# Patient Record
Sex: Female | Born: 1960 | Race: Black or African American | Hispanic: No | Marital: Married | Smoking: Current every day smoker
Health system: Southern US, Community
[De-identification: ages and names within clinical notes are randomized; demographics above are authoritative.]

## PROBLEM LIST (undated history)

## (undated) DIAGNOSIS — N189 Chronic kidney disease, unspecified: Secondary | ICD-10-CM

## (undated) DIAGNOSIS — D649 Anemia, unspecified: Secondary | ICD-10-CM

## (undated) DIAGNOSIS — I509 Heart failure, unspecified: Secondary | ICD-10-CM

## (undated) DIAGNOSIS — F32A Depression, unspecified: Secondary | ICD-10-CM

## (undated) DIAGNOSIS — I10 Essential (primary) hypertension: Secondary | ICD-10-CM

## (undated) DIAGNOSIS — E7800 Pure hypercholesterolemia, unspecified: Secondary | ICD-10-CM

## (undated) DIAGNOSIS — Z720 Tobacco use: Secondary | ICD-10-CM

## (undated) DIAGNOSIS — M1990 Unspecified osteoarthritis, unspecified site: Secondary | ICD-10-CM

## (undated) DIAGNOSIS — R519 Headache, unspecified: Secondary | ICD-10-CM

## (undated) DIAGNOSIS — M1120 Other chondrocalcinosis, unspecified site: Secondary | ICD-10-CM

## (undated) HISTORY — PX: OVARY SURGERY: SHX727

## (undated) HISTORY — PX: TUBAL LIGATION: SHX77

## (undated) HISTORY — DX: Essential (primary) hypertension: I10

## (undated) HISTORY — PX: CATARACT EXTRACTION: SUR2

## (undated) HISTORY — PX: ANTERIOR CRUCIATE LIGAMENT REPAIR: SHX115

## (undated) HISTORY — PX: RIGHT OOPHORECTOMY: SHX2359

## (undated) HISTORY — PX: SMALL INTESTINE SURGERY: SHX150

---

## 2000-03-20 ENCOUNTER — Encounter: Payer: Self-pay | Admitting: Orthopedic Surgery

## 2000-03-20 ENCOUNTER — Encounter: Admission: RE | Admit: 2000-03-20 | Discharge: 2000-03-20 | Payer: Self-pay | Admitting: Orthopedic Surgery

## 2010-07-11 ENCOUNTER — Encounter: Payer: Self-pay | Admitting: Family Medicine

## 2010-07-11 ENCOUNTER — Ambulatory Visit: Payer: Self-pay | Admitting: Family Medicine

## 2010-07-11 LAB — CONVERTED CEMR LAB
HCT: 39.2 % (ref 36.0–46.0)
Hemoglobin: 12.8 g/dL (ref 12.0–15.0)
Hgb A1c MFr Bld: 8.1 % — ABNORMAL HIGH (ref <5.7)
MCHC: 32.7 g/dL (ref 30.0–36.0)
MCV: 86.9 fL (ref 78.0–100.0)
Platelets: 297 10*3/uL (ref 150–400)
RBC: 4.51 M/uL (ref 3.87–5.11)
RDW: 14.4 % (ref 11.5–15.5)
TSH: 1.311 microintl units/mL (ref 0.350–4.500)
WBC: 10.2 10*3/uL (ref 4.0–10.5)

## 2010-07-18 ENCOUNTER — Ambulatory Visit (HOSPITAL_COMMUNITY): Admission: RE | Admit: 2010-07-18 | Payer: Self-pay | Source: Home / Self Care | Admitting: Family Medicine

## 2010-08-02 ENCOUNTER — Ambulatory Visit: Admit: 2010-08-02 | Payer: Self-pay | Admitting: Family Medicine

## 2010-08-19 ENCOUNTER — Encounter: Payer: Self-pay | Admitting: Family Medicine

## 2011-09-29 ENCOUNTER — Other Ambulatory Visit: Payer: Self-pay

## 2011-09-29 ENCOUNTER — Encounter (HOSPITAL_BASED_OUTPATIENT_CLINIC_OR_DEPARTMENT_OTHER): Payer: Self-pay | Admitting: *Deleted

## 2011-09-29 ENCOUNTER — Emergency Department (HOSPITAL_BASED_OUTPATIENT_CLINIC_OR_DEPARTMENT_OTHER)
Admission: EM | Admit: 2011-09-29 | Discharge: 2011-09-29 | Disposition: A | Discharge status: Home / Self Care | Payer: Self-pay | Attending: Emergency Medicine | Admitting: Emergency Medicine

## 2011-09-29 ENCOUNTER — Emergency Department (INDEPENDENT_AMBULATORY_CARE_PROVIDER_SITE_OTHER): Payer: Self-pay

## 2011-09-29 DIAGNOSIS — R197 Diarrhea, unspecified: Secondary | ICD-10-CM | POA: Insufficient documentation

## 2011-09-29 DIAGNOSIS — K219 Gastro-esophageal reflux disease without esophagitis: Secondary | ICD-10-CM

## 2011-09-29 DIAGNOSIS — R110 Nausea: Secondary | ICD-10-CM | POA: Insufficient documentation

## 2011-09-29 DIAGNOSIS — R040 Epistaxis: Secondary | ICD-10-CM | POA: Insufficient documentation

## 2011-09-29 DIAGNOSIS — R1915 Other abnormal bowel sounds: Secondary | ICD-10-CM | POA: Insufficient documentation

## 2011-09-29 DIAGNOSIS — E119 Type 2 diabetes mellitus without complications: Secondary | ICD-10-CM | POA: Insufficient documentation

## 2011-09-29 DIAGNOSIS — E7800 Pure hypercholesterolemia, unspecified: Secondary | ICD-10-CM | POA: Insufficient documentation

## 2011-09-29 DIAGNOSIS — R61 Generalized hyperhidrosis: Secondary | ICD-10-CM | POA: Insufficient documentation

## 2011-09-29 DIAGNOSIS — R739 Hyperglycemia, unspecified: Secondary | ICD-10-CM

## 2011-09-29 DIAGNOSIS — R079 Chest pain, unspecified: Secondary | ICD-10-CM

## 2011-09-29 DIAGNOSIS — Z79899 Other long term (current) drug therapy: Secondary | ICD-10-CM | POA: Insufficient documentation

## 2011-09-29 HISTORY — DX: Pure hypercholesterolemia, unspecified: E78.00

## 2011-09-29 LAB — URINALYSIS, ROUTINE W REFLEX MICROSCOPIC
Leukocytes, UA: NEGATIVE
Nitrite: POSITIVE — AB
Protein, ur: NEGATIVE mg/dL
Specific Gravity, Urine: 1.045 — ABNORMAL HIGH (ref 1.005–1.030)
Urobilinogen, UA: 0.2 mg/dL (ref 0.0–1.0)

## 2011-09-29 LAB — DIFFERENTIAL
Basophils Absolute: 0 10*3/uL (ref 0.0–0.1)
Basophils Relative: 0 % (ref 0–1)
Eosinophils Absolute: 0.1 10*3/uL (ref 0.0–0.7)
Monocytes Absolute: 0.6 10*3/uL (ref 0.1–1.0)
Monocytes Relative: 6 % (ref 3–12)
Neutro Abs: 5.9 10*3/uL (ref 1.7–7.7)
Neutrophils Relative %: 63 % (ref 43–77)

## 2011-09-29 LAB — TROPONIN I: Troponin I: <0.3 ng/mL (ref <0.30)

## 2011-09-29 LAB — CBC
Hemoglobin: 14.5 g/dL (ref 12.0–15.0)
MCH: 28.5 pg (ref 26.0–34.0)
MCHC: 34 g/dL (ref 30.0–36.0)
RDW: 14.1 % (ref 11.5–15.5)

## 2011-09-29 LAB — COMPREHENSIVE METABOLIC PANEL
CO2: 25 mEq/L (ref 19–32)
Calcium: 9.5 mg/dL (ref 8.4–10.5)
Creatinine, Ser: 0.6 mg/dL (ref 0.50–1.10)
GFR calc Af Amer: >90 mL/min (ref >90)
GFR calc non Af Amer: >90 mL/min (ref >90)
Glucose, Bld: 446 mg/dL — ABNORMAL HIGH (ref 70–99)
Total Protein: 7.4 g/dL (ref 6.0–8.3)

## 2011-09-29 LAB — GLUCOSE, CAPILLARY
Glucose-Capillary: 448 mg/dL — ABNORMAL HIGH (ref 70–99)
Glucose-Capillary: 310 mg/dL — ABNORMAL HIGH (ref 70–99)
Glucose-Capillary: 209 mg/dL — ABNORMAL HIGH (ref 70–99)

## 2011-09-29 LAB — URINE MICROSCOPIC-ADD ON

## 2011-09-29 IMAGING — CR DG CHEST 2V
2 series · 2 of 2 positions shown · non-contrast
Comparison: None.

CLINICAL DATA: Chest pain.  Hyperglycemia.  Diabetes.

CHEST - 2 VIEW

[w chest pa]
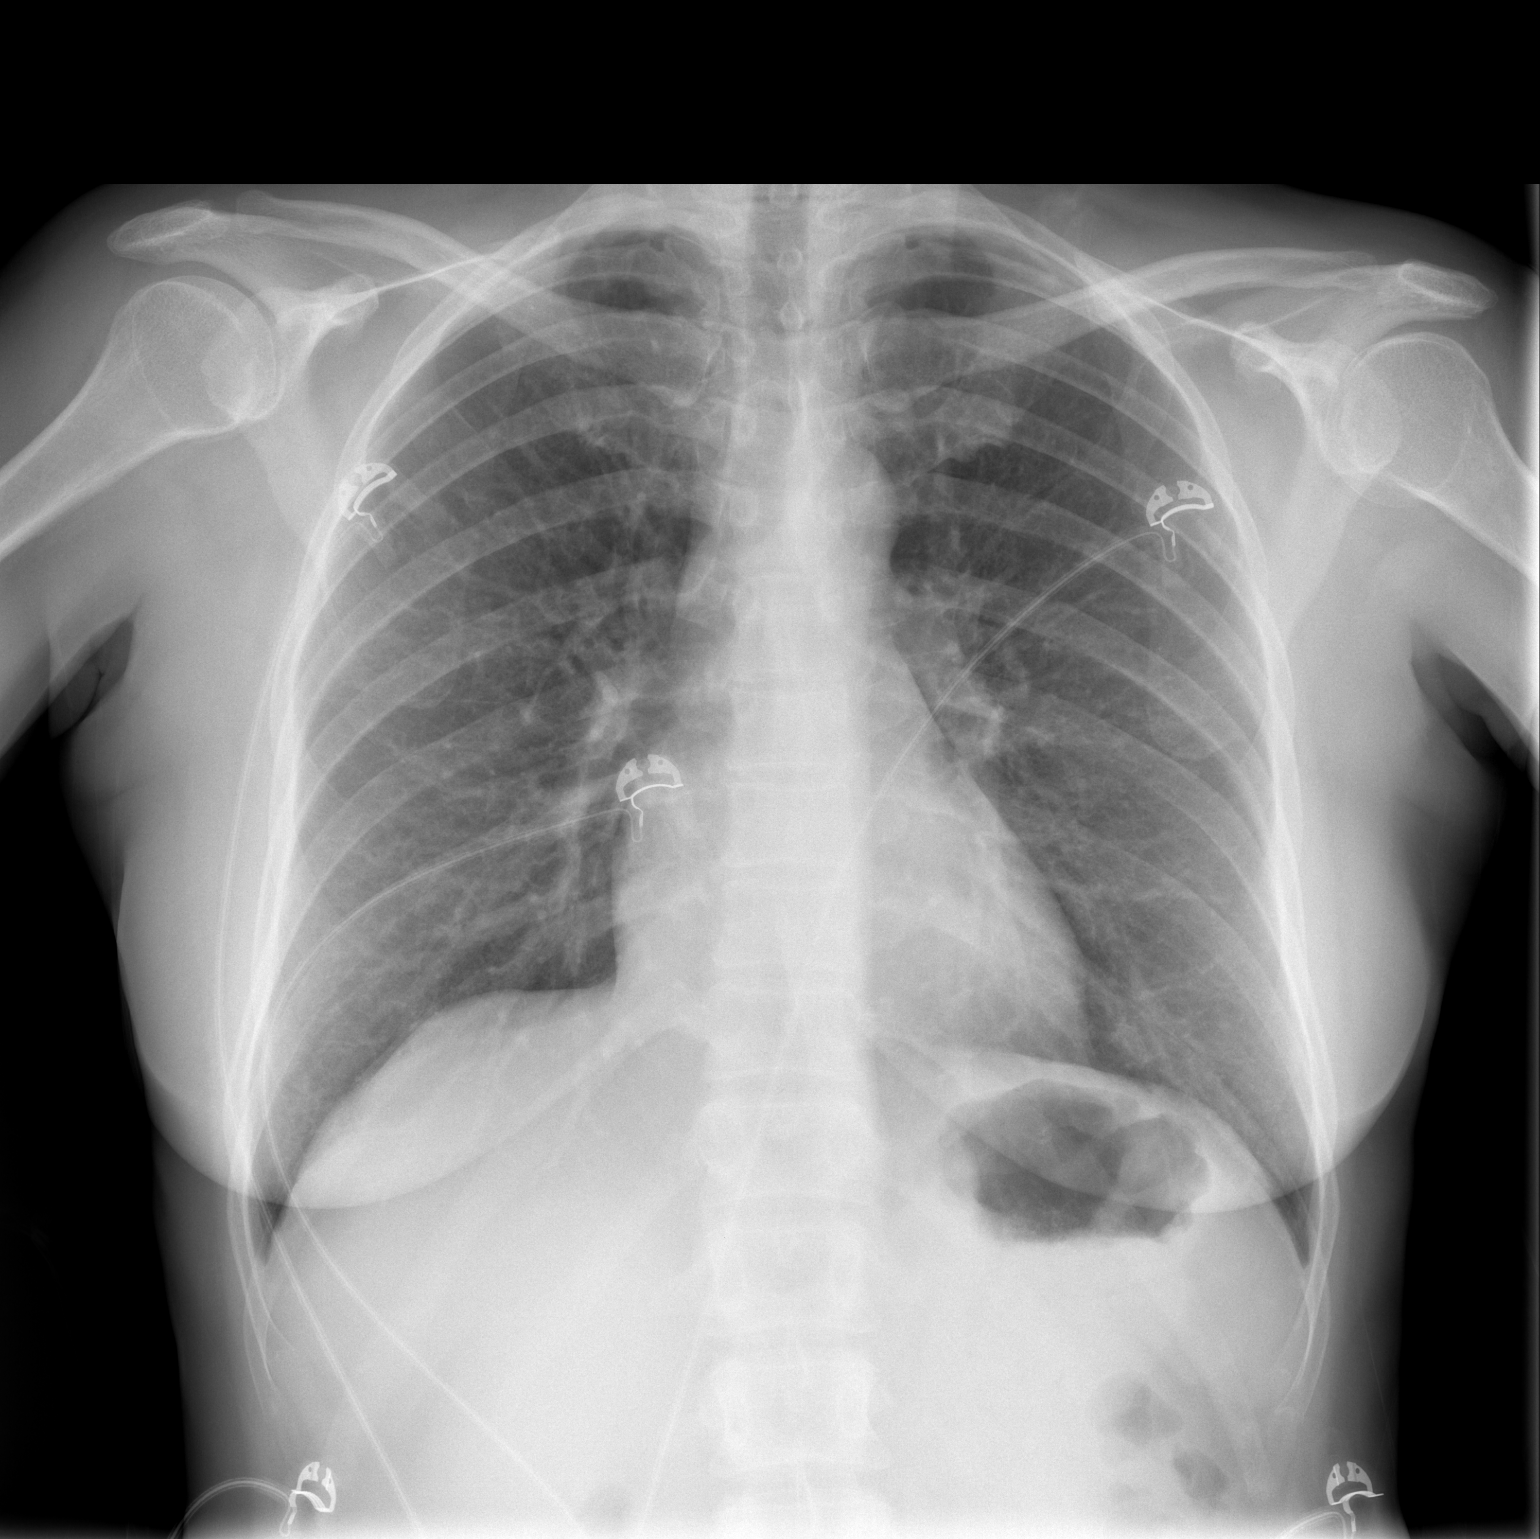

[w chest lat]
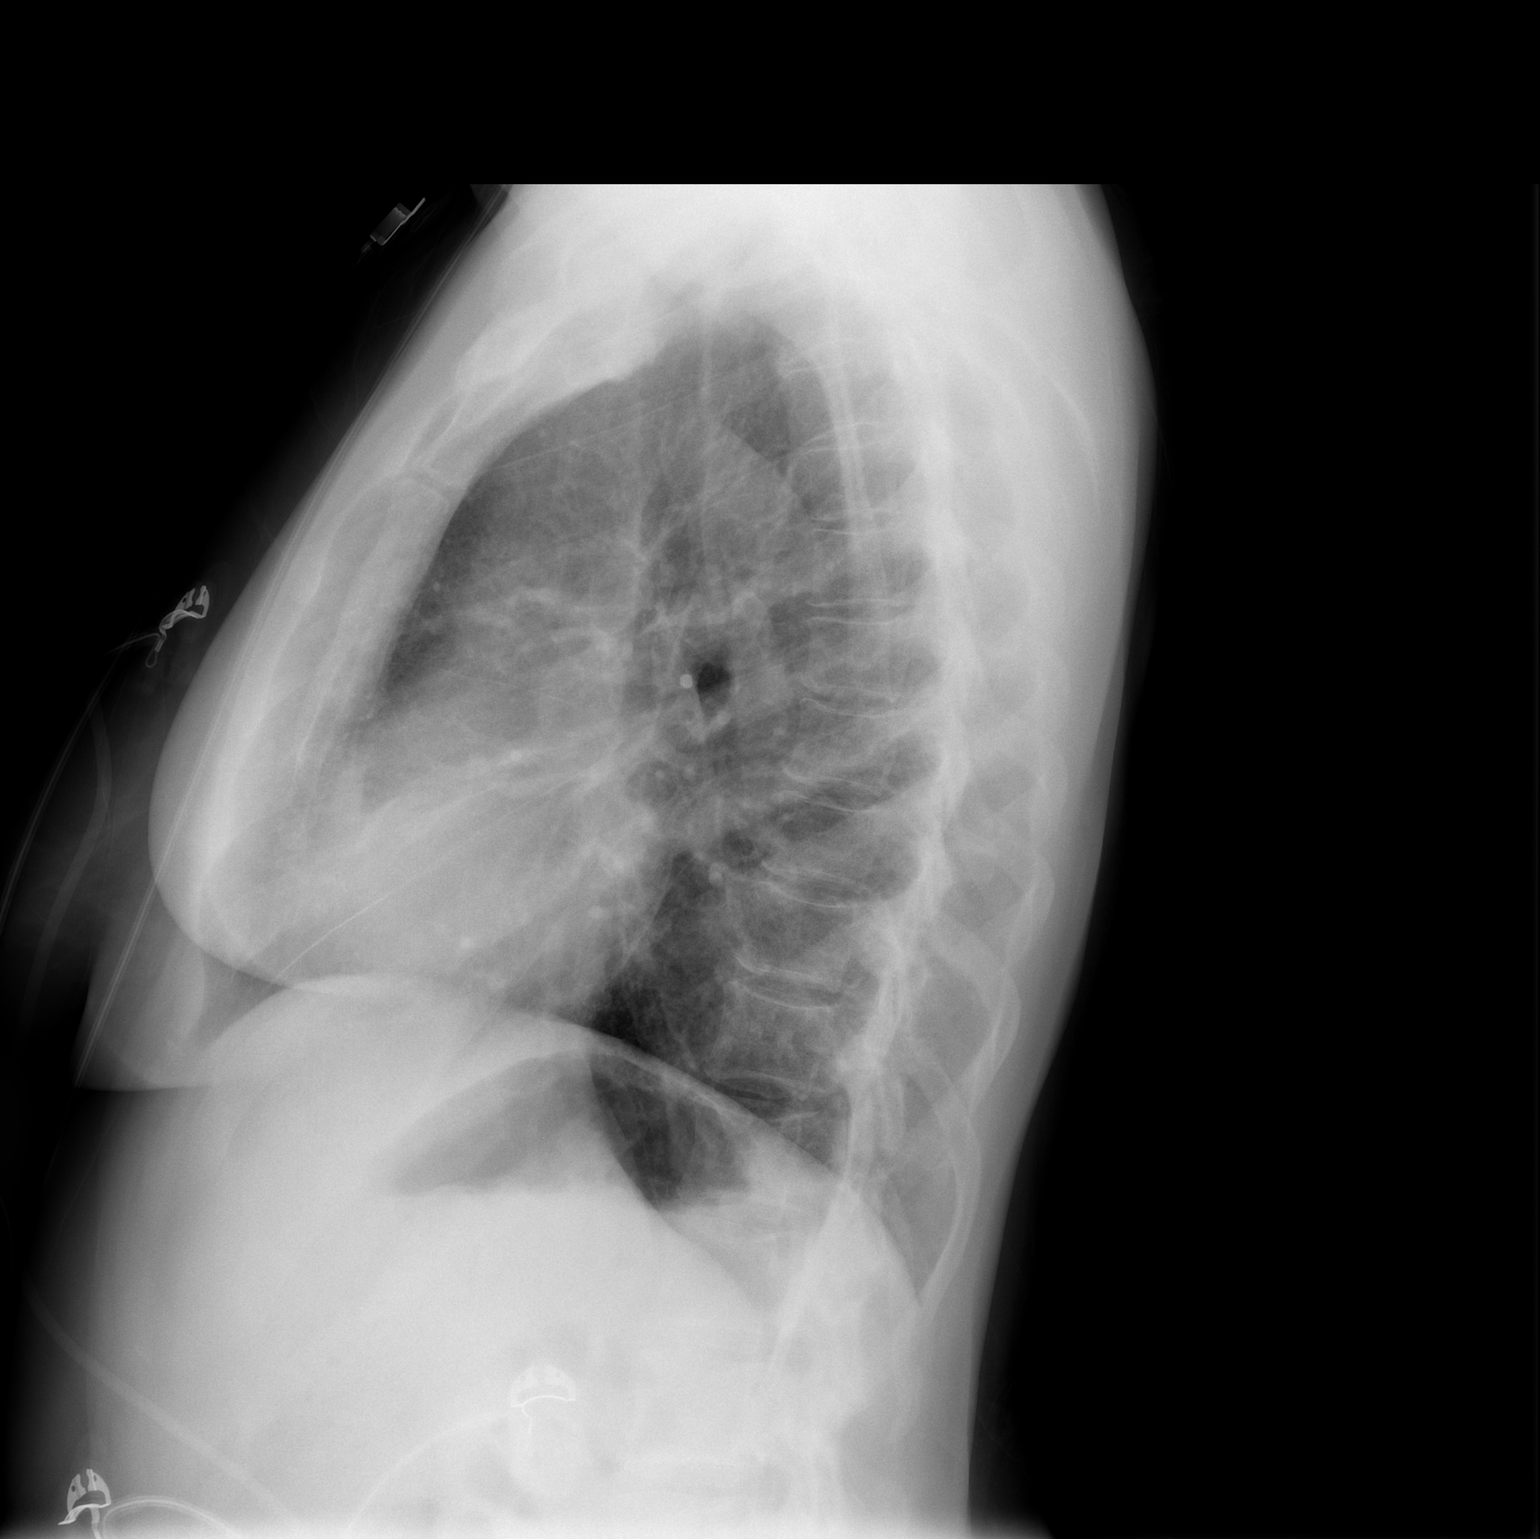

[2 of 2 positions shown; findings below may reference images not displayed]

FINDINGS: The heart size and mediastinal contours are normal. The
lungs are clear. There is no pleural effusion or pneumothorax. No
acute osseous findings are identified.  Telemetry leads overlie the
chest
IMPRESSION: No active cardiopulmonary process.

## 2011-09-29 MED ORDER — GI COCKTAIL ~~LOC~~
30.0000 mL | Freq: Once | ORAL | Status: AC
  Administered 2011-09-29: 30 mL via ORAL
  Filled 2011-09-29: qty 30

## 2011-09-29 MED ORDER — FAMOTIDINE IN NACL 20-0.9 MG/50ML-% IV SOLN
20.0000 mg | Freq: Once | INTRAVENOUS | Status: AC
  Administered 2011-09-29: 20 mg via INTRAVENOUS
  Filled 2011-09-29: qty 50

## 2011-09-29 MED ORDER — SODIUM CHLORIDE 0.9 % IV BOLUS (SEPSIS)
1000.0000 mL | Freq: Once | INTRAVENOUS | Status: AC
  Administered 2011-09-29: 1000 mL via INTRAVENOUS

## 2011-09-29 MED ORDER — ONDANSETRON HCL 8 MG PO TABS: 8.0000 mg | ORAL_TABLET | Freq: Three times a day (TID) | ORAL | Status: AC | PRN

## 2011-09-29 MED ORDER — FAMOTIDINE 20 MG PO TABS: 20.0000 mg | ORAL_TABLET | Freq: Two times a day (BID) | ORAL | Status: DC | PRN

## 2011-09-29 MED ORDER — SODIUM CHLORIDE 0.9 % IV SOLN: INTRAVENOUS | Status: DC

## 2011-09-29 MED ORDER — SODIUM CHLORIDE 0.9 % IV SOLN
INTRAVENOUS | Status: DC
  Administered 2011-09-29: 2.5 [IU]/h via INTRAVENOUS
  Filled 2011-09-29: qty 100
  Filled 2011-09-29: qty 6

## 2011-09-29 MED ORDER — ASPIRIN 81 MG PO CHEW
324.0000 mg | CHEWABLE_TABLET | Freq: Once | ORAL | Status: AC
  Administered 2011-09-29: 324 mg via ORAL
  Filled 2011-09-29: qty 4

## 2011-09-29 MED ORDER — METFORMIN HCL 500 MG PO TABS: 500.0000 mg | ORAL_TABLET | Freq: Two times a day (BID) | ORAL | Status: DC

## 2011-09-29 MED ORDER — ONDANSETRON HCL 4 MG/2ML IJ SOLN
4.0000 mg | Freq: Once | INTRAMUSCULAR | Status: AC
  Administered 2011-09-29: 4 mg via INTRAVENOUS
  Filled 2011-09-29: qty 2

## 2011-09-29 NOTE — ED Notes (Signed)
Patient transported to X-ray 

## 2011-09-29 NOTE — ED Provider Notes (Signed)
History   This chart was scribed for Charlena Cross, MD by Malen Gauze. The patient was seen in room [place] and the patient's care was started at 4:40PM.    CSN: [id number]  Arrival date & time 09/29/11  1537   First MD Initiated Contact with Patient 09/29/11 1625      Chief Complaint  Patient presents with  . Chest Pain    (Consider location/radiation/quality/duration/timing/severity/associated sxs/prior treatment) HPI Amber Cross is a 51 y.o. female who presents to the Emergency Department complaining of constant, stabbing, burning, non-radiating, moderate to severe chest pain with associated acid-reflux symptoms with an onset noon yesterday. Drinking soda alleviated the pain, but it came back soon after. Slight diaphoresis, nausea, diarrhea, epistaxis (this morning). No HA, SOB, abd pain, vomit, hematochezia, or extremity pain. Pt has a Hx of diabetes mellitus but has been without meds or f/u for 2-3 yrs; pt ran out of metformin 2-3 years ago. No known allergies to medications. No other pertinent medical symptoms.  Past Medical History  Diagnosis Date  . Diabetes mellitus   . Hypercholesteremia     Past Surgical History  Procedure Date  . Ovary surgery     History reviewed. No pertinent family history.  History  Substance Use Topics  . Smoking status: Current Some Day Smoker  . Smokeless tobacco: Not on file  . Alcohol Use: No    OB History    Grav Para Term Preterm Abortions TAB SAB Ect Mult Living                  Review of Systems 10 Systems reviewed and are negative for acute change except as noted in the HPI.  Allergies  Review of patient's allergies indicates no known allergies.  Home Medications   Current Outpatient Rx  Name Route Sig Dispense Refill  . BC HEADACHE POWDER PO Oral Take 1 packet by mouth once.    . METFORMIN HCL 500 MG PO TABS Oral Take 500 mg by mouth once.      BP 124/61  Pulse 66  Temp(Src) 98.2 F (36.8 C) (Oral)   Resp 20  Ht 5' 7.5" (1.715 m)  Wt 164 lb (74.39 kg)  BMI 25.31 kg/m2  SpO2 98%  LMP 09/29/2011  Physical Exam  Nursing note and vitals reviewed. Constitutional: She is oriented to person, place, and time. She appears well-developed and well-nourished. No distress.       Awake, alert, nontoxic appearance.  HENT:  Head: Normocephalic and atraumatic.  Right Ear: External ear normal.  Left Ear: External ear normal.  Mouth/Throat: Oropharynx is clear and moist.  Eyes: Conjunctivae and EOM are normal. Pupils are equal, round, and reactive to light. Right eye exhibits no discharge. Left eye exhibits no discharge.  Neck: Normal range of motion. Neck supple.       No jugular vein distension.  Cardiovascular: Normal rate, regular rhythm, normal heart sounds and intact distal pulses.  Exam reveals no gallop and no friction rub.   No murmur heard. Pulmonary/Chest: Effort normal and breath sounds normal. No respiratory distress. She has no wheezes. She has no rales. She exhibits no tenderness.       Good air exchange.  Abdominal: Soft. She exhibits no distension. There is no tenderness. There is no rebound and no guarding.       Hypoactive bowel sounds.  Musculoskeletal: Normal range of motion. She exhibits no edema and no tenderness.       Baseline ROM, no obvious  new focal weakness.  Lymphadenopathy:    She has no cervical adenopathy.  Neurological: She is alert and oriented to person, place, and time.       Mental status and motor strength appears baseline for patient and situation.  Skin: Skin is warm. No rash noted. She is diaphoretic (Slightly).  Psychiatric: She has a normal mood and affect. Her behavior is normal.    ED Course  Procedures (including critical care time)   Date: 09/29/2011  Rate: 88  Rhythm: normal sinus rhythm  QRS Axis: normal  Intervals: normal  ST/T Wave abnormalities: flattened t waves diffusely  Conduction Disutrbances: none  Narrative Interpretation:  Non-provocative EKG  Old EKG Reviewed: Unavailable     DIAGNOSTIC STUDIES: Oxygen Saturation is 98% on room air, normal by my interpretation.    COORDINATION OF CARE:  4:45PM -  EDMD will prescribe more metformin and order insulin. EDMD will also give a list of MDs for pt to f/u with for diabetes, especially ones without preference to lack of insurance.  Results for orders placed during the hospital encounter of 09/29/11  GLUCOSE, CAPILLARY      Component Value Range   Glucose-Capillary 448 (*) 70 - 99 (mg/dL)  TROPONIN I      Component Value Range   Troponin I <0.30  <0.30 (ng/mL)  CBC      Component Value Range   WBC 9.5  4.0 - 10.5 (K/uL)   RBC 5.09  3.87 - 5.11 (MIL/uL)   Hemoglobin 14.5  12.0 - 15.0 (g/dL)   HCT 42.6  36.0 - 46.0 (%)   MCV 83.7  78.0 - 100.0 (fL)   MCH 28.5  26.0 - 34.0 (pg)   MCHC 34.0  30.0 - 36.0 (g/dL)   RDW 14.1  11.5 - 15.5 (%)   Platelets 296  150 - 400 (K/uL)  DIFFERENTIAL      Component Value Range   Neutrophils Relative 63  43 - 77 (%)   Neutro Abs 5.9  1.7 - 7.7 (K/uL)   Lymphocytes Relative 30  12 - 46 (%)   Lymphs Abs 2.9  0.7 - 4.0 (K/uL)   Monocytes Relative 6  3 - 12 (%)   Monocytes Absolute 0.6  0.1 - 1.0 (K/uL)   Eosinophils Relative 1  0 - 5 (%)   Eosinophils Absolute 0.1  0.0 - 0.7 (K/uL)   Basophils Relative 0  0 - 1 (%)   Basophils Absolute 0.0  0.0 - 0.1 (K/uL)   WBC Morphology VACUOLATED NEUTROPHILS     RBC Morphology TEARDROP CELLS    COMPREHENSIVE METABOLIC PANEL      Component Value Range   Sodium 135  135 - 145 (mEq/L)   Potassium 3.6  3.5 - 5.1 (mEq/L)   Chloride 97  96 - 112 (mEq/L)   CO2 25  19 - 32 (mEq/L)   Glucose, Bld 446 (*) 70 - 99 (mg/dL)   BUN 7  6 - 23 (mg/dL)   Creatinine, Ser 0.60  0.50 - 1.10 (mg/dL)   Calcium 9.5  8.4 - 10.5 (mg/dL)   Total Protein 7.4  6.0 - 8.3 (g/dL)   Albumin 3.6  3.5 - 5.2 (g/dL)   AST 23  0 - 37 (U/L)   ALT 26  0 - 35 (U/L)   Alkaline Phosphatase 142 (*) 39 - 117 (U/L)     Total Bilirubin 0.2 (*) 0.3 - 1.2 (mg/dL)   GFR calc non Af Amer >90  >90 (  mL/min)   GFR calc Af Amer >90  >90 (mL/min)  LIPASE, BLOOD      Component Value Range   Lipase 31  11 - 59 (U/L)  URINALYSIS, ROUTINE W REFLEX MICROSCOPIC      Component Value Range   Color, Urine YELLOW  YELLOW    APPearance CLOUDY (*) CLEAR    Specific Gravity, Urine 1.045 (*) 1.005 - 1.030    pH 5.5  5.0 - 8.0    Glucose, UA >1000 (*) NEGATIVE (mg/dL)   Hgb urine dipstick LARGE (*) NEGATIVE    Bilirubin Urine NEGATIVE  NEGATIVE    Ketones, ur NEGATIVE  NEGATIVE (mg/dL)   Protein, ur NEGATIVE  NEGATIVE (mg/dL)   Urobilinogen, UA 0.2  0.0 - 1.0 (mg/dL)   Nitrite POSITIVE (*) NEGATIVE    Leukocytes, UA NEGATIVE  NEGATIVE   GLUCOSE, CAPILLARY      Component Value Range   Glucose-Capillary 310 (*) 70 - 99 (mg/dL)   Comment 1 Documented in Chart     Comment 2 Notify RN    GLUCOSE, CAPILLARY      Component Value Range   Glucose-Capillary 286 (*) 70 - 99 (mg/dL)   Comment 1 Notify RN     Comment 2 Documented in Chart    URINE MICROSCOPIC-ADD ON      Component Value Range   Squamous Epithelial / LPF FEW (*) RARE    WBC, UA 3-6  <3 (WBC/hpf)   RBC / HPF 3-6  <3 (RBC/hpf)   Bacteria, UA MANY (*) RARE     Dg Chest 2 View  09/29/2011  *RADIOLOGY REPORT*  Clinical Data: Chest pain.  Hyperglycemia.  Diabetes.  CHEST - 2 VIEW  Comparison: None.  Findings: The heart size and mediastinal contours are normal. The lungs are clear. There is no pleural effusion or pneumothorax. No acute osseous findings are identified.  Telemetry leads overlie the chest  IMPRESSION: No active cardiopulmonary process.  Original Report Authenticated By: Vivia Ewing, M.D.     No diagnosis found.    MDM   4:48PM - Hyperglycemia due to diabetes mellitus, likely gastroparesis with GERD, symptoms very atypical with myocardial infarction, with no Hx of CAD; likely no myocardial ischemia or infarction.  8:42 PM Labs reviewed,  the patient has hyperglycemia without suggestion of ketoacidosis, normal bicarbonate, no significant anion gap, otherwise appropriate electrolytes and renal function. There is no indication of acute cholecystitis, pancreatitis, myocardial infarction or ischemia on the patient's physical examination, or in the laboratory results. I perceive that the process at play is hyperglycemia and T2 untreated diabetes mellitus (for the last 2 years) and likely gastroparesis. We will treat the hyperglycemia with IV fluids and IV insulin to correct it, I will start patient back on metformin, and direct her to followup with primary care. The patient states her understanding of and agreement with the plan of care.  I personally performed the services described in this documentation, which was scribed in my presence. The recorded information has been reviewed and considered.        Charlena Cross, MD 09/29/11 2132

## 2011-09-29 NOTE — ED Notes (Signed)
accucheck glucose 448

## 2011-09-29 NOTE — ED Notes (Signed)
Requesting something to drink. Given water. OK per Dr. Windle Guard.

## 2011-09-29 NOTE — ED Notes (Signed)
Last metformin taken two days ago & does not take on a regular basis because she has run out of meds

## 2011-09-29 NOTE — ED Notes (Addendum)
Pt states she was lying down yesterday and began to have midsternal CP with nausea, diaphoresis and numbness to her right hand. Drank a soda which "made me belch", but it did not help. Felt worse today. Also had a nosebleed this a.m. Hx diabetes and FSBS was 430 this a.m. No period for 5 months and this month started passing clots.

## 2011-09-29 NOTE — ED Notes (Signed)
CBG on the patient was 310.

## 2011-09-29 NOTE — ED Notes (Signed)
Insulin drip started at 2.5units/hour

## 2011-09-29 NOTE — Discharge Instructions (Signed)
Blood Sugar Monitoring, Adult GLUCOSE METERS FOR SELF-MONITORING OF BLOOD GLUCOSE  It is important to be able to correctly measure your blood sugar (glucose). You can use a blood glucose monitor (a small battery-operated device) to check your glucose level at any time. This allows you and your caregiver to monitor your diabetes and to determine how well your treatment plan is working. The process of monitoring your blood glucose with a glucose meter is called self-monitoring of blood glucose (SMBG). When people with diabetes control their blood sugar, they have better health. To test for glucose with a typical glucose meter, place the disposable strip in the meter. Then place a small sample of blood on the "test strip." The test strip is coated with chemicals that combine with glucose in blood. The meter measures how much glucose is present. The meter displays the glucose level as a number. Several new models can record and store a number of test results. Some models can connect to personal computers to store test results or print them out.  Newer meters are often easier to use than older models. Some meters allow you to get blood from places other than your fingertip. Some new models have automatic timing, error codes, signals, or barcode readers to help with proper adjustment (calibration). Some meters have a large display screen or spoken instructions for people with visual impairments.  INSTRUCTIONS FOR USING GLUCOSE METERS  Wash your hands with soap and warm water, or clean the area with alcohol. Dry your hands completely.   Prick the side of your fingertip with a lancet (a sharp-pointed tool used by hand).   Hold the hand down and gently milk the finger until a small drop of blood appears. Catch the blood with the test strip.   Follow the instructions for inserting the test strip and using the SMBG meter. Most meters require the meter to be turned on and the test strip to be inserted before  applying the blood sample.   Record the test result.   Read the instructions carefully for both the meter and the test strips that go with it. Meter instructions are found in the user manual. Keep this manual to help you solve any problems that may arise. Many meters use "error codes" when there is a problem with the meter, the test strip, or the blood sample on the strip. You will need the manual to understand these error codes and fix the problem.   New devices are available such as laser lancets and meters that can test blood taken from "alternative sites" of the body, other than fingertips. However, you should use standard fingertip testing if your glucose changes rapidly. Also, use standard testing if:   You have eaten, exercised, or taken insulin in the past 2 hours.   You think your glucose is low.   You tend to not feel symptoms of low blood glucose (hypoglycemia).   You are ill or under stress.   Clean the meter as directed by the manufacturer.   Test the meter for accuracy as directed by the manufacturer.   Take your meter with you to your caregiver's office. This way, you can test your glucose in front of your caregiver to make sure you are using the meter correctly. Your caregiver can also take a sample of blood to test using a routine lab method. If values on the glucose meter are close to the lab results, you and your caregiver will see that your meter is working well  and you are using good technique. Your caregiver will advise you about what to do if the results do not match.  FREQUENCY OF TESTING  Your caregiver will tell you how often you should check your blood glucose. This will depend on your type of diabetes, your current level of diabetes control, and your types of medicines. The following are general guidelines, but your care plan may be different. Record all your readings and the time of day you took them for review with your caregiver.   Diabetes type 1.   When you  are using insulin with good diabetic control (either multiple daily injections or via a pump), you should check your glucose 4 times a day.   If your diabetes is not well controlled, you may need to monitor more frequently, including before meals and 2 hours after meals, at bedtime, and occasionally between 2 a.m. and 3 a.m.   You should always check your glucose before a dose of insulin or before changing the rate on your insulin pump.   Diabetes type 2.   Guidelines for SMBG in diabetes type 2 are not as well defined.   If you are on insulin, follow the guidelines above.   If you are on medicines, but not insulin, and your glucose is not well controlled, you should test at least twice daily.   If you are not on insulin, and your diabetes is controlled with medicines or diet alone, you should test at least once daily, usually before breakfast.   A weekly profile will help your caregiver advise you on your care plan. The week before your visit, check your glucose before a meal and 2 hours after a meal at least daily. You may want to test before and after a different meal each day so you and your caregiver can tell how well controlled your blood sugars are throughout the course of a 24 hour period.   Gestational diabetes (diabetes during pregnancy).   Frequent testing is often necessary. Accurate timing is important.   If you are not on insulin, check your glucose 4 times a day. Check it before breakfast and 1 hour after the start of each meal.   If you are on insulin, check your glucose 6 times a day. Check it before each meal and 1 hour after the first bite of each meal.   General guidelines.   More frequent testing is required at the start of insulin treatment. Your caregiver will instruct you.   Test your glucose any time you suspect you have low blood sugar (hypoglycemia).   You should test more often when you change medicines, when you have unusual stress or illness, or in other  unusual circumstances.  OTHER THINGS TO KNOW ABOUT GLUCOSE METERS  Measurement Range. Most glucose meters are able to read glucose levels over a broad range of values from as low as 0 to as high as 600 mg/dL. If you get an extremely high or low reading from your meter, you should first confirm it with another reading. Report very high or very low readings to your caregiver.   Whole Blood Glucose versus Plasma Glucose. Some older home glucose meters measure glucose in your whole blood. In a lab or when using some newer home glucose meters, the glucose is measured in your plasma (one component of blood). The difference can be important. It is important for you and your caregiver to know whether your meter gives its results as "whole blood equivalent" or "plasma  equivalent."   Display of High and Low Glucose Values. Part of learning how to operate a meter is understanding what the meter results mean. Know how high and low glucose concentrations are displayed on your meter.   Factors that Affect Glucose Meter Performance. The accuracy of your test results depends on many factors and varies depending on the brand and type of meter. These factors include:   Low red blood cell count (anemia).   Substances in your blood (such as uric acid, vitamin C, and others).   Environmental factors (temperature, humidity, altitude).   Name-brand versus generic test strips.   Calibration. Make sure your meter is set up properly. It is a good idea to do a calibration test with a control solution recommended by the manufacturer of your meter whenever you begin using a fresh bottle of test strips. This will help verify the accuracy of your meter.   Improperly stored, expired, or defective test strips. Keep your strips in a dry place with the lid on.   Soiled meter.   Inadequate blood sample.  NEW TECHNOLOGIES FOR GLUCOSE TESTING Alternative site testing Some glucose meters allow testing blood from alternative  sites. These include the:  Upper arm.   Forearm.   Base of the thumb.   Thigh.  Sampling blood from alternative sites may be desirable. However, it may have some limitations. Blood in the fingertips show changes in glucose levels more quickly than blood in other parts of the body. This means that alternative site test results may be different from fingertip test results, not because of the meter's ability to test accurately, but because the actual glucose concentration can be different.  Continuous Glucose Monitoring Devices to measure your blood glucose continuously are available, and others are in development. These methods can be more expensive than self-monitoring with a glucose meter. However, it is uncertain how effective and reliable these devices are. Your caregiver will advise you if this approach makes sense for you. IF BLOOD SUGARS ARE CONTROLLED, PEOPLE WITH DIABETES REMAIN HEALTHIER.  SMBG is an important part of the treatment plan of patients with diabetes mellitus. Below are reasons for using SMBG:   It confirms that your glucose is at a specific, healthy level.   It detects hypoglycemia and severe hyperglycemia.   It allows you and your caregiver to make adjustments in response to changes in lifestyle for individuals requiring medicine.   It determines the need for starting insulin therapy in temporary diabetes that happens during pregnancy (gestational diabetes).  Document Released: 07/18/2003 Document Revised: 07/04/2011 Document Reviewed: 11/08/2010 [facility] Patient Information 2012 [place].Diabetes and Exercise Regular exercise is important and can help:   Control blood glucose (sugar).   Decrease blood pressure.    Control blood lipids (cholesterol, triglycerides).   Improve overall health.  BENEFITS FROM EXERCISE  Improved fitness.   Improved flexibility.   Improved endurance.   Increased bone density.   Weight control.   Increased muscle  strength.   Decreased body fat.   Improvement of the body's use of insulin, a hormone.   Increased insulin sensitivity.   Reduction of insulin needs.   Reduced stress and tension.   Helps you feel better.  People with diabetes who add exercise to their lifestyle gain additional benefits, including:  Weight loss.   Reduced appetite.   Improvement of the body's use of blood glucose.   Decreased risk factors for heart disease:   Lowering of cholesterol and triglycerides.   Raising the  level of good cholesterol (high-density lipoproteins, HDL).   Lowering blood sugar.   Decreased blood pressure.  TYPE 1 DIABETES AND EXERCISE  Exercise will usually lower your blood glucose.   If blood glucose is greater than 240 mg/dl, check urine ketones. If ketones are present, do not exercise.   Location of the insulin injection sites may need to be adjusted with exercise. Avoid injecting insulin into areas of the body that will be exercised. For example, avoid injecting insulin into:   The arms when playing tennis.   The legs when jogging. For more information, discuss this with your caregiver.   Keep a record of:   Food intake.   Type and amount of exercise.   Expected peak times of insulin action.   Blood glucose levels.  Do this before, during, and after exercise. Review your records with your caregiver. This will help you to develop guidelines for adjusting food intake and insulin amounts.  TYPE 2 DIABETES AND EXERCISE  Regular physical activity can help control blood glucose.   Exercise is important because it may:   Increase the body's sensitivity to insulin.   Improve blood glucose control.   Exercise reduces the risk of heart disease. It decreases serum cholesterol and triglycerides. It also lowers blood pressure.   Those who take insulin or oral hypoglycemic agents should watch for signs of hypoglycemia. These signs include dizziness, shaking, sweating, chills,  and confusion.   Body water is lost during exercise. It must be replaced. This will help to avoid loss of body fluids (dehydration) or heat stroke.  Be sure to talk to your caregiver before starting an exercise program to make sure it is safe for you. Remember, any activity is better than none.  Document Released: 10/05/2003 Document Revised: 07/04/2011 Document Reviewed: 01/19/2009 [facility] Patient Information 2012 [place].Diabetes and Standards of Medical Care  Diabetes is complicated. You may find that your diabetes team includes a dietitian, nurse, diabetes educator, eye doctor, and more. To help everyone know what is going on and to help you get the care you deserve, the following schedule of care was developed to help keep you on track. Below are the tests, exams, vaccines, medicines, education, and plans you will need. A1c test  Performed at least 2 times a year if you are meeting treatment goals.   Performed 4 times a year if therapy has changed or if you are not meeting therapy/glycemic goals.  Aspirin medicine  Take daily as directed by your caregiver.  Blood pressure test  Performed at every routine medical visit. The goal is less than 130/80 mm/Hg.  Dental exam  Get a dental exam at least 2 times a year.  Dilated eye exam (retinal exam)  Type 1 diabetes: Get an exam within 5 years of diagnosis and then yearly.   Type 2 diabetes: Get an exam at diagnosis and then yearly.  All exams thereafter can be extended to every 2 to 3 years if one or more exams have been normal. Foot care exam  Visual foot exams are performed at every routine medical visit. The exams check for cuts, injuries, or other problems with the feet.   A comprehensive foot exam should be done yearly. This includes visual inspection as well as assessing foot pulses and testing for loss of sensation.  Kidney function test (urine microalbumin)  Performed once a year.   Type 1 diabetes: The first test  is performed 5 years after diagnosis.   Type 2 diabetes:  The first test is performed at the time of diagnosis.   A serum creatinine and estimated glomerular filtration rate (eGFR) test is done once a year to tell the level of chronic kidney disease (CKD), if present.  Lipid profile (Cholesterol, HDL, LDL, Triglycerides)  Performed once a year for most people. If at low risk, may be assessed every 2 years.   The goal for LDL is less than 100 mg/dl. If at high risk, the goal is less than 70 mg/dl.   The goal for HDL is higher than 40 mg/dl for men and higher than 50 mg/dl for women.   The goal for triglycerides is less than 150 mg/dl.  Flu vaccine, pneumonia vaccine, and hepatitis B vaccine  The flu vaccine is recommended yearly.   The pneumonia vaccine is generally given once in a lifetime. However, there are some instances where another vaccine is recommended. Check with your caregiver.   The hepatitis B vaccine is also recommended for adults with diabetes.  Diabetes self-management education  Recommended at diagnosis and ongoing as needed.  Treatment plan  Reviewed at every medical visit.  Document Released: 05/12/2009 Document Revised: 07/04/2011 Document Reviewed: 01/15/2011 [facility] Patient Information 2012 [place].Diabetes, Eating Away From Home Sometimes, you might eat in a restaurant or have meals that are prepared by someone else. You can enjoy eating out. However, the portions in restaurants may be much larger than needed. Listed below are some ideas to help you choose foods that will keep your blood glucose (sugar) in better control.  TIPS FOR EATING OUT  Know your meal plan and how many carbohydrate servings you should have at each meal. You may wish to carry a copy of your meal plan in your purse or wallet. Learn the foods included in each food group.   Make a list of restaurants near you that offer healthy choices. Take a copy of the carry-out menus to see what  they offer. Then, you can plan what you will order ahead of time.   Become familiar with serving sizes by practicing them at home using measuring cups and spoons. Once you learn to recognize portion sizes, you will be able to correctly estimate the amount of total carbohydrate you are allowed to eat at the restaurant. Ask for a takeout box if the portion is more than you should have. When your food comes, leave the amount you should have on the plate, and put the rest in the takeout box before you start eating.   Plan ahead if your mealtime will be different from usual. Check with your caregiver to find out how to time meals and medicine if you are taking insulin.   Avoid high-fat foods, such as fried foods, cream sauces, high-fat salad dressings, or any added butter or margarine.   Do not be afraid to ask questions. Ask your server about the portion size, cooking methods, ingredients and if items can be substituted. Restaurants do not list all available items on the menu. You can ask for your main entree to be prepared using skim milk, oil instead of butter or margarine, and without gravy or sauces. Ask your waiter or waitress to serve salad dressings, gravy, sauces, margarine, and sour cream on the side. You can then add the amount your meal plan suggests.   Add more vegetables whenever possible.   Avoid items that are labeled "jumbo," "giant," "deluxe," or "supersized."   You may want to split an entre with someone and order an extra  side salad.   Watch for hidden calories in foods like croutons, bacon, or cheese.   Ask your server to take away the bread basket or chips from your table.   Order a dinner salad as an appetizer.  You can eat most foods served in a restaurant. Some foods are better choices than others. Breads and Starches  Recommended: All kinds of bread (wheat, rye, white, oatmeal, [place], [place], raisin), hard or soft dinner rolls, frankfurter or hamburger buns, small  bagels, small corn or whole-wheat flour tortillas.   Avoid: Frosted or glazed breads, butter rolls, egg or cheese breads, croissants, sweet rolls, pastries, coffee cake, glazed or frosted doughnuts, muffins.  Crackers  Recommended: Animal crackers, graham, rye, saltine, oyster, and matzoth crackers. Bread sticks, melba toast, rusks, pretzels, popcorn (without fat), zwieback toast.   Avoid: High-fat snack crackers or chips. Buttered popcorn.  Cereals  Recommended: Hot and cold cereals. Whole grains such as oatmeal or shredded wheat are good choices.   Avoid: Sugar-coated or granola type cereals.  Potatoes/Pasta/Rice/Beans  Recommended: Order baked, boiled, or mashed potatoes, rice or noodles without added fat, whole beans. Order gravies, butter, margarine, or sauces on the side so you can control the amount you add.   Avoid: Hash browns or fried potatoes. Potatoes, pasta, or rice prepared with cream or cheese sauce. Potato or pasta salads prepared with large amounts of dressing. Fried beans or fried rice.  Vegetables  Recommended: Order steamed, baked, boiled, or stewed vegetables without sauces or extra fat. Ask that sauce be served on the side. If vegetables are not listed on the menu, ask what is available.   Avoid: Vegetables prepared with cream, butter, or cheese sauce. Fried vegetables.  Salad Bars  Recommended: Many of the vegetables at a salad bar are considered "free." Use lemon juice, vinegar, or low-calorie salad dressing (fewer than 20 calories per serving) as "free" dressings for your salad. Look for salad bar ingredients that have no added fat or sugar such as tomatoes, lettuce, cucumbers, broccoli, carrots, onions, and mushrooms.   Avoid: Prepared salads with large amounts of dressing, such as coleslaw, caesar salad, macaroni salad, bean salad, or carrot salad.  Fruit  Recommended: Eat fresh fruit or fresh fruit salad without added dressing. A salad bar often offers fresh  fruit choices, but canned fruit at a restaurant is usually packed in sugar or syrup.   Avoid: Sweetened canned or frozen fruits, plain or sweetened fruit juice. Fruit salads with dressing, sour cream, or sugar added to them.  Meat and Meat Substitutes  Recommended: Order broiled, baked, roasted, or grilled meat, poultry, or fish. Trim off all visible fat. Do not eat the skin of poultry. The size stated on the menu is the raw weight. Meat shrinks by  in cooking (for example, 4 oz raw equals 3 oz cooked meat).   Avoid: Deep-fat fried meat, poultry, or fish. Breaded meats.  Eggs  Recommended: Order soft, hard-cooked, poached, or scrambled eggs. Omelets may be okay, depending on what ingredients are added. Egg substitutes are also a good choice.   Avoid: Fried eggs, eggs prepared with cream or cheese sauce.  Milk  Recommended: Order low-fat or fat-free milk according to your meal plan. Plain, nonfat yogurt or flavored yogurt with no sugar added may be used as a substitute for milk. Soy milk may also be used.   Avoid: Milk shakes or sweetened milk beverages.  Soups and Combination Foods  Recommended: Clear broth or consomm are "free" foods  and may be used as an appetizer. Broth-based soups with fat removed count as a starch serving and are preferred over cream soups. Soups made with beans or split peas may be eaten but count as a starch.   Avoid: Fatty soups, soup made with cream, cheese soup. Combination foods prepared with excessive amounts of fat or with cream or cheese sauces.  Desserts and Sweets  Recommended: Ask for fresh fruit. Sponge or angel food cake without icing, ice milk, no sugar added ice cream, sherbet, or frozen yogurt may fit into your meal plan occasionally.   Avoid: Pastries, puddings, pies, cakes with icing, custard, gelatin desserts.  Fats and Oils  Recommended: Choose healthy fats such as olive oil, canola oil, or tub margarine, reduced fat or fat-free sour cream,  cream cheese, avocado, or nuts.   Avoid: Any fats in excess of your allowed portion. Deep-fried foods or any food with a large amount of fat.  Note: Ask for all fats to be served on the side, and limit your portion sizes according to your meal plan. Document Released: 07/15/2005 Document Revised: 07/04/2011 Document Reviewed: 02/02/2009 [facility] Patient Information 2012 [place].Diabetes, Frequently Asked Questions WHAT IS DIABETES? Most of the food we eat is turned into glucose (sugar). Our bodies use it for energy. The pancreas makes a hormone called insulin. It helps glucose get into the cells of our bodies. When you have diabetes, your body either does not make enough insulin or cannot use its own insulin as well as it should. This causes sugars to build up in your blood. WHAT ARE THE SYMPTOMS OF DIABETES?  Frequent urination.   Excessive thirst.   Unexplained weight loss.   Extreme hunger.   Blurred vision.   Tingling or numbness in hands or feet.   Feeling very tired much of the time.   Dry, itchy skin.   Sores that are slow to heal.   Yeast infections.  WHAT ARE THE TYPES OF DIABETES? Type 1 Diabetes   About 10% of affected people have this type.   Usually occurs before the age of 73.   Usually occurs in thin to normal weight people.  Type 2 Diabetes  About 90% of affected people have this type.   Usually occurs after the age of 15.   Usually occurs in overweight people.   More likely to have:   A family history of diabetes.   A history of diabetes during pregnancy (gestational diabetes).   High blood pressure.   High cholesterol and triglycerides.  Gestational Diabetes  Occurs in about 4% of pregnancies.   Usually goes away after the baby is born.   More likely to occur in women with:   Family history of diabetes.   Previous gestational diabetes.   Obese.   Over 43 years old.  WHAT IS PRE-DIABETES? Pre-diabetes means your blood  glucose is higher than normal, but lower than the diabetes range. It also means you are at risk of getting type 2 diabetes and heart disease. If you are told you have pre-diabetes, have your blood glucose checked again in 1 to 2 years. WHAT IS THE TREATMENT FOR DIABETES? Treatment is aimed at keeping blood glucose near normal levels at all times. Learning how to manage this yourself is important in treating diabetes. Depending on the type of diabetes you have, your treatment will include one or more of the following:  Monitoring your blood glucose.   Meal planning.   Exercise.   Oral medicine (  pills) or insulin.  CAN DIABETES BE PREVENTED? With type 1 diabetes, prevention is more difficult, because the triggers that cause it are not yet known. With type 2 diabetes, prevention is more likely, with lifestyle changes:  Maintain a healthy weight.   Eat healthy.   Exercise.  IS THERE A CURE FOR DIABETES? No, there is no cure for diabetes. There is a lot of research going on that is looking for a cure, and progress is being made. Diabetes can be treated and controlled. People with diabetes can manage their diabetes and lead normal, active lives. SHOULD I BE TESTED FOR DIABETES? If you are at least 50 years old, you should be tested for diabetes. You should be tested again every 3 years. If you are 33 or older and overweight, you may want to get tested more often. If you are younger than 63, overweight, and have one or more of the following risk factors, you should be tested:  Family history of diabetes.   Inactive lifestyle.   High blood pressure.  WHAT ARE SOME OTHER SOURCES FOR INFORMATION ON DIABETES? The following organizations may help in your search for more information on diabetes: National Diabetes Education Program (NDEP) Internet: BloggerBowl.es American Diabetes Association Internet: http://www.diabetes.org  Juvenile Diabetes Foundation  International Internet: AffordableSalon.es Document Released: 07/18/2003 Document Revised: 07/04/2011 Document Reviewed: 05/12/2009 [facility] Patient Information 2012 [place].Diabetes, Type 2 Diabetes is a long-lasting (chronic) disease. In type 2 diabetes, the pancreas does not make enough insulin (a hormone), and the body does not respond normally to the insulin that is made. This type of diabetes was also previously called adult-onset diabetes. It usually occurs after the age of 59, but it can occur at any age.  CAUSES  Type 2 diabetes happens because the pancreasis not making enough insulin or your body has trouble using the insulin that your pancreas does make properly. SYMPTOMS   Drinking more than usual.   Urinating more than usual.   Blurred vision.   Dry, itchy skin.   Frequent infections.   Feeling more tired than usual (fatigue).  DIAGNOSIS The diagnosis of type 2 diabetes is usually made by one of the following tests:  Fasting blood glucose test. You will not eat for at least 8 hours and then take a blood test.   Random blood glucose test. Your blood glucose (sugar) is checked at any time of the day regardless of when you ate.   Oral glucose tolerance test (OGTT). Your blood glucose is measured after you have not eaten (fasted) and then after you drink a glucose containing beverage.  TREATMENT   Healthy eating.   Exercise.   Medicine, if needed.   Monitoring blood glucose.   Seeing your caregiver regularly.  HOME CARE INSTRUCTIONS   Check your blood glucose at least once a day. More frequent monitoring may be necessary, depending on your medicines and on how well your diabetes is controlled. Your caregiver will advise you.   Take your medicine as directed by your caregiver.   Do not smoke.   Make wise food choices. Ask your caregiver for information. Weight loss can improve your diabetes.   Learn about low blood glucose (hypoglycemia) and how to  treat it.   Get your eyes checked regularly.   Have a yearly physical exam. Have your blood pressure checked and your blood and urine tested.   Wear a pendant or bracelet saying that you have diabetes.   Check your feet every night for cuts,  sores, blisters, and redness. Let your caregiver know if you have any problems.  SEEK MEDICAL CARE IF:   You have problems keeping your blood glucose in target range.   You have problems with your medicines.   You have symptoms of an illness that do not improve after 24 hours.   You have a sore or wound that is not healing.   You notice a change in vision or a new problem with your vision.   You have a fever.  MAKE SURE YOU:  Understand these instructions.   Will watch your condition.   Will get help right away if you are not doing well or get worse.  Document Released: 07/15/2005 Document Revised: 07/04/2011 Document Reviewed: 12/31/2010 [facility] Patient Information 2012 [place].Diet for GERD or PUD Nutrition therapy can help ease the discomfort of gastroesophageal reflux disease (GERD) and peptic ulcer disease (PUD).  HOME CARE INSTRUCTIONS   Eat your meals slowly, in a relaxed setting.   Eat 5 to 6 small meals per day.   If a food causes distress, stop eating it for a period of time.  FOODS TO AVOID  Coffee, regular or decaffeinated.   Cola beverages, regular or low calorie.   Tea, regular or decaffeinated.   Pepper.   Cocoa.   High fat foods, including meats.   Butter, margarine, hydrogenated oil (trans fats).   Peppermint or spearmint (if you have GERD).   Fruits and vegetables if not tolerated.   Alcohol.   Nicotine (smoking or chewing). This is one of the most potent stimulants to acid production in the gastrointestinal tract.   Any food that seems to aggravate your condition.  If you have questions regarding your diet, ask your caregiver or a registered dietitian. TIPS  Lying flat may make  symptoms worse. Keep the head of your bed raised 6 to 9 inches (15 to 23 cm) by using a foam wedge or blocks under the legs of the bed.   Do not lay down until 3 hours after eating a meal.   Daily physical activity may help reduce symptoms.  MAKE SURE YOU:   Understand these instructions.   Will watch your condition.   Will get help right away if you are not doing well or get worse.  Document Released: 07/15/2005 Document Revised: 07/04/2011 Document Reviewed: 05/31/2011 [facility] Patient Information 2012 [place].Esophagitis Esophagitis is inflammation of the esophagus. It can involve swelling, soreness, and pain in the esophagus. This condition can make it difficult and painful to swallow. CAUSES  Most causes of esophagitis are not serious. Many different factors can cause esophagitis, including:  Gastroesophageal reflux disease (GERD). This is when acid from your stomach flows up into the esophagus.   Recurrent vomiting.   An allergic-type reaction.   Certain medicines, especially those that come in large pills.   Ingestion of harmful chemicals, such as household cleaning products.   Heavy alcohol use.   An infection of the esophagus.   Radiation treatment for cancer.   Certain diseases such as sarcoidosis, Crohn's disease, and scleroderma. These diseases may cause recurrent esophagitis.  SYMPTOMS   Trouble swallowing.   Painful swallowing.   Chest pain.   Difficulty breathing.   Nausea.   Vomiting.   Abdominal pain.  DIAGNOSIS  Your caregiver will take your history and do a physical exam. Depending upon what your caregiver finds, certain tests may also be done, including:  Barium X-ray. You will drink a solution that coats the esophagus,  and X-rays will be taken.   Endoscopy. A lighted tube is put down the esophagus so your caregiver can examine the area.   Allergy tests. These can sometimes be arranged through follow-up visits.  TREATMENT   Treatment will depend on the cause of your esophagitis. In some cases, steroids or other medicines may be given to help relieve your symptoms or to treat the underlying cause of your condition. Medicines that may be recommended include:  Viscous lidocaine, to soothe the esophagus.   Antacids.   Acid reducers.   Proton pump inhibitors.   Antiviral medicines for certain viral infections of the esophagus.   Antifungal medicines for certain fungal infections of the esophagus.   Antibiotic medicines, depending on the cause of the esophagitis.  HOME CARE INSTRUCTIONS   Avoid foods and drinks that seem to make your symptoms worse.   Eat small, frequent meals instead of large meals.   Avoid eating for the 3 hours prior to your bedtime.   If you have trouble taking pills, use a pill splitter to decrease the size and likelihood of the pill getting stuck or injuring the esophagus on the way down. Drinking water after taking a pill also helps.   Stop smoking if you smoke.   Maintain a healthy weight.   Wear loose-fitting clothing. Do not wear anything tight around your waist that causes pressure on your stomach.   Raise the head of your bed 6 to 8 inches with wood blocks to help you sleep. Extra pillows will not help.   Only take over-the-counter or prescription medicines as directed by your caregiver.  SEEK IMMEDIATE MEDICAL CARE IF:  You have severe chest pain that radiates into your arm, neck, or jaw.   You feel sweaty, dizzy, or lightheaded.   You have shortness of breath.   You vomit blood.   You have difficulty or pain with swallowing.   You have bloody or black, tarry stools.   You have a fever.   You have a burning sensation in the chest more than 3 times a week for more than 2 weeks.   You cannot swallow, drink, or eat.   You drool because you cannot swallow your saliva.  MAKE SURE YOU:  Understand these instructions.   Will watch your condition.   Will get  help right away if you are not doing well or get worse.  Document Released: 08/22/2004 Document Revised: 07/04/2011 Document Reviewed: 03/15/2011 [facility] Patient Information 2012 [place].Gastroesophageal Reflux Disease, Adult Gastroesophageal reflux disease (GERD) happens when acid from your stomach flows up into the esophagus. When acid comes in contact with the esophagus, the acid causes soreness (inflammation) in the esophagus. Over time, GERD may create small holes (ulcers) in the lining of the esophagus. CAUSES   Increased body weight. This puts pressure on the stomach, making acid rise from the stomach into the esophagus.   Smoking. This increases acid production in the stomach.   Drinking alcohol. This causes decreased pressure in the lower esophageal sphincter (valve or ring of muscle between the esophagus and stomach), allowing acid from the stomach into the esophagus.   Late evening meals and a full stomach. This increases pressure and acid production in the stomach.   A malformed lower esophageal sphincter.  Sometimes, no cause is found. SYMPTOMS   Burning pain in the lower part of the mid-chest behind the breastbone and in the mid-stomach area. This may occur twice a week or more often.   Trouble  swallowing.   Sore throat.   Dry cough.   Asthma-like symptoms including chest tightness, shortness of breath, or wheezing.  DIAGNOSIS  Your caregiver may be able to diagnose GERD based on your symptoms. In some cases, X-rays and other tests may be done to check for complications or to check the condition of your stomach and esophagus. TREATMENT  Your caregiver may recommend over-the-counter or prescription medicines to help decrease acid production. Ask your caregiver before starting or adding any new medicines.  HOME CARE INSTRUCTIONS   Change the factors that you can control. Ask your caregiver for guidance concerning weight loss, quitting smoking, and alcohol  consumption.   Avoid foods and drinks that make your symptoms worse, such as:   Caffeine or alcoholic drinks.   Chocolate.   Peppermint or mint flavorings.   Garlic and onions.   Spicy foods.   Citrus fruits, such as oranges, lemons, or limes.   Tomato-based foods such as sauce, chili, salsa, and pizza.   Fried and fatty foods.   Avoid lying down for the 3 hours prior to your bedtime or prior to taking a nap.   Eat small, frequent meals instead of large meals.   Wear loose-fitting clothing. Do not wear anything tight around your waist that causes pressure on your stomach.   Raise the head of your bed 6 to 8 inches with wood blocks to help you sleep. Extra pillows will not help.   Only take over-the-counter or prescription medicines for pain, discomfort, or fever as directed by your caregiver.   Do not take aspirin, ibuprofen, or other nonsteroidal anti-inflammatory drugs (NSAIDs).  SEEK IMMEDIATE MEDICAL CARE IF:   You have pain in your arms, neck, jaw, teeth, or back.   Your pain increases or changes in intensity or duration.   You develop nausea, vomiting, or sweating (diaphoresis).   You develop shortness of breath, or you faint.   Your vomit is green, yellow, black, or looks like coffee grounds or blood.   Your stool is red, bloody, or black.  These symptoms could be signs of other problems, such as heart disease, gastric bleeding, or esophageal bleeding. MAKE SURE YOU:   Understand these instructions.   Will watch your condition.   Will get help right away if you are not doing well or get worse.  Document Released: 04/24/2005 Document Revised: 07/04/2011 Document Reviewed: 02/01/2011 [facility] Patient Information 2012 [place]. RESOURCE GUIDE  Dental Problems  Patients with Medicaid: [place] Phone:  [phone]                                                   Phone:  [phone]  If unable to pay or uninsured, contact:  [facility] or [facility]. to become qualified for the adult dental clinic.  Chronic Pain Problems Contact [facility]  [phone] Patients need to be referred by their primary  care doctor.  Insufficient Money for Medicine Contact [employer]:  call "211" or [place] [phone].  No Primary Care Doctor Call [facility]  [phone] Other agencies that provide inexpensive medical care    [place]  [phone]    [facility]  [place]  [phone]    [facility]  [phone]    [facility]  [place]  [phone] [place]   [phone] (emergency services [phone])  [facility]  [phone] [facility] [phone] [facility] [place] [phone] Chinita Pester [phone] Residential & Outpatient Substance Abuse Program  [phone]  Abuse/Neglect [place] [phone] [place] [phone] (After Hours)  Emergency [place] [phone]  [place] [facility] [place] [phone] [place] [phone]  MRSA Hotline #:   856-115-2286    [facility] Dept. [address]      [place] Phone:  [phone]                                   Phone:  [phone]                 Phone:   Short Phone:  [place] [phone] (After Hours)

## 2011-10-02 NOTE — ED Notes (Signed)
Pharmacy called and sts pt cannot afford zofran and wants approval to switch med to phenergan. Marcene Brawn, PA approved prescription via telephone for phenergan.

## 2014-05-16 IMAGING — US US ABDOMEN LIMITED
1 series · 14 of 25 positions shown · non-contrast
Comparison: None.

CLINICAL DATA: Mid thoracic back pain for 3 weeks, increased
bilirubin

EXAM:
US ABDOMEN LIMITED - RIGHT UPPER QUADRANT

[Series 1: us abdomen limited · 0.15mm/px · 14 of 44 slices shown]
[im 1/44]
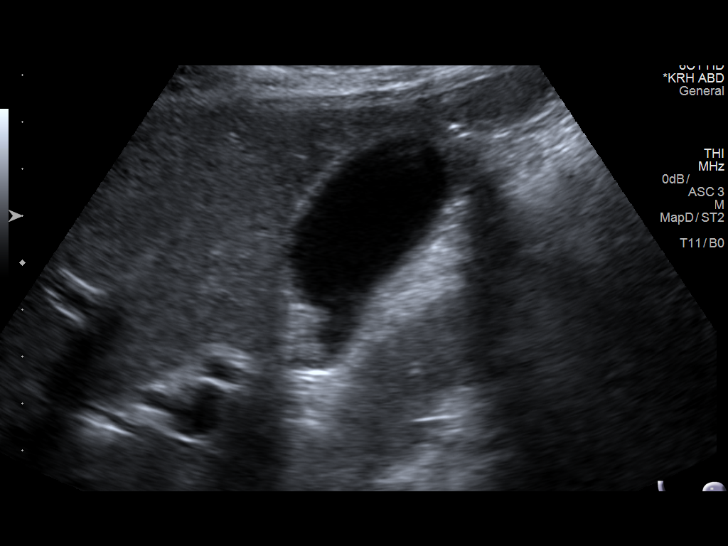
[im 4/44]
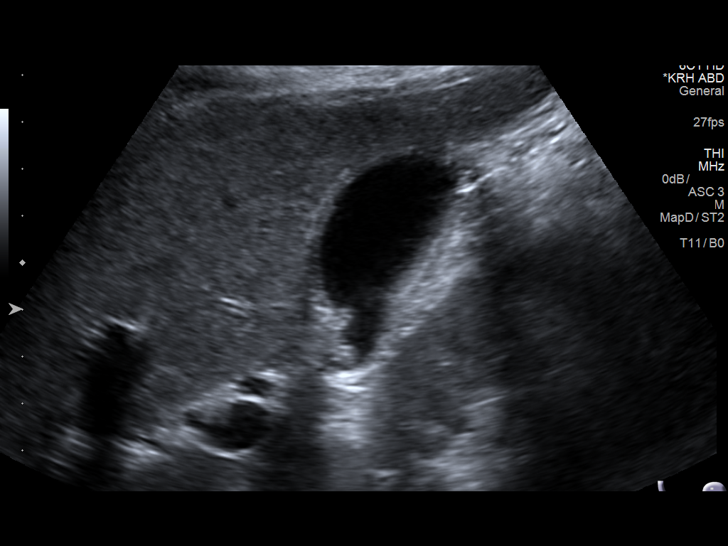
[im 8/44]
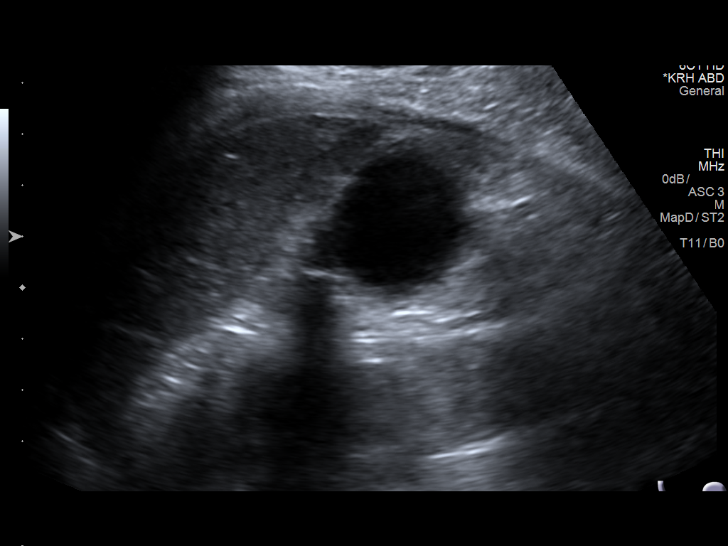
[im 11/44]
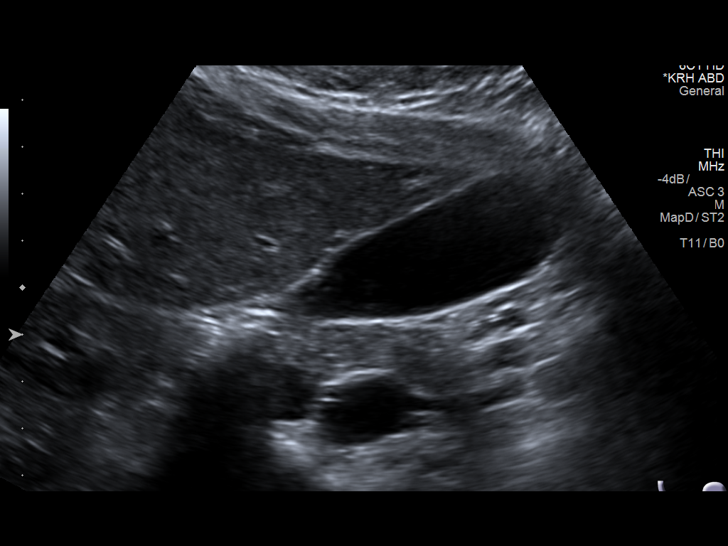
[im 15/44]
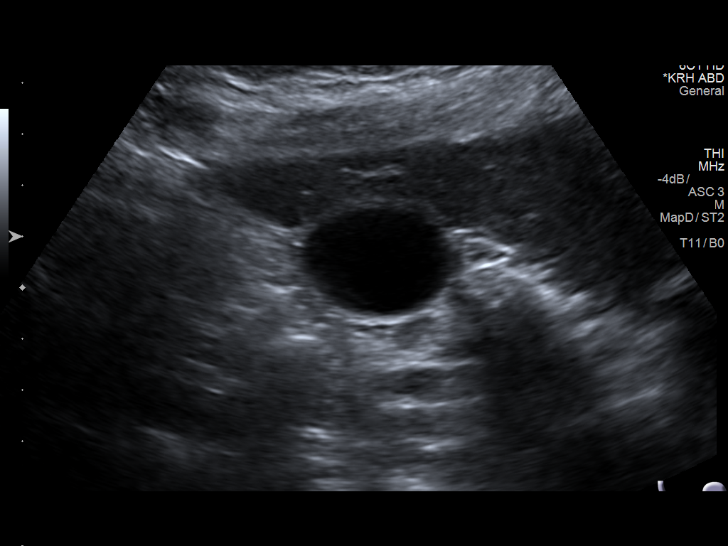
[im 17/44]
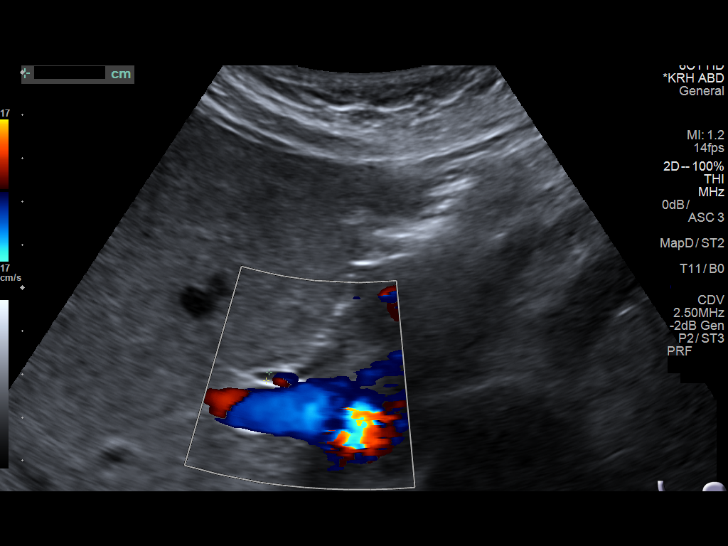
[im 20/44]
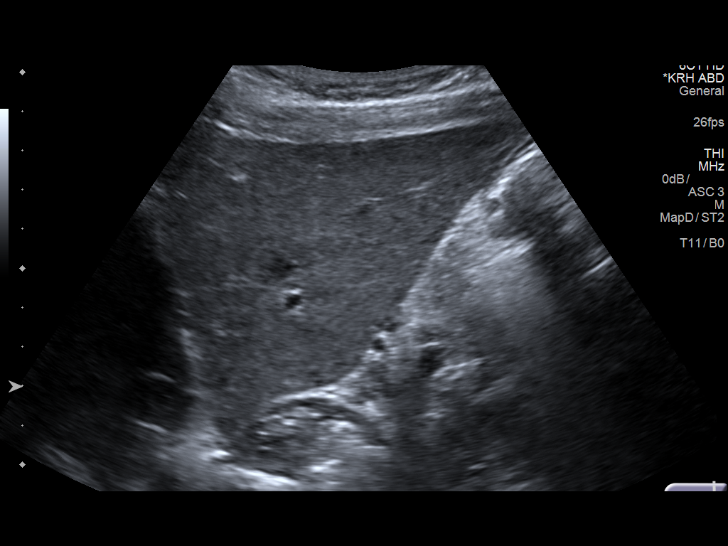
[im 24/44]
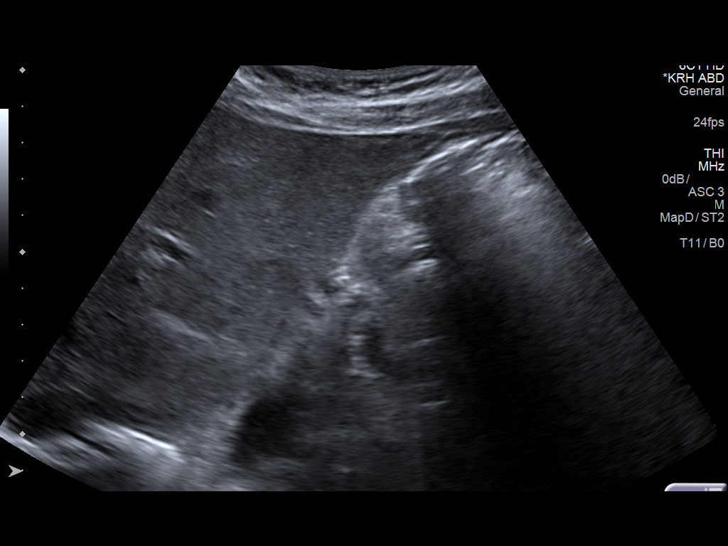
[im 27/44]
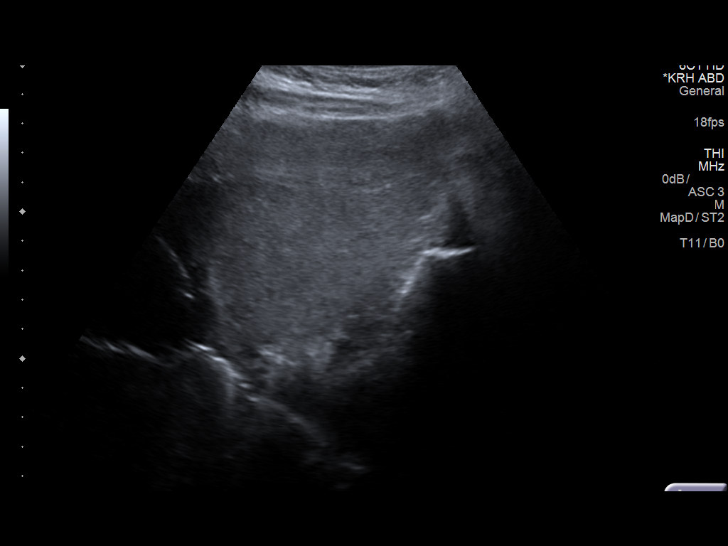
[im 29/44]
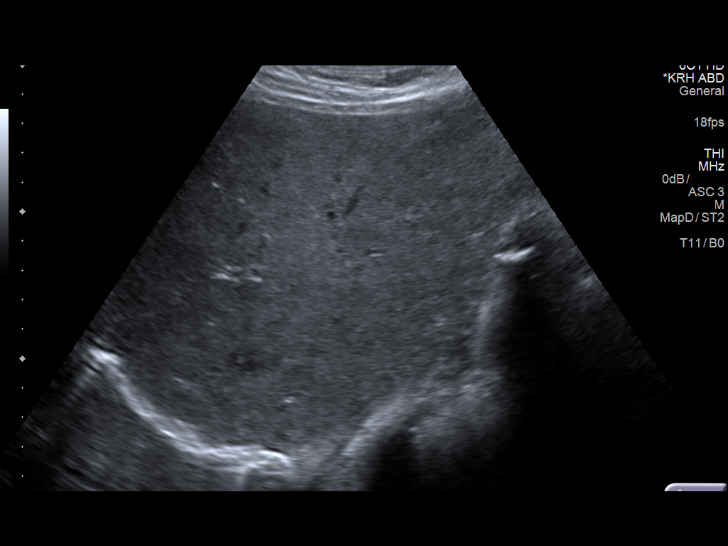
[im 33/44]
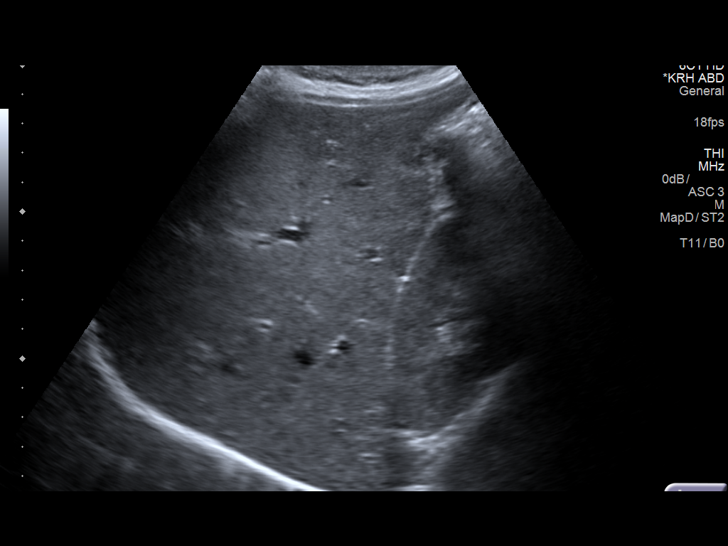
[im 36/44]
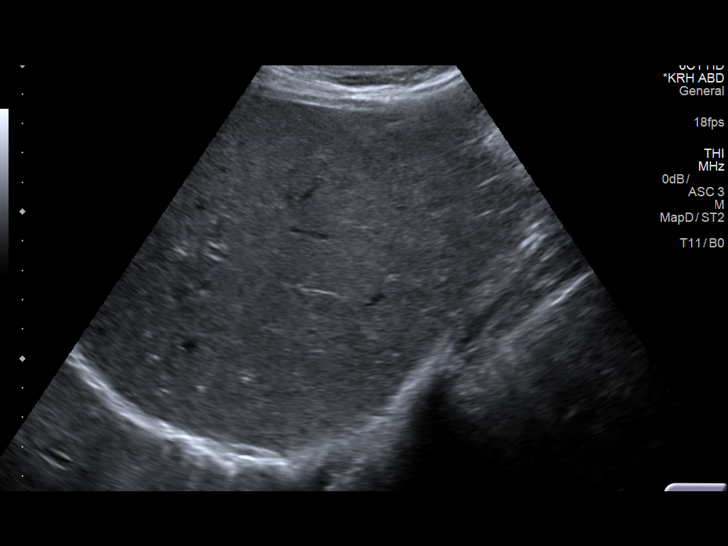
[im 40/44]
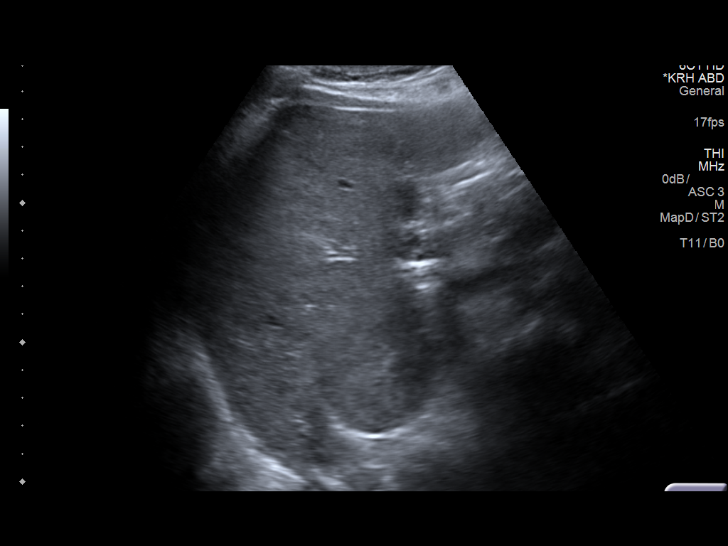
[im 44/44]
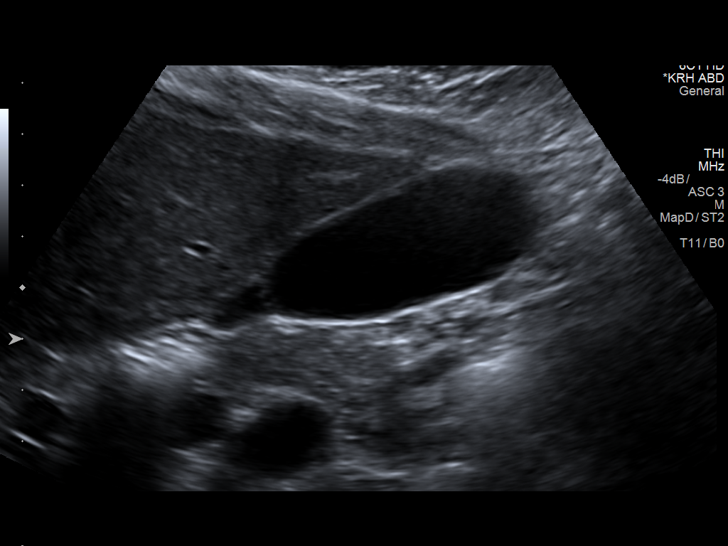

[14 of 25 positions shown; findings below may reference images not displayed]

FINDINGS: Gallbladder:

No shadowing gallstones are noted within gallbladder. Gallbladder
sludge is noted. No thickening of gallbladder wall. No sonographic
Murphy's sign.

Common bile duct:

Diameter: 2.1 mm in diameter within normal limits.

Liver:

No focal lesion identified. Within normal limits in parenchymal
echogenicity.
IMPRESSION: 1. No shadowing gallstones are noted within gallbladder. Gallbladder
sludge is noted. No sonographic Murphy's sign. Normal CBD.

## 2016-01-16 ENCOUNTER — Emergency Department (HOSPITAL_BASED_OUTPATIENT_CLINIC_OR_DEPARTMENT_OTHER): Payer: Self-pay

## 2016-01-16 ENCOUNTER — Emergency Department (HOSPITAL_BASED_OUTPATIENT_CLINIC_OR_DEPARTMENT_OTHER)
Admission: EM | Admit: 2016-01-16 | Discharge: 2016-01-16 | Disposition: A | Discharge status: Home / Self Care | Payer: Self-pay | Attending: Emergency Medicine | Admitting: Emergency Medicine

## 2016-01-16 ENCOUNTER — Encounter (HOSPITAL_BASED_OUTPATIENT_CLINIC_OR_DEPARTMENT_OTHER): Payer: Self-pay | Admitting: *Deleted

## 2016-01-16 DIAGNOSIS — R634 Abnormal weight loss: Secondary | ICD-10-CM

## 2016-01-16 DIAGNOSIS — Z7982 Long term (current) use of aspirin: Secondary | ICD-10-CM | POA: Insufficient documentation

## 2016-01-16 DIAGNOSIS — M79669 Pain in unspecified lower leg: Secondary | ICD-10-CM | POA: Insufficient documentation

## 2016-01-16 DIAGNOSIS — E119 Type 2 diabetes mellitus without complications: Secondary | ICD-10-CM

## 2016-01-16 DIAGNOSIS — E7800 Pure hypercholesterolemia, unspecified: Secondary | ICD-10-CM | POA: Insufficient documentation

## 2016-01-16 DIAGNOSIS — M79606 Pain in leg, unspecified: Secondary | ICD-10-CM

## 2016-01-16 DIAGNOSIS — Z7984 Long term (current) use of oral hypoglycemic drugs: Secondary | ICD-10-CM | POA: Insufficient documentation

## 2016-01-16 DIAGNOSIS — R630 Anorexia: Secondary | ICD-10-CM

## 2016-01-16 DIAGNOSIS — M546 Pain in thoracic spine: Secondary | ICD-10-CM

## 2016-01-16 DIAGNOSIS — R17 Unspecified jaundice: Secondary | ICD-10-CM

## 2016-01-16 DIAGNOSIS — F17210 Nicotine dependence, cigarettes, uncomplicated: Secondary | ICD-10-CM | POA: Insufficient documentation

## 2016-01-16 LAB — URINALYSIS, ROUTINE W REFLEX MICROSCOPIC
Bilirubin Urine: NEGATIVE
Glucose, UA: >1000 mg/dL — AB
Ketones, ur: NEGATIVE mg/dL
LEUKOCYTES UA: NEGATIVE
NITRITE: POSITIVE — AB
PROTEIN: NEGATIVE mg/dL
pH: 5.5 (ref 5.0–8.0)

## 2016-01-16 LAB — URINE MICROSCOPIC-ADD ON

## 2016-01-16 LAB — LIPASE, BLOOD: Lipase: 14 U/L (ref 11–51)

## 2016-01-16 LAB — CBG MONITORING, ED: GLUCOSE-CAPILLARY: 280 mg/dL — AB (ref 65–99)

## 2016-01-16 IMAGING — CR DG CHEST 2V
2 series · 2 of 2 positions shown · non-contrast
Comparison: Chest x-ray [DATE]

CLINICAL DATA: Chest pain for 3 weeks with right upper extremity
numbness. No recent injury.

EXAM:
CHEST  2 VIEW

[w chest pa]
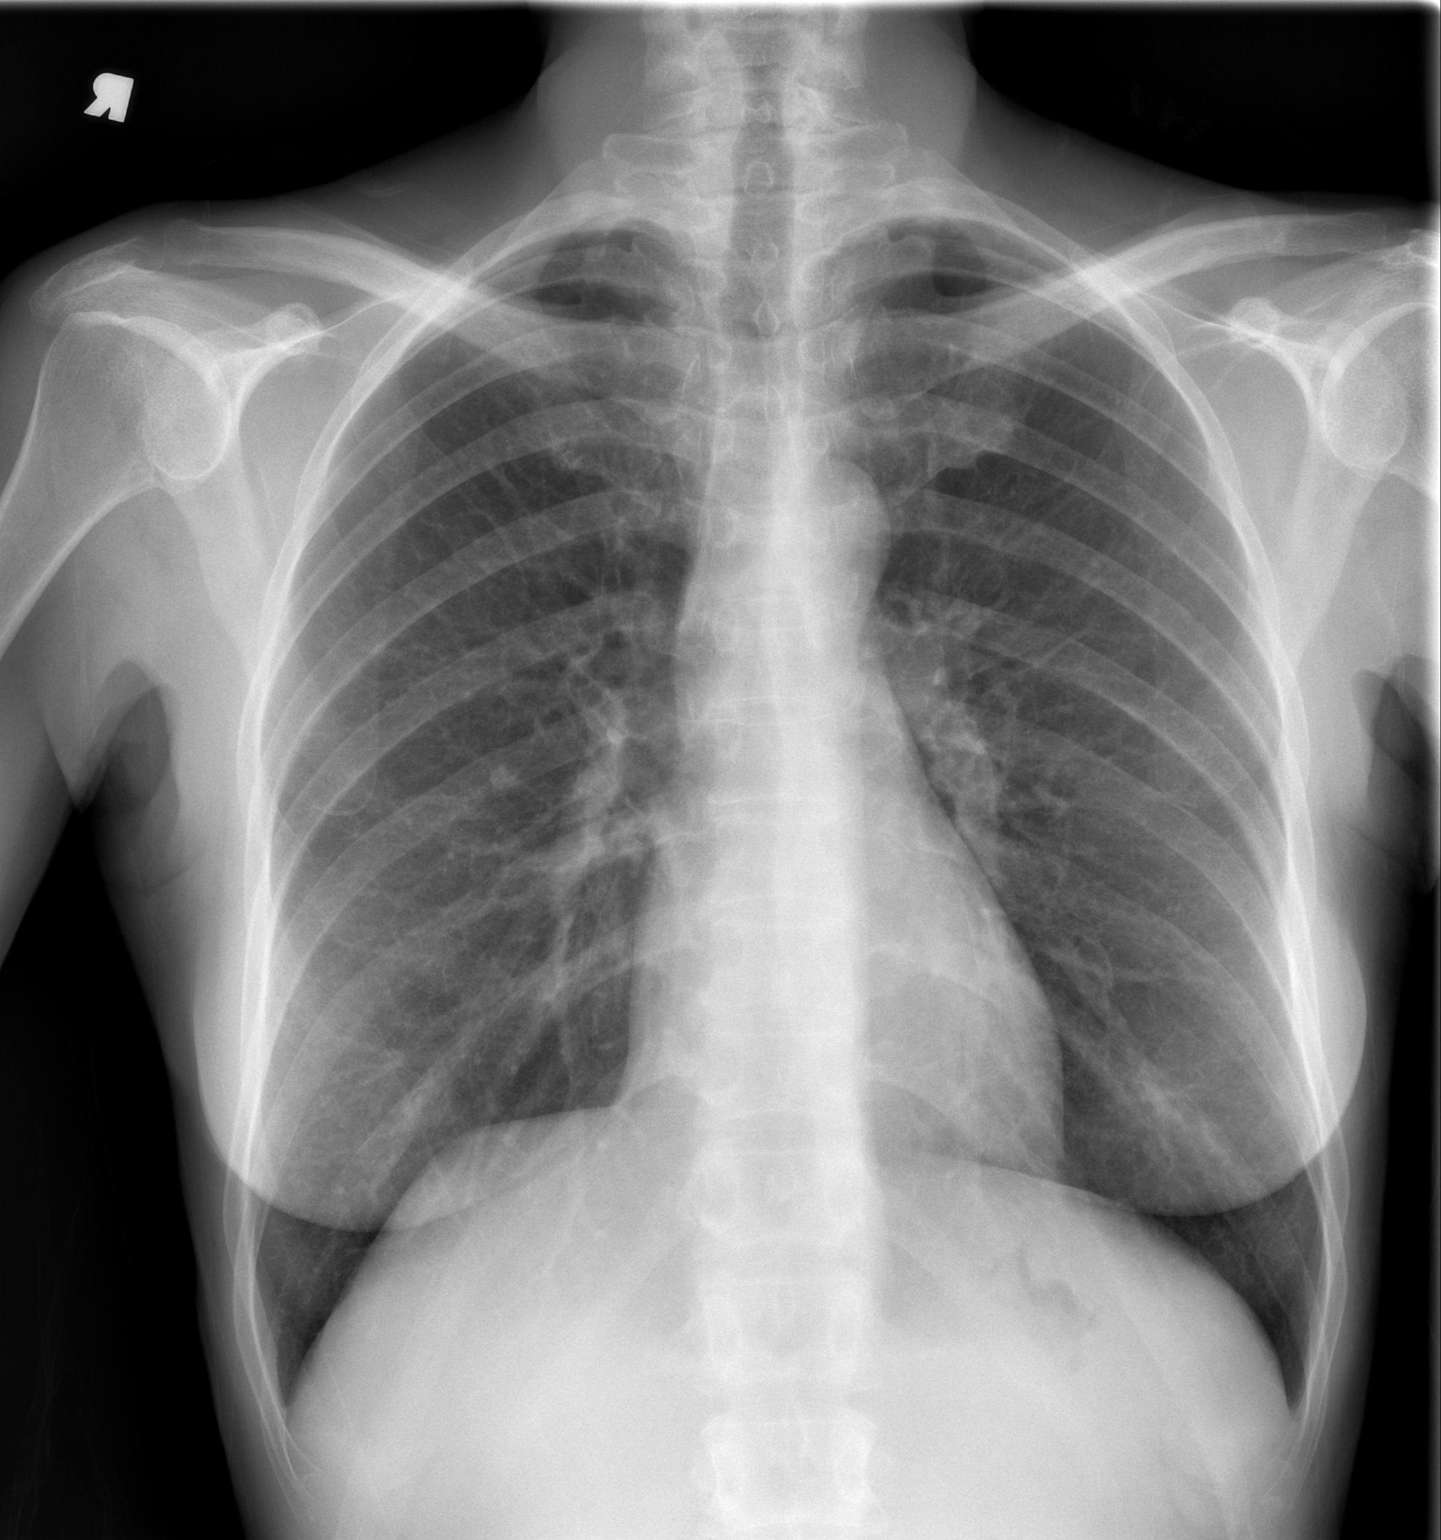

[w chest lat]
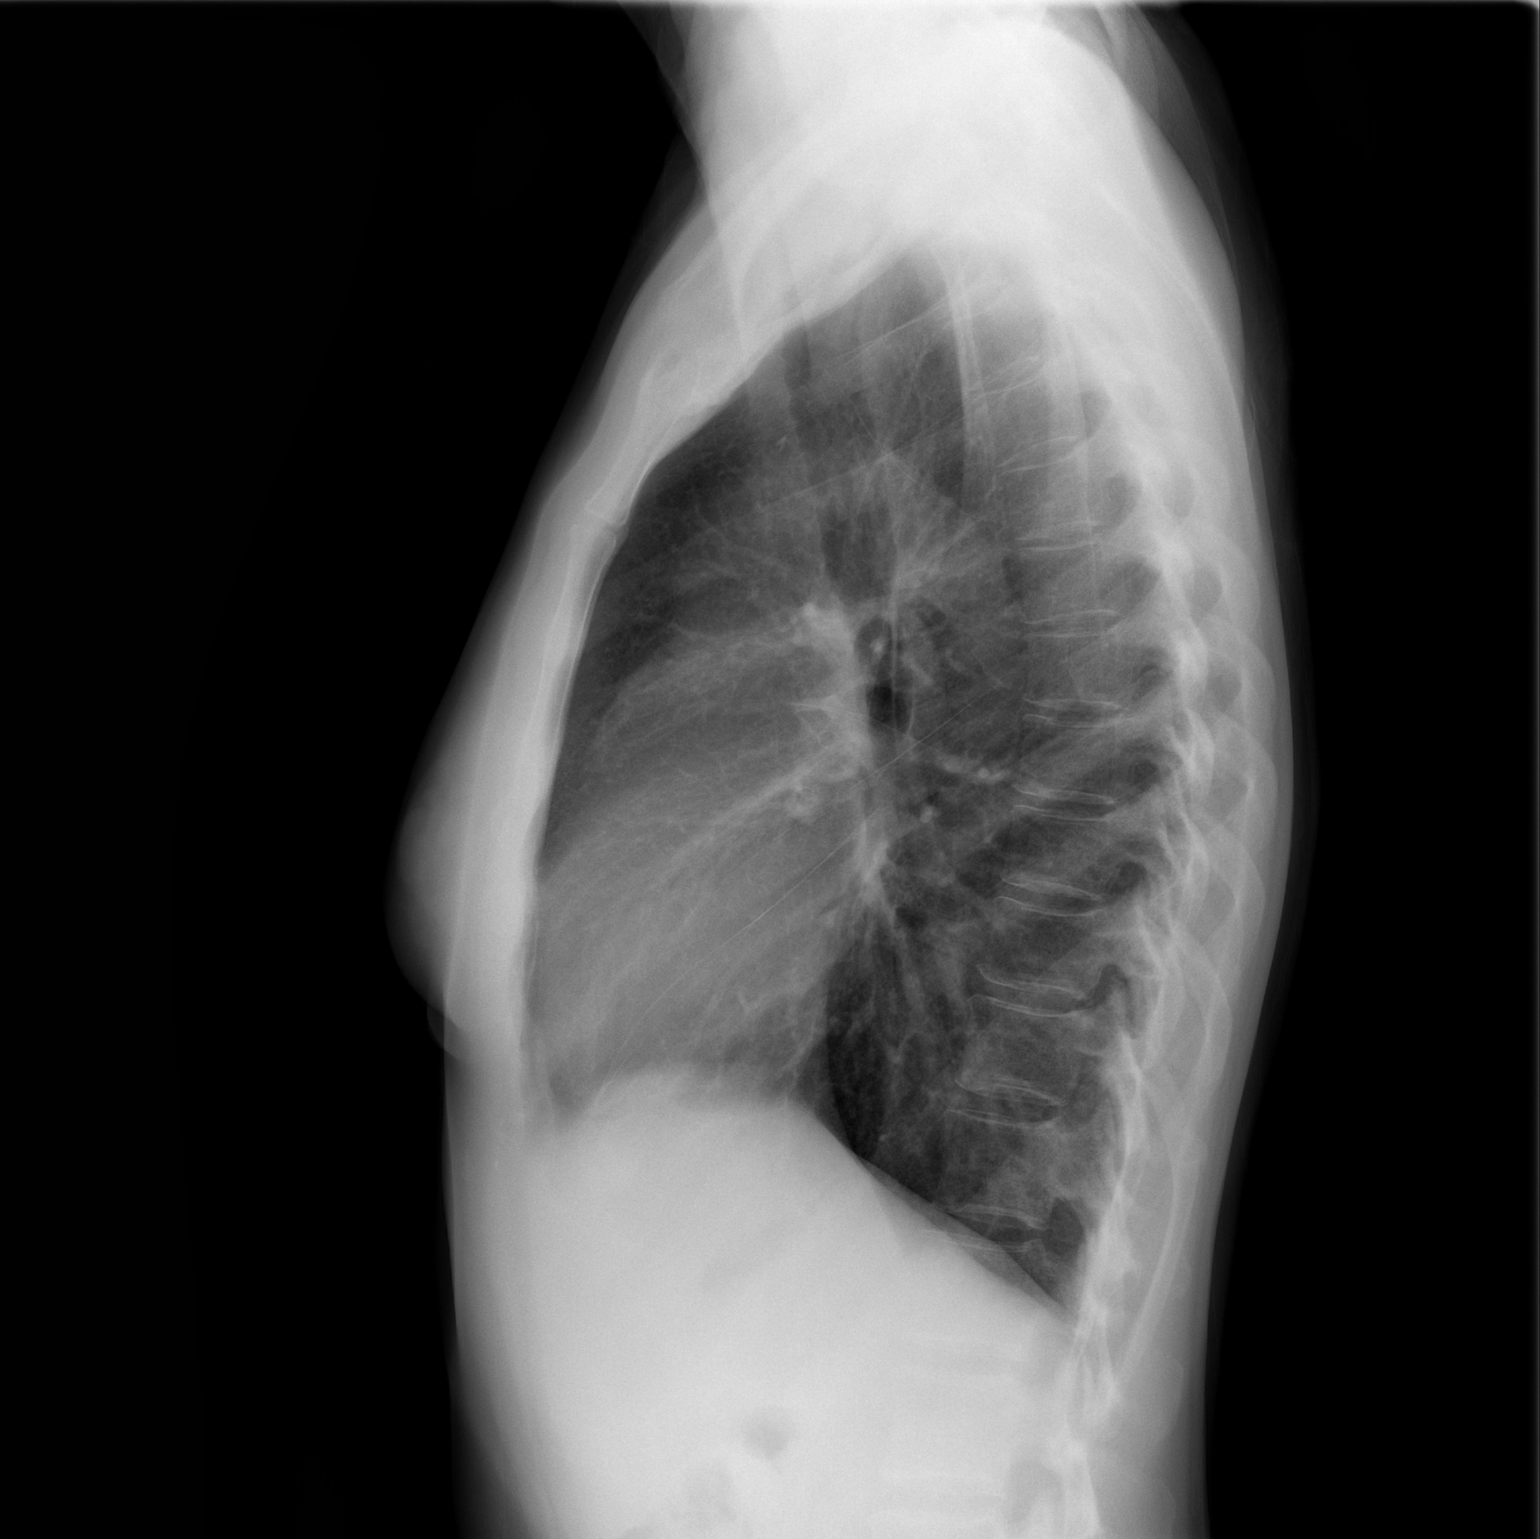

[2 of 2 positions shown; findings below may reference images not displayed]

FINDINGS: The cardiac silhouette, mediastinal and hilar contours are within
normal limits and stable. Mild emphysematous changes but no acute
pulmonary findings. No pleural effusion. The bony thorax is intact.
IMPRESSION: No acute cardiopulmonary findings. Suspect mild emphysematous
changes.

## 2016-01-16 MED ORDER — METOCLOPRAMIDE HCL 10 MG PO TABS: 10.0000 mg | ORAL_TABLET | Freq: Three times a day (TID) | ORAL | Status: DC | PRN

## 2016-01-16 MED ORDER — DIPHENHYDRAMINE HCL 25 MG PO CAPS
25.0000 mg | ORAL_CAPSULE | Freq: Once | ORAL | Status: AC
  Administered 2016-01-16: 25 mg via ORAL
  Filled 2016-01-16: qty 1

## 2016-01-16 MED ORDER — OMEPRAZOLE 20 MG PO CPDR: 20.0000 mg | DELAYED_RELEASE_CAPSULE | Freq: Every day | ORAL | Status: DC

## 2016-01-16 MED ORDER — METFORMIN HCL 500 MG PO TABS: 500.0000 mg | ORAL_TABLET | Freq: Two times a day (BID) | ORAL | Status: DC

## 2016-01-16 MED ORDER — DIPHENHYDRAMINE HCL 25 MG PO TABS: 25.0000 mg | ORAL_TABLET | Freq: Three times a day (TID) | ORAL | Status: DC | PRN

## 2016-01-16 MED ORDER — METOCLOPRAMIDE HCL 10 MG PO TABS
10.0000 mg | ORAL_TABLET | Freq: Once | ORAL | Status: AC
  Administered 2016-01-16: 10 mg via ORAL
  Filled 2016-01-16: qty 1

## 2016-01-16 NOTE — ED Notes (Signed)
Pt reports headache x 2 days, 10/10 pain, radiating to neck muscles

## 2016-01-16 NOTE — ED Provider Notes (Signed)
CSN: [id number]     Arrival date & time 01/16/16  1459 History   First MD Initiated Contact with Patient 01/16/16 1613     Chief Complaint  Patient presents with  . Muscle Pain     (Consider location/radiation/quality/duration/timing/severity/associated sxs/prior Treatment) HPI The patient reports she has several problems. The patient has not been treated for her diabetes for several years. He reports that she has not had insurance has not been able to seek medical care. She reports she's lost about 60 pounds in the past 8 months. She states she has loss of appetite and discomfort in her thoracic back. The pain in her thoracic back has been there for several weeks. She indicates it is fairly central and goes out to beneath her shoulder blades. She also gets pain into her right arm. No cough, no shortness of breath, no anterior chest pain. No vomiting. Patient poor she is able to eat but she has loss of appetite. No diarrhea or constipation. No pain burning or urgency with urination. No incontinence of urine. Patient poor she also gets pains in her legs. She reports they come from anywhere from her feet and lower legs in shoot up the leg. They are variable in location. It may be one leg or the other. This is not provoked by any particular activity. She reports that she had been reading about the pain and her diabetes and thought it might be neuropathy. She denies she has lower back pain. No rashes or swelling of the legs. Patient reports that she's had a headache on the top of her head and her forehead. No sore throat, no eye pain, no ear pain, no neck pain. The patient reports she does have a long history of headaches. She has had headaches for many years and been diagnosed with migraines. She has not had treatment lately. She reports usually her headaches get better with some BC powders. Past Medical History  Diagnosis Date  . Diabetes mellitus   . Hypercholesteremia    Past Surgical History   Procedure Laterality Date  . Ovary surgery    . Cataract extraction     No family history on file. Social History  Substance Use Topics  . Smoking status: Current Some Day Smoker -- 0.50 packs/day    Types: Cigarettes  . Smokeless tobacco: None  . Alcohol Use: No   OB History    No data available     Review of Systems 10 Systems reviewed and are negative for acute change except as noted in the HPI.    Allergies  Penicillins  Home Medications   Prior to Admission medications   Medication Sig Start Date End Date Taking? Authorizing Provider  Aspirin-Salicylamide-Caffeine (BC HEADACHE POWDER PO) Take 1 packet by mouth once.   Yes Historical Provider, MD  metFORMIN (GLUCOPHAGE) 500 MG tablet Take 1 tablet (500 mg total) by mouth 2 (two) times daily with a meal. 09/29/11  Yes Charlena Cross, MD  diphenhydrAMINE (BENADRYL) 25 MG tablet Take 1 tablet (25 mg total) by mouth every 8 (eight) hours as needed (Take with Reglan for headache as needed. Every 8 hours.). 01/16/16   Charlesetta Shanks, MD  famotidine (PEPCID) 20 MG tablet Take 1 tablet (20 mg total) by mouth 2 (two) times daily as needed for heartburn (upset stomach). 09/29/11 09/28/12  Charlena Cross, MD  metFORMIN (GLUCOPHAGE) 500 MG tablet Take 1 tablet (500 mg total) by mouth 2 (two) times daily with a meal. 01/16/16  Charlesetta Shanks, MD  metoCLOPramide (REGLAN) 10 MG tablet Take 1 tablet (10 mg total) by mouth every 8 (eight) hours as needed for nausea (Take for migraine headache with Benadryl.). 01/16/16   Charlesetta Shanks, MD  omeprazole (PRILOSEC) 20 MG capsule Take 1 capsule (20 mg total) by mouth daily. 01/16/16   Charlesetta Shanks, MD   BP 141/88 mmHg  Pulse 112  Temp(Src) 98.9 F (37.2 C) (Oral)  Resp 20  Ht 5' 7.5" (1.715 m)  Wt 100 lb (45.36 kg)  BMI 15.42 kg/m2  SpO2 98% Physical Exam  Constitutional: She is oriented to person, place, and time.  Patient is thin but well-developed. She does not seem to have extensive  muscular atrophy. Patient is not cachectic. She is nontoxic and alert. No acute distress.  HENT:  Head: Normocephalic and atraumatic.  Right Ear: External ear normal.  Left Ear: External ear normal.  Nose: Nose normal.  Mouth/Throat: Oropharynx is clear and moist. No oropharyngeal exudate.  Eyes: EOM are normal. Pupils are equal, round, and reactive to light. No scleral icterus.  Neck: Neck supple.  Cardiovascular: Normal rate, regular rhythm, normal heart sounds and intact distal pulses.   Pulmonary/Chest: Effort normal and breath sounds normal.  Patient has tenderness to palpation on the paraspinous muscle bodies in the thoracic spine from about T10 out towards the points of the scapula bilaterally. Patient does not have focal tenderness of the vertebral bodies. No rash or palpable abnormality. Entire spine is nontender without bony point tenderness  Abdominal: Soft. Bowel sounds are normal. She exhibits no distension. There is no tenderness.  Musculoskeletal: Normal range of motion. She exhibits no edema or tenderness.  Lower extremities are normal. She does not have peripheral edema. Condition of the joints is good. There are no effusions. Condition of the feet is very good. Patient has normal range of motion and normal strength testing of both lower extremities. Sensation is intact to light touch. She illustrates various locations of intermittent lancinating pains.  Neurological: She is alert and oriented to person, place, and time. She has normal strength. No cranial nerve deficit. She exhibits normal muscle tone. Coordination normal. GCS eye subscore is 4. GCS verbal subscore is 5. GCS motor subscore is 6.  Skin: Skin is warm, dry and intact.  Psychiatric: She has a normal mood and affect.    ED Course  Procedures (including critical care time) Labs Review Labs Reviewed  URINALYSIS, ROUTINE W REFLEX MICROSCOPIC (NOT AT [facility]) - Abnormal; Notable for the following:    APPearance CLOUDY  (*)    Specific Gravity, Urine >1.046 (*)    Glucose, UA >1000 (*)    Hgb urine dipstick SMALL (*)    Nitrite POSITIVE (*)    All other components within normal limits  URINE MICROSCOPIC-ADD ON - Abnormal; Notable for the following:    Squamous Epithelial / LPF 0-5 (*)    Bacteria, UA MANY (*)    All other components within normal limits  CBG MONITORING, ED - Abnormal; Notable for the following:    Glucose-Capillary 280 (*)    All other components within normal limits  LIPASE, BLOOD    Imaging Review Dg Chest 2 View  01/16/2016  CLINICAL DATA:  Chest pain for 3 weeks with right upper extremity numbness. No recent injury. EXAM: CHEST  2 VIEW COMPARISON:  Chest x-ray 09/29/2011 FINDINGS: The cardiac silhouette, mediastinal and hilar contours are within normal limits and stable. Mild emphysematous changes but no acute pulmonary findings. No  pleural effusion. The bony thorax is intact. IMPRESSION: No acute cardiopulmonary findings. Suspect mild emphysematous changes. Electronically Signed   By: Marijo Sanes M.D.   On: 01/16/2016 18:26   US Abdomen Limited Ruq  01/16/2016  CLINICAL DATA:  Mid thoracic back pain for 3 weeks, increased bilirubin EXAM: US ABDOMEN LIMITED - RIGHT UPPER QUADRANT COMPARISON:  None. FINDINGS: Gallbladder: No shadowing gallstones are noted within gallbladder. Gallbladder sludge is noted. No thickening of gallbladder wall. No sonographic Murphy's sign. Common bile duct: Diameter: 2.1 mm in diameter within normal limits. Liver: No focal lesion identified. Within normal limits in parenchymal echogenicity. IMPRESSION: 1. No shadowing gallstones are noted within gallbladder. Gallbladder sludge is noted. No sonographic Murphy's sign. Normal CBD. Electronically Signed   By: Lahoma Crocker M.D.   On: 01/16/2016 18:11   I have personally reviewed and evaluated these images and lab results as part of my medical decision-making.   EKG Interpretation None      MDM   Final  diagnoses:  Bilateral thoracic back pain  Pain of lower extremity, unspecified laterality  Weight loss  Loss of appetite  Type 2 diabetes mellitus without complication, without long-term current use of insulin ([place])   Patient resents for several indolent problems. Her general appearance is very good. She is nontoxic and alert and has a normal physical exam. Patient has been a poorly managed diabetic for several years. At this time the patient is counseled that she definitively needs primary care follow-up and further diagnostic study. She does have mild elevation in her LFTs but ultrasound does not show cholecystitis or stones. There is some sludge present. Patient may have subacute biliary disease. She is counseled on gallbladder diet. She is also counseled on the necessity of follow-up HIDA scan. Patient will be also started on Prilosec for possible gastritis. The patient's thoracic pain does not have obvious etiology. She describes the pain as being predominantly bilateral and just outside the midline. There is a reproducible component to this. Chest x-ray does not show any acute anomaly. Review of systems does not indicate intrathoracic or cardiac etiology. The patient's weight loss may be secondary to poorly managed diabetes. She is counseled however that we cannot do adequate diagnostic evaluation and complete testing to rule out other more ominous etiologies such as an occult cancer. She is advised for both management of her diabetes as well as the possibility of other more indolent diseases she must schedule follow-up with a primary physician. At this time no evident focal etiology of weight loss or nonspecific extremity and back pain. There is no associated neurologic function. A shoots pains are random and lancinating in her lower extremities. The condition of function of lower extremities is excellent.    Charlesetta Shanks, MD 01/16/16 [phone]

## 2016-01-16 NOTE — ED Notes (Signed)
Pain in her upper back for several weeks. Arm numbness x 2 weeks. Weight loss of 60 pounds for the past year.

## 2016-01-16 NOTE — Discharge Instructions (Signed)
Low-Fat Diet for Pancreatitis or Gallbladder Conditions You will need a follow-up test that has to be scheduled as an outpatient to determine if your gallbladder is functioning at a normal rate. This test is called a HIDA scan. Discussed this with your family physician. You will need to schedule a follow-up appointment with a family physician. You may call [facility] wellness as listed. You may also use the referral number in your discharge instructions to help you find an available provider. A low-fat diet can be helpful if you have pancreatitis or a gallbladder condition. With these conditions, your pancreas and gallbladder have trouble digesting fats. A healthy eating plan with less fat will help rest your pancreas and gallbladder and reduce your symptoms. WHAT DO I NEED TO KNOW ABOUT THIS DIET?  Eat a low-fat diet.  Reduce your fat intake to less than 20-30% of your total daily calories. This is less than 50-60 g of fat per day.  Remember that you need some fat in your diet. Ask your dietician what your daily goal should be.  Choose nonfat and low-fat healthy foods. Look for the words "nonfat," "low fat," or "fat free."  As a guide, look on the label and choose foods with less than 3 g of fat per serving. Eat only one serving.  Avoid alcohol.  Do not smoke. If you need help quitting, talk with your health care provider.  Eat small frequent meals instead of three large heavy meals. WHAT FOODS CAN I EAT? Grains Include healthy grains and starches such as potatoes, wheat bread, fiber-rich cereal, and brown rice. Choose whole grain options whenever possible. In adults, whole grains should account for 45-65% of your daily calories.  Fruits and Vegetables Eat plenty of fruits and vegetables. Fresh fruits and vegetables add fiber to your diet. Meats and Other Protein Sources Eat lean meat such as chicken and pork. Trim any fat off of meat before cooking it. Eggs, fish, and beans are other  sources of protein. In adults, these foods should account for 10-35% of your daily calories. Dairy Choose low-fat milk and dairy options. Dairy includes fat and protein, as well as calcium.  Fats and Oils Limit high-fat foods such as fried foods, sweets, baked goods, sugary drinks.  Other Creamy sauces and condiments, such as mayonnaise, can add extra fat. Think about whether or not you need to use them, or use smaller amounts or low fat options. WHAT FOODS ARE NOT RECOMMENDED?  High fat foods, such as:  [employer].  Ice cream.  [place] toast.  Sweet rolls.  Pizza.  Cheese bread.  Foods covered with batter, butter, creamy sauces, or cheese.  Fried foods.  Sugary drinks and desserts.  Foods that cause gas or bloating   This information is not intended to replace advice given to you by your health care provider. Make sure you discuss any questions you have with your health care provider.   Document Released: 07/20/2013 Document Reviewed: 07/20/2013 Elsevier Interactive Patient Education 2016 Elsevier Inc.  Back Pain, Adult Back pain is very common in adults.The cause of back pain is rarely dangerous and the pain often gets better over time.The cause of your back pain may not be known. Some common causes of back pain include:  Strain of the muscles or ligaments supporting the spine.  Wear and tear (degeneration) of the spinal disks.  Arthritis.  Direct injury to the back. For many people, back pain may return. Since back pain is rarely dangerous,  most people can learn to manage this condition on their own. HOME CARE INSTRUCTIONS Watch your back pain for any changes. The following actions may help to lessen any discomfort you are feeling:  Remain active. It is stressful on your back to sit or stand in one place for long periods of time. Do not sit, drive, or stand in one place for more than 30 minutes at a time. Take short walks on even surfaces as soon as you are  able.Try to increase the length of time you walk each day.  Exercise regularly as directed by your health care provider. Exercise helps your back heal faster. It also helps avoid future injury by keeping your muscles strong and flexible.  Do not stay in bed.Resting more than 1-2 days can delay your recovery.  Pay attention to your body when you bend and lift. The most comfortable positions are those that put less stress on your recovering back. Always use proper lifting techniques, including:  Bending your knees.  Keeping the load close to your body.  Avoiding twisting.  Find a comfortable position to sleep. Use a firm mattress and lie on your side with your knees slightly bent. If you lie on your back, put a pillow under your knees.  Avoid feeling anxious or stressed.Stress increases muscle tension and can worsen back pain.It is important to recognize when you are anxious or stressed and learn ways to manage it, such as with exercise.  Take medicines only as directed by your health care provider. Over-the-counter medicines to reduce pain and inflammation are often the most helpful.Your health care provider may prescribe muscle relaxant drugs.These medicines help dull your pain so you can more quickly return to your normal activities and healthy exercise.  Apply ice to the injured area:  Put ice in a plastic bag.  Place a towel between your skin and the bag.  Leave the ice on for 20 minutes, 2-3 times a day for the first 2-3 days. After that, ice and heat may be alternated to reduce pain and spasms.  Maintain a healthy weight. Excess weight puts extra stress on your back and makes it difficult to maintain good posture. SEEK MEDICAL CARE IF:  You have pain that is not relieved with rest or medicine.  You have increasing pain going down into the legs or buttocks.  You have pain that does not improve in one week.  You have night pain.  You lose weight.  You have a fever or  chills. SEEK IMMEDIATE MEDICAL CARE IF:   You develop new bowel or bladder control problems.  You have unusual weakness or numbness in your arms or legs.  You develop nausea or vomiting.  You develop abdominal pain.  You feel faint.   This information is not intended to replace advice given to you by your health care provider. Make sure you discuss any questions you have with your health care provider.   Suspected Peripheral Neuropathy Peripheral neuropathy is a type of nerve damage. It affects nerves that carry signals between the spinal cord and other parts of the body. These are called peripheral nerves. With peripheral neuropathy, one nerve or a group of nerves may be damaged.  CAUSES  Many things can damage peripheral nerves. For some people with peripheral neuropathy, the cause is unknown. Some causes include:  Diabetes. This is the most common cause of peripheral neuropathy.  Injury to a nerve.  Pressure or stress on a nerve that lasts a long time.  Too little vitamin B. Alcoholism can lead to this.  Infections.  Autoimmune diseases, such as multiple sclerosis and systemic lupus erythematosus.  Inherited nerve diseases.  Some medicines, such as cancer drugs.  Toxic substances, such as lead and mercury.  Too little blood flowing to the legs.  Kidney disease.  Thyroid disease. SIGNS AND SYMPTOMS  Different people have different symptoms. The symptoms you have will depend on which of your nerves is damaged. Common symptoms include:  Loss of feeling (numbness) in the feet and hands.  Tingling in the feet and hands.  Pain that burns.  Very sensitive skin.  Weakness.  Not being able to move a part of the body (paralysis).  Muscle twitching.  Clumsiness or poor coordination.  Loss of balance.  Not being able to control your bladder.  Feeling dizzy.  Sexual problems. DIAGNOSIS  Peripheral neuropathy is a symptom, not a disease. Finding the cause  of peripheral neuropathy can be hard. To figure that out, your health care provider will take a medical history and do a physical exam. A neurological exam will also be done. This involves checking things affected by your brain, spinal cord, and nerves (nervous system). For example, your health care provider will check your reflexes, how you move, and what you can feel.  Other types of tests may also be ordered, such as:  Blood tests.  A test of the fluid in your spinal cord.  Imaging tests, such as CT scans or an MRI.  Electromyography (EMG). This test checks the nerves that control muscles.  Nerve conduction velocity tests. These tests check how fast messages pass through your nerves.  Nerve biopsy. A small piece of nerve is removed. It is then checked under a microscope. TREATMENT   Medicine is often used to treat peripheral neuropathy. Medicines may include:  Pain-relieving medicines. Prescription or over-the-counter medicine may be suggested.  Antiseizure medicine. This may be used for pain.  Antidepressants. These also may help ease pain from neuropathy.  Lidocaine. This is a numbing medicine. You might wear a patch or be given a shot.  Mexiletine. This medicine is typically used to help control irregular heart rhythms.  Surgery. Surgery may be needed to relieve pressure on a nerve or to destroy a nerve that is causing pain.  Physical therapy to help movement.  Assistive devices to help movement. HOME CARE INSTRUCTIONS   Only take over-the-counter or prescription medicines as directed by your health care provider. Follow the instructions carefully for any given medicines. Do not take any other medicines without first getting approval from your health care provider.  If you have diabetes, work closely with your health care provider to keep your blood sugar under control.  If you have numbness in your feet:  Check every day for signs of injury or infection. Watch for  redness, warmth, and swelling.  Wear padded socks and comfortable shoes. These help protect your feet.  Do not do things that put pressure on your damaged nerve.  Do not smoke. Smoking keeps blood from getting to damaged nerves.  Avoid or limit alcohol. Too much alcohol can cause a lack of B vitamins. These vitamins are needed for healthy nerves.  Develop a good support system. Coping with peripheral neuropathy can be stressful. Talk to a mental health specialist or join a support group if you are struggling.  Follow up with your health care provider as directed. SEEK MEDICAL CARE IF:   You have new signs or symptoms of peripheral  neuropathy.  You are struggling emotionally from dealing with peripheral neuropathy.  You have a fever. SEEK IMMEDIATE MEDICAL CARE IF:   You have an injury or infection that is not healing.  You feel very dizzy or begin vomiting.  You have chest pain.  You have trouble breathing.   This information is not intended to replace advice given to you by your health care provider. Make sure you discuss any questions you have with your health care provider.   Document Released: 07/05/2002 Document Revised: 03/27/2011 Document Reviewed: 03/22/2013 Elsevier Interactive Patient Education 2016 [place] [employer] 211 is a great source of information about community services available.  Access by dialing 2-1-1 from anywhere in [place], or by website -  CustodianSupply.fi.   Other Local Resources (Updated 07/2015)  [place]    Phone Number and Address  [facility] medical care - 1st and 3rd Saturday of every month  Must not qualify for public or private insurance and must have limited income [phone] [address], [place]  Child care  Emergency assistance for housing and  [employer]  Medicaid [phone] [address], [place] [address]   [facility] Department  Low-cost medical care for children, communicable diseases, sexually-transmitted diseases, immunizations, maternity care, womens health and family planning [phone] [address], [place] [address]  [facility]   Medication assistance for [facility] residents  Must meet income requirements [phone] [place], [place].    [place]  Child care  Emergency assistance for housing and [employer]  Medicaid [phone] [address] [place], [place] [address]  [facility] and [place]   Low-cost medical care,   Monday through Friday, 9 am to 6 pm.   Accepts Medicare/Medicaid, and self-pay [phone] [address] [place], [place] [address]  [facility] for [place] care - Monday through Friday, 8:30 am - 5:30 pm  Accepts Medicaid and self-pay [phone] [address] [place], [place] [address]   [facility]  Primary medical care, including for those with sickle cell disease  Accepts Medicare, Medicaid, insurance and self-pay [phone] [address], [place]  [facility]   Primary medical care  Accepts Medicare, [place], insurance and self-pay [phone] [address], [place], [place] [address]   [facility] Department of Social Services  Child care  Emergency assistance for housing and [employer]  Medicaid [phone] [place], [place] [address]  [place] Department of Health and [employer]  Child care  Emergency assistance for housing and [employer]  Medicaid [id number] [place], [place] [address]    [facility] Medication Assistance Program  Medication assistance for [facility] residents with no insurance only  Must have a primary care doctor [phone] [address], [place], [place]  [facility]   Primary medical care  [place], [place], insurance  [phone] [address] [place], [place]  [facility]   Medication assistance [phone]  [facility]   Primary medical care  Accepts Medicare, [place], insurance and self-pay [phone] [address], [place] [address]  [facility]  Primary medical care  Accepts Medicare, Medicaid, insurance and self-pay [phone] [address], [place] [address]  [facility]  For [place] residents between the ages of 13 and 5 who do not have any form of health insurance, Medicare, [place], or [place] benefits.  Services are provided free of charge to uninsured patients who fall within federal poverty guidelines.    Hours: Tuesdays and Thursdays, 4:15 - 8 pm [phone] [address] [place], [place] [address]  [facility]     Primary medical care  Dental care  Nutritional counseling  Pharmacy  Accepts Medicaid, Medicare, most insurance.  Fees are adjusted based on ability to pay.   [place], [place] [address], [place], [facility], [place], [facility] [place], [place]  [facility]  Womens health and family planning [phone] [place], [place] care  Emergency assistance  for housing and [employer]  Medicaid [phone] [address], [place], [place] [address]   [facility]    Ages 48 and older  Hours: Mondays and Thursdays, 7:00 am - 9:00 am Patients are seen on a first come, first served basis. [phone], ext. [place], [place]  Child care  Emergency assistance for housing and [employer]  Medicaid [phone] [address], [place] [address]  [facility]  Medication assistance  Rental assistance  Food pantry  Medication assistance  Housing assistance  Emergency food distribution  Utility assistance [address], [place]  [address] [place], [place] [address] Hours: Tuesdays and Thursdays from 9am - 12 noon by appointment only  [place],  [address]  [facility] [place] private insurance, [place], and [place].  Payment is based on a sliding scale for those without insurance.  Hours: Mondays, Tuesdays and Thursdays, 8:30 am - 5:30 pm.   [phone] [place], [place]  [facility] - Family Medicine at [facility], [place], and [place].  Payment is based on a sliding scale for those without insurance. [phone] [address] [place], [place]  [facility] - Pediatrics at [address], research (medical), [employer], and [place].  Payment is based on a sliding scale for those without insurance [phone] [address], [employer], [place]  [facility] - Pediatrics at [employer], [place], and [place].  Payment is based on a sliding scale for those without insurance. [phone] [place], [place]  [facility] - Pediatrics at [facility], [place], and [place].  Payment is based on a sliding scale for those without insurance. [phone], ext. [phone] [place], [place].    [place] care.  Accepts Medicaid and self-pay. [place], [place]

## 2017-01-02 ENCOUNTER — Emergency Department (HOSPITAL_BASED_OUTPATIENT_CLINIC_OR_DEPARTMENT_OTHER)
Admission: EM | Admit: 2017-01-02 | Discharge: 2017-01-02 | Disposition: A | Discharge status: Home / Self Care | Payer: Self-pay | Attending: Emergency Medicine | Admitting: Emergency Medicine

## 2017-01-02 ENCOUNTER — Encounter (HOSPITAL_BASED_OUTPATIENT_CLINIC_OR_DEPARTMENT_OTHER): Payer: Self-pay

## 2017-01-02 DIAGNOSIS — Z7984 Long term (current) use of oral hypoglycemic drugs: Secondary | ICD-10-CM | POA: Insufficient documentation

## 2017-01-02 DIAGNOSIS — M79604 Pain in right leg: Secondary | ICD-10-CM

## 2017-01-02 DIAGNOSIS — G5773 Causalgia of bilateral lower limbs: Secondary | ICD-10-CM | POA: Insufficient documentation

## 2017-01-02 DIAGNOSIS — Z7982 Long term (current) use of aspirin: Secondary | ICD-10-CM | POA: Insufficient documentation

## 2017-01-02 DIAGNOSIS — Z79899 Other long term (current) drug therapy: Secondary | ICD-10-CM | POA: Insufficient documentation

## 2017-01-02 DIAGNOSIS — M79605 Pain in left leg: Secondary | ICD-10-CM

## 2017-01-02 DIAGNOSIS — R739 Hyperglycemia, unspecified: Secondary | ICD-10-CM | POA: Insufficient documentation

## 2017-01-02 DIAGNOSIS — F17210 Nicotine dependence, cigarettes, uncomplicated: Secondary | ICD-10-CM | POA: Insufficient documentation

## 2017-01-02 LAB — COMPREHENSIVE METABOLIC PANEL
ALT: 17 U/L (ref 14–54)
ANION GAP: 9 (ref 5–15)
AST: 16 U/L (ref 15–41)
Albumin: 3.4 g/dL — ABNORMAL LOW (ref 3.5–5.0)
Alkaline Phosphatase: 97 U/L (ref 38–126)
BILIRUBIN TOTAL: 0.6 mg/dL (ref 0.3–1.2)
BUN: 7 mg/dL (ref 6–20)
CO2: 25 mmol/L (ref 22–32)
Calcium: 8.8 mg/dL — ABNORMAL LOW (ref 8.9–10.3)
Chloride: 102 mmol/L (ref 101–111)
Creatinine, Ser: 0.52 mg/dL (ref 0.44–1.00)
GFR calc non Af Amer: >60 mL/min (ref >60)
Glucose, Bld: 323 mg/dL — ABNORMAL HIGH (ref 65–99)
Potassium: 3.4 mmol/L — ABNORMAL LOW (ref 3.5–5.1)
SODIUM: 136 mmol/L (ref 135–145)
TOTAL PROTEIN: 6.4 g/dL — AB (ref 6.5–8.1)

## 2017-01-02 LAB — CBC WITH DIFFERENTIAL/PLATELET
Basophils Absolute: 0 10*3/uL (ref 0.0–0.1)
Basophils Relative: 0 %
EOS ABS: 0.1 10*3/uL (ref 0.0–0.7)
Eosinophils Relative: 1 %
HCT: 37.5 % (ref 36.0–46.0)
Hemoglobin: 13 g/dL (ref 12.0–15.0)
LYMPHS ABS: 2.6 10*3/uL (ref 0.7–4.0)
Lymphocytes Relative: 38 %
MCH: 30.2 pg (ref 26.0–34.0)
MCHC: 34.7 g/dL (ref 30.0–36.0)
MCV: 87.2 fL (ref 78.0–100.0)
MONOS PCT: 10 %
Monocytes Absolute: 0.7 10*3/uL (ref 0.1–1.0)
NEUTROS PCT: 51 %
Neutro Abs: 3.6 10*3/uL (ref 1.7–7.7)
Platelets: 253 10*3/uL (ref 150–400)
RBC: 4.3 MIL/uL (ref 3.87–5.11)
RDW: 13.2 % (ref 11.5–15.5)
WBC: 7 10*3/uL (ref 4.0–10.5)

## 2017-01-02 LAB — CK: CK TOTAL: 53 U/L (ref 38–234)

## 2017-01-02 NOTE — Discharge Instructions (Signed)
Please find a primary care provider for close management of your health.  Your muscle aches may be due to taking Metformin, however your blood sugar and your diabetes are not well controlled.  Therefore, you do need medication to help manage your diabetes. Close management by a primary care provider will help improves your health.

## 2017-01-02 NOTE — ED Triage Notes (Signed)
Pt c/o bilateral leg pain and burning for the last two weeks that is unrelieved by OTC meds

## 2017-01-02 NOTE — ED Provider Notes (Signed)
[facility] DEPT [facility] Provider Note   CSN: [id number] Arrival date & time: 01/02/17  2011  By signing my name below, I, Jeanell Sparrow, attest that this documentation has been prepared under the direction and in the presence of non-physician practitioner, Domenic Moras, PA-C. Electronically Signed: Jeanell Sparrow, Scribe. 01/02/2017. 9:45 PM.  History   Chief Complaint Chief Complaint  Patient presents with  . Leg Pain   The history is provided by the patient. No language interpreter was used.   HPI Comments: Amber Cross is a 56 y.o. female with a PMHx of DM who presents to the Emergency Department complaining of constant moderate BLE pain that started about 2-3 weeks ago. She states she was never evaluated for this complaint and came to the ED today due to pain severity. She took muscle relaxants and ibuprofen PTA without relief. She describes the pain as a burning sensation. She is currently on 1000mg  daily metformin. Denies any new exercise routine, fever, chills, back pain, or other complaints at this time.  Past Medical History:  Diagnosis Date  . Diabetes mellitus   . Hypercholesteremia     There are no active problems to display for this patient.   Past Surgical History:  Procedure Laterality Date  . CATARACT EXTRACTION    . OVARY SURGERY      OB History    No data available       Home Medications    Prior to Admission medications   Medication Sig Start Date End Date Taking? Authorizing Provider  Aspirin-Salicylamide-Caffeine (BC HEADACHE POWDER PO) Take 1 packet by mouth once.    [provider]  diphenhydrAMINE (BENADRYL) 25 MG tablet Take 1 tablet (25 mg total) by mouth every 8 (eight) hours as needed (Take with Reglan for headache as needed. Every 8 hours.). 01/16/16   Charlesetta Shanks, MD  famotidine (PEPCID) 20 MG tablet Take 1 tablet (20 mg total) by mouth 2 (two) times daily as needed for heartburn (upset stomach). 09/29/11 09/28/12  Charlena Cross,  MD  metFORMIN (GLUCOPHAGE) 500 MG tablet Take 1 tablet (500 mg total) by mouth 2 (two) times daily with a meal. 09/29/11   Charlena Cross, MD  metFORMIN (GLUCOPHAGE) 500 MG tablet Take 1 tablet (500 mg total) by mouth 2 (two) times daily with a meal. 01/16/16   Charlesetta Shanks, MD  metoCLOPramide (REGLAN) 10 MG tablet Take 1 tablet (10 mg total) by mouth every 8 (eight) hours as needed for nausea (Take for migraine headache with Benadryl.). 01/16/16   Charlesetta Shanks, MD  omeprazole (PRILOSEC) 20 MG capsule Take 1 capsule (20 mg total) by mouth daily. 01/16/16   Charlesetta Shanks, MD    Family History No family history on file.  Social History Social History  Substance Use Topics  . Smoking status: Current Some Day Smoker    Packs/day: 0.50    Types: Cigarettes  . Smokeless tobacco: Never Used  . Alcohol use No     Allergies   Penicillins   Review of Systems Review of Systems  Constitutional: Negative for chills and fever.  Musculoskeletal: Positive for myalgias. Negative for back pain.  Psychiatric/Behavioral: Negative for confusion.     Physical Exam Updated Vital Signs BP (!) 142/84 (BP Location: Right Arm)   Pulse (!) 116   Temp 98.9 F (37.2 C) (Oral)   Resp 14   Ht 5\' 7"  (1.702 m)   Wt 114 lb (51.7 kg)   SpO2 96%   BMI 17.85 kg/m  Physical Exam  Constitutional: She appears well-developed and well-nourished. No distress.  HENT:  Head: Normocephalic.  Eyes: Conjunctivae are normal.  Neck: Neck supple.  Cardiovascular: Normal rate, regular rhythm and normal heart sounds.   Pulmonary/Chest: Effort normal and breath sounds normal. No respiratory distress. She has no wheezes. She has no rales.  Abdominal: Soft.  Musculoskeletal: Normal range of motion.  No significant midline spinal tenderness.  Diffuse tenderness to BLE on gentle palpation without any muscle wasting. No overlying skin changes. No rash. Leg compartment is soft. DP pulses are palpable. Normal skin  tone. Deep patellar reflexes are intact bilaterally. 5/5 strength to BLE.   Neurological: She is alert.  Skin: Skin is warm and dry.  Psychiatric: She has a normal mood and affect.  Nursing note and vitals reviewed.    ED Treatments / Results  DIAGNOSTIC STUDIES: Oxygen Saturation is 96% on RA, normal by my interpretation.    COORDINATION OF CARE: 9:49 PM- Pt advised of plan for treatment and pt agrees.  Labs (all labs ordered are listed, but only abnormal results are displayed) Labs Reviewed  COMPREHENSIVE METABOLIC PANEL - Abnormal; Notable for the following:       Result Value   Potassium 3.4 (*)    Glucose, Bld 323 (*)    Calcium 8.8 (*)    Total Protein 6.4 (*)    Albumin 3.4 (*)    All other components within normal limits  CBC WITH DIFFERENTIAL/PLATELET  CK    EKG  EKG Interpretation None       Radiology No results found.  Procedures Procedures (including critical care time)  Medications Ordered in ED Medications - No data to display   Initial Impression / Assessment and Plan / ED Course  I have reviewed the triage vital signs and the nursing notes.  Pertinent labs & imaging results that were available during my care of the patient were reviewed by me and considered in my medical decision making (see chart for details).     BP (!) 142/84 (BP Location: Right Arm)   Pulse (!) 116   Temp 98.9 F (37.2 C) (Oral)   Resp 14   Ht 5\' 7"  (1.702 m)   Wt 51.7 kg (114 lb)   SpO2 96%   BMI 17.85 kg/m   11:09 PM Pt here with bilateral leg pain for nearly a month.  She is also has hx of diabetes and currently taking metformin which can cause myalgias.  CBG of 323, no evidence of DKA.  Normal CK, normal H&H and normal WBC.  Doubt infectious etiology.  sxs may also suggest peripheral neuropathy from poorly controlled diabetes.  At this time, I strongly encourage pt to f/u with a primary care provider for better management of her diabetes and overall health.  I  am unsure if Metformin is the sole cause of her myalgias.  Therefore she will need further management.  Otherwise she is able to ambulate without difficulty.  Resource provided for outpt f/u.  Return precaution given.   Final Clinical Impressions(s) / ED Diagnoses   Final diagnoses:  Bilateral leg pain  Hyperglycemia    New Prescriptions New Prescriptions   No medications on file   I personally performed the services described in this documentation, which was scribed in my presence. The recorded information has been reviewed and is accurate.       Domenic Moras, PA-C 01/02/17 2314    Fredia Sorrow, MD 01/04/17 1058

## 2017-01-06 DIAGNOSIS — G629 Polyneuropathy, unspecified: Secondary | ICD-10-CM | POA: Insufficient documentation

## 2017-07-01 DIAGNOSIS — N390 Urinary tract infection, site not specified: Secondary | ICD-10-CM | POA: Insufficient documentation

## 2017-08-23 DIAGNOSIS — K279 Peptic ulcer, site unspecified, unspecified as acute or chronic, without hemorrhage or perforation: Secondary | ICD-10-CM | POA: Insufficient documentation

## 2017-08-23 DIAGNOSIS — K668 Other specified disorders of peritoneum: Secondary | ICD-10-CM | POA: Insufficient documentation

## 2017-08-25 DIAGNOSIS — Z8719 Personal history of other diseases of the digestive system: Secondary | ICD-10-CM | POA: Insufficient documentation

## 2019-04-06 ENCOUNTER — Other Ambulatory Visit: Payer: Self-pay

## 2019-04-06 ENCOUNTER — Ambulatory Visit: Payer: Self-pay | Admitting: Internal Medicine

## 2019-04-06 ENCOUNTER — Encounter: Payer: Self-pay | Admitting: Dietician

## 2019-04-06 ENCOUNTER — Telehealth: Payer: Self-pay | Admitting: Dietician

## 2019-04-06 ENCOUNTER — Ambulatory Visit: Payer: Self-pay | Admitting: Dietician

## 2019-04-06 ENCOUNTER — Encounter: Payer: Self-pay | Admitting: Internal Medicine

## 2019-04-06 VITALS — BP 188/87 | HR 101 | Temp 98.2°F | Ht 67.0 in | Wt 100.3 lb

## 2019-04-06 DIAGNOSIS — I10 Essential (primary) hypertension: Secondary | ICD-10-CM

## 2019-04-06 DIAGNOSIS — E1122 Type 2 diabetes mellitus with diabetic chronic kidney disease: Secondary | ICD-10-CM | POA: Insufficient documentation

## 2019-04-06 DIAGNOSIS — E1142 Type 2 diabetes mellitus with diabetic polyneuropathy: Secondary | ICD-10-CM

## 2019-04-06 DIAGNOSIS — E1165 Type 2 diabetes mellitus with hyperglycemia: Secondary | ICD-10-CM

## 2019-04-06 DIAGNOSIS — E118 Type 2 diabetes mellitus with unspecified complications: Secondary | ICD-10-CM

## 2019-04-06 DIAGNOSIS — R011 Cardiac murmur, unspecified: Secondary | ICD-10-CM

## 2019-04-06 LAB — POCT GLYCOSYLATED HEMOGLOBIN (HGB A1C): HbA1c POC (<> result, manual entry): >14 % — AB (ref 4.0–5.6)

## 2019-04-06 LAB — GLUCOSE, CAPILLARY: Glucose-Capillary: 534 mg/dL (ref 70–99)

## 2019-04-06 MED ORDER — INSULIN GLARGINE 100 UNITS/ML SOLOSTAR PEN: 10.0000 [IU] | PEN_INJECTOR | Freq: Every day | SUBCUTANEOUS | 0 refills | Status: DC

## 2019-04-06 MED ORDER — METFORMIN HCL ER 500 MG PO TB24: ORAL_TABLET | ORAL | 0 refills | Status: DC

## 2019-04-06 MED ORDER — GLUCOSE BLOOD VI STRP: ORAL_STRIP | 12 refills | Status: DC

## 2019-04-06 MED ORDER — INSULIN PEN NEEDLE 32G X 4 MM MISC: 3 refills | Status: DC

## 2019-04-06 MED ORDER — BAYER MICROLET LANCETS MISC
12 refills | Status: DC
Start: 2019-04-06 — End: 2022-03-15

## 2019-04-06 MED FILL — METFORMIN HCL ER 500 MG TAB: 500 | 25 days supply | Qty: 100 | Fill #0

## 2019-04-06 MED FILL — UNIFINE PENTIPS 32GX5/32: 32G X 4 MM | 30 days supply | Qty: 100 | Fill #0

## 2019-04-06 MED FILL — CONTOUR NEXT STRIPS: 30 days supply | Qty: 100 | Fill #0

## 2019-04-06 MED FILL — CONTOUR NEXT METER: W/DEVICE | 30 days supply | Qty: 1 | Fill #0

## 2019-04-06 MED FILL — MICROLET LANCETS MISC: 30 days supply | Qty: 100 | Fill #0

## 2019-04-06 MED FILL — LANTUS SOLOSTAR 100 UNITS/M: 100 | 30 days supply | Qty: 3 | Fill #0

## 2019-04-06 MED FILL — UNIFINE PENTIPS 32GX5/32": 32G X 4 MM | 30 days supply | Qty: 100 | Fill #0

## 2019-04-06 NOTE — Telephone Encounter (Signed)
Request prescriptions for diabetes supplies

## 2019-04-06 NOTE — Progress Notes (Signed)
New Patient Office Visit  Subjective:  Patient ID: Amber Cross, female    DOB: April 27, 1961  Age: 58 y.o. MRN: [id number]  CC:  Chief Complaint  Patient presents with  . Diabetes    NEW PATIENT TO GET ESTABLISHED  / FOOT CHECK DONE  . Pain    BILATEAL LEG PAIN FOR ABOUT 6 MONTHS    HPI Amber Cross presents to establish care, T2DM  Please see assessment and plan for HPI in problem based charting format.   Past Medical History:  Diagnosis Date  . Diabetes mellitus   . Hypercholesteremia   . Hypertension     Past Surgical History:  Procedure Laterality Date  . ANTERIOR CRUCIATE LIGAMENT REPAIR Right   . CATARACT EXTRACTION    . OVARY SURGERY    . RIGHT OOPHORECTOMY Right   . SMALL INTESTINE SURGERY    . TUBAL LIGATION      Family History  Problem Relation Age of Onset  . Diabetes Mother   . Hypertension Mother   . Hypertension Father     Social History   Socioeconomic History  . Marital status: Married    Spouse name: Not on file  . Number of children: Not on file  . Years of education: Not on file  . Highest education level: Not on file  Occupational History  . Not on file  Social Needs  . Financial resource strain: Not on file  . Food insecurity    Worry: Not on file    Inability: Not on file  . Transportation needs    Medical: Not on file    Non-medical: Not on file  Tobacco Use  . Smoking status: Current Some Day Smoker    Packs/day: 0.50    Years: 40.00    Pack years: 20.00    Types: Cigarettes  . Smokeless tobacco: Never Used  Substance and Sexual Activity  . Alcohol use: Yes    Alcohol/week: 1.0 standard drinks    Types: 1 Glasses of wine per week  . Drug use: No  . Sexual activity: Not Currently    Birth control/protection: Abstinence  Lifestyle  . Physical activity    Days per week: Not on file    Minutes per session: Not on file  . Stress: Not on file  Relationships  . Social Herbalist on phone: Not on file    Gets together: Not on file    Attends religious service: Not on file    Active member of club or organization: Not on file    Attends meetings of clubs or organizations: Not on file    Relationship status: Not on file  . Intimate partner violence    Fear of current or ex partner: Not on file    Emotionally abused: Not on file    Physically abused: Not on file    Forced sexual activity: Not on file  Other Topics Concern  . Not on file  Social History Narrative  . Not on file    ROS Review of Systems  Constitutional: Negative for diaphoresis.  HENT: Negative for sore throat and trouble swallowing.   Eyes: Negative for discharge and itching.  Respiratory: Negative for shortness of breath.   Cardiovascular: Negative for chest pain.  Gastrointestinal: Negative for blood in stool.  Genitourinary: Negative for difficulty urinating.  Musculoskeletal: Negative for neck stiffness.  Skin: Negative for rash and wound.  Neurological: Negative for tremors and syncope.    Objective:  Today's Vitals: BP (!) 188/87   Pulse (!) 101   Temp 98.2 F (36.8 C) (Oral)   Ht '5\' 7"'  (1.702 m)   Wt 100 lb 4.8 oz (45.5 kg)   SpO2 100%   BMI 15.71 kg/m   Physical Exam Constitutional:      General: She is not in acute distress.    Appearance: She is not diaphoretic.  Eyes:     General: No scleral icterus.       Right eye: No discharge.        Left eye: No discharge.  Cardiovascular:     Rate and Rhythm: Normal rate and regular rhythm.     Heart sounds: Murmur (1/6 likely MR) present. No friction rub. No gallop.   Pulmonary:     Effort: Pulmonary effort is normal. No respiratory distress.     Breath sounds: Normal breath sounds. No wheezing or rales.  Chest:     Chest wall: No tenderness.  Abdominal:     General: Bowel sounds are normal. There is no distension.     Palpations: Abdomen is soft. There is no mass.     Tenderness: There is no abdominal tenderness. There is no guarding or  rebound.  Musculoskeletal:     Right lower leg: No edema.     Left lower leg: No edema.  Neurological:     Mental Status: She is alert.    Assessment & Plan:   Problem List Items Addressed This Visit      Endocrine   Type 2 diabetes mellitus with peripheral neuropathy [facility])    Lab Results  Component Value Date   HGBA1C  04/06/2019     Comment:     CBG Critical High  534  reported to Lela S NT 14:04 04/06/19/20  T Fulcher, PBT   HGBA1C >14.0 (A) 04/06/2019   Pt diagnosed with T2DM at age 19. Is currently off all medication for diabetes.  Felt she was losing weight off metformin so self disontinued. By chart review has been poorly controlled for some time.  No nausea, vomiting or feeling poorly although she is very upset to hear about her diabetes progression.  She reports frequent urination but is able to stay well hydrated.  Her weight is 100lbs today BMI of 15 which may partially be explained by uncontrolled diabetes.  Seems to be chronic as she was 106 lbs two years ago she reports 140lbs being her healthy weight.  She has neuropathy in bilateral lower extremities today.    -start pt back on metformin ramp up -start lantus 10u daily qhs -she will see our diabetes educator today -check renal function and cbc today -will follow renal function will likely need gabapentin for neuropathy symptoms         Relevant Medications   metFORMIN (GLUCOPHAGE-XR) 500 MG 24 hr tablet   insulin glargine (LANTUS) 100 unit/mL SOPN    Other Visit Diagnoses    Essential hypertension    -  Primary      Outpatient Encounter Medications as of 04/06/2019  Medication Sig  . Aspirin-Salicylamide-Caffeine (BC HEADACHE POWDER PO) Take 1 packet by mouth once.  . diphenhydrAMINE (BENADRYL) 25 MG tablet Take 1 tablet (25 mg total) by mouth every 8 (eight) hours as needed (Take with Reglan for headache as needed. Every 8 hours.).  Marland Kitchen famotidine (PEPCID) 20 MG tablet Take 1 tablet (20 mg total) by mouth 2  (two) times daily as needed for heartburn (upset stomach).  Marland Kitchen  insulin glargine (LANTUS) 100 unit/mL SOPN Inject 0.1 mLs (10 Units total) into the skin at bedtime.  . metFORMIN (GLUCOPHAGE-XR) 500 MG 24 hr tablet 515m with dinner x7 days, 5074mtwice daily with meals x7 days, 50017m/breakfast and 1000m43mth supper x7 days and 1000mg15mreakfast and 1000mg 63m supper indefinitely.  . metoCLOPramide (REGLAN) 10 MG tablet Take 1 tablet (10 mg total) by mouth every 8 (eight) hours as needed for nausea (Take for migraine headache with Benadryl.).  . omepMarland Kitchenazole (PRILOSEC) 20 MG capsule Take 1 capsule (20 mg total) by mouth daily.  . [DISCONTINUED] metFORMIN (GLUCOPHAGE) 500 MG tablet Take 1 tablet (500 mg total) by mouth 2 (two) times daily with a meal.  . [DISCONTINUED] metFORMIN (GLUCOPHAGE) 500 MG tablet Take 1 tablet (500 mg total) by mouth 2 (two) times daily with a meal.   No facility-administered encounter medications on file as of 04/06/2019.     Follow-up: Return in about 10 days (around 04/16/2019).   BrandoVickki Muff

## 2019-04-06 NOTE — Patient Instructions (Addendum)
Hi Amber Cross we have started you back on metformin and started you on 10 units of lantus (a long acting insulin) daily at bedtime.  Please check your blood sugar first thing in the morning and before you go to bed for the next few weeks.  Please come back in around two weeks for a follow up appointment.

## 2019-04-06 NOTE — Progress Notes (Signed)
Diabetes Self-Management Education  Visit Type: First/Initial  Appt. Start Time: 1520 Appt. End Time: 6269  04/06/2019  Ms. Amber Cross, identified by name and date of birth, is a 58 y.o. female with a diagnosis of Diabetes: Type 2(no testing for type 1 noted).   ASSESSMENT  Amber Cross injected herself with saline today, then practiced with an insulin pen once saying she felt comfortable using it. She reports her usual weight was ~180#, she lost to 150 and her diabetes was better controlled. Then lost down to current weight which is concisten with  moderate to severe malnutrition.   Wt Readings from Last 5 Encounters:  04/06/19 100 lb 4.8 oz (45.5 kg)  01/02/17 114 lb (51.7 kg)  01/16/16 100 lb (45.4 kg)  09/29/11 164 lb (74.4 kg)   Lab Results  Component Value Date   HGBA1C  04/06/2019     Comment:     CBG Critical High  534  reported to Lela S NT 14:04 04/06/19/20  T Fulcher, PBT   HGBA1C >14.0 (A) 04/06/2019   HGBA1C 8.1 (H) 07/11/2010      Diabetes Self-Management Education - 04/06/19 1700      Visit Information   Visit Type  First/Initial      Initial Visit   Diabetes Type  Type 2   no testing for type 1 noted   Are you currently following a meal plan?  No    Are you taking your medications as prescribed?  No    Date Diagnosed  --   17-18 years ago when she was about 41     Health Coping   How would you rate your overall health?  Fair      Psychosocial Assessment   Patient Belief/Attitude about Diabetes  Afraid    Self-care barriers  Lack of material resources    Patient Concerns  Medication;Monitoring;Problem Solving;Weight Control    Special Needs  None    Preferred Learning Style  Hands on    Learning Readiness  Ready    How often do you need to have someone help you when you read instructions, pamphlets, or other written materials from your doctor or pharmacy?  2 - Rarely      Pre-Education Assessment   Patient understands using medications safely.   Needs Instruction    Patient understands monitoring blood glucose, interpreting and using results  Needs Review    Patient understands prevention, detection, and treatment of acute complications.  Needs Instruction      Complications   Last HgB A1C per patient/outside source  14 %    How often do you check your blood sugar?  0 times/day (not testing)    Fasting Blood glucose range (mg/dL)  >200    Postprandial Blood glucose range (mg/dL)  >200    Number of hypoglycemic episodes per month  0    Number of hyperglycemic episodes per week  100      Patient Education   Medications  Taught/reviewed insulin injection, site rotation, insulin storage and needle disposal.;Reviewed patients medication for diabetes, action, purpose, timing of dose and side effects.    Monitoring  Taught/evaluated SMBG meter.    Acute complications  Taught treatment of hypoglycemia - the 15 rule.;Discussed and identified patients' treatment of hyperglycemia.      Individualized Goals (developed by patient)   Medications  take my medication as prescribed      Outcomes   Expected Outcomes  Demonstrated interest in learning. Expect positive outcomes  Future DMSE  4-6 wks    Program Status  Completed       Individualized Plan for Diabetes Self-Management Training:   Learning Objective:  Patient will have a greater understanding of diabetes self-management. Patient education plan is to attend individual and/or group sessions per assessed needs and concerns.   Plan:    Expected Outcomes:  Demonstrated interest in learning. Expect positive outcomes  Education material provided: Diabetes Resources  If problems or questions, patient to contact team via:  Phone  Future DSME appointment: 4-6 wks  Debera Lat, RD 04/06/2019 5:52 PM.

## 2019-04-06 NOTE — Assessment & Plan Note (Signed)
Lab Results  Component Value Date   HGBA1C  04/06/2019     Comment:     CBG Critical High  534  reported to Lela S NT 14:04 04/06/19/20  T Fulcher, PBT   HGBA1C >14.0 (A) 04/06/2019   Pt diagnosed with T2DM at age 58. Is currently off all medication for diabetes.  Felt she was losing weight off metformin so self disontinued. By chart review has been poorly controlled for some time.  No nausea, vomiting or feeling poorly although she is very upset to hear about her diabetes progression.  She reports frequent urination but is able to stay well hydrated.  Her weight is 100lbs today BMI of 15 which may partially be explained by uncontrolled diabetes.  Seems to be chronic as she was 106 lbs two years ago she reports 140lbs being her healthy weight.  She has neuropathy in bilateral lower extremities today.    -start pt back on metformin ramp up -start lantus 10u daily qhs -she will see our diabetes educator today -check renal function and cbc today -will follow renal function will likely need gabapentin for neuropathy symptoms

## 2019-04-06 NOTE — Patient Instructions (Signed)
Ms. Saffron, Busey to meet you! I think you will do fine with caring for your diabetes and hoping you feel better soon.   Check blood sugar as instructed. You can get more lancets and strips at [facility].  Increase metformin ER as instructed.   Inject 10 units of Lantus once time a day as instructed. You can pick up Lantus insulin and pen needles at [place].   Butch Penny [phone]

## 2019-04-07 LAB — BMP8+ANION GAP
Anion Gap: 17 mmol/L (ref 10.0–18.0)
BUN/Creatinine Ratio: 14 (ref 9–23)
BUN: 8 mg/dL (ref 6–24)
CO2: 23 mmol/L (ref 20–29)
Calcium: 9.6 mg/dL (ref 8.7–10.2)
Chloride: 93 mmol/L — ABNORMAL LOW (ref 96–106)
Creatinine, Ser: 0.56 mg/dL — ABNORMAL LOW (ref 0.57–1.00)
GFR calc Af Amer: 120 mL/min/{1.73_m2} (ref >59)
GFR calc non Af Amer: 104 mL/min/{1.73_m2} (ref >59)
Glucose: 468 mg/dL — ABNORMAL HIGH (ref 65–99)
Potassium: 4.2 mmol/L (ref 3.5–5.2)
Sodium: 133 mmol/L — ABNORMAL LOW (ref 134–144)

## 2019-04-07 LAB — CBC
Hematocrit: 45.9 % (ref 34.0–46.6)
Hemoglobin: 14.5 g/dL (ref 11.1–15.9)
MCH: 29.1 pg (ref 26.6–33.0)
MCHC: 31.6 g/dL (ref 31.5–35.7)
MCV: 92 fL (ref 79–97)
Platelets: 306 10*3/uL (ref 150–450)
RBC: 4.98 x10E6/uL (ref 3.77–5.28)
RDW: 13 % (ref 11.7–15.4)
WBC: 8.8 10*3/uL (ref 3.4–10.8)

## 2019-04-07 NOTE — Progress Notes (Signed)
Internal Medicine Clinic Attending  Case discussed with Dr. Winfrey  at the time of the visit.  We reviewed the resident's history and exam and pertinent patient test results.  I agree with the assessment, diagnosis, and plan of care documented in the resident's note.  

## 2019-04-09 ENCOUNTER — Telehealth: Payer: Self-pay | Admitting: Dietician

## 2019-04-09 NOTE — Telephone Encounter (Signed)
Line was busy

## 2019-04-14 ENCOUNTER — Encounter: Payer: Self-pay | Admitting: Dietician

## 2019-04-14 NOTE — Telephone Encounter (Signed)
Line was busy. Will mail letter. Debera Lat, RD 04/14/2019 11:42 AM.

## 2019-04-27 ENCOUNTER — Ambulatory Visit: Payer: Self-pay

## 2019-05-03 ENCOUNTER — Encounter: Payer: Self-pay | Admitting: Internal Medicine

## 2019-05-03 ENCOUNTER — Ambulatory Visit: Payer: Self-pay | Admitting: Internal Medicine

## 2019-05-03 ENCOUNTER — Other Ambulatory Visit: Payer: Self-pay

## 2019-05-03 DIAGNOSIS — Z79899 Other long term (current) drug therapy: Secondary | ICD-10-CM

## 2019-05-03 DIAGNOSIS — Z794 Long term (current) use of insulin: Secondary | ICD-10-CM

## 2019-05-03 DIAGNOSIS — I10 Essential (primary) hypertension: Secondary | ICD-10-CM | POA: Insufficient documentation

## 2019-05-03 DIAGNOSIS — E1165 Type 2 diabetes mellitus with hyperglycemia: Secondary | ICD-10-CM

## 2019-05-03 DIAGNOSIS — E1142 Type 2 diabetes mellitus with diabetic polyneuropathy: Secondary | ICD-10-CM

## 2019-05-03 MED ORDER — INSULIN GLARGINE 100 UNITS/ML SOLOSTAR PEN: 10.0000 [IU] | PEN_INJECTOR | Freq: Every day | SUBCUTANEOUS | 2 refills | Status: DC

## 2019-05-03 MED ORDER — GABAPENTIN 300 MG PO CAPS: 300.0000 mg | ORAL_CAPSULE | Freq: Every day | ORAL | 3 refills | Status: DC

## 2019-05-03 MED ORDER — INSULIN GLARGINE 100 UNITS/ML SOLOSTAR PEN: 10.0000 [IU] | PEN_INJECTOR | Freq: Every day | SUBCUTANEOUS | 1 refills | Status: DC

## 2019-05-03 MED FILL — GABAPENTIN 300 MG CAPSULE: 300 | 90 days supply | Qty: 90 | Fill #0

## 2019-05-03 MED FILL — LANTUS SOLOSTAR 100 UNITS/M: 100 | 30 days supply | Qty: 3 | Fill #0

## 2019-05-03 NOTE — Progress Notes (Signed)
   CC: Diabetes and peripheral neuropathy   HPI:  [name] is a 58 y.o. with past medical history listed below. Patient comes in after being started on Insulin review blood glucose. Please see problem based charting for details of presentation, assessment, and plan.   Past Medical History:  Diagnosis Date  . Diabetes mellitus   . Hypercholesteremia   . Hypertension    Review of Systems: Review of Systems  Eyes: Negative for blurred vision and double vision.  Neurological: Positive for sensory change (neuropathy in legs ). Negative for dizziness and headaches.     Vitals:   05/03/19 1526  BP: (!) 143/76  Pulse: 99  Weight: 106 lb 12.8 oz (48.4 kg)   Physical Exam:  Physical Exam Constitutional:      Appearance: Normal appearance.  Cardiovascular:     Rate and Rhythm: Normal rate and regular rhythm.  Pulmonary:     Effort: Pulmonary effort is normal.     Breath sounds: No wheezing, rhonchi or rales.  Abdominal:     General: Bowel sounds are normal. There is no distension.  Neurological:     Mental Status: She is alert.  Psychiatric:        Mood and Affect: Mood normal.        Behavior: Behavior normal.      Assessment & Plan:   See Encounters Tab for problem based charting.  Patient seen with Dr. Daryll Drown

## 2019-05-03 NOTE — Patient Instructions (Addendum)
Thank you for trusting me with your care. To recap, today we discussed the following:   Type 2 diabetes mellitus - Continue Lantus 10 units  - Continue Metformin  - Gabapentin  - Follow up in 2 months to check Hemoglobin A1C, check blood work, and blood pressure.  - Keep up the good work on your nutrition!  My best,  Tamsen Snider, MD

## 2019-05-03 NOTE — Assessment & Plan Note (Signed)
Patient 143/76. Says she usually has normal BP and thinks this is elevated from walk in. Check on next visit and consider starting ACE.

## 2019-05-03 NOTE — Assessment & Plan Note (Signed)
Pt here for follow up of T2DM. She reports her blood glucose is around 120 in the morning. Her highest blood glucose has been 20 and denies any episodes of hypoglycemia. She is on 10 units of lantus and 1500 mg of Metformin. She has peripheral neuropathy . She denies any changes in vision and has yearly eye exams.  - Cr normal on previous labs, on next visit check microalbuminuria to creatine ratio - Continue Lantus 10 units - Continue Metformin  - Start Gabapentin 300 mg daily - Follow up in 2 months for Hgb A1C

## 2019-05-04 NOTE — Progress Notes (Signed)
Internal Medicine Clinic Attending  I saw and evaluated the patient.  I personally confirmed the key portions of the history and exam documented by Dr. Steen and I reviewed pertinent patient test results.  The assessment, diagnosis, and plan were formulated together and I agree with the documentation in the resident's note.     

## 2019-05-10 ENCOUNTER — Other Ambulatory Visit: Payer: Self-pay

## 2019-05-10 DIAGNOSIS — E1165 Type 2 diabetes mellitus with hyperglycemia: Secondary | ICD-10-CM

## 2019-05-10 MED ORDER — METFORMIN HCL ER 500 MG PO TB24: ORAL_TABLET | ORAL | 0 refills | Status: DC

## 2019-05-10 MED FILL — CONTOUR NEXT STRIPS: 30 days supply | Qty: 100 | Fill #1

## 2019-05-10 MED FILL — MICROLET LANCETS MISC: 30 days supply | Qty: 100 | Fill #1

## 2019-05-10 NOTE — Telephone Encounter (Signed)
Refilled

## 2019-05-10 NOTE — Telephone Encounter (Signed)
Confirmed with [facility] they have Rxs for lancets and test strips and will get them ready for her. Print option was selected on Rx for metformin written 04/22/2019. They cannot take a VO for this since it is on IM Program. L. Paticia Moster, BSN, RN-BC

## 2019-05-10 NOTE — Telephone Encounter (Signed)
metFORMIN (GLUCOPHAGE-XR) 500 MG 24 hr tablet    glucose blood test strip  [employer] Lancets lancets, REFILL REQUEST @  [place], [place] - [address]. [phone] (Phone) [phone] (Fax)

## 2019-05-12 ENCOUNTER — Other Ambulatory Visit: Payer: Self-pay | Admitting: Dietician

## 2019-05-12 DIAGNOSIS — E1165 Type 2 diabetes mellitus with hyperglycemia: Secondary | ICD-10-CM

## 2019-05-12 MED ORDER — METFORMIN HCL ER 500 MG PO TB24: ORAL_TABLET | ORAL | 0 refills | Status: DC

## 2019-05-24 ENCOUNTER — Encounter: Payer: Self-pay | Admitting: Internal Medicine

## 2019-05-24 ENCOUNTER — Ambulatory Visit: Payer: Self-pay

## 2019-06-10 MED FILL — LANTUS SOLOSTAR 100 UNITS/M: 100 | 30 days supply | Qty: 3 | Fill #1

## 2019-07-13 MED FILL — UNIFINE PENTIPS 32GX5/32": 32G X 4 MM | 30 days supply | Qty: 100 | Fill #1

## 2019-07-13 MED FILL — GABAPENTIN 300 MG CAPSULE: 300 | 90 days supply | Qty: 90 | Fill #1

## 2019-07-13 MED FILL — CONTOUR NEXT STRIPS: 30 days supply | Qty: 100 | Fill #2

## 2019-07-13 MED FILL — UNIFINE PENTIPS 32GX5/32: 32G X 4 MM | 30 days supply | Qty: 100 | Fill #1

## 2019-07-13 MED FILL — LANTUS SOLOSTAR 100 UNITS/M: 100 | 30 days supply | Qty: 3 | Fill #2

## 2019-07-14 ENCOUNTER — Other Ambulatory Visit: Payer: Self-pay | Admitting: Internal Medicine

## 2019-07-14 DIAGNOSIS — E1165 Type 2 diabetes mellitus with hyperglycemia: Secondary | ICD-10-CM

## 2019-07-14 MED FILL — MICROLET LANCETS MISC: 30 days supply | Qty: 100 | Fill #2

## 2019-07-16 MED FILL — metFORMIN HCL 1000 MG TABS: 1000 | 90 days supply | Qty: 180 | Fill #0

## 2019-07-19 ENCOUNTER — Ambulatory Visit: Payer: Self-pay | Admitting: Internal Medicine

## 2019-07-19 VITALS — BP 169/76 | HR 102 | Temp 98.0°F | Wt 110.6 lb

## 2019-07-19 DIAGNOSIS — Z720 Tobacco use: Secondary | ICD-10-CM

## 2019-07-19 DIAGNOSIS — E1142 Type 2 diabetes mellitus with diabetic polyneuropathy: Secondary | ICD-10-CM

## 2019-07-19 DIAGNOSIS — Z794 Long term (current) use of insulin: Secondary | ICD-10-CM

## 2019-07-19 DIAGNOSIS — F17210 Nicotine dependence, cigarettes, uncomplicated: Secondary | ICD-10-CM

## 2019-07-19 DIAGNOSIS — R634 Abnormal weight loss: Secondary | ICD-10-CM

## 2019-07-19 DIAGNOSIS — Z681 Body mass index (BMI) 19 or less, adult: Secondary | ICD-10-CM

## 2019-07-19 DIAGNOSIS — I10 Essential (primary) hypertension: Secondary | ICD-10-CM

## 2019-07-19 DIAGNOSIS — Z79899 Other long term (current) drug therapy: Secondary | ICD-10-CM

## 2019-07-19 LAB — POCT GLYCOSYLATED HEMOGLOBIN (HGB A1C): Hemoglobin A1C: 9 % — AB (ref 4.0–5.6)

## 2019-07-19 LAB — GLUCOSE, CAPILLARY: Glucose-Capillary: 134 mg/dL — ABNORMAL HIGH (ref 70–99)

## 2019-07-19 MED ORDER — CANAGLIFLOZIN 100 MG PO TABS: 100.0000 mg | ORAL_TABLET | Freq: Every day | ORAL | 0 refills | Status: DC

## 2019-07-19 MED FILL — INVOKANA 100 MG TABLET: 100 | 30 days supply | Qty: 30 | Fill #0

## 2019-07-19 NOTE — Progress Notes (Signed)
   CC: DM, HTN, weight loss  HPI:  Amber Cross is a 58 y.o. female with PMHx listed below presenting for DM, HTN, weight loss. Please see the A&P for the status of the patient's chronic medical problems.  Past Medical History:  Diagnosis Date  . Diabetes mellitus   . Hypercholesteremia   . Hypertension    Review of Systems:  Performed and all others negative.  Physical Exam: Vitals:   07/19/19 1534  BP: (!) 169/76  Pulse: (!) 102  Temp: 98 F (36.7 C)  TempSrc: Oral  SpO2: 100%  Weight: 110 lb 9.6 oz (50.2 kg)   General: thin female in no acute distress Pulm: Good air movement with no wheezing or crackles  CV: RRR, no murmurs, no rubs   Assessment & Plan:   See Encounters Tab for problem based charting.  Patient discussed with Dr. Evette Doffing

## 2019-07-19 NOTE — Assessment & Plan Note (Addendum)
>>  ASSESSMENT AND PLAN FOR TYPE 2 DIABETES MELLITUS WITH DIABETIC POLYNEUROPATHY (HCC) WRITTEN ON 07/20/2019  1:11 PM BY March Joos, MD  Patient with uncontrolled DM. Last A1c > 14.0. She was diagnosed at the age of 43 when she presented to her PCP office with elevated CBGs. She was started on metformin and glipizide but did not take it consistently and has always been uncontrolled. She has never been hospitalized for DKA or HHS or hyperglycemia. She does have recurrent yeast infections whenever she stops her medications. She was does eat a lot of candy because she wants to put on weight. At her last visit her glipizide was stopped and she was started on Lantus 20 units every day with her metformin. She has not missed any doses. She did not bring her meter but reports her CBGs have been between 100-200 since starting the lantus. She has not had hypoglycemia. Her goal is to get off injection medications as she does not like to inject herself.   On review of history. She has no personal or family history thyroid or pancreatic cancer. She does has a family history of DM.   A/P: - Uncontrolled but A1c has decreased to 9.0 from > 14.0  - Discussed lifestyle changes and elimination of candy from her diet.  - Will continue Lantus 20 units every day - Will continue Metformin 1000 mg every day - Start Invokana 100 mg QD - Start Lisinopril 20 mg every day  - Will need at least a moderate statin at her next visit  - Foot exam performed - Return in 3 months  >>ASSESSMENT AND PLAN FOR DIABETIC NEUROPATHY (HCC)  WRITTEN ON 07/20/2019  1:13 PM BY Presli Fanguy, MD  Uncontrolled neuropathy of her feet. She is on gabapentin 300 mg BID. She has a lot of difficulty sleeping because of the pain. She feels a burning sensation with ambulation. She has had this for years.   A/P: - Discussed the need to control her DM to prevent progression  - Will increase gabapentin to 300 mg QAM and 600 mg at bedtime. She  can increase to 300 mg QAM, 300 mg at lunch, and 600 mg at bedtime if her pain remains uncontrolled after 2 weeks.  - Discussed side effects of gabapentin including fatigue, difficulties thinking. She will call if she experiences any of theses.

## 2019-07-19 NOTE — Patient Instructions (Addendum)
Thank you for allowing [place] to provide your care. Today we're gonna be starting a new medication called Invokana. Please take this as prescribed.   For your neuropathy you can increase her gabapentin to 300 in the morning and 600 at night. Continue this for the next two weeks and if you are still having significant pain you can start 300 in the morning, 300 with lunch, and 600 at night. If your pain persists please call us for further instructions.  We also gonna check some of your urine. This will help [place] to determine what type of blood pressure medication you should be on. I will call you once we have these results and we can further discuss what medications to start.  Please come back to see [place] in three months or sooner if any issues arise.

## 2019-07-20 DIAGNOSIS — E1140 Type 2 diabetes mellitus with diabetic neuropathy, unspecified: Secondary | ICD-10-CM | POA: Insufficient documentation

## 2019-07-20 DIAGNOSIS — Z720 Tobacco use: Secondary | ICD-10-CM | POA: Insufficient documentation

## 2019-07-20 DIAGNOSIS — R634 Abnormal weight loss: Secondary | ICD-10-CM | POA: Insufficient documentation

## 2019-07-20 LAB — MICROALBUMIN / CREATININE URINE RATIO
Creatinine, Urine: 190.7 mg/dL
Microalb/Creat Ratio: 318 mg/g creat — ABNORMAL HIGH (ref 0–29)
Microalbumin, Urine: 606.4 ug/mL

## 2019-07-20 MED ORDER — LISINOPRIL 20 MG PO TABS: 20.0000 mg | ORAL_TABLET | Freq: Every day | ORAL | 1 refills | Status: DC

## 2019-07-20 MED FILL — LISINOPRIL 20 MG TABLET: 20 | 30 days supply | Qty: 30 | Fill #0

## 2019-07-20 NOTE — Assessment & Plan Note (Signed)
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 She is on gabapentin 300 mg BID. She has a lot of difficulty sleeping because of the pain. She feels a burning sensation with ambulation. She has had this for years.   A/P: - Discussed the need to control her DM to prevent progression  - Will increase gabapentin to 300 mg QAM and 600 mg at bedtime. She can increase to 300 mg QAM, 300 mg at lunch, and 600 mg at bedtime if her pain remains uncontrolled after 2 weeks.  - Discussed side effects of gabapentin including fatigue, difficulties thinking. She will call if she experiences any of theses.

## 2019-07-20 NOTE — Assessment & Plan Note (Signed)
Patient with > 50 lbs weight loss of 4-5 years. Has been unintentional. She has access to food but states she does not have a desire to eat. She does acknowledge that her desire has improved since getting better control of her DM. She denies dysphagia, early satiety, abdominal pain, or diarrhea with eating. She has not had a colonoscopy, PAP, or CT chest.   A/P: - Discussed the need for age and risk factor appropriate screening including colonoscopy, PAP, and CT chest. Finances are an issue. She will reach out to the clinic about financial counseling

## 2019-07-20 NOTE — Assessment & Plan Note (Signed)
Smokes 1/2 PPD. Discussed cessation.

## 2019-07-20 NOTE — Progress Notes (Signed)
Internal Medicine Clinic Attending  Case discussed with Dr. Helberg at the time of the visit.  We reviewed the resident's history and exam and pertinent patient test results.  I agree with the assessment, diagnosis, and plan of care documented in the resident's note.    

## 2019-07-20 NOTE — Assessment & Plan Note (Signed)
Patient with elevated BP at her last 2 clinic visits. She denies headaches, visual changes, SHOB, CP, abdominal pain. She feels fine. She has never been told she has HTN.   A/P: - Discussed the diagnosis of HTN  - Given her DM we checked a microalbumin to creatinine ratio which was elevated. Typically 2 abnormal tests would be needed to diagnosis diabetic nephropathy; however, given her degree of elevation and that she has a positive test we will go ahead and start lisinopril 20 mg every day  - She will need repeat lab testing in 4 weeks

## 2019-08-18 MED FILL — LISINOPRIL 20 MG TABLET: 20 | 30 days supply | Qty: 30 | Fill #1

## 2019-08-18 MED FILL — LANTUS SOLOSTAR 100 UNITS/M: 100 | 30 days supply | Qty: 3 | Fill #3

## 2019-09-14 MED FILL — LISINOPRIL 20 MG TABLET: 20 | 30 days supply | Qty: 30 | Fill #2

## 2019-09-14 MED FILL — GABAPENTIN 300 MG CAPSULE: 300 | 90 days supply | Qty: 90 | Fill #2

## 2019-09-20 ENCOUNTER — Ambulatory Visit (INDEPENDENT_AMBULATORY_CARE_PROVIDER_SITE_OTHER): Payer: Self-pay | Admitting: Internal Medicine

## 2019-09-20 ENCOUNTER — Encounter: Payer: Self-pay | Admitting: Internal Medicine

## 2019-09-20 ENCOUNTER — Other Ambulatory Visit: Payer: Self-pay | Admitting: Internal Medicine

## 2019-09-20 DIAGNOSIS — Z79899 Other long term (current) drug therapy: Secondary | ICD-10-CM

## 2019-09-20 DIAGNOSIS — Z794 Long term (current) use of insulin: Secondary | ICD-10-CM

## 2019-09-20 DIAGNOSIS — E1142 Type 2 diabetes mellitus with diabetic polyneuropathy: Secondary | ICD-10-CM

## 2019-09-20 MED ORDER — GABAPENTIN 300 MG PO CAPS: 600.0000 mg | ORAL_CAPSULE | Freq: Three times a day (TID) | ORAL | 3 refills | Status: DC

## 2019-09-20 MED ORDER — CANAGLIFLOZIN 100 MG PO TABS: 100.0000 mg | ORAL_TABLET | Freq: Every day | ORAL | 0 refills | Status: DC

## 2019-09-20 MED FILL — INVOKANA 100 MG TABLET: 100 | 30 days supply | Qty: 30 | Fill #0

## 2019-09-20 NOTE — Assessment & Plan Note (Addendum)
>>  ASSESSMENT AND PLAN FOR TYPE 2 DIABETES MELLITUS WITH DIABETIC POLYNEUROPATHY (HCC) WRITTEN ON 09/20/2019  4:20 PM BY Julien Oscar K, MD  Type 2 diabetes mellitus: She was previously uncontrolled with an A1c greater than 14.  With medication adjustments and compliance, her A1c improved to 9% when it was last checked several months ago.  During her last visit, Invokana 100 mg daily was added to her regimen however she reports that she stopped taking the medicine about 2 weeks after the start date due to hypoglycemic episodes.  She states her CBG was consistently in the 70s during that time and she was asymptomatic.  Since then, her CBGs has been in the 80s-200s.  Data from her downloaded glucometer as follows: Minimum 74 mg/dL, maximum 184 mg/dL, in target 85%, below target 0%, above target 50%.  Average morning reading 142 mg/dL, average midday 927 mg/dL, evening to 81 mg/dL.  She does tell me that she eats snacks and regular meals in the evening.   Plan: -Continue Lantus 20 units daily -Continue Metformin 1000 mg daily -Continue home blood glucose monitoring -Advised to resume Invokana 100 mg daily as per home blood glucose log does not show any hypoglycemia (had a discussion that she could take Invokana every other day for a week and then resume daily)  >>ASSESSMENT AND PLAN FOR DIABETIC NEUROPATHY (HCC)  WRITTEN ON 09/20/2019  4:01 PM BY Aubrionna Istre K, MD  She reports over 2 to 3-week complaint of left lateral thigh pain that is very sensitive to touch.  She describes nerve pain that is located at the anterior aspect of her left thigh.  During her last visit, gabapentin was increased to 600 mg twice daily.  She however reports that this has not helped with her glove and stocking lower extremity nerve pain.  On physical exam today, on inspection of her left lateral thigh she did not have any cutaneous lesions, erythema or trauma.  The area was sensitive to touch.  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  Plan: -Increase gabapentin to 600 mg TID

## 2019-09-20 NOTE — Patient Instructions (Signed)
Amber Cross,  Thanks for seeing [place] today. As we discussed, I am increasing your gabapentin. For the Invokana, I believe it is reasonable to continue taking it  As it will help your sugar.   Take care!

## 2019-09-20 NOTE — Progress Notes (Signed)
   CC: Left thigh pain, follow-up insulin-dependent diabetes mellitus  HPI:  [name] is a 59 y.o. with medical problems listed below presenting to follow-up on diabetes mellitus and also for evaluation of left thigh pain.  Please see problem based charting for further details.  Past Medical History:  Diagnosis Date  . Diabetes mellitus   . Hypercholesteremia   . Hypertension    Review of Systems:  As per HPI  Physical Exam:  Vitals:   09/20/19 1530  BP: 130/71  Pulse: (!) 117  Temp: 98.2 F (36.8 C)  TempSrc: Oral  SpO2: 99%  Weight: 105 lb 11.2 oz (47.9 kg)  Height: 5' 7.5" (1.715 m)   Physical Exam  Constitutional: No distress.  Cardiovascular: Normal rate, regular rhythm and normal heart sounds.  Pulmonary/Chest: Effort normal and breath sounds normal. No respiratory distress. She has no wheezes.  Skin: She is not diaphoretic.  Psychiatric: Mood and affect normal.    Assessment & Plan:   See Encounters Tab for problem based charting.  Patient discussed with Dr. Lynnae January

## 2019-09-20 NOTE — Assessment & Plan Note (Signed)
She reports over 2 to 3-week complaint of left lateral thigh pain that is very sensitive to touch.  She describes nerve pain that is located at the anterior aspect of her left thigh.  During her last visit, gabapentin was increased to 600 mg twice daily.  She however reports that this has not helped with her glove and stocking lower extremity nerve pain.  On physical exam today, on inspection of her left lateral thigh she did not have any cutaneous lesions, erythema or trauma.  The area was sensitive to touch.  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  Plan: -Increase gabapentin to 600 mg TID

## 2019-09-24 NOTE — Progress Notes (Signed)
Internal Medicine Clinic Attending  Case discussed with Dr. Agyei at the time of the visit.  We reviewed the resident's history and exam and pertinent patient test results.  I agree with the assessment, diagnosis, and plan of care documented in the resident's note.    

## 2019-10-04 ENCOUNTER — Other Ambulatory Visit: Payer: Self-pay

## 2019-10-04 MED FILL — CONTOUR NEXT STRIPS: 30 days supply | Qty: 100 | Fill #3

## 2019-10-04 MED FILL — MICROLET LANCETS MISC: 30 days supply | Qty: 100 | Fill #3

## 2019-10-04 NOTE — Telephone Encounter (Signed)
Requesting to speak with a nurse about meds, please call pt back.  

## 2019-10-04 NOTE — Telephone Encounter (Signed)
Goes straight to busy signal when called

## 2019-10-12 NOTE — Telephone Encounter (Signed)
Busy signal again when called

## 2019-10-19 ENCOUNTER — Other Ambulatory Visit: Payer: Self-pay

## 2019-10-19 ENCOUNTER — Encounter: Payer: Self-pay | Admitting: Internal Medicine

## 2019-10-19 ENCOUNTER — Ambulatory Visit: Payer: Self-pay

## 2019-10-19 ENCOUNTER — Telehealth: Payer: Self-pay | Admitting: *Deleted

## 2019-10-19 NOTE — Telephone Encounter (Signed)
Both CMA and MD attempted to contact patient multiple times at number provided-rings busy and unable to leave message. Will have front office reschedule appt and mail to pt-pt also missed financial counselor appt today also.Despina Hidden Cassady3/23/20214:16 PM

## 2019-10-20 NOTE — Telephone Encounter (Signed)
Called patient this morning.  Pt's Phone number is busy.  Will try back again.

## 2019-11-08 ENCOUNTER — Other Ambulatory Visit: Payer: Self-pay | Admitting: Internal Medicine

## 2019-11-08 DIAGNOSIS — E1165 Type 2 diabetes mellitus with hyperglycemia: Secondary | ICD-10-CM

## 2019-11-08 MED ORDER — METFORMIN HCL 1000 MG PO TABS: 1000.0000 mg | ORAL_TABLET | Freq: Two times a day (BID) | ORAL | 1 refills | Status: DC

## 2019-11-08 MED ORDER — GABAPENTIN 300 MG PO CAPS: 600.0000 mg | ORAL_CAPSULE | Freq: Three times a day (TID) | ORAL | 3 refills | Status: DC

## 2019-11-08 MED FILL — MICROLET LANCETS MISC: 30 days supply | Qty: 100 | Fill #3

## 2019-11-08 MED FILL — GABAPENTIN 300 MG CAPSULE: 300 | 30 days supply | Qty: 180 | Fill #0

## 2019-11-08 MED FILL — metFORMIN HCL 1000 MG TABS: 1000 | 30 days supply | Qty: 60 | Fill #0

## 2019-11-08 MED FILL — LISINOPRIL 20 MG TABLET: 20 | 30 days supply | Qty: 30 | Fill #3

## 2019-11-08 MED FILL — CONTOUR NEXT STRIPS: 30 days supply | Qty: 100 | Fill #3

## 2019-11-09 MED FILL — LANTUS SOLOSTAR 100 UNITS/M: 100 | 30 days supply | Qty: 3 | Fill #4

## 2019-11-16 ENCOUNTER — Ambulatory Visit (INDEPENDENT_AMBULATORY_CARE_PROVIDER_SITE_OTHER): Payer: Self-pay | Admitting: Internal Medicine

## 2019-11-16 VITALS — BP 160/76 | HR 109 | Temp 97.8°F | Ht 67.0 in | Wt 106.9 lb

## 2019-11-16 DIAGNOSIS — Z720 Tobacco use: Secondary | ICD-10-CM

## 2019-11-16 DIAGNOSIS — Z79899 Other long term (current) drug therapy: Secondary | ICD-10-CM

## 2019-11-16 DIAGNOSIS — E1142 Type 2 diabetes mellitus with diabetic polyneuropathy: Secondary | ICD-10-CM

## 2019-11-16 MED ORDER — GABAPENTIN 300 MG PO CAPS: 900.0000 mg | ORAL_CAPSULE | Freq: Three times a day (TID) | ORAL | 0 refills | Status: DC

## 2019-11-16 MED ORDER — DULOXETINE HCL 30 MG PO CPEP: ORAL_CAPSULE | ORAL | 0 refills | Status: DC

## 2019-11-16 MED FILL — DULoxetine HCL 30 MG CPEP: 30 | 30 days supply | Qty: 53 | Fill #0

## 2019-11-16 NOTE — Assessment & Plan Note (Addendum)
Has been having leg pain since March.  Her gabapentin was increased with no effect.  Feels warm like a burning sensation in her left thigh.  She has also rubbed icy hot on it with no success.  She also tried some muscle relaxers which did not help the issue.  Pain is not worse with ambulation, in fact seems better because she is not paying attention to it.  Has similar pain in bilateral feet in stocking formation.  Considered meralgia paresthetica however she is very thin and does not wear tight fitting pants or belts.  Most consistent with diabetic neuropathy.    -add cymbalta -increase gabapenting to 900mg  as she has no drowsiness with current dose -counseled her that this is a consequence of uncontrolled diabetes, she will follow up for med titration in one week

## 2019-11-16 NOTE — Progress Notes (Signed)
   CC: Diabetic polyneuropathy  HPI:  [name] is a 59 y.o. female with PMH below.  Today we will address Diabetic polyneuropathy  Please see A&P for status of the patient's chronic medical conditions  Past Medical History:  Diagnosis Date  . Diabetes mellitus   . Hypercholesteremia   . Hypertension    Review of Systems:  ROS: Pulmonary: pt denies increased work of breathing, shortness of breath,  Cardiac: pt denies palpitations, chest pain,  Abdominal: pt denies abdominal pain, nausea, vomiting, or diarrhea   Physical Exam:  Vitals:   11/16/19 1529  BP: (!) 160/76  Pulse: (!) 109  Temp: 97.8 F (36.6 C)  TempSrc: Oral  SpO2: 100%  Weight: 106 lb 14.4 oz (48.5 kg)  Height: 5\' 7"  (1.702 m)   Cardiac: JVD flat, normal rate and rhythm, clear s1 and s2, no murmurs, rubs or gallops, no LE edema Pulmonary: CTAB, not in distress MSK: no tenderness to palpation of left thigh.  Normal sensation to light touch of bilateral lower extremities.  No pain with ambulation Psych: Alert, conversant, in good spirits   Social History   Socioeconomic History  . Marital status: Married    Spouse name: Not on file  . Number of children: Not on file  . Years of education: Not on file  . Highest education level: Not on file  Occupational History  . Not on file  Tobacco Use  . Smoking status: Current Some Day Smoker    Packs/day: 0.50    Years: 40.00    Pack years: 20.00    Types: Cigarettes  . Smokeless tobacco: Never Used  Substance and Sexual Activity  . Alcohol use: Yes    Alcohol/week: 1.0 standard drinks    Types: 1 Glasses of wine per week  . Drug use: No  . Sexual activity: Not Currently    Birth control/protection: Abstinence  Other Topics Concern  . Not on file  Social History Narrative  . Not on file   Social Determinants of Health   Financial Resource Strain:   . Difficulty of Paying Living Expenses:   Food Insecurity:   . Worried About Paediatric nurse in the Last Year:   . Arboriculturist in the Last Year:   Transportation Needs:   . Film/video editor (Medical):   Marland Kitchen Lack of Transportation (Non-Medical):   Physical Activity:   . Days of Exercise per Week:   . Minutes of Exercise per Session:   Stress:   . Feeling of Stress :   Social Connections:   . Frequency of Communication with Friends and Family:   . Frequency of Social Gatherings with Friends and Family:   . Attends Religious Services:   . Active Member of Clubs or Organizations:   . Attends Archivist Meetings:   Marland Kitchen Marital Status:   Intimate Partner Violence:   . Fear of Current or Ex-Partner:   . Emotionally Abused:   Marland Kitchen Physically Abused:   . Sexually Abused:     Family History  Problem Relation Age of Onset  . Diabetes Mother   . Hypertension Mother   . Hypertension Father     Assessment & Plan:   See Encounters Tab for problem based charting.  Patient discussed with Dr. Lynnae January

## 2019-11-16 NOTE — Assessment & Plan Note (Signed)
>>  ASSESSMENT AND PLAN FOR DIABETIC NEUROPATHY (HCC)  WRITTEN ON 11/16/2019  4:42 PM BY Angelita Ingles, MD  Has been having leg pain since March.  Her gabapentin was increased with no effect.  Feels warm like a burning sensation in her left thigh.  She has also rubbed icy hot on it with no success.  She also tried some muscle relaxers which did not help the issue.  Pain is not worse with ambulation, in fact seems better because she is not paying attention to it.  Has similar pain in bilateral feet in stocking formation.  Considered meralgia paresthetica however she is very thin and does not wear tight fitting pants or belts.  Most consistent with diabetic neuropathy.    -add cymbalta -increase gabapenting to 900mg  as she has no drowsiness with current dose -counseled her that this is a consequence of uncontrolled diabetes, she will follow up for med titration in one week

## 2019-11-16 NOTE — Patient Instructions (Addendum)
Ms. Adolphus Birchwood we will start you on a medication called cymbalta for your neuropathic pain.  Additionally we have increased your gabapentin to 900mg  three times daily.

## 2019-11-17 ENCOUNTER — Encounter: Payer: Self-pay | Admitting: *Deleted

## 2019-11-17 NOTE — Progress Notes (Signed)
Internal Medicine Clinic Attending  Case discussed with Dr. Winfrey  at the time of the visit.  We reviewed the resident's history and exam and pertinent patient test results.  I agree with the assessment, diagnosis, and plan of care documented in the resident's note.  

## 2019-11-25 ENCOUNTER — Encounter: Payer: Self-pay | Admitting: Internal Medicine

## 2019-12-09 ENCOUNTER — Ambulatory Visit: Payer: Self-pay

## 2019-12-14 MED FILL — LISINOPRIL 20 MG TABLET: 20 | 30 days supply | Qty: 30 | Fill #4

## 2019-12-23 ENCOUNTER — Other Ambulatory Visit: Payer: Self-pay

## 2019-12-23 ENCOUNTER — Encounter: Payer: Self-pay | Admitting: Internal Medicine

## 2019-12-23 ENCOUNTER — Ambulatory Visit (INDEPENDENT_AMBULATORY_CARE_PROVIDER_SITE_OTHER): Payer: Self-pay | Admitting: Internal Medicine

## 2019-12-23 VITALS — BP 139/59 | HR 88 | Temp 98.0°F | Ht 67.5 in | Wt 108.6 lb

## 2019-12-23 DIAGNOSIS — E1142 Type 2 diabetes mellitus with diabetic polyneuropathy: Secondary | ICD-10-CM

## 2019-12-23 DIAGNOSIS — I10 Essential (primary) hypertension: Secondary | ICD-10-CM

## 2019-12-23 DIAGNOSIS — Z794 Long term (current) use of insulin: Secondary | ICD-10-CM

## 2019-12-23 DIAGNOSIS — E1165 Type 2 diabetes mellitus with hyperglycemia: Secondary | ICD-10-CM

## 2019-12-23 DIAGNOSIS — Z7984 Long term (current) use of oral hypoglycemic drugs: Secondary | ICD-10-CM

## 2019-12-23 LAB — GLUCOSE, CAPILLARY: Glucose-Capillary: 177 mg/dL — ABNORMAL HIGH (ref 70–99)

## 2019-12-23 LAB — POCT GLYCOSYLATED HEMOGLOBIN (HGB A1C): Hemoglobin A1C: 11 % — AB (ref 4.0–5.6)

## 2019-12-23 MED ORDER — CANAGLIFLOZIN 100 MG PO TABS: 200.0000 mg | ORAL_TABLET | Freq: Every day | ORAL | 0 refills | Status: DC

## 2019-12-23 MED ORDER — INSULIN GLARGINE 100 UNITS/ML SOLOSTAR PEN: 13.0000 [IU] | PEN_INJECTOR | Freq: Every day | SUBCUTANEOUS | 1 refills | Status: DC

## 2019-12-23 MED FILL — INVOKANA 100 MG TABLET: 100 | 30 days supply | Qty: 60 | Fill #0

## 2019-12-23 MED FILL — metFORMIN HCL 1000 MG TABS: 1000 | 30 days supply | Qty: 60 | Fill #1

## 2019-12-23 NOTE — Progress Notes (Signed)
    CC: follow-up on diabetes and neuropathy  HPI:  [name] is a 59 y.o. F with significant PMH as outlined below, who presents for diabetes follow-up. Please see problem-based charting for additional information.  Past Medical History:  Diagnosis Date  . Diabetes mellitus   . Hypercholesteremia   . Hypertension    Review of Systems:   Review of Systems  Constitutional: Negative for chills and fever.  Respiratory: Negative for cough and shortness of breath.   Cardiovascular: Negative for chest pain and palpitations.  Gastrointestinal: Negative for nausea and vomiting.  Genitourinary: Negative for dysuria.  Musculoskeletal: Negative for back pain and falls.       LLE burning pain  Neurological: Negative for focal weakness and weakness.   Physical Exam:  Vitals:   12/23/19 1547  BP: (!) 139/59  Pulse: 88  Temp: 98 F (36.7 C)  TempSrc: Oral  SpO2: 99%  Weight: 108 lb 9.6 oz (49.3 kg)  Height: 5' 7.5" (1.715 m)   Physical Exam Vitals and nursing note reviewed.  Constitutional:      General: She is not in acute distress.    Appearance: She is underweight.  Pulmonary:     Effort: Pulmonary effort is normal.  Musculoskeletal:        General: No swelling, tenderness, deformity or signs of injury. Normal range of motion.     Comments: Full strength and ROM in LLE.  Skin:    General: Skin is warm and dry.  Neurological:     Mental Status: She is alert.    Assessment & Plan:   See Encounters Tab for problem based charting.  Patient discussed with Dr. Dareen Piano

## 2019-12-23 NOTE — Patient Instructions (Addendum)
Amber Cross,  For your diabetes - increase Invokana to 200mg  daily - increase Lantus to 13 units daily - continue metformin 1,000mg  twice a day  For your hypertension - increase lisinopril to 40mg  daily  For your leg pain (diabetic neuropathy) - increase your duloxetine to 60mg  daily  Follow-up with me in 1-2 weeks. BRING YOU GLUCOMETER AND MEDICATIONS TO THIS APPOINTMENT

## 2019-12-28 MED ORDER — LISINOPRIL 40 MG PO TABS: 40.0000 mg | ORAL_TABLET | Freq: Every day | ORAL | 0 refills | Status: DC

## 2019-12-28 MED ORDER — DULOXETINE HCL 60 MG PO CPEP: 60.0000 mg | ORAL_CAPSULE | Freq: Every day | ORAL | 0 refills | Status: DC

## 2019-12-28 NOTE — Assessment & Plan Note (Addendum)
>>  ASSESSMENT AND PLAN FOR TYPE 2 DIABETES MELLITUS WITH DIABETIC POLYNEUROPATHY (HCC) WRITTEN ON 12/28/2019  5:01 PM BY Thom Chimes, MD  Pt endorses taking 100mg  Invokana daily, lantus 10u daily, and metformin 1,000mg  BID. Checks her CBGs approximately once or twice per day. States morning readings are between 84-120 and others are in the range of 100-200s (and sometimes 300s). She does not have her meter with her today. Denies hypoglycemic episodes, nausea/vomiting, diaphoresis, lightheadedness, confusion, chest pain, palpitations, shortness of breath, or dysuria. She continues to have LLE neuropathic pain. A1c today up to 11, from 9 in Dec 2020. CBG in clinic 177.  Assessment - uncontrolled type II diabetes  - increase invokana to 200mg  daily - continue metformin 1,000mg  BID - increase lantus to 13 units daily - instructed pt to check sugars 4 times a day for 1 week and decrease lantus back to 10 units if AM CBGs are low - pt to bring glucometer to next appointment in 2 weeks - she may need mealtime coverage pending glucometer results however pt is adamant she cannot tolerate the pain of checking her sugars 4 times daily - currently self-pay so limited continuous glucose monitor options - encouraged pt to continue to work with financial assistance   Lab Results  Component Value Date   HGBA1C 11.0 (A) 12/23/2019     >>ASSESSMENT AND PLAN FOR DIABETIC NEUROPATHY (HCC)  WRITTEN ON 12/28/2019  5:11 PM BY 12/25/2019, MD  Pt currently taking gabapentin and Cymbalta for her neuropathic LLE pain. States addition of Cymbalta last month helped her pain somewhat, but it still persists. She describes the pain as burning and radiating down the L thigh. Says she has been taking Cymbalta 30mg  daily and gabapentin 900mg  TID. A1c today remains uncontrolled at 11. Denies any medication side effects, drowsiness, back pain, or nausea/vomiting.   - increase Cymbalta to 60mg  daily - continue gabapentin 900mg   TID - diabetes medications changes as outlined before - advised pt that better diabetes control will also help her leg pain

## 2019-12-28 NOTE — Assessment & Plan Note (Addendum)
Pt currently taking gabapentin and Cymbalta for her neuropathic LLE pain. States addition of Cymbalta last month helped her pain somewhat, but it still persists. She describes the pain as burning and radiating down the L thigh. Says she has been taking Cymbalta 30mg  daily and gabapentin 900mg  TID. A1c today remains uncontrolled at 11. Denies any medication side effects, drowsiness, back pain, or nausea/vomiting.   - increase Cymbalta to 60mg  daily - continue gabapentin 900mg  TID - diabetes medications changes as outlined before - advised pt that better diabetes control will also help her leg pain

## 2019-12-28 NOTE — Assessment & Plan Note (Signed)
BP remains elevated today at 139/59. Endorses taking 20mg  lisinopril daily. Microalbumin/Cr ratio elevated in Dec 2020. Given current diabetes, BP goal is < 130/80. Denies chest pain, palpitations, headache, dizziness, or vision changes.  - increase lisinopril to 40mg  daily - recheck renal function at next visit  BP Readings from Last 3 Encounters:  12/23/19 (!) 139/59  11/16/19 (!) 160/76  09/20/19 130/71

## 2019-12-29 NOTE — Progress Notes (Signed)
Internal Medicine Clinic Attending  Case discussed with Dr. Jones at the time of the visit.  We reviewed the resident's history and exam and pertinent patient test results.  I agree with the assessment, diagnosis, and plan of care documented in the resident's note.  

## 2020-01-07 ENCOUNTER — Other Ambulatory Visit (HOSPITAL_COMMUNITY): Payer: Self-pay | Admitting: Internal Medicine

## 2020-01-18 ENCOUNTER — Ambulatory Visit: Payer: Self-pay

## 2020-01-27 ENCOUNTER — Other Ambulatory Visit (HOSPITAL_COMMUNITY): Payer: Self-pay | Admitting: Internal Medicine

## 2020-01-27 ENCOUNTER — Other Ambulatory Visit: Payer: Self-pay | Admitting: Internal Medicine

## 2020-01-27 DIAGNOSIS — E1142 Type 2 diabetes mellitus with diabetic polyneuropathy: Secondary | ICD-10-CM

## 2020-01-27 DIAGNOSIS — I10 Essential (primary) hypertension: Secondary | ICD-10-CM

## 2020-01-27 MED FILL — DULoxetine HCL 60 MG CPEP: 60 | 30 days supply | Qty: 30 | Fill #0

## 2020-01-27 MED FILL — LISINOPRIL 40 MG TABS: 40 | 30 days supply | Qty: 30 | Fill #0

## 2020-01-27 NOTE — Telephone Encounter (Signed)
New 40mg  dose called [facility]

## 2020-02-22 MED FILL — metFORMIN HCL 1000 MG TABS: 1000 | 30 days supply | Qty: 60 | Fill #2

## 2020-02-22 MED FILL — GABAPENTIN 300 MG CAPSULE: 300 | 30 days supply | Qty: 180 | Fill #2

## 2020-02-28 MED FILL — LISINOPRIL 40 MG TABS: 40 | 30 days supply | Qty: 30 | Fill #1

## 2020-02-28 MED FILL — DULoxetine HCL 60 MG CPEP: 60 | 30 days supply | Qty: 30 | Fill #1

## 2020-05-01 MED FILL — GABAPENTIN 300 MG CAPSULE: 300 | 30 days supply | Qty: 180 | Fill #3

## 2020-05-01 MED FILL — METFORMIN HCL 1000 MG TABS: 1000 | 30 days supply | Qty: 60 | Fill #3

## 2020-05-01 MED FILL — LISINOPRIL 40 MG TABS: 40 | 30 days supply | Qty: 30 | Fill #2

## 2020-05-01 MED FILL — DULoxetine HCL 60 MG CPEP: 60 | 30 days supply | Qty: 30 | Fill #2

## 2020-05-04 ENCOUNTER — Other Ambulatory Visit: Payer: Self-pay | Admitting: Internal Medicine

## 2020-05-04 DIAGNOSIS — E1142 Type 2 diabetes mellitus with diabetic polyneuropathy: Secondary | ICD-10-CM

## 2020-05-08 ENCOUNTER — Other Ambulatory Visit: Payer: Self-pay | Admitting: Internal Medicine

## 2020-05-08 ENCOUNTER — Other Ambulatory Visit: Payer: Self-pay

## 2020-05-08 ENCOUNTER — Ambulatory Visit: Payer: Self-pay | Admitting: Internal Medicine

## 2020-05-08 ENCOUNTER — Encounter: Payer: Self-pay | Admitting: Internal Medicine

## 2020-05-08 VITALS — BP 148/66 | HR 92 | Temp 98.2°F | Ht 67.5 in | Wt 112.4 lb

## 2020-05-08 DIAGNOSIS — E1142 Type 2 diabetes mellitus with diabetic polyneuropathy: Secondary | ICD-10-CM

## 2020-05-08 DIAGNOSIS — N952 Postmenopausal atrophic vaginitis: Secondary | ICD-10-CM | POA: Insufficient documentation

## 2020-05-08 DIAGNOSIS — I10 Essential (primary) hypertension: Secondary | ICD-10-CM

## 2020-05-08 DIAGNOSIS — Z720 Tobacco use: Secondary | ICD-10-CM

## 2020-05-08 LAB — GLUCOSE, CAPILLARY: Glucose-Capillary: 461 mg/dL — ABNORMAL HIGH (ref 70–99)

## 2020-05-08 LAB — POCT GLYCOSYLATED HEMOGLOBIN (HGB A1C): Hemoglobin A1C: 13.7 % — AB (ref 4.0–5.6)

## 2020-05-08 MED ORDER — INSULIN DETEMIR 100 UNIT/ML FLEXPEN: 13.0000 [IU] | PEN_INJECTOR | Freq: Every day | SUBCUTANEOUS | 3 refills | Status: DC

## 2020-05-08 MED ORDER — DULOXETINE HCL 60 MG PO CPEP: 60.0000 mg | ORAL_CAPSULE | Freq: Every day | ORAL | 3 refills | Status: DC

## 2020-05-08 MED ORDER — LISINOPRIL 40 MG PO TABS: 40.0000 mg | ORAL_TABLET | Freq: Every day | ORAL | 3 refills | Status: DC

## 2020-05-08 MED ORDER — CANAGLIFLOZIN 100 MG PO TABS: 200.0000 mg | ORAL_TABLET | Freq: Every day | ORAL | 3 refills | Status: DC

## 2020-05-08 MED ORDER — METFORMIN HCL 1000 MG PO TABS: 1000.0000 mg | ORAL_TABLET | Freq: Two times a day (BID) | ORAL | 3 refills | Status: DC

## 2020-05-08 MED ORDER — GABAPENTIN 300 MG PO CAPS: 900.0000 mg | ORAL_CAPSULE | Freq: Three times a day (TID) | ORAL | 0 refills | Status: DC

## 2020-05-08 MED ORDER — INSULIN PEN NEEDLE 32G X 4 MM MISC: 3 refills | Status: DC

## 2020-05-08 MED ORDER — INSULIN ASPART 100 UNIT/ML FLEXPEN: 5.0000 [IU] | PEN_INJECTOR | Freq: Three times a day (TID) | SUBCUTANEOUS | 3 refills | Status: DC

## 2020-05-08 MED FILL — LEVEMIR FLEXTOUCH 100 UNITS: 100 | 23 days supply | Qty: 3 | Fill #0

## 2020-05-08 MED FILL — UNIFINE PENTIPS 32GX5/32: 32G X 4 MM | 30 days supply | Qty: 100 | Fill #0

## 2020-05-08 MED FILL — INVOKANA 100 MG TABLET: 100 | 30 days supply | Qty: 60 | Fill #0

## 2020-05-08 MED FILL — NOVOLOG FLEXPEN SYRINGE: 100 | 20 days supply | Qty: 3 | Fill #0

## 2020-05-08 NOTE — Assessment & Plan Note (Signed)
BP Readings from Last 3 Encounters:  05/08/20 (!) 148/66  12/23/19 (!) 139/59  11/16/19 (!) 160/76   Above goal. Has run out of meds. Currently denies any cough, rash, light-headedness or dizziness.  - C/w lisinopril 40mg  daily ( refill sent) - BMP at next visit

## 2020-05-08 NOTE — Assessment & Plan Note (Addendum)
>>  ASSESSMENT AND PLAN FOR TYPE 2 DIABETES MELLITUS WITH DIABETIC POLYNEUROPATHY (HCC) WRITTEN ON 05/08/2020  5:11 PM BY Everlena Mackley, Artist Pais, MD  Lab Results  Component Value Date   HGBA1C 13.7 (A) 05/08/2020   Amber Cross is a 59 yo F w/ PMH of uncontrolled diabetes presenting for f/u for her diabetes. She did not bring her glucometer but mentions seeing 'good numbers' with blood glucose ranging in 200s or less. However, she does state that she has been having poor dietary adherence, citing fast food meals such as [employer]. She also has not been able to take her insulin for the last week due to running out. She mentions that she had some polyuria but denies any polydipsia or polyphagia. She denies any hypoglycemic episodes while taking her Lantus 13 units qhs. Denies any nausea, vomiting, diarrhea, constipation.  A/P Worsening glycemic control from hgb a1c of 11 to 13.7. Treatment complicated by lack of insurance. Also mentions having difficulty picking up refills on time as she needs to get it from [facility] pharmacy for uninsured discount program and she lives in [place]. 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 Will be able to expand her treatment options once insured. Would benefit from starting mealtime coverage to meet insulin need - Start novolog 5 units TID qc - C/w invokana 200mg  daily - C/w metformin 1000mg  BID - C/w Lantus 13 units daily - Advised to bring glucometer at next appointment  >>ASSESSMENT AND PLAN FOR DIABETIC NEUROPATHY (HCC)  WRITTEN ON 05/08/2020  5:05 PM BY Sheryl Towell K, MD  Pt requires refills on medications with associated diagnosis above.  Reviewed disease process and find this medication to be necessary, will not change dose or alter current therapy.

## 2020-05-08 NOTE — Progress Notes (Signed)
CC: Vaginal itching  HPI: [name] is a 59 y.o. with PMH listed below presenting with complaint of vaginal itching. Please see problem based assessment and plan for further details.  Past Medical History:  Diagnosis Date  . Diabetes mellitus   . Hypercholesteremia   . Hypertension     Review of Systems: Review of Systems  Constitutional: Negative for chills, fever and malaise/fatigue.  Eyes: Negative for blurred vision.  Respiratory: Negative for shortness of breath.   Cardiovascular: Negative for chest pain, palpitations and leg swelling.  Gastrointestinal: Negative for constipation, diarrhea, nausea and vomiting.  Genitourinary: Negative for dysuria, frequency and urgency.  Neurological: Negative for dizziness, sensory change, weakness and headaches.  All other systems reviewed and are negative.    Physical Exam: Vitals:   05/08/20 1603  BP: (!) 148/66  Pulse: 92  Temp: 98.2 F (36.8 C)  TempSrc: Oral  SpO2: 95%  Weight: 112 lb 6.4 oz (51 kg)  Height: 5' 7.5" (1.715 m)    Gen: Well-developed, well nourished, NAD HEENT: NCAT head, hearing intact CV: RRR, S1, S2 normal Pulm: CTAB, No rales, no wheezes GU: Atropic appearing external labia with dry folds, no vaginal discharge, no rashes Extm: ROM intact, Peripheral pulses intact, No peripheral edema Skin: Dry, Warm, normal turgor, no wounds on feet bilaterally  Assessment & Plan:   Tobacco use Continues to smoke 1/2 ppd. Discussed importance of cessation  Atrophic vaginitis [name] is a 59 yo F w/ PMH of HTN, uncontrolled diabetes w/ neuropathy presenting to [facility] with complaint of vaginal itching. She mentions this is chronic and has been ongoing for months. She mentions that she has not noticed any vaginal itching, discharge, fevers, chills, nausea, vomiting. Denies any dysuria or urgency. She did endorse polyuria but mentions this was associated with initiation of canagliflozin. She denies  dyspareunia or any new onset rashes or warts.  A/P Exam shows dry, atrophic appearing vagina consistent with atrophic vaginitis. Treatment complicated by lack of insurance as estrogen creams are quite expensive. Advised on good hygiene and using emollients to alleviate symptoms.  - Advised on topical OTC emollient use, such as vaseline - F/u once insured  Diabetic neuropathy ([place])  Pt requires refills on medications with associated diagnosis above.  Reviewed disease process and find this medication to be necessary, will not change dose or alter current therapy.   Essential hypertension BP Readings from Last 3 Encounters:  05/08/20 (!) 148/66  12/23/19 (!) 139/59  11/16/19 (!) 160/76   Above goal. Has run out of meds. Currently denies any cough, rash, light-headedness or dizziness.  - C/w lisinopril 40mg  daily ( refill sent) - BMP at next visit  Type 2 diabetes mellitus with peripheral neuropathy [facility]) Lab Results  Component Value Date   HGBA1C 13.7 (A) 05/08/2020   [name] is a 59 yo F w/ PMH of uncontrolled diabetes presenting for f/u for her diabetes. She did not bring her glucometer but mentions seeing 'good numbers' with blood glucose ranging in 200s or less. However, she does state that she has been having poor dietary adherence, citing fast food meals such as [employer]. She also has not been able to take her insulin for the last week due to running out. She mentions that she had some polyuria but denies any polydipsia or polyphagia. She denies any hypoglycemic episodes while taking her Lantus 13 units qhs. Denies any nausea, vomiting, diarrhea, constipation.  A/P Worsening glycemic control from hgb a1c of 11 to 13.7. Treatment complicated  by lack of insurance. Also mentions having difficulty picking up refills on time as she needs to get it from [facility] for uninsured discount program and she lives in [place]. 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 Will be able to expand her treatment options once insured. Would benefit from starting mealtime coverage to meet insulin need - Start novolog 5 units TID qc - C/w invokana 200mg  daily - C/w metformin 1000mg  BID - C/w Lantus 13 units daily - Advised to bring glucometer at next appointment    Patient discussed with Dr. Evette Doffing  -Gilberto Better, [facility] Pager: [phone]

## 2020-05-08 NOTE — Assessment & Plan Note (Signed)
Pt requires refills on medications with associated diagnosis above.  Reviewed disease process and find this medication to be necessary, will not change dose or alter current therapy. 

## 2020-05-08 NOTE — Assessment & Plan Note (Signed)
Continues to smoke 1/2 ppd. Discussed importance of cessation

## 2020-05-08 NOTE — Telephone Encounter (Signed)
Pt has an appt today with Dr Truman Hayward.

## 2020-05-08 NOTE — Assessment & Plan Note (Signed)
Amber Cross is a 59 yo F w/ PMH of HTN, uncontrolled diabetes w/ neuropathy presenting to [facility] with complaint of vaginal itching. She mentions this is chronic and has been ongoing for months. She mentions that she has not noticed any vaginal itching, discharge, fevers, chills, nausea, vomiting. Denies any dysuria or urgency. She did endorse polyuria but mentions this was associated with initiation of canagliflozin. She denies dyspareunia or any new onset rashes or warts.  A/P Exam shows dry, atrophic appearing vagina consistent with atrophic vaginitis. Treatment complicated by lack of insurance as estrogen creams are quite expensive. Advised on good hygiene and using emollients to alleviate symptoms.  - Advised on topical OTC emollient use, such as vaseline - F/u once insured

## 2020-05-08 NOTE — Patient Instructions (Addendum)
Thank you for allowing [place] to provide your care today. Today we discussed your vaginal itching and diabetes    I have ordered no hgb a1c for you. I will call if any are abnormal.    Today we made the following changes to your medications.    Please start novolog 5 units with meals  Please follow-up in 1 month.    Should you have any questions or concerns please call the internal medicine clinic at [phone].     Atrophic Vaginitis Atrophic vaginitis is a condition in which the tissues that line the vagina become dry and thin. This condition occurs in women who have stopped having their period. It is caused by a drop in a female hormone (estrogen). This hormone helps:  To keep the vagina moist.  To make a clear fluid. This clear fluid helps: ? To make the vagina ready for sex. ? To protect the vagina from infection. If the lining of the vagina is dry and thin, it may cause irritation, burning, or itchiness. It may also:  Make sex painful.  Make an exam of your vagina painful.  Cause bleeding.  Make you lose interest in sex.  Cause a burning feeling when you pee (urinate).  Cause a brown or yellow fluid to come from your vagina. Some women do not have symptoms. Follow these instructions at home: Medicines  Take over-the-counter and prescription medicines only as told by your doctor.  Do not use herbs or other medicines unless your doctor says it is okay.  Use medicines for for dryness. These include: ? Oils to make the vagina soft. ? Creams. ? Moisturizers. General instructions  Do not douche.  Do not use products that can make your vagina dry. These include: ? Scented sprays. ? Scented tampons. ? Scented soaps.  Sex can help increase blood flow and soften the tissue in the vagina. If it hurts to have sex: ? Tell your partner. ? Use products to make sex more comfortable. Use these only as told by your doctor. Contact a doctor if you:  Have discharge from  the vagina that is different than usual.  Have a bad smell coming from your vagina.  Have new symptoms.  Do not get better.  Get worse. Summary  Atrophic vaginitis is a condition in which the lining of the vagina becomes dry and thin.  This condition affects women who have stopped having their periods.  Treatment may include using products that help make the vagina soft.  Call a doctor if do not get better with treatment. This information is not intended to replace advice given to you by your health care provider. Make sure you discuss any questions you have with your health care provider. Document Revised: 07/28/2017 Document Reviewed: 07/28/2017 Elsevier Patient Education  2020 [employer].

## 2020-05-09 NOTE — Progress Notes (Signed)
Internal Medicine Clinic Attending  Case discussed with Dr. Lee  At the time of the visit.  We reviewed the resident's history and exam and pertinent patient test results.  I agree with the assessment, diagnosis, and plan of care documented in the resident's note.    

## 2020-06-21 MED FILL — LEVEMIR FLEXTOUCH 100 UNITS: 100 | 23 days supply | Qty: 3 | Fill #1

## 2020-07-14 ENCOUNTER — Other Ambulatory Visit: Payer: Self-pay | Admitting: *Deleted

## 2020-07-14 DIAGNOSIS — I10 Essential (primary) hypertension: Secondary | ICD-10-CM

## 2020-07-14 DIAGNOSIS — E1142 Type 2 diabetes mellitus with diabetic polyneuropathy: Secondary | ICD-10-CM

## 2020-07-14 NOTE — Telephone Encounter (Signed)
Requesting refill on Lisinopril,Insulin and Duloxetine called to [facility] on [address] in [place]. She is home with COVID.

## 2020-07-17 MED ORDER — DULOXETINE HCL 60 MG PO CPEP: 60.0000 mg | ORAL_CAPSULE | Freq: Every day | ORAL | 0 refills | Status: DC

## 2020-07-17 MED ORDER — INSULIN DETEMIR 100 UNIT/ML FLEXPEN: 13.0000 [IU] | PEN_INJECTOR | Freq: Every day | SUBCUTANEOUS | 0 refills | Status: DC

## 2020-07-17 MED ORDER — LISINOPRIL 40 MG PO TABS: 40.0000 mg | ORAL_TABLET | Freq: Every day | ORAL | 0 refills | Status: DC

## 2020-07-18 ENCOUNTER — Telehealth: Payer: Self-pay

## 2020-07-18 MED FILL — LISINOPRIL 40 MG TABS: 40 | 30 days supply | Qty: 30 | Fill #0

## 2020-07-18 MED FILL — NOVOLOG FLEXPEN SYRINGE: 100 | 20 days supply | Qty: 3 | Fill #1

## 2020-07-18 MED FILL — DULoxetine HCL 60 MG CPEP: 60 | 30 days supply | Qty: 30 | Fill #0

## 2020-07-18 MED FILL — LEVEMIR FLEXTOUCH 100 UNITS: 100 | 23 days supply | Qty: 3 | Fill #1

## 2020-07-18 MED FILL — UNIFINE PENTIPS 32GX5/32: 32G X 4 MM | 25 days supply | Qty: 100 | Fill #1

## 2020-07-18 NOTE — Telephone Encounter (Signed)
Pt needs to see [place]. Asap, called [facility] and she will have someone pick up

## 2020-07-18 NOTE — Telephone Encounter (Signed)
Pt would like medicine to be send to  Preferred [place], [place] it was sent to pt can't afford it    insulin aspart (NOVOLOG) 100 UNIT/ML FlexPen  insulin detemir (LEVEMIR) 100 UNIT/ML FlexPen  Insulin Pen Needle 32G X 4 MM MISC   lisinopril (ZESTRIL) 40 MG tablet

## 2020-08-23 MED FILL — GABAPENTIN 300 MG CAPSULE: 300 | 30 days supply | Qty: 180 | Fill #4

## 2020-08-23 MED FILL — LISINOPRIL 40 MG TABS: 40 | 30 days supply | Qty: 30 | Fill #1

## 2020-08-23 MED FILL — DULoxetine HCL 60 MG CPEP: 60 | 30 days supply | Qty: 30 | Fill #1

## 2020-08-23 MED FILL — METFORMIN HCL 1000 MG TABS: 1000 | 30 days supply | Qty: 60 | Fill #4

## 2020-08-23 MED FILL — LEVEMIR FLEXTOUCH 100 UNITS: 100 | 23 days supply | Qty: 3 | Fill #2

## 2020-10-24 ENCOUNTER — Other Ambulatory Visit: Payer: Self-pay

## 2020-10-24 ENCOUNTER — Emergency Department (HOSPITAL_COMMUNITY)
Admission: EM | Admit: 2020-10-24 | Discharge: 2020-10-24 | Disposition: A | Discharge status: Home / Self Care | Payer: Self-pay | Attending: Emergency Medicine | Admitting: Emergency Medicine

## 2020-10-24 ENCOUNTER — Ambulatory Visit (INDEPENDENT_AMBULATORY_CARE_PROVIDER_SITE_OTHER): Payer: Self-pay | Admitting: Student

## 2020-10-24 ENCOUNTER — Encounter (HOSPITAL_COMMUNITY): Payer: Self-pay

## 2020-10-24 ENCOUNTER — Telehealth: Payer: Self-pay | Admitting: *Deleted

## 2020-10-24 ENCOUNTER — Encounter: Payer: Self-pay | Admitting: Student

## 2020-10-24 VITALS — BP 119/66 | HR 103 | Temp 98.2°F | Ht 67.0 in | Wt 108.8 lb

## 2020-10-24 DIAGNOSIS — I10 Essential (primary) hypertension: Secondary | ICD-10-CM | POA: Insufficient documentation

## 2020-10-24 DIAGNOSIS — E1165 Type 2 diabetes mellitus with hyperglycemia: Secondary | ICD-10-CM | POA: Insufficient documentation

## 2020-10-24 DIAGNOSIS — Z79899 Other long term (current) drug therapy: Secondary | ICD-10-CM | POA: Insufficient documentation

## 2020-10-24 DIAGNOSIS — E876 Hypokalemia: Secondary | ICD-10-CM | POA: Insufficient documentation

## 2020-10-24 DIAGNOSIS — F17210 Nicotine dependence, cigarettes, uncomplicated: Secondary | ICD-10-CM | POA: Insufficient documentation

## 2020-10-24 DIAGNOSIS — Z7984 Long term (current) use of oral hypoglycemic drugs: Secondary | ICD-10-CM | POA: Insufficient documentation

## 2020-10-24 DIAGNOSIS — Z794 Long term (current) use of insulin: Secondary | ICD-10-CM | POA: Insufficient documentation

## 2020-10-24 DIAGNOSIS — E1142 Type 2 diabetes mellitus with diabetic polyneuropathy: Secondary | ICD-10-CM

## 2020-10-24 DIAGNOSIS — E1140 Type 2 diabetes mellitus with diabetic neuropathy, unspecified: Secondary | ICD-10-CM | POA: Insufficient documentation

## 2020-10-24 LAB — POCT GLYCOSYLATED HEMOGLOBIN (HGB A1C): HbA1c POC (<> result, manual entry): >14 % — AB (ref 4.0–5.6)

## 2020-10-24 LAB — CBC WITH DIFFERENTIAL/PLATELET
Abs Immature Granulocytes: 0.02 10*3/uL (ref 0.00–0.07)
Basophils Absolute: 0 10*3/uL (ref 0.0–0.1)
Basophils Relative: 0 %
Eosinophils Absolute: 0 10*3/uL (ref 0.0–0.5)
Eosinophils Relative: 0 %
HCT: 36.6 % (ref 36.0–46.0)
Hemoglobin: 12.3 g/dL (ref 12.0–15.0)
Immature Granulocytes: 0 %
Lymphocytes Relative: 34 %
Lymphs Abs: 3.1 10*3/uL (ref 0.7–4.0)
MCH: 28.6 pg (ref 26.0–34.0)
MCHC: 33.6 g/dL (ref 30.0–36.0)
MCV: 85.1 fL (ref 80.0–100.0)
Monocytes Absolute: 0.5 10*3/uL (ref 0.1–1.0)
Monocytes Relative: 6 %
Neutro Abs: 5.5 10*3/uL (ref 1.7–7.7)
Neutrophils Relative %: 60 %
Platelets: 337 10*3/uL (ref 150–400)
RBC: 4.3 MIL/uL (ref 3.87–5.11)
RDW: 13.4 % (ref 11.5–15.5)
WBC: 9.1 10*3/uL (ref 4.0–10.5)
nRBC: 0 % (ref 0.0–0.2)

## 2020-10-24 LAB — BASIC METABOLIC PANEL
Anion gap: 9 (ref 5–15)
BUN: 19 mg/dL (ref 6–20)
CO2: 30 mmol/L (ref 22–32)
Calcium: 8.4 mg/dL — ABNORMAL LOW (ref 8.9–10.3)
Chloride: 92 mmol/L — ABNORMAL LOW (ref 98–111)
Creatinine, Ser: 1.12 mg/dL — ABNORMAL HIGH (ref 0.44–1.00)
GFR, Estimated: 57 mL/min — ABNORMAL LOW (ref >60)
Glucose, Bld: 318 mg/dL — ABNORMAL HIGH (ref 70–99)
Potassium: 3 mmol/L — ABNORMAL LOW (ref 3.5–5.1)
Sodium: 131 mmol/L — ABNORMAL LOW (ref 135–145)

## 2020-10-24 LAB — GLUCOSE, CAPILLARY: Glucose-Capillary: 354 mg/dL — ABNORMAL HIGH (ref 70–99)

## 2020-10-24 LAB — CBG MONITORING, ED
Glucose-Capillary: 327 mg/dL — ABNORMAL HIGH (ref 70–99)
Glucose-Capillary: 231 mg/dL — ABNORMAL HIGH (ref 70–99)

## 2020-10-24 MED ORDER — POTASSIUM CHLORIDE CRYS ER 20 MEQ PO TBCR
40.0000 meq | EXTENDED_RELEASE_TABLET | Freq: Once | ORAL | Status: AC
  Administered 2020-10-24: 40 meq via ORAL
  Filled 2020-10-24: qty 2

## 2020-10-24 MED ORDER — POTASSIUM CHLORIDE ER 20 MEQ PO TBCR: 20.0000 meq | EXTENDED_RELEASE_TABLET | Freq: Every day | ORAL | 0 refills | Status: DC

## 2020-10-24 MED ORDER — LACTATED RINGERS IV BOLUS
1000.0000 mL | Freq: Once | INTRAVENOUS | Status: AC
  Administered 2020-10-24: 1000 mL via INTRAVENOUS

## 2020-10-24 NOTE — ED Provider Notes (Signed)
[facility] EMERGENCY DEPARTMENT Provider Note   CSN: [id number] Arrival date & time: 10/24/20  1646     History Chief Complaint  Patient presents with  . Hyperglycemia    Amber Cross is a 60 y.o. female past medical history of type 2 diabetes, hypertension, hyperlipidemia, presenting for evaluation of hyperglycemia.  Patient admits she does not regularly check her blood sugar because it hurts her finger.  She states when she has checked it it has been over 200 or 300.  Today when she went to check it it was reading is over 500.  She called PCP who instructed her to come to the ED for evaluation.  She also admits to not taking her diabetes medications regularly.  She states she does take her long-acting insulin at bedtime, however does not usually take her short acting insulin because it makes her feel sick.  She also only intermittently takes her Metformin because it causes her to have diarrhea.  Endorses associated polyuria and polydipsia.  No fevers or other symptoms of illness.  The history is provided by the patient.       Past Medical History:  Diagnosis Date  . Diabetes mellitus   . Hypercholesteremia   . Hypertension     Patient Active Problem List   Diagnosis Date Noted  . Atrophic vaginitis 05/08/2020  . Diabetic neuropathy ([place])  07/20/2019  . Tobacco use 07/20/2019  . Weight loss 07/20/2019  . Essential hypertension 05/03/2019  . Type 2 diabetes mellitus with peripheral neuropathy ([place]) 04/06/2019    Past Surgical History:  Procedure Laterality Date  . ANTERIOR CRUCIATE LIGAMENT REPAIR Right   . CATARACT EXTRACTION    . OVARY SURGERY    . RIGHT OOPHORECTOMY Right   . SMALL INTESTINE SURGERY    . TUBAL LIGATION       OB History   No obstetric history on file.     Family History  Problem Relation Age of Onset  . Diabetes Mother   . Hypertension Mother   . Hypertension Father     Social History   Tobacco Use  . Smoking  status: Current Some Day Smoker    Packs/day: 0.50    Years: 40.00    Pack years: 20.00    Types: Cigarettes  . Smokeless tobacco: Never Used  Substance Use Topics  . Alcohol use: Not Currently    Alcohol/week: 1.0 standard drink    Types: 1 Glasses of wine per week  . Drug use: No    Home Medications Prior to Admission medications   Medication Sig Start Date End Date Taking? Authorizing Designer, industrial/product Lancets lancets Check blood sugar up to 3 times a day as instructed 04/06/19   Katherine Roan, MD  canagliflozin [facility]) 100 MG TABS tablet Take 2 tablets (200 mg total) by mouth daily before breakfast. 05/08/20   Mosetta Anis, MD  DULoxetine (CYMBALTA) 60 MG capsule Take 1 capsule (60 mg total) by mouth daily. 07/17/20 10/15/20  Jean Rosenthal, MD  gabapentin (NEURONTIN) 300 MG capsule Take 3 capsules (900 mg total) by mouth 3 (three) times daily. 05/08/20 06/07/20  Mosetta Anis, MD  glucose blood test strip Check blood sugar up to 3 times a day as instructed 04/06/19   Katherine Roan, MD  insulin aspart (NOVOLOG) 100 UNIT/ML FlexPen Inject 5 Units into the skin 3 (three) times daily with meals. 05/08/20   Mosetta Anis, MD  insulin detemir (LEVEMIR) 100  UNIT/ML FlexPen Inject 13 Units into the skin daily. 07/17/20   Jean Rosenthal, MD  Insulin Pen Needle 32G X 4 MM MISC Use to inject insulin daily 05/08/20   Mosetta Anis, MD  lisinopril (ZESTRIL) 40 MG tablet Take 1 tablet (40 mg total) by mouth daily. 07/17/20 10/15/20  Jean Rosenthal, MD  metFORMIN (GLUCOPHAGE) 1000 MG tablet Take 1 tablet (1,000 mg total) by mouth 2 (two) times daily with a meal. 05/08/20 11/04/20  Mosetta Anis, MD  potassium chloride 20 MEQ TBCR Take 20 mEq by mouth daily for 2 days. 10/24/20 10/26/20 Yes Leanthony Rhett, [place] N, PA-C    Allergies    Penicillins  Review of Systems   Review of Systems  Endocrine: Positive for polydipsia and polyuria.  All other systems reviewed and are  negative.   Physical Exam Updated Vital Signs BP (!) 163/86   Pulse 86   Temp 98.1 F (36.7 C) (Oral)   Resp 20   Ht 5\' 7"  (1.702 m)   Wt 49 kg   LMP 09/29/2011   SpO2 98%   BMI 16.92 kg/m   Physical Exam Vitals and nursing note reviewed.  Constitutional:      General: She is not in acute distress.    Appearance: She is well-developed. She is not ill-appearing.  HENT:     Head: Normocephalic and atraumatic.  Eyes:     Conjunctiva/sclera: Conjunctivae normal.  Cardiovascular:     Rate and Rhythm: Normal rate and regular rhythm.  Pulmonary:     Effort: Pulmonary effort is normal. No respiratory distress.     Breath sounds: Normal breath sounds.  Abdominal:     General: Bowel sounds are normal.     Palpations: Abdomen is soft.     Tenderness: There is no abdominal tenderness.  Skin:    General: Skin is warm.  Neurological:     Mental Status: She is alert.  Psychiatric:        Behavior: Behavior normal.     ED Results / Procedures / Treatments   Labs (all labs ordered are listed, but only abnormal results are displayed) Labs Reviewed  BASIC METABOLIC PANEL - Abnormal; Notable for the following components:      Result Value   Sodium 131 (*)    Potassium 3.0 (*)    Chloride 92 (*)    Glucose, Bld 318 (*)    Creatinine, Ser 1.12 (*)    Calcium 8.4 (*)    GFR, Estimated 57 (*)    All other components within normal limits  CBG MONITORING, ED - Abnormal; Notable for the following components:   Glucose-Capillary 327 (*)    All other components within normal limits  CBG MONITORING, ED - Abnormal; Notable for the following components:   Glucose-Capillary 231 (*)    All other components within normal limits  CBC WITH DIFFERENTIAL/PLATELET    EKG None  Radiology No results found.  Procedures Procedures   Medications Ordered in ED Medications  potassium chloride SA (KLOR-CON) CR tablet 40 mEq (has no administration in time range)  lactated ringers bolus  1,000 mL (1,000 mLs Intravenous New Bag/Given 10/24/20 1856)    ED Course  I have reviewed the triage vital signs and the nursing notes.  Pertinent labs & imaging results that were available during my care of the patient were reviewed by me and considered in my medical decision making (see chart for details).    MDM Rules/Calculators/A&P  Patient presenting for evaluation of hyperglycemia.  She is insulin dependent type II diabetic, not compliant with her medications, only takes her long-acting Lantus at bedtime regularly, however does not taking her short acting insulin or Metformin regularly.  Also does not regularly check her blood sugar but when she did today it was high and PCP sent her for evaluation.  Labs today show corrected sodium of 136, blood glucose levels in the low 300s. Mild hypokalemia of 3, replaced orally, will give a couple days replacement, will need PCP to recheck. Mild AKI with Cr 1.1. Patient hydrated with 1L bolus. She is appropriate for discharge to home.  Discussed with patient need for close PCP follow-up to discuss her medication management of her diabetes.  Patient verbalized understanding.  Discussed results, findings, treatment and follow up. Patient advised of return precautions. Patient verbalized understanding and agreed with plan.  Final Clinical Impression(s) / ED Diagnoses Final diagnoses:  Type 2 diabetes mellitus with hyperglycemia, with long-term current use of insulin ([place])  Hypokalemia    Rx / DC Orders ED Discharge Orders         Ordered    potassium chloride 20 MEQ TBCR  Daily        10/24/20 2027           Kelcy Laible, [place] N, PA-C 10/24/20 2027    Truddie Hidden, MD 10/24/20 [phone]

## 2020-10-24 NOTE — Discharge Instructions (Addendum)
It is important you take your medications as prescribed, or discuss your intolerances with your PCP for any medication adjustments. Your potassium is slightly low today, please take the replacement for the next 2 days and have your PCP recheck your level. It is important you drink plenty of water to stay hydrated.

## 2020-10-24 NOTE — ED Triage Notes (Signed)
Patient to ED for complaints of hyperglycemia. States today at 1300 her BG was over 500 at home, took 5 units of  Insulin and went to see PCP. At PCP office, patient states that she was told to come to ED for further evaluation.

## 2020-10-24 NOTE — ED Notes (Signed)
Patient given discharge paperwork and instructions. Verbalized understanding of teaching. IV d/c with cath tip intact. Ambulatory to exit in NAD with steady gait.

## 2020-10-24 NOTE — Progress Notes (Signed)
Ms. Amber Cross is a 60yo with uncontrolled diabetes (A1c >14.0% today) presenting with worsening po intake, nausea, and vomiting. Patient is normotensive and mildly tachycardic with CBG 350 here in the clinic. Plan to send patient to ED for further evaluation and DKA work-up.

## 2020-10-24 NOTE — Telephone Encounter (Cosign Needed)
Call from pt stating she does not feel well; hx of diabetes. States she's nauseated, has diarrhea after she eats, and off balance.I ask if she's taking her diabetes medications - states she's taking metformin and levemir but not invokana and novolog. States she told the doctor before novolog makes her sick. I ask how often she checks her BS; states once. And the last time was 2 days ago which was 323 she thinks then she said probably higher, states sometimes it reads [facility]. Pt states she able to come in today; appt schedule with Dr Collene Gobble @ 1515 PM (only available appt) inform if she starts to feel worse to go to the ED - voiced understanding.

## 2020-10-25 ENCOUNTER — Encounter: Payer: Self-pay | Admitting: Student

## 2020-10-25 NOTE — Progress Notes (Signed)
Internal Medicine Clinic Attending  Case discussed with Dr. Collene Gobble  At the time of the visit.  We reviewed the resident's history and exam and pertinent patient test results.  I agree with the assessment, diagnosis, and plan of care documented in the resident's note.   Patient presented for DM follow up, found to be hyperglycemic with nausea and inability to tolerate PO. Work up was started for DKA however she was unable to provide a urine sample due to dehydration and she could not tolerate water intake due to nausea. She was sent to the ED for further management.

## 2020-10-28 ENCOUNTER — Other Ambulatory Visit (HOSPITAL_COMMUNITY): Payer: Self-pay

## 2020-11-09 ENCOUNTER — Ambulatory Visit: Payer: Self-pay

## 2020-12-15 ENCOUNTER — Other Ambulatory Visit (HOSPITAL_COMMUNITY): Payer: Self-pay

## 2020-12-15 ENCOUNTER — Other Ambulatory Visit: Payer: Self-pay | Admitting: Internal Medicine

## 2020-12-15 MED FILL — Lisinopril Tab 40 MG: ORAL | 30 days supply | Qty: 30 | Fill #0 | Status: AC

## 2020-12-15 MED FILL — Insulin Detemir Soln Pen-injector 100 Unit/ML: SUBCUTANEOUS | 23 days supply | Qty: 3 | Fill #0 | Status: AC

## 2020-12-18 ENCOUNTER — Telehealth: Payer: Self-pay | Admitting: *Deleted

## 2020-12-18 ENCOUNTER — Encounter: Payer: Self-pay | Admitting: Internal Medicine

## 2020-12-18 NOTE — Telephone Encounter (Signed)
CALLED AND SPOKE WITH PATIENT. SHE IS TO CALL AND RESCHEDULE DUE TO BAD WEATHER.

## 2020-12-18 NOTE — Telephone Encounter (Signed)
Per chart review patient is on 900 mg TID.

## 2020-12-19 ENCOUNTER — Other Ambulatory Visit (HOSPITAL_COMMUNITY): Payer: Self-pay

## 2021-01-25 ENCOUNTER — Other Ambulatory Visit (HOSPITAL_COMMUNITY): Payer: Self-pay

## 2021-02-05 ENCOUNTER — Other Ambulatory Visit (HOSPITAL_COMMUNITY): Payer: Self-pay

## 2021-02-05 ENCOUNTER — Other Ambulatory Visit: Payer: Self-pay | Admitting: Internal Medicine

## 2021-02-05 ENCOUNTER — Other Ambulatory Visit: Payer: Self-pay | Admitting: Student

## 2021-02-05 DIAGNOSIS — R600 Localized edema: Secondary | ICD-10-CM | POA: Diagnosis present

## 2021-02-05 DIAGNOSIS — E1142 Type 2 diabetes mellitus with diabetic polyneuropathy: Secondary | ICD-10-CM

## 2021-02-05 MED FILL — Insulin Pen Needle 32 G X 4 MM (1/6" or 5/32"): 100 days supply | Qty: 100 | Fill #0 | Status: CN

## 2021-02-05 MED FILL — Lisinopril Tab 40 MG: ORAL | 30 days supply | Qty: 30 | Fill #1 | Status: CN

## 2021-02-06 ENCOUNTER — Other Ambulatory Visit: Payer: Self-pay | Admitting: Student

## 2021-02-06 ENCOUNTER — Other Ambulatory Visit (HOSPITAL_COMMUNITY): Payer: Self-pay

## 2021-02-06 ENCOUNTER — Other Ambulatory Visit: Payer: Self-pay | Admitting: Internal Medicine

## 2021-02-06 DIAGNOSIS — E1142 Type 2 diabetes mellitus with diabetic polyneuropathy: Secondary | ICD-10-CM

## 2021-02-06 MED FILL — Insulin Detemir Soln Pen-injector 100 Unit/ML: SUBCUTANEOUS | 23 days supply | Qty: 3 | Fill #1 | Status: CN

## 2021-02-06 MED FILL — Insulin Aspart Soln Pen-injector 100 Unit/ML: SUBCUTANEOUS | 20 days supply | Qty: 3 | Fill #0 | Status: CN

## 2021-02-07 ENCOUNTER — Other Ambulatory Visit (HOSPITAL_COMMUNITY): Payer: Self-pay

## 2021-02-07 NOTE — Telephone Encounter (Signed)
Called pt - mailbox fulled, unable to leave a message. Called home # - "no longer in service".

## 2021-02-08 ENCOUNTER — Other Ambulatory Visit (HOSPITAL_COMMUNITY): Payer: Self-pay

## 2021-02-09 ENCOUNTER — Other Ambulatory Visit (HOSPITAL_COMMUNITY): Payer: Self-pay

## 2021-02-13 ENCOUNTER — Other Ambulatory Visit (HOSPITAL_COMMUNITY): Payer: Self-pay

## 2021-04-16 DIAGNOSIS — E785 Hyperlipidemia, unspecified: Secondary | ICD-10-CM | POA: Diagnosis present

## 2021-04-16 DIAGNOSIS — E8809 Other disorders of plasma-protein metabolism, not elsewhere classified: Secondary | ICD-10-CM | POA: Insufficient documentation

## 2021-05-07 ENCOUNTER — Inpatient Hospital Stay (HOSPITAL_BASED_OUTPATIENT_CLINIC_OR_DEPARTMENT_OTHER)
Admission: EM | Admit: 2021-05-07 | Discharge: 2021-05-10 | DRG: 291 | Disposition: A | Discharge status: Home / Self Care | Payer: BLUE CROSS/BLUE SHIELD | Attending: Internal Medicine | Admitting: Internal Medicine

## 2021-05-07 ENCOUNTER — Encounter (HOSPITAL_BASED_OUTPATIENT_CLINIC_OR_DEPARTMENT_OTHER): Payer: Self-pay

## 2021-05-07 ENCOUNTER — Other Ambulatory Visit: Payer: Self-pay

## 2021-05-07 ENCOUNTER — Emergency Department (HOSPITAL_BASED_OUTPATIENT_CLINIC_OR_DEPARTMENT_OTHER): Payer: BLUE CROSS/BLUE SHIELD

## 2021-05-07 DIAGNOSIS — I248 Other forms of acute ischemic heart disease: Secondary | ICD-10-CM | POA: Diagnosis present

## 2021-05-07 DIAGNOSIS — I160 Hypertensive urgency: Secondary | ICD-10-CM | POA: Diagnosis not present

## 2021-05-07 DIAGNOSIS — Z90721 Acquired absence of ovaries, unilateral: Secondary | ICD-10-CM | POA: Diagnosis not present

## 2021-05-07 DIAGNOSIS — I5023 Acute on chronic systolic (congestive) heart failure: Secondary | ICD-10-CM | POA: Diagnosis not present

## 2021-05-07 DIAGNOSIS — E7800 Pure hypercholesterolemia, unspecified: Secondary | ICD-10-CM | POA: Diagnosis not present

## 2021-05-07 DIAGNOSIS — I340 Nonrheumatic mitral (valve) insufficiency: Secondary | ICD-10-CM | POA: Diagnosis present

## 2021-05-07 DIAGNOSIS — E11649 Type 2 diabetes mellitus with hypoglycemia without coma: Secondary | ICD-10-CM | POA: Diagnosis not present

## 2021-05-07 DIAGNOSIS — E876 Hypokalemia: Secondary | ICD-10-CM | POA: Diagnosis present

## 2021-05-07 DIAGNOSIS — I110 Hypertensive heart disease with heart failure: Secondary | ICD-10-CM | POA: Diagnosis not present

## 2021-05-07 DIAGNOSIS — I428 Other cardiomyopathies: Secondary | ICD-10-CM | POA: Diagnosis not present

## 2021-05-07 DIAGNOSIS — Z79899 Other long term (current) drug therapy: Secondary | ICD-10-CM | POA: Diagnosis not present

## 2021-05-07 DIAGNOSIS — D509 Iron deficiency anemia, unspecified: Secondary | ICD-10-CM | POA: Diagnosis not present

## 2021-05-07 DIAGNOSIS — I509 Heart failure, unspecified: Secondary | ICD-10-CM | POA: Diagnosis not present

## 2021-05-07 DIAGNOSIS — F17210 Nicotine dependence, cigarettes, uncomplicated: Secondary | ICD-10-CM | POA: Diagnosis not present

## 2021-05-07 DIAGNOSIS — I10 Essential (primary) hypertension: Secondary | ICD-10-CM | POA: Diagnosis present

## 2021-05-07 DIAGNOSIS — R636 Underweight: Secondary | ICD-10-CM | POA: Diagnosis present

## 2021-05-07 DIAGNOSIS — D638 Anemia in other chronic diseases classified elsewhere: Secondary | ICD-10-CM | POA: Diagnosis not present

## 2021-05-07 DIAGNOSIS — E1142 Type 2 diabetes mellitus with diabetic polyneuropathy: Secondary | ICD-10-CM | POA: Diagnosis present

## 2021-05-07 DIAGNOSIS — E785 Hyperlipidemia, unspecified: Secondary | ICD-10-CM | POA: Diagnosis present

## 2021-05-07 DIAGNOSIS — Z681 Body mass index (BMI) 19 or less, adult: Secondary | ICD-10-CM

## 2021-05-07 DIAGNOSIS — I3139 Other pericardial effusion (noninflammatory): Secondary | ICD-10-CM | POA: Diagnosis present

## 2021-05-07 DIAGNOSIS — Z794 Long term (current) use of insulin: Secondary | ICD-10-CM | POA: Diagnosis not present

## 2021-05-07 DIAGNOSIS — E859 Amyloidosis, unspecified: Secondary | ICD-10-CM | POA: Diagnosis not present

## 2021-05-07 DIAGNOSIS — R0789 Other chest pain: Secondary | ICD-10-CM | POA: Diagnosis not present

## 2021-05-07 DIAGNOSIS — R600 Localized edema: Secondary | ICD-10-CM | POA: Diagnosis not present

## 2021-05-07 DIAGNOSIS — C9110 Chronic lymphocytic leukemia of B-cell type not having achieved remission: Secondary | ICD-10-CM | POA: Diagnosis present

## 2021-05-07 DIAGNOSIS — Z8249 Family history of ischemic heart disease and other diseases of the circulatory system: Secondary | ICD-10-CM

## 2021-05-07 DIAGNOSIS — E1122 Type 2 diabetes mellitus with diabetic chronic kidney disease: Secondary | ICD-10-CM | POA: Diagnosis present

## 2021-05-07 DIAGNOSIS — Z7984 Long term (current) use of oral hypoglycemic drugs: Secondary | ICD-10-CM

## 2021-05-07 DIAGNOSIS — Z716 Tobacco abuse counseling: Secondary | ICD-10-CM

## 2021-05-07 DIAGNOSIS — Z20822 Contact with and (suspected) exposure to covid-19: Secondary | ICD-10-CM | POA: Diagnosis not present

## 2021-05-07 DIAGNOSIS — I5020 Unspecified systolic (congestive) heart failure: Secondary | ICD-10-CM

## 2021-05-07 DIAGNOSIS — R079 Chest pain, unspecified: Secondary | ICD-10-CM | POA: Diagnosis present

## 2021-05-07 DIAGNOSIS — Z880 Allergy status to penicillin: Secondary | ICD-10-CM

## 2021-05-07 DIAGNOSIS — Z91018 Allergy to other foods: Secondary | ICD-10-CM

## 2021-05-07 HISTORY — DX: Chest pain, unspecified: R07.9

## 2021-05-07 HISTORY — DX: Tobacco use: Z72.0

## 2021-05-07 LAB — BASIC METABOLIC PANEL
Anion gap: 6 (ref 5–15)
BUN: 17 mg/dL (ref 6–20)
CO2: 27 mmol/L (ref 22–32)
Calcium: 8.1 mg/dL — ABNORMAL LOW (ref 8.9–10.3)
Chloride: 103 mmol/L (ref 98–111)
Creatinine, Ser: 1.16 mg/dL — ABNORMAL HIGH (ref 0.44–1.00)
GFR, Estimated: 54 mL/min — ABNORMAL LOW (ref >60)
Glucose, Bld: 451 mg/dL — ABNORMAL HIGH (ref 70–99)
Potassium: 3.4 mmol/L — ABNORMAL LOW (ref 3.5–5.1)
Sodium: 136 mmol/L (ref 135–145)

## 2021-05-07 LAB — CBC
HCT: 33.2 % — ABNORMAL LOW (ref 36.0–46.0)
Hemoglobin: 10.7 g/dL — ABNORMAL LOW (ref 12.0–15.0)
MCH: 28.2 pg (ref 26.0–34.0)
MCHC: 32.2 g/dL (ref 30.0–36.0)
MCV: 87.6 fL (ref 80.0–100.0)
Platelets: 323 10*3/uL (ref 150–400)
RBC: 3.79 MIL/uL — ABNORMAL LOW (ref 3.87–5.11)
RDW: 14.3 % (ref 11.5–15.5)
WBC: 8.9 10*3/uL (ref 4.0–10.5)
nRBC: 0 % (ref 0.0–0.2)

## 2021-05-07 LAB — CBG MONITORING, ED: Glucose-Capillary: 285 mg/dL — ABNORMAL HIGH (ref 70–99)

## 2021-05-07 LAB — BRAIN NATRIURETIC PEPTIDE: B Natriuretic Peptide: 648.5 pg/mL — ABNORMAL HIGH (ref 0.0–100.0)

## 2021-05-07 LAB — RESP PANEL BY RT-PCR (FLU A&B, COVID) ARPGX2
Influenza A by PCR: NEGATIVE
Influenza B by PCR: NEGATIVE
SARS Coronavirus 2 by RT PCR: NEGATIVE

## 2021-05-07 LAB — TROPONIN I (HIGH SENSITIVITY)
Troponin I (High Sensitivity): 24 ng/L — ABNORMAL HIGH (ref <18)
Troponin I (High Sensitivity): 23 ng/L — ABNORMAL HIGH (ref <18)

## 2021-05-07 IMAGING — DX DG CHEST 2V
2 series · 2 of 2 positions shown · non-contrast
Comparison: None.

CLINICAL DATA: Chest pain.

EXAM:
CHEST - 2 VIEW

[chest pa]
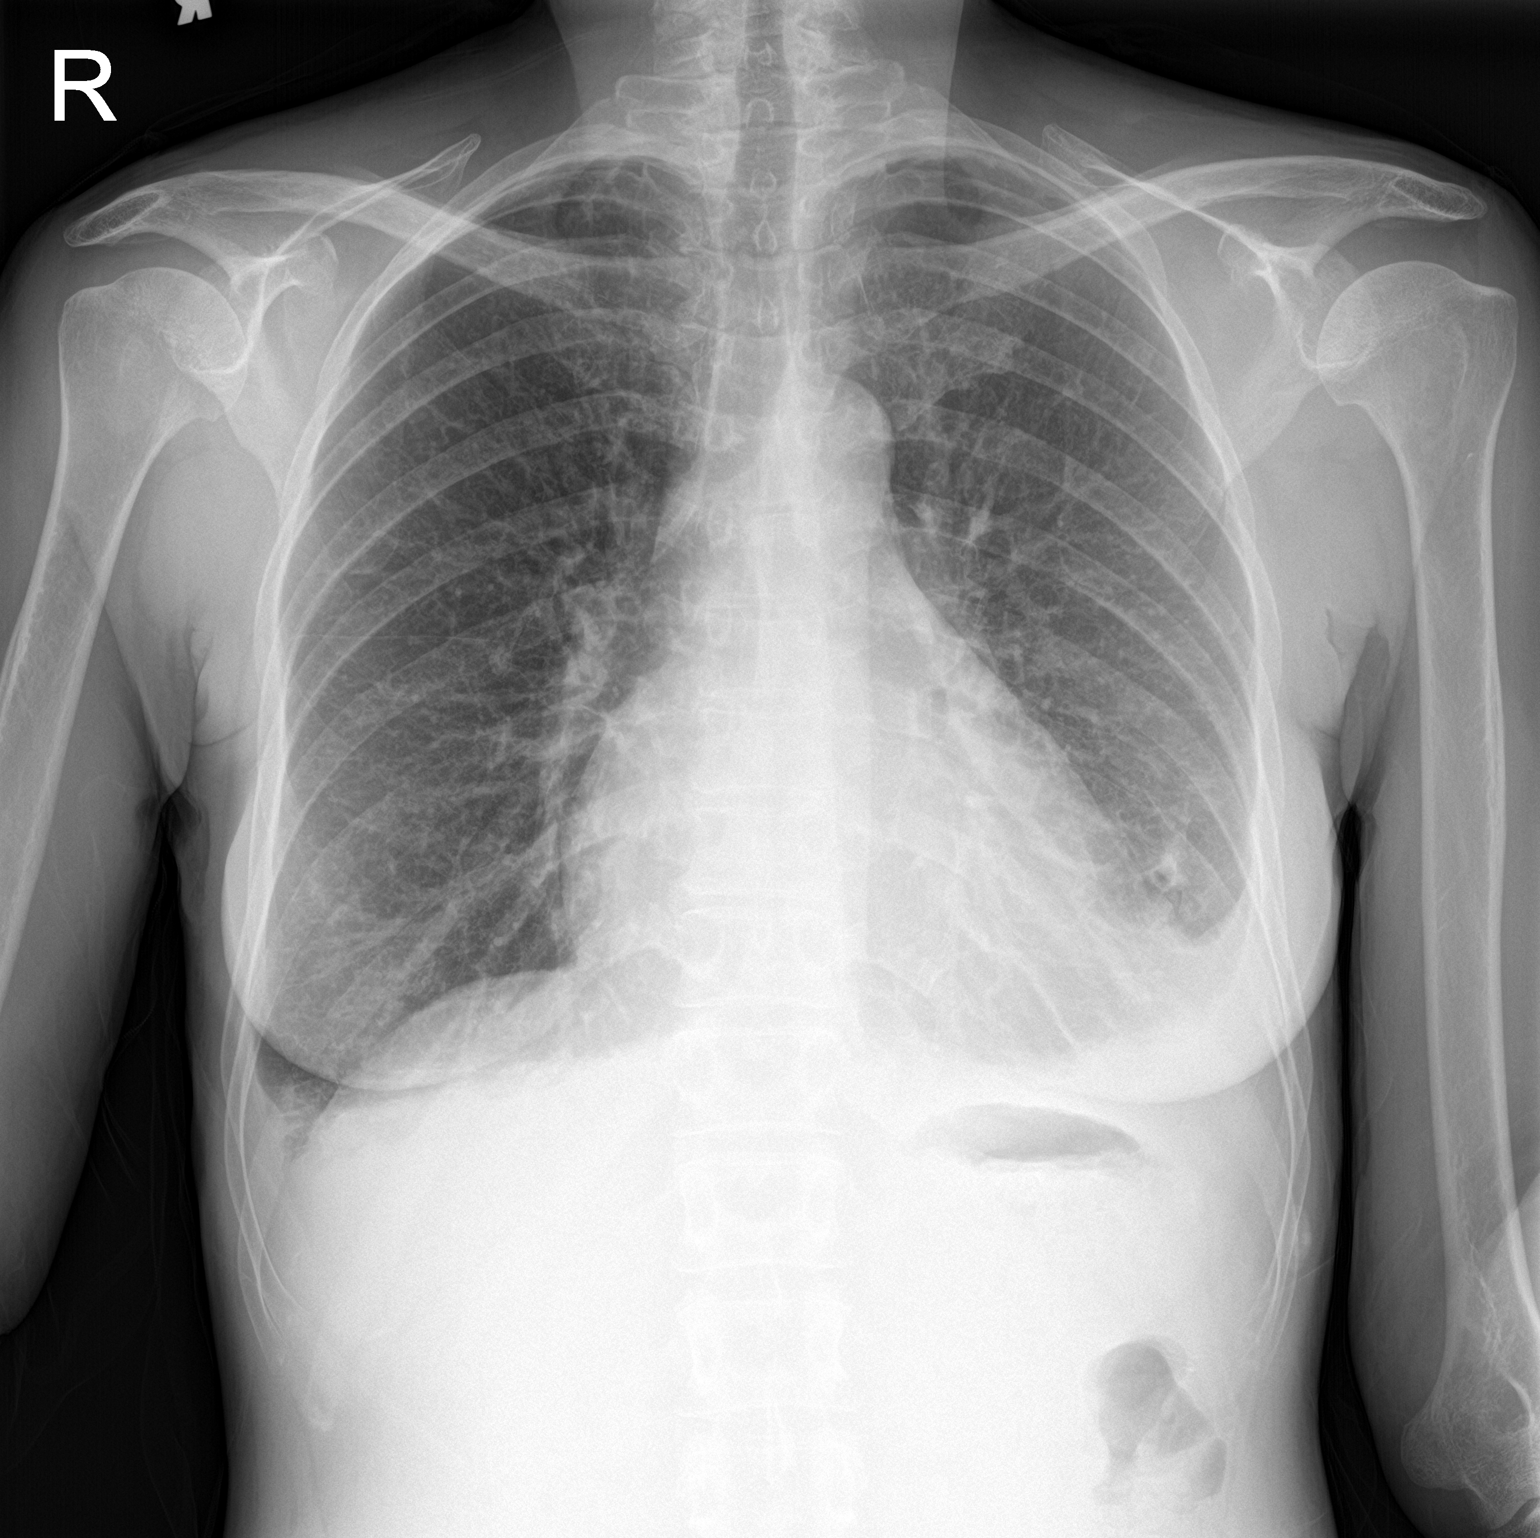

[chest lat]
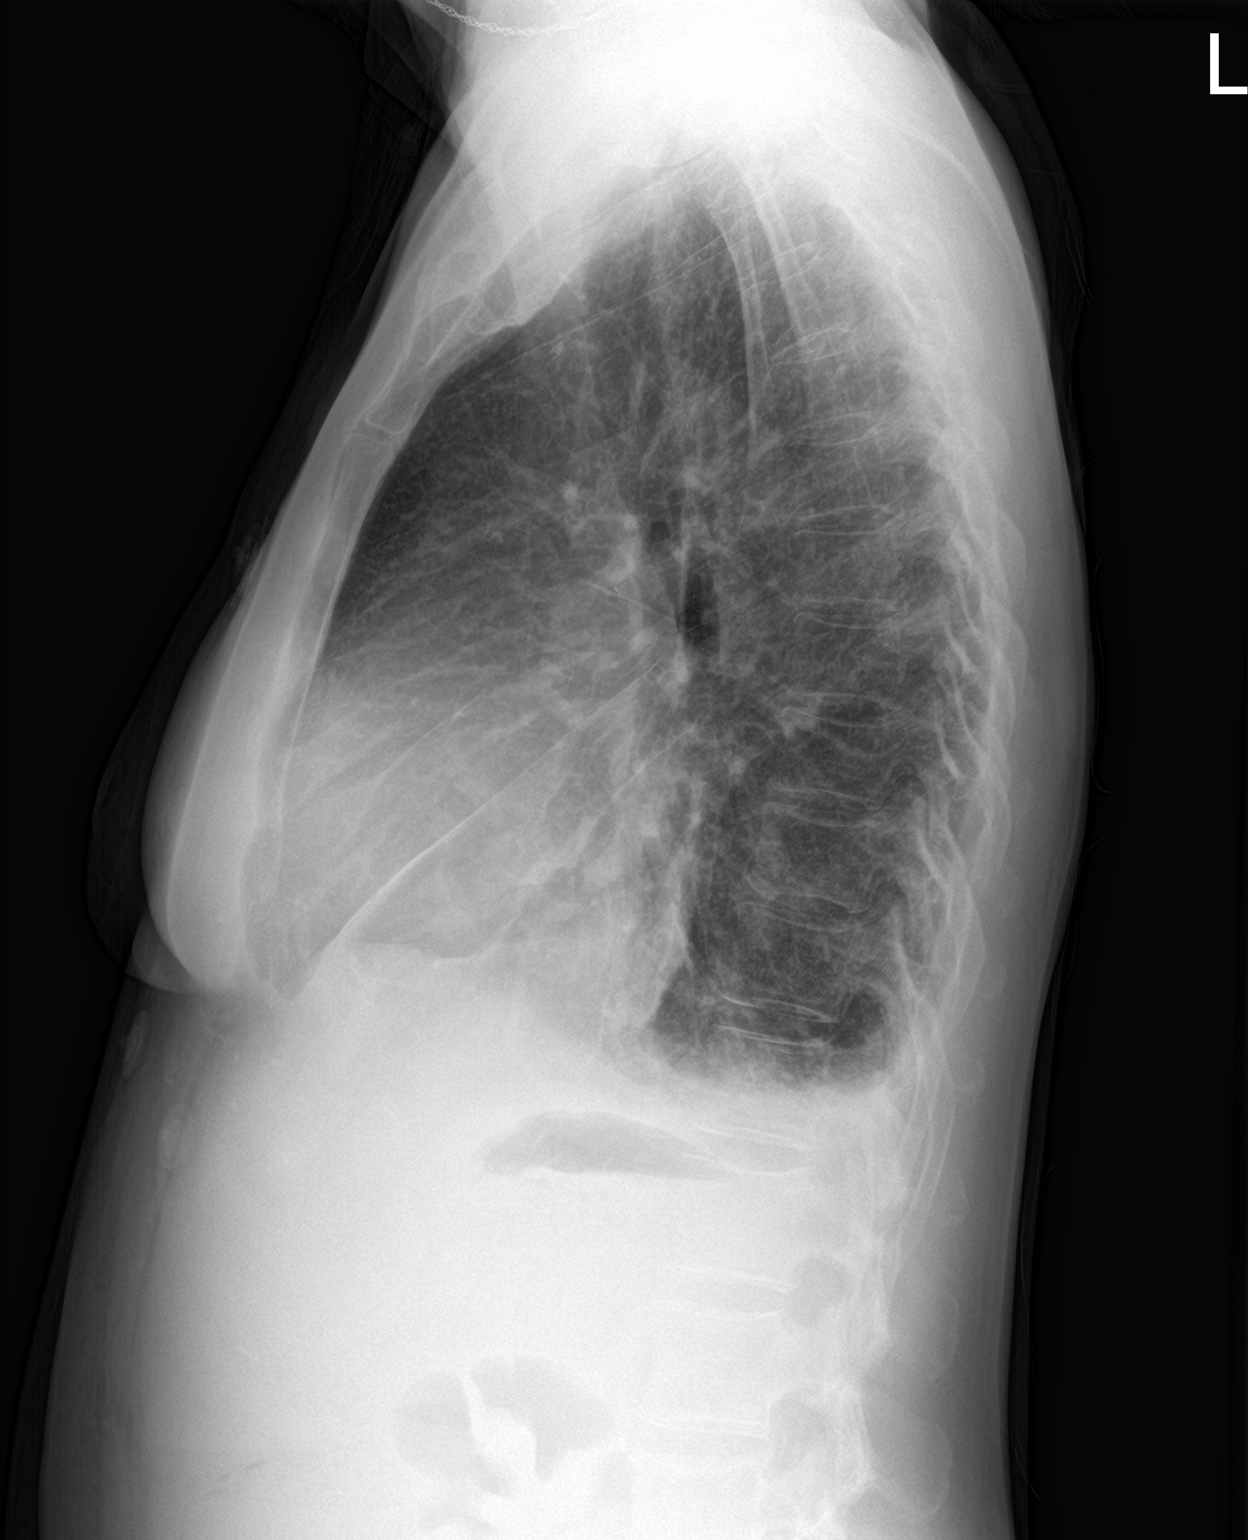

[2 of 2 positions shown; findings below may reference images not displayed]

FINDINGS: Mild atelectasis and/or infiltrate is seen within the left lung
base. Diffuse, chronic appearing increased lung markings are also
seen. There is a small left pleural effusion. No pneumothorax is
identified. The heart size and mediastinal contours are within
normal limits. The visualized skeletal structures are unremarkable.
IMPRESSION: 1. Mild left basilar atelectasis and/or infiltrate.
2. Small left pleural effusion.

## 2021-05-07 MED ORDER — FUROSEMIDE 10 MG/ML IJ SOLN: 40.0000 mg | Freq: Two times a day (BID) | INTRAMUSCULAR | Status: DC

## 2021-05-07 MED ORDER — POLYETHYLENE GLYCOL 3350 17 G PO PACK: 17.0000 g | PACK | Freq: Every day | ORAL | Status: DC | PRN

## 2021-05-07 MED ORDER — CLONIDINE HCL 0.1 MG PO TABS
0.2000 mg | ORAL_TABLET | Freq: Once | ORAL | Status: AC
  Administered 2021-05-07: 0.2 mg via ORAL
  Filled 2021-05-07: qty 2

## 2021-05-07 MED ORDER — ACETAMINOPHEN 325 MG PO TABS
650.0000 mg | ORAL_TABLET | Freq: Four times a day (QID) | ORAL | Status: DC | PRN
  Administered 2021-05-08: 650 mg via ORAL
  Filled 2021-05-07: qty 2

## 2021-05-07 MED ORDER — METOPROLOL TARTRATE 25 MG PO TABS
50.0000 mg | ORAL_TABLET | Freq: Once | ORAL | Status: AC
  Administered 2021-05-07: 50 mg via ORAL
  Filled 2021-05-07: qty 2

## 2021-05-07 MED ORDER — NITROGLYCERIN 0.4 MG SL SUBL: 0.4000 mg | SUBLINGUAL_TABLET | SUBLINGUAL | Status: DC | PRN

## 2021-05-07 MED ORDER — ACETAMINOPHEN 650 MG RE SUPP: 650.0000 mg | Freq: Four times a day (QID) | RECTAL | Status: DC | PRN

## 2021-05-07 MED ORDER — INSULIN ASPART 100 UNIT/ML IJ SOLN
15.0000 [IU] | Freq: Once | INTRAMUSCULAR | Status: AC
  Administered 2021-05-07: 15 [IU] via SUBCUTANEOUS

## 2021-05-07 MED ORDER — ENOXAPARIN SODIUM 40 MG/0.4ML IJ SOSY
40.0000 mg | PREFILLED_SYRINGE | INTRAMUSCULAR | Status: DC
  Administered 2021-05-08 – 2021-05-10 (×3): 40 mg via SUBCUTANEOUS
  Filled 2021-05-07 (×3): qty 0.4

## 2021-05-07 MED ORDER — LISINOPRIL 40 MG PO TABS
40.0000 mg | ORAL_TABLET | Freq: Every day | ORAL | Status: DC
  Administered 2021-05-08: 40 mg via ORAL
  Filled 2021-05-07: qty 1

## 2021-05-07 MED ORDER — ASPIRIN 81 MG PO CHEW
324.0000 mg | CHEWABLE_TABLET | Freq: Once | ORAL | Status: AC
  Administered 2021-05-07: 324 mg via ORAL
  Filled 2021-05-07: qty 4

## 2021-05-07 MED ORDER — SODIUM CHLORIDE 0.9% FLUSH
3.0000 mL | Freq: Two times a day (BID) | INTRAVENOUS | Status: DC
  Administered 2021-05-08 – 2021-05-10 (×6): 3 mL via INTRAVENOUS

## 2021-05-07 MED ORDER — FUROSEMIDE 10 MG/ML IJ SOLN
40.0000 mg | Freq: Once | INTRAMUSCULAR | Status: AC
  Administered 2021-05-07: 40 mg via INTRAVENOUS
  Filled 2021-05-07: qty 4

## 2021-05-07 MED ORDER — HYDRALAZINE HCL 25 MG PO TABS
25.0000 mg | ORAL_TABLET | Freq: Once | ORAL | Status: AC
  Administered 2021-05-07: 25 mg via ORAL
  Filled 2021-05-07: qty 1

## 2021-05-07 NOTE — ED Notes (Signed)
Pt NAD, a/ox4. Pt states she has been having 6/10 dull chest pain to the substernal/left chest pain x several days. +SOB, nausea, left arm pain. LS dim on left, clear right.

## 2021-05-07 NOTE — H&P (Signed)
History and Physical   Amber Cross EKC:[id number] DOB: July 16, 1961 DOA: 05/07/2021  PCP: Jenel Lucks, PA-C  Patient coming from: Home  Chief Complaint: Chest pain shortness of breath, edema  HPI: Amber Cross is a 60 y.o. female with medical history significant of diabetes, hypertension, tobacco use, hyperlipidemia presenting with ongoing chest pain shortness of breath and lower extremity edema.  Patient reports about 1 week of above symptoms that included chest pain, shortness of breath, bilateral lower extremity edema.  Also reports associated cough during that time.  She describes her chest pain as aching and pressure sensation has been waxing and waning for the last 3 days.  Further questioning and chart review reveals patient has actually had ongoing lower extremity edema for about 2 months.  She saw cardiology for this through [facility] around a month ago they noted she had a nuclear stress test with an EF of 43% but no echo and an echo was ordered but she has not had this done yet.  I did increase her Lasix from 20 mg to 40 mg daily p.o. she states she never had significant increase in urination from this dose.  She also states that because her leg swelling was not improving even though she was not having increased urination her PCP stopped her Lasix around 2 weeks ago.  She reports orthopnea.  She denies fevers, chills, abdominal pain, constipation, diarrhea, nausea, vomiting.  ED Course: Vital signs in the ED significant for initial blood pressure in the 505W 979Y systolic which improved to 130s with multiple medications in the ED.  Respiratory rate in the teens to 20s.  Lab work-up showed BMP with potassium 3.4, creatinine 1.16 which is stable for her, glucose elevated to 451.  CBC with hemoglobin of 10.7 appears to be down from baseline around 12.  Troponin flat at 24 and then 23 on repeat.  BNP elevated to 648.  Respiratory panel for flu and COVID-negative.  Chest  x-ray showed mild left basilar atelectasis plus minus infiltrate with small left pleural effusion.  Patient received aspirin, Lasix, clonidine, hydralazine, metoprolol, nitro and insulin in the ED.  Cardiology was consulted due to EKG changes and did not believe it was a STEMI and will see the patient as consult now that patient has arrived.  Review of Systems: As per HPI otherwise all other systems reviewed and are negative.  Past Medical History:  Diagnosis Date   Diabetes mellitus    Hypercholesteremia    Hypertension     Past Surgical History:  Procedure Laterality Date   ANTERIOR CRUCIATE LIGAMENT REPAIR Right    CATARACT EXTRACTION     OVARY SURGERY     RIGHT OOPHORECTOMY Right    SMALL INTESTINE SURGERY     TUBAL LIGATION      Social History  reports that she has been smoking cigarettes. She has a 20.00 pack-year smoking history. She has never used smokeless tobacco. She reports that she does not currently use alcohol after a past usage of about 1.0 standard drink per week. She reports that she does not use drugs.  Allergies  Allergen Reactions   Penicillins     itch   Strawberry (Diagnostic)    Tomato     Family History  Problem Relation Age of Onset   Diabetes Mother    Hypertension Mother    Hypertension Father   Reviewed on admission  Prior to Admission medications   Medication Sig Start Date End Date Taking? Authorizing Careers information officer  [place] Lancets lancets Check blood sugar up to 3 times a day as instructed 04/06/19   Katherine Roan, MD  canagliflozin (INVOKANA) 100 MG TABS tablet TAKE 2 TABLETS (200 MG TOTAL) BY MOUTH DAILY BEFORE BREAKFAST. 05/08/20 05/08/21  Mosetta Anis, MD  DULoxetine (CYMBALTA) 60 MG capsule Take 1 capsule (60 mg total) by mouth daily. 07/17/20 10/15/20  Jean Rosenthal, MD  DULoxetine (CYMBALTA) 60 MG capsule TAKE 1 CAPSULE BY MOUTH ONCE DAILY 01/07/20 01/06/21  Ladona Horns, MD  gabapentin (NEURONTIN) 300 MG capsule Take 3 capsules  (900 mg total) by mouth 3 (three) times daily. 05/08/20 06/07/20  Mosetta Anis, MD  glucose blood test strip Check blood sugar up to 3 times a day as instructed 04/06/19   Katherine Roan, MD  insulin aspart (NOVOLOG) 100 UNIT/ML FlexPen INJECT 5 UNITS INTO THE SKIN 3 (THREE) TIMES DAILY WITH MEALS. 05/08/20 05/08/21  Mosetta Anis, MD  insulin detemir (LEVEMIR) 100 UNIT/ML FlexPen Inject 13 Units into the skin daily. 07/17/20   Agyei, Caprice Kluver, MD  insulin detemir (LEVEMIR) 100 UNIT/ML FlexPen Inject 13 Units into the skin daily. 05/08/20   Mosetta Anis, MD  Insulin Pen Needle 32G X 4 MM MISC USE TO INJECT INSULIN DAILY 05/08/20 05/17/21  Mosetta Anis, MD  lisinopril (ZESTRIL) 40 MG tablet Take 1 tablet (40 mg total) by mouth daily. 07/17/20 10/15/20  Jean Rosenthal, MD  lisinopril (ZESTRIL) 40 MG tablet Take 1 tablet (40 mg total) by mouth daily. 05/08/20   Mosetta Anis, MD  lisinopril (ZESTRIL) 40 MG tablet TAKE 1 TABLET BY MOUTH ONCE DAILY 01/27/20 01/26/21  Ladona Horns, MD  metFORMIN (GLUCOPHAGE) 1000 MG tablet Take 1 tablet (1,000 mg total) by mouth 2 (two) times daily with a meal. 05/08/20 11/04/20  Mosetta Anis, MD  metFORMIN (GLUCOPHAGE) 1000 MG tablet TAKE 1 TABLET (1,000 MG TOTAL) BY MOUTH 2 (TWO) TIMES DAILY WITH A MEAL. 11/08/19 11/07/20  Ladona Horns, MD  potassium chloride 20 MEQ TBCR Take 20 mEq by mouth daily for 2 days. 10/24/20 10/26/20  Robinson, [place] N, PA-C    Physical Exam: Vitals:   05/07/21 2200 05/07/21 2226 05/07/21 2345 05/08/21 0001  BP: 137/67  139/73   Pulse: 78 75 71   Resp: (!) '29 16 18   ' Temp:  99.1 F (37.3 C) 98.2 F (36.8 C)   TempSrc:  Oral Oral   SpO2: 91% 93% 96%   Weight:    60.4 kg  Height:    '5\' 7"'  (1.702 m)   Physical Exam Constitutional:      General: She is not in acute distress.    Appearance: Normal appearance.  HENT:     Head: Normocephalic and atraumatic.     Mouth/Throat:     Mouth: Mucous membranes are moist.     Pharynx: Oropharynx  is clear.  Eyes:     Extraocular Movements: Extraocular movements intact.     Pupils: Pupils are equal, round, and reactive to light.  Cardiovascular:     Rate and Rhythm: Normal rate and regular rhythm.     Pulses: Normal pulses.     Heart sounds: Normal heart sounds.  Pulmonary:     Effort: Pulmonary effort is normal. No respiratory distress.     Breath sounds: Rales (Basilar) present.  Abdominal:     General: Bowel sounds are normal. There is no distension.     Palpations: Abdomen is soft.  Tenderness: There is no abdominal tenderness.  Musculoskeletal:        General: No swelling or deformity.     Right lower leg: Edema present.     Left lower leg: Edema present.  Skin:    General: Skin is warm and dry.  Neurological:     General: No focal deficit present.     Mental Status: Mental status is at baseline.   Labs on Admission: I have personally reviewed following labs and imaging studies  CBC: Recent Labs  Lab 05/07/21 1610  WBC 8.9  HGB 10.7*  HCT 33.2*  MCV 87.6  PLT 295    Basic Metabolic Panel: Recent Labs  Lab 05/07/21 1610  NA 136  K 3.4*  CL 103  CO2 27  GLUCOSE 451*  BUN 17  CREATININE 1.16*  CALCIUM 8.1*    GFR: Estimated Creatinine Clearance: 49.8 mL/min (A) (by C-G formula based on SCr of 1.16 mg/dL (H)).  Liver Function Tests: No results for input(s): AST, ALT, ALKPHOS, BILITOT, PROT, ALBUMIN in the last 168 hours.  Urine analysis:    Component Value Date/Time   COLORURINE YELLOW 01/16/2016 1510   APPEARANCEUR CLOUDY (A) 01/16/2016 1510   LABSPEC >1.046 (H) 01/16/2016 1510   PHURINE 5.5 01/16/2016 1510   GLUCOSEU >1000 (A) 01/16/2016 1510   HGBUR SMALL (A) 01/16/2016 1510   BILIRUBINUR NEGATIVE 01/16/2016 1510   KETONESUR NEGATIVE 01/16/2016 1510   PROTEINUR NEGATIVE 01/16/2016 1510   UROBILINOGEN 0.2 09/29/2011 2007   NITRITE POSITIVE (A) 01/16/2016 1510   LEUKOCYTESUR NEGATIVE 01/16/2016 1510    Radiological Exams on  Admission: DG Chest 2 View  Result Date: 05/07/2021 CLINICAL DATA:  Chest pain. EXAM: CHEST - 2 VIEW COMPARISON:  None. FINDINGS: Mild atelectasis and/or infiltrate is seen within the left lung base. Diffuse, chronic appearing increased lung markings are also seen. There is a small left pleural effusion. No pneumothorax is identified. The heart size and mediastinal contours are within normal limits. The visualized skeletal structures are unremarkable. IMPRESSION: 1. Mild left basilar atelectasis and/or infiltrate. 2. Small left pleural effusion. Electronically Signed   By: Virgina Norfolk M.D.   On: 05/07/2021 16:53    EKG: Independently reviewed.  Sinus rhythm at 72 bpm.  Diffuse nonspecific T wave flattening or inversion.  Possible ST elevation versus J-point elevation in the setting of LVH.  ?ST depression in some leads.  Assessment/Plan Principal Problem:   Acute CHF (congestive heart failure) (HCC) Active Problems:   Type 2 diabetes mellitus with peripheral neuropathy (HCC)   Essential hypertension   Chest pain   Hyperlipidemia LDL goal <100   Bilateral lower extremity edema  New onset CHF > Suspect new onset CHF given her shortness of breath with her chest pain and lower extremity edema. > This is also supported by BNP elevated to 648 though there is not significant fluid noted on chest x-ray.  However, does have rales and edema to thigh on exam. > Component of hypertension, urgency/emergency inducing this remains considering her blood pressure was in the 188C systolic on presentation. > Chart review reveals patient evaluated by cardiology through [facility] for her edema as well with echo plan there are no record of this.  They did note that she had a nuclear stress test done which showed an EF of 43%. > She reports that her home dose of 40 mg p.o. Lasix in the 40 mg IV Lasix she received in the ED has not increased her urination significantly. -  Appreciate cardiology  recommendations - Increase Lasix dose to 80 mg IV twice daily first dose now, may need to continue to increase until threshold level is met - Monitor on telemetry - Check magnesium - Daily weights, strict I's and O's - Echocardiogram - Trend renal function and electrolytes  Chest pain > Rule out ACS, appears less likely this time based on presentation and lab work as above seems more likely to be a degree of heart strain in the setting of new CHF. > Does have risk factors of hypertension, diabetes, tobacco use, hyperlipidemia. > Troponin trend was already flat cardiology consulted in ED. > Unable to see results of nuclear stress test done at outside hospital. - Appreciate cardiology recommendations - Continue to monitor for recurrent chest pain - Repeat EKG as needed  - Nitro as needed  - On telemetry and checking echocardiogram as above  Hypertension > Significant hypertension in ED to the 017C to 10 systolic initially responded to clonidine, Lasix, hydralazine, metoprolol in the ED. > Possibility that this significant hypertension was causing and degree of hypertensive urgency/emergency leading to heart strain and CHF above. > Blood pressure now improved to the 944 systolic - Continue home lisinopril, metoprolol - Starting on Lasix 80 mg IV twice daily as above - Could consider adding as needed IV medication if significant hypotension recurs  Diabetes > Its been on 17 units long-acting insulin in the evenings and SSI - 12 units Lantus nightly - SSI    Hypokalemia > Mild hypokalemia at 3.4 in the ED.  However she will be receiving Lasix so expect this to worsen. - 40 mEq p.o. potassium - Check magnesium.  DVT prophylaxis: Lovenox  Code Status:   Full  Family Communication:  None on admission Disposition Plan:   Patient is from:  Home  Anticipated DC to:  Home  Anticipated DC date:  1 to 2 days  Anticipated DC barriers: None  Consults called:  Cardiology, consulted by  EDP.  Admission status:  Observation, telemetry   Severity of Illness: The appropriate patient status for this patient is OBSERVATION. Observation status is judged to be reasonable and necessary in order to provide the required intensity of service to ensure the patient's safety. The patient's presenting symptoms, physical exam findings, and initial radiographic and laboratory data in the context of their medical condition is felt to place them at decreased risk for further clinical deterioration. Furthermore, it is anticipated that the patient will be medically stable for discharge from the hospital within 2 midnights of admission. The following factors support the patient status of observation.   " The patient's presenting symptoms include chest pain, shortness of breath, edema. " The physical exam findings include rales, lower extremity edema to lower thigh. " The initial radiographic and laboratory data are Lab work-up showed BMP with potassium 3.4, creatinine 1.16 which is stable for her, glucose elevated to 451.  CBC with hemoglobin of 10.7 appears to be down from baseline around 12.  Troponin flat at 24 and then 23 on repeat.  BNP elevated to 648.  Respiratory panel for flu and COVID-negative.  Chest x-ray showed mild left basilar atelectasis plus minus infiltrate with small left pleural effusion.   Marcelyn Bruins MD [facility]  How to contact the [facility] Attending or Consulting provider [place] or covering provider during after hours [place], for this patient?   Check the care team in [facility] and look for a) attending/consulting [place] provider listed and b)  the [facility] team listed Log into [email or web address] and use [place]'s universal password to access. If you do not have the password, please contact the hospital operator. Locate the [facility] provider you are looking for under [facility] and page to a number that you can be directly reached. If you still have difficulty reaching the  provider, please page the [facility] (Director on Call) for the Hospitalists listed on amion for assistance.  05/08/2021, 12:41 AM

## 2021-05-07 NOTE — ED Notes (Addendum)
Report to [place] rn with [facility]

## 2021-05-07 NOTE — ED Triage Notes (Signed)
Pt c/o CP x 3 days-NAD-steady slow gait

## 2021-05-07 NOTE — ED Notes (Signed)
ED Provider at bedside. 

## 2021-05-07 NOTE — ED Provider Notes (Signed)
[facility] EMERGENCY DEPARTMENT Provider Note   CSN: [id number] Arrival date & time: 05/07/21  1559     History Chief Complaint  Patient presents with   Chest Pain    Amber Cross is a 60 y.o. female.  Patient presents chief complaint of chest pain, shortness of breath, bilateral leg swelling.  Also associate with a cough for about a week.  Symptoms ongoing for 5 to 7 days.  Denies fevers denies vomiting denies diarrhea.  Describes chest pain as an aching pressure-like sensation that is been waxing waning for the past 3 days.      Past Medical History:  Diagnosis Date   Diabetes mellitus    Hypercholesteremia    Hypertension     Patient Active Problem List   Diagnosis Date Noted   Chest pain 05/07/2021   Atrophic vaginitis 05/08/2020   Diabetic neuropathy ([place])  07/20/2019   Tobacco use 07/20/2019   Weight loss 07/20/2019   Essential hypertension 05/03/2019   Type 2 diabetes mellitus with peripheral neuropathy ([place]) 04/06/2019    Past Surgical History:  Procedure Laterality Date   ANTERIOR CRUCIATE LIGAMENT REPAIR Right    CATARACT EXTRACTION     OVARY SURGERY     RIGHT OOPHORECTOMY Right    SMALL INTESTINE SURGERY     TUBAL LIGATION       OB History   No obstetric history on file.     Family History  Problem Relation Age of Onset   Diabetes Mother    Hypertension Mother    Hypertension Father     Social History   Tobacco Use   Smoking status: Every Day    Packs/day: 0.50    Years: 40.00    Pack years: 20.00    Types: Cigarettes   Smokeless tobacco: Never  Vaping Use   Vaping Use: Never used  Substance Use Topics   Alcohol use: Not Currently    Alcohol/week: 1.0 standard drink    Types: 1 Glasses of wine per week   Drug use: No    Home Medications Prior to Admission medications   Medication Sig Start Date End Date Taking? Authorizing Designer, industrial/product Lancets lancets Check blood sugar up to 3 times a day as  instructed 04/06/19   Katherine Roan, MD  canagliflozin (INVOKANA) 100 MG TABS tablet TAKE 2 TABLETS (200 MG TOTAL) BY MOUTH DAILY BEFORE BREAKFAST. 05/08/20 05/08/21  Mosetta Anis, MD  DULoxetine (CYMBALTA) 60 MG capsule Take 1 capsule (60 mg total) by mouth daily. 07/17/20 10/15/20  Jean Rosenthal, MD  DULoxetine (CYMBALTA) 60 MG capsule TAKE 1 CAPSULE BY MOUTH ONCE DAILY 01/07/20 01/06/21  Ladona Horns, MD  gabapentin (NEURONTIN) 300 MG capsule Take 3 capsules (900 mg total) by mouth 3 (three) times daily. 05/08/20 06/07/20  Mosetta Anis, MD  glucose blood test strip Check blood sugar up to 3 times a day as instructed 04/06/19   Katherine Roan, MD  insulin aspart (NOVOLOG) 100 UNIT/ML FlexPen INJECT 5 UNITS INTO THE SKIN 3 (THREE) TIMES DAILY WITH MEALS. 05/08/20 05/08/21  Mosetta Anis, MD  insulin detemir (LEVEMIR) 100 UNIT/ML FlexPen Inject 13 Units into the skin daily. 07/17/20   Agyei, Caprice Kluver, MD  insulin detemir (LEVEMIR) 100 UNIT/ML FlexPen Inject 13 Units into the skin daily. 05/08/20   Mosetta Anis, MD  Insulin Pen Needle 32G X 4 MM MISC USE TO INJECT INSULIN DAILY 05/08/20 05/17/21  Mosetta Anis, MD  lisinopril (  ZESTRIL) 40 MG tablet Take 1 tablet (40 mg total) by mouth daily. 07/17/20 10/15/20  Jean Rosenthal, MD  lisinopril (ZESTRIL) 40 MG tablet Take 1 tablet (40 mg total) by mouth daily. 05/08/20   Mosetta Anis, MD  lisinopril (ZESTRIL) 40 MG tablet TAKE 1 TABLET BY MOUTH ONCE DAILY 01/27/20 01/26/21  Ladona Horns, MD  metFORMIN (GLUCOPHAGE) 1000 MG tablet Take 1 tablet (1,000 mg total) by mouth 2 (two) times daily with a meal. 05/08/20 11/04/20  Mosetta Anis, MD  metFORMIN (GLUCOPHAGE) 1000 MG tablet TAKE 1 TABLET (1,000 MG TOTAL) BY MOUTH 2 (TWO) TIMES DAILY WITH A MEAL. 11/08/19 11/07/20  Ladona Horns, MD  potassium chloride 20 MEQ TBCR Take 20 mEq by mouth daily for 2 days. 10/24/20 10/26/20  Robinson, [place] N, PA-C    Allergies    Penicillins, Strawberry (diagnostic), and  Tomato  Review of Systems   Review of Systems  Constitutional:  Negative for fever.  HENT:  Negative for ear pain.   Eyes:  Negative for pain.  Respiratory:  Positive for cough and shortness of breath.   Cardiovascular:  Positive for chest pain.  Gastrointestinal:  Negative for abdominal pain.  Genitourinary:  Negative for flank pain.  Musculoskeletal:  Negative for back pain.  Skin:  Negative for rash.  Neurological:  Negative for headaches.   Physical Exam Updated Vital Signs BP 137/67   Pulse 75   Temp 99.1 F (37.3 C) (Oral)   Resp 16   Ht 5\' 7"  (1.702 m)   Wt 61.7 kg   LMP 09/29/2011   SpO2 93%   BMI 21.30 kg/m   Physical Exam Constitutional:      General: She is not in acute distress.    Appearance: Normal appearance.  HENT:     Head: Normocephalic.     Nose: Nose normal.  Eyes:     Extraocular Movements: Extraocular movements intact.  Cardiovascular:     Rate and Rhythm: Normal rate.  Pulmonary:     Effort: Pulmonary effort is normal.  Musculoskeletal:        General: Normal range of motion.     Cervical back: Normal range of motion.     Right lower leg: Edema present.     Left lower leg: Edema present.  Neurological:     General: No focal deficit present.     Mental Status: She is alert. Mental status is at baseline.    ED Results / Procedures / Treatments   Labs (all labs ordered are listed, but only abnormal results are displayed) Labs Reviewed  BASIC METABOLIC PANEL - Abnormal; Notable for the following components:      Result Value   Potassium 3.4 (*)    Glucose, Bld 451 (*)    Creatinine, Ser 1.16 (*)    Calcium 8.1 (*)    GFR, Estimated 54 (*)    All other components within normal limits  CBC - Abnormal; Notable for the following components:   RBC 3.79 (*)    Hemoglobin 10.7 (*)    HCT 33.2 (*)    All other components within normal limits  BRAIN NATRIURETIC PEPTIDE - Abnormal; Notable for the following components:   B Natriuretic  Peptide 648.5 (*)    All other components within normal limits  CBG MONITORING, ED - Abnormal; Notable for the following components:   Glucose-Capillary 285 (*)    All other components within normal limits  TROPONIN I (HIGH SENSITIVITY) - Abnormal; Notable for  the following components:   Troponin I (High Sensitivity) 24 (*)    All other components within normal limits  TROPONIN I (HIGH SENSITIVITY) - Abnormal; Notable for the following components:   Troponin I (High Sensitivity) 23 (*)    All other components within normal limits  RESP PANEL BY RT-PCR (FLU A&B, COVID) [id number]  PREGNANCY, URINE    EKG None  Radiology DG Chest 2 View  Result Date: 05/07/2021 CLINICAL DATA:  Chest pain. EXAM: CHEST - 2 VIEW COMPARISON:  None. FINDINGS: Mild atelectasis and/or infiltrate is seen within the left lung base. Diffuse, chronic appearing increased lung markings are also seen. There is a small left pleural effusion. No pneumothorax is identified. The heart size and mediastinal contours are within normal limits. The visualized skeletal structures are unremarkable. IMPRESSION: 1. Mild left basilar atelectasis and/or infiltrate. 2. Small left pleural effusion. Electronically Signed   By: Virgina Norfolk M.D.   On: 05/07/2021 16:53    Procedures Procedures   Medications Ordered in ED Medications  nitroGLYCERIN (NITROSTAT) SL tablet 0.4 mg (has no administration in time range)  insulin aspart (novoLOG) injection 15 Units (15 Units Subcutaneous Given 05/07/21 1743)  metoprolol tartrate (LOPRESSOR) tablet 50 mg (50 mg Oral Given 05/07/21 1745)  aspirin chewable tablet 324 mg (324 mg Oral Given 05/07/21 1745)  hydrALAZINE (APRESOLINE) tablet 25 mg (25 mg Oral Given 05/07/21 1855)  cloNIDine (CATAPRES) tablet 0.2 mg (0.2 mg Oral Given 05/07/21 2000)  furosemide (LASIX) injection 40 mg (40 mg Intravenous Given 05/07/21 2033)    ED Course  I have reviewed the triage vital signs and the nursing  notes.  Pertinent labs & imaging results that were available during my care of the patient were reviewed by me and considered in my medical decision making (see chart for details).    MDM Rules/Calculators/A&P                           Initial EKG concerning for ST elevations in V1 V2 with diffuse ST depressions.  This also has diffuse Q waves, I did discuss this EKG with on-call STEMI physician and ultimately no STEMI was activated medical management pursued.  Troponin mildly elevated repeat troponin unchanged.  Patient is hypertensive in the 295A systolic.  Given multiple blood pressure medication with ultimate improvement of blood pressure.  Patient given Lasix.  Case discussed with on-call cardiologist who will see the patient on an inpatient basis.  Will be admitted to the medical team.  Final Clinical Impression(s) / ED Diagnoses Final diagnoses:  Chest pain, unspecified type  Hypertension, unspecified type  Congestive heart failure, unspecified HF chronicity, unspecified heart failure type [facility])    Rx / DC Orders ED Discharge Orders     None        Luna Fuse, MD 05/07/21 2228

## 2021-05-08 ENCOUNTER — Observation Stay (HOSPITAL_COMMUNITY): Payer: BLUE CROSS/BLUE SHIELD

## 2021-05-08 ENCOUNTER — Encounter (HOSPITAL_COMMUNITY): Payer: Self-pay | Admitting: Family Medicine

## 2021-05-08 DIAGNOSIS — Z79899 Other long term (current) drug therapy: Secondary | ICD-10-CM | POA: Diagnosis not present

## 2021-05-08 DIAGNOSIS — I110 Hypertensive heart disease with heart failure: Secondary | ICD-10-CM | POA: Diagnosis present

## 2021-05-08 DIAGNOSIS — I5023 Acute on chronic systolic (congestive) heart failure: Secondary | ICD-10-CM | POA: Diagnosis present

## 2021-05-08 DIAGNOSIS — Z794 Long term (current) use of insulin: Secondary | ICD-10-CM | POA: Diagnosis not present

## 2021-05-08 DIAGNOSIS — I509 Heart failure, unspecified: Secondary | ICD-10-CM | POA: Diagnosis not present

## 2021-05-08 DIAGNOSIS — R0609 Other forms of dyspnea: Secondary | ICD-10-CM | POA: Diagnosis not present

## 2021-05-08 DIAGNOSIS — E1142 Type 2 diabetes mellitus with diabetic polyneuropathy: Secondary | ICD-10-CM | POA: Diagnosis present

## 2021-05-08 DIAGNOSIS — Z90721 Acquired absence of ovaries, unilateral: Secondary | ICD-10-CM | POA: Diagnosis not present

## 2021-05-08 DIAGNOSIS — D509 Iron deficiency anemia, unspecified: Secondary | ICD-10-CM | POA: Diagnosis present

## 2021-05-08 DIAGNOSIS — E859 Amyloidosis, unspecified: Secondary | ICD-10-CM | POA: Diagnosis present

## 2021-05-08 DIAGNOSIS — I428 Other cardiomyopathies: Secondary | ICD-10-CM | POA: Diagnosis present

## 2021-05-08 DIAGNOSIS — Z20822 Contact with and (suspected) exposure to covid-19: Secondary | ICD-10-CM | POA: Diagnosis present

## 2021-05-08 DIAGNOSIS — I5021 Acute systolic (congestive) heart failure: Secondary | ICD-10-CM | POA: Diagnosis not present

## 2021-05-08 DIAGNOSIS — R636 Underweight: Secondary | ICD-10-CM | POA: Diagnosis present

## 2021-05-08 DIAGNOSIS — E785 Hyperlipidemia, unspecified: Secondary | ICD-10-CM | POA: Diagnosis not present

## 2021-05-08 DIAGNOSIS — I5041 Acute combined systolic (congestive) and diastolic (congestive) heart failure: Secondary | ICD-10-CM | POA: Diagnosis not present

## 2021-05-08 DIAGNOSIS — Z681 Body mass index (BMI) 19 or less, adult: Secondary | ICD-10-CM | POA: Diagnosis not present

## 2021-05-08 DIAGNOSIS — I10 Essential (primary) hypertension: Secondary | ICD-10-CM | POA: Diagnosis not present

## 2021-05-08 DIAGNOSIS — I340 Nonrheumatic mitral (valve) insufficiency: Secondary | ICD-10-CM | POA: Diagnosis present

## 2021-05-08 DIAGNOSIS — I5020 Unspecified systolic (congestive) heart failure: Secondary | ICD-10-CM | POA: Diagnosis not present

## 2021-05-08 DIAGNOSIS — E7800 Pure hypercholesterolemia, unspecified: Secondary | ICD-10-CM | POA: Diagnosis present

## 2021-05-08 DIAGNOSIS — F17210 Nicotine dependence, cigarettes, uncomplicated: Secondary | ICD-10-CM | POA: Diagnosis present

## 2021-05-08 DIAGNOSIS — R0789 Other chest pain: Secondary | ICD-10-CM | POA: Diagnosis present

## 2021-05-08 DIAGNOSIS — E876 Hypokalemia: Secondary | ICD-10-CM | POA: Diagnosis present

## 2021-05-08 DIAGNOSIS — E11649 Type 2 diabetes mellitus with hypoglycemia without coma: Secondary | ICD-10-CM | POA: Diagnosis not present

## 2021-05-08 DIAGNOSIS — R079 Chest pain, unspecified: Secondary | ICD-10-CM | POA: Diagnosis present

## 2021-05-08 DIAGNOSIS — I3139 Other pericardial effusion (noninflammatory): Secondary | ICD-10-CM | POA: Diagnosis present

## 2021-05-08 DIAGNOSIS — D638 Anemia in other chronic diseases classified elsewhere: Secondary | ICD-10-CM | POA: Diagnosis present

## 2021-05-08 DIAGNOSIS — I160 Hypertensive urgency: Secondary | ICD-10-CM | POA: Diagnosis present

## 2021-05-08 DIAGNOSIS — I248 Other forms of acute ischemic heart disease: Secondary | ICD-10-CM | POA: Diagnosis present

## 2021-05-08 DIAGNOSIS — C9110 Chronic lymphocytic leukemia of B-cell type not having achieved remission: Secondary | ICD-10-CM | POA: Diagnosis present

## 2021-05-08 LAB — CBC
HCT: 28.1 % — ABNORMAL LOW (ref 36.0–46.0)
Hemoglobin: 9.1 g/dL — ABNORMAL LOW (ref 12.0–15.0)
MCH: 28.4 pg (ref 26.0–34.0)
MCHC: 32.4 g/dL (ref 30.0–36.0)
MCV: 87.8 fL (ref 80.0–100.0)
Platelets: 275 10*3/uL (ref 150–400)
RBC: 3.2 MIL/uL — ABNORMAL LOW (ref 3.87–5.11)
RDW: 14.1 % (ref 11.5–15.5)
WBC: 8.8 10*3/uL (ref 4.0–10.5)
nRBC: 0 % (ref 0.0–0.2)

## 2021-05-08 LAB — BASIC METABOLIC PANEL
Anion gap: 6 (ref 5–15)
BUN: 18 mg/dL (ref 6–20)
CO2: 28 mmol/L (ref 22–32)
Calcium: 7.8 mg/dL — ABNORMAL LOW (ref 8.9–10.3)
Chloride: 104 mmol/L (ref 98–111)
Creatinine, Ser: 1.18 mg/dL — ABNORMAL HIGH (ref 0.44–1.00)
GFR, Estimated: 53 mL/min — ABNORMAL LOW (ref >60)
Glucose, Bld: 196 mg/dL — ABNORMAL HIGH (ref 70–99)
Potassium: 3.2 mmol/L — ABNORMAL LOW (ref 3.5–5.1)
Sodium: 138 mmol/L (ref 135–145)

## 2021-05-08 LAB — ECHOCARDIOGRAM COMPLETE
AR max vel: 1.94 cm2
AV Area VTI: 2.21 cm2
AV Area mean vel: 2.05 cm2
AV Mean grad: 3 mmHg
AV Peak grad: 4.6 mmHg
Ao pk vel: 1.07 m/s
Area-P 1/2: 3.05 cm2
Calc EF: 38.4 %
Height: 67 in
S' Lateral: 4.1 cm
Single Plane A2C EF: 42.6 %
Single Plane A4C EF: 36.5 %
Weight: 2130.53 oz

## 2021-05-08 LAB — HIV ANTIBODY (ROUTINE TESTING W REFLEX): HIV Screen 4th Generation wRfx: NONREACTIVE

## 2021-05-08 LAB — GLUCOSE, CAPILLARY
Glucose-Capillary: 151 mg/dL — ABNORMAL HIGH (ref 70–99)
Glucose-Capillary: 146 mg/dL — ABNORMAL HIGH (ref 70–99)
Glucose-Capillary: 95 mg/dL (ref 70–99)
Glucose-Capillary: 160 mg/dL — ABNORMAL HIGH (ref 70–99)
Glucose-Capillary: 187 mg/dL — ABNORMAL HIGH (ref 70–99)

## 2021-05-08 LAB — MAGNESIUM: Magnesium: 1.5 mg/dL — ABNORMAL LOW (ref 1.7–2.4)

## 2021-05-08 MED ORDER — CARVEDILOL 6.25 MG PO TABS
6.2500 mg | ORAL_TABLET | Freq: Two times a day (BID) | ORAL | Status: DC
  Administered 2021-05-09 – 2021-05-10 (×3): 6.25 mg via ORAL
  Filled 2021-05-08 (×3): qty 1

## 2021-05-08 MED ORDER — MAGNESIUM SULFATE 2 GM/50ML IV SOLN
2.0000 g | Freq: Once | INTRAVENOUS | Status: AC
  Administered 2021-05-08: 2 g via INTRAVENOUS
  Filled 2021-05-08: qty 50

## 2021-05-08 MED ORDER — LOSARTAN POTASSIUM 50 MG PO TABS
50.0000 mg | ORAL_TABLET | Freq: Every day | ORAL | Status: DC
  Administered 2021-05-08 – 2021-05-10 (×3): 50 mg via ORAL
  Filled 2021-05-08 (×3): qty 1

## 2021-05-08 MED ORDER — METOPROLOL TARTRATE 50 MG PO TABS
50.0000 mg | ORAL_TABLET | Freq: Two times a day (BID) | ORAL | Status: DC
  Administered 2021-05-08: 50 mg via ORAL
  Filled 2021-05-08: qty 1

## 2021-05-08 MED ORDER — INSULIN GLARGINE-YFGN 100 UNIT/ML ~~LOC~~ SOLN
12.0000 [IU] | Freq: Every day | SUBCUTANEOUS | Status: DC
  Administered 2021-05-08 (×2): 12 [IU] via SUBCUTANEOUS
  Filled 2021-05-08 (×3): qty 0.12

## 2021-05-08 MED ORDER — HYDROCODONE-ACETAMINOPHEN 5-325 MG PO TABS
1.0000 | ORAL_TABLET | Freq: Four times a day (QID) | ORAL | Status: AC | PRN
  Administered 2021-05-08 – 2021-05-10 (×3): 1 via ORAL
  Filled 2021-05-08 (×4): qty 1

## 2021-05-08 MED ORDER — GABAPENTIN 300 MG PO CAPS
300.0000 mg | ORAL_CAPSULE | Freq: Three times a day (TID) | ORAL | Status: DC
  Administered 2021-05-08 – 2021-05-10 (×7): 300 mg via ORAL
  Filled 2021-05-08 (×8): qty 1

## 2021-05-08 MED ORDER — HYDRALAZINE HCL 25 MG PO TABS
25.0000 mg | ORAL_TABLET | Freq: Once | ORAL | Status: AC
  Administered 2021-05-08: 25 mg via ORAL
  Filled 2021-05-08: qty 1

## 2021-05-08 MED ORDER — POTASSIUM CHLORIDE CRYS ER 20 MEQ PO TBCR
20.0000 meq | EXTENDED_RELEASE_TABLET | Freq: Every day | ORAL | Status: DC
  Administered 2021-05-08: 20 meq via ORAL
  Filled 2021-05-08: qty 1

## 2021-05-08 MED ORDER — FUROSEMIDE 10 MG/ML IJ SOLN
80.0000 mg | Freq: Two times a day (BID) | INTRAMUSCULAR | Status: DC
  Administered 2021-05-08 – 2021-05-10 (×6): 80 mg via INTRAVENOUS
  Filled 2021-05-08 (×6): qty 8

## 2021-05-08 MED ORDER — ATORVASTATIN CALCIUM 80 MG PO TABS
80.0000 mg | ORAL_TABLET | Freq: Every day | ORAL | Status: DC
  Administered 2021-05-09 – 2021-05-10 (×2): 80 mg via ORAL
  Filled 2021-05-08 (×2): qty 1

## 2021-05-08 MED ORDER — INSULIN ASPART 100 UNIT/ML IJ SOLN
0.0000 [IU] | Freq: Every day | INTRAMUSCULAR | Status: DC
  Administered 2021-05-09: 4 [IU] via SUBCUTANEOUS

## 2021-05-08 MED ORDER — POTASSIUM CHLORIDE CRYS ER 20 MEQ PO TBCR
40.0000 meq | EXTENDED_RELEASE_TABLET | Freq: Once | ORAL | Status: AC
  Administered 2021-05-08: 40 meq via ORAL
  Filled 2021-05-08: qty 2

## 2021-05-08 MED ORDER — TRAZODONE HCL 50 MG PO TABS
50.0000 mg | ORAL_TABLET | Freq: Every evening | ORAL | Status: DC | PRN
  Administered 2021-05-08 – 2021-05-09 (×2): 50 mg via ORAL
  Filled 2021-05-08 (×2): qty 1

## 2021-05-08 MED ORDER — INSULIN ASPART 100 UNIT/ML IJ SOLN
0.0000 [IU] | Freq: Three times a day (TID) | INTRAMUSCULAR | Status: DC
  Administered 2021-05-08: 3 [IU] via SUBCUTANEOUS
  Administered 2021-05-08: 2 [IU] via SUBCUTANEOUS
  Administered 2021-05-09 – 2021-05-10 (×3): 3 [IU] via SUBCUTANEOUS

## 2021-05-08 NOTE — Progress Notes (Signed)
  Echocardiogram 2D Echocardiogram has been performed.  Amber Cross F 05/08/2021, 10:28 AM

## 2021-05-08 NOTE — Consult Note (Addendum)
Cardiology Consultation:   Patient ID: Amber Cross MRN: [id number]; DOB: March 05, 1961  Admit date: 05/07/2021 Date of Consult: 05/08/2021  PCP:  Jenel Lucks, [facility] Cardiologist:  Dolan Amen, DO     Patient Profile:   Amber Cross is a 60 y.o. female with a hx of hypertension, hyperlipidemia, diabetes mellitus, tobacco and marijuana smoking who is being seen 05/08/2021 for the evaluation of chest pain and shortness of breath at the request of Dr. Lupita Leash.  Amber Cross dealing with chest pain, shortness of breath, cough and lower extremity edema since summer 2022.  She was admitted at [facility] overnight January 23, 2021 for same problem.  She was diuresed.  Stress test showed no reversible ischemia or infarction.  EF noted to be 43%.  Plan for outpatient echocardiogram.  Establish care with cardiology March 30, 2021 for ongoing lower extremity edema.  Recommended decrease sodium intake.  Increase Lasix to 40 mg daily.  Recommended strict blood pressure control.  Order echo but has not done yet.  History of Present Illness:   Amber Cross presented to [facility] with 3 days history of persistent chest pain.  She describes her pain as "dull achy" at substernal area.  She also has shortness of breath, lower extremity edema and intermittent cough.  Reports intermittent orthopnea.  Patient is very high salt diet.  Blood pressure elevated.  Patient reports her PCP has stopped Lasix about 2 weeks ago.  Patient reports her chest pain was less frequent and less intensity while taking Lasix.  In ER she was noted to have hypertensive urgency with systolic blood pressure in 180 to 190s. BNP 648 High-sensitivity troponin 24>>23 Respiratory panel negative for COVID and influenza Chest x-ray with mild basilar atelectasis +/- infiltrate.  Small left pleural effusion.  She was given IV Lasix 40 mg x 1, now on IV Lasix 80  mg twice daily.  Breathing has been improved.  Net I&O -1.6 L.  Weight 136>>>133lb.  On Lopressor 50mg  BID and Lisinopril 40mg  qd.  Supplemented for low magnesium and potassium Hemoglobin 10.7>>9.1 Echocardiogram done pending reading  Patient smokes 1.5 to 1 pack/day since age of 33.  She smokes marijuana but denies cocaine.  No alcohol abuse.  Denies family history of CAD or heart disease   Past Medical History:  Diagnosis Date   Diabetes mellitus    Hypercholesteremia    Hypertension    Tobacco abuse     Past Surgical History:  Procedure Laterality Date   ANTERIOR CRUCIATE LIGAMENT REPAIR Right    CATARACT EXTRACTION     OVARY SURGERY     RIGHT OOPHORECTOMY Right    SMALL INTESTINE SURGERY     TUBAL LIGATION       Inpatient Medications: Scheduled Meds:  [START ON 05/09/2021] atorvastatin  80 mg Oral Daily   enoxaparin (LOVENOX) injection  40 mg Subcutaneous Q24H   furosemide  80 mg Intravenous BID   gabapentin  300 mg Oral TID   insulin aspart  0-15 Units Subcutaneous TID WC   insulin aspart  0-5 Units Subcutaneous QHS   insulin glargine-yfgn  12 Units Subcutaneous QHS   lisinopril  40 mg Oral Daily   metoprolol tartrate  50 mg Oral BID   potassium chloride  20 mEq Oral Daily   sodium chloride flush  3 mL Intravenous Q12H   Continuous Infusions:  PRN Meds: acetaminophen **OR** acetaminophen, nitroGLYCERIN, polyethylene glycol  Allergies:  Allergies  Allergen Reactions   Penicillins     itch   Strawberry (Diagnostic) Itching   Tomato Itching    Social History:   Social History   Socioeconomic History   Marital status: Married    Spouse name: Not on file   Number of children: Not on file   Years of education: Not on file   Highest education level: Not on file  Occupational History   Not on file  Tobacco Use   Smoking status: Every Day    Packs/day: 0.50    Years: 40.00    Pack years: 20.00    Types: Cigarettes   Smokeless tobacco: Never   Vaping Use   Vaping Use: Never used  Substance and Sexual Activity   Alcohol use: Not Currently    Alcohol/week: 1.0 standard drink    Types: 1 Glasses of wine per week   Drug use: No   Sexual activity: Not Currently    Birth control/protection: Abstinence  Other Topics Concern   Not on file  Social History Narrative   Not on file   Social Determinants of Health   Financial Resource Strain: Not on file  Food Insecurity: Not on file  Transportation Needs: Not on file  Physical Activity: Not on file  Stress: Not on file  Social Connections: Not on file  Intimate Partner Violence: Not on file    Family History:    Family History  Problem Relation Age of Onset   Diabetes Mother    Hypertension Mother    Hypertension Father      ROS:  Please see the history of present illness.   All other ROS reviewed and negative.     Physical Exam/Data:   Vitals:   05/08/21 0001 05/08/21 0410 05/08/21 0900 05/08/21 1137  BP:  (!) 174/81 (!) 166/82 (!) 164/89  Pulse:  80 82 72  Resp:  18 20 18   Temp:  98.1 F (36.7 C)  98.5 F (36.9 C)  TempSrc:  Oral  Oral  SpO2:  96%  99%  Weight: 60.4 kg     Height: 5\' 7"  (1.702 m)       Intake/Output Summary (Last 24 hours) at 05/08/2021 1352 Last data filed at 05/08/2021 0923 Gross per 24 hour  Intake 580 ml  Output 2250 ml  Net -1670 ml   Last 3 Weights 05/08/2021 05/07/2021 10/24/2020  Weight (lbs) 133 lb 2.5 oz 136 lb 108 lb  Weight (kg) 60.4 kg 61.689 kg 48.988 kg     Body mass index is 20.86 kg/m.  General:  Well nourished, well developed, in no acute distress HEENT: normal Neck: no JVD Vascular: No carotid bruits; Distal pulses 2+ bilaterally Cardiac:  normal S1, S2; RRR; no murmur  Lungs:  clear to auscultation bilaterally, no wheezing, rhonchi or rales  Abd: soft, nontender, no hepatomegaly  Ext: 1-2+ BL LE edema R > L Musculoskeletal:  No deformities, BUE and BLE strength normal and equal Skin: warm and dry   Neuro:  CNs 2-12 intact, no focal abnormalities noted Psych:  Normal affect   EKG:  The EKG was personally reviewed and demonstrates:  Sinus tachycardia 102 bpm, LVH Telemetry:  Telemetry was personally reviewed and demonstrates: Sinus rhythm, PVC  Relevant CV Studies:  Stress test 01/24/2021 FINDINGS:  Perfusion: Regional images of the tracheal myocardium show no fixed  defect to suggest infarct or reversible lesion to suggest ischemic  change. No abnormality is appreciable on the SPECT study.  Wall Motion: There is mild generalized left ventricular hypokinesis  without focal wall motion abnormality.   Left Ventricular Ejection Fraction: 43 %   End diastolic volume 703 ml   End systolic volume 74 ml   Limited chest CT obtained for attenuation correction demonstrates  mild aortic atherosclerosis and coronary artery calcification. Areas  of mild lung atelectasis noted.   IMPRESSION:  1. No reversible ischemia or infarction.   2. Mild generalized left ventricular hypokinesis without focal wall  motion abnormality.   3. Left ventricular ejection fraction 43%   4. Non invasive risk stratification*: Intermediate   Laboratory Data:  High Sensitivity Troponin:   Recent Labs  Lab 05/07/21 1610 05/07/21 1833  TROPONINIHS 24* 23*     Chemistry Recent Labs  Lab 05/07/21 1610 05/08/21 0205  NA 136 138  K 3.4* 3.2*  CL 103 104  CO2 27 28  GLUCOSE 451* 196*  BUN 17 18  CREATININE 1.16* 1.18*  CALCIUM 8.1* 7.8*  MG  --  1.5*  GFRNONAA 54* 53*  ANIONGAP 6 6     Hematology Recent Labs  Lab 05/07/21 1610 05/08/21 0205  WBC 8.9 8.8  RBC 3.79* 3.20*  HGB 10.7* 9.1*  HCT 33.2* 28.1*  MCV 87.6 87.8  MCH 28.2 28.4  MCHC 32.2 32.4  RDW 14.3 14.1  PLT 323 275    BNP Recent Labs  Lab 05/07/21 1610  BNP 648.5*    Radiology/Studies:  DG Chest 2 View  Result Date: 05/07/2021 CLINICAL DATA:  Chest pain. EXAM: CHEST - 2 VIEW COMPARISON:  None. FINDINGS:  Mild atelectasis and/or infiltrate is seen within the left lung base. Diffuse, chronic appearing increased lung markings are also seen. There is a small left pleural effusion. No pneumothorax is identified. The heart size and mediastinal contours are within normal limits. The visualized skeletal structures are unremarkable. IMPRESSION: 1. Mild left basilar atelectasis and/or infiltrate. 2. Small left pleural effusion. Electronically Signed   By: Virgina Norfolk M.D.   On: 05/07/2021 16:53     Assessment and Plan:   Acute CHF/lower extremity edema/shortness of breath Patient with at least 17-month history of shortness of breath, cough, lower extremity edema and intermittent orthopnea.  She also has chest pain.  EF of 40% by stress test in June 2022.  Has pending echocardiogram since then.  Chest pain was less while on diuretic.  She was not "peeing much on Lasix" and discontinued medication by PCP few weeks ago. -Her symptoms likely due to high salt intake in diet and uncontrolled blood pressure, consistence with NICM.  -Echocardiogram done, pending reading -Continue IV Lasix -Continue BB and lisinopril  2. Uncontrolled hypertension -Patient reports her blood pressure always runs above 500 systolically all time. -Blood pressure improved but remain elevated -Will adjust medication after discussion with MD (consider changing lopressor to Coreg)  3.  Chest pain -Seems due to uncontrolled blood pressure and volume overload -Troponin low flat -No evidence of ischemia or infarction by stress test 12/2020  4.  Diabetes mellitus -Per primary team  5.  Hypokalemia 6.  Hypomagnesemia -Supplemented by primary team.  Keep K above 4 and Mg above 2.  7. Tobacco abuse - Recommended cessation   8. Anemia - Per primary team    Risk Assessment/Risk Scores:   HEAR Score (for undifferentiated chest pain):  HEAR Score: 4  New York Heart Association (NYHA) Functional Class NYHA Class II     For  questions or updates, please contact [place]  Please consult [email or web address] for contact info under    Jarrett Soho, PA  05/08/2021 1:52 PM

## 2021-05-08 NOTE — Progress Notes (Signed)
Patient mentioned to the RN that she has been having loose stools episodes on and off for weeks now. At shift assessment, patient said her last episode was on 10/10 at home with two loose stools. Today, RN saw a formed stool at 0930, and patient said she has had two loose stools after lunch and at 1400. RN assessed the patient and the patient verbalize that she does not feel nauseous and is concerned that her heart medications is the cause for the loose stools. Pharmacist made aware. Patient also stated that she has no abdominal pain or tenderness and her VS- temperature have been within normal range. Maren Beach, MD paged and notified. Initiated enteric precautions and order lab stools to send. RN spoke to Victor, MD per C-Diff consult protocol and reported the patient's current status of 2 loose bowel movements, no fever, current WBC lab results, no abdominal pain or tenderness; Wyatt, MD states to only do the gastro-panel and not the C-Diff PCR and to also discontinue to the precautions. Ramesh MD paged and notified on Wyatt,MD recommendations. Verbal order from [place] MD to d/c C-Diff collection lab order and to d/c enteric precautions.  RN explained to the patient what the MD has ordered. RN placed a new hat in the [facility] for the specimen. RN educated the patient to call NT or RN when the patient is able to have a BM to collect the sample. Patient express understanding.  1830: RN collected a stool sample and walked down to lab.

## 2021-05-08 NOTE — Progress Notes (Signed)
PROGRESS NOTE    Amber Cross  DZH:[id number] DOB: Jan 31, 1961 DOA: 05/07/2021 PCP: Jenel Lucks, PA-C   Chief Complaint  Patient presents with   Chest Pain  Brief Narrative/Hospital Course: 60 year old female with diabetes, hypertension, tobacco abuse, HLD presents with chest pain shortness of breath lightest, symptom onset about a week ago.  She has had neck swelling for about 2 months saw cardiologist at antibiotics about a month ago had a stress test with EF 43%, Lasix was increased to 40 mg but never had significant increase in urine output saw her primary care doctor discontinued about 2 weeks ago. Patient reported orthopnea no fever chills abdominal pain. In the ED blood pressure in 110s to 150s, hyperglycemia 451 hemoglobin 10.7 and troponin 24> 23 flat chest x-ray mild left basilar atelectasis plus minus infiltrate with small left pleural effusion, elevated BNP 648 and respiratory virus panel COVID-19 negative.  Patient was given Lasix Claritin hydralazine metoprolol nitro insulin aspirin and admitted w/ consult with cardiology static changes but no STEMI She did not respond to initial 40 mg IV Lasix so increased dose to 80 mg.   Subjective:  Reports she feels better no shortness of breaht and leg swelling improving, still canont lay flat Afebrile overnight purpuras 130s to 170s.  Labs showed hypokalemia, and anemia  Assessment & Plan:  Shortness of breath/orthopnea Leg swelling Acute CHF suspected: Symptoms concerning for CHF with elevated BNP 648, chest x-ray small pleural effusion.  Continue diuresis follow-up echocardiogram, cardiology consulted. Monitor strict I/O,daly weight, electrolytes, Cont salt and fluid restricted diet. GDMT:pending echo,per cardio. UOP 1850 ml24 hr Normal wt ~ 120lb ( 2 months ago) [employer]   05/07/21 1608 05/08/21 0001  Weight: 61.7 kg 60.4 kg  Net IO Since Admission: -1,610 mL [05/08/21 0831]    Chest pain: Troponin flat  likely demand ischemia in the setting of above.  Await cardiology input. No chest pain this am.  Type 2 diabetes mellitus with peripheral neuropathy :last hba1c >14 in 3/22.sugar controlled on 12 u semeglee and ssi.hold home invokona,premeal insulin 5 u.cont neurnotin Recent Labs  Lab 05/07/21 1840 05/08/21 0129 05/08/21 0633  GLUCAP 285* 151* 146*    Essential hypertension: Poorly controlled on admission.  On lisinopril and metoprolol and Lasix, continue and titrate meds.  Hyperlipidemia LDL goal <100-not on statins.  Anemia likely chronic disease, monitor Recent Labs  Lab 05/07/21 1610 05/08/21 0205  HGB 10.7* 9.1*  HCT 33.2* 28.1*     Hypokalemia Hypomagnesemia: Will replete  Active tobacco use- advised cessation, declined nicotine patch  DVT prophylaxis: enoxaparin (LOVENOX) injection 40 mg Start: 05/08/21 1000 Code Status:   Code Status: Full Code Family Communication: plan of care discussed with patient at bedside. Status is: admitted as Observation  Remains hospitalized for ongoing management of leg edema shortness of breath with iv lasix and  concern for acute CHF Dispo: The patient is from: Home with son, ambulatory and independent at baseline              Anticipated d/c is to: Home              Patient currently is not medically stable to d/c.   Difficult to place patient No  Objective: Vitals: Today's Vitals   05/07/21 2226 05/07/21 2345 05/08/21 0001 05/08/21 0410  BP:  139/73  (!) 174/81  Pulse: 75 71  80  Resp: 16 18  18   Temp: 99.1 F (37.3 C) 98.2 F (36.8 C)  98.1 F (36.7  C)  TempSrc: Oral Oral  Oral  SpO2: 93% 96%  96%  Weight:   60.4 kg   Height:   5\' 7"  (1.702 m)   PainSc:   0-No pain    Physical Examination: General exam: AAox3, weak,older than stated age. HEENT:Oral mucosa moist, Ear/Nose WNL grossly,dentition normal. Respiratory system: B/l diminished BS, no use of accessory muscle, non tender. Cardiovascular system: S1 & S2  +,+JVD. Gastrointestinal system: Abdomen soft, NT,ND, BS+. Nervous System:Alert, awake, moving extremities. Extremities: edema + bl, distal peripheral pulses palpable.  Skin: No rashes, no icterus. MSK: Normal muscle bulk, tone, power.  Medications reviewed:  Scheduled Meds:  [START ON 05/09/2021] atorvastatin  80 mg Oral Daily   enoxaparin (LOVENOX) injection  40 mg Subcutaneous Q24H   furosemide  80 mg Intravenous BID   gabapentin  300 mg Oral TID   insulin aspart  0-15 Units Subcutaneous TID WC   insulin aspart  0-5 Units Subcutaneous QHS   insulin glargine-yfgn  12 Units Subcutaneous QHS   lisinopril  40 mg Oral Daily   metoprolol tartrate  50 mg Oral BID   potassium chloride  20 mEq Oral Daily   potassium chloride  40 mEq Oral Once   sodium chloride flush  3 mL Intravenous Q12H   Continuous Infusions:  magnesium sulfate bolus IVPB      FEN: Diet Order             Diet heart healthy/carb modified Room service appropriate? Yes; Fluid consistency: Thin; Fluid restriction: 2000 mL Fluid  Diet effective now                   Intake/Output  Intake/Output Summary (Last 24 hours) at 05/08/2021 0831 Last data filed at 05/08/2021 0639 Gross per 24 hour  Intake 240 ml  Output 1850 ml  Net -1610 ml   Intake/Output from previous day: 10/10 0701 - 10/11 0700 In: 240 [P.O.:240] Out: 1850 [BPZWC:5852] Net IO Since Admission: -1,610 mL [05/08/21 0831]   Weight change:   Wt Readings from Last 3 Encounters:  05/08/21 60.4 kg  10/24/20 49 kg  10/24/20 49.4 kg     Consultants:see note  Procedures:see note Antimicrobials: Anti-infectives (From admission, onward)    None      Culture/Microbiology No results found for: SDES, [place], CULT, REPTSTATUS  Other culture-see note  Unresulted Labs (From admission, onward)     Start     Ordered   05/14/21 0500  Creatinine, serum  (enoxaparin (LOVENOX)    CrCl >/= 30 ml/min)  Weekly,   R     Comments: while on  enoxaparin therapy    05/07/21 2356   05/09/21 7782  Basic metabolic panel  Daily,   R     Question:  Specimen collection method  Answer:  Lab=Lab collect   05/08/21 0831   05/09/21 0500  CBC  Daily,   R     Question:  Specimen collection method  Answer:  Lab=Lab collect   05/08/21 0831   05/07/21 2354  HIV Antibody (routine testing w rflx)  (HIV Antibody (Routine testing w reflex) panel)  Once,   R        05/07/21 2356   05/07/21 1610  Pregnancy, urine  Once,   R        05/07/21 1610            Data Reviewed: I have personally reviewed following labs and imaging studies CBC: Recent Labs  Lab 05/07/21 1610 05/08/21  0205  WBC 8.9 8.8  HGB 10.7* 9.1*  HCT 33.2* 28.1*  MCV 87.6 87.8  PLT 323 371   Basic Metabolic Panel: Recent Labs  Lab 05/07/21 1610 05/08/21 0205  NA 136 138  K 3.4* 3.2*  CL 103 104  CO2 27 28  GLUCOSE 451* 196*  BUN 17 18  CREATININE 1.16* 1.18*  CALCIUM 8.1* 7.8*  MG  --  1.5*   GFR: Estimated Creatinine Clearance: 48.9 mL/min (A) (by C-G formula based on SCr of 1.18 mg/dL (H)). Liver Function Tests: No results for input(s): AST, ALT, ALKPHOS, BILITOT, PROT, ALBUMIN in the last 168 hours. No results for input(s): LIPASE, AMYLASE in the last 168 hours. No results for input(s): AMMONIA in the last 168 hours. Coagulation Profile: No results for input(s): INR, PROTIME in the last 168 hours. Cardiac Enzymes: No results for input(s): CKTOTAL, CKMB, CKMBINDEX, TROPONINI in the last 168 hours. BNP (last 3 results) No results for input(s): PROBNP in the last 8760 hours. HbA1C: No results for input(s): HGBA1C in the last 72 hours. CBG: Recent Labs  Lab 05/07/21 1840 05/08/21 0129 05/08/21 0633  GLUCAP 285* 151* 146*   Lipid Profile: No results for input(s): CHOL, HDL, LDLCALC, TRIG, CHOLHDL, LDLDIRECT in the last 72 hours. Thyroid Function Tests: No results for input(s): TSH, T4TOTAL, FREET4, T3FREE, THYROIDAB in the last 72 hours. Anemia  Panel: No results for input(s): VITAMINB12, FOLATE, FERRITIN, TIBC, IRON, RETICCTPCT in the last 72 hours. Sepsis Labs: No results for input(s): PROCALCITON, LATICACIDVEN in the last 168 hours.  Recent Results (from the past 240 hour(s))  Resp Panel by RT-PCR (Flu A&B, Covid) Nasopharyngeal Swab     Status: None   Collection Time: 05/07/21  5:44 PM   Specimen: Nasopharyngeal Swab; Nasopharyngeal(NP) swabs in vial transport medium  Result Value Ref Range Status   SARS Coronavirus 2 by RT PCR NEGATIVE NEGATIVE Final    Comment: (NOTE) SARS-CoV-2 target nucleic acids are NOT DETECTED.  The SARS-CoV-2 RNA is generally detectable in upper respiratory specimens during the acute phase of infection. The lowest concentration of SARS-CoV-2 viral copies this assay can detect is 138 copies/mL. A negative result does not preclude SARS-Cov-2 infection and should not be used as the sole basis for treatment or other patient management decisions. A negative result may occur with  improper specimen collection/handling, submission of specimen other than nasopharyngeal swab, presence of viral mutation(s) within the areas targeted by this assay, and inadequate number of viral copies(<138 copies/mL). A negative result must be combined with clinical observations, patient history, and epidemiological information. The expected result is Negative.  Fact Sheet for Patients:  EntrepreneurPulse.com.au  Fact Sheet for Healthcare Providers:  IncredibleEmployment.be  This test is no t yet approved or cleared by the [place] FDA and  has been authorized for detection and/or diagnosis of SARS-CoV-2 by FDA under an Emergency Use Authorization (EUA). This EUA will remain  in effect (meaning this test can be used) for the duration of the COVID-19 declaration under Section 564(b)(1) of the Act, 21 U.S.C.section 360bbb-3(b)(1), unless the authorization is terminated  or  revoked sooner.       Influenza A by PCR NEGATIVE NEGATIVE Final   Influenza B by PCR NEGATIVE NEGATIVE Final    Comment: (NOTE) The Xpert Xpress SARS-CoV-2/FLU/RSV plus assay is intended as an aid in the diagnosis of influenza from Nasopharyngeal swab specimens and should not be used as a sole basis for treatment. Nasal washings and aspirates are unacceptable for  Xpert Xpress SARS-CoV-2/FLU/RSV testing.  Fact Sheet for Patients: EntrepreneurPulse.com.au  Fact Sheet for Healthcare Providers: IncredibleEmployment.be  This test is not yet approved or cleared by the [place] FDA and has been authorized for detection and/or diagnosis of SARS-CoV-2 by FDA under an Emergency Use Authorization (EUA). This EUA will remain in effect (meaning this test can be used) for the duration of the COVID-19 declaration under Section 564(b)(1) of the Act, 21 U.S.C. section 360bbb-3(b)(1), unless the authorization is terminated or revoked.  Performed at [facility], [address] [place], [place] [address]      Radiology Studies: DG Chest 2 View  Result Date: 05/07/2021 CLINICAL DATA:  Chest pain. EXAM: CHEST - 2 VIEW COMPARISON:  None. FINDINGS: Mild atelectasis and/or infiltrate is seen within the left lung base. Diffuse, chronic appearing increased lung markings are also seen. There is a small left pleural effusion. No pneumothorax is identified. The heart size and mediastinal contours are within normal limits. The visualized skeletal structures are unremarkable. IMPRESSION: 1. Mild left basilar atelectasis and/or infiltrate. 2. Small left pleural effusion. Electronically Signed   By: Virgina Norfolk M.D.   On: 05/07/2021 16:53     LOS: 0 days   Antonieta Pert, MD [facility]  05/08/2021, 8:31 AM

## 2021-05-09 ENCOUNTER — Inpatient Hospital Stay (HOSPITAL_COMMUNITY): Payer: BLUE CROSS/BLUE SHIELD

## 2021-05-09 DIAGNOSIS — I5041 Acute combined systolic (congestive) and diastolic (congestive) heart failure: Secondary | ICD-10-CM | POA: Diagnosis not present

## 2021-05-09 DIAGNOSIS — I509 Heart failure, unspecified: Secondary | ICD-10-CM

## 2021-05-09 DIAGNOSIS — E785 Hyperlipidemia, unspecified: Secondary | ICD-10-CM | POA: Diagnosis not present

## 2021-05-09 DIAGNOSIS — I10 Essential (primary) hypertension: Secondary | ICD-10-CM

## 2021-05-09 DIAGNOSIS — I5020 Unspecified systolic (congestive) heart failure: Secondary | ICD-10-CM | POA: Diagnosis not present

## 2021-05-09 LAB — CBC
HCT: 29.7 % — ABNORMAL LOW (ref 36.0–46.0)
Hemoglobin: 9.8 g/dL — ABNORMAL LOW (ref 12.0–15.0)
MCH: 28.5 pg (ref 26.0–34.0)
MCHC: 33 g/dL (ref 30.0–36.0)
MCV: 86.3 fL (ref 80.0–100.0)
Platelets: 281 10*3/uL (ref 150–400)
RBC: 3.44 MIL/uL — ABNORMAL LOW (ref 3.87–5.11)
RDW: 14.2 % (ref 11.5–15.5)
WBC: 7.5 10*3/uL (ref 4.0–10.5)
nRBC: 0 % (ref 0.0–0.2)

## 2021-05-09 LAB — RETICULOCYTES
Immature Retic Fract: 7.9 % (ref 2.3–15.9)
RBC.: 3.68 MIL/uL — ABNORMAL LOW (ref 3.87–5.11)
Retic Count, Absolute: 60 10*3/uL (ref 19.0–186.0)
Retic Ct Pct: 1.6 % (ref 0.4–3.1)

## 2021-05-09 LAB — GASTROINTESTINAL PANEL BY PCR, STOOL (REPLACES STOOL CULTURE)

## 2021-05-09 LAB — FOLATE: Folate: 10.2 ng/mL (ref >5.9)

## 2021-05-09 LAB — BASIC METABOLIC PANEL
Anion gap: 6 (ref 5–15)
BUN: 17 mg/dL (ref 6–20)
CO2: 31 mmol/L (ref 22–32)
Calcium: 8 mg/dL — ABNORMAL LOW (ref 8.9–10.3)
Chloride: 100 mmol/L (ref 98–111)
Creatinine, Ser: 0.95 mg/dL (ref 0.44–1.00)
GFR, Estimated: >60 mL/min (ref >60)
Glucose, Bld: 94 mg/dL (ref 70–99)
Potassium: 3.1 mmol/L — ABNORMAL LOW (ref 3.5–5.1)
Sodium: 137 mmol/L (ref 135–145)

## 2021-05-09 LAB — GLUCOSE, CAPILLARY
Glucose-Capillary: 100 mg/dL — ABNORMAL HIGH (ref 70–99)
Glucose-Capillary: 64 mg/dL — ABNORMAL LOW (ref 70–99)
Glucose-Capillary: 168 mg/dL — ABNORMAL HIGH (ref 70–99)
Glucose-Capillary: 64 mg/dL — ABNORMAL LOW (ref 70–99)
Glucose-Capillary: 98 mg/dL (ref 70–99)
Glucose-Capillary: 345 mg/dL — ABNORMAL HIGH (ref 70–99)

## 2021-05-09 LAB — VITAMIN B12: Vitamin B-12: 385 pg/mL (ref 180–914)

## 2021-05-09 LAB — IRON AND TIBC
Iron: 37 ug/dL (ref 28–170)
Saturation Ratios: 16 % (ref 10.4–31.8)
TIBC: 228 ug/dL — ABNORMAL LOW (ref 250–450)
UIBC: 191 ug/dL

## 2021-05-09 LAB — FERRITIN: Ferritin: 77 ng/mL (ref 11–307)

## 2021-05-09 IMAGING — NM NM SCAN TUMOR LOCALIZE WITH SPECT
4 series · 19 of 19 positions shown · non-contrast
Comparison: none

CLINICAL DATA: Acute combined systolic and diastolic congestive
heart failure

EXAM:
NUCLEAR MEDICINE TUMOR LOCALIZATION. PYP CARDIAC AMYLOIDOSIS SCAN
WITH SPECT
TECHNIQUE: Following intravenous administration of radiopharmaceutical,
anterior planar images of the chest were obtained. Regions of
interest were placed on the heart and contralateral chest wall for
quantitative assessment. Additional SPECT imaging of the chest was
obtained.
RADIOPHARMACEUTICALS:  20.5 mCi [1K] PYROPHOSPHATE

[Series 1: anterior · 4.14mm/px · 1 of 1 slices shown]
[im 1/1]
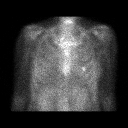

[Series 1: spect - (id)_(id)_cor · 4.1mm · 4.14mm/px · 6 of 128 frames shown]
[frame 11/128]
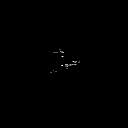
[frame 32/128]
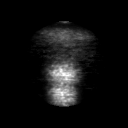
[frame 54/128]
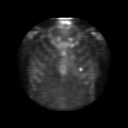
[frame 75/128]
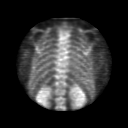
[frame 96/128]
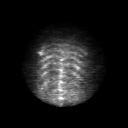
[frame 118/128]
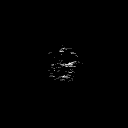

[Series 1: spect - (id)_(id)_tra · 4.1mm · 4.14mm/px · 6 of 128 frames shown]
[frame 11/128]
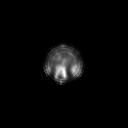
[frame 32/128]
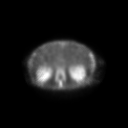
[frame 54/128]
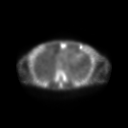
[frame 75/128]
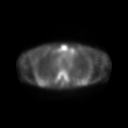
[frame 96/128]
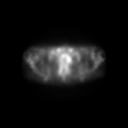
[frame 118/128]
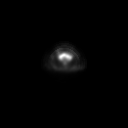

[Series 2: amyloid spect · 4.14mm/px · 6 of 64 frames shown]
[frame 6/64]
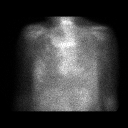
[frame 16/64]
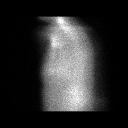
[frame 27/64]
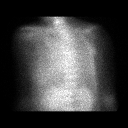
[frame 38/64]
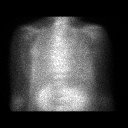
[frame 48/64]
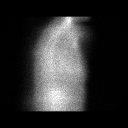
[frame 59/64]
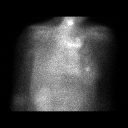

[19 of 19 positions shown; findings below may reference images not displayed]

FINDINGS: Planar Visual assessment:

Anterior planar imaging demonstrates radiotracer uptake within the
heart less than uptake within the adjacent ribs (Grade 1).

Quantitative assessment :

Quantitative assessment of the cardiac uptake compared to the
contralateral chest wall is equal to 1.10 (H/CL = 1.10).

SPECT assessment: SPECT imaging of the chest demonstrates minimal
radiotracer accumulation within the LEFT ventricle.
IMPRESSION: Visual and quantitative assessment (grade 1, H/CLL equal 1.10) are
equivocal for transthyretin amyloidosis.

## 2021-05-09 MED ORDER — POTASSIUM CHLORIDE CRYS ER 20 MEQ PO TBCR
40.0000 meq | EXTENDED_RELEASE_TABLET | Freq: Once | ORAL | Status: AC
  Administered 2021-05-09: 40 meq via ORAL
  Filled 2021-05-09: qty 2

## 2021-05-09 MED ORDER — POTASSIUM CHLORIDE CRYS ER 20 MEQ PO TBCR
20.0000 meq | EXTENDED_RELEASE_TABLET | Freq: Two times a day (BID) | ORAL | Status: DC
  Administered 2021-05-09 – 2021-05-10 (×3): 20 meq via ORAL
  Filled 2021-05-09: qty 1
  Filled 2021-05-09: qty 2
  Filled 2021-05-09: qty 1

## 2021-05-09 MED ORDER — TECHNETIUM TC 99M PYROPHOSPHATE
20.5000 | Freq: Once | INTRAVENOUS | Status: AC | PRN
  Administered 2021-05-09: 20.5 via INTRAVENOUS
  Filled 2021-05-09: qty 21

## 2021-05-09 MED ORDER — INSULIN GLARGINE-YFGN 100 UNIT/ML ~~LOC~~ SOLN
5.0000 [IU] | Freq: Every day | SUBCUTANEOUS | Status: DC
  Administered 2021-05-09: 5 [IU] via SUBCUTANEOUS
  Filled 2021-05-09 (×2): qty 0.05

## 2021-05-09 MED ORDER — TECHNETIUM TC 99M PYROPHOSPHATE
20.0000 | Freq: Once | INTRAVENOUS | Status: DC
  Filled 2021-05-09: qty 20

## 2021-05-09 NOTE — Progress Notes (Signed)
Infection Prevention recommended to put the patient on enteric precautions until the GI panel results. Page to notify [place] [place]. Enteric precautions initiated.

## 2021-05-09 NOTE — Progress Notes (Addendum)
Hypoglycemic Event  CBG: 64  Treatment: 8 oz juice/soda  Symptoms: None  Follow-up CBG: Time:1634 CBG Result:98  Possible Reasons for Event: NPO for cardiac amyloid scan.  Comments/MD notified: Patient did not have symptoms of a blood glucose of 64. RN gave one x 8 ounce grape juice. NT recheck CBG and the result is 98.     Beckey Rutter

## 2021-05-09 NOTE — Progress Notes (Signed)
Overall PROGRESS NOTE    Amber Cross  ENI:[id number] DOB: [birth year] DOA: 05/07/2021 PCP: Jenel Lucks, PA-C   Chief Complaint  Patient presents with   Chest Pain  Brief Narrative/Hospital Course: 60 year old female with diabetes, hypertension, tobacco abuse, HLD presents with chest pain shortness of breath lightest, symptom onset about a week ago.  She has had neck swelling for about 2 months saw cardiologist at antibiotics about a month ago had a stress test with EF 43%, Lasix was increased to 40 mg but never had significant increase in urine output saw her primary care doctor discontinued about 2 weeks ago. Patient reported orthopnea no fever chills abdominal pain. In the ED blood pressure in 110s to 150s, hyperglycemia 451 hemoglobin 10.7 and troponin 24> 23 flat chest x-ray mild left basilar atelectasis plus minus infiltrate with small left pleural effusion, elevated BNP 648 and respiratory virus panel COVID-19 negative.  Patient was given Lasix Claritin hydralazine metoprolol nitro insulin aspirin and admitted w/ consult with cardiology static changes but no STEMI She did not respond to initial 40 mg IV Lasix so increased dose to 80 mg.   Subjective: Seen and examined this morning.  Shortness of breath much better leg edema improving. Overnight potassium low at 3.9 blood sugar 64  Assessment & Plan:  Acute combined systolic and diastolic congestive heart failure:  Presents with shortness of breath leg swelling orthopnea chest pain elevated BNP 648, chest x-ray small pleural effusion.  Echo shows EF 25% G2DD.  Cardiology input appreciated.  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 Wt as per patient 2 months ago in 120 lb/54 kg- on admission 61.7kg- now at 56.2. Monitor strict I/O,daly weight, electrolytes, Cont salt and fluid restricted diet. GDMT: Lopressor switched to Coreg, stopped lisinopril and started on losartan  -can transition to [facility] as outpatient.  Continue plan per cardiology.  Net IO Since Admission: -1,970 mL [05/09/21 1019]  Filed Weights   05/07/21 1608 05/08/21 0001 05/09/21 0419  Weight: 61.7 kg 60.4 kg 56.2 kg    Elevated troponin: Troponin flat likely demand ischemia in the setting of above.  Plan per cardiology on board. No chest pain this am.  Type 2 diabetes mellitus with peripheral neuropathy on long-term insulin with hypoglycemia overnight:last hba1c >14 in 3/22.decrease Lantus to 5 units, continue sliding scale insulin stop Premeal insulin, continue Neurontin  Recent Labs  Lab 05/08/21 1130 05/08/21 1649 05/08/21 2133 05/09/21 0652 05/09/21 0712  GLUCAP 95 160* 187* 64* 100*    Essential hypertension: Poorly controlled on admission.  Borderline control.  Continue losartan, Coreg continue statin medication as above  Hyperlipidemia LDL goal <100-not on statins.  Anemia likely chronic disease, monitor Recent Labs  Lab 05/07/21 1610 05/08/21 0205 05/09/21 0258  HGB 10.7* 9.1* 9.8*  HCT 33.2* 28.1* 29.7*    Hypokalemia: We will replete aggressively. Hypomagnesemia: repleted  Active tobacco use- advised cessation, she declined nicotine patch  DVT prophylaxis: enoxaparin (LOVENOX) injection 40 mg Start: 05/08/21 1000 Code Status:   Code Status: Full Code Family Communication: plan of care discussed with patient at bedside. Status is: Inpatient  Remains hospitalized for ongoing management of leg edema shortness of breath with iv lasix and  concern for acute CHF Dispo: The patient is from: Home with son, ambulatory and independent at baseline              Anticipated d/c is to: Home  Patient currently is not medically stable to d/c.   Difficult to place patient No  Objective: Vitals: Today's Vitals   05/09/21 0419 05/09/21 0749 05/09/21 0800 05/09/21 0909  BP: (!) 162/77 (!) 168/72  (!) 160/72  Pulse: 80 91  81  Resp: 20 17    Temp: 98.5 F  (36.9 C) 98.5 F (36.9 C)    TempSrc: Oral Oral    SpO2: 96% 97%    Weight: 56.2 kg     Height:      PainSc:   0-No pain    Physical Examination: General exam: AAOx 3, older than stated age, weak appearing. HEENT:Oral mucosa moist, Ear/Nose WNL grossly, dentition normal. Respiratory system: bilaterally diminished at the base, no use of accessory muscle Cardiovascular system: S1 & S2 +, + JVD,. Gastrointestinal system: Abdomen soft,NT,ND, BS+ Nervous System:Alert, awake, moving extremities and grossly nonfocal Extremities: Bilateral pitting lower extremity, distal peripheral pulses palpable.  Skin: No rashes,no icterus. MSK: Normal muscle bulk,tone, power   Medications reviewed:  Scheduled Meds:  atorvastatin  80 mg Oral Daily   carvedilol  6.25 mg Oral BID WC   enoxaparin (LOVENOX) injection  40 mg Subcutaneous Q24H   furosemide  80 mg Intravenous BID   gabapentin  300 mg Oral TID   insulin aspart  0-15 Units Subcutaneous TID WC   insulin aspart  0-5 Units Subcutaneous QHS   insulin glargine-yfgn  5 Units Subcutaneous QHS   losartan  50 mg Oral Daily   potassium chloride  20 mEq Oral BID   sodium chloride flush  3 mL Intravenous Q12H   technetium pyrophosphate Tc 59m  20 millicurie Intravenous Once   Continuous Infusions: FEN: Diet Order             Diet NPO time specified  Diet effective now                   Intake/Output  Intake/Output Summary (Last 24 hours) at 05/09/2021 1019 Last data filed at 05/09/2021 0900 Gross per 24 hour  Intake 700 ml  Output 1000 ml  Net -300 ml   Intake/Output from previous day: 10/11 0701 - 10/12 0700 In: 580 [P.O.:580] Out: 400 [Urine:400] Net IO Since Admission: -1,970 mL [05/09/21 1019]   Weight change: -5.443 kg  Wt Readings from Last 3 Encounters:  05/09/21 56.2 kg  10/24/20 49 kg  10/24/20 49.4 kg     Consultants:see note  Procedures:see note Antimicrobials: Anti-infectives (From admission, onward)     None      Culture/Microbiology No results found for: SDES, SPECREQUEST, CULT, REPTSTATUS  Other culture-see note  Unresulted Labs (From admission, onward)     Start     Ordered   05/14/21 0500  Creatinine, serum  (enoxaparin (LOVENOX)    CrCl >/= 30 ml/min)  Weekly,   R     Comments: while on enoxaparin therapy    05/07/21 2356   05/09/21 0835  Immunofixation, urine  Once,   R        05/09/21 0836   05/09/21 0835  Kappa/lambda light chains  Once,   R       Question:  Specimen collection method  Answer:  Lab=Lab collect   05/09/21 0836   05/09/21 0834  Protein electrophoresis, serum  Once,   R       Question:  Specimen collection method  Answer:  Lab=Lab collect   05/09/21 0836   05/09/21 0834  Immunofixation electrophoresis  Once,   R  Question:  Specimen collection method  Answer:  Lab=Lab collect   05/09/21 0836   05/09/21 4270  Basic metabolic panel  Daily,   R     Question:  Specimen collection method  Answer:  Lab=Lab collect   05/08/21 0831   05/09/21 0500  CBC  Daily,   R     Question:  Specimen collection method  Answer:  Lab=Lab collect   05/08/21 0831   05/08/21 1425  Gastrointestinal Panel by PCR , Stool  (Gastrointestinal Panel by PCR, Stool                                                                                                                                                     **Does Not include CLOSTRIDIUM DIFFICILE testing. **If CDIFF testing is needed, place order from the "C Difficile Testing" order set.**)  Once,   R        05/08/21 1424   05/07/21 1610  Pregnancy, urine  Once,   R        05/07/21 1610            Data Reviewed: I have personally reviewed following labs and imaging studies CBC: Recent Labs  Lab 05/07/21 1610 05/08/21 0205 05/09/21 0258  WBC 8.9 8.8 7.5  HGB 10.7* 9.1* 9.8*  HCT 33.2* 28.1* 29.7*  MCV 87.6 87.8 86.3  PLT 323 275 623   Basic Metabolic Panel: Recent Labs  Lab 05/07/21 1610 05/08/21 0205  05/09/21 0258  NA 136 138 137  K 3.4* 3.2* 3.1*  CL 103 104 100  CO2 27 28 31   GLUCOSE 451* 196* 94  BUN 17 18 17   CREATININE 1.16* 1.18* 0.95  CALCIUM 8.1* 7.8* 8.0*  MG  --  1.5*  --    GFR: Estimated Creatinine Clearance: 56.6 mL/min (by C-G formula based on SCr of 0.95 mg/dL). Liver Function Tests: No results for input(s): AST, ALT, ALKPHOS, BILITOT, PROT, ALBUMIN in the last 168 hours. No results for input(s): LIPASE, AMYLASE in the last 168 hours. No results for input(s): AMMONIA in the last 168 hours. Coagulation Profile: No results for input(s): INR, PROTIME in the last 168 hours. Cardiac Enzymes: No results for input(s): CKTOTAL, CKMB, CKMBINDEX, TROPONINI in the last 168 hours. BNP (last 3 results) No results for input(s): PROBNP in the last 8760 hours. HbA1C: No results for input(s): HGBA1C in the last 72 hours. CBG: Recent Labs  Lab 05/08/21 1130 05/08/21 1649 05/08/21 2133 05/09/21 0652 05/09/21 0712  GLUCAP 95 160* 187* 64* 100*   Lipid Profile: No results for input(s): CHOL, HDL, LDLCALC, TRIG, CHOLHDL, LDLDIRECT in the last 72 hours. Thyroid Function Tests: No results for input(s): TSH, T4TOTAL, FREET4, T3FREE, THYROIDAB in the last 72 hours. Anemia Panel: No results for input(s): VITAMINB12, FOLATE, FERRITIN, TIBC, IRON, RETICCTPCT in the last 72  hours. Sepsis Labs: No results for input(s): PROCALCITON, LATICACIDVEN in the last 168 hours.  Recent Results (from the past 240 hour(s))  Resp Panel by RT-PCR (Flu A&B, Covid) Nasopharyngeal Swab     Status: None   Collection Time: 05/07/21  5:44 PM   Specimen: Nasopharyngeal Swab; Nasopharyngeal(NP) swabs in vial transport medium  Result Value Ref Range Status   SARS Coronavirus 2 by RT PCR NEGATIVE NEGATIVE Final    Comment: (NOTE) SARS-CoV-2 target nucleic acids are NOT DETECTED.  The SARS-CoV-2 RNA is generally detectable in upper respiratory specimens during the acute phase of infection. The  lowest concentration of SARS-CoV-2 viral copies this assay can detect is 138 copies/mL. A negative result does not preclude SARS-Cov-2 infection and should not be used as the sole basis for treatment or other patient management decisions. A negative result may occur with  improper specimen collection/handling, submission of specimen other than nasopharyngeal swab, presence of viral mutation(s) within the areas targeted by this assay, and inadequate number of viral copies(<138 copies/mL). A negative result must be combined with clinical observations, patient history, and epidemiological information. The expected result is Negative.  Fact Sheet for Patients:  EntrepreneurPulse.com.au  Fact Sheet for Healthcare Providers:  IncredibleEmployment.be  This test is no t yet approved or cleared by the [place] FDA and  has been authorized for detection and/or diagnosis of SARS-CoV-2 by FDA under an Emergency Use Authorization (EUA). This EUA will remain  in effect (meaning this test can be used) for the duration of the COVID-19 declaration under Section 564(b)(1) of the Act, 21 U.S.C.section 360bbb-3(b)(1), unless the authorization is terminated  or revoked sooner.       Influenza A by PCR NEGATIVE NEGATIVE Final   Influenza B by PCR NEGATIVE NEGATIVE Final    Comment: (NOTE) The Xpert Xpress SARS-CoV-2/FLU/RSV plus assay is intended as an aid in the diagnosis of influenza from Nasopharyngeal swab specimens and should not be used as a sole basis for treatment. Nasal washings and aspirates are unacceptable for Xpert Xpress SARS-CoV-2/FLU/RSV testing.  Fact Sheet for Patients: EntrepreneurPulse.com.au  Fact Sheet for Healthcare Providers: IncredibleEmployment.be  This test is not yet approved or cleared by the [place] FDA and has been authorized for detection and/or diagnosis of SARS-CoV-2 by FDA under  an Emergency Use Authorization (EUA). This EUA will remain in effect (meaning this test can be used) for the duration of the COVID-19 declaration under Section 564(b)(1) of the Act, 21 U.S.C. section 360bbb-3(b)(1), unless the authorization is terminated or revoked.  Performed at [facility], [address], [place], [place] [address]      Radiology Studies: DG Chest 2 View  Result Date: 05/07/2021 CLINICAL DATA:  Chest pain. EXAM: CHEST - 2 VIEW COMPARISON:  None. FINDINGS: Mild atelectasis and/or infiltrate is seen within the left lung base. Diffuse, chronic appearing increased lung markings are also seen. There is a small left pleural effusion. No pneumothorax is identified. The heart size and mediastinal contours are within normal limits. The visualized skeletal structures are unremarkable. IMPRESSION: 1. Mild left basilar atelectasis and/or infiltrate. 2. Small left pleural effusion. Electronically Signed   By: Virgina Norfolk M.D.   On: 05/07/2021 16:53   ECHOCARDIOGRAM COMPLETE  Result Date: 05/08/2021    ECHOCARDIOGRAM REPORT   Patient Name:   [facility] Date of Exam: 05/08/2021 Medical Rec #:  [id number]       Height:       67.0 in Accession #:  [id number]      Weight:       133.2 lb Date of Birth:  Jan 25, 1961       BSA:          1.701 m Patient Age:    57 years        BP:           164/89 mmHg Patient Gender: F               HR:           76 bpm. Exam Location:  Inpatient Procedure: 2D Echo, Cardiac Doppler, Color Doppler and Strain Analysis Indications:    Dyspnea R06.00  History:        Patient has no prior history of Echocardiogram examinations.  Sonographer:    Merrie Roof RDCS Referring Phys: [id number] [place]  1. Left ventricular ejection fraction, by estimation, is 25%. The left ventricle has severely decreased function. The left ventricle demonstrates global hypokinesis. There is severe concentric left ventricular hypertrophy. Left  ventricular diastolic parameters are consistent with Grade II diastolic dysfunction (pseudonormalization). There is relative apical sparing on strain imaging.  2. Moderate pericardial effusion at greatest anterior to the right ventricle; small in all other locations (circumferential effusion). There is lack of full expansion of the RV mid and apex; this could be an early sign on increased pericardial pressure.  IVC is normal in size.  3. The inferior vena cava is normal in size with greater than 50% respiratory variability, suggesting right atrial pressure of 3 mmHg.  4. Left atrial size was severely dilated.  5. Right ventricular systolic function is normal. The right ventricular size is normal. Mildly increased right ventricular wall thickness. There is normal pulmonary artery systolic pressure.  6. Right atrial size was mildly dilated.  7. The mitral valve is normal in structure. Mild to moderate mitral valve regurgitation. No evidence of mitral stenosis.  8. The aortic valve is normal in structure. Aortic valve regurgitation is not visualized. No aortic stenosis is present. Comparison(s): No prior Echocardiogram. Conclusion(s)/Recommendation(s): EF appears worse that presviously evaluated (OSH NM Stress). Constellation of increase thickness, pericardial effusion, strain imaging findings, and atrial enlargement can be seen in cardiac amyloidosis; consider pyrophosphate scan or cardiac MRI if clinically indicated. Consider repeat echocardiogram (limited- effusion) in 1-2 days to evaluate for increase in pericardial pressure or effusion. FINDINGS  Left Ventricle: Left ventricular ejection fraction, by estimation, is 25%. The left ventricle has severely decreased function. The left ventricle demonstrates global hypokinesis. The left ventricular internal cavity size was normal in size. There is severe concentric left ventricular hypertrophy. Left ventricular diastolic parameters are consistent with Grade II diastolic  dysfunction (pseudonormalization). Right Ventricle: The right ventricular size is normal. Mildly increased right ventricular wall thickness. Right ventricular systolic function is normal. There is normal pulmonary artery systolic pressure. The tricuspid regurgitant velocity is 2.31 m/s, and with an assumed right atrial pressure of 8 mmHg, the estimated right ventricular systolic pressure is 59.5 mmHg. Left Atrium: Left atrial size was severely dilated. Right Atrium: Right atrial size was mildly dilated. Pericardium: A moderately sized pericardial effusion is present. The pericardial effusion is circumferential and anterior to the right ventricle. Mitral Valve: The mitral valve is normal in structure. Mild to moderate mitral valve regurgitation. No evidence of mitral valve stenosis. Tricuspid Valve: The tricuspid valve is normal in structure. Tricuspid valve regurgitation is mild. Aortic Valve: The aortic valve is normal in structure. Aortic valve regurgitation is  not visualized. No aortic stenosis is present. Aortic valve mean gradient measures 3.0 mmHg. Aortic valve peak gradient measures 4.6 mmHg. Aortic valve area, by VTI measures 2.21 cm. Pulmonic Valve: The pulmonic valve was normal in structure. Pulmonic valve regurgitation is not visualized. No evidence of pulmonic stenosis. Aorta: The aortic root is normal in size and structure. Venous: The inferior vena cava is normal in size with greater than 50% respiratory variability, suggesting right atrial pressure of 3 mmHg. IAS/Shunts: There is right bowing of the interatrial septum, suggestive of elevated left atrial pressure. The atrial septum is grossly normal.  LEFT VENTRICLE PLAX 2D LVIDd:         4.30 cm      Diastology LVIDs:         4.10 cm      LV e' medial:    5.33 cm/s LV PW:         1.60 cm      LV E/e' medial:  14.0 LV IVS:        1.40 cm      LV e' lateral:   5.22 cm/s LVOT diam:     1.90 cm      LV E/e' lateral: 14.3 LV SV:         48 LV SV Index:    28 LVOT Area:     2.84 cm                              3D Volume EF: LV Volumes (MOD)            3D EF:        38 % LV vol d, MOD A2C: 72.6 ml  LV EDV:       209 ml LV vol d, MOD A4C: 103.0 ml LV ESV:       129 ml LV vol s, MOD A2C: 41.7 ml  LV SV:        80 ml LV vol s, MOD A4C: 65.4 ml LV SV MOD A2C:     30.9 ml LV SV MOD A4C:     103.0 ml LV SV MOD BP:      34.3 ml RIGHT VENTRICLE RV Basal diam:  3.00 cm LEFT ATRIUM              Index        RIGHT ATRIUM           Index LA diam:        4.00 cm  2.35 cm/m   RA Area:     20.30 cm LA Vol (A2C):   104.0 ml 61.13 ml/m  RA Volume:   62.70 ml  36.86 ml/m LA Vol (A4C):   89.6 ml  52.67 ml/m LA Biplane Vol: 100.0 ml 58.78 ml/m  AORTIC VALVE AV Area (Vmax):    1.94 cm AV Area (Vmean):   2.05 cm AV Area (VTI):     2.21 cm AV Vmax:           107.00 cm/s AV Vmean:          80.000 cm/s AV VTI:            0.216 m AV Peak Grad:      4.6 mmHg AV Mean Grad:      3.0 mmHg LVOT Vmax:         73.10 cm/s LVOT Vmean:        57.900  cm/s LVOT VTI:          0.168 m LVOT/AV VTI ratio: 0.78  AORTA Ao Root diam: 3.10 cm MITRAL VALVE                TRICUSPID VALVE MV Area (PHT): 3.05 cm     TR Peak grad:   21.3 mmHg MV Decel Time: 249 msec     TR Vmax:        231.00 cm/s MV E velocity: 74.60 cm/s MV A velocity: 113.00 cm/s  SHUNTS MV E/A ratio:  0.66         Systemic VTI:  0.17 m                             Systemic Diam: 1.90 cm Rudean Haskell MD Electronically signed by Rudean Haskell MD Signature Date/Time: 05/08/2021/3:03:49 PM    Final      LOS: 1 day   Antonieta Pert, MD [facility]  05/09/2021, 10:19 AM

## 2021-05-09 NOTE — Progress Notes (Signed)
Patient's gastro panel resulted. Paged and notified [place]aren [place] about the results. Patient's has been taken off enteric precautions.

## 2021-05-09 NOTE — Progress Notes (Signed)
Progress Note  Patient Name: Amber Cross Date of Encounter: 05/09/2021  Primary Cardiologist: None   Subjective   Patient was seen and examined at her bedside. She offers no complaints at this time. She has some questions about her clinical condition which I was able to answer for her.  Inpatient Medications    Scheduled Meds:  atorvastatin  80 mg Oral Daily   carvedilol  6.25 mg Oral BID WC   enoxaparin (LOVENOX) injection  40 mg Subcutaneous Q24H   furosemide  80 mg Intravenous BID   gabapentin  300 mg Oral TID   insulin aspart  0-15 Units Subcutaneous TID WC   insulin aspart  0-5 Units Subcutaneous QHS   insulin glargine-yfgn  5 Units Subcutaneous QHS   losartan  50 mg Oral Daily   potassium chloride  20 mEq Oral BID   sodium chloride flush  3 mL Intravenous Q12H   technetium pyrophosphate Tc 49m  20 millicurie Intravenous Once   Continuous Infusions:  PRN Meds: acetaminophen **OR** acetaminophen, HYDROcodone-acetaminophen, nitroGLYCERIN, polyethylene glycol, traZODone   Vital Signs    Vitals:   05/08/21 2353 05/09/21 0419 05/09/21 0749 05/09/21 0909  BP: (!) 150/71 (!) 162/77 (!) 168/72 (!) 160/72  Pulse: 83 80 91 81  Resp: 19 20 17    Temp: 99 F (37.2 C) 98.5 F (36.9 C) 98.5 F (36.9 C)   TempSrc: Oral Oral Oral   SpO2: 94% 96% 97%   Weight:  56.2 kg    Height:        Intake/Output Summary (Last 24 hours) at 05/09/2021 1017 Last data filed at 05/09/2021 0900 Gross per 24 hour  Intake 700 ml  Output 1000 ml  Net -300 ml   Filed Weights   05/07/21 1608 05/08/21 0001 05/09/21 0419  Weight: 61.7 kg 60.4 kg 56.2 kg    Telemetry    Sinus rhythm heart rate 60-90 bpm - Personally Reviewed  ECG    None today  Physical Exam   GEN: No acute distress.   Neck: No JVD Cardiac: RRR, no murmurs, rubs, or gallops.  Respiratory: Clear to auscultation bilaterally. GI: Soft, nontender, non-distended  MS:  edema has improved; No deformity. Neuro:   Nonfocal  Psych: Normal affect   Labs    Chemistry Recent Labs  Lab 05/07/21 1610 05/08/21 0205 05/09/21 0258  NA 136 138 137  K 3.4* 3.2* 3.1*  CL 103 104 100  CO2 27 28 31   GLUCOSE 451* 196* 94  BUN 17 18 17   CREATININE 1.16* 1.18* 0.95  CALCIUM 8.1* 7.8* 8.0*  GFRNONAA 54* 53* >60  ANIONGAP 6 6 6      Hematology Recent Labs  Lab 05/07/21 1610 05/08/21 0205 05/09/21 0258  WBC 8.9 8.8 7.5  RBC 3.79* 3.20* 3.44*  HGB 10.7* 9.1* 9.8*  HCT 33.2* 28.1* 29.7*  MCV 87.6 87.8 86.3  MCH 28.2 28.4 28.5  MCHC 32.2 32.4 33.0  RDW 14.3 14.1 14.2  PLT 323 275 281    Cardiac EnzymesNo results for input(s): TROPONINI in the last 168 hours. No results for input(s): TROPIPOC in the last 168 hours.   BNP Recent Labs  Lab 05/07/21 1610  BNP 648.5*     DDimer No results for input(s): DDIMER in the last 168 hours.   Radiology    DG Chest 2 View  Result Date: 05/07/2021 CLINICAL DATA:  Chest pain. EXAM: CHEST - 2 VIEW COMPARISON:  None. FINDINGS: Mild atelectasis and/or infiltrate is seen within the  left lung base. Diffuse, chronic appearing increased lung markings are also seen. There is a small left pleural effusion. No pneumothorax is identified. The heart size and mediastinal contours are within normal limits. The visualized skeletal structures are unremarkable. IMPRESSION: 1. Mild left basilar atelectasis and/or infiltrate. 2. Small left pleural effusion. Electronically Signed   By: Virgina Norfolk M.D.   On: 05/07/2021 16:53   ECHOCARDIOGRAM COMPLETE  Result Date: 05/08/2021    ECHOCARDIOGRAM REPORT   Patient Name:   Amber Cross Date of Exam: 05/08/2021 Medical Rec #:  [id number]       Height:       67.0 in Accession #:    [id number]      Weight:       133.2 lb Date of Birth:  06/27/1961       BSA:          1.701 m Patient Age:    60 years        BP:           164/89 mmHg Patient Gender: F               HR:           76 bpm. Exam Location:  Inpatient Procedure: 2D  Echo, Cardiac Doppler, Color Doppler and Strain Analysis Indications:    Dyspnea R06.00  History:        Patient has no prior history of Echocardiogram examinations.  Sonographer:    Merrie Roof RDCS Referring Phys: [id number] [place]  1. Left ventricular ejection fraction, by estimation, is 25%. The left ventricle has severely decreased function. The left ventricle demonstrates global hypokinesis. There is severe concentric left ventricular hypertrophy. Left ventricular diastolic parameters are consistent with Grade II diastolic dysfunction (pseudonormalization). There is relative apical sparing on strain imaging.  2. Moderate pericardial effusion at greatest anterior to the right ventricle; small in all other locations (circumferential effusion). There is lack of full expansion of the RV mid and apex; this could be an early sign on increased pericardial pressure.  IVC is normal in size.  3. The inferior vena cava is normal in size with greater than 50% respiratory variability, suggesting right atrial pressure of 3 mmHg.  4. Left atrial size was severely dilated.  5. Right ventricular systolic function is normal. The right ventricular size is normal. Mildly increased right ventricular wall thickness. There is normal pulmonary artery systolic pressure.  6. Right atrial size was mildly dilated.  7. The mitral valve is normal in structure. Mild to moderate mitral valve regurgitation. No evidence of mitral stenosis.  8. The aortic valve is normal in structure. Aortic valve regurgitation is not visualized. No aortic stenosis is present. Comparison(s): No prior Echocardiogram. Conclusion(s)/Recommendation(s): EF appears worse that presviously evaluated (OSH NM Stress). Constellation of increase thickness, pericardial effusion, strain imaging findings, and atrial enlargement can be seen in cardiac amyloidosis; consider pyrophosphate scan or cardiac MRI if clinically indicated. Consider repeat  echocardiogram (limited- effusion) in 1-2 days to evaluate for increase in pericardial pressure or effusion. FINDINGS  Left Ventricle: Left ventricular ejection fraction, by estimation, is 25%. The left ventricle has severely decreased function. The left ventricle demonstrates global hypokinesis. The left ventricular internal cavity size was normal in size. There is severe concentric left ventricular hypertrophy. Left ventricular diastolic parameters are consistent with Grade II diastolic dysfunction (pseudonormalization). Right Ventricle: The right ventricular size is normal. Mildly increased right ventricular wall thickness. Right ventricular systolic function is normal.  There is normal pulmonary artery systolic pressure. The tricuspid regurgitant velocity is 2.31 m/s, and with an assumed right atrial pressure of 8 mmHg, the estimated right ventricular systolic pressure is 16.1 mmHg. Left Atrium: Left atrial size was severely dilated. Right Atrium: Right atrial size was mildly dilated. Pericardium: A moderately sized pericardial effusion is present. The pericardial effusion is circumferential and anterior to the right ventricle. Mitral Valve: The mitral valve is normal in structure. Mild to moderate mitral valve regurgitation. No evidence of mitral valve stenosis. Tricuspid Valve: The tricuspid valve is normal in structure. Tricuspid valve regurgitation is mild. Aortic Valve: The aortic valve is normal in structure. Aortic valve regurgitation is not visualized. No aortic stenosis is present. Aortic valve mean gradient measures 3.0 mmHg. Aortic valve peak gradient measures 4.6 mmHg. Aortic valve area, by VTI measures 2.21 cm. Pulmonic Valve: The pulmonic valve was normal in structure. Pulmonic valve regurgitation is not visualized. No evidence of pulmonic stenosis. Aorta: The aortic root is normal in size and structure. Venous: The inferior vena cava is normal in size with greater than 50% respiratory variability,  suggesting right atrial pressure of 3 mmHg. IAS/Shunts: There is right bowing of the interatrial septum, suggestive of elevated left atrial pressure. The atrial septum is grossly normal.  LEFT VENTRICLE PLAX 2D LVIDd:         4.30 cm      Diastology LVIDs:         4.10 cm      LV e' medial:    5.33 cm/s LV PW:         1.60 cm      LV E/e' medial:  14.0 LV IVS:        1.40 cm      LV e' lateral:   5.22 cm/s LVOT diam:     1.90 cm      LV E/e' lateral: 14.3 LV SV:         48 LV SV Index:   28 LVOT Area:     2.84 cm                              3D Volume EF: LV Volumes (MOD)            3D EF:        38 % LV vol d, MOD A2C: 72.6 ml  LV EDV:       209 ml LV vol d, MOD A4C: 103.0 ml LV ESV:       129 ml LV vol s, MOD A2C: 41.7 ml  LV SV:        80 ml LV vol s, MOD A4C: 65.4 ml LV SV MOD A2C:     30.9 ml LV SV MOD A4C:     103.0 ml LV SV MOD BP:      34.3 ml RIGHT VENTRICLE RV Basal diam:  3.00 cm LEFT ATRIUM              Index        RIGHT ATRIUM           Index LA diam:        4.00 cm  2.35 cm/m   RA Area:     20.30 cm LA Vol (A2C):   104.0 ml 61.13 ml/m  RA Volume:   62.70 ml  36.86 ml/m LA Vol (A4C):   89.6 ml  52.67 ml/m LA Biplane  Vol: 100.0 ml 58.78 ml/m  AORTIC VALVE AV Area (Vmax):    1.94 cm AV Area (Vmean):   2.05 cm AV Area (VTI):     2.21 cm AV Vmax:           107.00 cm/s AV Vmean:          80.000 cm/s AV VTI:            0.216 m AV Peak Grad:      4.6 mmHg AV Mean Grad:      3.0 mmHg LVOT Vmax:         73.10 cm/s LVOT Vmean:        57.900 cm/s LVOT VTI:          0.168 m LVOT/AV VTI ratio: 0.78  AORTA Ao Root diam: 3.10 cm MITRAL VALVE                TRICUSPID VALVE MV Area (PHT): 3.05 cm     TR Peak grad:   21.3 mmHg MV Decel Time: 249 msec     TR Vmax:        231.00 cm/s MV E velocity: 74.60 cm/s MV A velocity: 113.00 cm/s  SHUNTS MV E/A ratio:  0.66         Systemic VTI:  0.17 m                             Systemic Diam: 1.90 cm Rudean Haskell MD Electronically signed by Rudean Haskell  MD Signature Date/Time: 05/08/2021/3:03:49 PM    Final     Cardiac Studies   TTE 05/08/2021 IMPRESSIONS   1. Left ventricular ejection fraction, by estimation, is 25%. The left  ventricle has severely decreased function. The left ventricle demonstrates  global hypokinesis. There is severe concentric left ventricular  hypertrophy. Left ventricular diastolic  parameters are consistent with Grade II diastolic dysfunction  (pseudonormalization). There is relative apical sparing on strain imaging.   2. Moderate pericardial effusion at greatest anterior to the right  ventricle; small in all other locations (circumferential effusion). There  is lack of full expansion of the RV mid and apex; this could be an early  sign on increased pericardial pressure.   IVC is normal in size.   3. The inferior vena cava is normal in size with greater than 50%  respiratory variability, suggesting right atrial pressure of 3 mmHg.   4. Left atrial size was severely dilated.   5. Right ventricular systolic function is normal. The right ventricular  size is normal. Mildly increased right ventricular wall thickness. There  is normal pulmonary artery systolic pressure.   6. Right atrial size was mildly dilated.   7. The mitral valve is normal in structure. Mild to moderate mitral valve  regurgitation. No evidence of mitral stenosis.   8. The aortic valve is normal in structure. Aortic valve regurgitation is  not visualized. No aortic stenosis is present.   Comparison(s): No prior Echocardiogram.   Conclusion(s)/Recommendation(s): EF appears worse that presviously  evaluated (OSH NM Stress). Constellation of increase thickness,  pericardial effusion, strain imaging findings, and atrial enlargement can  be seen in cardiac amyloidosis; consider  pyrophosphate scan or cardiac MRI if clinically indicated. Consider repeat  echocardiogram (limited- effusion) in 1-2 days to evaluate for increase in  pericardial  pressure or effusion.   Patient Profile     60 y.o. female hypertension, hyperlipidemia, diabetes and recently diagnosed depressed ejection  fraction has been admitted for heart failure.  Assessment & Plan    Acute exacerbation of systolic heart failure Heart failure with reduced ejection fraction  Pericardial effusion Mild to moderate Mitral regurgitation Hypertension  Diabetes Mellitus Electrolyte abnormalities  Tobacco use  Anemia - suspect iron deficieny  She is responding well to diuretics therapy. Clinical exam and breathing has improved. 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  She also does have history of neuropathy which may also support amyloid. We will get NM PYP scan and also send blood work today.  In the meantime she is on losartan 50 mg daily and  carvedilol 6.25 mg BID. Plan to transition the patient to entresto in the outpatient setting.   Her blood pressure is still above target please increase her carvedilol to 12.5 mg BID.  For now not pursing ischemic evaluation due to recent pharmacologic stress test in 12/2020. 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 If clinical picture change may reconsider further ischemic evaluation.  Her echo also did show moderate pericardial effusion with no evidence of tamponade but RV nit fully expanding - will get limited echo prior to discharge.  Accelerated Hypertension - increase coreg to 12.5 mg BID for better blood pressure control. Continue losartan at 50 mg for now.   Diabetes mellitus - continue management per primary team.  Please keep K above 4 and mag above 2    Tobacco use - Recommended cessation   Chronic anemia - please get iron panel. This appears to be chronic iron def anemia. Does not appear that she has has had any evaluation for this - GI or Heme/on eval?      For questions or updates, please contact [place] Please consult [email or web address] for contact info under Cardiology/STEMI.       Signed, Berniece Salines, DO  05/09/2021, 10:17 AM

## 2021-05-09 NOTE — Plan of Care (Signed)
  Problem: Education: Goal: Knowledge of General Education information will improve Description: Including pain rating scale, medication(s)/side effects and non-pharmacologic comfort measures Outcome: Progressing   Problem: Clinical Measurements: Goal: Ability to maintain clinical measurements within normal limits will improve Outcome: Progressing Goal: Will remain free from infection Outcome: Progressing   

## 2021-05-10 ENCOUNTER — Inpatient Hospital Stay (HOSPITAL_COMMUNITY): Payer: BLUE CROSS/BLUE SHIELD

## 2021-05-10 ENCOUNTER — Other Ambulatory Visit (HOSPITAL_COMMUNITY): Payer: Self-pay

## 2021-05-10 DIAGNOSIS — E785 Hyperlipidemia, unspecified: Secondary | ICD-10-CM | POA: Diagnosis not present

## 2021-05-10 DIAGNOSIS — I5020 Unspecified systolic (congestive) heart failure: Secondary | ICD-10-CM | POA: Diagnosis not present

## 2021-05-10 DIAGNOSIS — I10 Essential (primary) hypertension: Secondary | ICD-10-CM | POA: Diagnosis not present

## 2021-05-10 DIAGNOSIS — I3139 Other pericardial effusion (noninflammatory): Secondary | ICD-10-CM

## 2021-05-10 DIAGNOSIS — I509 Heart failure, unspecified: Secondary | ICD-10-CM | POA: Diagnosis not present

## 2021-05-10 LAB — KAPPA/LAMBDA LIGHT CHAINS
Kappa free light chain: 87.6 mg/L — ABNORMAL HIGH (ref 3.3–19.4)
Kappa, lambda light chain ratio: 3.23 — ABNORMAL HIGH (ref 0.26–1.65)
Lambda free light chains: 27.1 mg/L — ABNORMAL HIGH (ref 5.7–26.3)

## 2021-05-10 LAB — GLUCOSE, CAPILLARY
Glucose-Capillary: 188 mg/dL — ABNORMAL HIGH (ref 70–99)
Glucose-Capillary: 162 mg/dL — ABNORMAL HIGH (ref 70–99)

## 2021-05-10 LAB — BASIC METABOLIC PANEL
Anion gap: 5 (ref 5–15)
BUN: 16 mg/dL (ref 6–20)
CO2: 31 mmol/L (ref 22–32)
Calcium: 8 mg/dL — ABNORMAL LOW (ref 8.9–10.3)
Chloride: 100 mmol/L (ref 98–111)
Creatinine, Ser: 1.05 mg/dL — ABNORMAL HIGH (ref 0.44–1.00)
GFR, Estimated: >60 mL/min (ref >60)
Glucose, Bld: 240 mg/dL — ABNORMAL HIGH (ref 70–99)
Potassium: 3.9 mmol/L (ref 3.5–5.1)
Sodium: 136 mmol/L (ref 135–145)

## 2021-05-10 LAB — CBC
HCT: 30.4 % — ABNORMAL LOW (ref 36.0–46.0)
Hemoglobin: 9.9 g/dL — ABNORMAL LOW (ref 12.0–15.0)
MCH: 28.4 pg (ref 26.0–34.0)
MCHC: 32.6 g/dL (ref 30.0–36.0)
MCV: 87.4 fL (ref 80.0–100.0)
Platelets: 284 10*3/uL (ref 150–400)
RBC: 3.48 MIL/uL — ABNORMAL LOW (ref 3.87–5.11)
RDW: 14.1 % (ref 11.5–15.5)
WBC: 7.3 10*3/uL (ref 4.0–10.5)
nRBC: 0 % (ref 0.0–0.2)

## 2021-05-10 LAB — ECHOCARDIOGRAM LIMITED
Height: 67 in
Weight: 1889.6 oz

## 2021-05-10 LAB — IMMUNOFIXATION, URINE

## 2021-05-10 MED ORDER — CARVEDILOL 12.5 MG PO TABS
12.5000 mg | ORAL_TABLET | Freq: Two times a day (BID) | ORAL | 0 refills | Status: DC
  Filled 2021-05-10: qty 60, 30d supply, fill #0

## 2021-05-10 MED ORDER — COLCHICINE 0.6 MG PO TABS: 0.6000 mg | ORAL_TABLET | Freq: Every day | ORAL | 1 refills | Status: DC

## 2021-05-10 MED ORDER — CANAGLIFLOZIN 100 MG PO TABS: ORAL_TABLET | ORAL | 3 refills | Status: DC

## 2021-05-10 MED ORDER — CARVEDILOL 6.25 MG PO TABS
6.2500 mg | ORAL_TABLET | Freq: Once | ORAL | Status: AC
  Administered 2021-05-10: 6.25 mg via ORAL
  Filled 2021-05-10: qty 1

## 2021-05-10 MED ORDER — COLCHICINE 0.6 MG PO TABS: 0.6000 mg | ORAL_TABLET | Freq: Every day | ORAL | 2 refills | Status: DC

## 2021-05-10 MED ORDER — CARVEDILOL 12.5 MG PO TABS: 12.5000 mg | ORAL_TABLET | Freq: Two times a day (BID) | ORAL | Status: DC

## 2021-05-10 MED ORDER — INSULIN ISOPHANE & REGULAR (HUMAN 70-30)100 UNIT/ML KWIKPEN
6.0000 [IU] | PEN_INJECTOR | Freq: Two times a day (BID) | SUBCUTANEOUS | 0 refills | Status: DC
  Filled 2021-05-10: qty 3.6, 30d supply, fill #0

## 2021-05-10 MED ORDER — SPIRONOLACTONE 12.5 MG HALF TABLET
12.5000 mg | ORAL_TABLET | Freq: Every day | ORAL | Status: DC
  Administered 2021-05-10: 12.5 mg via ORAL
  Filled 2021-05-10: qty 1

## 2021-05-10 MED ORDER — ADULT MULTIVITAMIN W/MINERALS CH
1.0000 | ORAL_TABLET | Freq: Every day | ORAL | Status: DC
  Administered 2021-05-10: 1 via ORAL

## 2021-05-10 MED ORDER — FUROSEMIDE 20 MG PO TABS
40.0000 mg | ORAL_TABLET | Freq: Every day | ORAL | 0 refills | Status: DC
  Filled 2021-05-10: qty 60, 30d supply, fill #0

## 2021-05-10 MED ORDER — LOSARTAN POTASSIUM 50 MG PO TABS
50.0000 mg | ORAL_TABLET | Freq: Every day | ORAL | 0 refills | Status: DC
  Filled 2021-05-10: qty 30, 30d supply, fill #0

## 2021-05-10 MED ORDER — SPIRONOLACTONE 25 MG PO TABS
12.5000 mg | ORAL_TABLET | Freq: Every day | ORAL | 0 refills | Status: DC
  Filled 2021-05-10: qty 15, 30d supply, fill #0

## 2021-05-10 MED ORDER — NOVOLIN 70/30 FLEXPEN RELION (70-30) 100 UNIT/ML ~~LOC~~ SUPN: 6.0000 [IU] | PEN_INJECTOR | Freq: Two times a day (BID) | SUBCUTANEOUS | 0 refills | Status: DC

## 2021-05-10 MED ORDER — FUROSEMIDE 20 MG PO TABS
40.0000 mg | ORAL_TABLET | Freq: Every day | ORAL | Status: DC
Start: 2021-05-10 — End: 2021-05-10

## 2021-05-10 MED ORDER — COLCHICINE 0.6 MG PO TABS
0.6000 mg | ORAL_TABLET | Freq: Every day | ORAL | 2 refills | Status: DC
  Filled 2021-05-10: qty 30, 30d supply, fill #0

## 2021-05-10 NOTE — Progress Notes (Signed)
Amber Cross to be D/C'd Home per MD order.  Discussed with the patient and all questions fully answered.  IV catheter discontinued intact. Site without signs and symptoms of complications. Dressing and pressure applied.  An After Visit Summary was printed and given to the patient. Patient received prescriptions from [place].  D/c education completed with patient/family including follow up instructions, medication list, d/c activities limitations if indicated, with other d/c instructions as indicated by MD - patient able to verbalize understanding, all questions fully answered.   Patient instructed to return to ED, call 911, or call MD for any changes in condition.   Patient escorted via [place], and D/C home via private auto.  Manuella Ghazi 05/10/2021 4:20 PM

## 2021-05-10 NOTE — Plan of Care (Signed)
?  Problem: Clinical Measurements: ?Goal: Respiratory complications will improve ?Outcome: Progressing ?  ?Problem: Elimination: ?Goal: Will not experience complications related to urinary retention ?Outcome: Progressing ?  ?

## 2021-05-10 NOTE — Progress Notes (Addendum)
Initial Nutrition Assessment  DOCUMENTATION CODES:   Underweight  INTERVENTION:   -Magic cup BID with meals, each supplement provides 290 kcal and 9 grams of protein  -MVI with minerals daily -Reached out to diabetes coordinators for further recommendations for glycemic control given persistent hyperglycemia and uncontrolled DM PTA -Provided "Heart Healthy, Consistent Carbohydrate Nutrition Therapy" handout from [place]; attached to AVS/ discharge summary  NUTRITION DIAGNOSIS:   Increased nutrient needs related to chronic illness (CHF) as evidenced by estimated needs.  GOAL:   Patient will meet greater than or equal to 90% of their needs  MONITOR:   PO intake, Supplement acceptance, Labs, Weight trends, Skin, I & O's  REASON FOR ASSESSMENT:   Other (Comment)    ASSESSMENT:   Amber Cross is a 60 y.o. female with medical history significant of diabetes, hypertension, tobacco use, hyperlipidemia presenting with ongoing chest pain shortness of breath and lower extremity edema.  Pt admitted with new onset CHF.   Reviewed I/O's: -2.4 L x 24 hours and -3.8 L since admission  UOP: 3.4 L x 24 hours  Pt unavailable at time of visit. Attempted to speak with pt via call to hospital room phone, however, unable to reach. RD unable to obtain further nutrition-related history or complete nutrition-focused physical exam at this time.    Po 100%  Per RN notes, pt with diarrhea for 1 week PTA. Pt taken off enteric precaution due to negative GI panel.   Noted pt with episode of hypoglycemia last night secondary to being NPO from cardiac amyloid scan. Pt with persistent hyperglycemia, with exception of this one instance, with uncontrolled DM PTA. RD reached out to diabetes coordinators for further assistance/ recommendations.   Reviewed wt hx; wt has been stable over the past year. Noted pt with moderate edema, which may be masking true weight loss as well as fat  and muscle depletions.   Medications reviewed and include lasix and potassium chloride.   Lab Results  Component Value Date   HGBA1C >14.0 (A) 10/24/2020   PTA DM medications are 200 mg canagliflozin daily, 5 units insulin aspart TID with meals, 13 units insulin determir daily, and 100 mg metformin BID.   Labs reviewed: CBGS: 64-345 (inpatient orders for glycemic control are 0-15 units insulin aspart daily TID with meals, 0-5 units insulin aspart daily at bedtime, and 5 units insulin glargine-yfgn daily at bedtime).    Diet Order:   Diet Order             Diet Heart Room service appropriate? Yes; Fluid consistency: Thin  Diet effective now                   EDUCATION NEEDS:   No education needs have been identified at this time  Skin:  Skin Assessment: Reviewed RN Assessment  Last BM:  05/09/21  Height:   Ht Readings from Last 1 Encounters:  05/08/21 5\' 7"  (1.702 m)    Weight:   Wt Readings from Last 1 Encounters:  05/10/21 53.6 kg    Ideal Body Weight:  61.4 kg  BMI:  Body mass index is 18.5 kg/m.  Estimated Nutritional Needs:   Kcal:  1700-1900  Protein:  90-105 grams  Fluid:  > 1.7 L    Loistine Chance, RD, LDN, [place] Registered Dietitian II Certified Diabetes Care and Education Specialist Please refer to [facility] for RD and/or RD on-call/weekend/after hours pager

## 2021-05-10 NOTE — Progress Notes (Signed)
  Echocardiogram 2D Echocardiogram has been performed.  Amber Cross 05/10/2021, 2:17 PM

## 2021-05-10 NOTE — Discharge Instructions (Signed)

## 2021-05-10 NOTE — Progress Notes (Deleted)
Physician Discharge Summary  Amber Cross RCV:[id number] DOB: 1961/07/26 DOA: 05/07/2021  PCP: Jenel Lucks, PA-C  Admit date: 05/07/2021 Discharge date: 05/11/2021  Admitted From:Home Disposition:Home  Recommendations for Outpatient Follow-up:  Follow up with PCP, cardiology in 1-2 weeks Please obtain BMP in one week Please follow up on the following pending results:  Home Health:no  Equipment/Devices: none  Discharge Condition: Stable Code Status:   Code Status: Prior Diet recommendation:  Diet Order     None        Brief/Interim Summary: 60 year old female with diabetes, hypertension, tobacco abuse, HLD presents with chest pain shortness of breath lightest, symptom onset about a week ago.  She has had neck swelling for about 2 months saw cardiologist at antibiotics about a month ago had a stress test with EF 43%, Lasix was increased to 40 mg but never had significant increase in urine output saw her primary care doctor discontinued about 2 weeks ago. Patient reported orthopnea no fever chills abdominal pain. In the ED blood pressure in 110s to 150s, hyperglycemia 451 hemoglobin 10.7 and troponin 24> 23 flat chest x-ray mild left basilar atelectasis plus minus infiltrate with small left pleural effusion, elevated BNP 648 and respiratory virus panel COVID-19 negative.  Patient was given Lasix Claritin hydralazine metoprolol nitro insulin aspirin and admitted w/ consult with cardiology static changes but no STEMI She did not respond to initial 40 mg IV Lasix so increased dose to 80 mg.  Seen by cardiology medication were further adjusted patient was diuresed at this time had significant diuresis which is improved to her baseline about 2 months ago down from 61 to 53.6 kg. Underwent an MPI PET scan equivocal for amyloidosis immunofixation test pending.  Patient will need further outpatient follow-up to rule out infiltrative disease. Leg edema has improved shortness  of breath improved she is at baseline and okay for discharge home on oral Lasix.  She will continue with new medication Coreg, losartan and Aldactone.  Follow-up with outpatient CHF team/cardiology  Discharge Diagnoses:   Acute systolic congestive heart failure: Presented with shortness of breath leg swelling orthopnea chest pain elevated BNP 648, chest x-ray small pleural effusion.  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  Diuresed w/IV Lasix 80 mg bid.GDMT: Lopressor switched to Coreg, stopped lisinopril and started on losartan -can transition to [facility] as outpatient.  Added Aldactone increased Coreg to 12.5 mg for better blood pressure control.appreciate cardiology input.  Okay for discharge home today after limited echo.   Elevated troponin: Troponin flat likely demand ischemia in the setting of above.  Plan per cardiology on board. No chest pain this am.  Moderate pericardial effusion on echo but no tamponade.  Limited echo today prior to discharge and outpatient follow-up- CARDS ADVISED colchicine and lasix 40 mg daily and OP follow up on coming Tuesday.  Type 2 diabetes mellitus with peripheral neuropathy on long-term insulin with hypoglycemia last hba1c >14 in 3/22.  Fu pcp for insulin and dm instruction. [facility] IS NOT TAKING INSULIN CORRECTLY AND dm COORDINATOR SAW HER SWITCHED TO NOVOLIN 6 U BID.  Recent Labs  Lab 05/09/21 1239 05/09/21 1619 05/09/21 1634 05/09/21 2126 05/10/21 0629  GLUCAP 168* 64* 98 345* 188*    Essential hypertension: Poorly controlled, increasing Coreg further, continue losartan, added  Aldactone.  Hyperlipidemia LDL goal <100-not on statins.  Anemia likely chronic disease, anemia panel largely unremarkable  normal ferritin B12 folate  Recent Labs  Lab 05/07/21 1610 05/08/21 0205 05/09/21 0258 05/10/21 0141  HGB 10.7* 9.1* 9.8* 9.9*  HCT 33.2* 28.1* 29.7* 30.4*    Hypokalemia: Resolved.   Hypomagnesemia: repleted  Active tobacco use- advised cessation, she declined nicotine patch  Consults: cardiology  Subjective: Alert awake oriented leg edema has resolved.  No chest pain or shortness of breath.  Feels improved  Discharge Exam: Vitals:   05/10/21 1144 05/10/21 1447  BP: (!) 175/88 139/76  Pulse: 75 79  Resp:  12  Temp:    SpO2:     General: Pt is alert, awake, not in acute distress Cardiovascular: RRR, S1/S2 +, no rubs, no gallops Respiratory: CTA bilaterally, no wheezing, no rhonchi Abdominal: Soft, NT, ND, bowel sounds + Extremities: no edema, no cyanosis  Discharge Instructions  Discharge Instructions     Discharge instructions   Complete by: As directed    You will need to follow-up with cardiology and PCP.  Check BMP in a week.  Please call call MD or return to ER for similar or worsening recurring problem that brought you to hospital or if any fever,nausea/vomiting,abdominal pain, uncontrolled pain, chest pain,  shortness of breath or any other alarming symptoms.  Please follow-up your doctor as instructed in a week time and call the office for appointment.  Please monitor weight daily, avoid strain and stop activity anytime you have chest pain or worsening shortness of breath.Anytime you have any of the following symptoms: 1) 3 pound weight gain in 24 hours or 5 pounds in 1 week 2) shortness of breath, with or without a dry hacking cough 3) swelling in the hands, feet or stomach 4) if you have to sleep on extra pillows at night in order to breathe.    Please avoid alcohol, smoking, or any other illicit substance and maintain healthy habits including taking your regular medications as prescribed.  You were cared for by a hospitalist during your hospital stay.  If you have any questions about your discharge medications or the care you received while you were in the hospital after you are discharged, you can call the unit and ask to speak with the hospitalist on call if the hospitalist that took care of you is not available.  Once you are discharged, your primary care physician will handle any further medical issues. Please note that NO REFILLS for any discharge medications will be authorized once you are discharged, as it is imperative that you return to your primary care physician (or establish a relationship with a primary care physician if you do not have one) for your aftercare needs so that they can reassess your need for medications and monitor your lab values   Increase activity slowly   Complete by: As directed       Allergies as of 05/10/2021       Reactions   Penicillins    itch   Strawberry (diagnostic) Itching   Tomato Itching        Medication List     STOP taking these medications    doxazosin 1 MG tablet Commonly known as: CARDURA   insulin detemir 100 UNIT/ML FlexPen Commonly known as: LEVEMIR   Invokana 100 MG Tabs tablet Generic drug: canagliflozin   Levemir FlexTouch 100 UNIT/ML FlexPen Generic drug: insulin detemir   lisinopril 40 MG tablet Commonly known as: ZESTRIL  metoprolol tartrate 50 MG tablet Commonly known as: LOPRESSOR   NovoLOG FlexPen 100 UNIT/ML FlexPen Generic drug: insulin aspart       TAKE these medications    acetaminophen 500 MG tablet Commonly known as: TYLENOL Take 1,000 mg by mouth every 6 (six) hours as needed for mild pain.   albuterol 108 (90 Base) MCG/ACT inhaler Commonly known as: VENTOLIN HFA Inhale 2 puffs into the lungs every 6 (six) hours as needed for wheezing or shortness of breath.   Bayer Microlet Lancets lancets Check blood sugar up to 3 times a day as instructed   benzonatate 100 MG capsule Commonly known as: TESSALON Take 100 mg by mouth 3 (three) times  daily as needed for cough.   carvedilol 12.5 MG tablet Commonly known as: COREG Take 1 tablet (12.5 mg total) by mouth 2 (two) times daily with a meal.   colchicine 0.6 MG tablet Take 1 tablet (0.6 mg total) by mouth daily.   DULoxetine 60 MG capsule Commonly known as: CYMBALTA Take 1 capsule (60 mg total) by mouth daily.   ezetimibe 10 MG tablet Commonly known as: ZETIA Take 10 mg by mouth daily.   furosemide 20 MG tablet Commonly known as: LASIX Take 2 tablets (40 mg total) by mouth daily. No refills What changed: how much to take   gabapentin 300 MG capsule Commonly known as: Neurontin Take 3 capsules (900 mg total) by mouth 3 (three) times daily. What changed: how much to take   glucose blood test strip Check blood sugar up to 3 times a day as instructed   losartan 50 MG tablet Commonly known as: COZAAR Take 1 tablet (50 mg total) by mouth daily.   NovoLIN 70/30 Kwikpen (70-30) 100 UNIT/ML KwikPen Generic drug: insulin isophane & regular human KwikPen Inject 6 Units into the skin 2 (two) times daily before lunch and supper.   spironolactone 25 MG tablet Commonly known as: ALDACTONE Take 0.5 tablets (12.5 mg total) by mouth daily.   Unifine Pentips 32G X 4 MM Misc Generic drug: Insulin Pen Needle USE TO INJECT INSULIN DAILY        Follow-up Information     Jenel Lucks, PA-C Follow up on 05/18/2021.   Specialty: Internal Medicine Why: The office will call patient Contact information: [place] [address] [phone]         Berniece Salines, DO Follow up.   Specialty: Cardiology Why: Hospital follow-up with Cardiology scheduled for 05/15/2021 at 11:40pm. Please arrive 15 minutes early for check-in. Contact information: [address] [place] [address] [phone]                Allergies  Allergen Reactions   Penicillins     itch   Strawberry (Diagnostic) Itching   Tomato Itching     The results of significant diagnostics from this hospitalization (including imaging, microbiology, ancillary and laboratory) are listed below for reference.    Microbiology: Recent Results (from the past 240 hour(s))  Resp Panel by RT-PCR (Flu A&B, Covid) Nasopharyngeal Swab     Status: None   Collection Time: 05/07/21  5:44 PM   Specimen: Nasopharyngeal Swab; Nasopharyngeal(NP) swabs in vial transport medium  Result Value Ref Range Status   SARS Coronavirus 2 by RT PCR NEGATIVE NEGATIVE Final    Comment: (NOTE) SARS-CoV-2 target nucleic acids are NOT DETECTED.  The SARS-CoV-2 RNA is generally detectable in upper respiratory specimens during the acute phase of infection. The  lowest concentration of SARS-CoV-2 viral copies this assay can detect is 138 copies/mL. A negative result does not preclude SARS-Cov-2 infection and should not be used as the sole basis for treatment or other patient management decisions. A negative result may occur with  improper specimen collection/handling, submission of specimen other than nasopharyngeal swab, presence of viral mutation(s) within the areas targeted by this assay, and inadequate number of viral copies(<138 copies/mL). A negative result must be combined with clinical observations, patient history, and epidemiological information. The expected result is Negative.  Fact Sheet for Patients:  EntrepreneurPulse.com.au  Fact Sheet for Healthcare Providers:  IncredibleEmployment.be  This test is no t yet approved or cleared by the [place] FDA and  has been authorized for detection and/or diagnosis of SARS-CoV-2 by FDA under an Emergency Use Authorization (EUA). This EUA will remain  in effect (meaning this test can be used) for the duration of the COVID-19 declaration under Section 564(b)(1) of the Act, 21 U.S.C.section 360bbb-3(b)(1), unless the authorization is terminated  or revoked sooner.        Influenza A by PCR NEGATIVE NEGATIVE Final   Influenza B by PCR NEGATIVE NEGATIVE Final    Comment: (NOTE) The Xpert Xpress SARS-CoV-2/FLU/RSV plus assay is intended as an aid in the diagnosis of influenza from Nasopharyngeal swab specimens and should not be used as a sole basis for treatment. Nasal washings and aspirates are unacceptable for Xpert Xpress SARS-CoV-2/FLU/RSV testing.  Fact Sheet for Patients: EntrepreneurPulse.com.au  Fact Sheet for Healthcare Providers: IncredibleEmployment.be  This test is not yet approved or cleared by the [place] FDA and has been authorized for detection and/or diagnosis of SARS-CoV-2 by FDA under an Emergency Use Authorization (EUA). This EUA will remain in effect (meaning this test can be used) for the duration of the COVID-19 declaration under Section 564(b)(1) of the Act, 21 U.S.C. section 360bbb-3(b)(1), unless the authorization is terminated or revoked.  Performed at [facility], [address] [place], [place] [address]   Gastrointestinal Panel by PCR , Stool     Status: None   Collection Time: 05/08/21  6:33 PM   Specimen: Stool  Result Value Ref Range Status   Campylobacter species NOT DETECTED NOT DETECTED Final   Plesimonas shigelloides NOT DETECTED NOT DETECTED Final   Salmonella species NOT DETECTED NOT DETECTED Final   Yersinia enterocolitica NOT DETECTED NOT DETECTED Final   Vibrio species NOT DETECTED NOT DETECTED Final   Vibrio cholerae NOT DETECTED NOT DETECTED Final   Enteroaggregative E coli (EAEC) NOT DETECTED NOT DETECTED Final   Enteropathogenic E coli (EPEC) NOT DETECTED NOT DETECTED Final   Enterotoxigenic E coli (ETEC) NOT DETECTED NOT DETECTED Final   Shiga like toxin producing E coli (STEC) NOT DETECTED NOT DETECTED Final   Shigella/Enteroinvasive E coli (EIEC) NOT DETECTED NOT DETECTED Final   Cryptosporidium NOT DETECTED NOT DETECTED Final   Cyclospora  cayetanensis NOT DETECTED NOT DETECTED Final   Entamoeba histolytica NOT DETECTED NOT DETECTED Final   Giardia lamblia NOT DETECTED NOT DETECTED Final   Adenovirus F40/41 NOT DETECTED NOT DETECTED Final   Astrovirus NOT DETECTED NOT DETECTED Final   Norovirus GI/GII NOT DETECTED NOT DETECTED Final   Rotavirus A NOT DETECTED NOT DETECTED Final   Sapovirus (I, II, IV, and V) NOT DETECTED NOT DETECTED Final    Comment: Performed at [facility], [address] [place], [place] [address]    Procedures/Studies: DG Chest 2 View  Result Date: 05/07/2021  CLINICAL DATA:  Chest pain. EXAM: CHEST - 2 VIEW COMPARISON:  None. FINDINGS: Mild atelectasis and/or infiltrate is seen within the left lung base. Diffuse, chronic appearing increased lung markings are also seen. There is a small left pleural effusion. No pneumothorax is identified. The heart size and mediastinal contours are within normal limits. The visualized skeletal structures are unremarkable. IMPRESSION: 1. Mild left basilar atelectasis and/or infiltrate. 2. Small left pleural effusion. Electronically Signed   By: Virgina Norfolk M.D.   On: 05/07/2021 16:53   NM CARDIAC AMYLOID TUMOR LOC INFLAM SPECT 1 DAY  Result Date: 05/09/2021 CLINICAL DATA:  Acute combined systolic and diastolic congestive heart failure EXAM: NUCLEAR MEDICINE TUMOR LOCALIZATION. PYP CARDIAC AMYLOIDOSIS SCAN WITH SPECT TECHNIQUE: Following intravenous administration of radiopharmaceutical, anterior planar images of the chest were obtained. Regions of interest were placed on the heart and contralateral chest wall for quantitative assessment. Additional SPECT imaging of the chest was obtained. RADIOPHARMACEUTICALS:  20.5 mCi Tc-21m PYROPHOSPHATE FINDINGS: Planar Visual assessment: Anterior planar imaging demonstrates radiotracer uptake within the heart less than uptake within the adjacent ribs (Grade 1). Quantitative assessment : Quantitative assessment of the  cardiac uptake compared to the contralateral chest wall is equal to 1.10 (H/CL = 1.10). SPECT assessment: SPECT imaging of the chest demonstrates minimal radiotracer accumulation within the LEFT ventricle. IMPRESSION: Visual and quantitative assessment (grade 1, H/CLL equal 1.10) are equivocal for transthyretin amyloidosis. Electronically Signed   By: Lavonia Dana M.D.   On: 05/09/2021 18:58   ECHOCARDIOGRAM COMPLETE  Result Date: 05/08/2021    ECHOCARDIOGRAM REPORT   Patient Name:   [facility] Date of Exam: 05/08/2021 Medical Rec #:  [id number]       Height:       67.0 in Accession #:    [id number]      Weight:       133.2 lb Date of Birth:  1961-02-20       BSA:          1.701 m Patient Age:    70 years        BP:           164/89 mmHg Patient Gender: F               HR:           76 bpm. Exam Location:  Inpatient Procedure: 2D Echo, Cardiac Doppler, Color Doppler and Strain Analysis Indications:    Dyspnea R06.00  History:        Patient has no prior history of Echocardiogram examinations.  Sonographer:    Merrie Roof RDCS Referring Phys: [id number] [place]  1. Left ventricular ejection fraction, by estimation, is 25%. The left ventricle has severely decreased function. The left ventricle demonstrates global hypokinesis. There is severe concentric left ventricular hypertrophy. Left ventricular diastolic parameters are consistent with Grade II diastolic dysfunction (pseudonormalization). There is relative apical sparing on strain imaging.  2. Moderate pericardial effusion at greatest anterior to the right ventricle; small in all other locations (circumferential effusion). There is lack of full expansion of the RV mid and apex; this could be an early sign on increased pericardial pressure.  IVC is normal in size.  3. The inferior vena cava is normal in size with greater than 50% respiratory variability, suggesting right atrial pressure of 3 mmHg.  4. Left atrial size was severely  dilated.  5. Right ventricular systolic function is normal. The right ventricular size is normal. Mildly increased right  ventricular wall thickness. There is normal pulmonary artery systolic pressure.  6. Right atrial size was mildly dilated.  7. The mitral valve is normal in structure. Mild to moderate mitral valve regurgitation. No evidence of mitral stenosis.  8. The aortic valve is normal in structure. Aortic valve regurgitation is not visualized. No aortic stenosis is present. Comparison(s): No prior Echocardiogram. Conclusion(s)/Recommendation(s): EF appears worse that presviously evaluated (OSH NM Stress). Constellation of increase thickness, pericardial effusion, strain imaging findings, and atrial enlargement can be seen in cardiac amyloidosis; consider pyrophosphate scan or cardiac MRI if clinically indicated. Consider repeat echocardiogram (limited- effusion) in 1-2 days to evaluate for increase in pericardial pressure or effusion. FINDINGS  Left Ventricle: Left ventricular ejection fraction, by estimation, is 25%. The left ventricle has severely decreased function. The left ventricle demonstrates global hypokinesis. The left ventricular internal cavity size was normal in size. There is severe concentric left ventricular hypertrophy. Left ventricular diastolic parameters are consistent with Grade II diastolic dysfunction (pseudonormalization). Right Ventricle: The right ventricular size is normal. Mildly increased right ventricular wall thickness. Right ventricular systolic function is normal. There is normal pulmonary artery systolic pressure. The tricuspid regurgitant velocity is 2.31 m/s, and with an assumed right atrial pressure of 8 mmHg, the estimated right ventricular systolic pressure is 40.9 mmHg. Left Atrium: Left atrial size was severely dilated. Right Atrium: Right atrial size was mildly dilated. Pericardium: A moderately sized pericardial effusion is present. The pericardial effusion is  circumferential and anterior to the right ventricle. Mitral Valve: The mitral valve is normal in structure. Mild to moderate mitral valve regurgitation. No evidence of mitral valve stenosis. Tricuspid Valve: The tricuspid valve is normal in structure. Tricuspid valve regurgitation is mild. Aortic Valve: The aortic valve is normal in structure. Aortic valve regurgitation is not visualized. No aortic stenosis is present. Aortic valve mean gradient measures 3.0 mmHg. Aortic valve peak gradient measures 4.6 mmHg. Aortic valve area, by VTI measures 2.21 cm. Pulmonic Valve: The pulmonic valve was normal in structure. Pulmonic valve regurgitation is not visualized. No evidence of pulmonic stenosis. Aorta: The aortic root is normal in size and structure. Venous: The inferior vena cava is normal in size with greater than 50% respiratory variability, suggesting right atrial pressure of 3 mmHg. IAS/Shunts: There is right bowing of the interatrial septum, suggestive of elevated left atrial pressure. The atrial septum is grossly normal.  LEFT VENTRICLE PLAX 2D LVIDd:         4.30 cm      Diastology LVIDs:         4.10 cm      LV e' medial:    5.33 cm/s LV PW:         1.60 cm      LV E/e' medial:  14.0 LV IVS:        1.40 cm      LV e' lateral:   5.22 cm/s LVOT diam:     1.90 cm      LV E/e' lateral: 14.3 LV SV:         48 LV SV Index:   28 LVOT Area:     2.84 cm                              3D Volume EF: LV Volumes (MOD)            3D EF:        38 % LV  vol d, MOD A2C: 72.6 ml  LV EDV:       209 ml LV vol d, MOD A4C: 103.0 ml LV ESV:       129 ml LV vol s, MOD A2C: 41.7 ml  LV SV:        80 ml LV vol s, MOD A4C: 65.4 ml LV SV MOD A2C:     30.9 ml LV SV MOD A4C:     103.0 ml LV SV MOD BP:      34.3 ml RIGHT VENTRICLE RV Basal diam:  3.00 cm LEFT ATRIUM              Index        RIGHT ATRIUM           Index LA diam:        4.00 cm  2.35 cm/m   RA Area:     20.30 cm LA Vol (A2C):   104.0 ml 61.13 ml/m  RA Volume:   62.70 ml   36.86 ml/m LA Vol (A4C):   89.6 ml  52.67 ml/m LA Biplane Vol: 100.0 ml 58.78 ml/m  AORTIC VALVE AV Area (Vmax):    1.94 cm AV Area (Vmean):   2.05 cm AV Area (VTI):     2.21 cm AV Vmax:           107.00 cm/s AV Vmean:          80.000 cm/s AV VTI:            0.216 m AV Peak Grad:      4.6 mmHg AV Mean Grad:      3.0 mmHg LVOT Vmax:         73.10 cm/s LVOT Vmean:        57.900 cm/s LVOT VTI:          0.168 m LVOT/AV VTI ratio: 0.78  AORTA Ao Root diam: 3.10 cm MITRAL VALVE                TRICUSPID VALVE MV Area (PHT): 3.05 cm     TR Peak grad:   21.3 mmHg MV Decel Time: 249 msec     TR Vmax:        231.00 cm/s MV E velocity: 74.60 cm/s MV A velocity: 113.00 cm/s  SHUNTS MV E/A ratio:  0.66         Systemic VTI:  0.17 m                             Systemic Diam: 1.90 cm Rudean Haskell MD Electronically signed by Rudean Haskell MD Signature Date/Time: 05/08/2021/3:03:49 PM    Final    ECHOCARDIOGRAM LIMITED  Result Date: 05/10/2021    ECHOCARDIOGRAM LIMITED REPORT   Patient Name:   [facility] Date of Exam: 05/10/2021 Medical Rec #:  [id number]       Height:       67.0 in Accession #:    [id number]      Weight:       118.1 lb Date of Birth:  Jul 19, 1961       BSA:          1.617 m Patient Age:    38 years        BP:           175/88 mmHg Patient Gender: F  HR:           84 bpm. Exam Location:  Inpatient Procedure: Limited Echo, Limited Color Doppler and Cardiac Doppler Indications:    pericardial effusion  History:        Patient has prior history of Echocardiogram examinations, most                 recent 05/08/2021. CHF, Signs/Symptoms:Chest Pain; Risk                 Factors:Diabetes, Hypertension and Dyslipidemia.  Sonographer:    Johny Chess RDCS Referring Phys: [id number] [place] IMPRESSIONS  1. Moderate, predominantly anterior pericardial effusion; RA and mild RV collapse; no respiratory variation; IVC normal in size; compared to 05/08/21 LV function improved;  pericardial effusion unchanged.  2. Left ventricular ejection fraction, by estimation, is 40 to 45%. The left ventricle has mildly decreased function. The left ventricle demonstrates global hypokinesis. There is severe left ventricular hypertrophy.  3. Right ventricular systolic function is normal. The right ventricular size is normal. Tricuspid regurgitation signal is inadequate for assessing PA pressure.  4. Left atrial size was moderately dilated.  5. Moderate pericardial effusion.  6. Trivial mitral valve regurgitation.  7. The aortic valve is tricuspid. FINDINGS  Left Ventricle: Left ventricular ejection fraction, by estimation, is 40 to 45%. The left ventricle has mildly decreased function. The left ventricle demonstrates global hypokinesis. The left ventricular internal cavity size was normal in size. There is  severe left ventricular hypertrophy. Right Ventricle: The right ventricular size is normal. Right ventricular systolic function is normal. Tricuspid regurgitation signal is inadequate for assessing PA pressure. The tricuspid regurgitant velocity is 2.66 m/s, and with an assumed right atrial  pressure of 3 mmHg, the estimated right ventricular systolic pressure is 76.7 mmHg. Left Atrium: Left atrial size was moderately dilated. Right Atrium: Right atrial size was normal in size. Pericardium: A moderately sized pericardial effusion is present. Mitral Valve: Trivial mitral valve regurgitation. Tricuspid Valve: Tricuspid valve regurgitation is trivial. Aortic Valve: The aortic valve is tricuspid. Pulmonic Valve: The pulmonic valve was normal in structure. IAS/Shunts: No atrial level shunt detected by color flow Doppler. Additional Comments: Moderate, predominantly anterior pericardial effusion; RA and mild RV collapse; no respiratory variation; IVC normal in size; compared to 05/08/21 LV function improved; pericardial effusion unchanged. IVC IVC diam: 1.30 cm TRICUSPID VALVE TR Peak grad:   28.3 mmHg TR  Vmax:        266.00 cm/s Kirk Ruths MD Electronically signed by Kirk Ruths MD Signature Date/Time: 05/10/2021/2:46:20 PM    Final     Labs: BNP (last 3 results) Recent Labs    05/07/21 1610  BNP 341.9*   Basic Metabolic Panel: Recent Labs  Lab 05/07/21 1610 05/08/21 0205 05/09/21 0258 05/10/21 0141  NA 136 138 137 136  K 3.4* 3.2* 3.1* 3.9  CL 103 104 100 100  CO2 27 28 31 31   GLUCOSE 451* 196* 94 240*  BUN 17 18 17 16   CREATININE 1.16* 1.18* 0.95 1.05*  CALCIUM 8.1* 7.8* 8.0* 8.0*  MG  --  1.5*  --   --    Liver Function Tests: No results for input(s): AST, ALT, ALKPHOS, BILITOT, PROT, ALBUMIN in the last 168 hours. No results for input(s): LIPASE, AMYLASE in the last 168 hours. No results for input(s): AMMONIA in the last 168 hours. CBC: Recent Labs  Lab 05/07/21 1610 05/08/21 0205 05/09/21 0258 05/10/21 0141  WBC 8.9 8.8 7.5 7.3  HGB 10.7* 9.1* 9.8* 9.9*  HCT 33.2* 28.1* 29.7* 30.4*  MCV 87.6 87.8 86.3 87.4  PLT 323 275 281 284   Cardiac Enzymes: No results for input(s): CKTOTAL, CKMB, CKMBINDEX, TROPONINI in the last 168 hours. BNP: Invalid input(s): POCBNP CBG: Recent Labs  Lab 05/09/21 1619 05/09/21 1634 05/09/21 2126 05/10/21 0629 05/10/21 1139  GLUCAP 64* 98 345* 188* 162*   D-Dimer No results for input(s): DDIMER in the last 72 hours. Hgb A1c No results for input(s): HGBA1C in the last 72 hours. Lipid Profile No results for input(s): CHOL, HDL, LDLCALC, TRIG, CHOLHDL, LDLDIRECT in the last 72 hours. Thyroid function studies No results for input(s): TSH, T4TOTAL, T3FREE, THYROIDAB in the last 72 hours.  Invalid input(s): FREET3 Anemia work up Recent Labs    05/09/21 1121  VITAMINB12 385  FOLATE 10.2  FERRITIN 77  TIBC 228*  IRON 37  RETICCTPCT 1.6   Urinalysis    Component Value Date/Time   COLORURINE YELLOW 01/16/2016 1510   APPEARANCEUR CLOUDY (A) 01/16/2016 1510   LABSPEC >1.046 (H) 01/16/2016 1510   PHURINE 5.5  01/16/2016 1510   GLUCOSEU >1000 (A) 01/16/2016 1510   HGBUR SMALL (A) 01/16/2016 1510   BILIRUBINUR NEGATIVE 01/16/2016 1510   KETONESUR NEGATIVE 01/16/2016 1510   PROTEINUR NEGATIVE 01/16/2016 1510   UROBILINOGEN 0.2 09/29/2011 2007   NITRITE POSITIVE (A) 01/16/2016 1510   LEUKOCYTESUR NEGATIVE 01/16/2016 1510   Sepsis Labs Invalid input(s): PROCALCITONIN,  WBC,  LACTICIDVEN Microbiology Recent Results (from the past 240 hour(s))  Resp Panel by RT-PCR (Flu A&B, Covid) Nasopharyngeal Swab     Status: None   Collection Time: 05/07/21  5:44 PM   Specimen: Nasopharyngeal Swab; Nasopharyngeal(NP) swabs in vial transport medium  Result Value Ref Range Status   SARS Coronavirus 2 by RT PCR NEGATIVE NEGATIVE Final    Comment: (NOTE) SARS-CoV-2 target nucleic acids are NOT DETECTED.  The SARS-CoV-2 RNA is generally detectable in upper respiratory specimens during the acute phase of infection. The lowest concentration of SARS-CoV-2 viral copies this assay can detect is 138 copies/mL. A negative result does not preclude SARS-Cov-2 infection and should not be used as the sole basis for treatment or other patient management decisions. A negative result may occur with  improper specimen collection/handling, submission of specimen other than nasopharyngeal swab, presence of viral mutation(s) within the areas targeted by this assay, and inadequate number of viral copies(<138 copies/mL). A negative result must be combined with clinical observations, patient history, and epidemiological information. The expected result is Negative.  Fact Sheet for Patients:  EntrepreneurPulse.com.au  Fact Sheet for Healthcare Providers:  IncredibleEmployment.be  This test is no t yet approved or cleared by the [place] FDA and  has been authorized for detection and/or diagnosis of SARS-CoV-2 by FDA under an Emergency Use Authorization (EUA). This EUA will remain  in  effect (meaning this test can be used) for the duration of the COVID-19 declaration under Section 564(b)(1) of the Act, 21 U.S.C.section 360bbb-3(b)(1), unless the authorization is terminated  or revoked sooner.       Influenza A by PCR NEGATIVE NEGATIVE Final   Influenza B by PCR NEGATIVE NEGATIVE Final    Comment: (NOTE) The Xpert Xpress SARS-CoV-2/FLU/RSV plus assay is intended as an aid in the diagnosis of influenza from Nasopharyngeal swab specimens and should not be used as a sole basis for treatment. Nasal washings and aspirates are unacceptable for Xpert Xpress SARS-CoV-2/FLU/RSV testing.  Fact Sheet for Patients: EntrepreneurPulse.com.au  Fact  Sheet for Healthcare Providers: IncredibleEmployment.be  This test is not yet approved or cleared by the [place] and has been authorized for detection and/or diagnosis of SARS-CoV-2 by FDA under an Emergency Use Authorization (EUA). This EUA will remain in effect (meaning this test can be used) for the duration of the COVID-19 declaration under Section 564(b)(1) of the Act, 21 U.S.C. section 360bbb-3(b)(1), unless the authorization is terminated or revoked.  Performed at [facility], [address] [place], [place] [address]   Gastrointestinal Panel by PCR , Stool     Status: None   Collection Time: 05/08/21  6:33 PM   Specimen: Stool  Result Value Ref Range Status   Campylobacter species NOT DETECTED NOT DETECTED Final   Plesimonas shigelloides NOT DETECTED NOT DETECTED Final   Salmonella species NOT DETECTED NOT DETECTED Final   Yersinia enterocolitica NOT DETECTED NOT DETECTED Final   Vibrio species NOT DETECTED NOT DETECTED Final   Vibrio cholerae NOT DETECTED NOT DETECTED Final   Enteroaggregative E coli (EAEC) NOT DETECTED NOT DETECTED Final   Enteropathogenic E coli (EPEC) NOT DETECTED NOT DETECTED Final   Enterotoxigenic E coli (ETEC) NOT DETECTED NOT  DETECTED Final   Shiga like toxin producing E coli (STEC) NOT DETECTED NOT DETECTED Final   Shigella/Enteroinvasive E coli (EIEC) NOT DETECTED NOT DETECTED Final   Cryptosporidium NOT DETECTED NOT DETECTED Final   Cyclospora cayetanensis NOT DETECTED NOT DETECTED Final   Entamoeba histolytica NOT DETECTED NOT DETECTED Final   Giardia lamblia NOT DETECTED NOT DETECTED Final   Adenovirus F40/41 NOT DETECTED NOT DETECTED Final   Astrovirus NOT DETECTED NOT DETECTED Final   Norovirus GI/GII NOT DETECTED NOT DETECTED Final   Rotavirus A NOT DETECTED NOT DETECTED Final   Sapovirus (I, II, IV, and V) NOT DETECTED NOT DETECTED Final    Comment: Performed at [facility], [address] [place], [place] [address]  Time coordinating discharge:35 minutes  SIGNED: Antonieta Pert, MD  [facility] 05/11/2021, 11:50 AM  If 7PM-7AM, please contact night-coverage [email or web address]

## 2021-05-10 NOTE — Progress Notes (Addendum)
Inpatient Diabetes Program Recommendations  AACE/ADA: New Consensus Statement on Inpatient Glycemic Control (2015)  Target Ranges:  Prepandial:   less than 140 mg/dL      Peak postprandial:   less than 180 mg/dL (1-2 hours)      Critically ill patients:  140 - 180 mg/dL   Lab Results  Component Value Date   GLUCAP 188 (H) 05/10/2021   HGBA1C >14.0 (A) 10/24/2020    Review of Glycemic Control Results for Amber Cross, Amber Cross (MRN [id number]) as of 05/10/2021 09:18  Ref. Range 05/09/2021 16:19 05/09/2021 16:34 05/09/2021 21:26 05/10/2021 06:29  Glucose-Capillary Latest Ref Range: 70 - 99 mg/dL 64 (L) 98 345 (H) 188 (H)   Diabetes history: Type 2 DM Outpatient Diabetes medications: Levemir 17 units QD, Novolog 5-12 units TID, Invokana 200 mg QD Current orders for Inpatient glycemic control: Novolog 0-15 units TID & HS, Semglee 5 units QHS  Inpatient Diabetes Program Recommendations:    Last a1c 12.7% on 03/12/21.  Noted hypoglycemia of 64 mg/dL following Novolog correction. Consider decreasing correction to Novolog 0-6 units TID and adding Novolog 2 units TID (Assuming patient consuming >50% of meals). Will plan to see patient.   Addendum: Spoke with patient regarding outpatient diabetes management. Patient admits to not taking Novolin with meals because "of all the injections" and reports trying to gain weight. States, "there has got to be a better way to manage this; I miss my doses alot."  Reviewed patient's current A1c of 12.7%. Explained what a A1c is and what it measures. Also reviewed goal A1c with patient, importance of good glucose control @ home, and blood sugar goals. Reviewed patho of DM, need for insulin, role of pancreas, impact on heart with poor glycemic control, vascular changes and commorbidities. Patient has a meter and supplies but admits to checking infrequently. Denies symptoms of hypoglycemia. Discussed the frequency and when to call MD.  Did discuss Novolin products  like 70/30 at [facility], cost associated and when to take. Patient feels that this would be so much more affordable and easier to incorporate a routine. Patient to schedule outpatient PCP appointment. At discharge, consider Novolin 70/30 6 units BID.  Secure chat sent to MD. Could add dose tonight if appropriate.  Thanks, Bronson Curb, MSN, RNC-OB Diabetes Coordinator [phone] (8a-5p)

## 2021-05-10 NOTE — Progress Notes (Addendum)
Progress Note  Patient Name: Amber Cross Date of Encounter: 05/10/2021  Primary Cardiologist: None   Subjective   Feels she is breathing at baseline, wants to know when she can go home.  Inpatient Medications    Scheduled Meds:  atorvastatin  80 mg Oral Daily   carvedilol  6.25 mg Oral BID WC   enoxaparin (LOVENOX) injection  40 mg Subcutaneous Q24H   furosemide  80 mg Intravenous BID   gabapentin  300 mg Oral TID   insulin aspart  0-15 Units Subcutaneous TID WC   insulin aspart  0-5 Units Subcutaneous QHS   insulin glargine-yfgn  5 Units Subcutaneous QHS   losartan  50 mg Oral Daily   potassium chloride  20 mEq Oral BID   sodium chloride flush  3 mL Intravenous Q12H   technetium pyrophosphate Tc 1m  20 millicurie Intravenous Once   Continuous Infusions:  PRN Meds: acetaminophen **OR** acetaminophen, HYDROcodone-acetaminophen, nitroGLYCERIN, polyethylene glycol, traZODone   Vital Signs    Vitals:   05/09/21 1027 05/09/21 1637 05/09/21 1954 05/10/21 0435  BP: (!) 163/75 (!) 184/87 (!) 175/78 (!) 174/81  Pulse: 82 88 86 81  Resp:   18 18  Temp: 98.9 F (37.2 C) 98.8 F (37.1 C) 99.3 F (37.4 C) 98 F (36.7 C)  TempSrc: Oral Oral Oral Oral  SpO2: 95% 98% 95% 96%  Weight:    53.6 kg  Height:        Intake/Output Summary (Last 24 hours) at 05/10/2021 0836 Last data filed at 05/10/2021 0436 Gross per 24 hour  Intake 637 ml  Output 3400 ml  Net -2763 ml   Filed Weights   05/08/21 0001 05/09/21 0419 05/10/21 0435  Weight: 60.4 kg 56.2 kg 53.6 kg    Telemetry    Sinus rhythm heart rate 60-90 bpm - Personally Reviewed  ECG    None today  Physical Exam   General: Well developed, well nourished, female in no acute distress Head: Eyes PERRLA, Head normocephalic and atraumatic Lungs: clear bilaterally to auscultation. Heart: HRRR S1 S2, without rub or gallop. No murmur. 4/4 extremity pulses are 2+ & equal. No JVD. Abdomen: Bowel sounds are  present, abdomen soft and non-tender without masses or  hernias noted. Msk: Normal strength and tone for age. Extremities: No clubbing, cyanosis or edema.    Skin:  No rashes or lesions noted. Neuro: Alert and oriented X 3. Psych:  Good affect, responds appropriately  Labs    Chemistry Recent Labs  Lab 05/08/21 0205 05/09/21 0258 05/10/21 0141  NA 138 137 136  K 3.2* 3.1* 3.9  CL 104 100 100  CO2 28 31 31   GLUCOSE 196* 94 240*  BUN 18 17 16   CREATININE 1.18* 0.95 1.05*  CALCIUM 7.8* 8.0* 8.0*  GFRNONAA 53* >60 >60  ANIONGAP 6 6 5      Hematology Recent Labs  Lab 05/08/21 0205 05/09/21 0258 05/09/21 1121 05/10/21 0141  WBC 8.8 7.5  --  7.3  RBC 3.20* 3.44* 3.68* 3.48*  HGB 9.1* 9.8*  --  9.9*  HCT 28.1* 29.7*  --  30.4*  MCV 87.8 86.3  --  87.4  MCH 28.4 28.5  --  28.4  MCHC 32.4 33.0  --  32.6  RDW 14.1 14.2  --  14.1  PLT 275 281  --  284    Cardiac EnzymesNo results for input(s): TROPONINI in the last 168 hours. No results for input(s): TROPIPOC in the last 168 hours.  BNP Recent Labs  Lab 05/07/21 1610  BNP 648.5*    Lab Results  Component Value Date   IRON 37 05/09/2021   TIBC 228 (L) 05/09/2021   FERRITIN 77 05/09/2021    DDimer No results for input(s): DDIMER in the last 168 hours.   Radiology    NM CARDIAC AMYLOID TUMOR LOC INFLAM SPECT 1 DAY  Result Date: 05/09/2021 CLINICAL DATA:  Acute combined systolic and diastolic congestive heart failure EXAM: NUCLEAR MEDICINE TUMOR LOCALIZATION. PYP CARDIAC AMYLOIDOSIS SCAN WITH SPECT TECHNIQUE: Following intravenous administration of radiopharmaceutical, anterior planar images of the chest were obtained. Regions of interest were placed on the heart and contralateral chest wall for quantitative assessment. Additional SPECT imaging of the chest was obtained. RADIOPHARMACEUTICALS:  20.5 mCi Tc-60m PYROPHOSPHATE FINDINGS: Planar Visual assessment: Anterior planar imaging demonstrates radiotracer uptake  within the heart less than uptake within the adjacent ribs (Grade 1). Quantitative assessment : Quantitative assessment of the cardiac uptake compared to the contralateral chest wall is equal to 1.10 (H/CL = 1.10). SPECT assessment: SPECT imaging of the chest demonstrates minimal radiotracer accumulation within the LEFT ventricle. IMPRESSION: Visual and quantitative assessment (grade 1, H/CLL equal 1.10) are equivocal for transthyretin amyloidosis. Electronically Signed   By: Lavonia Dana M.D.   On: 05/09/2021 18:58   ECHOCARDIOGRAM COMPLETE  Result Date: 05/08/2021    ECHOCARDIOGRAM REPORT   Patient Name:   Amber Cross Date of Exam: 05/08/2021 Medical Rec #:  [id number]       Height:       67.0 in Accession #:    [id number]      Weight:       133.2 lb Date of Birth:  01-Aug-1960       BSA:          1.701 m Patient Age:    60 years        BP:           164/89 mmHg Patient Gender: F               HR:           76 bpm. Exam Location:  Inpatient Procedure: 2D Echo, Cardiac Doppler, Color Doppler and Strain Analysis Indications:    Dyspnea R06.00  History:        Patient has no prior history of Echocardiogram examinations.  Sonographer:    Merrie Roof RDCS Referring Phys: [id number] Tina  1. Left ventricular ejection fraction, by estimation, is 25%. The left ventricle has severely decreased function. The left ventricle demonstrates global hypokinesis. There is severe concentric left ventricular hypertrophy. Left ventricular diastolic parameters are consistent with Grade II diastolic dysfunction (pseudonormalization). There is relative apical sparing on strain imaging.  2. Moderate pericardial effusion at greatest anterior to the right ventricle; small in all other locations (circumferential effusion). There is lack of full expansion of the RV mid and apex; this could be an early sign on increased pericardial pressure.  IVC is normal in size.  3. The inferior vena cava is normal in size with  greater than 50% respiratory variability, suggesting right atrial pressure of 3 mmHg.  4. Left atrial size was severely dilated.  5. Right ventricular systolic function is normal. The right ventricular size is normal. Mildly increased right ventricular wall thickness. There is normal pulmonary artery systolic pressure.  6. Right atrial size was mildly dilated.  7. The mitral valve is normal in structure. Mild to moderate mitral valve regurgitation. No evidence  of mitral stenosis.  8. The aortic valve is normal in structure. Aortic valve regurgitation is not visualized. No aortic stenosis is present. Comparison(s): No prior Echocardiogram. Conclusion(s)/Recommendation(s): EF appears worse that presviously evaluated (OSH NM Stress). Constellation of increase thickness, pericardial effusion, strain imaging findings, and atrial enlargement can be seen in cardiac amyloidosis; consider pyrophosphate scan or cardiac MRI if clinically indicated. Consider repeat echocardiogram (limited- effusion) in 1-2 days to evaluate for increase in pericardial pressure or effusion. FINDINGS  Left Ventricle: Left ventricular ejection fraction, by estimation, is 25%. The left ventricle has severely decreased function. The left ventricle demonstrates global hypokinesis. The left ventricular internal cavity size was normal in size. There is severe concentric left ventricular hypertrophy. Left ventricular diastolic parameters are consistent with Grade II diastolic dysfunction (pseudonormalization). Right Ventricle: The right ventricular size is normal. Mildly increased right ventricular wall thickness. Right ventricular systolic function is normal. There is normal pulmonary artery systolic pressure. The tricuspid regurgitant velocity is 2.31 m/s, and with an assumed right atrial pressure of 8 mmHg, the estimated right ventricular systolic pressure is 98.3 mmHg. Left Atrium: Left atrial size was severely dilated. Right Atrium: Right atrial  size was mildly dilated. Pericardium: A moderately sized pericardial effusion is present. The pericardial effusion is circumferential and anterior to the right ventricle. Mitral Valve: The mitral valve is normal in structure. Mild to moderate mitral valve regurgitation. No evidence of mitral valve stenosis. Tricuspid Valve: The tricuspid valve is normal in structure. Tricuspid valve regurgitation is mild. Aortic Valve: The aortic valve is normal in structure. Aortic valve regurgitation is not visualized. No aortic stenosis is present. Aortic valve mean gradient measures 3.0 mmHg. Aortic valve peak gradient measures 4.6 mmHg. Aortic valve area, by VTI measures 2.21 cm. Pulmonic Valve: The pulmonic valve was normal in structure. Pulmonic valve regurgitation is not visualized. No evidence of pulmonic stenosis. Aorta: The aortic root is normal in size and structure. Venous: The inferior vena cava is normal in size with greater than 50% respiratory variability, suggesting right atrial pressure of 3 mmHg. IAS/Shunts: There is right bowing of the interatrial septum, suggestive of elevated left atrial pressure. The atrial septum is grossly normal.  LEFT VENTRICLE PLAX 2D LVIDd:         4.30 cm      Diastology LVIDs:         4.10 cm      LV e' medial:    5.33 cm/s LV PW:         1.60 cm      LV E/e' medial:  14.0 LV IVS:        1.40 cm      LV e' lateral:   5.22 cm/s LVOT diam:     1.90 cm      LV E/e' lateral: 14.3 LV SV:         48 LV SV Index:   28 LVOT Area:     2.84 cm                              3D Volume EF: LV Volumes (MOD)            3D EF:        38 % LV vol d, MOD A2C: 72.6 ml  LV EDV:       209 ml LV vol d, MOD A4C: 103.0 ml LV ESV:       129 ml LV  vol s, MOD A2C: 41.7 ml  LV SV:        80 ml LV vol s, MOD A4C: 65.4 ml LV SV MOD A2C:     30.9 ml LV SV MOD A4C:     103.0 ml LV SV MOD BP:      34.3 ml RIGHT VENTRICLE RV Basal diam:  3.00 cm LEFT ATRIUM              Index        RIGHT ATRIUM           Index LA  diam:        4.00 cm  2.35 cm/m   RA Area:     20.30 cm LA Vol (A2C):   104.0 ml 61.13 ml/m  RA Volume:   62.70 ml  36.86 ml/m LA Vol (A4C):   89.6 ml  52.67 ml/m LA Biplane Vol: 100.0 ml 58.78 ml/m  AORTIC VALVE AV Area (Vmax):    1.94 cm AV Area (Vmean):   2.05 cm AV Area (VTI):     2.21 cm AV Vmax:           107.00 cm/s AV Vmean:          80.000 cm/s AV VTI:            0.216 m AV Peak Grad:      4.6 mmHg AV Mean Grad:      3.0 mmHg LVOT Vmax:         73.10 cm/s LVOT Vmean:        57.900 cm/s LVOT VTI:          0.168 m LVOT/AV VTI ratio: 0.78  AORTA Ao Root diam: 3.10 cm MITRAL VALVE                TRICUSPID VALVE MV Area (PHT): 3.05 cm     TR Peak grad:   21.3 mmHg MV Decel Time: 249 msec     TR Vmax:        231.00 cm/s MV E velocity: 74.60 cm/s MV A velocity: 113.00 cm/s  SHUNTS MV E/A ratio:  0.66         Systemic VTI:  0.17 m                             Systemic Diam: 1.90 cm Rudean Haskell MD Electronically signed by Rudean Haskell MD Signature Date/Time: 05/08/2021/3:03:49 PM    Final     Cardiac Studies   TTE 05/08/2021 IMPRESSIONS   1. Left ventricular ejection fraction, by estimation, is 25%. The left  ventricle has severely decreased function. The left ventricle demonstrates  global hypokinesis. There is severe concentric left ventricular  hypertrophy. Left ventricular diastolic  parameters are consistent with Grade II diastolic dysfunction  (pseudonormalization). There is relative apical sparing on strain imaging.   2. Moderate pericardial effusion at greatest anterior to the right  ventricle; small in all other locations (circumferential effusion). There  is lack of full expansion of the RV mid and apex; this could be an early  sign on increased pericardial pressure.   IVC is normal in size.   3. The inferior vena cava is normal in size with greater than 50%  respiratory variability, suggesting right atrial pressure of 3 mmHg.   4. Left atrial size was severely  dilated.   5. Right ventricular systolic function is normal. The right ventricular  size is  normal. Mildly increased right ventricular wall thickness. There  is normal pulmonary artery systolic pressure.   6. Right atrial size was mildly dilated.   7. The mitral valve is normal in structure. Mild to moderate mitral valve  regurgitation. No evidence of mitral stenosis.   8. The aortic valve is normal in structure. Aortic valve regurgitation is  not visualized. No aortic stenosis is present.   Comparison(s): No prior Echocardiogram.   Conclusion(s)/Recommendation(s): EF appears worse that presviously  evaluated (OSH NM Stress). Constellation of increase thickness,  pericardial effusion, strain imaging findings, and atrial enlargement can  be seen in cardiac amyloidosis; consider  pyrophosphate scan or cardiac MRI if clinically indicated. Consider repeat  echocardiogram (limited- effusion) in 1-2 days to evaluate for increase in  pericardial pressure or effusion.   NM CARDIAC AMYLOID TUMOR LOC INFLAM SPECT 1 DAY 05/09/2021 FINDINGS: Planar Visual assessment:   Anterior planar imaging demonstrates radiotracer uptake within the heart less than uptake within the adjacent ribs (Grade 1).   Quantitative assessment :   Quantitative assessment of the cardiac uptake compared to the contralateral chest wall is equal to 1.10 (H/CL = 1.10).   SPECT assessment: SPECT imaging of the chest demonstrates minimal radiotracer accumulation within the LEFT ventricle.   IMPRESSION: Visual and quantitative assessment (grade 1, H/CLL equal 1.10) are equivocal for transthyretin amyloidosis.  Urine immunofixation, kappa/lambda light chains, protein electrophoresis and immunofixation electrophoresis are in process.   Patient Profile     60 y.o. female hypertension, hyperlipidemia, diabetes and recently diagnosed depressed ejection fraction has been admitted for heart failure.  Assessment & Plan     Acute exacerbation of systolic heart failure Heart failure with reduced ejection fraction  Pericardial effusion Mild to moderate Mitral regurgitation Hypertension  Diabetes Mellitus Electrolyte abnormalities  Tobacco use  Anemia - suspect iron deficieny  She is responding well to diuretics therapy. Clinical exam and breathing has improved. 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  She also does have history of neuropathy which may also support amyloid. He NM PYP scan is equivocal for transthyretin amyloidosis- labs are pending. We still need to pursue this in the outpatient setting.   Her hypertensive heart disease may have also contributed to her cardiomyopathy.   In the meantime she is on losartan 50 mg daily , and  carvedilol 6.25 mg BID. Plan to transition the patient to entresto in the outpatient setting.  Add Aldactone 12.5 my daily. SGLT inbitiors also in the outpatient setting.  For now not pursing ischemic evaluation due to recent pharmacologic stress test in 12/2020. 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 If clinical picture change may reconsider further ischemic evaluation.  Her echo also did show moderate pericardial effusion with no evidence of tamponade but RV nit fully expanding - will get limited echo today.  Accelerated Hypertension - Add Aldactone 12.5 mg , continue coreg 12,5 BID for better blood pressure control. Continue losartan at 50 mg for now.  Diabetes mellitus - continue management per primary team.  Please keep K above 4 and mag above 2    Tobacco use - Recommended cessation   Chronic anemia - his appears to be chronic iron def anemia. Does not appear that she has has had any evaluation for this - GI or Heme/on eval? She need to be evaluated in the outpatient setting .  Addendum: Updates having the patient did get her echocardiogram which showed slightly improved EF 40 to 45%.  There has been no change in her  pericardial  effusion-add colchicine 0.6 milligrams daily.  For questions or updates, please contact [place] Please consult [email or web address] for contact info under Cardiology/STEMI.      Signed, Rosaria Ferries, PA-C  05/10/2021, 8:36 AM

## 2021-05-11 LAB — PROTEIN ELECTROPHORESIS, SERUM
A/G Ratio: 0.7 (ref 0.7–1.7)
Albumin ELP: 1.9 g/dL — ABNORMAL LOW (ref 2.9–4.4)
Alpha-1-Globulin: 0.2 g/dL (ref 0.0–0.4)
Alpha-2-Globulin: 0.9 g/dL (ref 0.4–1.0)
Beta Globulin: 0.9 g/dL (ref 0.7–1.3)
Gamma Globulin: 0.6 g/dL (ref 0.4–1.8)
Globulin, Total: 2.7 g/dL (ref 2.2–3.9)
Total Protein ELP: 4.6 g/dL — ABNORMAL LOW (ref 6.0–8.5)

## 2021-05-11 LAB — IMMUNOFIXATION ELECTROPHORESIS
IgA: 150 mg/dL (ref 87–352)
IgG (Immunoglobin G), Serum: 768 mg/dL (ref 586–1602)
IgM (Immunoglobulin M), Srm: 60 mg/dL (ref 26–217)
Total Protein ELP: 4.7 g/dL — ABNORMAL LOW (ref 6.0–8.5)

## 2021-05-11 NOTE — Discharge Summary (Addendum)
Physician Discharge Summary  Ivalee Strauser JEH:[id number] DOB: 28-Feb-1961 DOA: 05/07/2021  PCP: Jenel Lucks, PA-C  Admit date: 05/07/2021 Discharge date: 05/10/2021  Admitted From:Home Disposition:Home  Recommendations for Outpatient Follow-up:  Follow up with PCP, cardiology in 1-2 weeks Please obtain BMP in one week Please follow up on the following pending results:  Home Health:no  Equipment/Devices: none  Discharge Condition: Stable Code Status:   Code Status: Prior Diet recommendation:  Diet Order     None        Brief/Interim Summary: 60 year old female with diabetes, hypertension, tobacco abuse, HLD presents with chest pain shortness of breath lightest, symptom onset about a week ago.  She has had neck swelling for about 2 months saw cardiologist at antibiotics about a month ago had a stress test with EF 43%, Lasix was increased to 40 mg but never had significant increase in urine output saw her primary care doctor discontinued about 2 weeks ago. Patient reported orthopnea no fever chills abdominal pain. In the ED blood pressure in 110s to 150s, hyperglycemia 451 hemoglobin 10.7 and troponin 24> 23 flat chest x-ray mild left basilar atelectasis plus minus infiltrate with small left pleural effusion, elevated BNP 648 and respiratory virus panel COVID-19 negative.  Patient was given Lasix Claritin hydralazine metoprolol nitro insulin aspirin and admitted w/ consult with cardiology static changes but no STEMI She did not respond to initial 40 mg IV Lasix so increased dose to 80 mg.  Seen by cardiology medication were further adjusted patient was diuresed at this time had significant diuresis which is improved to her baseline about 2 months ago down from 61 to 53.6 kg. Underwent an MPI PET scan equivocal for amyloidosis immunofixation test pending.  Patient will need further outpatient follow-up to rule out infiltrative disease. Leg edema has improved shortness  of breath improved she is at baseline and okay for discharge home on oral Lasix.  She will continue with new medication Coreg, losartan and Aldactone.  Follow-up with outpatient CHF team/cardiology  Discharge Diagnoses:   Acute systolic congestive heart failure: Presented with shortness of breath leg swelling orthopnea chest pain elevated BNP 648, chest x-ray small pleural effusion.  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  Diuresed w/IV Lasix 80 mg bid.GDMT: Lopressor switched to Coreg, stopped lisinopril and started on losartan -can transition to [facility] as outpatient.  Added Aldactone increased Coreg to 12.5 mg for better blood pressure control.appreciate cardiology input.  Okay for discharge home today after limited echo.   Elevated troponin: Troponin flat likely demand ischemia in the setting of above.  Plan per cardiology on board. No chest pain this am.  Moderate pericardial effusion on echo but no tamponade.  Limited echo today prior to discharge and outpatient follow-up- CARDS ADVISED colchicine and lasix 40 mg daily and OP follow up on coming Tuesday.  Type 2 diabetes mellitus with peripheral neuropathy on long-term insulin with hypoglycemia last hba1c >14 in 3/22.  Fu pcp for insulin and dm instruction. [facility] IS NOT TAKING INSULIN CORRECTLY AND dm COORDINATOR SAW HER SWITCHED TO NOVOLIN 6 U BID.  Recent Labs  Lab 05/09/21 1239 05/09/21 1619 05/09/21 1634 05/09/21 2126 05/10/21 0629  GLUCAP 168* 64* 98 345* 188*    Essential hypertension: Poorly controlled, increasing Coreg further, continue losartan, added  Aldactone.  Hyperlipidemia LDL goal <100-not on statins.  Anemia likely chronic disease, anemia panel largely unremarkable  normal ferritin B12 folate  Recent Labs  Lab 05/07/21 1610 05/08/21 0205 05/09/21 0258 05/10/21 0141  HGB 10.7* 9.1* 9.8* 9.9*  HCT 33.2* 28.1* 29.7* 30.4*    Hypokalemia: Resolved.   Hypomagnesemia: repleted  Active tobacco use- advised cessation, she declined nicotine patch  Consults: cardiology  Subjective: Alert awake oriented leg edema has resolved.  No chest pain or shortness of breath.  Feels improved  Discharge Exam: Vitals:   05/10/21 1144 05/10/21 1447  BP: (!) 175/88 139/76  Pulse: 75 79  Resp:  12  Temp:    SpO2:     General: Pt is alert, awake, not in acute distress Cardiovascular: RRR, S1/S2 +, no rubs, no gallops Respiratory: CTA bilaterally, no wheezing, no rhonchi Abdominal: Soft, NT, ND, bowel sounds + Extremities: no edema, no cyanosis  Discharge Instructions  Discharge Instructions     Discharge instructions   Complete by: As directed    You will need to follow-up with cardiology and PCP.  Check BMP in a week.  Please call call MD or return to ER for similar or worsening recurring problem that brought you to hospital or if any fever,nausea/vomiting,abdominal pain, uncontrolled pain, chest pain,  shortness of breath or any other alarming symptoms.  Please follow-up your doctor as instructed in a week time and call the office for appointment.  Please monitor weight daily, avoid strain and stop activity anytime you have chest pain or worsening shortness of breath.Anytime you have any of the following symptoms: 1) 3 pound weight gain in 24 hours or 5 pounds in 1 week 2) shortness of breath, with or without a dry hacking cough 3) swelling in the hands, feet or stomach 4) if you have to sleep on extra pillows at night in order to breathe.    Please avoid alcohol, smoking, or any other illicit substance and maintain healthy habits including taking your regular medications as prescribed.  You were cared for by a hospitalist during your hospital stay.  If you have any questions about your discharge medications or the care you received while you were in the hospital after you are discharged, you can call the unit and ask to speak with the hospitalist on call if the hospitalist that took care of you is not available.  Once you are discharged, your primary care physician will handle any further medical issues. Please note that NO REFILLS for any discharge medications will be authorized once you are discharged, as it is imperative that you return to your primary care physician (or establish a relationship with a primary care physician if you do not have one) for your aftercare needs so that they can reassess your need for medications and monitor your lab values   Increase activity slowly   Complete by: As directed       Allergies as of 05/10/2021       Reactions   Penicillins    itch   Strawberry (diagnostic) Itching   Tomato Itching        Medication List     STOP taking these medications    doxazosin 1 MG tablet Commonly known as: CARDURA   insulin detemir 100 UNIT/ML FlexPen Commonly known as: LEVEMIR   Invokana 100 MG Tabs tablet Generic drug: canagliflozin   Levemir FlexTouch 100 UNIT/ML FlexPen Generic drug: insulin detemir   lisinopril 40 MG tablet Commonly known as: ZESTRIL  metoprolol tartrate 50 MG tablet Commonly known as: LOPRESSOR   NovoLOG FlexPen 100 UNIT/ML FlexPen Generic drug: insulin aspart       TAKE these medications    acetaminophen 500 MG tablet Commonly known as: TYLENOL Take 1,000 mg by mouth every 6 (six) hours as needed for mild pain.   albuterol 108 (90 Base) MCG/ACT inhaler Commonly known as: VENTOLIN HFA Inhale 2 puffs into the lungs every 6 (six) hours as needed for wheezing or shortness of breath.   Bayer Microlet Lancets lancets Check blood sugar up to 3 times a day as instructed   benzonatate 100 MG capsule Commonly known as: TESSALON Take 100 mg by mouth 3 (three) times  daily as needed for cough.   carvedilol 12.5 MG tablet Commonly known as: COREG Take 1 tablet (12.5 mg total) by mouth 2 (two) times daily with a meal.   colchicine 0.6 MG tablet Take 1 tablet (0.6 mg total) by mouth daily.   DULoxetine 60 MG capsule Commonly known as: CYMBALTA Take 1 capsule (60 mg total) by mouth daily.   ezetimibe 10 MG tablet Commonly known as: ZETIA Take 10 mg by mouth daily.   furosemide 20 MG tablet Commonly known as: LASIX Take 2 tablets (40 mg total) by mouth daily. No refills What changed: how much to take   gabapentin 300 MG capsule Commonly known as: Neurontin Take 3 capsules (900 mg total) by mouth 3 (three) times daily. What changed: how much to take   glucose blood test strip Check blood sugar up to 3 times a day as instructed   losartan 50 MG tablet Commonly known as: COZAAR Take 1 tablet (50 mg total) by mouth daily.   NovoLIN 70/30 Kwikpen (70-30) 100 UNIT/ML KwikPen Generic drug: insulin isophane & regular human KwikPen Inject 6 Units into the skin 2 (two) times daily before lunch and supper.   spironolactone 25 MG tablet Commonly known as: ALDACTONE Take 0.5 tablets (12.5 mg total) by mouth daily.   Unifine Pentips 32G X 4 MM Misc Generic drug: Insulin Pen Needle USE TO INJECT INSULIN DAILY        Follow-up Information     Jenel Lucks, PA-C Follow up on 05/18/2021.   Specialty: Internal Medicine Why: The office will call patient Contact information: [place] [address] [phone]         Berniece Salines, DO Follow up.   Specialty: Cardiology Why: Hospital follow-up with Cardiology scheduled for 05/15/2021 at 11:40pm. Please arrive 15 minutes early for check-in. Contact information: [address] [place] [address] [phone]                Allergies  Allergen Reactions   Penicillins     itch   Strawberry (Diagnostic) Itching   Tomato Itching     The results of significant diagnostics from this hospitalization (including imaging, microbiology, ancillary and laboratory) are listed below for reference.    Microbiology: Recent Results (from the past 240 hour(s))  Resp Panel by RT-PCR (Flu A&B, Covid) Nasopharyngeal Swab     Status: None   Collection Time: 05/07/21  5:44 PM   Specimen: Nasopharyngeal Swab; Nasopharyngeal(NP) swabs in vial transport medium  Result Value Ref Range Status   SARS Coronavirus 2 by RT PCR NEGATIVE NEGATIVE Final    Comment: (NOTE) SARS-CoV-2 target nucleic acids are NOT DETECTED.  The SARS-CoV-2 RNA is generally detectable in upper respiratory specimens during the acute phase of infection. The  lowest concentration of SARS-CoV-2 viral copies this assay can detect is 138 copies/mL. A negative result does not preclude SARS-Cov-2 infection and should not be used as the sole basis for treatment or other patient management decisions. A negative result may occur with  improper specimen collection/handling, submission of specimen other than nasopharyngeal swab, presence of viral mutation(s) within the areas targeted by this assay, and inadequate number of viral copies(<138 copies/mL). A negative result must be combined with clinical observations, patient history, and epidemiological information. The expected result is Negative.  Fact Sheet for Patients:  EntrepreneurPulse.com.au  Fact Sheet for Healthcare Providers:  IncredibleEmployment.be  This test is no t yet approved or cleared by the [place] FDA and  has been authorized for detection and/or diagnosis of SARS-CoV-2 by FDA under an Emergency Use Authorization (EUA). This EUA will remain  in effect (meaning this test can be used) for the duration of the COVID-19 declaration under Section 564(b)(1) of the Act, 21 U.S.C.section 360bbb-3(b)(1), unless the authorization is terminated  or revoked sooner.        Influenza A by PCR NEGATIVE NEGATIVE Final   Influenza B by PCR NEGATIVE NEGATIVE Final    Comment: (NOTE) The Xpert Xpress SARS-CoV-2/FLU/RSV plus assay is intended as an aid in the diagnosis of influenza from Nasopharyngeal swab specimens and should not be used as a sole basis for treatment. Nasal washings and aspirates are unacceptable for Xpert Xpress SARS-CoV-2/FLU/RSV testing.  Fact Sheet for Patients: EntrepreneurPulse.com.au  Fact Sheet for Healthcare Providers: IncredibleEmployment.be  This test is not yet approved or cleared by the [place] FDA and has been authorized for detection and/or diagnosis of SARS-CoV-2 by FDA under an Emergency Use Authorization (EUA). This EUA will remain in effect (meaning this test can be used) for the duration of the COVID-19 declaration under Section 564(b)(1) of the Act, 21 U.S.C. section 360bbb-3(b)(1), unless the authorization is terminated or revoked.  Performed at [facility], [address] [place], [place] [address]   Gastrointestinal Panel by PCR , Stool     Status: None   Collection Time: 05/08/21  6:33 PM   Specimen: Stool  Result Value Ref Range Status   Campylobacter species NOT DETECTED NOT DETECTED Final   Plesimonas shigelloides NOT DETECTED NOT DETECTED Final   Salmonella species NOT DETECTED NOT DETECTED Final   Yersinia enterocolitica NOT DETECTED NOT DETECTED Final   Vibrio species NOT DETECTED NOT DETECTED Final   Vibrio cholerae NOT DETECTED NOT DETECTED Final   Enteroaggregative E coli (EAEC) NOT DETECTED NOT DETECTED Final   Enteropathogenic E coli (EPEC) NOT DETECTED NOT DETECTED Final   Enterotoxigenic E coli (ETEC) NOT DETECTED NOT DETECTED Final   Shiga like toxin producing E coli (STEC) NOT DETECTED NOT DETECTED Final   Shigella/Enteroinvasive E coli (EIEC) NOT DETECTED NOT DETECTED Final   Cryptosporidium NOT DETECTED NOT DETECTED Final   Cyclospora  cayetanensis NOT DETECTED NOT DETECTED Final   Entamoeba histolytica NOT DETECTED NOT DETECTED Final   Giardia lamblia NOT DETECTED NOT DETECTED Final   Adenovirus F40/41 NOT DETECTED NOT DETECTED Final   Astrovirus NOT DETECTED NOT DETECTED Final   Norovirus GI/GII NOT DETECTED NOT DETECTED Final   Rotavirus A NOT DETECTED NOT DETECTED Final   Sapovirus (I, II, IV, and V) NOT DETECTED NOT DETECTED Final    Comment: Performed at [facility], [address] [place], [place] [address]    Procedures/Studies: DG Chest 2 View  Result Date: 05/07/2021  CLINICAL DATA:  Chest pain. EXAM: CHEST - 2 VIEW COMPARISON:  None. FINDINGS: Mild atelectasis and/or infiltrate is seen within the left lung base. Diffuse, chronic appearing increased lung markings are also seen. There is a small left pleural effusion. No pneumothorax is identified. The heart size and mediastinal contours are within normal limits. The visualized skeletal structures are unremarkable. IMPRESSION: 1. Mild left basilar atelectasis and/or infiltrate. 2. Small left pleural effusion. Electronically Signed   By: Virgina Norfolk M.D.   On: 05/07/2021 16:53   NM CARDIAC AMYLOID TUMOR LOC INFLAM SPECT 1 DAY  Result Date: 05/09/2021 CLINICAL DATA:  Acute combined systolic and diastolic congestive heart failure EXAM: NUCLEAR MEDICINE TUMOR LOCALIZATION. PYP CARDIAC AMYLOIDOSIS SCAN WITH SPECT TECHNIQUE: Following intravenous administration of radiopharmaceutical, anterior planar images of the chest were obtained. Regions of interest were placed on the heart and contralateral chest wall for quantitative assessment. Additional SPECT imaging of the chest was obtained. RADIOPHARMACEUTICALS:  20.5 mCi Tc-15m PYROPHOSPHATE FINDINGS: Planar Visual assessment: Anterior planar imaging demonstrates radiotracer uptake within the heart less than uptake within the adjacent ribs (Grade 1). Quantitative assessment : Quantitative assessment of the  cardiac uptake compared to the contralateral chest wall is equal to 1.10 (H/CL = 1.10). SPECT assessment: SPECT imaging of the chest demonstrates minimal radiotracer accumulation within the LEFT ventricle. IMPRESSION: Visual and quantitative assessment (grade 1, H/CLL equal 1.10) are equivocal for transthyretin amyloidosis. Electronically Signed   By: Lavonia Dana M.D.   On: 05/09/2021 18:58   ECHOCARDIOGRAM COMPLETE  Result Date: 05/08/2021    ECHOCARDIOGRAM REPORT   Patient Name:   [facility] Date of Exam: 05/08/2021 Medical Rec #:  [id number]       Height:       67.0 in Accession #:    [id number]      Weight:       133.2 lb Date of Birth:  19-May-1961       BSA:          1.701 m Patient Age:    89 years        BP:           164/89 mmHg Patient Gender: F               HR:           76 bpm. Exam Location:  Inpatient Procedure: 2D Echo, Cardiac Doppler, Color Doppler and Strain Analysis Indications:    Dyspnea R06.00  History:        Patient has no prior history of Echocardiogram examinations.  Sonographer:    Merrie Roof RDCS Referring Phys: [id number] [place]  1. Left ventricular ejection fraction, by estimation, is 25%. The left ventricle has severely decreased function. The left ventricle demonstrates global hypokinesis. There is severe concentric left ventricular hypertrophy. Left ventricular diastolic parameters are consistent with Grade II diastolic dysfunction (pseudonormalization). There is relative apical sparing on strain imaging.  2. Moderate pericardial effusion at greatest anterior to the right ventricle; small in all other locations (circumferential effusion). There is lack of full expansion of the RV mid and apex; this could be an early sign on increased pericardial pressure.  IVC is normal in size.  3. The inferior vena cava is normal in size with greater than 50% respiratory variability, suggesting right atrial pressure of 3 mmHg.  4. Left atrial size was severely  dilated.  5. Right ventricular systolic function is normal. The right ventricular size is normal. Mildly increased right  ventricular wall thickness. There is normal pulmonary artery systolic pressure.  6. Right atrial size was mildly dilated.  7. The mitral valve is normal in structure. Mild to moderate mitral valve regurgitation. No evidence of mitral stenosis.  8. The aortic valve is normal in structure. Aortic valve regurgitation is not visualized. No aortic stenosis is present. Comparison(s): No prior Echocardiogram. Conclusion(s)/Recommendation(s): EF appears worse that presviously evaluated ([facility] NM Stress). Constellation of increase thickness, pericardial effusion, strain imaging findings, and atrial enlargement can be seen in cardiac amyloidosis; consider pyrophosphate scan or cardiac MRI if clinically indicated. Consider repeat echocardiogram (limited- effusion) in 1-2 days to evaluate for increase in pericardial pressure or effusion. FINDINGS  Left Ventricle: Left ventricular ejection fraction, by estimation, is 25%. The left ventricle has severely decreased function. The left ventricle demonstrates global hypokinesis. The left ventricular internal cavity size was normal in size. There is severe concentric left ventricular hypertrophy. Left ventricular diastolic parameters are consistent with Grade II diastolic dysfunction (pseudonormalization). Right Ventricle: The right ventricular size is normal. Mildly increased right ventricular wall thickness. Right ventricular systolic function is normal. There is normal pulmonary artery systolic pressure. The tricuspid regurgitant velocity is 2.31 m/s, and with an assumed right atrial pressure of 8 mmHg, the estimated right ventricular systolic pressure is 77.8 mmHg. Left Atrium: Left atrial size was severely dilated. Right Atrium: Right atrial size was mildly dilated. Pericardium: A moderately sized pericardial effusion is present. The pericardial effusion is  circumferential and anterior to the right ventricle. Mitral Valve: The mitral valve is normal in structure. Mild to moderate mitral valve regurgitation. No evidence of mitral valve stenosis. Tricuspid Valve: The tricuspid valve is normal in structure. Tricuspid valve regurgitation is mild. Aortic Valve: The aortic valve is normal in structure. Aortic valve regurgitation is not visualized. No aortic stenosis is present. Aortic valve mean gradient measures 3.0 mmHg. Aortic valve peak gradient measures 4.6 mmHg. Aortic valve area, by VTI measures 2.21 cm. Pulmonic Valve: The pulmonic valve was normal in structure. Pulmonic valve regurgitation is not visualized. No evidence of pulmonic stenosis. Aorta: The aortic root is normal in size and structure. Venous: The inferior vena cava is normal in size with greater than 50% respiratory variability, suggesting right atrial pressure of 3 mmHg. IAS/Shunts: There is right bowing of the interatrial septum, suggestive of elevated left atrial pressure. The atrial septum is grossly normal.  LEFT VENTRICLE PLAX 2D LVIDd:         4.30 cm      Diastology LVIDs:         4.10 cm      LV e' medial:    5.33 cm/s LV PW:         1.60 cm      LV E/e' medial:  14.0 LV IVS:        1.40 cm      LV e' lateral:   5.22 cm/s LVOT diam:     1.90 cm      LV E/e' lateral: 14.3 LV SV:         48 LV SV Index:   28 LVOT Area:     2.84 cm                              3D Volume EF: LV Volumes (MOD)            3D EF:        38 % LV  vol d, MOD A2C: 72.6 ml  LV EDV:       209 ml LV vol d, MOD A4C: 103.0 ml LV ESV:       129 ml LV vol s, MOD A2C: 41.7 ml  LV SV:        80 ml LV vol s, MOD A4C: 65.4 ml LV SV MOD A2C:     30.9 ml LV SV MOD A4C:     103.0 ml LV SV MOD BP:      34.3 ml RIGHT VENTRICLE RV Basal diam:  3.00 cm LEFT ATRIUM              Index        RIGHT ATRIUM           Index LA diam:        4.00 cm  2.35 cm/m   RA Area:     20.30 cm LA Vol (A2C):   104.0 ml 61.13 ml/m  RA Volume:   62.70 ml   36.86 ml/m LA Vol (A4C):   89.6 ml  52.67 ml/m LA Biplane Vol: 100.0 ml 58.78 ml/m  AORTIC VALVE AV Area (Vmax):    1.94 cm AV Area (Vmean):   2.05 cm AV Area (VTI):     2.21 cm AV Vmax:           107.00 cm/s AV Vmean:          80.000 cm/s AV VTI:            0.216 m AV Peak Grad:      4.6 mmHg AV Mean Grad:      3.0 mmHg LVOT Vmax:         73.10 cm/s LVOT Vmean:        57.900 cm/s LVOT VTI:          0.168 m LVOT/AV VTI ratio: 0.78  AORTA Ao Root diam: 3.10 cm MITRAL VALVE                TRICUSPID VALVE MV Area (PHT): 3.05 cm     TR Peak grad:   21.3 mmHg MV Decel Time: 249 msec     TR Vmax:        231.00 cm/s MV E velocity: 74.60 cm/s MV A velocity: 113.00 cm/s  SHUNTS MV E/A ratio:  0.66         Systemic VTI:  0.17 m                             Systemic Diam: 1.90 cm Rudean Haskell MD Electronically signed by Rudean Haskell MD Signature Date/Time: 05/08/2021/3:03:49 PM    Final    ECHOCARDIOGRAM LIMITED  Result Date: 05/10/2021    ECHOCARDIOGRAM LIMITED REPORT   Patient Name:   [facility] Date of Exam: 05/10/2021 Medical Rec #:  [id number]       Height:       67.0 in Accession #:    [id number]      Weight:       118.1 lb Date of Birth:  10-07-1960       BSA:          1.617 m Patient Age:    60 years        BP:           175/88 mmHg Patient Gender: F  HR:           84 bpm. Exam Location:  Inpatient Procedure: Limited Echo, Limited Color Doppler and Cardiac Doppler Indications:    pericardial effusion  History:        Patient has prior history of Echocardiogram examinations, most                 recent 05/08/2021. CHF, Signs/Symptoms:Chest Pain; Risk                 Factors:Diabetes, Hypertension and Dyslipidemia.  Sonographer:    Johny Chess RDCS Referring Phys: [id number] [place] IMPRESSIONS  1. Moderate, predominantly anterior pericardial effusion; RA and mild RV collapse; no respiratory variation; IVC normal in size; compared to 05/08/21 LV function improved;  pericardial effusion unchanged.  2. Left ventricular ejection fraction, by estimation, is 40 to 45%. The left ventricle has mildly decreased function. The left ventricle demonstrates global hypokinesis. There is severe left ventricular hypertrophy.  3. Right ventricular systolic function is normal. The right ventricular size is normal. Tricuspid regurgitation signal is inadequate for assessing PA pressure.  4. Left atrial size was moderately dilated.  5. Moderate pericardial effusion.  6. Trivial mitral valve regurgitation.  7. The aortic valve is tricuspid. FINDINGS  Left Ventricle: Left ventricular ejection fraction, by estimation, is 40 to 45%. The left ventricle has mildly decreased function. The left ventricle demonstrates global hypokinesis. The left ventricular internal cavity size was normal in size. There is  severe left ventricular hypertrophy. Right Ventricle: The right ventricular size is normal. Right ventricular systolic function is normal. Tricuspid regurgitation signal is inadequate for assessing PA pressure. The tricuspid regurgitant velocity is 2.66 m/s, and with an assumed right atrial  pressure of 3 mmHg, the estimated right ventricular systolic pressure is 64.3 mmHg. Left Atrium: Left atrial size was moderately dilated. Right Atrium: Right atrial size was normal in size. Pericardium: A moderately sized pericardial effusion is present. Mitral Valve: Trivial mitral valve regurgitation. Tricuspid Valve: Tricuspid valve regurgitation is trivial. Aortic Valve: The aortic valve is tricuspid. Pulmonic Valve: The pulmonic valve was normal in structure. IAS/Shunts: No atrial level shunt detected by color flow Doppler. Additional Comments: Moderate, predominantly anterior pericardial effusion; RA and mild RV collapse; no respiratory variation; IVC normal in size; compared to 05/08/21 LV function improved; pericardial effusion unchanged. IVC IVC diam: 1.30 cm TRICUSPID VALVE TR Peak grad:   28.3 mmHg TR  Vmax:        266.00 cm/s Kirk Ruths MD Electronically signed by Kirk Ruths MD Signature Date/Time: 05/10/2021/2:46:20 PM    Final     Labs: BNP (last 3 results) Recent Labs    05/07/21 1610  BNP 329.5*   Basic Metabolic Panel: Recent Labs  Lab 05/07/21 1610 05/08/21 0205 05/09/21 0258 05/10/21 0141  NA 136 138 137 136  K 3.4* 3.2* 3.1* 3.9  CL 103 104 100 100  CO2 27 28 31 31   GLUCOSE 451* 196* 94 240*  BUN 17 18 17 16   CREATININE 1.16* 1.18* 0.95 1.05*  CALCIUM 8.1* 7.8* 8.0* 8.0*  MG  --  1.5*  --   --    Liver Function Tests: No results for input(s): AST, ALT, ALKPHOS, BILITOT, PROT, ALBUMIN in the last 168 hours. No results for input(s): LIPASE, AMYLASE in the last 168 hours. No results for input(s): AMMONIA in the last 168 hours. CBC: Recent Labs  Lab 05/07/21 1610 05/08/21 0205 05/09/21 0258 05/10/21 0141  WBC 8.9 8.8 7.5 7.3  HGB 10.7* 9.1* 9.8* 9.9*  HCT 33.2* 28.1* 29.7* 30.4*  MCV 87.6 87.8 86.3 87.4  PLT 323 275 281 284   Cardiac Enzymes: No results for input(s): CKTOTAL, CKMB, CKMBINDEX, TROPONINI in the last 168 hours. BNP: Invalid input(s): POCBNP CBG: Recent Labs  Lab 05/09/21 1619 05/09/21 1634 05/09/21 2126 05/10/21 0629 05/10/21 1139  GLUCAP 64* 98 345* 188* 162*   D-Dimer No results for input(s): DDIMER in the last 72 hours. Hgb A1c No results for input(s): HGBA1C in the last 72 hours. Lipid Profile No results for input(s): CHOL, HDL, LDLCALC, TRIG, CHOLHDL, LDLDIRECT in the last 72 hours. Thyroid function studies No results for input(s): TSH, T4TOTAL, T3FREE, THYROIDAB in the last 72 hours.  Invalid input(s): FREET3 Anemia work up Recent Labs    05/09/21 1121  VITAMINB12 385  FOLATE 10.2  FERRITIN 77  TIBC 228*  IRON 37  RETICCTPCT 1.6   Urinalysis    Component Value Date/Time   COLORURINE YELLOW 01/16/2016 1510   APPEARANCEUR CLOUDY (A) 01/16/2016 1510   LABSPEC >1.046 (H) 01/16/2016 1510   PHURINE 5.5  01/16/2016 1510   GLUCOSEU >1000 (A) 01/16/2016 1510   HGBUR SMALL (A) 01/16/2016 1510   BILIRUBINUR NEGATIVE 01/16/2016 1510   KETONESUR NEGATIVE 01/16/2016 1510   PROTEINUR NEGATIVE 01/16/2016 1510   UROBILINOGEN 0.2 09/29/2011 2007   NITRITE POSITIVE (A) 01/16/2016 1510   LEUKOCYTESUR NEGATIVE 01/16/2016 1510   Sepsis Labs Invalid input(s): PROCALCITONIN,  WBC,  LACTICIDVEN Microbiology Recent Results (from the past 240 hour(s))  Resp Panel by RT-PCR (Flu A&B, Covid) Nasopharyngeal Swab     Status: None   Collection Time: 05/07/21  5:44 PM   Specimen: Nasopharyngeal Swab; Nasopharyngeal(NP) swabs in vial transport medium  Result Value Ref Range Status   SARS Coronavirus 2 by RT PCR NEGATIVE NEGATIVE Final    Comment: (NOTE) SARS-CoV-2 target nucleic acids are NOT DETECTED.  The SARS-CoV-2 RNA is generally detectable in upper respiratory specimens during the acute phase of infection. The lowest concentration of SARS-CoV-2 viral copies this assay can detect is 138 copies/mL. A negative result does not preclude SARS-Cov-2 infection and should not be used as the sole basis for treatment or other patient management decisions. A negative result may occur with  improper specimen collection/handling, submission of specimen other than nasopharyngeal swab, presence of viral mutation(s) within the areas targeted by this assay, and inadequate number of viral copies(<138 copies/mL). A negative result must be combined with clinical observations, patient history, and epidemiological information. The expected result is Negative.  Fact Sheet for Patients:  EntrepreneurPulse.com.au  Fact Sheet for Healthcare Providers:  IncredibleEmployment.be  This test is no t yet approved or cleared by the [place] FDA and  has been authorized for detection and/or diagnosis of SARS-CoV-2 by FDA under an Emergency Use Authorization (EUA). This EUA will remain  in  effect (meaning this test can be used) for the duration of the COVID-19 declaration under Section 564(b)(1) of the Act, 21 U.S.C.section 360bbb-3(b)(1), unless the authorization is terminated  or revoked sooner.       Influenza A by PCR NEGATIVE NEGATIVE Final   Influenza B by PCR NEGATIVE NEGATIVE Final    Comment: (NOTE) The Xpert Xpress SARS-CoV-2/FLU/RSV plus assay is intended as an aid in the diagnosis of influenza from Nasopharyngeal swab specimens and should not be used as a sole basis for treatment. Nasal washings and aspirates are unacceptable for Xpert Xpress SARS-CoV-2/FLU/RSV testing.  Fact Sheet for Patients: EntrepreneurPulse.com.au  Fact  Sheet for Healthcare Providers: IncredibleEmployment.be  This test is not yet approved or cleared by the [place] and has been authorized for detection and/or diagnosis of SARS-CoV-2 by FDA under an Emergency Use Authorization (EUA). This EUA will remain in effect (meaning this test can be used) for the duration of the COVID-19 declaration under Section 564(b)(1) of the Act, 21 U.S.C. section 360bbb-3(b)(1), unless the authorization is terminated or revoked.  Performed at [facility], [address] [place], [place] [address]   Gastrointestinal Panel by PCR , Stool     Status: None   Collection Time: 05/08/21  6:33 PM   Specimen: Stool  Result Value Ref Range Status   Campylobacter species NOT DETECTED NOT DETECTED Final   Plesimonas shigelloides NOT DETECTED NOT DETECTED Final   Salmonella species NOT DETECTED NOT DETECTED Final   Yersinia enterocolitica NOT DETECTED NOT DETECTED Final   Vibrio species NOT DETECTED NOT DETECTED Final   Vibrio cholerae NOT DETECTED NOT DETECTED Final   Enteroaggregative E coli (EAEC) NOT DETECTED NOT DETECTED Final   Enteropathogenic E coli (EPEC) NOT DETECTED NOT DETECTED Final   Enterotoxigenic E coli (ETEC) NOT DETECTED NOT  DETECTED Final   Shiga like toxin producing E coli (STEC) NOT DETECTED NOT DETECTED Final   Shigella/Enteroinvasive E coli (EIEC) NOT DETECTED NOT DETECTED Final   Cryptosporidium NOT DETECTED NOT DETECTED Final   Cyclospora cayetanensis NOT DETECTED NOT DETECTED Final   Entamoeba histolytica NOT DETECTED NOT DETECTED Final   Giardia lamblia NOT DETECTED NOT DETECTED Final   Adenovirus F40/41 NOT DETECTED NOT DETECTED Final   Astrovirus NOT DETECTED NOT DETECTED Final   Norovirus GI/GII NOT DETECTED NOT DETECTED Final   Rotavirus A NOT DETECTED NOT DETECTED Final   Sapovirus (I, II, IV, and V) NOT DETECTED NOT DETECTED Final    Comment: Performed at [facility], [address] [place], [place] [address]  Time coordinating discharge:35 minutes  SIGNED: Antonieta Pert, MD  [facility] 05/11/2021, 1:15 PM  If 7PM-7AM, please contact night-coverage [email or web address]

## 2021-05-15 ENCOUNTER — Other Ambulatory Visit: Payer: Self-pay

## 2021-05-15 ENCOUNTER — Ambulatory Visit (INDEPENDENT_AMBULATORY_CARE_PROVIDER_SITE_OTHER): Payer: BLUE CROSS/BLUE SHIELD | Admitting: Cardiology

## 2021-05-15 ENCOUNTER — Other Ambulatory Visit (HOSPITAL_COMMUNITY): Payer: Self-pay

## 2021-05-15 VITALS — BP 162/74 | HR 82 | Ht 67.5 in | Wt 120.2 lb

## 2021-05-15 DIAGNOSIS — I5020 Unspecified systolic (congestive) heart failure: Secondary | ICD-10-CM | POA: Diagnosis not present

## 2021-05-15 DIAGNOSIS — E782 Mixed hyperlipidemia: Secondary | ICD-10-CM

## 2021-05-15 DIAGNOSIS — I10 Essential (primary) hypertension: Secondary | ICD-10-CM | POA: Diagnosis not present

## 2021-05-15 DIAGNOSIS — I110 Hypertensive heart disease with heart failure: Secondary | ICD-10-CM | POA: Insufficient documentation

## 2021-05-15 DIAGNOSIS — I3139 Other pericardial effusion (noninflammatory): Secondary | ICD-10-CM | POA: Diagnosis not present

## 2021-05-15 DIAGNOSIS — E1100 Type 2 diabetes mellitus with hyperosmolarity without nonketotic hyperglycemic-hyperosmolar coma (NKHHC): Secondary | ICD-10-CM | POA: Insufficient documentation

## 2021-05-15 MED ORDER — ENTRESTO 24-26 MG PO TABS
1.0000 | ORAL_TABLET | Freq: Two times a day (BID) | ORAL | 3 refills | Status: DC
  Filled 2021-05-15: qty 60, 30d supply, fill #0

## 2021-05-15 NOTE — Patient Instructions (Signed)
Medication Instructions:  Your physician has recommended you make the following change in your medication:  STOP: Losartan  START: Entresto 24-26 mg teice daily *If you need a refill on your cardiac medications before your next appointment, please call your pharmacy*   Lab Work: Your physician recommends that you return for lab work in:  TODAY: BMET, [place] If you have labs (blood work) drawn today and your tests are completely normal, you will receive your results only by: [place] (if you have MyChart) OR A paper copy in the mail If you have any lab test that is abnormal or we need to change your treatment, we will call you to review the results.   Testing/Procedures: Your physician has requested that you have an echocardiogram. Echocardiography is a painless test that uses sound waves to create images of your heart. It provides your doctor with information about the size and shape of your heart and how well your heart's chambers and valves are working. This procedure takes approximately one hour. There are no restrictions for this procedure.    Follow-Up: At [facility], you and your health needs are our priority.  As part of our continuing mission to provide you with exceptional heart care, we have created designated Provider Care Teams.  These Care Teams include your primary Cardiologist (physician) and Advanced Practice Providers (APPs -  Physician Assistants and Nurse Practitioners) who all work together to provide you with the care you need, when you need it.  We recommend signing up for the patient portal called "MyChart".  Sign up information is provided on this After Visit Summary.  MyChart is used to connect with patients for Virtual Visits (Telemedicine).  Patients are able to view lab/test results, encounter notes, upcoming appointments, etc.  Non-urgent messages can be sent to your provider as well.   To learn more about what you can do with MyChart, go to  [email or web address].    Your next appointment:   12 week(s)  The format for your next appointment:   In Person  Provider:   Berniece Salines, DO [address], [place], [place] [address]    Other Instructions Echocardiogram An echocardiogram is a test that uses sound waves (ultrasound) to produce images of the heart. Images from an echocardiogram can provide important information about: Heart size and shape. The size and thickness and movement of your heart's walls. Heart muscle function and strength. Heart valve function or if you have stenosis. Stenosis is when the heart valves are too narrow. If blood is flowing backward through the heart valves (regurgitation). A tumor or infectious growth around the heart valves. Areas of heart muscle that are not working well because of poor blood flow or injury from a heart attack. Aneurysm detection. An aneurysm is a weak or damaged part of an artery wall. The wall bulges out from the normal force of blood pumping through the body. Tell a health care provider about: Any allergies you have. All medicines you are taking, including vitamins, herbs, eye drops, creams, and over-the-counter medicines. Any blood disorders you have. Any surgeries you have had. Any medical conditions you have. Whether you are pregnant or may be pregnant. What are the risks? Generally, this is a safe test. However, problems may occur, including an allergic reaction to dye (contrast) that may be used during the test. What happens before the test? No specific preparation is needed. You may eat and drink normally. What happens during the test?  You will take off your  clothes from the waist up and put on a hospital gown. Electrodes or electrocardiogram (ECG)patches may be placed on your chest. The electrodes or patches are then connected to a device that monitors your heart rate and rhythm. You will lie down on a table for an ultrasound exam. A gel will be  applied to your chest to help sound waves pass through your skin. A handheld device, called a transducer, will be pressed against your chest and moved over your heart. The transducer produces sound waves that travel to your heart and bounce back (or "echo" back) to the transducer. These sound waves will be captured in real-time and changed into images of your heart that can be viewed on a video monitor. The images will be recorded on a computer and reviewed by your health care provider. You may be asked to change positions or hold your breath for a short time. This makes it easier to get different views or better views of your heart. In some cases, you may receive contrast through an IV in one of your veins. This can improve the quality of the pictures from your heart. The procedure may vary among health care providers and hospitals. What can I expect after the test? You may return to your normal, everyday life, including diet, activities, and medicines, unless your health care provider tells you not to do that. Follow these instructions at home: It is up to you to get the results of your test. Ask your health care provider, or the department that is doing the test, when your results will be ready. Keep all follow-up visits. This is important. Summary An echocardiogram is a test that uses sound waves (ultrasound) to produce images of the heart. Images from an echocardiogram can provide important information about the size and shape of your heart, heart muscle function, heart valve function, and other possible heart problems. You do not need to do anything to prepare before this test. You may eat and drink normally. After the echocardiogram is completed, you may return to your normal, everyday life, unless your health care provider tells you not to do that. This information is not intended to replace advice given to you by your health care provider. Make sure you discuss any questions you have with  your health care provider. Document Revised: 03/07/2020 Document Reviewed: 03/07/2020 Elsevier Patient Education  2022 [employer].

## 2021-05-15 NOTE — Progress Notes (Signed)
Cardiology Office Note:    Date:  05/15/2021   ID:  Amber Cross, DOB [birth year], MRN [id number]  PCP:  Jenel Lucks, PA-C  Cardiologist:  Berniece Salines, DO  Electrophysiologist:  None   Referring MD: Burna Cash*   " I am doing well"  History of Present Illness:    Amber Cross is a 60 y.o. female with a hx of hypertension, hyperlipidemia, diabetes mellitus, tobacco use, heart failure with depressed ejection fraction recently diagnosed during her hospitalization [facility].  I did meet the patient for the first time during her hospital at [facility] at which time she presented for shortness of breath as well as chest pain.  She was noted to be in acute exacerbation of heart failure during her hospitalization she was diuresed.  Her echocardiogram findings was suggestive of infiltrative disease/amyloid.  Nuclear medicine PYP study was done which was equivocal for transfer rhythm.  Lab work which was also done was not impressive.    During her hospitalization she diuresed significantly and her repeat echocardiogram to assess her pericardial effusion was done prior to discharge unfortunately she still had moderate pericardial effusion.  Her troponin was mildly elevated which was suspected to be in the setting of her acute exacerbation of heart failure.  She did not have any angina symptoms also reported throughout that a month ago she had had a nuclear stress test at [facility] which was normal.  Her medications were adjusted her lisinopril was stopped and transition to losartan, she was started on Aldactone, there is concern for the pericardial effusion is pericarditis she was placed on colchicine 0.6 mg daily and placed on carvedilol 12.5 mg twice daily. She had already been on atorvastatin and Zetia was added to the regimen.  Since her discharge she tells me that she has been doing well but the last 2 days she has had some diarrhea.   Which I do suspect may be in the setting of her colchicine.  No other complaints at this time.  Follow-up  Past Medical History:  Diagnosis Date   Diabetes mellitus    Hypercholesteremia    Hypertension    Tobacco abuse     Past Surgical History:  Procedure Laterality Date   ANTERIOR CRUCIATE LIGAMENT REPAIR Right    CATARACT EXTRACTION     OVARY SURGERY     RIGHT OOPHORECTOMY Right    SMALL INTESTINE SURGERY     TUBAL LIGATION      Current Medications: Current Meds  Medication Sig   acetaminophen (TYLENOL) 500 MG tablet Take 1,000 mg by mouth every 6 (six) hours as needed for mild pain.   albuterol (VENTOLIN HFA) 108 (90 Base) MCG/ACT inhaler Inhale 2 puffs into the lungs every 6 (six) hours as needed for wheezing or shortness of breath.   Bayer Microlet Lancets lancets Check blood sugar up to 3 times a day as instructed   carvedilol (COREG) 12.5 MG tablet Take 1 tablet (12.5 mg total) by mouth 2 (two) times daily with a meal.   colchicine 0.6 MG tablet Take 1 tablet (0.6 mg total) by mouth daily.   ezetimibe (ZETIA) 10 MG tablet Take 10 mg by mouth daily.   furosemide (LASIX) 20 MG tablet Take 2 tablets (40 mg total) by mouth daily. No refills   glucose blood test strip Check blood sugar up to 3 times a day as instructed   insulin isophane & regular human KwikPen (NOVOLIN 70/30 KWIKPEN) (70-30) 100  UNIT/ML KwikPen Inject 6 Units into the skin 2 (two) times daily before lunch and supper.   Insulin Pen Needle 32G X 4 MM MISC USE TO INJECT INSULIN DAILY   sacubitril-valsartan (ENTRESTO) 24-26 MG Take 1 tablet by mouth 2 (two) times daily.   spironolactone (ALDACTONE) 25 MG tablet Take 0.5 tablets (12.5 mg total) by mouth daily.     Allergies:   Penicillins, Strawberry (diagnostic), and Tomato   Social History   Socioeconomic History   Marital status: Married    Spouse name: Not on file   Number of children: Not on file   Years of education: Not on file   Highest education  level: Not on file  Occupational History   Not on file  Tobacco Use   Smoking status: Every Day    Packs/day: 0.50    Years: 40.00    Pack years: 20.00    Types: Cigarettes   Smokeless tobacco: Never  Vaping Use   Vaping Use: Never used  Substance and Sexual Activity   Alcohol use: Not Currently    Alcohol/week: 1.0 standard drink    Types: 1 Glasses of wine per week   Drug use: No   Sexual activity: Not Currently    Birth control/protection: Abstinence  Other Topics Concern   Not on file  Social History Narrative   Not on file   Social Determinants of Health   Financial Resource Strain: Not on file  Food Insecurity: Not on file  Transportation Needs: Not on file  Physical Activity: Not on file  Stress: Not on file  Social Connections: Not on file     Family History: The patient's family history includes Diabetes in her mother; Hypertension in her father and mother.  ROS:   Review of Systems  Constitution: Negative for decreased appetite, fever and weight gain.  HENT: Negative for congestion, ear discharge, hoarse voice and sore throat.   Eyes: Negative for discharge, redness, vision loss in right eye and visual halos.  Cardiovascular: Negative for chest pain, dyspnea on exertion, leg swelling, orthopnea and palpitations.  Respiratory: Negative for cough, hemoptysis, shortness of breath and snoring.   Endocrine: Negative for heat intolerance and polyphagia.  Hematologic/Lymphatic: Negative for bleeding problem. Does not bruise/bleed easily.  Skin: Negative for flushing, nail changes, rash and suspicious lesions.  Musculoskeletal: Negative for arthritis, joint pain, muscle cramps, myalgias, neck pain and stiffness.  Gastrointestinal: Negative for abdominal pain, bowel incontinence, diarrhea and excessive appetite.  Genitourinary: Negative for decreased libido, genital sores and incomplete emptying.  Neurological: Negative for brief paralysis, focal weakness, headaches  and loss of balance.  Psychiatric/Behavioral: Negative for altered mental status, depression and suicidal ideas.  Allergic/Immunologic: Negative for HIV exposure and persistent infections.    EKGs/Labs/Other Studies Reviewed:    The following studies were reviewed today:   EKG:  None today   Echo 2022 IMPRESSIONS   1. Moderate, predominantly anterior pericardial effusion; RA and mild RV collapse; no respiratory variation; IVC normal in size; compared to 05/08/21 LV function improved; pericardial effusion unchanged.   2. Left ventricular ejection fraction, by estimation, is 40 to 45%. The left ventricle has mildly decreased function. The left ventricle  demonstrates global hypokinesis. There is severe left ventricular hypertrophy.   3. Right ventricular systolic function is normal. The right ventricular size is normal. Tricuspid regurgitation signal is inadequate for assessing PA pressure.   4. Left atrial size was moderately dilated.   5. Moderate pericardial effusion.   6. Trivial  mitral valve regurgitation.   7. The aortic valve is tricuspid.   FINDINGS   Left Ventricle: Left ventricular ejection fraction, by estimation, is 40 to 45%. The left ventricle has mildly decreased function. The left  ventricle demonstrates global hypokinesis. The left ventricular internal  cavity size was normal in size. There is  severe left ventricular hypertrophy.   Right Ventricle: The right ventricular size is normal. Right ventricular systolic function is normal. Tricuspid regurgitation signal is inadequate for assessing PA pressure. The tricuspid regurgitant velocity is 2.66 m/s, and with an assumed right atrial   pressure of 3 mmHg, the estimated right ventricular systolic pressure is 86.5 mmHg.   Left Atrium: Left atrial size was moderately dilated.   Right Atrium: Right atrial size was normal in size.   Pericardium: A moderately sized pericardial effusion is present.   Mitral Valve: Trivial  mitral valve regurgitation.   Tricuspid Valve: Tricuspid valve regurgitation is trivial.   Aortic Valve: The aortic valve is tricuspid.   Pulmonic Valve: The pulmonic valve was normal in structure.   IAS/Shunts: No atrial level shunt detected by color flow Doppler.   Additional Comments: Moderate, predominantly anterior pericardial effusion; RA and mild RV collapse; no respiratory variation; IVC normal in size; compared to 05/08/21 LV function improved; pericardial effusion unchanged.  IVC IVC diam: 1.30 cm   NM CARDIAC AMYLOID TUMOR LOC INFLAM SPECT 1 DAY 05/09/2021 FINDINGS: Planar Visual assessment:   Anterior planar imaging demonstrates radiotracer uptake within the heart less than uptake within the adjacent ribs (Grade 1).   Quantitative assessment :   Quantitative assessment of the cardiac uptake compared to the contralateral chest wall is equal to 1.10 (H/CL = 1.10).   SPECT assessment: SPECT imaging of the chest demonstrates minimal radiotracer accumulation within the LEFT ventricle.   IMPRESSION: Visual and quantitative assessment (grade 1, H/CLL equal 1.10) are equivocal for transthyretin amyloidosis.    Recent Labs: 05/07/2021: B Natriuretic Peptide 648.5 05/08/2021: Magnesium 1.5 05/10/2021: BUN 16; Creatinine, Ser 1.05; Hemoglobin 9.9; Platelets 284; Potassium 3.9; Sodium 136  Recent Lipid Panel No results found for: CHOL, TRIG, HDL, CHOLHDL, VLDL, LDLCALC, LDLDIRECT  Physical Exam:    VS:  BP (!) 162/74   Pulse 82   Ht 5' 7.5" (1.715 m)   Wt 120 lb 3.2 oz (54.5 kg)   LMP 09/29/2011   SpO2 95%   BMI 18.55 kg/m     Wt Readings from Last 3 Encounters:  05/15/21 120 lb 3.2 oz (54.5 kg)  05/10/21 118 lb 1.6 oz (53.6 kg)  10/24/20 108 lb (49 kg)     GEN: Well nourished, well developed in no acute distress HEENT: Normal NECK: No JVD; No carotid bruits LYMPHATICS: No lymphadenopathy CARDIAC: S1S2 noted,RRR, no murmurs, rubs, gallops RESPIRATORY:   Clear to auscultation without rales, wheezing or rhonchi  ABDOMEN: Soft, non-tender, non-distended, +bowel sounds, no guarding. EXTREMITIES: No edema, No cyanosis, no clubbing MUSCULOSKELETAL:  No deformity  SKIN: Warm and dry NEUROLOGIC:  Alert and oriented x 3, non-focal PSYCHIATRIC:  Normal affect, good insight  ASSESSMENT:    1. Hypertensive heart disease with heart failure ([place])   2. Pericardial effusion   3. Heart failure with reduced ejection fraction ([place])   4. Hypertension, unspecified type   5. Type 2 diabetes mellitus with hyperosmolarity without coma, without long-term current use of insulin ([place])   6. Mixed hyperlipidemia    PLAN:    Clinically she appears to be improving on the diuretics.  We  will keep her on the current dose.  In terms of her cardiomyopathy there is still suspicion of probable infiltrative disease but I do suspect that hypertensive heart disease is playing a role here as well.  I will discuss her case with our heart failure team as her NM PYP scan is equivocal for transthyretin amyloidosis.  Her immunofixation for urine is normal, kappa and lambda free light chains are slightly elevated which does not really imply that this may be AL amyloid.  In the meantime, transition the patient to [facility] and stop the losartan.  Continue her Aldactone 12.5 mg daily, carvedilol 12.5 mg twice daily.  She may benefit from [place] but I will hold off on adding this medication from them.  Her diarrhea is tolerable we will continue the colchicine for now and if he gets worse unfortunately have to stop this medication.  We will get a repeat echocardiogram to reassess for pericardial fusion as well as her LV function.  If her EF has not improved I will refer her to our heart failure team.  Diabetes mellitus is being managed by her primary care doctor.  Will get blood work done for [employer] and mag.  The patient is in agreement with the above plan. The patient left the office in  stable condition.  The patient will follow up in 12 weeks or sooner if needed.   Medication Adjustments/Labs and Tests Ordered: Current medicines are reviewed at length with the patient today.  Concerns regarding medicines are outlined above.  Orders Placed This Encounter  Procedures   Basic metabolic panel   Magnesium   ECHOCARDIOGRAM COMPLETE   Meds ordered this encounter  Medications   sacubitril-valsartan (ENTRESTO) 24-26 MG    Sig: Take 1 tablet by mouth 2 (two) times daily.    Dispense:  180 tablet    Refill:  3    Patient Instructions  Medication Instructions:  Your physician has recommended you make the following change in your medication:  STOP: Losartan  START: Entresto 24-26 mg teice daily *If you need a refill on your cardiac medications before your next appointment, please call your pharmacy*   Lab Work: Your physician recommends that you return for lab work in:  TODAY: BMET, [place] If you have labs (blood work) drawn today and your tests are completely normal, you will receive your results only by: [place] (if you have MyChart) OR A paper copy in the mail If you have any lab test that is abnormal or we need to change your treatment, we will call you to review the results.   Testing/Procedures: Your physician has requested that you have an echocardiogram. Echocardiography is a painless test that uses sound waves to create images of your heart. It provides your doctor with information about the size and shape of your heart and how well your heart's chambers and valves are working. This procedure takes approximately one hour. There are no restrictions for this procedure.    Follow-Up: At [facility], you and your health needs are our priority.  As part of our continuing mission to provide you with exceptional heart care, we have created designated Provider Care Teams.  These Care Teams include your primary Cardiologist (physician) and Advanced Practice  Providers (APPs -  Physician Assistants and Nurse Practitioners) who all work together to provide you with the care you need, when you need it.  We recommend signing up for the patient portal called "MyChart".  Sign up information is provided on this  After Visit Summary.  MyChart is used to connect with patients for Virtual Visits (Telemedicine).  Patients are able to view lab/test results, encounter notes, upcoming appointments, etc.  Non-urgent messages can be sent to your provider as well.   To learn more about what you can do with MyChart, go to [email or web address].    Your next appointment:   12 week(s)  The format for your next appointment:   In Person  Provider:   Berniece Salines, DO [id number], [place], [place] [address]    Other Instructions Echocardiogram An echocardiogram is a test that uses sound waves (ultrasound) to produce images of the heart. Images from an echocardiogram can provide important information about: Heart size and shape. The size and thickness and movement of your heart's walls. Heart muscle function and strength. Heart valve function or if you have stenosis. Stenosis is when the heart valves are too narrow. If blood is flowing backward through the heart valves (regurgitation). A tumor or infectious growth around the heart valves. Areas of heart muscle that are not working well because of poor blood flow or injury from a heart attack. Aneurysm detection. An aneurysm is a weak or damaged part of an artery wall. The wall bulges out from the normal force of blood pumping through the body. Tell a health care provider about: Any allergies you have. All medicines you are taking, including vitamins, herbs, eye drops, creams, and over-the-counter medicines. Any blood disorders you have. Any surgeries you have had. Any medical conditions you have. Whether you are pregnant or may be pregnant. What are the risks? Generally, this is a safe test. However,  problems may occur, including an allergic reaction to dye (contrast) that may be used during the test. What happens before the test? No specific preparation is needed. You may eat and drink normally. What happens during the test?  You will take off your clothes from the waist up and put on a hospital gown. Electrodes or electrocardiogram (ECG)patches may be placed on your chest. The electrodes or patches are then connected to a device that monitors your heart rate and rhythm. You will lie down on a table for an ultrasound exam. A gel will be applied to your chest to help sound waves pass through your skin. A handheld device, called a transducer, will be pressed against your chest and moved over your heart. The transducer produces sound waves that travel to your heart and bounce back (or "echo" back) to the transducer. These sound waves will be captured in real-time and changed into images of your heart that can be viewed on a video monitor. The images will be recorded on a computer and reviewed by your health care provider. You may be asked to change positions or hold your breath for a short time. This makes it easier to get different views or better views of your heart. In some cases, you may receive contrast through an IV in one of your veins. This can improve the quality of the pictures from your heart. The procedure may vary among health care providers and hospitals. What can I expect after the test? You may return to your normal, everyday life, including diet, activities, and medicines, unless your health care provider tells you not to do that. Follow these instructions at home: It is up to you to get the results of your test. Ask your health care provider, or the department that is doing the test, when your results will be ready. Keep all follow-up  visits. This is important. Summary An echocardiogram is a test that uses sound waves (ultrasound) to produce images of the heart. Images from an  echocardiogram can provide important information about the size and shape of your heart, heart muscle function, heart valve function, and other possible heart problems. You do not need to do anything to prepare before this test. You may eat and drink normally. After the echocardiogram is completed, you may return to your normal, everyday life, unless your health care provider tells you not to do that. This information is not intended to replace advice given to you by your health care provider. Make sure you discuss any questions you have with your health care provider. Document Revised: 03/07/2020 Document Reviewed: 03/07/2020 Elsevier Patient Education  2022 [employer].     Adopting a Healthy Lifestyle.  Know what a healthy weight is for you (roughly BMI <25) and aim to maintain this   Aim for 7+ servings of fruits and vegetables daily   65-80+ fluid ounces of water or unsweet tea for healthy kidneys   Limit to max 1 drink of alcohol per day; avoid smoking/tobacco   Limit animal fats in diet for cholesterol and heart health - choose grass fed whenever available   Avoid highly processed foods, and foods high in saturated/trans fats   Aim for low stress - take time to unwind and care for your mental health   Aim for 150 min of moderate intensity exercise weekly for heart health, and weights twice weekly for bone health   Aim for 7-9 hours of sleep daily   When it comes to diets, agreement about the perfect plan isnt easy to find, even among the experts. Experts at the [place] developed an idea known as the Healthy Eating Plate. Just imagine a plate divided into logical, healthy portions.   The emphasis is on diet quality:   Load up on vegetables and fruits - one-half of your plate: Aim for color and variety, and remember that potatoes dont count.   Go for whole grains - one-quarter of your plate: Whole wheat, barley, wheat berries, quinoa, oats, brown  rice, and foods made with them. If you want pasta, go with whole wheat pasta.   Protein power - one-quarter of your plate: Fish, chicken, beans, and nuts are all healthy, versatile protein sources. Limit red meat.   The diet, however, does go beyond the plate, offering a few other suggestions.   Use healthy plant oils, such as olive, canola, soy, corn, sunflower and peanut. Check the labels, and avoid partially hydrogenated oil, which have unhealthy trans fats.   If youre thirsty, drink water. Coffee and tea are good in moderation, but skip sugary drinks and limit milk and dairy products to one or two daily servings.   The type of carbohydrate in the diet is more important than the amount. Some sources of carbohydrates, such as vegetables, fruits, whole grains, and beans-are healthier than others.   Finally, stay active  Signed, Berniece Salines, DO  05/15/2021 3:45 PM    [facility]

## 2021-05-16 ENCOUNTER — Telehealth: Payer: Self-pay | Admitting: Cardiology

## 2021-05-16 LAB — BASIC METABOLIC PANEL
BUN/Creatinine Ratio: 17 (ref 9–23)
BUN: 24 mg/dL (ref 6–24)
CO2: 26 mmol/L (ref 20–29)
Calcium: 9 mg/dL (ref 8.7–10.2)
Chloride: 99 mmol/L (ref 96–106)
Creatinine, Ser: 1.44 mg/dL — ABNORMAL HIGH (ref 0.57–1.00)
Glucose: 441 mg/dL — ABNORMAL HIGH (ref 70–99)
Potassium: 4.9 mmol/L (ref 3.5–5.2)
Sodium: 140 mmol/L (ref 134–144)
eGFR: 42 mL/min/{1.73_m2} — ABNORMAL LOW (ref >59)

## 2021-05-16 LAB — MAGNESIUM: Magnesium: 1.8 mg/dL (ref 1.6–2.3)

## 2021-05-16 NOTE — Telephone Encounter (Signed)
Pt c/o medication issue:  1. Name of Medication:  carvedilol (COREG) 12.5 MG tablet  2. How are you currently taking this medication (dosage and times per day)?  12.5 MG once daily by mouth  3. Are you having a reaction (difficulty breathing--STAT)?  See below   4. What is your medication issue?   Patient wanted to follow up with Dr. Harriet Masson regarding symptoms discussed during her appointment yesterday, 10/18. She states this medication is still causing extreme diarrhea. States she is currently on her way home due to having an accident at work. She would like to know if there is an alternative she can take.  Please advise.

## 2021-05-16 NOTE — Telephone Encounter (Signed)
Called patient- she states that she had another episode of diarrhea today and caused her to have to leave work due to an accident she had.  Patient states that she is not sure where it is coming from but she would like to stop something to see if it helps-  I seen in MD note she believes could be from colchicine, but patient is requesting to try anything at this point. She states she does not have any warning, or abdominal pain, but it just happens.  I advised I would route to MD to make aware and we would call back with any recommendations.

## 2021-05-17 ENCOUNTER — Other Ambulatory Visit: Payer: Self-pay

## 2021-05-17 MED ORDER — FUROSEMIDE 20 MG PO TABS: 40.0000 mg | ORAL_TABLET | ORAL | 0 refills | Status: DC

## 2021-05-17 NOTE — Telephone Encounter (Signed)
Called pt to let her know Dr. Terrial Rhodes recommendations. She verbalized understanding.

## 2021-05-22 ENCOUNTER — Telehealth: Payer: Self-pay | Admitting: Cardiology

## 2021-05-22 NOTE — Telephone Encounter (Signed)
Pt c/o BP issue: STAT if pt c/o blurred vision, one-sided weakness or slurred speech  1. What are your last 5 BP readings? 180/100, 160/90... doesn't have the other readings with her but her blood sugar is 522, yesterday it was 575  2. Are you having any other symptoms (ex. Dizziness, headache, blurred vision, passed out)? Feels faint   3. What is your BP issue? Pts blood pressure is a concern but states she is mainly concerned about her sugar... pt says that pcp will not see her due to her having three other doctors with [facility]...Marland Kitchen please advise

## 2021-05-22 NOTE — Telephone Encounter (Signed)
Left message to call back  

## 2021-05-23 ENCOUNTER — Other Ambulatory Visit (HOSPITAL_COMMUNITY): Payer: Self-pay

## 2021-05-25 ENCOUNTER — Ambulatory Visit: Payer: BLUE CROSS/BLUE SHIELD | Admitting: Physician Assistant

## 2021-05-30 DIAGNOSIS — R809 Proteinuria, unspecified: Secondary | ICD-10-CM | POA: Insufficient documentation

## 2021-05-30 DIAGNOSIS — N1831 Chronic kidney disease, stage 3a: Secondary | ICD-10-CM | POA: Insufficient documentation

## 2021-06-01 ENCOUNTER — Other Ambulatory Visit (HOSPITAL_COMMUNITY): Payer: BLUE CROSS/BLUE SHIELD

## 2021-06-05 ENCOUNTER — Ambulatory Visit: Payer: BLUE CROSS/BLUE SHIELD | Admitting: Cardiology

## 2021-08-02 DIAGNOSIS — R768 Other specified abnormal immunological findings in serum: Secondary | ICD-10-CM | POA: Insufficient documentation

## 2021-08-02 DIAGNOSIS — R700 Elevated erythrocyte sedimentation rate: Secondary | ICD-10-CM

## 2021-08-03 ENCOUNTER — Ambulatory Visit (HOSPITAL_COMMUNITY): Payer: BLUE CROSS/BLUE SHIELD | Attending: Cardiology

## 2021-08-03 ENCOUNTER — Encounter (HOSPITAL_COMMUNITY): Payer: Self-pay | Admitting: Cardiology

## 2021-08-14 ENCOUNTER — Ambulatory Visit: Payer: BLUE CROSS/BLUE SHIELD | Admitting: Cardiology

## 2021-09-03 DIAGNOSIS — Z659 Problem related to unspecified psychosocial circumstances: Secondary | ICD-10-CM | POA: Insufficient documentation

## 2021-09-03 DIAGNOSIS — Z91148 Patient's other noncompliance with medication regimen for other reason: Secondary | ICD-10-CM | POA: Insufficient documentation

## 2021-09-06 DIAGNOSIS — Z780 Asymptomatic menopausal state: Secondary | ICD-10-CM | POA: Insufficient documentation

## 2021-10-04 DIAGNOSIS — I129 Hypertensive chronic kidney disease with stage 1 through stage 4 chronic kidney disease, or unspecified chronic kidney disease: Secondary | ICD-10-CM | POA: Insufficient documentation

## 2021-10-25 ENCOUNTER — Emergency Department (HOSPITAL_BASED_OUTPATIENT_CLINIC_OR_DEPARTMENT_OTHER)
Admission: EM | Admit: 2021-10-25 | Discharge: 2021-10-25 | Disposition: A | Discharge status: Home / Self Care | Payer: Managed Care, Other (non HMO) | Attending: Emergency Medicine | Admitting: Emergency Medicine

## 2021-10-25 ENCOUNTER — Other Ambulatory Visit: Payer: Self-pay

## 2021-10-25 ENCOUNTER — Emergency Department (HOSPITAL_BASED_OUTPATIENT_CLINIC_OR_DEPARTMENT_OTHER): Payer: Managed Care, Other (non HMO)

## 2021-10-25 ENCOUNTER — Encounter (HOSPITAL_BASED_OUTPATIENT_CLINIC_OR_DEPARTMENT_OTHER): Payer: Self-pay | Admitting: Emergency Medicine

## 2021-10-25 DIAGNOSIS — F17200 Nicotine dependence, unspecified, uncomplicated: Secondary | ICD-10-CM | POA: Diagnosis not present

## 2021-10-25 DIAGNOSIS — D649 Anemia, unspecified: Secondary | ICD-10-CM | POA: Diagnosis not present

## 2021-10-25 DIAGNOSIS — E1122 Type 2 diabetes mellitus with diabetic chronic kidney disease: Secondary | ICD-10-CM | POA: Insufficient documentation

## 2021-10-25 DIAGNOSIS — I129 Hypertensive chronic kidney disease with stage 1 through stage 4 chronic kidney disease, or unspecified chronic kidney disease: Secondary | ICD-10-CM | POA: Diagnosis not present

## 2021-10-25 DIAGNOSIS — R079 Chest pain, unspecified: Secondary | ICD-10-CM | POA: Diagnosis present

## 2021-10-25 DIAGNOSIS — R072 Precordial pain: Secondary | ICD-10-CM | POA: Diagnosis not present

## 2021-10-25 DIAGNOSIS — N189 Chronic kidney disease, unspecified: Secondary | ICD-10-CM | POA: Insufficient documentation

## 2021-10-25 DIAGNOSIS — R918 Other nonspecific abnormal finding of lung field: Secondary | ICD-10-CM | POA: Insufficient documentation

## 2021-10-25 LAB — CBC
HCT: 27.3 % — ABNORMAL LOW (ref 36.0–46.0)
Hemoglobin: 9.3 g/dL — ABNORMAL LOW (ref 12.0–15.0)
MCH: 29.2 pg (ref 26.0–34.0)
MCHC: 34.1 g/dL (ref 30.0–36.0)
MCV: 85.8 fL (ref 80.0–100.0)
Platelets: 327 10*3/uL (ref 150–400)
RBC: 3.18 MIL/uL — ABNORMAL LOW (ref 3.87–5.11)
RDW: 14.1 % (ref 11.5–15.5)
WBC: 10.6 10*3/uL — ABNORMAL HIGH (ref 4.0–10.5)
nRBC: 0 % (ref 0.0–0.2)

## 2021-10-25 LAB — BASIC METABOLIC PANEL
Anion gap: 9 (ref 5–15)
BUN: 30 mg/dL — ABNORMAL HIGH (ref 6–20)
CO2: 22 mmol/L (ref 22–32)
Calcium: 8 mg/dL — ABNORMAL LOW (ref 8.9–10.3)
Chloride: 105 mmol/L (ref 98–111)
Creatinine, Ser: 1.73 mg/dL — ABNORMAL HIGH (ref 0.44–1.00)
GFR, Estimated: 34 mL/min — ABNORMAL LOW (ref >60)
Glucose, Bld: 334 mg/dL — ABNORMAL HIGH (ref 70–99)
Potassium: 3.3 mmol/L — ABNORMAL LOW (ref 3.5–5.1)
Sodium: 136 mmol/L (ref 135–145)

## 2021-10-25 LAB — TROPONIN I (HIGH SENSITIVITY)
Troponin I (High Sensitivity): 18 ng/L — ABNORMAL HIGH (ref <18)
Troponin I (High Sensitivity): 19 ng/L — ABNORMAL HIGH (ref <18)

## 2021-10-25 IMAGING — CR DG CHEST 2V
2 series · 2 of 2 positions shown · non-contrast
Comparison: Chest two views [DATE] at [DATE] p.m.; CT chest
[DATE]

CLINICAL DATA: Chest pain.

EXAM:
CHEST - 2 VIEW

[w chest pa]
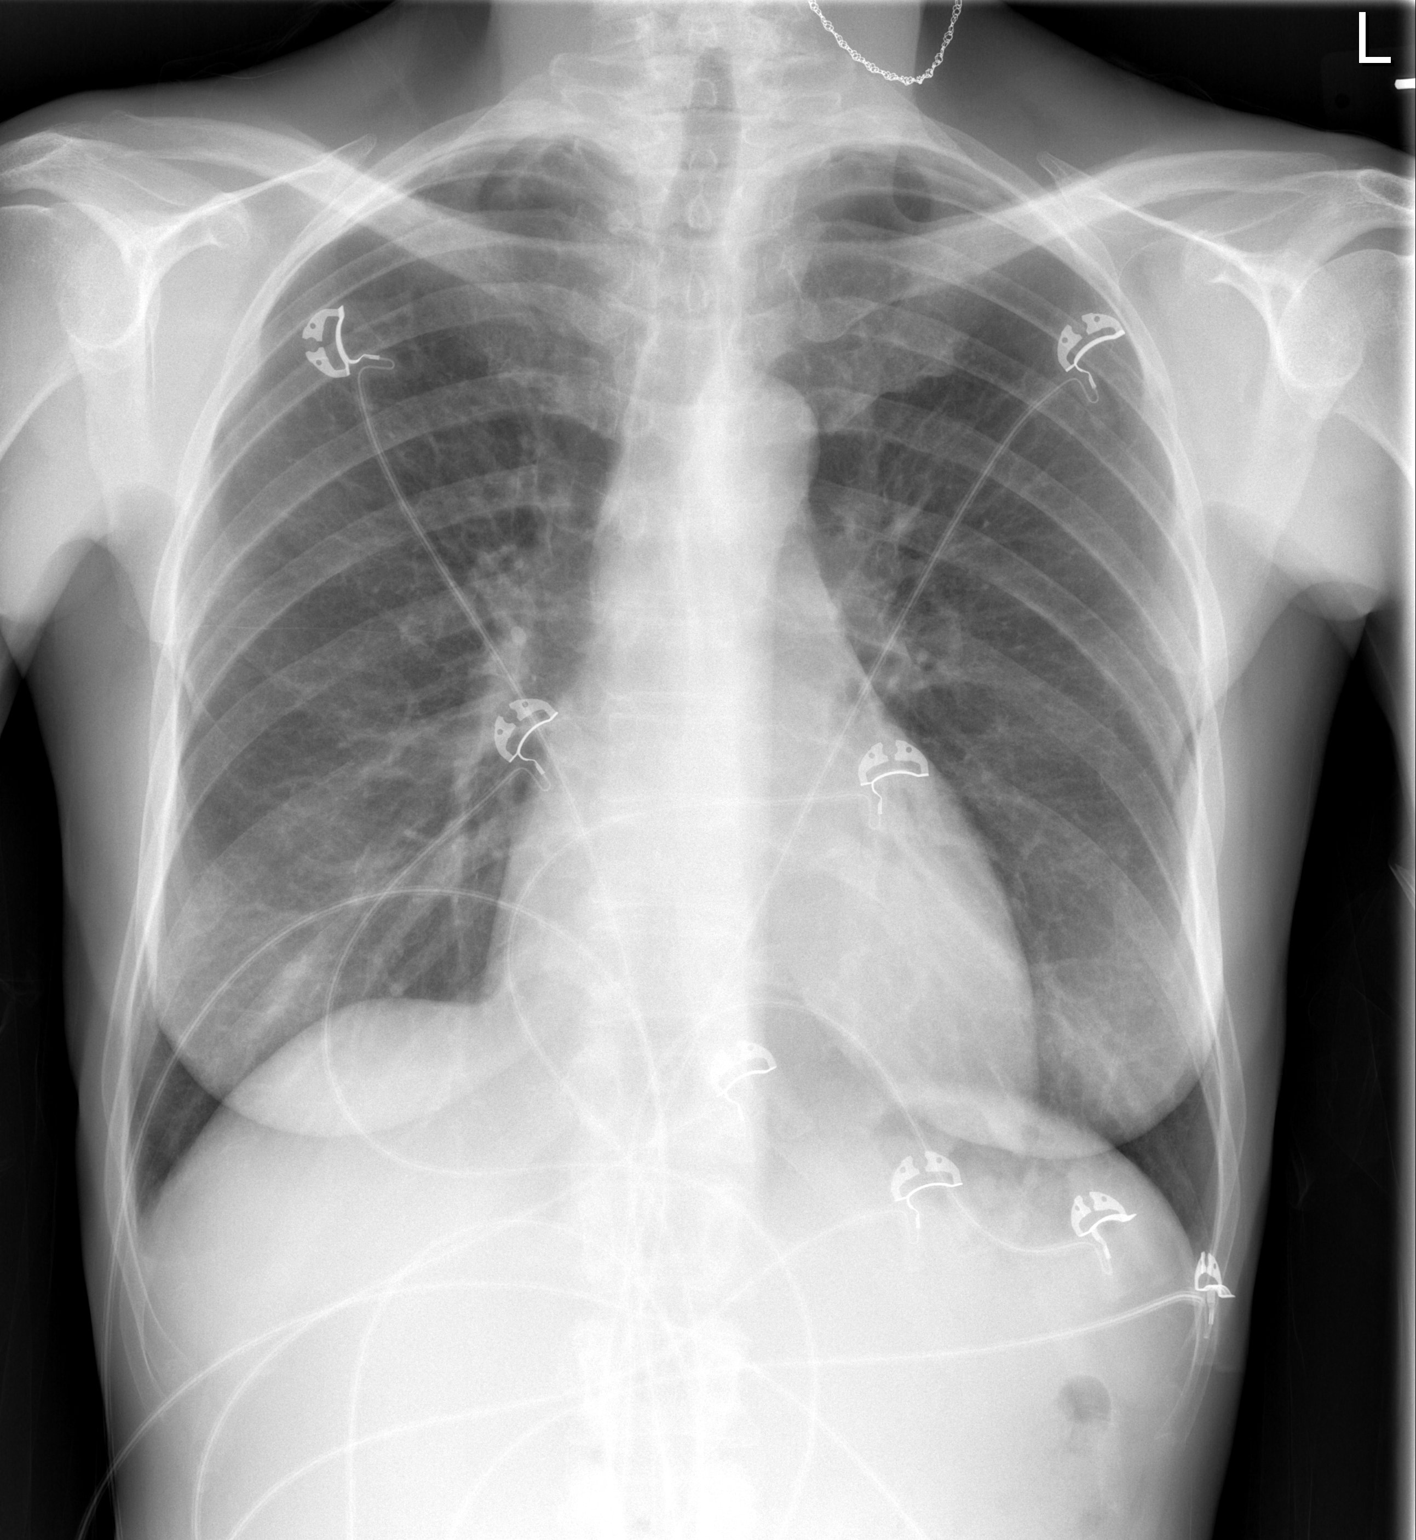

[w chest lat]
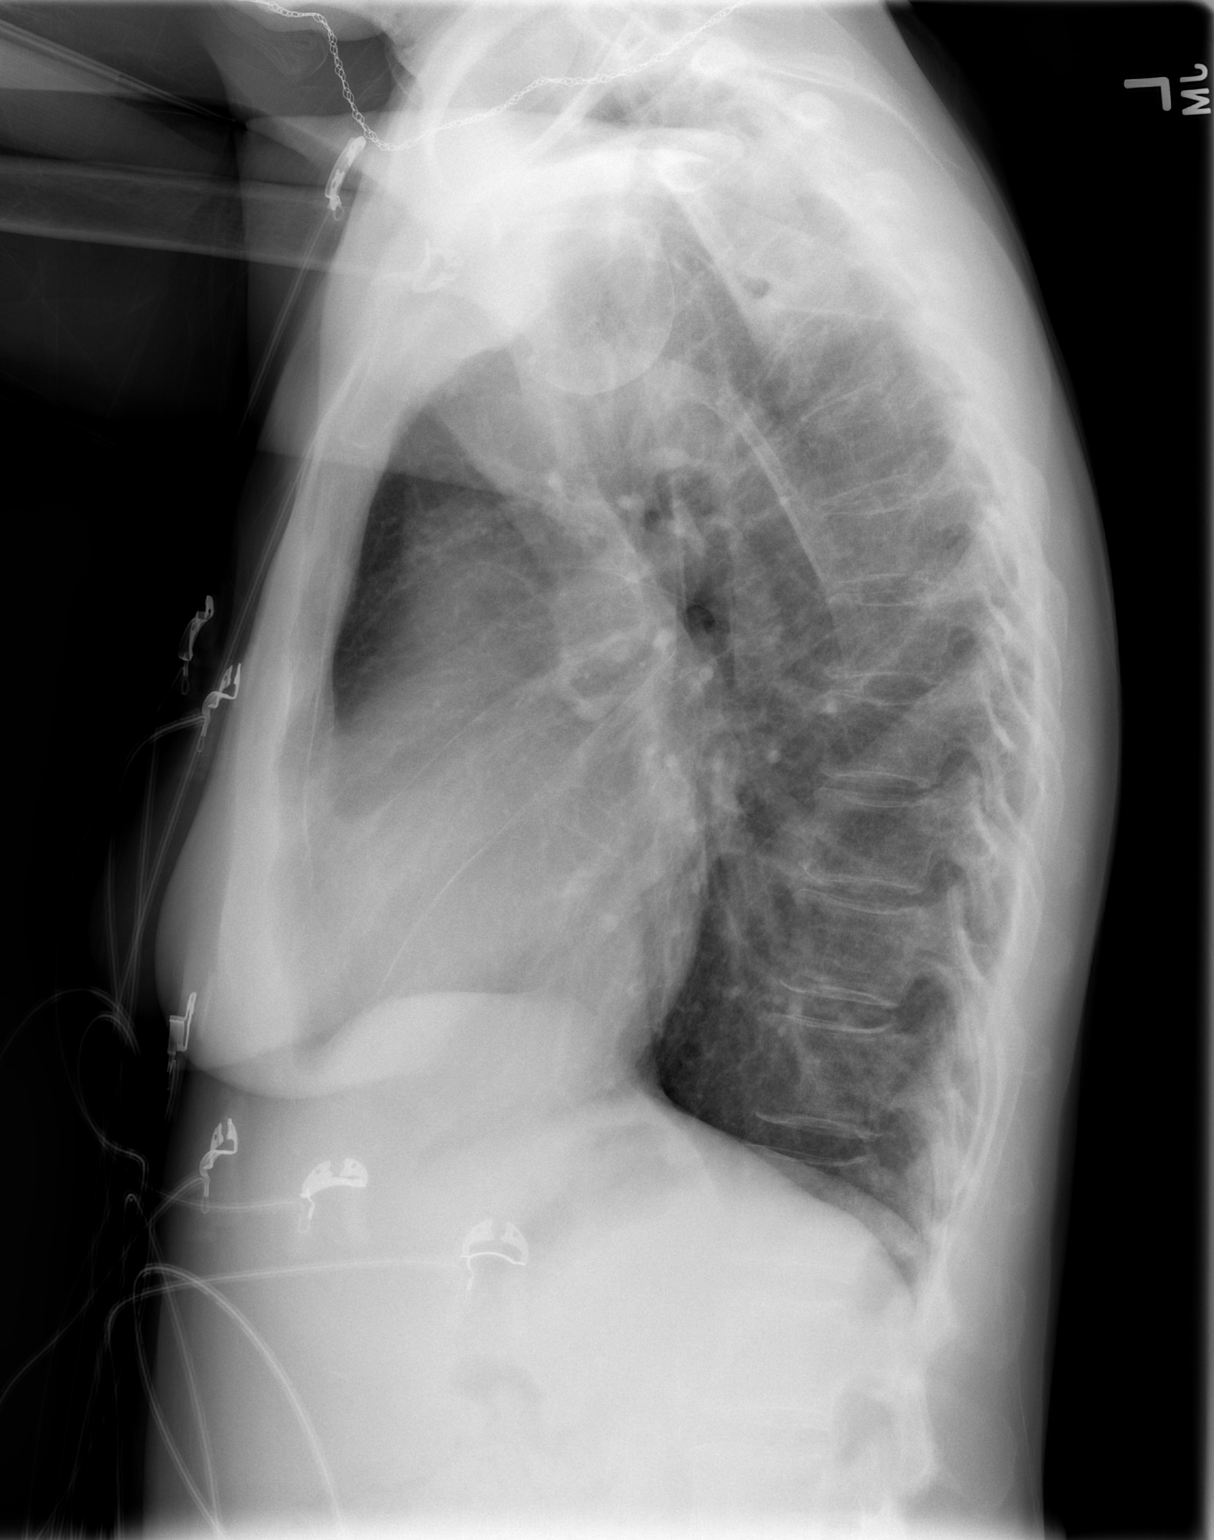

[2 of 2 positions shown; findings below may reference images not displayed]

FINDINGS: Chest two views [DATE] at [DATE] p.m.

Cardiac silhouette is again mildly enlarged. Mediastinal contours
are within normal limits. There is mild hyperinflation. Increased
lucencies within the upper lungs consistent with centrilobular
emphysematous changes also seen on [DATE] CT.

A posterosuperior aspect of the right upper lobe 9 mm nodule seen on
recent [DATE] CT is partially visualized on frontal radiograph.
Mild bilateral interstitial thickening appears chronic. No definite
focal airspace opacity to indicate pneumonia.

No pleural effusion or pneumothorax.

Mild multilevel degenerative disc changes of the thoracic spine.
IMPRESSION: Mild bibasilar linear opacities on frontal view appear to represent
a combination of density from overlying breast parenchyma and mild
interstitial thickening/scarring. This is very similar to multiple
prior radiographs and prior CT. No acute lung process is seen.

## 2021-10-25 MED ORDER — ONDANSETRON HCL 4 MG/2ML IJ SOLN
4.0000 mg | Freq: Once | INTRAMUSCULAR | Status: AC
  Administered 2021-10-25: 4 mg via INTRAVENOUS
  Filled 2021-10-25: qty 2

## 2021-10-25 MED ORDER — MORPHINE SULFATE (PF) 4 MG/ML IV SOLN
4.0000 mg | Freq: Once | INTRAVENOUS | Status: AC
Start: 2021-10-25 — End: 2021-10-25
  Administered 2021-10-25: 4 mg via INTRAVENOUS
  Filled 2021-10-25: qty 1

## 2021-10-25 MED ORDER — SENNOSIDES-DOCUSATE SODIUM 8.6-50 MG PO TABS: 1.0000 | ORAL_TABLET | Freq: Every evening | ORAL | 0 refills | Status: DC | PRN

## 2021-10-25 MED ORDER — OXYCODONE-ACETAMINOPHEN 5-325 MG PO TABS
1.0000 | ORAL_TABLET | Freq: Four times a day (QID) | ORAL | 0 refills | Status: DC | PRN
Start: 2021-10-25 — End: 2021-11-23

## 2021-10-25 MED ORDER — ONDANSETRON 4 MG PO TBDP: 4.0000 mg | ORAL_TABLET | Freq: Three times a day (TID) | ORAL | 0 refills | Status: DC | PRN

## 2021-10-25 NOTE — ED Provider Notes (Signed)
? ?Emergency Department Provider Note ? ? ?I have reviewed the triage vital signs and the nursing notes. ? ? ?HISTORY ? ?Chief Complaint ?Chest Pain ? ? ?HPI ?Amber Cross is a 61 y.o. female with PMH of DM, HTN, and HLD presents to the emergency department for evaluation of chest pain.  Patient has had 3-weeks  of central chest pain. She notes a recent chest mass being found and has called progressive pain.  The pain is keeping her awake at night and ultimately prompted her to go to [facility] for evaluation.  There was a Shacarra Choe wait in the ED there and so she presents here for evaluation.  She is not experiencing hemoptysis.  No pleuritic pain.  No difficulty swallowing or vomiting.  She was scheduled to have a PET scan today but canceled that appointment due to being in the ED. ? ? ?Past Medical History:  ?Diagnosis Date  ? Diabetes mellitus   ? Hypercholesteremia   ? Hypertension   ? Tobacco abuse   ? ? ?Review of Systems ? ?Constitutional: No fever/chills ?Eyes: No visual changes. ?ENT: No sore throat. ?Cardiovascular: Positive chest pain. ?Respiratory: Denies shortness of breath. ?Gastrointestinal: No abdominal pain. No nausea, no vomiting.  No diarrhea.  No constipation. ?Genitourinary: Negative for dysuria. ?Musculoskeletal: Negative for back pain. ?Skin: Negative for rash. ?Neurological: Negative for headaches. ?____________________________________________ ? ? ?PHYSICAL EXAM: ? ?VITAL SIGNS: ?ED Triage Vitals  ?Enc Vitals Group  ?   BP 10/25/21 1422 (!) 220/96  ?   Pulse Rate 10/25/21 1422 97  ?   Resp 10/25/21 1422 18  ?   Temp 10/25/21 1422 98.2 ?F (36.8 ?C)  ?   Temp Source 10/25/21 1422 Oral  ?   SpO2 10/25/21 1422 100 %  ?   Weight 10/25/21 1421 120 lb (54.4 kg)  ?   Height 10/25/21 1421 5\' 7"  (1.702 m)  ? ?Constitutional: Alert and oriented. Well appearing and in no acute distress. ?Eyes: Conjunctivae are normal.  ?Head: Atraumatic. ?Nose: No congestion/rhinnorhea. ?Mouth/Throat: Mucous  membranes are moist.   ?Neck: No stridor.   ?Cardiovascular: Normal rate, regular rhythm. Good peripheral circulation. Grossly normal heart sounds.   ?Respiratory: Normal respiratory effort.  No retractions. Lungs CTAB. ?Gastrointestinal: Soft and nontender. No distention.  ?Musculoskeletal: No gross deformities of extremities. ?Neurologic:  Normal speech and language. ?Skin:  Skin is warm, dry and intact. No rash noted. ? ?____________________________________________ ?  ?LABS ?(all labs ordered are listed, but only abnormal results are displayed) ? ?Labs Reviewed  ?BASIC METABOLIC PANEL - Abnormal; Notable for the following components:  ?    Result Value  ? Potassium 3.3 (*)   ? Glucose, Bld 334 (*)   ? BUN 30 (*)   ? Creatinine, Ser 1.73 (*)   ? Calcium 8.0 (*)   ? GFR, Estimated 34 (*)   ? All other components within normal limits  ?CBC - Abnormal; Notable for the following components:  ? WBC 10.6 (*)   ? RBC 3.18 (*)   ? Hemoglobin 9.3 (*)   ? HCT 27.3 (*)   ? All other components within normal limits  ?TROPONIN I (HIGH SENSITIVITY) - Abnormal; Notable for the following components:  ? Troponin I (High Sensitivity) 18 (*)   ? All other components within normal limits  ?TROPONIN I (HIGH SENSITIVITY) - Abnormal; Notable for the following components:  ? Troponin I (High Sensitivity) 19 (*)   ? All other components within normal limits  ? ?  ____________________________________________ ? ?EKG ? ? EKG Interpretation ? ?Date/Time:  Thursday October 25 2021 14:33:22 EDT ?Ventricular Rate:  93 ?PR Interval:  188 ?QRS Duration: 94 ?QT Interval:  389 ?QTC Calculation: 484 ?R Axis:   17 ?Text Interpretation: Sinus rhythm Left ventricular hypertrophy Anterior infarct, old Artifact in lead(s) I III aVR aVL aVF V1 V2 and baseline wander in lead(s) V1 Similar to Oct 2022 tracing Confirmed by Nanda Quinton [phone]) on 10/25/2021 2:55:40 PM ?  ? ?  ? ? ? ?____________________________________________ ? ? ?PROCEDURES ? ?Procedure(s)  performed:  ? ?Procedures ? ?None ?____________________________________________ ? ? ?INITIAL IMPRESSION / ASSESSMENT AND PLAN / ED COURSE ? ?Pertinent labs & imaging results that were available during my care of the patient were reviewed by me and considered in my medical decision making (see chart for details). ?  ?This patient is Presenting for Evaluation of CP, which does require a range of treatment options, and is a complaint that involves a high risk of morbidity and mortality. ? ?The Differential Diagnoses includes but is not exclusive to acute coronary syndrome, aortic dissection, pulmonary embolism, cardiac tamponade, community-acquired pneumonia, pericarditis, musculoskeletal chest wall pain, etc. ? ? ? ?I did obtain Additional Historical Information from son at bedside. ?  ?Clinical Laboratory Tests Ordered, included troponin 19 but no significant change on delta. CKD noted. Mild anemia.  ? ?Radiologic Tests Ordered, included CXR. I independently interpreted the images and agree with radiology interpretation.  ? ?Cardiac Monitor Tracing which shows NSR ? ? ?Social Determinants of Health Risk patient with a smoking history.  ? ? ?Medical Decision Making: Summary:  ?Patient presents to the ED with CP. Pain is familiar to the patient from the last 3 weeks. CXR with similar appearance of chest mass. Troponin flat on delta. Patient is care and encouraged coordination with team at [facility] for PET scan and outpatient w/u of mass. Patient more comfortable here.  ? ?Reevaluation with update and discussion with patient and family ata bedside. Discussed results and ED return precautions.  ? ?Disposition: discharge ? ?____________________________________________ ? ?FINAL CLINICAL IMPRESSION(S) / ED DIAGNOSES ? ?Final diagnoses:  ?Precordial chest pain  ?Lung mass  ? ? ? ?NEW OUTPATIENT MEDICATIONS STARTED DURING THIS VISIT: ? ?Discharge Medication List as of 10/25/2021  5:32 PM  ?  ? ?START taking these medications  ?  Details  ?ondansetron (ZOFRAN-ODT) 4 MG disintegrating tablet Take 1 tablet (4 mg total) by mouth every 8 (eight) hours as needed., Starting Thu 10/25/2021, Normal  ?  ?oxyCODONE-acetaminophen (PERCOCET/ROXICET) 5-325 MG tablet Take 1 tablet by mouth every 6 (six) hours as needed for severe pain., Starting Thu 10/25/2021, Normal  ?  ?senna-docusate (SENOKOT-S) 8.6-50 MG tablet Take 1 tablet by mouth at bedtime as needed for mild constipation., Starting Thu 10/25/2021, Normal  ?  ?  ? ? ?Note:  This document was prepared using Dragon voice recognition software and may include unintentional dictation errors. ? ?Nanda Quinton, MD, FACEP ?Emergency Medicine ? ?  ?Margette Fast, MD ?10/30/21 [phone] ? ?

## 2021-10-25 NOTE — ED Notes (Signed)
Patient transported to X-ray 

## 2021-10-25 NOTE — ED Notes (Signed)
Patients son adds patient was over at [facility] and had EKG with labs done however was waiting to be seen so they left to come here. Patient also had appt for PET scan this afternoon.  ?

## 2021-10-25 NOTE — Discharge Instructions (Signed)
You have been seen in the Emergency Department (ED) today for chest pain.  As we have discussed today?s test results are normal, but you may require further testing. ? ?Please follow up with the recommended doctor as instructed above in these documents regarding today?s emergent visit and your recent symptoms to discuss further management.  Continue to take your regular medications.  ? ?Return to the Emergency Department (ED) if you experience any further chest pain/pressure/tightness, difficulty breathing, or sudden sweating, or other symptoms that concern you. ? ? ? ?

## 2021-10-25 NOTE — ED Triage Notes (Signed)
Patient c/o generalized chest pain x 3 weeks. Describes it as wearing too tight of a bra. Radiates into back area. Has not taken BP medication today.  ?

## 2021-10-27 DIAGNOSIS — I639 Cerebral infarction, unspecified: Secondary | ICD-10-CM

## 2021-10-27 HISTORY — DX: Cerebral infarction, unspecified: I63.9

## 2021-11-06 ENCOUNTER — Emergency Department (HOSPITAL_BASED_OUTPATIENT_CLINIC_OR_DEPARTMENT_OTHER): Payer: 59

## 2021-11-06 ENCOUNTER — Encounter (HOSPITAL_BASED_OUTPATIENT_CLINIC_OR_DEPARTMENT_OTHER): Payer: Self-pay

## 2021-11-06 ENCOUNTER — Other Ambulatory Visit: Payer: Self-pay

## 2021-11-06 ENCOUNTER — Inpatient Hospital Stay (HOSPITAL_BASED_OUTPATIENT_CLINIC_OR_DEPARTMENT_OTHER)
Admission: EM | Admit: 2021-11-06 | Discharge: 2021-11-09 | DRG: 683 | Disposition: A | Discharge status: Home / Self Care | Payer: 59 | Attending: Internal Medicine | Admitting: Internal Medicine

## 2021-11-06 DIAGNOSIS — R042 Hemoptysis: Secondary | ICD-10-CM | POA: Diagnosis present

## 2021-11-06 DIAGNOSIS — E1122 Type 2 diabetes mellitus with diabetic chronic kidney disease: Secondary | ICD-10-CM | POA: Diagnosis present

## 2021-11-06 DIAGNOSIS — R809 Proteinuria, unspecified: Secondary | ICD-10-CM | POA: Diagnosis present

## 2021-11-06 DIAGNOSIS — R778 Other specified abnormalities of plasma proteins: Secondary | ICD-10-CM | POA: Diagnosis present

## 2021-11-06 DIAGNOSIS — F419 Anxiety disorder, unspecified: Secondary | ICD-10-CM | POA: Diagnosis present

## 2021-11-06 DIAGNOSIS — E785 Hyperlipidemia, unspecified: Secondary | ICD-10-CM | POA: Diagnosis present

## 2021-11-06 DIAGNOSIS — Z90721 Acquired absence of ovaries, unilateral: Secondary | ICD-10-CM

## 2021-11-06 DIAGNOSIS — Z794 Long term (current) use of insulin: Secondary | ICD-10-CM

## 2021-11-06 DIAGNOSIS — R911 Solitary pulmonary nodule: Secondary | ICD-10-CM

## 2021-11-06 DIAGNOSIS — A599 Trichomoniasis, unspecified: Secondary | ICD-10-CM | POA: Diagnosis present

## 2021-11-06 DIAGNOSIS — E876 Hypokalemia: Secondary | ICD-10-CM | POA: Diagnosis not present

## 2021-11-06 DIAGNOSIS — G8929 Other chronic pain: Secondary | ICD-10-CM | POA: Diagnosis present

## 2021-11-06 DIAGNOSIS — Z79899 Other long term (current) drug therapy: Secondary | ICD-10-CM

## 2021-11-06 DIAGNOSIS — E1142 Type 2 diabetes mellitus with diabetic polyneuropathy: Secondary | ICD-10-CM | POA: Diagnosis present

## 2021-11-06 DIAGNOSIS — I130 Hypertensive heart and chronic kidney disease with heart failure and stage 1 through stage 4 chronic kidney disease, or unspecified chronic kidney disease: Secondary | ICD-10-CM | POA: Diagnosis present

## 2021-11-06 DIAGNOSIS — E7800 Pure hypercholesterolemia, unspecified: Secondary | ICD-10-CM | POA: Diagnosis present

## 2021-11-06 DIAGNOSIS — Z880 Allergy status to penicillin: Secondary | ICD-10-CM

## 2021-11-06 DIAGNOSIS — Z20822 Contact with and (suspected) exposure to covid-19: Secondary | ICD-10-CM | POA: Diagnosis present

## 2021-11-06 DIAGNOSIS — D631 Anemia in chronic kidney disease: Secondary | ICD-10-CM | POA: Diagnosis present

## 2021-11-06 DIAGNOSIS — E1165 Type 2 diabetes mellitus with hyperglycemia: Secondary | ICD-10-CM | POA: Diagnosis present

## 2021-11-06 DIAGNOSIS — E860 Dehydration: Secondary | ICD-10-CM | POA: Diagnosis present

## 2021-11-06 DIAGNOSIS — N179 Acute kidney failure, unspecified: Principal | ICD-10-CM | POA: Diagnosis present

## 2021-11-06 DIAGNOSIS — Z720 Tobacco use: Secondary | ICD-10-CM | POA: Diagnosis present

## 2021-11-06 DIAGNOSIS — Z833 Family history of diabetes mellitus: Secondary | ICD-10-CM

## 2021-11-06 DIAGNOSIS — D649 Anemia, unspecified: Secondary | ICD-10-CM

## 2021-11-06 DIAGNOSIS — Z91018 Allergy to other foods: Secondary | ICD-10-CM

## 2021-11-06 DIAGNOSIS — Z8249 Family history of ischemic heart disease and other diseases of the circulatory system: Secondary | ICD-10-CM

## 2021-11-06 DIAGNOSIS — I5043 Acute on chronic combined systolic (congestive) and diastolic (congestive) heart failure: Secondary | ICD-10-CM

## 2021-11-06 DIAGNOSIS — I10 Essential (primary) hypertension: Secondary | ICD-10-CM | POA: Diagnosis present

## 2021-11-06 DIAGNOSIS — I5023 Acute on chronic systolic (congestive) heart failure: Secondary | ICD-10-CM

## 2021-11-06 DIAGNOSIS — E611 Iron deficiency: Secondary | ICD-10-CM | POA: Diagnosis present

## 2021-11-06 DIAGNOSIS — R918 Other nonspecific abnormal finding of lung field: Secondary | ICD-10-CM | POA: Diagnosis present

## 2021-11-06 DIAGNOSIS — F17210 Nicotine dependence, cigarettes, uncomplicated: Secondary | ICD-10-CM | POA: Diagnosis present

## 2021-11-06 DIAGNOSIS — I5022 Chronic systolic (congestive) heart failure: Secondary | ICD-10-CM | POA: Diagnosis present

## 2021-11-06 DIAGNOSIS — E1100 Type 2 diabetes mellitus with hyperosmolarity without nonketotic hyperglycemic-hyperosmolar coma (NKHHC): Secondary | ICD-10-CM

## 2021-11-06 DIAGNOSIS — R079 Chest pain, unspecified: Secondary | ICD-10-CM | POA: Diagnosis present

## 2021-11-06 DIAGNOSIS — N1832 Chronic kidney disease, stage 3b: Secondary | ICD-10-CM | POA: Diagnosis present

## 2021-11-06 LAB — BASIC METABOLIC PANEL
Anion gap: 10 (ref 5–15)
BUN: 37 mg/dL — ABNORMAL HIGH (ref 6–20)
CO2: 23 mmol/L (ref 22–32)
Calcium: 8.4 mg/dL — ABNORMAL LOW (ref 8.9–10.3)
Chloride: 102 mmol/L (ref 98–111)
Creatinine, Ser: 2.46 mg/dL — ABNORMAL HIGH (ref 0.44–1.00)
GFR, Estimated: 22 mL/min — ABNORMAL LOW (ref >60)
Glucose, Bld: 268 mg/dL — ABNORMAL HIGH (ref 70–99)
Potassium: 2.8 mmol/L — ABNORMAL LOW (ref 3.5–5.1)
Sodium: 135 mmol/L (ref 135–145)

## 2021-11-06 LAB — CBC
HCT: 25.7 % — ABNORMAL LOW (ref 36.0–46.0)
Hemoglobin: 8.9 g/dL — ABNORMAL LOW (ref 12.0–15.0)
MCH: 28.9 pg (ref 26.0–34.0)
MCHC: 34.6 g/dL (ref 30.0–36.0)
MCV: 83.4 fL (ref 80.0–100.0)
Platelets: 263 10*3/uL (ref 150–400)
RBC: 3.08 MIL/uL — ABNORMAL LOW (ref 3.87–5.11)
RDW: 13.7 % (ref 11.5–15.5)
WBC: 15.3 10*3/uL — ABNORMAL HIGH (ref 4.0–10.5)
nRBC: 0 % (ref 0.0–0.2)

## 2021-11-06 LAB — HEPATIC FUNCTION PANEL
ALT: 11 U/L (ref 0–44)
AST: 14 U/L — ABNORMAL LOW (ref 15–41)
Albumin: 2.1 g/dL — ABNORMAL LOW (ref 3.5–5.0)
Alkaline Phosphatase: 118 U/L (ref 38–126)
Bilirubin, Direct: <0.1 mg/dL (ref 0.0–0.2)
Total Bilirubin: 0.4 mg/dL (ref 0.3–1.2)
Total Protein: 6.2 g/dL — ABNORMAL LOW (ref 6.5–8.1)

## 2021-11-06 LAB — RESP PANEL BY RT-PCR (FLU A&B, COVID) ARPGX2
Influenza A by PCR: NEGATIVE
Influenza B by PCR: NEGATIVE
SARS Coronavirus 2 by RT PCR: NEGATIVE

## 2021-11-06 LAB — LIPASE, BLOOD: Lipase: 21 U/L (ref 11–51)

## 2021-11-06 LAB — MAGNESIUM: Magnesium: 1.9 mg/dL (ref 1.7–2.4)

## 2021-11-06 LAB — TROPONIN I (HIGH SENSITIVITY)
Troponin I (High Sensitivity): 26 ng/L — ABNORMAL HIGH (ref <18)
Troponin I (High Sensitivity): 27 ng/L — ABNORMAL HIGH (ref <18)

## 2021-11-06 IMAGING — CT CT CHEST W/O CM
2 of 4 series · 11 of 36 positions shown, 13 images · non-contrast
Comparison: CT chest, abdomen, pelvis [DATE], CT abdomen pelvis
[DATE]

CLINICAL DATA: Hemoptysis lung mass, hemoptysis, cp; Abdominal
pain, acute, nonlocalized



[Series 2: axial st · axial · 0.82mm/px · z∈[-366,+209]mm · 8 of 141 slices shown, 10 images]
[im 13/141  mediastinal]
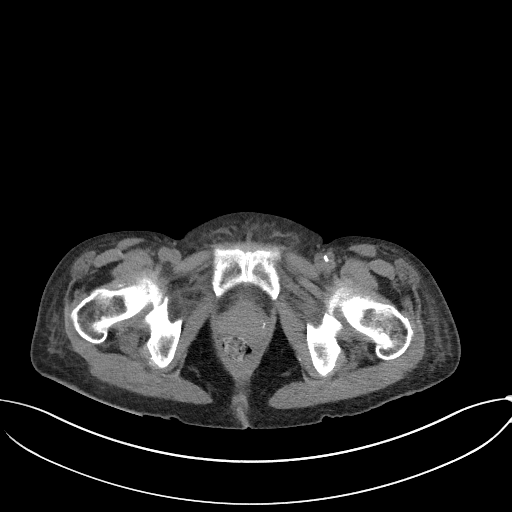
[im 13/141  lung]
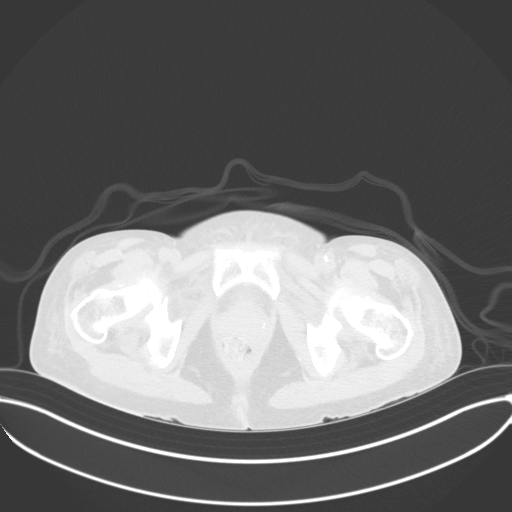
[im 26/141  lung]
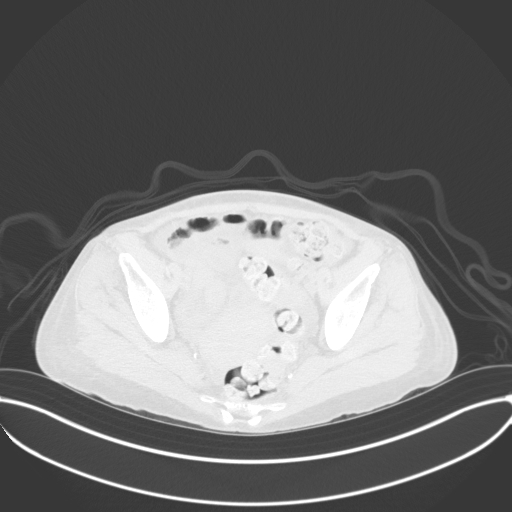
[im 51/141  lung]
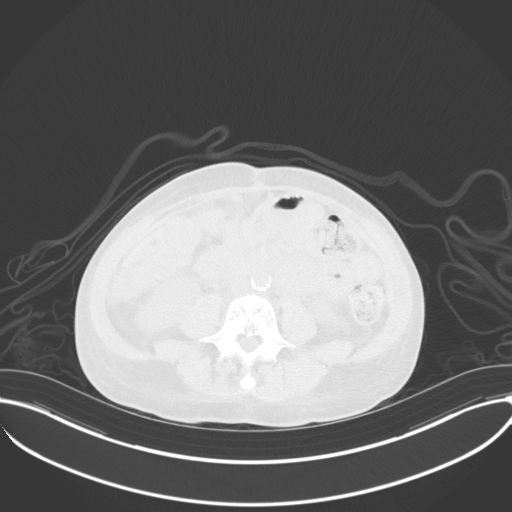
[im 64/141  lung]
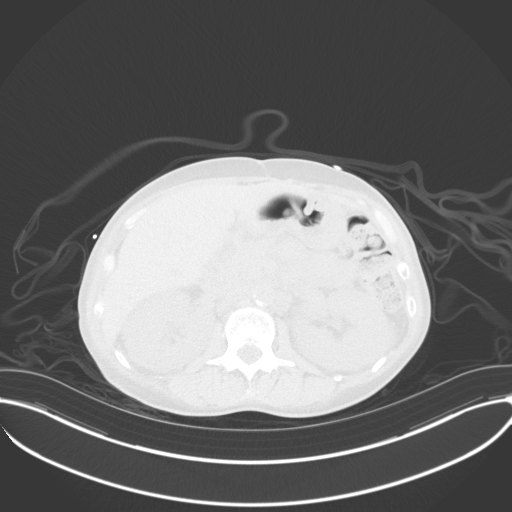
[im 77/141  mediastinal]
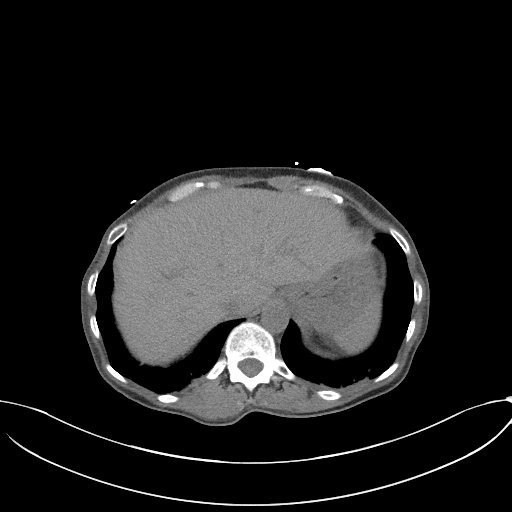
[im 77/141  lung]
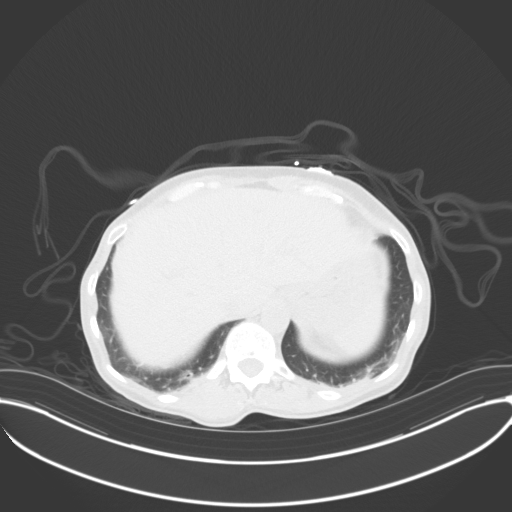
[im 90/141  lung]
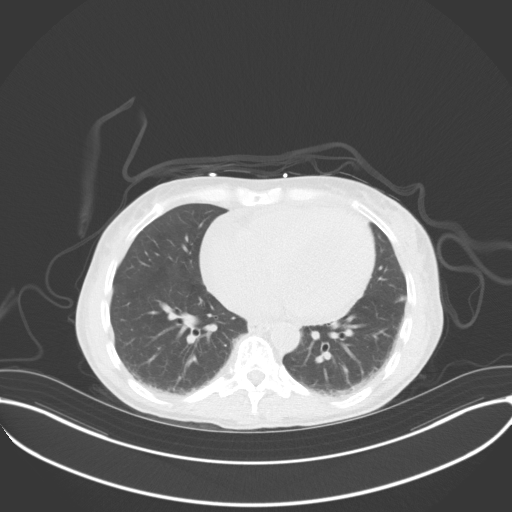
[im 115/141  lung]
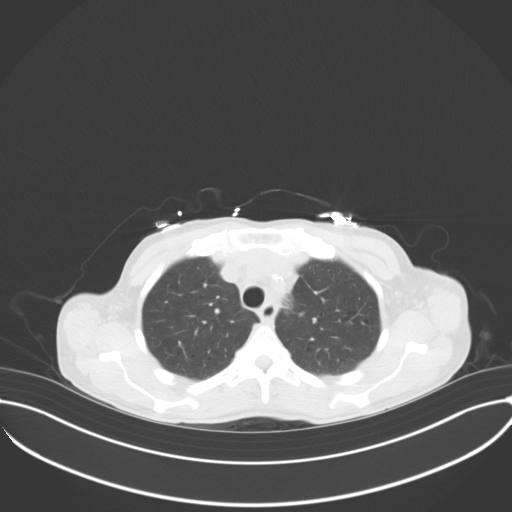
[im 128/141  lung]
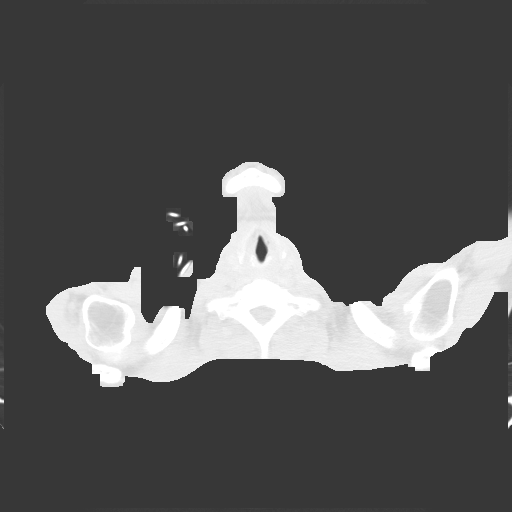

[Series 5: coronal · coronal · 0.70mm/px · 3 of 149 slices shown]
[im 30/149  lung]
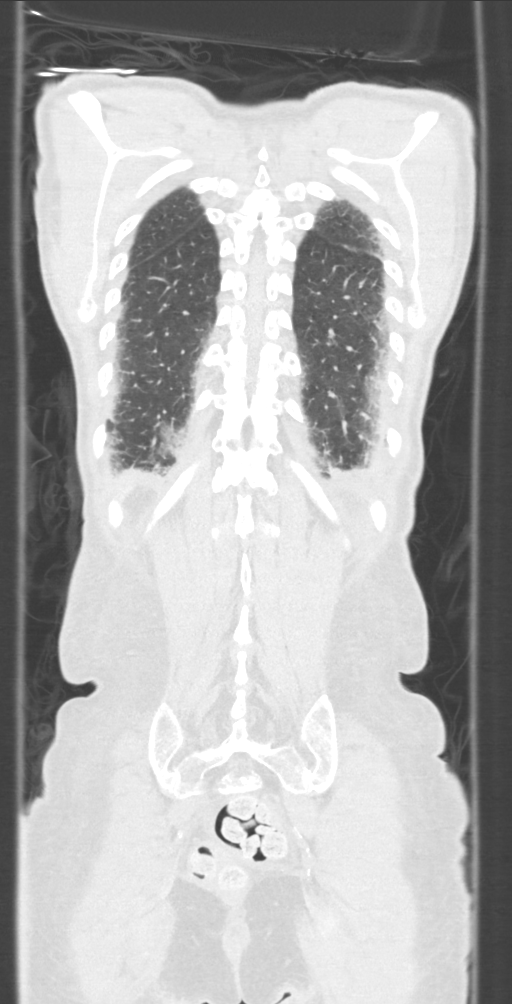
[im 60/149  lung]
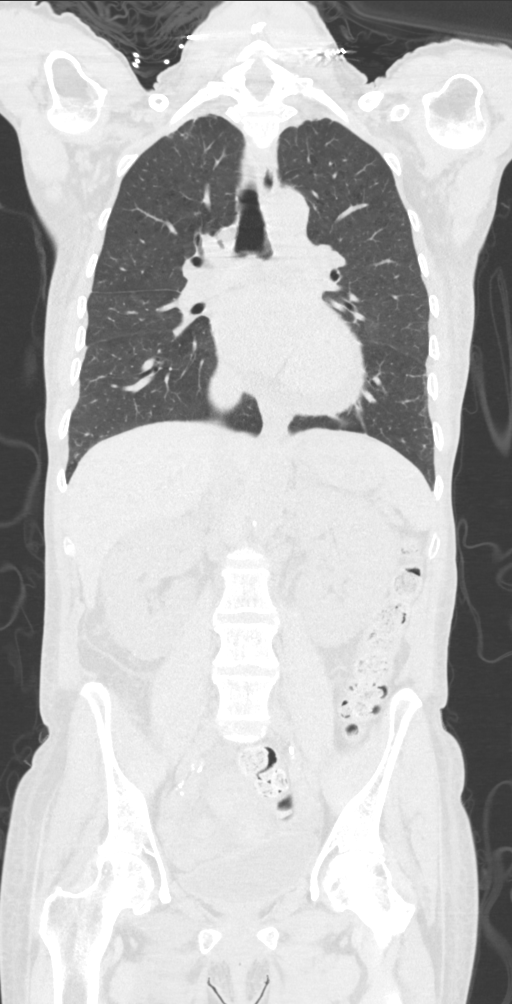
[im 89/149  lung]
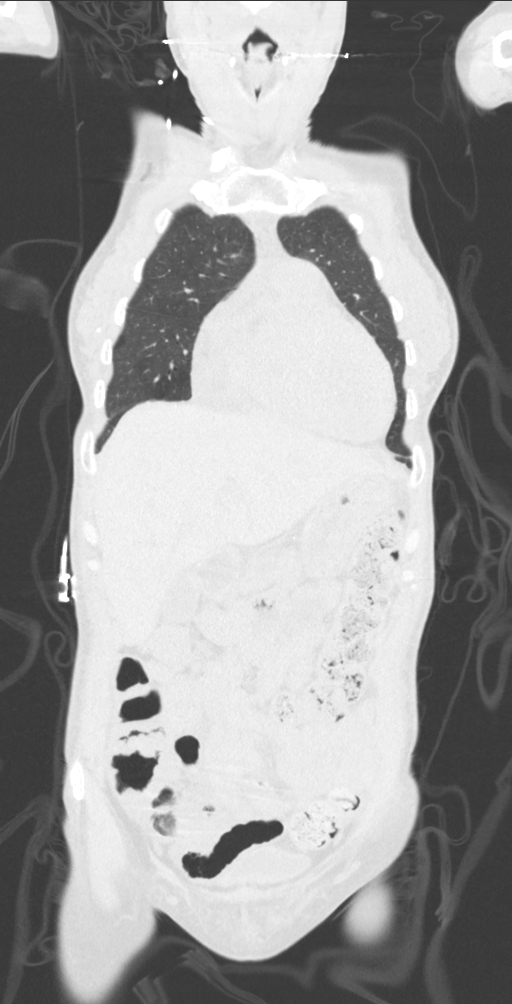

[11 of 36 positions shown; findings below may reference images not displayed]

FINDINGS: CT CHEST FINDINGS

Cardiovascular: Enlarged left ventricle. Persistent small
pericardial effusion. Cardiac findings suggestive of anemia. The
thoracic aorta is normal in caliber. No atherosclerotic plaque of
the thoracic aorta. No coronary artery calcifications.

Mediastinum/Nodes: No gross hilar adenopathy, noting limited
sensitivity for the detection of hilar adenopathy on this
noncontrast study. No enlarged mediastinal or axillary lymph nodes.
Thyroid gland, trachea, and esophagus demonstrate no significant
findings.

Lungs/Pleura: Bilateral lower lobe subsegmental atelectasis.
Biapical pleural/pulmonary scarring with nodular like density at the
right apex measuring 4 mm ([DATE], [DATE]). Trace paraseptal
emphysematous changes at the apices. Mild centrilobular
emphysematous changes. No focal consolidation. Stable 9 mm right
upper lobe pulmonary nodule ([DATE]). Redemonstration of a subpleural
micronodule within the right upper lobe ([DATE]). No new pulmonary
nodule. No pulmonary mass. No pleural effusion. No pneumothorax.

Musculoskeletal:

No chest wall abnormality.

No suspicious lytic or blastic osseous lesions. No acute displaced
fracture. Degenerative changes of visualized cervical spine.

CT ABDOMEN PELVIS FINDINGS

Hepatobiliary: No focal liver abnormality. No gallstones,
gallbladder wall thickening, or pericholecystic fluid. No biliary
dilatation.

Pancreas: No focal lesion. Poorly defined pancreatic contour likely
due to technique. No surrounding inflammatory changes. No main
pancreatic ductal dilatation.

Spleen: Normal in size without focal abnormality.

Adrenals/Urinary Tract:

No adrenal nodule bilaterally.

No nephrolithiasis and no hydronephrosis. No definite
contour-deforming renal mass.

No ureterolithiasis or hydroureter.

The urinary bladder is unremarkable.

Stomach/Bowel: Stomach is within normal limits. No evidence of bowel
wall thickening or dilatation. Appendix appears normal.

Vascular/Lymphatic: No abdominal aorta or iliac aneurysm. Severe
atherosclerotic plaque of the aorta and its branches. No abdominal,
pelvic, or inguinal lymphadenopathy.

Reproductive: Uterus and bilateral adnexa are unremarkable.

Other: No intraperitoneal free fluid. No intraperitoneal free gas.
No organized fluid collection.

Musculoskeletal:

Subcutaneus soft tissue edema.

Similar-appearing slightly sclerotic lesion within the right femoral
head. No suspicious lytic or blastic osseous lesions. No acute
displaced fracture.
IMPRESSION: 1. Stable 9 mm and a subpleural micronodule within the right upper
lobe. Biapical pleural/pulmonary scarring with nodular like density
at the right apex measuring 4 mm. Follow-up CT chest in 12-18 months
(from today's scan) is
considered optional for low-risk patients, but is recommended for
high-risk patients. This recommendation follows the consensus
statement: Guidelines for Management of Incidental Pulmonary Nodules
Detected on CT Images: From the [HOSPITAL] [U9]; Radiology
[U9]; [DATE].
2. Cardiomegaly.
3. Findings suggestive of anemia.
4. No acute intra-abdominal or intrapelvic abnormality in the
setting of markedly limited evaluation on this noncontrast study.
5.  Aortic Atherosclerosis ([U9]-[U9]).

## 2021-11-06 IMAGING — CR DG CHEST 2V
2 series · 2 of 2 positions shown · non-contrast
Comparison: Prior chest x-ray [DATE]

CLINICAL DATA: Chest pain

EXAM:
CHEST - 2 VIEW

[w chest pa]
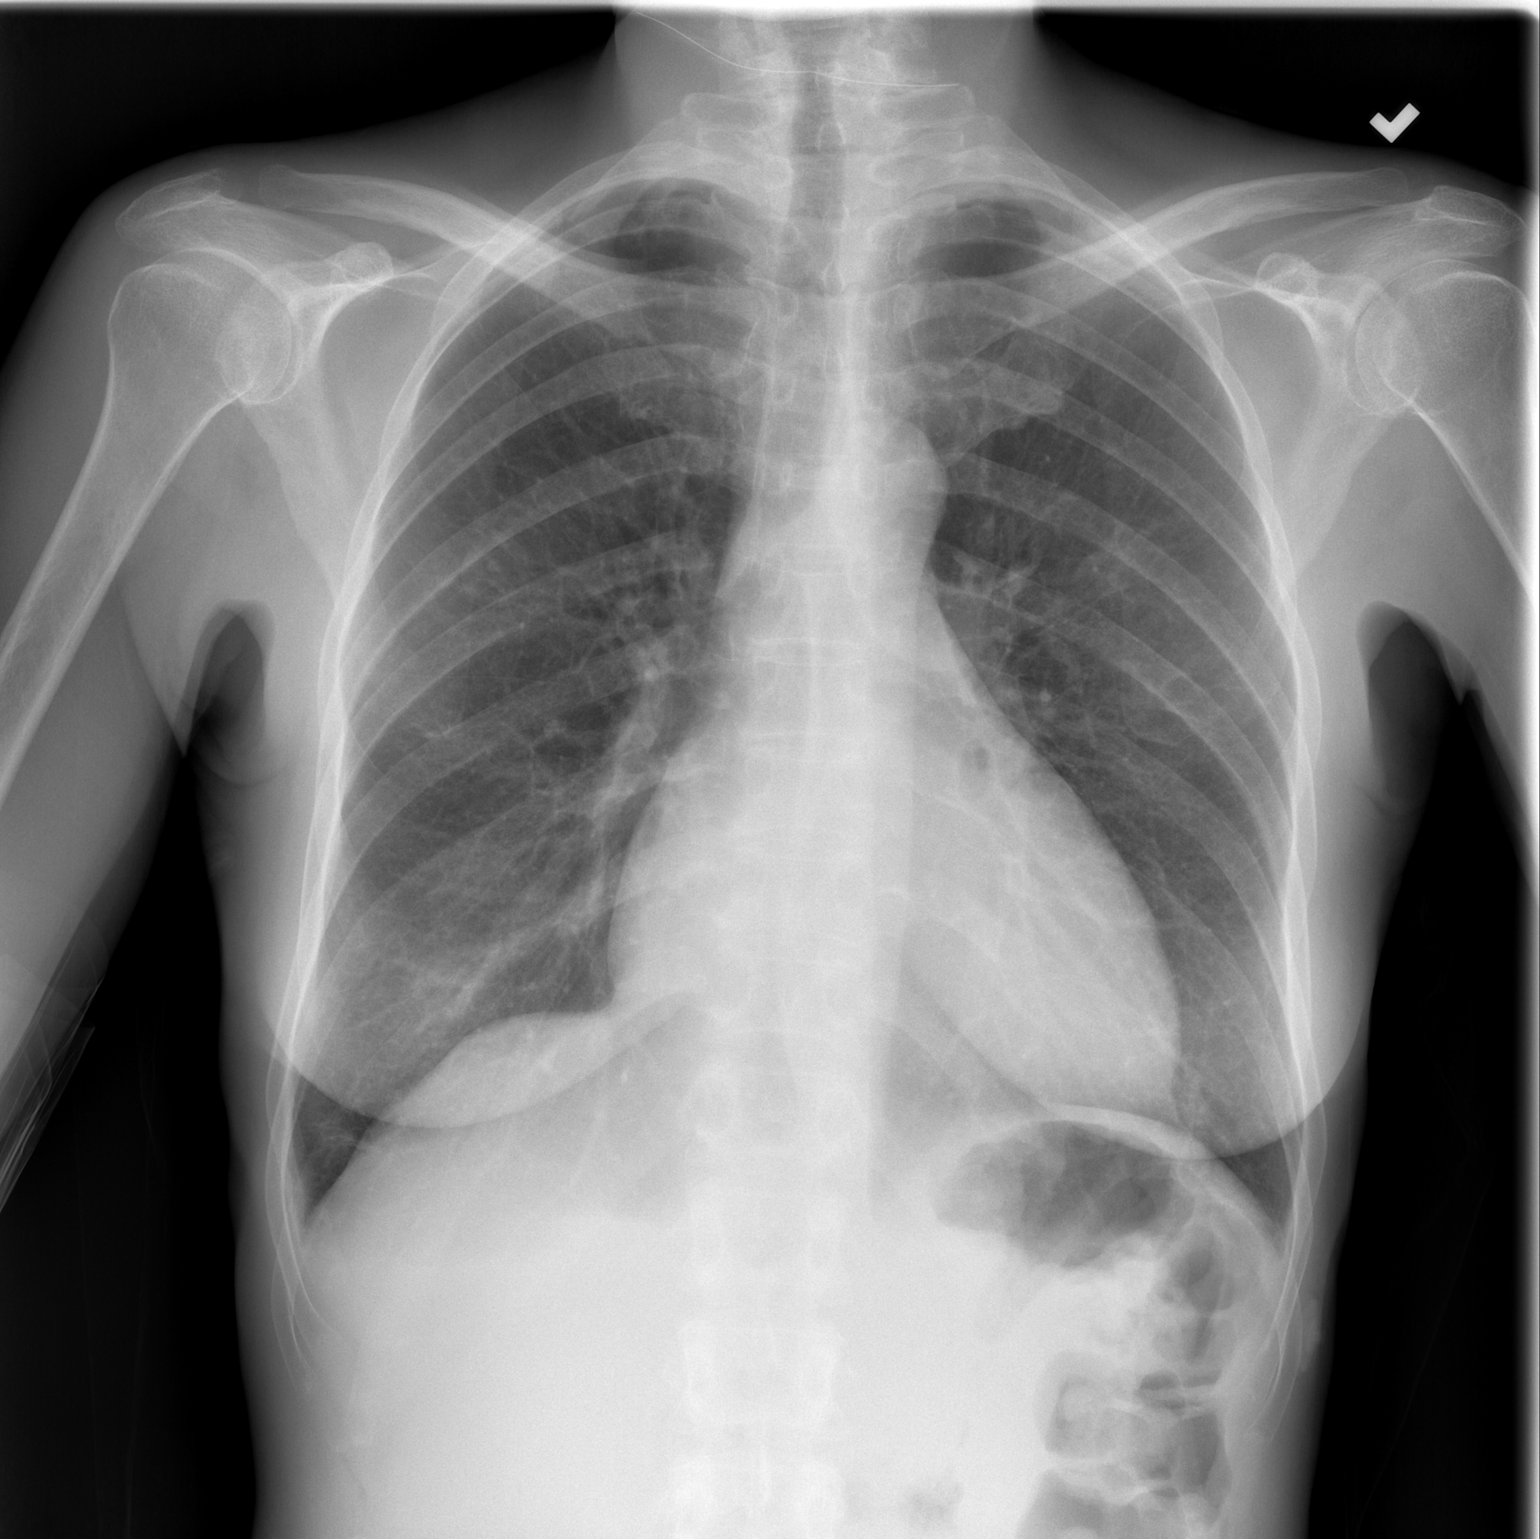

[w chest lat]
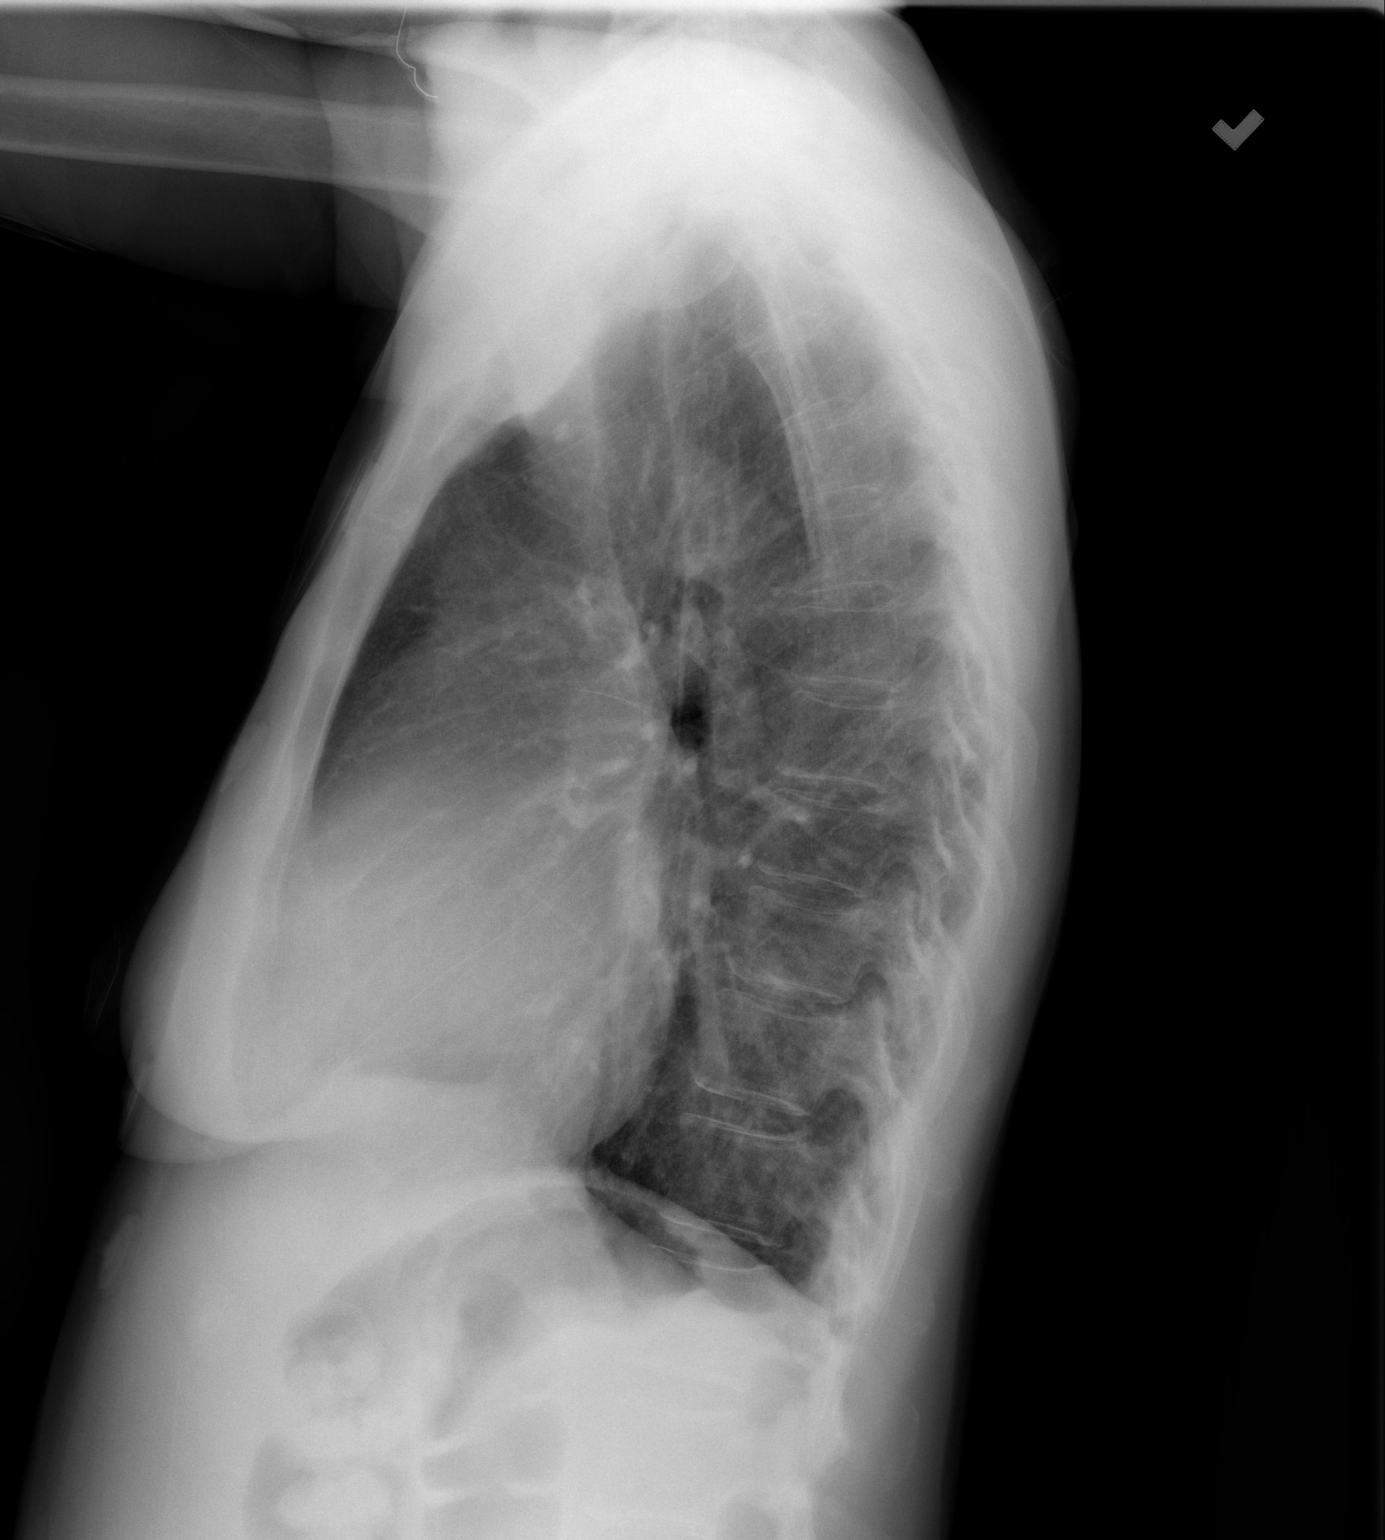

[2 of 2 positions shown; findings below may reference images not displayed]

FINDINGS: The heart size and mediastinal contours are within normal limits.
Both lungs are clear. The visualized skeletal structures are
unremarkable.
IMPRESSION: No active cardiopulmonary disease.

## 2021-11-06 MED ORDER — SODIUM CHLORIDE 0.9 % IV SOLN: INTRAVENOUS | Status: DC | PRN

## 2021-11-06 MED ORDER — MORPHINE SULFATE (PF) 4 MG/ML IV SOLN
4.0000 mg | Freq: Once | INTRAVENOUS | Status: AC
  Administered 2021-11-06: 4 mg via INTRAVENOUS
  Filled 2021-11-06: qty 1

## 2021-11-06 MED ORDER — ASPIRIN 81 MG PO CHEW
324.0000 mg | CHEWABLE_TABLET | Freq: Once | ORAL | Status: DC
  Filled 2021-11-06: qty 4

## 2021-11-06 MED ORDER — HYDRALAZINE HCL 20 MG/ML IJ SOLN
10.0000 mg | Freq: Once | INTRAMUSCULAR | Status: AC
  Administered 2021-11-06: 10 mg via INTRAVENOUS
  Filled 2021-11-06: qty 1

## 2021-11-06 MED ORDER — POTASSIUM CHLORIDE 10 MEQ/100ML IV SOLN
10.0000 meq | INTRAVENOUS | Status: AC
  Administered 2021-11-06 (×3): 10 meq via INTRAVENOUS
  Filled 2021-11-06 (×3): qty 100

## 2021-11-06 MED ORDER — MAGNESIUM SULFATE 2 GM/50ML IV SOLN
2.0000 g | Freq: Once | INTRAVENOUS | Status: AC
  Administered 2021-11-06: 2 g via INTRAVENOUS
  Filled 2021-11-06: qty 50

## 2021-11-06 MED ORDER — SODIUM CHLORIDE 0.9 % IV BOLUS
1000.0000 mL | Freq: Once | INTRAVENOUS | Status: AC
  Administered 2021-11-06: 1000 mL via INTRAVENOUS

## 2021-11-06 MED ORDER — ONDANSETRON HCL 4 MG/2ML IJ SOLN
4.0000 mg | Freq: Once | INTRAMUSCULAR | Status: AC
  Administered 2021-11-06: 4 mg via INTRAVENOUS
  Filled 2021-11-06: qty 2

## 2021-11-06 NOTE — ED Triage Notes (Signed)
Pt arrives with c/o chest pain that has been ongoing. Per pt, she never received a PET scan that was suppose to be scheduled. Per pt, she has been SOB and coughing up blood.  ?

## 2021-11-06 NOTE — ED Provider Notes (Signed)
?[facility] EMERGENCY DEPARTMENT ?Provider Note ? ? ?CSN: [id number] ?Arrival date & time: 11/06/21  1528 ? ?  ? ?History ? ?Chief Complaint  ?Patient presents with  ? Chest Pain  ? ? ?Amber Cross is a 61 y.o. female. ? ? Patient as above with significant medical history as below, including HFrEF @ 25% on entresto, hypertension, hyperlipidemia, tobacco abuse who presents to the ED with complaint of chest pain, hemoptysis, dyspnea. ? ?Patient was seen March 30 with similar complaint.  She was scheduled follow-up for PET scan for lung mass but has been able unable to do so.  Has been unable to see pulmonologist due to insurance issues.  Patient reports over the past few days her chest pain has gotten progressively worse, breathing now worse, she is now having hemoptysis. Pain and dib worsened with exertion. Some nausea and vomiting, poor PO last week, generalized abdominal discomfort.  Sensation of malaise.  No fevers or chills.  No recent alcohol use.  No trauma.  Reduced urine output was noted.  No dysuria. ? ?She has recently transition her care to the [facility] system as she was having difficulty coordinating care with [facility] 2/2 insurance issues ? ? ? ?Past Medical History: ?No date: Diabetes mellitus ?No date: Hypercholesteremia ?No date: Hypertension ?No date: Tobacco abuse ? ?Past Surgical History: ?No date: ANTERIOR CRUCIATE LIGAMENT REPAIR; Right ?No date: CATARACT EXTRACTION ?No date: OVARY SURGERY ?No date: RIGHT OOPHORECTOMY; Right ?No date: SMALL INTESTINE SURGERY ?No date: TUBAL LIGATION  ? ? ?The history is provided by the patient. No language interpreter was used.  ?Chest Pain ?Associated symptoms: abdominal pain, cough, fatigue, nausea, shortness of breath and vomiting   ?Associated symptoms: no dysphagia, no fever, no headache and no palpitations   ? ?  ? ?Home Medications ?Prior to Admission medications   ?Medication Sig Start Date End Date Taking? Authorizing Provider   ?acetaminophen (TYLENOL) 500 MG tablet Take 1,000 mg by mouth every 6 (six) hours as needed for mild pain.    [provider]  ?albuterol (VENTOLIN HFA) 108 (90 Base) MCG/ACT inhaler Inhale 2 puffs into the lungs every 6 (six) hours as needed for wheezing or shortness of breath. 03/26/21   [provider]  ?Bayer Microlet Lancets lancets Check blood sugar up to 3 times a day as instructed 04/06/19   Katherine Roan, MD  ?carvedilol (COREG) 12.5 MG tablet Take 1 tablet (12.5 mg total) by mouth 2 (two) times daily with a meal. 05/10/21 06/09/21  Antonieta Pert, MD  ?colchicine 0.6 MG tablet Take 1 tablet (0.6 mg total) by mouth daily. 05/10/21 05/10/22  Antonieta Pert, MD  ?DULoxetine (CYMBALTA) 60 MG capsule Take 1 capsule (60 mg total) by mouth daily. 07/17/20 05/08/21  Jean Rosenthal, MD  ?ezetimibe (ZETIA) 10 MG tablet Take 10 mg by mouth daily. 04/18/21   [provider]  ?furosemide (LASIX) 20 MG tablet Take 2 tablets (40 mg total) by mouth every other day. No refills 05/17/21 06/16/21  Tobb, Godfrey Pick, DO  ?gabapentin (NEURONTIN) 300 MG capsule Take 3 capsules (900 mg total) by mouth 3 (three) times daily. ?Patient taking differently: Take 300 mg by mouth 3 (three) times daily. 05/08/20 05/08/21  Mosetta Anis, MD  ?glucose blood test strip Check blood sugar up to 3 times a day as instructed 04/06/19   Katherine Roan, MD  ?insulin isophane & regular human KwikPen (NOVOLIN 70/30 KWIKPEN) (70-30) 100 UNIT/ML KwikPen Inject 6 Units into the skin  2 (two) times daily before lunch and supper. 05/10/21 06/09/21  Antonieta Pert, MD  ?ondansetron (ZOFRAN-ODT) 4 MG disintegrating tablet Take 1 tablet (4 mg total) by mouth every 8 (eight) hours as needed. 10/25/21   Long, Wonda Olds, MD  ?oxyCODONE-acetaminophen (PERCOCET/ROXICET) 5-325 MG tablet Take 1 tablet by mouth every 6 (six) hours as needed for severe pain. 10/25/21   Long, Wonda Olds, MD  ?sacubitril-valsartan (ENTRESTO) 24-26 MG Take 1 tablet by mouth 2  (two) times daily. 05/15/21   Tobb, Kardie, DO  ?senna-docusate (SENOKOT-S) 8.6-50 MG tablet Take 1 tablet by mouth at bedtime as needed for mild constipation. 10/25/21   Long, Wonda Olds, MD  ?spironolactone (ALDACTONE) 25 MG tablet Take 0.5 tablets (12.5 mg total) by mouth daily. 05/10/21 06/09/21  Antonieta Pert, MD  ?   ? ?Allergies    ?Penicillins, Strawberry (diagnostic), and Tomato   ? ?Review of Systems   ?Review of Systems  ?Constitutional:  Positive for fatigue. Negative for chills and fever.  ?HENT:  Negative for facial swelling and trouble swallowing.   ?Eyes:  Negative for photophobia and visual disturbance.  ?Respiratory:  Positive for cough and shortness of breath.   ?Cardiovascular:  Positive for chest pain. Negative for palpitations.  ?Gastrointestinal:  Positive for abdominal pain, nausea and vomiting.  ?Endocrine: Negative for polydipsia and polyuria.  ?Genitourinary:  Positive for decreased urine volume. Negative for difficulty urinating and hematuria.  ?Musculoskeletal:  Positive for arthralgias. Negative for gait problem and joint swelling.  ?Skin:  Negative for pallor and rash.  ?Neurological:  Negative for syncope and headaches.  ?Psychiatric/Behavioral:  Negative for agitation and confusion.   ? ?Physical Exam ?Updated Vital Signs ?BP (!) 170/81   Pulse 83   Temp 98.7 ?F (37.1 ?C) (Oral)   Resp 15   Ht 5\' 7"  (1.702 m)   Wt 54.4 kg   LMP 09/29/2011   SpO2 97%   BMI 18.79 kg/m?  ?Physical Exam ?Vitals and nursing note reviewed.  ?Constitutional:   ?   General: She is not in acute distress. ?   Appearance: Normal appearance. She is not ill-appearing.  ?HENT:  ?   Head: Normocephalic and atraumatic.  ?   Jaw: There is normal jaw occlusion. No trismus.  ?   Right Ear: External ear normal.  ?   Left Ear: External ear normal.  ?   Nose: Nose normal.  ?   Mouth/Throat:  ?   Mouth: Mucous membranes are moist.  ?   Comments: Poor dentition ?Eyes:  ?   General: No scleral icterus.    ?   Right eye: No  discharge.     ?   Left eye: No discharge.  ?Cardiovascular:  ?   Rate and Rhythm: Normal rate and regular rhythm.  ?   Pulses: Normal pulses.  ?   Heart sounds: Normal heart sounds.  ?Pulmonary:  ?   Effort: Pulmonary effort is normal. No respiratory distress.  ?   Breath sounds: Normal breath sounds.  ?Abdominal:  ?   General: Abdomen is flat.  ?   Palpations: Abdomen is soft.  ?   Tenderness: There is generalized abdominal tenderness. There is no guarding or rebound.  ?Musculoskeletal:     ?   General: Normal range of motion.  ?   Cervical back: Full passive range of motion without pain and normal range of motion.  ?   Right lower leg: No edema.  ?   Left lower leg: No  edema.  ?Skin: ?   General: Skin is warm and dry.  ?   Capillary Refill: Capillary refill takes less than 2 seconds.  ?Neurological:  ?   Mental Status: She is alert.  ?Psychiatric:     ?   Mood and Affect: Mood normal.     ?   Behavior: Behavior normal.  ? ? ?ED Results / Procedures / Treatments   ?Labs ?(all labs ordered are listed, but only abnormal results are displayed) ?Labs Reviewed  ?BASIC METABOLIC PANEL - Abnormal; Notable for the following components:  ?    Result Value  ? Potassium 2.8 (*)   ? Glucose, Bld 268 (*)   ? BUN 37 (*)   ? Creatinine, Ser 2.46 (*)   ? Calcium 8.4 (*)   ? GFR, Estimated 22 (*)   ? All other components within normal limits  ?CBC - Abnormal; Notable for the following components:  ? WBC 15.3 (*)   ? RBC 3.08 (*)   ? Hemoglobin 8.9 (*)   ? HCT 25.7 (*)   ? All other components within normal limits  ?HEPATIC FUNCTION PANEL - Abnormal; Notable for the following components:  ? Total Protein 6.2 (*)   ? Albumin 2.1 (*)   ? AST 14 (*)   ? All other components within normal limits  ?TROPONIN I (HIGH SENSITIVITY) - Abnormal; Notable for the following components:  ? Troponin I (High Sensitivity) 26 (*)   ? All other components within normal limits  ?TROPONIN I (HIGH SENSITIVITY) - Abnormal; Notable for the following  components:  ? Troponin I (High Sensitivity) 27 (*)   ? All other components within normal limits  ?RESP PANEL BY RT-PCR (FLU A&B, COVID) ARPGX2  ?LIPASE, BLOOD  ?MAGNESIUM  ?URINALYSIS, ROUTINE W REFLEX MICROSCOPIC  ?IRON AND T

## 2021-11-07 ENCOUNTER — Telehealth: Payer: Self-pay | Admitting: Pulmonary Disease

## 2021-11-07 DIAGNOSIS — Z794 Long term (current) use of insulin: Secondary | ICD-10-CM | POA: Diagnosis not present

## 2021-11-07 DIAGNOSIS — R918 Other nonspecific abnormal finding of lung field: Secondary | ICD-10-CM | POA: Diagnosis present

## 2021-11-07 DIAGNOSIS — N179 Acute kidney failure, unspecified: Secondary | ICD-10-CM | POA: Diagnosis present

## 2021-11-07 DIAGNOSIS — R079 Chest pain, unspecified: Secondary | ICD-10-CM

## 2021-11-07 DIAGNOSIS — R809 Proteinuria, unspecified: Secondary | ICD-10-CM

## 2021-11-07 DIAGNOSIS — E1165 Type 2 diabetes mellitus with hyperglycemia: Secondary | ICD-10-CM | POA: Diagnosis present

## 2021-11-07 DIAGNOSIS — E1142 Type 2 diabetes mellitus with diabetic polyneuropathy: Secondary | ICD-10-CM | POA: Diagnosis present

## 2021-11-07 DIAGNOSIS — F419 Anxiety disorder, unspecified: Secondary | ICD-10-CM | POA: Diagnosis present

## 2021-11-07 DIAGNOSIS — Z833 Family history of diabetes mellitus: Secondary | ICD-10-CM | POA: Diagnosis not present

## 2021-11-07 DIAGNOSIS — E7800 Pure hypercholesterolemia, unspecified: Secondary | ICD-10-CM | POA: Diagnosis present

## 2021-11-07 DIAGNOSIS — A599 Trichomoniasis, unspecified: Secondary | ICD-10-CM | POA: Diagnosis present

## 2021-11-07 DIAGNOSIS — D649 Anemia, unspecified: Secondary | ICD-10-CM

## 2021-11-07 DIAGNOSIS — I5043 Acute on chronic combined systolic (congestive) and diastolic (congestive) heart failure: Secondary | ICD-10-CM

## 2021-11-07 DIAGNOSIS — I5023 Acute on chronic systolic (congestive) heart failure: Secondary | ICD-10-CM

## 2021-11-07 DIAGNOSIS — Z20822 Contact with and (suspected) exposure to covid-19: Secondary | ICD-10-CM | POA: Diagnosis present

## 2021-11-07 DIAGNOSIS — R042 Hemoptysis: Secondary | ICD-10-CM | POA: Diagnosis present

## 2021-11-07 DIAGNOSIS — Z880 Allergy status to penicillin: Secondary | ICD-10-CM | POA: Diagnosis not present

## 2021-11-07 DIAGNOSIS — I5022 Chronic systolic (congestive) heart failure: Secondary | ICD-10-CM | POA: Diagnosis present

## 2021-11-07 DIAGNOSIS — E1122 Type 2 diabetes mellitus with diabetic chronic kidney disease: Secondary | ICD-10-CM | POA: Diagnosis present

## 2021-11-07 DIAGNOSIS — I130 Hypertensive heart and chronic kidney disease with heart failure and stage 1 through stage 4 chronic kidney disease, or unspecified chronic kidney disease: Secondary | ICD-10-CM | POA: Diagnosis present

## 2021-11-07 DIAGNOSIS — G8929 Other chronic pain: Secondary | ICD-10-CM | POA: Diagnosis present

## 2021-11-07 DIAGNOSIS — R7989 Other specified abnormal findings of blood chemistry: Secondary | ICD-10-CM | POA: Diagnosis not present

## 2021-11-07 DIAGNOSIS — N1832 Chronic kidney disease, stage 3b: Secondary | ICD-10-CM | POA: Diagnosis present

## 2021-11-07 DIAGNOSIS — F17210 Nicotine dependence, cigarettes, uncomplicated: Secondary | ICD-10-CM | POA: Diagnosis present

## 2021-11-07 DIAGNOSIS — E860 Dehydration: Secondary | ICD-10-CM | POA: Diagnosis present

## 2021-11-07 DIAGNOSIS — Z8249 Family history of ischemic heart disease and other diseases of the circulatory system: Secondary | ICD-10-CM | POA: Diagnosis not present

## 2021-11-07 DIAGNOSIS — D631 Anemia in chronic kidney disease: Secondary | ICD-10-CM | POA: Diagnosis present

## 2021-11-07 DIAGNOSIS — Z90721 Acquired absence of ovaries, unilateral: Secondary | ICD-10-CM | POA: Diagnosis not present

## 2021-11-07 DIAGNOSIS — Z91018 Allergy to other foods: Secondary | ICD-10-CM | POA: Diagnosis not present

## 2021-11-07 HISTORY — DX: Proteinuria, unspecified: R80.9

## 2021-11-07 LAB — TROPONIN I (HIGH SENSITIVITY)
Troponin I (High Sensitivity): 19 ng/L — ABNORMAL HIGH (ref <18)
Troponin I (High Sensitivity): 19 ng/L — ABNORMAL HIGH (ref <18)

## 2021-11-07 LAB — CBC
HCT: 22.5 % — ABNORMAL LOW (ref 36.0–46.0)
Hemoglobin: 7.8 g/dL — ABNORMAL LOW (ref 12.0–15.0)
MCH: 29.1 pg (ref 26.0–34.0)
MCHC: 34.7 g/dL (ref 30.0–36.0)
MCV: 84 fL (ref 80.0–100.0)
Platelets: 245 10*3/uL (ref 150–400)
RBC: 2.68 MIL/uL — ABNORMAL LOW (ref 3.87–5.11)
RDW: 13.8 % (ref 11.5–15.5)
WBC: 12.6 10*3/uL — ABNORMAL HIGH (ref 4.0–10.5)
nRBC: 0 % (ref 0.0–0.2)

## 2021-11-07 LAB — URINALYSIS, ROUTINE W REFLEX MICROSCOPIC
Bilirubin Urine: NEGATIVE
Glucose, UA: >=500 mg/dL — AB
Ketones, ur: NEGATIVE mg/dL
Leukocytes,Ua: NEGATIVE
Nitrite: NEGATIVE
Protein, ur: >=300 mg/dL — AB
Specific Gravity, Urine: 1.025 (ref 1.005–1.030)
pH: 5.5 (ref 5.0–8.0)

## 2021-11-07 LAB — BASIC METABOLIC PANEL
Anion gap: 7 (ref 5–15)
BUN: 36 mg/dL — ABNORMAL HIGH (ref 6–20)
CO2: 21 mmol/L — ABNORMAL LOW (ref 22–32)
Calcium: 7.6 mg/dL — ABNORMAL LOW (ref 8.9–10.3)
Chloride: 103 mmol/L (ref 98–111)
Creatinine, Ser: 2.27 mg/dL — ABNORMAL HIGH (ref 0.44–1.00)
GFR, Estimated: 24 mL/min — ABNORMAL LOW (ref >60)
Glucose, Bld: 364 mg/dL — ABNORMAL HIGH (ref 70–99)
Potassium: 3.5 mmol/L (ref 3.5–5.1)
Sodium: 131 mmol/L — ABNORMAL LOW (ref 135–145)

## 2021-11-07 LAB — FERRITIN: Ferritin: 395 ng/mL — ABNORMAL HIGH (ref 11–307)

## 2021-11-07 LAB — HEMOGLOBIN A1C
Hgb A1c MFr Bld: 9.6 % — ABNORMAL HIGH (ref 4.8–5.6)
Mean Plasma Glucose: 228.82 mg/dL

## 2021-11-07 LAB — URINALYSIS, MICROSCOPIC (REFLEX)

## 2021-11-07 LAB — IRON AND TIBC
Iron: 15 ug/dL — ABNORMAL LOW (ref 28–170)
Saturation Ratios: 8 % — ABNORMAL LOW (ref 10.4–31.8)
TIBC: 182 ug/dL — ABNORMAL LOW (ref 250–450)
UIBC: 167 ug/dL

## 2021-11-07 LAB — PROTEIN / CREATININE RATIO, URINE
Creatinine, Urine: 48.24 mg/dL
Protein Creatinine Ratio: 16.42 mg/mg{Cre} — ABNORMAL HIGH (ref 0.00–0.15)
Total Protein, Urine: 792 mg/dL

## 2021-11-07 LAB — GLUCOSE, CAPILLARY
Glucose-Capillary: 371 mg/dL — ABNORMAL HIGH (ref 70–99)
Glucose-Capillary: 428 mg/dL — ABNORMAL HIGH (ref 70–99)
Glucose-Capillary: 178 mg/dL — ABNORMAL HIGH (ref 70–99)

## 2021-11-07 LAB — D-DIMER, QUANTITATIVE: D-Dimer, Quant: 6.22 ug/mL-FEU — ABNORMAL HIGH (ref 0.00–0.50)

## 2021-11-07 MED ORDER — ACETAMINOPHEN 500 MG PO TABS
1000.0000 mg | ORAL_TABLET | Freq: Four times a day (QID) | ORAL | Status: DC | PRN
  Filled 2021-11-07: qty 2

## 2021-11-07 MED ORDER — METRONIDAZOLE 500 MG PO TABS
500.0000 mg | ORAL_TABLET | Freq: Two times a day (BID) | ORAL | Status: DC
  Administered 2021-11-07 – 2021-11-09 (×5): 500 mg via ORAL
  Filled 2021-11-07 (×5): qty 1

## 2021-11-07 MED ORDER — ALBUTEROL SULFATE HFA 108 (90 BASE) MCG/ACT IN AERS: 2.0000 | INHALATION_SPRAY | Freq: Four times a day (QID) | RESPIRATORY_TRACT | Status: DC | PRN

## 2021-11-07 MED ORDER — INSULIN NPH (HUMAN) (ISOPHANE) 100 UNIT/ML ~~LOC~~ SUSP
6.0000 [IU] | Freq: Two times a day (BID) | SUBCUTANEOUS | Status: DC
  Administered 2021-11-07: 6 [IU] via SUBCUTANEOUS
  Filled 2021-11-07: qty 10

## 2021-11-07 MED ORDER — ONDANSETRON HCL 4 MG/2ML IJ SOLN
4.0000 mg | Freq: Four times a day (QID) | INTRAMUSCULAR | Status: DC | PRN
  Administered 2021-11-07 – 2021-11-09 (×3): 4 mg via INTRAVENOUS
  Filled 2021-11-07 (×3): qty 2

## 2021-11-07 MED ORDER — OXYCODONE-ACETAMINOPHEN 7.5-325 MG PO TABS
1.0000 | ORAL_TABLET | Freq: Four times a day (QID) | ORAL | Status: DC | PRN
  Administered 2021-11-07 – 2021-11-09 (×4): 1 via ORAL
  Filled 2021-11-07 (×4): qty 1

## 2021-11-07 MED ORDER — GABAPENTIN 300 MG PO CAPS
300.0000 mg | ORAL_CAPSULE | Freq: Three times a day (TID) | ORAL | Status: DC
  Administered 2021-11-07: 300 mg via ORAL
  Filled 2021-11-07: qty 1

## 2021-11-07 MED ORDER — HEPARIN SODIUM (PORCINE) 5000 UNIT/ML IJ SOLN
5000.0000 [IU] | Freq: Three times a day (TID) | INTRAMUSCULAR | Status: DC
  Administered 2021-11-07 – 2021-11-09 (×6): 5000 [IU] via SUBCUTANEOUS
  Filled 2021-11-07 (×6): qty 1

## 2021-11-07 MED ORDER — SENNOSIDES-DOCUSATE SODIUM 8.6-50 MG PO TABS
1.0000 | ORAL_TABLET | Freq: Every evening | ORAL | Status: DC | PRN
  Filled 2021-11-07: qty 1

## 2021-11-07 MED ORDER — INSULIN ASPART 100 UNIT/ML IJ SOLN: 0.0000 [IU] | Freq: Every day | INTRAMUSCULAR | Status: DC

## 2021-11-07 MED ORDER — SPIRONOLACTONE 12.5 MG HALF TABLET
12.5000 mg | ORAL_TABLET | Freq: Every day | ORAL | Status: DC
Start: 2021-11-07 — End: 2021-11-07
  Filled 2021-11-07: qty 1

## 2021-11-07 MED ORDER — HYDRALAZINE HCL 20 MG/ML IJ SOLN
10.0000 mg | Freq: Three times a day (TID) | INTRAMUSCULAR | Status: DC | PRN
  Administered 2021-11-07: 10 mg via INTRAVENOUS
  Filled 2021-11-07: qty 1

## 2021-11-07 MED ORDER — INSULIN NPH (HUMAN) (ISOPHANE) 100 UNIT/ML ~~LOC~~ SUSP
4.0000 [IU] | Freq: Two times a day (BID) | SUBCUTANEOUS | Status: DC
  Administered 2021-11-07: 4 [IU] via SUBCUTANEOUS
  Filled 2021-11-07: qty 10

## 2021-11-07 MED ORDER — DULOXETINE HCL 60 MG PO CPEP
60.0000 mg | ORAL_CAPSULE | Freq: Every day | ORAL | Status: DC
  Administered 2021-11-07 – 2021-11-09 (×3): 60 mg via ORAL
  Filled 2021-11-07 (×4): qty 1

## 2021-11-07 MED ORDER — INSULIN ASPART 100 UNIT/ML IJ SOLN
0.0000 [IU] | Freq: Three times a day (TID) | INTRAMUSCULAR | Status: DC
  Administered 2021-11-07: 9 [IU] via SUBCUTANEOUS

## 2021-11-07 MED ORDER — ALBUTEROL SULFATE (2.5 MG/3ML) 0.083% IN NEBU: 2.5000 mg | INHALATION_SOLUTION | Freq: Four times a day (QID) | RESPIRATORY_TRACT | Status: DC | PRN

## 2021-11-07 MED ORDER — MORPHINE SULFATE (PF) 2 MG/ML IV SOLN
2.0000 mg | INTRAVENOUS | Status: DC | PRN
  Administered 2021-11-07: 2 mg via INTRAVENOUS
  Filled 2021-11-07: qty 1

## 2021-11-07 MED ORDER — ASPIRIN EC 81 MG PO TBEC
81.0000 mg | DELAYED_RELEASE_TABLET | Freq: Every day | ORAL | Status: DC
  Filled 2021-11-07: qty 1

## 2021-11-07 MED ORDER — INSULIN ASPART 100 UNIT/ML IJ SOLN
0.0000 [IU] | Freq: Three times a day (TID) | INTRAMUSCULAR | Status: DC
  Administered 2021-11-08: 2 [IU] via SUBCUTANEOUS
  Administered 2021-11-08: 3 [IU] via SUBCUTANEOUS

## 2021-11-07 MED ORDER — CARVEDILOL 12.5 MG PO TABS
12.5000 mg | ORAL_TABLET | Freq: Two times a day (BID) | ORAL | Status: DC
  Administered 2021-11-07 – 2021-11-09 (×5): 12.5 mg via ORAL
  Filled 2021-11-07 (×5): qty 1

## 2021-11-07 MED ORDER — EZETIMIBE 10 MG PO TABS
10.0000 mg | ORAL_TABLET | Freq: Every day | ORAL | Status: DC
  Administered 2021-11-07 – 2021-11-09 (×3): 10 mg via ORAL
  Filled 2021-11-07 (×4): qty 1

## 2021-11-07 MED ORDER — COLCHICINE 0.6 MG PO TABS
0.6000 mg | ORAL_TABLET | Freq: Every day | ORAL | Status: DC
Start: 2021-11-07 — End: 2021-11-07
  Administered 2021-11-07: 0.6 mg via ORAL
  Filled 2021-11-07: qty 1

## 2021-11-07 MED ORDER — ACETAMINOPHEN 325 MG PO TABS
650.0000 mg | ORAL_TABLET | ORAL | Status: DC | PRN
  Administered 2021-11-08: 650 mg via ORAL
  Filled 2021-11-07 (×2): qty 2

## 2021-11-07 MED ORDER — SODIUM CHLORIDE 0.9 % IV SOLN: Freq: Once | INTRAVENOUS | Status: AC

## 2021-11-07 NOTE — ED Notes (Signed)
Patient was asleep. Vitals assessed. Patient offered refreshments and given something to drink.  ?

## 2021-11-07 NOTE — H&P (Signed)
?History and Physical  ? ? ?Patient: Amber Cross HCW:[id number] DOB: [birth year] ?DOA: 11/06/2021 ?DOS: the patient was seen and examined on 11/07/2021 ?PCP: Pcp, No  ?Patient coming from: Home ? ?Chief Complaint:  ?Chief Complaint  ?Patient presents with  ? Chest Pain  ? ?HPI: Amber Cross is a 61 y.o. female with medical history significant of DM2, HTN, HLD, tobacco abuse, anxiety, chronic systolic HF. Presenting with chest pain. She reports that she's had chest pain on and off for the last several months. She has had follow up with cardiology on this matter. She reports 2 nights ago her pain and breathing worsened, so she decided to go to the ED yesterday morning. She reports that she has had a couple of episodes of hemoptysis. She otherwise denies any aggravating or alleviating factors.  ?  ?Review of Systems: As mentioned in the history of present illness. All other systems reviewed and are negative. ?Past Medical History:  ?Diagnosis Date  ? Diabetes mellitus   ? Hypercholesteremia   ? Hypertension   ? Tobacco abuse   ? ?Past Surgical History:  ?Procedure Laterality Date  ? ANTERIOR CRUCIATE LIGAMENT REPAIR Right   ? CATARACT EXTRACTION    ? OVARY SURGERY    ? RIGHT OOPHORECTOMY Right   ? SMALL INTESTINE SURGERY    ? TUBAL LIGATION    ? ?Social History:  reports that she has been smoking cigarettes. She has a 20.00 pack-year smoking history. She has never used smokeless tobacco. She reports that she does not currently use alcohol after a past usage of about 1.0 standard drink per week. She reports that she does not use drugs. ? ?Allergies  ?Allergen Reactions  ? Penicillins   ?  itch  ? Strawberry (Diagnostic) Itching  ? Tomato Itching  ? ? ?Family History  ?Problem Relation Age of Onset  ? Diabetes Mother   ? Hypertension Mother   ? Hypertension Father   ? ? ?Prior to Admission medications   ?Medication Sig Start Date End Date Taking? Authorizing Provider  ?acetaminophen (TYLENOL) 500 MG tablet Take 1,000  mg by mouth every 6 (six) hours as needed for mild pain.    [provider]  ?albuterol (VENTOLIN HFA) 108 (90 Base) MCG/ACT inhaler Inhale 2 puffs into the lungs every 6 (six) hours as needed for wheezing or shortness of breath. 03/26/21   [provider]  ?Bayer Microlet Lancets lancets Check blood sugar up to 3 times a day as instructed 04/06/19   Katherine Roan, MD  ?carvedilol (COREG) 12.5 MG tablet Take 1 tablet (12.5 mg total) by mouth 2 (two) times daily with a meal. 05/10/21 06/09/21  Antonieta Pert, MD  ?colchicine 0.6 MG tablet Take 1 tablet (0.6 mg total) by mouth daily. 05/10/21 05/10/22  Antonieta Pert, MD  ?DULoxetine (CYMBALTA) 60 MG capsule Take 1 capsule (60 mg total) by mouth daily. 07/17/20 05/08/21  Jean Rosenthal, MD  ?ezetimibe (ZETIA) 10 MG tablet Take 10 mg by mouth daily. 04/18/21   [provider]  ?furosemide (LASIX) 20 MG tablet Take 2 tablets (40 mg total) by mouth every other day. No refills 05/17/21 06/16/21  Tobb, Godfrey Pick, DO  ?gabapentin (NEURONTIN) 300 MG capsule Take 3 capsules (900 mg total) by mouth 3 (three) times daily. ?Patient taking differently: Take 300 mg by mouth 3 (three) times daily. 05/08/20 05/08/21  Mosetta Anis, MD  ?glucose blood test strip Check blood sugar up to 3 times a day as instructed 04/06/19  Katherine Roan, MD  ?insulin isophane & regular human KwikPen (NOVOLIN 70/30 KWIKPEN) (70-30) 100 UNIT/ML KwikPen Inject 6 Units into the skin 2 (two) times daily before lunch and supper. 05/10/21 06/09/21  Antonieta Pert, MD  ?ondansetron (ZOFRAN-ODT) 4 MG disintegrating tablet Take 1 tablet (4 mg total) by mouth every 8 (eight) hours as needed. 10/25/21   Long, Wonda Olds, MD  ?oxyCODONE-acetaminophen (PERCOCET/ROXICET) 5-325 MG tablet Take 1 tablet by mouth every 6 (six) hours as needed for severe pain. 10/25/21   Long, Wonda Olds, MD  ?sacubitril-valsartan (ENTRESTO) 24-26 MG Take 1 tablet by mouth 2 (two) times daily. 05/15/21   Tobb, Kardie, DO   ?senna-docusate (SENOKOT-S) 8.6-50 MG tablet Take 1 tablet by mouth at bedtime as needed for mild constipation. 10/25/21   Long, Wonda Olds, MD  ?spironolactone (ALDACTONE) 25 MG tablet Take 0.5 tablets (12.5 mg total) by mouth daily. 05/10/21 06/09/21  Antonieta Pert, MD  ? ? ?Physical Exam: ?Vitals:  ? 11/07/21 0700 11/07/21 0845 11/07/21 1238 11/07/21 1241  ?BP:  (!) 164/81  (!) 208/89  ?Pulse:  77  84  ?Resp:  14  16  ?Temp: 98.3 ?F (36.8 ?C)   98.2 ?F (36.8 ?C)  ?TempSrc: Oral   Oral  ?SpO2:  98%  100%  ?Weight:   54.4 kg   ?Height:   5\' 7"  (1.702 m)   ? ?General: 61 y.o. female resting in bed in NAD ?Eyes: PERRL, normal sclera ?ENMT: Nares patent w/o discharge, orophaynx clear, dentition normal, ears w/o discharge/lesions/ulcers ?Neck: Supple, trachea midline ?Cardiovascular: RRR, +S1, S2, no m/g/r, equal pulses throughout, reproducible chest pain ?Respiratory: CTABL, no w/r/r, normal WOB ?GI: BS+, NDNT, no masses noted, no organomegaly noted ?MSK: No e/c/c ?Neuro: A&O x 3, no focal deficits ?Psyc: Appropriate interaction and affect, calm/cooperative ? ?Data Reviewed: ? ?Na+ 131 ?CO2  21 ?Glucose  365 ?BUN  36 ?Scr  2.27 ?Trp 26 -> 27 ?WBC 12.6 ?Hgb 7.8 ? ?EKG: sinus, minimal st elevation in V1, V2 ? ?CT chest/ab/pelvis w/o: ?1. Stable 9 mm and a subpleural micronodule within the right upper ?lobe. Biapical pleural/pulmonary scarring with nodular like density ?at the right apex measuring 4 mm. Follow-up CT chest in 12-18 months (from today's scan) is considered optional for low-risk patients, but is recommended for high-risk patients. This recommendation follows the consensus statement: Guidelines for Management of Incidental Pulmonary Nodules Detected on CT Images: From the Fleischner Society 2017; Radiology 2017; 284:228-243. ?2. Cardiomegaly. ?3. Findings suggestive of anemia. ?4. No acute intra-abdominal or intrapelvic abnormality in the ?setting of markedly limited evaluation on this noncontrast study. ?5.   Aortic Atherosclerosis (ICD10-I70.0). ? ?Assessment and Plan: ?No notes have been filed under this hospital service. ?Service: Hospitalist ?Chest pain ?    - admitted to inpt, tele ?    - Trp flat; 26 -> 27 ?    - reproducible ?    - EKG as above ?    - check echo ? ??Hemoptysis ?    - reports hemoptysis over the last day or so ?    - check d-dimer, VQ scan if positive ?    - spoke with pulm about her pulm nodules, not likely the source ? ?AKI on CKD3b ?    - hold diuretics and entresto ?    - CT w/o obstruction ?    - fluids today ? ?Pulmonary nodules ?    - spoke with pulm; they will arrange for outpt follow up ? ?DM2,  uncontrolled ?DM neuropathy ?    - A1c, SSI, DM diet, NPH ?    - DM coordinator ?    - continue home neurontin ? ?HTN, uncontrolled ?Chronic systolic HF ?    - continue BB, hold her diuretic and entresto d/t AKI; will have PRN anti-hypertensive ?    - daily wts, I&O ? ?Normocytic anemia ?    - looks like she's iron deficient; replace ? ?Trichomonas infection ?    - UA positive for trichomonas ?    - flagyl 500mg  BID x 7 days ? ?Proteinuria ?    - she has had recent outpt nephrology consult on her recurrent CKD and proteinuria ?    - check protein-creatinine ratio ?    - she says she isn't changing her care to [facility]; but not sure if she will continue to follow with [facility]; may need outpt referral  ? ?Tobacco abuse ?    - counsel against further use ? ? Advance Care Planning:   Code Status: FULL ? ?Consults: Sidebarred PCCM ? ?Family Communication: None at bedside ? ?Severity of Illness: ?The appropriate patient status for this patient is INPATIENT. Inpatient status is judged to be reasonable and necessary in order to provide the required intensity of service to ensure the patient's safety. The patient's presenting symptoms, physical exam findings, and initial radiographic and laboratory data in the context of their chronic comorbidities is felt to place them at high risk for further  clinical deterioration. Furthermore, it is not anticipated that the patient will be medically stable for discharge from the hospital within 2 midnights of admission.  ? ?* I certify that at the point of admission it

## 2021-11-07 NOTE — Telephone Encounter (Signed)
Please schedule patient for follow up with Dr. Valeta Harms or Dr. Lamonte Sakai in the next 3-6 weeks for lung nodule. She is currently at [facility]. ? ?Thanks, ?JD ?

## 2021-11-07 NOTE — ED Notes (Signed)
Walked in to room, patient was up watching TV. Patient is alert and oriented x4. Vitals reassessed.  ?

## 2021-11-07 NOTE — Progress Notes (Signed)
?   11/07/21 1752  ?Provider Notification  ?Provider Name/Title Dr. Marylyn Ishihara  ?Date Provider Notified 11/07/21  ?Time Provider Notified 1752  ?Method of Notification Page  ?Notification Reason Critical result  ?Test performed and critical result CBG [id number]  ?Date Critical Result Received 11/07/21  ?Time Critical Result Received 1738  ?Provider response See new orders  ?Date of Provider Response 11/07/21  ?Time of Provider Response 1758  ? ? ?

## 2021-11-07 NOTE — Telephone Encounter (Signed)
Patient has been scheduled for appt with Dr. Valeta Harms. Nothing further needed at this time. ?Next Appt ?With Pulmonology ([place], DO) ?12/03/2021 at 2:00 PM ?

## 2021-11-07 NOTE — ED Notes (Signed)
Patient ambulated to the bathroom for urine sample. Patient appears short of breath when ambulating. Patient ambulates using a cane and walker at home.  ?

## 2021-11-07 NOTE — ED Notes (Signed)
[facility] called for transport. 

## 2021-11-08 ENCOUNTER — Inpatient Hospital Stay (HOSPITAL_COMMUNITY): Payer: 59

## 2021-11-08 DIAGNOSIS — R079 Chest pain, unspecified: Secondary | ICD-10-CM | POA: Diagnosis not present

## 2021-11-08 DIAGNOSIS — R7989 Other specified abnormal findings of blood chemistry: Secondary | ICD-10-CM

## 2021-11-08 LAB — CBC WITH DIFFERENTIAL/PLATELET
Abs Immature Granulocytes: 0.04 10*3/uL (ref 0.00–0.07)
Basophils Absolute: 0 10*3/uL (ref 0.0–0.1)
Basophils Relative: 0 %
Eosinophils Absolute: 0.1 10*3/uL (ref 0.0–0.5)
Eosinophils Relative: 1 %
HCT: 23 % — ABNORMAL LOW (ref 36.0–46.0)
Hemoglobin: 7.7 g/dL — ABNORMAL LOW (ref 12.0–15.0)
Immature Granulocytes: 0 %
Lymphocytes Relative: 20 %
Lymphs Abs: 2.2 10*3/uL (ref 0.7–4.0)
MCH: 28.5 pg (ref 26.0–34.0)
MCHC: 33.5 g/dL (ref 30.0–36.0)
MCV: 85.2 fL (ref 80.0–100.0)
Monocytes Absolute: 1 10*3/uL (ref 0.1–1.0)
Monocytes Relative: 10 %
Neutro Abs: 7.6 10*3/uL (ref 1.7–7.7)
Neutrophils Relative %: 69 %
Platelets: 271 10*3/uL (ref 150–400)
RBC: 2.7 MIL/uL — ABNORMAL LOW (ref 3.87–5.11)
RDW: 13.6 % (ref 11.5–15.5)
WBC: 11 10*3/uL — ABNORMAL HIGH (ref 4.0–10.5)
nRBC: 0 % (ref 0.0–0.2)

## 2021-11-08 LAB — COMPREHENSIVE METABOLIC PANEL
ALT: 9 U/L (ref 0–44)
AST: 11 U/L — ABNORMAL LOW (ref 15–41)
Albumin: 1.8 g/dL — ABNORMAL LOW (ref 3.5–5.0)
Alkaline Phosphatase: 91 U/L (ref 38–126)
Anion gap: 8 (ref 5–15)
BUN: 43 mg/dL — ABNORMAL HIGH (ref 6–20)
CO2: 22 mmol/L (ref 22–32)
Calcium: 8 mg/dL — ABNORMAL LOW (ref 8.9–10.3)
Chloride: 104 mmol/L (ref 98–111)
Creatinine, Ser: 2.49 mg/dL — ABNORMAL HIGH (ref 0.44–1.00)
GFR, Estimated: 22 mL/min — ABNORMAL LOW (ref >60)
Glucose, Bld: 64 mg/dL — ABNORMAL LOW (ref 70–99)
Potassium: 3.3 mmol/L — ABNORMAL LOW (ref 3.5–5.1)
Sodium: 134 mmol/L — ABNORMAL LOW (ref 135–145)
Total Bilirubin: 0.5 mg/dL (ref 0.3–1.2)
Total Protein: 5.2 g/dL — ABNORMAL LOW (ref 6.5–8.1)

## 2021-11-08 LAB — ECHOCARDIOGRAM COMPLETE
AR max vel: 3.23 cm2
AV Peak grad: 7.3 mmHg
Ao pk vel: 1.35 m/s
Area-P 1/2: 3.23 cm2
Height: 67 in
S' Lateral: 3.4 cm
Weight: 1918.88 oz

## 2021-11-08 LAB — LIPID PANEL
Cholesterol: 144 mg/dL (ref 0–200)
HDL: 43 mg/dL (ref >40)
LDL Cholesterol: 86 mg/dL (ref 0–99)
Total CHOL/HDL Ratio: 3.3 RATIO
Triglycerides: 76 mg/dL (ref <150)
VLDL: 15 mg/dL (ref 0–40)

## 2021-11-08 LAB — GLUCOSE, CAPILLARY
Glucose-Capillary: 70 mg/dL (ref 70–99)
Glucose-Capillary: 135 mg/dL — ABNORMAL HIGH (ref 70–99)
Glucose-Capillary: 158 mg/dL — ABNORMAL HIGH (ref 70–99)
Glucose-Capillary: 137 mg/dL — ABNORMAL HIGH (ref 70–99)

## 2021-11-08 IMAGING — CR DG CHEST 1V
1 series · 1 of 1 positions shown · non-contrast
Comparison: [DATE]

CLINICAL DATA: Mid chest pain.

EXAM:
CHEST  1 VIEW

[w chest pa]
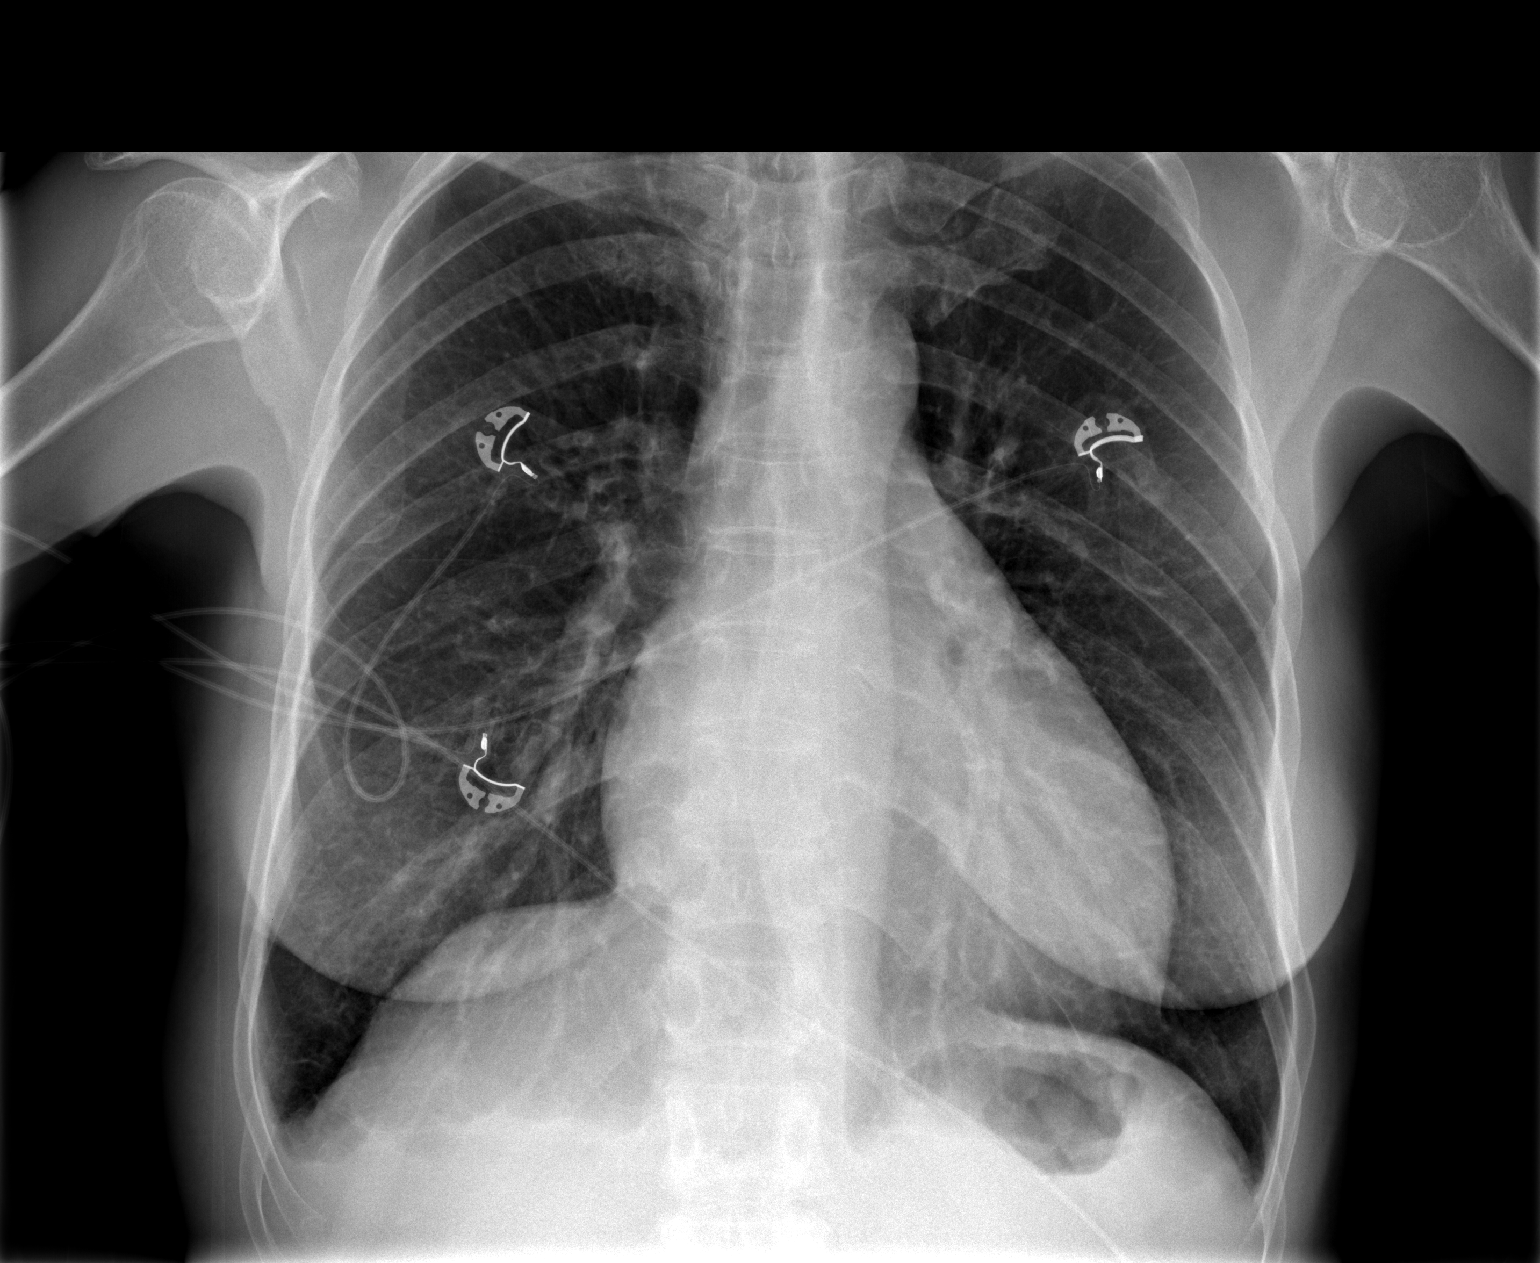

[1 of 1 positions shown; findings below may reference images not displayed]

FINDINGS: 9 mm right upper lobe pulmonary nodule not well delineated on the
current exam and better seen on the CT of the chest dated
[DATE]. Lungs are hyperinflated as can be seen with COPD. No
focal consolidation. No pleural effusion or pneumothorax. Heart and
mediastinal contours are unremarkable.

No acute osseous abnormality.
IMPRESSION: 1. No acute cardiopulmonary disease.
2. A 9 mm right upper lobe pulmonary nodule not well delineated on
the current exam and better seen on the CT of the chest dated
[DATE].

## 2021-11-08 MED ORDER — SODIUM CHLORIDE 0.9 % IV SOLN: INTRAVENOUS | Status: DC

## 2021-11-08 MED ORDER — ROSUVASTATIN CALCIUM 20 MG PO TABS
20.0000 mg | ORAL_TABLET | Freq: Every day | ORAL | Status: DC
  Administered 2021-11-08 – 2021-11-09 (×2): 20 mg via ORAL
  Filled 2021-11-08 (×2): qty 1

## 2021-11-08 MED ORDER — INSULIN DETEMIR 100 UNIT/ML ~~LOC~~ SOLN
4.0000 [IU] | Freq: Two times a day (BID) | SUBCUTANEOUS | Status: DC
  Administered 2021-11-08 – 2021-11-09 (×2): 4 [IU] via SUBCUTANEOUS
  Filled 2021-11-08 (×3): qty 0.04

## 2021-11-08 MED ORDER — AMLODIPINE BESYLATE 5 MG PO TABS
5.0000 mg | ORAL_TABLET | Freq: Every day | ORAL | Status: DC
  Administered 2021-11-08 – 2021-11-09 (×2): 5 mg via ORAL
  Filled 2021-11-08 (×2): qty 1

## 2021-11-08 MED ORDER — INSULIN ASPART 100 UNIT/ML IJ SOLN
2.0000 [IU] | Freq: Three times a day (TID) | INTRAMUSCULAR | Status: DC
  Administered 2021-11-08 – 2021-11-09 (×2): 2 [IU] via SUBCUTANEOUS

## 2021-11-08 MED ORDER — TECHNETIUM TO 99M ALBUMIN AGGREGATED
4.1600 | Freq: Once | INTRAVENOUS | Status: AC | PRN
  Administered 2021-11-08: 4.16 via INTRAVENOUS

## 2021-11-08 MED ORDER — LOSARTAN POTASSIUM 50 MG PO TABS: 100.0000 mg | ORAL_TABLET | Freq: Every day | ORAL | Status: DC

## 2021-11-08 MED ORDER — POTASSIUM CHLORIDE CRYS ER 20 MEQ PO TBCR
40.0000 meq | EXTENDED_RELEASE_TABLET | Freq: Once | ORAL | Status: AC
  Administered 2021-11-08: 40 meq via ORAL
  Filled 2021-11-08: qty 2

## 2021-11-08 NOTE — Progress Notes (Signed)
Inpatient Diabetes Program Recommendations ? ?AACE/ADA: New Consensus Statement on Inpatient Glycemic Control (2015) ? ?Target Ranges:  Prepandial:   less than 140 mg/dL ?     Peak postprandial:   less than 180 mg/dL (1-2 hours) ?     Critically ill patients:  140 - 180 mg/dL  ? ?Lab Results  ?Component Value Date  ? GLUCAP 70 11/08/2021  ? HGBA1C 9.6 (H) 11/07/2021  ? ? ?Review of Glycemic Control ? ?Diabetes history: DM2 ?Outpatient Diabetes medications: Novolin 70/30 6-12 units (s/s) at lunch and dinner ?Current orders for Inpatient glycemic control: Novolin N 6 units BID, Novolog 0-15 units TID with meals and 0-5 HS ? ?HgbA1C - 9.6% ?Hypo of 70, 64 this am ? ?Inpatient Diabetes Program Recommendations:   ? ?D/C Novolin N and add Levemir 4 units BID ?Add Novolog 2 units TID with meals if eating > 50% meal ? ?Will see pt after lunch to discuss her diabetes control and HgbA1C of 9.6% along with home regimen. ? ?Continue to closely follow. ? ?Thank you. ?Lorenda Peck, RD, LDN, CDE ?Inpatient Diabetes Coordinator ?[phone]  ? ? ? ? ? ? ?

## 2021-11-08 NOTE — Progress Notes (Signed)
Received report from [facility]. Agree with previous assessment ?

## 2021-11-08 NOTE — Progress Notes (Signed)
Bilateral lower extremity venous duplex completed. ?Refer to "CV Proc" under chart review to view preliminary results. ? ?11/08/2021 10:31 AM ?Kelby Aline., MHA, RVT, RDCS, RDMS   ?

## 2021-11-08 NOTE — Progress Notes (Addendum)
?PROGRESS NOTE ? ? ? ?Amber Cross  YIR:[id number] DOB: September 06, 1960 DOA: 11/06/2021 ?PCP: Pcp, No  ? ?  ?Brief Narrative:  ?Amber Cross is a 61 y.o. female with medical history significant of DM2, HTN, HLD, tobacco abuse, anxiety, chronic systolic HF. Presenting with chest pain. She reports that she's had chest pain on and off for the last several months. She has had follow up with cardiology on this matter. She reports 2 nights ago her pain and breathing worsened, so she decided to go to the ED yesterday morning. She reports that she has had a couple of episodes of hemoptysis. She otherwise denies any aggravating or alleviating factors.  ? ?Chart reviewed from patient's follow-up with outpatient cardiology Dr. Delice Bison at [place].  Patient has had constant and chronic chest pain.  Work-up has been largely unremarkable, thought to have chest wall discomfort.  She has follow-up in 3 months. ? ?Patient also with small pulmonary nodules.  Patient set up with outpatient pulmonology for follow-up. ? ?New events last 24 hours / Subjective: ?States that she is feeling a little bit better since being admitted to the hospital.  No cough, hemoptysis.  Continues to have chest pain that she is describes as a squeezing bandlike pain across her chest. ? ?Assessment & Plan: ?  ? ?Principal Problem: ?  Chest pain ?Active Problems: ?  Type 2 diabetes mellitus with peripheral neuropathy ([place]) ?  Essential hypertension ?  Tobacco use ?  Hyperlipidemia LDL goal <100 ?  Hypertension ?  AKI (acute kidney injury) ([place]) ?  Chronic systolic CHF (congestive heart failure) ([place]) ?  Pulmonary nodules ?  Hemoptysis ?  Normocytic anemia ?  Proteinuria ?  Stage 3b chronic kidney disease (CKD) ([place]) ?  Trichomonas infection ? ? ?Noncardiac chest pain ?-D-dimer was elevated.  Venous Doppler as well as VQ scan were negative for VTE ?-Troponin remained flat 27 --> 19 --> 19 ?-Echocardiogram is pending ? ?AKI on CKD stage IIIb ?-Baseline creatinine  1.7 ?-IVF x 10 hours  ?-Hold losartan  ? ?Chronic systolic heart failure ?-Entresto and Aldactone were discontinued by outpatient nephrology due to renal insufficiency ? ?Diabetes mellitus type 2, insulin-dependent, uncontrolled with hyperglycemia ?-A1c 9.6 ?-Appreciate diabetic coordinator ?-Levemir, NovoLog, sliding scale insulin ? ?Pulmonary nodules ?-Stable 9 mm and a subpleural micronodule within the right upper lobe. Biapical pleural/pulmonary scarring with nodular like density at the right apex measuring 4 mm. ?-Follow-up with Dr. Valeta Harms 5/8 ? ?Hypertension ?-Coreg, norvasc  ?-Hold losartan  ? ?Hyperlipidemia ?-Crestor ? ?Trichomonas ?-Flagyl ? ?Hypokalemia ?-Replace, trend ? ? ?DVT prophylaxis:  ?heparin injection 5,000 Units Start: 11/07/21 1445 ? ?Code Status: Full ?Family Communication: None at bedside  ?Disposition Plan:  ?Status is: Inpatient ?Remains inpatient appropriate because: echo pending. IVF for AKI  ? ?Antimicrobials:  ?Anti-infectives (From admission, onward)  ? ? Start     Dose/Rate Route Frequency Ordered Stop  ? 11/07/21 1415  metroNIDAZOLE (FLAGYL) tablet 500 mg       ? 500 mg Oral Every 12 hours 11/07/21 1315 11/14/21 0959  ? ?  ? ? ? ?Objective: ?Vitals:  ? 11/07/21 2224 11/08/21 0029 11/08/21 0447 11/08/21 1256  ?BP: 129/73 129/68 132/70 (!) 161/66  ?Pulse: 75 71 70 65  ?Resp: 18 18 18 18   ?Temp: 98.4 ?F (36.9 ?C) 98.5 ?F (36.9 ?C) 97.9 ?F (36.6 ?C) 97.6 ?F (36.4 ?C)  ?TempSrc: Oral Oral Oral Oral  ?SpO2: 100% 98% 98% 97%  ?Weight:      ?Height:      ? ? ?  Intake/Output Summary (Last 24 hours) at 11/08/2021 1320 ?Last data filed at 11/07/2021 1800 ?Gross per 24 hour  ?Intake 211.56 ml  ?Output --  ?Net 211.56 ml  ? ?Filed Weights  ? 11/06/21 1540 11/07/21 1238  ?Weight: 54.4 kg 54.4 kg  ? ? ?Examination:  ?General exam: Appears calm and comfortable  ?Respiratory system: Clear to auscultation. Respiratory effort normal. No respiratory distress. No conversational dyspnea.  ?Cardiovascular  system: S1 & S2 heard, RRR. No murmurs. No pedal edema. ?Gastrointestinal system: Abdomen is nondistended, soft and nontender. Normal bowel sounds heard. ?Central nervous system: Alert and oriented. No focal neurological deficits. Speech clear.  ?Extremities: Symmetric in appearance  ?Skin: No rashes, lesions or ulcers on exposed skin  ?Psychiatry: Judgement and insight appear normal. Mood & affect appropriate.  ? ?Data Reviewed: I have personally reviewed following labs and imaging studies ? ?CBC: ?Recent Labs  ?Lab 11/06/21 ?1552 11/07/21 ?1108 11/08/21 ?1610  ?WBC 15.3* 12.6* 11.0*  ?NEUTROABS  --   --  7.6  ?HGB 8.9* 7.8* 7.7*  ?HCT 25.7* 22.5* 23.0*  ?MCV 83.4 84.0 85.2  ?PLT 263 245 271  ? ?Basic Metabolic Panel: ?Recent Labs  ?Lab 11/06/21 ?1552 11/06/21 ?1732 11/07/21 ?1108 11/08/21 ?9604  ?NA 135  --  131* 134*  ?K 2.8*  --  3.5 3.3*  ?CL 102  --  103 104  ?CO2 23  --  21* 22  ?GLUCOSE 268*  --  364* 64*  ?BUN 37*  --  36* 43*  ?CREATININE 2.46*  --  2.27* 2.49*  ?CALCIUM 8.4*  --  7.6* 8.0*  ?MG  --  1.9  --   --   ? ?GFR: ?Estimated Creatinine Clearance: 20.9 mL/min (A) (by C-G formula based on SCr of 2.49 mg/dL (H)). ?Liver Function Tests: ?Recent Labs  ?Lab 11/06/21 ?1732 11/08/21 ?0733  ?AST 14* 11*  ?ALT 11 9  ?ALKPHOS 118 91  ?BILITOT 0.4 0.5  ?PROT 6.2* 5.2*  ?ALBUMIN 2.1* 1.8*  ? ?Recent Labs  ?Lab 11/06/21 ?1732  ?LIPASE 21  ? ?No results for input(s): AMMONIA in the last 168 hours. ?Coagulation Profile: ?No results for input(s): INR, PROTIME in the last 168 hours. ?Cardiac Enzymes: ?No results for input(s): CKTOTAL, CKMB, CKMBINDEX, TROPONINI in the last 168 hours. ?BNP (last 3 results) ?No results for input(s): PROBNP in the last 8760 hours. ?HbA1C: ?Recent Labs  ?  11/07/21 ?1312  ?HGBA1C 9.6*  ? ?CBG: ?Recent Labs  ?Lab 11/07/21 ?1304 11/07/21 ?1734 11/07/21 ?2221 11/08/21 ?0732 11/08/21 ?1138  ?GLUCAP 371* 428* 178* 70 135*  ? ?Lipid Profile: ?Recent Labs  ?  11/08/21 ?0501  ?CHOL 144  ?HDL  43  ?[place] 86  ?TRIG 76  ?CHOLHDL 3.3  ? ?Thyroid Function Tests: ?No results for input(s): TSH, T4TOTAL, FREET4, T3FREE, THYROIDAB in the last 72 hours. ?Anemia Panel: ?Recent Labs  ?  11/06/21 ?1732  ?FERRITIN 395*  ?TIBC 182*  ?IRON 15*  ? ?Sepsis Labs: ?No results for input(s): PROCALCITON, LATICACIDVEN in the last 168 hours. ? ?Recent Results (from the past 240 hour(s))  ?Resp Panel by RT-PCR (Flu A&B, Covid) Nasopharyngeal Swab     Status: None  ? Collection Time: 11/06/21  5:32 PM  ? Specimen: Nasopharyngeal Swab; Nasopharyngeal(NP) swabs in vial transport medium  ?Result Value Ref Range Status  ? SARS Coronavirus 2 by RT PCR NEGATIVE NEGATIVE Final  ?  Comment: (NOTE) ?SARS-CoV-2 target nucleic acids are NOT DETECTED. ? ?The SARS-CoV-2 RNA is generally detectable in  upper respiratory ?specimens during the acute phase of infection. The lowest ?concentration of SARS-CoV-2 viral copies this assay can detect is ?138 copies/mL. A negative result does not preclude SARS-Cov-2 ?infection and should not be used as the sole basis for treatment or ?other patient management decisions. A negative result may occur with  ?improper specimen collection/handling, submission of specimen other ?than nasopharyngeal swab, presence of viral mutation(s) within the ?areas targeted by this assay, and inadequate number of viral ?copies(<138 copies/mL). A negative result must be combined with ?clinical observations, patient history, and epidemiological ?information. The expected result is Negative. ? ?Fact Sheet for Patients:  ?EntrepreneurPulse.com.au ? ?Fact Sheet for Healthcare Providers:  ?IncredibleEmployment.be ? ?This test is no t yet approved or cleared by the [place] FDA and  ?has been authorized for detection and/or diagnosis of SARS-CoV-2 by ?FDA under an Emergency Use Authorization (EUA). This EUA will remain  ?in effect (meaning this test can be used) for the duration of  the ?COVID-19 declaration under Section 564(b)(1) of the Act, 21 ?U.S.C.section 360bbb-3(b)(1), unless the authorization is terminated  ?or revoked sooner.  ? ? ?  ? Influenza A by PCR NEGATIVE NEGATIVE Final  ? In

## 2021-11-09 ENCOUNTER — Ambulatory Visit: Payer: Managed Care, Other (non HMO) | Admitting: Student

## 2021-11-09 DIAGNOSIS — R079 Chest pain, unspecified: Secondary | ICD-10-CM | POA: Diagnosis not present

## 2021-11-09 LAB — MAGNESIUM: Magnesium: 2.3 mg/dL (ref 1.7–2.4)

## 2021-11-09 LAB — BASIC METABOLIC PANEL
Anion gap: 5 (ref 5–15)
BUN: 42 mg/dL — ABNORMAL HIGH (ref 6–20)
CO2: 23 mmol/L (ref 22–32)
Calcium: 8 mg/dL — ABNORMAL LOW (ref 8.9–10.3)
Chloride: 108 mmol/L (ref 98–111)
Creatinine, Ser: 2.48 mg/dL — ABNORMAL HIGH (ref 0.44–1.00)
GFR, Estimated: 22 mL/min — ABNORMAL LOW (ref >60)
Glucose, Bld: 60 mg/dL — ABNORMAL LOW (ref 70–99)
Potassium: 3.9 mmol/L (ref 3.5–5.1)
Sodium: 136 mmol/L (ref 135–145)

## 2021-11-09 LAB — CBC
HCT: 22.7 % — ABNORMAL LOW (ref 36.0–46.0)
Hemoglobin: 7.5 g/dL — ABNORMAL LOW (ref 12.0–15.0)
MCH: 28.6 pg (ref 26.0–34.0)
MCHC: 33 g/dL (ref 30.0–36.0)
MCV: 86.6 fL (ref 80.0–100.0)
Platelets: 307 10*3/uL (ref 150–400)
RBC: 2.62 MIL/uL — ABNORMAL LOW (ref 3.87–5.11)
RDW: 13.6 % (ref 11.5–15.5)
WBC: 8.9 10*3/uL (ref 4.0–10.5)
nRBC: 0 % (ref 0.0–0.2)

## 2021-11-09 LAB — GLUCOSE, CAPILLARY
Glucose-Capillary: 165 mg/dL — ABNORMAL HIGH (ref 70–99)
Glucose-Capillary: 231 mg/dL — ABNORMAL HIGH (ref 70–99)

## 2021-11-09 MED ORDER — DEXTROSE 50 % IV SOLN
12.5000 g | INTRAVENOUS | Status: AC
  Administered 2021-11-09: 12.5 g via INTRAVENOUS
  Filled 2021-11-09: qty 50

## 2021-11-09 MED ORDER — POLYETHYLENE GLYCOL 3350 17 G PO PACK: 17.0000 g | PACK | Freq: Every day | ORAL | 0 refills | Status: DC

## 2021-11-09 MED ORDER — METRONIDAZOLE 500 MG PO TABS: 500.0000 mg | ORAL_TABLET | Freq: Two times a day (BID) | ORAL | 0 refills | Status: AC

## 2021-11-09 MED ORDER — INSULIN DETEMIR 100 UNIT/ML ~~LOC~~ SOLN: 4.0000 [IU] | Freq: Every day | SUBCUTANEOUS | Status: DC

## 2021-11-09 MED ORDER — AMLODIPINE BESYLATE 5 MG PO TABS: 5.0000 mg | ORAL_TABLET | Freq: Every day | ORAL | 1 refills | Status: DC

## 2021-11-09 MED ORDER — NOVOLIN 70/30 FLEXPEN RELION (70-30) 100 UNIT/ML ~~LOC~~ SUPN: 4.0000 [IU] | PEN_INJECTOR | Freq: Two times a day (BID) | SUBCUTANEOUS | 0 refills | Status: DC

## 2021-11-09 MED ORDER — POLYETHYLENE GLYCOL 3350 17 G PO PACK: 17.0000 g | PACK | Freq: Every day | ORAL | Status: DC

## 2021-11-09 NOTE — Discharge Instructions (Signed)
Heart-Healthy Nutrition Therapy ? ?A heart-healthy diet is recommended to reduce your unhealthy blood cholesterol levels, manage high blood pressure, and lower your risk for heart disease. ?To follow a heart-healthy diet, ?Eat a balanced diet with whole grains, fruits and vegetables, and lean protein sources. ?Achieve and maintain a healthy weight. ?Choose heart-healthy unsaturated fats. Limit saturated fats, trans fats, and cholesterol intake. Eat more plant-based or vegetarian meals using beans and soy foods for protein. ?Eat whole, unprocessed foods to limit the amount of sodium (salt) you eat. ?Limit refined carbohydrates especially sugar, sweets and sugar-sweetened beverages. ?If you drink alcohol, do so in moderation: one serving per day (women) and two servings per day (men). ?One serving is equivalent to 12 ounces beer, 5 ounces wine, or 1.5 ounces distilled spirits ? ?Tips ?Tips for Choosing Heart-Healthy Fats ?Choose lean protein and low-fat dairy foods to reduce saturated fat intake. ?Saturated fat is usually found in animal-based protein and is associated with certain health risks. Saturated fat is the biggest contributor to raised low-density lipoprotein (LDL) cholesterol levels in the diet. Research shows that limiting saturated fat lowers unhealthy cholesterol levels. Eat no more than 5-6% of your total calories each day from saturated fat. Ask your registered dietitian nutritionist (RDN) to help you determine how much saturated fat is right for you. ?There are many foods that do not contain large amounts of saturated fats. Swapping these foods to replace foods high in saturated fats will help you limit the saturated fat you eat and improve your cholesterol levels. You can also try eating more plant-based or vegetarian meals. ?Instead of? Try:  ?Whole milk, cheese, yogurt, and ice cream 1%, ?%, or skim milk, low-fat cheese, non-fat yogurt, and low-fat ice cream  ?Fatty, marbled beef and pork Lean  beef, pork, or venison  ?Poultry with skin Poultry without skin  ?Butter, stick margarine Reduced-fat, whipped, or liquid spreads  ?Coconut oil, palm oil Liquid vegetable oils: corn, canola, olive, soybean and safflower oils  ? ?Avoid trans fats. ?Trans fats increase levels of LDL-cholesterol. Hydrogenated fat in processed foods is the main source of trans fats in foods.  ?Trans fats can be found in stick margarine, shortening, processed sweets, baked goods, some fried foods, and packaged foods made with hydrogenated oils. Avoid foods with ?partially hydrogenated oil? on the ingredient list such as: cookies, pastries, baked goods, biscuits, crackers, microwave popcorn, and frozen dinners. ?Choose foods with heart healthy fats. ?Polyunsaturated and monounsaturated fat are unsaturated fats that may help lower your blood cholesterol level when used in place of saturated fat in your diet. ?Ask your RDN about taking a dietary supplement with plant sterols and stanols to help lower your cholesterol level. ?Research shows that substituting saturated fats with unsaturated fats is beneficial to cholesterol levels. Try these easy swaps: ?  ?Instead of? Try:  ?Butter, stick margarine, or solid shortening Reduced-fat, whipped, or liquid spreads  ?Beef, pork, or poultry with skin ?  Fish and seafood  ?Chips, crackers, snack foods Raw or unsalted nuts and seeds or nut butters ?Hummus with vegetables ?Avocado on toast  ?Coconut oil, palm oil Liquid vegetable oils: corn, canola, olive, soybean and safflower oils  ?  ?Limit the amount of cholesterol you eat to less than 200 milligrams per day. ?Cholesterol is a substance carried through the bloodstream via lipoproteins, which are known as ?transporters? of fat. Some body functions need cholesterol to work properly, but too much cholesterol in the bloodstream can damage arteries and build up blood  vessel linings (which can lead to heart attack and stroke). You should eat less than  200 milligrams cholesterol per day. ?People respond differently to eating cholesterol. There is no test available right now that can figure out which people will respond more to dietary cholesterol and which will respond less. For individuals with high intake of dietary cholesterol, different types of increase (none, small, moderate, large) in LDL-cholesterol levels are all possible.   ?Food sources of cholesterol include egg yolks and organ meats such as liver, gizzards.  Limit egg yolks to two to four per week and avoid organ meats like liver and gizzards to control cholesterol intake. ? ?Tips for Choosing Heart-Healthy Carbohydrates ?Consume foods rich in viscous (soluble) fiber ?Viscous, or soluble, fiber is found in the walls of plant cells. Viscous fiber is found only in plant-based foods--animal-based foods like meat or dairy products do not contain fiber. In the stomach, viscous fibers absorb water and swell to form a thick, jelly-like mass. This helps to lower your unhealthy cholesterol ?Rich sources of viscous fiber include asparagus, Brussels sprouts, sweet potatoes, turnips, apricots, mangoes, oranges, legumes, barley, oats, and oat bran. ?Eat at least 5 to 10 grams of viscous fiber each day. As you increase your fiber intake gradually, also increase the amount of water you drink. This will help prevent constipation. ?If you have difficulty achieving this goal, ask your RDN about fiber laxatives. Choose fiber supplements made with viscous fibers such as psyllium seed husks or methylcellulose to help lower unhealthy cholesterol. ?Limit refined carbohydrates ?There are three types of carbohydrates: starches, sugar, and fiber. Some carbohydrates occur naturally in food, like the starches in rice or corn or the sugars in fruits and milk. Refined carbohydrates--foods with high amounts of simple sugars--can raise triglyceride levels. High triglyceride levels are associated with coronary heart disease. ?Some  examples of refined carbohydrate foods are table sugar, sweets, and beverages sweetened with added sugar. ?Tips for Reducing Sodium (Salt) ?Although sodium is important for your body to function, too much sodium can be harmful for people with high blood pressure.  As sodium and fluid buildup in your tissues and bloodstream, your blood pressure increases. High blood pressure may cause damage to other organs and increase your risk for a stroke. ?Keep your salt intake to 2300 milligrams or less per day. Even if you take a pill for blood pressure or a water pill (diuretic) to remove fluid, it is still important to have less salt in your diet. Ask your RDN what amount of sodium is right for you. ?Avoid processed foods.  Eat more fresh foods. ?Fresh fruits and vegetables are naturally low in sodium, as well as frozen vegetables and fruits that have no added juices or sauces. ?Fresh meats are lower in sodium than processed meats, such as bacon, sausage, and hotdogs.  Read the nutrition label or ask your butcher to help you find a fresh meat that is low in sodium. ?Eat less salt--at the table and when cooking. ?A single teaspoon of table salt has 2,300 mg of sodium. ?Leave the salt out of recipes for pasta, casseroles, and soups. ?Ask your RDN how to cook your favorite recipes without sodium ?Be a Paramedic. ?Look for food packages that say ?salt-free? or ?sodium-free.? These items contain less than 5 milligrams of sodium per serving. ??Very low-sodium? products contain less than 35 milligrams of sodium per serving. ??Low-sodium? products contain less than 140 milligrams of sodium per serving.  ?Beware of ?reduced salt? or "reduced  sodium" products.  These items may still be high in sodium. Check the nutrition label. ? Add flavors to your food without adding sodium. ?Try lemon juice, lime juice, fruit juice or vinegar.   ?Dry or fresh herbs add flavor. Try basil, bay leaf, dill, rosemary, parsley, sage, dry mustard,  nutmeg, thyme, and paprika. ?Pepper, red pepper flakes, and cayenne pepper can add spice to your meals without adding sodium. Hot sauce contains sodium, but if you use just a drop or two, it will not add up to m

## 2021-11-09 NOTE — Progress Notes (Signed)
NUTRITION NOTE ? ?Consult received for request for heart healthy diet education. Discharge order and discharge summary already entered. RD unlikely to be able to make it to patient's room prior to her leaving. ? ?Alerted RN that RD placing handouts in Discharge Instructions/AVS. ? ? ? ? ? ?Jarome Matin, MS, RD, LDN ?Registered Dietitian II ?Inpatient Clinical Nutrition ?RD pager # and on-call/weekend pager # available in [place]  ? ?

## 2021-11-09 NOTE — Progress Notes (Signed)
?  Transition of Care (TOC) Screening Note ? ? ?Patient Details  ?Name: Amber Cross ?Date of Birth: Mar 12, 1961 ? ? ?Transition of Care [facility]) CM/SW Contact:    ?Dessa Phi, RN ?Phone Number: ?11/09/2021, 11:16 AM ? ? ? ?Transition of Care Department [facility]) has reviewed patient and no TOC needs have been identified at this time. We will continue to monitor patient advancement through interdisciplinary progression rounds. If new patient transition needs arise, please place a TOC consult. ?  ?

## 2021-11-09 NOTE — Progress Notes (Signed)
IV removed ?Medication schedule/dosing reviewed in detail. ?Upcoming appointments reviewed. ?Heart healthy diet education added to pt AVS. ?

## 2021-11-09 NOTE — Progress Notes (Signed)
Inpatient Diabetes Program Recommendations ? ?AACE/ADA: New Consensus Statement on Inpatient Glycemic Control (2015) ? ?Target Ranges:  Prepandial:   less than 140 mg/dL ?     Peak postprandial:   less than 180 mg/dL (1-2 hours) ?     Critically ill patients:  140 - 180 mg/dL  ? ?Lab Results  ?Component Value Date  ? GLUCAP 165 (H) 11/09/2021  ? HGBA1C 9.6 (H) 11/07/2021  ? ? ?Review of Glycemic Control ? ?Diabetes history: DM2 ? ?Current orders for Inpatient glycemic control: Levemir 4 units QD, Novolog 0-15 units TID with meals and 0-5 HS + 2 units TID ? ?HgbA1C 9.6% ? ?Inpatient Diabetes Program Recommendations:   ? ?Spoke with pt on 4/14 about her diabetes control and HgbA1C of 9.6%. Also spoke with pt on phone this am to see if she had any other questions or concerns about checking her blood sugars more frequently. Pt appeared to be upset on the phone and pt states it was "family stuff." Reminded pt to f/u with PCP and take blood sugar logs for review. Pt voiced understanding. ? ?Thank you. ?Lorenda Peck, RD, LDN, CDE ?Inpatient Diabetes Coordinator ?[phone]  ? ? ? ? ?

## 2021-11-09 NOTE — Discharge Summary (Addendum)
Physician Discharge Summary  ?Amber Cross TKZ:[id number] DOB: Dec 07, 1960 DOA: 11/06/2021 ? ?PCP: [name], DO ? ?Admit date: 11/06/2021 ?Discharge date: 11/09/2021 ? ?Admitted From: home ?Disposition:  home ? ?Recommendations for Outpatient Follow-up:  ?Follow up with PCP in 1 week ?She will need to establish with cardiology and nephrology through [facility] system as patient is transferring her care from [place] ?Repeat CBC and BMP in 1 week to ensure stability of hemoglobin and creatinine ?Follow-up with pulmonologist Dr. Valeta Harms as scheduled ? ?Discharge Condition: Stable ?CODE STATUS: Full code ?Diet recommendation: Carb modified/heart healthy ? ?Brief/Interim Summary: ?Amber Cross is a 61 y.o. female with medical history significant of DM2, HTN, HLD, tobacco abuse, anxiety, chronic systolic HF. Presenting with chest pain. She reports that she's had chest pain on and off for the last several months. She has had follow up with cardiology on this matter. She reports 2 nights ago her pain and breathing worsened, so she decided to go to the ED yesterday morning. She reports that she has had a couple of episodes of hemoptysis. She otherwise denies any aggravating or alleviating factors.  ?  ?Chart reviewed from patient's follow-up with outpatient cardiology Dr. Delice Bison at [facility].  Patient has had constant and chronic chest pain.  Work-up has been largely unremarkable, thought to have chest wall discomfort.  Work-up in the hospital with troponin, echocardiogram, D-dimer, venous Doppler and VQ scan were largely unremarkable. ?  ?Patient also with small pulmonary nodules.  Patient set up with outpatient pulmonology for follow-up. ? ?Discharge Diagnoses:  ? ?Principal Problem: ?  Chest pain ?Active Problems: ?  Type 2 diabetes mellitus with peripheral neuropathy ([place]) ?  Essential hypertension ?  Tobacco use ?  Hyperlipidemia LDL goal <100 ?  Hypertension ?  AKI (acute kidney injury) ([place]) ?  Chronic systolic CHF  (congestive heart failure) ([place]) ?  Pulmonary nodules ?  Hemoptysis ?  Normocytic anemia ?  Proteinuria ?  Stage 3b chronic kidney disease (CKD) ([place]) ?  Trichomonas infection ? ? ?Noncardiac chest pain ?-D-dimer was elevated.  Venous Doppler as well as VQ scan were negative for VTE ?-Troponin remained flat 27 --> 19 --> 19 ?-Echocardiogram is without wall motion abnormalities ?-Likely MSK etiology  ?  ?AKI on CKD stage IIIb ?-Baseline creatinine 1.7 ?-Patient's creatinine has remained stable over the last 3 days 2.46, 2.27, 2.49.  This could be her new baseline going forward.  I will continue to hold losartan ?-Patient is eating and drinking well, having good urine output 1500 mL last 24 hours ? ?Chronic systolic heart failure ?-Entresto and Aldactone were discontinued by outpatient nephrology due to renal insufficiency ?-Does not appear to be fluid overloaded at this time ?  ?Diabetes mellitus type 2, insulin-dependent, uncontrolled with hyperglycemia ?-A1c 9.6 ?-Appreciate diabetic coordinator ?-Continue home meds  ?  ?Pulmonary nodules ?-Stable 9 mm and a subpleural micronodule within the right upper lobe. Biapical pleural/pulmonary scarring with nodular like density at the right apex measuring 4 mm. ?-Follow-up with Dr. Valeta Harms 5/8 ?  ?Hypertension ?-Coreg, norvasc  ?-Hold losartan  ?  ?Hyperlipidemia ?-Crestor ?  ?Trichomonas ?-Flagyl ? ?Normocytic anemia ?-Anemia of chronic disease. Patient without any overt signs of bleeding. ?  ? ?Discharge Instructions ? ?Discharge Instructions   ? ? (HEART FAILURE PATIENTS) Call MD:  Anytime you have any of the following symptoms: 1) 3 pound weight gain in 24 hours or 5 pounds in 1 week 2) shortness of breath, with or without a dry hacking cough 3)  swelling in the hands, feet or stomach 4) if you have to sleep on extra pillows at night in order to breathe.   Complete by: As directed ?  ? Ambulatory referral to Cardiology   Complete by: As directed ?  ? Ambulatory referral  to Nephrology   Complete by: As directed ?  ? Call MD for:  difficulty breathing, headache or visual disturbances   Complete by: As directed ?  ? Call MD for:  extreme fatigue   Complete by: As directed ?  ? Call MD for:  persistant dizziness or light-headedness   Complete by: As directed ?  ? Call MD for:  persistant nausea and vomiting   Complete by: As directed ?  ? Call MD for:  severe uncontrolled pain   Complete by: As directed ?  ? Call MD for:  temperature >100.4   Complete by: As directed ?  ? Diet Carb Modified   Complete by: As directed ?  ? Discharge instructions   Complete by: As directed ?  ? You were cared for by a hospitalist during your hospital stay. If you have any questions about your discharge medications or the care you received while you were in the hospital after you are discharged, you can call the unit and ask to speak with the hospitalist on call if the hospitalist that took care of you is not available. Once you are discharged, your primary care physician will handle any further medical issues. Please note that NO REFILLS for any discharge medications will be authorized once you are discharged, as it is imperative that you return to your primary care physician (or establish a relationship with a primary care physician if you do not have one) for your aftercare needs so that they can reassess your need for medications and monitor your lab values.  ? Increase activity slowly   Complete by: As directed ?  ? ?  ? ?Allergies as of 11/09/2021   ? ?   Reactions  ? Penicillins   ? itch  ? Strawberry (diagnostic) Itching  ? Tomato Itching  ? ?  ? ?  ?Medication List  ?  ? ?STOP taking these medications   ? ?colchicine 0.6 MG tablet ?  ?furosemide 20 MG tablet ?Commonly known as: LASIX ?  ?ondansetron 4 MG disintegrating tablet ?Commonly known as: ZOFRAN-ODT ?  ?spironolactone 25 MG tablet ?Commonly known as: ALDACTONE ?  ? ?  ? ?TAKE these medications   ? ?acetaminophen 500 MG tablet ?Commonly  known as: TYLENOL ?Take 1,000 mg by mouth every 6 (six) hours as needed for mild pain. ?  ?albuterol 108 (90 Base) MCG/ACT inhaler ?Commonly known as: VENTOLIN HFA ?Inhale 2 puffs into the lungs every 6 (six) hours as needed for wheezing or shortness of breath. ?  ?amLODipine 5 MG tablet ?Commonly known as: NORVASC ?Take 1 tablet (5 mg total) by mouth daily. ?Start taking on: November 10, 2021 ?What changed:  ?medication strength ?how much to take ?  ?Bayer Microlet Lancets lancets ?Check blood sugar up to 3 times a day as instructed ?  ?busPIRone 5 MG tablet ?Commonly known as: BUSPAR ?Take 5 mg by mouth 2 (two) times daily. ?  ?carvedilol 12.5 MG tablet ?Commonly known as: COREG ?Take 1 tablet (12.5 mg total) by mouth 2 (two) times daily with a meal. ?What changed: how much to take ?  ?DULoxetine 30 MG capsule ?Commonly known as: CYMBALTA ?Take 30 mg by mouth daily. ?What changed: Another  medication with the same name was removed. Continue taking this medication, and follow the directions you see here. ?  ?ezetimibe 10 MG tablet ?Commonly known as: ZETIA ?Take 10 mg by mouth daily. ?  ?gabapentin 300 MG capsule ?Commonly known as: Neurontin ?Take 3 capsules (900 mg total) by mouth 3 (three) times daily. ?What changed:  ?how much to take ?when to take this ?  ?glucose blood test strip ?Check blood sugar up to 3 times a day as instructed ?  ?metroNIDAZOLE 500 MG tablet ?Commonly known as: FLAGYL ?Take 1 tablet (500 mg total) by mouth every 12 (twelve) hours for 4 days. ?  ?Multi-Vitamin tablet ?Take 1 tablet by mouth daily. ?  ?nitroGLYCERIN 0.4 MG SL tablet ?Commonly known as: NITROSTAT ?Place 0.4 mg under the tongue every 5 (five) minutes as needed for chest pain. ?  ?NovoLIN 70/30 Kwikpen (70-30) 100 UNIT/ML KwikPen ?Generic drug: insulin isophane & regular human KwikPen ?Inject 4 Units into the skin 2 (two) times daily before lunch and supper. ?What changed: how much to take ?  ?oxyCODONE-acetaminophen 5-325 MG  tablet ?Commonly known as: PERCOCET/ROXICET ?Take 1 tablet by mouth every 6 (six) hours as needed for severe pain. ?  ?polyethylene glycol 17 g packet ?Commonly known as: MIRALAX / GLYCOLAX ?Take 17 g by mouth daily. ?

## 2021-11-12 ENCOUNTER — Ambulatory Visit: Payer: Managed Care, Other (non HMO) | Admitting: Internal Medicine

## 2021-11-19 ENCOUNTER — Telehealth: Payer: Self-pay | Admitting: *Deleted

## 2021-11-19 NOTE — Telephone Encounter (Signed)
Call from patient states is having Chest pain.  Has had in past and went to the ER.  States Chest Pain to point where it hurts to breath sometimes.  Patient was advised to get to the ER as soon as possible.  Patient stated that she is sure that it is not her heart.   Patient was again advised to go to the ER for assessment .  Agreed to go to the ER.  Has a follow up appointment in the Clinics on Wednesday. ?

## 2021-11-21 ENCOUNTER — Inpatient Hospital Stay (HOSPITAL_COMMUNITY)
Admission: EM | Admit: 2021-11-21 | Discharge: 2021-11-23 | DRG: 065 | Disposition: A | Discharge status: Home / Self Care | Payer: 59 | Attending: Internal Medicine | Admitting: Internal Medicine

## 2021-11-21 ENCOUNTER — Other Ambulatory Visit: Payer: Self-pay

## 2021-11-21 ENCOUNTER — Encounter (HOSPITAL_COMMUNITY): Payer: Self-pay

## 2021-11-21 ENCOUNTER — Observation Stay (HOSPITAL_COMMUNITY): Payer: 59

## 2021-11-21 ENCOUNTER — Ambulatory Visit (INDEPENDENT_AMBULATORY_CARE_PROVIDER_SITE_OTHER): Payer: 59 | Admitting: Student

## 2021-11-21 ENCOUNTER — Emergency Department (HOSPITAL_COMMUNITY): Payer: 59

## 2021-11-21 DIAGNOSIS — Z90721 Acquired absence of ovaries, unilateral: Secondary | ICD-10-CM

## 2021-11-21 DIAGNOSIS — N3289 Other specified disorders of bladder: Secondary | ICD-10-CM | POA: Diagnosis present

## 2021-11-21 DIAGNOSIS — Y92000 Kitchen of unspecified non-institutional (private) residence as  the place of occurrence of the external cause: Secondary | ICD-10-CM

## 2021-11-21 DIAGNOSIS — I10 Essential (primary) hypertension: Secondary | ICD-10-CM | POA: Diagnosis present

## 2021-11-21 DIAGNOSIS — Z91018 Allergy to other foods: Secondary | ICD-10-CM

## 2021-11-21 DIAGNOSIS — I130 Hypertensive heart and chronic kidney disease with heart failure and stage 1 through stage 4 chronic kidney disease, or unspecified chronic kidney disease: Secondary | ICD-10-CM | POA: Diagnosis present

## 2021-11-21 DIAGNOSIS — R197 Diarrhea, unspecified: Secondary | ICD-10-CM | POA: Diagnosis not present

## 2021-11-21 DIAGNOSIS — M79602 Pain in left arm: Secondary | ICD-10-CM | POA: Diagnosis present

## 2021-11-21 DIAGNOSIS — Z794 Long term (current) use of insulin: Secondary | ICD-10-CM

## 2021-11-21 DIAGNOSIS — F329 Major depressive disorder, single episode, unspecified: Secondary | ICD-10-CM | POA: Diagnosis present

## 2021-11-21 DIAGNOSIS — R809 Proteinuria, unspecified: Secondary | ICD-10-CM | POA: Diagnosis present

## 2021-11-21 DIAGNOSIS — G4700 Insomnia, unspecified: Secondary | ICD-10-CM | POA: Diagnosis present

## 2021-11-21 DIAGNOSIS — N3941 Urge incontinence: Secondary | ICD-10-CM | POA: Diagnosis present

## 2021-11-21 DIAGNOSIS — N1832 Chronic kidney disease, stage 3b: Secondary | ICD-10-CM | POA: Diagnosis present

## 2021-11-21 DIAGNOSIS — R278 Other lack of coordination: Secondary | ICD-10-CM | POA: Diagnosis present

## 2021-11-21 DIAGNOSIS — R296 Repeated falls: Secondary | ICD-10-CM | POA: Diagnosis present

## 2021-11-21 DIAGNOSIS — R1013 Epigastric pain: Secondary | ICD-10-CM | POA: Diagnosis present

## 2021-11-21 DIAGNOSIS — I6389 Other cerebral infarction: Secondary | ICD-10-CM | POA: Diagnosis not present

## 2021-11-21 DIAGNOSIS — K921 Melena: Secondary | ICD-10-CM | POA: Diagnosis present

## 2021-11-21 DIAGNOSIS — E1165 Type 2 diabetes mellitus with hyperglycemia: Secondary | ICD-10-CM | POA: Diagnosis present

## 2021-11-21 DIAGNOSIS — E440 Moderate protein-calorie malnutrition: Secondary | ICD-10-CM | POA: Diagnosis present

## 2021-11-21 DIAGNOSIS — E1142 Type 2 diabetes mellitus with diabetic polyneuropathy: Secondary | ICD-10-CM

## 2021-11-21 DIAGNOSIS — R1011 Right upper quadrant pain: Secondary | ICD-10-CM | POA: Diagnosis present

## 2021-11-21 DIAGNOSIS — Z833 Family history of diabetes mellitus: Secondary | ICD-10-CM

## 2021-11-21 DIAGNOSIS — W19XXXA Unspecified fall, initial encounter: Secondary | ICD-10-CM | POA: Diagnosis present

## 2021-11-21 DIAGNOSIS — D500 Iron deficiency anemia secondary to blood loss (chronic): Secondary | ICD-10-CM | POA: Diagnosis present

## 2021-11-21 DIAGNOSIS — I5032 Chronic diastolic (congestive) heart failure: Secondary | ICD-10-CM | POA: Diagnosis present

## 2021-11-21 DIAGNOSIS — E876 Hypokalemia: Secondary | ICD-10-CM | POA: Diagnosis present

## 2021-11-21 DIAGNOSIS — I259 Chronic ischemic heart disease, unspecified: Principal | ICD-10-CM

## 2021-11-21 DIAGNOSIS — R079 Chest pain, unspecified: Secondary | ICD-10-CM

## 2021-11-21 DIAGNOSIS — F411 Generalized anxiety disorder: Secondary | ICD-10-CM | POA: Diagnosis present

## 2021-11-21 DIAGNOSIS — M797 Fibromyalgia: Secondary | ICD-10-CM | POA: Diagnosis present

## 2021-11-21 DIAGNOSIS — R200 Anesthesia of skin: Secondary | ICD-10-CM | POA: Diagnosis present

## 2021-11-21 DIAGNOSIS — Z8249 Family history of ischemic heart disease and other diseases of the circulatory system: Secondary | ICD-10-CM

## 2021-11-21 DIAGNOSIS — R81 Glycosuria: Secondary | ICD-10-CM | POA: Diagnosis present

## 2021-11-21 DIAGNOSIS — R739 Hyperglycemia, unspecified: Secondary | ICD-10-CM | POA: Diagnosis not present

## 2021-11-21 DIAGNOSIS — R159 Full incontinence of feces: Secondary | ICD-10-CM | POA: Diagnosis present

## 2021-11-21 DIAGNOSIS — G8324 Monoplegia of upper limb affecting left nondominant side: Secondary | ICD-10-CM | POA: Diagnosis present

## 2021-11-21 DIAGNOSIS — R918 Other nonspecific abnormal finding of lung field: Secondary | ICD-10-CM | POA: Diagnosis present

## 2021-11-21 DIAGNOSIS — D72829 Elevated white blood cell count, unspecified: Secondary | ICD-10-CM | POA: Diagnosis present

## 2021-11-21 DIAGNOSIS — M7989 Other specified soft tissue disorders: Secondary | ICD-10-CM | POA: Diagnosis present

## 2021-11-21 DIAGNOSIS — Z880 Allergy status to penicillin: Secondary | ICD-10-CM

## 2021-11-21 DIAGNOSIS — E1122 Type 2 diabetes mellitus with diabetic chronic kidney disease: Secondary | ICD-10-CM | POA: Diagnosis present

## 2021-11-21 DIAGNOSIS — Z681 Body mass index (BMI) 19 or less, adult: Secondary | ICD-10-CM

## 2021-11-21 DIAGNOSIS — E7800 Pure hypercholesterolemia, unspecified: Secondary | ICD-10-CM | POA: Diagnosis present

## 2021-11-21 DIAGNOSIS — I248 Other forms of acute ischemic heart disease: Secondary | ICD-10-CM | POA: Diagnosis present

## 2021-11-21 DIAGNOSIS — I509 Heart failure, unspecified: Secondary | ICD-10-CM

## 2021-11-21 DIAGNOSIS — F17210 Nicotine dependence, cigarettes, uncomplicated: Secondary | ICD-10-CM | POA: Diagnosis present

## 2021-11-21 DIAGNOSIS — D649 Anemia, unspecified: Secondary | ICD-10-CM

## 2021-11-21 DIAGNOSIS — Z79899 Other long term (current) drug therapy: Secondary | ICD-10-CM

## 2021-11-21 DIAGNOSIS — D509 Iron deficiency anemia, unspecified: Secondary | ICD-10-CM | POA: Diagnosis present

## 2021-11-21 LAB — COMPREHENSIVE METABOLIC PANEL
ALT: 16 U/L (ref 0–44)
AST: 18 U/L (ref 15–41)
Albumin: 1.9 g/dL — ABNORMAL LOW (ref 3.5–5.0)
Alkaline Phosphatase: 141 U/L — ABNORMAL HIGH (ref 38–126)
Anion gap: 7 (ref 5–15)
BUN: 25 mg/dL — ABNORMAL HIGH (ref 6–20)
CO2: 24 mmol/L (ref 22–32)
Calcium: 7.7 mg/dL — ABNORMAL LOW (ref 8.9–10.3)
Chloride: 100 mmol/L (ref 98–111)
Creatinine, Ser: 1.84 mg/dL — ABNORMAL HIGH (ref 0.44–1.00)
GFR, Estimated: 31 mL/min — ABNORMAL LOW (ref >60)
Glucose, Bld: 596 mg/dL (ref 70–99)
Potassium: 3.3 mmol/L — ABNORMAL LOW (ref 3.5–5.1)
Sodium: 131 mmol/L — ABNORMAL LOW (ref 135–145)
Total Bilirubin: 0.4 mg/dL (ref 0.3–1.2)
Total Protein: 5.4 g/dL — ABNORMAL LOW (ref 6.5–8.1)

## 2021-11-21 LAB — CBC WITH DIFFERENTIAL/PLATELET
Abs Immature Granulocytes: 0.04 10*3/uL (ref 0.00–0.07)
Basophils Absolute: 0 10*3/uL (ref 0.0–0.1)
Basophils Relative: 0 %
Eosinophils Absolute: 0 10*3/uL (ref 0.0–0.5)
Eosinophils Relative: 0 %
HCT: 24.7 % — ABNORMAL LOW (ref 36.0–46.0)
Hemoglobin: 8.2 g/dL — ABNORMAL LOW (ref 12.0–15.0)
Immature Granulocytes: 0 %
Lymphocytes Relative: 16 %
Lymphs Abs: 1.8 10*3/uL (ref 0.7–4.0)
MCH: 29.1 pg (ref 26.0–34.0)
MCHC: 33.2 g/dL (ref 30.0–36.0)
MCV: 87.6 fL (ref 80.0–100.0)
Monocytes Absolute: 0.7 10*3/uL (ref 0.1–1.0)
Monocytes Relative: 6 %
Neutro Abs: 9.1 10*3/uL — ABNORMAL HIGH (ref 1.7–7.7)
Neutrophils Relative %: 78 %
Platelets: 415 10*3/uL — ABNORMAL HIGH (ref 150–400)
RBC: 2.82 MIL/uL — ABNORMAL LOW (ref 3.87–5.11)
RDW: 13.9 % (ref 11.5–15.5)
WBC: 11.8 10*3/uL — ABNORMAL HIGH (ref 4.0–10.5)
nRBC: 0 % (ref 0.0–0.2)

## 2021-11-21 LAB — URINALYSIS, ROUTINE W REFLEX MICROSCOPIC
Bilirubin Urine: NEGATIVE
Glucose, UA: >=500 mg/dL — AB
Ketones, ur: NEGATIVE mg/dL
Nitrite: NEGATIVE
Protein, ur: >=300 mg/dL — AB
Specific Gravity, Urine: 1.013 (ref 1.005–1.030)
WBC, UA: >50 WBC/hpf — ABNORMAL HIGH (ref 0–5)
pH: 5 (ref 5.0–8.0)

## 2021-11-21 LAB — LIPASE, BLOOD: Lipase: 65 U/L — ABNORMAL HIGH (ref 11–51)

## 2021-11-21 LAB — CBG MONITORING, ED
Glucose-Capillary: 227 mg/dL — ABNORMAL HIGH (ref 70–99)
Glucose-Capillary: 172 mg/dL — ABNORMAL HIGH (ref 70–99)

## 2021-11-21 LAB — TROPONIN I (HIGH SENSITIVITY)
Troponin I (High Sensitivity): 34 ng/L — ABNORMAL HIGH (ref <18)
Troponin I (High Sensitivity): 35 ng/L — ABNORMAL HIGH (ref <18)

## 2021-11-21 LAB — MAGNESIUM: Magnesium: 2 mg/dL (ref 1.7–2.4)

## 2021-11-21 LAB — BRAIN NATRIURETIC PEPTIDE: B Natriuretic Peptide: 335.2 pg/mL — ABNORMAL HIGH (ref 0.0–100.0)

## 2021-11-21 IMAGING — CT CT HEAD W/O CM
3 series · 16 of 47 positions shown, 19 images · non-contrast
Comparison: None.

CLINICAL DATA: Hypertension



[Series 3: head 5.0 h30s · axial · 0.40mm/px · z∈[+54,+184]mm · 10 of 32 slices shown, 13 images]
[im 3/32  brain]
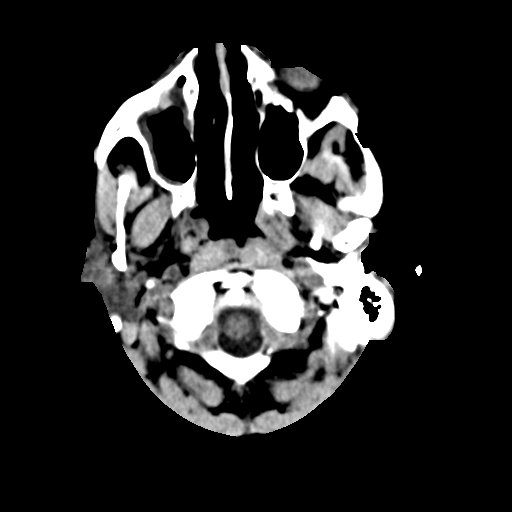
[im 3/32  bone]
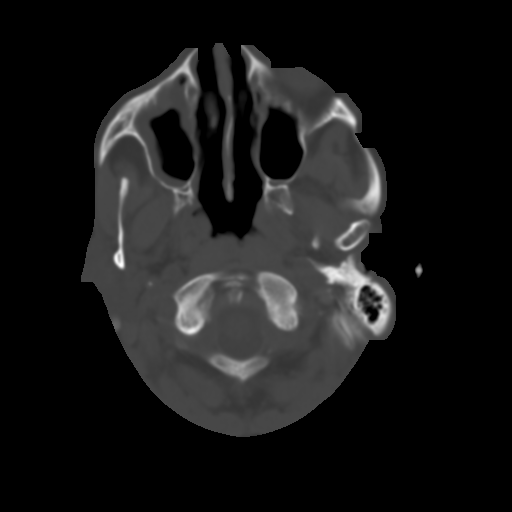
[im 6/32  brain]
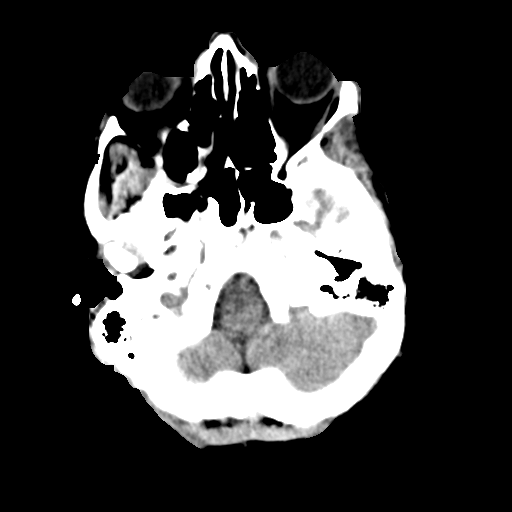
[im 9/32  brain]
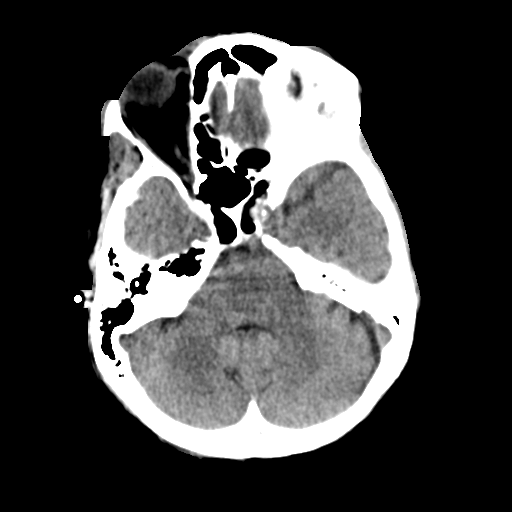
[im 11/32  brain]
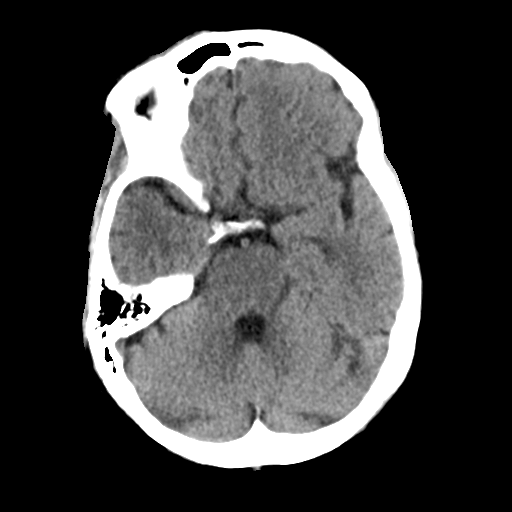
[im 14/32  brain]
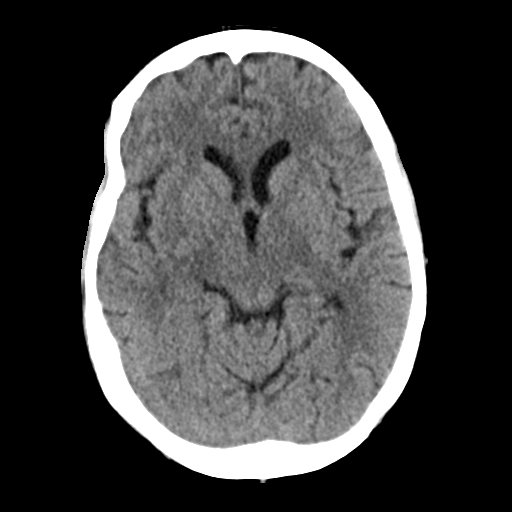
[im 14/32  bone]
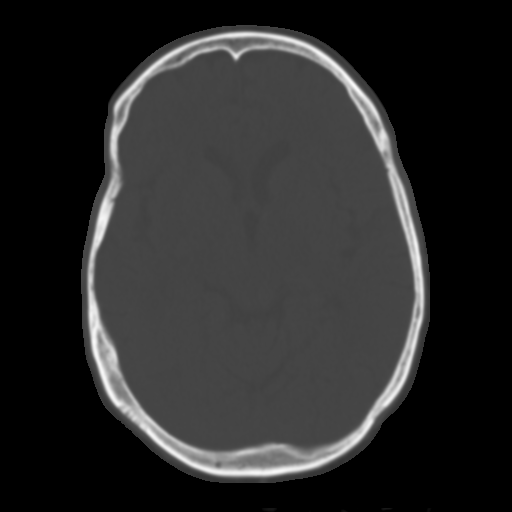
[im 18/32  brain]
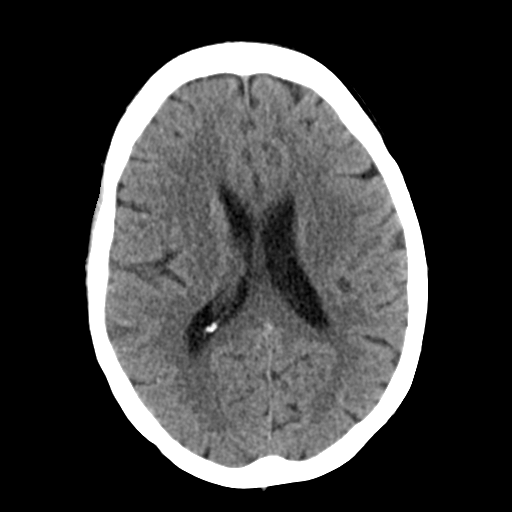
[im 21/32  brain]
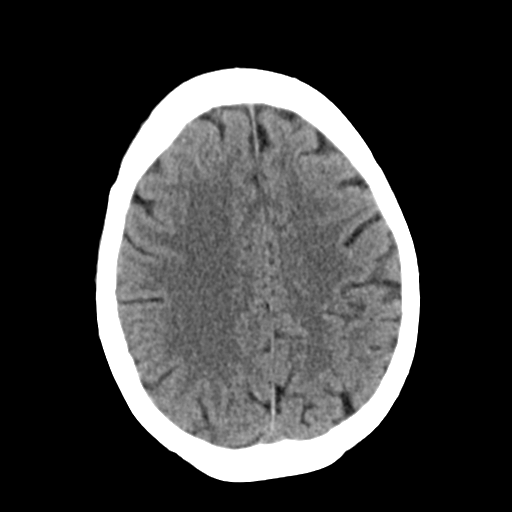
[im 24/32  brain]
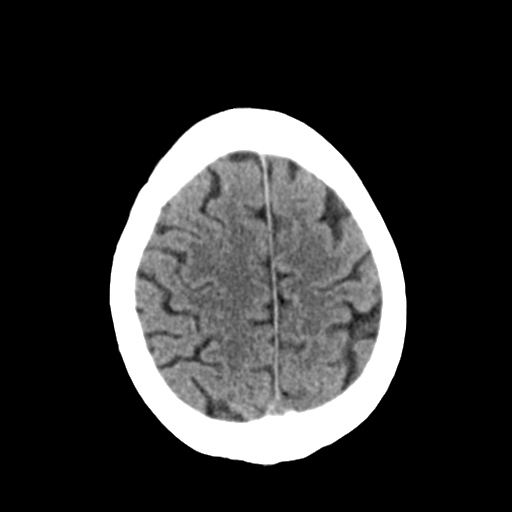
[im 26/32  brain]
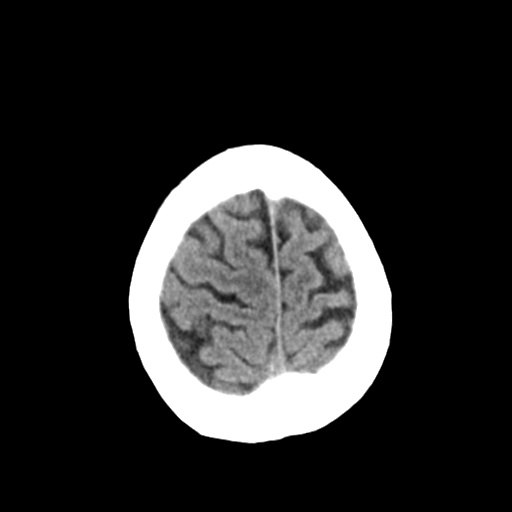
[im 26/32  bone]
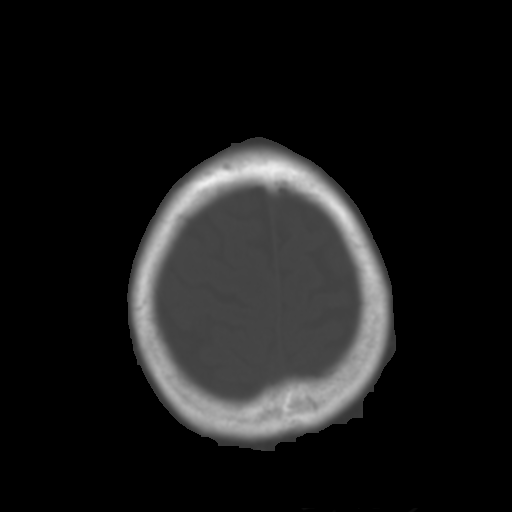
[im 29/32  brain]
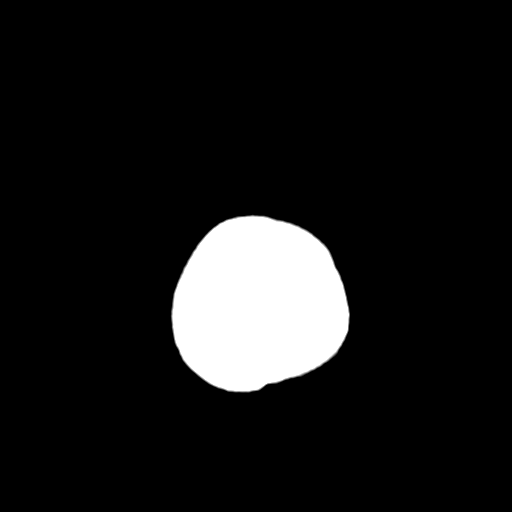

[Series 5: head 3.0 mpr cor · coronal · 0.31mm/px · 3 of 67 slices shown]
[im 23/67  brain]
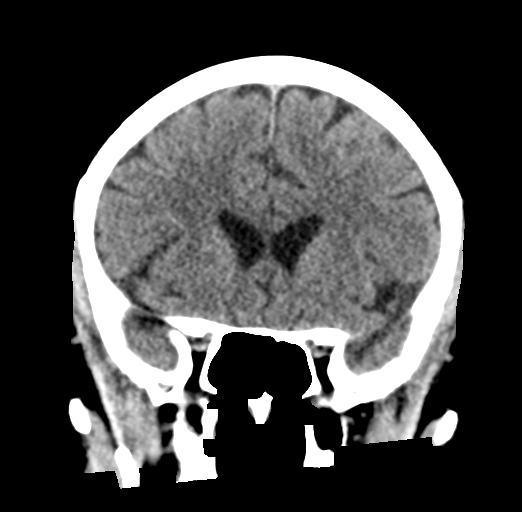
[im 30/67  brain]
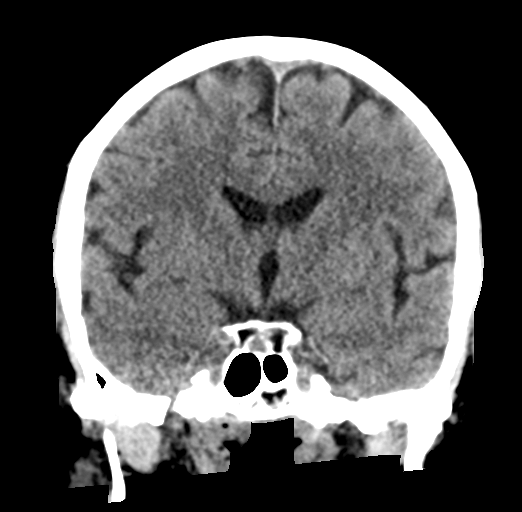
[im 37/67  brain]
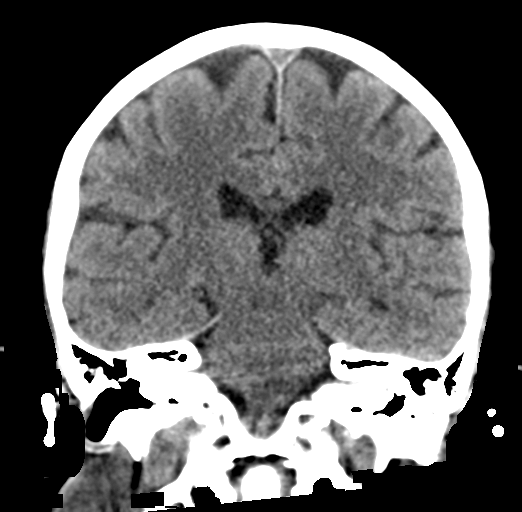

[Series 6: head 3.0 mpr sag · sagittal · 0.31mm/px · 3 of 55 slices shown]
[im 20/55  brain]
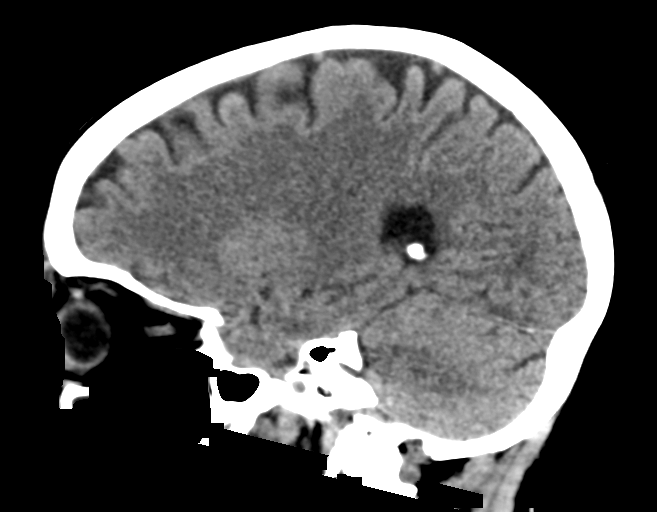
[im 28/55  brain]
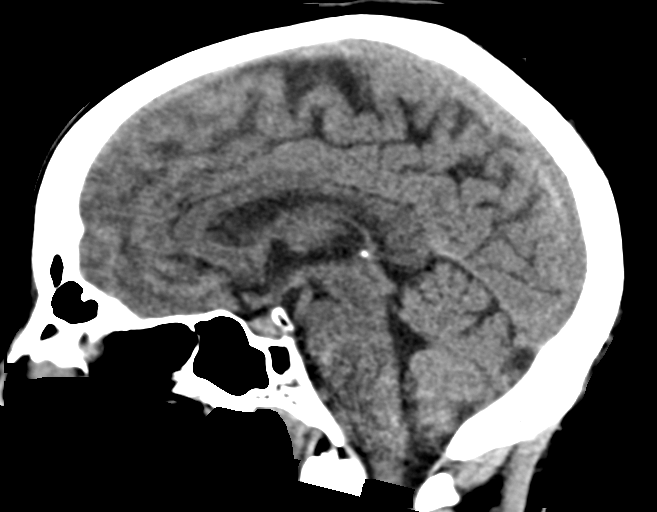
[im 35/55  brain]
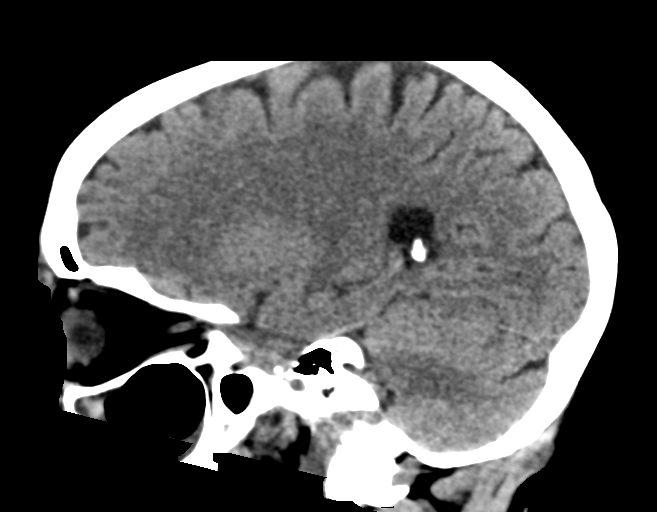

[16 of 47 positions shown; findings below may reference images not displayed]

FINDINGS: Brain: No evidence of acute infarction, hemorrhage, hydrocephalus,
extra-axial collection or mass lesion/mass effect.

Vascular: No hyperdense vessel or unexpected calcification.

Skull: Normal. Negative for fracture or focal lesion.

Sinuses/Orbits: Partial opacification of the right ethmoid and
maxillary sinus. Mild mucosal thickening of the left maxillary and
right sphenoid sinuses. Mastoid air cells are clear.

Other: None.
IMPRESSION: Normal head CT.

## 2021-11-21 IMAGING — US US ABDOMEN LIMITED
1 series · 14 of 25 positions shown · non-contrast
Comparison: Ultrasound [DATE], CT [DATE]

CLINICAL DATA: Right upper quadrant pain

EXAM:
ULTRASOUND ABDOMEN LIMITED RIGHT UPPER QUADRANT

[Series 1: us abdomen limited ruq (liver/gb) · 14 of 64 slices shown]
[im 1/64]
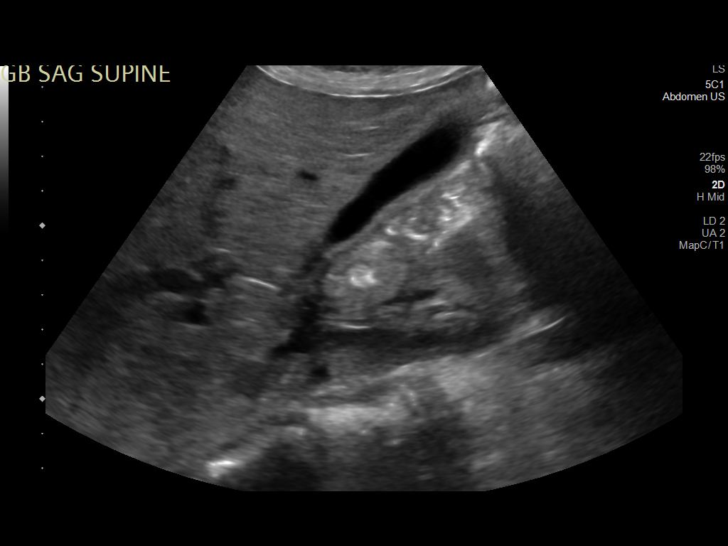
[im 6/64]
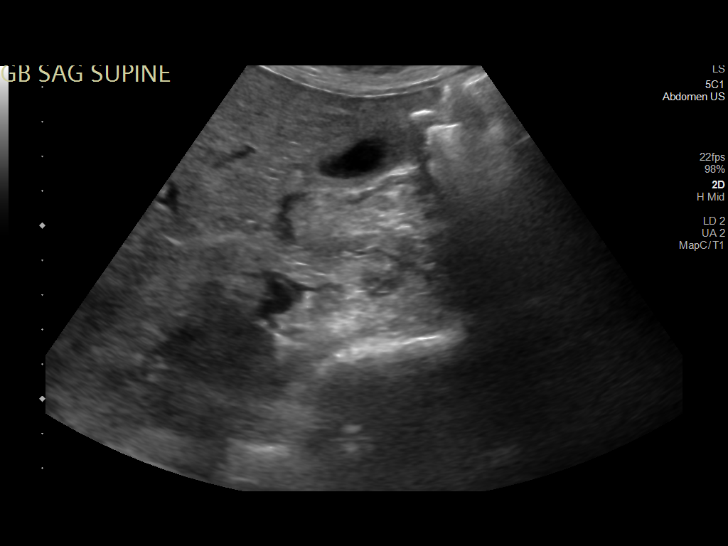
[im 11/64]
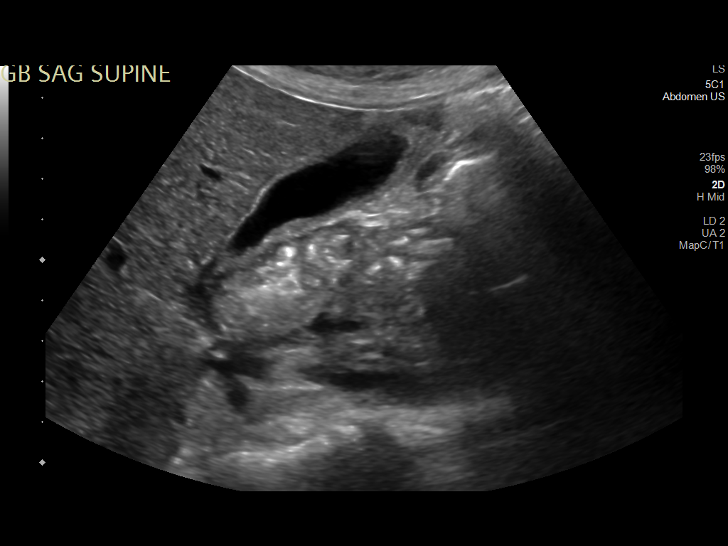
[im 16/64]
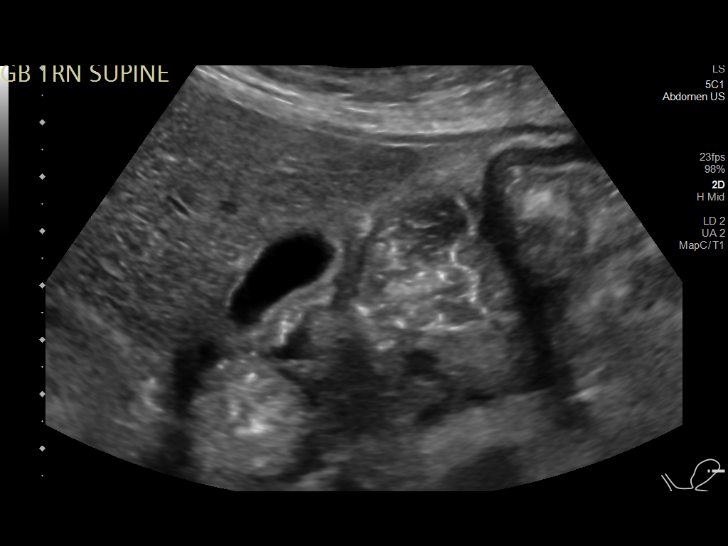
[im 22/64]
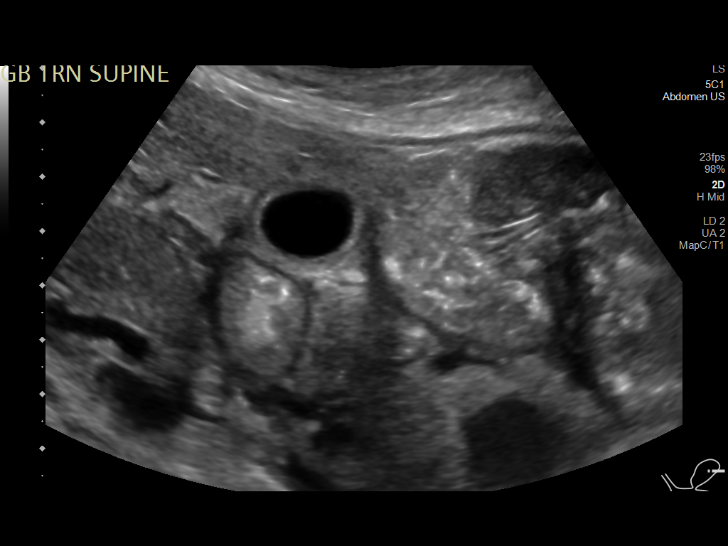
[im 24/64]
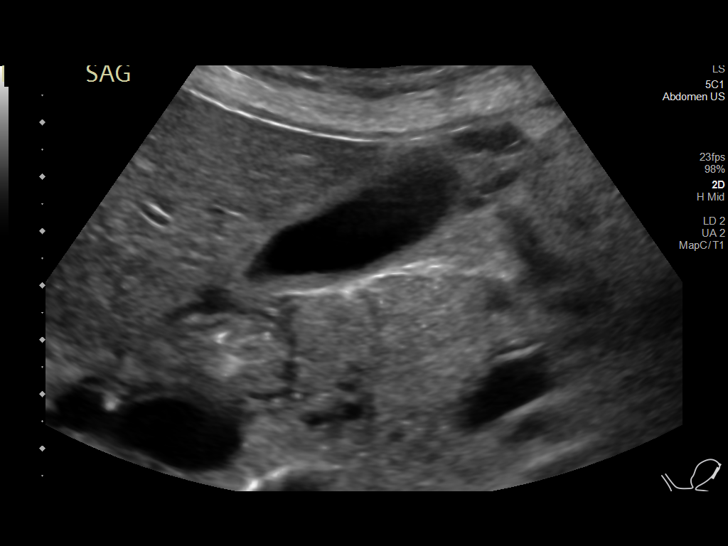
[im 29/64]
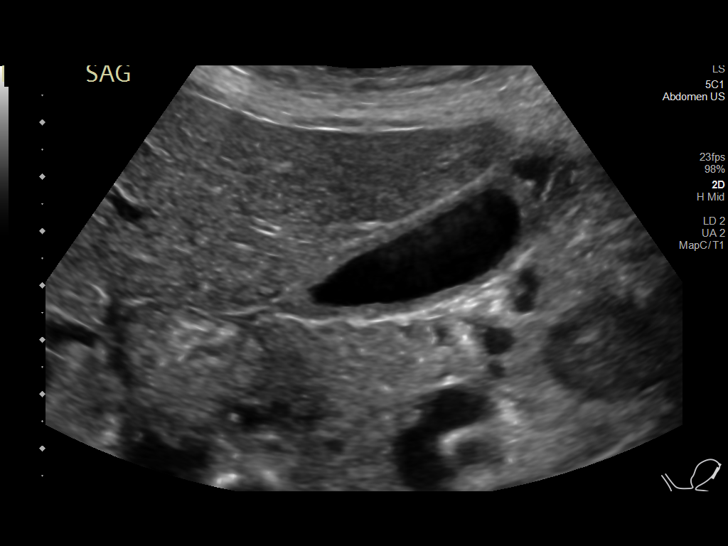
[im 35/64]
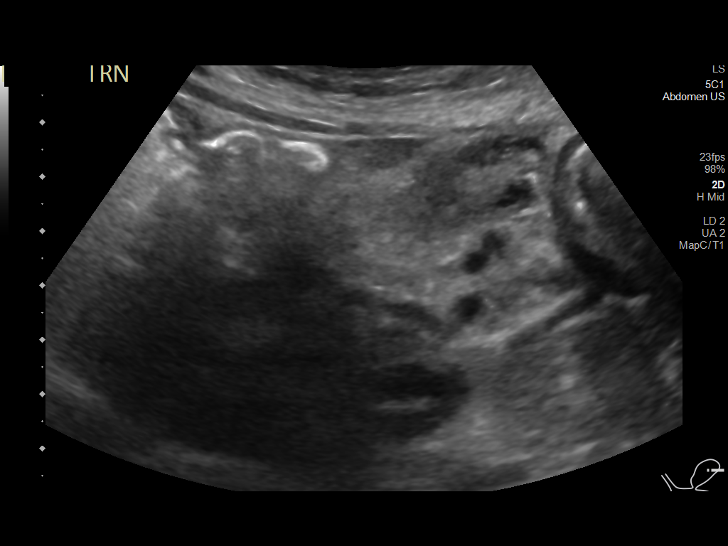
[im 40/64]
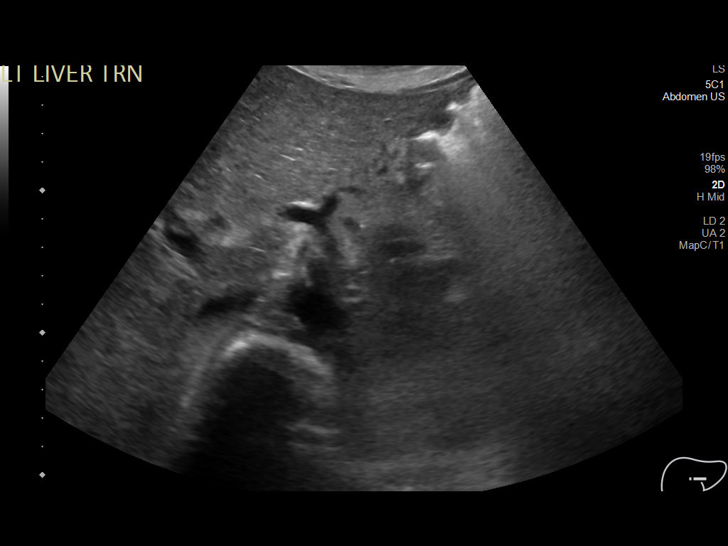
[im 43/64]
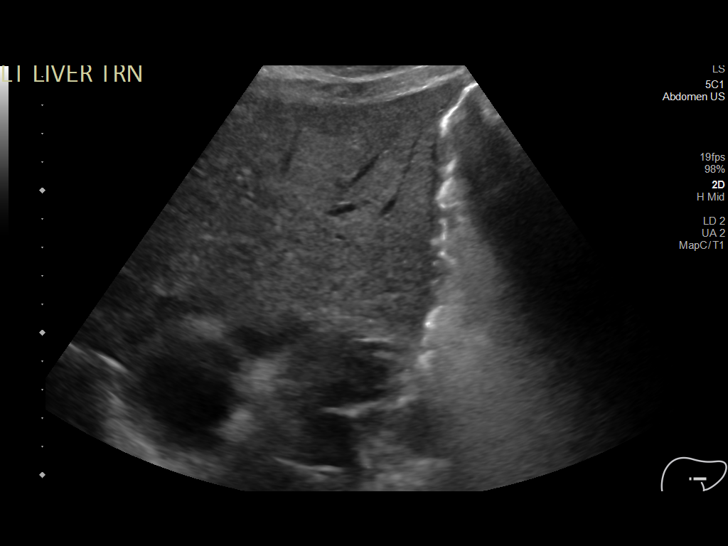
[im 48/64]
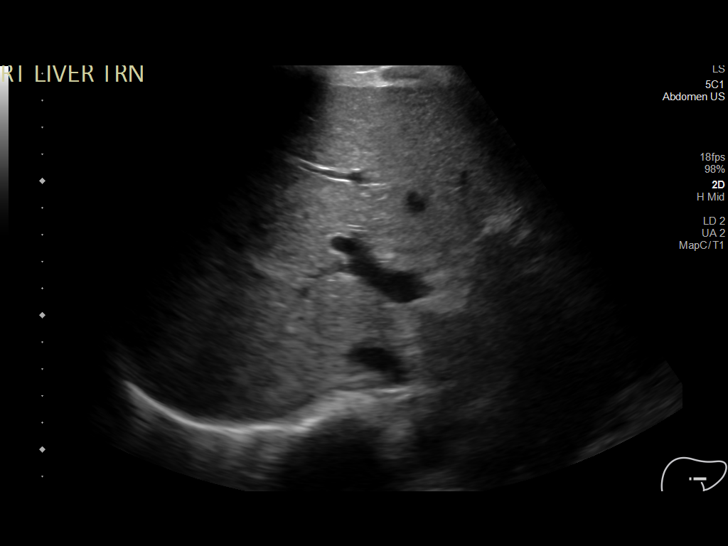
[im 53/64]
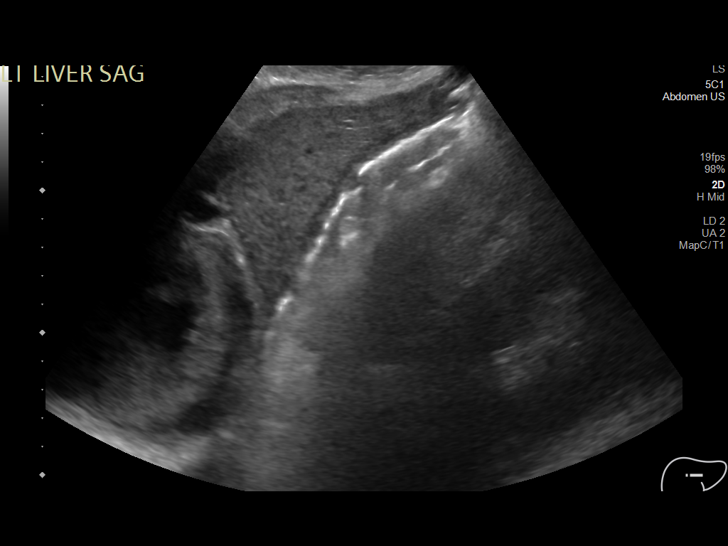
[im 58/64]
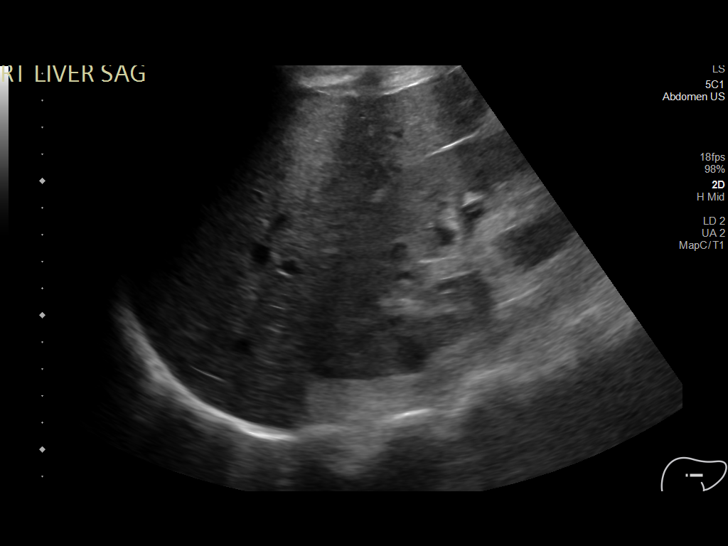
[im 64/64]
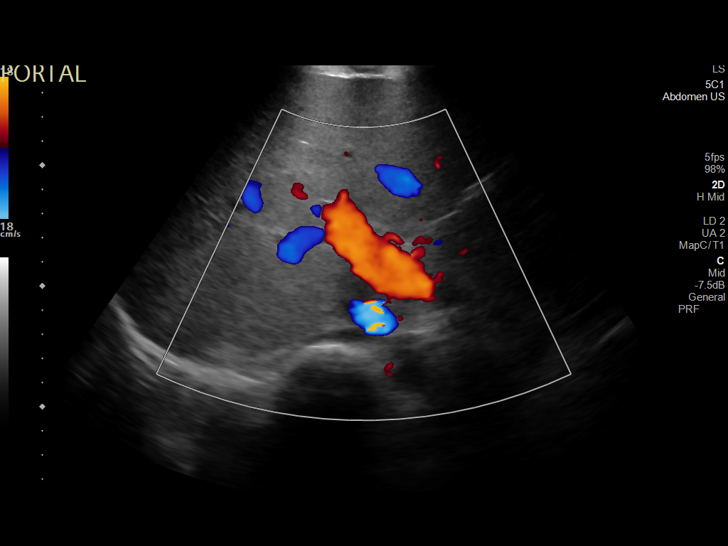

[14 of 25 positions shown; findings below may reference images not displayed]

FINDINGS: Gallbladder:

No gallstones or wall thickening visualized. No sonographic Murphy
sign noted by sonographer.

Common bile duct:

Diameter:

Liver:

Within normal limits for echogenicity. No focal hepatic abnormality.
Portal vein is patent on color Doppler imaging with normal direction
of blood flow towards the liver.

Other: None.
IMPRESSION: Negative right upper quadrant abdominal ultrasound

## 2021-11-21 IMAGING — CR DG CHEST 2V
2 series · 2 of 2 positions shown · non-contrast
Comparison: Chest radiograph [DATE], [DATE] weeks ago. Chest CT
[DATE], earlier this month.

CLINICAL DATA: Chest pain for 1 day.

EXAM:
CHEST - 2 VIEW

[chest lat]
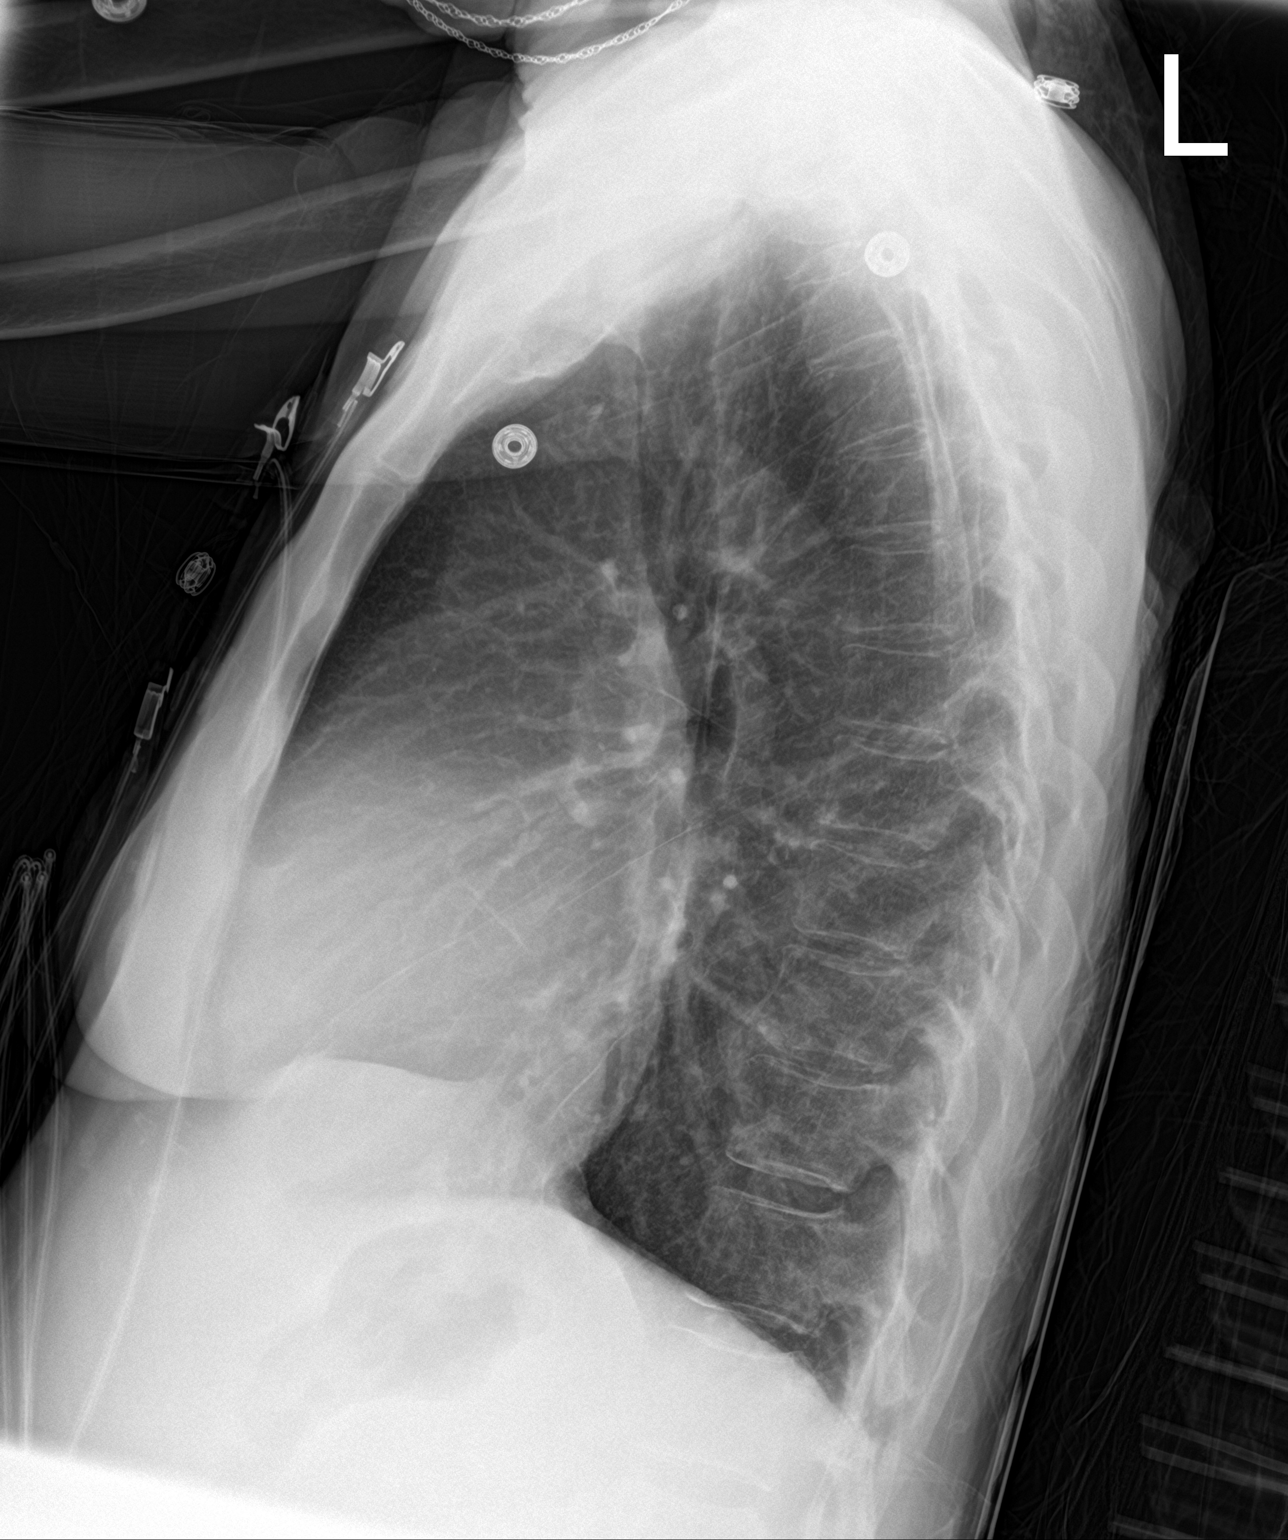

[chest ap]
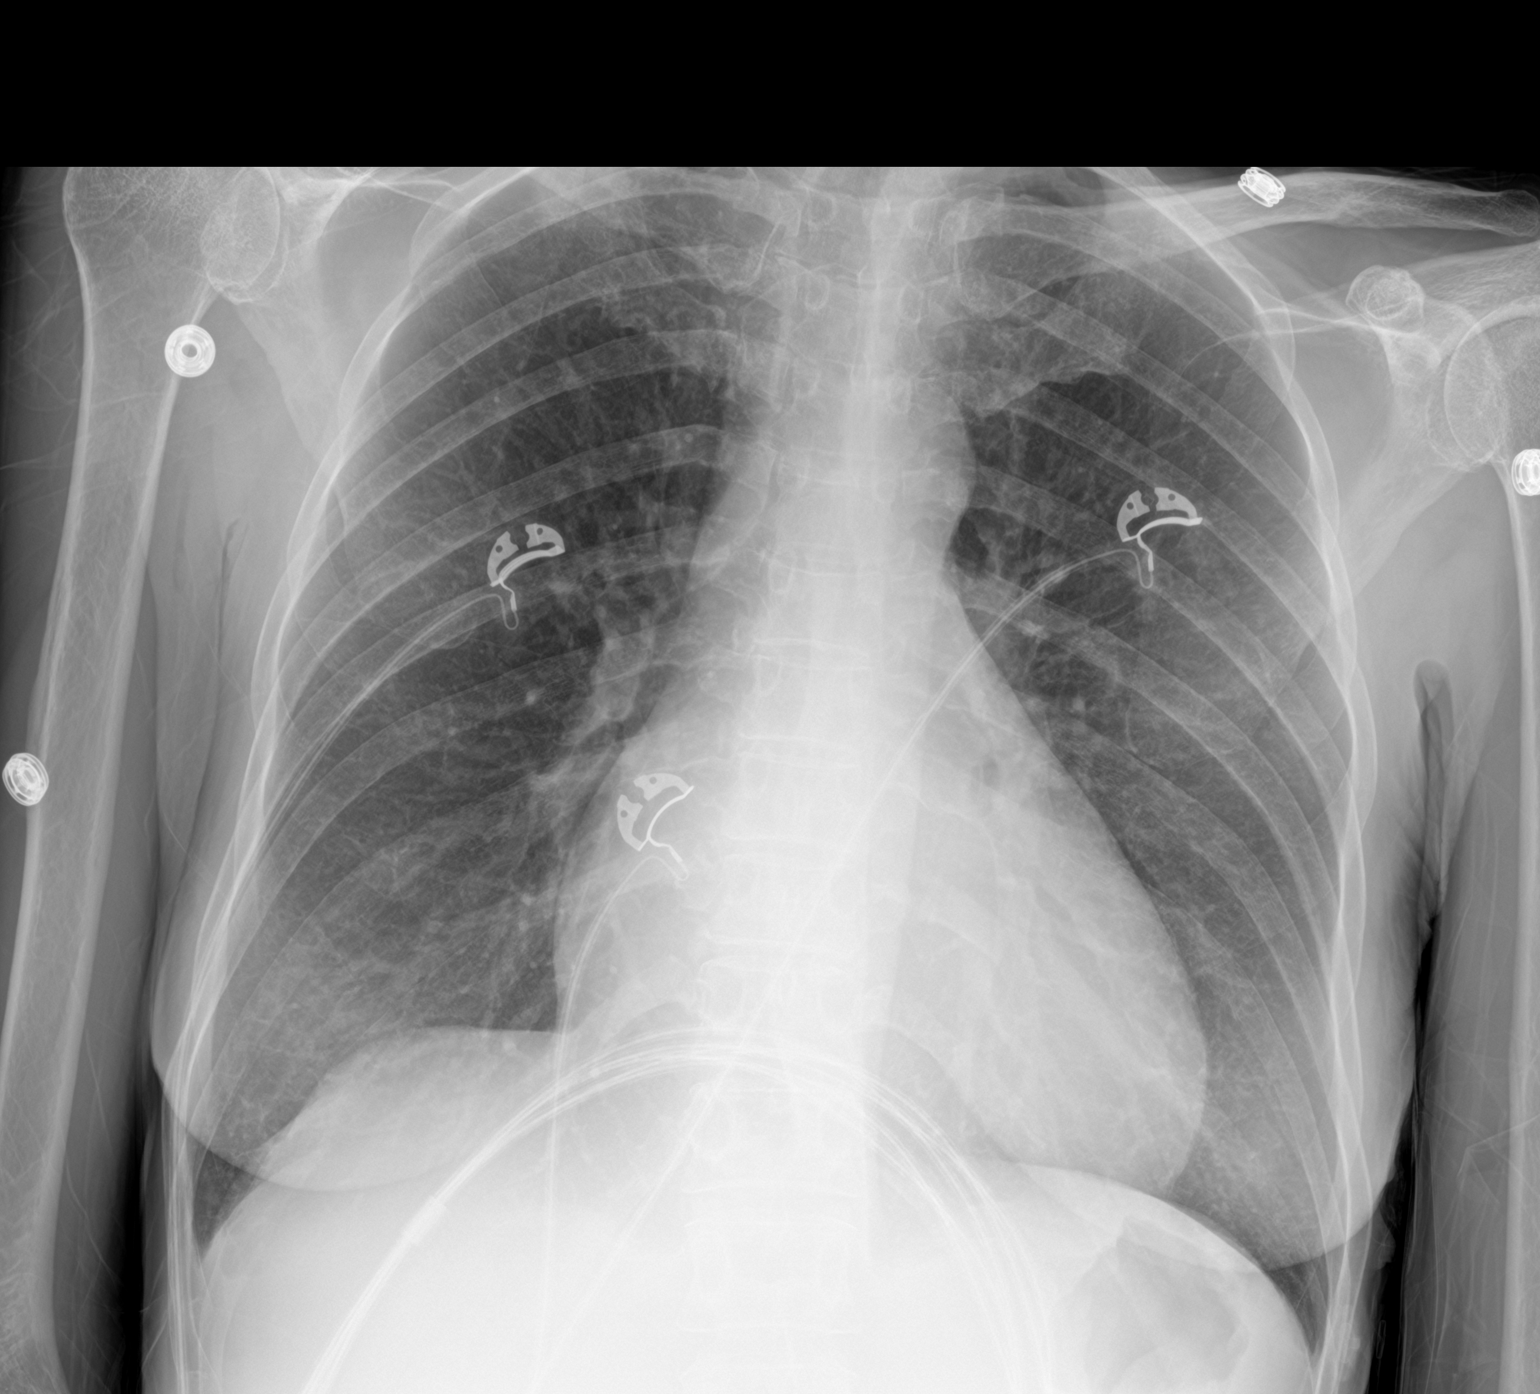

[2 of 2 positions shown; findings below may reference images not displayed]

FINDINGS: Stable cardiomegaly.The cardiomediastinal contours are normal. Small
pulmonary nodules on prior CT are not seen by radiograph. Pulmonary
vasculature is normal. No consolidation, pleural effusion, or
pneumothorax. No acute osseous abnormalities are seen.
IMPRESSION: Stable radiographic appearance of the chest with cardiomegaly. No
acute abnormality.

## 2021-11-21 MED ORDER — MIRTAZAPINE 7.5 MG PO TABS
15.0000 mg | ORAL_TABLET | Freq: Every day | ORAL | Status: DC
  Administered 2021-11-22 (×2): 15 mg via ORAL
  Filled 2021-11-21: qty 2
  Filled 2021-11-21: qty 1

## 2021-11-21 MED ORDER — ACETAMINOPHEN 325 MG PO TABS
650.0000 mg | ORAL_TABLET | Freq: Four times a day (QID) | ORAL | Status: DC | PRN
  Administered 2021-11-23: 650 mg via ORAL
  Filled 2021-11-21 (×2): qty 2

## 2021-11-21 MED ORDER — POTASSIUM CHLORIDE CRYS ER 20 MEQ PO TBCR
40.0000 meq | EXTENDED_RELEASE_TABLET | Freq: Once | ORAL | Status: AC
  Administered 2021-11-22: 40 meq via ORAL
  Filled 2021-11-21: qty 2

## 2021-11-21 MED ORDER — SENNOSIDES-DOCUSATE SODIUM 8.6-50 MG PO TABS: 1.0000 | ORAL_TABLET | Freq: Every evening | ORAL | Status: DC | PRN

## 2021-11-21 MED ORDER — DULOXETINE HCL 30 MG PO CPEP
30.0000 mg | ORAL_CAPSULE | Freq: Every day | ORAL | Status: DC
  Administered 2021-11-22 – 2021-11-23 (×3): 30 mg via ORAL
  Filled 2021-11-21 (×4): qty 1

## 2021-11-21 MED ORDER — INSULIN ASPART 100 UNIT/ML IJ SOLN
10.0000 [IU] | Freq: Once | INTRAMUSCULAR | Status: AC
  Administered 2021-11-21: 10 [IU] via INTRAVENOUS

## 2021-11-21 MED ORDER — ONDANSETRON HCL 4 MG PO TABS: 4.0000 mg | ORAL_TABLET | Freq: Four times a day (QID) | ORAL | Status: DC | PRN

## 2021-11-21 MED ORDER — AMLODIPINE BESYLATE 5 MG PO TABS
5.0000 mg | ORAL_TABLET | Freq: Every day | ORAL | Status: DC
Start: 2021-11-21 — End: 2021-11-23
  Administered 2021-11-22 – 2021-11-23 (×3): 5 mg via ORAL
  Filled 2021-11-21 (×3): qty 1

## 2021-11-21 MED ORDER — NITROGLYCERIN 0.4 MG SL SUBL
0.4000 mg | SUBLINGUAL_TABLET | Freq: Once | SUBLINGUAL | Status: AC
  Administered 2021-11-21: 0.4 mg via SUBLINGUAL
  Filled 2021-11-21: qty 1

## 2021-11-21 MED ORDER — ACETAMINOPHEN 650 MG RE SUPP: 650.0000 mg | Freq: Four times a day (QID) | RECTAL | Status: DC | PRN

## 2021-11-21 MED ORDER — LOSARTAN POTASSIUM 50 MG PO TABS
100.0000 mg | ORAL_TABLET | Freq: Every day | ORAL | Status: DC
  Administered 2021-11-22 – 2021-11-23 (×3): 100 mg via ORAL
  Filled 2021-11-21 (×3): qty 2

## 2021-11-21 MED ORDER — INSULIN GLARGINE-YFGN 100 UNIT/ML ~~LOC~~ SOLN
10.0000 [IU] | Freq: Every day | SUBCUTANEOUS | Status: DC
  Administered 2021-11-22: 10 [IU] via SUBCUTANEOUS
  Filled 2021-11-21 (×3): qty 0.1

## 2021-11-21 MED ORDER — BUSPIRONE HCL 10 MG PO TABS
5.0000 mg | ORAL_TABLET | Freq: Two times a day (BID) | ORAL | Status: DC
  Administered 2021-11-22 – 2021-11-23 (×4): 5 mg via ORAL
  Filled 2021-11-21 (×4): qty 1

## 2021-11-21 MED ORDER — INSULIN ASPART 100 UNIT/ML IJ SOLN
0.0000 [IU] | Freq: Three times a day (TID) | INTRAMUSCULAR | Status: DC
  Administered 2021-11-22: 8 [IU] via SUBCUTANEOUS
  Administered 2021-11-22 (×2): 3 [IU] via SUBCUTANEOUS

## 2021-11-21 MED ORDER — ONDANSETRON HCL 4 MG/2ML IJ SOLN
4.0000 mg | Freq: Four times a day (QID) | INTRAMUSCULAR | Status: DC | PRN
Start: 2021-11-21 — End: 2021-11-23
  Administered 2021-11-22: 4 mg via INTRAVENOUS
  Filled 2021-11-21: qty 2

## 2021-11-21 MED ORDER — GABAPENTIN 300 MG PO CAPS
300.0000 mg | ORAL_CAPSULE | Freq: Three times a day (TID) | ORAL | Status: DC
  Administered 2021-11-22 (×2): 300 mg via ORAL
  Filled 2021-11-21 (×2): qty 1

## 2021-11-21 MED ORDER — SODIUM CHLORIDE 0.9 % IV BOLUS
500.0000 mL | Freq: Once | INTRAVENOUS | Status: AC
  Administered 2021-11-21: 500 mL via INTRAVENOUS

## 2021-11-21 MED ORDER — ROSUVASTATIN CALCIUM 5 MG PO TABS
10.0000 mg | ORAL_TABLET | Freq: Every day | ORAL | Status: DC
  Administered 2021-11-22 (×2): 10 mg via ORAL
  Filled 2021-11-21 (×2): qty 2

## 2021-11-21 MED ORDER — CARVEDILOL 25 MG PO TABS: 50.0000 mg | ORAL_TABLET | Freq: Two times a day (BID) | ORAL | Status: DC

## 2021-11-21 MED ORDER — ENOXAPARIN SODIUM 30 MG/0.3ML IJ SOSY
30.0000 mg | PREFILLED_SYRINGE | INTRAMUSCULAR | Status: DC
  Administered 2021-11-22 (×2): 30 mg via SUBCUTANEOUS
  Filled 2021-11-21 (×3): qty 0.3

## 2021-11-21 MED ORDER — ASPIRIN 81 MG PO CHEW
324.0000 mg | CHEWABLE_TABLET | Freq: Once | ORAL | Status: AC
  Administered 2021-11-21: 324 mg via ORAL
  Filled 2021-11-21: qty 4

## 2021-11-21 NOTE — Progress Notes (Signed)
Patient escorted via w/c to ED with [facility], CMA. After registration, patient left with Collier Salina, PA-C in Triage; report given. Hubbard Hartshorn, BSN, RN-BC  ? ?

## 2021-11-21 NOTE — ED Provider Notes (Addendum)
?[place] ?Provider Note ? ? ?CSN: [id number] ?Arrival date & time: 11/21/21  1408 ? ?  ? ?History ? ?Chief Complaint  ?Patient presents with  ? Chest Pain  ? ? ?Amber Cross is a 61 y.o. female. ? ?Patient complains of left-sided chest pain as well as shortness of breath patient went to the internal medicine clinic today for evaluation after recently being hospitalized patient complained of chest pain at that visit and was sent to the emergency department for further evaluation patient states that she has been having pain and off since being discharged patient reports she is currently feeling more short of breath than she has been in the past and pain is occurring more frequently.  Patient complains of swelling in her legs.  Patient states she has a  congestive heart failure history and feels like she has increased fluid patient reports glucose has been elevated.  Patient reports that she took her blood pressure medicines today however she is out of one of her medications and her blood pressure has been running high.   ? ?The history is provided by the patient. No language interpreter was used.  ?Chest Pain ?Pain location:  L chest ?Pain quality: aching   ?Pain radiates to:  Does not radiate ?Pain severity:  Moderate ?Onset quality:  Gradual ?Timing:  Constant ?Progression:  Worsening ? ?  ? ?Home Medications ?Prior to Admission medications   ?Medication Sig Start Date End Date Taking? Authorizing Provider  ?acetaminophen (TYLENOL) 500 MG tablet Take 1,000 mg by mouth every 6 (six) hours as needed for mild pain.   Yes [provider]  ?albuterol (VENTOLIN HFA) 108 (90 Base) MCG/ACT inhaler Inhale 2 puffs into the lungs every 6 (six) hours as needed for wheezing or shortness of breath. 03/26/21  Yes [provider]  ?amLODipine (NORVASC) 5 MG tablet Take 1 tablet (5 mg total) by mouth daily. 11/10/21  Yes Dessa Phi, DO  ?busPIRone (BUSPAR) 5 MG tablet  Take 5 mg by mouth 2 (two) times daily. 10/01/21  Yes [provider]  ?carvedilol (COREG) 25 MG tablet Take 50 mg by mouth 2 (two) times daily with a meal.   Yes [provider]  ?Cholecalciferol (VITAMIN D) 50 MCG (2000 UT) tablet Take 2,000 Units by mouth daily.   Yes [provider]  ?DULoxetine (CYMBALTA) 30 MG capsule Take 30 mg by mouth daily.   Yes [provider]  ?furosemide (LASIX) 40 MG tablet Take 40 mg by mouth daily as needed for edema or fluid (swelling). 11/13/21  Yes [provider]  ?gabapentin (NEURONTIN) 300 MG capsule Take 300 mg by mouth 3 (three) times daily.   Yes [provider]  ?insulin isophane & regular human KwikPen (NOVOLIN 70/30 KWIKPEN) (70-30) 100 UNIT/ML KwikPen Inject 4 Units into the skin 2 (two) times daily before lunch and supper. ?Patient taking differently: Inject 10 Units into the skin 2 (two) times daily before lunch and supper. 11/09/21 12/09/21 Yes Dessa Phi, DO  ?losartan (COZAAR) 100 MG tablet Take 100 mg by mouth daily. 11/13/21  Yes [provider]  ?mirtazapine (REMERON) 15 MG tablet Take 15 mg by mouth at bedtime.   Yes [provider]  ?Multiple Vitamin (MULTI-VITAMIN) tablet Take 1 tablet by mouth daily.   Yes [provider]  ?nitroGLYCERIN (NITROSTAT) 0.4 MG SL tablet Place 0.4 mg under the tongue every 5 (five) minutes as needed for chest pain. 10/01/21  Yes [provider]  ?  rosuvastatin (CRESTOR) 20 MG tablet Take 10 mg by mouth daily.   Yes [provider]  ?Bayer Microlet Lancets lancets Check blood sugar up to 3 times a day as instructed 04/06/19   Katherine Roan, MD  ?carvedilol (COREG) 12.5 MG tablet Take 1 tablet (12.5 mg total) by mouth 2 (two) times daily with a meal. ?Patient taking differently: Take 50 mg by mouth 2 (two) times daily with a meal. 05/10/21 11/07/21  Antonieta Pert, MD  ?gabapentin (NEURONTIN) 300 MG capsule Take 3 capsules (900 mg total) by  mouth 3 (three) times daily. ?Patient taking differently: Take 300 mg by mouth daily. 05/08/20 11/07/21  Mosetta Anis, MD  ?glucose blood test strip Check blood sugar up to 3 times a day as instructed 04/06/19   Katherine Roan, MD  ?oxyCODONE-acetaminophen (PERCOCET/ROXICET) 5-325 MG tablet Take 1 tablet by mouth every 6 (six) hours as needed for severe pain. ?Patient not taking: Reported on 11/21/2021 10/25/21   Margette Fast, MD  ?polyethylene glycol (MIRALAX / GLYCOLAX) 17 g packet Take 17 g by mouth daily. ?Patient not taking: Reported on 11/21/2021 11/09/21   Dessa Phi, DO  ?senna-docusate (SENOKOT-S) 8.6-50 MG tablet Take 1 tablet by mouth at bedtime as needed for mild constipation. ?Patient not taking: Reported on 11/07/2021 10/25/21   Long, Wonda Olds, MD  ?   ? ?Allergies    ?Penicillins, Strawberry (diagnostic), and Tomato   ? ?Review of Systems   ?Review of Systems  ?Cardiovascular:  Positive for chest pain.  ?All other systems reviewed and are negative. ? ?Physical Exam ?Updated Vital Signs ?BP (!) 216/92   Pulse 81   Temp 98.5 ?F (36.9 ?C)   Resp 18   Ht 5\' 7"  (1.702 m)   Wt 56.2 kg   LMP 09/29/2011   SpO2 100%   BMI 19.39 kg/m?  ?Physical Exam ?Vitals and nursing note reviewed.  ?Constitutional:   ?   Appearance: She is well-developed.  ?HENT:  ?   Head: Normocephalic.  ?Cardiovascular:  ?   Rate and Rhythm: Normal rate and regular rhythm.  ?   Heart sounds: Normal heart sounds.  ?Pulmonary:  ?   Effort: Pulmonary effort is normal.  ?   Breath sounds: Normal breath sounds.  ?Abdominal:  ?   General: There is no distension.  ?   Palpations: Abdomen is soft.  ?Musculoskeletal:     ?   General: Normal range of motion.  ?   Cervical back: Normal range of motion.  ?   Right lower leg: Edema present.  ?Skin: ?   General: Skin is warm.  ?Neurological:  ?   General: No focal deficit present.  ?   Mental Status: She is alert and oriented to person, place, and time.  ? ? ?ED Results / Procedures /  Treatments   ?Labs ?(all labs ordered are listed, but only abnormal results are displayed) ?Labs Reviewed  ?COMPREHENSIVE METABOLIC PANEL - Abnormal; Notable for the following components:  ?    Result Value  ? Sodium 131 (*)   ? Potassium 3.3 (*)   ? Glucose, Bld 596 (*)   ? BUN 25 (*)   ? Creatinine, Ser 1.84 (*)   ? Calcium 7.7 (*)   ? Total Protein 5.4 (*)   ? Albumin 1.9 (*)   ? Alkaline Phosphatase 141 (*)   ? GFR, Estimated 31 (*)   ? All other components within normal limits  ?CBC WITH DIFFERENTIAL/PLATELET -  Abnormal; Notable for the following components:  ? WBC 11.8 (*)   ? RBC 2.82 (*)   ? Hemoglobin 8.2 (*)   ? HCT 24.7 (*)   ? Platelets 415 (*)   ? Neutro Abs 9.1 (*)   ? All other components within normal limits  ?BRAIN NATRIURETIC PEPTIDE - Abnormal; Notable for the following components:  ? B Natriuretic Peptide 335.2 (*)   ? All other components within normal limits  ?TROPONIN I (HIGH SENSITIVITY) - Abnormal; Notable for the following components:  ? Troponin I (High Sensitivity) 34 (*)   ? All other components within normal limits  ?TROPONIN I (HIGH SENSITIVITY)  ? ? ?EKG ?None ? ?Radiology ?DG Chest 2 View ? ?Result Date: 11/21/2021 ?CLINICAL DATA:  Chest pain for 1 day. EXAM: CHEST - 2 VIEW COMPARISON:  Chest radiograph 11/08/2021, 2 weeks ago. Chest CT 11/06/2021, earlier this month. FINDINGS: Stable cardiomegaly.The cardiomediastinal contours are normal. Small pulmonary nodules on prior CT are not seen by radiograph. Pulmonary vasculature is normal. No consolidation, pleural effusion, or pneumothorax. No acute osseous abnormalities are seen. IMPRESSION: Stable radiographic appearance of the chest with cardiomegaly. No acute abnormality. Electronically Signed   By: Keith Rake M.D.   On: 11/21/2021 15:02   ? ?Procedures ?Procedures  ? ? ?Medications Ordered in ED ?Medications  ?insulin aspart (novoLOG) injection 10 Units (has no administration in time range)  ?nitroGLYCERIN (NITROSTAT) SL tablet  0.4 mg (has no administration in time range)  ?aspirin chewable tablet 324 mg (324 mg Oral Given 11/21/21 1524)  ? ? ?ED Course/ Medical Decision Making/ A&P ?Clinical Course as of 11/21/21 1749  ?Wed Nov 21, 2021

## 2021-11-21 NOTE — ED Provider Triage Note (Addendum)
Emergency Medicine Provider Triage Evaluation Note ? ?Amber Cross , a 61 y.o. female  was evaluated in triage.  Pt complains of chest pain and shortness of breath.  Patient went to internal medicine appointment today and was sent to the emergency department for further evaluation.  Patient has been having chest pain constantly since discharge from the hospital 11/09/2021.  Chest pain became worse this morning from walking to her appointment.  Patient endorses associated shortness of breath and nausea.  Pain is located throughout her entire chest and feels like a band squeezing her.  Patient reports that she did not take her blood pressure medication this morning. ? ?Denies any palpitations, lightheadedness, syncope, vomiting, diaphoresis, abdominal pain. ? ?Review of Systems  ?Positive: Chest pain, shortness of breath, nausea ?Negative: See above ? ?Physical Exam  ?BP (!) 199/83   Pulse 94   Temp 98.5 ?F (36.9 ?C)   Resp 15   Ht 5\' 7"  (1.702 m)   Wt 56.2 kg   LMP 09/29/2011   SpO2 99%   BMI 19.39 kg/m?  ?Gen:   Awake, no distress   ?Resp:  Normal effort, clear to auscultation bilaterally ?MSK:   Moves extremities without difficulty  ?Other:  +2 radial pulse bilaterally.  Abdomen soft, nondistended, nontender with no mass or pulsatile mass. ? ?Medical Decision Making  ?Medically screening exam initiated at 2:21 PM.  Appropriate orders placed.  Amber Cross was informed that the remainder of the evaluation will be completed by another provider, this initial triage assessment does not replace that evaluation, and the importance of remaining in the ED until their evaluation is complete. ? ?ACS work-up initiated.  Patient ordered 324 mg aspirin. ?  ?Loni Beckwith, PA-C ?11/21/21 1423 ? ?  ?Loni Beckwith, PA-C ?11/21/21 1423 ? ?  ?Loni Beckwith, PA-C ?11/21/21 1424 ? ?

## 2021-11-21 NOTE — ED Provider Notes (Incomplete Revision)
?[place] ?Provider Note ? ? ?CSN: [id number] ?Arrival date & time: 11/21/21  1408 ? ?  ? ?History ? ?Chief Complaint  ?Patient presents with  ?? Chest Pain  ? ? ?Amber Cross is a 61 y.o. female. ? ?Patient complains of left-sided chest pain as well as shortness of breath patient went to the internal medicine clinic today for evaluation after recently being hospitalized patient complained of chest pain at that visit and was sent to the emergency department for further evaluation patient states that she has been having pain and off since being discharged patient reports she is currently feeling more short of breath than she has been in the past and pain is occurring more frequently.  Patient complains of swelling in her legs.  Patient states she has a  congestive heart failure history and feels like she has increased fluid patient reports glucose has been elevated.  Patient reports that she took her blood pressure medicines today however she is out of one of her medications and her blood pressure has been running high.   ? ?The history is provided by the patient. No language interpreter was used.  ?Chest Pain ?Pain location:  L chest ?Pain quality: aching   ?Pain radiates to:  Does not radiate ?Pain severity:  Moderate ?Onset quality:  Gradual ?Timing:  Constant ?Progression:  Worsening ? ?  ? ?Home Medications ?Prior to Admission medications   ?Medication Sig Start Date End Date Taking? Authorizing Provider  ?acetaminophen (TYLENOL) 500 MG tablet Take 1,000 mg by mouth every 6 (six) hours as needed for mild pain.   Yes [provider]  ?albuterol (VENTOLIN HFA) 108 (90 Base) MCG/ACT inhaler Inhale 2 puffs into the lungs every 6 (six) hours as needed for wheezing or shortness of breath. 03/26/21  Yes [provider]  ?amLODipine (NORVASC) 5 MG tablet Take 1 tablet (5 mg total) by mouth daily. 11/10/21  Yes Dessa Phi, DO  ?busPIRone (BUSPAR) 5 MG tablet  Take 5 mg by mouth 2 (two) times daily. 10/01/21  Yes [provider]  ?carvedilol (COREG) 25 MG tablet Take 50 mg by mouth 2 (two) times daily with a meal.   Yes [provider]  ?Cholecalciferol (VITAMIN D) 50 MCG (2000 UT) tablet Take 2,000 Units by mouth daily.   Yes [provider]  ?DULoxetine (CYMBALTA) 30 MG capsule Take 30 mg by mouth daily.   Yes [provider]  ?furosemide (LASIX) 40 MG tablet Take 40 mg by mouth daily as needed for edema or fluid (swelling). 11/13/21  Yes [provider]  ?gabapentin (NEURONTIN) 300 MG capsule Take 300 mg by mouth 3 (three) times daily.   Yes [provider]  ?insulin isophane & regular human KwikPen (NOVOLIN 70/30 KWIKPEN) (70-30) 100 UNIT/ML KwikPen Inject 4 Units into the skin 2 (two) times daily before lunch and supper. ?Patient taking differently: Inject 10 Units into the skin 2 (two) times daily before lunch and supper. 11/09/21 12/09/21 Yes Dessa Phi, DO  ?losartan (COZAAR) 100 MG tablet Take 100 mg by mouth daily. 11/13/21  Yes [provider]  ?mirtazapine (REMERON) 15 MG tablet Take 15 mg by mouth at bedtime.   Yes [provider]  ?Multiple Vitamin (MULTI-VITAMIN) tablet Take 1 tablet by mouth daily.   Yes [provider]  ?nitroGLYCERIN (NITROSTAT) 0.4 MG SL tablet Place 0.4 mg under the tongue every 5 (five) minutes as needed for chest pain. 10/01/21  Yes [provider]  ?  rosuvastatin (CRESTOR) 20 MG tablet Take 10 mg by mouth daily.   Yes [provider]  ?Bayer Microlet Lancets lancets Check blood sugar up to 3 times a day as instructed 04/06/19   Katherine Roan, MD  ?carvedilol (COREG) 12.5 MG tablet Take 1 tablet (12.5 mg total) by mouth 2 (two) times daily with a meal. ?Patient taking differently: Take 50 mg by mouth 2 (two) times daily with a meal. 05/10/21 11/07/21  Antonieta Pert, MD  ?gabapentin (NEURONTIN) 300 MG capsule Take 3 capsules (900 mg total) by  mouth 3 (three) times daily. ?Patient taking differently: Take 300 mg by mouth daily. 05/08/20 11/07/21  Mosetta Anis, MD  ?glucose blood test strip Check blood sugar up to 3 times a day as instructed 04/06/19   Katherine Roan, MD  ?oxyCODONE-acetaminophen (PERCOCET/ROXICET) 5-325 MG tablet Take 1 tablet by mouth every 6 (six) hours as needed for severe pain. ?Patient not taking: Reported on 11/21/2021 10/25/21   Margette Fast, MD  ?polyethylene glycol (MIRALAX / GLYCOLAX) 17 g packet Take 17 g by mouth daily. ?Patient not taking: Reported on 11/21/2021 11/09/21   Dessa Phi, DO  ?senna-docusate (SENOKOT-S) 8.6-50 MG tablet Take 1 tablet by mouth at bedtime as needed for mild constipation. ?Patient not taking: Reported on 11/07/2021 10/25/21   Long, Wonda Olds, MD  ?   ? ?Allergies    ?Penicillins, Strawberry (diagnostic), and Tomato   ? ?Review of Systems   ?Review of Systems  ?Cardiovascular:  Positive for chest pain.  ?All other systems reviewed and are negative. ? ?Physical Exam ?Updated Vital Signs ?BP (!) 216/92   Pulse 81   Temp 98.5 ?F (36.9 ?C)   Resp 18   Ht 5\' 7"  (1.702 m)   Wt 56.2 kg   LMP 09/29/2011   SpO2 100%   BMI 19.39 kg/m?  ?Physical Exam ?Vitals and nursing note reviewed.  ?Constitutional:   ?   Appearance: She is well-developed.  ?HENT:  ?   Head: Normocephalic.  ?Cardiovascular:  ?   Rate and Rhythm: Normal rate and regular rhythm.  ?   Heart sounds: Normal heart sounds.  ?Pulmonary:  ?   Effort: Pulmonary effort is normal.  ?   Breath sounds: Normal breath sounds.  ?Abdominal:  ?   General: There is no distension.  ?   Palpations: Abdomen is soft.  ?Musculoskeletal:     ?   General: Normal range of motion.  ?   Cervical back: Normal range of motion.  ?   Right lower leg: Edema present.  ?Skin: ?   General: Skin is warm.  ?Neurological:  ?   General: No focal deficit present.  ?   Mental Status: She is alert and oriented to person, place, and time.  ? ? ?ED Results / Procedures /  Treatments   ?Labs ?(all labs ordered are listed, but only abnormal results are displayed) ?Labs Reviewed  ?COMPREHENSIVE METABOLIC PANEL - Abnormal; Notable for the following components:  ?    Result Value  ? Sodium 131 (*)   ? Potassium 3.3 (*)   ? Glucose, Bld 596 (*)   ? BUN 25 (*)   ? Creatinine, Ser 1.84 (*)   ? Calcium 7.7 (*)   ? Total Protein 5.4 (*)   ? Albumin 1.9 (*)   ? Alkaline Phosphatase 141 (*)   ? GFR, Estimated 31 (*)   ? All other components within normal limits  ?CBC WITH DIFFERENTIAL/PLATELET -  Abnormal; Notable for the following components:  ? WBC 11.8 (*)   ? RBC 2.82 (*)   ? Hemoglobin 8.2 (*)   ? HCT 24.7 (*)   ? Platelets 415 (*)   ? Neutro Abs 9.1 (*)   ? All other components within normal limits  ?BRAIN NATRIURETIC PEPTIDE - Abnormal; Notable for the following components:  ? B Natriuretic Peptide 335.2 (*)   ? All other components within normal limits  ?TROPONIN I (HIGH SENSITIVITY) - Abnormal; Notable for the following components:  ? Troponin I (High Sensitivity) 34 (*)   ? All other components within normal limits  ?TROPONIN I (HIGH SENSITIVITY)  ? ? ?EKG ?None ? ?Radiology ?DG Chest 2 View ? ?Result Date: 11/21/2021 ?CLINICAL DATA:  Chest pain for 1 day. EXAM: CHEST - 2 VIEW COMPARISON:  Chest radiograph 11/08/2021, 2 weeks ago. Chest CT 11/06/2021, earlier this month. FINDINGS: Stable cardiomegaly.The cardiomediastinal contours are normal. Small pulmonary nodules on prior CT are not seen by radiograph. Pulmonary vasculature is normal. No consolidation, pleural effusion, or pneumothorax. No acute osseous abnormalities are seen. IMPRESSION: Stable radiographic appearance of the chest with cardiomegaly. No acute abnormality. Electronically Signed   By: Keith Rake M.D.   On: 11/21/2021 15:02   ? ?Procedures ?Procedures  ? ? ?Medications Ordered in ED ?Medications  ?insulin aspart (novoLOG) injection 10 Units (has no administration in time range)  ?nitroGLYCERIN (NITROSTAT) SL tablet  0.4 mg (has no administration in time range)  ?aspirin chewable tablet 324 mg (324 mg Oral Given 11/21/21 1524)  ? ? ?ED Course/ Medical Decision Making/ A&P ?Clinical Course as of 11/21/21 1749  ?Wed Nov 21, 2021

## 2021-11-21 NOTE — H&P (Addendum)
? ? ? ?Date: 11/21/2021     ?     ?     ?Patient Name:  Amber Cross MRN: [id number]  ?DOB: Nov 21, 1960 Age / Sex: 61 y.o., female   ?PCP: [name], DO    ?     ?Medical Service: Internal Medicine Teaching Service    ?     ?Attending Physician: Dr. Sid Falcon, MD    ?First Contact: Dr. Earley Favor Pager: [phone]  ?Second Contact: Dr. Lisabeth Devoid Pager: [phone]  ?     ?After Hours (After 5p/  First Contact Pager: [phone]  ?weekends / holidays): Second Contact Pager: [phone]  ? ?Chief Complaint: chest and epigastric/RUQ pain, SOB, hematochezia, left-sided weakness and numbness ? ?History of Present Illness: Amber Cross is a 61 y.o. female with a PMHx of T2DM, HTN, HLD, tobacco abuse, anxiety, HFpEF (EF 55-60% in April 2023), CKD stage IIIb, and a recent admission at [facility] for noncardiac chest pain thought to be of MSK etiology who presents to the ED today with chief complaints of chest and RUQ/epigastric pain, SOB, hematochezia, left-sided weakness and numbness. ? ?The patient has had chest pain for the last several months, and most recently she was hospitalized at [facility] long.  At that time, her chest pain was worked up and determined to be noncardiac in nature, likely MSK in etiology.  She was discharged on April 14, however she states that her pain has been constant since then.  The pain is mainly located in the RUQ area, as well as epigastric pain and spreading across her chest.  She describes the pain as a burning sensation.  She also endorses dyspnea on exertion, hematuria, and hematochezia, but she denies any dysuria, nausea/vomiting, or blurry vision.  Over the weekend, her hematochezia started; she states that she notices bright red blood when she wipes that looks as if "my period just started."  Additional complaints include left sided weakness.  She states that she is not able to grip objects in her left hand since this past weekend.  She also states that she has fallen a few times over the weekend but  has not hit her head.  She said, due to her weakness, it took her 30 minutes to get up off the floor each time she fell.  Also endorses balance problems in the last couple weeks.  Also endorses a frontal/temporal he located headache that started over the weekend.  She uses a walker to ambulate.  There are no other complaints or concerns at this time. ? ?Meds:  ?Current Meds  ?Medication Sig  ? acetaminophen (TYLENOL) 500 MG tablet Take 1,000 mg by mouth every 6 (six) hours as needed for mild pain.  ? albuterol (VENTOLIN HFA) 108 (90 Base) MCG/ACT inhaler Inhale 2 puffs into the lungs every 6 (six) hours as needed for wheezing or shortness of breath.  ? amLODipine (NORVASC) 5 MG tablet Take 1 tablet (5 mg total) by mouth daily.  ? busPIRone (BUSPAR) 5 MG tablet Take 5 mg by mouth 2 (two) times daily.  ? carvedilol (COREG) 25 MG tablet Take 50 mg by mouth 2 (two) times daily with a meal.  ? Cholecalciferol (VITAMIN D) 50 MCG (2000 UT) tablet Take 2,000 Units by mouth daily.  ? DULoxetine (CYMBALTA) 30 MG capsule Take 30 mg by mouth daily.  ? furosemide (LASIX) 40 MG tablet Take 40 mg by mouth daily as needed for edema or fluid (swelling).  ? gabapentin (NEURONTIN) 300 MG capsule Take 300  mg by mouth 3 (three) times daily.  ? insulin isophane & regular human KwikPen (NOVOLIN 70/30 KWIKPEN) (70-30) 100 UNIT/ML KwikPen Inject 4 Units into the skin 2 (two) times daily before lunch and supper. (Patient taking differently: Inject 10 Units into the skin 2 (two) times daily before lunch and supper.)  ? losartan (COZAAR) 100 MG tablet Take 100 mg by mouth daily.  ? mirtazapine (REMERON) 15 MG tablet Take 15 mg by mouth at bedtime.  ? Multiple Vitamin (MULTI-VITAMIN) tablet Take 1 tablet by mouth daily.  ? nitroGLYCERIN (NITROSTAT) 0.4 MG SL tablet Place 0.4 mg under the tongue every 5 (five) minutes as needed for chest pain.  ? rosuvastatin (CRESTOR) 20 MG tablet Take 10 mg by mouth daily.  ? ? ?Allergies: ?Allergies as of  11/21/2021 - Review Complete 11/21/2021  ?Allergen Reaction Noted  ? Penicillins  01/16/2016  ? Strawberry (diagnostic) Itching 05/07/2021  ? Tomato Itching 05/07/2021  ? ?Past Medical History:  ?Diagnosis Date  ? Diabetes mellitus   ? Hypercholesteremia   ? Hypertension   ? Tobacco abuse   ?s/p "Small intestine surgery" - years ago per patient ? ?Family history: Diabetes in both parents and sister. Cancer in aunt. Heart disease in sister.  ? ?Social history: Lives in [place] with her son. Smokes 1/2 ppd now. Denies any ETOH use. Occasionally marijuana use, last time used was yesterday.  ? ?Review of Systems: ?A complete ROS was negative except as per HPI.  ? ?Physical Exam: ?Blood pressure (!) 176/76, pulse 87, temperature 98.5 ?F (36.9 ?C), resp. rate 19, height _0  (1.702 m), weight 56.2 kg, last menstrual period 09/29/2011, SpO2 100 %. ?General: NAD, uncomfortable and anxious-appearing ?HE: Normocephalic, atraumatic, EOMI, Conjunctivae normal ?ENT: No congestion, no rhinorrhea, no exudate or erythema  ?Cardiovascular: Normal rate, regular rhythm. I/VI holosystolic murmur at LUSB, rubs, or gallops ?Pulmonary: Effort normal, breath sounds normal. No wheezes, rales, or rhonchi ?Abdominal: Soft, bowel sounds present, tenderness to palpation of RUQ but otherwise nontender.  No rebound or guarding present. ?Musculoskeletal: Trace pitting edema in BLE. No deformity, injury, or tenderness in extremities, no chest wall tenderness ?Skin: Warm, dry, no bruising, erythema, or rash ?Neurologic exam: ?Mental status: A&Ox3 ?Cranial Nerves: ?            II: PERRL ?            III, IV, VI: Extra-occular motions intact bilaterally ?            V, VII: Face symmetric, decreased sensation in V1, sensation is intact in V2 and V3              ?            IX, X: Palate rises symmetrically ?            XI: Shoulder shrug is normal bilaterally   ?            XII: Tongue midline    ?Motor: Strength 5/5 on all upper and lower  extremities, bulk muscle and tone are normal  ?Sensory: Decreased sensation of LUE, but otherwise intact throughout ?Coordination: There is dysmetria on finger-to-nose with the left hand, right-side WNL. ?Psychiatric: Normal mood and affect ? ?EKG: personally reviewed my interpretation is J-point elevation in leads V2 and V3, sinus rhythm, LVH, RAE, prolonged QT ? ?CXR: personally reviewed my interpretation is stable cardiomegaly without acute abnormality ? ?CT head: unremarkable for acute intracranial abnormality ? ?RUQ ultrasound: unremarkable ? ? ?  Assessment & Plan by Problem: ?Principal Problem: ?  Severe hypertension ? ?Severe hypertension ?Chest pain ?The patient presents here today with multiple complaints and with systolic BP's in the 831D on admit.  Work-up so far is not remarkable for ACS: troponin 34->35, likely reflecting demand ischemia, and her EKG shows J-point elevation in V2 and V3 but is otherwise unremarkable. CT head has been unremarkable for an ischemic stroke, however patient does have neurologic deficits on exam today.  Additionally, there are no signs of pulmonary edema on chest x-ray today.  Other labs reveal creatinine levels around patient's baseline. Overall, no evidence of end-organ damage on workup so far, but will obtain MRI brain given new neurologic deficits per history and exam, and we will slowly lower patient's BP as below. ?-Resumed home losartan 100 mg p.o. daily ?-Resumed home Coreg 50 mg p.o. twice daily ?-Resumed amlodipine 5 mg daily ?-MRI brain pending ?-Telemetry ?-UA pending ? ?Epigastric pain ?The patient had a recent admission for chest pain, and she is here today for ACS rule out, however her pain seems to be localized in the RUQ region as well.  Work-up is significant for alk phos elevation to 141, though RUQ [place] is unremarkable. Lipase is mildly elevated at 65.  ?-GGT pending ? ?CKD stage IIIb ?Hypokalemia ?Baseline creatinine ~ 1.7. Patient presents with a Cr today  of 1.84.  ?-Avoid nephrotoxic medications ?-Trend BMP ?-Repleting 40 mEQ ?  ?HFpEF ?Echocardiogram was performed during patient's last hospitalization and revealed EF 55-60%.  BNP 335.2 today, which is down from

## 2021-11-21 NOTE — Progress Notes (Signed)
? ?  CC: Chest pain ? ?HPI: ? ?[name] is a 61 y.o. with past medical history of hypertension, type 2 diabetes, history of HFrEF now HFpEF, who presents to the clinic today for active chest pain. ? ?Please see problem by charting for detail ? ?Past Medical History:  ?Diagnosis Date  ? Diabetes mellitus   ? Hypercholesteremia   ? Hypertension   ? Tobacco abuse   ? ?Review of Systems:  per HPI ? ?Physical Exam: ? ?Vitals:  ? 11/21/21 1416  ?BP: (!) 224/82  ?Pulse: 92  ?Temp: 98 ?F (36.7 ?C)  ?TempSrc: Oral  ?Weight: 123 lb 12.8 oz (56.2 kg)  ? ? ?Physical Exam ?Constitutional:   ?   General: She is in acute distress.  ?   Appearance: She is ill-appearing.  ?HENT:  ?   Head: Normocephalic.  ?Eyes:  ?   General:     ?   Right eye: No discharge.     ?   Left eye: No discharge.  ?   Conjunctiva/sclera: Conjunctivae normal.  ?Pulmonary:  ?   Effort: Respiratory distress present.  ?Musculoskeletal:     ?   General: No deformity.  ?Skin: ?   General: Skin is warm.  ?Neurological:  ?   General: No focal deficit present.  ?Psychiatric:     ?   Mood and Affect: Mood normal.  ?  ? ?Assessment & Plan:  ? ?See Encounters Tab for problem based charting. ? ?Chest pain ?Patient presents to the clinic today for acute chest pain.  She was recently hospitalized for chest pain and discharged on 11/09/2021.  She states that her chest pain has been constant since last discharge.  Pain is worse with exertion and better with rest.  She has been walking from the main entrance down to the [facility] clinic and now having worsening chest pain and shortness of breath. ? ?She was hospitalized 2 weeks ago for chest pain.  Troponin was mildly elevated at 27-19-19.  EKG did not show any ischemia.  D-dimer was elevated at 6.2.  Perfusion scan were negative for PE.  Doppler ultrasound was negative as well.  Echocardiogram showed normal EF with no regional wall abnormality. ? ?Patient also have hemoptysis which prompted a CT chest, which showed a 9  mm nodule in the right upper lobe with subpleural micronodules.  She has an outpatient follow-up with pulmonology on 5/8 but no consult note written on last hospitalization. ? ?Patient appears in acute distress in the clinic today.  Endorses ongoing chest pain.  She appears dyspneic as well.  Blood pressure significant elevated with systolic in the 102V. ? ?Patient will be transferred to the emergency room for urgent evaluation to rule out ACS.  I spoke to the ED charge nurse, Carlis Abbott, about her arrival. ? ?Other differential including pulmonary causes.  She does have hemoptysis with pulmonary nodule concerning for lung cancer.  She may benefit from pulmonary consult with further imaging and bronchoscopy.  ? ?Her hemoglobin also trending down significantly in the last 6 months with evidence of iron deficiency on blood work.  There is also need to be worked up further when she is more stable.  ? ?Patient discussed with Dr. Heber Canadian  ?

## 2021-11-21 NOTE — ED Triage Notes (Signed)
Complains of chest pain and sob Reports CHF, ?

## 2021-11-21 NOTE — Hospital Course (Addendum)
Reports epigastric pain. The pain is described as burning sensation.  ? ?Endorse dyspnea on exertion, hematuria, bloody stools but denies any dysuria, N/V, blurry vision ? ?Reports a small hole in her intestine years ago ? ?Endorse hematochezia that started over the weekend. Bright red blood when she wipes.  ? ?Reports she cannot hold anything in left hand since this weekend. Endorses falling 3 times over the weekend. It took her about 30 min to get off the floor each time. She has had balance problems in the last couple of weeks. Uses a walker to ambulate  ? ?Fhx: Diabetes in both parents and sister. Cancer in aunt. Heart disease in sister.  ? ?Shx: Lives in [place]. Lives with son. Smokes 1/2 ppd now. Denies any ETOH use. Occasionally marijuana use, last time yesterday.  ?

## 2021-11-21 NOTE — Assessment & Plan Note (Addendum)
Patient presents to the clinic today for acute chest pain.  She was recently hospitalized for chest pain and discharged on 11/09/2021.  She states that her chest pain has been constant since last discharge.  Pain is worse with exertion and better with rest.  She has been walking from the main entrance down to the [facility] clinic and now having worsening chest pain and shortness of breath. ? ?She was hospitalized 2 weeks ago for chest pain.  Troponin was mildly elevated at 27-19-19.  EKG did not show any ischemia.  D-dimer was elevated at 6.2.  Perfusion scan were negative for PE.  Doppler ultrasound was negative as well.  Echocardiogram showed normal EF with no regional wall abnormality. ? ?Patient also have hemoptysis which prompted a CT chest, which showed a 9 mm nodule in the right upper lobe with subpleural micronodules.  She has an outpatient follow-up with pulmonology on 5/8 but no consult note written on last hospitalization. ? ?Patient appears in acute distress in the clinic today.  Endorses ongoing chest pain.  She appears dyspneic as well.  Blood pressure significant elevated with systolic in the 749S. ? ?Patient will be transferred to the emergency room for urgent evaluation to rule out ACS.  I spoke to the ED charge nurse, Carlis Abbott, about her arrival. ? ?Other differential including pulmonary causes.  She does have hemoptysis with pulmonary nodule concerning for lung cancer.  Questionable PE but ruled out with negative perfusion scan. She may benefit from pulmonary consult with further imaging and bronchoscopy.  ? ?Her hemoglobin also trending down significantly in the last 6 months with evidence of iron deficiency on blood work.  There is also need to be worked up further when she is more stable. ?

## 2021-11-21 NOTE — ED Notes (Signed)
This RN gave pt insulin via Subq instead of intravenously. This RN informed PA of subq administration. PA [place], to try and change order form IV to subq.  ?

## 2021-11-21 NOTE — ED Notes (Signed)
Patient transported to CT 

## 2021-11-21 NOTE — ED Notes (Signed)
Pt requested blood draw be done through IV ?

## 2021-11-22 ENCOUNTER — Observation Stay (HOSPITAL_COMMUNITY): Payer: 59

## 2021-11-22 DIAGNOSIS — E1142 Type 2 diabetes mellitus with diabetic polyneuropathy: Secondary | ICD-10-CM | POA: Diagnosis not present

## 2021-11-22 DIAGNOSIS — E7800 Pure hypercholesterolemia, unspecified: Secondary | ICD-10-CM | POA: Diagnosis present

## 2021-11-22 DIAGNOSIS — I5032 Chronic diastolic (congestive) heart failure: Secondary | ICD-10-CM | POA: Diagnosis present

## 2021-11-22 DIAGNOSIS — R159 Full incontinence of feces: Secondary | ICD-10-CM | POA: Diagnosis not present

## 2021-11-22 DIAGNOSIS — F17210 Nicotine dependence, cigarettes, uncomplicated: Secondary | ICD-10-CM | POA: Diagnosis present

## 2021-11-22 DIAGNOSIS — R1013 Epigastric pain: Secondary | ICD-10-CM | POA: Diagnosis present

## 2021-11-22 DIAGNOSIS — R278 Other lack of coordination: Secondary | ICD-10-CM | POA: Diagnosis present

## 2021-11-22 DIAGNOSIS — D72829 Elevated white blood cell count, unspecified: Secondary | ICD-10-CM | POA: Diagnosis present

## 2021-11-22 DIAGNOSIS — D500 Iron deficiency anemia secondary to blood loss (chronic): Secondary | ICD-10-CM | POA: Diagnosis present

## 2021-11-22 DIAGNOSIS — K921 Melena: Secondary | ICD-10-CM | POA: Diagnosis present

## 2021-11-22 DIAGNOSIS — Y92000 Kitchen of unspecified non-institutional (private) residence as  the place of occurrence of the external cause: Secondary | ICD-10-CM | POA: Diagnosis not present

## 2021-11-22 DIAGNOSIS — R739 Hyperglycemia, unspecified: Secondary | ICD-10-CM | POA: Diagnosis present

## 2021-11-22 DIAGNOSIS — I10 Essential (primary) hypertension: Secondary | ICD-10-CM

## 2021-11-22 DIAGNOSIS — Z681 Body mass index (BMI) 19 or less, adult: Secondary | ICD-10-CM | POA: Diagnosis not present

## 2021-11-22 DIAGNOSIS — Z833 Family history of diabetes mellitus: Secondary | ICD-10-CM | POA: Diagnosis not present

## 2021-11-22 DIAGNOSIS — I6389 Other cerebral infarction: Secondary | ICD-10-CM | POA: Diagnosis present

## 2021-11-22 DIAGNOSIS — W19XXXA Unspecified fall, initial encounter: Secondary | ICD-10-CM | POA: Diagnosis present

## 2021-11-22 DIAGNOSIS — E440 Moderate protein-calorie malnutrition: Secondary | ICD-10-CM | POA: Diagnosis present

## 2021-11-22 DIAGNOSIS — N1832 Chronic kidney disease, stage 3b: Secondary | ICD-10-CM | POA: Diagnosis present

## 2021-11-22 DIAGNOSIS — E1122 Type 2 diabetes mellitus with diabetic chronic kidney disease: Secondary | ICD-10-CM | POA: Diagnosis present

## 2021-11-22 DIAGNOSIS — Z79899 Other long term (current) drug therapy: Secondary | ICD-10-CM | POA: Diagnosis not present

## 2021-11-22 DIAGNOSIS — F329 Major depressive disorder, single episode, unspecified: Secondary | ICD-10-CM | POA: Diagnosis present

## 2021-11-22 DIAGNOSIS — I130 Hypertensive heart and chronic kidney disease with heart failure and stage 1 through stage 4 chronic kidney disease, or unspecified chronic kidney disease: Secondary | ICD-10-CM | POA: Diagnosis present

## 2021-11-22 DIAGNOSIS — Z91018 Allergy to other foods: Secondary | ICD-10-CM | POA: Diagnosis not present

## 2021-11-22 DIAGNOSIS — R296 Repeated falls: Secondary | ICD-10-CM | POA: Diagnosis present

## 2021-11-22 DIAGNOSIS — Z880 Allergy status to penicillin: Secondary | ICD-10-CM | POA: Diagnosis not present

## 2021-11-22 DIAGNOSIS — G8324 Monoplegia of upper limb affecting left nondominant side: Secondary | ICD-10-CM | POA: Diagnosis present

## 2021-11-22 DIAGNOSIS — D509 Iron deficiency anemia, unspecified: Secondary | ICD-10-CM | POA: Diagnosis present

## 2021-11-22 DIAGNOSIS — I639 Cerebral infarction, unspecified: Secondary | ICD-10-CM | POA: Diagnosis not present

## 2021-11-22 DIAGNOSIS — I248 Other forms of acute ischemic heart disease: Secondary | ICD-10-CM | POA: Diagnosis present

## 2021-11-22 DIAGNOSIS — I63312 Cerebral infarction due to thrombosis of left middle cerebral artery: Secondary | ICD-10-CM | POA: Diagnosis not present

## 2021-11-22 DIAGNOSIS — E1165 Type 2 diabetes mellitus with hyperglycemia: Secondary | ICD-10-CM | POA: Diagnosis present

## 2021-11-22 LAB — GLUCOSE, CAPILLARY
Glucose-Capillary: 188 mg/dL — ABNORMAL HIGH (ref 70–99)
Glucose-Capillary: 206 mg/dL — ABNORMAL HIGH (ref 70–99)
Glucose-Capillary: 279 mg/dL — ABNORMAL HIGH (ref 70–99)
Glucose-Capillary: 283 mg/dL — ABNORMAL HIGH (ref 70–99)
Glucose-Capillary: 182 mg/dL — ABNORMAL HIGH (ref 70–99)
Glucose-Capillary: 254 mg/dL — ABNORMAL HIGH (ref 70–99)

## 2021-11-22 LAB — COMPREHENSIVE METABOLIC PANEL
ALT: 14 U/L (ref 0–44)
AST: 17 U/L (ref 15–41)
Albumin: <1.5 g/dL — ABNORMAL LOW (ref 3.5–5.0)
Alkaline Phosphatase: 92 U/L (ref 38–126)
Anion gap: 5 (ref 5–15)
BUN: 21 mg/dL — ABNORMAL HIGH (ref 6–20)
CO2: 24 mmol/L (ref 22–32)
Calcium: 7.4 mg/dL — ABNORMAL LOW (ref 8.9–10.3)
Chloride: 107 mmol/L (ref 98–111)
Creatinine, Ser: 1.67 mg/dL — ABNORMAL HIGH (ref 0.44–1.00)
GFR, Estimated: 35 mL/min — ABNORMAL LOW (ref >60)
Glucose, Bld: 169 mg/dL — ABNORMAL HIGH (ref 70–99)
Potassium: 3.3 mmol/L — ABNORMAL LOW (ref 3.5–5.1)
Sodium: 136 mmol/L (ref 135–145)
Total Bilirubin: 0.4 mg/dL (ref 0.3–1.2)
Total Protein: 4.5 g/dL — ABNORMAL LOW (ref 6.5–8.1)

## 2021-11-22 LAB — CBC
HCT: 21.2 % — ABNORMAL LOW (ref 36.0–46.0)
HCT: 23.6 % — ABNORMAL LOW (ref 36.0–46.0)
Hemoglobin: 7.2 g/dL — ABNORMAL LOW (ref 12.0–15.0)
Hemoglobin: 7.8 g/dL — ABNORMAL LOW (ref 12.0–15.0)
MCH: 28.8 pg (ref 26.0–34.0)
MCH: 28.7 pg (ref 26.0–34.0)
MCHC: 34 g/dL (ref 30.0–36.0)
MCHC: 33.1 g/dL (ref 30.0–36.0)
MCV: 84.8 fL (ref 80.0–100.0)
MCV: 86.8 fL (ref 80.0–100.0)
Platelets: 336 10*3/uL (ref 150–400)
Platelets: 377 10*3/uL (ref 150–400)
RBC: 2.5 MIL/uL — ABNORMAL LOW (ref 3.87–5.11)
RBC: 2.72 MIL/uL — ABNORMAL LOW (ref 3.87–5.11)
RDW: 13.9 % (ref 11.5–15.5)
RDW: 14.1 % (ref 11.5–15.5)
WBC: 11.4 10*3/uL — ABNORMAL HIGH (ref 4.0–10.5)
WBC: 12.6 10*3/uL — ABNORMAL HIGH (ref 4.0–10.5)
nRBC: 0 % (ref 0.0–0.2)
nRBC: 0 % (ref 0.0–0.2)

## 2021-11-22 LAB — GAMMA GT: GGT: 28 U/L (ref 7–50)

## 2021-11-22 LAB — MRSA NEXT GEN BY PCR, NASAL: MRSA by PCR Next Gen: NOT DETECTED

## 2021-11-22 LAB — TSH: TSH: 1.989 u[IU]/mL (ref 0.350–4.500)

## 2021-11-22 LAB — CBG MONITORING, ED: Glucose-Capillary: 147 mg/dL — ABNORMAL HIGH (ref 70–99)

## 2021-11-22 IMAGING — MR MR HEAD W/O CM
12 of 13 series · 44 of 48 positions shown · non-contrast
Comparison: Head CT [DATE].

CLINICAL DATA: 60-year-old female with hypertension, chest pain,
neurologic deficit.

EXAM:
MRI HEAD WITHOUT CONTRAST
TECHNIQUE: Multiplanar, multiecho pulse sequences of the brain and surrounding
structures were obtained without intravenous contrast.

[Series 5: DWI · axial · 3.0mm · 0.92mm/px · z∈[-112,+37]mm · 7 of 112 slices shown (1 of 4)]
[im 1/112]
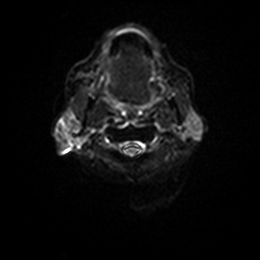
[im 19/112]
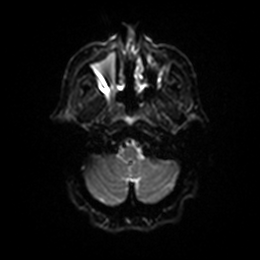
[im 38/112]
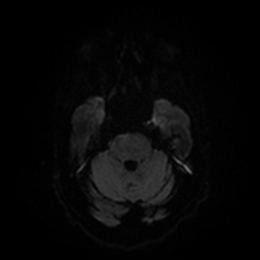
[im 56/112]
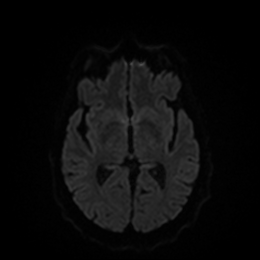
[im 75/112]
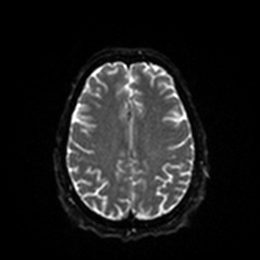
[im 93/112]
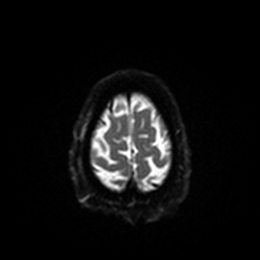
[im 112/112]
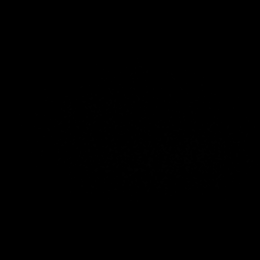

[Series 6: DWI · axial · 3.0mm · 0.92mm/px · z∈[-112,+34]mm · 4 of 55 slices shown (2 of 4)]
[im 1/55]
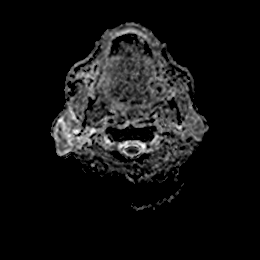
[im 19/55]
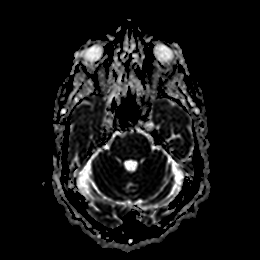
[im 37/55]
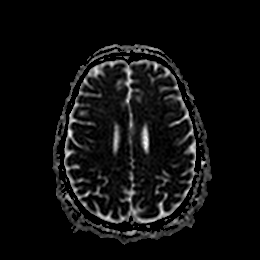
[im 55/55]
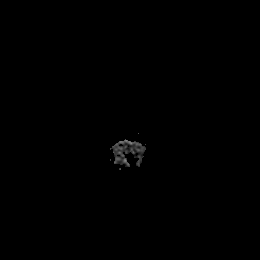

[Series 7: DWI · coronal · 4.0mm · 0.88mm/px · 6 of 80 slices shown (3 of 4)]
[im 1/80]
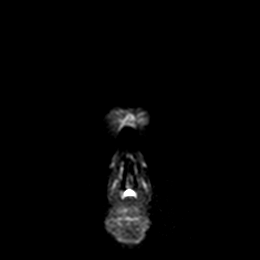
[im 16/80]
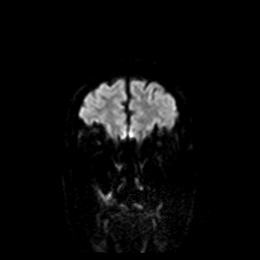
[im 32/80]
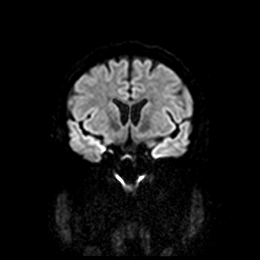
[im 48/80]
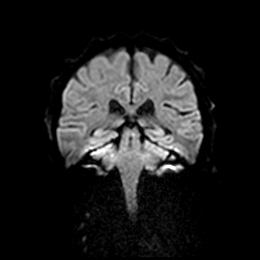
[im 64/80]
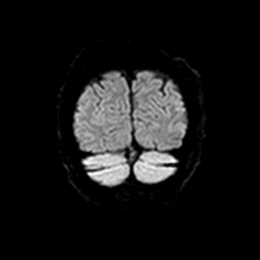
[im 80/80]
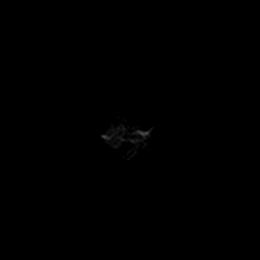

[Series 8: DWI · coronal · 4.0mm · 0.88mm/px · 3 of 40 slices shown (4 of 4)]
[im 1/40]
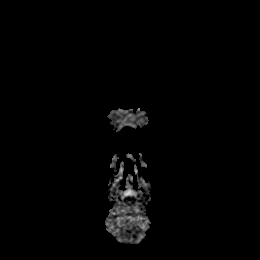
[im 20/40]
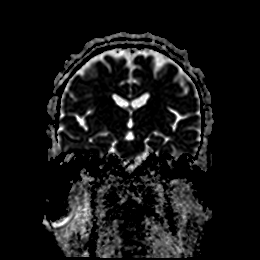
[im 40/40]
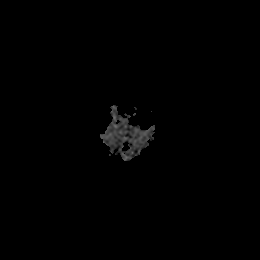

[Series 9: T2 · axial · 5.0mm · 0.75mm/px · z∈[-111,+36]mm · 2 of 28 slices shown (1 of 2)]
[im 1/28]
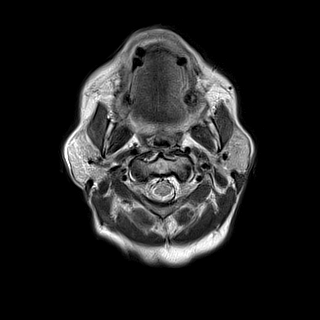
[im 28/28]
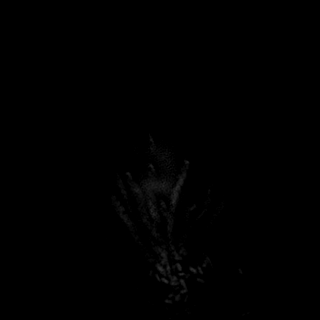

[Series 10: T1 · sagittal · 5.0mm · 0.94mm/px · 2 of 25 slices shown]
[im 1/25]
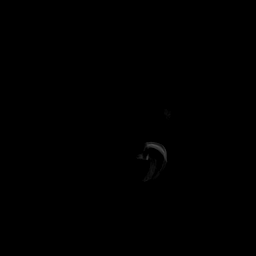
[im 25/25]
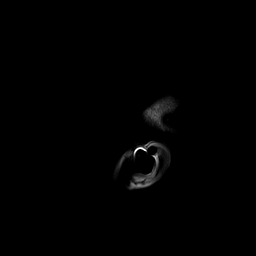

[Series 11: FLAIR · axial · 5.0mm · 0.94mm/px · z∈[-111,+36]mm · 2 of 28 slices shown]
[im 1/28]
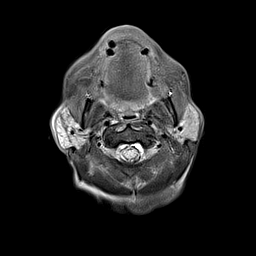
[im 28/28]
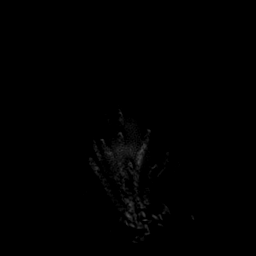

[Series 12: mag_images · axial · 3.0mm · 0.94mm/px · z∈[-112,+37]mm · 4 of 56 slices shown]
[im 1/56]
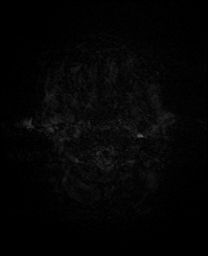
[im 19/56]
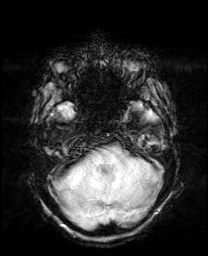
[im 37/56]
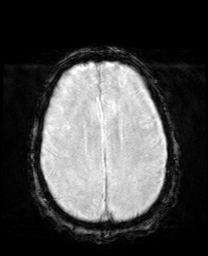
[im 56/56]
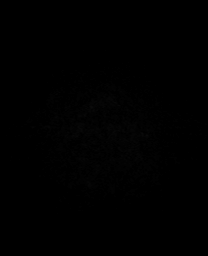

[Series 13: pha_images · axial · 3.0mm · 0.94mm/px · z∈[-112,+28]mm · 4 of 53 slices shown]
[im 1/53]
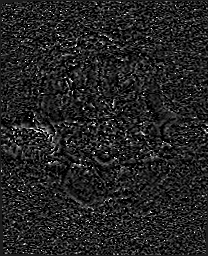
[im 18/53]
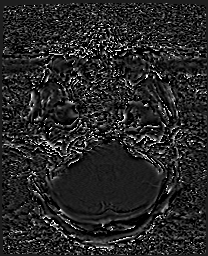
[im 35/53]
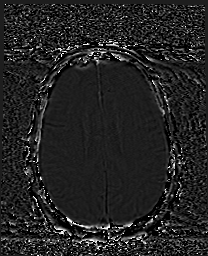
[im 53/53]
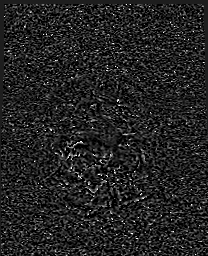

[Series 14: swi_images · axial · 3.0mm · 0.94mm/px · z∈[-112,+37]mm · 4 of 56 slices shown]
[im 1/56]
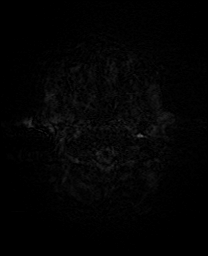
[im 19/56]
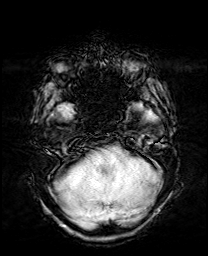
[im 37/56]
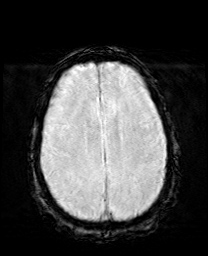
[im 56/56]
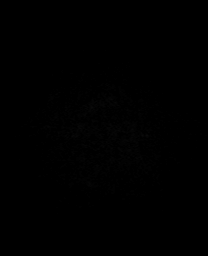

[Series 15: mip_images(sw) · axial · 24.0mm · 0.94mm/px · z∈[-103,+27]mm · 4 of 49 slices shown]
[im 1/49]
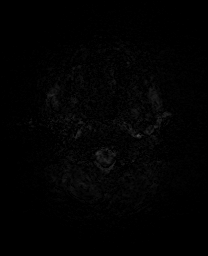
[im 17/49]
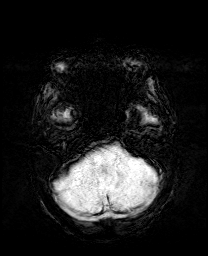
[im 33/49]
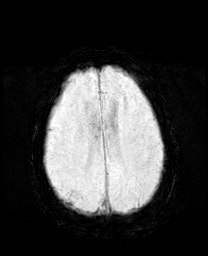
[im 49/49]
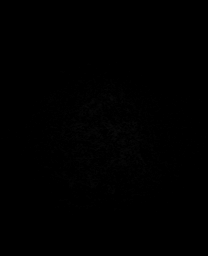

[Series 17: T2 · coronal · 5.0mm · 0.72mm/px · 2 of 34 slices shown (2 of 2)]
[im 1/34]
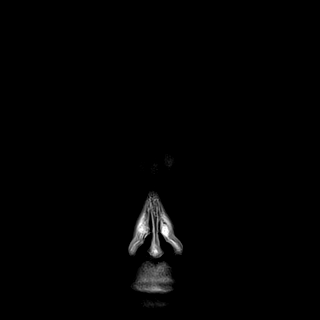
[im 34/34]
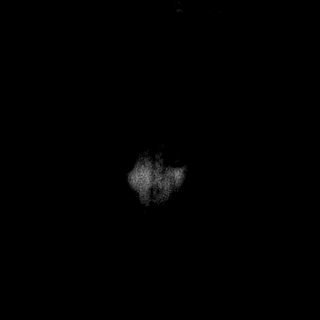

[44 of 48 positions shown; findings below may reference images not displayed]

FINDINGS: Brain: Punctate focus of subcortical white matter restricted
diffusion in the left superior frontal gyrus, perirolandic or pre
motor area series 5, image 99. Associated punctate T2 and FLAIR
hyperintensity there.

No other restricted diffusion. No midline shift, mass effect,
evidence of mass lesion, ventriculomegaly, extra-axial collection or
acute intracranial hemorrhage. Cervicomedullary junction and
pituitary are within normal limits.

Cerebral volume is within normal limits for age.

Additional scattered small nonspecific foci of bilateral mostly
frontal lobe white matter T2 and FLAIR hyperintensity (series 11,
image 20). No cortical encephalomalacia or chronic cerebral blood
products identified. Deep gray matter nuclei, brainstem, and
cerebellum appear negative.

Vascular: Major intracranial vascular flow voids are preserved.

Skull and upper cervical spine: Negative visible cervical spine.
Visualized bone marrow signal is within normal limits.

Sinuses/Orbits: Orbit motion artifact, postoperative changes to the
right globe. Moderate bilateral paranasal sinus mucosal thickening
most pronounced in the maxillary sinuses.

Other: Mastoids are clear. Grossly normal visible internal auditory
structures. Visible scalp and face appear negative.
IMPRESSION: 1. Punctate acute subcortical white matter infarct in the left
superior frontal gyrus; perirolandic or pre-motor area. No
associated hemorrhage or mass effect.

2. No other acute intracranial abnormality. other mildly advanced
for age nonspecific bilateral frontal lobe white matter signal
changes.

3. Paranasal sinus inflammation.

## 2021-11-22 MED ORDER — GABAPENTIN 300 MG PO CAPS: 300.0000 mg | ORAL_CAPSULE | Freq: Every day | ORAL | Status: DC

## 2021-11-22 MED ORDER — SUCRALFATE 1 G PO TABS
1.0000 g | ORAL_TABLET | Freq: Three times a day (TID) | ORAL | Status: DC
  Administered 2021-11-22 – 2021-11-23 (×4): 1 g via ORAL
  Filled 2021-11-22 (×8): qty 1

## 2021-11-22 MED ORDER — PANTOPRAZOLE SODIUM 40 MG PO TBEC
40.0000 mg | DELAYED_RELEASE_TABLET | Freq: Every day | ORAL | Status: DC
  Administered 2021-11-22 – 2021-11-23 (×2): 40 mg via ORAL
  Filled 2021-11-22: qty 1

## 2021-11-22 MED ORDER — ROSUVASTATIN CALCIUM 20 MG PO TABS: 20.0000 mg | ORAL_TABLET | Freq: Every day | ORAL | Status: DC

## 2021-11-22 MED ORDER — ROSUVASTATIN CALCIUM 20 MG PO TABS
40.0000 mg | ORAL_TABLET | Freq: Every day | ORAL | Status: DC
  Administered 2021-11-23: 40 mg via ORAL
  Filled 2021-11-22: qty 2

## 2021-11-22 MED ORDER — ASPIRIN EC 81 MG PO TBEC
81.0000 mg | DELAYED_RELEASE_TABLET | Freq: Every day | ORAL | Status: DC
  Administered 2021-11-22 – 2021-11-23 (×2): 81 mg via ORAL
  Filled 2021-11-22 (×2): qty 1

## 2021-11-22 MED ORDER — LIDOCAINE 5 % EX PTCH
1.0000 | MEDICATED_PATCH | CUTANEOUS | Status: DC
  Administered 2021-11-22: 1 via TRANSDERMAL
  Filled 2021-11-22: qty 1

## 2021-11-22 NOTE — Progress Notes (Signed)
? ?Subjective: NAEON ? ?This AM, patient reports significant abdominal pain extending linearly from her sternum down to her umbilicus.  She says that the pain started in her chest and radiated downward.  She describes this pain as a burning sensation, as though "alcohol is poured on an open wound." Patient reports diarrhea, with last BM last night. RN present in the room advised that no blood was present when she was cleaned. She denies dyspnea, HA, dysuria, or hematuria. ? ?Objective: ? ?Vital signs in last 24 hours: ?Vitals:  ? 11/22/21 0635 11/22/21 0727 11/22/21 0916 11/22/21 1054  ?BP: (!) 150/65 (!) 153/84 (!) 165/93 (!) 191/90  ?Pulse: 90 86  99  ?Resp: '17 13  14  ' ?Temp:  98.3 ?F (36.8 ?C)  98.4 ?F (36.9 ?C)  ?TempSrc:  Oral  Oral  ?SpO2: 100% 96%  98%  ?Weight:      ?Height:      ? ?Physical Exam: ?General: thin elderly-appearing woman lying in bed in NAD ?HENT: normocephalic, atraumatic, external nares and ears appear normal; poor dentition with missing teeth ?EYES: conjunctiva non-erythematous, no scleral icterus ?CV: regular rate, normal rhythm, no murmurs, rubs, gallops. Chest mildly tender to palpation (Patient without flinching). ?Pulmonary: normal work of breathing on RA, lungs clear to auscultation bilaterally ?Abdominal: non-distended, soft, severely tender to palpation in the distribution of surgical scar; mild tenderness of RUQ and RLQ (more consistent with referred pain). No suprapubic tenderness, normal BS all 4 quadrants ?Skin: Warm and dry ?Neurological: Awake, alert and oriented x3. Biceps strength 4/5 bilaterally, Triceps: 3/5 on RUE, 4/5 LUE. Bilateral LE strength 3/5.  ?Psych: normal affect and behavior  ? ?Assessment/Plan: ?Amber Cross is a 61 year old female with PMHx significant for hypertension, tobacco use disorder, HFpEF, and recent admission to [facility] for presumed MSK chest pain who presented with epigastric pain, hematochezia, and subjective left-sided weakness and  numbness. ? ?Principal Problem: ?  Severe hypertension ? ?#Severe hypertension ?#Chest pain ?#Acute punctate infarct ?4/27 SBP: 153-191/65-99.  Work-up so far is unremarkable for ACS: troponin 34->35, likely reflecting demand ischemia, and her EKG shows J-point elevation in V2 and V3 but is otherwise unremarkable. CT head negative, MRI brain with punctate acute subcortical white matter infarct left superior frontal gyrus.  Stroke work-up largely completed during previous admission: A1c 9.6, LDL 86, TSH 1.99, echo with LVEF 55 to 60%, normal LV function, moderately dilated LA.  Neurology consulted, appreciate recs. ?-We will slowly decrease BP in the setting of acute infarct ?-Continue home losartan 100 mg p.o. daily ?-Continue amlodipine 5 mg daily ?-Home Coreg held. ?-Telemetry ?-MRA head to assess for vasculature abnormalities. ?  ?#Epigastric pain ?On physical exam, pain primarily in the distribution of previous surgical scar, in which she had a hernia repair; worsened with palpation.  Pain into chest, RUQ, and RLQ consistent with referred pain, mildly tender to palpation. Work-up is significant for alk phos elevation to 141;RUQ U/S unremarkable. Lipase is mildly elevated at 65. GGT 28.  Pain most likely of MSK etiology as reproducible with palpation. ?-Continue Protonix 40 mg daily ?- Add sucralfate 1 g 3 times daily with meals and at bedtime ?- Add lidocaine patch every 24 hours ? ?#Generalized weakness ?#Recurrent falls ?Patient uses a walker at baseline, reports multiple falls recently.  She states she has had weakness in her bilateral lower extremities.  She denies head trauma.   ?- Per neurology, MRI C and T-spine to assess for source of bowel incontinence and arm weakness ? ?  ?#  CKD stage IIIb ?#Hypokalemia ?Baseline creatinine ~ 1.7. Cr today of 1.67.  ?-Avoid nephrotoxic medications ?-Trend BMP ?-Repleting 40 mEQ x2 ?- f/u potassium ? ?#Normocytic anemia ?#Hematochezia, resolved ?Patient endorses  hematochezia which started over the weekend; RN reports no hematochezia overnight.  Hgb this a.m. 7.2.  Also with recent iron studies suggestive of IDA: Iron 15, TIBC 182, ferritin 395. ?- f/u evening CBC ?-Trend a.m. CBC ?-Transfuse for Hgb <7 ?  ?#UA Abnormalities ?UA with glucosuria (chronic), small hemoglobin, significant proteinuria, trace leukocytes, greater than 50 white blood cells, and many bacteria.  Patient denies any urinary symptoms. ?-We will continue to monitor at this time ? ?#HFpEF ?Echocardiogram last hospitalization with EF 55-60%.  BNP 335.2 , decreased from 6 months ago.  No edema appreciated in BLE; does not appear volume overloaded.  Chest x-ray is also unremarkable for any acute cardiopulmonary processes, lungs CTAB. ?-Daily weights ?-Strict I/O's ?  ?#Diabetes mellitus type 2, insulin-dependent, uncontrolled with hyperglycemia ?Most recent A1c of 9.6 about 2 weeks ago.  Home regimen: SSI.  Since glucose of 596 on admission, followed by 10 units NovoLog, subsequent CBGs 147-279.  ?-Start Semglee 10 units nightly ?-SSI with meals ?-CBG monitoring ?  ? ?#Hyperlipidemia ?-Continue home crestor 10 mg ? ?Prior to Admission Living Arrangement:Home ?Anticipated Discharge Location: Pending PT/OT recs ?Barriers to Discharge: Clinical improvement ?Dispo: Anticipated discharge in approximately 2-3 day(s).  ? Rosezetta Schlatter, MD ?11/22/2021, 3:02 PM ?Pager: [phone] ?After 5pm on weekdays and 1pm on weekends: On Call pager [phone]  ?

## 2021-11-22 NOTE — Progress Notes (Signed)
Attempted to bring pt down for MRI. Per RN, pt does not want to come down at this time.TBD at a later time.  ?

## 2021-11-22 NOTE — Progress Notes (Signed)
Initial Nutrition Assessment ? ?DOCUMENTATION CODES:  ? ?Non-severe (moderate) malnutrition in context of chronic illness ? ?INTERVENTION:  ? ?- Ensure Enlive po TID, each supplement provides 350 kcal and 20 grams of protein ? ?- Magic Cup BID with lunch and dinner meals, each supplement provides 290 kcal and 9 grams of protein ? ?- Renal MVI daily ? ?- Encourage PO intake ? ?NUTRITION DIAGNOSIS:  ? ?Moderate Malnutrition related to chronic illness (CHF, CKD) as evidenced by moderate fat depletion, severe muscle depletion. ? ?GOAL:  ? ?Patient will meet greater than or equal to 90% of their needs ? ?MONITOR:  ? ?PO intake, Supplement acceptance, Labs, Weight trends, I & O's ? ?REASON FOR ASSESSMENT:  ? ?Consult ?Assessment of nutrition requirement/status ? ?ASSESSMENT:  ? ?61 year old female who presented to the ED from PCP visit on 4/26 with chest pain and SOB. PMH of T2DM, HTN, HLD, tobacco abuse, anxiety, CHF, CKD stage IIIb. Pt admitted with severe HTN, possible acute stroke seen on MRI. ? ?Spoke with pt at bedside. Pt reports chest/abdominal pain and requesting pain medication. RN is aware. ? ?Pt reports appetite is fair at baseline. She states that she is never very hungry but eats pretty well at home. Pt typically eats 2 meals daily. She does her own cooking. A meal may consist of baked or fried chicken with collards and mac and cheese OR rice and beans. Pt requesting an oral nutrition supplement. She does not consume these at home but had them last time she was admitted to the hospital. ? ?Pt reports eating poorly during admission because she does not like the food here. RD will try to optimize PO intake with supplements including Ensure and Magic Cup. Will also order renal MVI. Pt in agreement with plan. ? ?Pt reports UBW is 118 lbs. She states that recently she gained weight up to 123 lbs due to fluid. Reviewed weight history in chart. Pt's weight has fluctuated over the last 6 months between 53-57 kg.  Current weight is 56.2 kg. ? ?Based on NFPE, pt meets criteria for moderate chronic malnutrition. RD provided pt with a vanilla Ensure at time of visit. ? ?Admit weight: 56.2 kg ?Current weight: 55.3 kg ? ?Medications reviewed and include: SSI TID with meals, semglee 10 units daily, remeron 15 mg daily, protonix, klor-con 40 mEq once, sucralfate 1 gram QID ? ?Labs reviewed: potassium 3.2, BUN 23, creatinine 1.97, hemoglobin 7.3 ?CBG's: 112-283 x 24 hours ? ?UOP: 950 ml x 24 hours ?I/O's: -640 ml since admit ? ?NUTRITION - FOCUSED PHYSICAL EXAM: ? ?Flowsheet Row Most Recent Value  ?Orbital Region Moderate depletion  ?Upper Arm Region Moderate depletion  ?Thoracic and Lumbar Region Moderate depletion  ?Buccal Region Moderate depletion  ?Temple Region Moderate depletion  ?Clavicle Bone Region Severe depletion  ?Clavicle and Acromion Bone Region Severe depletion  ?Scapular Bone Region Moderate depletion  ?Dorsal Hand Moderate depletion  ?Patellar Region Moderate depletion  ?Anterior Thigh Region Severe depletion  ?Posterior Calf Region Severe depletion  ?Edema (RD Assessment) None  ?Hair Reviewed  ?Eyes Reviewed  ?Mouth Reviewed  ?Skin Reviewed  ?Nails Reviewed  ? ?  ? ? ?Diet Order:   ?Diet Order   ? ?       ?  Diet Carb Modified Fluid consistency: Thin; Room service appropriate? Yes  Diet effective now       ?  ? ?  ?  ? ?  ? ? ?EDUCATION NEEDS:  ? ?Education needs have been addressed ? ?  Skin:  Skin Assessment: Reviewed RN Assessment ? ?Last BM:  11/22/21 multiple type 6 ? ?Height:  ? ?Ht Readings from Last 1 Encounters:  ?11/22/21 _0  (1.702 m)  ? ? ?Weight:  ? ?Wt Readings from Last 1 Encounters:  ?11/22/21 55.3 kg  ? ? ?BMI:  Body mass index is 19.09 kg/m?. ? ?Estimated Nutritional Needs:  ? ?Kcal:  1700-1900 ? ?Protein:  80-90 grams ? ?Fluid:  1.7-1.9 L ? ? ? ?Gustavus Bryant, MS, RD, LDN ?Inpatient Clinical Dietitian ?Please see AMiON for contact information. ? ?

## 2021-11-22 NOTE — Plan of Care (Signed)

## 2021-11-22 NOTE — Consult Note (Addendum)
NEUROLOGY CONSULTATION NOTE  ? ?Date of service: November 22, 2021 ?Patient Name: Amber Cross ?MRN:  [id number] ?DOB:  [birth year] ?Reason for consult: "generalized weakness, left arm weakness, incidental punctate infarct" ?Requesting Provider: Sid Falcon, MD ? ?History of Present Illness  ?Amber Cross is a 61 y.o. 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  ? ?No family at bedside. ? ?Fell in her kitchen sometime last week after pivoting where her legs collapsed with associated dizziness and SOB. She landed on all 4s and was able to pull herself up soon after. She had resulting left arm pain and weakness. Denied hitting her head, LOC, N/V, headaches, palpitations, blurry vision, diplopia, hitting her shoulder.  ?She had 2 more falls, although could not remember exactly when.  ? ?She has had generalized weakness since Feb 2023, which was when her "health went downhill for unknown reasons". She declined to further explain circumstances during this time. Reported that she felt to weak to get up out of bed to attend work in Feb and has not worked since.  ? ?During evaluation, she had bowel incontinence and did not feel the urge. Also reported having urinary incontinence too, but is able to feel bladder distension and urge to go. Denied saddle paresthesia.  ? ?Patient still has left arm weakness, that has remained the same, but her generalized weakness has worsened. Her biggest concern today is her low energy level.  ? 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 She denied falls, traumatic events, or spine procedures prior. Reported bowel incontinence daily since, applied for disabilities on Sep 09, 2021. Son's girlfriend will not  let patient see her grandchildren, last grandchildren visit Jan 2023. Since then, fights with her son more, insomnia, decreased appetite, anhedonia, decreased nrg, concentration issues. Feeling guilty, worthless, hopeless. Denied SI/HI/AVH ? ?ROS  ? ?HEENT Denies changes in vision  ?Respiratory SOB   ?CV palpitations and CP   ?GI Denies abdominal pain, nausea, vomiting. Diarrhea  ?GU Denies dysuria. Urinary frequency.   ?MSK Denies myalgia and joint pain.   ?Skin Denies rash and pruritus.   ?Neurological Denies headache and syncope.   ?Psychiatric Low energy, sad mood  ? ?Past History  ? ?Past Medical History:  ?Diagnosis Date  ? Diabetes mellitus   ? Hypercholesteremia   ? Hypertension   ? Tobacco abuse   ? ?Past Surgical History:  ?Procedure Laterality Date  ? ANTERIOR CRUCIATE LIGAMENT REPAIR Right   ? CATARACT EXTRACTION    ? OVARY SURGERY    ? RIGHT OOPHORECTOMY Right   ? SMALL INTESTINE SURGERY    ? TUBAL LIGATION    ? ?Family History  ?Problem Relation Age of Onset  ? Diabetes Mother   ? Hypertension Mother   ? Hypertension Father   ? ?Social History  ? ?Socioeconomic History  ? Marital status: Married  ?  Spouse name: Not on file  ? Number of children: Not on file  ? Years of education: Not on file  ? Highest education level: Not on file  ?Occupational History  ? Not on file  ?Tobacco Use  ? Smoking status: Every Day  ?  Packs/day: 0.50  ?  Years: 40.00  ?  Pack years: 20.00  ?  Types: Cigarettes  ? Smokeless tobacco: Never  ?Vaping Use  ? Vaping Use: Never used  ?Substance and Sexual Activity  ? Alcohol use: Not Currently  ?  Alcohol/week: 1.0 standard drink  ?  Types: 1 Glasses of wine per week  ? Drug use: No  ? Sexual activity: Not Currently  ?  Birth control/protection: Abstinence  ?Other Topics Concern  ? Not on file  ?Social History Narrative  ? Not on file  ? ?Social Determinants of Health  ? ?Financial Resource Strain: Not on file  ?Food Insecurity: Not on file  ?Transportation Needs: Not on file   ?Physical Activity: Not on file  ?Stress: Not on file  ?Social Connections: Not on file  ? ?Allergies  ?Allergen Reactions  ? Penicillins   ?  itch  ? Strawberry (Diagnostic) Itching  ? Tomato Itching  ? ? ?Medications  ? ?Medications Prior to Admission  ?Medication Sig Dispense Refill Last Dose  ? acetaminophen (TYLENOL) 500 MG tablet Take 1,000 mg by mouth every 6 (six) hours as needed for mild pain.   unknown  ? albuterol (VENTOLIN HFA) 108 (90 Base) MCG/ACT inhaler Inhale 2 puffs into the lungs every 6 (six) hours as needed for wheezing or shortness of breath.   unknown  ? amLODipine (NORVASC) 5 MG tablet Take 1 tablet (5 mg total) by mouth daily. 30 tablet 1 11/20/2021  ? busPIRone (BUSPAR) 5 MG tablet Take 5 mg by mouth 2 (two) times daily.   11/20/2021  ? carvedilol (COREG) 25 MG tablet Take 50 mg by mouth 2 (two) times daily with a meal.   11/20/2021 at 0900  ? Cholecalciferol (VITAMIN D) 50 MCG (2000 UT) tablet Take 2,000 Units by mouth daily.   11/20/2021  ? DULoxetine (CYMBALTA) 30 MG capsule Take 30 mg by mouth daily.   11/20/2021  ? furosemide (LASIX) 40 MG tablet Take 40 mg by mouth daily as needed for edema or fluid (swelling).   unknown  ? gabapentin (NEURONTIN) 300 MG capsule Take 300 mg by mouth 3 (three) times daily.   11/20/2021  ? insulin isophane & regular human KwikPen (NOVOLIN 70/30 KWIKPEN) (70-30) 100 UNIT/ML KwikPen Inject 4 Units into the skin 2 (two) times daily before lunch and supper. (Patient taking differently: Inject 10 Units into the skin 2 (two) times daily before lunch and supper.) 2.4 mL 0 11/21/2021  ? losartan (COZAAR) 100 MG tablet Take 100 mg by mouth daily.   11/20/2021  ? mirtazapine (REMERON) 15 MG tablet Take 15 mg by mouth at bedtime.   11/20/2021  ? Multiple Vitamin (MULTI-VITAMIN) tablet Take 1 tablet by mouth daily.   11/20/2021  ? nitroGLYCERIN (NITROSTAT) 0.4 MG SL tablet Place 0.4 mg under the tongue every 5 (five) minutes as needed for chest pain.   unknown  ?  rosuvastatin (CRESTOR) 20 MG tablet Take 10 mg by mouth daily.   11/20/2021  ? Bayer Microlet Lancets lancets Check blood sugar up to 3 times a day as instructed 100 each 12   ? carvedilol (COREG) 12.5 MG tablet Take 1 tablet (12.5 mg total) by mouth 2 (two) times daily with a meal. (Patient taking differently: Take 50 mg by mouth 2 (two) times daily with a meal.) 60 tablet 0   ? gabapentin (NEURONTIN) 300 MG capsule Take 3 capsules (900 mg total) by mouth 3 (three) times daily. (Patient taking differently: Take 300 mg by mouth daily.) 270 capsule 0   ? glucose blood test strip Check  blood sugar up to 3 times a day as instructed 100 each 12   ? oxyCODONE-acetaminophen (PERCOCET/ROXICET) 5-325 MG tablet Take 1 tablet by mouth every 6 (six) hours as needed for severe pain. (Patient not taking: Reported on 11/21/2021) 12 tablet 0 Completed Course  ? polyethylene glycol (MIRALAX / GLYCOLAX) 17 g packet Take 17 g by mouth daily. (Patient not taking: Reported on 11/21/2021) 14 each 0 Not Taking  ? senna-docusate (SENOKOT-S) 8.6-50 MG tablet Take 1 tablet by mouth at bedtime as needed for mild constipation. (Patient not taking: Reported on 11/07/2021) 14 tablet 0 Completed Course  ?  ? ?Vitals  ? ?Vitals:  ? 11/22/21 0727 11/22/21 0916 11/22/21 1054 11/22/21 1625  ?BP: (!) 153/84 (!) 165/93 (!) 191/90 (!) 174/87  ?Pulse: 86  99 98  ?Resp: 13  14 20   ?Temp: 98.3 ?F (36.8 ?C)  98.4 ?F (36.9 ?C) 98.4 ?F (36.9 ?C)  ?TempSrc: Oral  Oral Oral  ?SpO2: 96%  98% 96%  ?Weight:      ?Height:      ?  ? ?Body mass index is 19.09 kg/m?. ? ?Physical Exam  ?General: NAD, thin appearing ?HEENT: Poor dentition, missing teeth. ?Pulmonary: Symmetric chest rise. Non-labored respiratory effort.  ?Ext: No cyanosis, edema, or deformity  ?Musculoskeletal: Normal digits and nails by inspection. No clubbing.  ? ?Neurologic Examination  ?Mental status/Cognition:  ?Alert, attentive, engaged with evaluation. ?Oriented to self, place, month, year,  president ?Attention - month of the year backwards ?Speech/language:  ?Speech clear, coherent, slowed. Thought content appropriate ?Comprehension intact-able to follow 3 step commands without difficulty.  ?Object naming and

## 2021-11-22 NOTE — Progress Notes (Signed)
? ?  Inpatient Rehab Admissions Coordinator : ? ?Per therapy recommendations, patient was screened for CIR candidacy by Lawrance Wiedemann RN MSN.  At this time patient appears to be a potential candidate for CIR. I will place a rehab consult per protocol for full assessment. Please call me with any questions. ? ?Alexsandro Salek RN MSN ?Admissions Coordinator ?[phone] ?  ?

## 2021-11-22 NOTE — Plan of Care (Signed)
  Problem: Education: Goal: Knowledge of General Education information will improve Description: Including pain rating scale, medication(s)/side effects and non-pharmacologic comfort measures Outcome: Progressing   Problem: Health Behavior/Discharge Planning: Goal: Ability to manage health-related needs will improve Outcome: Progressing   Problem: Clinical Measurements: Goal: Ability to maintain clinical measurements within normal limits will improve Outcome: Progressing   Problem: Coping: Goal: Level of anxiety will decrease Outcome: Progressing   Problem: Elimination: Goal: Will not experience complications related to urinary retention Outcome: Progressing   Problem: Pain Managment: Goal: General experience of comfort will improve Outcome: Progressing   Problem: Safety: Goal: Ability to remain free from injury will improve Outcome: Progressing   Problem: Skin Integrity: Goal: Risk for impaired skin integrity will decrease Outcome: Progressing   

## 2021-11-22 NOTE — TOC Progression Note (Signed)
Transition of Care (TOC) - Progression Note  ? ? ?Patient Details  ?Name: Amber Cross ?MRN: [id number] ?Date of Birth: May 08, 1961 ? ?Transition of Care (TOC) CM/SW Contact  ?Angelita Ingles, RN ?Phone Number:[phone] ? ?11/22/2021, 1:53 PM ? ?Clinical Narrative:    ? ?Transition of Care (TOC) Screening Note ? ? ?Patient Details  ?Name: Amber Cross ?Date of Birth: Apr 30, 1961 ? ? ?Transition of Care (TOC) CM/SW Contact:    ?Angelita Ingles, RN ?Phone Number: ?11/22/2021, 1:53 PM ? ? ? ?Transition of Care Department [facility]) has reviewed patient and no TOC needs have been identified at this time. We will continue to monitor patient advancement through interdisciplinary progression rounds. If new patient transition needs arise, please place a TOC consult. ? ? ? ? ?  ?  ? ?Expected Discharge Plan and Services ?  ?  ?  ?  ?  ?                ?  ?  ?  ?  ?  ?  ?  ?  ?  ?  ? ? ?Social Determinants of Health (SDOH) Interventions ?  ? ?Readmission Risk Interventions ?   ? View : No data to display.  ?  ?  ?  ? ? ?

## 2021-11-22 NOTE — Evaluation (Addendum)
Physical Therapy Evaluation ?Patient Details ?Name: Amber Cross ?MRN: [id number] ?DOB: September 24, 1960 ?Today's Date: 11/22/2021 ? ?History of Present Illness ? Pt is a 61 y.o. female who presented 11/21/21 with chest and epigastric/RUQ pain, SOB, hematochezia, left-sided weakness and numbness. Work-up so far is not remarkable for ACS. 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  PMHx of T2DM, HTN, HLD, tobacco abuse, anxiety, HFpEF (EF 55-60% in April 2023), CKD stage IIIb, and a recent admission at [facility] for noncardiac chest pain thought to be of MSK etiology ?  ?Clinical Impression ? Pt presents with condition above and deficits mentioned below, see PT Problem List. PTA, she was mod I using a rollator, living with her adult son (who works away from home) in a 1-level house with 3-4 STE. Pt reports she can live with her sister (who works from home) if needed. Currently, pt displays generalized weakness and incoordination bil, but the L side displaying increased deficits compared to the R. Pt with noted dysmetria and ataxic movements. Due to these mentioned deficits, she has impaired balance and required minA for transfers and to ambulate bedroom distance gait bouts with a RW with an ataxic gait pattern and knee buckling noted. Pt is at high risk for falls. Considering her age, motivation to improve, and drastic change in functional status, recommending intensive therapy in the AIR setting to maximize her safety and independence with all functional mobility. Will continue to follow acutely.   ?   ? ?Recommendations for follow up therapy are one component of a multi-disciplinary discharge planning process, led by the attending physician.  Recommendations may be updated based on patient status, additional functional criteria and insurance authorization. ? ?Follow Up Recommendations Acute inpatient rehab (3hours/day) ? ?  ?Assistance  Recommended at Discharge Frequent or constant Supervision/Assistance  ?Patient can return home with the following ? A little help with walking and/or transfers;A little help with bathing/dressing/bathroom;Assistance with cooking/housework;Assist for transportation;Help with stairs or ramp for entrance ? ?  ?Equipment Recommendations Rolling walker (2 wheels);BSC/3in1;Wheelchair (measurements PT);Wheelchair cushion (measurements PT) (pending progress)  ?Recommendations for Other Services ? Rehab consult  ?  ?Functional Status Assessment Patient has had a recent decline in their functional status and demonstrates the ability to make significant improvements in function in a reasonable and predictable amount of time.  ? ?  ?Precautions / Restrictions Precautions ?Precautions: Fall;Other (comment) ?Precaution Comments: ataxic ?Restrictions ?Weight Bearing Restrictions: No  ? ?  ? ?Mobility ? Bed Mobility ?Overal bed mobility: Needs Assistance ?Bed Mobility: Supine to Sit, Sit to Supine ?  ?  ?Supine to sit: Min guard, HOB elevated ?Sit to supine: Min guard, HOB elevated ?  ?General bed mobility comments: Min guard for safety ?  ? ?Transfers ?Overall transfer level: Needs assistance ?Equipment used: Rolling walker (2 wheels) ?Transfers: Sit to/from Stand ?Sit to Stand: Min assist ?  ?  ?  ?  ?  ?General transfer comment: MinA to power up to stand and steady with noted instability and knee buckling intermittently. ?  ? ?Ambulation/Gait ?Ambulation/Gait assistance: Min assist ?Gait Distance (Feet): 20 Feet ?Assistive device: Rolling walker (2 wheels) ?Gait Pattern/deviations: Step-through pattern, Decreased stride length, Knees buckling, Trunk flexed, Ataxic ?Gait velocity: reduced ?Gait velocity interpretation: <1.31 ft/sec, indicative of household ambulator ?  ?General Gait Details: Pt with slow, short steps with bil knee buckling intermittently and ataxia noted. MinA for stability throughout with taking steps anterior  <>  posterior at EOB. ? ?Stairs ?  ?  ?  ?  ?  ? ?Wheelchair Mobility ?  ? ?Modified Rankin (Stroke Patients Only) ?Modified Rankin (Stroke Patients Only) ?Pre-Morbid Rankin Score: Slight disability ?Modified Rankin: Moderately severe disability ? ?  ? ?Balance Overall balance assessment: Needs assistance ?Sitting-balance support: No upper extremity supported, Feet supported, Bilateral upper extremity supported ?Sitting balance-Leahy Scale: Poor ?Sitting balance - Comments: Truncal ataxia noted, causing LOB with dynamic tasks, minA to recover. Min guard assist with UE support for static sitting. ?  ?Standing balance support: Bilateral upper extremity supported, During functional activity, Reliant on assistive device for balance ?Standing balance-Leahy Scale: Poor ?Standing balance comment: Reliant on RW and minA ?  ?  ?  ?  ?  ?  ?  ?  ?  ?  ?  ?   ? ? ? ?Pertinent Vitals/Pain Pain Assessment ?Pain Assessment: Faces ?Faces Pain Scale: Hurts a little bit ?Pain Location: bil calves ?Pain Descriptors / Indicators: Discomfort ?Pain Intervention(s): Monitored during session, Limited activity within patient's tolerance, Repositioned, Other (comment) (notified RN)  ? ? ?Home Living Family/patient expects to be discharged to:: Private residence ?Living Arrangements: Children (89 y.o. son (gone to work during day)) ?Available Help at Discharge: Family;Available 24 hours/day (reports can go live with her sister who works from home if needed) ?Type of Home: House ?Home Access: Stairs to enter ?Entrance Stairs-Rails: Can reach both ?Entrance Stairs-Number of Steps: 3-4 ?  ?Home Layout: One level ?Home Equipment: Rollator (4 wheels) ?   ?  ?Prior Function Prior Level of Function : Independent/Modified Independent;Driving ?  ?  ?  ?  ?  ?  ?Mobility Comments: Uses rollator for mobility. Denies any falls, but chart review indicates several falls in past week. ?ADLs Comments: Stands for showers. No assistance needed for ADLs,  cleaning, cooking, meds/financial management etc ?  ? ? ?Hand Dominance  ? Dominant Hand: Right ? ?  ?Extremity/Trunk Assessment  ? Upper Extremity Assessment ?Upper Extremity Assessment: Defer to OT evaluation (incoordination noted bil (L>R), slight weakness noted L UE compared to R UE; reported decreased sensation/tingling throughout L UE and intact sensation R UE) ?  ? ?Lower Extremity Assessment ?Lower Extremity Assessment: RLE deficits/detail;LLE deficits/detail ?RLE Deficits / Details: MMT scores of 4 hip flexion, 5 knee extension, 4 ankle dorsiflexion; reports intact and normal sensation to touch throughout; incoordination, dysmetria noted ?RLE Sensation: WNL ?RLE Coordination: decreased fine motor;decreased gross motor ?LLE Deficits / Details: MMT scores of 4- hip flexion, 4 knee extension, 4+ ankle dorsiflexion; reports numbness/needles with touch throughout; incoordination, dysmetria noted (L>R) ?LLE Sensation: decreased light touch ?LLE Coordination: decreased fine motor;decreased gross motor ?  ? ?Cervical / Trunk Assessment ?Cervical / Trunk Assessment: Normal  ?Communication  ? Communication: No difficulties  ?Cognition Arousal/Alertness: Awake/alert ?Behavior During Therapy: [facility] for tasks assessed/performed ?Overall Cognitive Status: Within Functional Limits for tasks assessed ?  ?  ?  ?  ?  ?  ?  ?  ?  ?  ?  ?  ?  ?  ?  ?  ?General Comments: Flat affect at times, but overall follows commands appropriately. Appears to be aware of her deficits. ?  ?  ? ?  ?General Comments General comments (skin integrity, edema, etc.): VSS on RA ? ?  ?Exercises    ? ?Assessment/Plan  ?  ?PT Assessment Patient needs continued PT services  ?PT Problem List Decreased strength;Decreased activity tolerance;Decreased balance;Decreased mobility;Decreased coordination;Impaired sensation ? ?   ?  ?  PT Treatment Interventions DME instruction;Gait training;Stair training;Functional mobility training;Therapeutic activities;Balance  training;Therapeutic exercise;Neuromuscular re-education;Patient/family education   ? ?PT Goals (Current goals can be found in the Care Plan section)  ?Acute Rehab PT Goals ?Patient Stated Goal: to improve

## 2021-11-22 NOTE — Progress Notes (Addendum)
?  Date: 11/22/2021 ? ?Patient name: Amber Cross  ?Medical record number: [id number]  ?Date of birth: [birth year]  ? ?I have seen and evaluated [name] and discussed their care with the Residency Team. Briefly, Ms. Kolasinski presented with multiple problems, including elevated hypertension, chest pain after being out of antihypertensives, burning chest and epigastric pain, hypokalemia, left arm weakness, uncontrolled DM2.  She was noted this morning to be improved with very mild right arm weakness and generalized weakness.  Burning chest pain persists.  Troponin are flat, EKG showed early repolarization. She had an MRI showing a punctate stroke, but could also be early changes related to hypertension.  Neurology is consulted.  ? ?Vitals:  ? 11/22/21 0916 11/22/21 1054  ?BP: (!) 165/93 (!) 191/90  ?Pulse:  99  ?Resp:  14  ?Temp:  98.4 ?F (36.9 ?C)  ?SpO2:  98%  ? ?Gen: Thin woman, appears older than stated age ?Eyes; Anicteric sclerae ?CV: RR, NR, no murmur noted, no rubs or gallops, + TTP over entire chest wall.  ?Pulm: Breathing comfortably on room air ?Abd: + TTP in the epigastrium and RUQ, no distention or rebound, + voluntary guarding. She has a midline scar from epigastrium to the umbilicus.  ?MSK: Decreased bulk in hands, at temples.   ? ?Assessment and Plan: ?I have seen and evaluated the patient as outlined above. I agree with the formulated Assessment and Plan as detailed in the residents' note, with the following changes:  ? ?Accelerated Severe HTN ?Chest pain ?Possible acute stroke - seen on MRI ?- Resumed home losartan and amlodipine, hold coreg ?- Neurology consult ?- Telemetry ?- TIA/Stroke work up with A1C, Lipid panel if needed ?- Would start a daily aspirin ?- Start pantoprozole and sucralfate for burning pain ?- PT/OT evaluation ? ?Moderate malnutrition (muscle wasting on exam, Alb < 1.5) ?- Would recommend a nutrition consult ? ?We will also be replacing potassium and monitoring blood sugar  with addition of Semglee and SSI.  Please see Dr. Debbrah Alar daily note for more details.  ? ?Sid Falcon, MD ?4/27/202312:29 PM  ?

## 2021-11-23 ENCOUNTER — Inpatient Hospital Stay (HOSPITAL_COMMUNITY): Payer: 59

## 2021-11-23 DIAGNOSIS — I639 Cerebral infarction, unspecified: Secondary | ICD-10-CM

## 2021-11-23 DIAGNOSIS — E1142 Type 2 diabetes mellitus with diabetic polyneuropathy: Secondary | ICD-10-CM

## 2021-11-23 DIAGNOSIS — I63312 Cerebral infarction due to thrombosis of left middle cerebral artery: Secondary | ICD-10-CM

## 2021-11-23 DIAGNOSIS — R159 Full incontinence of feces: Secondary | ICD-10-CM

## 2021-11-23 DIAGNOSIS — I10 Essential (primary) hypertension: Secondary | ICD-10-CM | POA: Diagnosis not present

## 2021-11-23 DIAGNOSIS — E440 Moderate protein-calorie malnutrition: Secondary | ICD-10-CM | POA: Insufficient documentation

## 2021-11-23 LAB — CBC
HCT: 21.7 % — ABNORMAL LOW (ref 36.0–46.0)
Hemoglobin: 7.3 g/dL — ABNORMAL LOW (ref 12.0–15.0)
MCH: 29.1 pg (ref 26.0–34.0)
MCHC: 33.6 g/dL (ref 30.0–36.0)
MCV: 86.5 fL (ref 80.0–100.0)
Platelets: 335 10*3/uL (ref 150–400)
RBC: 2.51 MIL/uL — ABNORMAL LOW (ref 3.87–5.11)
RDW: 14.2 % (ref 11.5–15.5)
WBC: 10.3 10*3/uL (ref 4.0–10.5)
nRBC: 0 % (ref 0.0–0.2)

## 2021-11-23 LAB — COMPREHENSIVE METABOLIC PANEL
ALT: 12 U/L (ref 0–44)
AST: 13 U/L — ABNORMAL LOW (ref 15–41)
Albumin: <1.5 g/dL — ABNORMAL LOW (ref 3.5–5.0)
Alkaline Phosphatase: 78 U/L (ref 38–126)
Anion gap: 6 (ref 5–15)
BUN: 23 mg/dL — ABNORMAL HIGH (ref 6–20)
CO2: 23 mmol/L (ref 22–32)
Calcium: 7.7 mg/dL — ABNORMAL LOW (ref 8.9–10.3)
Chloride: 106 mmol/L (ref 98–111)
Creatinine, Ser: 1.97 mg/dL — ABNORMAL HIGH (ref 0.44–1.00)
GFR, Estimated: 29 mL/min — ABNORMAL LOW (ref >60)
Glucose, Bld: 61 mg/dL — ABNORMAL LOW (ref 70–99)
Potassium: 3.2 mmol/L — ABNORMAL LOW (ref 3.5–5.1)
Sodium: 135 mmol/L (ref 135–145)
Total Bilirubin: 0.3 mg/dL (ref 0.3–1.2)
Total Protein: 4.6 g/dL — ABNORMAL LOW (ref 6.5–8.1)

## 2021-11-23 LAB — GLUCOSE, CAPILLARY
Glucose-Capillary: 112 mg/dL — ABNORMAL HIGH (ref 70–99)
Glucose-Capillary: 109 mg/dL — ABNORMAL HIGH (ref 70–99)

## 2021-11-23 LAB — POTASSIUM: Potassium: 3.9 mmol/L (ref 3.5–5.1)

## 2021-11-23 IMAGING — MR MR THORACIC SPINE W/O CM
4 of 6 series · 17 of 48 positions shown · non-contrast
Comparison: Chest CT [DATE] and [DATE]. Cervical spine
radiographs [DATE].

CLINICAL DATA: Neck and back trauma. Abnormal neuro examination.
History of diabetes and hypertension.

EXAM:
MRI CERVICAL AND THORACIC SPINE WITHOUT CONTRAST
TECHNIQUE: Multiplanar and multiecho pulse sequences of the cervical spine, to
include the craniocervical junction and cervicothoracic junction,
and the thoracic spine, were obtained without intravenous contrast.

[Series 3: T1 · sagittal · 3.0mm · 0.90mm/px · 3 of 12 slices shown (1 of 2)]
[im 1/12]
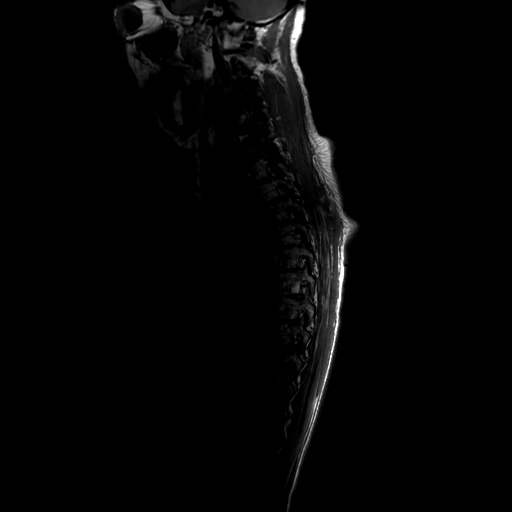
[im 6/12]
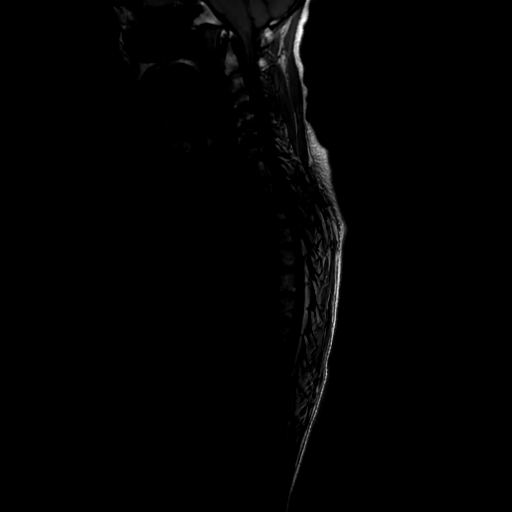
[im 12/12]
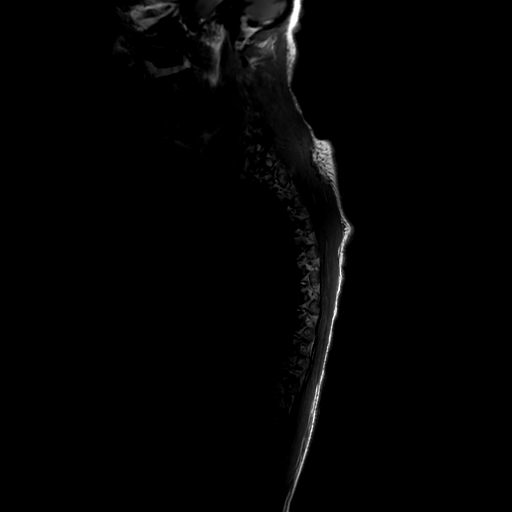

[Series 4: T2 · sagittal · 3.0mm · 0.66mm/px · 5 of 15 slices shown (1 of 2)]
[im 1/15]
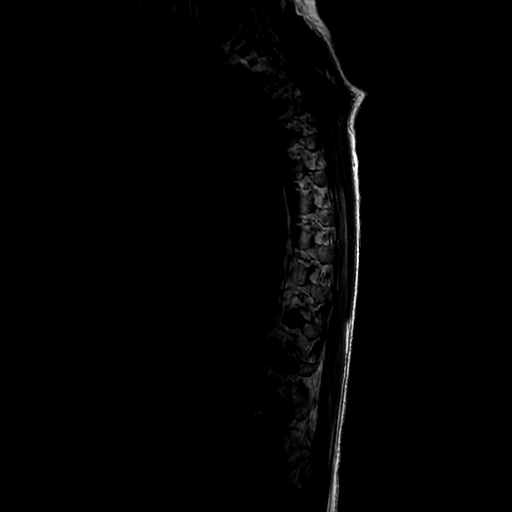
[im 4/15]
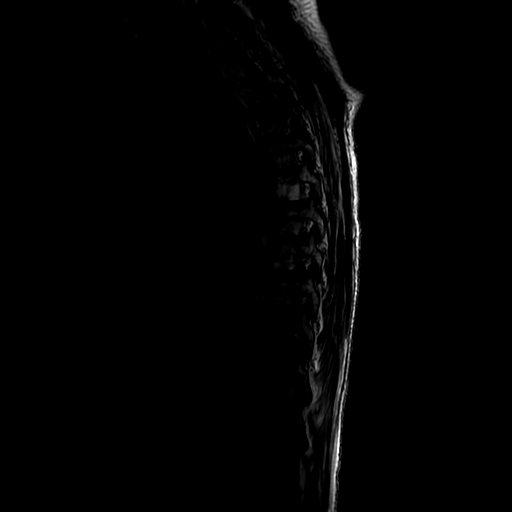
[im 8/15]
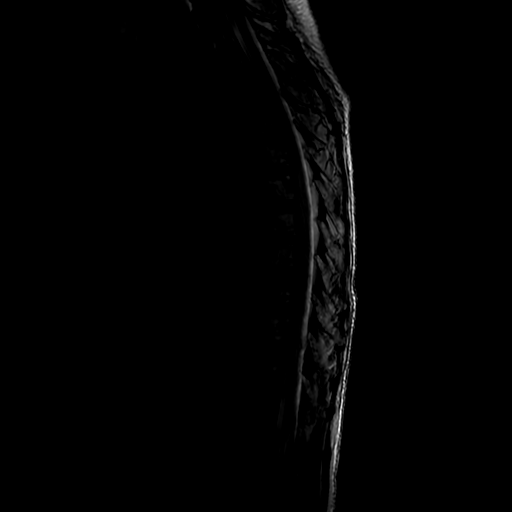
[im 11/15]
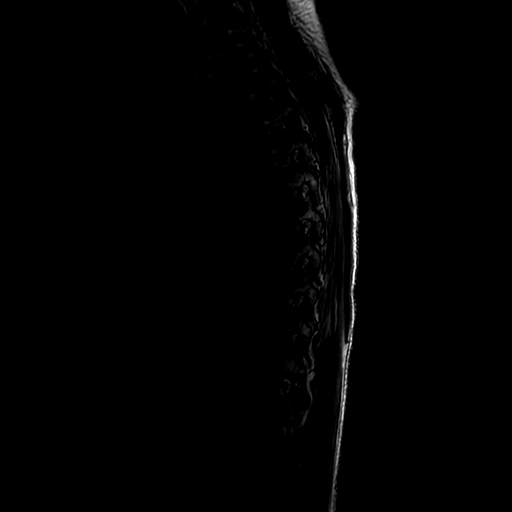
[im 15/15]
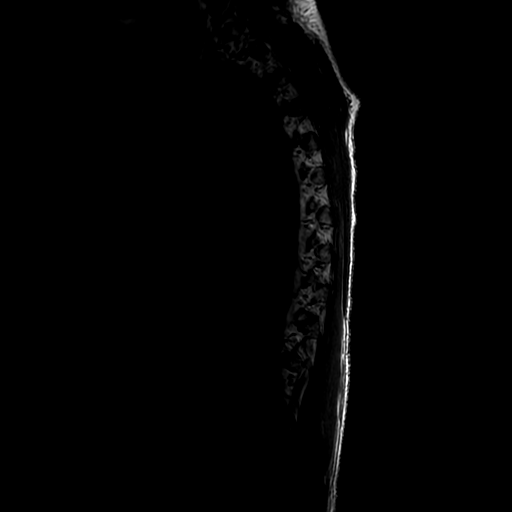

[Series 6: T1 · sagittal · 3.0mm · 0.66mm/px · 3 of 15 slices shown (2 of 2)]
[im 1/15]
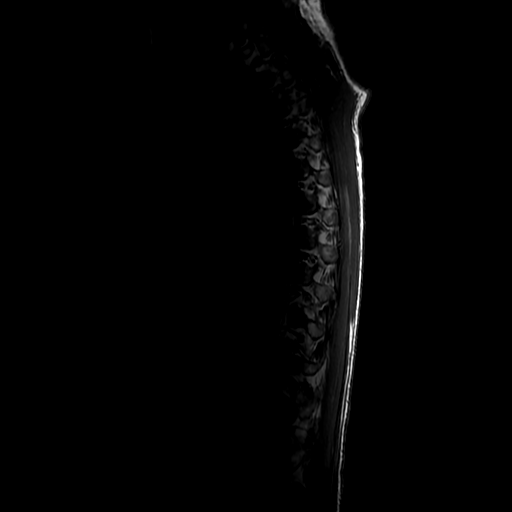
[im 8/15]
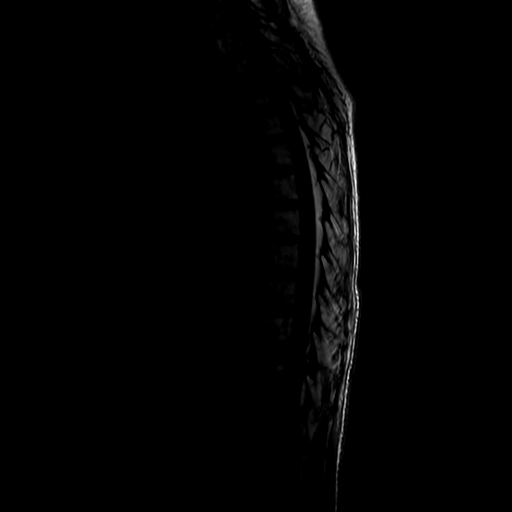
[im 15/15]
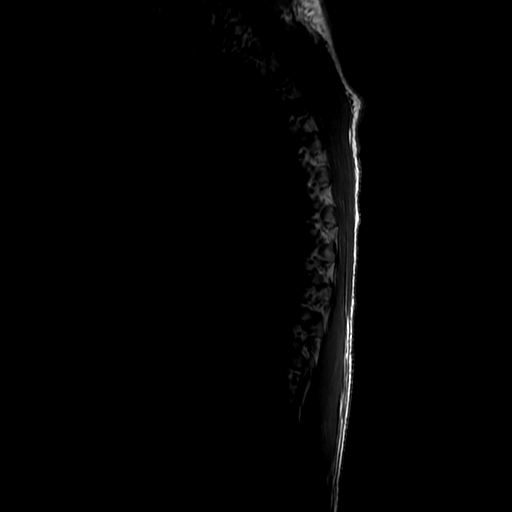

[Series 7: T2 · axial · 4.0mm · 0.39mm/px · z∈[-393,-196]mm · 6 of 38 slices shown (2 of 2)]
[im 1/38]
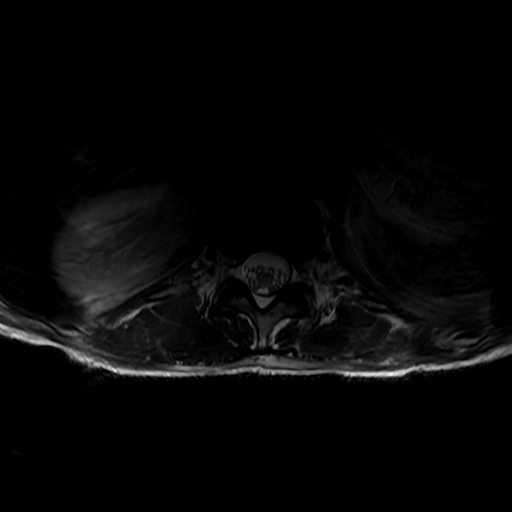
[im 6/38]
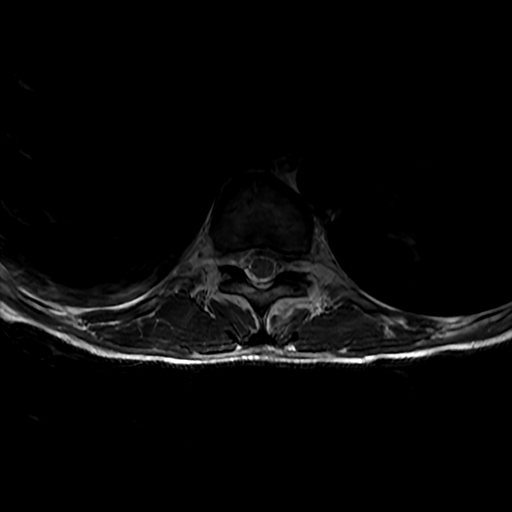
[im 12/38]
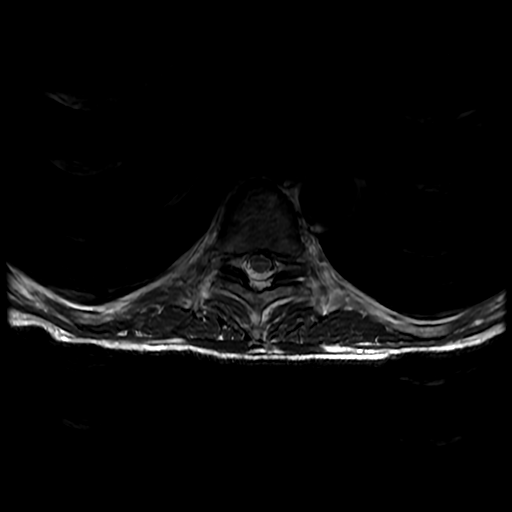
[im 18/38]
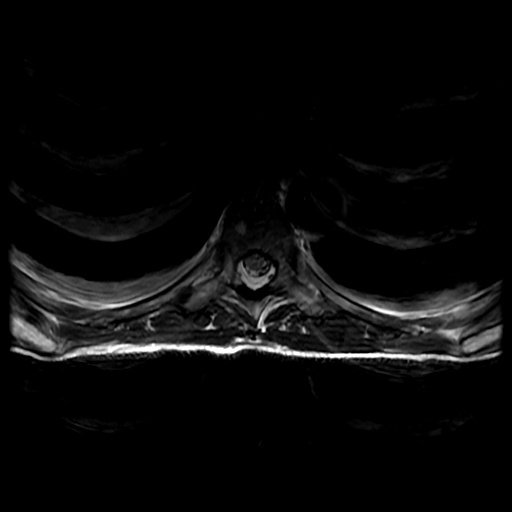
[im 20/38]
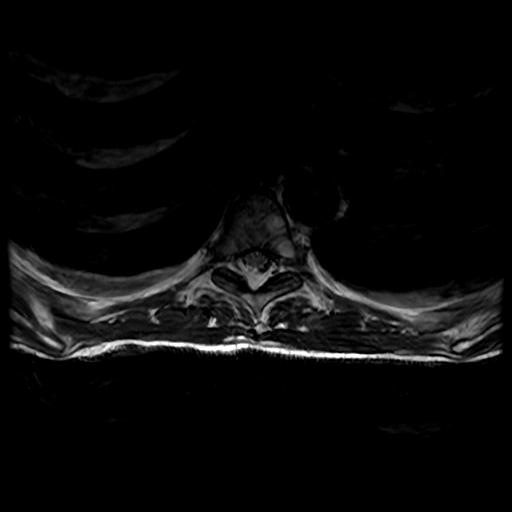
[im 32/38]
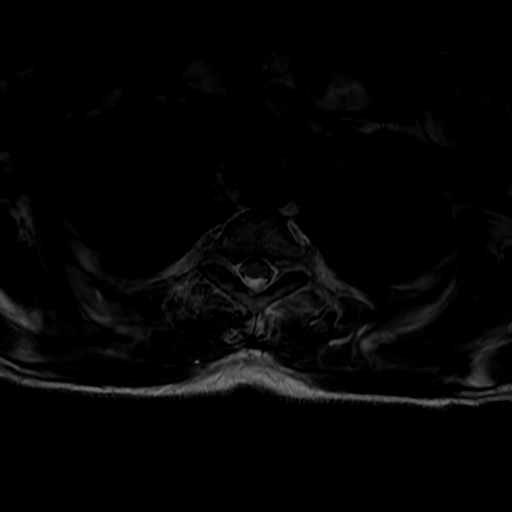

[17 of 48 positions shown; findings below may reference images not displayed]

FINDINGS: Despite efforts by the technologist and patient, mild motion
artifact is present on today's exam and could not be eliminated.
This reduces exam sensitivity and specificity.

MRI CERVICAL SPINE FINDINGS

Alignment: Straightening without focal angulation. Minimal
retrolisthesis at C6-7.

Vertebrae: No definite evidence of acute fracture or traumatic
subluxation. Asymmetric facet arthropathy on the left with
subchondral and surrounding soft tissue edema at C3-4 and C7-T1. No
discal hyperintensity or endplate edema.

Cord: Normal in signal and caliber. Relatively small cervical spinal
canal.

Posterior Fossa, vertebral arteries, paraspinal tissues:
Intracranial findings dictated separately.Bilateral vertebral artery
flow voids. There is prevertebral soft tissue swelling and
ill-defined fluid extending from C2 through C6. No focal paraspinal
fluid collections are identified. Possible mild edema within the
posterior soft tissues.

Disc levels:

C2-3: No significant findings.

C3-4: The disc appears normal. Asymmetric facet arthropathy on the
left with subchondral and surrounding soft tissue edema. Mild left
foraminal narrowing. No focal fluid collection or cord deformity.

C4-5: Mild disc bulging and uncinate spurring. Mild foraminal
narrowing bilaterally. No cord deformity.

C5-6: Mild disc bulging and uncinate spurring. Mild foraminal
narrowing bilaterally. No cord deformity.

C6-7: Mild spondylosis with loss of disc height and posterior
osteophytes covering diffusely bulging disc material. Mild bilateral
facet hypertrophy. There is partial effacement of the CSF
surrounding the cord, but no cord deformity. Moderate foraminal
narrowing bilaterally.

C7-T1: Mild disc bulging. Asymmetric facet arthropathy on the left
with mild subchondral and surrounding soft tissue edema. No spinal
stenosis or nerve root encroachment.

MRI THORACIC SPINE FINDINGS

Alignment:  Physiologic.

Vertebrae: No acute or suspicious osseous findings. No evidence of
fracture, discitis or osteomyelitis.

Cord: The thoracic cord appears normal in signal and caliber.Conus
medullaris extends to the L1 level. The spinal canal is relatively
small.

Paraspinal and other soft tissues: Suggested generalized soft tissue
edema with trace bilateral pleural effusions. No focal paraspinal
abnormality identified. Peripheral 8 mm right upper lobe pulmonary
nodule is unchanged from recent CT.

Disc levels:

The thoracic discs are well hydrated with maintained height. No
evidence of disc herniation, spinal stenosis or nerve root
encroachment.
IMPRESSION: 1. Suggested generalized soft tissue edema with ill-defined fluid in
the prevertebral soft tissues. In the setting of trauma, this could
indicate ligamentous injury.
2. No definite acute fracture or traumatic subluxation. Asymmetric
facet arthropathy on the left at C3-4 and C7-T1 with associated
subchondral marrow and surrounding soft tissue edema, likely
degenerative. In the setting of recent trauma, CT of the cervical
spine should be considered to better exclude acute osseous injury.
3. Cervical spondylosis as described, most advanced at C6-7 where
there is borderline spinal stenosis and moderate foraminal narrowing
bilaterally.
4. No significant thoracic spondylosis.
5. No cord deformity or abnormal cord signal identified.

## 2021-11-23 IMAGING — MR MR MRA HEAD W/O CM
1 series · 20 of 48 positions shown · non-contrast
Comparison: No pertinent prior exam.

CLINICAL DATA: Stroke, follow-up

EXAM:
MRA HEAD WITHOUT CONTRAST
TECHNIQUE: Angiographic images of the Circle of Willis were acquired using MRA
technique without intravenous contrast.

[Series 2: ax (id) · axial · 1.0mm · 0.43mm/px · z∈[-66,+13]mm · 20 of 176 slices shown]
[im 1/176]
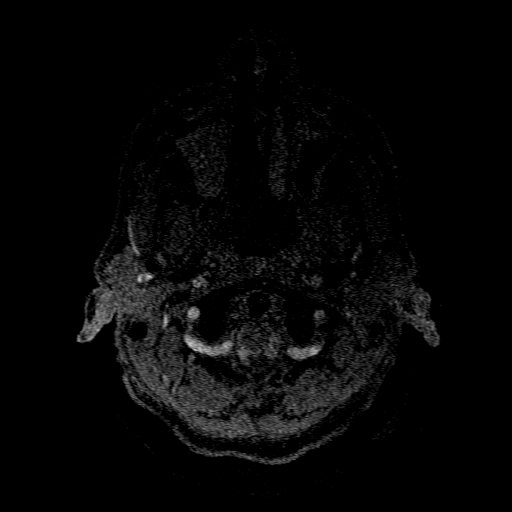
[im 4/176]
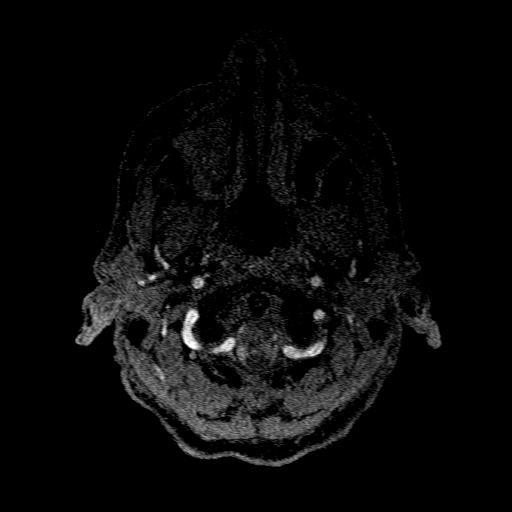
[im 8/176]
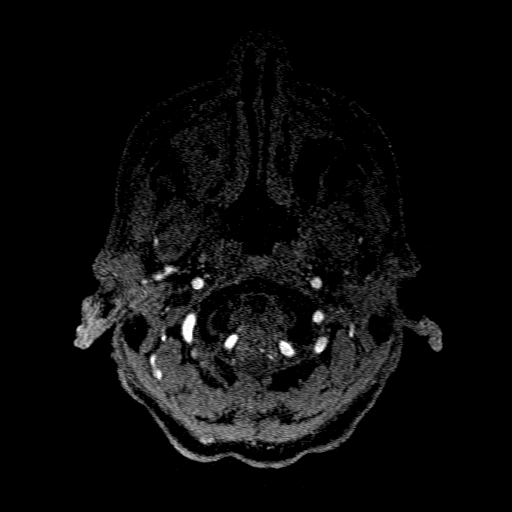
[im 12/176]
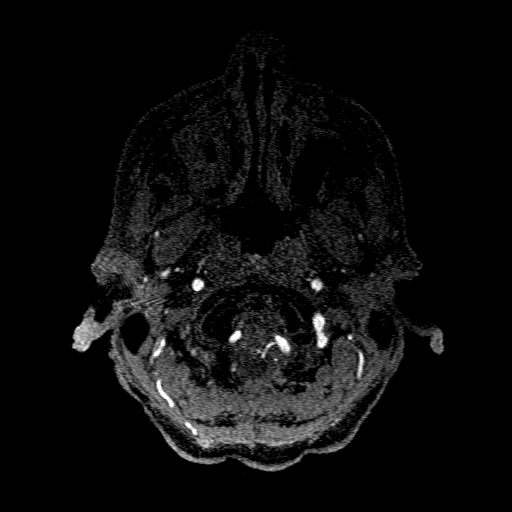
[im 15/176]
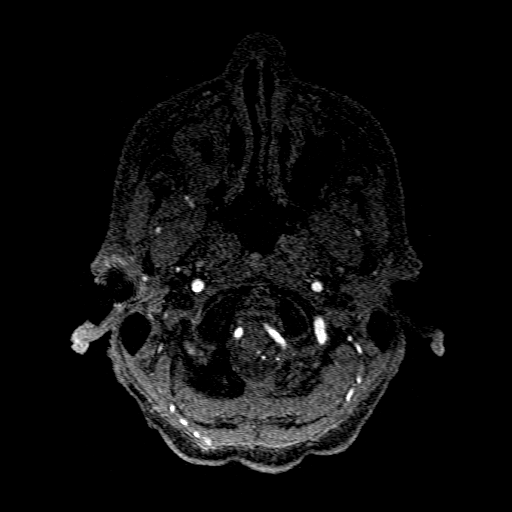
[im 19/176]
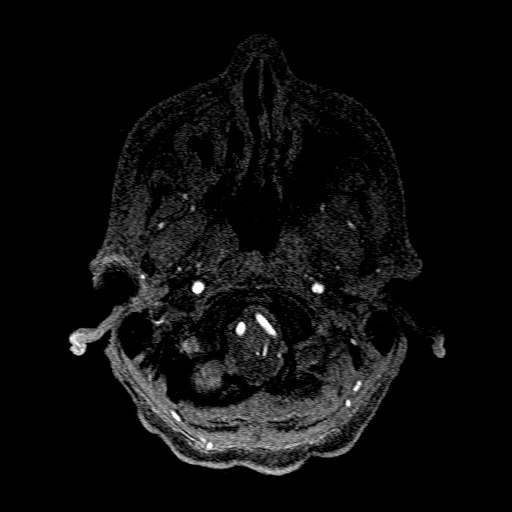
[im 23/176]
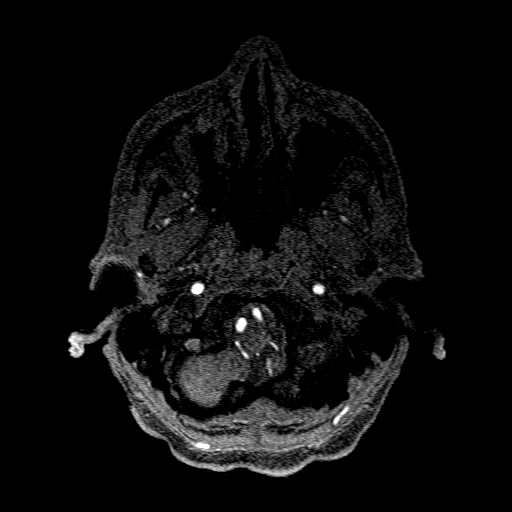
[im 27/176]
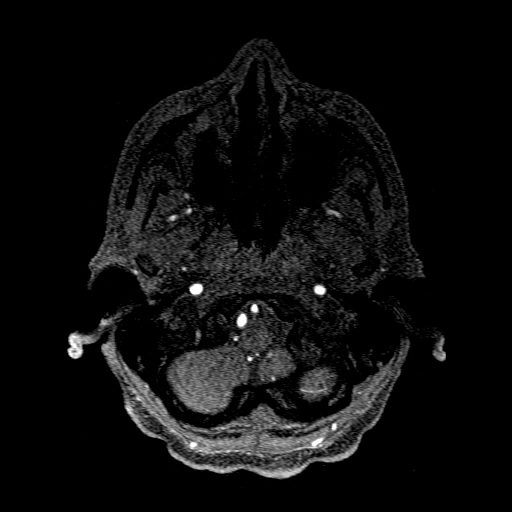
[im 30/176]
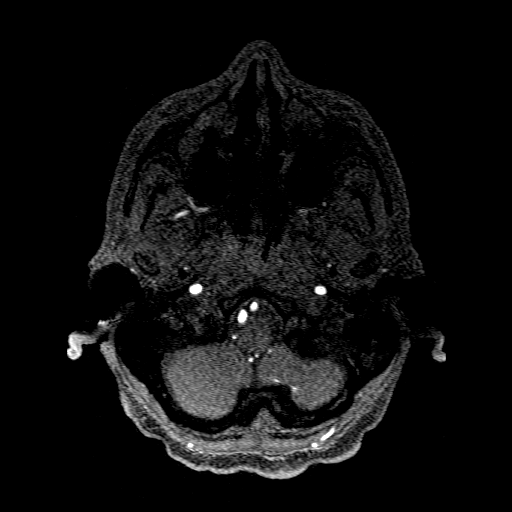
[im 34/176]
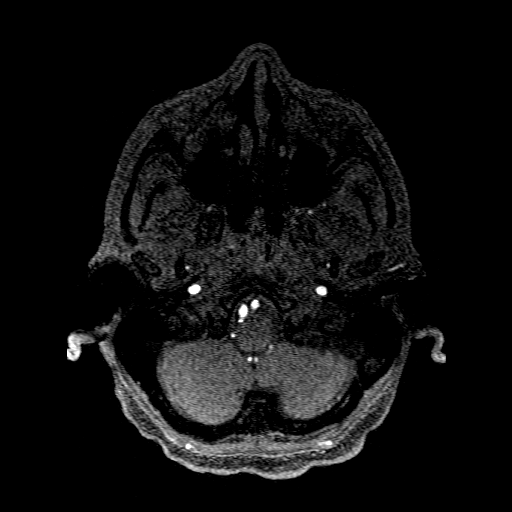
[im 38/176]
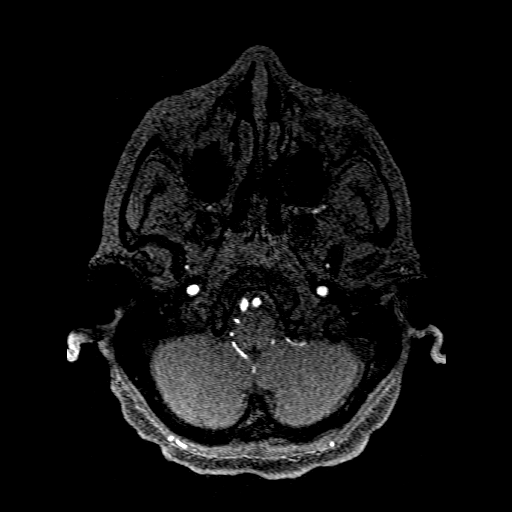
[im 41/176]
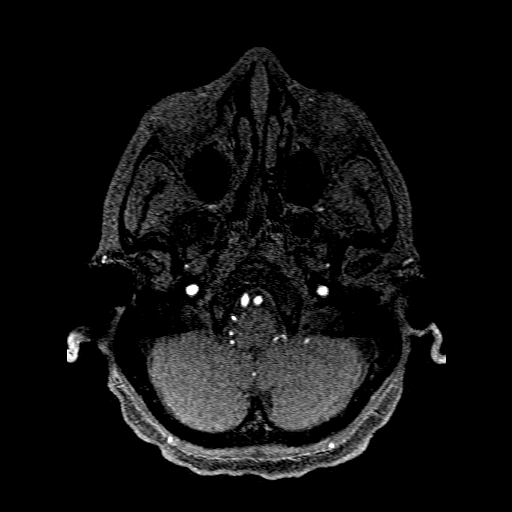
[im 56/176]
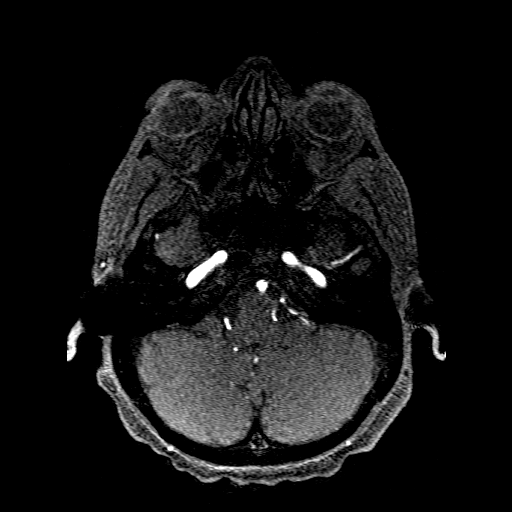
[im 79/176]
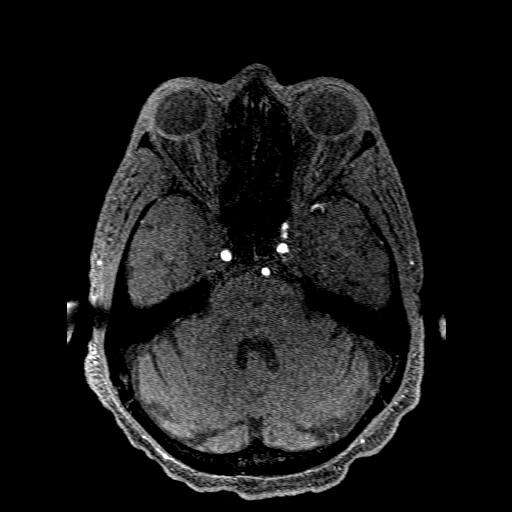
[im 90/176]
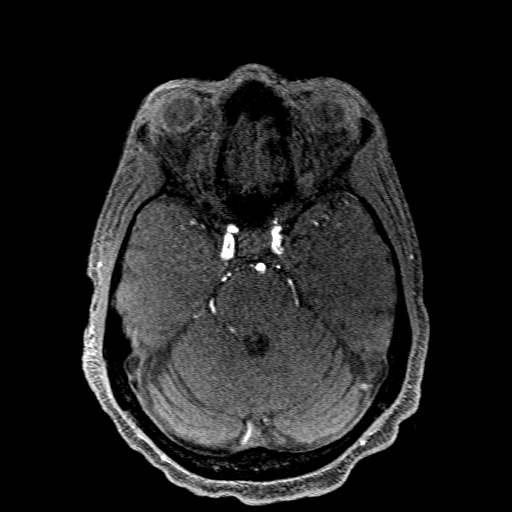
[im 101/176]
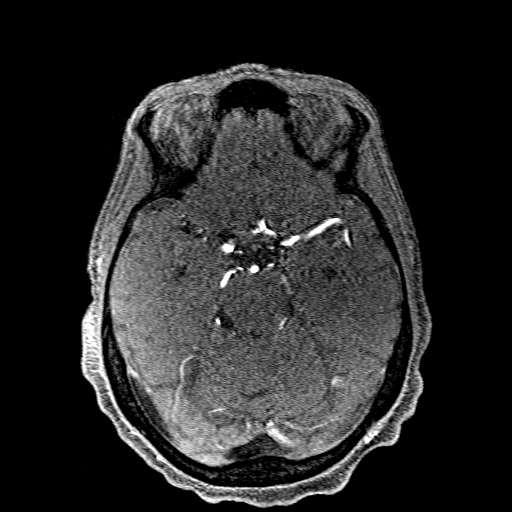
[im 123/176]
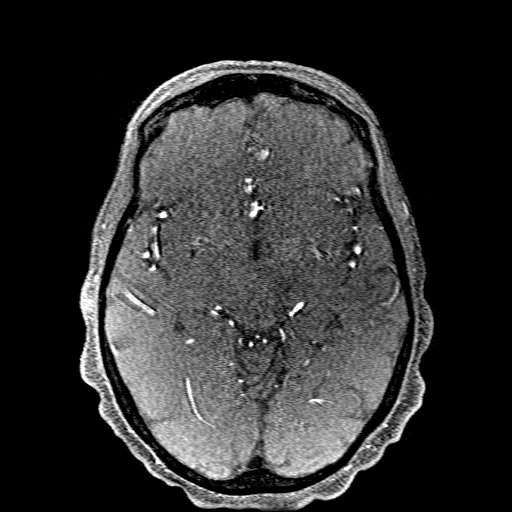
[im 146/176]
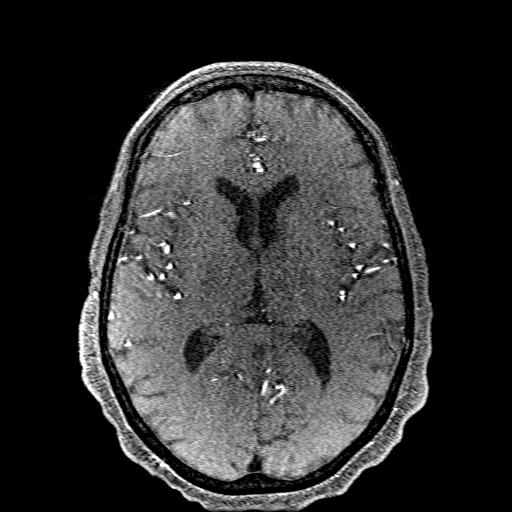
[im 149/176]
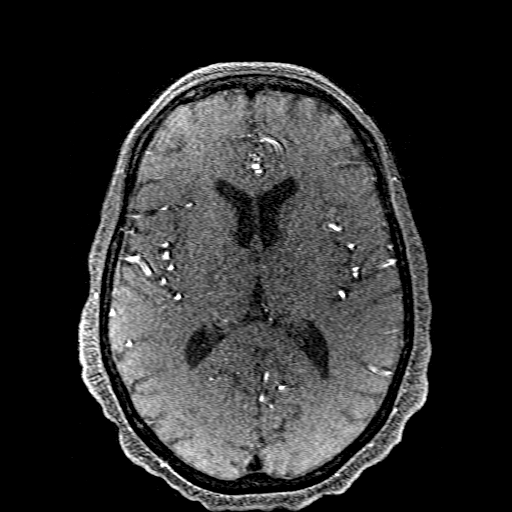
[im 168/176]
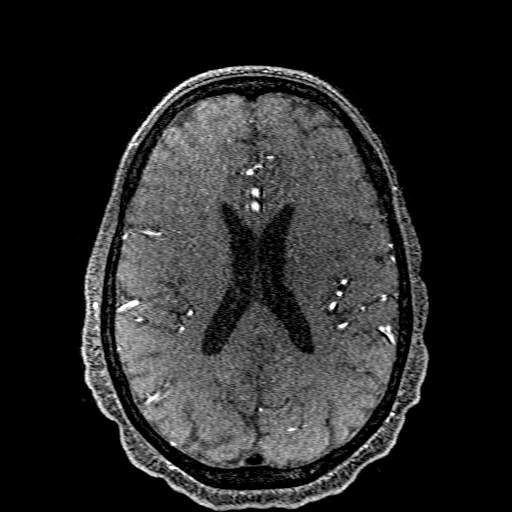

[20 of 48 positions shown; findings below may reference images not displayed]

FINDINGS: Motion artifact is present.

Anterior circulation: Intracranial internal carotid arteries are
patent. Anterior and middle cerebral arteries are patent.

Posterior circulation: Intracranial vertebral arteries are patent.
Basilar artery is patent. Major cerebellar artery origins are
patent. Small bilateral posterior communicating arteries are
present. Posterior cerebral arteries are patent.

Other: No definite aneurysm within above limitation.
IMPRESSION: No proximal intracranial vessel occlusion or significant stenosis.

## 2021-11-23 IMAGING — MR MR CERVICAL SPINE W/O CM
4 of 5 series · 17 of 48 positions shown · non-contrast
Comparison: Chest CT [DATE] and [DATE]. Cervical spine
radiographs [DATE].

CLINICAL DATA: Neck and back trauma. Abnormal neuro examination.
History of diabetes and hypertension.

EXAM:
MRI CERVICAL AND THORACIC SPINE WITHOUT CONTRAST
TECHNIQUE: Multiplanar and multiecho pulse sequences of the cervical spine, to
include the craniocervical junction and cervicothoracic junction,
and the thoracic spine, were obtained without intravenous contrast.

[Series 3: T2 · sagittal · 3.0mm · 0.43mm/px · 5 of 16 slices shown (1 of 2)]
[im 1/16]
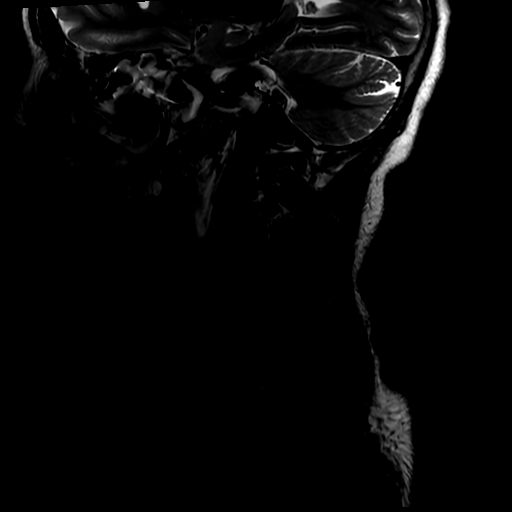
[im 4/16]
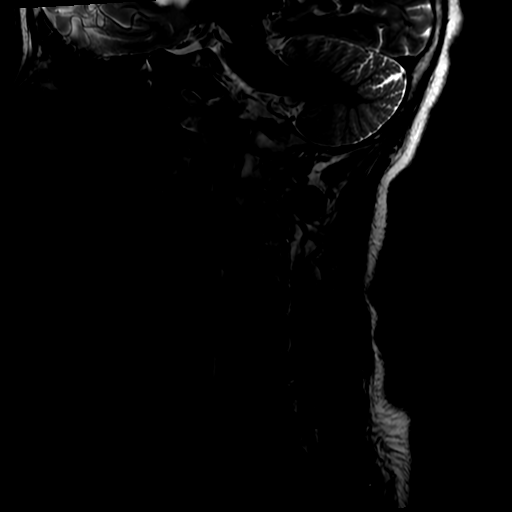
[im 8/16]
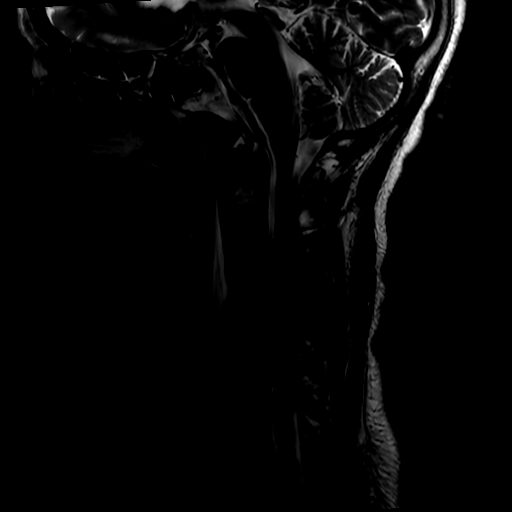
[im 12/16]
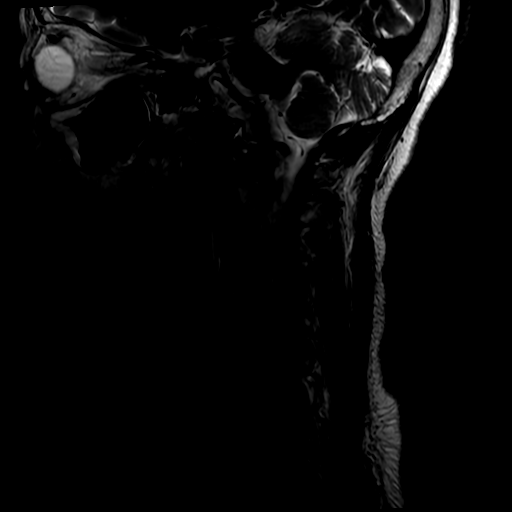
[im 16/16]
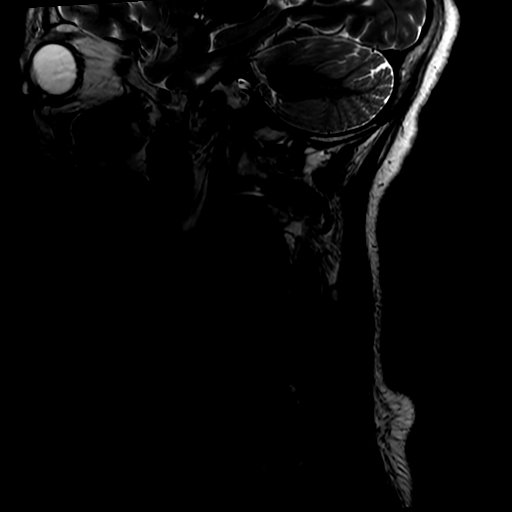

[Series 4: FLAIR · sagittal · 3.0mm · 0.43mm/px · 3 of 16 slices shown]
[im 1/16]
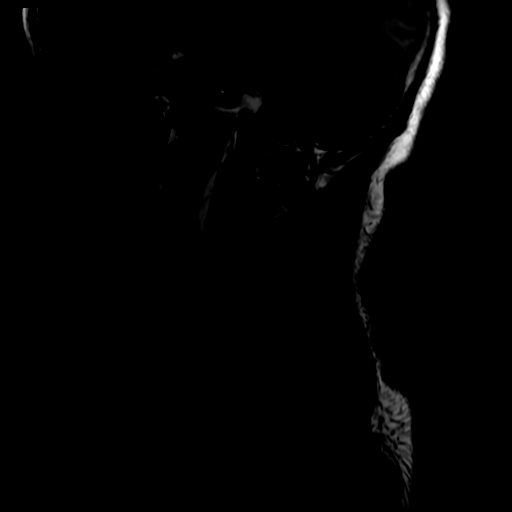
[im 8/16]
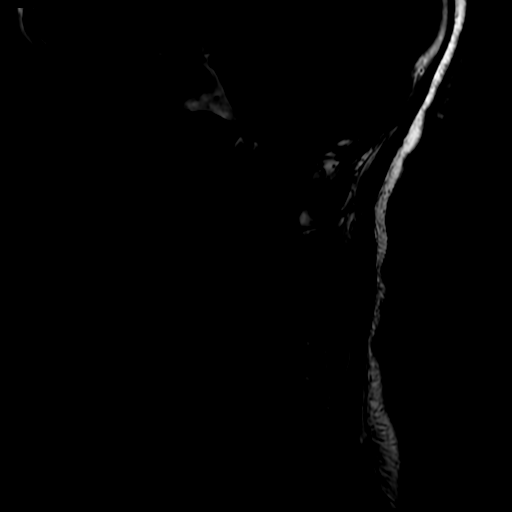
[im 16/16]
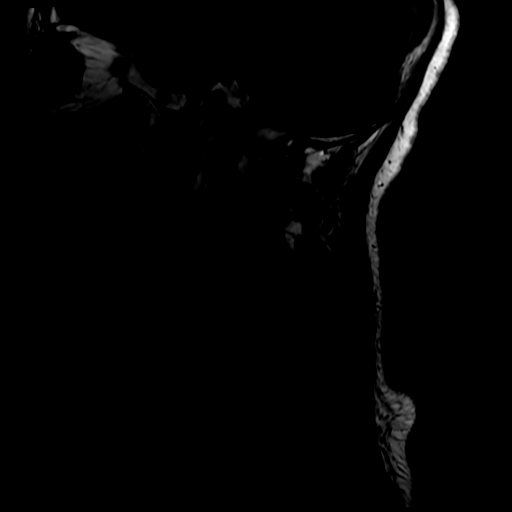

[Series 5: STIR · sagittal · 3.0mm · 0.43mm/px · 3 of 16 slices shown]
[im 1/16]
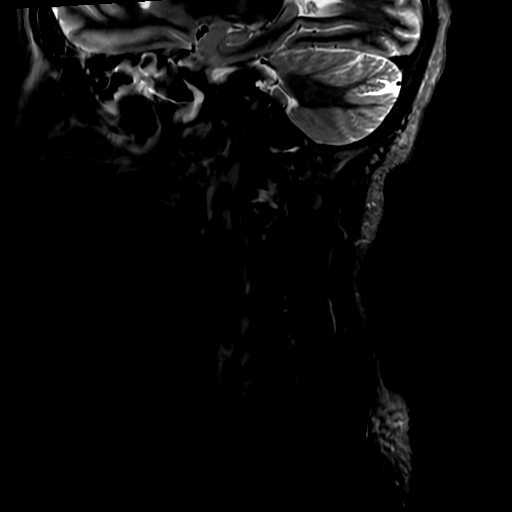
[im 8/16]
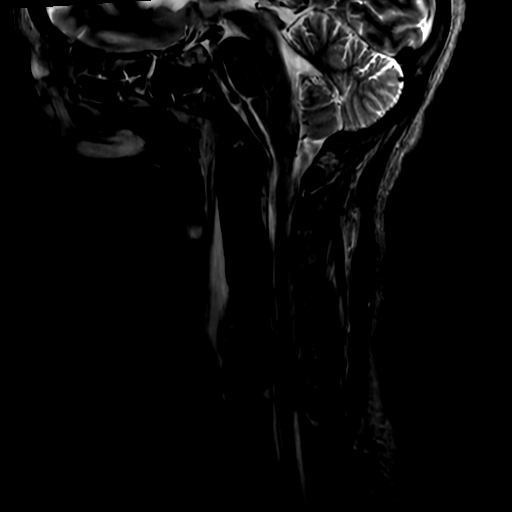
[im 16/16]
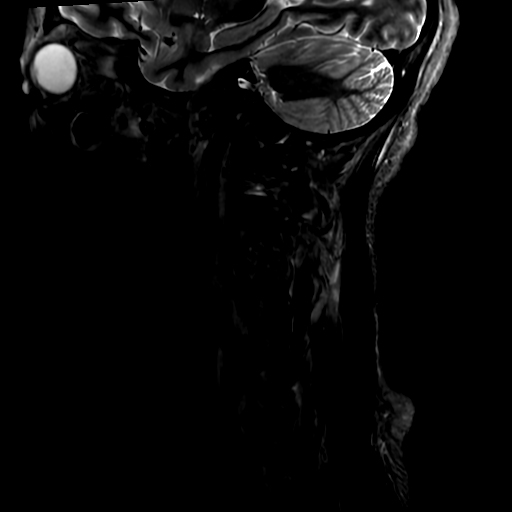

[Series 7: T2 · axial · 3.0mm · 0.35mm/px · z∈[-168,-92]mm · 6 of 29 slices shown (2 of 2)]
[im 1/29]
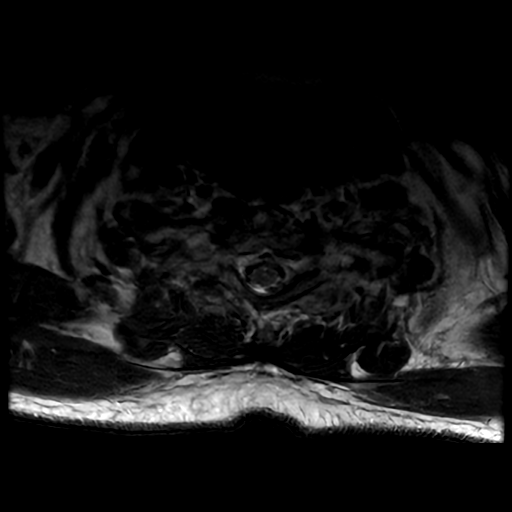
[im 4/29]
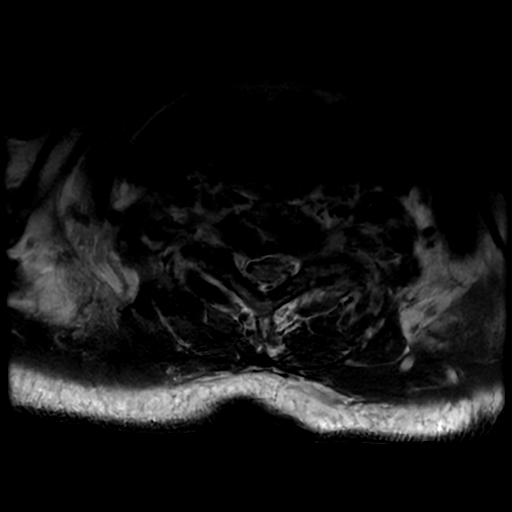
[im 8/29]
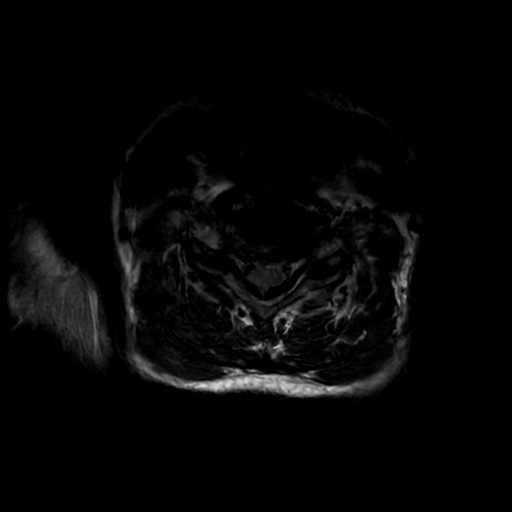
[im 11/29]
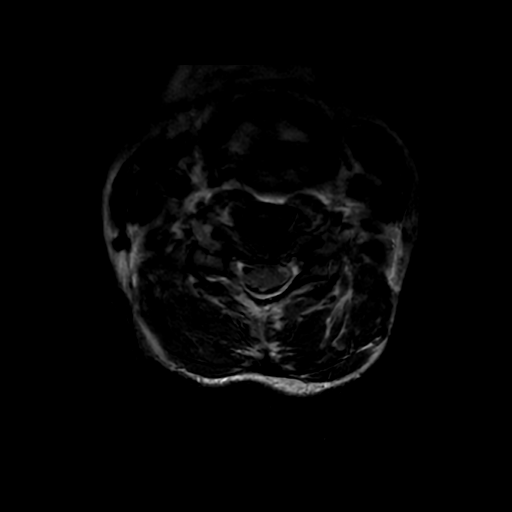
[im 15/29]
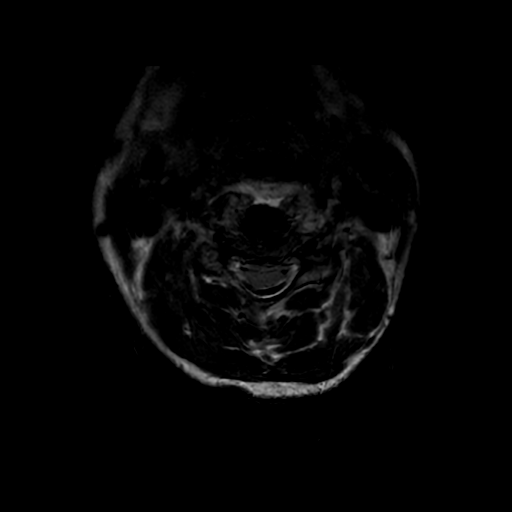
[im 25/29]
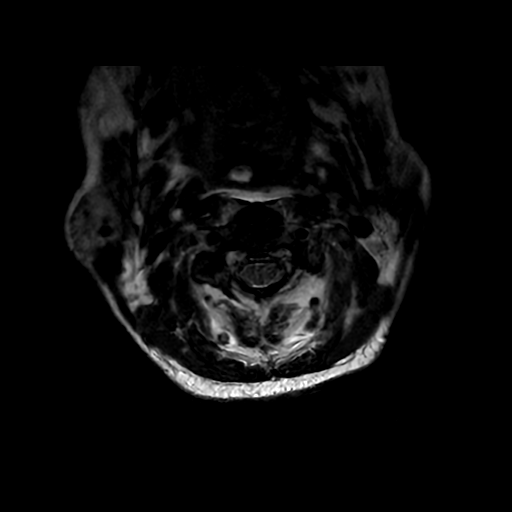

[17 of 48 positions shown; findings below may reference images not displayed]

FINDINGS: Despite efforts by the technologist and patient, mild motion
artifact is present on today's exam and could not be eliminated.
This reduces exam sensitivity and specificity.

MRI CERVICAL SPINE FINDINGS

Alignment: Straightening without focal angulation. Minimal
retrolisthesis at C6-7.

Vertebrae: No definite evidence of acute fracture or traumatic
subluxation. Asymmetric facet arthropathy on the left with
subchondral and surrounding soft tissue edema at C3-4 and C7-T1. No
discal hyperintensity or endplate edema.

Cord: Normal in signal and caliber. Relatively small cervical spinal
canal.

Posterior Fossa, vertebral arteries, paraspinal tissues:
Intracranial findings dictated separately.Bilateral vertebral artery
flow voids. There is prevertebral soft tissue swelling and
ill-defined fluid extending from C2 through C6. No focal paraspinal
fluid collections are identified. Possible mild edema within the
posterior soft tissues.

Disc levels:

C2-3: No significant findings.

C3-4: The disc appears normal. Asymmetric facet arthropathy on the
left with subchondral and surrounding soft tissue edema. Mild left
foraminal narrowing. No focal fluid collection or cord deformity.

C4-5: Mild disc bulging and uncinate spurring. Mild foraminal
narrowing bilaterally. No cord deformity.

C5-6: Mild disc bulging and uncinate spurring. Mild foraminal
narrowing bilaterally. No cord deformity.

C6-7: Mild spondylosis with loss of disc height and posterior
osteophytes covering diffusely bulging disc material. Mild bilateral
facet hypertrophy. There is partial effacement of the CSF
surrounding the cord, but no cord deformity. Moderate foraminal
narrowing bilaterally.

C7-T1: Mild disc bulging. Asymmetric facet arthropathy on the left
with mild subchondral and surrounding soft tissue edema. No spinal
stenosis or nerve root encroachment.

MRI THORACIC SPINE FINDINGS

Alignment:  Physiologic.

Vertebrae: No acute or suspicious osseous findings. No evidence of
fracture, discitis or osteomyelitis.

Cord: The thoracic cord appears normal in signal and caliber.Conus
medullaris extends to the L1 level. The spinal canal is relatively
small.

Paraspinal and other soft tissues: Suggested generalized soft tissue
edema with trace bilateral pleural effusions. No focal paraspinal
abnormality identified. Peripheral 8 mm right upper lobe pulmonary
nodule is unchanged from recent CT.

Disc levels:

The thoracic discs are well hydrated with maintained height. No
evidence of disc herniation, spinal stenosis or nerve root
encroachment.
IMPRESSION: 1. Suggested generalized soft tissue edema with ill-defined fluid in
the prevertebral soft tissues. In the setting of trauma, this could
indicate ligamentous injury.
2. No definite acute fracture or traumatic subluxation. Asymmetric
facet arthropathy on the left at C3-4 and C7-T1 with associated
subchondral marrow and surrounding soft tissue edema, likely
degenerative. In the setting of recent trauma, CT of the cervical
spine should be considered to better exclude acute osseous injury.
3. Cervical spondylosis as described, most advanced at C6-7 where
there is borderline spinal stenosis and moderate foraminal narrowing
bilaterally.
4. No significant thoracic spondylosis.
5. No cord deformity or abnormal cord signal identified.

## 2021-11-23 MED ORDER — DULOXETINE HCL 60 MG PO CPEP: 60.0000 mg | ORAL_CAPSULE | Freq: Every day | ORAL | Status: DC

## 2021-11-23 MED ORDER — NOVOLIN 70/30 FLEXPEN RELION (70-30) 100 UNIT/ML ~~LOC~~ SUPN: 10.0000 [IU] | PEN_INJECTOR | Freq: Two times a day (BID) | SUBCUTANEOUS | 0 refills | Status: DC

## 2021-11-23 MED ORDER — RENA-VITE PO TABS: 1.0000 | ORAL_TABLET | Freq: Every day | ORAL | Status: DC

## 2021-11-23 MED ORDER — OXYCODONE-ACETAMINOPHEN 5-325 MG PO TABS: 1.0000 | ORAL_TABLET | Freq: Once | ORAL | Status: DC

## 2021-11-23 MED ORDER — DULOXETINE HCL 30 MG PO CPEP
30.0000 mg | ORAL_CAPSULE | Freq: Once | ORAL | Status: DC
  Filled 2021-11-23: qty 1

## 2021-11-23 MED ORDER — SUCRALFATE 1 G PO TABS: 1.0000 g | ORAL_TABLET | Freq: Three times a day (TID) | ORAL | 0 refills | Status: DC

## 2021-11-23 MED ORDER — LIDOCAINE 5 % EX PTCH
2.0000 | MEDICATED_PATCH | Freq: Every day | CUTANEOUS | Status: DC
  Administered 2021-11-23: 2 via TRANSDERMAL
  Filled 2021-11-23: qty 2

## 2021-11-23 MED ORDER — POTASSIUM CHLORIDE CRYS ER 20 MEQ PO TBCR
40.0000 meq | EXTENDED_RELEASE_TABLET | Freq: Once | ORAL | Status: AC
  Administered 2021-11-23: 40 meq via ORAL
  Filled 2021-11-23: qty 2

## 2021-11-23 MED ORDER — OXYCODONE-ACETAMINOPHEN 5-325 MG PO TABS
1.0000 | ORAL_TABLET | Freq: Once | ORAL | Status: AC
Start: 2021-11-23 — End: 2021-11-23
  Administered 2021-11-23: 1 via ORAL
  Filled 2021-11-23: qty 1

## 2021-11-23 MED ORDER — ACETAMINOPHEN 500 MG PO TABS: 1000.0000 mg | ORAL_TABLET | Freq: Three times a day (TID) | ORAL | Status: DC | PRN

## 2021-11-23 MED ORDER — ENSURE ENLIVE PO LIQD: 237.0000 mL | Freq: Three times a day (TID) | ORAL | Status: DC

## 2021-11-23 MED ORDER — ROSUVASTATIN CALCIUM 40 MG PO TABS: 40.0000 mg | ORAL_TABLET | Freq: Every day | ORAL | 0 refills | Status: DC

## 2021-11-23 MED ORDER — DULOXETINE HCL 60 MG PO CPEP
60.0000 mg | ORAL_CAPSULE | Freq: Every day | ORAL | 0 refills | Status: DC
Start: 2021-11-24 — End: 2022-03-18

## 2021-11-23 MED ORDER — GABAPENTIN 300 MG PO CAPS: 300.0000 mg | ORAL_CAPSULE | Freq: Every day | ORAL | 0 refills | Status: DC

## 2021-11-23 MED ORDER — OXYCODONE HCL 5 MG PO TABS: 10.0000 mg | ORAL_TABLET | Freq: Once | ORAL | Status: DC

## 2021-11-23 MED ORDER — ASPIRIN 81 MG PO TBEC: 81.0000 mg | DELAYED_RELEASE_TABLET | Freq: Every day | ORAL | 11 refills | Status: DC

## 2021-11-23 NOTE — Progress Notes (Signed)
Inpatient Rehab Admissions Coordinator:  ? ?Spoke with PT and OT, both of whom are updating their recommendations to outpatient.  Will sign off for CIR at this time.  ? ?Shann Medal, PT, DPT ?Admissions Coordinator ?[phone] ?11/23/21  ?2:44 PM ? ?

## 2021-11-23 NOTE — Progress Notes (Signed)
Carotid artery duplex has been completed. ?Preliminary results can be found in CV Proc through chart review.  ? ?11/23/21 1:00 PM ?Carlos Levering RVT   ?

## 2021-11-23 NOTE — Discharge Instructions (Addendum)
Dear Ms. Juliane Lack, ?I am so glad you are feeling better and can be discharged today (11/23/2021)! You were admitted for severely high blood pressure, generalized weakness, and blood in your stools.  ? ?Please see the following instructions: ?Please see your primary care / family doctor for your medical issues, concerns and or health care needs. Follow-up on the following: ?Potassium ?Anemia ?Diabetes regimen ?Pain control ?NEW meds:  ?Sucralfate 1 g 3 times daily with meals and at bedtime ?Aspirin 81 mg daily ?Continue boost or Ensure supplements if with decreased appetite ?CONTINUE home meds (somewhat changes): ?Carvedilol 50 mg twice daily with a meal ?Duloxetine 60 mg daily ?Gabapentin 300 mg daily ?Rosuvastatin 40 mg daily ? ?Report any adverse effects and/or reactions from the medicines to your outpatient provider promptly. Do not engage in alcohol and or illegal drug use while on prescription medicines. In the event of worsening symptoms, call 911 and/or go to the nearest ED for appropriate evaluation and treatment of symptoms. ? ?It was a pleasure meeting you, Ms. Cervenka.  I wish you and your family the best, and hope you stay happy and healthy! ? ?Rosezetta Schlatter, MD ?11/23/2021   ?

## 2021-11-23 NOTE — Progress Notes (Signed)
Inpatient Rehab Admissions Coordinator:  ? ?Attempted to reach pt by phone but no answer.  Will continue efforts.  ? ?Shann Medal, PT, DPT ?Admissions Coordinator ?[phone] ?11/23/21  ?1:04 PM ? ?

## 2021-11-23 NOTE — Evaluation (Signed)
Occupational Therapy Evaluation ?Patient Details ?Name: Amber Cross ?MRN: [id number] ?DOB: 06/15/1961 ?Today's Date: 11/23/2021 ? ? ?History of Present Illness Pt is a 61 y.o. female who presented 11/21/21 with chest and epigastric/RUQ pain, SOB, hematochezia, left-sided weakness and numbness. Work-up so far is not remarkable for ACS. 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  PMHx of T2DM, HTN, HLD, tobacco abuse, anxiety, HFpEF (EF 55-60% in April 2023), CKD stage IIIb, and a recent admission at [facility] for noncardiac chest pain thought to be of MSK etiology  ? ?Clinical Impression ?  ?Prior to this admission, patient was living primarily on her own with her son coming and going and checking on her intermittently. Patient endorses full independence, drove, and managed her IADLs without assistance. Currently, patient is requiring min A for ADLs, min A for transfers, and demonstrating ataxic movement with L>R. Patient also demonstrating decreased fine motor coordination in LUE with need for increased assistance to manage self care ADLs. Patient also noted to have increased HR when completing seated ADLs (up to 128 RN notified at end of session). OT recommending AIR once medically ready as patient was completely independent prior to admission, shows great rehab potential, and has the family support (per PT can live with sister or sister can stay with her if needed). OT will continue to follow acutely to address deficits outlined below.  ?   ? ?Recommendations for follow up therapy are one component of a multi-disciplinary discharge planning process, led by the attending physician.  Recommendations may be updated based on patient status, additional functional criteria and insurance authorization.  ? ?Follow Up Recommendations ? Acute inpatient rehab (3hours/day)  ?  ?Assistance Recommended at Discharge Intermittent  Supervision/Assistance  ?Patient can return home with the following A little help with walking and/or transfers;A little help with bathing/dressing/bathroom;Assistance with cooking/housework;Direct supervision/assist for medications management;Assist for transportation;Direct supervision/assist for financial management;Help with stairs or ramp for entrance ? ?  ?Functional Status Assessment ? Patient has had a recent decline in their functional status and demonstrates the ability to make significant improvements in function in a reasonable and predictable amount of time.  ?Equipment Recommendations ? Other (comment) (Will continue to assess)  ?  ?Recommendations for Other Services   ? ? ?  ?Precautions / Restrictions Precautions ?Precautions: Fall;Other (comment) ?Precaution Comments: ataxic ?Restrictions ?Weight Bearing Restrictions: No  ? ?  ? ?Mobility Bed Mobility ?Overal bed mobility: Needs Assistance ?Bed Mobility: Supine to Sit ?  ?  ?Supine to sit: Min guard, HOB elevated ?  ?  ?General bed mobility comments: Min guard for safety ?  ? ?Transfers ?Overall transfer level: Needs assistance ?Equipment used: Rolling walker (2 wheels) ?Transfers: Sit to/from Stand ?Sit to Stand: Min assist ?  ?  ?  ?  ?  ?General transfer comment: MinA to power up to stand and steady with noted instability, no evidence of knee buckling but noted to rush through movement with cues for pacing due to increased instability. ?  ? ?  ?Balance Overall balance assessment: Needs assistance ?Sitting-balance support: No upper extremity supported, Feet supported, Bilateral upper extremity supported ?Sitting balance-Leahy Scale: Poor ?  ?  ?Standing balance support: Bilateral upper extremity supported, During functional activity, Reliant on assistive device for balance ?Standing balance-Leahy Scale: Poor ?Standing balance comment: Reliant on RW and minA ?  ?  ?  ?  ?  ?  ?  ?  ?  ?  ?  ?   ? ?  ADL either performed or assessed with clinical  judgement  ? ?ADL Overall ADL's : Needs assistance/impaired ?Eating/Feeding: Set up;Sitting ?  ?Grooming: Wash/dry hands;Wash/dry face;Oral care;Set up;Sitting ?Grooming Details (indicate cue type and reason): unable to complete in standing due to poor activity tolerance and lack of coordinated movement on L causing need for increased support provided by sitting ?Upper Body Bathing: Minimal assistance;Sitting ?  ?Lower Body Bathing: Minimal assistance;Sitting/lateral leans;Sit to/from stand ?  ?Upper Body Dressing : Minimal assistance;Sitting ?  ?Lower Body Dressing: Minimal assistance;Sitting/lateral leans;Sit to/from stand ?  ?Toilet Transfer: Minimal assistance;Ambulation;Rollator (4 wheels);Regular Toilet ?  ?Toileting- Clothing Manipulation and Hygiene: Minimal assistance;Sitting/lateral lean;Sit to/from stand ?  ?  ?  ?Functional mobility during ADLs: Minimal assistance;Cueing for safety;Cueing for sequencing;Rollator (4 wheels) ?General ADL Comments: Patient presenting with ataxic movement, decreased coordination, poor activity tolerance, and increased HR when engaging in seated self care tasks  ? ? ? ?Vision Baseline Vision/History: 1 Wears glasses ?Ability to See in Adequate Light: 0 Adequate ?Patient Visual Report: No change from baseline (Patient has not had glasses to fully assess if her vision has changed) ?Additional Comments: Patient has not had glasses to fully assess if her vision has changed  ?   ?Perception   ?  ?Praxis   ?  ? ?Pertinent Vitals/Pain Pain Assessment ?Pain Assessment: 0-10 ?Pain Score: 7  ?Pain Location: right upper stomach ?Pain Descriptors / Indicators: Discomfort ?Pain Intervention(s): Limited activity within patient's tolerance, Monitored during session, Repositioned  ? ? ? ?Hand Dominance Right ?  ?Extremity/Trunk Assessment Upper Extremity Assessment ?Upper Extremity Assessment: Generalized weakness;LUE deficits/detail ?LUE Deficits / Details: patient with ataxic movement and  uncoordinated Strawberry over and undershoots targets ?LUE Sensation: decreased light touch ?LUE Coordination: decreased fine motor;decreased gross motor ?  ?Lower Extremity Assessment ?Lower Extremity Assessment: Defer to PT evaluation ?  ?Cervical / Trunk Assessment ?Cervical / Trunk Assessment: Normal ?  ?Communication Communication ?Communication: No difficulties ?  ?Cognition Arousal/Alertness: Awake/alert ?Behavior During Therapy: [facility] for tasks assessed/performed ?Overall Cognitive Status: Within Functional Limits for tasks assessed ?  ?  ?  ?  ?  ?  ?  ?  ?  ?  ?  ?  ?  ?  ?  ?  ?General Comments: Flat affect at times, but overall follows commands appropriately. Appears to be aware of her deficits. ?  ?  ?General Comments  HR up to 128 when completing seated grooming tasks at sink (RN made aware at end of session) ? ?  ?Exercises   ?  ?Shoulder Instructions    ? ? ?Home Living Family/patient expects to be discharged to:: Private residence ?Living Arrangements: Children (37 y.o. son (gone to work during day)) ?Available Help at Discharge: Family;Available 24 hours/day (reports can go live with her sister who works from home if needed) ?Type of Home: House ?Home Access: Stairs to enter ?Entrance Stairs-Number of Steps: 3-4 ?Entrance Stairs-Rails: Can reach both ?Home Layout: One level ?  ?  ?Bathroom Shower/Tub: Tub/shower unit ?  ?Bathroom Toilet: Standard ?Bathroom Accessibility: No ?  ?Home Equipment: Rollator (4 wheels) ?  ?  ?  ? ?  ?Prior Functioning/Environment Prior Level of Function : Independent/Modified Independent;Driving ?  ?  ?  ?  ?  ?  ?Mobility Comments: Uses rollator for mobility. Denies any falls, but chart review indicates several falls in past week. ?ADLs Comments: Stands for showers. No assistance needed for ADLs, cleaning, cooking, meds/financial management etc ?  ? ?  ?  ?  OT Problem List: Decreased strength;Decreased activity tolerance;Impaired balance (sitting and/or standing);Decreased  coordination;Decreased safety awareness;Decreased knowledge of use of DME or AE;Decreased knowledge of precautions;Impaired sensation;Impaired UE functional use;Pain ?  ?   ?OT Treatment/Interventions: Self-care/ADL training

## 2021-11-23 NOTE — Progress Notes (Addendum)
NEUROLOGY CONSULTATION PROGRESS NOTE  ? ?Date of service: November 23, 2021 ?Patient Name: Amber Cross ?MRN:  [id number] ?DOB:  Aug 22, 1960 ? ?Brief HPI  ?Tamira Ryland is a 61 y.o. 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  ?  ?No family at bedside. ?  ?Fell in her kitchen sometime last week after pivoting where her legs collapsed with associated dizziness and SOB. She landed on all 4s and was able to pull herself up soon after. She had resulting left arm pain and weakness. Denied hitting her head, LOC, N/V, headaches, palpitations, blurry vision, diplopia, hitting her shoulder.  ?She had 2 more falls, although could not remember exactly when.  ?  ?She has had generalized weakness since Feb 2023, which was when her "health went downhill for unknown reasons". She declined to further explain circumstances during this time. Reported that she felt to weak to get up out of bed to attend work in Feb and has not worked since.  ?  ?During evaluation, she had bowel incontinence and did not feel the urge. Also reported having urinary incontinence too, but is able to feel bladder distension and urge to go. Denied saddle paresthesia.  ?  ?Patient still has left arm weakness, that has remained the same, but her generalized weakness has worsened. Her biggest concern today is her low energy level.  ?  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 She denied falls, traumatic events, or spine procedures prior. Reported bowel incontinence daily since, applied for disabilities on Sep 09, 2021. Son's girlfriend will not let patient see her grandchildren, last grandchildren visit Jan 2023. Since then, fights with her son more, insomnia, decreased appetite,  anhedonia, decreased nrg, concentration issues. Feeling guilty, worthless, hopeless. Denied SI/HI/AVH ?  ?Interval Hx  ? ?Sister on phone.  ? ?Patient still having bowel incontinence. Weakness and numbness has resolved today.  ?Only complaints are incontinence and RUQ pain last night.  ? ?Vitals  ? ?Vitals:  ? 11/22/21 2309 11/23/21 0123 11/23/21 0408 11/23/21 0744  ?BP: (!) 175/93  (!) 140/91 (!) 164/85  ?Pulse: 99 93 94 94  ?Resp: 20 (!) 21 19 15   ?Temp: 99.4 ?F (37.4 ?C)  99 ?F (37.2 ?C) 98.2 ?F (36.8 ?C)  ?TempSrc: Oral  Oral Oral  ?SpO2: 97% 98% 98% 96%  ?Weight:      ?Height:      ?  ? ?Body mass index is 19.09 kg/m?. ? ?Physical Exam  ?General: NAD, thin appearing ?HEENT: Poor dentition, missing teeth. ?Pulmonary: Symmetric chest rise. Non-labored respiratory effort.  ?Ext: No cyanosis, edema, or deformity  ?Musculoskeletal: Normal digits and nails by inspection. No clubbing.  ? ?Neurologic Examination  ?Mental status/Cognition:  ?Alert, attentive, engaged with evaluation. ?Oriented to self, place, month, year, president ?Attention - month of the year backwards ?Speech/language:  ?Speech clear, coherent, slowed. Thought content appropriate ?Comprehension intact-able to follow 3 step commands without difficulty.  ?Object naming and repetition intact - no difficulty with frequent and infrequent obj/words. No neglect. ?  ?Cranial Nerves: ?II: No VF deficits. ?III,IV, VI: PERRL. No gaze preference or deviation. EOMI bilaterally. No nystagmus ?V: Decreased sensation in right face.  ?VII: Face symmetric at rest and smiling. No nasolabial fold flattening  ?  VIII: Hearing normal bilaterally ?IX,X: Cough intact. ?XI: Bilateral shoulder shrug and head turn ?XII: Midline tongue extension ?Motor: ?Able to hold all 4 limbs antigravity for >10s, No drifts in all 4 limbs ?R  UE 5/5 LE 5/5  ?L UE 5/5  LE 5/5  ?Normal tone throughout.  ?Normal bulk throughout.  ?Diffused and symmetric muscle atrophy  ?  ?Sensory:  ?Decreased  sensation in: ?- left C5/6 distribution - back of upper arm ending before elbow ?- left lateral femoral cutaneous, ends above knee ?Urge to urinate intact, denied feeling urge to have bowel movement ?  ?Reflexes: ?  Right Left Comments  ? Biceps (C5/6) 1 1    ? Patellar (L3/4) 1 1    ? Achilles (S1) diminished diminished    ? Plantar mute mute    ?  ?  ?Coordination/Complex Motor:  ?- Finger to Nose - intact b/l ?- Heel to shin intact - intact b/l ?- Gait: deferred ? ?Labs  ? ?Basic Metabolic Panel:  ?Lab Results  ?Component Value Date  ? NA 135 11/23/2021  ? K 3.2 (L) 11/23/2021  ? CO2 23 11/23/2021  ? GLUCOSE 61 (L) 11/23/2021  ? BUN 23 (H) 11/23/2021  ? CREATININE 1.97 (H) 11/23/2021  ? CALCIUM 7.7 (L) 11/23/2021  ? GFRNONAA 29 (L) 11/23/2021  ? GFRAA 120 04/06/2019  ? ?HbA1c:  ?Lab Results  ?Component Value Date  ? HGBA1C 9.6 (H) 11/07/2021  ? ?LDL:  ?Lab Results  ?Component Value Date  ? [place] 86 11/08/2021  ? ?Urine Drug Screen: No results found for: LABOPIA, COCAINSCRNUR, [place], [place], THCU, LABBARB  ?Alcohol Level No results found for: ETH ?No results found for: PHENYTOIN, ZONISAMIDE, LAMOTRIGINE, LEVETIRACETA ?No results found for: PHENYTOIN, PHENOBARB, VALPROATE, CBMZ ? ?Imaging and Diagnostic studies  ?Results for orders placed during the hospital encounter of 11/21/21 ? ?CT Head without contrast: ?Unremarkable  ?  ?MRI Brain: ?1. Punctate acute subcortical white matter infarct in the left superior frontal gyrus; perirolandic or pre-motor area. No associated hemorrhage or mass effect. - poor correlation to ADC ?2. No other acute intracranial abnormality. other mildly advanced for age nonspecific bilateral frontal lobe white matter signal changes.  ?3. Paranasal sinus inflammation.  ?On: 11/22/2021 04:42     ? ?MRA C- & T-spine: ?Pending ? ?Impression  ? ?Mischele Detter is a 61 y.o. 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  ?Neurology consulted for general and left arm weakness and incidental punctate infarct. ?  ?Acute left punctate white matter infarct likely 2/2 small vessel disease rather than cardio-embolic, however left sided infarct is not contributing to her neuro-functional left UE weakness, which resolved the next day. However still recommend stroke workup due to multiple risk factors (HTN, HF, HLD, DM2, tobacco, cannabis) along with risk modifications ? ?Posterior upper arm decreased sensation, bowel incontinence concerning for possible spine lesion. MRA C- & T-spine to rule out - pending. ?  ?Generalized weakness likely 2/2 suspected blood loss anemia, possibly mood. Considering thyroid dysfunction rule out, anemia work up. ?  ?MDD: pan-SIGECAP since Feb, no SI/HI/AVH. Would benefit from prozac, if too stimulating zoloft will work.  ? ?Recommendations  ?- MRA head and neck - pending ?- Gradually lower BP to normotension ?- Hold antiplatelet therapy given GI bleed. When stable, asa  81mg  daily for secondary stroke prevention indefinitely  ?- Smoking cessation, patient amenable ?- NRT ?- Increase rosuvastatin 10mg  -> 20mg  if LDL<70 ?- Goal A1c <7 ?- Frequent neuro checks ?- Telemetry monitoring for arrhythmia  ?- PT consult, OT consult ?- Last echo 11/08/2021, no need for another ?- Stroke team to follow  ?- MRI C- & T-spine for bowel incontinence and arm weak ?- TSH ?- Anemia panel ?- Consider prozac 5 mg qAM and uptitrating as tolerated to 60mg  daily ?- Psychotherapy outpatient ?______________________________________________________________________ ? ? ?Thank you for the opportunity to take part in the care of this patient. If you have any further questions, please contact the neurology consultation attending. ? ?Signed: ?Merrily Brittle, DO ?Resident, PGY-1 ?[facility] ?11/23/2021, 9:17  AM   ? ?ATTENDING NOTE: ?I reviewed above note and agree with the assessment and plan. Pt was seen and examined.  ? ?Patient left punctate infarct seen on MRI likely to be incidental finding given she

## 2021-11-23 NOTE — Discharge Summary (Signed)
? ?Name: Amber Cross ?MRN: [id number] ?DOB: [birth year] 61 y.o. ?PCP: [name], DO ? ?Date of Admission: 11/21/2021  2:13 PM ?Date of Discharge:   11/23/2021 3:25 PM ?Attending Physician: Sid Falcon, MD ? ?Discharge Diagnosis: ?1.  Severe hypertension c/b acute punctate infarct ?2.  Diffuse chest/epigastric pain ?3.  Iron deficiency anemia ?4.  Type 2 diabetes ? ?Resolved by day of discharge: ?Generalized weakness ?Hypokalemia ?Hematochezia ? ?Discharge Medications: ?Allergies as of 11/23/2021   ? ?   Reactions  ? Penicillins   ? itch  ? Strawberry (diagnostic) Itching  ? Tomato Itching  ? ?  ? ?  ?Medication List  ?  ? ?STOP taking these medications   ? ?oxyCODONE-acetaminophen 5-325 MG tablet ?Commonly known as: PERCOCET/ROXICET ?  ?polyethylene glycol 17 g packet ?Commonly known as: MIRALAX / GLYCOLAX ?  ?senna-docusate 8.6-50 MG tablet ?Commonly known as: Senokot-S ?  ? ?  ? ?TAKE these medications   ? ?acetaminophen 500 MG tablet ?Commonly known as: TYLENOL ?Take 1,000 mg by mouth every 6 (six) hours as needed for mild pain. ?  ?albuterol 108 (90 Base) MCG/ACT inhaler ?Commonly known as: VENTOLIN HFA ?Inhale 2 puffs into the lungs every 6 (six) hours as needed for wheezing or shortness of breath. ?  ?amLODipine 5 MG tablet ?Commonly known as: NORVASC ?Take 1 tablet (5 mg total) by mouth daily. ?  ?aspirin 81 MG EC tablet ?Take 1 tablet (81 mg total) by mouth daily. Swallow whole. ?Start taking on: November 24, 2021 ?  ?Bayer Microlet Lancets lancets ?Check blood sugar up to 3 times a day as instructed ?  ?busPIRone 5 MG tablet ?Commonly known as: BUSPAR ?Take 5 mg by mouth 2 (two) times daily. ?  ?carvedilol 25 MG tablet ?Commonly known as: COREG ?Take 50 mg by mouth 2 (two) times daily with a meal. ?What changed: Another medication with the same name was removed. Continue taking this medication, and follow the directions you see here. ?  ?DULoxetine 60 MG capsule ?Commonly known as: CYMBALTA ?Take 1  capsule (60 mg total) by mouth daily. ?Start taking on: November 24, 2021 ?What changed:  ?medication strength ?how much to take ?  ?furosemide 40 MG tablet ?Commonly known as: LASIX ?Take 40 mg by mouth daily as needed for edema or fluid (swelling). ?  ?gabapentin 300 MG capsule ?Commonly known as: Neurontin ?Take 1 capsule (300 mg total) by mouth daily. ?What changed:  ?how much to take ?when to take this ?Another medication with the same name was removed. Continue taking this medication, and follow the directions you see here. ?  ?glucose blood test strip ?Check blood sugar up to 3 times a day as instructed ?  ?losartan 100 MG tablet ?Commonly known as: COZAAR ?Take 100 mg by mouth daily. ?  ?mirtazapine 15 MG tablet ?Commonly known as: REMERON ?Take 15 mg by mouth at bedtime. ?  ?Multi-Vitamin tablet ?Take 1 tablet by mouth daily. ?  ?nitroGLYCERIN 0.4 MG SL tablet ?Commonly known as: NITROSTAT ?Place 0.4 mg under the tongue every 5 (five) minutes as needed for chest pain. ?  ?NovoLIN 70/30 Kwikpen (70-30) 100 UNIT/ML KwikPen ?Generic drug: insulin isophane & regular human KwikPen ?Inject 10 Units into the skin 2 (two) times daily before lunch and supper. ?  ?rosuvastatin 40 MG tablet ?Commonly known as: CRESTOR ?Take 1 tablet (40 mg total) by mouth daily. ?Start taking on: November 24, 2021 ?What changed:  ?medication strength ?how much to take ?  ?  sucralfate 1 g tablet ?Commonly known as: CARAFATE ?Take 1 tablet (1 g total) by mouth 4 (four) times daily -  with meals and at bedtime. ?  ?Vitamin D 50 MCG (2000 UT) tablet ?Take 2,000 Units by mouth daily. ?  ? ?  ? ? ?Disposition and follow-up:   ?Amber Cross was discharged from [facility] in Stable condition.  At the hospital follow up visit please address: ? ?1.  Chest and epigastric pain: Follow-up on pain control with PPI/sucralfate, gabapentin, and increase of duloxetine. ?Iron deficiency anemia: Follow-up CBC, consider adding iron  supplement. ?Type 2 diabetes: Patient previously only on SSI; advised follow-up with diabetes coordinator. ?Hypokalemia: Resolved by day of discharge, but appears to be chronically low.  Consider adding daily potassium supplementation. ? ?2.  Labs / imaging needed at time of follow-up: CBC, BMP ? ?3.  Pending labs/ test needing follow-up: N/A ? ?Follow-up Appointments: ?5/4 Cardiology ?5/8 Pulmonology ?Will schedule hospital follow-up visit in 1-2 weeks. ? ?Hospital Course by problem list: ?#Severe hypertension ?#Chest pain ?#Acute punctate infarct ?24-hour SBP: 140-191.  Work-up unremarkable for ACS: troponin 34->35, likely reflecting demand ischemia, and her EKG shows J-point elevation in V2 and V3 but is otherwise unremarkable. CT head negative, MRI brain with punctate acute subcortical white matter infarct left superior frontal gyrus.  MRA with no vessel occlusion or significant stenosis.  Stroke work-up largely completed during previous admission: A1c 9.6, LDL 86, TSH 1.99, echo with LVEF 55 to 60%, normal LV function, moderately dilated LA.  Neurology consulted, and believed her symptoms were more functional than of organic cause. Medication regimen: Home losartan 100 mg p.o. daily, amlodipine 5 mg daily, Coreg 12.5 mg twice daily with meals (held throughout admission).  PT/OT recommending outpatient therapies. ?  ?#Epigastric pain ?On physical exam, pain primarily in the distribution of previous surgical scar, in which she had a hernia repair; worsened with palpation.  Pain into chest, RUQ, and RLQ consistent with referred pain, mildly tender to palpation.  Throughout admission, pain worsened in different areas. RUQ U/S unremarkable. Lipase is mildly elevated at 65. GGT 28.  Pain most likely of MSK etiology as reproducible with palpation but an element of fibromyalgia with shifting pain. Upon discharge, continue Protonix 40 mg daily, Sucralfate 1 g 3 times daily with meals and at bedtime, gabapentin 300 mg  nightly, and duloxetine 60 mg daily. Would advise patient to obtain OTC lidocaine patches. ?  ?#Generalized weakness ?#Recurrent falls ?#Bowel incontinence ?Patient uses a walker at baseline, and had multiple falls recently.  She reported weakness in her bilateral lower extremities that resolved by day of discharge per primary team, neurology, and PT/OT's assessments. MRI C and T-spine without source of bowel incontinence with decreased sphincter sensation and generalized weakness.  ?  ?#CKD stage IIIb ?Baseline creatinine ~ 1.7. Cr on day of discharge 1.97. Potassium repleted, and 3.9 by time of discharge.  ?  ?#Normocytic anemia ?Patient endorsed hematochezia on admission, resolved by PM 4/26.  Hgb on day of discharge 7.3.  Also with recent iron studies suggestive of IDA: Iron 15, TIBC 182, ferritin 395. Advise close outpatient follow-up.  Consider initiating iron supplementation outpatient. ?  ?#UA Abnormalities ?UA with glucosuria (chronic), small hemoglobin, significant proteinuria, trace leukocytes, greater than 50 white blood cells, and many bacteria.  Patient denied any urinary symptoms throughout admission. ?  ?#HFpEF ?Echocardiogram last hospitalization with EF 55-60%.  BNP 335.2 , decreased from 6 months ago.  No edema appreciated in  BLE throughout admission; did not appear volume overloaded. Chest x-ray was also unremarkable for any acute cardiopulmonary processes. ?  ?#Diabetes mellitus type 2, insulin-dependent, uncontrolled with hyperglycemia ?Most recent A1c of 9.6 about 2 weeks ago.  Home regimen: SSI.  Since glucose of 596 on admission, followed by 10 units NovoLog, subsequent CBGs 147-279.  Upon discharge, continue 10 units Semglee and home SSI. ?  ?#Hyperlipidemia ?Home crestor 10 mg continued throughout admission. ? ?Discharge Subjective: ?Patient with abdominal pain requiring PRN oxycodone x1 overnight. ?  ?This AM, patient reports continues to report significant generalized abdominal and chest  pain, primarily located in her R chest wall.  The character of the pain is unchanged from yesterday's description. As well, she has nonspecific numbness over left breast. Patient reports incontinent episodes of d

## 2021-11-23 NOTE — Progress Notes (Signed)
Inpatient Diabetes Program Recommendations ? ?AACE/ADA: New Consensus Statement on Inpatient Glycemic Control  ? ?Target Ranges:  Prepandial:   less than 140 mg/dL ?     Peak postprandial:   less than 180 mg/dL (1-2 hours) ?     Critically ill patients:  140 - 180 mg/dL  ? ? Latest Reference Range & Units 11/23/21 07:36  ?Glucose 70 - 99 mg/dL 61 (L)  ? ? Latest Reference Range & Units 11/23/21 06:00 11/23/21 12:10  ?Glucose-Capillary 70 - 99 mg/dL 112 (H) 109 (H)  ? ? Latest Reference Range & Units 11/22/21 10:57 11/22/21 12:23 11/22/21 16:27 11/22/21 21:10  ?Glucose-Capillary 70 - 99 mg/dL 279 (H) 283 (H) 182 (H) 254 (H)  ? ?Review of Glycemic Control ? ?Diabetes history: DM2 ?Outpatient Diabetes medications: 70/30 10 units BID ?Current orders for Inpatient glycemic control: Novolog 0-15 units TID with meals; Semglee 10 units QHS ? ?Inpatient Diabetes Program Recommendations:   ? ?Insulin: Lab glucose 61 mg/dl at 7:36 am today. May want to consider decreasing Semglee to 8 units QHS. ? ?Thanks, ?Barnie Alderman, RN, MSN, CDE ?Diabetes Coordinator ?Inpatient Diabetes Program ?[phone] (Team Pager from 8am to 5pm) ? ? ? ? ?

## 2021-11-23 NOTE — Progress Notes (Signed)
Pt c/o 9/10 RUQ pain.  Pt offered tylenol, pt refused.  States tylenol isnt effective.  Advised pt that RN would page doctor.  Dr. Curly Rim, waiting return call. ?

## 2021-11-23 NOTE — Progress Notes (Signed)
Occupational Therapy Treatment ?Patient Details ?Name: Amber Cross ?MRN: [id number] ?DOB: 11/04/1960 ?Today's Date: 11/23/2021 ? ? ?History of present illness Pt is a 61 y.o. female who presented 11/21/21 with chest and epigastric/RUQ pain, SOB, hematochezia, left-sided weakness and numbness. Work-up so far is not remarkable for ACS. 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  PMHx of T2DM, HTN, HLD, tobacco abuse, anxiety, HFpEF (EF 55-60% in April 2023), CKD stage IIIb, and a recent admission at [facility] for noncardiac chest pain thought to be of MSK etiology ?  ?OT comments ? Patient continues to make steady progress towards goals in skilled OT session. Patient seen BID to determine if patient can go home with outpatient OT, patient with improved bed mobility and overall functional movement, but continues to demonstrate deficitis in LUE coordination. Patient demonstrating good mentation and anticipatory awareness, and is planning on staying with her sister for a few days and then has family who will stay with her in order to ensure safety. OT printing off energy conservation sheet at end of session and providing it to patient for increased safety awareness with patient in agreement. OT recommendation updated to outpatient OT in order to address deficits. OT will continue to follow.   ? ?Recommendations for follow up therapy are one component of a multi-disciplinary discharge planning process, led by the attending physician.  Recommendations may be updated based on patient status, additional functional criteria and insurance authorization. ?   ?Follow Up Recommendations ? Outpatient OT  ?  ?Assistance Recommended at Discharge Intermittent Supervision/Assistance  ?Patient can return home with the following ? A little help with walking and/or transfers;A little help with bathing/dressing/bathroom;Assistance with  cooking/housework;Direct supervision/assist for medications management;Assist for transportation;Direct supervision/assist for financial management;Help with stairs or ramp for entrance ?  ?Equipment Recommendations ? None recommended by OT  ?  ?Recommendations for Other Services   ? ?  ?Precautions / Restrictions Precautions ?Precautions: Fall;Other (comment) ?Precaution Comments: ataxic ?Restrictions ?Weight Bearing Restrictions: No  ? ? ?  ? ?Mobility Bed Mobility ?Overal bed mobility: Needs Assistance ?Bed Mobility: Supine to Sit, Sit to Supine ?  ?  ?Supine to sit: Supervision ?Sit to supine: Supervision ?  ?General bed mobility comments: able to complete from level bed surface ?  ? ?Transfers ?Overall transfer level: Needs assistance ?Equipment used: Rolling walker (2 wheels) ?Transfers: Sit to/from Stand ?Sit to Stand: Min guard ?  ?  ?  ?  ?  ?General transfer comment: MinA to power up to stand and steady with noted instability, no evidence of knee buckling but noted to rush through movement with cues for pacing due to increased instability. ?  ?  ?Balance Overall balance assessment: Needs assistance ?Sitting-balance support: No upper extremity supported, Feet supported, Single extremity supported ?Sitting balance-Leahy Scale: Fair ?  ?  ?Standing balance support: Bilateral upper extremity supported, During functional activity, Reliant on assistive device for balance ?Standing balance-Leahy Scale: Poor ?Standing balance comment: Reliant on RW and minA ?  ?  ?  ?  ?  ?  ?  ?  ?  ?  ?  ?   ? ?ADL either performed or assessed with clinical judgement  ? ?ADL Overall ADL's : Needs assistance/impaired ?  ?  ?  ?  ?  ?  ?  ?  ?  ?  ?  ?  ?  ?  ?  ?  ?  ?  ?  Functional mobility during ADLs: Minimal assistance;Cueing for safety;Cueing for sequencing;Rollator (4 wheels) ?General ADL Comments: Patient seen BID to determine if patient can go home with outpatient OT, patient with improved bed mobility and overall  functional movement, but continues to demonstrate deficitis in LUE coordination ?  ? ?Extremity/Trunk Assessment   ?  ?  ?  ?  ?  ? ?Vision   ?  ?  ?Perception   ?  ?Praxis   ?  ? ?Cognition Arousal/Alertness: Awake/alert ?Behavior During Therapy: [facility] for tasks assessed/performed ?Overall Cognitive Status: Within Functional Limits for tasks assessed ?  ?  ?  ?  ?  ?  ?  ?  ?  ?  ?  ?  ?  ?  ?  ?  ?General Comments: Continues to progress, demonstrates good anticipatory awareness, able to state months of the year backwards ?  ?  ?   ?Exercises   ? ?  ?Shoulder Instructions   ? ? ?  ?General Comments    ? ? ?Pertinent Vitals/ Pain       Pain Assessment ?Pain Assessment: No/denies pain ?Pain Score: 7  ?Pain Location: right upper stomach ?Pain Descriptors / Indicators: Discomfort ?Pain Intervention(s): Limited activity within patient's tolerance, Monitored during session, Repositioned ? ?Home Living   ?  ?  ?  ?  ?  ?  ?  ?  ?  ?  ?  ?  ?  ?  ?  ?  ?  ?  ? ?  ?Prior Functioning/Environment    ?  ?  ?  ?   ? ?Frequency ? Min 3X/week  ? ? ? ? ?  ?Progress Toward Goals ? ?OT Goals(current goals can now be found in the care plan section) ? Progress towards OT goals: Progressing toward goals ? ?Acute Rehab OT Goals ?Patient Stated Goal: to get home ?OT Goal Formulation: With patient ?Time For Goal Achievement: 12/07/21 ?Potential to Achieve Goals: Good  ?Plan Discharge plan needs to be updated   ? ?Co-evaluation ? ? ?   ?  ?  ?  ?  ? ?  ?AM-PAC OT "6 Clicks" Daily Activity     ?Outcome Measure ? ? Help from another person eating meals?: A Little ?Help from another person taking care of personal grooming?: A Little ?Help from another person toileting, which includes using toliet, bedpan, or urinal?: A Little ?Help from another person bathing (including washing, rinsing, drying)?: A Little ?Help from another person to put on and taking off regular upper body clothing?: A Little ?Help from another person to put on and taking off  regular lower body clothing?: A Little ?6 Click Score: 18 ? ?  ?End of Session   ? ?OT Visit Diagnosis: Unsteadiness on feet (R26.81);Other abnormalities of gait and mobility (R26.89);Muscle weakness (generalized) (M62.81);Ataxia, unspecified (R27.0);Pain ?  ?Activity Tolerance Patient tolerated treatment well ?  ?Patient Left in bed;with call bell/phone within reach ?  ?Nurse Communication Mobility status ?  ? ?   ? ?Time: 1340-1350 ?OT Time Calculation (min): 10 min ? ?Charges: OT General Charges ?$OT Visit: 1 Visit ?OT Treatments ?$Self Care/Home Management : 8-22 mins ? ?Corinne Ports E. Dorance Spink, OTR/L ?Acute Rehabilitation Services ?[phone] ?[phone]  ? ?Corinne Ports Dorrie Cocuzza ?11/23/2021, 2:07 PM ?

## 2021-11-23 NOTE — Progress Notes (Addendum)
Physical Therapy Treatment ?Patient Details ?Name: Amber Cross ?MRN: [id number] ?DOB: 03-Apr-1961 ?Today's Date: 11/23/2021 ? ? ?History of Present Illness Pt is a 61 y.o. female who presented 11/21/21 with chest and epigastric/RUQ pain, SOB, hematochezia, left-sided weakness and numbness. Work-up so far is not remarkable for ACS. 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  PMHx of T2DM, HTN, HLD, tobacco abuse, anxiety, HFpEF (EF 55-60% in April 2023), CKD stage IIIb, and a recent admission at [facility] for noncardiac chest pain thought to be of MSK etiology ? ?  ?PT Comments  ? ? Pt has made drastic progress in a short period of time, now only needing min guard assist for ambulating >/= 500 ft with a rollator or navigating at least x3 stairs with 1 handrail. She continues to display ataxia, balance deficits, and slight knee buckling, but this has all improved greatly to the point she is able to perform all functional mobility with only min guard assist for safety. She remains at risk for falls, supported by her DGI score of 19 this date (score of </= 19 is predictive of falls), and she was educated of this. Educated pt not to drive due to her coordination deficits and to have someone guard her at home with standing mobility for safety until her balance improves. She verbalized understanding of all education. Secondary to her significant improvement and good support at home (son will take off work and sisters can assist), updating d/c recs to [place] at a neuro clinic. Will continue to follow acutely. ?  ?Recommendations for follow up therapy are one component of a multi-disciplinary discharge planning process, led by the attending physician.  Recommendations may be updated based on patient status, additional functional criteria and insurance authorization. ? ?Follow Up Recommendations ? Outpatient PT (neuro) ?  ?  ?Assistance  Recommended at Discharge Frequent or constant Supervision/Assistance (initially)  ?Patient can return home with the following A little help with walking and/or transfers;A little help with bathing/dressing/bathroom;Assistance with cooking/housework;Assist for transportation;Help with stairs or ramp for entrance ?  ?Equipment Recommendations ? None recommended by PT  ?  ?Recommendations for Other Services   ? ? ?  ?Precautions / Restrictions Precautions ?Precautions: Fall;Other (comment) ?Precaution Comments: ataxic ?Restrictions ?Weight Bearing Restrictions: No  ?  ? ?Mobility ? Bed Mobility ?Overal bed mobility: Needs Assistance ?Bed Mobility: Supine to Sit, Sit to Supine ?  ?  ?Supine to sit: Supervision ?Sit to supine: Supervision ?  ?General bed mobility comments: Supervision for safety secondary to ataxia, but able to complete without assistance and bed flat ?  ? ?Transfers ?Overall transfer level: Needs assistance ?Equipment used: Rollator (4 wheels) ?Transfers: Sit to/from Stand ?Sit to Stand: Min guard ?  ?  ?  ?  ?  ?General transfer comment: Cues to push up with 1 UE on bed and 1 on rollator to control ataxia and improve stability, good success noted, only needing min guard assist for safety with x5 sit <> stand reps from EOB. Cues provided to focus on eccentric control to sit. ?  ? ?Ambulation/Gait ?Ambulation/Gait assistance: Min guard ?Gait Distance (Feet): 500 Feet ?Assistive device: Rollator (4 wheels) ?Gait Pattern/deviations: Step-through pattern, Decreased stride length, Knees buckling ?Gait velocity: reduced ?Gait velocity interpretation: 1.31 - 2.62 ft/sec, indicative of limited community ambulator ?  ?General Gait Details: Pt with less ataxia and knee buckling, only intermittently displaying partial knee buckling initially, but able to recover  without assistance and demo improved stability as distance progressed. No LOB, min guard for safety throughout. ? ? ?Stairs ?Stairs: Yes ?Stairs  assistance: Min guard ?Stair Management: One rail Right, One rail Left, Step to pattern, Forwards ?Number of Stairs: 3 ?General stair comments: Ascends with R rail leading up with L foot and descends with L rail leading down with L foot. Educated pt on improved ease with leading up with R and down with L due to L sided weakness. No LOB, min guard for safety ? ? ?Wheelchair Mobility ?  ? ?Modified Rankin (Stroke Patients Only) ?Modified Rankin (Stroke Patients Only) ?Pre-Morbid Rankin Score: Slight disability ?Modified Rankin: Moderately severe disability ? ? ?  ?Balance Overall balance assessment: Needs assistance ?Sitting-balance support: No upper extremity supported, Feet supported ?Sitting balance-Leahy Scale: Fair ?Sitting balance - Comments: Static sitting EOB with supervision for safety, no LOB but unsteadiness noted with dynamic arm movements. ?  ?Standing balance support: Bilateral upper extremity supported, No upper extremity supported, During functional activity ?Standing balance-Leahy Scale: Fair ?Standing balance comment: Able to stand to adjust depends without UE support or LOB but benefits from rollator for mobility ?  ?  ?  ?  ?  ?  ?  ?  ?Standardized Balance Assessment ?Standardized Balance Assessment : Dynamic Gait Index ?  ?Dynamic Gait Index ?Level Surface: Mild Impairment ?Change in Gait Speed: Normal ?Gait with Horizontal Head Turns: Normal ?Gait with Vertical Head Turns: Mild Impairment ?Gait and Pivot Turn: Mild Impairment ?Step Over Obstacle: Normal ?Step Around Obstacles: Normal ?Steps: Moderate Impairment ?Total Score: 19 ?  ? ?  ?Cognition Arousal/Alertness: Awake/alert ?Behavior During Therapy: [facility] for tasks assessed/performed ?Overall Cognitive Status: Within Functional Limits for tasks assessed ?  ?  ?  ?  ?  ?  ?  ?  ?  ?  ?  ?  ?  ?  ?  ?  ?General Comments: Continues to progress, demonstrates good anticipatory awareness ?  ?  ? ?  ?Exercises Other Exercises ?Other Exercises: sit <>  stand 5x from EOB, cuing for eccentric control ? ?  ?General Comments General Comments (skin integrity, edema, etc.): HR up to low 120s with activity; educated pt to have someone hold onto her to guard her initial few days until she is more stable and then can progress to supervision as long as is safe. She verbalized understanding; educated pt to not drive yet due to incoordination affecting reaction time and appropriate reactions when driving, she verbalized understanding ?  ?  ? ?Pertinent Vitals/Pain Pain Assessment ?Pain Assessment: Faces ?Faces Pain Scale: No hurt ?Pain Intervention(s): Monitored during session  ? ? ?Home Living   ?  ?  ?  ?  ?  ?  ?  ?  ?  ?   ?  ?Prior Function    ?  ?  ?   ? ?PT Goals (current goals can now be found in the care plan section) Acute Rehab PT Goals ?Patient Stated Goal: to go home ?PT Goal Formulation: With patient ?Time For Goal Achievement: 12/06/21 ?Potential to Achieve Goals: Good ?Progress towards PT goals: Progressing toward goals ? ?  ?Frequency ? ? ? Min 4X/week ? ? ? ?  ?PT Plan Discharge plan needs to be updated;Equipment recommendations need to be updated  ? ? ?Co-evaluation   ?  ?  ?  ?  ? ?  ?AM-PAC PT "6 Clicks" Mobility   ?Outcome Measure ? Help needed turning from your back  to your side while in a flat bed without using bedrails?: A Little ?Help needed moving from lying on your back to sitting on the side of a flat bed without using bedrails?: A Little ?Help needed moving to and from a bed to a chair (including a wheelchair)?: A Little ?Help needed standing up from a chair using your arms (e.g., wheelchair or bedside chair)?: A Little ?Help needed to walk in hospital room?: A Little ?Help needed climbing 3-5 steps with a railing? : A Little ?6 Click Score: 18 ? ?  ?End of Session Equipment Utilized During Treatment: Gait belt ?Activity Tolerance: Patient tolerated treatment well ?Patient left: in bed;with call bell/phone within reach;with bed alarm set ?  ?PT  Visit Diagnosis: Unsteadiness on feet (R26.81);Other abnormalities of gait and mobility (R26.89);Muscle weakness (generalized) (M62.81);Difficulty in walking, not elsewhere classified (R26.2);Other symptoms and signs

## 2021-11-23 NOTE — Progress Notes (Signed)
RN provided patient with verbal discharge instructions. Paper copy of discharge summary provided to patient. RN answered all questions. VSS at discharge. IV removed. Pt belongings sent with patient at discharge.RN & NT d/c patient via wheelchair to private vehicle. ?

## 2021-11-29 ENCOUNTER — Ambulatory Visit (INDEPENDENT_AMBULATORY_CARE_PROVIDER_SITE_OTHER): Payer: 59 | Admitting: Cardiology

## 2021-11-29 ENCOUNTER — Encounter: Payer: Self-pay | Admitting: Cardiology

## 2021-11-29 VITALS — BP 176/70 | HR 105 | Ht 68.0 in | Wt 116.8 lb

## 2021-11-29 DIAGNOSIS — K529 Noninfective gastroenteritis and colitis, unspecified: Secondary | ICD-10-CM | POA: Diagnosis not present

## 2021-11-29 DIAGNOSIS — I110 Hypertensive heart disease with heart failure: Secondary | ICD-10-CM | POA: Diagnosis not present

## 2021-11-29 DIAGNOSIS — I5032 Chronic diastolic (congestive) heart failure: Secondary | ICD-10-CM

## 2021-11-29 DIAGNOSIS — E785 Hyperlipidemia, unspecified: Secondary | ICD-10-CM

## 2021-11-29 DIAGNOSIS — Z79899 Other long term (current) drug therapy: Secondary | ICD-10-CM

## 2021-11-29 DIAGNOSIS — Z8673 Personal history of transient ischemic attack (TIA), and cerebral infarction without residual deficits: Secondary | ICD-10-CM

## 2021-11-29 DIAGNOSIS — I5022 Chronic systolic (congestive) heart failure: Secondary | ICD-10-CM

## 2021-11-29 LAB — BASIC METABOLIC PANEL
BUN/Creatinine Ratio: 16 (ref 9–23)
BUN: 29 mg/dL — ABNORMAL HIGH (ref 6–24)
CO2: 24 mmol/L (ref 20–29)
Calcium: 8.3 mg/dL — ABNORMAL LOW (ref 8.7–10.2)
Chloride: 106 mmol/L (ref 96–106)
Creatinine, Ser: 1.78 mg/dL — ABNORMAL HIGH (ref 0.57–1.00)
Glucose: 61 mg/dL — ABNORMAL LOW (ref 70–99)
Potassium: 3.6 mmol/L (ref 3.5–5.2)
Sodium: 140 mmol/L (ref 134–144)
eGFR: 32 mL/min/{1.73_m2} — ABNORMAL LOW (ref >59)

## 2021-11-29 LAB — MAGNESIUM: Magnesium: 1.8 mg/dL (ref 1.6–2.3)

## 2021-11-29 NOTE — Progress Notes (Signed)
?Cardiology Office Note:   ? ?Date:  11/30/2021  ? ?ID:  Amber Cross, DOB [birth year], MRN [id number] ? ?PCP:  [name], DO  ?Cardiologist:  Berniece Salines, DO  ?Electrophysiologist:  None  ? ?Referring MD: Dessa Phi, DO  ? ?" I am having some chest pains I recently had a stroke." ? ?History of Present Illness:   ? ?Amber Cross is a 61 y.o. female with a hx of hypertension, hyperlipidemia, diabetes mellitus, tobacco use, heart failure with depressed ejection fraction, CVA ? ?I saw the patient in October 2022 at that time she was status post hospitalization for acute on chronic diastolic heart failure.  During that visit we reviewed her hospitalization which included her echocardiogram, her medication change as well as her nuclear stress test that was done due to the concern for amyloidosis.  Her nuclear PYP was equivocal.  Her lab work did not show any evidence of AL amyloid. ? ?At that visit she appeared to be improving clinically on her diuretic.  We talked about the fact that her heart function may have been secondary to hypertensive heart disease.  I optimize her medication.  And she was supposed to follow-up in 12 weeks. ? ?She did not follow-up with me however had seen cardiology at [facility].  It appears on however visit on October 29, 2021 at [place] her losartan was changed to 100 mg daily, Crestor changed to 10 mg daily.  Prior to that visit both for [employer] as well as her potassium was stopped due to kidney failure. ? ?She was admitted at [facility] on November 21, 2021 and discharged on November 23, 2021 after she breeches it for severe hypertension and chest pain.  The MRI of her brain was positive for punctate acute/subacute acute white matter infarct left superior frontal gyrus.  Her troponins were flat there is no pursue of any ischemic evaluation.  She did have evidence of stage IIIb kidney disease with her baseline creatinine at 1.7. ? ?She is here today for follow-up visit she  is with her niece.  She denies any chest pain or shortness of breath.  She is just fatigued. ? ?She tells me she is transitioning her care back to [facility] because her insurance had changed.  Experiencing chronic diarrhea but other than that no other complaints she offers no other complaints at this time. ? ?Past Medical History:  ?Diagnosis Date  ? Diabetes mellitus   ? Hypercholesteremia   ? Hypertension   ? Tobacco abuse   ? ? ?Past Surgical History:  ?Procedure Laterality Date  ? ANTERIOR CRUCIATE LIGAMENT REPAIR Right   ? CATARACT EXTRACTION    ? OVARY SURGERY    ? RIGHT OOPHORECTOMY Right   ? SMALL INTESTINE SURGERY    ? TUBAL LIGATION    ? ? ?Current Medications: ?Current Meds  ?Medication Sig  ? acetaminophen (TYLENOL) 500 MG tablet Take 1,000 mg by mouth every 6 (six) hours as needed for mild pain.  ? albuterol (VENTOLIN HFA) 108 (90 Base) MCG/ACT inhaler Inhale 2 puffs into the lungs every 6 (six) hours as needed for wheezing or shortness of breath.  ? amLODipine (NORVASC) 5 MG tablet Take 1 tablet (5 mg total) by mouth daily.  ? aspirin EC 81 MG EC tablet Take 1 tablet (81 mg total) by mouth daily. Swallow whole.  ? Bayer Microlet Lancets lancets Check blood sugar up to 3 times a day as instructed  ? busPIRone (BUSPAR) 5 MG tablet  Take 5 mg by mouth 2 (two) times daily.  ? carvedilol (COREG) 25 MG tablet Take 50 mg by mouth 2 (two) times daily with a meal.  ? Cholecalciferol (VITAMIN D) 50 MCG (2000 UT) tablet Take 2,000 Units by mouth daily.  ? DULoxetine (CYMBALTA) 60 MG capsule Take 1 capsule (60 mg total) by mouth daily.  ? furosemide (LASIX) 40 MG tablet Take 40 mg by mouth daily as needed for edema or fluid (swelling).  ? gabapentin (NEURONTIN) 300 MG capsule Take 1 capsule (300 mg total) by mouth daily.  ? glucose blood test strip Check blood sugar up to 3 times a day as instructed  ? insulin isophane & regular human KwikPen (NOVOLIN 70/30 KWIKPEN) (70-30) 100 UNIT/ML KwikPen Inject 10 Units  into the skin 2 (two) times daily before lunch and supper.  ? losartan (COZAAR) 100 MG tablet Take 100 mg by mouth daily.  ? mirtazapine (REMERON) 15 MG tablet Take 15 mg by mouth at bedtime.  ? Multiple Vitamin (MULTI-VITAMIN) tablet Take 1 tablet by mouth daily.  ? nitroGLYCERIN (NITROSTAT) 0.4 MG SL tablet Place 0.4 mg under the tongue every 5 (five) minutes as needed for chest pain.  ? rosuvastatin (CRESTOR) 40 MG tablet Take 1 tablet (40 mg total) by mouth daily.  ? sucralfate (CARAFATE) 1 g tablet Take 1 tablet (1 g total) by mouth 4 (four) times daily -  with meals and at bedtime.  ?  ? ?Allergies:   Penicillins, Strawberry (diagnostic), and Tomato  ? ?Social History  ? ?Socioeconomic History  ? Marital status: Married  ?  Spouse name: Not on file  ? Number of children: Not on file  ? Years of education: Not on file  ? Highest education level: Not on file  ?Occupational History  ? Not on file  ?Tobacco Use  ? Smoking status: Every Day  ?  Packs/day: 0.50  ?  Years: 40.00  ?  Pack years: 20.00  ?  Types: Cigarettes  ? Smokeless tobacco: Never  ?Vaping Use  ? Vaping Use: Never used  ?Substance and Sexual Activity  ? Alcohol use: Not Currently  ?  Alcohol/week: 1.0 standard drink  ?  Types: 1 Glasses of wine per week  ? Drug use: No  ? Sexual activity: Not Currently  ?  Birth control/protection: Abstinence  ?Other Topics Concern  ? Not on file  ?Social History Narrative  ? Not on file  ? ?Social Determinants of Health  ? ?Financial Resource Strain: Not on file  ?Food Insecurity: Not on file  ?Transportation Needs: Not on file  ?Physical Activity: Not on file  ?Stress: Not on file  ?Social Connections: Not on file  ?  ? ?Family History: ?The patient's family history includes Diabetes in her mother; Hypertension in her father and mother. ? ?ROS:   ?Review of Systems  ?Constitution: Negative for decreased appetite, fever and weight gain.  ?HENT: Negative for congestion, ear discharge, hoarse voice and sore throat.    ?Eyes: Negative for discharge, redness, vision loss in right eye and visual halos.  ?Cardiovascular: Negative for chest pain, dyspnea on exertion, leg swelling, orthopnea and palpitations.  ?Respiratory: Negative for cough, hemoptysis, shortness of breath and snoring.   ?Endocrine: Negative for heat intolerance and polyphagia.  ?Hematologic/Lymphatic: Negative for bleeding problem. Does not bruise/bleed easily.  ?Skin: Negative for flushing, nail changes, rash and suspicious lesions.  ?Musculoskeletal: Negative for arthritis, joint pain, muscle cramps, myalgias, neck pain and stiffness.  ?Gastrointestinal: Negative  for abdominal pain, bowel incontinence, diarrhea and excessive appetite.  ?Genitourinary: Negative for decreased libido, genital sores and incomplete emptying.  ?Neurological: Negative for brief paralysis, focal weakness, headaches and loss of balance.  ?Psychiatric/Behavioral: Negative for altered mental status, depression and suicidal ideas.  ?Allergic/Immunologic: Negative for HIV exposure and persistent infections.  ? ? ?EKGs/Labs/Other Studies Reviewed:   ? ?The following studies were reviewed today: ? ? ?EKG:  The ekg ordered today demonstrates  ? ?TTE 11/08/2021 IMPRESSIONS  ? 1. The LV endocardium has a bright speckled appearence.  ? 2. The patient is followed at [facility] . Cardiac MRI 09/21/21 at [place] revealed no evidence of amyloidosis.  ? 3. There is severe concentric LVH with diastolic dysfunction and small  ?pericardial effusion.  ? 4. Left ventricular ejection fraction, by estimation, is 55 to 60%. The  ?left ventricle has normal function. The left ventricle has no regional  ?wall motion abnormalities. There is severe concentric left ventricular  ?hypertrophy. Left ventricular diastolic  ? parameters are consistent with Grade I diastolic dysfunction (impaired  ?relaxation).  ? 5. Right ventricular systolic function is normal. The right ventricular  ?size is normal. There is normal  pulmonary artery systolic pressure.  ? 6. Left atrial size was moderately dilated.  ? 7. There is no evidence of cardiac tamponade.  ? 8. The mitral valve is normal in structure. Trivial mitral valve

## 2021-11-29 NOTE — Patient Instructions (Addendum)
Medication Instructions:  ?Your physician recommends that you continue on your current medications as directed. Please refer to the Current Medication list given to you today.  ?Please take your blood pressure daily for 2 weeks and send in a MyChart message. Please include heart rates.  ? ?HOW TO TAKE YOUR BLOOD PRESSURE: ?Rest 5 minutes before taking your blood pressure. ?Don?t smoke or drink caffeinated beverages for at least 30 minutes before. ?Take your blood pressure before (not after) you eat. ?Sit comfortably with your back supported and both feet on the floor (don?t cross your legs). ?Elevate your arm to heart level on a table or a desk. ?Use the proper sized cuff. It should fit smoothly and snugly around your bare upper arm. There should be enough room to slip a fingertip under the cuff. The bottom edge of the cuff should be 1 inch above the crease of the elbow. ?Ideally, take 3 measurements at one sitting and record the average. ? ?*If you need a refill on your cardiac medications before your next appointment, please call your pharmacy* ? ? ?Lab Work: ?Your physician recommends that you return for lab work in:  ?TODAY: BMET, [place] ?If you have labs (blood work) drawn today and your tests are completely normal, you will receive your results only by: ?MyChart Message (if you have MyChart) OR ?A paper copy in the mail ?If you have any lab test that is abnormal or we need to change your treatment, we will call you to review the results. ? ? ?Testing/Procedures: ?None ? ? ?Follow-Up: ?At [facility], you and your health needs are our priority.  As part of our continuing mission to provide you with exceptional heart care, we have created designated Provider Care Teams.  These Care Teams include your primary Cardiologist (physician) and Advanced Practice Providers (APPs -  Physician Assistants and Nurse Practitioners) who all work together to provide you with the care you need, when you need it. ? ?We recommend  signing up for the patient portal called "MyChart".  Sign up information is provided on this After Visit Summary.  MyChart is used to connect with patients for Virtual Visits (Telemedicine).  Patients are able to view lab/test results, encounter notes, upcoming appointments, etc.  Non-urgent messages can be sent to your provider as well.   ?To learn more about what you can do with MyChart, go to [email or web address].   ? ?Your next appointment:   ?6 week(s) ? ?The format for your next appointment:   ?In Person ? ?Provider:   ?Berniece Salines, DO   ? ? ?Other Instructions ? ? ?Important Information About Sugar ? ? ? ? ?  ?

## 2021-11-30 DIAGNOSIS — Z79899 Other long term (current) drug therapy: Secondary | ICD-10-CM | POA: Insufficient documentation

## 2021-11-30 DIAGNOSIS — Z8673 Personal history of transient ischemic attack (TIA), and cerebral infarction without residual deficits: Secondary | ICD-10-CM | POA: Insufficient documentation

## 2021-11-30 DIAGNOSIS — K529 Noninfective gastroenteritis and colitis, unspecified: Secondary | ICD-10-CM | POA: Insufficient documentation

## 2021-11-30 DIAGNOSIS — Z0000 Encounter for general adult medical examination without abnormal findings: Secondary | ICD-10-CM | POA: Insufficient documentation

## 2021-12-03 ENCOUNTER — Encounter: Payer: Self-pay | Admitting: Pulmonary Disease

## 2021-12-03 ENCOUNTER — Ambulatory Visit: Payer: 59 | Admitting: Pulmonary Disease

## 2021-12-03 VITALS — BP 140/62 | HR 77 | Temp 98.5°F | Ht 67.5 in | Wt 124.8 lb

## 2021-12-03 DIAGNOSIS — N1832 Chronic kidney disease, stage 3b: Secondary | ICD-10-CM

## 2021-12-03 DIAGNOSIS — R634 Abnormal weight loss: Secondary | ICD-10-CM

## 2021-12-03 DIAGNOSIS — R911 Solitary pulmonary nodule: Secondary | ICD-10-CM | POA: Diagnosis not present

## 2021-12-03 DIAGNOSIS — Z720 Tobacco use: Secondary | ICD-10-CM

## 2021-12-03 DIAGNOSIS — Z8673 Personal history of transient ischemic attack (TIA), and cerebral infarction without residual deficits: Secondary | ICD-10-CM

## 2021-12-03 DIAGNOSIS — I5022 Chronic systolic (congestive) heart failure: Secondary | ICD-10-CM | POA: Diagnosis not present

## 2021-12-03 NOTE — Patient Instructions (Signed)
Thank you for visiting Dr. Valeta Harms at [facility]. ?Today we recommend the following: ? ?Orders Placed This Encounter  ?Procedures  ? Procedural/ Surgical Case Request: ROBOTIC ASSISTED NAVIGATIONAL BRONCHOSCOPY  ? NM PET Image Initial (PI) Skull Base To Thigh (F-18 FDG)  ? Ambulatory referral to Pulmonology  ? ?Bronchoscopy on 12/18/2021 ? ?Return in about 22 days (around 12/25/2021) for with Eric Form, NP. ? ? ? ?Please do your part to reduce the spread of COVID-19.  ? ?

## 2021-12-03 NOTE — H&P (View-Only) (Signed)
Synopsis: Referred in May 2023 for lung nodule by No ref. provider found  Subjective:   PATIENT ID: Amber Cross GENDER: female DOB: 05-14-1961, MRN: [id number]  Chief Complaint  Patient presents with   Hospitalization Follow-up    No complaints with breathing.  A bit SOB post-hospital    This is a 61 year old female, past medical history of diabetes, hypercholesterolemia, hypertension, tobacco abuse.Patient was recently just seen in the hospital.  Was discharged from the hospital.  Discharge summary from 11/23/2021 reviewed.  Had a stroke, hypertension.  Baseline CKD 3B.  She was sent today for follow-up after a CT chest completed on 11/06/2021.  This was done for a 9 mm subpleural nodule within the right upper lobe.  She had a CT scan of the chest completed on 11/06/2021.  CT results reviewed today in the office with patient.   Past Medical History:  Diagnosis Date   Diabetes mellitus    Hypercholesteremia    Hypertension    Tobacco abuse      Family History  Problem Relation Age of Onset   Diabetes Mother    Hypertension Mother    Hypertension Father      Past Surgical History:  Procedure Laterality Date   ANTERIOR CRUCIATE LIGAMENT REPAIR Right    CATARACT EXTRACTION     OVARY SURGERY     RIGHT OOPHORECTOMY Right    SMALL INTESTINE SURGERY     TUBAL LIGATION      Social History   Socioeconomic History   Marital status: Married    Spouse name: Not on file   Number of children: Not on file   Years of education: Not on file   Highest education level: Not on file  Occupational History   Not on file  Tobacco Use   Smoking status: Every Day    Packs/day: 0.50    Years: 40.00    Pack years: 20.00    Types: Cigarettes   Smokeless tobacco: Never  Vaping Use   Vaping Use: Never used  Substance and Sexual Activity   Alcohol use: Not Currently    Alcohol/week: 1.0 standard drink    Types: 1 Glasses of wine per week   Drug use: No   Sexual activity: Not  Currently    Birth control/protection: Abstinence  Other Topics Concern   Not on file  Social History Narrative   Not on file   Social Determinants of Health   Financial Resource Strain: Not on file  Food Insecurity: Not on file  Transportation Needs: Not on file  Physical Activity: Not on file  Stress: Not on file  Social Connections: Not on file  Intimate Partner Violence: Not on file     Allergies  Allergen Reactions   Penicillins     itch   Strawberry (Diagnostic) Itching   Tomato Itching     Outpatient Medications Prior to Visit  Medication Sig Dispense Refill   acetaminophen (TYLENOL) 500 MG tablet Take 1,000 mg by mouth every 6 (six) hours as needed for mild pain.     albuterol (VENTOLIN HFA) 108 (90 Base) MCG/ACT inhaler Inhale 2 puffs into the lungs every 6 (six) hours as needed for wheezing or shortness of breath.     amLODipine (NORVASC) 5 MG tablet Take 1 tablet (5 mg total) by mouth daily. 30 tablet 1   aspirin EC 81 MG EC tablet Take 1 tablet (81 mg total) by mouth daily. Swallow whole. 30 tablet 11   Bayer Microlet Lancets  lancets Check blood sugar up to 3 times a day as instructed 100 each 12   busPIRone (BUSPAR) 5 MG tablet Take 5 mg by mouth 2 (two) times daily.     carvedilol (COREG) 25 MG tablet Take 50 mg by mouth 2 (two) times daily with a meal.     Cholecalciferol (VITAMIN D) 50 MCG (2000 UT) tablet Take 2,000 Units by mouth daily.     DULoxetine (CYMBALTA) 60 MG capsule Take 1 capsule (60 mg total) by mouth daily. 30 capsule 0   furosemide (LASIX) 40 MG tablet Take 40 mg by mouth daily as needed for edema or fluid (swelling).     gabapentin (NEURONTIN) 300 MG capsule Take 1 capsule (300 mg total) by mouth daily. 30 capsule 0   glucose blood test strip Check blood sugar up to 3 times a day as instructed 100 each 12   insulin isophane & regular human KwikPen (NOVOLIN 70/30 KWIKPEN) (70-30) 100 UNIT/ML KwikPen Inject 10 Units into the skin 2 (two) times  daily before lunch and supper. 6 mL 0   losartan (COZAAR) 100 MG tablet Take 100 mg by mouth daily.     mirtazapine (REMERON) 15 MG tablet Take 15 mg by mouth at bedtime.     Multiple Vitamin (MULTI-VITAMIN) tablet Take 1 tablet by mouth daily.     nitroGLYCERIN (NITROSTAT) 0.4 MG SL tablet Place 0.4 mg under the tongue every 5 (five) minutes as needed for chest pain.     rosuvastatin (CRESTOR) 40 MG tablet Take 1 tablet (40 mg total) by mouth daily. 30 tablet 0   sucralfate (CARAFATE) 1 g tablet Take 1 tablet (1 g total) by mouth 4 (four) times daily -  with meals and at bedtime. 60 tablet 0   No facility-administered medications prior to visit.    ROS   Objective:  Physical Exam   Vitals:   12/03/21 1420  BP: 140/62  Pulse: 77  Temp: 98.5 F (36.9 C)  SpO2: 99%  Weight: 124 lb 12.8 oz (56.6 kg)  Height: 5' 7.5" (1.715 m)   99% on RA BMI Readings from Last 3 Encounters:  12/03/21 19.26 kg/m  11/29/21 17.76 kg/m  11/22/21 19.09 kg/m   Wt Readings from Last 3 Encounters:  12/03/21 124 lb 12.8 oz (56.6 kg)  11/29/21 116 lb 12.8 oz (53 kg)  11/22/21 121 lb 14.6 oz (55.3 kg)     CBC    Component Value Date/Time   WBC 10.3 11/23/2021 0736   RBC 2.51 (L) 11/23/2021 0736   HGB 7.3 (L) 11/23/2021 0736   HGB 14.5 04/06/2019 1506   HCT 21.7 (L) 11/23/2021 0736   HCT 45.9 04/06/2019 1506   PLT 335 11/23/2021 0736   PLT 306 04/06/2019 1506   MCV 86.5 11/23/2021 0736   MCV 92 04/06/2019 1506   MCH 29.1 11/23/2021 0736   MCHC 33.6 11/23/2021 0736   RDW 14.2 11/23/2021 0736   RDW 13.0 04/06/2019 1506   LYMPHSABS 1.8 11/21/2021 1533   MONOABS 0.7 11/21/2021 1533   EOSABS 0.0 11/21/2021 1533   BASOSABS 0.0 11/21/2021 1533     Chest Imaging: 11/06/2021 CT chest: Right upper lobe lateral 9 mm lobulated lung nodule concerning for primary bronchogenic carcinoma. The patient's images have been independently reviewed by me.    Pulmonary Functions Testing Results:      View : No data to display.          FeNO:   Pathology:   Echocardiogram:  Heart Catheterization:     Assessment & Plan:     ICD-10-CM   1. Lung nodule  R91.1 Ambulatory referral to Pulmonology    Procedural/ Surgical Case Request: ROBOTIC ASSISTED NAVIGATIONAL BRONCHOSCOPY    NM PET Image Initial (PI) Skull Base To Thigh (F-18 FDG)    2. Tobacco abuse  Z72.0     3. Chronic systolic CHF (congestive heart failure) (HCC)  I50.22     4. Stage 3b chronic kidney disease (CKD) (HCC)  N18.32     5. History of CVA (cerebrovascular accident)  Z86.73     6. Weight loss  R63.4       Discussion:  This is a 61 year old female longstanding history of tobacco abuse, smokes since she was a kid, still smoking half a pack to 1 pack/day.  She has other multiple medical comorbidities to include stroke, chronic systolic heart failure, CKD.  She is also very thin and has continued to lose weight.  She has had multiple hospitalizations recently.  Plan: Her lung nodule within the right upper lobe is highly concerning for primary bronchogenic carcinoma. It does appear that it is nowhere else in the chest. We would like to go ahead and plan schedule for robotic assisted navigational bronchoscopy with tissue sampling. We talked about the risk benefits alternatives today in the office. Patient is agreeable to proceed. We will also need a nuclear medicine pet image. I think we can go ahead and schedule her case out for a couple of weeks from now and have the PET scan completed in between. Even despite the PET scan results I expect that we will need to consider tissue sampling as this lesion is highly concerning for malignancy although it is small in size. We talked about the risk of bleeding and pneumothorax. Tentative bronchoscopy date will be 12/18/2021. I have placed order for pet imaging. Patient to follow-up with [place] approximately 1 week afterwards.   Current Outpatient Medications:     acetaminophen (TYLENOL) 500 MG tablet, Take 1,000 mg by mouth every 6 (six) hours as needed for mild pain., Disp: , Rfl:    albuterol (VENTOLIN HFA) 108 (90 Base) MCG/ACT inhaler, Inhale 2 puffs into the lungs every 6 (six) hours as needed for wheezing or shortness of breath., Disp: , Rfl:    amLODipine (NORVASC) 5 MG tablet, Take 1 tablet (5 mg total) by mouth daily., Disp: 30 tablet, Rfl: 1   aspirin EC 81 MG EC tablet, Take 1 tablet (81 mg total) by mouth daily. Swallow whole., Disp: 30 tablet, Rfl: 11   Bayer Microlet Lancets lancets, Check blood sugar up to 3 times a day as instructed, Disp: 100 each, Rfl: 12   busPIRone (BUSPAR) 5 MG tablet, Take 5 mg by mouth 2 (two) times daily., Disp: , Rfl:    carvedilol (COREG) 25 MG tablet, Take 50 mg by mouth 2 (two) times daily with a meal., Disp: , Rfl:    Cholecalciferol (VITAMIN D) 50 MCG (2000 UT) tablet, Take 2,000 Units by mouth daily., Disp: , Rfl:    DULoxetine (CYMBALTA) 60 MG capsule, Take 1 capsule (60 mg total) by mouth daily., Disp: 30 capsule, Rfl: 0   furosemide (LASIX) 40 MG tablet, Take 40 mg by mouth daily as needed for edema or fluid (swelling)., Disp: , Rfl:    gabapentin (NEURONTIN) 300 MG capsule, Take 1 capsule (300 mg total) by mouth daily., Disp: 30 capsule, Rfl: 0   glucose blood test strip, Check blood sugar up  to 3 times a day as instructed, Disp: 100 each, Rfl: 12   insulin isophane & regular human KwikPen (NOVOLIN 70/30 KWIKPEN) (70-30) 100 UNIT/ML KwikPen, Inject 10 Units into the skin 2 (two) times daily before lunch and supper., Disp: 6 mL, Rfl: 0   losartan (COZAAR) 100 MG tablet, Take 100 mg by mouth daily., Disp: , Rfl:    mirtazapine (REMERON) 15 MG tablet, Take 15 mg by mouth at bedtime., Disp: , Rfl:    Multiple Vitamin (MULTI-VITAMIN) tablet, Take 1 tablet by mouth daily., Disp: , Rfl:    nitroGLYCERIN (NITROSTAT) 0.4 MG SL tablet, Place 0.4 mg under the tongue every 5 (five) minutes as needed for chest pain.,  Disp: , Rfl:    rosuvastatin (CRESTOR) 40 MG tablet, Take 1 tablet (40 mg total) by mouth daily., Disp: 30 tablet, Rfl: 0   sucralfate (CARAFATE) 1 g tablet, Take 1 tablet (1 g total) by mouth 4 (four) times daily -  with meals and at bedtime., Disp: 60 tablet, Rfl: 0  I spent 63 minutes dedicated to the care of this patient on the date of this encounter to include pre-visit review of records, face-to-face time with the patient discussing conditions above, post visit ordering of testing, clinical documentation with the electronic health record, making appropriate referrals as documented, and communicating necessary findings to members of the patients care team.   Garner Nash, DO [facility] 12/03/2021 3:09 PM

## 2021-12-03 NOTE — Progress Notes (Signed)
? ?Synopsis: Referred in May 2023 for lung nodule by No ref. provider found ? ?Subjective:  ? ?PATIENT ID: Amber Cross GENDER: female DOB: 1960-10-20, MRN: [id number] ? ?Chief Complaint  ?Patient presents with  ? Hospitalization Follow-up  ?  No complaints with breathing.  A bit SOB post-hospital  ? ? ?This is a 61 year old female, past medical history of diabetes, hypercholesterolemia, hypertension, tobacco abuse.Patient was recently just seen in the hospital.  Was discharged from the hospital.  Discharge summary from 11/23/2021 reviewed.  Had a stroke, hypertension.  Baseline CKD 3B.  She was sent today for follow-up after a CT chest completed on 11/06/2021.  This was done for a 9 mm subpleural nodule within the right upper lobe.  She had a CT scan of the chest completed on 11/06/2021.  CT results reviewed today in the office with patient. ? ? ?Past Medical History:  ?Diagnosis Date  ? Diabetes mellitus   ? Hypercholesteremia   ? Hypertension   ? Tobacco abuse   ?  ? ?Family History  ?Problem Relation Age of Onset  ? Diabetes Mother   ? Hypertension Mother   ? Hypertension Father   ?  ? ?Past Surgical History:  ?Procedure Laterality Date  ? ANTERIOR CRUCIATE LIGAMENT REPAIR Right   ? CATARACT EXTRACTION    ? OVARY SURGERY    ? RIGHT OOPHORECTOMY Right   ? SMALL INTESTINE SURGERY    ? TUBAL LIGATION    ? ? ?Social History  ? ?Socioeconomic History  ? Marital status: Married  ?  Spouse name: Not on file  ? Number of children: Not on file  ? Years of education: Not on file  ? Highest education level: Not on file  ?Occupational History  ? Not on file  ?Tobacco Use  ? Smoking status: Every Day  ?  Packs/day: 0.50  ?  Years: 40.00  ?  Pack years: 20.00  ?  Types: Cigarettes  ? Smokeless tobacco: Never  ?Vaping Use  ? Vaping Use: Never used  ?Substance and Sexual Activity  ? Alcohol use: Not Currently  ?  Alcohol/week: 1.0 standard drink  ?  Types: 1 Glasses of wine per week  ? Drug use: No  ? Sexual activity: Not  Currently  ?  Birth control/protection: Abstinence  ?Other Topics Concern  ? Not on file  ?Social History Narrative  ? Not on file  ? ?Social Determinants of Health  ? ?Financial Resource Strain: Not on file  ?Food Insecurity: Not on file  ?Transportation Needs: Not on file  ?Physical Activity: Not on file  ?Stress: Not on file  ?Social Connections: Not on file  ?Intimate Partner Violence: Not on file  ?  ? ?Allergies  ?Allergen Reactions  ? Penicillins   ?  itch  ? Strawberry (Diagnostic) Itching  ? Tomato Itching  ?  ? ?Outpatient Medications Prior to Visit  ?Medication Sig Dispense Refill  ? acetaminophen (TYLENOL) 500 MG tablet Take 1,000 mg by mouth every 6 (six) hours as needed for mild pain.    ? albuterol (VENTOLIN HFA) 108 (90 Base) MCG/ACT inhaler Inhale 2 puffs into the lungs every 6 (six) hours as needed for wheezing or shortness of breath.    ? amLODipine (NORVASC) 5 MG tablet Take 1 tablet (5 mg total) by mouth daily. 30 tablet 1  ? aspirin EC 81 MG EC tablet Take 1 tablet (81 mg total) by mouth daily. Swallow whole. 30 tablet 11  ? Haematologist  lancets Check blood sugar up to 3 times a day as instructed 100 each 12  ? busPIRone (BUSPAR) 5 MG tablet Take 5 mg by mouth 2 (two) times daily.    ? carvedilol (COREG) 25 MG tablet Take 50 mg by mouth 2 (two) times daily with a meal.    ? Cholecalciferol (VITAMIN D) 50 MCG (2000 UT) tablet Take 2,000 Units by mouth daily.    ? DULoxetine (CYMBALTA) 60 MG capsule Take 1 capsule (60 mg total) by mouth daily. 30 capsule 0  ? furosemide (LASIX) 40 MG tablet Take 40 mg by mouth daily as needed for edema or fluid (swelling).    ? gabapentin (NEURONTIN) 300 MG capsule Take 1 capsule (300 mg total) by mouth daily. 30 capsule 0  ? glucose blood test strip Check blood sugar up to 3 times a day as instructed 100 each 12  ? insulin isophane & regular human KwikPen (NOVOLIN 70/30 KWIKPEN) (70-30) 100 UNIT/ML KwikPen Inject 10 Units into the skin 2 (two) times  daily before lunch and supper. 6 mL 0  ? losartan (COZAAR) 100 MG tablet Take 100 mg by mouth daily.    ? mirtazapine (REMERON) 15 MG tablet Take 15 mg by mouth at bedtime.    ? Multiple Vitamin (MULTI-VITAMIN) tablet Take 1 tablet by mouth daily.    ? nitroGLYCERIN (NITROSTAT) 0.4 MG SL tablet Place 0.4 mg under the tongue every 5 (five) minutes as needed for chest pain.    ? rosuvastatin (CRESTOR) 40 MG tablet Take 1 tablet (40 mg total) by mouth daily. 30 tablet 0  ? sucralfate (CARAFATE) 1 g tablet Take 1 tablet (1 g total) by mouth 4 (four) times daily -  with meals and at bedtime. 60 tablet 0  ? ?No facility-administered medications prior to visit.  ? ? ?ROS ? ? ?Objective:  ?Physical Exam ? ? ?Vitals:  ? 12/03/21 1420  ?BP: 140/62  ?Pulse: 77  ?Temp: 98.5 ?F (36.9 ?C)  ?SpO2: 99%  ?Weight: 124 lb 12.8 oz (56.6 kg)  ?Height: 5' 7.5" (1.715 m)  ? ?99% on RA ?BMI Readings from Last 3 Encounters:  ?12/03/21 19.26 kg/m?  ?11/29/21 17.76 kg/m?  ?11/22/21 19.09 kg/m?  ? ?Wt Readings from Last 3 Encounters:  ?12/03/21 124 lb 12.8 oz (56.6 kg)  ?11/29/21 116 lb 12.8 oz (53 kg)  ?11/22/21 121 lb 14.6 oz (55.3 kg)  ? ? ? ?CBC ?   ?Component Value Date/Time  ? WBC 10.3 11/23/2021 0736  ? RBC 2.51 (L) 11/23/2021 0736  ? HGB 7.3 (L) 11/23/2021 0736  ? HGB 14.5 04/06/2019 1506  ? HCT 21.7 (L) 11/23/2021 0736  ? HCT 45.9 04/06/2019 1506  ? PLT 335 11/23/2021 0736  ? PLT 306 04/06/2019 1506  ? MCV 86.5 11/23/2021 0736  ? MCV 92 04/06/2019 1506  ? MCH 29.1 11/23/2021 0736  ? MCHC 33.6 11/23/2021 0736  ? RDW 14.2 11/23/2021 0736  ? RDW 13.0 04/06/2019 1506  ? LYMPHSABS 1.8 11/21/2021 1533  ? MONOABS 0.7 11/21/2021 1533  ? EOSABS 0.0 11/21/2021 1533  ? BASOSABS 0.0 11/21/2021 1533  ? ? ? ?Chest Imaging: ?11/06/2021 CT chest: ?Right upper lobe lateral 9 mm lobulated lung nodule concerning for primary bronchogenic carcinoma. ?The patient's images have been independently reviewed by me.   ? ?Pulmonary Functions Testing Results: ?    ? View : No data to display.  ?  ?  ?  ? ? ?FeNO:  ? ?Pathology:  ? ?Echocardiogram:  ? ?  Heart Catheterization:  ?   ?Assessment & Plan:  ? ?  ICD-10-CM   ?1. Lung nodule  R91.1 Ambulatory referral to Pulmonology  ?  Procedural/ Surgical Case Request: ROBOTIC ASSISTED NAVIGATIONAL BRONCHOSCOPY  ?  NM PET Image Initial (PI) Skull Base To Thigh (F-18 FDG)  ?  ?2. Tobacco abuse  Z72.0   ?  ?3. Chronic systolic CHF (congestive heart failure) (HCC)  I50.22   ?  ?4. Stage 3b chronic kidney disease (CKD) (HCC)  N18.32   ?  ?5. History of CVA (cerebrovascular accident)  Z69.73   ?  ?6. Weight loss  R63.4   ?  ? ? ?Discussion: ? ?This is a 61 year old female longstanding history of tobacco abuse, smokes since she was a kid, still smoking half a pack to 1 pack/day.  She has other multiple medical comorbidities to include stroke, chronic systolic heart failure, CKD.  She is also very thin and has continued to lose weight.  She has had multiple hospitalizations recently. ? ?Plan: ?Her lung nodule within the right upper lobe is highly concerning for primary bronchogenic carcinoma. ?It does appear that it is nowhere else in the chest. ?We would like to go ahead and plan schedule for robotic assisted navigational bronchoscopy with tissue sampling. ?We talked about the risk benefits alternatives today in the office. ?Patient is agreeable to proceed. ?We will also need a nuclear medicine pet image. ?I think we can go ahead and schedule her case out for a couple of weeks from now and have the PET scan completed in between. ?Even despite the PET scan results I expect that we will need to consider tissue sampling as this lesion is highly concerning for malignancy although it is small in size. ?We talked about the risk of bleeding and pneumothorax. ?Tentative bronchoscopy date will be 12/18/2021. ?I have placed order for pet imaging. ?Patient to follow-up with [place] approximately 1 week afterwards. ? ? ?Current Outpatient Medications:  ?   acetaminophen (TYLENOL) 500 MG tablet, Take 1,000 mg by mouth every 6 (six) hours as needed for mild pain., Disp: , Rfl:  ?  albuterol (VENTOLIN HFA) 108 (90 Base) MCG/ACT inhaler, Inhale 2 puffs into the lung

## 2021-12-07 NOTE — Progress Notes (Signed)
Internal Medicine Clinic Attending  Case discussed with Dr. Nguyen  At the time of the visit.  We reviewed the resident's history and exam and pertinent patient test results.  I agree with the assessment, diagnosis, and plan of care documented in the resident's note. 

## 2021-12-11 ENCOUNTER — Other Ambulatory Visit (HOSPITAL_COMMUNITY): Payer: Self-pay

## 2021-12-11 ENCOUNTER — Ambulatory Visit (INDEPENDENT_AMBULATORY_CARE_PROVIDER_SITE_OTHER): Payer: 59 | Admitting: Student

## 2021-12-11 VITALS — BP 137/60 | HR 72 | Temp 98.5°F | Ht 67.5 in | Wt 129.9 lb

## 2021-12-11 DIAGNOSIS — E1142 Type 2 diabetes mellitus with diabetic polyneuropathy: Secondary | ICD-10-CM

## 2021-12-11 DIAGNOSIS — D649 Anemia, unspecified: Secondary | ICD-10-CM

## 2021-12-11 DIAGNOSIS — R1013 Epigastric pain: Secondary | ICD-10-CM

## 2021-12-11 DIAGNOSIS — R079 Chest pain, unspecified: Secondary | ICD-10-CM

## 2021-12-11 DIAGNOSIS — N1832 Chronic kidney disease, stage 3b: Secondary | ICD-10-CM

## 2021-12-11 DIAGNOSIS — E1122 Type 2 diabetes mellitus with diabetic chronic kidney disease: Secondary | ICD-10-CM

## 2021-12-11 DIAGNOSIS — I130 Hypertensive heart and chronic kidney disease with heart failure and stage 1 through stage 4 chronic kidney disease, or unspecified chronic kidney disease: Secondary | ICD-10-CM

## 2021-12-11 DIAGNOSIS — F17210 Nicotine dependence, cigarettes, uncomplicated: Secondary | ICD-10-CM

## 2021-12-11 DIAGNOSIS — R918 Other nonspecific abnormal finding of lung field: Secondary | ICD-10-CM | POA: Diagnosis not present

## 2021-12-11 DIAGNOSIS — I5022 Chronic systolic (congestive) heart failure: Secondary | ICD-10-CM

## 2021-12-11 DIAGNOSIS — Z794 Long term (current) use of insulin: Secondary | ICD-10-CM

## 2021-12-11 MED ORDER — GLUCOSE BLOOD VI STRP: ORAL_STRIP | 12 refills | Status: DC

## 2021-12-11 MED ORDER — FUROSEMIDE 40 MG PO TABS: 60.0000 mg | ORAL_TABLET | Freq: Every day | ORAL | 0 refills | Status: DC

## 2021-12-11 MED ORDER — FUROSEMIDE 40 MG PO TABS
60.0000 mg | ORAL_TABLET | Freq: Every day | ORAL | 0 refills | Status: DC
  Filled 2021-12-11: qty 135, 90d supply, fill #0

## 2021-12-11 MED ORDER — UNIFINE PENTIPS 32G X 4 MM MISC
1.0000 [IU] | Freq: Two times a day (BID) | 3 refills | Status: DC
  Filled 2021-12-11: qty 100, 50d supply, fill #0

## 2021-12-11 MED ORDER — PANTOPRAZOLE SODIUM 40 MG PO TBEC
40.0000 mg | DELAYED_RELEASE_TABLET | Freq: Every day | ORAL | 0 refills | Status: DC
  Filled 2021-12-11: qty 30, 30d supply, fill #0

## 2021-12-11 NOTE — Patient Instructions (Addendum)
Swelling in feet ?Increase lasix to 60 mg daily and follow up in 1 week ? ?Fatigue and anemia  ?We will check a CBC today ?We will also send a referral to the GI doctors ?You can take Pantoprazole 40 mg daily to see if this helps with pain ? ?Diabetes ?Continue your medications and we will arrange for CGM monitoring ?I have refilled you test strip and pen needles  ? ? ?

## 2021-12-12 ENCOUNTER — Ambulatory Visit (HOSPITAL_COMMUNITY)
Admission: RE | Admit: 2021-12-12 | Discharge: 2021-12-12 | Disposition: A | Discharge status: Home / Self Care | Payer: 59 | Source: Ambulatory Visit | Attending: Pulmonary Disease | Admitting: Pulmonary Disease

## 2021-12-12 DIAGNOSIS — R911 Solitary pulmonary nodule: Secondary | ICD-10-CM

## 2021-12-12 LAB — CBC
Hematocrit: 21 % — ABNORMAL LOW (ref 34.0–46.6)
Hemoglobin: 6.9 g/dL — CL (ref 11.1–15.9)
MCH: 28.5 pg (ref 26.6–33.0)
MCHC: 32.9 g/dL (ref 31.5–35.7)
MCV: 87 fL (ref 79–97)
Platelets: 307 10*3/uL (ref 150–450)
RBC: 2.42 x10E6/uL — CL (ref 3.77–5.28)
RDW: 13.9 % (ref 11.7–15.4)
WBC: 9.9 10*3/uL (ref 3.4–10.8)

## 2021-12-12 LAB — GLUCOSE, CAPILLARY: Glucose-Capillary: 224 mg/dL — ABNORMAL HIGH (ref 70–99)

## 2021-12-12 IMAGING — PT NM PET TUM IMG INITIAL (PI) SKULL BASE T - THIGH
1 of 8 series · 1 of 25 positions shown · non-contrast
Comparison: CT on [DATE]

CLINICAL DATA: Initial treatment strategy for right lung nodule.

EXAM:
NUCLEAR MEDICINE PET SKULL BASE TO THIGH
TECHNIQUE: 5.8 mCi F-18 FDG was injected intravenously. Full-ring PET imaging
was performed from the skull base to thigh after the radiotracer. CT
data was obtained and used for attenuation correction and anatomic
localization.
Fasting blood glucose: 224 mg/dl

[Series 4: ct sk_thigh 5.0 br38 · axial · 5.0mm · 0.87mm/px · 1 of 226 slices shown]
[im 226/226  brain]
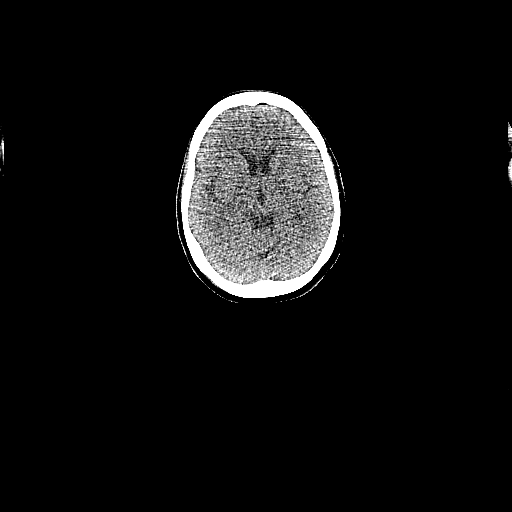

[1 of 25 positions shown; findings below may reference images not displayed]

FINDINGS: Mediastinal blood-pool activity (background): SUV max =

Liver activity (reference): SUV max = N/A

NECK:  No hypermetabolic lymph nodes or masses.

Incidental CT findings:  None.

CHEST: No hypermetabolic lymph nodes. 9 mm pulmonary nodule in the
lateral right lung apex shows mild hypermetabolism, with SUV max of
2.1. No other suspicious pulmonary nodules seen on CT images.

Incidental CT findings: Mild goiter without focal hypermetabolism.
Small pericardial effusion. Tiny bilateral pleural effusions.

ABDOMEN/PELVIS: No abnormal hypermetabolic activity within the
liver, pancreas, adrenal glands, or spleen. No hypermetabolic lymph
nodes in the abdomen or pelvis.

Incidental CT findings: Minimal ascites and diffuse body wall edema,
consistent with anasarca. Aortic atherosclerotic calcification
incidentally noted.

SKELETON: No focal hypermetabolic bone lesions to suggest skeletal
metastasis.

Incidental CT findings:  None.
IMPRESSION: 9 mm right apical pulmonary nodule shows mild hypermetabolism,
suspicious for primary bronchogenic carcinoma.

No evidence of metastatic disease.

Anasarca.

Aortic Atherosclerosis ([[place]]-[place]]).

## 2021-12-12 MED ORDER — FLUDEOXYGLUCOSE F - 18 (FDG) INJECTION
5.8300 | Freq: Once | INTRAVENOUS | Status: AC | PRN
Start: 2021-12-12 — End: 2021-12-12
  Administered 2021-12-12: 5.83 via INTRAVENOUS

## 2021-12-13 ENCOUNTER — Encounter: Payer: Self-pay | Admitting: Student

## 2021-12-13 NOTE — Progress Notes (Signed)
Established Patient Office Visit  Subjective   Patient ID: Amber Cross, female    DOB: [birth year]  Age: 61 y.o. MRN: [id number]  Chief Complaint  Patient presents with   Hospitalization Follow-up    Amber Cross is a 61 year old female who presents today for hospital follow up. Please refer to problem based charting for further details and assessment and plan of current problem and chronic medical conditions.    Patient Active Problem List   Diagnosis Date Noted   Chronic diarrhea 11/30/2021   Medication management 11/30/2021   History of CVA (cerebrovascular accident) 11/30/2021   Malnutrition of moderate degree 11/23/2021   Severe hypertension 40/98/1191   Chronic systolic CHF (congestive heart failure) ([place]) 11/07/2021   Pulmonary nodules 11/07/2021   Normocytic anemia 11/07/2021   Proteinuria 11/07/2021   Stage 3b chronic kidney disease (CKD) ([place]) 11/07/2021   Chest pain 05/07/2021   Hyperlipidemia LDL goal <100 04/16/2021   Atrophic vaginitis 05/08/2020   Diabetic neuropathy ([place])  07/20/2019   Tobacco use 07/20/2019   Weight loss 07/20/2019   Essential hypertension 05/03/2019   Type 2 diabetes mellitus with peripheral neuropathy ([place]) 04/06/2019    Review of Systems  All other systems reviewed and are negative.    Objective:     BP 137/60 (BP Location: Right Arm, Patient Position: Sitting)   Pulse 72   Temp 98.5 F (36.9 C) (Oral)   Ht 5' 7.5" (1.715 m)   Wt 129 lb 14.4 oz (58.9 kg)   LMP 09/29/2011   SpO2 99%   BMI 20.04 kg/m  BP Readings from Last 3 Encounters:  12/11/21 137/60  12/03/21 140/62  11/29/21 (!) 176/70      Physical Exam Constitutional:      Comments: Chronically ill appeaing  HENT:     Mouth/Throat:     Mouth: Mucous membranes are moist.     Pharynx: Oropharynx is clear.  Eyes:     Extraocular Movements: Extraocular movements intact.     Conjunctiva/sclera: Conjunctivae normal.     Pupils: Pupils are equal, round, and  reactive to light.  Cardiovascular:     Rate and Rhythm: Normal rate and regular rhythm.     Comments: JVD to mid neck Pulmonary:     Effort: Pulmonary effort is normal.     Breath sounds: No rhonchi or rales.  Abdominal:     General: Abdomen is flat. Bowel sounds are normal.     Palpations: Abdomen is soft.     Tenderness: There is abdominal tenderness (epigastrium extending to mid sternum).  Musculoskeletal:     Comments: 2+ pitting edema of the bilateral feet, trace edema of BLE  Skin:    General: Skin is warm and dry.     Coloration: Skin is not jaundiced.     Findings: No bruising or lesion.  Neurological:     General: No focal deficit present.     Mental Status: She is alert and oriented to person, place, and time.     Results for orders placed or performed in visit on 12/11/21  CBC no Diff  Result Value Ref Range   WBC 9.9 3.4 - 10.8 x10E3/uL   RBC 2.42 (LL) 3.77 - 5.28 x10E6/uL   Hemoglobin 6.9 (LL) 11.1 - 15.9 g/dL   Hematocrit 21.0 (L) 34.0 - 46.6 %   MCV 87 79 - 97 fL   MCH 28.5 26.6 - 33.0 pg   MCHC 32.9 31.5 - 35.7 g/dL   RDW  13.9 11.7 - 15.4 %   Platelets 307 150 - 450 x10E3/uL    Last CBC Lab Results  Component Value Date   WBC 9.9 12/11/2021   HGB 6.9 (LL) 12/11/2021   HCT 21.0 (L) 12/11/2021   MCV 87 12/11/2021   MCH 28.5 12/11/2021   RDW 13.9 12/11/2021   PLT 307 12/11/2021    The 10-year ASCVD risk score (Arnett DK, et al., 2019) is: 28.7%    Assessment & Plan:   Problem List Items Addressed This Visit       Endocrine   Type 2 diabetes mellitus with peripheral neuropathy (HCC)   Relevant Medications   glucose blood test strip   Other Relevant Orders   Consult to diabetes coordinator     Other   Normocytic anemia - Primary   Relevant Orders   CBC no Diff (Completed)   Ambulatory referral to Gastroenterology   Other Visit Diagnoses     Epigastric pain       Relevant Orders   Ambulatory referral to Gastroenterology        Return in about 1 week (around 12/18/2021).    Iona Beard, MD  Discuss with Dr. Dareen Piano

## 2021-12-13 NOTE — Assessment & Plan Note (Signed)
Patient endorses hematochezia while admitted.  This is now resolved and has no further episodes of blood in her stool.  She denies other symptoms of bleeding.  However she does feel quite fatigued.  Iron studies in hospital consistent with anemia of chronic inflammation possibly in the setting of CKD as well as possible underlying lung mass.  Has never had a colonoscopy.  Repeat CBC with hemoglobin decreased from 7.3-6.9.  Called patient to discuss this and will set up blood transfusion for her with close follow-up in clinic in 1 week.  We will place referral to GI for diagnostic colonoscopy and further evaluation of possible GI bleeding.

## 2021-12-13 NOTE — Assessment & Plan Note (Signed)
Patient currently taking 70/30 Novolin 10 units twice daily.  Reports blood sugars at home to be in the 300s fasting.  However has also had multiple episodes where her glucoses are not 60 on her BMP.  She did not bring her meter with her today as she has run out of test strips.  We will refill diabetes supplies.  Referral to diabetes educator for CGM monitoring.

## 2021-12-13 NOTE — Assessment & Plan Note (Addendum)
Patient admitted from 4 26-4 28 for which a stent epigastric pain.  Has had multiple work-ups and negative for ACS.  Prior surgery with epigastric scarring with reproducible tenderness in this region and along the chest suspect possible MSK etiology as well as possible element of fibromyalgia due to shifting in her pain throughout her hospitalization.  Continues to have epigastric pain that radiates up her chest.  Describes it as a burning sharp sensation that is reproducible on exam.  She has been taking sucralfate, duloxetine, and gabapentin without much improvement does not appear she has a PPI currently.  Extensive cardiac work-up was essentially normal during her admission.  We will resume pantoprazole 40 mg daily to see if this improves her pain.

## 2021-12-13 NOTE — Assessment & Plan Note (Signed)
Recently checked last week follow-up cardiology follow-up.  Creatinine of 1.78 with BUN of 29.  Potassium stable at 3.6.  Appears kidney function has returned to baseline.

## 2021-12-13 NOTE — Assessment & Plan Note (Signed)
Follows with pulmonology and saw Dr. Valeta Harms last week.  Plan for bronchoscopy for further evaluation of right upper lobe nodule.

## 2021-12-13 NOTE — Progress Notes (Signed)
Internal Medicine Clinic Attending ? ?Case discussed with Dr. Liang  At the time of the visit.  We reviewed the resident?s history and exam and pertinent patient test results.  I agree with the assessment, diagnosis, and plan of care documented in the resident?s note. ? ?

## 2021-12-13 NOTE — Assessment & Plan Note (Addendum)
Echo on 11/08/2021 with EF of 55 to 10%, grade 1 diastolic dysfunction.  Dilated left atria. Currently takes Lasix 40 mg daily.  Reports swelling in her bilateral feet.  Also with some shortness of breath.  Mild JVD to the middle of her neck. On exam appears weight is increased from discharge by about 9 pounds.  Weight is up 5 pounds from visit last week to pulmonology.  Appears she is volume overloaded we will increase her furosemide to 60 mg daily and have her follow-up in clinic next week

## 2021-12-14 ENCOUNTER — Telehealth: Payer: Self-pay | Admitting: Pulmonary Disease

## 2021-12-14 NOTE — Telephone Encounter (Signed)
I spoke with the patient and she was told after going to [facility] that she would not need a covid test. Per Judeen Hammans she will call the patient and let her know how to proceed. Nothing further needed.

## 2021-12-17 ENCOUNTER — Encounter (HOSPITAL_COMMUNITY): Payer: Self-pay | Admitting: Pulmonary Disease

## 2021-12-17 ENCOUNTER — Other Ambulatory Visit: Payer: Self-pay | Admitting: Emergency Medicine

## 2021-12-17 ENCOUNTER — Other Ambulatory Visit: Payer: Self-pay

## 2021-12-17 LAB — SARS CORONAVIRUS 2 (TAT 6-24 HRS): SARS Coronavirus 2: NEGATIVE

## 2021-12-17 NOTE — Anesthesia Preprocedure Evaluation (Addendum)
Anesthesia Evaluation  Patient identified by MRN, date of birth, ID band Patient awake    Reviewed: Allergy & Precautions, H&P , NPO status , Patient's Chart, lab work & pertinent test results  Airway Mallampati: II  TM Distance: >3 FB Neck ROM: Full    Dental no notable dental hx. (+) Poor Dentition, Dental Advisory Given   Pulmonary Current Smoker and Patient abstained from smoking.,    Pulmonary exam normal breath sounds clear to auscultation       Cardiovascular Exercise Tolerance: Good hypertension, Pt. on medications and Pt. on home beta blockers +CHF   Rhythm:Regular Rate:Normal     Neuro/Psych  Headaches, Depression CVA, Residual Symptoms    GI/Hepatic negative GI ROS, Neg liver ROS,   Endo/Other  diabetes, Insulin Dependent  Renal/GU Renal InsufficiencyRenal disease  negative genitourinary   Musculoskeletal  (+) Arthritis , Osteoarthritis,    Abdominal   Peds  Hematology  (+) Blood dyscrasia, anemia ,   Anesthesia Other Findings   Reproductive/Obstetrics negative OB ROS                           Anesthesia Physical Anesthesia Plan  ASA: 3  Anesthesia Plan: General   Post-op Pain Management: Tylenol PO (pre-op)*   Induction: Intravenous  PONV Risk Score and Plan: 3 and Ondansetron, Midazolam and Dexamethasone  Airway Management Planned: Oral ETT  Additional Equipment:   Intra-op Plan:   Post-operative Plan: Extubation in OR  Informed Consent: I have reviewed the patients History and Physical, chart, labs and discussed the procedure including the risks, benefits and alternatives for the proposed anesthesia with the patient or authorized representative who has indicated his/her understanding and acceptance.     Dental advisory given  Plan Discussed with: CRNA  Anesthesia Plan Comments: (PAT note by Karoline Caldwell, PA-C: Recent admission April 2023 for chest pain and  severe hypertension complicated by acute punctate infarct.  Per discharge summary 11/23/2021, "24-hourSBP: 140-191. Work-up unremarkable for LOV:FIEPPIRJ18->84,ZYSAYT reflecting demand ischemia, and herEKG shows J-point elevation in V2 and V3 but is otherwise unremarkable. CT head negative, MRI brain with punctate acute subcortical white matter infarct left superior frontal gyrus.MRA with no vessel occlusion or significant stenosis. Stroke work-up largely completed during previous admission: A1c 9.6, LDL 86, TSH 1.99, echo with LVEF 55 to 60%, normal LV function, moderately dilated LA. Neurology consulted,and believed her symptoms were more functional thanoforganic cause.Medication regimen: Home losartan 100 mg p.o. daily,amlodipine 5 mg daily,Coreg12.5 mg twice daily with meals (heldthroughout admission).PT/OT recommending outpatient therapies."  Patient followed up outpatient with cardiologist Dr. Harriet Masson on 11/29/2021.  Per note, patient euvolemic at that time no evidence of diastolic CHF exacerbation, recommended continue current medications.  BP was noted to be elevated, patient stated she had been out of her carvedilol for several days.  She was instructed to pick up a refill and take all medications as prescribed.  Longstanding history of tobacco abuse, continues to smoke half to 1 pack/day.  Recently discovered to have right upper lobe nodule concerning for primary bronchogenic carcinoma.  Seen by pulmonologist Dr. Valeta Harms 12/03/2021 and bronchoscopy recommended.  Patient seen by PCP Dr. Lisabeth Devoid at [place] 12/11/2021 for follow-up of chronic conditions.  She was noted to be volume overloaded and her Lasix was increased.  She was noted to have worsening of her chronic anemia with hemoglobin 6.9.  Transfusion was ordered, scheduled 12/19/2021.  She was also referred to GI for  anemia and chronic abdominal pain.  CKD 3B.  Creatinine 1.78 on BMP 10/30/2021 which is near  baseline.  Remainder of BMP unremarkable.  IDDM 2 with variable medication compliance.  Last A1c 9.6 on 11/07/2021.  Glucose highly variable, recent episodes of both hyper and hypoglycemia.  Reviewed history with anesthesiologist Dr. Gloris Manchester. Pt to have ISTAT on DOS to reevaluate Hgb.  EKG 11/21/21: Sinus rhythm. Rate 85. LVH with secondary repolarization abnormality  CT Chest 11/06/21: IMPRESSION: 1. Stable 9 mm and a subpleural micronodule within the right upper lobe. Biapical pleural/pulmonary scarring with nodular like density at the right apex measuring 4 mm. Follow-up CT chest in 12-18 months (from today's scan) is considered optional for low-risk patients, but is recommended for high-risk patients. This recommendation follows the consensus statement: Guidelines for Management of Incidental Pulmonary Nodules Detected on CT Images: From the Fleischner Society 2017; Radiology 2017; 284:228-243. 2. Cardiomegaly. 3. Findings suggestive of anemia. 4. No acute intra-abdominal or intrapelvic abnormality in the setting of markedly limited evaluation on this noncontrast study. 5. Aortic Atherosclerosis (ICD10-I70.0  TTE 11/08/21: 1. The LV endocardium has a bright speckled appearence.  2. The patient is followed at [facility] . Cardiac MRI 09/21/21 at  [place] revealed no evidence of amyloidosis.  3. There is severe concentric LVH with diastolic dysfunction and small  pericardial effusion.  4. Left ventricular ejection fraction, by estimation, is 55 to 60%. The  left ventricle has normal function. The left ventricle has no regional  wall motion abnormalities. There is severe concentric left ventricular  hypertrophy. Left ventricular diastolic  parameters are consistent with Grade I diastolic dysfunction (impaired  relaxation).  5. Right ventricular systolic function is normal. The right ventricular  size is normal. There is normal pulmonary artery systolic pressure.  6.  Left atrial size was moderately dilated.  7. There is no evidence of cardiac tamponade.  8. The mitral valve is normal in structure. Trivial mitral valve  regurgitation.  9. The aortic valve is normal in structure. Aortic valve regurgitation is  not visualized.   Cardiac MRI 09/21/21 (care everywhere): Marland Kitchen Left ventricle: Normal size and wall thickness.  . Right ventricle: Normal size and wall thickness. No aneurysm or fibrofatty infiltration. Ejection fraction 68 %  . Left atrium: Normal size. Normal without filling defect. Left atrial appendage normal.  . Right atrium: Normal size. No filling defect  . Pericardium: Normal pericardial thickness. No effusion or masses.  Verl Blalock motion/ejection fraction: Normal wall motion. Ejection fraction 48 %  . Aortic valve: No gross abnormalities. No regurgitant or stenotic flow jets.  . Mitral valve: No gross abnormalities. No regurgitant or stenotic flow jets.  . Tricuspid valve: No gross abnormalities. No regurgitant or stenotic flow jets.  . Pulmonic valve: No gross abnormalities. No regurgitant or stenotic flow jets.  . Myocardial edema: No myocardial edema.  . First-pass perfusion: Normal. No subendocardial defects to suggest ischemia or infarct.  . Delayed myocardial enhancement: None  . Incidental findings: Small pericardial effusion.  Nuclear stress 01/24/21: IMPRESSION:  1. No reversible ischemia or infarction.   2. Mild generalized left ventricular hypokinesis without focal wall  motion abnormality.   3. Left ventricular ejection fraction 43%   4. Non invasive risk stratification*: Intermediate   )      Anesthesia Quick Evaluation

## 2021-12-17 NOTE — Progress Notes (Addendum)
Anesthesia Chart Review:  Recent admission April 2023 for chest pain and severe hypertension complicated by acute punctate infarct.  Per discharge summary 11/23/2021, "24-hour SBP: 140-191.  Work-up unremarkable for ACS: troponin 34->35, likely reflecting demand ischemia, and her EKG shows J-point elevation in V2 and V3 but is otherwise unremarkable. CT head negative, MRI brain with punctate acute subcortical white matter infarct left superior frontal gyrus.  MRA with no vessel occlusion or significant stenosis.  Stroke work-up largely completed during previous admission: A1c 9.6, LDL 86, TSH 1.99, echo with LVEF 55 to 60%, normal LV function, moderately dilated LA.  Neurology consulted, and believed her symptoms were more functional than of organic cause. Medication regimen: Home losartan 100 mg p.o. daily, amlodipine 5 mg daily, Coreg 12.5 mg twice daily with meals (held throughout admission).  PT/OT recommending outpatient therapies."  Patient followed up outpatient with cardiologist Dr. Harriet Masson on 11/29/2021.  Per note, patient euvolemic at that time no evidence of diastolic CHF exacerbation, recommended continue current medications.  BP was noted to be elevated, patient stated she had been out of her carvedilol for several days.  She was instructed to pick up a refill and take all medications as prescribed.  Longstanding history of tobacco abuse, continues to smoke half to 1 pack/day.  Recently discovered to have right upper lobe nodule concerning for primary bronchogenic carcinoma.  Seen by pulmonologist Dr. Valeta Harms 12/03/2021 and bronchoscopy recommended.  Patient seen by PCP Dr. Lisabeth Devoid at [place] 12/11/2021 for follow-up of chronic conditions.  She was noted to be volume overloaded and her Lasix was increased.  She was noted to have worsening of her chronic anemia with hemoglobin 6.9.  Transfusion was ordered, scheduled 12/19/2021.  She was also referred to GI for anemia and chronic  abdominal pain.  CKD 3B.  Creatinine 1.78 on BMP 10/30/2021 which is near baseline.  Remainder of BMP unremarkable.  IDDM 2 with variable medication compliance.  Last A1c 9.6 on 11/07/2021.  Glucose highly variable, recent episodes of both hyper and hypoglycemia.  Reviewed history with anesthesiologist Dr. Gloris Manchester. Pt to have ISTAT on DOS to reevaluate Hgb.  EKG 11/21/21: Sinus rhythm. Rate 85. LVH with secondary repolarization abnormality  CT Chest 11/06/21: IMPRESSION: 1. Stable 9 mm and a subpleural micronodule within the right upper lobe. Biapical pleural/pulmonary scarring with nodular like density at the right apex measuring 4 mm. Follow-up CT chest in 12-18 months (from today's scan) is considered optional for low-risk patients, but is recommended for high-risk patients. This recommendation follows the consensus statement: Guidelines for Management of Incidental Pulmonary Nodules Detected on CT Images: From the Fleischner Society 2017; Radiology 2017; 284:228-243. 2. Cardiomegaly. 3. Findings suggestive of anemia. 4. No acute intra-abdominal or intrapelvic abnormality in the setting of markedly limited evaluation on this noncontrast study. 5.  Aortic Atherosclerosis (ICD10-I70.0  TTE 11/08/21:  1. The LV endocardium has a bright speckled appearence.   2. The patient is followed at [facility] . Cardiac MRI 09/21/21 at  [place] revealed no evidence of amyloidosis.   3. There is severe concentric LVH with diastolic dysfunction and small  pericardial effusion.   4. Left ventricular ejection fraction, by estimation, is 55 to 60%. The  left ventricle has normal function. The left ventricle has no regional  wall motion abnormalities. There is severe concentric left ventricular  hypertrophy. Left ventricular diastolic   parameters are consistent with Grade I diastolic dysfunction (impaired  relaxation).   5. Right ventricular systolic function  is normal. The right ventricular   size is normal. There is normal pulmonary artery systolic pressure.   6. Left atrial size was moderately dilated.   7. There is no evidence of cardiac tamponade.   8. The mitral valve is normal in structure. Trivial mitral valve  regurgitation.   9. The aortic valve is normal in structure. Aortic valve regurgitation is  not visualized.   Cardiac MRI 09/21/21 (care everywhere): Marland Kitchen Left ventricle: Normal size and wall thickness.  . Right ventricle: Normal size and wall thickness. No aneurysm or fibrofatty infiltration. Ejection fraction 68 %  . Left atrium: Normal size. Normal without filling defect. Left atrial appendage normal.  . Right atrium: Normal size. No filling defect  . Pericardium: Normal pericardial thickness. No effusion or masses.  Verl Blalock motion/ejection fraction: Normal wall motion. Ejection fraction 48 %  . Aortic valve: No gross abnormalities. No regurgitant or stenotic flow jets.  . Mitral valve: No gross abnormalities. No regurgitant or stenotic flow jets.  . Tricuspid valve: No gross abnormalities. No regurgitant or stenotic flow jets.  . Pulmonic valve: No gross abnormalities. No regurgitant or stenotic flow jets.  . Myocardial edema: No myocardial edema.  . First-pass perfusion: Normal. No subendocardial defects to suggest ischemia or infarct.  . Delayed myocardial enhancement: None  . Incidental findings: Small pericardial effusion.  Nuclear stress 01/24/21: IMPRESSION:  1. No reversible ischemia or infarction.   2. Mild generalized left ventricular hypokinesis without focal wall  motion abnormality.   3. Left ventricular ejection fraction 43%   4. Non invasive risk stratification*: Intermediate     Wynonia Musty [facility] Short Stay Center/Anesthesiology Phone [phone] 12/17/2021 11:23 AM

## 2021-12-17 NOTE — Progress Notes (Addendum)
Ms Amber Cross states that she does not get short of breath walking in the home, but does become short of breath is she has to walk a lot. Ms Amber Cross denies chest pain at present time.  Ms Amber Cross 's cardiologist is Dr. Berniece Salines. PCP is with [facility]. Neurologist is Dr.McNeil Leonel Ramsay.  Ms  Amber Cross  was   hospitalized   with Chest pain , which was not found to be cardiac.  Ms Amber Cross has type II diabetes.  I instructed patient to not take 70/30 Insulin at dinner tonight. I also instructed patient to not take any medication for DM in am, if CBG greater than 220 call pre- surgery. If CBG is less than 50 to treat with 1/ cup of clear juice, apple, grape, cranberry and recheck CBG 15 minutes later.  I asked anesthesiology PA-C to review .

## 2021-12-18 ENCOUNTER — Ambulatory Visit (HOSPITAL_COMMUNITY): Payer: 59 | Admitting: Physician Assistant

## 2021-12-18 ENCOUNTER — Telehealth: Payer: Self-pay | Admitting: Radiation Oncology

## 2021-12-18 ENCOUNTER — Encounter (HOSPITAL_COMMUNITY): Payer: Self-pay | Admitting: Pulmonary Disease

## 2021-12-18 ENCOUNTER — Other Ambulatory Visit: Payer: Self-pay

## 2021-12-18 ENCOUNTER — Ambulatory Visit (HOSPITAL_BASED_OUTPATIENT_CLINIC_OR_DEPARTMENT_OTHER): Payer: 59 | Admitting: Physician Assistant

## 2021-12-18 ENCOUNTER — Ambulatory Visit (HOSPITAL_COMMUNITY): Payer: 59

## 2021-12-18 ENCOUNTER — Encounter (HOSPITAL_COMMUNITY)
Admission: RE | Disposition: A | Discharge status: Home / Self Care | Payer: Self-pay | Source: Home / Self Care | Attending: Pulmonary Disease

## 2021-12-18 ENCOUNTER — Ambulatory Visit (HOSPITAL_COMMUNITY)
Admission: RE | Admit: 2021-12-18 | Discharge: 2021-12-18 | Disposition: A | Discharge status: Home / Self Care | Payer: 59 | Attending: Pulmonary Disease | Admitting: Pulmonary Disease

## 2021-12-18 DIAGNOSIS — I6930 Unspecified sequelae of cerebral infarction: Secondary | ICD-10-CM | POA: Diagnosis not present

## 2021-12-18 DIAGNOSIS — Z79899 Other long term (current) drug therapy: Secondary | ICD-10-CM | POA: Insufficient documentation

## 2021-12-18 DIAGNOSIS — N1832 Chronic kidney disease, stage 3b: Secondary | ICD-10-CM | POA: Diagnosis not present

## 2021-12-18 DIAGNOSIS — I509 Heart failure, unspecified: Secondary | ICD-10-CM | POA: Diagnosis not present

## 2021-12-18 DIAGNOSIS — Z794 Long term (current) use of insulin: Secondary | ICD-10-CM | POA: Diagnosis not present

## 2021-12-18 DIAGNOSIS — F17210 Nicotine dependence, cigarettes, uncomplicated: Secondary | ICD-10-CM | POA: Insufficient documentation

## 2021-12-18 DIAGNOSIS — I110 Hypertensive heart disease with heart failure: Secondary | ICD-10-CM

## 2021-12-18 DIAGNOSIS — I130 Hypertensive heart and chronic kidney disease with heart failure and stage 1 through stage 4 chronic kidney disease, or unspecified chronic kidney disease: Secondary | ICD-10-CM | POA: Diagnosis not present

## 2021-12-18 DIAGNOSIS — F32A Depression, unspecified: Secondary | ICD-10-CM | POA: Insufficient documentation

## 2021-12-18 DIAGNOSIS — C3490 Malignant neoplasm of unspecified part of unspecified bronchus or lung: Secondary | ICD-10-CM

## 2021-12-18 DIAGNOSIS — R911 Solitary pulmonary nodule: Secondary | ICD-10-CM | POA: Diagnosis present

## 2021-12-18 DIAGNOSIS — E1122 Type 2 diabetes mellitus with diabetic chronic kidney disease: Secondary | ICD-10-CM | POA: Diagnosis not present

## 2021-12-18 DIAGNOSIS — Z8673 Personal history of transient ischemic attack (TIA), and cerebral infarction without residual deficits: Secondary | ICD-10-CM | POA: Diagnosis not present

## 2021-12-18 DIAGNOSIS — R918 Other nonspecific abnormal finding of lung field: Secondary | ICD-10-CM | POA: Diagnosis present

## 2021-12-18 DIAGNOSIS — M1990 Unspecified osteoarthritis, unspecified site: Secondary | ICD-10-CM | POA: Insufficient documentation

## 2021-12-18 DIAGNOSIS — C3411 Malignant neoplasm of upper lobe, right bronchus or lung: Secondary | ICD-10-CM | POA: Insufficient documentation

## 2021-12-18 HISTORY — PX: BRONCHIAL BRUSHINGS: SHX5108

## 2021-12-18 HISTORY — DX: Chronic kidney disease, unspecified: N18.9

## 2021-12-18 HISTORY — DX: Unspecified osteoarthritis, unspecified site: M19.90

## 2021-12-18 HISTORY — DX: Headache, unspecified: R51.9

## 2021-12-18 HISTORY — DX: Depression, unspecified: F32.A

## 2021-12-18 HISTORY — PX: FIDUCIAL MARKER PLACEMENT: SHX6858

## 2021-12-18 HISTORY — DX: Malignant neoplasm of unspecified part of unspecified bronchus or lung: C34.90

## 2021-12-18 HISTORY — DX: Heart failure, unspecified: I50.9

## 2021-12-18 HISTORY — DX: Anemia, unspecified: D64.9

## 2021-12-18 HISTORY — PX: VIDEO BRONCHOSCOPY WITH RADIAL ENDOBRONCHIAL ULTRASOUND: SHX6849

## 2021-12-18 HISTORY — PX: BRONCHIAL NEEDLE ASPIRATION BIOPSY: SHX5106

## 2021-12-18 HISTORY — PX: BRONCHIAL BIOPSY: SHX5109

## 2021-12-18 LAB — POCT I-STAT, CHEM 8
BUN: 32 mg/dL — ABNORMAL HIGH (ref 6–20)
Calcium, Ion: 1.13 mmol/L — ABNORMAL LOW (ref 1.15–1.40)
Chloride: 102 mmol/L (ref 98–111)
Creatinine, Ser: 2 mg/dL — ABNORMAL HIGH (ref 0.44–1.00)
Glucose, Bld: 205 mg/dL — ABNORMAL HIGH (ref 70–99)
HCT: 22 % — ABNORMAL LOW (ref 36.0–46.0)
Hemoglobin: 7.5 g/dL — ABNORMAL LOW (ref 12.0–15.0)
Potassium: 3.2 mmol/L — ABNORMAL LOW (ref 3.5–5.1)
Sodium: 138 mmol/L (ref 135–145)
TCO2: 25 mmol/L (ref 22–32)

## 2021-12-18 LAB — GLUCOSE, CAPILLARY
Glucose-Capillary: 236 mg/dL — ABNORMAL HIGH (ref 70–99)
Glucose-Capillary: 172 mg/dL — ABNORMAL HIGH (ref 70–99)

## 2021-12-18 IMAGING — DX DG CHEST 1V PORT
1 series · 1 of 1 positions shown · non-contrast
Comparison: [DATE]

CLINICAL DATA: Status post bronchoscopy

EXAM:
PORTABLE CHEST 1 VIEW

[chest ap]
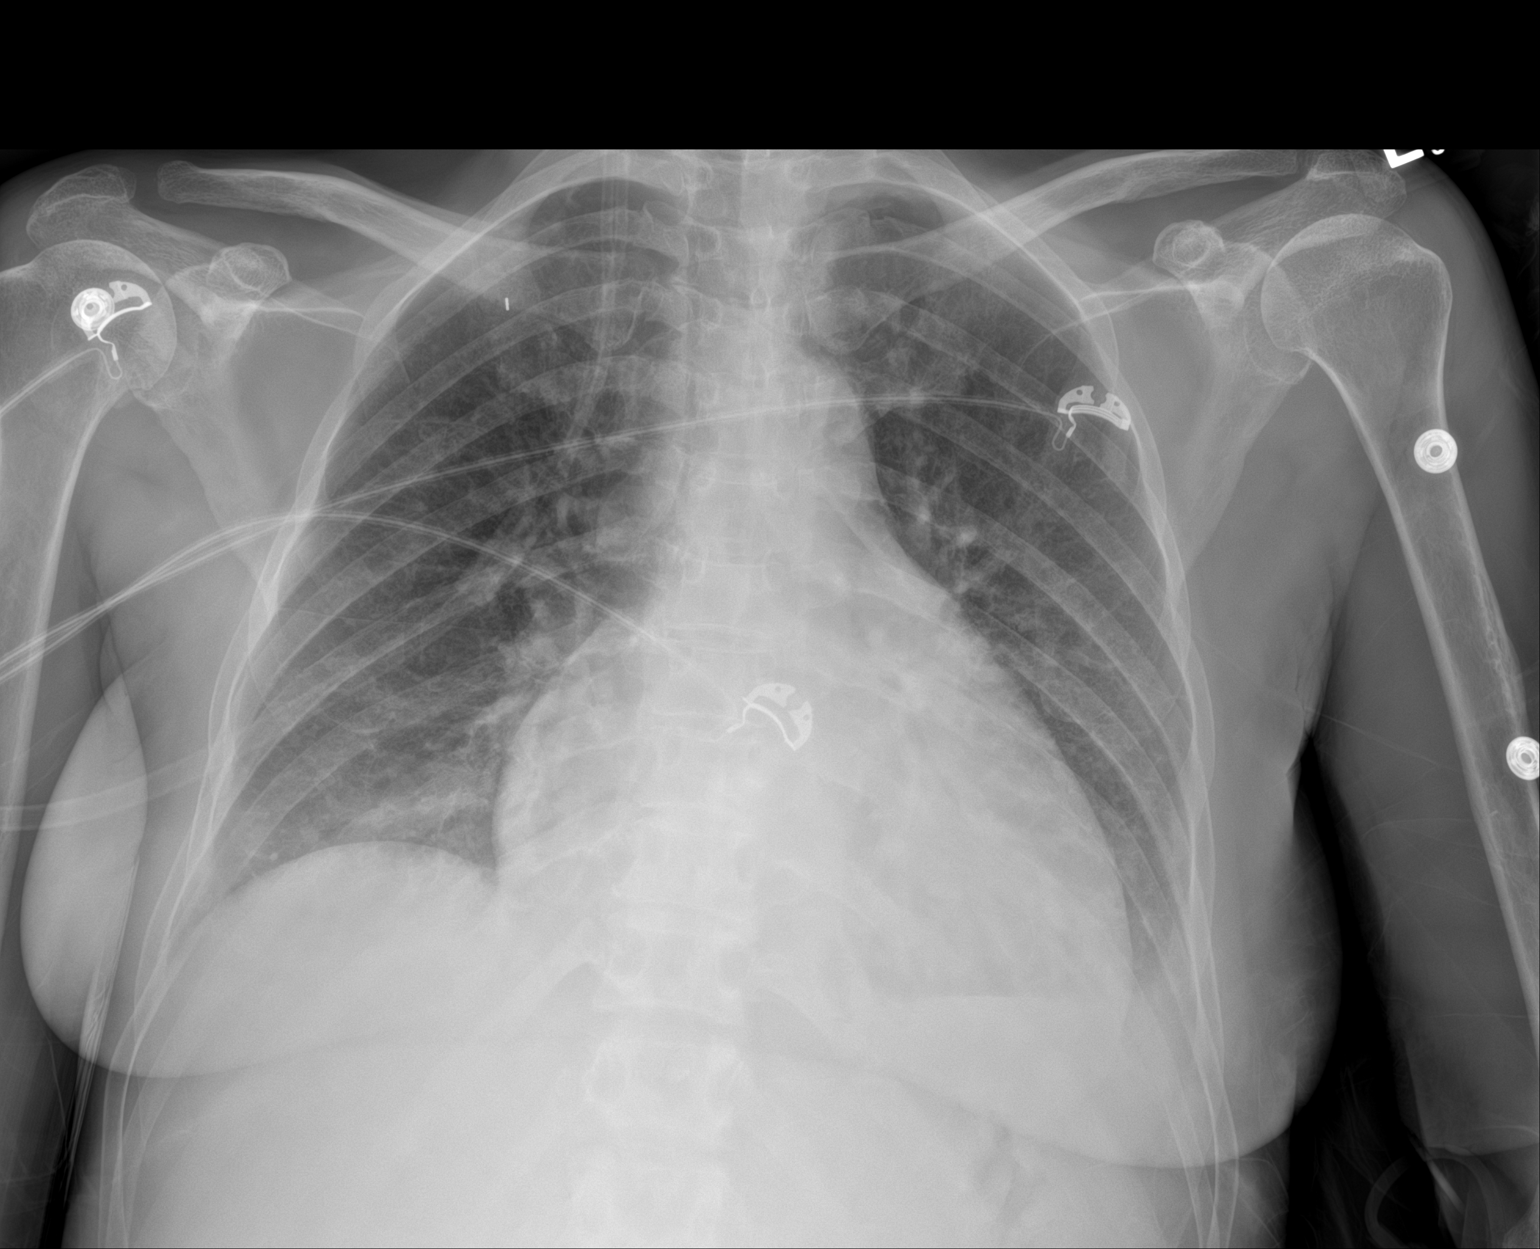

[1 of 1 positions shown; findings below may reference images not displayed]

FINDINGS: Cardiomegaly. Probable small bilateral pleural effusions. Biopsy
marking clip within a right apical nodule. No pneumothorax. The
visualized skeletal structures are unremarkable.
IMPRESSION: 1. Biopsy marking clip within a right apical nodule. No
pneumothorax.
2. Cardiomegaly with probable small bilateral pleural effusions.

## 2021-12-18 SURGERY — BRONCHOSCOPY, WITH BIOPSY USING ELECTROMAGNETIC NAVIGATION
Anesthesia: General | Laterality: Right

## 2021-12-18 MED ORDER — PROPOFOL 500 MG/50ML IV EMUL
INTRAVENOUS | Status: DC | PRN
  Administered 2021-12-18: 150 ug/kg/min via INTRAVENOUS

## 2021-12-18 MED ORDER — DEXAMETHASONE SODIUM PHOSPHATE 4 MG/ML IJ SOLN
INTRAMUSCULAR | Status: DC | PRN
  Administered 2021-12-18: 4 mg via INTRAVENOUS

## 2021-12-18 MED ORDER — ROCURONIUM BROMIDE 10 MG/ML (PF) SYRINGE
PREFILLED_SYRINGE | INTRAVENOUS | Status: DC | PRN
  Administered 2021-12-18: 40 mg via INTRAVENOUS

## 2021-12-18 MED ORDER — FENTANYL CITRATE (PF) 100 MCG/2ML IJ SOLN: 25.0000 ug | INTRAMUSCULAR | Status: DC | PRN

## 2021-12-18 MED ORDER — FENTANYL CITRATE (PF) 100 MCG/2ML IJ SOLN
INTRAMUSCULAR | Status: DC | PRN
  Administered 2021-12-18: 50 ug via INTRAVENOUS

## 2021-12-18 MED ORDER — CARVEDILOL 12.5 MG PO TABS
25.0000 mg | ORAL_TABLET | Freq: Once | ORAL | Status: AC
  Administered 2021-12-18: 25 mg via ORAL
  Filled 2021-12-18: qty 2

## 2021-12-18 MED ORDER — ONDANSETRON HCL 4 MG/2ML IJ SOLN
INTRAMUSCULAR | Status: DC | PRN
  Administered 2021-12-18: 4 mg via INTRAVENOUS

## 2021-12-18 MED ORDER — INSULIN ASPART 100 UNIT/ML IJ SOLN
0.0000 [IU] | INTRAMUSCULAR | Status: DC | PRN
  Administered 2021-12-18: 3 [IU] via SUBCUTANEOUS
  Filled 2021-12-18: qty 1

## 2021-12-18 MED ORDER — PROPOFOL 10 MG/ML IV BOLUS
INTRAVENOUS | Status: DC | PRN
Start: 2021-12-18 — End: 2021-12-18
  Administered 2021-12-18: 130 mg via INTRAVENOUS

## 2021-12-18 MED ORDER — CHLORHEXIDINE GLUCONATE 0.12 % MT SOLN
OROMUCOSAL | Status: AC
  Administered 2021-12-18: 15 mL
  Filled 2021-12-18: qty 15

## 2021-12-18 MED ORDER — MIDAZOLAM HCL 5 MG/5ML IJ SOLN
INTRAMUSCULAR | Status: DC | PRN
  Administered 2021-12-18: 1 mg via INTRAVENOUS

## 2021-12-18 MED ORDER — ACETAMINOPHEN 500 MG PO TABS
1000.0000 mg | ORAL_TABLET | Freq: Once | ORAL | Status: AC
  Administered 2021-12-18: 1000 mg via ORAL
  Filled 2021-12-18: qty 2

## 2021-12-18 MED ORDER — LIDOCAINE 2% (20 MG/ML) 5 ML SYRINGE
INTRAMUSCULAR | Status: DC | PRN
  Administered 2021-12-18: 40 mg via INTRAVENOUS

## 2021-12-18 MED ORDER — SUGAMMADEX SODIUM 200 MG/2ML IV SOLN
INTRAVENOUS | Status: DC | PRN
  Administered 2021-12-18: 200 mg via INTRAVENOUS

## 2021-12-18 MED ORDER — LACTATED RINGERS IV SOLN: INTRAVENOUS | Status: DC

## 2021-12-18 SURGICAL SUPPLY — 1 items: Superlock fiducial marker ×1 IMPLANT

## 2021-12-18 NOTE — Progress Notes (Signed)
Dr. Jefm Petty made aware of patient's BP readings this am. 201/88, 197/81, and 195/75. Pulse rate 70. Patient did not take home dose of Carvedilol this morning. Per Dr. Ola Spurr verbal order ok to give Carvedilol 25 mg.

## 2021-12-18 NOTE — Anesthesia Procedure Notes (Signed)
Procedure Name: Intubation Date/Time: 12/18/2021 7:47 AM Performed by: Lieutenant Diego, CRNA Pre-anesthesia Checklist: Patient identified, Emergency Drugs available, Suction available and Patient being monitored Patient Re-evaluated:Patient Re-evaluated prior to induction Oxygen Delivery Method: Circle system utilized Preoxygenation: Pre-oxygenation with 100% oxygen Induction Type: IV induction Ventilation: Mask ventilation without difficulty Laryngoscope Size: Miller and 2 Grade View: Grade I Tube type: Oral Tube size: 8.5 mm Number of attempts: 1 Airway Equipment and Method: Stylet Placement Confirmation: ETT inserted through vocal cords under direct vision, positive ETCO2 and breath sounds checked- equal and bilateral Secured at: 23 cm Tube secured with: Tape Dental Injury: Teeth and Oropharynx as per pre-operative assessment

## 2021-12-18 NOTE — Transfer of Care (Signed)
Immediate Anesthesia Transfer of Care Note  Patient: Amber Cross  Procedure(s) Performed: ROBOTIC ASSISTED NAVIGATIONAL BRONCHOSCOPY (Right) BRONCHIAL NEEDLE ASPIRATION BIOPSIES BRONCHIAL BIOPSIES BRONCHIAL BRUSHINGS FIDUCIAL MARKER PLACEMENT RADIAL ENDOBRONCHIAL ULTRASOUND  Patient Location: PACU  Anesthesia Type:General  Level of Consciousness: awake and patient cooperative  Airway & Oxygen Therapy: Patient Spontanous Breathing  Post-op Assessment: Report given to RN and Post -op Vital signs reviewed and stable  Post vital signs: Reviewed and stable  Last Vitals:  Vitals Value Taken Time  BP 127/65 12/18/21 0834  Temp    Pulse 58 12/18/21 0838  Resp 17 12/18/21 0838  SpO2 89 % 12/18/21 0838  Vitals shown include unvalidated device data.  Last Pain:  Vitals:   12/18/21 0629  TempSrc:   PainSc: 5          Complications: No notable events documented.

## 2021-12-18 NOTE — Interval H&P Note (Signed)
History and Physical Interval Note:  12/18/2021 7:23 AM  Amber Cross  has presented today for surgery, with the diagnosis of lung nodule.  The various methods of treatment have been discussed with the patient and family. After consideration of risks, benefits and other options for treatment, the patient has consented to  Procedure(s) with comments: ROBOTIC ASSISTED NAVIGATIONAL BRONCHOSCOPY (Right) - ION w/ CIOS as a surgical intervention.  The patient's history has been reviewed, patient examined, no change in status, stable for surgery.  I have reviewed the patient's chart and labs.  Questions were answered to the patient's satisfaction.     [place]

## 2021-12-18 NOTE — Op Note (Signed)
Video Bronchoscopy with Robotic Assisted Bronchoscopic Navigation   Date of Operation: 12/18/2021   Pre-op Diagnosis: Right upper lobe lung nodule  Post-op Diagnosis: Right upper lobe lung nodule  Surgeon: Garner Nash, DO   Assistants: None   Anesthesia: General endotracheal anesthesia  Operation: Flexible video fiberoptic bronchoscopy with robotic assistance and biopsies.  Estimated Blood Loss: Minimal  Complications: None  Indications and History: Amber Cross is a 61 y.o. female with history of right upper lobe lung nodule. The risks, benefits, complications, treatment options and expected outcomes were discussed with the patient.  The possibilities of pneumothorax, pneumonia, reaction to medication, pulmonary aspiration, perforation of a viscus, bleeding, failure to diagnose a condition and creating a complication requiring transfusion or operation were discussed with the patient who freely signed the consent.    Description of Procedure: The patient was seen in the Preoperative Area, was examined and was deemed appropriate to proceed.  The patient was taken to [facility] endoscopy room 3, identified as Amber Cross and the procedure verified as Flexible Video Fiberoptic Bronchoscopy.  A Time Out was held and the above information confirmed.   Prior to the date of the procedure a high-resolution CT scan of the chest was performed. Utilizing ION software program a virtual tracheobronchial tree was generated to allow the creation of distinct navigation pathways to the patient's parenchymal abnormalities. After being taken to the operating room general anesthesia was initiated and the patient  was orally intubated. The video fiberoptic bronchoscope was introduced via the endotracheal tube and a general inspection was performed which showed normal right and left lung anatomy, aspiration of the bilateral mainstems was completed to remove any remaining secretions. Robotic catheter inserted  into patient's endotracheal tube.   Target #1 right upper lobe: The distinct navigation pathways prepared prior to this procedure were then utilized to navigate to patient's lesion identified on CT scan. The robotic catheter was secured into place and the vision probe was withdrawn.  Lesion location was approximated using fluoroscopy, three-dimensional cone beam CT imaging, and radial endobronchial ultrasound for peripheral targeting. Under fluoroscopic guidance transbronchial needle brushings, transbronchial needle biopsies, and transbronchial forceps biopsies were performed to be sent for cytology and pathology.  Following tissue sampling a single fiducial was placed under fluoroscopic guidance using a fiducial catheter wire and delivery kit.  Fiducial was placed within proximity of the lesion.   At the end of the procedure a general airway inspection was performed and there was no evidence of active bleeding. The bronchoscope was removed.  The patient tolerated the procedure well. There was no significant blood loss and there were no obvious complications. A post-procedural chest x-ray is pending.  Samples Target #1: 1. Transbronchial needle brushings from right upper lobe 2. Transbronchial Wang needle biopsies from right upper lobe 3. Transbronchial forceps biopsies from right upper lobe  Plans:  The patient will be discharged from the PACU to home when recovered from anesthesia and after chest x-ray is reviewed. We will review the cytology, pathology results with the patient when they become available. Outpatient followup will be with Garner Nash, DO.  Garner Nash, DO [facility] 12/18/2021 8:30 AM

## 2021-12-18 NOTE — Telephone Encounter (Signed)
Called patient to schedule a consultation w. Dr. Tammi Klippel. No answer, LVM for a return call.

## 2021-12-18 NOTE — Anesthesia Postprocedure Evaluation (Signed)
Anesthesia Post Note  Patient: Amber Cross  Procedure(s) Performed: ROBOTIC ASSISTED NAVIGATIONAL BRONCHOSCOPY (Right) BRONCHIAL NEEDLE ASPIRATION BIOPSIES BRONCHIAL BIOPSIES BRONCHIAL BRUSHINGS FIDUCIAL MARKER PLACEMENT RADIAL ENDOBRONCHIAL ULTRASOUND     Patient location during evaluation: PACU Anesthesia Type: General Level of consciousness: awake and alert Pain management: pain level controlled Vital Signs Assessment: post-procedure vital signs reviewed and stable Respiratory status: spontaneous breathing, nonlabored ventilation and respiratory function stable Cardiovascular status: blood pressure returned to baseline and stable Postop Assessment: no apparent nausea or vomiting Anesthetic complications: no   No notable events documented.  Last Vitals:  Vitals:   12/18/21 0850 12/18/21 0905  BP: 134/67 (!) 161/73  Pulse: (!) 59 (!) 58  Resp: 19 17  Temp:  36.5 C  SpO2: 94% 92%    Last Pain:  Vitals:   12/18/21 0905  TempSrc:   PainSc: 0-No pain                 Kaisha Wachob,W. EDMOND

## 2021-12-19 ENCOUNTER — Ambulatory Visit (HOSPITAL_COMMUNITY)
Admission: RE | Admit: 2021-12-19 | Discharge: 2021-12-19 | Disposition: A | Discharge status: Home / Self Care | Payer: 59 | Source: Ambulatory Visit | Attending: Internal Medicine | Admitting: Internal Medicine

## 2021-12-19 ENCOUNTER — Encounter (HOSPITAL_COMMUNITY): Payer: Self-pay | Admitting: Pulmonary Disease

## 2021-12-19 DIAGNOSIS — D649 Anemia, unspecified: Secondary | ICD-10-CM | POA: Insufficient documentation

## 2021-12-19 LAB — CBC
HCT: 22.9 % — ABNORMAL LOW (ref 36.0–46.0)
HCT: 24.3 % — ABNORMAL LOW (ref 36.0–46.0)
Hemoglobin: 7.5 g/dL — ABNORMAL LOW (ref 12.0–15.0)
Hemoglobin: 8 g/dL — ABNORMAL LOW (ref 12.0–15.0)
MCH: 28.6 pg (ref 26.0–34.0)
MCH: 28.8 pg (ref 26.0–34.0)
MCHC: 32.8 g/dL (ref 30.0–36.0)
MCHC: 32.9 g/dL (ref 30.0–36.0)
MCV: 87.4 fL (ref 80.0–100.0)
MCV: 87.4 fL (ref 80.0–100.0)
Platelets: 230 10*3/uL (ref 150–400)
Platelets: 220 10*3/uL (ref 150–400)
RBC: 2.62 MIL/uL — ABNORMAL LOW (ref 3.87–5.11)
RBC: 2.78 MIL/uL — ABNORMAL LOW (ref 3.87–5.11)
RDW: 14.9 % (ref 11.5–15.5)
RDW: 14.6 % (ref 11.5–15.5)
WBC: 11.6 10*3/uL — ABNORMAL HIGH (ref 4.0–10.5)
WBC: 10.2 10*3/uL (ref 4.0–10.5)
nRBC: 0 % (ref 0.0–0.2)
nRBC: 0 % (ref 0.0–0.2)

## 2021-12-19 LAB — ABO/RH: ABO/RH(D): O POS

## 2021-12-19 LAB — PREPARE RBC (CROSSMATCH)

## 2021-12-19 LAB — CYTOLOGY - NON PAP

## 2021-12-19 MED ORDER — SODIUM CHLORIDE 0.9% IV SOLUTION: Freq: Once | INTRAVENOUS | Status: DC

## 2021-12-20 LAB — BPAM RBC
Blood Product Expiration Date: 202305302359
ISSUE DATE / TIME: 202305241019
Unit Type and Rh: 9500

## 2021-12-20 LAB — TYPE AND SCREEN
ABO/RH(D): O POS
Antibody Screen: NEGATIVE
Unit division: 0

## 2021-12-25 ENCOUNTER — Ambulatory Visit (INDEPENDENT_AMBULATORY_CARE_PROVIDER_SITE_OTHER): Payer: 59 | Admitting: Acute Care

## 2021-12-25 ENCOUNTER — Telehealth: Payer: Self-pay | Admitting: Acute Care

## 2021-12-25 ENCOUNTER — Telehealth: Payer: Self-pay | Admitting: *Deleted

## 2021-12-25 ENCOUNTER — Encounter: Payer: Self-pay | Admitting: Acute Care

## 2021-12-25 VITALS — BP 164/70 | HR 81 | Temp 98.3°F | Ht 67.5 in | Wt 139.0 lb

## 2021-12-25 DIAGNOSIS — F17210 Nicotine dependence, cigarettes, uncomplicated: Secondary | ICD-10-CM

## 2021-12-25 DIAGNOSIS — K529 Noninfective gastroenteritis and colitis, unspecified: Secondary | ICD-10-CM

## 2021-12-25 DIAGNOSIS — Z720 Tobacco use: Secondary | ICD-10-CM

## 2021-12-25 DIAGNOSIS — C3490 Malignant neoplasm of unspecified part of unspecified bronchus or lung: Secondary | ICD-10-CM | POA: Diagnosis not present

## 2021-12-25 NOTE — Progress Notes (Signed)
History of Present Illness Amber Cross is a 61 y.o. female current every day smoker ( 40 pack year history) with past medical history of diabetes, hypercholesterolemia, hypertension. She was referred to Dr. Valeta Harms for follow up CT imaging of a  9 mm subpleural nodule within the right upper lobe. As CT Chest 10/2021 was concerning for bronchogenic cancer, she was scheduled for a robotic assisted navigational bronchoscopy with tissue sampling per Dr. Valeta Harms. She is here for follow up.    12/25/2021 Pt. Presents for follow up after bronchoscopy done 12/18/2021. She was 21 minutes late to her appointment today, but was seen because this was a post procedure follow up. She states she has had some diarrhea today, which contributed to her having difficulty getting to the office. . I have asked her to follow up with her PCP for a GI consult. This has been a chronic problem, not new since her procedure, but I think she needs a colonoscopy to better evaluate. . She has had  no bleeding or sore throat since her procedure. We have reviewed her biopsy results. They were posuitive for small cell carcinoma. She is scheduled to see Dr. Tammi Klippel to be evaluated for radiation therapy to the malignant nodule on 01/01/2022. She verbalized understanding of appointment . She does continue to smoke daily. I have counseled her to quit. She understands the continued risk of smoking in the setting of a newly diagnosed lung cancer. She is with her niece today, who has just recently been diagnosed with breast cancer, and she has been through chemotherapy, she is a wonderful support person for her.    Test Results: 12/18/2021 FINAL MICROSCOPIC DIAGNOSIS:  A. LUNG, RUL, NEEDLE  BIOPSY:  - Malignant  - Small cell carcinoma   B. LUNG, RUL, BRUSHING:  - Rare atypical small cells suspicious for tumor, scant cellularity   Chest Imaging: 11/06/2021 CT chest: Right upper lobe lateral 9 mm lobulated lung nodule concerning for primary  bronchogenic carcinoma.     Latest Ref Rng & Units 12/19/2021    2:17 PM 12/19/2021    8:13 AM 12/18/2021    6:26 AM  CBC  WBC 4.0 - 10.5 K/uL 10.2   11.6     Hemoglobin 12.0 - 15.0 g/dL 8.0   7.5   7.5    Hematocrit 36.0 - 46.0 % 24.3   22.9   22.0    Platelets 150 - 400 K/uL 220   230          Latest Ref Rng & Units 12/18/2021    6:26 AM 11/29/2021    9:48 AM 11/23/2021    2:18 PM  BMP  Glucose 70 - 99 mg/dL 205   61     BUN 6 - 20 mg/dL 32   29     Creatinine 0.44 - 1.00 mg/dL 2.00   1.78     BUN/Creat Ratio 9 - 23  16     Sodium 135 - 145 mmol/L 138   140     Potassium 3.5 - 5.1 mmol/L 3.2   3.6   3.9    Chloride 98 - 111 mmol/L 102   106     CO2 20 - 29 mmol/L  24     Calcium 8.7 - 10.2 mg/dL  8.3       BNP    Component Value Date/Time   BNP 335.2 (H) 11/21/2021 1533    ProBNP No results found for: PROBNP  PFT No results found  for: FEV1PRE, FEV1POST, FVCPRE, FVCPOST, TLC, DLCOUNC, PREFEV1FVCRT, PSTFEV1FVCRT  NM PET Image Initial (PI) Skull Base To Thigh (F-18 FDG)  Result Date: 12/13/2021 CLINICAL DATA:  Initial treatment strategy for right lung nodule. EXAM: NUCLEAR MEDICINE PET SKULL BASE TO THIGH TECHNIQUE: 5.8 mCi F-18 FDG was injected intravenously. Full-ring PET imaging was performed from the skull base to thigh after the radiotracer. CT data was obtained and used for attenuation correction and anatomic localization. Fasting blood glucose: 224 mg/dl COMPARISON:  CT on 09/27/2021 FINDINGS: Mediastinal blood-pool activity (background): SUV max = 1.9 Liver activity (reference): SUV max = N/A NECK:  No hypermetabolic lymph nodes or masses. Incidental CT findings:  None. CHEST: No hypermetabolic lymph nodes. 9 mm pulmonary nodule in the lateral right lung apex shows mild hypermetabolism, with SUV max of 2.1. No other suspicious pulmonary nodules seen on CT images. Incidental CT findings: Mild goiter without focal hypermetabolism. Small pericardial effusion. Tiny bilateral  pleural effusions. ABDOMEN/PELVIS: No abnormal hypermetabolic activity within the liver, pancreas, adrenal glands, or spleen. No hypermetabolic lymph nodes in the abdomen or pelvis. Incidental CT findings: Minimal ascites and diffuse body wall edema, consistent with anasarca. Aortic atherosclerotic calcification incidentally noted. SKELETON: No focal hypermetabolic bone lesions to suggest skeletal metastasis. Incidental CT findings:  None. IMPRESSION: 9 mm right apical pulmonary nodule shows mild hypermetabolism, suspicious for primary bronchogenic carcinoma. No evidence of metastatic disease. Anasarca. Aortic Atherosclerosis (ICD10-I70.0). Electronically Signed   By: Marlaine Hind M.D.   On: 12/13/2021 09:04   DG Chest Port 1 View  Result Date: 12/18/2021 CLINICAL DATA:  Status post bronchoscopy EXAM: PORTABLE CHEST 1 VIEW COMPARISON:  11/21/2021 FINDINGS: Cardiomegaly. Probable small bilateral pleural effusions. Biopsy marking clip within a right apical nodule. No pneumothorax. The visualized skeletal structures are unremarkable. IMPRESSION: 1. Biopsy marking clip within a right apical nodule. No pneumothorax. 2. Cardiomegaly with probable small bilateral pleural effusions. Electronically Signed   By: Delanna Ahmadi M.D.   On: 12/18/2021 09:08   DG C-ARM BRONCHOSCOPY  Result Date: 12/18/2021 C-ARM BRONCHOSCOPY: Fluoroscopy was utilized by the requesting physician.  No radiographic interpretation.     Past medical hx Past Medical History:  Diagnosis Date   Anemia    Arthritis    CHF (congestive heart failure) ([place])    Chronic kidney disease    Depression    Diabetes mellitus    Headache    Hypercholesteremia    Hypertension    Stroke ([place]) 10/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   Tobacco abuse      Social History   Tobacco Use   Smoking status: Every Day    Packs/day: 0.50    Years: 40.00    Pack years: 20.00    Types: Cigarettes   Smokeless tobacco: Never  Vaping Use   Vaping Use: Never used   Substance Use Topics   Alcohol use: Not Currently   Drug use: Yes    Frequency: 4.0 times per week    Types: Marijuana    Comment: smokes every other day    Ms.Keahey reports that she has been smoking cigarettes. She has a 20.00 pack-year smoking history. She has never used smokeless tobacco. She reports that she does not currently use alcohol. She reports current drug use. Frequency: 4.00 times per week. Drug: Marijuana.  Tobacco Cessation: Current every day smoker with a 20 + pack year smoking history  Past surgical hx, Family hx, Social hx all reviewed.  Current Outpatient Medications on File Prior to Visit  Medication Sig   acetaminophen (TYLENOL) 500 MG tablet Take 1,000 mg by mouth every 6 (six) hours as needed for mild pain.   albuterol (VENTOLIN HFA) 108 (90 Base) MCG/ACT inhaler Inhale 2 puffs into the lungs every 6 (six) hours as needed for wheezing or shortness of breath.   amLODipine (NORVASC) 5 MG tablet Take 1 tablet (5 mg total) by mouth daily.   aspirin EC 81 MG EC tablet Take 1 tablet (81 mg total) by mouth daily. Swallow whole.   Bayer Microlet Lancets lancets Check blood sugar up to 3 times a day as instructed   carvedilol (COREG) 25 MG tablet Take 50 mg by mouth 2 (two) times daily with a meal.   Cholecalciferol (VITAMIN D) 50 MCG (2000 UT) tablet Take 2,000 Units by mouth daily.   DULoxetine (CYMBALTA) 60 MG capsule Take 1 capsule (60 mg total) by mouth daily.   furosemide (LASIX) 40 MG tablet Take 1.5 tablets (60 mg total) by mouth daily.   glucose blood test strip Check blood sugar up to 3 times a day as instructed   Insulin Pen Needle (UNIFINE PENTIPS) 32G X 4 MM MISC Use in the morning and at bedtime   losartan (COZAAR) 100 MG tablet Take 100 mg by mouth daily.   Multiple Vitamin (MULTI-VITAMIN) tablet Take 1 tablet by mouth daily.   nitroGLYCERIN (NITROSTAT) 0.4 MG SL tablet Place 0.4 mg under the tongue every 5 (five) minutes as needed for chest pain.    pantoprazole (PROTONIX) 40 MG tablet Take 1 tablet (40 mg total) by mouth daily.   rosuvastatin (CRESTOR) 40 MG tablet Take 1 tablet (40 mg total) by mouth daily.   sucralfate (CARAFATE) 1 g tablet Take 1 tablet (1 g total) by mouth 4 (four) times daily -  with meals and at bedtime. (Patient taking differently: Take 1 g by mouth 2 (two) times daily.)   gabapentin (NEURONTIN) 300 MG capsule Take 1 capsule (300 mg total) by mouth daily.   insulin isophane & regular human KwikPen (NOVOLIN 70/30 KWIKPEN) (70-30) 100 UNIT/ML KwikPen Inject 10 Units into the skin 2 (two) times daily before lunch and supper.   No current facility-administered medications on file prior to visit.     Allergies  Allergen Reactions   Penicillins Itching   Strawberry (Diagnostic) Itching   Tomato Itching    Review Of Systems:  Constitutional:   +  weight loss, no night sweats,  Fevers, chills, + fatigue, or  lassitude.  HEENT:   No headaches,  Difficulty swallowing,  Tooth/dental problems, or  Sore throat,                No sneezing, itching, ear ache, nasal congestion, post nasal drip,   CV:  No chest pain,  Orthopnea, PND, swelling in lower extremities, anasarca, dizziness, palpitations, syncope.   GI  No heartburn, indigestion, abdominal pain, nausea, vomiting, ++ diarrhea, ++ change in bowel habits, ++ loss of appetite, bloody stools.   Resp: No shortness of breath with exertion or at rest.  No excess mucus, no productive cough,  No non-productive cough,  No coughing up of blood.  No change in color of mucus.  No wheezing.  No chest wall deformity  Skin: no rash or lesions.  GU: no dysuria, change in color of urine, no urgency or frequency.  No flank pain, no hematuria   MS:  No joint pain or swelling.  No decreased range of motion.  No back pain.  Psych:  No change in mood or affect. No depression or anxiety.  No memory loss.   Vital Signs BP (!) 164/70 (BP Location: Right Arm, Cuff Size: Normal)    Pulse 81   Temp 98.3 F (36.8 C) (Temporal)   Ht 5' 7.5" (1.715 m)   Wt 139 lb (63 kg)   LMP 09/29/2011   SpO2 99%   BMI 21.45 kg/m    Physical Exam:  General- No distress,  A&O x 3, pleasant elderly female in a wheelchair ENT: No sinus tenderness, TM clear, pale nasal mucosa, no oral exudate,no post nasal drip, no LAN Cardiac: S1, S2, regular rate and rhythm, no murmur Chest: No wheeze/ rales/ dullness; no accessory muscle use, no nasal flaring, no sternal retractions Abd.: Soft Non-tender, ND, BS ++, Body mass index is 21.45 kg/m.  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 You have been referred to radiation. Follow up appointment with Dr. Tammi Klippel 01/01/2022. Follow up in 6 months with Dr. Valeta Harms or Judson Roch.  Please work on quitting smoking.  This is the single most powerful action that you can take to decrease your risk of lung cancer, stroke and heart disease.  Call if you need [place] sooner for breathing issues.  Please contact office for sooner follow up if symptoms do not improve or worsen or seek emergency care   Diarrhea and GI issues Plan Please follow up with the  GI physician in [place]  that you had an appointment to see. Drink plenty of fluids while you are having diarrhea.  Be careful when standing  I spent 40 minutes dedicated to the care of this patient on the date of this encounter to include pre-visit review of records, face-to-face time with the patient discussing conditions above, post visit ordering of testing, clinical documentation with the electronic health record, making appropriate referrals as documented, and  communicating necessary information to the patient's healthcare team.     Magdalen Spatz, NP 12/25/2021  1:19 PM

## 2021-12-25 NOTE — Patient Instructions (Addendum)
It is good to see you today.  Your biopsy results have shown Small cell carcinoma . You have been referred to radiation. Follow up appointment with Dr. Tammi Klippel 01/01/2022. Follow up in 6 months with Dr. Valeta Harms or Judson Roch.  Please work on quitting smoking.  This is the single most powerful action that you can take to decrease your risk of lung cancer, stroke and heart disease.  Call if you need [place] sooner for breathing issues.  Please contact office for sooner follow up if symptoms do not improve or worsen or seek emergency care   Drink plenty of fluids while you are having diarrhea.

## 2021-12-25 NOTE — Telephone Encounter (Signed)
Call from pt c/o diarrhea with blood. Stated she had mentioned it before at the last visit; worse yesterday "pooping all day with blood in it". I asked if she feels weak and dizzy, someone in the background stated yes then pt stated yes also. No available appts today - Advised pt to go to the ER; person in background stated they will take her and thank you.

## 2021-12-26 ENCOUNTER — Telehealth: Payer: Self-pay | Admitting: Student

## 2021-12-26 NOTE — Progress Notes (Signed)
Thoracic Location of Tumor / Histology: Right Upper Lobe Lung Ca  Biopsies  Dr. Valeta Harms (12/18/2021) FINAL MICROSCOPIC DIAGNOSIS:  A. LUNG, RUL, NEEDLE  BIOPSY:  - Malignant  - Small cell carcinoma   B. LUNG, RUL, BRUSHING:  - Rare atypical small cells suspicious for tumor, scant cellularity    Chest Imaging: 11/06/2021 CT chest: Right upper lobe lateral 9 mm lobulated lung nodule concerning for primary bronchogenic carcinoma.   Tobacco/Marijuana/Snuff/ETOH use: Current every day smoker (20pk/yr history), never used smokeless tobacco. She reports that she does not currently use alcohol. She reports current drug use. Frequency: 4.00 times per week. Drug: Marijuana.  Past/Anticipated interventions by cardiothoracic surgery, if any:   Dr. Valeta Harms 12/18/2021:  Video Bronchoscopy with Robotic Assisted Bronchoscopic Navigation  Past/Anticipated interventions by medical oncology, if any:  NA  Signs/Symptoms Weight changes, if any: No Respiratory complaints, if any: No Hemoptysis, if any:  No Pain issues, if any:  8/10 bilateral legs  SAFETY ISSUES: Prior radiation? No Pacemaker/ICD? No  Possible current pregnancy?No Is the patient on methotrexate? No  Current Complaints / other details:

## 2021-12-26 NOTE — Telephone Encounter (Signed)
Please call the patient back. Patient states she was instructed by her Pulmonary Dr to call her PCP in reference to her diarrhea (Loose stools are just pouring out without warning) and bright re (Bleeding).  Pt is unclear if the blood is coming from her vagina or Rectum.    Pt states she took about 11 showers on Monday because it just pouring and she was trying to stay clean.

## 2021-12-26 NOTE — Telephone Encounter (Signed)
Patient transferred to triage. States 3 day hx of diarrhea with BRB on tissue when she wipes. No visible blood in toilet. Last Hgb 8.0 on 12/19/21. Patient with recent "mini stroke" (10/2021), and recent dx of lung cancer (12/18/21). There are no openings in clinic till 12/31/21. She is advised to head directly to ED. She will call someone to take her now.

## 2021-12-27 ENCOUNTER — Other Ambulatory Visit: Payer: Self-pay | Admitting: *Deleted

## 2021-12-27 ENCOUNTER — Encounter: Payer: Self-pay | Admitting: *Deleted

## 2021-12-27 NOTE — Progress Notes (Signed)
The proposed treatment discussed in cancer conference is for discussion purpose only and is not a binding recommendation. The patient was not physically examined nor present for their treatment options. Therefore, final treatment plans cannot be decided.  ?

## 2021-12-31 NOTE — Progress Notes (Signed)
Radiation Oncology         [phone] ________________________________  Initial outpatient Consultation  Name: Amber Cross MRN: [id number]  Date of Service: 01/01/2022 DOB: October 29, 1960  PR:FFMBWG, Alease Frame, DO  Icard, [name], DO   REFERRING PHYSICIAN: Garner Nash, DO  DIAGNOSIS: There were no encounter diagnoses.  No diagnosis found.  HISTORY OF PRESENT ILLNESS: Amber Cross is a 61 y.o. female seen at the request of Dr. Valeta Harms. She was initially noted to have a small less than 5 mm nodule in the right upper lobe during work up for chest pain and shortness of breath following a fall in 02/2021. Repeat CT C/A/P on 09/27/21 for recurrent chest pain showed enlargement of the right upper lobe nodule to 9 mm. She was referred to pulmonology, and PET scan was ordered but never completed due to insurance issues. She was later seen in the ED on 11/06/21 for hemoptysis and subsequently admitted; the nodule was stable on CT C/A/P at that time.  She was referred to Dr. Valeta Harms on 12/03/21, who recommended PET scan and bronchoscopy. 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  PREVIOUS RADIATION THERAPY: No  PAST MEDICAL HISTORY:  Past Medical History:  Diagnosis Date   Anemia    Arthritis    CHF (congestive heart failure) ([place])    Chronic kidney disease    Depression    Diabetes mellitus    Headache    Hypercholesteremia    Hypertension    Stroke ([place]) 10/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   Tobacco abuse       PAST SURGICAL HISTORY: Past Surgical History:  Procedure Laterality Date   ANTERIOR CRUCIATE LIGAMENT REPAIR Right    BRONCHIAL BIOPSY  12/18/2021   Procedure: BRONCHIAL BIOPSIES;  Surgeon: Garner Nash, DO;  Location: [facility];  Service:  Pulmonary;;   BRONCHIAL BRUSHINGS  12/18/2021   Procedure: BRONCHIAL BRUSHINGS;  Surgeon: Garner Nash, DO;  Location: [facility];  Service: Pulmonary;;   BRONCHIAL NEEDLE ASPIRATION BIOPSY  12/18/2021   Procedure: BRONCHIAL NEEDLE ASPIRATION BIOPSIES;  Surgeon: Garner Nash, DO;  Location: [facility];  Service: Pulmonary;;   CATARACT EXTRACTION     FIDUCIAL MARKER PLACEMENT  12/18/2021   Procedure: FIDUCIAL MARKER PLACEMENT;  Surgeon: Garner Nash, DO;  Location: [facility];  Service: Pulmonary;;   OVARY SURGERY     RIGHT OOPHORECTOMY Right    SMALL INTESTINE SURGERY     TUBAL LIGATION     VIDEO BRONCHOSCOPY WITH RADIAL ENDOBRONCHIAL ULTRASOUND  12/18/2021   Procedure: RADIAL ENDOBRONCHIAL ULTRASOUND;  Surgeon: Garner Nash, DO;  Location: [facility];  Service: Pulmonary;;    FAMILY HISTORY:  Family History  Problem Relation Age of Onset   Diabetes Mother    Hypertension Mother    Hypertension Father     SOCIAL HISTORY:  Social History   Socioeconomic History   Marital status: Married    Spouse name: Not on file   Number of children: Not on file   Years of education: Not on file   Highest education level: Not on file  Occupational History   Not on file  Tobacco Use   Smoking status: Every Day    Packs/day: 0.50    Years: 40.00    Pack years:  20.00    Types: Cigarettes   Smokeless tobacco: Never  Vaping Use   Vaping Use: Never used  Substance and Sexual Activity   Alcohol use: Not Currently   Drug use: Yes    Frequency: 4.0 times per week    Types: Marijuana    Comment: smokes every other day   Sexual activity: Not Currently    Birth control/protection: Abstinence  Other Topics Concern   Not on file  Social History Narrative   Not on file   Social Determinants of Health   Financial Resource Strain: Not on file  Food Insecurity: Not on file  Transportation Needs: Not on file  Physical Activity: Not on file  Stress: Not on file  Social  Connections: Not on file  Intimate Partner Violence: Not on file    ALLERGIES: Penicillins, Strawberry (diagnostic), and Tomato  MEDICATIONS:  Current Outpatient Medications  Medication Sig Dispense Refill   acetaminophen (TYLENOL) 500 MG tablet Take 1,000 mg by mouth every 6 (six) hours as needed for mild pain.     albuterol (VENTOLIN HFA) 108 (90 Base) MCG/ACT inhaler Inhale 2 puffs into the lungs every 6 (six) hours as needed for wheezing or shortness of breath.     amLODipine (NORVASC) 5 MG tablet Take 1 tablet (5 mg total) by mouth daily. 30 tablet 1   aspirin EC 81 MG EC tablet Take 1 tablet (81 mg total) by mouth daily. Swallow whole. 30 tablet 11   Bayer Microlet Lancets lancets Check blood sugar up to 3 times a day as instructed 100 each 12   carvedilol (COREG) 25 MG tablet Take 50 mg by mouth 2 (two) times daily with a meal.     Cholecalciferol (VITAMIN D) 50 MCG (2000 UT) tablet Take 2,000 Units by mouth daily.     DULoxetine (CYMBALTA) 60 MG capsule Take 1 capsule (60 mg total) by mouth daily. 30 capsule 0   furosemide (LASIX) 40 MG tablet Take 1.5 tablets (60 mg total) by mouth daily. 135 tablet 0   gabapentin (NEURONTIN) 300 MG capsule Take 1 capsule (300 mg total) by mouth daily. 30 capsule 0   glucose blood test strip Check blood sugar up to 3 times a day as instructed 100 each 12   insulin isophane & regular human KwikPen (NOVOLIN 70/30 KWIKPEN) (70-30) 100 UNIT/ML KwikPen Inject 10 Units into the skin 2 (two) times daily before lunch and supper. 6 mL 0   Insulin Pen Needle (UNIFINE PENTIPS) 32G X 4 MM MISC Use in the morning and at bedtime 100 each 3   losartan (COZAAR) 100 MG tablet Take 100 mg by mouth daily.     Multiple Vitamin (MULTI-VITAMIN) tablet Take 1 tablet by mouth daily.     nitroGLYCERIN (NITROSTAT) 0.4 MG SL tablet Place 0.4 mg under the tongue every 5 (five) minutes as needed for chest pain.     pantoprazole (PROTONIX) 40 MG tablet Take 1 tablet (40 mg  total) by mouth daily. 30 tablet 0   rosuvastatin (CRESTOR) 40 MG tablet Take 1 tablet (40 mg total) by mouth daily. 30 tablet 0   sucralfate (CARAFATE) 1 g tablet Take 1 tablet (1 g total) by mouth 4 (four) times daily -  with meals and at bedtime. (Patient taking differently: Take 1 g by mouth 2 (two) times daily.) 60 tablet 0   No current facility-administered medications for this encounter.    REVIEW OF SYSTEMS:  On review of systems, the patient reports that  she is doing well overall. She denies any chest pain***, shortness of breath, cough, fevers, chills, night sweats, unintended weight changes. She denies any bowel or bladder disturbances, and denies abdominal pain, nausea or vomiting. She denies any new musculoskeletal or joint aches or pains. A complete review of systems is obtained and is otherwise negative.    PHYSICAL EXAM:  Wt Readings from Last 3 Encounters:  12/25/21 139 lb (63 kg)  12/18/21 118 lb (53.5 kg)  12/11/21 129 lb 14.4 oz (58.9 kg)   Temp Readings from Last 3 Encounters:  12/25/21 98.3 F (36.8 C) (Temporal)  12/19/21 98 F (36.7 C) (Oral)  12/18/21 97.7 F (36.5 C)   BP Readings from Last 3 Encounters:  12/25/21 (!) 164/70  12/19/21 (!) 149/66  12/18/21 (!) 161/73   Pulse Readings from Last 3 Encounters:  12/25/21 81  12/19/21 65  12/18/21 (!) 58    /10  In general this is a well appearing *** woman in no acute distress. She is alert and oriented x4 and appropriate throughout the examination. HEENT reveals that the patient is normocephalic, atraumatic. EOMs are intact. PERRLA. Skin is intact without any evidence of gross lesions. Cardiovascular exam reveals a regular rate and rhythm, no clicks rubs or murmurs are auscultated. Chest is clear to auscultation bilaterally. Lymphatic assessment is performed and does not reveal any adenopathy in the cervical, supraclavicular, axillary, or inguinal chains. Abdomen has active bowel sounds in all quadrants and  is intact. The abdomen is soft, non tender, non distended. Lower extremities are negative for pretibial pitting edema, deep calf tenderness, cyanosis or clubbing.   KPS = ***  100 - Normal; no complaints; no evidence of disease. 90   - Able to carry on normal activity; minor signs or symptoms of disease. 80   - Normal activity with effort; some signs or symptoms of disease. 54   - Cares for self; unable to carry on normal activity or to do active work. 60   - Requires occasional assistance, but is able to care for most of his personal needs. 50   - Requires considerable assistance and frequent medical care. 76   - Disabled; requires special care and assistance. 9   - Severely disabled; hospital admission is indicated although death not imminent. 44   - Very sick; hospital admission necessary; active supportive treatment necessary. 10   - Moribund; fatal processes progressing rapidly. 0     - Dead  Karnofsky DA, Abelmann [place], Craver LS and Burchenal [facility] [phone]) The use of the nitrogen mustards in the palliative treatment of carcinoma: with particular reference to bronchogenic carcinoma Cancer 1 [id number]  LABORATORY DATA:  Lab Results  Component Value Date   WBC 10.2 12/19/2021   HGB 8.0 (L) 12/19/2021   HCT 24.3 (L) 12/19/2021   MCV 87.4 12/19/2021   PLT 220 12/19/2021   Lab Results  Component Value Date   NA 138 12/18/2021   K 3.2 (L) 12/18/2021   CL 102 12/18/2021   CO2 24 11/29/2021   Lab Results  Component Value Date   ALT 12 11/23/2021   AST 13 (L) 11/23/2021   GGT 28 11/22/2021   ALKPHOS 78 11/23/2021   BILITOT 0.3 11/23/2021     RADIOGRAPHY: NM PET Image Initial (PI) Skull Base To Thigh (F-18 FDG)  Result Date: 12/13/2021 CLINICAL DATA:  Initial treatment strategy for right lung nodule. EXAM: NUCLEAR MEDICINE PET SKULL BASE TO THIGH TECHNIQUE: 5.8 mCi F-18 FDG was injected intravenously.  Full-ring PET imaging was performed from the skull base to thigh after the  radiotracer. CT data was obtained and used for attenuation correction and anatomic localization. Fasting blood glucose: 224 mg/dl COMPARISON:  CT on 09/27/2021 FINDINGS: Mediastinal blood-pool activity (background): SUV max = 1.9 Liver activity (reference): SUV max = N/A NECK:  No hypermetabolic lymph nodes or masses. Incidental CT findings:  None. CHEST: No hypermetabolic lymph nodes. 9 mm pulmonary nodule in the lateral right lung apex shows mild hypermetabolism, with SUV max of 2.1. No other suspicious pulmonary nodules seen on CT images. Incidental CT findings: Mild goiter without focal hypermetabolism. Small pericardial effusion. Tiny bilateral pleural effusions. ABDOMEN/PELVIS: No abnormal hypermetabolic activity within the liver, pancreas, adrenal glands, or spleen. No hypermetabolic lymph nodes in the abdomen or pelvis. Incidental CT findings: Minimal ascites and diffuse body wall edema, consistent with anasarca. Aortic atherosclerotic calcification incidentally noted. SKELETON: No focal hypermetabolic bone lesions to suggest skeletal metastasis. Incidental CT findings:  None. IMPRESSION: 9 mm right apical pulmonary nodule shows mild hypermetabolism, suspicious for primary bronchogenic carcinoma. No evidence of metastatic disease. Anasarca. Aortic Atherosclerosis (ICD10-I70.0). Electronically Signed   By: Marlaine Hind M.D.   On: 12/13/2021 09:04   DG Chest Port 1 View  Result Date: 12/18/2021 CLINICAL DATA:  Status post bronchoscopy EXAM: PORTABLE CHEST 1 VIEW COMPARISON:  11/21/2021 FINDINGS: Cardiomegaly. Probable small bilateral pleural effusions. Biopsy marking clip within a right apical nodule. No pneumothorax. The visualized skeletal structures are unremarkable. IMPRESSION: 1. Biopsy marking clip within a right apical nodule. No pneumothorax. 2. Cardiomegaly with probable small bilateral pleural effusions. Electronically Signed   By: Delanna Ahmadi M.D.   On: 12/18/2021 09:08   DG C-ARM  BRONCHOSCOPY  Result Date: 12/18/2021 C-ARM BRONCHOSCOPY: Fluoroscopy was utilized by the requesting physician.  No radiographic interpretation.      IMPRESSION/PLAN: 52. 61 y.o. woman with RUL small cell carcinoma***  Today, we talked to the patient and family about the findings and workup thus far. We discussed the natural history of small cell carcinoma and general treatment, highlighting the role of radiotherapy in the management. We discussed the available radiation techniques, and focused on the details and logistics of delivery. In her case, the recommendation is for ***.We reviewed the anticipated acute and late sequelae associated with radiation in this setting. The patient was encouraged to ask questions that were answered to her satisfaction.  At the end of our conversation, the patient ***  I personally spent *** minutes in this encounter including chart review, reviewing radiological studies, meeting face-to-face with the patient, entering orders and completing documentation.   ------------------------------------------------   Tyler Pita, MD [place]: [phone]  Fax: [phone] [email or web address]  Skype  [email or web address]   This document serves as a record of services personally performed by Tyler Pita, MD. It was created on his behalf by Wilburn Mylar, a trained medical scribe. The creation of this record is based on the scribe's personal observations and the provider's statements to them. This document has been checked and approved by the attending provider.

## 2022-01-01 ENCOUNTER — Ambulatory Visit
Admission: RE | Admit: 2022-01-01 | Discharge: 2022-01-01 | Disposition: A | Discharge status: Home / Self Care | Payer: 59 | Source: Ambulatory Visit | Attending: Radiation Oncology | Admitting: Radiation Oncology

## 2022-01-01 ENCOUNTER — Other Ambulatory Visit: Payer: Self-pay

## 2022-01-01 VITALS — BP 206/85 | HR 84 | Temp 96.6°F | Resp 18 | Ht 67.5 in | Wt 138.0 lb

## 2022-01-01 DIAGNOSIS — I3139 Other pericardial effusion (noninflammatory): Secondary | ICD-10-CM | POA: Diagnosis not present

## 2022-01-01 DIAGNOSIS — R200 Anesthesia of skin: Secondary | ICD-10-CM | POA: Insufficient documentation

## 2022-01-01 DIAGNOSIS — C3411 Malignant neoplasm of upper lobe, right bronchus or lung: Secondary | ICD-10-CM | POA: Insufficient documentation

## 2022-01-01 DIAGNOSIS — E7800 Pure hypercholesterolemia, unspecified: Secondary | ICD-10-CM | POA: Insufficient documentation

## 2022-01-01 DIAGNOSIS — Z7982 Long term (current) use of aspirin: Secondary | ICD-10-CM | POA: Diagnosis not present

## 2022-01-01 DIAGNOSIS — Z794 Long term (current) use of insulin: Secondary | ICD-10-CM | POA: Insufficient documentation

## 2022-01-01 DIAGNOSIS — J90 Pleural effusion, not elsewhere classified: Secondary | ICD-10-CM | POA: Insufficient documentation

## 2022-01-01 DIAGNOSIS — K921 Melena: Secondary | ICD-10-CM | POA: Insufficient documentation

## 2022-01-01 DIAGNOSIS — E119 Type 2 diabetes mellitus without complications: Secondary | ICD-10-CM | POA: Diagnosis not present

## 2022-01-01 DIAGNOSIS — R188 Other ascites: Secondary | ICD-10-CM | POA: Diagnosis not present

## 2022-01-01 DIAGNOSIS — R531 Weakness: Secondary | ICD-10-CM | POA: Insufficient documentation

## 2022-01-01 DIAGNOSIS — F17210 Nicotine dependence, cigarettes, uncomplicated: Secondary | ICD-10-CM | POA: Diagnosis not present

## 2022-01-01 DIAGNOSIS — Z79899 Other long term (current) drug therapy: Secondary | ICD-10-CM | POA: Diagnosis not present

## 2022-01-01 DIAGNOSIS — I130 Hypertensive heart and chronic kidney disease with heart failure and stage 1 through stage 4 chronic kidney disease, or unspecified chronic kidney disease: Secondary | ICD-10-CM | POA: Insufficient documentation

## 2022-01-01 DIAGNOSIS — N189 Chronic kidney disease, unspecified: Secondary | ICD-10-CM | POA: Diagnosis not present

## 2022-01-01 DIAGNOSIS — I509 Heart failure, unspecified: Secondary | ICD-10-CM | POA: Diagnosis not present

## 2022-01-01 DIAGNOSIS — R2689 Other abnormalities of gait and mobility: Secondary | ICD-10-CM | POA: Insufficient documentation

## 2022-01-01 DIAGNOSIS — R1013 Epigastric pain: Secondary | ICD-10-CM | POA: Diagnosis not present

## 2022-01-01 DIAGNOSIS — Z8673 Personal history of transient ischemic attack (TIA), and cerebral infarction without residual deficits: Secondary | ICD-10-CM | POA: Diagnosis not present

## 2022-01-03 ENCOUNTER — Encounter (HOSPITAL_BASED_OUTPATIENT_CLINIC_OR_DEPARTMENT_OTHER): Payer: Self-pay

## 2022-01-03 ENCOUNTER — Emergency Department (HOSPITAL_BASED_OUTPATIENT_CLINIC_OR_DEPARTMENT_OTHER): Payer: 59

## 2022-01-03 ENCOUNTER — Other Ambulatory Visit: Payer: Self-pay

## 2022-01-03 ENCOUNTER — Emergency Department (HOSPITAL_BASED_OUTPATIENT_CLINIC_OR_DEPARTMENT_OTHER)
Admission: EM | Admit: 2022-01-03 | Discharge: 2022-01-03 | Disposition: A | Discharge status: Home / Self Care | Payer: 59 | Attending: Emergency Medicine | Admitting: Emergency Medicine

## 2022-01-03 DIAGNOSIS — R0789 Other chest pain: Secondary | ICD-10-CM | POA: Diagnosis present

## 2022-01-03 DIAGNOSIS — Z794 Long term (current) use of insulin: Secondary | ICD-10-CM | POA: Insufficient documentation

## 2022-01-03 DIAGNOSIS — Z79899 Other long term (current) drug therapy: Secondary | ICD-10-CM | POA: Diagnosis not present

## 2022-01-03 DIAGNOSIS — Z7982 Long term (current) use of aspirin: Secondary | ICD-10-CM | POA: Diagnosis not present

## 2022-01-03 DIAGNOSIS — M546 Pain in thoracic spine: Secondary | ICD-10-CM | POA: Diagnosis not present

## 2022-01-03 IMAGING — CT CT T SPINE W/O CM
2 of 3 series · 11 of 33 positions shown, 13 images · non-contrast
Comparison: MRI thoracic spine dated [DATE].

CLINICAL DATA: Sternal and rib pain after fall on [REDACTED]. History
of lung cancer.

EXAM:
CT THORACIC SPINE WITHOUT CONTRAST
TECHNIQUE: Multidetector CT images of the thoracic were obtained using the
standard protocol without intravenous contrast.
RADIATION DOSE REDUCTION: This exam was performed according to the
departmental dose-optimization program which includes automated
exposure control, adjustment of the mA and/or kV according to
patient size and/or use of iterative reconstruction technique.

[Series 1: coronal t-sp bone · coronal · 0.19mm/px · 3 of 63 slices shown]
[im 13/63  bone]
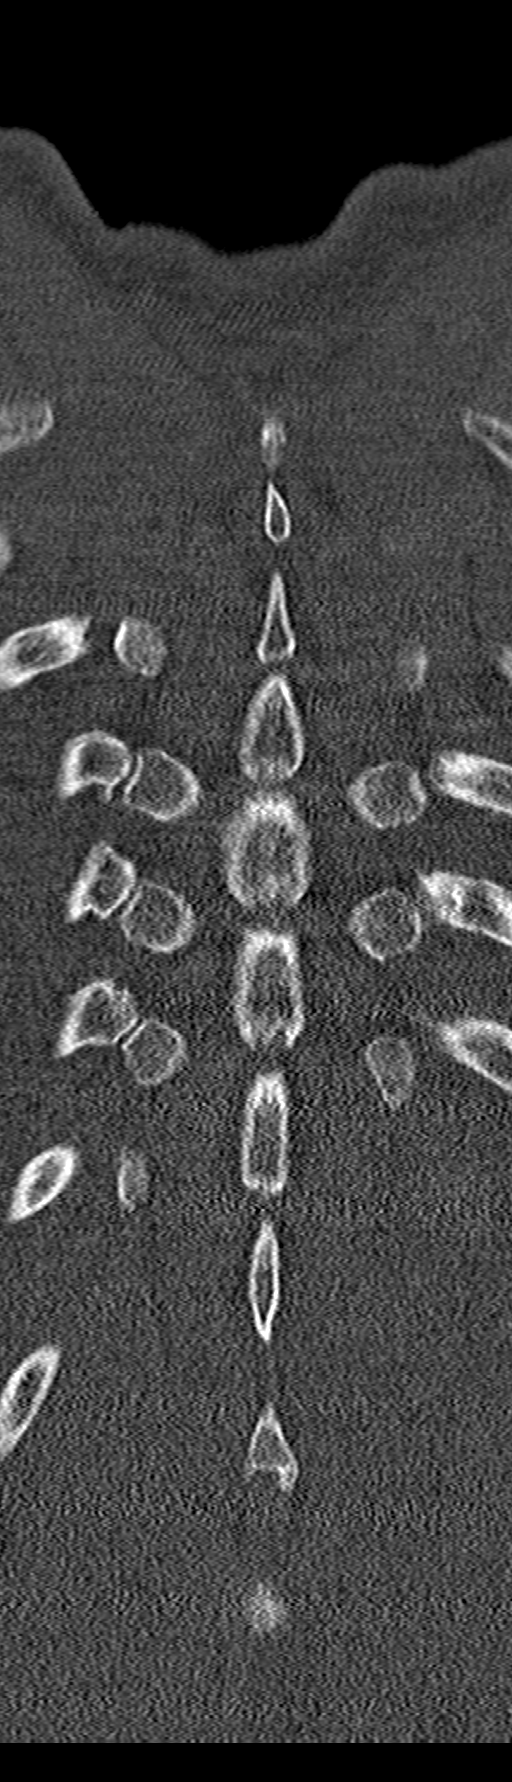
[im 25/63  bone]
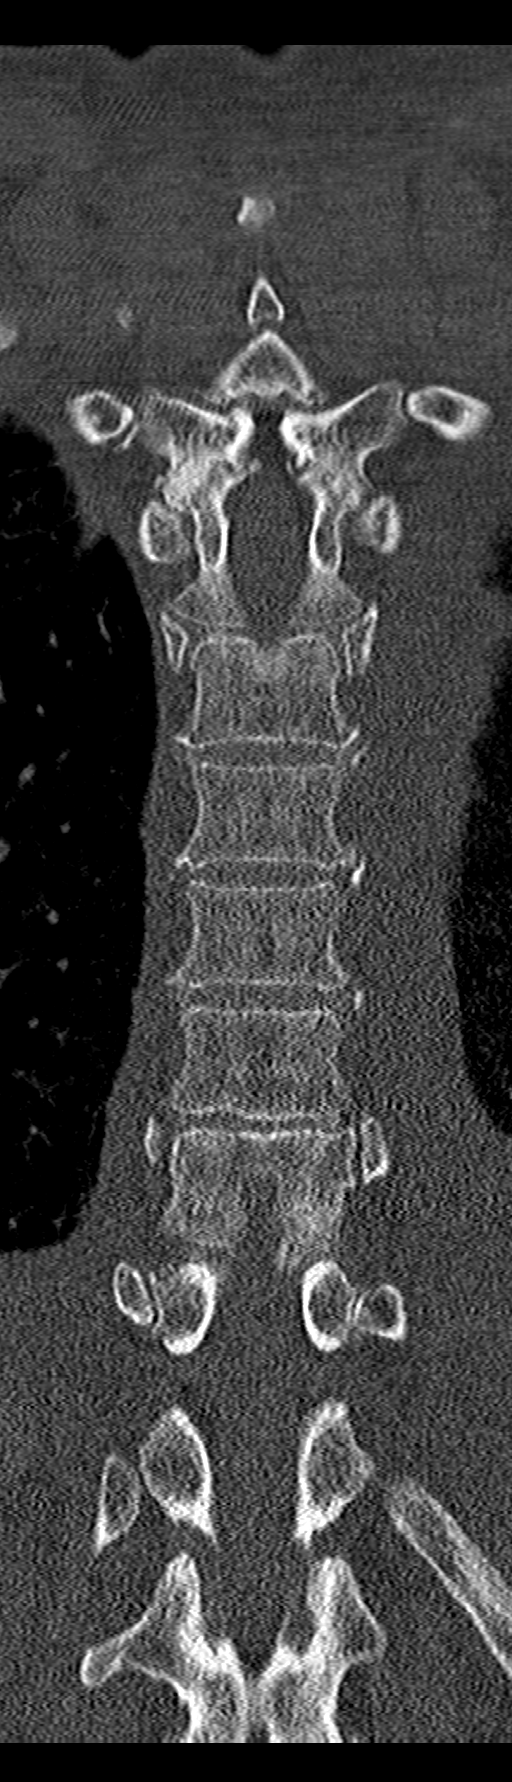
[im 38/63  bone]
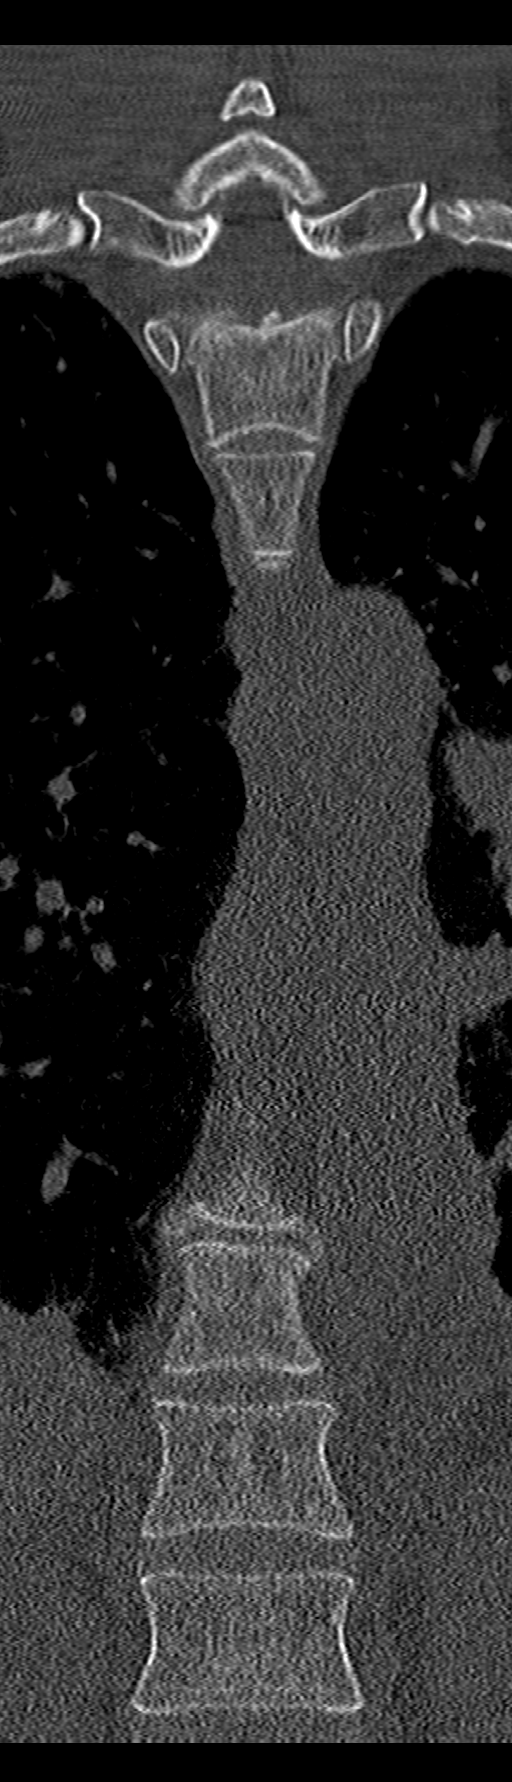

[Series 3: axial t-sp bone · axial · 0.27mm/px · z∈[-317,-43]mm · 8 of 163 slices shown, 10 images]
[im 13/163  soft-tissue]
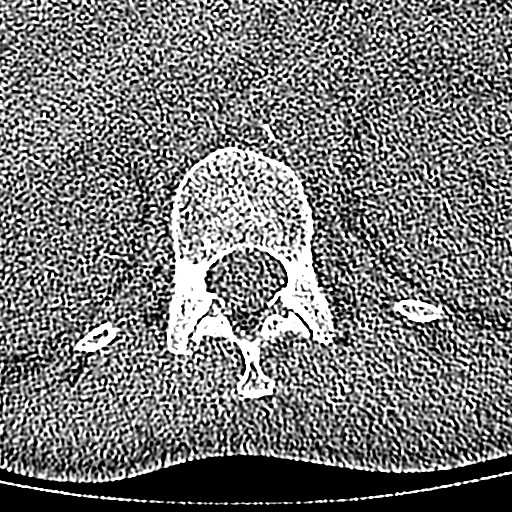
[im 13/163  bone]
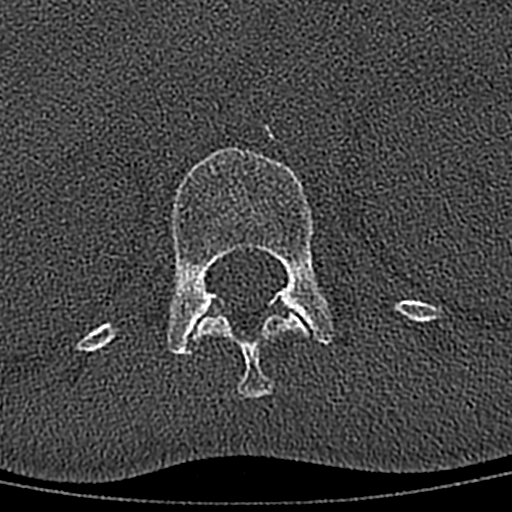
[im 38/163  bone]
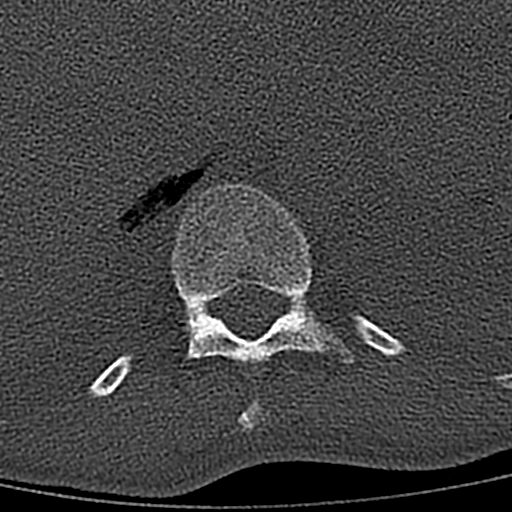
[im 50/163  bone]
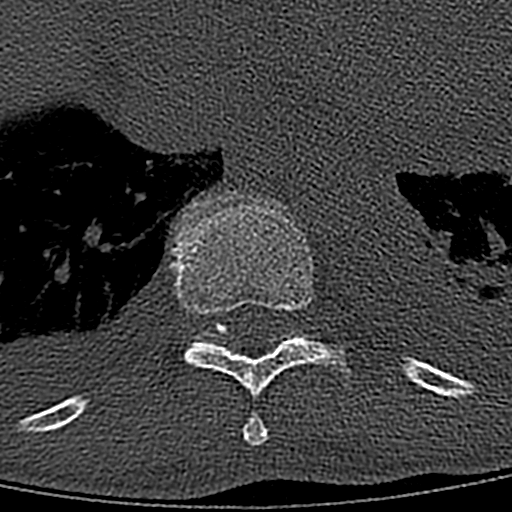
[im 75/163  bone]
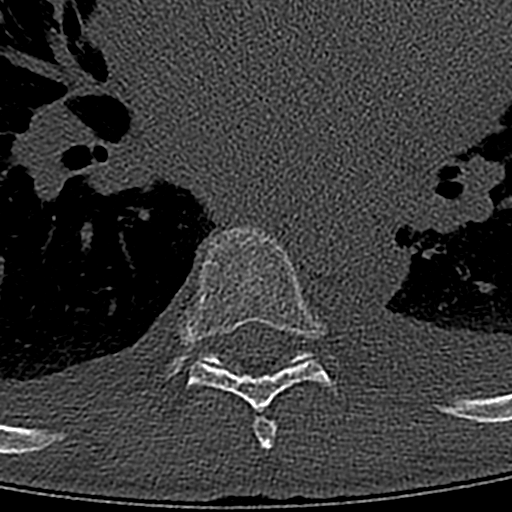
[im 88/163  soft-tissue]
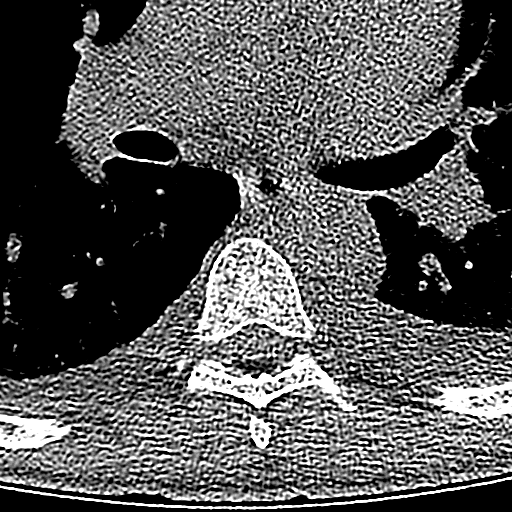
[im 88/163  bone]
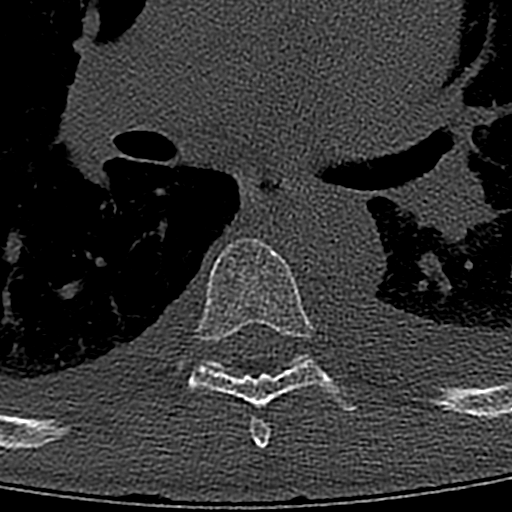
[im 113/163  bone]
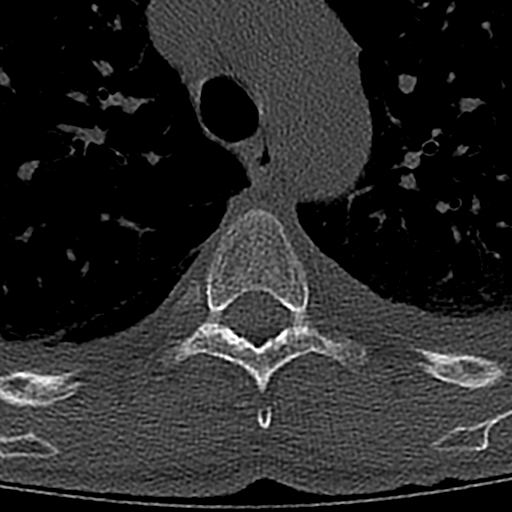
[im 125/163  bone]
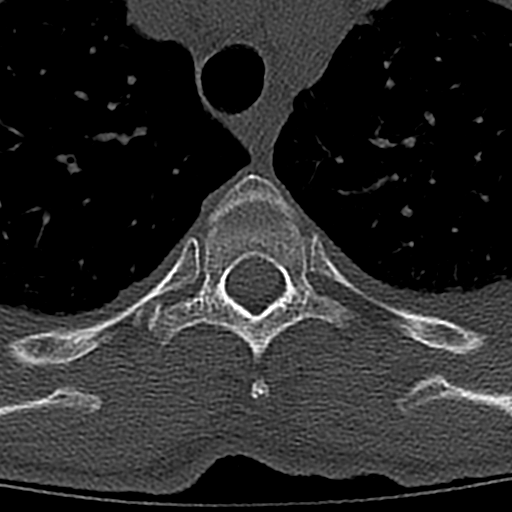
[im 150/163  bone]
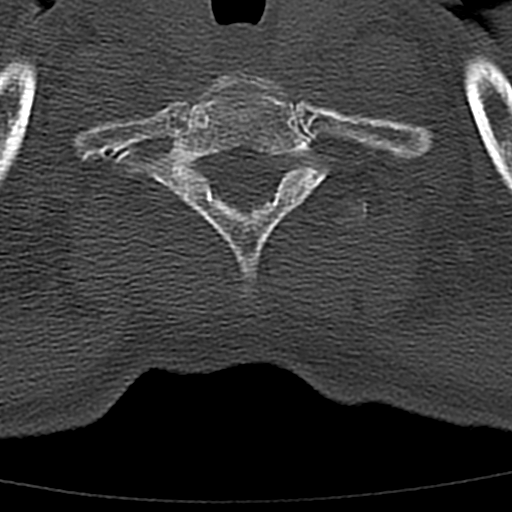

[11 of 33 positions shown; findings below may reference images not displayed]

FINDINGS: Alignment: Normal.

Vertebrae: No acute fracture or focal pathologic process.

Paraspinal and other soft tissues: Please see separate CT chest
report from same day.

Disc levels: Disc heights are relatively preserved. Unchanged
minimal degenerative disc disease in the lower thoracic spine. No
high-grade stenosis.
IMPRESSION: 1. No acute osseous abnormality.
2. Unchanged minimal degenerative disc disease in the lower thoracic
spine.

## 2022-01-03 MED ORDER — OXYCODONE-ACETAMINOPHEN 5-325 MG PO TABS: 1.0000 | ORAL_TABLET | Freq: Four times a day (QID) | ORAL | 0 refills | Status: DC | PRN

## 2022-01-03 MED ORDER — IBUPROFEN 400 MG PO TABS
600.0000 mg | ORAL_TABLET | Freq: Once | ORAL | Status: AC
  Administered 2022-01-03: 600 mg via ORAL
  Filled 2022-01-03: qty 1

## 2022-01-03 MED ORDER — OXYCODONE-ACETAMINOPHEN 5-325 MG PO TABS
1.0000 | ORAL_TABLET | Freq: Once | ORAL | Status: AC
  Administered 2022-01-03: 1 via ORAL
  Filled 2022-01-03: qty 1

## 2022-01-03 NOTE — ED Triage Notes (Addendum)
Patient states she had a trip and fall tuesday and is continuing to have sternal pain/rib pain. Patient states it is hard to take a deep breath.   0810: Patient given small bite of crackers per request before po meds. MD states ok.

## 2022-01-03 NOTE — ED Provider Notes (Signed)
[facility] Provider Note   CSN: [id number] Arrival date & time: 01/03/22  3016     History  Chief Complaint  Patient presents with   Chest Pain    Amber Cross is a 61 y.o. female presenting to emergency department with chest pain.  Patient reports she had a fall 2 days ago, which she describes as "I did a split" and reports that she struck her bottom on the ground, denies striking her chest, says she has been having pain near her right ribs and sternum since then.  It is worse with movement and inspiration.  She reports she has chronic back pain but seems to have worsening of her midline back pain.  She was recently diagnosed with pulmonary cancer, is not currently on chemo or radiation, but is slated to begin this may be in a week.  She is here with a family member at bedside.  HPI     Home Medications Prior to Admission medications   Medication Sig Start Date End Date Taking? Authorizing Provider  oxyCODONE-acetaminophen (PERCOCET/ROXICET) 5-325 MG tablet Take 1 tablet by mouth every 6 (six) hours as needed for up to 15 doses for severe pain. 01/03/22  Yes Jaleal Schliep, Carola Rhine, MD  acetaminophen (TYLENOL) 500 MG tablet Take 1,000 mg by mouth every 6 (six) hours as needed for mild pain.    [provider]  albuterol (VENTOLIN HFA) 108 (90 Base) MCG/ACT inhaler Inhale 2 puffs into the lungs every 6 (six) hours as needed for wheezing or shortness of breath. 03/26/21   [provider]  amLODipine (NORVASC) 5 MG tablet Take 1 tablet (5 mg total) by mouth daily. 11/10/21   Dessa Phi, DO  aspirin EC 81 MG EC tablet Take 1 tablet (81 mg total) by mouth daily. Swallow whole. 11/24/21   Rosezetta Schlatter, MD  Bayer Microlet Lancets lancets Check blood sugar up to 3 times a day as instructed 04/06/19   Katherine Roan, MD  carvedilol (COREG) 25 MG tablet Take 50 mg by mouth 2 (two) times daily with a meal.    [provider]   Cholecalciferol (VITAMIN D) 50 MCG (2000 UT) tablet Take 2,000 Units by mouth daily.    [provider]  DULoxetine (CYMBALTA) 60 MG capsule Take 1 capsule (60 mg total) by mouth daily. 11/24/21   Rosezetta Schlatter, MD  furosemide (LASIX) 40 MG tablet Take 1.5 tablets (60 mg total) by mouth daily. 12/11/21 03/11/22  Iona Beard, MD  gabapentin (NEURONTIN) 300 MG capsule Take 1 capsule (300 mg total) by mouth daily. 11/23/21 12/23/21  Rosezetta Schlatter, MD  glucose blood test strip Check blood sugar up to 3 times a day as instructed 12/11/21   Iona Beard, MD  insulin isophane & regular human KwikPen (NOVOLIN 70/30 KWIKPEN) (70-30) 100 UNIT/ML KwikPen Inject 10 Units into the skin 2 (two) times daily before lunch and supper. 11/23/21 12/23/21  Rosezetta Schlatter, MD  Insulin Pen Needle (UNIFINE PENTIPS) 32G X 4 MM MISC Use in the morning and at bedtime 12/11/21 12/06/22  Iona Beard, MD  losartan (COZAAR) 100 MG tablet Take 100 mg by mouth daily. 11/13/21   [provider]  Multiple Vitamin (MULTI-VITAMIN) tablet Take 1 tablet by mouth daily.    [provider]  nitroGLYCERIN (NITROSTAT) 0.4 MG SL tablet Place 0.4 mg under the tongue every 5 (five) minutes as needed for chest pain. 10/01/21   [provider]  pantoprazole (PROTONIX) 40 MG tablet Take  1 tablet (40 mg total) by mouth daily. 12/11/21 01/10/22  Iona Beard, MD  rosuvastatin (CRESTOR) 40 MG tablet Take 1 tablet (40 mg total) by mouth daily. 11/24/21   Rosezetta Schlatter, MD  sucralfate (CARAFATE) 1 g tablet Take 1 tablet (1 g total) by mouth 4 (four) times daily -  with meals and at bedtime. Patient taking differently: Take 1 g by mouth 2 (two) times daily. 11/23/21   Rosezetta Schlatter, MD      Allergies    Penicillins, Strawberry (diagnostic), and Tomato    Review of Systems   Review of Systems  Physical Exam Updated Vital Signs BP (!) 203/84   Pulse 70   Temp 97.8 F (36.6 C) (Oral)   Resp 17   Ht 5\' 7"   (1.702 m)   Wt 62.6 kg   LMP 09/29/2011   SpO2 95%   BMI 21.62 kg/m  Physical Exam Constitutional:      General: She is in acute distress.  HENT:     Head: Normocephalic and atraumatic.  Eyes:     Conjunctiva/sclera: Conjunctivae normal.     Pupils: Pupils are equal, round, and reactive to light.  Cardiovascular:     Rate and Rhythm: Normal rate and regular rhythm.  Pulmonary:     Effort: Pulmonary effort is normal. No respiratory distress.  Chest:     Comments: Lower sternal and right lower chest wall tenderness Mid thoracic spinal tenderness Abdominal:     General: There is no distension.     Tenderness: There is no abdominal tenderness.  Skin:    General: Skin is warm and dry.  Neurological:     General: No focal deficit present.     Mental Status: She is alert. Mental status is at baseline.  Psychiatric:        Mood and Affect: Mood normal.        Behavior: Behavior normal.     ED Results / Procedures / Treatments   Labs (all labs ordered are listed, but only abnormal results are displayed) Labs Reviewed - No data to display  EKG EKG Interpretation  Date/Time:  Thursday January 03 2022 07:53:58 EDT Ventricular Rate:  76 PR Interval:  144 QRS Duration: 89 QT Interval:  431 QTC Calculation: 485 R Axis:   23 Text Interpretation: Sinus rhythm Probable anterior infarct, old Confirmed by Octaviano Glow [phone]) on 01/03/2022 8:41:13 AM  Radiology CT T-SPINE NO CHARGE  Result Date: 01/03/2022 CLINICAL DATA:  Sternal and rib pain after fall on Tuesday. History of lung cancer. EXAM: CT THORACIC SPINE WITHOUT CONTRAST TECHNIQUE: Multidetector CT images of the thoracic were obtained using the standard protocol without intravenous contrast. RADIATION DOSE REDUCTION: This exam was performed according to the departmental dose-optimization program which includes automated exposure control, adjustment of the mA and/or kV according to patient size and/or use of iterative  reconstruction technique. COMPARISON:  MRI thoracic spine dated November 23, 2021. FINDINGS: Alignment: Normal. Vertebrae: No acute fracture or focal pathologic process. Paraspinal and other soft tissues: Please see separate CT chest report from same day. Disc levels: Disc heights are relatively preserved. Unchanged minimal degenerative disc disease in the lower thoracic spine. No high-grade stenosis. IMPRESSION: 1. No acute osseous abnormality. 2. Unchanged minimal degenerative disc disease in the lower thoracic spine. Electronically Signed   By: Titus Dubin M.D.   On: 01/03/2022 09:13   CT Chest Wo Contrast  Result Date: 01/03/2022 CLINICAL DATA:  Right-sided rib pain after fall on Tuesday. Evaluate  for fracture. EXAM: CT CHEST WITHOUT CONTRAST TECHNIQUE: Multidetector CT imaging of the chest was performed following the standard protocol without IV contrast. RADIATION DOSE REDUCTION: This exam was performed according to the departmental dose-optimization program which includes automated exposure control, adjustment of the mA and/or kV according to patient size and/or use of iterative reconstruction technique. COMPARISON:  Chest x-ray dated Dec 18, 2021. PET-CT dated Dec 12, 2021. FINDINGS: Cardiovascular: Unchanged mild cardiomegaly. Increased small pericardial effusion. No thoracic aortic aneurysm. Coronary, aortic arch, and branch vessel atherosclerotic vascular disease. Mediastinum/Nodes: No enlarged mediastinal or axillary lymph nodes. Unchanged heterogeneously enlarged thyroid gland. This has been evaluated on previous imaging and demonstrated no abnormal uptake on recent PET imaging. Trachea and esophagus demonstrate no significant findings. Lungs/Pleura: Increased small bilateral pleural effusions. Unchanged 11 x 10 mm slightly spiculated nodule in the right lung apex (series 4, image 31). Mild centrilobular emphysema. Left greater than right basilar subsegmental atelectasis. No consolidation or  pneumothorax. Upper Abdomen: No acute abnormality. Musculoskeletal: No acute or significant osseous findings. Anasarca. IMPRESSION: 1. No acute rib fracture. 2. Unchanged 11 x 10 mm slightly spiculated nodule in the right lung apex, biopsy-proven small cell lung cancer. 3. Increased small pericardial and bilateral pleural effusions. 4. Aortic Atherosclerosis (ICD10-I70.0) and Emphysema (ICD10-J43.9). Electronically Signed   By: Titus Dubin M.D.   On: 01/03/2022 09:05    Procedures Procedures    Medications Ordered in ED Medications  oxyCODONE-acetaminophen (PERCOCET/ROXICET) 5-325 MG per tablet 1 tablet (1 tablet Oral Given 01/03/22 0820)  ibuprofen (ADVIL) tablet 600 mg (600 mg Oral Given 01/03/22 0820)    ED Course/ Medical Decision Making/ A&P Clinical Course as of 01/03/22 1027  Thu Jan 03, 2022  0942 Patient's pain is dramatically improved with the Percocet, she is breathing more comfortably.  She already has an incentive spirometer at home.  I do not see an acute fracture, but in principal I think using the spirometer would still be beneficial to prevent pneumonia, given how much chest wall pain she is having. [MT]    Clinical Course User Index [MT] Jaskiran Pata, Carola Rhine, MD                           Medical Decision Making Amount and/or Complexity of Data Reviewed Radiology: ordered.  Risk Prescription drug management.   Patient is here with a mechanical fall at home, complaining of chest pain and back pain.  Differential diagnosis would include rib fracture versus sternal fracture versus thoracic compression fracture versus other  No evidence of massive pneumothorax on initial exam.  She was ordered Percocet and Motrin for pain.  She does appear to be in some level of distress, laying on her side.  Given the high likelihood of rib fractures, and likelihood of poor visualization on x-ray, I felt a CT scan would be the best option.  This would help [place] better visualize both rib  fractures or compression fracture, of the thoracic spine, which may be more consistent with the vertical impact she described from her fall.  She may be high risk for fractures given her age, likely osteoporosis, and underlying malignancy.          Final Clinical Impression(s) / ED Diagnoses Final diagnoses:  Chest wall pain    Rx / DC Orders ED Discharge Orders          Ordered    oxyCODONE-acetaminophen (PERCOCET/ROXICET) 5-325 MG tablet  Every 6 hours PRN  01/03/22 [place], Shaylah Mcghie J, MD 01/03/22 1027

## 2022-01-03 NOTE — Discharge Instructions (Addendum)
For the next 10 days, I want you to use your incentive spirometer every 1-2 hours while awake.  He should take 10 slow breaths at a time.  This is to prevent you from getting pneumonia.  Please schedule follow-up appointment with your doctors office for your chest pain.  We do not see any sign of a broken bone or fracture of your chest or spine on your CT scans.

## 2022-01-08 ENCOUNTER — Other Ambulatory Visit: Payer: Self-pay

## 2022-01-08 ENCOUNTER — Ambulatory Visit
Admission: RE | Admit: 2022-01-08 | Discharge: 2022-01-08 | Disposition: A | Discharge status: Home / Self Care | Payer: 59 | Source: Ambulatory Visit | Attending: Radiation Oncology | Admitting: Radiation Oncology

## 2022-01-08 DIAGNOSIS — Z510 Encounter for antineoplastic radiation therapy: Secondary | ICD-10-CM | POA: Diagnosis present

## 2022-01-08 DIAGNOSIS — C3411 Malignant neoplasm of upper lobe, right bronchus or lung: Secondary | ICD-10-CM | POA: Diagnosis not present

## 2022-01-08 NOTE — Progress Notes (Signed)
  Radiation Oncology         [phone]) [phone] ________________________________  Name: Amber Cross MRN: [id number]  Date: 01/08/2022  DOB: 12-13-1960  STEREOTACTIC BODY RADIOTHERAPY SIMULATION AND TREATMENT PLANNING NOTE    ICD-10-CM   1. Small cell lung cancer, right upper lobe (HCC)  C34.11       DIAGNOSIS:  61 yo woman with Stage IA limited stage small cell lung cancer in the Right Upper Lobe of the lung.  NARRATIVE:  The patient was brought to the [facility].  Identity was confirmed.  All relevant records and images related to the planned course of therapy were reviewed.  The patient freely provided informed written consent to proceed with treatment after reviewing the details related to the planned course of therapy. The consent form was witnessed and verified by the simulation staff.  Then, the patient was set-up in a stable reproducible  supine position for radiation therapy.  A BodyFix immobilization pillow was fabricated for reproducible positioning.  Then I personally applied the abdominal compression paddle to limit respiratory excursion.  4D respiratoy motion management CT images were obtained.  Surface markings were placed.  The CT images were loaded into the planning software.  Then, using Cine, MIP, and standard views, the internal target volume (ITV) and planning target volumes (PTV) were delinieated, and avoidance structures were contoured.  Treatment planning then occurred.  The radiation prescription was entered and confirmed.  A total of two complex treatment devices were fabricated in the form of the BodyFix immobilization pillow and a neck accuform cushion.  I have requested : 3D Simulation  I have requested a DVH of the following structures: Heart, Lungs, Esophagus, Chest Wall, Brachial Plexus, Major Blood Vessels, and targets.  SPECIAL TREATMENT PROCEDURE:  The planned course of therapy using radiation constitutes a special treatment procedure. Special care  is required in the management of this patient for the following reasons. This treatment constitutes a Special Treatment Procedure for the following reason: [ High dose per fraction requiring special monitoring for increased toxicities of treatment including daily imaging..  The special nature of the planned course of radiotherapy will require increased physician supervision and oversight to ensure patient's safety with optimal treatment outcomes.  This requires extended time and effort.    RESPIRATORY MOTION MANAGEMENT SIMULATION:  In order to account for effect of respiratory motion on target structures and other organs in the planning and delivery of radiotherapy, this patient underwent respiratory motion management simulation.  To accomplish this, when the patient was brought to the CT simulation planning suite, 4D respiratoy motion management CT images were obtained.  The CT images were loaded into the planning software.  Then, using a variety of tools including Cine, MIP, and standard views, the target volume and planning target volumes (PTV) were delineated.  Avoidance structures were contoured.  Treatment planning then occurred.  Dose volume histograms were generated and reviewed for each of the requested structure.  The resulting plan was carefully reviewed and approved today.  PLAN:  The patient will receive 54 Gy in 3 fractions.  ________________________________  Sheral Apley Tammi Klippel, M.D.

## 2022-01-10 ENCOUNTER — Ambulatory Visit: Payer: 59 | Admitting: Cardiology

## 2022-01-10 DIAGNOSIS — Z510 Encounter for antineoplastic radiation therapy: Secondary | ICD-10-CM | POA: Diagnosis not present

## 2022-01-16 ENCOUNTER — Encounter: Payer: Self-pay | Admitting: Cardiology

## 2022-01-22 ENCOUNTER — Ambulatory Visit
Admission: RE | Admit: 2022-01-22 | Discharge: 2022-01-22 | Disposition: A | Discharge status: Home / Self Care | Payer: 59 | Source: Ambulatory Visit | Attending: Radiation Oncology | Admitting: Radiation Oncology

## 2022-01-22 ENCOUNTER — Other Ambulatory Visit: Payer: Self-pay

## 2022-01-22 DIAGNOSIS — Z510 Encounter for antineoplastic radiation therapy: Secondary | ICD-10-CM | POA: Diagnosis not present

## 2022-01-22 DIAGNOSIS — C3411 Malignant neoplasm of upper lobe, right bronchus or lung: Secondary | ICD-10-CM

## 2022-01-22 LAB — RAD ONC ARIA SESSION SUMMARY
Course Elapsed Days: 0
Plan Fractions Treated to Date: 1
Plan Prescribed Dose Per Fraction: 18 Gy
Plan Total Fractions Prescribed: 3
Plan Total Prescribed Dose: 54 Gy
Reference Point Dosage Given to Date: 18 Gy
Reference Point Session Dosage Given: 18 Gy
Session Number: 1

## 2022-01-24 ENCOUNTER — Ambulatory Visit
Admission: RE | Admit: 2022-01-24 | Discharge: 2022-01-24 | Disposition: A | Discharge status: Home / Self Care | Payer: 59 | Source: Ambulatory Visit | Attending: Radiation Oncology | Admitting: Radiation Oncology

## 2022-01-24 ENCOUNTER — Other Ambulatory Visit: Payer: Self-pay

## 2022-01-24 DIAGNOSIS — C3411 Malignant neoplasm of upper lobe, right bronchus or lung: Secondary | ICD-10-CM

## 2022-01-24 DIAGNOSIS — Z510 Encounter for antineoplastic radiation therapy: Secondary | ICD-10-CM | POA: Diagnosis not present

## 2022-01-24 LAB — RAD ONC ARIA SESSION SUMMARY
Course Elapsed Days: 2
Plan Fractions Treated to Date: 2
Plan Prescribed Dose Per Fraction: 18 Gy
Plan Total Fractions Prescribed: 3
Plan Total Prescribed Dose: 54 Gy
Reference Point Dosage Given to Date: 36 Gy
Reference Point Session Dosage Given: 18 Gy
Session Number: 2

## 2022-01-27 ENCOUNTER — Encounter (HOSPITAL_BASED_OUTPATIENT_CLINIC_OR_DEPARTMENT_OTHER): Payer: Self-pay | Admitting: Emergency Medicine

## 2022-01-27 ENCOUNTER — Inpatient Hospital Stay (HOSPITAL_BASED_OUTPATIENT_CLINIC_OR_DEPARTMENT_OTHER)
Admission: EM | Admit: 2022-01-27 | Discharge: 2022-02-03 | DRG: 683 | Disposition: A | Discharge status: Home Health | Payer: 59 | Attending: Internal Medicine | Admitting: Internal Medicine

## 2022-01-27 ENCOUNTER — Other Ambulatory Visit: Payer: Self-pay

## 2022-01-27 ENCOUNTER — Emergency Department (HOSPITAL_BASED_OUTPATIENT_CLINIC_OR_DEPARTMENT_OTHER): Payer: 59

## 2022-01-27 DIAGNOSIS — Z794 Long term (current) use of insulin: Secondary | ICD-10-CM

## 2022-01-27 DIAGNOSIS — R8271 Bacteriuria: Secondary | ICD-10-CM | POA: Diagnosis present

## 2022-01-27 DIAGNOSIS — M25462 Effusion, left knee: Principal | ICD-10-CM

## 2022-01-27 DIAGNOSIS — E876 Hypokalemia: Secondary | ICD-10-CM | POA: Diagnosis not present

## 2022-01-27 DIAGNOSIS — E1165 Type 2 diabetes mellitus with hyperglycemia: Secondary | ICD-10-CM | POA: Diagnosis present

## 2022-01-27 DIAGNOSIS — M1120 Other chondrocalcinosis, unspecified site: Secondary | ICD-10-CM

## 2022-01-27 DIAGNOSIS — D509 Iron deficiency anemia, unspecified: Secondary | ICD-10-CM | POA: Diagnosis present

## 2022-01-27 DIAGNOSIS — M1712 Unilateral primary osteoarthritis, left knee: Secondary | ICD-10-CM | POA: Diagnosis present

## 2022-01-27 DIAGNOSIS — R809 Proteinuria, unspecified: Secondary | ICD-10-CM | POA: Diagnosis present

## 2022-01-27 DIAGNOSIS — I69398 Other sequelae of cerebral infarction: Secondary | ICD-10-CM

## 2022-01-27 DIAGNOSIS — R29898 Other symptoms and signs involving the musculoskeletal system: Secondary | ICD-10-CM | POA: Diagnosis present

## 2022-01-27 DIAGNOSIS — Z79899 Other long term (current) drug therapy: Secondary | ICD-10-CM

## 2022-01-27 DIAGNOSIS — N281 Cyst of kidney, acquired: Secondary | ICD-10-CM | POA: Diagnosis present

## 2022-01-27 DIAGNOSIS — K529 Noninfective gastroenteritis and colitis, unspecified: Secondary | ICD-10-CM | POA: Diagnosis present

## 2022-01-27 DIAGNOSIS — I10 Essential (primary) hypertension: Secondary | ICD-10-CM | POA: Diagnosis present

## 2022-01-27 DIAGNOSIS — Z833 Family history of diabetes mellitus: Secondary | ICD-10-CM

## 2022-01-27 DIAGNOSIS — R202 Paresthesia of skin: Secondary | ICD-10-CM | POA: Diagnosis present

## 2022-01-27 DIAGNOSIS — M11262 Other chondrocalcinosis, left knee: Secondary | ICD-10-CM | POA: Diagnosis present

## 2022-01-27 DIAGNOSIS — C3411 Malignant neoplasm of upper lobe, right bronchus or lung: Secondary | ICD-10-CM | POA: Diagnosis present

## 2022-01-27 DIAGNOSIS — E1122 Type 2 diabetes mellitus with diabetic chronic kidney disease: Secondary | ICD-10-CM | POA: Diagnosis present

## 2022-01-27 DIAGNOSIS — Z720 Tobacco use: Secondary | ICD-10-CM | POA: Diagnosis present

## 2022-01-27 DIAGNOSIS — D631 Anemia in chronic kidney disease: Secondary | ICD-10-CM | POA: Diagnosis present

## 2022-01-27 DIAGNOSIS — I5022 Chronic systolic (congestive) heart failure: Secondary | ICD-10-CM | POA: Diagnosis present

## 2022-01-27 DIAGNOSIS — T380X5A Adverse effect of glucocorticoids and synthetic analogues, initial encounter: Secondary | ICD-10-CM | POA: Diagnosis present

## 2022-01-27 DIAGNOSIS — N179 Acute kidney failure, unspecified: Principal | ICD-10-CM | POA: Diagnosis present

## 2022-01-27 DIAGNOSIS — Z880 Allergy status to penicillin: Secondary | ICD-10-CM

## 2022-01-27 DIAGNOSIS — G4700 Insomnia, unspecified: Secondary | ICD-10-CM | POA: Diagnosis present

## 2022-01-27 DIAGNOSIS — I130 Hypertensive heart and chronic kidney disease with heart failure and stage 1 through stage 4 chronic kidney disease, or unspecified chronic kidney disease: Secondary | ICD-10-CM | POA: Diagnosis present

## 2022-01-27 DIAGNOSIS — F17210 Nicotine dependence, cigarettes, uncomplicated: Secondary | ICD-10-CM | POA: Diagnosis present

## 2022-01-27 DIAGNOSIS — R339 Retention of urine, unspecified: Secondary | ICD-10-CM | POA: Diagnosis present

## 2022-01-27 DIAGNOSIS — B9620 Unspecified Escherichia coli [E. coli] as the cause of diseases classified elsewhere: Secondary | ICD-10-CM | POA: Diagnosis present

## 2022-01-27 DIAGNOSIS — N1832 Chronic kidney disease, stage 3b: Secondary | ICD-10-CM | POA: Diagnosis present

## 2022-01-27 DIAGNOSIS — R200 Anesthesia of skin: Secondary | ICD-10-CM | POA: Diagnosis present

## 2022-01-27 DIAGNOSIS — F419 Anxiety disorder, unspecified: Secondary | ICD-10-CM | POA: Diagnosis present

## 2022-01-27 DIAGNOSIS — Z91018 Allergy to other foods: Secondary | ICD-10-CM

## 2022-01-27 DIAGNOSIS — Z8249 Family history of ischemic heart disease and other diseases of the circulatory system: Secondary | ICD-10-CM

## 2022-01-27 DIAGNOSIS — E1142 Type 2 diabetes mellitus with diabetic polyneuropathy: Secondary | ICD-10-CM | POA: Diagnosis present

## 2022-01-27 HISTORY — DX: Other chondrocalcinosis, unspecified site: M11.20

## 2022-01-27 LAB — SYNOVIAL CELL COUNT + DIFF, W/ CRYSTALS
Eosinophils-Synovial: 0 % (ref 0–1)
Lymphocytes-Synovial Fld: 3 % (ref 0–20)
Monocyte-Macrophage-Synovial Fluid: 3 % — ABNORMAL LOW (ref 50–90)
Neutrophil, Synovial: 94 % — ABNORMAL HIGH (ref 0–25)
WBC, Synovial: 6300 /mm3 — ABNORMAL HIGH (ref 0–200)

## 2022-01-27 LAB — DIFFERENTIAL
Abs Immature Granulocytes: 0.06 10*3/uL (ref 0.00–0.07)
Basophils Absolute: 0 10*3/uL (ref 0.0–0.1)
Basophils Relative: 0 %
Eosinophils Absolute: 0 10*3/uL (ref 0.0–0.5)
Eosinophils Relative: 0 %
Immature Granulocytes: 1 %
Lymphocytes Relative: 10 %
Lymphs Abs: 1.1 10*3/uL (ref 0.7–4.0)
Monocytes Absolute: 0.7 10*3/uL (ref 0.1–1.0)
Monocytes Relative: 6 %
Neutro Abs: 9.5 10*3/uL — ABNORMAL HIGH (ref 1.7–7.7)
Neutrophils Relative %: 83 %

## 2022-01-27 LAB — COMPREHENSIVE METABOLIC PANEL
ALT: 11 U/L (ref 0–44)
AST: 16 U/L (ref 15–41)
Albumin: 1.8 g/dL — ABNORMAL LOW (ref 3.5–5.0)
Alkaline Phosphatase: 108 U/L (ref 38–126)
Anion gap: 8 (ref 5–15)
BUN: 31 mg/dL — ABNORMAL HIGH (ref 6–20)
CO2: 25 mmol/L (ref 22–32)
Calcium: 7.6 mg/dL — ABNORMAL LOW (ref 8.9–10.3)
Chloride: 107 mmol/L (ref 98–111)
Creatinine, Ser: 2.58 mg/dL — ABNORMAL HIGH (ref 0.44–1.00)
GFR, Estimated: 21 mL/min — ABNORMAL LOW (ref >60)
Glucose, Bld: 251 mg/dL — ABNORMAL HIGH (ref 70–99)
Potassium: 2.2 mmol/L — CL (ref 3.5–5.1)
Sodium: 140 mmol/L (ref 135–145)
Total Bilirubin: 0.4 mg/dL (ref 0.3–1.2)
Total Protein: 5.4 g/dL — ABNORMAL LOW (ref 6.5–8.1)

## 2022-01-27 LAB — CBC
HCT: 25 % — ABNORMAL LOW (ref 36.0–46.0)
Hemoglobin: 8.2 g/dL — ABNORMAL LOW (ref 12.0–15.0)
MCH: 28.5 pg (ref 26.0–34.0)
MCHC: 32.8 g/dL (ref 30.0–36.0)
MCV: 86.8 fL (ref 80.0–100.0)
Platelets: 375 10*3/uL (ref 150–400)
RBC: 2.88 MIL/uL — ABNORMAL LOW (ref 3.87–5.11)
RDW: 14.7 % (ref 11.5–15.5)
WBC: 11.4 10*3/uL — ABNORMAL HIGH (ref 4.0–10.5)
nRBC: 0 % (ref 0.0–0.2)

## 2022-01-27 LAB — MAGNESIUM: Magnesium: 1.6 mg/dL — ABNORMAL LOW (ref 1.7–2.4)

## 2022-01-27 LAB — CBG MONITORING, ED: Glucose-Capillary: 265 mg/dL — ABNORMAL HIGH (ref 70–99)

## 2022-01-27 MED ORDER — LIDOCAINE-EPINEPHRINE (PF) 2 %-1:200000 IJ SOLN
10.0000 mL | Freq: Once | INTRAMUSCULAR | Status: AC
  Administered 2022-01-27: 10 mL
  Filled 2022-01-27: qty 20

## 2022-01-27 MED ORDER — POTASSIUM CHLORIDE CRYS ER 20 MEQ PO TBCR
40.0000 meq | EXTENDED_RELEASE_TABLET | Freq: Once | ORAL | Status: AC
  Administered 2022-01-27: 40 meq via ORAL
  Filled 2022-01-27: qty 2

## 2022-01-27 MED ORDER — CARVEDILOL 12.5 MG PO TABS
50.0000 mg | ORAL_TABLET | Freq: Two times a day (BID) | ORAL | Status: DC
  Administered 2022-01-27: 50 mg via ORAL
  Filled 2022-01-27: qty 4

## 2022-01-27 MED ORDER — POTASSIUM CHLORIDE 10 MEQ/100ML IV SOLN
10.0000 meq | Freq: Once | INTRAVENOUS | Status: AC
  Administered 2022-01-27: 10 meq via INTRAVENOUS
  Filled 2022-01-27: qty 100

## 2022-01-27 MED ORDER — AMLODIPINE BESYLATE 5 MG PO TABS
5.0000 mg | ORAL_TABLET | ORAL | Status: DC
  Filled 2022-01-27: qty 1

## 2022-01-27 MED ORDER — HYDROMORPHONE HCL 1 MG/ML IJ SOLN
1.0000 mg | Freq: Once | INTRAMUSCULAR | Status: AC
  Administered 2022-01-27: 1 mg via INTRAVENOUS
  Filled 2022-01-27: qty 1

## 2022-01-27 MED ORDER — MAGNESIUM SULFATE 2 GM/50ML IV SOLN
2.0000 g | Freq: Once | INTRAVENOUS | Status: AC
  Administered 2022-01-27: 2 g via INTRAVENOUS
  Filled 2022-01-27: qty 50

## 2022-01-27 NOTE — ED Triage Notes (Signed)
Pt arrives pov, to triage in wheelchair, c/o LLE pain and weakness x 2 days. AOx4, Speech clear. Endorses diarrhea today, generalized body aches

## 2022-01-27 NOTE — ED Notes (Addendum)
Placed on [facility] for sat of 86% after Dilaudid. Sats improved to 94%

## 2022-01-27 NOTE — Progress Notes (Signed)
Plan of Care Note for accepted transfer   Patient: Amber Cross MRN: [id number]   DOA: 01/27/2022  Facility requesting transfer: [facility] ED Requesting Provider: Dr. Billy Fischer  Reason for transfer: left sided numbness and weakness with left facial numbness Facility course:   61 year old female with past medical history of diabetes mellitus type 2, iron deficiency anemia, hypertension, hyperlipidemia, nicotine dependence and recent diagnosis of small cell lung cancer of the left lung  (Dx 10/2021) who presents to [facility] emergency department with multiple complaints including a 2-day history of left-sided numbness and weakness, left facial numbness, left knee discomfort and swelling.  Upon evaluation in the emergency department patient was also found to be complaining of low back pain as well as a several month history of fecal incontinence.   Of note, patient underwent MRI work-up 4/28 including MRI of the brain cervical and thoracic spine at the time.   Noncontrast CT imaging of the head was obtained in the ED on this presentation and was found to be unremarkable.  Case was discussed with Dr. Cheral Marker with neurology who recommended hospitalization for repeat MRI work-up including MRI brain as well as MRI of the lumbar spine.  He recommended that if any of these studies are abnormal then to formally consult neurology after arrival to [facility].  Concerning patient's left knee swelling and pain and arthrocentesis was performed in the emergency department and successfully aspirated 100 cc of fluid.  Initial body fluid analysis revealed 6300 white blood cells with calcium pyrophosphate crystals suggestive of pseudogout.  Patient has been accepted for telemetry bed to [facility] where patient will undergo MRI work-up considering her neurologic complaints as well as likely needing a neurology consultation.  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    Plan of care: The patient is accepted for admission to Telemetry unit, at [facility]..  Patient is an internal medicine teaching service patient, their service should be notified upon arrival   Author: Vernelle Emerald, MD 01/27/2022  Check [email or web address] for on-call coverage.  Nursing staff, Please call [place] number on Amion as soon as patient's arrival, so appropriate admitting provider can evaluate the pt.

## 2022-01-27 NOTE — ED Provider Notes (Signed)
[name] Provider Note   CSN: [id number] Arrival date & time: 01/27/22  1519     History  Chief Complaint  Patient presents with   Leg Pain   Extremity Weakness    Amber Cross is a 61 y.o. female with history of type 2 diabetes, hypertension, hyperlipidemia, congestive heart failure, CKD stage IIIb and small cell lung carcinoma with her final radiation scheduled for tomorrow as well as history of strokes with residual left-sided deficits to include numbness and tingling presents to the emergency department for evaluation of left lower extremity pain and weakness x2 days.  Patient denies known trauma or injury and reports that the left knee has swollen up.  She also feels weakness throughout the entire left lower extremity and feels that she cannot ambulate.  She typically walks with a cane but has needed to use her wheelchair.  She also reports almost daily fecal incontinence going back until April.  She states that she told her doctor about her fecal incontinence who recommended that she come to the emergency department, however she did not want to wait that long to the emergency department, so she has not gotten it evaluated.  Patient was admitted in end of April 2023 for evaluation of severe hypertension tension complicated by acute punctate infarct.  Patient had MRI of the brain, C-spine and T-spine done at that time without any spinal cord deformities, fractures or subluxations.  No new strokes noted at that time.  Currently, she denies vision changes, chest pain, shortness of breath, abdominal pain, nausea, vomiting, diarrhea.  Of note, patient's last echocardiogram was 11/08/2021 with ejection fraction 55 to 60% with no wall motion abnormality.  She follows with Dr. Arlis Porta with oncology and Dr. Tammi Klippel with radiation oncology.  Her pulmonologist is Dr. Valeta Harms.   Leg Pain Extremity Weakness       Home Medications Prior to Admission medications    Medication Sig Start Date End Date Taking? Authorizing Provider  acetaminophen (TYLENOL) 500 MG tablet Take 1,000 mg by mouth every 6 (six) hours as needed for mild pain.    [provider]  albuterol (VENTOLIN HFA) 108 (90 Base) MCG/ACT inhaler Inhale 2 puffs into the lungs every 6 (six) hours as needed for wheezing or shortness of breath. 03/26/21   [provider]  amLODipine (NORVASC) 5 MG tablet Take 1 tablet (5 mg total) by mouth daily. 11/10/21   Dessa Phi, DO  aspirin EC 81 MG EC tablet Take 1 tablet (81 mg total) by mouth daily. Swallow whole. 11/24/21   Rosezetta Schlatter, MD  Bayer Microlet Lancets lancets Check blood sugar up to 3 times a day as instructed 04/06/19   Katherine Roan, MD  carvedilol (COREG) 25 MG tablet Take 50 mg by mouth 2 (two) times daily with a meal.    [provider]  Cholecalciferol (VITAMIN D) 50 MCG (2000 UT) tablet Take 2,000 Units by mouth daily.    [provider]  DULoxetine (CYMBALTA) 60 MG capsule Take 1 capsule (60 mg total) by mouth daily. 11/24/21   Rosezetta Schlatter, MD  furosemide (LASIX) 40 MG tablet Take 1.5 tablets (60 mg total) by mouth daily. 12/11/21 03/11/22  Iona Beard, MD  gabapentin (NEURONTIN) 300 MG capsule Take 1 capsule (300 mg total) by mouth daily. 11/23/21 12/23/21  Rosezetta Schlatter, MD  glucose blood test strip Check blood sugar up to 3 times a day as instructed 12/11/21   Iona Beard, MD  insulin isophane &  regular human KwikPen (NOVOLIN 70/30 KWIKPEN) (70-30) 100 UNIT/ML KwikPen Inject 10 Units into the skin 2 (two) times daily before lunch and supper. 11/23/21 12/23/21  Rosezetta Schlatter, MD  Insulin Pen Needle (UNIFINE PENTIPS) 32G X 4 MM MISC Use in the morning and at bedtime 12/11/21 12/06/22  Iona Beard, MD  losartan (COZAAR) 100 MG tablet Take 100 mg by mouth daily. 11/13/21   [provider]  Multiple Vitamin (MULTI-VITAMIN) tablet Take 1 tablet by mouth daily.    [provider]  nitroGLYCERIN (NITROSTAT) 0.4 MG SL tablet Place 0.4 mg under the tongue every 5 (five) minutes as needed for chest pain. 10/01/21   [provider]  oxyCODONE-acetaminophen (PERCOCET/ROXICET) 5-325 MG tablet Take 1 tablet by mouth every 6 (six) hours as needed for up to 15 doses for severe pain. 01/03/22   Wyvonnia Dusky, MD  pantoprazole (PROTONIX) 40 MG tablet Take 1 tablet (40 mg total) by mouth daily. 12/11/21 01/10/22  Iona Beard, MD  rosuvastatin (CRESTOR) 40 MG tablet Take 1 tablet (40 mg total) by mouth daily. 11/24/21   Rosezetta Schlatter, MD  sucralfate (CARAFATE) 1 g tablet Take 1 tablet (1 g total) by mouth 4 (four) times daily -  with meals and at bedtime. Patient taking differently: Take 1 g by mouth 2 (two) times daily. 11/23/21   Rosezetta Schlatter, MD      Allergies    Penicillins, Strawberry (diagnostic), and Tomato    Review of Systems   Review of Systems  Musculoskeletal:  Positive for extremity weakness.    Physical Exam Updated Vital Signs BP (!) 198/82   Pulse 78   Temp 98.5 F (36.9 C) (Oral)   Resp 14   Ht 5' 7.5" (1.715 m)   Wt 53.5 kg   LMP 09/29/2011   SpO2 98%   BMI 18.21 kg/m  Physical Exam Vitals and nursing note reviewed.  Constitutional:      General: She is not in acute distress.    Appearance: She is not ill-appearing.  HENT:     Head: Atraumatic.  Eyes:     Conjunctiva/sclera: Conjunctivae normal.  Cardiovascular:     Rate and Rhythm: Normal rate and regular rhythm.     Pulses: Normal pulses.          Radial pulses are 2+ on the right side and 2+ on the left side.       Dorsalis pedis pulses are 2+ on the right side and 2+ on the left side.     Heart sounds: No murmur heard. Pulmonary:     Effort: Pulmonary effort is normal. No respiratory distress.     Breath sounds: Normal breath sounds.  Abdominal:     General: Abdomen is flat. There is no distension.     Palpations: Abdomen is soft.     Tenderness: There is no abdominal  tenderness.  Musculoskeletal:        General: Normal range of motion.     Cervical back: Normal range of motion.     Comments: Large effusion noted to the left knee with significant pain on passive range of motion.  Skin:    General: Skin is warm and dry.     Capillary Refill: Capillary refill takes less than 2 seconds.  Neurological:     Mental Status: She is alert.     Comments: Weakness to the left lower extremity noted on straight leg raise, dorsiflexion and plantarflexion.  Subjective sensation diminished on the left  lower extremity, left upper extremity and the left side of her face compared to the right.    No pronator drift.   Psychiatric:        Mood and Affect: Mood normal.        ED Results / Procedures / Treatments   Labs (all labs ordered are listed, but only abnormal results are displayed) Labs Reviewed  CBC - Abnormal; Notable for the following components:      Result Value   WBC 11.4 (*)    RBC 2.88 (*)    Hemoglobin 8.2 (*)    HCT 25.0 (*)    All other components within normal limits  DIFFERENTIAL - Abnormal; Notable for the following components:   Neutro Abs 9.5 (*)    All other components within normal limits  COMPREHENSIVE METABOLIC PANEL - Abnormal; Notable for the following components:   Potassium 2.2 (*)    Glucose, Bld 251 (*)    BUN 31 (*)    Creatinine, Ser 2.58 (*)    Calcium 7.6 (*)    Total Protein 5.4 (*)    Albumin 1.8 (*)    GFR, Estimated 21 (*)    All other components within normal limits  CBG MONITORING, ED - Abnormal; Notable for the following components:   Glucose-Capillary 265 (*)    All other components within normal limits  BODY FLUID CULTURE W GRAM STAIN  GLUCOSE, BODY FLUID OTHER            PROTEIN, BODY FLUID (OTHER)  SYNOVIAL CELL COUNT + DIFF, W/ CRYSTALS  MAGNESIUM    EKG None  Radiology DG Knee Complete 4 Views Left  Result Date: 01/27/2022 CLINICAL DATA:  left leg swelling EXAM: LEFT KNEE - COMPLETE 4+ VIEW  COMPARISON:  None Available. FINDINGS: Osteopenia. No acute fracture or dislocation. Chondrocalcinosis. No area of erosion or osseous destruction. No unexpected radiopaque foreign body. Vascular calcifications. Large joint effusion. IMPRESSION: 1. Large joint effusion in the background of chondrocalcinosis. 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 Electronically Signed   By: Valentino Saxon M.D.   On: 01/27/2022 18:24   CT HEAD WO CONTRAST  Result Date: 01/27/2022 CLINICAL DATA:  Stroke, follow up EXAM: CT HEAD WITHOUT CONTRAST TECHNIQUE: Contiguous axial images were obtained from the base of the skull through the vertex without intravenous contrast. RADIATION DOSE REDUCTION: This exam was performed according to the departmental dose-optimization program which includes automated exposure control, adjustment of the mA and/or kV according to patient size and/or use of iterative reconstruction technique. COMPARISON:  CT dated November 21, 2021 FINDINGS: Brain: No evidence of acute infarction, hemorrhage, hydrocephalus, extra-axial collection or mass lesion/mass effect. Mild periventricular white matter hypodensities consistent with sequela of chronic microvascular ischemic disease. Previously described punctate infarction of the white matter is not visualized by CT. Vascular: No hyperdense vessel or unexpected calcification. Skull: Normal. Negative for fracture or focal lesion. Sinuses/Orbits: No acute finding. Other: None. IMPRESSION: No acute intracranial abnormality. Electronically Signed   By: Valentino Saxon M.D.   On: 01/27/2022 16:10    Procedures .Joint Aspiration/Arthrocentesis  Date/Time: 01/27/2022 7:05 PM  Performed by: Tonye Pearson, PA-C Authorized by: Tonye Pearson, PA-C   Consent:    Consent obtained:  Verbal   Consent given by:  Patient   Risks, benefits, and alternatives were discussed: yes     Risks discussed:  Bleeding,  infection, pain and incomplete drainage   Alternatives discussed:  No treatment Universal protocol:  Imaging studies available: yes     Patient identity confirmed:  Verbally with patient Location:    Location:  Knee   Knee:  L knee Anesthesia:    Anesthesia method:  Local infiltration Procedure details:    Preparation: Patient was prepped and draped in usual sterile fashion     Needle gauge:  18 G   Ultrasound guidance: no     Approach:  Medial   Aspirate amount:  114ml   Aspirate characteristics:  Yellow and cloudy   Steroid injected: no     Specimen collected: no   Post-procedure details:    Dressing:  Gauze roll   Procedure completion:  Tolerated well, no immediate complications     Medications Ordered in ED Medications  potassium chloride 10 mEq in 100 mL IVPB (10 mEq Intravenous New Bag/Given 01/27/22 1808)  potassium chloride SA (KLOR-CON M) CR tablet 40 mEq (40 mEq Oral Given 01/27/22 1808)  lidocaine-EPINEPHrine (XYLOCAINE W/EPI) 2 %-1:200000 (PF) injection 10 mL (10 mLs Infiltration Given 01/27/22 1839)    ED Course/ Medical Decision Making/ A&P                           Medical Decision Making Amount and/or Complexity of Data Reviewed Labs: ordered. Radiology: ordered.  Risk Prescription drug management.   Social determinants of health:  Social History   Socioeconomic History   Marital status: Married    Spouse name: Not on file   Number of children: Not on file   Years of education: Not on file   Highest education level: Not on file  Occupational History   Not on file  Tobacco Use   Smoking status: Every Day    Packs/day: 0.50    Years: 40.00    Total pack years: 20.00    Types: Cigarettes   Smokeless tobacco: Never  Vaping Use   Vaping Use: Never used  Substance and Sexual Activity   Alcohol use: Not Currently   Drug use: Yes    Frequency: 4.0 times per week    Types: Marijuana    Comment: smokes every other day   Sexual activity: Not  Currently    Birth control/protection: Abstinence  Other Topics Concern   Not on file  Social History Narrative   Not on file   Social Determinants of Health   Financial Resource Strain: Not on file  Food Insecurity: Not on file  Transportation Needs: Not on file  Physical Activity: Not on file  Stress: Not on file  Social Connections: Not on file  Intimate Partner Violence: Not on file     Initial impression:  This patient presents to the ED for concern of numerous complaints including LLE weakness and left knee swelling, this involves an extensive number of treatment options, and is a complaint that carries with it a high risk of complications and morbidity.   Differentials include cauda equina, stroke, septic joint, gouty arthritis, pseudogout, cellulitis.   Comorbidities affecting care:  Numerous, per HPI  Additional history obtained: Recent admission records, previous labs, MRI from April 2023  Lab Tests  I Ordered, reviewed, and interpreted labs and EKG.  The pertinent results include:  Potassium 2.2, creatinine 2.58, steadily increasing from 2.0 14-month ago and 1.78 before that Leukocytosis 11.4  Imaging Studies ordered:  I ordered imaging studies including  CT head without acute findings Left knee x-ray with large joint effusion in the background of chondrocalcinosis, possibly reflecting calcium pyrophosphate  dihydrate crystal deposition disease I independently visualized and interpreted imaging and I agree with the radiologist interpretation.    Cardiac Monitoring:  The patient was maintained on a cardiac monitor.  I personally viewed and interpreted the cardiac monitored which showed an underlying rhythm of: Sinus rhythm   Medicines ordered and prescription drug management:  I ordered medication including: Potassium 40 mEq p.o. 4 runs of potassium IV Reevaluation of the patient after these medicines showed that the patient stayed the same I have  reviewed the patients home medicines and have made adjustments as needed    Consultations Obtained:  I requested consultation with Dr. Cheral Marker in neurology who spoke with my attending, Dr. Billy Fischer.,  and discussed lab and imaging findings as well as pertinent plan - they recommend: Have patient admitted to medicine for hypokalemia and obtain MRI brain and lumbar spine and they will evaluate after these results   ED Course/Re-evaluation: Presents to the ED in no acute distress, nontoxic-appearing.  Vitals are without significant abnormality, she is satting well on room air.  On exam, there is a large effusion of the left knee joint and significant pain with passive range of motion.  She also has left lower extremity weakness and has trouble with straight leg raise against gravity.  Weakness noted on dorsiflexion and plantarflexion when compared to the right.  Subjective sensation of the left lower extremity, left upper extremity and face is diminished although patient notes that she has residual left-sided numbness and tingling from her previous stroke.  No facial droop noted.  No pronator drift.  Labs were concerning for mild leukocytosis of 11.4, however she her potassium was 2.2 so she was given potassium 40 mEq p.o. along with 4 rounds of potassium IV.  I performed joint aspiration of the left knee and removed 100 mL of yellow, cloudy fluid.  Patient was feeling slightly improved after removal of the fluid.  This was sent out for synovial evaluation. Her CT scan was without acute findings, however given the left lower extremity weakness along with her reported fecal incontinence, we consulted neurology.  Dr. Cheral Marker spoke with my attending, Dr. Billy Fischer so please see her note for further details however he ultimately recommends patient be admitted and MRI of the brain and lumbar spine obtained.  Patient care handed over to Dr. Billy Fischer who will contact hospitalist for admission and  transfer.   Final Clinical Impression(s) / ED Diagnoses Final diagnoses:  Effusion, left knee  Left leg weakness    Rx / DC Orders ED Discharge Orders     None         Rodena Piety 01/27/22 1914    Gareth Morgan, MD 01/28/22 [phone]

## 2022-01-28 ENCOUNTER — Ambulatory Visit: Payer: 59 | Admitting: Radiation Oncology

## 2022-01-28 ENCOUNTER — Encounter (HOSPITAL_BASED_OUTPATIENT_CLINIC_OR_DEPARTMENT_OTHER): Payer: Self-pay | Admitting: Internal Medicine

## 2022-01-28 ENCOUNTER — Observation Stay (HOSPITAL_COMMUNITY): Payer: 59

## 2022-01-28 DIAGNOSIS — I5022 Chronic systolic (congestive) heart failure: Secondary | ICD-10-CM | POA: Diagnosis not present

## 2022-01-28 DIAGNOSIS — M1120 Other chondrocalcinosis, unspecified site: Secondary | ICD-10-CM

## 2022-01-28 DIAGNOSIS — I10 Essential (primary) hypertension: Secondary | ICD-10-CM

## 2022-01-28 DIAGNOSIS — K529 Noninfective gastroenteritis and colitis, unspecified: Secondary | ICD-10-CM | POA: Diagnosis not present

## 2022-01-28 DIAGNOSIS — E876 Hypokalemia: Secondary | ICD-10-CM | POA: Diagnosis present

## 2022-01-28 DIAGNOSIS — M11262 Other chondrocalcinosis, left knee: Secondary | ICD-10-CM | POA: Diagnosis not present

## 2022-01-28 DIAGNOSIS — T380X5A Adverse effect of glucocorticoids and synthetic analogues, initial encounter: Secondary | ICD-10-CM | POA: Diagnosis not present

## 2022-01-28 DIAGNOSIS — Z8673 Personal history of transient ischemic attack (TIA), and cerebral infarction without residual deficits: Secondary | ICD-10-CM

## 2022-01-28 DIAGNOSIS — N179 Acute kidney failure, unspecified: Secondary | ICD-10-CM

## 2022-01-28 DIAGNOSIS — E1122 Type 2 diabetes mellitus with diabetic chronic kidney disease: Secondary | ICD-10-CM | POA: Diagnosis not present

## 2022-01-28 DIAGNOSIS — D631 Anemia in chronic kidney disease: Secondary | ICD-10-CM | POA: Diagnosis not present

## 2022-01-28 DIAGNOSIS — G4700 Insomnia, unspecified: Secondary | ICD-10-CM | POA: Diagnosis not present

## 2022-01-28 DIAGNOSIS — M1712 Unilateral primary osteoarthritis, left knee: Secondary | ICD-10-CM | POA: Diagnosis not present

## 2022-01-28 DIAGNOSIS — N281 Cyst of kidney, acquired: Secondary | ICD-10-CM | POA: Diagnosis not present

## 2022-01-28 DIAGNOSIS — D509 Iron deficiency anemia, unspecified: Secondary | ICD-10-CM | POA: Diagnosis not present

## 2022-01-28 DIAGNOSIS — N1832 Chronic kidney disease, stage 3b: Secondary | ICD-10-CM | POA: Diagnosis not present

## 2022-01-28 DIAGNOSIS — R8271 Bacteriuria: Secondary | ICD-10-CM | POA: Diagnosis not present

## 2022-01-28 DIAGNOSIS — B9620 Unspecified Escherichia coli [E. coli] as the cause of diseases classified elsewhere: Secondary | ICD-10-CM | POA: Diagnosis not present

## 2022-01-28 DIAGNOSIS — F17210 Nicotine dependence, cigarettes, uncomplicated: Secondary | ICD-10-CM | POA: Diagnosis not present

## 2022-01-28 DIAGNOSIS — F419 Anxiety disorder, unspecified: Secondary | ICD-10-CM | POA: Diagnosis not present

## 2022-01-28 DIAGNOSIS — I69398 Other sequelae of cerebral infarction: Secondary | ICD-10-CM | POA: Diagnosis not present

## 2022-01-28 DIAGNOSIS — E1142 Type 2 diabetes mellitus with diabetic polyneuropathy: Secondary | ICD-10-CM | POA: Diagnosis not present

## 2022-01-28 DIAGNOSIS — R29898 Other symptoms and signs involving the musculoskeletal system: Secondary | ICD-10-CM | POA: Diagnosis present

## 2022-01-28 DIAGNOSIS — Z794 Long term (current) use of insulin: Secondary | ICD-10-CM

## 2022-01-28 DIAGNOSIS — C3411 Malignant neoplasm of upper lobe, right bronchus or lung: Secondary | ICD-10-CM | POA: Diagnosis not present

## 2022-01-28 DIAGNOSIS — R202 Paresthesia of skin: Secondary | ICD-10-CM | POA: Diagnosis not present

## 2022-01-28 DIAGNOSIS — R339 Retention of urine, unspecified: Secondary | ICD-10-CM | POA: Diagnosis not present

## 2022-01-28 DIAGNOSIS — E1121 Type 2 diabetes mellitus with diabetic nephropathy: Secondary | ICD-10-CM | POA: Diagnosis not present

## 2022-01-28 DIAGNOSIS — E1165 Type 2 diabetes mellitus with hyperglycemia: Secondary | ICD-10-CM | POA: Diagnosis not present

## 2022-01-28 DIAGNOSIS — I130 Hypertensive heart and chronic kidney disease with heart failure and stage 1 through stage 4 chronic kidney disease, or unspecified chronic kidney disease: Secondary | ICD-10-CM | POA: Diagnosis not present

## 2022-01-28 LAB — BASIC METABOLIC PANEL
Anion gap: 7 (ref 5–15)
Anion gap: 10 (ref 5–15)
BUN: 31 mg/dL — ABNORMAL HIGH (ref 6–20)
BUN: 28 mg/dL — ABNORMAL HIGH (ref 6–20)
CO2: 25 mmol/L (ref 22–32)
CO2: 25 mmol/L (ref 22–32)
Calcium: 7.2 mg/dL — ABNORMAL LOW (ref 8.9–10.3)
Calcium: 7.5 mg/dL — ABNORMAL LOW (ref 8.9–10.3)
Chloride: 106 mmol/L (ref 98–111)
Chloride: 104 mmol/L (ref 98–111)
Creatinine, Ser: 2.57 mg/dL — ABNORMAL HIGH (ref 0.44–1.00)
Creatinine, Ser: 2.63 mg/dL — ABNORMAL HIGH (ref 0.44–1.00)
GFR, Estimated: 21 mL/min — ABNORMAL LOW (ref >60)
GFR, Estimated: 20 mL/min — ABNORMAL LOW (ref >60)
Glucose, Bld: 268 mg/dL — ABNORMAL HIGH (ref 70–99)
Glucose, Bld: 249 mg/dL — ABNORMAL HIGH (ref 70–99)
Potassium: 2.3 mmol/L — CL (ref 3.5–5.1)
Potassium: 2.7 mmol/L — CL (ref 3.5–5.1)
Sodium: 138 mmol/L (ref 135–145)
Sodium: 139 mmol/L (ref 135–145)

## 2022-01-28 LAB — URINALYSIS, ROUTINE W REFLEX MICROSCOPIC
Bilirubin Urine: NEGATIVE
Glucose, UA: >=500 mg/dL — AB
Ketones, ur: NEGATIVE mg/dL
Leukocytes,Ua: NEGATIVE
Nitrite: NEGATIVE
Protein, ur: >=300 mg/dL — AB
Specific Gravity, Urine: 1.025 (ref 1.005–1.030)
pH: 5 (ref 5.0–8.0)

## 2022-01-28 LAB — LIPID PANEL
Cholesterol: 226 mg/dL — ABNORMAL HIGH (ref 0–200)
HDL: 44 mg/dL (ref >40)
LDL Cholesterol: 143 mg/dL — ABNORMAL HIGH (ref 0–99)
Total CHOL/HDL Ratio: 5.1 RATIO
Triglycerides: 194 mg/dL — ABNORMAL HIGH (ref <150)
VLDL: 39 mg/dL (ref 0–40)

## 2022-01-28 LAB — HEMOGLOBIN A1C
Hgb A1c MFr Bld: 9.7 % — ABNORMAL HIGH (ref 4.8–5.6)
Mean Plasma Glucose: 231.69 mg/dL

## 2022-01-28 LAB — CBC WITH DIFFERENTIAL/PLATELET
Abs Immature Granulocytes: 0.03 10*3/uL (ref 0.00–0.07)
Basophils Absolute: 0 10*3/uL (ref 0.0–0.1)
Basophils Relative: 0 %
Eosinophils Absolute: 0 10*3/uL (ref 0.0–0.5)
Eosinophils Relative: 0 %
HCT: 21.6 % — ABNORMAL LOW (ref 36.0–46.0)
Hemoglobin: 7.1 g/dL — ABNORMAL LOW (ref 12.0–15.0)
Immature Granulocytes: 0 %
Lymphocytes Relative: 16 %
Lymphs Abs: 1.5 10*3/uL (ref 0.7–4.0)
MCH: 28.6 pg (ref 26.0–34.0)
MCHC: 32.9 g/dL (ref 30.0–36.0)
MCV: 87.1 fL (ref 80.0–100.0)
Monocytes Absolute: 0.8 10*3/uL (ref 0.1–1.0)
Monocytes Relative: 9 %
Neutro Abs: 6.8 10*3/uL (ref 1.7–7.7)
Neutrophils Relative %: 75 %
Platelets: 334 10*3/uL (ref 150–400)
RBC: 2.48 MIL/uL — ABNORMAL LOW (ref 3.87–5.11)
RDW: 14.8 % (ref 11.5–15.5)
WBC: 9.2 10*3/uL (ref 4.0–10.5)
nRBC: 0 % (ref 0.0–0.2)

## 2022-01-28 LAB — MAGNESIUM
Magnesium: 2.1 mg/dL (ref 1.7–2.4)
Magnesium: 2 mg/dL (ref 1.7–2.4)

## 2022-01-28 LAB — URINALYSIS, MICROSCOPIC (REFLEX): WBC, UA: >50 WBC/hpf (ref 0–5)

## 2022-01-28 LAB — NA AND K (SODIUM & POTASSIUM), RAND UR
Potassium Urine: 23 mmol/L
Sodium, Ur: 48 mmol/L

## 2022-01-28 LAB — GLUCOSE, CAPILLARY
Glucose-Capillary: 237 mg/dL — ABNORMAL HIGH (ref 70–99)
Glucose-Capillary: 206 mg/dL — ABNORMAL HIGH (ref 70–99)

## 2022-01-28 LAB — CREATININE, URINE, RANDOM: Creatinine, Urine: 78.32 mg/dL

## 2022-01-28 MED ORDER — INSULIN GLARGINE-YFGN 100 UNIT/ML ~~LOC~~ SOLN
10.0000 [IU] | Freq: Every day | SUBCUTANEOUS | Status: DC
  Administered 2022-01-28 – 2022-01-29 (×2): 10 [IU] via SUBCUTANEOUS
  Filled 2022-01-28 (×3): qty 0.1

## 2022-01-28 MED ORDER — HYDROMORPHONE HCL 1 MG/ML IJ SOLN
1.0000 mg | Freq: Once | INTRAMUSCULAR | Status: AC
  Administered 2022-01-28: 1 mg via INTRAVENOUS
  Filled 2022-01-28: qty 1

## 2022-01-28 MED ORDER — POTASSIUM CHLORIDE CRYS ER 20 MEQ PO TBCR
40.0000 meq | EXTENDED_RELEASE_TABLET | Freq: Three times a day (TID) | ORAL | Status: AC
Start: 2022-01-28 — End: 2022-01-28
  Administered 2022-01-28 (×2): 40 meq via ORAL
  Filled 2022-01-28 (×2): qty 2

## 2022-01-28 MED ORDER — ROSUVASTATIN CALCIUM 20 MG PO TABS
40.0000 mg | ORAL_TABLET | Freq: Every day | ORAL | Status: DC
  Administered 2022-01-28 – 2022-02-03 (×7): 40 mg via ORAL
  Filled 2022-01-28 (×8): qty 2

## 2022-01-28 MED ORDER — SENNOSIDES-DOCUSATE SODIUM 8.6-50 MG PO TABS: 1.0000 | ORAL_TABLET | Freq: Every evening | ORAL | Status: DC | PRN

## 2022-01-28 MED ORDER — GABAPENTIN 300 MG PO CAPS
300.0000 mg | ORAL_CAPSULE | Freq: Every day | ORAL | Status: DC
  Administered 2022-01-28: 300 mg via ORAL
  Filled 2022-01-28: qty 1

## 2022-01-28 MED ORDER — POTASSIUM CHLORIDE 10 MEQ/100ML IV SOLN
10.0000 meq | INTRAVENOUS | Status: AC
  Administered 2022-01-28 (×3): 10 meq via INTRAVENOUS
  Filled 2022-01-28 (×2): qty 100

## 2022-01-28 MED ORDER — POTASSIUM CHLORIDE 10 MEQ/100ML IV SOLN
10.0000 meq | Freq: Once | INTRAVENOUS | Status: AC
  Administered 2022-01-28: 10 meq via INTRAVENOUS
  Filled 2022-01-28: qty 100

## 2022-01-28 MED ORDER — HYDROMORPHONE HCL 1 MG/ML IJ SOLN
1.0000 mg | INTRAMUSCULAR | Status: DC | PRN
  Administered 2022-01-28 (×3): 1 mg via INTRAVENOUS
  Filled 2022-01-28 (×3): qty 1

## 2022-01-28 MED ORDER — AMLODIPINE BESYLATE 5 MG PO TABS
5.0000 mg | ORAL_TABLET | Freq: Every day | ORAL | Status: DC
  Administered 2022-01-28 – 2022-02-01 (×5): 5 mg via ORAL
  Filled 2022-01-28 (×5): qty 1

## 2022-01-28 MED ORDER — ACETAMINOPHEN 325 MG PO TABS
650.0000 mg | ORAL_TABLET | Freq: Four times a day (QID) | ORAL | Status: DC | PRN
  Administered 2022-01-29 – 2022-02-01 (×4): 650 mg via ORAL
  Filled 2022-01-28 (×4): qty 2

## 2022-01-28 MED ORDER — ASPIRIN 81 MG PO TBEC
81.0000 mg | DELAYED_RELEASE_TABLET | Freq: Every day | ORAL | Status: DC
  Administered 2022-01-28 – 2022-02-03 (×7): 81 mg via ORAL
  Filled 2022-01-28 (×7): qty 1

## 2022-01-28 MED ORDER — ENOXAPARIN SODIUM 30 MG/0.3ML IJ SOSY
30.0000 mg | PREFILLED_SYRINGE | INTRAMUSCULAR | Status: DC
  Administered 2022-01-28 – 2022-02-03 (×7): 30 mg via SUBCUTANEOUS
  Filled 2022-01-28 (×7): qty 0.3

## 2022-01-28 MED ORDER — INSULIN ASPART 100 UNIT/ML IJ SOLN
0.0000 [IU] | Freq: Three times a day (TID) | INTRAMUSCULAR | Status: DC
  Administered 2022-01-28: 5 [IU] via SUBCUTANEOUS
  Administered 2022-01-29: 2 [IU] via SUBCUTANEOUS
  Administered 2022-01-30: 5 [IU] via SUBCUTANEOUS
  Administered 2022-01-30: 11 [IU] via SUBCUTANEOUS
  Administered 2022-01-30 – 2022-01-31 (×2): 15 [IU] via SUBCUTANEOUS
  Administered 2022-01-31: 3 [IU] via SUBCUTANEOUS
  Administered 2022-02-01: 8 [IU] via SUBCUTANEOUS
  Administered 2022-02-01 – 2022-02-02 (×4): 3 [IU] via SUBCUTANEOUS
  Administered 2022-02-03: 5 [IU] via SUBCUTANEOUS

## 2022-01-28 MED ORDER — ALBUTEROL SULFATE HFA 108 (90 BASE) MCG/ACT IN AERS: 2.0000 | INHALATION_SPRAY | Freq: Four times a day (QID) | RESPIRATORY_TRACT | Status: DC | PRN

## 2022-01-28 MED ORDER — POTASSIUM CHLORIDE CRYS ER 20 MEQ PO TBCR: 40.0000 meq | EXTENDED_RELEASE_TABLET | Freq: Once | ORAL | Status: DC

## 2022-01-28 MED ORDER — CARVEDILOL 25 MG PO TABS
50.0000 mg | ORAL_TABLET | Freq: Two times a day (BID) | ORAL | Status: DC
  Administered 2022-01-28 – 2022-02-03 (×13): 50 mg via ORAL
  Filled 2022-01-28 (×14): qty 2

## 2022-01-28 MED ORDER — LACTATED RINGERS IV SOLN
INTRAVENOUS | Status: AC
Start: 2022-01-28 — End: 2022-01-29

## 2022-01-28 MED ORDER — PANTOPRAZOLE SODIUM 40 MG PO TBEC
40.0000 mg | DELAYED_RELEASE_TABLET | Freq: Every day | ORAL | Status: DC
  Administered 2022-01-28 – 2022-01-31 (×4): 40 mg via ORAL
  Filled 2022-01-28 (×4): qty 1

## 2022-01-28 MED ORDER — FUROSEMIDE 40 MG PO TABS
60.0000 mg | ORAL_TABLET | Freq: Every day | ORAL | Status: DC
  Administered 2022-01-28: 60 mg via ORAL
  Filled 2022-01-28: qty 1

## 2022-01-28 MED ORDER — POTASSIUM CHLORIDE 10 MEQ/100ML IV SOLN
10.0000 meq | INTRAVENOUS | Status: AC
  Administered 2022-01-28 (×2): 10 meq via INTRAVENOUS
  Filled 2022-01-28 (×3): qty 100

## 2022-01-28 MED ORDER — GADOBUTROL 1 MMOL/ML IV SOLN
6.0000 mL | Freq: Once | INTRAVENOUS | Status: AC | PRN
  Administered 2022-01-28: 6 mL via INTRAVENOUS

## 2022-01-28 MED ORDER — LOSARTAN POTASSIUM 50 MG PO TABS
100.0000 mg | ORAL_TABLET | Freq: Every day | ORAL | Status: DC
  Administered 2022-01-28: 100 mg via ORAL
  Filled 2022-01-28: qty 2

## 2022-01-28 MED ORDER — ACETAMINOPHEN 650 MG RE SUPP: 650.0000 mg | Freq: Four times a day (QID) | RECTAL | Status: DC | PRN

## 2022-01-28 MED ORDER — OXYCODONE HCL 5 MG PO TABS
5.0000 mg | ORAL_TABLET | ORAL | Status: DC | PRN
  Administered 2022-01-28 – 2022-02-03 (×21): 5 mg via ORAL
  Filled 2022-01-28 (×21): qty 1

## 2022-01-28 NOTE — ED Provider Notes (Signed)
5:38 AM Morning labs shows the patient's potassium is still 2.3.  We will start 4 runs of IV potassium and give a dose of oral K-Dur.     Pavneet Markwood, Jenny Reichmann, MD 01/28/22 [phone]

## 2022-01-28 NOTE — ED Notes (Signed)
Report completed to [employer]. Admitting provider came to see patient and is seeing patient before transport upstairs

## 2022-01-28 NOTE — H&P (Cosign Needed Addendum)
NAME:  Amber Cross, MRN:  [id number], DOB:  20-Feb-1961, LOS: 0 ADMISSION DATE:  01/27/2022, Primary: [name], DO  CHIEF COMPLAINT:  left knee pain   Medical Service: Internal Medicine Teaching Service         Attending Physician: Dr. Sid Falcon, MD    First Contact: Dr. Carin Primrose Pager: [phone]  Second Contact: Dr. Coy Saunas Pager: [phone]       After Hours (After 5p/  First Contact Pager: [phone]  weekends / holidays): Second Contact Pager: [place]   Amber Cross is 61yo person with recent CVA, type II diabetes mellitus, iron deficiency anemia, hypertension, hyperlipidemia, small cell lung cancer, tobacco use disorder presenting to [facility] after being transferred from [facility] for left knee pain and left leg weakness. Amber Cross states she was in her usual state of health until a few days ago when she started experiencing severe weakness of her left lower extremity. Mentions that previously she has been able to walk, even after her recent stroke. However, now she is no longer able to lift her left leg off of the bed. Also mentions ongoing numbness/tingling on the left side of her face, LUE, and LLE. In addition, she describes diarrhea/bowel incontinence that has been occurring since April. Describes having at least six bowel movements daily that occur without notice and soon after eating. She denies any associated abdominal pain. This has been unchanged. She denies fevers, chills, abdominal pain, nausea, vomiting, hematemesis, mucous in stool, recent travel, camping, dietary changes.   PCP: [name], DO  ED COURSE  Patient initially arrived to [facility] hypertensive but otherwise hemodynamically stable. Neurology was consulted, who recommended admission and imaging. IMTS was consulted for admission and patient was transferred to [facility] for further work-up.  PAST MEDICAL HISTORY  She,  has a past medical history of Anemia, Arthritis, CHF (congestive heart  failure) ([place]), Chronic kidney disease, Depression, Diabetes mellitus, Headache, Hypercholesteremia, Hypertension, Pseudogout, Stroke ([place]) (10/2021), and Tobacco abuse.   HOME MEDICATIONS   Prior to Admission medications   Medication Sig Start Date End Date Taking? Authorizing Provider  amLODipine (NORVASC) 5 MG tablet Take 1 tablet (5 mg total) by mouth daily. 11/10/21  Yes Dessa Phi, DO  carvedilol (COREG) 25 MG tablet Take 50 mg by mouth 2 (two) times daily with a meal.   Yes [provider]  gabapentin (NEURONTIN) 300 MG capsule Take 1 capsule (300 mg total) by mouth daily. Patient taking differently: Take 300 mg by mouth at bedtime. 11/23/21 01/28/22 Yes Rosezetta Schlatter, MD  acetaminophen (TYLENOL) 500 MG tablet Take 1,000 mg by mouth every 6 (six) hours as needed for mild pain.    [provider]  albuterol (VENTOLIN HFA) 108 (90 Base) MCG/ACT inhaler Inhale 2 puffs into the lungs every 6 (six) hours as needed for wheezing or shortness of breath. 03/26/21   [provider]  aspirin EC 81 MG EC tablet Take 1 tablet (81 mg total) by mouth daily. Swallow whole. 11/24/21   Rosezetta Schlatter, MD  Bayer Microlet Lancets lancets Check blood sugar up to 3 times a day as instructed 04/06/19   Katherine Roan, MD  Cholecalciferol (VITAMIN D) 50 MCG (2000 UT) tablet Take 2,000 Units by mouth daily.    [provider]  DULoxetine (CYMBALTA) 60 MG capsule Take 1 capsule (60 mg total) by mouth daily. 11/24/21   Rosezetta Schlatter, MD  furosemide (LASIX) 40 MG tablet Take 1.5 tablets (60 mg total)  by mouth daily. 12/11/21 03/11/22  Iona Beard, MD  glucose blood test strip Check blood sugar up to 3 times a day as instructed 12/11/21   Iona Beard, MD  insulin isophane & regular human KwikPen (NOVOLIN 70/30 KWIKPEN) (70-30) 100 UNIT/ML KwikPen Inject 10 Units into the skin 2 (two) times daily before lunch and supper. 11/23/21 12/23/21  Rosezetta Schlatter, MD  Insulin Pen Needle  (UNIFINE PENTIPS) 32G X 4 MM MISC Use in the morning and at bedtime 12/11/21 12/06/22  Iona Beard, MD  losartan (COZAAR) 100 MG tablet Take 100 mg by mouth daily. 11/13/21   [provider]  Multiple Vitamin (MULTI-VITAMIN) tablet Take 1 tablet by mouth daily.    [provider]  nitroGLYCERIN (NITROSTAT) 0.4 MG SL tablet Place 0.4 mg under the tongue every 5 (five) minutes as needed for chest pain. 10/01/21   [provider]  oxyCODONE-acetaminophen (PERCOCET/ROXICET) 5-325 MG tablet Take 1 tablet by mouth every 6 (six) hours as needed for up to 15 doses for severe pain. 01/03/22   Wyvonnia Dusky, MD  pantoprazole (PROTONIX) 40 MG tablet Take 1 tablet (40 mg total) by mouth daily. 12/11/21 01/10/22  Iona Beard, MD  rosuvastatin (CRESTOR) 40 MG tablet Take 1 tablet (40 mg total) by mouth daily. 11/24/21   Rosezetta Schlatter, MD  sucralfate (CARAFATE) 1 g tablet Take 1 tablet (1 g total) by mouth 4 (four) times daily -  with meals and at bedtime. Patient taking differently: Take 1 g by mouth 2 (two) times daily. 11/23/21   Rosezetta Schlatter, MD    ALLERGIES   Allergies as of 01/27/2022 - Review Complete 01/27/2022  Allergen Reaction Noted   Penicillins Itching 01/16/2016   Strawberry (diagnostic) Itching 05/07/2021   Tomato Itching 05/07/2021   SOCIAL HISTORY  Amber Cross lives in [place] and was able to complete her ADL's and IADL's independently prior to her most recent event. Current tobacco user, 1/2ppd since she was 14. Denies alcohol use. Intermittent marijuana use.   FAMILY HISTORY  Her family history includes Diabetes in her mother; Hypertension in her father and mother.   REVIEW OF SYSTEMS  ROS per history of present illness.  PHYSICAL EXAMINATION  Blood pressure (!) 190/74, pulse 72, temperature 97.9 F (36.6 C), temperature source Oral, resp. rate 14, height 5' 7.5" (1.715 m), weight 53.5 kg, last menstrual period 09/29/2011, SpO2 98 %.    Filed  Weights   01/27/22 1533  Weight: 53.5 kg   GENERAL: Resting comfortably in bed in no acute distress HENT: Normocephalic, atraumatic. Supple neck.  EYES: No scleral icterus or conjunctival injection. Vision grossly in tact. CV: Regular rate, rhythm. No murmurs appreciated. Warm extremities. No JVD. PULM: Normal work of breathing on room air. Clear to ausculation bilaterally. GI:  Soft, non-distended, mildly tender on RUQ, RLQ. Normoactive bowel sounds. No external hemorrhoids or bleeding appreciated on rectal exam.  MSK: Normal bulk, tone. No peripheral edema bilaterally. L knee mildly more swollen than R. No erythema or warmth appreciated. SKIN: warm, dry. No rashes or lesions appreciated. NEURO: Awake, alert, conversing appropriately. Decreased sensation on L face V1-V3. Otherwise cranial nerves in tact. Motor function 4/5 on LUE, 5/5 on RUE, 0/5 on LLE, 4/5 on RLE. Decreased sensation on LLE, LUE.  PSYCH: Normal mood, affect, speech.  SIGNIFICANT DIAGNOSTIC TESTS  ECG: Normal sinus rhythm. PVC present. T-wave inversions in V1, III.   I personally reviewed patient's ECG with my interpretation as above.  L Knee XR:  Large joint effusion in the background of chondrocalcinosis. 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  LABS      Latest Ref Rng & Units 01/28/2022    5:11 AM 01/27/2022    4:20 PM 12/19/2021    2:17 PM  CBC  WBC 4.0 - 10.5 K/uL 9.2  11.4  10.2   Hemoglobin 12.0 - 15.0 g/dL 7.1  8.2  8.0   Hematocrit 36.0 - 46.0 % 21.6  25.0  24.3   Platelets 150 - 400 K/uL 334  375  220       Latest Ref Rng & Units 01/28/2022    2:24 PM 01/28/2022    5:11 AM 01/27/2022    4:20 PM  BMP  Glucose 70 - 99 mg/dL 249  268  251   BUN 6 - 20 mg/dL 28  31  31    Creatinine 0.44 - 1.00 mg/dL 2.63  2.57  2.58   Sodium 135 - 145 mmol/L 139  138  140   Potassium 3.5 - 5.1 mmol/L 2.7  2.3  2.2   Chloride 98 - 111 mmol/L 104  106  107   CO2 22 - 32  mmol/L 25  25  25    Calcium 8.9 - 10.3 mg/dL 7.5  7.2  7.6     CONSULTS  N/a  ASSESSMENT  Amber Cross is 61yo person with recent CVA, type II diabetes mellitus, iron deficiency anemia, chronic systolic heart failure with improved ejection fraction, hypertension, hyperlipidemia, small cell lung cancer, tobacco use disorder admitted 7/3 with worsening neurological deficits without known etiology.   PLAN  Principal Problem:   Weakness of left lower extremity Active Problems:   Type 2 diabetes mellitus with peripheral neuropathy (HCC)   Essential hypertension   Tobacco use   Stage 3b chronic kidney disease (CKD) (HCC)   Left sided numbness   Calcium pyrophosphate deposition disease (CPPD)  #LLE weakness #Recent punctate infarct Amber Cross is presenting today with two day history of worsening left lower extremity weakness and persistent numbness/tingling of the left side of her body since her stroke. Symptoms are concerning for recrudescence vs new stroke vs other underlying neurological disease. No saddle anesthesia on exam. In addition, she is severely hypokalemic, which could be contributing. She is out of thrombotic and permissive hypertension window for a potential stroke. Discussed with neurology who recommended electrolyte replacement and imaging of brain and spine. After imaging has completed, can re-consult neurology. - Follow-up MRI brain - Follow-up MRI L-spine - Re-discuss with neurology following imaging - PT/OT  #AKI on CKDIIIb Patient arrived today with sCr 2.63 w/ GFR 20. Baseline renal function sCr ~1.7, GFR ~30. Patient has been eating and drinking normally, but she is having significant GI losses with minimum 6 liquidy bowel movements daily. No signs or symptoms of infection. Will encourage PO intake and give gently IVF fluids overnight. - LR 75/hr x12h - Daily BMP - Follow-up urine studies - Strict I/O - Avoid nephrotoxins  #Hypokalemia K 2.2 on arrival  today in setting of significant GI losses. 2.7 this afternoon after receiving some treatment. We will give more K this evening and continue to monitor. She does have history of hypokalemia dating back at least through last admission, will obtain UK/Cr to evaluate for urine potassium losses. - Klor-Con 80mEq x2 this evening - IV potassium x4 - Repeat BMP in AM - Follow-up urine studies  #Type II diabetes mellitus A1c 9.7% today. Sugars have remained elevated in  200's. Patient is on NPH at home, will schedule long and short acting while here in the hospital. Likely will need to adjust/add mealtime coverage if she has adequate PO intake.  - Semglee 10u QHS - SSI  #Chronic diarrhea #Bowel incontinence This is a chronic issue for Amber Cross. However, I am concerned that the diarrhea could be precipitating some of these other issues. I am unsure when exactly these symptoms started, sounds like a few months ago. Will send off labs for further evaluation for any underlying disease. - Follow-up TSH, fecal calprotectin, inflammatory markers  #Large knee effusion s/p arthrocentesis 7/3 #CPPD Arthrocentesis completed at [facility] ED, which revealed intracellular calcium pyrophosphate crystals. We will see if we can perform an intra-articular glucocorticoid injection while inpatient. Otherwise, will hold off any NSAID's given her current renal function. This appears to be her first attack, therefore will not qualify for prophylaxis at this juncture. Today, the knee looks much improved although still swollen.   #Hypertension Patient's home medications were restarted by ED this morning. She continues to be significantly hypertensive. I do not see where secondary causes have been worked-up. Will check TSH, hold off aldo/renin ratio given she is on ARB currently.  - Follow-up TSH - Hold home losartan in setting of AKI  #Chronic systolic heart failure with improved ejection fraction Previously had EF 25%, most  recent 10/2021 55-60%. On exam, she appears euvolemic. No signs or symptoms of acute heart failure. Will hold ARB and diuretic due to aKI. - Hold home losartan, furosemide due to AKI - Carvedilol 50mg  twice daily  #Hyperlipidemia Chronic, stable issue. Continue home statin.  BEST PRACTICE  DIET: HH/CM IVF: n/a DVT PPX: lovenox BOWEL: senokot-s CODE: FULL FAM COM: Discussed with patient's mother over the phone during interview  DISPO: Admit patient to Observation with expected length of stay less than 2 midnights.  Sanjuan Dame, MD Internal Medicine Resident PGY-3 PAGER: [phone] 01/28/2022 3:27 PM  If after hours (below), please contact on-call pager: [phone] 5PM-7AM Monday-Friday 1PM-7AM Saturday-Sunday

## 2022-01-28 NOTE — Progress Notes (Incomplete)
Clinical update: Paged by RN for L knee swelling and pain. Patient report increased swelling of the left knee since drainage in ED. Reports difficulty moving the knee. Denies swelling in other joints. Reports oxycodone earlier helped with pain  Physical exam: General: Patient sleeping comfortably in bed, responds to voice MSK: L knee effusion without erythema, no warmth, painful with ROM  A/P Re-accumulation of L knee effusion from CPPD. Avoid colchicine and NSAIDs given renal function. Report pain improved with oxy earlier today. Will giver her

## 2022-01-28 NOTE — ED Notes (Signed)
Pt arrived to ED2 as direct admit with assigned room on 5W. This nurse called 5W to give report and take pt to room - charge nurse has full asignment and needs to check that pt is appropriate before accepting patient.

## 2022-01-28 NOTE — ED Notes (Signed)
Phone Handoff Report given to [facility] Transport Team 

## 2022-01-28 NOTE — Inpatient Diabetes Management (Signed)
Inpatient Diabetes Program Recommendations  AACE/ADA: New Consensus Statement on Inpatient Glycemic Control (2015)  Target Ranges:  Prepandial:   less than 140 mg/dL      Peak postprandial:   less than 180 mg/dL (1-2 hours)      Critically ill patients:  140 - 180 mg/dL   Lab Results  Component Value Date   GLUCAP 265 (H) 01/27/2022   HGBA1C 9.7 (H) 01/28/2022    Review of Glycemic Control  Latest Reference Range & Units 01/27/22 16:21  Glucose-Capillary 70 - 99 mg/dL 265 (H)   Diabetes history: DM 2 Outpatient Diabetes medications:  Novolin 70/30 10 units bid Current orders for Inpatient glycemic control:  None noted  Inpatient Diabetes Program Recommendations:    While in the hospital, consider adding Semglee 8 units daily and Novolog very sensitive correction tid with meals and HS.    Thanks,  Adah Perl, RN, BC-ADM Inpatient Diabetes Coordinator Pager [phone]  (8a-5p)

## 2022-01-28 NOTE — ED Notes (Signed)
Phone call made to Amber Cross at [facility] ED in regards to this client being tx via [facility] for admission

## 2022-01-28 NOTE — ED Notes (Signed)
[facility] at bedside 

## 2022-01-28 NOTE — Progress Notes (Signed)
Clinical update: Paged by RN for L knee swelling and pain. Patient report increased swelling of the left knee since drainage in ED. Reports difficulty moving the knee. Denies swelling in other joints. Reports oxycodone earlier helped with pain  Physical exam: General: Afebrile, patient sleeping comfortably in bed, responds to voice MSK: L knee effusion without erythema, no warmth, painful with ROM  A/P Small L knee effusion likely re-accumulation from CPPD, no significant erythema or worsening of effusion. Avoiding colchicine and NSAIDs given renal function. Pain improved with oxycodone 5 mg earlier today. Will give next dose of oxycodone 5 mg early. Possible plan for steroid injection in AM will discuss with day team.

## 2022-01-28 NOTE — ED Notes (Signed)
Awake, rubbing left knee, she feels like it is swelling up again. Pain med administration reviewed, informed client of when next doses of meds can be administered.

## 2022-01-28 NOTE — ED Notes (Signed)
Pt arrived via EMS as direct admit from [facility] for L knee joint effusion in which pt states fluid was drained at previous ED. Received 1mg  dilaudid prior to transport and 4mg  zofran during transport  both IV per EMS. L knee swollen upon assessment. Pt states pain is under control but is still nauseous despite medication, does not want more nausea medicine but wants food. Explained we cannot currently give food d/t NPO diet and will need to be seen by hospitalist.

## 2022-01-28 NOTE — Progress Notes (Signed)
Pt reported 8/10 at 2100 and received PRN pain med.   Now pt reports L knee is swelling again (was drained today) but is now 10/10 pain.

## 2022-01-28 NOTE — Progress Notes (Signed)
  Transition of Care [facility]) Screening Note   Patient Details  Name: Amber Cross Date of Birth: [birth year]   Transition of Care [facility]) CM/SW Contact:    Cyndi Bender, RN Phone Number: 01/28/2022, 4:14 PM    Transition of Care [facility]) has reviewed patient and no TOC needs have been identified at this time. We will continue to monitor patient advancement through interdisciplinary progression rounds. If new patient transition needs arise, please place a TOC consult.

## 2022-01-29 ENCOUNTER — Observation Stay (HOSPITAL_COMMUNITY): Payer: 59

## 2022-01-29 DIAGNOSIS — R202 Paresthesia of skin: Secondary | ICD-10-CM | POA: Diagnosis present

## 2022-01-29 DIAGNOSIS — D509 Iron deficiency anemia, unspecified: Secondary | ICD-10-CM | POA: Diagnosis present

## 2022-01-29 DIAGNOSIS — E1142 Type 2 diabetes mellitus with diabetic polyneuropathy: Secondary | ICD-10-CM | POA: Diagnosis present

## 2022-01-29 DIAGNOSIS — N281 Cyst of kidney, acquired: Secondary | ICD-10-CM | POA: Diagnosis present

## 2022-01-29 DIAGNOSIS — F419 Anxiety disorder, unspecified: Secondary | ICD-10-CM | POA: Diagnosis present

## 2022-01-29 DIAGNOSIS — N1832 Chronic kidney disease, stage 3b: Secondary | ICD-10-CM | POA: Diagnosis present

## 2022-01-29 DIAGNOSIS — F17210 Nicotine dependence, cigarettes, uncomplicated: Secondary | ICD-10-CM | POA: Diagnosis present

## 2022-01-29 DIAGNOSIS — I5022 Chronic systolic (congestive) heart failure: Secondary | ICD-10-CM | POA: Diagnosis present

## 2022-01-29 DIAGNOSIS — I69398 Other sequelae of cerebral infarction: Secondary | ICD-10-CM | POA: Diagnosis not present

## 2022-01-29 DIAGNOSIS — R8271 Bacteriuria: Secondary | ICD-10-CM | POA: Diagnosis present

## 2022-01-29 DIAGNOSIS — T380X5A Adverse effect of glucocorticoids and synthetic analogues, initial encounter: Secondary | ICD-10-CM | POA: Diagnosis present

## 2022-01-29 DIAGNOSIS — I130 Hypertensive heart and chronic kidney disease with heart failure and stage 1 through stage 4 chronic kidney disease, or unspecified chronic kidney disease: Secondary | ICD-10-CM | POA: Diagnosis present

## 2022-01-29 DIAGNOSIS — N179 Acute kidney failure, unspecified: Secondary | ICD-10-CM | POA: Diagnosis present

## 2022-01-29 DIAGNOSIS — R339 Retention of urine, unspecified: Secondary | ICD-10-CM | POA: Diagnosis present

## 2022-01-29 DIAGNOSIS — G4700 Insomnia, unspecified: Secondary | ICD-10-CM | POA: Diagnosis present

## 2022-01-29 DIAGNOSIS — E876 Hypokalemia: Secondary | ICD-10-CM | POA: Diagnosis present

## 2022-01-29 DIAGNOSIS — M11262 Other chondrocalcinosis, left knee: Secondary | ICD-10-CM | POA: Diagnosis present

## 2022-01-29 DIAGNOSIS — C3411 Malignant neoplasm of upper lobe, right bronchus or lung: Secondary | ICD-10-CM | POA: Diagnosis present

## 2022-01-29 DIAGNOSIS — D631 Anemia in chronic kidney disease: Secondary | ICD-10-CM | POA: Diagnosis present

## 2022-01-29 DIAGNOSIS — K529 Noninfective gastroenteritis and colitis, unspecified: Secondary | ICD-10-CM | POA: Diagnosis present

## 2022-01-29 DIAGNOSIS — R29898 Other symptoms and signs involving the musculoskeletal system: Secondary | ICD-10-CM | POA: Diagnosis not present

## 2022-01-29 DIAGNOSIS — E1165 Type 2 diabetes mellitus with hyperglycemia: Secondary | ICD-10-CM | POA: Diagnosis present

## 2022-01-29 DIAGNOSIS — B9620 Unspecified Escherichia coli [E. coli] as the cause of diseases classified elsewhere: Secondary | ICD-10-CM | POA: Diagnosis present

## 2022-01-29 DIAGNOSIS — M1712 Unilateral primary osteoarthritis, left knee: Secondary | ICD-10-CM | POA: Diagnosis present

## 2022-01-29 DIAGNOSIS — E1122 Type 2 diabetes mellitus with diabetic chronic kidney disease: Secondary | ICD-10-CM | POA: Diagnosis present

## 2022-01-29 DIAGNOSIS — I160 Hypertensive urgency: Secondary | ICD-10-CM | POA: Diagnosis not present

## 2022-01-29 LAB — CBC
HCT: 21.1 % — ABNORMAL LOW (ref 36.0–46.0)
HCT: 25.4 % — ABNORMAL LOW (ref 36.0–46.0)
Hemoglobin: 6.8 g/dL — CL (ref 12.0–15.0)
Hemoglobin: 8.2 g/dL — ABNORMAL LOW (ref 12.0–15.0)
MCH: 28.6 pg (ref 26.0–34.0)
MCH: 28.3 pg (ref 26.0–34.0)
MCHC: 32.2 g/dL (ref 30.0–36.0)
MCHC: 32.3 g/dL (ref 30.0–36.0)
MCV: 88.7 fL (ref 80.0–100.0)
MCV: 87.6 fL (ref 80.0–100.0)
Platelets: 340 10*3/uL (ref 150–400)
Platelets: 357 10*3/uL (ref 150–400)
RBC: 2.38 MIL/uL — ABNORMAL LOW (ref 3.87–5.11)
RBC: 2.9 MIL/uL — ABNORMAL LOW (ref 3.87–5.11)
RDW: 15 % (ref 11.5–15.5)
RDW: 15.5 % (ref 11.5–15.5)
WBC: 8.9 10*3/uL (ref 4.0–10.5)
WBC: 9.4 10*3/uL (ref 4.0–10.5)
nRBC: 0 % (ref 0.0–0.2)
nRBC: 0 % (ref 0.0–0.2)

## 2022-01-29 LAB — BASIC METABOLIC PANEL
Anion gap: 9 (ref 5–15)
BUN: 29 mg/dL — ABNORMAL HIGH (ref 6–20)
CO2: 23 mmol/L (ref 22–32)
Calcium: 7.4 mg/dL — ABNORMAL LOW (ref 8.9–10.3)
Chloride: 104 mmol/L (ref 98–111)
Creatinine, Ser: 2.86 mg/dL — ABNORMAL HIGH (ref 0.44–1.00)
GFR, Estimated: 18 mL/min — ABNORMAL LOW (ref >60)
Glucose, Bld: 168 mg/dL — ABNORMAL HIGH (ref 70–99)
Potassium: 3.7 mmol/L (ref 3.5–5.1)
Sodium: 136 mmol/L (ref 135–145)

## 2022-01-29 LAB — C-REACTIVE PROTEIN: CRP: 7.3 mg/dL — ABNORMAL HIGH (ref <1.0)

## 2022-01-29 LAB — PREPARE RBC (CROSSMATCH)

## 2022-01-29 LAB — SEDIMENTATION RATE: Sed Rate: 110 mm/hr — ABNORMAL HIGH (ref 0–22)

## 2022-01-29 LAB — GLUCOSE, CAPILLARY
Glucose-Capillary: 122 mg/dL — ABNORMAL HIGH (ref 70–99)
Glucose-Capillary: 365 mg/dL — ABNORMAL HIGH (ref 70–99)
Glucose-Capillary: 374 mg/dL — ABNORMAL HIGH (ref 70–99)
Glucose-Capillary: 65 mg/dL — ABNORMAL LOW (ref 70–99)
Glucose-Capillary: 88 mg/dL (ref 70–99)
Glucose-Capillary: 93 mg/dL (ref 70–99)

## 2022-01-29 LAB — TSH: TSH: 1.694 u[IU]/mL (ref 0.350–4.500)

## 2022-01-29 MED ORDER — SODIUM CHLORIDE 0.9% IV SOLUTION: Freq: Once | INTRAVENOUS | Status: AC

## 2022-01-29 MED ORDER — PREDNISONE 20 MG PO TABS
20.0000 mg | ORAL_TABLET | Freq: Every day | ORAL | Status: DC
  Administered 2022-01-29 – 2022-02-01 (×4): 20 mg via ORAL
  Filled 2022-01-29 (×4): qty 1

## 2022-01-29 MED ORDER — LACTATED RINGERS IV BOLUS
500.0000 mL | Freq: Once | INTRAVENOUS | Status: AC
Start: 2022-01-29 — End: 2022-01-30
  Administered 2022-01-29: 500 mL via INTRAVENOUS

## 2022-01-29 NOTE — Progress Notes (Signed)
Lab called with critical Hgb of 6.8; Internal Med team notified.

## 2022-01-29 NOTE — Progress Notes (Signed)
Pt did not have dinner; given a late frozen dinner. Pt POC glucose 365 after meal. Gave long acting insulin and paged MD to find out if they would like any additional coverage.

## 2022-01-29 NOTE — Evaluation (Signed)
Occupational Therapy Evaluation Patient Details Name: Amber Cross MRN: [id number] DOB: Oct 03, 1960 Today's Date: 01/29/2022   History of Present Illness 61 y.o. female presents to [facility] hospital on 01/27/2022 with L knee pain and LLE weakness. Pt underwent L knee arthrocentesis on 7/3. MRI Lumbar spine negative, MRI brain pending. Pt found to be hypokalemic. PMHx of T2DM, HTN, HLD, anxiety, HFpEF, CKD IIIb.   Clinical Impression   Pt currently with increased left knee pain with limited knee flexion as well as weightbearing.  Mod assist for transfer to sitting EOB as well as for standing and transfer to the beside recliner with use of the RW.  Decreased ability to reach her feet for LB selfcare as well.  Will benefit from acute care OT to address current limitations in ADLs.  Recommend 24 hr supervision at discharge.  Pt declines inpatient or SNF level rehab and states she will get her son to come and stay with her at discharge.        Recommendations for follow up therapy are one component of a multi-disciplinary discharge planning process, led by the attending physician.  Recommendations may be updated based on patient status, additional functional criteria and insurance authorization.   Follow Up Recommendations  Home health OT    Assistance Recommended at Discharge Frequent or constant Supervision/Assistance  Patient can return home with the following A little help with walking and/or transfers;A little help with bathing/dressing/bathroom;Assistance with cooking/housework;Assist for transportation;Help with stairs or ramp for entrance    Functional Status Assessment  Patient has had a recent decline in their functional status and demonstrates the ability to make significant improvements in function in a reasonable and predictable amount of time.  Equipment Recommendations  BSC/3in1;Tub/shower bench       Precautions / Restrictions Precautions Precautions: Fall Restrictions Weight  Bearing Restrictions: No LLE Weight Bearing: Weight bearing as tolerated      Mobility Bed Mobility Overal bed mobility: Needs Assistance Bed Mobility: Supine to Sit   Sidelying to sit: Mod assist Supine to sit: Mod assist     General bed mobility comments: min assist to slide the LLE over to the edge with min assist to transition from sidelying to sitting    Transfers Overall transfer level: Needs assistance Equipment used: Rolling walker (2 wheels) Transfers: Sit to/from Stand, Bed to chair/wheelchair/BSC Sit to Stand: From elevated surface, Mod assist (mod assist from elevated EOB)     Step pivot transfers: Mod assist     General transfer comment: Decreased ability to maintain weightbearing over the LLE efficiently with stepping.      Balance Overall balance assessment: Needs assistance Sitting-balance support: No upper extremity supported, Feet supported Sitting balance-Leahy Scale: Poor Sitting balance - Comments: one posterior loss of balance requiring modA to correct when attempting to scoot forward   Standing balance support: Bilateral upper extremity supported, Reliant on assistive device for balance Standing balance-Leahy Scale: Poor Standing balance comment: Pt needed assist from the RW and therapist to stand and complete short distance mobility.                           ADL either performed or assessed with clinical judgement   ADL Overall ADL's : Needs assistance/impaired Eating/Feeding: Independent;Sitting   Grooming: Wash/dry hands;Wash/dry face;Set up;Sitting Grooming Details (indicate cue type and reason): simulated Upper Body Bathing: Supervision/ safety;Sitting Upper Body Bathing Details (indicate cue type and reason): simulated Lower Body Bathing: Moderate assistance;Sit to/from stand Lower  Body Bathing Details (indicate cue type and reason): simulated Upper Body Dressing : Supervision/safety;Sitting Upper Body Dressing Details  (indicate cue type and reason): simulated Lower Body Dressing: Sit to/from stand Lower Body Dressing Details (indicate cue type and reason): max assist Toilet Transfer: Moderate assistance;Rolling walker (2 wheels) Toilet Transfer Details (indicate cue type and reason): simulated to bedside recliner Toileting- Clothing Manipulation and Hygiene: Moderate assistance;Sit to/from stand       Functional mobility during ADLs: Moderate assistance;Rolling walker (2 wheels) General ADL Comments: Pt with increased left knee pain, unable to tolerate much weightbearing on ti currently with increased pain limiting knee flexion.  She lives alone but states she can get her son to come and be with her initially at home.  She does not want to go to rehab inpatient or SNF.     Vision Baseline Vision/History: 1 Wears glasses Ability to See in Adequate Light: 0 Adequate Patient Visual Report: No change from baseline Vision Assessment?: No apparent visual deficits Additional Comments: Will continue to assess in treatment     Perception Perception Perception: Within Functional Limits   Praxis Praxis Praxis: Intact    Pertinent Vitals/Pain Pain Assessment Pain Assessment: 0-10 Pain Score: 10-Worst pain ever Pain Location: L knee Pain Descriptors / Indicators: Discomfort, Grimacing Pain Intervention(s): Monitored during session, Repositioned     Hand Dominance Right   Extremity/Trunk Assessment Upper Extremity Assessment Upper Extremity Assessment: LUE deficits/detail LUE Deficits / Details: Strength overall 3+/5 throughout with AROM WFLs for all joints LUE Sensation: decreased light touch   Lower Extremity Assessment Lower Extremity Assessment: Defer to PT evaluation LLE Deficits / Details: ankle DF/PDF 4-/5, knee extension/flexion 3-/5, hip adduction 3-/5 LLE Sensation: decreased light touch   Cervical / Trunk Assessment Cervical / Trunk Assessment: Normal   Communication  Communication Communication: No difficulties   Cognition Arousal/Alertness: Awake/alert Behavior During Therapy: WFL for tasks assessed/performed Overall Cognitive Status: Within Functional Limits for tasks assessed                                       General Comments  VSS on RA, L knee redness and warmth noted            Home Living Family/patient expects to be discharged to:: Private residence Living Arrangements: Alone Available Help at Discharge: Family;Friend(s);Available PRN/intermittently (son) Type of Home: House Home Access: Stairs to enter [employer] of Steps: 3-4 Entrance Stairs-Rails: Can reach both Home Layout: One level     Bathroom Shower/Tub: Teacher, early years/pre: Standard Bathroom Accessibility: No   Home Equipment: Merchant navy officer (4 wheels)          Prior Functioning/Environment Prior Level of Function : Independent/Modified Independent;Driving             Mobility Comments: pt ambulating with use of rollator since CVA ADLs Comments: Could complete showers in standing.        OT Problem List: Decreased strength;Decreased knowledge of use of DME or AE;Decreased range of motion;Decreased activity tolerance;Impaired balance (sitting and/or standing);Pain      OT Treatment/Interventions: Self-care/ADL training;Therapeutic exercise;Balance training;Patient/family education;Therapeutic activities;DME and/or AE instruction    OT Goals(Current goals can be found in the care plan section) Acute Rehab OT Goals Patient Stated Goal: Pt wants to get back to being by herself. OT Goal Formulation: With patient Time For Goal Achievement: 02/12/22 Potential to Achieve Goals: Good  OT Frequency: Min 2X/week    Co-evaluation PT/OT/SLP Co-Evaluation/Treatment: Yes Reason for Co-Treatment: For patient/therapist safety;To address functional/ADL transfers PT goals addressed during session: Mobility/safety  with mobility;Balance;Proper use of DME;Strengthening/ROM OT goals addressed during session: ADL's and self-care      AM-PAC OT "6 Clicks" Daily Activity     Outcome Measure Help from another person eating meals?: None Help from another person taking care of personal grooming?: A Little Help from another person toileting, which includes using toliet, bedpan, or urinal?: A Lot Help from another person bathing (including washing, rinsing, drying)?: A Lot Help from another person to put on and taking off regular upper body clothing?: None Help from another person to put on and taking off regular lower body clothing?: A Lot 6 Click Score: 17   End of Session Equipment Utilized During Treatment: Gait belt;Rolling walker (2 wheels) Nurse Communication: Mobility status  Activity Tolerance: Patient limited by pain Patient left: in chair;with call bell/phone within reach  OT Visit Diagnosis: Unsteadiness on feet (R26.81);Muscle weakness (generalized) (M62.81);Pain Pain - Right/Left: Left Pain - part of body: Knee                Time: 1114-1200 OT Time Calculation (min): 46 min Charges:  OT General Charges $OT Visit: 1 Visit OT Evaluation $OT Eval Low Complexity: 1 Low OT Treatments $Self Care/Home Management : 8-22 mins Chieko Neises OTR/L 01/29/2022, 12:57 PM

## 2022-01-29 NOTE — Progress Notes (Addendum)
Subjective:   Summary: Amber Cross is a 61 y.o. year old female currently admitted on the IMTS HD#0 for left knee pain.  Overnight Events: Knee pain  Left knee pain and effusion worsened overnight.  Objective:  Vital signs in last 24 hours: Vitals:   01/29/22 0444 01/29/22 0642 01/29/22 0824 01/29/22 1208  BP: 136/87 (!) 148/99 (!) 188/79 (!) 145/72  Pulse: 70 75 77 70  Resp:  18 19 16   Temp: 98.4 F (36.9 C) 98.4 F (36.9 C) 98.8 F (37.1 C) 98.8 F (37.1 C)  TempSrc: Oral Oral Oral Oral  SpO2: 97% 97% 98% 95%  Weight:      Height:       Supplemental O2: Room Air SpO2: 95 % O2 Flow Rate (L/min): 2 L/min   Physical Exam:  Constitutional: well-appearing female sitting in chair eating lunch, in no acute distress Cardiovascular: RRR, no murmurs, rubs or gallops Pulmonary/Chest: normal work of breathing on room air, lungs clear to auscultation bilaterally Abdominal: soft, non-tender, non-distended Skin: warm and dry Extremities: Left knee with warm red and tender effusion, worse since yesterday.  No lower extremity pitting edema present Neurologic: No pronator drift, 4+ strength all extremities, normal sensation, equal grip, cranial nerves largely intact  Filed Weights   01/27/22 1533  Weight: 53.5 kg     Intake/Output Summary (Last 24 hours) at 01/29/2022 1433 Last data filed at 01/29/2022 0900 Gross per 24 hour  Intake 868.75 ml  Output 500 ml  Net 368.75 ml   Net IO Since Admission: 618.75 mL [01/29/22 1433]  Pertinent Labs:    Latest Ref Rng & Units 01/29/2022    7:47 AM 01/29/2022    1:31 AM 01/28/2022    5:11 AM  CBC  WBC 4.0 - 10.5 K/uL 9.4  8.9  9.2   Hemoglobin 12.0 - 15.0 g/dL 8.2  6.8  7.1   Hematocrit 36.0 - 46.0 % 25.4  21.1  21.6   Platelets 150 - 400 K/uL 357  340  334        Latest Ref Rng & Units 01/29/2022    1:31 AM 01/28/2022    2:24 PM 01/28/2022    5:11 AM  CMP  Glucose 70 - 99 mg/dL 168  249  268   BUN 6 - 20 mg/dL  29  28  31    Creatinine 0.44 - 1.00 mg/dL 2.86  2.63  2.57   Sodium 135 - 145 mmol/L 136  139  138   Potassium 3.5 - 5.1 mmol/L 3.7  2.7  2.3   Chloride 98 - 111 mmol/L 104  104  106   CO2 22 - 32 mmol/L 23  25  25    Calcium 8.9 - 10.3 mg/dL 7.4  7.5  7.2     I personally reviewed interval labs and pertinent results include:   Inflammatory: Sed rate 110 CRP 7.3  A1c 9.7  Urinalysis Glucose 500 Protein 500 Bacteria many  Urine culture positive for E. coli  Imaging: I personally reviewed interval imaging and my interpretation is as follows:   MRI brain: No acute intracranial abnormality Evidence of prior known stroke  Assessment/Plan:   Principal Problem:   Weakness of left lower extremity Active Problems:   Type 2 diabetes mellitus with peripheral neuropathy (HCC)   Essential hypertension   Tobacco use   Stage 3b chronic kidney disease (CKD) (HCC)  Left sided numbness   Calcium pyrophosphate deposition disease (CPPD)   Patient Summary: Amber Cross is a 61 y.o. with a pertinent PMH of recent CVA, type 2 diabetes, iron deficiency anemia, hypertension, hyperlipidemia, small cell lung cancer, who presented with left knee pain and effusion and admitted for pseudogout.  #Left lower extremity pain and weakness #MRI lumbar spine no acute abnormalities Large knee effusion status post arthrocentesis July 3.  Presence of intracellular calcium pyrophosphate crystals were noted in arthritic fluid.  Neurological exam is unchanged from yesterday thus do not suspect that her weakness is neurologic in nature, rather due to pain and inflamed the -We will perform arthrocentesis - Intra-articular steroids - Prednisone 20 mg p.o. once daily for 5 days  #AKI on CKD 3 #Asymptomatic urinary tract colonization Urine culture positive for E. coli.  Patient is asymptomatic.  We will continue to monitor.  No indication for antibiotics at this time.  Expect resolution of AKI in coming  days. -LR 500 mL IV over 2 hours -Follow-up creatinine  #Type 2 diabetes Hemoglobin A1c 9.7.  Glucose in normal range today.  No insulin requirements today -SSI  #Hypertension -Amlodipine 5 mg p.o. daily - Coreg 50 mg p.o. 2 times daily with meals  #Chronic systolic heart failure with improved ejection fraction - Continue holding home losartan and furosemide in setting of AKI  #Hyperlipidemia Continue home statin  Diet: Normal PT/OT recommendations: Home health IVF: LR,Bolus VTE: Enoxaparin Code: Full Family Update:   Dispo: Anticipated discharge to Home in 2 days pending resolution of knee effusion.   Nani Gasser, MD PGY-1 Internal Medicine Resident Please contact the on call pager after 5 pm and on weekends at [phone].

## 2022-01-29 NOTE — Procedures (Addendum)
Knee Arthrocentesis with Injection Procedure Note  Diagnosis: left CPP arthritis  Indications: Symptomatic relief of large effusion and Crystal induced arthropathy  Anesthesia: Lidocaine 1% without epinephrine  Procedure Details   Point of care ultrasound was used to identify the joint effusion and plan needle trajectory and depth. Consent was obtained for the procedure. The joint was prepped with Betadine. A 22 gauge needle was inserted into the medial aspect of the joint from parapatellar approach. 95 ml of cloudy yellow fluid was removed from the joint and discarded. 2 ml 1% lidocaine and 1 ml (40mg ) of Triamcinolone was then injected into the joint through the same needle. The needle was removed and the area cleansed and dressed.  Complications:  None; patient tolerated the procedure well.

## 2022-01-29 NOTE — Evaluation (Signed)
Physical Therapy Evaluation Patient Details Name: Amber Cross MRN: [id number] DOB: Aug 06, 1960 Today's Date: 01/29/2022  History of Present Illness  61 y.o. female presents to [facility] hospital on 01/27/2022 with L knee pain and LLE weakness. Pt underwent L knee arthrocentesis on 7/3. MRI Lumbar spine negative, MRI brain pending. Pt found to be hypokalemic. PMHx of T2DM, HTN, HLD, anxiety, HFpEF, CKD IIIb.  Clinical Impression  Pt presents to PT with deficits in strength, power, gait, balance, functional mobility. Pt is limited by L knee pain and weakness. Pt bed broken during eval and stuck in high position. PT/OT safely assisted pt in transfers and bed mobility to allow for bed exchange. Pt currently requires physical assistance for all functional mobility tasks. If pain and swelling begin to subside in L knee PT anticipates the pt will progress quickly. Pt expresses a desire to return home with assistance of her son. PT recommends HHPT services. PT will follow up frequently in an effort to progress mobility.       Recommendations for follow up therapy are one component of a multi-disciplinary discharge planning process, led by the attending physician.  Recommendations may be updated based on patient status, additional functional criteria and insurance authorization.  Follow Up Recommendations Home health PT      Assistance Recommended at Discharge Frequent or constant Supervision/Assistance  Patient can return home with the following  A lot of help with walking and/or transfers;A lot of help with bathing/dressing/bathroom;Assistance with cooking/housework;Help with stairs or ramp for entrance;Assist for transportation    Equipment Recommendations Rolling walker (2 wheels)  Recommendations for Other Services       Functional Status Assessment Patient has had a recent decline in their functional status and demonstrates the ability to make significant improvements in function in a reasonable and  predictable amount of time.     Precautions / Restrictions Precautions Precautions: Fall Restrictions Weight Bearing Restrictions: Yes LLE Weight Bearing: Weight bearing as tolerated      Mobility  Bed Mobility Overal bed mobility: Needs Assistance Bed Mobility: Rolling, Sidelying to Sit Rolling: Min assist Sidelying to sit: Mod assist       General bed mobility comments: use of rails, verbal cues for sequencing    Transfers Overall transfer level: Needs assistance Equipment used: Rolling walker (2 wheels) Transfers: Sit to/from Stand, Bed to chair/wheelchair/BSC Sit to Stand: From elevated surface, Mod assist (modA from highlyelevated surface, anticipate possible maxA from lower surface)   Step pivot transfers: Mod assist            Ambulation/Gait Ambulation/Gait assistance: Mod assist Gait Distance (Feet): 2 Feet Assistive device: Rolling walker (2 wheels) Gait Pattern/deviations: Step-to pattern, Decreased stance time - left, Antalgic Gait velocity: reduced Gait velocity interpretation: <1.31 ft/sec, indicative of household ambulator   General Gait Details: pt with reduced weightbearing through LLE  Stairs            Wheelchair Mobility    Modified Rankin (Stroke Patients Only)       Balance Overall balance assessment: Needs assistance Sitting-balance support: Single extremity supported, Bilateral upper extremity supported, Feet supported Sitting balance-Leahy Scale: Poor Sitting balance - Comments: one posterior loss of balance requiring modA to correct when attempting to scoot forward Postural control: Posterior lean Standing balance support: Bilateral upper extremity supported, Reliant on assistive device for balance Standing balance-Leahy Scale: Poor Standing balance comment: minA  Pertinent Vitals/Pain Pain Assessment Pain Assessment: 0-10 Pain Score: 10-Worst pain ever Pain Location: L  knee Pain Descriptors / Indicators: Grimacing Pain Intervention(s): Monitored during session    Home Living Family/patient expects to be discharged to:: Private residence Living Arrangements: Alone Available Help at Discharge: Family;Friend(s);Available PRN/intermittently (son) Type of Home: House Home Access: Stairs to enter Entrance Stairs-Rails: Can reach both Entrance Stairs-Number of Steps: 3-4   Home Layout: One level Home Equipment: Merchant navy officer (4 wheels)      Prior Function Prior Level of Function : Independent/Modified Independent;Driving             Mobility Comments: pt ambulating with use of rollator since CVA ADLs Comments: Could complete showers in standing.     Hand Dominance   Dominant Hand: Right    Extremity/Trunk Assessment   Upper Extremity Assessment Upper Extremity Assessment: Defer to OT evaluation    Lower Extremity Assessment Lower Extremity Assessment: LLE deficits/detail LLE Deficits / Details: ankle DF/PDF 4-/5, knee extension/flexion 3-/5, hip adduction 3-/5 LLE Sensation: decreased light touch    Cervical / Trunk Assessment Cervical / Trunk Assessment: Normal  Communication   Communication: No difficulties  Cognition Arousal/Alertness: Awake/alert Behavior During Therapy: WFL for tasks assessed/performed Overall Cognitive Status: Within Functional Limits for tasks assessed                                          General Comments General comments (skin integrity, edema, etc.): VSS on RA, L knee redness and warmth noted    Exercises     Assessment/Plan    PT Assessment Patient needs continued PT services  PT Problem List Decreased strength;Decreased activity tolerance;Decreased balance;Decreased mobility;Decreased knowledge of use of DME;Pain       PT Treatment Interventions DME instruction;Gait training;Functional mobility training;Therapeutic activities;Stair training;Therapeutic  exercise;Balance training;Neuromuscular re-education;Patient/family education    PT Goals (Current goals can be found in the Care Plan section)  Acute Rehab PT Goals Patient Stated Goal: to go home PT Goal Formulation: With patient Time For Goal Achievement: 02/12/22 Potential to Achieve Goals: Fair    Frequency Min 3X/week     Co-evaluation PT/OT/SLP Co-Evaluation/Treatment: Yes Reason for Co-Treatment: For patient/therapist safety;To address functional/ADL transfers;Other (comment) (seen as co-eval as pt bed broken, stuck in elevated position) PT goals addressed during session: Mobility/safety with mobility;Balance;Proper use of DME;Strengthening/ROM         AM-PAC PT "6 Clicks" Mobility  Outcome Measure Help needed turning from your back to your side while in a flat bed without using bedrails?: A Little Help needed moving from lying on your back to sitting on the side of a flat bed without using bedrails?: A Lot Help needed moving to and from a bed to a chair (including a wheelchair)?: A Lot Help needed standing up from a chair using your arms (e.g., wheelchair or bedside chair)?: A Lot Help needed to walk in hospital room?: Total Help needed climbing 3-5 steps with a railing? : Total 6 Click Score: 11    End of Session   Activity Tolerance: Patient limited by pain Patient left: in chair;with call bell/phone within reach Nurse Communication: Mobility status PT Visit Diagnosis: Other abnormalities of gait and mobility (R26.89);Muscle weakness (generalized) (M62.81);Pain Pain - Right/Left: Left Pain - part of body: Knee    Time: 4010-2725 PT Time Calculation (min) (ACUTE ONLY): 19 min   Charges:  PT Evaluation $PT Eval Low Complexity: 1 Low          Zenaida Niece, PT, DPT Acute Rehabilitation Office [place] 01/29/2022, 12:20 PM

## 2022-01-29 NOTE — Progress Notes (Signed)
  Date: 01/29/2022  Patient name: Amber Cross  Medical record number: [id number]  Date of birth: 1960-09-18   I have seen and evaluated Amber Cross and discussed their care with the Residency Team. Briefly, Amber Cross presented with knee pain and multiple areas of weakness on exam.  The exam was noted to not be consistent with a classic stroke-like pattern and is improved this morning. MRI brain showed no acute abnormality and expected evolution of previous stroke. She was noted to have a large knee effusion on the left and aspirate was consistent with CPPD.  This morning her knee pain is returned and effusion is large.  She denies any change in urinary habits, dysuria or other problems.    Vitals:   01/29/22 0824 01/29/22 1208  BP: (!) 188/79 (!) 145/72  Pulse: 77 70  Resp: 19 16  Temp: 98.8 F (37.1 C) 98.8 F (37.1 C)  SpO2: 98% 95%   Gen: Alert, awake, sitting in chair, no distress Eyes: anicteric sclerae HENT: Neck is supple CV: RR, NR, no murmur MSK: She has a large knee effusion on the left, she has a ballotable knee cap, warm to the touch, does not appear to have overlying cellulitic changes Skin: Warm to the touch over left knee Neuro: Grossly intact, Strength 5/5 in upper extremities, no pronator drift, she has some decreased movement of the LLE, though this seems related to pain.   Assessment and Plan: I have seen and evaluated the patient as outlined above. I agree with the formulated Assessment and Plan as detailed in the residents' note, with the following changes:   Pseudogout: We will attempt another tap today for symptom relief.  I do not think further analysis of the fluid is warranted.  We will start prednisone and also do a steroid injection.   AKI on CKD: IVF will be continued and renal function will be trended.   Weakness: Non focal today, waxing and waning.  Will monitor, consider PT evaluation.   Other issues per residents daily note.   Sid Falcon, MD 7/4/20232:25 PM

## 2022-01-29 NOTE — Plan of Care (Signed)

## 2022-01-30 ENCOUNTER — Inpatient Hospital Stay (HOSPITAL_COMMUNITY): Payer: 59

## 2022-01-30 DIAGNOSIS — R29898 Other symptoms and signs involving the musculoskeletal system: Secondary | ICD-10-CM | POA: Diagnosis not present

## 2022-01-30 LAB — PROTEIN, BODY FLUID (OTHER): Total Protein, Body Fluid Other: 0.6 g/dL

## 2022-01-30 LAB — BPAM RBC
Blood Product Expiration Date: 202308032359
ISSUE DATE / TIME: 202307040414
Unit Type and Rh: 5100

## 2022-01-30 LAB — URINE CULTURE: Culture: >=100000 — AB

## 2022-01-30 LAB — CBC
HCT: 25.3 % — ABNORMAL LOW (ref 36.0–46.0)
Hemoglobin: 8.2 g/dL — ABNORMAL LOW (ref 12.0–15.0)
MCH: 28 pg (ref 26.0–34.0)
MCHC: 32.4 g/dL (ref 30.0–36.0)
MCV: 86.3 fL (ref 80.0–100.0)
Platelets: 370 10*3/uL (ref 150–400)
RBC: 2.93 MIL/uL — ABNORMAL LOW (ref 3.87–5.11)
RDW: 15.6 % — ABNORMAL HIGH (ref 11.5–15.5)
WBC: 11 10*3/uL — ABNORMAL HIGH (ref 4.0–10.5)
nRBC: 0 % (ref 0.0–0.2)

## 2022-01-30 LAB — URINALYSIS, ROUTINE W REFLEX MICROSCOPIC
Bilirubin Urine: NEGATIVE
Glucose, UA: >=500 mg/dL — AB
Ketones, ur: NEGATIVE mg/dL
Leukocytes,Ua: NEGATIVE
Nitrite: NEGATIVE
Protein, ur: >=300 mg/dL — AB
Specific Gravity, Urine: 1.017 (ref 1.005–1.030)
pH: 5 (ref 5.0–8.0)

## 2022-01-30 LAB — BASIC METABOLIC PANEL
Anion gap: 13 (ref 5–15)
BUN: 37 mg/dL — ABNORMAL HIGH (ref 6–20)
CO2: 20 mmol/L — ABNORMAL LOW (ref 22–32)
Calcium: 7.6 mg/dL — ABNORMAL LOW (ref 8.9–10.3)
Chloride: 103 mmol/L (ref 98–111)
Creatinine, Ser: 2.85 mg/dL — ABNORMAL HIGH (ref 0.44–1.00)
GFR, Estimated: 18 mL/min — ABNORMAL LOW (ref >60)
Glucose, Bld: 374 mg/dL — ABNORMAL HIGH (ref 70–99)
Potassium: 4.2 mmol/L (ref 3.5–5.1)
Sodium: 136 mmol/L (ref 135–145)

## 2022-01-30 LAB — GLUCOSE, CAPILLARY
Glucose-Capillary: 245 mg/dL — ABNORMAL HIGH (ref 70–99)
Glucose-Capillary: 366 mg/dL — ABNORMAL HIGH (ref 70–99)
Glucose-Capillary: 310 mg/dL — ABNORMAL HIGH (ref 70–99)
Glucose-Capillary: 366 mg/dL — ABNORMAL HIGH (ref 70–99)

## 2022-01-30 LAB — TYPE AND SCREEN
ABO/RH(D): O POS
Antibody Screen: NEGATIVE
Unit division: 0

## 2022-01-30 LAB — GLUCOSE, BODY FLUID OTHER: Glucose, Body Fluid Other: 188 mg/dL

## 2022-01-30 MED ORDER — INSULIN GLARGINE-YFGN 100 UNIT/ML ~~LOC~~ SOLN
10.0000 [IU] | Freq: Two times a day (BID) | SUBCUTANEOUS | Status: DC
  Administered 2022-01-30 – 2022-01-31 (×3): 10 [IU] via SUBCUTANEOUS
  Filled 2022-01-30 (×4): qty 0.1

## 2022-01-30 MED ORDER — LACTATED RINGERS IV SOLN: INTRAVENOUS | Status: AC

## 2022-01-30 MED ORDER — LIVING WELL WITH DIABETES BOOK
Freq: Once | Status: AC
  Filled 2022-01-30 (×2): qty 1

## 2022-01-30 MED ORDER — INSULIN ASPART 100 UNIT/ML IJ SOLN
8.0000 [IU] | Freq: Once | INTRAMUSCULAR | Status: AC
Start: 2022-01-30 — End: 2022-01-30
  Administered 2022-01-30: 8 [IU] via SUBCUTANEOUS

## 2022-01-30 NOTE — Progress Notes (Signed)
  Date: 01/30/2022  Patient name: Amber Cross  Medical record number: [id number]  Date of birth: 11-11-1960        I have seen and evaluated this patient and I have discussed the plan of care with the house staff. Please see Dr. Hipolito Bayley note for complete details. I concur with his findings and plan.  Hopeful patient can return home tomorrow.  Her breathing is stable today, but will need to monitor closely for volume overload given HFrEF and continued fluids today.   Sid Falcon, MD 01/30/2022, 3:44 PM

## 2022-01-30 NOTE — Progress Notes (Signed)
Subjective:   Summary: Amber Cross is a 61 y.o. year old female currently admitted on the IMTS HD#1 for left knee pain and AKI.  Overnight Events: None  Knee feels significantly better.  Swelling has gone down and mobility is improved.  Patient continues to deny dysuria, urinary frequency, difficulty urinating, hematuria.  She does endorse some mild left flank pain.  Objective:  Vital signs in last 24 hours: Vitals:   01/30/22 0010 01/30/22 0320 01/30/22 0806 01/30/22 1222  BP: (!) 185/82 (!) 172/69 (!) 188/90 (!) 161/66  Pulse: 78 68 75 77  Resp: 19 16 20 20   Temp:  98.1 F (36.7 C) 98.6 F (37 C) 98 F (36.7 C)  TempSrc:  Oral Oral Oral  SpO2:  95% 93% 98%  Weight:      Height:       Physical Exam:  Constitutional: well-appearing female sitting in chair eating breakfast, in no acute distress Cardiovascular: RRR Pulmonary/Chest: normal work of breathing on room air, lungs clear to auscultation bilaterally Abdominal: soft, non-tender, non-distended; no CVA tenderness Skin: warm and dry Extremities: Left knee with minimal effusion.  No lower extremity pitting edema present  [facility] Weights   01/27/22 1533  Weight: 53.5 kg     Intake/Output Summary (Last 24 hours) at 01/30/2022 1418 Last data filed at 01/30/2022 0756 Gross per 24 hour  Intake 742.46 ml  Output 600 ml  Net 142.46 ml   Net IO Since Admission: 1,001.21 mL [01/30/22 1418]  Pertinent Labs:    Latest Ref Rng & Units 01/30/2022    1:07 AM 01/29/2022    7:47 AM 01/29/2022    1:31 AM  CBC  WBC 4.0 - 10.5 K/uL 11.0  9.4  8.9   Hemoglobin 12.0 - 15.0 g/dL 8.2  8.2  6.8   Hematocrit 36.0 - 46.0 % 25.3  25.4  21.1   Platelets 150 - 400 K/uL 370  357  340        Latest Ref Rng & Units 01/30/2022    1:07 AM 01/29/2022    1:31 AM 01/28/2022    2:24 PM  CMP  Glucose 70 - 99 mg/dL 374  168  249   BUN 6 - 20 mg/dL 37  29  28   Creatinine 0.44 - 1.00 mg/dL 2.85  2.86  2.63   Sodium 135 - 145  mmol/L 136  136  139   Potassium 3.5 - 5.1 mmol/L 4.2  3.7  2.7   Chloride 98 - 111 mmol/L 103  104  104   CO2 22 - 32 mmol/L 20  23  25    Calcium 8.9 - 10.3 mg/dL 7.6  7.4  7.5    Imaging: Renal ultrasound: No evidence of urinary obstruction   MRI brain: No acute intracranial abnormality Evidence of prior known stroke  Assessment/Plan:   Principal Problem:   Weakness of left lower extremity Active Problems:   Type 2 diabetes mellitus with peripheral neuropathy (HCC)   Essential hypertension   Tobacco use   Stage 3b chronic kidney disease (CKD) (HCC)   Left sided numbness   Calcium pyrophosphate deposition disease (CPPD)   Pseudogout of knee, left   Patient Summary: Amber Cross is a 61 y.o. with a pertinent PMH of recent CVA, type 2 diabetes, iron deficiency anemia, hypertension, hyperlipidemia, small cell lung cancer, who presented with left knee pain and effusion and admitted  for pseudogout and AKI.  #AKI on CKD 3 #Asymptomatic urinary tract colonization Remains asymptomatic.  Still no indication for antibiotics.  We will continue to monitor.  Creatinine remains elevated at 2.85 despite 2.1 L of fluid.  Renal ultrasound is negative for evidence of postrenal urinary obstruction.  It is possible there is an intrinsic renal component like ATN at play.  Patient denies using NSAIDs for pain control at home. -LR 100 mL/h IV for total of 1 L -Follow-up creatinine  #CPPD Much improved after arthrocentesis and intra-articular steroids July 4. - Prednisone 20 mg p.o. once daily through July 8  #Type 2 diabetes Hemoglobin A1c 9.7.  Now with hyperglycemia likely due to steroid administration.  Appreciate input from diabetes educator. -Insulin glargine 10 units subcutaneous twice daily - SSI  #Hypertension Poorly controlled in the hospital.  Continuing to hold home losartan in setting of AKI -Amlodipine 5 mg p.o. daily - Coreg 50 mg p.o. 2 times daily with meals  #Chronic  systolic heart failure with improved ejection fraction - Continue holding home losartan and furosemide in setting of AKI  #Hyperlipidemia Continue home statin  Diet: Normal PT/OT recommendations: Home health IVF: LR,100cc/hr VTE: Enoxaparin Code: Full Family Update: No   Dispo: Anticipated discharge to Home in 2 days pending resolution of knee effusion.   Nani Gasser, MD PGY-1 Internal Medicine Resident Please contact the on call pager after 5 pm and on weekends at [phone].

## 2022-01-30 NOTE — Plan of Care (Signed)
  Problem: Education: Goal: Knowledge of General Education information will improve Description: Including pain rating scale, medication(s)/side effects and non-pharmacologic comfort measures Outcome: Progressing   Problem: Coping: Goal: Level of anxiety will decrease Outcome: Progressing   Problem: Pain Managment: Goal: General experience of comfort will improve Outcome: Progressing   

## 2022-01-30 NOTE — Progress Notes (Signed)
Physical Therapy Treatment Patient Details Name: Amber Cross MRN: [id number] DOB: 1960-10-16 Today's Date: 01/30/2022   History of Present Illness 61 y.o. female presents to [facility] hospital on 01/27/2022 with L knee pain and LLE weakness. Pt underwent L knee arthrocentesis on 7/3. MRI Lumbar spine negative, MRI brain pending. Pt found to be hypokalemic. PMHx of T2DM, HTN, HLD, anxiety, HFpEF, CKD IIIb.    PT Comments    Pt improving well toward goals post L knee aspiration, though remains weak in bil LE's R>L LE.  Emphasis on transition in/out of bed, sit to stands with UE assist and progression of gait without AD (per pt) though she needs an AD for safe ambulation for safety and if no physical assist.    Recommendations for follow up therapy are one component of a multi-disciplinary discharge planning process, led by the attending physician.  Recommendations may be updated based on patient status, additional functional criteria and insurance authorization.  Follow Up Recommendations  Home health PT     Assistance Recommended at Discharge Frequent or constant Supervision/Assistance  Patient can return home with the following A little help with walking and/or transfers;A little help with bathing/dressing/bathroom;Assistance with cooking/housework;Assist for transportation;Help with stairs or ramp for entrance   Equipment Recommendations  Rolling walker (2 wheels);Other (comment) (shower seat)    Recommendations for Other Services       Precautions / Restrictions Precautions Precautions: Fall Restrictions LLE Weight Bearing: Weight bearing as tolerated     Mobility  Bed Mobility Overal bed mobility: Needs Assistance Bed Mobility: Supine to Sit, Sit to Supine     Supine to sit: Supervision Sit to supine: Supervision   General bed mobility comments: effortful movement of legs back into bed.    Transfers Overall transfer level: Needs assistance   Transfers: Sit to/from  Stand Sit to Stand: From elevated surface, Min guard           General transfer comment: appropriate use of her UE's    Ambulation/Gait Ambulation/Gait assistance: Min assist Gait Distance (Feet): 350 Feet Assistive device: None (rail) Gait Pattern/deviations: Step-through pattern, Decreased step length - right, Decreased step length - left, Decreased stride length Gait velocity: reduced Gait velocity interpretation: <1.8 ft/sec, indicate of risk for recurrent falls   General Gait Details: mildly labored steps as if heavy LE's  R>L with decreased knee flexion and increased lateral w/shifting.  Notable weakness proximally with insignificant increases in gait speed.   Stairs             Wheelchair Mobility    Modified Rankin (Stroke Patients Only)       Balance Overall balance assessment: Needs assistance Sitting-balance support: No upper extremity supported, Feet supported Sitting balance-Leahy Scale: Fair     Standing balance support: No upper extremity supported Standing balance-Leahy Scale: Fair Standing balance comment: can not sustain challenge to balance without external support                            Cognition Arousal/Alertness: Awake/alert Behavior During Therapy: WFL for tasks assessed/performed Overall Cognitive Status: Within Functional Limits for tasks assessed                                          Exercises      General Comments        Pertinent Vitals/Pain Pain  Assessment Pain Assessment: Faces Faces Pain Scale: No hurt Pain Intervention(s): Monitored during session    Home Living                          Prior Function            PT Goals (current goals can now be found in the care plan section) Acute Rehab PT Goals Patient Stated Goal: to go home PT Goal Formulation: With patient Time For Goal Achievement: 02/12/22 Potential to Achieve Goals: Fair Progress towards PT goals:  Progressing toward goals    Frequency    Min 3X/week      PT Plan Current plan remains appropriate    Co-evaluation              AM-PAC PT "6 Clicks" Mobility   Outcome Measure  Help needed turning from your back to your side while in a flat bed without using bedrails?: A Little Help needed moving from lying on your back to sitting on the side of a flat bed without using bedrails?: A Little Help needed moving to and from a bed to a chair (including a wheelchair)?: A Little Help needed standing up from a chair using your arms (e.g., wheelchair or bedside chair)?: A Little Help needed to walk in hospital room?: A Little Help needed climbing 3-5 steps with a railing? : A Lot 6 Click Score: 17    End of Session   Activity Tolerance: Patient tolerated treatment well;Patient limited by fatigue Patient left: in bed;with call bell/phone within reach Nurse Communication: Mobility status PT Visit Diagnosis: Other abnormalities of gait and mobility (R26.89);Muscle weakness (generalized) (M62.81)     Time: 0277-4128 PT Time Calculation (min) (ACUTE ONLY): 22 min  Charges:  $Gait Training: 8-22 mins                     01/30/2022  Ginger Carne., PT Acute Rehabilitation Services [phone]  (pager) [phone]  (office)   Tessie Fass [place] Mousseau 01/30/2022, 6:37 PM

## 2022-01-30 NOTE — Plan of Care (Signed)

## 2022-01-30 NOTE — Hospital Course (Addendum)
Amber Cross is a 61 y.o. with a PMH of poorly controlled T2DM, HTN, small cell lung cancer currently undergoing radiation therapy, acute CVA, and CKD 3 who initially presented with left knee pain with effusion and left leg weakness. She was admitted and worked up for CVA, which was negative. Arthrocentesis revealed CPPD arthritis of the left knee. She also developed acute AKI on CKD. The AKI progressively worsened throughout this admission.  ED course: Patient initially arrived to [facility] hypertensive but otherwise hemodynamically stable. Neurology was consulted, who recommended admission and imaging. IMTS was consulted for admission and patient was transferred to [facility] for further work-up.  LLE weakness Recent punctate infarct Hypokalemia The patient's presentation with a two day history of worsening left lower extremity weakness and persistent numbness/tingling of the left side of her body was concerning for new stroke. She had a history of stroke in 10/2021. She had no saddle anesthesia on exam and was she is severely hypokalemic. Following discussion with neurology, IV electrolyte replacement was begun and MRI of brain and spine was obtained. Neurologic exam remained unchanged throughout admission and MRI showed no acute intracranial abnormalities. Weakness was determined to not have neurological etiology.  CPPD arthritis of left knee In the ED, left knee xray showed a large joint effusion in the background of chondrocalcinosis consistent with underlying calcium pyrophosphate dihydrate crystal deposition disease.  Arthrocentesis was completed in the ED, revealing intracellular calcium pyrophosphate crystals. On 7/3, knee arthrocentesis with intra-articular triamcinolone injection was performed, which the patient tolerated well. Her knee was markedly less swollen and painful with greater ROM s/p arthrocentesis and 5-day course of oral prednisone (7/4-7/8). Prednisone was discontinued 1 day early on 7/7  due to persistently high blood glucose levels in the setting of AKI.  AKI on CKD 3 Nephrotic range proteinuria The patient's serum creatinine was elevated to 2.63 w/ GFR of 20 on admission, with baseline suspected to be Cr 1.7 and GFR 30. She was started on LR IV and urine culture was obtained, which showed E. Coli colonization, but patient remained asymptomatic with no indication for antibiotic treatment. Renal ultrasound showed medical renal disease changes of both kidneys and a small right renal cyst. Light chains and SPEP studies showed ***. Nephrology was brought on board 7/6 and advised that worsening AKI appears to be hemodynamically mediated and exacerbated by hyperglycemia and HTN. Diuresis with IV Lasix and albumin was attempted by nephrology twice*** and creatinine decreased to *** with better concurrent hypertension and hyperglycemia control. Renal artery duplex ultrasound was obtained and showed ***. Patient's creatinine peaked at *** on ***. Nephrotoxic medications and contrast were avoided during duration of admission and patient's home losartan was held per nephrology.  Hypertension The patient's home medications carvedilol and amlodipine were restarted in the ED, but losartan was held in the setting of AKI. Patient continued to be hypertensive in the range of 140/60 - 190/80 and hydralazine 25 mg PO every 8 hours was added. Patient's hypertension proved refractory and amlodipine and hydralazine doses were increased. Blood pressure was at *** at discharge.  Type 2 diabetes The patient's A1c was 9.7% on admission and CBGs were elevated in the 200s. She was placed on 10 units long acting QHS and SSI upon admission and blood glucose was well controlled for the first few days of her hospital stay unitl blood glucose began increasing into the 200s again. Her insulin was increased to 15 units long acting twice daily as well as 5 units short acting with meals  and SSI. She was discharged on her  home regimen***.   Insomnia The patient reported poor sleep and high levels of stress on 7/8 and was prescribed hydroxyzine 25 mg PRN at night along encouragement of daytime open blinds and nighttime darkness in room.  Anemia Patient has baseline anemia with hemoglobin in the 8.0-9.0 range, likely secondary to chronic illness. Iron studies showed decreased TIBC. Patient's hemoglobin was found to be 6.8 overnight on 7/4 and she was administered a blood transfusion. Ferric gluconate was administered on 7/8 and follow-up CBC showed ***.  Chronic diarrhea Bowel incontinence The patient reported chronic diarrhea with 6+ bowel movements daily since April on admission. TSH and inflammatory markers were obtained. TSH was WNL, ESR was elevated to 110, and CRP was elevated to 7.3. Patient did not have profuse diarrhea during this admission. Plan to follow up outpatient.  Chronic systolic heart failure with improved ejection fraction The patient previously had EF 25% with most recent echo showing 55-60% on 10/2021. She appeared euvolemic with no signs of acute heart failure. Furosemide and losartan were initially held due to AKI and home carvedilol was maintained. Diuresis with Lasix was initiated later during admission due to worsening AKI.  Hyperlipidemia Patient was continued on home rosuvastatin 40 mg PO daily throughout this admission.

## 2022-01-30 NOTE — Social Work (Signed)
CSW attempted to stop by pt room to speak with her about questions she has regarding disability. Pt was not in the room. CSW will continue to follow.

## 2022-01-30 NOTE — Inpatient Diabetes Management (Addendum)
Inpatient Diabetes Program Recommendations  AACE/ADA: New Consensus Statement on Inpatient Glycemic Control (2015)  Target Ranges:  Prepandial:   less than 140 mg/dL      Peak postprandial:   less than 180 mg/dL (1-2 hours)      Critically ill patients:  140 - 180 mg/dL   Lab Results  Component Value Date   GLUCAP 366 (H) 01/30/2022   HGBA1C 9.7 (H) 01/28/2022    Review of Glycemic Control  Latest Reference Range & Units 01/30/22 08:04 01/30/22 12:20  Glucose-Capillary 70 - 99 mg/dL 245 (H) 366 (H)  (H): Data is abnormally high  Diabetes history: DM2 Outpatient Diabetes medications: 70/30 10 units BID Current orders for Inpatient glycemic control: Semglee 10 units BID, Novolog 0-15 units TID and Prednisone 20 mg QD  Inpatient Diabetes Program Recommendations:    Novolog 3 units TID with meals.  Spoke with patient at bedside.  Reviewed patient's current A1c of 9.7% (average BS of 232 mg/dL). Explained what a A1c is and what it measures. Also reviewed goal A1c with patient, importance of good glucose control @ home, and blood sugar goals.  She states that is down from 12% in April.  Encouraged her to keep moving in that direction with a goal of <7%.    Educated on The Plate Method, CHO's, portion control, CBGs at home fasting and mid afternoon, F/U with PCP every 3 months, bring meter to PCP office, long and short term complications of uncontrolled BG, and importance of exercise.  Ordered LWWD booklet.    Will continue to follow while inpatient.  Thank you, Reche Dixon, MSN, [place] Diabetes Coordinator Inpatient Diabetes Program [phone] (team pager from 8a-5p)

## 2022-01-31 ENCOUNTER — Ambulatory Visit
Admission: RE | Admit: 2022-01-31 | Discharge: 2022-01-31 | Disposition: A | Discharge status: Home / Self Care | Payer: 59 | Source: Ambulatory Visit | Attending: Radiation Oncology | Admitting: Radiation Oncology

## 2022-01-31 ENCOUNTER — Encounter: Payer: Self-pay | Admitting: Urology

## 2022-01-31 ENCOUNTER — Ambulatory Visit: Payer: 59 | Admitting: Radiation Oncology

## 2022-01-31 ENCOUNTER — Encounter: Payer: Self-pay | Admitting: Radiation Oncology

## 2022-01-31 ENCOUNTER — Other Ambulatory Visit: Payer: Self-pay

## 2022-01-31 DIAGNOSIS — C3411 Malignant neoplasm of upper lobe, right bronchus or lung: Secondary | ICD-10-CM

## 2022-01-31 DIAGNOSIS — R29898 Other symptoms and signs involving the musculoskeletal system: Secondary | ICD-10-CM | POA: Diagnosis not present

## 2022-01-31 LAB — COMPREHENSIVE METABOLIC PANEL
ALT: 12 U/L (ref 0–44)
AST: 13 U/L — ABNORMAL LOW (ref 15–41)
Albumin: 1.6 g/dL — ABNORMAL LOW (ref 3.5–5.0)
Alkaline Phosphatase: 118 U/L (ref 38–126)
Anion gap: 15 (ref 5–15)
BUN: 43 mg/dL — ABNORMAL HIGH (ref 6–20)
CO2: 18 mmol/L — ABNORMAL LOW (ref 22–32)
Calcium: 8.1 mg/dL — ABNORMAL LOW (ref 8.9–10.3)
Chloride: 100 mmol/L (ref 98–111)
Creatinine, Ser: 2.93 mg/dL — ABNORMAL HIGH (ref 0.44–1.00)
GFR, Estimated: 18 mL/min — ABNORMAL LOW (ref >60)
Glucose, Bld: 351 mg/dL — ABNORMAL HIGH (ref 70–99)
Potassium: 4.2 mmol/L (ref 3.5–5.1)
Sodium: 133 mmol/L — ABNORMAL LOW (ref 135–145)
Total Bilirubin: 0.3 mg/dL (ref 0.3–1.2)
Total Protein: 5.4 g/dL — ABNORMAL LOW (ref 6.5–8.1)

## 2022-01-31 LAB — CBC
HCT: 25 % — ABNORMAL LOW (ref 36.0–46.0)
Hemoglobin: 8.1 g/dL — ABNORMAL LOW (ref 12.0–15.0)
MCH: 27.6 pg (ref 26.0–34.0)
MCHC: 32.4 g/dL (ref 30.0–36.0)
MCV: 85.3 fL (ref 80.0–100.0)
Platelets: 397 10*3/uL (ref 150–400)
RBC: 2.93 MIL/uL — ABNORMAL LOW (ref 3.87–5.11)
RDW: 15.1 % (ref 11.5–15.5)
WBC: 15.8 10*3/uL — ABNORMAL HIGH (ref 4.0–10.5)
nRBC: 0 % (ref 0.0–0.2)

## 2022-01-31 LAB — CREATININE, URINE, RANDOM: Creatinine, Urine: 52.63 mg/dL

## 2022-01-31 LAB — RAD ONC ARIA SESSION SUMMARY
Course Elapsed Days: 9
Plan Fractions Treated to Date: 3
Plan Prescribed Dose Per Fraction: 18 Gy
Plan Total Fractions Prescribed: 3
Plan Total Prescribed Dose: 54 Gy
Reference Point Dosage Given to Date: 54 Gy
Reference Point Session Dosage Given: 18 Gy
Session Number: 3

## 2022-01-31 LAB — GLUCOSE, CAPILLARY
Glucose-Capillary: 376 mg/dL — ABNORMAL HIGH (ref 70–99)
Glucose-Capillary: 183 mg/dL — ABNORMAL HIGH (ref 70–99)
Glucose-Capillary: 105 mg/dL — ABNORMAL HIGH (ref 70–99)
Glucose-Capillary: 297 mg/dL — ABNORMAL HIGH (ref 70–99)

## 2022-01-31 LAB — BODY FLUID CULTURE W GRAM STAIN
Culture: NO GROWTH
Gram Stain: NONE SEEN

## 2022-01-31 LAB — PROTEIN, URINE, RANDOM: Total Protein, Urine: 485 mg/dL

## 2022-01-31 LAB — VITAMIN D 25 HYDROXY (VIT D DEFICIENCY, FRACTURES): Vit D, 25-Hydroxy: 7.54 ng/mL — ABNORMAL LOW (ref 30–100)

## 2022-01-31 LAB — HIV ANTIBODY (ROUTINE TESTING W REFLEX): HIV Screen 4th Generation wRfx: NONREACTIVE

## 2022-01-31 LAB — VITAMIN B12: Vitamin B-12: 620 pg/mL (ref 180–914)

## 2022-01-31 MED ORDER — VITAMIN D (ERGOCALCIFEROL) 1.25 MG (50000 UNIT) PO CAPS
50000.0000 [IU] | ORAL_CAPSULE | ORAL | Status: DC
  Administered 2022-01-31: 50000 [IU] via ORAL
  Filled 2022-01-31: qty 1

## 2022-01-31 MED ORDER — INSULIN GLARGINE-YFGN 100 UNIT/ML ~~LOC~~ SOLN
15.0000 [IU] | Freq: Two times a day (BID) | SUBCUTANEOUS | Status: DC
Start: 2022-01-31 — End: 2022-02-03
  Administered 2022-01-31 – 2022-02-03 (×6): 15 [IU] via SUBCUTANEOUS
  Filled 2022-01-31 (×7): qty 0.15

## 2022-01-31 MED ORDER — INSULIN ASPART 100 UNIT/ML IJ SOLN
5.0000 [IU] | Freq: Three times a day (TID) | INTRAMUSCULAR | Status: DC
  Administered 2022-01-31 – 2022-02-03 (×7): 5 [IU] via SUBCUTANEOUS

## 2022-01-31 NOTE — TOC Initial Note (Addendum)
Transition of Care [facility]) - Initial/Assessment Note    Patient Details  Name: Amber Cross MRN: [id number] Date of Birth: 27-Aug-1960  [facility] CM/SW Contact:    Cyndi Bender, RN Phone Number: 01/31/2022, 1:04 PM  Clinical Narrative:                 Spoke to patient regarding transition needs. Patient lives alone but has a son that is moving her to lives with her. She is having financial issues. She has been denied for medicaid due to owning her house and car. Patient is currently having radiation and has debt from that. Patient states after her recent CVA she was unable to afford home health.  Advice worker regarding medicaid denial.  [facility] is not in-network with [employer].  Jermaine with [employer] is checking if they are in-network. Jermaine accepted DME order. Patient deferred to [facility] to find agency that is highly rated. Cindie with [facility] accepted referral. TOC will continue to follow.   Expected Discharge Plan: [place] Barriers to Discharge: Continued Medical Work up   Patient Goals and CMS Choice Patient states their goals for this hospitalization and ongoing recovery are:: return home      Expected Discharge Plan and Services Expected Discharge Plan: [place]   Discharge Planning Services: CM Consult   Living arrangements for the past 2 months: Single Family Home                                      Prior Living Arrangements/Services Living arrangements for the past 2 months: Single Family Home Lives with:: Self Patient language and need for interpreter reviewed:: Yes        Need for Family Participation in Patient Care: Yes (Comment) Care giver support system in place?: Yes (comment)   Criminal Activity/Legal Involvement Pertinent to Current Situation/Hospitalization: No - Comment as needed  Activities of Daily Living Home Assistive Devices/Equipment: Cane (specify quad or  straight), Walker (specify type) ADL Screening (condition at time of admission) Patient's cognitive ability adequate to safely complete daily activities?: Yes Is the patient deaf or have difficulty hearing?: No Does the patient have difficulty seeing, even when wearing glasses/contacts?: No Does the patient have difficulty concentrating, remembering, or making decisions?: No Patient able to express need for assistance with ADLs?: Yes Does the patient have difficulty dressing or bathing?: No Independently performs ADLs?: Yes (appropriate for developmental age) Does the patient have difficulty walking or climbing stairs?: Yes Weakness of Legs: Left Weakness of Arms/Hands: Left  Permission Sought/Granted                  Emotional Assessment Appearance:: Appears stated age Attitude/Demeanor/Rapport: Crying, Grieving Affect (typically observed): Accepting, Pleasant, Depressed, Appropriate Orientation: : Oriented to Self, Oriented to Place, Oriented to  Time, Oriented to Situation Alcohol / Substance Use: Not Applicable Psych Involvement: No (comment)  Admission diagnosis:  Hypokalemia [E87.6] Left leg weakness [R29.898] Left sided numbness [R20.0] Pseudogout of left knee [M11.262] Weakness of left lower extremity [R29.898] Effusion, left knee [M25.462] Pseudogout of knee, left [M11.262] Patient Active Problem List   Diagnosis Date Noted   Pseudogout of knee, left 01/29/2022   Weakness of left lower extremity 01/28/2022   Calcium pyrophosphate deposition disease (CPPD) 01/28/2022   Left sided numbness 01/27/2022   Small cell lung cancer, right upper lobe ([place]) 01/01/2022  Chronic diarrhea 11/30/2021   Medication management 11/30/2021   History of CVA (cerebrovascular accident) 11/30/2021   Malnutrition of moderate degree 11/23/2021   Severe hypertension 09/32/3557   Chronic systolic CHF (congestive heart failure) ([place]) 11/07/2021   Pulmonary nodules 11/07/2021    Normocytic anemia 11/07/2021   Proteinuria 11/07/2021   Stage 3b chronic kidney disease (CKD) ([place]) 11/07/2021   Chest pain 05/07/2021   Hyperlipidemia LDL goal <100 04/16/2021   Atrophic vaginitis 05/08/2020   Diabetic neuropathy ([place])  07/20/2019   Tobacco use 07/20/2019   Weight loss 07/20/2019   Essential hypertension 05/03/2019   Type 2 diabetes mellitus with peripheral neuropathy ([place]) 04/06/2019   PCP:  [name], DO Pharmacy:   [facility] [phone] - [place], [place] [address] [place] [address] Phone: [phone] Fax: [place] [address] [place] [address] Phone: [phone] Fax: [phone]  [place], [place] [address] Phone: [phone] Fax: [phone]     Social Determinants of Health (SDOH) Interventions    Readmission Risk Interventions     No data to display

## 2022-01-31 NOTE — Progress Notes (Addendum)
Subjective:   Summary: Amber Cross is a 61 y.o. year old female currently admitted on the IMTS HD#2 for CPPD arthritis, AKI on CKD.  Overnight Events: Left knee pain for which oxycodone was administered.  Ms. Jocelyn Lamer is in poor spirits today.  Knee effusion is worse than yesterday.  She expounds on her decreased urination, which has been ongoing for weeks.  It is also associated with sensation of bladder fullness.  She is also concerned because she is scheduled for radiation therapy at [facility] today and does not want to miss it as it is her last appointment.  Objective:  Vital signs: Vitals:   01/30/22 2013 01/30/22 2359 01/31/22 0427 01/31/22 0805  BP: (!) 154/74 (!) 163/63 (!) 166/73 (!) 181/80  Pulse: 74 70 75 77  Resp:   15 16  Temp: 98.4 F (36.9 C) 97.9 F (36.6 C) 98.4 F (36.9 C) 98.1 F (36.7 C)  TempSrc: Oral Oral Oral Oral  SpO2: 98% 96% 96% 93%  Weight:      Height:       Physical exam: Constitutional: Sad appearing female sitting upright in bed in no apparent distress Cardiovascular: Right radial pulse 2+, no pedal edema Pulmonary/Chest: Normal work of breathing Abdominal: Suprapubic tenderness to deep palpation Skin: No rashes or lesions on visible skin Extremities: Left knee effusion and tenderness  Filed Weights   01/27/22 1533  Weight: 53.5 kg     Intake/Output Summary (Last 24 hours) at 01/31/2022 1232 Last data filed at 01/31/2022 1028 Gross per 24 hour  Intake 0 ml  Output 1700 ml  Net -1700 ml   Net IO Since Admission: -698.79 mL [01/31/22 1232]  Pertinent Labs:    Latest Ref Rng & Units 01/31/2022    7:40 AM 01/30/2022    1:07 AM 01/29/2022    7:47 AM  CBC  WBC 4.0 - 10.5 K/uL 15.8  11.0  9.4   Hemoglobin 12.0 - 15.0 g/dL 8.1  8.2  8.2   Hematocrit 36.0 - 46.0 % 25.0  25.3  25.4   Platelets 150 - 400 K/uL 397  370  357        Latest Ref Rng & Units 01/31/2022    1:56 AM 01/30/2022    1:07 AM 01/29/2022    1:31 AM  CMP   Glucose 70 - 99 mg/dL 351  374  168   BUN 6 - 20 mg/dL 43  37  29   Creatinine 0.44 - 1.00 mg/dL 2.93  2.85  2.86   Sodium 135 - 145 mmol/L 133  136  136   Potassium 3.5 - 5.1 mmol/L 4.2  4.2  3.7   Chloride 98 - 111 mmol/L 100  103  104   CO2 22 - 32 mmol/L _0 Calcium 8.9 - 10.3 mg/dL 8.1  7.6  7.4   Total Protein 6.5 - 8.1 g/dL 5.4     Total Bilirubin 0.3 - 1.2 mg/dL 0.3     Alkaline Phos 38 - 126 U/L 118     AST 15 - 41 U/L 13     ALT 0 - 44 U/L 12       Vitamin D, 25-hydroxy: 7.54 (30-100 ng/mL) Urine protein to creatinine ratio: 9.2  Assessment/Plan:   Principal Problem:   Weakness of left lower extremity Active Problems:   Type 2 diabetes mellitus with  peripheral neuropathy (HCC)   Essential hypertension   Tobacco use   Stage 3b chronic kidney disease (CKD) (HCC)   Left sided numbness   Calcium pyrophosphate deposition disease (CPPD)   Pseudogout of knee, left   Patient Summary: Amber Cross is a 61 y.o. with a PMH of type 2 diabetes, CKD 3, chronic normocytic anemia who presented with left knee swelling and pain and admitted for CPPD and AKI.    #AKI on CKD #Urinary retention #Asymptomatic bacteriuria Creatinine still elevated from baseline despite administration of fluids during this admission, thus reducing the likelihood that etiology is prerenal.  Urine protein to creatinine ratio is 9.2, well into nephrotic range. Given finding of bladder distention on renal ultrasound, will continue working up for postrenal etiology. No obvious obstruction on renal ultrasound.  Not currently taking inpatient anticholinergic medications.  Differential also includes intrinsic renal etiology like monoclonal gammopathy, multiple myeloma or HIV-associated nephropathy.  Still asymptomatic with bacteriuria so will hold off on antibiotics. - Will consult nephrology - Follow-up postvoid residual and if high order in and out cath - Follow-up HIV test - Follow-up SPEP and  light chain studies - Daily BMP - Discontinued fluids  #Type 2 diabetes with hyperglycemia complicated by steroid use Patient received 51 units of insulin yesterday and blood glucose remains elevated. - Increased insulin glargine to 15 units twice daily - Start insulin aspart 5 units 3 times daily with meals - Continue SSI  #Coordination of care Patient has appointment for radiation therapy at [place] at 2:30 PM today.  Will discuss with charge nurse to organize transportation to [place] for her appointment.  #CPPD Status post arthrocentesis and intra-articular steroids. On day 3 of 5 of oral prednisone.  Effusion is slowly reaccumulating.  Will continue to monitor while patient remains in the hospital. - Continue prednisone 20 mg oral daily through 02/02/2022 to complete 5-day course  #Hypertension - Amlodipine 5 mg p.o. daily - Coreg 50 mg p.o. twice daily with meals - Hold home losartan in setting of AKI  #Hyperlipidemia Continue home statin  Diet: Normal IVF: None,None VTE: Enoxaparin Code: Full PT/OT recs: Home Health, rolling walker.   Dispo: Anticipated discharge to Home in 2 days pending diagnosis and management of kidney condition.   , , MD 01/31/2022, 12:32 PM  Pager: [phone] After 5pm on weekdays and 1pm on weekends: On Call pager: [phone]  

## 2022-01-31 NOTE — Progress Notes (Signed)
CSW received request to speak with patient regarding disability denial. CSW spoke with patient and family at bedside and explained she will likely have to keep applying and continuing to submit hospital records and bills before she will get approved. She stated she now has a lawyer involved to help her.   Gilmore Laroche, MSW, [facility]

## 2022-01-31 NOTE — Plan of Care (Signed)

## 2022-01-31 NOTE — Inpatient Diabetes Management (Signed)
Inpatient Diabetes Program Recommendations  AACE/ADA: New Consensus Statement on Inpatient Glycemic Control (2015)  Target Ranges:  Prepandial:   less than 140 mg/dL      Peak postprandial:   less than 180 mg/dL (1-2 hours)      Critically ill patients:  140 - 180 mg/dL   Lab Results  Component Value Date   GLUCAP 376 (H) 01/31/2022   HGBA1C 9.7 (H) 01/28/2022    Review of Glycemic Control  Latest Reference Range & Units 01/30/22 08:04 01/30/22 12:20 01/30/22 17:52 01/30/22 20:25 01/31/22 08:05  Glucose-Capillary 70 - 99 mg/dL 245 (H) 366 (H) 310 (H) 366 (H) 376 (H)  (H): Data is abnormally high  Diabetes history: DM2 Outpatient Diabetes medications: 70/30 10 units BID Current orders for Inpatient glycemic control: Semglee 10 units BID, Novolog 0-15 units TID and Prednisone 20 mg QD    Inpatient Diabetes Program Recommendations:    Novolog 0-20 units TID and HS (while receiving steroids) Semglee 15 units BID  Will continue to follow while inpatient.  Thank you, Reche Dixon, MSN, [place] [facility] Coordinator Inpatient Diabetes Program [phone] (team pager from 8a-5p)

## 2022-02-01 DIAGNOSIS — R29898 Other symptoms and signs involving the musculoskeletal system: Secondary | ICD-10-CM | POA: Diagnosis not present

## 2022-02-01 LAB — CBC
HCT: 25.8 % — ABNORMAL LOW (ref 36.0–46.0)
HCT: 25.8 % — ABNORMAL LOW (ref 36.0–46.0)
Hemoglobin: 8.3 g/dL — ABNORMAL LOW (ref 12.0–15.0)
Hemoglobin: 8.5 g/dL — ABNORMAL LOW (ref 12.0–15.0)
MCH: 27.7 pg (ref 26.0–34.0)
MCH: 28.5 pg (ref 26.0–34.0)
MCHC: 32.2 g/dL (ref 30.0–36.0)
MCHC: 32.9 g/dL (ref 30.0–36.0)
MCV: 86 fL (ref 80.0–100.0)
MCV: 86.6 fL (ref 80.0–100.0)
Platelets: 418 10*3/uL — ABNORMAL HIGH (ref 150–400)
Platelets: 439 10*3/uL — ABNORMAL HIGH (ref 150–400)
RBC: 3 MIL/uL — ABNORMAL LOW (ref 3.87–5.11)
RBC: 2.98 MIL/uL — ABNORMAL LOW (ref 3.87–5.11)
RDW: 15 % (ref 11.5–15.5)
RDW: 15.1 % (ref 11.5–15.5)
WBC: 14.6 10*3/uL — ABNORMAL HIGH (ref 4.0–10.5)
WBC: 14.4 10*3/uL — ABNORMAL HIGH (ref 4.0–10.5)
nRBC: 0 % (ref 0.0–0.2)
nRBC: 0.1 % (ref 0.0–0.2)

## 2022-02-01 LAB — FERRITIN: Ferritin: 259 ng/mL (ref 11–307)

## 2022-02-01 LAB — BASIC METABOLIC PANEL
Anion gap: 8 (ref 5–15)
BUN: 48 mg/dL — ABNORMAL HIGH (ref 6–20)
CO2: 21 mmol/L — ABNORMAL LOW (ref 22–32)
Calcium: 7.6 mg/dL — ABNORMAL LOW (ref 8.9–10.3)
Chloride: 106 mmol/L (ref 98–111)
Creatinine, Ser: 3.11 mg/dL — ABNORMAL HIGH (ref 0.44–1.00)
GFR, Estimated: 17 mL/min — ABNORMAL LOW (ref >60)
Glucose, Bld: 293 mg/dL — ABNORMAL HIGH (ref 70–99)
Potassium: 3.8 mmol/L (ref 3.5–5.1)
Sodium: 135 mmol/L (ref 135–145)

## 2022-02-01 LAB — IRON AND TIBC
Iron: 39 ug/dL (ref 28–170)
Saturation Ratios: 20 % (ref 10.4–31.8)
TIBC: 192 ug/dL — ABNORMAL LOW (ref 250–450)
UIBC: 153 ug/dL

## 2022-02-01 LAB — DIFFERENTIAL
Abs Immature Granulocytes: 0.09 10*3/uL — ABNORMAL HIGH (ref 0.00–0.07)
Basophils Absolute: 0 10*3/uL (ref 0.0–0.1)
Basophils Relative: 0 %
Eosinophils Absolute: 0 10*3/uL (ref 0.0–0.5)
Eosinophils Relative: 0 %
Immature Granulocytes: 1 %
Lymphocytes Relative: 5 %
Lymphs Abs: 0.7 10*3/uL (ref 0.7–4.0)
Monocytes Absolute: 0.6 10*3/uL (ref 0.1–1.0)
Monocytes Relative: 4 %
Neutro Abs: 13 10*3/uL — ABNORMAL HIGH (ref 1.7–7.7)
Neutrophils Relative %: 90 %

## 2022-02-01 LAB — GLUCOSE, CAPILLARY
Glucose-Capillary: 265 mg/dL — ABNORMAL HIGH (ref 70–99)
Glucose-Capillary: 189 mg/dL — ABNORMAL HIGH (ref 70–99)
Glucose-Capillary: 190 mg/dL — ABNORMAL HIGH (ref 70–99)
Glucose-Capillary: 279 mg/dL — ABNORMAL HIGH (ref 70–99)

## 2022-02-01 MED ORDER — HYDRALAZINE HCL 25 MG PO TABS
25.0000 mg | ORAL_TABLET | Freq: Three times a day (TID) | ORAL | Status: DC
  Administered 2022-02-01 – 2022-02-02 (×3): 25 mg via ORAL
  Filled 2022-02-01 (×3): qty 1

## 2022-02-01 MED ORDER — ALBUMIN HUMAN 25 % IV SOLN
12.5000 g | Freq: Once | INTRAVENOUS | Status: AC
  Administered 2022-02-01: 12.5 g via INTRAVENOUS
  Filled 2022-02-01: qty 50

## 2022-02-01 MED ORDER — FUROSEMIDE 10 MG/ML IJ SOLN
INTRAMUSCULAR | Status: AC
  Filled 2022-02-01: qty 4

## 2022-02-01 MED ORDER — FUROSEMIDE 40 MG PO TABS: 40.0000 mg | ORAL_TABLET | Freq: Every day | ORAL | Status: DC

## 2022-02-01 MED ORDER — FUROSEMIDE 10 MG/ML IJ SOLN
40.0000 mg | Freq: Once | INTRAMUSCULAR | Status: AC
  Administered 2022-02-01: 40 mg via INTRAVENOUS
  Filled 2022-02-01: qty 4

## 2022-02-01 NOTE — Progress Notes (Signed)
Subjective:   Summary: Amber Cross is a 61 y.o. year old female currently admitted on the IMTS HD#3 for AKI on CKD and nephrotic range proteinuria.  Interval events: Patient received last SBRT treatment yesterday.  Postvoid residual was small.  Patient endorses some worsening left knee pain and swelling.  She also notes dramatically increased swelling of lower extremities.  She denies shortness of breath.  She denies chest pain.  She denies urinary retention today.  Her spirits are low.  Objective:  Vital signs: Blood pressure (!) 183/74, pulse 71, temperature 98.1 F (36.7 C), temperature source Oral, resp. rate 16, height 5' 7.5" (1.715 m), weight 53.5 kg, last menstrual period 09/29/2011, SpO2 99 %.   Physical exam: Constitutional: Sad appearing female sitting upright in bed in no acute distress Cardiovascular: Regular rate and rhythm, grade 2 out of 5 systolic murmur Pulmonary: Clear to auscultation bilaterally, normal work of breathing noted Skin: No rashes or lesions on visible skin Extremities: 2+ pitting edema bilateral legs up to knees Neuro: Alert and oriented Psych: Sad affect   Intake/Output Summary (Last 24 hours) at 02/01/2022 1344 Last data filed at 02/01/2022 0750 Gross per 24 hour  Intake --  Output 900 ml  Net -900 ml    Pertinent Labs:    Latest Ref Rng & Units 02/01/2022   12:55 AM 01/31/2022    7:40 AM 01/30/2022    1:07 AM  CBC  WBC 4.0 - 10.5 K/uL 4.0 - 10.5 K/uL 14.6    14.4  15.8  11.0   Hemoglobin 12.0 - 15.0 g/dL 12.0 - 15.0 g/dL 8.3    8.5  8.1  8.2   Hematocrit 36.0 - 46.0 % 36.0 - 46.0 % 25.8    25.8  25.0  25.3   Platelets 150 - 400 K/uL 150 - 400 K/uL 418    439  397  370        Latest Ref Rng & Units 02/01/2022   12:55 AM 01/31/2022    1:56 AM 01/30/2022    1:07 AM  CMP  Glucose 70 - 99 mg/dL 293  351  374   BUN 6 - 20 mg/dL 48  43  37   Creatinine 0.44 - 1.00 mg/dL 3.11  2.93  2.85   Sodium 135 - 145 mmol/L 135   133  136   Potassium 3.5 - 5.1 mmol/L 3.8  4.2  4.2   Chloride 98 - 111 mmol/L 106  100  103   CO2 22 - 32 mmol/L 21  18  20    Calcium 8.9 - 10.3 mg/dL 7.6  8.1  7.6   Total Protein 6.5 - 8.1 g/dL  5.4    Total Bilirubin 0.3 - 1.2 mg/dL  0.3    Alkaline Phos 38 - 126 U/L  118    AST 15 - 41 U/L  13    ALT 0 - 44 U/L  12     Iron: 39 mcg/dL (28 to 170 mcg/dL) TIBC: 192 mcg/dL (250 to 450 mcg/dL) Ferritin: 259 ng/mL (11 to 307 ng/mL)  Assessment/Plan:   Principal Problem:   Weakness of left lower extremity Active Problems:   Type 2 diabetes mellitus with peripheral neuropathy (HCC)   Essential hypertension   Tobacco use   Stage 3b chronic kidney disease (CKD) (HCC)   Left sided numbness   Calcium pyrophosphate deposition disease (CPPD)  Pseudogout of knee, left   Patient Summary: Amber Cross is a 61 y.o. with a PMH of type 2 diabetes poorly controlled, hypertension, CKD 3, who presented with left knee pain and effusion and admitted for CPPD arthritis of the left knee and AKI on CKD.  AKI has worsened steadily for duration of admission.  AKI on CKD 3 Nephrotic range proteinuria Creatinine elevated yet again today to 3.1.  Postvoid residual normal.  Fluids have been stopped.  UPC ratio 9.2 which is actually decreased from a UPC ratio of 16.42 on 11/07/2021 (per chart review).  Nephrology has suggested that her worsening AKI could be hemodynamically mediated.  Thus we will begin diuresing to attempt to reverse course of patient's AKI. - Follow-up renal function tomorrow after a dose of Lasix and albumin - Avoid nephrotoxic medications including NSAIDs and contrast - Strict I's and O's - Follow-up light chains and SPEP - Discontinued Protonix  Type 2 diabetes 48 units of insulin yesterday with some improvement in blood sugar over the last 24 hours. - Continue insulin glargine 15 units subcutaneous twice daily - Continue insulin aspart 5 units subcutaneous 3 times daily with  meals - SSI - Discontinue steroids  Hypertension Uncontrolled during this admission. - Start hydralazine 25 mg oral every 8 hours - Continue amlodipine 5 mg oral daily - Continue Coreg 50 mg oral twice daily - Continue holding home losartan in setting of AKI  CPPD arthritis of left knee Status post arthrocentesis and intra-articular steroids.  DC'd oral prednisone after 4-day course.  Effusion is reaccumulating.  Pain is worsening.  May need to do another arthrocentesis.  Hyperlipidemia Continue home statin  Diet: Carb-Modified IVF: None,None VTE: Enoxaparin Code: Full PT/OT recs: Home Health, other rolling walker. Family Update: family present for visit today   Dispo: Anticipated discharge to Home in 2 days pending workup and treatment of AKI.   Nani Gasser, MD 02/01/2022, 1:44 PM  Pager: [phone] After 5pm on weekdays and 1pm on weekends: On Call pager: [phone]

## 2022-02-01 NOTE — TOC Progression Note (Signed)
Transition of Care [facility]) - Progression Note    Patient Details  Name: Amber Cross MRN: [id number] Date of Birth: November 24, 1960  [facility] CM/SW [place], RN Phone Number: 02/01/2022, 3:31 PM  Clinical Narrative:     [employer] delivered a shower stool, instead of a tub bench. Ordered a tub bench and the shower stool will be taken back via adapt.   Expected Discharge Plan: [place] Barriers to Discharge: Continued Medical Work up  Expected Discharge Plan and Services Expected Discharge Plan: [place]   Discharge Planning Services: CM Consult   Living arrangements for the past 2 months: Single Family Home                 DME Arranged: Tub bench DME Agency: [employer] Date DME Agency Contacted: 02/01/22 Time DME Agency Contacted: [id number] Representative spoke with at DME Agency: Jeanella Anton HH Arranged: PT, OT [place] Agency: Lillie Date [place]: 01/31/22 Time [place]: 1326 Representative spoke with at [place]: Cindie   Social Determinants of Health ([place]) Interventions    Readmission Risk Interventions     No data to display

## 2022-02-01 NOTE — Plan of Care (Signed)

## 2022-02-01 NOTE — Progress Notes (Addendum)
Patient left the floor with family member via wheelchair with no permission from nursing staff. They told the 5W Sec that "we're going to the cafeteria".  She is not on her cardiac monitor, she removed her own IV and it was laying on her bedside table.  IM residents notified.   Patient did return back to room, agreed to tele and PIV insertion for medications.

## 2022-02-01 NOTE — Progress Notes (Signed)
Physical Therapy Treatment Patient Details Name: Amber Cross MRN: [id number] DOB: [birth year] Today's Date: 02/01/2022   History of Present Illness 61 y.o. female presents to [facility] hospital on 01/27/2022 with L knee pain and LLE weakness. Pt underwent L knee arthrocentesis on 7/3. MRI Lumbar spine negative, MRI brain pending. Pt found to be hypokalemic. PMHx of T2DM, HTN, HLD, anxiety, HFpEF, CKD IIIb.    PT Comments    Pt received in supine, agreeable to therapy session with encouragement after recently working with OT and with good participation and tolerance for gait and stair negotiation instruction. Pt needing modA for lift/lowering assist to perform stairs with railing due to BLE weakness. Pt with good use of RW only needing min guard at most for community distance ambulation task using AD. Pt continues to benefit from PT services to progress toward functional mobility goals.    Recommendations for follow up therapy are one component of a multi-disciplinary discharge planning process, led by the attending physician.  Recommendations may be updated based on patient status, additional functional criteria and insurance authorization.  Follow Up Recommendations  Home health PT     Assistance Recommended at Discharge Frequent or constant Supervision/Assistance  Patient can return home with the following A little help with walking and/or transfers;A little help with bathing/dressing/bathroom;Assistance with cooking/housework;Assist for transportation;Help with stairs or ramp for entrance   Equipment Recommendations  Rolling walker (2 wheels);Other (comment) (tub bench)    Recommendations for Other Services       Precautions / Restrictions Precautions Precautions: Fall Restrictions Weight Bearing Restrictions: No LLE Weight Bearing: Weight bearing as tolerated     Mobility  Bed Mobility Overal bed mobility: Modified Independent                  Transfers Overall transfer  level: Needs assistance Equipment used: Rolling walker (2 wheels) Transfers: Sit to/from Stand Sit to Stand: Min guard           General transfer comment: cues for safe UE placement, min guard for stand>sit for safety    Ambulation/Gait Ambulation/Gait assistance: Supervision Gait Distance (Feet): 250 Feet Assistive device: Rolling walker (2 wheels) Gait Pattern/deviations: Step-through pattern, Decreased step length - right, Decreased step length - left, Decreased stride length Gait velocity: reduced Gait velocity interpretation: <1.8 ft/sec, indicate of risk for recurrent falls   General Gait Details: decreased knee flexion and small/low steps with light UE reliance on RW, pt needing less physical assist using RW and encouraged her to continue use, especially given L knee pain on admission   Stairs Stairs: Yes Stairs assistance: Mod assist Stair Management: One rail Right, Step to pattern, Forwards Number of Stairs: 2 General stair comments: pt using R rail and LUE (forearm) support, pt needing modA boost assist to ascend leading with RLE, pt with L weakness from CVA but significant proximal RLE weakness as well. ModA to step up and down.     Balance Overall balance assessment: Needs assistance Sitting-balance support: No upper extremity supported, Feet supported Sitting balance-Leahy Scale: Good     Standing balance support: Bilateral upper extremity supported Standing balance-Leahy Scale: Good Standing balance comment: good with RW; poor without RW                            Cognition Arousal/Alertness: Awake/alert Behavior During Therapy: WFL for tasks assessed/performed Overall Cognitive Status: Within Functional Limits for tasks assessed  Exercises      General Comments General comments (skin integrity, edema, etc.): BP 158/68 (96); HR 96; SpO2 95%      Pertinent Vitals/Pain Pain  Assessment Pain Assessment: No/denies pain Pain Intervention(s): Monitored during session           PT Goals (current goals can now be found in the care plan section) Acute Rehab PT Goals Patient Stated Goal: to go home and help with grandchildren PT Goal Formulation: With patient Time For Goal Achievement: 02/12/22 Progress towards PT goals: Progressing toward goals    Frequency    Min 3X/week      PT Plan Current plan remains appropriate       AM-PAC PT "6 Clicks" Mobility   Outcome Measure  Help needed turning from your back to your side while in a flat bed without using bedrails?: A Little Help needed moving from lying on your back to sitting on the side of a flat bed without using bedrails?: A Little Help needed moving to and from a bed to a chair (including a wheelchair)?: A Little Help needed standing up from a chair using your arms (e.g., wheelchair or bedside chair)?: A Little Help needed to walk in hospital room?: A Little Help needed climbing 3-5 steps with a railing? : A Lot 6 Click Score: 17    End of Session Equipment Utilized During Treatment: Gait belt Activity Tolerance: Patient tolerated treatment well Patient left: in chair;with call bell/phone within reach Nurse Communication: Mobility status;Other (comment) (pt up in chair, may need alarm pad, none in room; pt has call bell and instructed to use it) PT Visit Diagnosis: Other abnormalities of gait and mobility (R26.89);Muscle weakness (generalized) (M62.81) Pain - Right/Left: Left Pain - part of body: Knee     Time: 4037-5436 PT Time Calculation (min) (ACUTE ONLY): 24 min  Charges:  $Gait Training: 23-37 mins                     Hendrixx Severin P., PTA Acute Rehabilitation Services Secure Chat Preferred 9a-5:30pm Office: [place] 02/01/2022, 4:56 PM

## 2022-02-01 NOTE — Consult Note (Addendum)
Reason for Consult: Acute kidney injury on chronic kidney disease stage IIIb Referring Physician: Gilles Chiquito, MD ([facility])  HPI:  61 year old woman with past medical history significant for type 2 diabetes mellitus, hypertension, dyslipidemia, history of small cell lung cancer currently undergoing radiation therapy, history of CVA and proteinuric chronic kidney disease stage IIIb.  She has been seen once in May 2023 by Dr. Pearson Grippe at [facility] and failed to follow-up because she was "trying to focus attention on management of her lung cancer".  Baseline creatinine appears to be 1.7-1.9.  She presented to the emergency room with concerns of left lower extremity weakness and reported some associated numbness/tingling on her face as well as upper extremity prompting evaluation for CVA; work-up negative for acute CVA.  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  Concern is raised with worsening renal function with creatinine gradually rising from 2.6-3.1 in the setting of elevated blood pressures and decreased urine output in spite of earlier fluids that were given for suspicion of prerenal azotemia/intravascular contraction.  She reported preceding history of watery diarrhea/fecal incontinence without nausea or vomiting; diarrhea has resolved since hospitalization.  She denies any urinary complaints prior to admission including hematuria and denies using any nonsteroidal anti-inflammatory drug.  She was on losartan prior to admission along with furosemide 60 mg daily.   01/28/22 14:24 01/29/22 01:31 01/30/22 01:07 01/31/22 01:56 02/01/22 00:55  BUN 28 (H) 29 (H) 37 (H) 43 (H) 48 (H)  Creatinine 2.63 (H) 2.86 (H) 2.85 (H) 2.93 (H) 3.11 (H)    Past Medical History:  Diagnosis Date   Anemia    Arthritis    CHF (congestive heart failure) (HCC)    Chronic kidney disease    Depression    Diabetes mellitus    Headache     Hypercholesteremia    Hypertension    Pseudogout    Stroke ([place]) 10/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   Tobacco abuse     Past Surgical History:  Procedure Laterality Date   ANTERIOR CRUCIATE LIGAMENT REPAIR Right    BRONCHIAL BIOPSY  12/18/2021   Procedure: BRONCHIAL BIOPSIES;  Surgeon: Garner Nash, DO;  Location: [facility];  Service: Pulmonary;;   BRONCHIAL BRUSHINGS  12/18/2021   Procedure: BRONCHIAL BRUSHINGS;  Surgeon: Garner Nash, DO;  Location: [facility];  Service: Pulmonary;;   BRONCHIAL NEEDLE ASPIRATION BIOPSY  12/18/2021   Procedure: BRONCHIAL NEEDLE ASPIRATION BIOPSIES;  Surgeon: Garner Nash, DO;  Location: [facility];  Service: Pulmonary;;   CATARACT EXTRACTION     FIDUCIAL MARKER PLACEMENT  12/18/2021   Procedure: FIDUCIAL MARKER PLACEMENT;  Surgeon: Garner Nash, DO;  Location: [facility];  Service: Pulmonary;;   OVARY SURGERY     RIGHT OOPHORECTOMY Right    SMALL INTESTINE SURGERY     TUBAL LIGATION     VIDEO BRONCHOSCOPY WITH RADIAL ENDOBRONCHIAL ULTRASOUND  12/18/2021   Procedure: RADIAL ENDOBRONCHIAL ULTRASOUND;  Surgeon: Garner Nash, DO;  Location: [facility];  Service: Pulmonary;;    Family History  Problem Relation Age of Onset   Diabetes Mother    Hypertension Mother    Hypertension Father     Social History:  reports that she has been smoking cigarettes. She has a 20.00 pack-year smoking history. She has never used smokeless tobacco. She reports that she does not currently use alcohol. She reports current drug use. Frequency: 4.00 times per week. Drug: Marijuana.  Allergies:  Allergies  Allergen Reactions   Penicillins Itching and Other (See Comments)    redness   Strawberry (Diagnostic) Itching and Swelling   Tomato Itching and Swelling    Medications: I have reviewed the patient's current medications. Scheduled:  amLODipine  5 mg Oral Daily   aspirin EC  81 mg Oral Daily   carvedilol  50 mg Oral BID WC   enoxaparin  (LOVENOX) injection  30 mg Subcutaneous Q24H   furosemide  40 mg Intravenous Once   hydrALAZINE  25 mg Oral Q8H   insulin aspart  0-15 Units Subcutaneous TID WC   insulin aspart  5 Units Subcutaneous TID WC   insulin glargine-yfgn  15 Units Subcutaneous BID   predniSONE  20 mg Oral Q breakfast   rosuvastatin  40 mg Oral Daily   Vitamin D (Ergocalciferol)  50,000 Units Oral Q7 days   Continuous:  albumin human        Latest Ref Rng & Units 02/01/2022   12:55 AM 01/31/2022    1:56 AM 01/30/2022    1:07 AM  BMP  Glucose 70 - 99 mg/dL 293  351  374   BUN 6 - 20 mg/dL 48  43  37   Creatinine 0.44 - 1.00 mg/dL 3.11  2.93  2.85   Sodium 135 - 145 mmol/L 135  133  136   Potassium 3.5 - 5.1 mmol/L 3.8  4.2  4.2   Chloride 98 - 111 mmol/L 106  100  103   CO2 22 - 32 mmol/L 21  18  20    Calcium 8.9 - 10.3 mg/dL 7.6  8.1  7.6       Latest Ref Rng & Units 02/01/2022   12:55 AM 01/31/2022    7:40 AM 01/30/2022    1:07 AM  CBC  WBC 4.0 - 10.5 K/uL 4.0 - 10.5 K/uL 14.6    14.4  15.8  11.0   Hemoglobin 12.0 - 15.0 g/dL 12.0 - 15.0 g/dL 8.3    8.5  8.1  8.2   Hematocrit 36.0 - 46.0 % 36.0 - 46.0 % 25.8    25.8  25.0  25.3   Platelets 150 - 400 K/uL 150 - 400 K/uL 418    439  397  370    Urinalysis    Component Value Date/Time   COLORURINE YELLOW 01/30/2022 1520   APPEARANCEUR CLOUDY (A) 01/30/2022 1520   LABSPEC 1.017 01/30/2022 1520   PHURINE 5.0 01/30/2022 1520   GLUCOSEU >=500 (A) 01/30/2022 1520   HGBUR SMALL (A) 01/30/2022 1520   BILIRUBINUR NEGATIVE 01/30/2022 1520   KETONESUR NEGATIVE 01/30/2022 1520   PROTEINUR >=300 (A) 01/30/2022 1520   UROBILINOGEN 0.2 09/29/2011 2007   NITRITE NEGATIVE 01/30/2022 1520   LEUKOCYTESUR NEGATIVE 01/30/2022 1520    US RENAL  Result Date: 01/30/2022 CLINICAL DATA:  Acute kidney injury, chronic kidney disease, diabetes mellitus, hypertension, CHF, stroke, hypercholesterolemia EXAM: RENAL / URINARY TRACT ULTRASOUND COMPLETE COMPARISON:   Ultrasound abdomen 11/21/2021 FINDINGS: Right Kidney: Renal measurements: 11.9 x 5.2 x 6.1 cm = volume: 195 mL. Normal cortical thickness. Increased cortical echogenicity. Small cyst upper pole 14 x 14 x 13 mm, simple features. No additional mass, hydronephrosis, or shadowing calcification. Left Kidney: Renal measurements: 12.2 x 7.0 x 6.0 cm = volume: 268 mL. Normal cortical thickness. Increased cortical echogenicity. No mass, hydronephrosis, or shadowing calcification. Bladder: Appears normal for degree of bladder distention. LEFT ureteral jet visualized. Other: BILATERAL pleural effusions noted.  Minimal ascites. IMPRESSION: Medical  renal disease changes of both kidneys. Small RIGHT renal cyst. No additional renal sonographic abnormalities. BILATERAL pleural effusions and minimal ascites. Electronically Signed   By: Lavonia Dana M.D.   On: 01/30/2022 13:35    Review of Systems  Constitutional:  Positive for fatigue. Negative for appetite change, chills and fever.  HENT:  Negative for sinus pressure, sinus pain, sore throat and trouble swallowing.   Eyes:  Negative for photophobia and visual disturbance.  Respiratory:  Negative for cough, shortness of breath and wheezing.   Cardiovascular:  Positive for leg swelling.  Gastrointestinal:  Negative for diarrhea, nausea and vomiting.  Endocrine: Negative for polydipsia and polyuria.  Genitourinary:  Negative for frequency, hematuria and urgency.  Musculoskeletal:  Positive for arthralgias, gait problem and joint swelling.       Left knee  Skin:  Negative for color change and wound.  Neurological:  Negative for weakness, light-headedness and headaches.   Blood pressure (!) 187/84, pulse 71, temperature 97.8 F (36.6 C), temperature source Oral, resp. rate 17, height 5' 7.5" (1.715 m), weight 53.5 kg, last menstrual period 09/29/2011, SpO2 97 %. Physical Exam Vitals and nursing note reviewed.  Constitutional:      General: She is not in acute  distress.    Appearance: Normal appearance. She is normal weight. She is ill-appearing.  HENT:     Head: Normocephalic and atraumatic.     Right Ear: External ear normal.     Left Ear: External ear normal.     Nose: Nose normal.     Mouth/Throat:     Mouth: Mucous membranes are moist.     Pharynx: Oropharynx is clear.  Eyes:     General: No scleral icterus.    Extraocular Movements: Extraocular movements intact.     Conjunctiva/sclera: Conjunctivae normal.  Cardiovascular:     Rate and Rhythm: Normal rate and regular rhythm.     Pulses: Normal pulses.     Heart sounds: Normal heart sounds.  Pulmonary:     Effort: Pulmonary effort is normal.     Breath sounds: Normal breath sounds. No wheezing or rales.  Abdominal:     General: Abdomen is flat. Bowel sounds are normal.     Palpations: Abdomen is soft.     Tenderness: There is abdominal tenderness.     Comments: Mild right lower quadrant tenderness on deep palpation  Musculoskeletal:        General: Normal range of motion.     Cervical back: Normal range of motion and neck supple.     Right lower leg: Edema present.     Left lower leg: Edema present.     Comments: 1+ bilateral lower extremity edema  Skin:    General: Skin is warm and dry.     Findings: No bruising.  Neurological:     General: No focal deficit present.     Mental Status: She is alert and oriented to person, place, and time.  Psychiatric:        Mood and Affect: Mood normal.        Behavior: Behavior normal.     Assessment/Plan: 1.  Acute kidney injury on chronic kidney disease stage IIIb: This appears to be largely hemodynamically mediated in a patient with what appears to be underlying chronic kidney disease from diabetes/hypertension.  She has had intermittently elevated blood pressures and I suspect now has an element of venous congestion with mild hypervolemia.  Urine electrolytes/renal ultrasound reviewed and found to be nonrevealing  of etiology.  Will  give her albumin/furosemide today to help promote diuresis/augment urine output and continue to follow renal function.  No acute electrolyte abnormalities noted. Avoid nephrotoxic medications including NSAIDs and iodinated intravenous contrast exposure unless the latter is absolutely indicated.  Preferred narcotic agents for pain control are hydromorphone, fentanyl, and methadone. Morphine should not be used. Avoid Baclofen and avoid oral sodium phosphate and magnesium citrate based laxatives / bowel preps. Continue strict Input and Output monitoring. Will monitor the patient closely with you and intervene or adjust therapy as indicated by changes in clinical status/labs. 2.  Left knee arthritis: Status post arthrocentesis revealing diagnosis of CPPD.  Status post intra-articular corticosteroids and getting oral prednisone. 3.  Hypertension: Blood pressures noted to be significantly elevated, restarted back on amlodipine and carvedilol, anticipate additional improvement with diuresis.  Losartan appropriately on hold. 4.  Anemia: Likely secondary to chronic illness, avoid ESA at this time with active malignancy.  Would benefit from checking iron studies to decide on need for replacement.   Samari Bittinger K. 02/01/2022, 10:45 AM

## 2022-02-01 NOTE — Progress Notes (Signed)
Occupational Therapy Treatment Patient Details Name: Ioma Chismar MRN: [id number] DOB: [birth year] Today's Date: 02/01/2022   History of present illness 61 y.o. female presents to [facility] on 01/27/2022 with L knee pain and LLE weakness. Pt underwent L knee arthrocentesis on 7/3. MRI Lumbar spine negative, MRI brain pending. Pt found to be hypokalemic. PMHx of T2DM, HTN, HLD, anxiety, HFpEF, CKD IIIb.   OT comments  Pt completed transfer to the toilet and sink for grooming tasks with use of the RW and supervision.  Attempted simulated tub transfer, however pt unable to safely step over edge of tub and use shower seat that was delivered.  Recommend tub bench for home with case manager being notified to have them switch it out.  Will continue to follow in acute care for OT needs at this time,     Recommendations for follow up therapy are one component of a multi-disciplinary discharge planning process, led by the attending physician.  Recommendations may be updated based on patient status, additional functional criteria and insurance authorization.    Follow Up Recommendations  Home health OT    Assistance Recommended at Discharge PRN  Patient can return home with the following  A little help with walking and/or transfers;A little help with bathing/dressing/bathroom;Assistance with cooking/housework;Assist for transportation;Help with stairs or ramp for entrance   Equipment Recommendations  BSC/3in1;Tub/shower bench       Precautions / Restrictions Precautions Precautions: Fall Restrictions Weight Bearing Restrictions: No LLE Weight Bearing: Weight bearing as tolerated       Mobility Bed Mobility Overal bed mobility: Modified Independent                  Transfers Overall transfer level: Needs assistance Equipment used: Rolling walker (2 wheels) Transfers: Sit to/from Stand Sit to Stand: Supervision     Step pivot transfers: Supervision     General transfer  comment: She was able to ambulate with the RW for support to the sink with supervison and for attempt at simulated walk-in shower transfer.     Balance Overall balance assessment: Needs assistance Sitting-balance support: No upper extremity supported, Feet supported Sitting balance-Leahy Scale: Good     Standing balance support: No upper extremity supported, During functional activity Standing balance-Leahy Scale: Fair Standing balance comment: Pt able to stand at the sink without UE support to complete grooming tasks                           ADL either performed or assessed with clinical judgement   ADL Overall ADL's : Needs assistance/impaired     Grooming: Oral care;Wash/dry face;Wash/dry hands;Sitting                   Toilet Transfer: Supervision/safety;BSC/3in1;Rolling walker (2 wheels)   Toileting- Water quality scientist and Hygiene: Supervision/safety;Sit to/from stand   Tub/ Banker: Moderate assistance;Shower seat;Ambulation;Rolling walker (2 wheels) Tub/Shower Transfer Details (indicate cue type and reason): simulated for attempt to step over a simulated edge. Functional mobility during ADLs: Supervision/safety;Rolling walker (2 wheels) General ADL Comments: Pt's LLE much improved pain wise from last visit.  She reported no pain when asked by therapist.  Decreased ability to complete simulated step over the edge of the tub so continued to recommend tub bench for home.  Called case manager to switch out seat in the room and get patient a tub bench, which was ordered.  Cognition Arousal/Alertness: Awake/alert Behavior During Therapy: WFL for tasks assessed/performed Overall Cognitive Status: Within Functional Limits for tasks assessed                                                     Pertinent Vitals/ Pain       Pain Assessment Pain Assessment: No/denies pain         Frequency  Min 2X/week         Progress Toward Goals  OT Goals(current goals can now be found in the care plan section)     Acute Rehab OT Goals Patient Stated Goal: Pt hopes to go home soon. OT Goal Formulation: With patient Time For Goal Achievement: 02/12/22 Potential to Achieve Goals: Good         AM-PAC OT "6 Clicks" Daily Activity     Outcome Measure   Help from another person eating meals?: None Help from another person taking care of personal grooming?: None Help from another person toileting, which includes using toliet, bedpan, or urinal?: A Little Help from another person bathing (including washing, rinsing, drying)?: A Little Help from another person to put on and taking off regular upper body clothing?: None Help from another person to put on and taking off regular lower body clothing?: A Little 6 Click Score: 21    End of Session Equipment Utilized During Treatment: Gait belt;Rolling walker (2 wheels)  OT Visit Diagnosis: Unsteadiness on feet (R26.81);Muscle weakness (generalized) (M62.81)   Activity Tolerance Patient tolerated treatment well   Patient Left with call bell/phone within reach;in bed   Nurse Communication Mobility status        Time: 5027-7412 OT Time Calculation (min): 26 min  Charges: OT General Charges $OT Visit: 1 Visit OT Treatments $Self Care/Home Management : 23-37 mins  Neftali Thurow OTR/L 02/01/2022, 4:22 PM

## 2022-02-02 DIAGNOSIS — R29898 Other symptoms and signs involving the musculoskeletal system: Secondary | ICD-10-CM | POA: Diagnosis not present

## 2022-02-02 LAB — GLUCOSE, CAPILLARY
Glucose-Capillary: 239 mg/dL — ABNORMAL HIGH (ref 70–99)
Glucose-Capillary: 196 mg/dL — ABNORMAL HIGH (ref 70–99)
Glucose-Capillary: 158 mg/dL — ABNORMAL HIGH (ref 70–99)
Glucose-Capillary: 182 mg/dL — ABNORMAL HIGH (ref 70–99)

## 2022-02-02 LAB — RENAL FUNCTION PANEL
Albumin: 1.6 g/dL — ABNORMAL LOW (ref 3.5–5.0)
Anion gap: 6 (ref 5–15)
BUN: 62 mg/dL — ABNORMAL HIGH (ref 6–20)
CO2: 21 mmol/L — ABNORMAL LOW (ref 22–32)
Calcium: 7.3 mg/dL — ABNORMAL LOW (ref 8.9–10.3)
Chloride: 107 mmol/L (ref 98–111)
Creatinine, Ser: 3.32 mg/dL — ABNORMAL HIGH (ref 0.44–1.00)
GFR, Estimated: 15 mL/min — ABNORMAL LOW (ref >60)
Glucose, Bld: 247 mg/dL — ABNORMAL HIGH (ref 70–99)
Phosphorus: 4.3 mg/dL (ref 2.5–4.6)
Potassium: 3.8 mmol/L (ref 3.5–5.1)
Sodium: 134 mmol/L — ABNORMAL LOW (ref 135–145)

## 2022-02-02 MED ORDER — SODIUM CHLORIDE 0.9 % IV SOLN
125.0000 mg | Freq: Once | INTRAVENOUS | Status: AC
  Administered 2022-02-02: 125 mg via INTRAVENOUS
  Filled 2022-02-02: qty 10

## 2022-02-02 MED ORDER — LOSARTAN POTASSIUM 50 MG PO TABS: 25.0000 mg | ORAL_TABLET | Freq: Every day | ORAL | Status: DC

## 2022-02-02 MED ORDER — HYDRALAZINE HCL 50 MG PO TABS
50.0000 mg | ORAL_TABLET | Freq: Three times a day (TID) | ORAL | Status: DC
  Administered 2022-02-02 – 2022-02-03 (×3): 50 mg via ORAL
  Filled 2022-02-02 (×3): qty 1

## 2022-02-02 MED ORDER — ALBUMIN HUMAN 25 % IV SOLN
25.0000 g | Freq: Once | INTRAVENOUS | Status: AC
Start: 2022-02-02 — End: 2022-02-02
  Administered 2022-02-02: 25 g via INTRAVENOUS
  Filled 2022-02-02: qty 100

## 2022-02-02 MED ORDER — AMLODIPINE BESYLATE 10 MG PO TABS
10.0000 mg | ORAL_TABLET | Freq: Every day | ORAL | Status: DC
  Administered 2022-02-02 – 2022-02-03 (×2): 10 mg via ORAL
  Filled 2022-02-02 (×2): qty 1

## 2022-02-02 MED ORDER — FUROSEMIDE 10 MG/ML IJ SOLN
40.0000 mg | Freq: Once | INTRAMUSCULAR | Status: AC
  Filled 2022-02-02: qty 4

## 2022-02-02 MED ORDER — HYDROXYZINE HCL 25 MG PO TABS
25.0000 mg | ORAL_TABLET | Freq: Every day | ORAL | Status: DC
  Administered 2022-02-02: 25 mg via ORAL
  Filled 2022-02-02: qty 1

## 2022-02-02 NOTE — Plan of Care (Signed)
  Problem: Clinical Measurements: Goal: Will remain free from infection Outcome: Progressing Goal: Diagnostic test results will improve Outcome: Progressing Goal: Cardiovascular complication will be avoided Outcome: Progressing   Problem: Coping: Goal: Level of anxiety will decrease Outcome: Progressing   Problem: Elimination: Goal: Will not experience complications related to urinary retention Outcome: Progressing

## 2022-02-02 NOTE — Progress Notes (Signed)
Patient ID: Amber Cross, female   DOB: 12-Jun-1961, 61 y.o.   MRN: [id number] [facility] Progress Note   Assessment/ Plan:   1.  Acute kidney injury on chronic kidney disease stage IIIb: This appears to be largely hemodynamically mediated in a patient with what appears to be underlying chronic kidney disease from diabetes/hypertension.  Nonoliguric overnight with slight worsening of renal function which I suspect is exacerbated by her hyperglycemia and elevated blood pressures.  Agree with uptitrating antihypertensive therapy and will redose diuretic again today to help augment urine output. 2.  Left knee arthritis: Status post arthrocentesis revealing diagnosis of CPPD.  Status post intra-articular corticosteroids and getting oral prednisone. 3.  Hypertension: Blood pressures noted to be significantly elevated, amlodipine and hydralazine uptitrated by primary service today with continued high-dose carvedilol.  Avoid restarting ARB at this time with worsening renal function.  Nonoliguric overnight,. 4.  Anemia: Likely secondary to chronic illness, avoid ESA at this time with active malignancy.  Iron saturation marginal and will benefit from intravenous iron.  Subjective:   Upset that she cannot go home today, tearful and does not engage in additional conversation.   Objective:   BP (!) 187/80   Pulse 73   Temp 97.8 F (36.6 C) (Oral)   Resp 15   Ht 5' 7.5" (1.715 m)   Wt 53.5 kg   LMP 09/29/2011   SpO2 97%   BMI 18.21 kg/m   Intake/Output Summary (Last 24 hours) at 02/02/2022 3664 Last data filed at 02/02/2022 0516 Gross per 24 hour  Intake --  Output 1400 ml  Net -1400 ml   Weight change:   Physical Exam: Gen: Resting comfortably in bed until informed that I do not recommend that she goes home today. CVS: Pulse regular rhythm, normal rate, S1 and S2 normal Resp: Clear to auscultation bilaterally, no rales/rhonchi Abd: Soft, flat, nontender, bowel sounds  normal Ext: Trace lower extremity edema  Imaging: No results found.  Labs: BMET Recent Labs  Lab 01/28/22 0511 01/28/22 1424 01/29/22 0131 01/30/22 0107 01/31/22 0156 02/01/22 0055 02/02/22 0046  NA 138 139 136 136 133* 135 134*  K 2.3* 2.7* 3.7 4.2 4.2 3.8 3.8  CL 106 104 104 103 100 106 107  CO2 25 25 23  20* 18* 21* 21*  GLUCOSE 268* 249* 168* 374* 351* 293* 247*  BUN 31* 28* 29* 37* 43* 48* 62*  CREATININE 2.57* 2.63* 2.86* 2.85* 2.93* 3.11* 3.32*  CALCIUM 7.2* 7.5* 7.4* 7.6* 8.1* 7.6* 7.3*  PHOS  --   --   --   --   --   --  4.3   CBC Recent Labs  Lab 01/27/22 1620 01/28/22 0511 01/29/22 0131 01/29/22 0747 01/30/22 0107 01/31/22 0740 02/01/22 0055  WBC 11.4* 9.2   < > 9.4 11.0* 15.8* 14.4*  14.6*  NEUTROABS 9.5* 6.8  --   --   --   --  13.0*  HGB 8.2* 7.1*   < > 8.2* 8.2* 8.1* 8.5*  8.3*  HCT 25.0* 21.6*   < > 25.4* 25.3* 25.0* 25.8*  25.8*  MCV 86.8 87.1   < > 87.6 86.3 85.3 86.6  86.0  PLT 375 334   < > 357 370 397 439*  418*   < > = values in this interval not displayed.    Medications:     amLODipine  10 mg Oral Daily   aspirin EC  81 mg Oral Daily   carvedilol  50 mg  Oral BID WC   enoxaparin (LOVENOX) injection  30 mg Subcutaneous Q24H   hydrALAZINE  50 mg Oral Q8H   insulin aspart  0-15 Units Subcutaneous TID WC   insulin aspart  5 Units Subcutaneous TID WC   insulin glargine-yfgn  15 Units Subcutaneous BID   rosuvastatin  40 mg Oral Daily   Vitamin D (Ergocalciferol)  50,000 Units Oral Q7 days    Elmarie Shiley, MD 02/02/2022, 9:37 AM

## 2022-02-02 NOTE — Progress Notes (Signed)
Subjective: Amber Cross is a 61 y.o. with a PMH of poorly controlled T2DM, HTN, small cell lung cancer currently undergoing radiation therapy, and CKD 3 who initially presented with left knee pain and effusion and was admitted for CPPD arthritis of the left knee and new AKI on CKD. The AKI has progressively worsened throughout this admission.  Overnight events: -- No significant reported overnight events -- Blood pressure continues to remain elevated overnight  This morning, the patient is tearful and reports that she cried a lot earlier because she was told she should not leave the hospital due to her kidneys and hypertension. She reports that she has hardly slept in the last 4 days (except for occasional naps). She also says she is feeling "her nerves" and her daughter agrees over the phone, asking if there is anything we can do for the patient's stress. When asked about her left knee, the patient says the pain has improved and she is able to move the knee much better. The patient notes that she has been having 8/10 abdominal pain and points to the LRQ, URQ, and epigastrium as the source. She denies fevers, chills, nausea, and vomiting. She also denies headaches, vision changes, and dizziness.  Objective:  Vital signs in last 24 hours: Vitals:   02/01/22 1704 02/01/22 2000 02/02/22 0003 02/02/22 0402  BP: (!) 175/77 (!) 181/72 (!) 199/82 (!) 197/88  Pulse: 73  73 73  Resp: 16 (!) 22 20 20   Temp: 98 F (36.7 C) 98.7 F (37.1 C) 97.8 F (36.6 C) 97.8 F (36.6 C)  TempSrc: Oral Oral Oral Oral  SpO2:  97% 97%   Weight:      Height:        Intake/Output Summary (Last 24 hours) at 02/02/2022 5465 Last data filed at 02/02/2022 0516 Gross per 24 hour  Intake --  Output 1700 ml  Net -1700 ml   General:  Female resting in bed, appears tired and mildly tearful. In no acute distress. Cardiovascular:  RRR, no murmurs/rubs/gallops. Normal S1/S2. 2+ radial and pedal pulses  bilaterally. Respiratory:  Normal WOB on room air. Lungs CTAB. Abdominal:  R CVA tenderness. Normoactive bowel sounds. No abdominal tenderness to palpation, rebound, or guarding. Extremities:  2+ pitting edema up to knees bilaterally. Mildly swollen left knee without erythema. Mild tenderness to palpation over left knee. Skin:  Warm and pink. No cyanosis. Psych:  Pleasant and appropriate.  Labs    Latest Ref Rng & Units 02/01/2022   12:55 AM 01/31/2022    7:40 AM 01/30/2022    1:07 AM  CBC  WBC 4.0 - 10.5 K/uL 4.0 - 10.5 K/uL 14.6    14.4  15.8  11.0   Hemoglobin 12.0 - 15.0 g/dL 12.0 - 15.0 g/dL 8.3    8.5  8.1  8.2   Hematocrit 36.0 - 46.0 % 36.0 - 46.0 % 25.8    25.8  25.0  25.3   Platelets 150 - 400 K/uL 150 - 400 K/uL 418    439  397  370     CMP     Component Value Date/Time   NA 134 (L) 02/02/2022 0046   NA 140 11/29/2021 0948   K 3.8 02/02/2022 0046   CL 107 02/02/2022 0046   CO2 21 (L) 02/02/2022 0046   GLUCOSE 247 (H) 02/02/2022 0046   BUN 62 (H) 02/02/2022 0046   BUN 29 (H) 11/29/2021 0948   CREATININE 3.32 (H) 02/02/2022 0046   CALCIUM  7.3 (L) 02/02/2022 0046   PROT 5.4 (L) 01/31/2022 0156   ALBUMIN 1.6 (L) 02/02/2022 0046   AST 13 (L) 01/31/2022 0156   ALT 12 01/31/2022 0156   ALKPHOS 118 01/31/2022 0156   BILITOT 0.3 01/31/2022 0156   GFRNONAA 15 (L) 02/02/2022 0046   GFRAA 120 04/06/2019 1506   Lab Results  Component Value Date   CREATININE 3.32 (H) 02/02/2022   CREATININE 3.11 (H) 02/01/2022   CREATININE 2.93 (H) 01/31/2022  Baseline creatinine appears to be 1.7-1.9.   CBG (last 3)  Recent Labs    02/01/22 1153 02/01/22 1703 02/01/22 2147  GLUCAP 189* 190* 279*    Imaging: No new studies.  Assessment/Plan:  Principal Problem:   Weakness of left lower extremity Active Problems:   Type 2 diabetes mellitus with peripheral neuropathy (HCC)   Essential hypertension   Tobacco use   Stage 3b chronic kidney disease (CKD) (HCC)   Left sided  numbness   Calcium pyrophosphate deposition disease (CPPD)   Pseudogout of knee, left  Amber Cross is a 61 y.o. with a PMH of poorly controlled T2DM, HTN, small cell lung cancer currently undergoing radiation therapy, and CKD 3 admitted for CPPD arthritis and subsequently treated for progressively worsening AKI and HTN during hospitalization.  AKI on CKD 3 Nephrotic range proteinuria Today, the patient's creatinine continues to increase and is now 3.32. Nephrology advises that her worsening AKI appears to be hemodynamically mediated and exacerbated by hyperglycemia and HTN. Thus, controlling her HTN and blood glucose should lead to improved kidney function. Diuresis yesterday has not yet yielded kidney function improvement, but treatment will be repeated today. The patient possibly continues to lose proteins in her urine, creating an unfavorable venous osmotic pressure gradient and preventing effective diuresis. A possible AKI etiology related to hemodynamic mediation is renal artery stenosis, which should be considered given the patient's refractory hypertension, progressive Cr increase, and concurrently rising BUN. Renal ultrasound was obtained on admission without significant findings, but it is prudent to obtain a doppler renal artery ultrasound to rule out this diagnosis. - Follow-up light chains and SPEP -- Renal artery duplex US - Nephrology following, appreciate their continued recommendations: -- Follow-up renal function tomorrow after a second dose of IV Lasix and albumin -- Uptitrate anti-HTN medications to augment urine output - Avoid nephrotoxic medications including NSAIDs and contrast - Strict I&Os -- Plan for outpatient kidney biopsy following discharge  Hypertension Continues to be uncontrolled during this admission and rising. - Increase hydralazine 25 mg PO every 8 hours to 50 mg - Increase amlodipine 5 mg PO daily to 10 mg - Continue Coreg 50 mg oral twice daily -  Continue holding home losartan in setting of AKI per nephrology  Type 2 diabetes Blood glucose gradually trending down, but still above 180 on both CBG and CMP. - Continue insulin glargine 15 units subcutaneous twice daily - Continue insulin aspart 5 units subcutaneous scheduled 3 times daily with meals - SSI  CPPD arthritis of left knee The patient is s/p arthrocentesis and intra-articular steroids. She finished her oral prednisone 4-day course 7/7 and it has been discontinued. Her knee is mildly swollen, but she is reporting improved pain and it does not seem to be causing her significant distress. Therefore, another arthrocentesis is not indicated today.  Hyperlipidemia -- Continue home rosuvastatin 40 mg PO daily  Anemia Patient has baseline anemia, likely secondary to chronic illness. Iron studies showed decreased TIBC. -- Avoid ESA at this time with  active malignancy -- Ferric gluconate 125 mg in 0.9% NS IV x1, per nephrology -- Follow up morning CBC  Insomnia Given the patient's reports of sleep difficulties and anxiety as well as her tearful affect today, she would benefit from better regulation of her circadian rhythm. -- Continue to encourage daytime open blinds and nighttime darkness in room -- Hydroxyzine 25 mg PRN at night   Diet: Carb-Modified IVF: None, None VTE: Enoxaparin Code: Full PT/OT recs: Home Health, other rolling walker Family Update: Daughter on the phone in patient room during rounds    LOS: 4 days   Shitarev, Dimitry, Medical Student 02/02/2022, 6:32 AM  Pager number: [phone]   Attestation for Student Documentation:  I personally was present and performed or re-performed the history, physical exam and medical decision-making activities of this service and have verified that the service and findings are accurately documented in the student's note.  Nani Gasser, MD 02/02/2022, 2:35 PM

## 2022-02-02 NOTE — Progress Notes (Signed)
  Date: 02/02/2022  Patient name: Amber Cross  Medical record number: [id number]  Date of birth: [birth year]   This patient's plan of care was discussed with the house staff. Please see Dr. Hipolito Bayley note for complete details. I concur with his findings.   Sid Falcon, MD 02/02/2022, 8:00 PM

## 2022-02-02 NOTE — Progress Notes (Signed)
Physical Therapy Treatment Patient Details Name: Amber Cross MRN: [id number] DOB: 05/08/1961 Today's Date: 02/02/2022   History of Present Illness 61 y.o. female presents to [facility] hospital on 01/27/2022 with L knee pain and LLE weakness. Pt underwent L knee arthrocentesis on 7/3. MRI Lumbar spine negative, MRI brain no acute changes.  Pt found to be hypokalemic. PMHx of T2DM, HTN, HLD, anxiety, HFpEF, CKD IIIb.    PT Comments    Patient agreed to practice up/down stairs again today as she needed moderate assist on 7/7. Utilized single step with bed rail as railing with pt performing 3 reps with min assist. Patient was able to recall correct sequencing with LEs for ascending and descending.     Recommendations for follow up therapy are one component of a multi-disciplinary discharge planning process, led by the attending physician.  Recommendations may be updated based on patient status, additional functional criteria and insurance authorization.  Follow Up Recommendations  Home health PT     Assistance Recommended at Discharge Intermittent Supervision/Assistance  Patient can return home with the following A little help with walking and/or transfers;A little help with bathing/dressing/bathroom;Assistance with cooking/housework;Assist for transportation;Help with stairs or ramp for entrance   Equipment Recommendations  Rolling walker (2 wheels);Other (comment) (tub bench)    Recommendations for Other Services       Precautions / Restrictions Precautions Precautions: Fall Restrictions Weight Bearing Restrictions: No LLE Weight Bearing: Weight bearing as tolerated     Mobility  Bed Mobility Overal bed mobility: Modified Independent Bed Mobility: Supine to Sit, Sit to Supine     Supine to sit: Modified independent (Device/Increase time) Sit to supine: Modified independent (Device/Increase time)   General bed mobility comments: effortful movement of legs back into bed.     Transfers Overall transfer level: Needs assistance Equipment used: Rolling walker (2 wheels) Transfers: Sit to/from Stand Sit to Stand: Min guard           General transfer comment: cues for safe UE placement, min guard for stand>sit for safety    Ambulation/Gait Ambulation/Gait assistance: Supervision Gait Distance (Feet): 350 Feet Assistive device: Rolling walker (2 wheels) Gait Pattern/deviations: Step-through pattern, Decreased step length - right, Decreased step length - left, Decreased stride length Gait velocity: reduced     General Gait Details: vc for rolling RW instead of lifting it; vc for proximity to [employer] assistance: Min assist Stair Management: Step to pattern, Forwards, One rail Left Number of Stairs: 3 (1 step x 3 reps with bed rail simulating stair rail) General stair comments: pt using L rail and RUE hand-held assist, recalled correct sequencing   Wheelchair Mobility    Modified Rankin (Stroke Patients Only)       Balance Overall balance assessment: Needs assistance Sitting-balance support: No upper extremity supported, Feet supported Sitting balance-Leahy Scale: Good   Postural control: Posterior lean Standing balance support: Bilateral upper extremity supported Standing balance-Leahy Scale: Fair                              Cognition Arousal/Alertness: Awake/alert Behavior During Therapy: WFL for tasks assessed/performed Overall Cognitive Status: Within Functional Limits for tasks assessed                                          Exercises  General Comments        Pertinent Vitals/Pain Pain Assessment Pain Assessment: No/denies pain Faces Pain Scale: No hurt    Home Living                          Prior Function            PT Goals (current goals can now be found in the care plan section) Acute Rehab PT Goals Patient Stated Goal: to go home and help with  grandchildren PT Goal Formulation: With patient Time For Goal Achievement: 02/12/22 Potential to Achieve Goals: Fair Progress towards PT goals: Progressing toward goals    Frequency    Min 3X/week      PT Plan Current plan remains appropriate    Co-evaluation              AM-PAC PT "6 Clicks" Mobility   Outcome Measure  Help needed turning from your back to your side while in a flat bed without using bedrails?: None Help needed moving from lying on your back to sitting on the side of a flat bed without using bedrails?: None Help needed moving to and from a bed to a chair (including a wheelchair)?: A Little Help needed standing up from a chair using your arms (e.g., wheelchair or bedside chair)?: A Little Help needed to walk in hospital room?: A Little Help needed climbing 3-5 steps with a railing? : A Little 6 Click Score: 20    End of Session Equipment Utilized During Treatment: Gait belt Activity Tolerance: Patient tolerated treatment well Patient left: with call bell/phone within reach;in bed   PT Visit Diagnosis: Other abnormalities of gait and mobility (R26.89);Muscle weakness (generalized) (M62.81) Pain - Right/Left: Left Pain - part of body: Knee     Time: 1511-1530 PT Time Calculation (min) (ACUTE ONLY): 19 min  Charges:  $Gait Training: 8-22 mins                      [place]  Office [phone]    Rexanne Mano 02/02/2022, 3:44 PM

## 2022-02-03 ENCOUNTER — Other Ambulatory Visit: Payer: Self-pay | Admitting: Student

## 2022-02-03 ENCOUNTER — Inpatient Hospital Stay (HOSPITAL_COMMUNITY): Payer: 59

## 2022-02-03 DIAGNOSIS — I160 Hypertensive urgency: Secondary | ICD-10-CM

## 2022-02-03 DIAGNOSIS — N1832 Chronic kidney disease, stage 3b: Secondary | ICD-10-CM

## 2022-02-03 LAB — RENAL FUNCTION PANEL
Albumin: 1.8 g/dL — ABNORMAL LOW (ref 3.5–5.0)
Anion gap: 9 (ref 5–15)
BUN: 70 mg/dL — ABNORMAL HIGH (ref 6–20)
CO2: 21 mmol/L — ABNORMAL LOW (ref 22–32)
Calcium: 7.5 mg/dL — ABNORMAL LOW (ref 8.9–10.3)
Chloride: 107 mmol/L (ref 98–111)
Creatinine, Ser: 3.66 mg/dL — ABNORMAL HIGH (ref 0.44–1.00)
GFR, Estimated: 14 mL/min — ABNORMAL LOW (ref >60)
Glucose, Bld: 126 mg/dL — ABNORMAL HIGH (ref 70–99)
Phosphorus: 4.9 mg/dL — ABNORMAL HIGH (ref 2.5–4.6)
Potassium: 4.2 mmol/L (ref 3.5–5.1)
Sodium: 137 mmol/L (ref 135–145)

## 2022-02-03 LAB — CBC
HCT: 26.4 % — ABNORMAL LOW (ref 36.0–46.0)
Hemoglobin: 8.7 g/dL — ABNORMAL LOW (ref 12.0–15.0)
MCH: 28.1 pg (ref 26.0–34.0)
MCHC: 33 g/dL (ref 30.0–36.0)
MCV: 85.2 fL (ref 80.0–100.0)
Platelets: 429 10*3/uL — ABNORMAL HIGH (ref 150–400)
RBC: 3.1 MIL/uL — ABNORMAL LOW (ref 3.87–5.11)
RDW: 14.9 % (ref 11.5–15.5)
WBC: 12.5 10*3/uL — ABNORMAL HIGH (ref 4.0–10.5)
nRBC: 0 % (ref 0.0–0.2)

## 2022-02-03 LAB — GLUCOSE, CAPILLARY
Glucose-Capillary: 99 mg/dL (ref 70–99)
Glucose-Capillary: 214 mg/dL — ABNORMAL HIGH (ref 70–99)

## 2022-02-03 MED ORDER — HYDROXYZINE HCL 25 MG PO TABS: 25.0000 mg | ORAL_TABLET | Freq: Every day | ORAL | 0 refills | Status: DC

## 2022-02-03 MED ORDER — OXYCODONE HCL 5 MG PO TABS: 5.0000 mg | ORAL_TABLET | Freq: Three times a day (TID) | ORAL | 0 refills | Status: AC | PRN

## 2022-02-03 MED ORDER — NOVOLIN 70/30 FLEXPEN RELION (70-30) 100 UNIT/ML ~~LOC~~ SUPN: 15.0000 [IU] | PEN_INJECTOR | Freq: Two times a day (BID) | SUBCUTANEOUS | 2 refills | Status: DC

## 2022-02-03 MED ORDER — FUROSEMIDE 40 MG PO TABS: 40.0000 mg | ORAL_TABLET | Freq: Every day | ORAL | 2 refills | Status: DC

## 2022-02-03 MED ORDER — AMLODIPINE BESYLATE 10 MG PO TABS: 10.0000 mg | ORAL_TABLET | Freq: Every day | ORAL | 2 refills | Status: DC

## 2022-02-03 MED ORDER — OXYCODONE HCL 5 MG PO TABS: 5.0000 mg | ORAL_TABLET | Freq: Three times a day (TID) | ORAL | 0 refills | Status: DC | PRN

## 2022-02-03 MED ORDER — HYDRALAZINE HCL 50 MG PO TABS: 50.0000 mg | ORAL_TABLET | Freq: Three times a day (TID) | ORAL | 5 refills | Status: DC

## 2022-02-03 MED ORDER — VITAMIN D (ERGOCALCIFEROL) 1.25 MG (50000 UNIT) PO CAPS: 50000.0000 [IU] | ORAL_CAPSULE | ORAL | 0 refills | Status: DC

## 2022-02-03 NOTE — Progress Notes (Signed)
Pt upset and irritated because nobody came and answered her call light so she was incont and took herself to the BR. Pt then took herself back to bed because nobody answered her BR call light. I explained that I didn't see or hear her call lights going off and pt stated that's because nobody came fast enough I turned the light off myself and got back in bed and called out again. Linens changed and pt given a shower. Bed alarm set after shower.

## 2022-02-03 NOTE — Progress Notes (Signed)
Was in rm at 2336 to retake BP. Pt sitting up in bed singing to music on phone. Asked pt if she needed anything and she stated "No, I'm ok". Atarax given at 2100 for sleep not effective. Pt then called out at 2356 and stated "I've called out and have been waiting a hot minute for some crackers." Call light didn't go off until this time and pt stated she didn't need anything when I was in the rm at 2336. Pt given crackers per request

## 2022-02-03 NOTE — Progress Notes (Addendum)
IMTS Intern Dr. Ferne Coe paged r/t pt was supposed to be NPO after MN for a Renal Duplex study and she had crackers and peanut butter at 0145

## 2022-02-03 NOTE — Discharge Summary (Signed)
Name: Amber Cross MRN: [id number] DOB: Nov 13, 1960 61 y.o. PCP: [name], DO  Date of Admission: 01/27/2022  4:07 PM Date of Discharge:   02/03/2022 Attending Physician: Dr. Daryll Drown  Discharge Diagnosis: Principal Problem:   Acute on chronic renal failure [facility]) Active Problems:   Type 2 diabetes mellitus with peripheral neuropathy (HCC)   Essential hypertension   Tobacco use   Stage 3b chronic kidney disease (CKD) (HCC)   Left sided numbness   Weakness of left lower extremity   Calcium pyrophosphate deposition disease (CPPD)   Pseudogout of knee, left    Discharge Medications: Allergies as of 02/03/2022       Reactions   Penicillins Itching, Other (See Comments)   redness   Strawberry (diagnostic) Itching, Swelling   Tomato Itching, Swelling        Medication List     STOP taking these medications    oxyCODONE-acetaminophen 5-325 MG tablet Commonly known as: PERCOCET/ROXICET   Vitamin D 50 MCG (2000 UT) tablet       TAKE these medications    acetaminophen 500 MG tablet Commonly known as: TYLENOL Take 1,000 mg by mouth every 6 (six) hours as needed for mild pain.   albuterol 108 (90 Base) MCG/ACT inhaler Commonly known as: VENTOLIN HFA Inhale 2 puffs into the lungs every 6 (six) hours as needed for wheezing or shortness of breath.   amLODipine 10 MG tablet Commonly known as: NORVASC Take 1 tablet (10 mg total) by mouth daily. Start taking on: February 04, 2022 What changed:  medication strength how much to take   aspirin EC 81 MG tablet Take 1 tablet (81 mg total) by mouth daily. Swallow whole.   Bayer Microlet Lancets lancets Check blood sugar up to 3 times a day as instructed   carvedilol 25 MG tablet Commonly known as: COREG Take 50 mg by mouth 2 (two) times daily with a meal.   DULoxetine 60 MG capsule Commonly known as: CYMBALTA Take 1 capsule (60 mg total) by mouth daily.   furosemide 40 MG tablet Commonly known as: LASIX Take 1  tablet (40 mg total) by mouth daily. What changed: how much to take   gabapentin 300 MG capsule Commonly known as: Neurontin Take 1 capsule (300 mg total) by mouth daily. What changed: when to take this   glucose blood test strip Check blood sugar up to 3 times a day as instructed   hydrALAZINE 50 MG tablet Commonly known as: APRESOLINE Take 1 tablet (50 mg total) by mouth every 8 (eight) hours.   hydrOXYzine 25 MG tablet Commonly known as: ATARAX Take 1 tablet (25 mg total) by mouth at bedtime.   losartan 100 MG tablet Commonly known as: COZAAR Take 100 mg by mouth daily.   Multi-Vitamin tablet Take 1 tablet by mouth daily.   nitroGLYCERIN 0.4 MG SL tablet Commonly known as: NITROSTAT Place 0.4 mg under the tongue every 5 (five) minutes as needed for chest pain.   NovoLIN 70/30 Kwikpen (70-30) 100 UNIT/ML KwikPen Generic drug: insulin isophane & regular human KwikPen Inject 15 Units into the skin 2 (two) times daily before lunch and supper. What changed: how much to take   oxyCODONE 5 MG immediate release tablet Commonly known as: Oxy IR/ROXICODONE Take 1 tablet (5 mg total) by mouth every 8 (eight) hours as needed for up to 5 days for moderate pain or severe pain.   pantoprazole 40 MG tablet Commonly known as: Protonix Take 1 tablet (40 mg  total) by mouth daily.   rosuvastatin 40 MG tablet Commonly known as: CRESTOR Take 1 tablet (40 mg total) by mouth daily.   sucralfate 1 g tablet Commonly known as: CARAFATE Take 1 tablet (1 g total) by mouth 4 (four) times daily -  with meals and at bedtime. What changed: when to take this   Unifine Pentips 32G X 4 MM Misc Generic drug: Insulin Pen Needle Use in the morning and at bedtime   Vitamin D (Ergocalciferol) 1.25 MG (50000 UNIT) Caps capsule Commonly known as: DRISDOL Take 1 capsule (50,000 Units total) by mouth every 7 (seven) days. Start taking on: February 07, 2022               Durable Medical  Equipment  (From admission, onward)           Start     Ordered   02/01/22 1528  For home use only DME Tub bench  Once        02/01/22 1527   01/31/22 1345  For home use only DME Walker rolling  Once       Question Answer Comment  Walker: With 5 Inch Wheels   Patient needs a walker to treat with the following condition Right knee pain      01/31/22 1345            Disposition and follow-up:   Ms.Marshe Hawkey was discharged from [facility] in Stable condition.  At the hospital follow up visit please address:  1.  Follow-up:  a.  AKI on CKD3B: Patient's hospital course was delayed due to worsening of her kidney function. At discharge, creatinine was 3.66 from baseline around 1.7. Ensure she followed up for her lab only visit for repeat BMP. Repeat BMP at this visit and ensure she has appointment with [facility] in 1 week.    b.  Pseudogout: Patient was diagnosed at [facility] ED after arthrocentesis revealed calcium pyrophosphate crystals. She had repeat  steroid injection and repeat arthrocentesis during hospitalization with improvement in her symptoms. She was discharged home on a few days of oxycodone. Reassess knee pain and swelling. Consider repeat steroid injection at your discretion   c.  Hypertension: Patient's blood pressure continued to be elevated throughout hospitalization. She was started on hydralazine 50 mg TID and Lasix 40 mg daily and amlodipine was increased to 10 mg daily. Recheck blood pressure and adjust medications as appropriate.   d.  Type 2 diabetes: Repeat A1c was stable at 9.7%. Blood sugar remains elevated throughout hospitalization.  She was discharged home on increased dose of insulin 70/30, 15 units twice daily with meals.   2.  Labs / imaging needed at time of follow-up: BMP, CBC  3.  Pending labs/ test needing follow-up: Kappa/lambda light chain, protein electrophoresis  Follow-up Appointments:  Follow-up Information      [facility] Follow up.   Why: They will call 24 -48 hours post discharge to schedule home visits Contact information: [place] [address] [address], [place], [place]. Go on 02/04/2022.   Specialty: Internal Medicine Why: Go to the clinic for lab only visit. The staff will call you to schedule a hospital follow-up later during the week. Contact information: [place] [address] [phone]         Berniece Salines, DO .   Specialty: Cardiology Contact information: [address] [place]  [place] Hospital Course by problem list: Aanya Haynes is a 61 y.o. with a PMH of poorly controlled T2DM, HTN, small cell lung cancer currently undergoing radiation therapy, acute CVA, and CKD 3 who initially presented with left knee pain with effusion and left leg weakness. She was admitted and worked up for CVA, which was negative. Arthrocentesis revealed CPPD arthritis of the left knee. She also developed acute AKI on CKD. The AKI progressively worsened throughout this admission.  AKI on CKD 3 Nephrotic range proteinuria The patient's serum creatinine was elevated to 2.63 w/ GFR of 20 on admission, with baseline suspected to be Cr 1.7 and GFR 30. She was started on LR IV and urine culture was obtained, which showed E. Coli colonization, but patient remained asymptomatic with no indication for antibiotic treatment. Renal ultrasound showed medical renal disease changes of both kidneys and a small right renal cyst. Light chains and SPEP studies were collected and were in process at discharge. Nephrology was brought on board 7/6 and advised that worsening AKI appears to be hemodynamically mediated and exacerbated by hyperglycemia and HTN. Diuresis with IV Lasix and albumin was attempted by nephrology twice but creatinine continued to trend up. Renal artery duplex ultrasound was  obtained and was negative for renal artery stenosis. Patient's creatinine peaked at 3.66 on day of discharge. Patient wanted to go home so plan was put in place for close monitoring in the outpatient. She was instructed to get BMP checked in the next day and 2 to 3 days after that in the internal medicine clinic.  CPPD arthritis of left knee In the ED, left knee xray showed a large joint effusion in the background of chondrocalcinosis consistent with underlying calcium pyrophosphate dihydrate crystal deposition disease.  Arthrocentesis was completed in the ED, revealing intracellular calcium pyrophosphate crystals. On 7/3, knee arthrocentesis with intra-articular triamcinolone injection was performed, which the patient tolerated well. Her knee was markedly less swollen and painful with greater ROM s/p arthrocentesis and 5-day course of oral prednisone (7/4-7/8). Prednisone was discontinued 1 day early on 7/7 due to persistently high blood glucose levels in the setting of AKI. She was discharged home on 5 mg oxycodone as needed for pain  LLE weakness Recent punctate infarct Hypokalemia The patient's presentation with a two day history of worsening left lower extremity weakness and persistent numbness/tingling of the left side of her body was concerning for new stroke. She had a history of stroke in 10/2021. She had no saddle anesthesia on exam and was she is severely hypokalemic. Following discussion with neurology, IV electrolyte replacement was begun and MRI of brain and spine was obtained. Neurologic exam remained unchanged throughout admission and MRI showed no acute intracranial abnormalities. Weakness was determined to not have neurological etiology.  Hypertension The patient's home medications carvedilol and amlodipine were restarted in the ED, but losartan was held in the setting of AKI. Patient continued to be hypertensive in the range of 140/60 - 190/80 and hydralazine 25 mg PO every 8 hours was  added. Patient's hypertension proved refractory and amlodipine and hydralazine doses were increased. Blood pressure imp[roved with SBP in the 150s at discharge but still above goal.  She was discharged home on amlodipine 10 mg daily, Coreg 50 mg twice daily, hydralazine 50 mg 3 times daily and Lasix 40 mg daily.   Type 2 diabetes The patient's A1c was 9.7% on admission and  CBGs were elevated in the 200s. She was placed on 10 units long acting QHS and SSI upon admission and blood glucose was well controlled for the first few days of her hospital stay unitl blood glucose began increasing into the 200s again. Her insulin was increased to 15 units long acting twice daily as well as 5 units short acting with meals and SSI. She was discharged on her home insulin 70/30, 15 units twice daily with meals.   Insomnia The patient reported poor sleep and high levels of stress on 7/8 and was prescribed hydroxyzine 25 mg PRN at night along encouragement of daytime open blinds and nighttime darkness in room.  Anemia Patient has baseline anemia with hemoglobin in the 8.0-9.0 range, likely secondary to chronic illness. Iron studies showed decreased TIBC. Patient's hemoglobin was found to be 6.8 overnight on 7/4 and she was administered a blood transfusion. Ferric gluconate was administered on 7/8 and follow-up CBC showed 8.7 at discharge.  Chronic diarrhea Bowel incontinence The patient reported chronic diarrhea with 6+ bowel movements daily since April on admission. TSH and inflammatory markers were obtained. TSH was WNL, ESR was elevated to 110, and CRP was elevated to 7.3. Patient did not have profuse diarrhea during this admission. Plan to follow up outpatient.  Chronic systolic heart failure with improved ejection fraction The patient previously had EF 25% with most recent echo showing 55-60% on 10/2021. She appeared euvolemic with no signs of acute heart failure. Furosemide and losartan were initially held due to  AKI and home carvedilol was maintained. Diuresis with Lasix was initiated later during admission due to worsening AKI.  Hyperlipidemia Patient was continued on home rosuvastatin 40 mg PO daily throughout this admission.   Subjective: Patient was evaluated at bedside laying comfortably in bed.  States she slept fine but will prefer to sleep in the home bed.  She did not eat much due to the food not tasting good.  States she is ready to go home.  Informed patient we will discuss with nephrology for safe plan for discharge and close monitoring of her kidney function.  Discharge exam Discharge Vitals:   BP (!) 152/70 (BP Location: Left Arm)   Pulse 68   Temp 98 F (36.7 C) (Oral)   Resp 13   Ht 5' 7.5" (1.715 m)   Wt 53.5 kg   LMP 09/29/2011   SpO2 99%   BMI 18.21 kg/m  General: Pleasant, well-appearing elderly woman laying in bed. No acute distress. CV: RRR. No murmurs, rubs, or gallops.  2+ BLE pitting edema up to knee Pulmonary: Lungs CTAB. Normal effort. No wheezing or rales. Abdominal: Soft, nontender, nondistended. Normal bowel sounds. MSK: Mild tenderness to the left knee with mild edema but no crepitus or erythema. Skin: Warm and dry. No obvious rash or lesions. Neuro: A&Ox3. Moves all extremities. Normal sensation. No focal deficit. Psych: Normal mood and affect  Pertinent Labs, Studies, and Procedures:     Latest Ref Rng & Units 02/03/2022    2:51 AM 02/01/2022   12:55 AM 01/31/2022    7:40 AM  CBC  WBC 4.0 - 10.5 K/uL 12.5  14.6    14.4  15.8   Hemoglobin 12.0 - 15.0 g/dL 8.7  8.3    8.5  8.1   Hematocrit 36.0 - 46.0 % 26.4  25.8    25.8  25.0   Platelets 150 - 400 K/uL 429  418    439  397  Latest Ref Rng & Units 02/03/2022    2:51 AM 02/02/2022   12:46 AM 02/01/2022   12:55 AM  CMP  Glucose 70 - 99 mg/dL 126  247  293   BUN 6 - 20 mg/dL 70  62  48   Creatinine 0.44 - 1.00 mg/dL 3.66  3.32  3.11   Sodium 135 - 145 mmol/L 137  134  135   Potassium 3.5 - 5.1  mmol/L 4.2  3.8  3.8   Chloride 98 - 111 mmol/L 107  107  106   CO2 22 - 32 mmol/L '21  21  21   ' Calcium 8.9 - 10.3 mg/dL 7.5  7.3  7.6      Discharge Instructions: Ms. Constantin, It was a pleasure taking care of you at [place] were admitted for weakness/pain of your left leg and found to have pseudogout, worsening of your kidney function and your blood pressure. Blood pressure and pain are improving. Your kidney function is still worsening. We want to have close monitoring of your kidney function in the next few days. Please follow the following instructions.  -Come to the internal medicine clinic for lab only visit tomorrow -Start hydralazine 50 mg every 8 hours -Take Lasix 40 mg daily -Increase amlodipine to 10 mg daily -Increase your insulin to 15 units twice daily before lunch and supper -Take oxycodone 5 mg every 8 hours as needed for pain -Take hydroxyzine 25 mg daily at bedtime for sleep -Take vitamin D 50,000 units every 7 days for 5 weeks -the kidney doctor will call you to for follow-up in 1 to 2 weeks  Take care,  Dr. Linwood Dibbles, MD, MPH  Signed: Lacinda Axon, MD 02/03/2022, 1:43 PM   Pager: [phone]

## 2022-02-03 NOTE — Progress Notes (Signed)
Patient instructed to come in for lab only visit on 7/10 to check her BMP.

## 2022-02-03 NOTE — Progress Notes (Signed)
Patient ID: Amber Cross, female   DOB: 08-Feb-1961, 61 y.o.   MRN: [id number] [facility] Progress Note   Assessment/ Plan:   1.  Acute kidney injury on chronic kidney disease stage IIIb: This appears to be largely hemodynamically mediated in a patient with what appears to be underlying chronic kidney disease from diabetes/hypertension.  Nonoliguric overnight with slight worsening of renal function which is not unusual in the setting of her elevated blood pressures and hyperglycemia.  Whereas it would be ideal to monitor in the hospital until we verify renal recovery, she is insistent on leaving due to social reasons and I have discussed with the primary service to arrange for labs to be done tomorrow and then again on Wednesday or Thursday as an outpatient with proposed renal follow-up in the next 1 to 2 weeks.  Recommend discharging her home off of ARB (that can be restarted as an outpatient once she has verifiable renal recovery). 2.  Left knee arthritis: Status post arthrocentesis revealing diagnosis of CPPD.  Status post intra-articular corticosteroids and getting oral prednisone. 3.  Hypertension: Blood pressures noted to be significantly elevated, amlodipine and hydralazine uptitrated by primary service today with continued high-dose carvedilol.  Renal artery Dopplers done today to evaluate for renal artery stenosis. 4.  Anemia: Likely secondary to chronic illness, avoid ESA at this time with active malignancy.  Iron saturation marginal and will benefit from intravenous iron.  Subjective:   Remains disappointed about inability to go home and would like to leave the hospital as quickly as possible.   Objective:   BP (!) 164/72 (BP Location: Left Arm)   Pulse 71   Temp 98.8 F (37.1 C) (Oral)   Resp 18   Ht 5' 7.5" (1.715 m)   Wt 53.5 kg   LMP 09/29/2011   SpO2 100%   BMI 18.21 kg/m   Intake/Output Summary (Last 24 hours) at 02/03/2022 1132 Last data filed at 02/03/2022  0800 Gross per 24 hour  Intake 105.89 ml  Output 1200 ml  Net -1094.11 ml   Weight change:   Physical Exam: Gen: Resting comfortably in bed speaking on cell phone CVS: Pulse regular rhythm, normal rate, S1 and S2 normal Resp: Clear to auscultation bilaterally, no rales/rhonchi Abd: Soft, flat, nontender, bowel sounds normal Ext: Trace lower extremity edema  Imaging: No results found.  Labs: BMET Recent Labs  Lab 01/28/22 1424 01/29/22 0131 01/30/22 0107 01/31/22 0156 02/01/22 0055 02/02/22 0046 02/03/22 0251  NA 139 136 136 133* 135 134* 137  K 2.7* 3.7 4.2 4.2 3.8 3.8 4.2  CL 104 104 103 100 106 107 107  CO2 25 23 20* 18* 21* 21* 21*  GLUCOSE 249* 168* 374* 351* 293* 247* 126*  BUN 28* 29* 37* 43* 48* 62* 70*  CREATININE 2.63* 2.86* 2.85* 2.93* 3.11* 3.32* 3.66*  CALCIUM 7.5* 7.4* 7.6* 8.1* 7.6* 7.3* 7.5*  PHOS  --   --   --   --   --  4.3 4.9*   CBC Recent Labs  Lab 01/27/22 1620 01/28/22 0511 01/29/22 0131 01/30/22 0107 01/31/22 0740 02/01/22 0055 02/03/22 0251  WBC 11.4* 9.2   < > 11.0* 15.8* 14.4*  14.6* 12.5*  NEUTROABS 9.5* 6.8  --   --   --  13.0*  --   HGB 8.2* 7.1*   < > 8.2* 8.1* 8.5*  8.3* 8.7*  HCT 25.0* 21.6*   < > 25.3* 25.0* 25.8*  25.8* 26.4*  MCV 86.8 87.1   < >  86.3 85.3 86.6  86.0 85.2  PLT 375 334   < > 370 397 439*  418* 429*   < > = values in this interval not displayed.    Medications:     amLODipine  10 mg Oral Daily   aspirin EC  81 mg Oral Daily   carvedilol  50 mg Oral BID WC   enoxaparin (LOVENOX) injection  30 mg Subcutaneous Q24H   hydrALAZINE  50 mg Oral Q8H   hydrOXYzine  25 mg Oral QHS   insulin aspart  0-15 Units Subcutaneous TID WC   insulin aspart  5 Units Subcutaneous TID WC   insulin glargine-yfgn  15 Units Subcutaneous BID   rosuvastatin  40 mg Oral Daily   Vitamin D (Ergocalciferol)  50,000 Units Oral Q7 days    Elmarie Shiley, MD 02/03/2022, 11:32 AM

## 2022-02-03 NOTE — Progress Notes (Addendum)
IMTS Resident Dr. Ferne Coe returned call and stated that pt could go ahead and have Renal Study today.

## 2022-02-03 NOTE — Progress Notes (Signed)
Was notified that the pt ate some crackers and peanut butter at 0145. She is scheduled for Renal Duplex today and is NPO. Informed pt's nurse that she can still continue with the study as planned.

## 2022-02-03 NOTE — Progress Notes (Signed)
Renal artery duplex study completed.   Please see CV Proc for preliminary results.   Darlin Coco, RDMS, RVT

## 2022-02-03 NOTE — Discharge Instructions (Addendum)
Ms. Amber Cross, It was a pleasure taking care of you at [place] were admitted for weakness/pain of your left leg and found to have pseudogout, worsening of your kidney function and your blood pressure. Blood pressure and pain are improving. Your kidney function is still worsening. We want to have close monitoring of your kidney function in the next few days. Please follow the following instructions.  -Come to the internal medicine clinic for lab only visit tomorrow -Start hydralazine 50 mg every 8 hours -Take Lasix 40 mg daily -Increase amlodipine to 10 mg daily -Increase your insulin to 15 units twice daily before lunch and supper -Take oxycodone 5 mg every 8 hours as needed for pain -Take hydroxyzine 25 mg daily at bedtime for sleep -Take vitamin D 50,000 units every 7 days for 5 weeks -the kidney doctor will call you to for follow-up in 1 to 2 weeks  Take care,  Dr. Linwood Dibbles, MD, MPH

## 2022-02-03 NOTE — TOC Transition Note (Signed)
Transition of Care [facility]) - CM/SW Discharge Note   Patient Details  Name: Amber Cross MRN: [id number] Date of Birth: 07-31-1960  Transition of Care [facility]) CM/SW Contact:  Pollie Friar, RN Phone Number: 02/03/2022, 2:37 PM   Clinical Narrative:    Pt is discharging home with home health services through [facility]. Information on the AVS.  Pt has tub seat in the room but asked for a tub bench. CM called [employer] that delivered the DME and they do not have any tub benches. CM has updated the bedside RN.  Pt has transportation home today.   Final next level of care: [place] Barriers to Discharge: No Barriers Identified   Patient Goals and CMS Choice Patient states their goals for this hospitalization and ongoing recovery are:: return home CMS Medicare.gov Compare Post Acute Care list provided to:: Patient Choice offered to / list presented to : Patient  Discharge Placement                       Discharge Plan and Services   Discharge Planning Services: CM Consult            DME Arranged: Tub bench DME Agency: [employer] Date DME Agency Contacted: 02/01/22 Time DME Agency Contacted: [id number] Representative spoke with at DME Agency: Jeanella Anton HH Arranged: PT, OT [place] Agency: [place] Date [place]: 01/31/22 Time [place]: 1326 Representative spoke with at [place]: Cindie  Social Determinants of Health ([place]) Interventions     Readmission Risk Interventions     No data to display

## 2022-02-04 LAB — PROTEIN ELECTROPHORESIS, SERUM
A/G Ratio: 0.5 — ABNORMAL LOW (ref 0.7–1.7)
Albumin ELP: 1.8 g/dL — ABNORMAL LOW (ref 2.9–4.4)
Alpha-1-Globulin: 0.4 g/dL (ref 0.0–0.4)
Alpha-2-Globulin: 1.3 g/dL — ABNORMAL HIGH (ref 0.4–1.0)
Beta Globulin: 1 g/dL (ref 0.7–1.3)
Gamma Globulin: 0.8 g/dL (ref 0.4–1.8)
Globulin, Total: 3.5 g/dL (ref 2.2–3.9)
Total Protein ELP: 5.3 g/dL — ABNORMAL LOW (ref 6.0–8.5)

## 2022-02-04 LAB — KAPPA/LAMBDA LIGHT CHAINS
Kappa free light chain: 123.1 mg/L — ABNORMAL HIGH (ref 3.3–19.4)
Kappa, lambda light chain ratio: 3.39 — ABNORMAL HIGH (ref 0.26–1.65)
Lambda free light chains: 36.3 mg/L — ABNORMAL HIGH (ref 5.7–26.3)

## 2022-02-05 ENCOUNTER — Other Ambulatory Visit: Payer: 59

## 2022-02-05 DIAGNOSIS — N1832 Chronic kidney disease, stage 3b: Secondary | ICD-10-CM

## 2022-02-06 LAB — BMP8+ANION GAP
Anion Gap: 15 mmol/L (ref 10.0–18.0)
BUN/Creatinine Ratio: 22 (ref 12–28)
BUN: 71 mg/dL — ABNORMAL HIGH (ref 8–27)
CO2: 18 mmol/L — ABNORMAL LOW (ref 20–29)
Calcium: 7.8 mg/dL — ABNORMAL LOW (ref 8.7–10.3)
Chloride: 107 mmol/L — ABNORMAL HIGH (ref 96–106)
Creatinine, Ser: 3.28 mg/dL — ABNORMAL HIGH (ref 0.57–1.00)
Glucose: 332 mg/dL — ABNORMAL HIGH (ref 70–99)
Potassium: 5 mmol/L (ref 3.5–5.2)
Sodium: 140 mmol/L (ref 134–144)
eGFR: 16 mL/min/{1.73_m2} — ABNORMAL LOW (ref >59)

## 2022-02-07 ENCOUNTER — Ambulatory Visit (INDEPENDENT_AMBULATORY_CARE_PROVIDER_SITE_OTHER): Payer: 59 | Admitting: Student

## 2022-02-07 ENCOUNTER — Encounter: Payer: Self-pay | Admitting: Student

## 2022-02-07 VITALS — BP 172/62 | HR 80 | Temp 98.3°F | Wt 161.0 lb

## 2022-02-07 DIAGNOSIS — E1122 Type 2 diabetes mellitus with diabetic chronic kidney disease: Secondary | ICD-10-CM | POA: Diagnosis not present

## 2022-02-07 DIAGNOSIS — I129 Hypertensive chronic kidney disease with stage 1 through stage 4 chronic kidney disease, or unspecified chronic kidney disease: Secondary | ICD-10-CM

## 2022-02-07 DIAGNOSIS — N1832 Chronic kidney disease, stage 3b: Secondary | ICD-10-CM | POA: Diagnosis not present

## 2022-02-07 DIAGNOSIS — E1142 Type 2 diabetes mellitus with diabetic polyneuropathy: Secondary | ICD-10-CM

## 2022-02-07 DIAGNOSIS — R809 Proteinuria, unspecified: Secondary | ICD-10-CM

## 2022-02-07 DIAGNOSIS — I10 Essential (primary) hypertension: Secondary | ICD-10-CM

## 2022-02-07 MED ORDER — TORSEMIDE 20 MG PO TABS: 40.0000 mg | ORAL_TABLET | Freq: Two times a day (BID) | ORAL | 0 refills | Status: DC

## 2022-02-07 MED ORDER — DEXCOM G7 RECEIVER DEVI
1.0000 | Freq: Every day | 0 refills | Status: DC
Start: 2022-02-07 — End: 2022-03-15

## 2022-02-07 MED ORDER — DEXCOM G7 SENSOR MISC: 1.0000 | 2 refills | Status: DC

## 2022-02-07 NOTE — Patient Instructions (Addendum)
Ms. Amber Cross,  It was nice seeing you in the clinic today.  I am sorry that you are going through a lot in the last few months.  I will change your water pill to Torsemide.  Please take 40 mg (2 tablets) 2 times a day.    Please stop Lasix.  Please continue other blood pressure medication.  Make sure that you are not taking losartan (your blood pressure medication).  Please return next Monday for assessment.  If you experience shortness of breath or chest pain, please go to the ED.  Take care  Dr. Alfonse Spruce

## 2022-02-07 NOTE — Assessment & Plan Note (Addendum)
Patient was hospitalized in June for left knee pseudogout.  She was also found to have acute on chronic renal failure.  Her creatinine on admission was 2.63 with GFR of 20 (baseline creatinine 1.7 with GFR 30).  Renal ultrasound was obtained which showed no obstruction.  Nephrology was consulted and trial diuresis with IV Lasix with plus albumin which was not successful.  Renal artery Doppler was negative for any stenosis.  Creatinine at discharge was 3.66 with GFR of 14.  Repeat BMP 2 days ago showed creatinine of 1.28, GFR 16, potassium 5 BUN 71.  Protein electrophoresis was negative for M spike, not consistent with multiple myeloma.  Kappa/lambda ratio were elevated likely in the setting of her acute left knee pseudogout.  Her proteinuria range is 9.2 g, consistent with nephrotic syndrome.  Suspect secondary to her uncontrolled diabetes.  She was recently diagnosed with small cell lung cancer but did not receive chemotherapy.  Today patient presents with 30 pound weight gain since last hospitalization.  She now has +2 pitting edema bilateral lower extremity up to her knees.  Fortunately she has no respiratory issue and satting well on room air.  Lungs are clear but positive JVD.  She denies shortness of breath or orthopnea.  She reports little urine output with Lasix 40 mg daily.  -Stop Lasix and start torsemide 40 mg twice daily -No indication for oral anticoagulation right now -Recheck BMP today -Follow-up on Monday, 02/11/2022 -We reached out to [facility] for her follow-up appointment -Advised patient to go to the ED if she developed shortness of breath or chest pain

## 2022-02-07 NOTE — Progress Notes (Signed)
CC: Hospital follow-up  HPI:  [name] is a 61 y.o. with past medical history of pretension, type 2 diabetes, CVA, new diagnosis of small cell lung cancer, CKD 3B who presents to the clinic for recent hospitalization for pseudogout and acute on chronic renal failure.  Please see problem based charting for detail  Past Medical History:  Diagnosis Date   Anemia    Arthritis    CHF (congestive heart failure) ([place])    Chronic kidney disease    Depression    Diabetes mellitus    Headache    Hypercholesteremia    Hypertension    Pseudogout    Stroke ([place]) 10/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   Tobacco abuse    Review of Systems:  Per HPI  Physical Exam:  Vitals:   02/07/22 1412  BP: (!) 172/62  Pulse: 80  Temp: 98.3 F (36.8 C)  TempSrc: Oral  SpO2: 99%  Weight: 161 lb (73 kg)   Physical Exam Constitutional:      General: She is not in acute distress.    Appearance: She is not ill-appearing.  HENT:     Head: Normocephalic.  Eyes:     General:        Right eye: No discharge.        Left eye: No discharge.     Conjunctiva/sclera: Conjunctivae normal.  Cardiovascular:     Rate and Rhythm: Normal rate and regular rhythm.     Comments: +2 pitting edema bilateral lower extremity up to bilateral knees.  Positive JVD up to mid neck Pulmonary:     Effort: Pulmonary effort is normal. No respiratory distress.     Breath sounds: Normal breath sounds. No wheezing.  Abdominal:     General: There is distension (Mildly distended).  Neurological:     Mental Status: She is alert.  Psychiatric:        Behavior: Behavior normal.      Assessment & Plan:   See Encounters Tab for problem based charting.  Nephrotic range proteinuria Patient was hospitalized in June for left knee pseudogout.  She was also found to have acute on chronic renal failure.  Her creatinine on admission was 2.63 with GFR of 20 (baseline creatinine 1.7 with GFR 30).  Renal ultrasound was obtained which  showed no obstruction.  Nephrology was consulted and trial diuresis with IV Lasix with plus albumin which was not successful.  Renal artery Doppler was negative for any stenosis.  Creatinine at discharge was 3.66 with GFR of 14.  Repeat BMP 2 days ago showed creatinine of 1.28, GFR 16, potassium 5 BUN 71.  Protein electrophoresis was negative for M spike, not consistent with multiple myeloma.  Kappa/lambda ratio were elevated likely in the setting of her acute left knee pseudogout.  Her proteinuria range is 9.2 g, consistent with nephrotic syndrome.  Suspect secondary to her uncontrolled diabetes.  She was recently diagnosed with small cell lung cancer but did not receive chemotherapy.  Today patient presents with 30 pound weight gain since last hospitalization.  She now has +2 pitting edema bilateral lower extremity up to her knees.  Fortunately she has no respiratory issue and satting well on room air.  Lungs are clear but positive JVD.  She denies shortness of breath or orthopnea.  She reports little urine output with Lasix 40 mg daily.  -Stop Lasix and start torsemide 40 mg twice daily -No indication for oral anticoagulation right now -Recheck BMP today -Follow-up  on Monday, 02/11/2022 -We reached out to [facility] for her follow-up appointment -Advised patient to go to the ED if she developed shortness of breath or chest pain  Essential hypertension Blood pressure is elevated 172/62 in the setting of volume overload.  -Continue amlodipine 10 mg -Continue Coreg 50 mg twice daily -Continue hydralazine 50 mg 3 times daily -No losartan in the setting of AKI -Switch Lasix to torsemide 40 mg twice daily  Type 2 diabetes mellitus with peripheral neuropathy ([place]) -Order Dexcom 7 today -Will go over her diabetes regimen at next visit   Patient discussed with Dr.  Cain Sieve

## 2022-02-07 NOTE — Assessment & Plan Note (Signed)
-  Order Dexcom 7 today -Will go over her diabetes regimen at next visit

## 2022-02-07 NOTE — Assessment & Plan Note (Addendum)
Blood pressure is elevated 172/62 in the setting of volume overload.  -Continue amlodipine 10 mg -Continue Coreg 50 mg twice daily -Continue hydralazine 50 mg 3 times daily -No losartan in the setting of AKI -Switch Lasix to torsemide 40 mg twice daily

## 2022-02-08 NOTE — Progress Notes (Signed)
Internal Medicine Clinic Attending  Case discussed with Dr. Alfonse Spruce  At the time of the visit.  We reviewed the resident's history and exam and pertinent patient test results.  I agree with the assessment, diagnosis, and plan of care documented in the resident's note.    I'm concerned about this patient with nephrotic syndrome. She has gained 30 pounds since leaving the hospital while taking Lasix 40mg  PO daily. I think Torsemide might be more effective for her, so we will increase diuretic regimen to Torsemide 40mg  PO BID and follow up with her in <1 week (7/17 appointment scheduled).  At that visit, please recheck weight, volume exam, and BMP.

## 2022-02-09 LAB — BMP8+ANION GAP
Anion Gap: 14 mmol/L (ref 10.0–18.0)
BUN/Creatinine Ratio: 20 (ref 12–28)
BUN: 64 mg/dL — ABNORMAL HIGH (ref 8–27)
CO2: 18 mmol/L — ABNORMAL LOW (ref 20–29)
Calcium: 7.7 mg/dL — ABNORMAL LOW (ref 8.7–10.3)
Chloride: 108 mmol/L — ABNORMAL HIGH (ref 96–106)
Creatinine, Ser: 3.25 mg/dL — ABNORMAL HIGH (ref 0.57–1.00)
Glucose: 338 mg/dL — ABNORMAL HIGH (ref 70–99)
Potassium: 5.1 mmol/L (ref 3.5–5.2)
Sodium: 140 mmol/L (ref 134–144)
eGFR: 16 mL/min/{1.73_m2} — ABNORMAL LOW (ref >59)

## 2022-02-11 ENCOUNTER — Encounter: Payer: Self-pay | Admitting: Student

## 2022-02-11 ENCOUNTER — Telehealth: Payer: Self-pay

## 2022-02-11 ENCOUNTER — Ambulatory Visit (INDEPENDENT_AMBULATORY_CARE_PROVIDER_SITE_OTHER): Payer: 59 | Admitting: Student

## 2022-02-11 ENCOUNTER — Other Ambulatory Visit: Payer: Self-pay

## 2022-02-11 VITALS — BP 140/68 | HR 70 | Temp 97.7°F | Ht 67.5 in | Wt 150.2 lb

## 2022-02-11 DIAGNOSIS — I10 Essential (primary) hypertension: Secondary | ICD-10-CM | POA: Diagnosis not present

## 2022-02-11 DIAGNOSIS — E1142 Type 2 diabetes mellitus with diabetic polyneuropathy: Secondary | ICD-10-CM | POA: Diagnosis not present

## 2022-02-11 DIAGNOSIS — R809 Proteinuria, unspecified: Secondary | ICD-10-CM | POA: Diagnosis not present

## 2022-02-11 DIAGNOSIS — F17210 Nicotine dependence, cigarettes, uncomplicated: Secondary | ICD-10-CM

## 2022-02-11 MED ORDER — FUROSEMIDE 80 MG PO TABS: 80.0000 mg | ORAL_TABLET | Freq: Two times a day (BID) | ORAL | 0 refills | Status: DC

## 2022-02-11 NOTE — Assessment & Plan Note (Signed)
Blood pressure mildly elevated in the setting of volume overload.  -Continue amlodipine and Coreg -Advised patient to resume hydralazine.  She will let us know if she develops symptoms of facial swelling

## 2022-02-11 NOTE — Progress Notes (Signed)
CC: Follow-up for nephrotic syndrome  HPI:  [name] is a 61 y.o. with past medical history of hypertension, type 2 diabetes, squamous cell carcinoma of right lung and nephrotic range proteinuria, who presents to the clinic to follow-up on her diuresis.  Please see problem based charting for detail  Past Medical History:  Diagnosis Date   Anemia    Arthritis    CHF (congestive heart failure) ([place])    Chronic kidney disease    Depression    Diabetes mellitus    Headache    Hypercholesteremia    Hypertension    Pseudogout    Stroke ([place]) 10/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   Tobacco abuse    Review of Systems:  per HPI  Physical Exam:  Vitals:   02/11/22 1605 02/11/22 1617  BP: (!) 141/65 140/68  Pulse: 72 70  Temp: 97.7 F (36.5 C)   TempSrc: Oral   SpO2: 96%   Weight: 150 lb 3.2 oz (68.1 kg)   Height: 5' 7.5" (1.715 m)    Physical Exam Constitutional:      General: She is not in acute distress.    Appearance: She is not ill-appearing.  HENT:     Head: Normocephalic.     Comments: Mild edema of her face bilaterally.  Her lips also appear mildly swollen.  No tongue swelling or airway obstruction. Eyes:     General:        Right eye: No discharge.  Cardiovascular:     Rate and Rhythm: Normal rate and regular rhythm.     Comments: +2 bilateral lower extremity edema, mildly improved from last visit.  JVD almost to mandible angle Pulmonary:     Effort: Pulmonary effort is normal.     Comments: Mild crackles at bilateral lung bases. Abdominal:     General: There is no distension.     Palpations: Abdomen is soft.  Skin:    General: Skin is warm.  Neurological:     General: No focal deficit present.     Mental Status: She is alert.  Psychiatric:        Mood and Affect: Mood normal.      Assessment & Plan:   See Encounters Tab for problem based charting.  Nephrotic range proteinuria Patient is here to follow-up on her diuresis.  She has nephrotic range  proteinuria from her last hospitalization.  She was started on torsemide 40 mg twice daily at last visit.  Repeat BMP at last visit showed creatinine of 1.35, GFR 16, potassium 5.1 and CO2 of 18, not much improvement.  Today she reports doing better.  She has about 10 pounds weight loss from last visit a few days ago.  She endorses improvement of her bilateral lower extremity.  She denies any chest pain or shortness of breath.  Her weights came down from 160 to 150 pounds today.  Patient still have +2 bilateral lower extremity edema but mildly improved.  She still has JVD.  Mild crackles heard at bilateral lung bases.  Fortunately patient responded well to torsemide.  However she report facial swelling that started Saturday night.  Said that she was on 3 new medications including hydroxyzine, hydralazine or torsemide.  She denies any respiratory symptoms or airway obstruction.  No joint pain or erythema.  She endorses mild nausea.  She called the pharmacy who advised her to take Benadryl.  Her last dose of those medication was Saturday night.  Said that her facial  swelling has improved. I am not sure which medication is causing this symptom.  No evidence of lupus right reaction or Stevens-Johnson.   Given her concerned about the reaction, will switch torsemide to Lasix 80 mg twice daily for diuresis.  I encouraged patient to weigh herself every day but she does not have a scale at home and cannot afford new scale.  Advised patient to let us know if she notices any worsening swelling edema or shortness of breath.  -Follow-up in 4 days. -She still does not have a follow-up appointment with nephrology.  We reached out today -Repeat BMP    Essential hypertension Blood pressure mildly elevated in the setting of volume overload.  -Continue amlodipine and Coreg -Advised patient to resume hydralazine.  She will let us know if she develops symptoms of facial swelling  Type 2 diabetes mellitus with  peripheral neuropathy (HCC) A1C of 9.7 during last hospitalization.  She is currently taking insulin 70/30 15 units twice daily.  Denies any hypoglycemic events.  She currently does not check her blood sugar.  She just recently approved for Dexcom 7, which she will pick up today.  She is not a candidate for metformin or SGLT2 with her renal failure.  -Continue insulin 70/30 15 units twice daily -Obtain record from her Dexcom next visit   Patient discussed with Dr. Dareen Piano

## 2022-02-11 NOTE — Assessment & Plan Note (Signed)
A1C of 9.7 during last hospitalization.  She is currently taking insulin 70/30 15 units twice daily.  Denies any hypoglycemic events.  She currently does not check her blood sugar.  She just recently approved for Dexcom 7, which she will pick up today.  She is not a candidate for metformin or SGLT2 with her renal failure.  -Continue insulin 70/30 15 units twice daily -Obtain record from her Dexcom next visit

## 2022-02-11 NOTE — Telephone Encounter (Signed)
Pa for pt ( DEXCOM G7 sensor )  came through on cover my meds was submitted with  last office notes and labs .Marland Kitchen Awaiting approval or denial

## 2022-02-11 NOTE — Assessment & Plan Note (Addendum)
Patient is here to follow-up on her diuresis.  She has nephrotic range proteinuria from her last hospitalization.  She was started on torsemide 40 mg twice daily at last visit.  Repeat BMP at last visit showed creatinine of 1.35, GFR 16, potassium 5.1 and CO2 of 18, not much improvement.  Today she reports doing better.  She has about 10 pounds weight loss from last visit a few days ago.  She endorses improvement of her bilateral lower extremity.  She denies any chest pain or shortness of breath.  Her weights came down from 160 to 150 pounds today.  Patient still have +2 bilateral lower extremity edema but mildly improved.  She still has JVD.  Mild crackles heard at bilateral lung bases.  Fortunately patient responded well to torsemide.  However she report facial swelling that started Saturday night.  Said that she was on 3 new medications including hydroxyzine, hydralazine or torsemide.  She denies any respiratory symptoms or airway obstruction.  No joint pain or erythema.  She endorses mild nausea.  She called the pharmacy who advised her to take Benadryl.  Her last dose of those medication was Saturday night.  Said that her facial swelling has improved. I am not sure which medication is causing this symptom.  No evidence of lupus right reaction or Stevens-Johnson.   Given her concerned about the reaction, will switch torsemide to Lasix 80 mg twice daily for diuresis.  I encouraged patient to weigh herself every day but she does not have a scale at home and cannot afford new scale.  Advised patient to let us know if she notices any worsening swelling edema or shortness of breath.  -Follow-up in 4 days. -She still does not have a follow-up appointment with nephrology.  We reached out today -Repeat BMP  Addendum BMP showed a slight improvement of her kidney function.  Patient missed her appointment last week with me.  I tried to call patient last week and this week but unsuccessful.  I also called her  sons and sister but also unsuccessful.  I left a voicemail for her son Calle Schader to call back.

## 2022-02-11 NOTE — Patient Instructions (Signed)
Amber Cross,  It was nice seeing you in the clinic today.  I am glad that you are doing better.  Your weight has came down and looks like your swelling has improved as well.  However you still have a lot of extra fluid left in your body.  Given you may had a reaction with torsemide, I will start you on Lasix 80 mg twice daily for diuretics.  Please weight yourself every day.  If you notice that your weight is going up, please call us right away.  Please try to retake hydralazine again.  If you develop facial swelling, please let us know.  Please return on Friday  Take care,  Dr. Alfonse Spruce

## 2022-02-11 NOTE — Telephone Encounter (Signed)
DECISION :    Approved today  PA Case: [id number], Status: Approved,    Coverage Starts on: 02/11/2022 12:00 AM, Coverage Ends on: 02/12/2023 12:00 AM.    Questions? Contact [phone].   Drug Dexcom G7 [employer] Prior Authorization Form [id number] NCPDP)      ( COPY SENT TO PHARMACY ALSO )

## 2022-02-12 ENCOUNTER — Telehealth: Payer: Self-pay

## 2022-02-12 ENCOUNTER — Telehealth: Payer: Self-pay | Admitting: *Deleted

## 2022-02-12 LAB — BMP8+ANION GAP
Anion Gap: 16 mmol/L (ref 10.0–18.0)
BUN/Creatinine Ratio: 13 (ref 12–28)
BUN: 40 mg/dL — ABNORMAL HIGH (ref 8–27)
CO2: 19 mmol/L — ABNORMAL LOW (ref 20–29)
Calcium: 8.1 mg/dL — ABNORMAL LOW (ref 8.7–10.3)
Chloride: 110 mmol/L — ABNORMAL HIGH (ref 96–106)
Creatinine, Ser: 2.98 mg/dL — ABNORMAL HIGH (ref 0.57–1.00)
Glucose: 105 mg/dL — ABNORMAL HIGH (ref 70–99)
Potassium: 4 mmol/L (ref 3.5–5.2)
Sodium: 145 mmol/L — ABNORMAL HIGH (ref 134–144)
eGFR: 17 mL/min/{1.73_m2} — ABNORMAL LOW (ref >59)

## 2022-02-12 NOTE — Telephone Encounter (Signed)
Call from [place]. PT [facility]. Would like verbal orders for PT 2 times a week for 1 week.  1 time a week x 1 week.  2 times a week for 2 weeks and 1 time a week for 3 weeks.  Patient has facial swelling this morning.  Feet swelling this am as well.  B/P was 160/77.  Had just taken blood pressure medication.    Can call [facility] at [phone] with orders.

## 2022-02-12 NOTE — Telephone Encounter (Signed)
Pa for pt ( DEXCOM G7 RECEIVER )  came through on cover my meds .Marland Kitchen Was submitted but this has already been approved  because the sensor was approved yesterday ....   Additional Information Required   The [employer] Prior Authorization Team is unable to review this request for prior authorization as the medication has been previously approved. This approval will expire 02/12/2023.    No further action is needed at this time.   Drug Dexcom G7 Receiver Biomedical scientist Prior Authorization Form [phone] NCPDP)     ( COPY SENT TO PHARMACY ALSO )

## 2022-02-13 ENCOUNTER — Telehealth: Payer: Self-pay | Admitting: *Deleted

## 2022-02-13 NOTE — Telephone Encounter (Signed)
Call from [facility] Red PT with [facility].  With patient to do assessment for PT.  Patient states is experiencing Chest Pain at a level 7 out of 10.  B/P 148 /80 P- 80Oxygen level 92.  Pain goes across chest and down arm.  Has had Chest Pain before and this is different.  Advised to call EMS wants son to bring her instead.  Patient advised to get to an ER as soon as possible today.  Patient agreed [place] to the ER for assessment of her pain.  RTC from [facility] patient has drank a soda.  Has burped and now feels better and the pain has decreased to a 4. After a few more minutes has gone.  Don to complete his assessment of the patient as she has to go to the bathroom and if she has any chest pain or discomfort  have her come to the ER.  Information was given to patient's PCP .  Will call patient this afternoon to see how she is feeling.  Has an appointment in the Clinics on tomorrow as well.

## 2022-02-14 ENCOUNTER — Encounter: Payer: 59 | Admitting: Student

## 2022-02-15 ENCOUNTER — Telehealth: Payer: Self-pay | Admitting: *Deleted

## 2022-02-15 ENCOUNTER — Telehealth: Payer: Self-pay

## 2022-02-15 NOTE — Telephone Encounter (Signed)
Call from Caprice Red  PT from [facility] request verbal oders for patient for PT.  PT 1 time for 1 week .  Miss a week then PT x1 for 1 week.  LADL, exercise and pain management.  Don cam be reached at [phone] for questions.

## 2022-02-15 NOTE — Progress Notes (Signed)
Internal Medicine Clinic Attending  Case discussed with Dr. Nguyen  At the time of the visit.  We reviewed the resident's history and exam and pertinent patient test results.  I agree with the assessment, diagnosis, and plan of care documented in the resident's note. 

## 2022-02-20 ENCOUNTER — Encounter: Payer: Self-pay | Admitting: Student

## 2022-02-20 ENCOUNTER — Ambulatory Visit (INDEPENDENT_AMBULATORY_CARE_PROVIDER_SITE_OTHER): Payer: 59 | Admitting: Student

## 2022-02-20 ENCOUNTER — Telehealth: Payer: Self-pay | Admitting: *Deleted

## 2022-02-20 ENCOUNTER — Encounter: Payer: Self-pay | Admitting: Dietician

## 2022-02-20 ENCOUNTER — Ambulatory Visit (INDEPENDENT_AMBULATORY_CARE_PROVIDER_SITE_OTHER): Payer: 59 | Admitting: Dietician

## 2022-02-20 ENCOUNTER — Other Ambulatory Visit: Payer: Self-pay

## 2022-02-20 VITALS — BP 187/86 | HR 70 | Temp 98.0°F | Ht 67.0 in | Wt 143.0 lb

## 2022-02-20 DIAGNOSIS — Z794 Long term (current) use of insulin: Secondary | ICD-10-CM

## 2022-02-20 DIAGNOSIS — I10 Essential (primary) hypertension: Secondary | ICD-10-CM

## 2022-02-20 DIAGNOSIS — E1142 Type 2 diabetes mellitus with diabetic polyneuropathy: Secondary | ICD-10-CM

## 2022-02-20 DIAGNOSIS — F17210 Nicotine dependence, cigarettes, uncomplicated: Secondary | ICD-10-CM | POA: Diagnosis not present

## 2022-02-20 DIAGNOSIS — R809 Proteinuria, unspecified: Secondary | ICD-10-CM

## 2022-02-20 MED ORDER — FUROSEMIDE 80 MG PO TABS: 80.0000 mg | ORAL_TABLET | Freq: Two times a day (BID) | ORAL | 0 refills | Status: DC

## 2022-02-20 NOTE — Progress Notes (Signed)
Dexcom G7 Personal CGM Training  Start time:1130    End time: 6962 Total time: Amber Cross was educated about the following:  -Getting to know device    Software engineer ) -Setting up device (high alert  250  , low alert 85  ) -Rise alert set at   not set mg/dL  -Fall alert set at    not set  mg/dL  -Setting alert profile -Inserting sensor (she inserted it on her left arm and had 5 minutes to end of warm up when training was ended) -Ending sensor session -Trouble shooting -Reviewed insulin dosing from Dexcom and when  to [place] her glucometer.   Patient has [facility] tech support and my contact information.recommend follow up with me in 2 weeks  Debera Lat, RD 02/20/2022 12:00 PM.

## 2022-02-20 NOTE — Assessment & Plan Note (Signed)
Blood pressure elevated at 191/81.  Recheck 187/86.  Patient did not take her medications this morning due to nausea and vomiting.  States that her emesis was nonbloody.  She denies any change in her taste or tremors.  She is currently taking amlodipine and carvedilol.  She has stopped hydralazine from last visit due to facial swelling.  -I advised patient to resume hydralazine 50 mg 3 times daily and let me know if she continues to have swelling of her face.  I am not sure if hydralazine or torsemide is causing the side effects. -Continue amlodipine and carvedilol.  -Recheck BP in 1 week -BMP checked today

## 2022-02-20 NOTE — Progress Notes (Signed)
CC: Follow-up on nephrotic range proteinuria  HPI:  [name] is a 61 y.o. with past medical history of hypertension, type 2 diabetes, nephrotic range proteinuria, who presents to the clinic today to follow-up on her diuresis and kidney function.  Please see problem based charting for detail  Past Medical History:  Diagnosis Date   Anemia    Arthritis    CHF (congestive heart failure) ([place])    Chronic kidney disease    Depression    Diabetes mellitus    Headache    Hypercholesteremia    Hypertension    Pseudogout    Stroke ([place]) 10/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   Tobacco abuse    Review of Systems:  per HPI  Physical Exam:  Vitals:   02/20/22 1106 02/20/22 1200  BP: (!) 191/81 (!) 187/86  Pulse: 74 70  Temp: 98 F (36.7 C)   TempSrc: Oral   SpO2: 96%   Weight: 143 lb (64.9 kg)   Height: 5\' 7"  (1.702 m)    Physical Exam Constitutional:      Comments: Sad affect.  Patient was crying when she talked about her social situation.  HENT:     Head: Normocephalic.  Eyes:     General:        Right eye: No discharge.        Left eye: No discharge.     Conjunctiva/sclera: Conjunctivae normal.  Cardiovascular:     Rate and Rhythm: Normal rate and regular rhythm.     Comments: No JVD +2 pitting edema bilateral lower extremity up to knees Pulmonary:     Effort: Pulmonary effort is normal. No respiratory distress.     Breath sounds: No wheezing.  Abdominal:     General: There is no distension.     Palpations: Abdomen is soft.     Tenderness: There is no abdominal tenderness.  Musculoskeletal:     Cervical back: Normal range of motion.  Skin:    General: Skin is warm.  Neurological:     Mental Status: She is alert. Mental status is at baseline.  Psychiatric:        Behavior: Behavior normal.      Assessment & Plan:   See Encounters Tab for problem based charting.  Nephrotic range proteinuria Patient is here to follow-up on her nephrotic range  proteinuria.  She is currently taking Lasix 80 mg twice daily with good urine output.  Endorses persistent lower extremity edema but denies orthopnea.  Patient has not follow-up with nephrology.  States that her phone is not working because she was out of work since February and has no income currently.  She filed for disability but was denied.  Physical exam showed persistent +2 pitting edema bilateral lower extremity up to her knees but overall is improving.  Lungs are clear to auscultation without JVD.  Her weight also trending down from 150 to 143 pounds.  -Continue Lasix 80 mg twice daily for now -We will ask nephrology office to contact her son for appointment -Recheck BMP -Follow-up in 1 week -Referral to social worker for her difficult financial situation  Essential hypertension Blood pressure elevated at 191/81.  Recheck 187/86.  Patient did not take her medications this morning due to nausea and vomiting.  States that her emesis was nonbloody.  She denies any change in her taste or tremors.  She is currently taking amlodipine and carvedilol.  She has stopped hydralazine from last visit due to facial  swelling.  -I advised patient to resume hydralazine 50 mg 3 times daily and let me know if she continues to have swelling of her face.  I am not sure if hydralazine or torsemide is causing the side effects. -Continue amlodipine and carvedilol.  -Recheck BP in 1 week -BMP checked today   Type 2 diabetes mellitus with peripheral neuropathy (HCC) Patient report adherence to insulin 70/30 15 units twice daily. She brought her Dexcom and Butch Penny has helped with placement.  -Will assess her data next week before adjusting her insulin   Patient discussed with Dr.  Saverio Danker

## 2022-02-20 NOTE — Telephone Encounter (Signed)
   Telephone encounter was:  Successful.  02/20/2022 Name: Amber Cross MRN: [id number] DOB: [birth year]  Amber Cross is a 61 y.o. year old female who is a primary care patient of [name], DO . The community resource team was consulted for assistance with Food Insecurity and Financial Difficulties related to disability  Care guide performed the following interventions: Patient provided with information about care guide support team and interviewed to confirm resource needs.  Follow Up Plan:  Client will call back   [place], Care Management  [phone] [address] , [place] [address] Email : [email or web address]

## 2022-02-20 NOTE — Assessment & Plan Note (Addendum)
Patient is here to follow-up on her nephrotic range proteinuria.  She is currently taking Lasix 80 mg twice daily with good urine output.  Endorses persistent lower extremity edema but denies orthopnea.  Patient has not follow-up with nephrology.  States that her phone is not working because she was out of work since February and has no income currently.  She filed for disability but was denied.  Physical exam showed persistent +2 pitting edema bilateral lower extremity up to her knees but overall is improving.  Lungs are clear to auscultation without JVD.  Her weight also trending down from 150 to 143 pounds.  -Continue Lasix 80 mg twice daily for now -We will ask nephrology office to contact her son for appointment -Recheck BMP -Follow-up in 1 week -Referral to social worker for her difficult financial situation

## 2022-02-20 NOTE — Patient Instructions (Addendum)
[name],  It was nice seeing you in the clinic today.  Here is a summary what we talked about:  1.  Please continue Lasix 80 mg twice daily.  We will contact the kidney doctor office so they can schedule an appointment for you  2.  Please continue amlodipine and carvedilol for your blood pressure.  Please resume hydralazine 50 mg 3 times a day.  Let me know if you have any worsening swelling in your face.  3.  Butch Penny will help with Dexcom placement.  We will see how your blood sugar looks like at next visit.  Please return in 1 week  Take care,  Dr. Alfonse Spruce

## 2022-02-20 NOTE — Assessment & Plan Note (Signed)
Patient report adherence to insulin 70/30 15 units twice daily. She brought her Dexcom and Butch Penny has helped with placement.  -Will assess her data next week before adjusting her insulin

## 2022-02-21 LAB — BMP8+ANION GAP
Anion Gap: 14 mmol/L (ref 10.0–18.0)
BUN/Creatinine Ratio: 9 — ABNORMAL LOW (ref 12–28)
BUN: 25 mg/dL (ref 8–27)
CO2: 19 mmol/L — ABNORMAL LOW (ref 20–29)
Calcium: 7.9 mg/dL — ABNORMAL LOW (ref 8.7–10.3)
Chloride: 105 mmol/L (ref 96–106)
Creatinine, Ser: 2.8 mg/dL — ABNORMAL HIGH (ref 0.57–1.00)
Glucose: 166 mg/dL — ABNORMAL HIGH (ref 70–99)
Potassium: 3.5 mmol/L (ref 3.5–5.2)
Sodium: 138 mmol/L (ref 134–144)
eGFR: 19 mL/min/{1.73_m2} — ABNORMAL LOW (ref >59)

## 2022-02-21 NOTE — Progress Notes (Signed)
Internal Medicine Clinic Attending  Case discussed with Dr. Nguyen  At the time of the visit.  We reviewed the resident's history and exam and pertinent patient test results.  I agree with the assessment, diagnosis, and plan of care documented in the resident's note. 

## 2022-02-26 ENCOUNTER — Telehealth: Payer: Self-pay | Admitting: *Deleted

## 2022-02-26 NOTE — Telephone Encounter (Signed)
   Telephone encounter was:  Unsuccessful.  02/26/2022 Name: Amber Cross MRN: [id number] DOB: 09-07-1960  Unsuccessful outbound call made today to assist with:  Food Insecurity  Outreach Attempt:  2nd Attempt  A HIPAA compliant voice message was left requesting a return call.  Instructed patient to call back at   Instructed patient to call back at [phone]  at their earliest convenience. .  [place], Care Management  [phone] [address] , [place] [address] Email : [email or web address]

## 2022-02-28 ENCOUNTER — Telehealth: Payer: Self-pay | Admitting: *Deleted

## 2022-02-28 ENCOUNTER — Ambulatory Visit (INDEPENDENT_AMBULATORY_CARE_PROVIDER_SITE_OTHER): Payer: Commercial Managed Care - HMO | Admitting: Internal Medicine

## 2022-02-28 DIAGNOSIS — E1142 Type 2 diabetes mellitus with diabetic polyneuropathy: Secondary | ICD-10-CM | POA: Diagnosis not present

## 2022-02-28 DIAGNOSIS — F17210 Nicotine dependence, cigarettes, uncomplicated: Secondary | ICD-10-CM | POA: Diagnosis not present

## 2022-02-28 DIAGNOSIS — I10 Essential (primary) hypertension: Secondary | ICD-10-CM | POA: Diagnosis not present

## 2022-02-28 DIAGNOSIS — R809 Proteinuria, unspecified: Secondary | ICD-10-CM | POA: Diagnosis not present

## 2022-02-28 MED ORDER — ASPIRIN 81 MG PO TBEC: 81.0000 mg | DELAYED_RELEASE_TABLET | Freq: Every day | ORAL | 11 refills | Status: DC

## 2022-02-28 MED ORDER — CARVEDILOL 25 MG PO TABS: 50.0000 mg | ORAL_TABLET | Freq: Two times a day (BID) | ORAL | 2 refills | Status: DC

## 2022-02-28 MED ORDER — ALBUTEROL SULFATE HFA 108 (90 BASE) MCG/ACT IN AERS: 2.0000 | INHALATION_SPRAY | Freq: Four times a day (QID) | RESPIRATORY_TRACT | 2 refills | Status: DC | PRN

## 2022-02-28 MED ORDER — NOVOLIN 70/30 FLEXPEN RELION (70-30) 100 UNIT/ML ~~LOC~~ SUPN
PEN_INJECTOR | SUBCUTANEOUS | 2 refills | Status: DC
Start: 2022-02-28 — End: 2022-03-19

## 2022-02-28 NOTE — Telephone Encounter (Signed)
   Telephone encounter was:  Successful.  02/28/2022 Name: Amber Cross MRN: [id number] DOB: 1961/02/12  Amber Cross is a 61 y.o. year old female who is a primary care patient of [name], DO . The community resource team was consulted for assistance with Transportation Needs  and [place] guide performed the following interventions: Patient provided with information about care guide support team and interviewed to confirm resource needs. Patient has applied for Disability and has a lawyer she will get food bank information , transportation in [place] and senior resources of [place]  Follow Up Plan:  No further follow up planned at this time. The patient has been provided with needed resources.  [facility] [phone] [address] , [place] [address] Email : [email or web address]

## 2022-02-28 NOTE — Assessment & Plan Note (Signed)
Patient presents today for a 1 week follow-up on her nephrotic range proteinuria.  She was able to be seen by nephrology today in the have transitioned her to torsemide 100 mg daily and she is currently not taking Lasix.  Her lower extremity edema is unchanged however she feels like her facial edema may be somewhat better.  She is not having any respiratory concerns related to her edema.  I was able to see lab results from her appointment with them today and it does appear that she has an acute kidney injury on top of her other renal concerns.  She may also have acute cystitis though she denies any urinary symptoms at this time.  Physical exam today was remarkable for 2-3+ pitting edema to bilateral lower extremities at least to the level of her knees.  No crackles on pulmonary exam.  Her weight is up 4 pounds from her last visit 7-12/2021.  Plan: Patient has follow-up in 1 week with nephrology.  We will continue with torsemide per their plan.  We will have her follow-up with [place] in 2 weeks.

## 2022-02-28 NOTE — Patient Instructions (Addendum)
Amber Cross,  It was a pleasure to care for you today. I'm sorry that you're still not feeling your best but I am glad that you are starting to feel better gradually.   Please make sure to follow-up with your nephrologist next week. As we discussed, I do want you to take hydralazine in addition to amlodipine and carvedilol for your blood pressure.  For your diabetes I would like you to continue taking 15 units of insulin in the morning, but I want you to decrease your evening dose to 10 units.  Please follow-up with me in 2 weeks. If you need [place] before then, please do not hesitate to call.  My best, Dr. Marlou Sa

## 2022-02-28 NOTE — Progress Notes (Signed)
   CC: 1 week follow-up  HPI:  [name] is a 61 y.o. female with past medical history detailed below who presents for 1-week follow up for nephrotic range proteinuria. Please see problem based charting for detailed assessment and plan.  Past Medical History:  Diagnosis Date   Anemia    Arthritis    CHF (congestive heart failure) ([facility])    Chronic kidney disease    Depression    Diabetes mellitus    Headache    Hypercholesteremia    Hypertension    Pseudogout    Stroke ([facility]) 10/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   Tobacco abuse    Review of Systems:  Negative unless otherwise stated.  Physical Exam:  Vitals:   02/28/22 1529  BP: (!) 155/65  Pulse: 68  Temp: 98.4 F (36.9 C)  TempSrc: Oral  SpO2: 100%  Weight: 147 lb 11.2 oz (67 kg)   Constitutional:Chronically ill appearing female in no acute distress. HENT:No facial edema. Cardio:Regular rate and rhythm. No murmurs, rubs, or gallops.  Pulm:Clear to auscultation bilaterally. MSK:2-3+ pitting edema to bilateral LE. Skin:Warm and dry. Neuro:Awake, alert, and oriented. No focal deficit noted. Psych:Normal mood and affect.  Assessment & Plan:   See Encounters Tab for problem based charting.  Type 2 diabetes mellitus with peripheral neuropathy (HCC) Current prescribed regimen of insulin 70/30 15 units twice daily. She reports that she is treating her dosing more as a sliding scale approach and that if her blood sugar is not over 150 does not give herself insulin because she has had hypoglycemic episodes overnight with multiple notifications from her Dexcom.  Interpretation of CGM data: Amber Cross wore the CGM for 9 days. The average reading was 164, % time in target was 70, % time below target was <1, and % time above target was. 29. Plan:Continue insulin 70/30 15 units each morning and decrease to 10 units each evening.  Nephrotic range proteinuria Patient presents today for a 1 week follow-up on her nephrotic  range proteinuria.  She was able to be seen by nephrology today in the have transitioned her to torsemide 100 mg daily and she is currently not taking Lasix.  Her lower extremity edema is unchanged however she feels like her facial edema may be somewhat better.  She is not having any respiratory concerns related to her edema.  I was able to see lab results from her appointment with them today and it does appear that she has an acute kidney injury on top of her other renal concerns.  She may also have acute cystitis though she denies any urinary symptoms at this time.  Physical exam today was remarkable for 2-3+ pitting edema to bilateral lower extremities at least to the level of her knees.  No crackles on pulmonary exam.  Her weight is up 4 pounds from her last visit 7-12/2021.  Plan: Patient has follow-up in 1 week with nephrology.  We will continue with torsemide per their plan.  We will have her follow-up with [place] in 2 weeks.  Essential hypertension Blood pressure remains uncontrolled at 155/65, however this is much improved since her last office visit on 02-20-2022.  She has been taking amlodipine and carvedilol.  After her last appointment she was under the impression that she should try to locate her hydralazine but had not started taking it again at. Plan: Continue amlodipine, carvedilol, and restart hydralazine.  Follow-up in 2 weeks.   Patient discussed with Dr. Dareen Piano

## 2022-02-28 NOTE — Assessment & Plan Note (Signed)
Current prescribed regimen of insulin 70/30 15 units twice daily. She reports that she is treating her dosing more as a sliding scale approach and that if her blood sugar is not over 150 does not give herself insulin because she has had hypoglycemic episodes overnight with multiple notifications from her Dexcom.  Interpretation of CGM data: Amber Cross wore the CGM for 9 days. The average reading was 164, % time in target was 70, % time below target was <1, and % time above target was. 29. Plan:Continue insulin 70/30 15 units each morning and decrease to 10 units each evening.

## 2022-02-28 NOTE — Assessment & Plan Note (Signed)
Blood pressure remains uncontrolled at 155/65, however this is much improved since her last office visit on 02-20-2022.  She has been taking amlodipine and carvedilol.  After her last appointment she was under the impression that she should try to locate her hydralazine but had not started taking it again at. Plan: Continue amlodipine, carvedilol, and restart hydralazine.  Follow-up in 2 weeks.

## 2022-03-01 NOTE — Progress Notes (Signed)
Internal Medicine Clinic Attending ? ?Case discussed with Dr. Dean  At the time of the visit.  We reviewed the resident?s history and exam and pertinent patient test results.  I agree with the assessment, diagnosis, and plan of care documented in the resident?s note.  ?

## 2022-03-04 ENCOUNTER — Telehealth: Payer: Self-pay

## 2022-03-04 NOTE — Telephone Encounter (Signed)
Pa for pt ( NOV[employer] ) came through on cover my meds .Marland Kitchen But the key does not work and when I tried to run it  from start with out the key  it stated that patient is not covered by plan.Marland KitchenMarland Kitchen

## 2022-03-05 ENCOUNTER — Telehealth: Payer: Self-pay | Admitting: Student

## 2022-03-05 NOTE — Telephone Encounter (Signed)
OT Nadara Eaton from [facility] requesting VO for 2 visits for OT .  Pt missed prev 2 appts.  Please call back to [phone].

## 2022-03-05 NOTE — Telephone Encounter (Signed)
Attempts to call patient to notify of need for appointment per dDr. Alfonse Spruce.  Unable to reach at patient's numbers in chart.  Call to patient's son Freida Busman.  Given appointment for Friday am at 9:45 AM.  Freida Busman to contact patient with appointment.

## 2022-03-05 NOTE — Telephone Encounter (Signed)
Pt called back and she changed the appt to 8/10 @ 2:15 with Dr Raymondo Band .Marland Kitchen She stated that her son has an appt the same day and time

## 2022-03-05 NOTE — Telephone Encounter (Signed)
Received a call from [facility] PT who is concerned about patient's worsening lower extremity edema.  Patient also reports diarrhea.  Patient has seen nephrology a few days ago and will follow-up on 8/10.  Patient has a follow-up with me on 8/22 but will try to get her in earlier for evaluation.  We will try to get records from [facility] as well.

## 2022-03-05 NOTE — Telephone Encounter (Signed)
Attempts to Call [facility] PT for patient.  Message left that Clinics had called and will forward message to doctor for verbal orders for PT.

## 2022-03-05 NOTE — Telephone Encounter (Signed)
Received a call from [facility] PT who is concerned about patient's worsening lower extremity edema and diarrhea.  Patient recently followed with [facility] on 8/3 and will return to see them on 8/10.  Patient has an appointment to see me on 8/22 but will try to get her in sooner for reevaluation.

## 2022-03-06 ENCOUNTER — Telehealth: Payer: Self-pay | Admitting: *Deleted

## 2022-03-06 DIAGNOSIS — R5381 Other malaise: Secondary | ICD-10-CM

## 2022-03-06 NOTE — Telephone Encounter (Signed)
CM sent to Stephannie Peters at [facility] for a Tub transfer bench on the recommendation of the patient's [facility] OT.

## 2022-03-06 NOTE — Telephone Encounter (Signed)
Received call from Sherry Ruffing , [place] with [facility]. He is requesting an order for a Tub Transfer Bench so patient can get safely in and out of bathtub.

## 2022-03-08 ENCOUNTER — Encounter: Payer: 59 | Admitting: Internal Medicine

## 2022-03-11 ENCOUNTER — Other Ambulatory Visit: Payer: Self-pay | Admitting: Nephrology

## 2022-03-11 ENCOUNTER — Other Ambulatory Visit (HOSPITAL_COMMUNITY): Payer: Self-pay | Admitting: Nephrology

## 2022-03-11 DIAGNOSIS — R809 Proteinuria, unspecified: Secondary | ICD-10-CM

## 2022-03-11 DIAGNOSIS — N184 Chronic kidney disease, stage 4 (severe): Secondary | ICD-10-CM

## 2022-03-12 ENCOUNTER — Telehealth: Payer: Self-pay | Admitting: *Deleted

## 2022-03-12 NOTE — Telephone Encounter (Signed)
Bhav, PT with [facility] called in stating patient is c/o CP, difficulty breathing, and right flank pain. BP 167/75 SpO2 98% on RA. Spoke with patient as well and advised she head directly to ED. States she will have her son take her now.

## 2022-03-13 ENCOUNTER — Encounter (HOSPITAL_COMMUNITY): Payer: Self-pay

## 2022-03-13 ENCOUNTER — Other Ambulatory Visit: Payer: Self-pay

## 2022-03-13 ENCOUNTER — Emergency Department (HOSPITAL_COMMUNITY): Payer: Commercial Managed Care - HMO

## 2022-03-13 ENCOUNTER — Inpatient Hospital Stay (HOSPITAL_COMMUNITY)
Admission: EM | Admit: 2022-03-13 | Discharge: 2022-03-18 | DRG: 304 | Disposition: A | Discharge status: Home Health | Payer: Commercial Managed Care - HMO | Attending: Family Medicine | Admitting: Family Medicine

## 2022-03-13 DIAGNOSIS — F32A Depression, unspecified: Secondary | ICD-10-CM | POA: Diagnosis present

## 2022-03-13 DIAGNOSIS — N179 Acute kidney failure, unspecified: Secondary | ICD-10-CM

## 2022-03-13 DIAGNOSIS — Z794 Long term (current) use of insulin: Secondary | ICD-10-CM

## 2022-03-13 DIAGNOSIS — E1122 Type 2 diabetes mellitus with diabetic chronic kidney disease: Secondary | ICD-10-CM | POA: Diagnosis present

## 2022-03-13 DIAGNOSIS — Z880 Allergy status to penicillin: Secondary | ICD-10-CM

## 2022-03-13 DIAGNOSIS — K529 Noninfective gastroenteritis and colitis, unspecified: Secondary | ICD-10-CM | POA: Diagnosis present

## 2022-03-13 DIAGNOSIS — R609 Edema, unspecified: Principal | ICD-10-CM

## 2022-03-13 DIAGNOSIS — E876 Hypokalemia: Secondary | ICD-10-CM

## 2022-03-13 DIAGNOSIS — D631 Anemia in chronic kidney disease: Secondary | ICD-10-CM | POA: Diagnosis present

## 2022-03-13 DIAGNOSIS — J9691 Respiratory failure, unspecified with hypoxia: Secondary | ICD-10-CM

## 2022-03-13 DIAGNOSIS — R9431 Abnormal electrocardiogram [ECG] [EKG]: Secondary | ICD-10-CM

## 2022-03-13 DIAGNOSIS — I5043 Acute on chronic combined systolic (congestive) and diastolic (congestive) heart failure: Secondary | ICD-10-CM | POA: Diagnosis present

## 2022-03-13 DIAGNOSIS — F17210 Nicotine dependence, cigarettes, uncomplicated: Secondary | ICD-10-CM | POA: Diagnosis present

## 2022-03-13 DIAGNOSIS — I130 Hypertensive heart and chronic kidney disease with heart failure and stage 1 through stage 4 chronic kidney disease, or unspecified chronic kidney disease: Secondary | ICD-10-CM | POA: Diagnosis present

## 2022-03-13 DIAGNOSIS — N049 Nephrotic syndrome with unspecified morphologic changes: Secondary | ICD-10-CM | POA: Diagnosis present

## 2022-03-13 DIAGNOSIS — R079 Chest pain, unspecified: Secondary | ICD-10-CM | POA: Diagnosis present

## 2022-03-13 DIAGNOSIS — J410 Simple chronic bronchitis: Secondary | ICD-10-CM | POA: Diagnosis present

## 2022-03-13 DIAGNOSIS — J9601 Acute respiratory failure with hypoxia: Secondary | ICD-10-CM | POA: Diagnosis present

## 2022-03-13 DIAGNOSIS — R778 Other specified abnormalities of plasma proteins: Secondary | ICD-10-CM

## 2022-03-13 DIAGNOSIS — E872 Acidosis, unspecified: Secondary | ICD-10-CM

## 2022-03-13 DIAGNOSIS — L299 Pruritus, unspecified: Secondary | ICD-10-CM | POA: Diagnosis present

## 2022-03-13 DIAGNOSIS — Z9849 Cataract extraction status, unspecified eye: Secondary | ICD-10-CM

## 2022-03-13 DIAGNOSIS — I5042 Chronic combined systolic (congestive) and diastolic (congestive) heart failure: Secondary | ICD-10-CM | POA: Diagnosis present

## 2022-03-13 DIAGNOSIS — Z91018 Allergy to other foods: Secondary | ICD-10-CM

## 2022-03-13 DIAGNOSIS — R700 Elevated erythrocyte sedimentation rate: Secondary | ICD-10-CM

## 2022-03-13 DIAGNOSIS — E7800 Pure hypercholesterolemia, unspecified: Secondary | ICD-10-CM | POA: Diagnosis present

## 2022-03-13 DIAGNOSIS — Z7982 Long term (current) use of aspirin: Secondary | ICD-10-CM

## 2022-03-13 DIAGNOSIS — I5023 Acute on chronic systolic (congestive) heart failure: Secondary | ICD-10-CM | POA: Diagnosis present

## 2022-03-13 DIAGNOSIS — Z20822 Contact with and (suspected) exposure to covid-19: Secondary | ICD-10-CM | POA: Diagnosis present

## 2022-03-13 DIAGNOSIS — Z79899 Other long term (current) drug therapy: Secondary | ICD-10-CM

## 2022-03-13 DIAGNOSIS — Z923 Personal history of irradiation: Secondary | ICD-10-CM

## 2022-03-13 DIAGNOSIS — E8770 Fluid overload, unspecified: Secondary | ICD-10-CM | POA: Diagnosis present

## 2022-03-13 DIAGNOSIS — I160 Hypertensive urgency: Principal | ICD-10-CM

## 2022-03-13 DIAGNOSIS — E1165 Type 2 diabetes mellitus with hyperglycemia: Secondary | ICD-10-CM | POA: Diagnosis present

## 2022-03-13 DIAGNOSIS — Z8673 Personal history of transient ischemic attack (TIA), and cerebral infarction without residual deficits: Secondary | ICD-10-CM

## 2022-03-13 DIAGNOSIS — Z8249 Family history of ischemic heart disease and other diseases of the circulatory system: Secondary | ICD-10-CM

## 2022-03-13 DIAGNOSIS — Z833 Family history of diabetes mellitus: Secondary | ICD-10-CM

## 2022-03-13 DIAGNOSIS — I509 Heart failure, unspecified: Secondary | ICD-10-CM

## 2022-03-13 DIAGNOSIS — N1832 Chronic kidney disease, stage 3b: Secondary | ICD-10-CM | POA: Diagnosis present

## 2022-03-13 DIAGNOSIS — Z85118 Personal history of other malignant neoplasm of bronchus and lung: Secondary | ICD-10-CM

## 2022-03-13 DIAGNOSIS — I5022 Chronic systolic (congestive) heart failure: Secondary | ICD-10-CM | POA: Diagnosis present

## 2022-03-13 DIAGNOSIS — E1142 Type 2 diabetes mellitus with diabetic polyneuropathy: Secondary | ICD-10-CM

## 2022-03-13 LAB — CBC WITH DIFFERENTIAL/PLATELET
Abs Immature Granulocytes: 0.02 10*3/uL (ref 0.00–0.07)
Basophils Absolute: 0 10*3/uL (ref 0.0–0.1)
Basophils Relative: 0 %
Eosinophils Absolute: 0 10*3/uL (ref 0.0–0.5)
Eosinophils Relative: 1 %
HCT: 26.4 % — ABNORMAL LOW (ref 36.0–46.0)
Hemoglobin: 8.5 g/dL — ABNORMAL LOW (ref 12.0–15.0)
Immature Granulocytes: 0 %
Lymphocytes Relative: 14 %
Lymphs Abs: 1.2 10*3/uL (ref 0.7–4.0)
MCH: 28.4 pg (ref 26.0–34.0)
MCHC: 32.2 g/dL (ref 30.0–36.0)
MCV: 88.3 fL (ref 80.0–100.0)
Monocytes Absolute: 0.4 10*3/uL (ref 0.1–1.0)
Monocytes Relative: 5 %
Neutro Abs: 6.5 10*3/uL (ref 1.7–7.7)
Neutrophils Relative %: 80 %
Platelets: 342 10*3/uL (ref 150–400)
RBC: 2.99 MIL/uL — ABNORMAL LOW (ref 3.87–5.11)
RDW: 15.9 % — ABNORMAL HIGH (ref 11.5–15.5)
WBC: 8.1 10*3/uL (ref 4.0–10.5)
nRBC: 0 % (ref 0.0–0.2)

## 2022-03-13 LAB — TROPONIN I (HIGH SENSITIVITY)
Troponin I (High Sensitivity): 19 ng/L — ABNORMAL HIGH (ref <18)
Troponin I (High Sensitivity): 20 ng/L — ABNORMAL HIGH (ref <18)

## 2022-03-13 LAB — BRAIN NATRIURETIC PEPTIDE: B Natriuretic Peptide: 438.8 pg/mL — ABNORMAL HIGH (ref 0.0–100.0)

## 2022-03-13 MED ORDER — IPRATROPIUM-ALBUTEROL 0.5-2.5 (3) MG/3ML IN SOLN
3.0000 mL | Freq: Once | RESPIRATORY_TRACT | Status: AC
  Administered 2022-03-14: 3 mL via RESPIRATORY_TRACT
  Filled 2022-03-13: qty 3

## 2022-03-13 NOTE — ED Provider Triage Note (Signed)
Emergency Medicine Provider Triage Evaluation Note  Amber Cross , a 61 y.o. female  was evaluated in triage.  Pt complains of shortness of breath.  Patient states that she has shortness of breath over the past 2 weeks is gotten progressively worse.  She has a history of heart failure and has been taking her home Lasix as prescribed.  She notes chest pain with activity at her baseline for the past several months and reports no change from this baseline over the past 2 weeks.  She was seen by her primary care provider via the telephone and was told to come the emergency department for admission.  Shortness of breath is worse with exertion as well as lying flat.  She has noted an increase in nonproductive cough as well over the past 2 weeks.  Denies fever, chills, night sweats, abdominal pain, nausea, vomiting, urinary/vaginal symptoms, change in bowel habits.  Review of Systems  Positive: See above Negative:   Physical Exam  BP (!) 180/75   Pulse 83   Temp 98 F (36.7 C) (Oral)   Resp 18   Ht 5' 7.5" (1.715 m)   Wt 64 kg   LMP 09/29/2011   SpO2 97%   BMI 21.76 kg/m  Gen:   Awake, no distress   Resp:  Normal effort  MSK:   Moves extremities without difficulty  Other:  2-3+ pitting edema noted bilateral lower extremities.  Possible S3 auscultated on cardiac exam.  Decreased lung sounds mild Rales noted bilateral lower lung fields.  Medical Decision Making  Medically screening exam initiated at 4:41 PM.  Appropriate orders placed.  Amber Cross was informed that the remainder of the evaluation will be completed by another provider, this initial triage assessment does not replace that evaluation, and the importance of remaining in the ED until their evaluation is complete.     [name], [place] 03/13/22 1643

## 2022-03-13 NOTE — ED Triage Notes (Addendum)
Pt sent to ED by her pcp. Reports increased swelling to lower extremities and sob over the past couple of weeks. Pitting edema noted. States her lasix has not been helping.  NAD noted during triage.

## 2022-03-13 NOTE — ED Notes (Signed)
While looking for another pt just outside of ED, this pt got my attention and asked if I could see where she is in line for a room. Explained to pt that we see pt's based on who is sickest/most injured, not based on a first come first serve basis. Pt states that this is not fair and asked if she needed to pass out on the floor, which I advised against. Pt very irritated and again asks if I can just give her a ballpark timeframe, which I advised I was unable to do. Triage RN notified of same.

## 2022-03-14 ENCOUNTER — Encounter: Payer: 59 | Admitting: Internal Medicine

## 2022-03-14 ENCOUNTER — Inpatient Hospital Stay (HOSPITAL_COMMUNITY): Payer: Commercial Managed Care - HMO

## 2022-03-14 ENCOUNTER — Encounter (HOSPITAL_COMMUNITY): Payer: Commercial Managed Care - HMO

## 2022-03-14 ENCOUNTER — Observation Stay (HOSPITAL_COMMUNITY): Payer: Commercial Managed Care - HMO

## 2022-03-14 ENCOUNTER — Encounter (HOSPITAL_COMMUNITY): Payer: Self-pay | Admitting: Emergency Medicine

## 2022-03-14 DIAGNOSIS — Z85118 Personal history of other malignant neoplasm of bronchus and lung: Secondary | ICD-10-CM | POA: Diagnosis not present

## 2022-03-14 DIAGNOSIS — Z9849 Cataract extraction status, unspecified eye: Secondary | ICD-10-CM | POA: Diagnosis not present

## 2022-03-14 DIAGNOSIS — R079 Chest pain, unspecified: Secondary | ICD-10-CM | POA: Diagnosis not present

## 2022-03-14 DIAGNOSIS — N1832 Chronic kidney disease, stage 3b: Secondary | ICD-10-CM

## 2022-03-14 DIAGNOSIS — Z20822 Contact with and (suspected) exposure to covid-19: Secondary | ICD-10-CM | POA: Diagnosis present

## 2022-03-14 DIAGNOSIS — E1165 Type 2 diabetes mellitus with hyperglycemia: Secondary | ICD-10-CM | POA: Diagnosis present

## 2022-03-14 DIAGNOSIS — E8770 Fluid overload, unspecified: Secondary | ICD-10-CM | POA: Diagnosis present

## 2022-03-14 DIAGNOSIS — I5043 Acute on chronic combined systolic (congestive) and diastolic (congestive) heart failure: Secondary | ICD-10-CM | POA: Diagnosis not present

## 2022-03-14 DIAGNOSIS — I160 Hypertensive urgency: Secondary | ICD-10-CM | POA: Diagnosis present

## 2022-03-14 DIAGNOSIS — E876 Hypokalemia: Secondary | ICD-10-CM | POA: Diagnosis present

## 2022-03-14 DIAGNOSIS — Z7982 Long term (current) use of aspirin: Secondary | ICD-10-CM | POA: Diagnosis not present

## 2022-03-14 DIAGNOSIS — Z794 Long term (current) use of insulin: Secondary | ICD-10-CM | POA: Diagnosis not present

## 2022-03-14 DIAGNOSIS — I5042 Chronic combined systolic (congestive) and diastolic (congestive) heart failure: Secondary | ICD-10-CM | POA: Diagnosis present

## 2022-03-14 DIAGNOSIS — Z923 Personal history of irradiation: Secondary | ICD-10-CM | POA: Diagnosis not present

## 2022-03-14 DIAGNOSIS — F32A Depression, unspecified: Secondary | ICD-10-CM | POA: Diagnosis present

## 2022-03-14 DIAGNOSIS — R609 Edema, unspecified: Secondary | ICD-10-CM | POA: Diagnosis present

## 2022-03-14 DIAGNOSIS — I509 Heart failure, unspecified: Secondary | ICD-10-CM

## 2022-03-14 DIAGNOSIS — I5021 Acute systolic (congestive) heart failure: Secondary | ICD-10-CM | POA: Diagnosis not present

## 2022-03-14 DIAGNOSIS — E7800 Pure hypercholesterolemia, unspecified: Secondary | ICD-10-CM | POA: Diagnosis present

## 2022-03-14 DIAGNOSIS — R9431 Abnormal electrocardiogram [ECG] [EKG]: Secondary | ICD-10-CM

## 2022-03-14 DIAGNOSIS — I130 Hypertensive heart and chronic kidney disease with heart failure and stage 1 through stage 4 chronic kidney disease, or unspecified chronic kidney disease: Secondary | ICD-10-CM | POA: Diagnosis present

## 2022-03-14 DIAGNOSIS — N049 Nephrotic syndrome with unspecified morphologic changes: Secondary | ICD-10-CM | POA: Diagnosis present

## 2022-03-14 DIAGNOSIS — Z79899 Other long term (current) drug therapy: Secondary | ICD-10-CM | POA: Diagnosis not present

## 2022-03-14 DIAGNOSIS — E1122 Type 2 diabetes mellitus with diabetic chronic kidney disease: Secondary | ICD-10-CM | POA: Diagnosis present

## 2022-03-14 DIAGNOSIS — I5022 Chronic systolic (congestive) heart failure: Secondary | ICD-10-CM | POA: Diagnosis not present

## 2022-03-14 DIAGNOSIS — N179 Acute kidney failure, unspecified: Secondary | ICD-10-CM | POA: Diagnosis present

## 2022-03-14 DIAGNOSIS — J410 Simple chronic bronchitis: Secondary | ICD-10-CM | POA: Diagnosis present

## 2022-03-14 DIAGNOSIS — E872 Acidosis, unspecified: Secondary | ICD-10-CM | POA: Diagnosis present

## 2022-03-14 DIAGNOSIS — R778 Other specified abnormalities of plasma proteins: Secondary | ICD-10-CM | POA: Diagnosis not present

## 2022-03-14 DIAGNOSIS — D631 Anemia in chronic kidney disease: Secondary | ICD-10-CM | POA: Diagnosis present

## 2022-03-14 DIAGNOSIS — J9601 Acute respiratory failure with hypoxia: Secondary | ICD-10-CM

## 2022-03-14 DIAGNOSIS — F17210 Nicotine dependence, cigarettes, uncomplicated: Secondary | ICD-10-CM | POA: Diagnosis present

## 2022-03-14 DIAGNOSIS — Z8673 Personal history of transient ischemic attack (TIA), and cerebral infarction without residual deficits: Secondary | ICD-10-CM | POA: Diagnosis not present

## 2022-03-14 DIAGNOSIS — J9691 Respiratory failure, unspecified with hypoxia: Secondary | ICD-10-CM

## 2022-03-14 LAB — COMPREHENSIVE METABOLIC PANEL
ALT: 15 U/L (ref 0–44)
AST: 14 U/L — ABNORMAL LOW (ref 15–41)
Albumin: 2.1 g/dL — ABNORMAL LOW (ref 3.5–5.0)
Alkaline Phosphatase: 142 U/L — ABNORMAL HIGH (ref 38–126)
Anion gap: 11 (ref 5–15)
BUN: 37 mg/dL — ABNORMAL HIGH (ref 6–20)
CO2: 18 mmol/L — ABNORMAL LOW (ref 22–32)
Calcium: 7.6 mg/dL — ABNORMAL LOW (ref 8.9–10.3)
Chloride: 112 mmol/L — ABNORMAL HIGH (ref 98–111)
Creatinine, Ser: 3.59 mg/dL — ABNORMAL HIGH (ref 0.44–1.00)
GFR, Estimated: 14 mL/min — ABNORMAL LOW (ref >60)
Glucose, Bld: 221 mg/dL — ABNORMAL HIGH (ref 70–99)
Potassium: 2.7 mmol/L — CL (ref 3.5–5.1)
Sodium: 141 mmol/L (ref 135–145)
Total Bilirubin: 0.3 mg/dL (ref 0.3–1.2)
Total Protein: 5.5 g/dL — ABNORMAL LOW (ref 6.5–8.1)

## 2022-03-14 LAB — ECHOCARDIOGRAM LIMITED
Calc EF: 44.5 %
Height: 67.5 in
Single Plane A2C EF: 46.3 %
Single Plane A4C EF: 45.4 %
Weight: 2256 oz

## 2022-03-14 LAB — GLUCOSE, CAPILLARY
Glucose-Capillary: 168 mg/dL — ABNORMAL HIGH (ref 70–99)
Glucose-Capillary: 131 mg/dL — ABNORMAL HIGH (ref 70–99)

## 2022-03-14 LAB — BASIC METABOLIC PANEL
Anion gap: 8 (ref 5–15)
BUN: 37 mg/dL — ABNORMAL HIGH (ref 6–20)
CO2: 22 mmol/L (ref 22–32)
Calcium: 8.2 mg/dL — ABNORMAL LOW (ref 8.9–10.3)
Chloride: 112 mmol/L — ABNORMAL HIGH (ref 98–111)
Creatinine, Ser: 3.64 mg/dL — ABNORMAL HIGH (ref 0.44–1.00)
GFR, Estimated: 14 mL/min — ABNORMAL LOW (ref >60)
Glucose, Bld: 153 mg/dL — ABNORMAL HIGH (ref 70–99)
Potassium: 2.9 mmol/L — ABNORMAL LOW (ref 3.5–5.1)
Sodium: 142 mmol/L (ref 135–145)

## 2022-03-14 LAB — MAGNESIUM: Magnesium: 1.7 mg/dL (ref 1.7–2.4)

## 2022-03-14 LAB — CBG MONITORING, ED
Glucose-Capillary: 178 mg/dL — ABNORMAL HIGH (ref 70–99)
Glucose-Capillary: 66 mg/dL — ABNORMAL LOW (ref 70–99)
Glucose-Capillary: 141 mg/dL — ABNORMAL HIGH (ref 70–99)

## 2022-03-14 LAB — SARS CORONAVIRUS 2 BY RT PCR: SARS Coronavirus 2 by RT PCR: NEGATIVE

## 2022-03-14 MED ORDER — AMLODIPINE BESYLATE 10 MG PO TABS
10.0000 mg | ORAL_TABLET | Freq: Every day | ORAL | Status: DC
  Administered 2022-03-15 – 2022-03-18 (×4): 10 mg via ORAL
  Filled 2022-03-14 (×4): qty 1

## 2022-03-14 MED ORDER — ACETAMINOPHEN 500 MG PO TABS
ORAL_TABLET | ORAL | Status: AC
  Filled 2022-03-14: qty 2

## 2022-03-14 MED ORDER — FUROSEMIDE 10 MG/ML IJ SOLN
40.0000 mg | Freq: Once | INTRAMUSCULAR | Status: AC
  Administered 2022-03-14: 40 mg via INTRAVENOUS
  Filled 2022-03-14: qty 4

## 2022-03-14 MED ORDER — HYDRALAZINE HCL 25 MG PO TABS
100.0000 mg | ORAL_TABLET | Freq: Three times a day (TID) | ORAL | Status: DC
Start: 2022-03-14 — End: 2022-03-14

## 2022-03-14 MED ORDER — HYDRALAZINE HCL 25 MG PO TABS: 75.0000 mg | ORAL_TABLET | Freq: Three times a day (TID) | ORAL | Status: DC

## 2022-03-14 MED ORDER — POTASSIUM CHLORIDE CRYS ER 20 MEQ PO TBCR
20.0000 meq | EXTENDED_RELEASE_TABLET | Freq: Once | ORAL | Status: AC
  Administered 2022-03-14: 20 meq via ORAL
  Filled 2022-03-14: qty 1

## 2022-03-14 MED ORDER — HEPARIN SODIUM (PORCINE) 5000 UNIT/ML IJ SOLN
5000.0000 [IU] | Freq: Three times a day (TID) | INTRAMUSCULAR | Status: DC
  Administered 2022-03-14 – 2022-03-17 (×12): 5000 [IU] via SUBCUTANEOUS
  Filled 2022-03-14 (×12): qty 1

## 2022-03-14 MED ORDER — HYDRALAZINE HCL 50 MG PO TABS
50.0000 mg | ORAL_TABLET | Freq: Three times a day (TID) | ORAL | Status: DC
  Administered 2022-03-14 – 2022-03-18 (×12): 50 mg via ORAL
  Filled 2022-03-14 (×11): qty 1
  Filled 2022-03-14: qty 2

## 2022-03-14 MED ORDER — HYDRALAZINE HCL 20 MG/ML IJ SOLN
10.0000 mg | Freq: Four times a day (QID) | INTRAMUSCULAR | Status: DC | PRN
  Administered 2022-03-14 (×2): 10 mg via INTRAVENOUS
  Filled 2022-03-14 (×3): qty 1

## 2022-03-14 MED ORDER — INSULIN ASPART 100 UNIT/ML IJ SOLN
0.0000 [IU] | Freq: Three times a day (TID) | INTRAMUSCULAR | Status: DC
  Administered 2022-03-14: 1 [IU] via SUBCUTANEOUS
  Administered 2022-03-14 (×2): 2 [IU] via SUBCUTANEOUS
  Administered 2022-03-15 (×2): 1 [IU] via SUBCUTANEOUS
  Administered 2022-03-16 (×2): 2 [IU] via SUBCUTANEOUS
  Administered 2022-03-17: 3 [IU] via SUBCUTANEOUS
  Administered 2022-03-17: 2 [IU] via SUBCUTANEOUS
  Administered 2022-03-18: 1 [IU] via SUBCUTANEOUS
  Filled 2022-03-14: qty 0.09

## 2022-03-14 MED ORDER — POTASSIUM CHLORIDE CRYS ER 20 MEQ PO TBCR
40.0000 meq | EXTENDED_RELEASE_TABLET | Freq: Four times a day (QID) | ORAL | Status: AC
  Administered 2022-03-14 (×2): 40 meq via ORAL
  Filled 2022-03-14 (×2): qty 2

## 2022-03-14 MED ORDER — NITROGLYCERIN 2 % TD OINT
1.0000 [in_us] | TOPICAL_OINTMENT | Freq: Once | TRANSDERMAL | Status: AC
  Administered 2022-03-14: 1 [in_us] via TOPICAL
  Filled 2022-03-14: qty 1

## 2022-03-14 MED ORDER — TECHNETIUM TO 99M ALBUMIN AGGREGATED
4.4000 | Freq: Once | INTRAVENOUS | Status: AC
  Administered 2022-03-14: 4.4 via INTRAVENOUS

## 2022-03-14 MED ORDER — INSULIN ASPART 100 UNIT/ML IJ SOLN
0.0000 [IU] | Freq: Every day | INTRAMUSCULAR | Status: DC
  Filled 2022-03-14: qty 0.05

## 2022-03-14 MED ORDER — FUROSEMIDE 10 MG/ML IJ SOLN
80.0000 mg | Freq: Two times a day (BID) | INTRAMUSCULAR | Status: DC
  Administered 2022-03-14 – 2022-03-16 (×5): 80 mg via INTRAVENOUS
  Filled 2022-03-14 (×5): qty 8

## 2022-03-14 MED ORDER — MAGNESIUM SULFATE IN D5W 1-5 GM/100ML-% IV SOLN
1.0000 g | Freq: Once | INTRAVENOUS | Status: AC
  Administered 2022-03-14: 1 g via INTRAVENOUS
  Filled 2022-03-14: qty 100

## 2022-03-14 MED ORDER — AMLODIPINE BESYLATE 5 MG PO TABS: 5.0000 mg | ORAL_TABLET | Freq: Every day | ORAL | Status: DC

## 2022-03-14 MED ORDER — ACETAMINOPHEN 500 MG PO TABS
1000.0000 mg | ORAL_TABLET | Freq: Once | ORAL | Status: AC
  Administered 2022-03-14: 1000 mg via ORAL
  Filled 2022-03-14: qty 2

## 2022-03-14 NOTE — ED Notes (Signed)
Pt did not want to sit in exam bed, wants to stay in wheelchair.

## 2022-03-14 NOTE — ED Notes (Signed)
Pt provided Tylenol from earlier order which pt refused at time, consulted with hospitalist regarding pt requesting pain medication and advised to give Tylenol/NSAID due to pt's AKI, overrid in pyxis and provided.

## 2022-03-14 NOTE — Progress Notes (Signed)
Echocardiogram 2D Echocardiogram has been performed.  Oneal Deputy Hinda Lindor RDCS 03/14/2022, 1:34 PM

## 2022-03-14 NOTE — Consult Note (Signed)
[facility]  INPATIENT CONSULTATION  Reason for Consultation: AKI on CKD, volume overload Requesting Provider: Dr. Waldron Labs  HPI: Amber Cross is an 61 y.o. female with DM, HTN, SLCL s/p XRT (mid 2023), h/o CVA, h/o HFrEF (04/2021 EF 35%, 02/2022 EF 40-45%), CKD currently hospitalized with HTN, A/C HFrEF who nephrology is consulted regarding AKI on CKD and volume overload.   Followed by Dr. Joelyn Oms at [facility] and at f/u visit with NP yesterday she was noted to be HTN 180s and markedly volume overloaded.  She was recommended to present to the hospital for management and is seen in the [facility] ED where she presented yesterday afternoon.  She reports lack of diuretic response to outpt torsemide 100 daily.  She c/o DOE, orthopnea, edema, wheezing.  Denies f/c/chest pain/dysuria/LUTs.  BPs have been in the 160-190s here.  Labs have shown K 2.7 (being repleted), Cr 3.6 (from last 2.8 on 7/26), albumin 2.1, Hb 8.5 (from 8.7 on 7/9). Renal US shows no obstruction.  No recent contrast, denies NSAID use. Has rec'd lasix 40 IV x1 dose and has been urinating but no I/Os are documented.  Cardiology consulting, adjusting antiHTNs, defer diuresis to nephrology.  She's had a fairly precipitous decline in renal function over the past 1-1.5y with concurrent evidence for nephrotic syndrome.  In 04/2021 Cr was in the ~1.1 range now generally 3+ since 01/2022.  Recent UP/C in clinic 02/2022 was 17.9.  Serologic eval hasn't been revealing but this is out of proportion to typical diabetic kidney disease so a kidney biopsy has been scheduled for later this month.   Admitted 01/2022 for pseudogout with course complicated by AKI Cr peak  3.7. 10/2021 admitted x 2:  HTN, MRI acute punctate CVA, weakness;  2nd atypical CP 04/2021 admitted:  HFrEF 25%, diuresed.   PMH: Past Medical History:  Diagnosis Date   Anemia    Arthritis    CHF (congestive heart failure) ([place])    Chronic kidney disease    Depression     Diabetes mellitus    Headache    Hypercholesteremia    Hypertension    Pseudogout    Stroke ([place]) 10/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   Tobacco abuse    PSH: Past Surgical History:  Procedure Laterality Date   ANTERIOR CRUCIATE LIGAMENT REPAIR Right    BRONCHIAL BIOPSY  12/18/2021   Procedure: BRONCHIAL BIOPSIES;  Surgeon: Garner Nash, DO;  Location: [facility];  Service: Pulmonary;;   BRONCHIAL BRUSHINGS  12/18/2021   Procedure: BRONCHIAL BRUSHINGS;  Surgeon: Garner Nash, DO;  Location: [facility];  Service: Pulmonary;;   BRONCHIAL NEEDLE ASPIRATION BIOPSY  12/18/2021   Procedure: BRONCHIAL NEEDLE ASPIRATION BIOPSIES;  Surgeon: Garner Nash, DO;  Location: [facility];  Service: Pulmonary;;   CATARACT EXTRACTION     FIDUCIAL MARKER PLACEMENT  12/18/2021   Procedure: FIDUCIAL MARKER PLACEMENT;  Surgeon: Garner Nash, DO;  Location: [facility];  Service: Pulmonary;;   OVARY SURGERY     RIGHT OOPHORECTOMY Right    SMALL INTESTINE SURGERY     TUBAL LIGATION     VIDEO BRONCHOSCOPY WITH RADIAL ENDOBRONCHIAL ULTRASOUND  12/18/2021   Procedure: RADIAL ENDOBRONCHIAL ULTRASOUND;  Surgeon: Garner Nash, DO;  Location: [facility];  Service: Pulmonary;;    Past Medical History:  Diagnosis Date   Anemia    Arthritis    CHF (congestive heart failure) ([place])    Chronic kidney disease    Depression  Diabetes mellitus    Headache    Hypercholesteremia    Hypertension    Pseudogout    Stroke ([place]) 10/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   Tobacco abuse     Medications:  I have reviewed the patient's current medications.  (Not in a hospital admission)   ALLERGIES:   Allergies  Allergen Reactions   Penicillins Itching and Other (See Comments)    redness   Strawberry (Diagnostic) Itching and Swelling   Tomato Itching and Swelling    FAM HX: Family History  Problem Relation Age of Onset   Diabetes Mother    Hypertension Mother    Hypertension Father     Social History:    reports that she has been smoking cigarettes. She has a 20.00 pack-year smoking history. She has never used smokeless tobacco. She reports that she does not currently use alcohol. She reports current drug use. Frequency: 4.00 times per week. Drug: Marijuana.  ROS: 12 system ROS neg except per HPI above  Blood pressure (!) 177/77, pulse 84, temperature 98.7 F (37.1 C), resp. rate (!) 21, height 5' 7.5" (1.715 m), weight 64 kg, last menstrual period 09/29/2011, SpO2 (!) 88 %. PHYSICAL EXAM: Gen: chronically ill woman lying at 30 degrees on stretcher, uncomfortable but in no distress  Eyes: anicteric, EOMI ENT: poor dentition, MMM Neck: supple, JVD to mandible at 30 deg CV: RRR, no rub Abd: soft, mildly distended, no fluid wave appreciated Back: normal WOB, speaking in complete sentences, audible wheezing, rales in bases GU: no foley Extr: 2+ pitting edema to hips, LUE 1+ pitting, RUE normal Neuro: grossly nonfocal Skin: no rashes noted   Results for orders placed or performed during the hospital encounter of 03/13/22 (from the past 48 hour(s))  Brain natriuretic peptide     Status: Abnormal   Collection Time: 03/13/22  5:13 PM  Result Value Ref Range   B Natriuretic Peptide 438.8 (H) 0.0 - 100.0 pg/mL    Comment: Performed at [facility], [place] [address] [place], [place] [address]  CBC with Differential     Status: Abnormal   Collection Time: 03/13/22  5:13 PM  Result Value Ref Range   WBC 8.1 4.0 - 10.5 K/uL   RBC 2.99 (L) 3.87 - 5.11 MIL/uL   Hemoglobin 8.5 (L) 12.0 - 15.0 g/dL   HCT 26.4 (L) 36.0 - 46.0 %   MCV 88.3 80.0 - 100.0 fL   MCH 28.4 26.0 - 34.0 pg   MCHC 32.2 30.0 - 36.0 g/dL   RDW 15.9 (H) 11.5 - 15.5 %   Platelets 342 150 - 400 K/uL   nRBC 0.0 0.0 - 0.2 %   Neutrophils Relative % 80 %   Neutro Abs 6.5 1.7 - 7.7 K/uL   Lymphocytes Relative 14 %   Lymphs Abs 1.2 0.7 - 4.0 K/uL   Monocytes Relative 5 %   Monocytes Absolute 0.4 0.1 - 1.0  K/uL   Eosinophils Relative 1 %   Eosinophils Absolute 0.0 0.0 - 0.5 K/uL   Basophils Relative 0 %   Basophils Absolute 0.0 0.0 - 0.1 K/uL   Immature Granulocytes 0 %   Abs Immature Granulocytes 0.02 0.00 - 0.07 K/uL    Comment: Performed at [facility], [place] [address], [place], [place] [address]  Troponin I (High Sensitivity)     Status: Abnormal   Collection Time: 03/13/22  5:13 PM  Result Value Ref Range   Troponin I (High  Sensitivity) 19 (H) <18 ng/L    Comment: (NOTE) Elevated high sensitivity troponin I (hsTnI) values and significant  changes across serial measurements may suggest ACS but many other  chronic and acute conditions are known to elevate hsTnI results.  Refer to the "Links" section for chest pain algorithms and additional  guidance. Performed at [facility], [place] [address], [place], [place] [address]   Troponin I (High Sensitivity)     Status: Abnormal   Collection Time: 03/13/22 10:19 PM  Result Value Ref Range   Troponin I (High Sensitivity) 20 (H) <18 ng/L    Comment: (NOTE) Elevated high sensitivity troponin I (hsTnI) values and significant  changes across serial measurements may suggest ACS but many other  chronic and acute conditions are known to elevate hsTnI results.  Refer to the "Links" section for chest pain algorithms and additional  guidance. Performed at [facility], [place] [address] [place], [place] [address]   Comprehensive metabolic panel     Status: Abnormal   Collection Time: 03/13/22 10:19 PM  Result Value Ref Range   Sodium 141 135 - 145 mmol/L   Potassium 2.7 (LL) 3.5 - 5.1 mmol/L    Comment: CRITICAL RESULT CALLED TO, READ BACK BY AND VERIFIED WITH Daiva Eves, [place]ILL[place] EMTP @ 0125 03/14/2022 [place] K    Chloride 112 (H) 98 - 111 mmol/L   CO2 18 (L) 22 - 32 mmol/L   Glucose, Bld 221 (H) 70 - 99 mg/dL    Comment: Glucose reference range applies only to samples taken after fasting for  at least 8 hours.   BUN 37 (H) 6 - 20 mg/dL   Creatinine, Ser 3.59 (H) 0.44 - 1.00 mg/dL   Calcium 7.6 (L) 8.9 - 10.3 mg/dL   Total Protein 5.5 (L) 6.5 - 8.1 g/dL   Albumin 2.1 (L) 3.5 - 5.0 g/dL   AST 14 (L) 15 - 41 U/L   ALT 15 0 - 44 U/L   Alkaline Phosphatase 142 (H) 38 - 126 U/L   Total Bilirubin 0.3 0.3 - 1.2 mg/dL   GFR, Estimated 14 (L) >60 mL/min    Comment: (NOTE) Calculated using the CKD-EPI Creatinine Equation (2021)    Anion gap 11 5 - 15    Comment: Performed at [facility], [place] [address] [place], [place] [address]  SARS Coronavirus 2 by RT PCR (hospital order, performed in [facility] hospital lab) *cepheid single result test* Anterior Nasal Swab     Status: None   Collection Time: 03/14/22 12:51 AM   Specimen: Anterior Nasal Swab  Result Value Ref Range   SARS Coronavirus 2 by RT PCR NEGATIVE NEGATIVE    Comment: (NOTE) SARS-CoV-2 target nucleic acids are NOT DETECTED.  The SARS-CoV-2 RNA is generally detectable in upper and lower respiratory specimens during the acute phase of infection. The lowest concentration of SARS-CoV-2 viral copies this assay can detect is 250 copies / mL. A negative result does not preclude SARS-CoV-2 infection and should not be used as the sole basis for treatment or other patient management decisions.  A negative result may occur with improper specimen collection / handling, submission of specimen other than nasopharyngeal swab, presence of viral mutation(s) within the areas targeted by this assay, and inadequate number of viral copies (<250 copies / mL). A negative result must be combined with clinical observations, patient history, and epidemiological information.  Fact Sheet for Patients:   https://www.patel.info/  Fact Sheet for Healthcare Providers:  https://hall.com/  This test is not yet approved or  cleared by the [place] and has been authorized for  detection and/or diagnosis of SARS-CoV-2 by FDA under an Emergency Use Authorization (EUA).  This EUA will remain in effect (meaning this test can be used) for the duration of the COVID-19 declaration under Section 564(b)(1) of the Act, 21 U.S.C. section 360bbb-3(b)(1), unless the authorization is terminated or revoked sooner.  Performed at [facility], [place] [address], [place], [place] [address]   Magnesium     Status: None   Collection Time: 03/14/22  1:11 AM  Result Value Ref Range   Magnesium 1.7 1.7 - 2.4 mg/dL    Comment: Performed at [facility], [place] [address], [place], [place] [address]  Basic metabolic panel     Status: Abnormal   Collection Time: 03/14/22  5:32 AM  Result Value Ref Range   Sodium 142 135 - 145 mmol/L   Potassium 2.9 (L) 3.5 - 5.1 mmol/L   Chloride 112 (H) 98 - 111 mmol/L   CO2 22 22 - 32 mmol/L   Glucose, Bld 153 (H) 70 - 99 mg/dL    Comment: Glucose reference range applies only to samples taken after fasting for at least 8 hours.   BUN 37 (H) 6 - 20 mg/dL   Creatinine, Ser 3.64 (H) 0.44 - 1.00 mg/dL   Calcium 8.2 (L) 8.9 - 10.3 mg/dL   GFR, Estimated 14 (L) >60 mL/min    Comment: (NOTE) Calculated using the CKD-EPI Creatinine Equation (2021)    Anion gap 8 5 - 15    Comment: Performed at [facility], [place] [address], [place], [place] [address]  CBG monitoring, ED     Status: Abnormal   Collection Time: 03/14/22  8:26 AM  Result Value Ref Range   Glucose-Capillary 178 (H) 70 - 99 mg/dL    Comment: Glucose reference range applies only to samples taken after fasting for at least 8 hours.  CBG monitoring, ED     Status: Abnormal   Collection Time: 03/14/22 11:01 AM  Result Value Ref Range   Glucose-Capillary 66 (L) 70 - 99 mg/dL    Comment: Glucose reference range applies only to samples taken after fasting for at least 8 hours.  CBG monitoring, ED     Status: Abnormal   Collection Time:  03/14/22 11:43 AM  Result Value Ref Range   Glucose-Capillary 141 (H) 70 - 99 mg/dL    Comment: Glucose reference range applies only to samples taken after fasting for at least 8 hours.    US RENAL  Result Date: 03/14/2022 CLINICAL DATA:  61 year old female with acute renal insufficiency. EXAM: RENAL / URINARY TRACT ULTRASOUND COMPLETE COMPARISON:  Renal ultrasound 01/30/2022 and CT Chest, Abdomen, and Pelvis 11/06/2021. FINDINGS: Right Kidney: Renal measurements: 13.4 x 6.2 x 6.6 cm = volume: 289 mL. Echogenic right kidney (image 2), similar to last month. No solid right renal mass or hydronephrosis. Small benign upper pole cyst (no follow-up imaging recommended). Left Kidney: Renal measurements: 12.2 x 7.9 x 6.5 cm = volume: 328 mL. Echogenic left kidney similar to the last month. No solid left renal mass or hydronephrosis. Bladder: Appears normal for degree of bladder distention. Other: None. IMPRESSION: Chronic medical renal disease. No acute renal finding. Electronically Signed   By: Genevie Ann M.D.   On: 03/14/2022 07:17   DG Chest 2 View  Result Date: 03/13/2022 CLINICAL DATA:  Shortness of  breath EXAM: CHEST - 2 VIEW COMPARISON:  12/18/2021 FINDINGS: Cardiomegaly. Small bilateral pleural effusions. The visualized skeletal structures are unremarkable. IMPRESSION: Cardiomegaly and small bilateral pleural effusions. Electronically Signed   By: Delanna Ahmadi M.D.   On: 03/13/2022 17:08    Assessment/Plan: Amber Cross is an 61 y.o. female with DM, HTN, SLCL s/p XRT, h/o CVA, HFrEF, CKD currently hospitalized with HTN, A/C HFrEF who nephrology is consulted regarding AKI on CKD and volume overload.   **AKI on CKD with nephrotic syndrome:  fairly rapid progression of CKD over the past ~1 year with evidence of nephrotic syndrome.  Serologies have been unrevealing except mildly elevated K/L FLC ratio but SPEP no M spike.  Certainly could be diabetic kidney disease with her history but the course is  fairly rapid.  A kidney biopsy is planned for later in the month.  Once she's medically improved it should be considered while admitted.   Clearly volume overloaded in need of diuresis.  Avoid hypotension, nephrotoxins.  On prophylactic anticoagulation with heparin SQ.   **Acute on Chronic HFrEF:  cardiology following.  Therapies limited by GFR.  Diuresis needed -- start with lasix 80 IV BID and adjust as needed.  Strict I/Os, daily weights ordered, not being recorded yet.  Start purewick to assist.   Low na diet.    **Hypokalemia:  K 2.9 this AM, being repleted.   **Anemia: Hb 7-8s in 2023, MCV 88, iron sat 20% in 01/2022.  01/2022 SPEP neg M spike.  K/L FLC 3.4.  **HTN:  currently amlodipine 10, hydralazine 50 TID and diuresis  **DM:  longstanding poor control, recent A1c 9.7, insulin per primary. No RAASi or SGLT2i with GFR.   Nephrology will follow, call with questions/concerns.   Justin Mend 03/14/2022, 2:38 PM

## 2022-03-14 NOTE — ED Notes (Addendum)
Pt placed on 2 L/[place] due to SpO2 readings 86-88% with good waveform. Pt now reading 96%. Pt currently denies any shortness of breath, respiratory distress.

## 2022-03-14 NOTE — Consult Note (Addendum)
Cardiology Consultation:   Patient ID: Amber Cross MRN: [id number]; DOB: [birth year]  Admit date: 03/13/2022 Date of Consult: 03/14/2022  PCP:  [name], DO   [facility] Providers Cardiologist:  Berniece Salines, DO    Patient Profile:   Amber Cross is a 61 y.o. female with a history of chronic atypical chest pain, non-ischemic cardiomyopathy/ chronic combined CHF with recovered EF to 55-60% on Echo in 10/2021, hypertension, hyperlipidemia, type 2 diabetes, CKD stage IV, CVA in 10/2021, small cell lung cancer and ongoing tobacco abuse who is being seen 03/14/2022 for the evaluation of CHF at the request of Dr. Waldron Labs.  History of Present Illness:   Amber Cross is a 61 year old female with the above history and has been following with both Dr. Harriet Masson. Patient was first seen by Dr. Gershon Mussel during admission in 04/2021 for acute CHF.  Echo at that time showed LVEF of 25% with global hypokinesis and grade 2 diastolic dysfunction.  There were also findings suggestive of infiltrative disease/amyloidosis.  PYP study was done which was equivocal for transthyretin amyloidosis.  Improved to 40-45% on repeat echo that admission.  Recent stress test in 12/2020 at [facility] showed no ischemia or prior infarction so no additional ischemic work-up was pursued.  Cardiomyopathy was felt to be nonischemic in nature.  Cardiac MRI in 08/2021 at [facility] showed no evidence of infiltrative disease and mildly decreased LVEF of 48%.  Patient was admitted twice in 10/2021 for chest pain.  High-sensitivity troponin during first admission peaked at 27. Echo showed LVEF of 55-60% with severe concentric LVH and grade 1 diastolic dysfunction, normal RV, moderate lead dilated left atrium, and trivial pericardial effusion.  Symptoms to be musculoskeletal in nature no additional ischemic evaluation was pursued.  She was admitted again a couple weeks later after presenting with severe hypertension and chest pain/epigastric pain.   Brain MRI was positive for punctate acute/subacute white matter infarct of the left superior frontal gyrus.  High sensitive troponin was minimally elevated and flat in the 30s not consistent with ACS.  Right upper quadrant ultrasound was unremarkable.  Chest pain and epigastric pain were felt to likely be musculoskeletal in nature as it was reproducible with palpation.  No additional cardiac work-up was pursued.  Of note, cardiology was not consulted during either of these admissions.  Patient was last seen by Dr. Harriet Masson in 11/2021 at which time she denies any chest pain or shortness of breath but reported fatigue and chronic diarrhea.  Since his visit, she was recently admitted from 01/27/2022 to 02/03/2022 after presenting with worsening and persistent numbness/tingling of the left side of her body.  She was also found to have acute on chronic CKD at that time with creatinine of 2.63.  Head CT/brain MRI showed no acute intracranial abnormality and expected evolution of the superior left frontal lobe white matter lacune. MRI of lumbar spine showed no acute abnormalities and no evidence of metastatic disease but did note mild multilevel lumbar spondylosis and small right paracentral disc protrusion at L4-L5 encroaching on the descending right L5 V nerve root.  Renal artery ultrasound showed no evidence of renal artery stenosis.  Nephrology was consulted for AKI and this was felt to be largely hemodynamically mediated from diabetes and hypertension.  Nephrology recommended discontinuing ARB at discharge and possibly restarting if renal function recovers.  Patient presented to the [facility] long ED on 03/13/2022 at the advice of her PCP for further evaluation of increased lower extremity edema and shortness of breath.  Patient states these symptoms have been getting progressively worse since her last admission last month.  She states she was swollen in the hospital and has chest progress since then.  She has shortness of  breath both at rest and with walking short distances.  She gets noticeably short of breath while talking to me at times.  She also describes orthopnea.  She denies any real PND but she states this is because she is scared to go to sleep.  She has significant lower extremity edema extending up to her thighs as well as abdominal distention.  She reports early satiety with this. She states her Nephrologist (Dr. Joelyn Oms) has been increasing her Lasix but she has had no improvement with this. She also reports constant chest that she describes as a squeezing sensation and states it feels like "her bra is on too tight."  She states this is the same chest pain that she has had for months now.  It is there all day.  Does not seem to be worse with exertion.  She notes that occasional palpitation that she described as a skipped beat but no prolonged or sustained palpitations.  No lightheadedness, dizziness, or near syncope.  She has a nonproductive cough but denies any recent fevers or illnesses.  She notes some vomiting during coughing fits but otherwise no GI symptoms.  No abnormal bleeding in urine or stools.  She does have chronic diarrhea but this is not new.  Upon arrival to the ED, patient markedly hypertensive with systolic BP over 916 and hypoxic with O2 sats of 86-88%.  on room air EKG showed normal sinus rhythm, rate 81 bpm, with underlying artifact but no acute ST/T changes and prolonged QTc of 528 ms.  High-sensitivity troponin borderline at 19 >> 20 consistent with ACS.  BNP elevated at 438.  Chest x-ray showed cardiomegaly and small bilateral pleural effusions. WBC 8.1, Hgb 8.5, Plts 342. Na 141, K 2.7, Glucose 221, CO2 18, BUN 37, Cr 3.59. Albumin 2.1, AST 14, ALT 15, Alk Phos 142, Total Bili 0.3.  COVID 19 PCR negative.  She was given a dose of IV Lasix 40 mg and received a DuoNeb treatment as well as potassium supplementation and was admitted for further management of anasarca.  Cardiology consulted for  assistance.  On the time of this evaluation, patient is noticeably short of breath at times while talking with me.  He reports 7 out of 10 chest pain at this time does not appear to be in any distress.  She was tearful at times during my conversation with her. She has mild tenderness with palpation of chest wall as well as palpitation of epigastric area. This sounds similar to her symptoms during admission in 10/2021. Of note, she has a sausage biscuit at bedside with over 1,200 mg of sodium.  She was unaware that she should be limiting her sodium intake to less than 2000 mg a day though suspect this is contributing to her marked volume overload.  Past Surgical History:  Procedure Laterality Date   ANTERIOR CRUCIATE LIGAMENT REPAIR Right    BRONCHIAL BIOPSY  12/18/2021   Procedure: BRONCHIAL BIOPSIES;  Surgeon: Garner Nash, DO;  Location: [place] ENDOSCOPY;  Service: Pulmonary;;   BRONCHIAL BRUSHINGS  12/18/2021   Procedure: BRONCHIAL BRUSHINGS;  Surgeon: Garner Nash, DO;  Location: [place] ENDOSCOPY;  Service: Pulmonary;;   BRONCHIAL NEEDLE ASPIRATION BIOPSY  12/18/2021   Procedure: BRONCHIAL NEEDLE ASPIRATION BIOPSIES;  Surgeon: Garner Nash, DO;  Location:  [place] ENDOSCOPY;  Service: Pulmonary;;   CATARACT EXTRACTION     FIDUCIAL MARKER PLACEMENT  12/18/2021   Procedure: FIDUCIAL MARKER PLACEMENT;  Surgeon: Garner Nash, DO;  Location: [place] ENDOSCOPY;  Service: Pulmonary;;   OVARY SURGERY     RIGHT OOPHORECTOMY Right    SMALL INTESTINE SURGERY     TUBAL LIGATION     VIDEO BRONCHOSCOPY WITH RADIAL ENDOBRONCHIAL ULTRASOUND  12/18/2021   Procedure: RADIAL ENDOBRONCHIAL ULTRASOUND;  Surgeon: Garner Nash, DO;  Location: [place];  Service: Pulmonary;;     Home Medications:  Prior to Admission medications   Medication Sig Start Date End Date Taking? Authorizing Provider  acetaminophen (TYLENOL) 500 MG tablet Take 1,000 mg by mouth every 6 (six) hours as needed for mild pain.    [provider]  albuterol (VENTOLIN HFA) 108 (90 Base) MCG/ACT inhaler Inhale 2 puffs into the lungs every 6 (six) hours as needed for wheezing or shortness of breath. 02/28/22   Farrel Gordon, DO  amLODipine (NORVASC) 10 MG tablet Take 1 tablet (10 mg total) by mouth daily. 02/04/22   Lacinda Axon, MD  aspirin EC 81 MG tablet Take 1 tablet (81 mg total) by mouth daily. Swallow whole. 02/28/22   Farrel Gordon, DO  Bayer Microlet Lancets lancets Check blood sugar up to 3 times a day as instructed 04/06/19   Katherine Roan, MD  carvedilol (COREG) 25 MG tablet Take 2 tablets (50 mg total) by mouth 2 (two) times daily with a meal. 02/28/22   Farrel Gordon, DO  Continuous Blood Gluc Receiver ([place]) [place] 1 each by Does not apply route daily at 8 pm. 02/07/22   [name], DO  Continuous Blood Gluc Sensor (DEXCOM G7 SENSOR) MISC Apply 1 each topically every 14 (fourteen) days. 02/07/22   [name], DO  DULoxetine (CYMBALTA) 60 MG capsule Take 1 capsule (60 mg total) by mouth daily. 11/24/21   Rosezetta Schlatter, MD  furosemide (LASIX) 80 MG tablet Take 1 tablet (80 mg total) by mouth 2 (two) times daily for 14 days. 02/20/22 03/06/22  [name], DO  gabapentin (NEURONTIN) 300 MG capsule Take 1 capsule (300 mg total) by mouth daily. Patient taking differently: Take 300 mg by mouth at bedtime. 11/23/21 01/28/22  Rosezetta Schlatter, MD  glucose blood test strip Check blood sugar up to 3 times a day as instructed 12/11/21   Iona Beard, MD  hydrALAZINE (APRESOLINE) 50 MG tablet Take 1 tablet (50 mg total) by mouth every 8 (eight) hours. 02/03/22   Lacinda Axon, MD  hydrOXYzine (ATARAX) 25 MG tablet Take 1 tablet (25 mg total) by mouth at bedtime. 02/03/22   Lacinda Axon, MD  insulin isophane & regular human KwikPen (NOVOLIN 70/30 KWIKPEN) (70-30) 100 UNIT/ML KwikPen Inject 15 Units into the skin in the morning AND 10 Units every evening. 02/28/22   Farrel Gordon, DO  Insulin Pen Needle (UNIFINE  PENTIPS) 32G X 4 MM MISC Use in the morning and at bedtime 12/11/21 12/06/22  Iona Beard, MD  Multiple Vitamin (MULTI-VITAMIN) tablet Take 1 tablet by mouth daily.    [provider]  nitroGLYCERIN (NITROSTAT) 0.4 MG SL tablet Place 0.4 mg under the tongue every 5 (five) minutes as needed for chest pain. 10/01/21   [provider]  pantoprazole (PROTONIX) 40 MG tablet Take 1 tablet (40 mg total) by mouth daily. 12/11/21 01/28/22  Iona Beard, MD  rosuvastatin (CRESTOR) 40 MG tablet Take 1 tablet (  40 mg total) by mouth daily. 11/24/21   Rosezetta Schlatter, MD  sucralfate (CARAFATE) 1 g tablet Take 1 tablet (1 g total) by mouth 4 (four) times daily -  with meals and at bedtime. Patient taking differently: Take 1 g by mouth 2 (two) times daily. 11/23/21   Rosezetta Schlatter, MD  Vitamin D, Ergocalciferol, (DRISDOL) 1.25 MG (50000 UNIT) CAPS capsule Take 1 capsule (50,000 Units total) by mouth every 7 (seven) days. 02/07/22   Lacinda Axon, MD    Inpatient Medications: Scheduled Meds:  heparin  5,000 Units Subcutaneous Q8H   insulin aspart  0-5 Units Subcutaneous QHS   insulin aspart  0-9 Units Subcutaneous TID WC   potassium chloride  40 mEq Oral Q6H   Continuous Infusions:  PRN Meds: hydrALAZINE  Allergies:    Allergies  Allergen Reactions   Penicillins Itching and Other (See Comments)    redness   Strawberry (Diagnostic) Itching and Swelling   Tomato Itching and Swelling    Social History:   Social History   Socioeconomic History   Marital status: Married    Spouse name: Not on file   Number of children: Not on file   Years of education: Not on file   Highest education level: Not on file  Occupational History   Not on file  Tobacco Use   Smoking status: Every Day    Packs/day: 0.50    Years: 40.00    Total pack years: 20.00    Types: Cigarettes   Smokeless tobacco: Never  Vaping Use   Vaping Use: Never used  Substance and Sexual Activity   Alcohol  use: Not Currently   Drug use: Yes    Frequency: 4.0 times per week    Types: Marijuana    Comment: smokes every other day   Sexual activity: Not Currently    Birth control/protection: Abstinence  Other Topics Concern   Not on file  Social History Narrative   Not on file   Social Determinants of Health   Financial Resource Strain: Medium Risk (02/28/2022)   Overall Financial Resource Strain (CARDIA)    Difficulty of Paying Living Expenses: Somewhat hard  Food Insecurity: Not on file  Transportation Needs: Unmet Transportation Needs (02/28/2022)   PRAPARE - Transportation    Lack of Transportation (Medical): No    Lack of Transportation (Non-Medical): Yes  Physical Activity: Not on file  Stress: Stress Concern Present (02/28/2022)   [place]    Feeling of Stress : To some extent  Social Connections: Not on file  Intimate Partner Violence: Not on file    Family History:   Family History  Problem Relation Age of Onset   Diabetes Mother    Hypertension Mother    Hypertension Father      ROS:  Please see the history of present illness.  Review of Systems  Constitutional:  Negative for chills and fever.  HENT:  Negative for congestion.   Respiratory:  Positive for cough and shortness of breath. Negative for sputum production.   Cardiovascular:  Positive for chest pain, palpitations, orthopnea and leg swelling.  Gastrointestinal:  Positive for abdominal pain and diarrhea. Negative for blood in stool, melena and nausea.  Genitourinary:  Negative for hematuria.  Musculoskeletal:  Negative for myalgias.  Neurological:  Negative for dizziness and loss of consciousness.  Endo/Heme/Allergies:  Does not bruise/bleed easily.  Psychiatric/Behavioral:  Positive for substance abuse (tobacco abuse).    Physical Exam/Data:  Vitals:   03/14/22 0530 03/14/22 0645 03/14/22 0646 03/14/22 0930  BP: (!) 180/78 (!) 190/85 (!)  190/85 (!) 166/78  Pulse: 80  83 80  Resp: _0 Temp:    98.1 F (36.7 C)  TempSrc:    Oral  SpO2: 96%  95% 94%  Weight:      Height:        Intake/Output Summary (Last 24 hours) at 03/14/2022 1020 Last data filed at 03/14/2022 0758 Gross per 24 hour  Intake 89.45 ml  Output --  Net 89.45 ml      03/13/2022    4:22 PM 02/28/2022    3:29 PM 02/20/2022   11:06 AM  Last 3 Weights  Weight (lbs) 141 lb 147 lb 11.2 oz 143 lb  Weight (kg) 63.957 kg 66.996 kg 64.864 kg     Body mass index is 21.76 kg/m.  General: 61 y.o. African-American female  in no acute distress. HEENT: Normocephalic and atraumatic. Sclera clear.  Neck: Supple. JVD elevated to ear. Heart: RRR. Distinct S1 and S2. No murmurs, gallops, or rubs. Radial pulses 2+ and equal bilaterally. Lungs: No increased work of breathing. Clear to ausculation bilaterally. No wheezes, rhonchi, or rales.  Abdomen: Soft, mildly distended, and mildly tender to palpation of epigastric region. Bowel sounds present. Extremities: 3+ pitting edema of bilateral lower extremities extending up to her thighs.   Skin: Warm and dry. Neuro: Alert and oriented x3. No focal deficits. Psych: Normal affect. Responds appropriately.  EKG:  The EKG was personally reviewed and demonstrates:  normal sinus rhythm, rate 81 bpm, with underlying artifact but no acute ST/T changes and prolonged QTc of 528 ms. Telemetry:  Telemetry was personally reviewed and demonstrates: Normal sinus rhythm with rates in the 70s to 80s.  Relevant CV Studies:  Cardiac MRI 09/21/2021 ([facility]): Findings: -Left ventricle: Normal size and wall thickness.  -Right ventricle: Normal size and wall thickness. No aneurysm or fibrofatty infiltration. Ejection fraction 68 %  -Left atrium: Normal size. Normal without filling defect. Left atrial appendage normal.  -Right atrium: Normal size. No filling defect  -Pericardium: Normal pericardial thickness. No effusion or masses.   -Wall motion/ejection fraction: Normal wall motion. Ejection fraction 48 %  -Aortic valve: No gross abnormalities. No regurgitant or stenotic flow jets.  -Mitral valve: No gross abnormalities. No regurgitant or stenotic flow jets.  -Tricuspid valve: No gross abnormalities. No regurgitant or stenotic flow jets.  -Pulmonic valve: No gross abnormalities. No regurgitant or stenotic flow jets.  -Myocardial edema: No myocardial edema.  -First-pass perfusion: Normal. No subendocardial defects to suggest ischemia or infarct.  -Delayed myocardial enhancement: None  -Incidental findings: Small pericardial effusion.   Impressions: No infiltrative disease identified. Decreased left ventricular ejection fraction  _______________  Echocardiogram 11/08/2021: Impressions:  1. The LV endocardium has a bright speckled appearence.   2. The patient is followed at [facility] . Cardiac MRI 09/21/21 at  [facility] revealed no evidence of amyloidosis.   3. There is severe concentric LVH with diastolic dysfunction and small  pericardial effusion.   4. Left ventricular ejection fraction, by estimation, is 55 to 60%. The  left ventricle has normal function. The left ventricle has no regional  wall motion abnormalities. There is severe concentric left ventricular  hypertrophy. Left ventricular diastolic   parameters are consistent with Grade I diastolic dysfunction (impaired  relaxation).   5. Right ventricular systolic function is normal. The right ventricular  size is normal. There is normal  pulmonary artery systolic pressure.   6. Left atrial size was moderately dilated.   7. There is no evidence of cardiac tamponade.   8. The mitral valve is normal in structure. Trivial mitral valve  regurgitation.   9. The aortic valve is normal in structure. Aortic valve regurgitation is  not visualized.  _______________  Renal Artery Ultrasound 02/03/2022: Summary:  Renal:  Right: Abnormal right Resistive Index.  No evidence of right renal         artery stenosis. RRV flow present. Previously noted cyst         identified on today's examination. Right kidney length >13         cm.  Left:  Abnormal left Resisitve Index. No evidence of left renal         artery stenosis. LRV flow present.   Mesenteric:  Elevated celiac artery velocities without evidence of focal stenosis.  Laboratory Data:  High Sensitivity Troponin:   Recent Labs  Lab 03/13/22 1713 03/13/22 2219  TROPONINIHS 19* 20*     Chemistry Recent Labs  Lab 03/13/22 2219 03-30-22 0111 March 30, 2022 0532  NA 141  --  142  K 2.7*  --  2.9*  CL 112*  --  112*  CO2 18*  --  22  GLUCOSE 221*  --  153*  BUN 37*  --  37*  CREATININE 3.59*  --  3.64*  CALCIUM 7.6*  --  8.2*  MG  --  1.7  --   GFRNONAA 14*  --  14*  ANIONGAP 11  --  8    Recent Labs  Lab 03/13/22 2219  PROT 5.5*  ALBUMIN 2.1*  AST 14*  ALT 15  ALKPHOS 142*  BILITOT 0.3   Lipids No results for input(s): "CHOL", "TRIG", "HDL", "LABVLDL", "LDLCALC", "CHOLHDL" in the last 168 hours.  Hematology Recent Labs  Lab 03/13/22 1713  WBC 8.1  RBC 2.99*  HGB 8.5*  HCT 26.4*  MCV 88.3  MCH 28.4  MCHC 32.2  RDW 15.9*  PLT 342   Thyroid No results for input(s): "TSH", "FREET4" in the last 168 hours.  BNP Recent Labs  Lab 03/13/22 1713  BNP 438.8*    DDimer No results for input(s): "DDIMER" in the last 168 hours.   Radiology/Studies:  US RENAL  Result Date: 03-30-22 CLINICAL DATA:  61 year old female with acute renal insufficiency. EXAM: RENAL / URINARY TRACT ULTRASOUND COMPLETE COMPARISON:  Renal ultrasound 01/30/2022 and CT Chest, Abdomen, and Pelvis 11/06/2021. FINDINGS: Right Kidney: Renal measurements: 13.4 x 6.2 x 6.6 cm = volume: 289 mL. Echogenic right kidney (image 2), similar to last month. No solid right renal mass or hydronephrosis. Small benign upper pole cyst (no follow-up imaging recommended). Left Kidney: Renal measurements: 12.2 x 7.9 x 6.5  cm = volume: 328 mL. Echogenic left kidney similar to the last month. No solid left renal mass or hydronephrosis. Bladder: Appears normal for degree of bladder distention. Other: None. IMPRESSION: Chronic medical renal disease. No acute renal finding. Electronically Signed   By: Genevie Ann M.D.   On: Mar 30, 2022 07:17   DG Chest 2 View  Result Date: 03/13/2022 CLINICAL DATA:  Shortness of breath EXAM: CHEST - 2 VIEW COMPARISON:  12/18/2021 FINDINGS: Cardiomegaly. Small bilateral pleural effusions. The visualized skeletal structures are unremarkable. IMPRESSION: Cardiomegaly and small bilateral pleural effusions. Electronically Signed   By: Delanna Ahmadi M.D.   On: 03/13/2022 17:08     Assessment and Plan:   Anasarca Chronic Combined  CHF with Normalization of EF Patient has a history of chronic combined CHF with a EF as low as 25% in 04/2021 but has since normalized.  Last echo in/2023 showed LVEF of 55-60% with severe concentric LVH and grade 1 diastolic dysfunction, normal RV, moderately dilated left atrium. There has been concern for amyloidosis in the past given bright speckled appearance of LV endocardium and other Echo findings. However, PYP scan in 04/2021 was equivocal for transthyretin amyloidosis and cardiac MRI in 08/2021 at [facility] showed no evidence of infiltrative disease.  Cardiomyopathy has been felt to be non-ischemic in the past given prior stress test in 12/2020 showed no evidence of ischemia or prior infarct.  Patient now presents with worsening shortness of breath and lower extremity edema and was found to be significantly volume overloaded on presentation.  BNP elevated at 438.  Chest x-ray shows cardiomegaly and small bilateral pleural effusions.  She was given 1 dose of IV Lasix 40 mg in the ED with no documented urinary output.  Creatinine 3.59 and potassium 2.7 on admission. - Patient is markedly volume overloaded on exam. - Will order repeat limited Echo to reassess LV function. -  Given current renal function, I would defer diuresis to Nephrology. - Will hold off on resuming beta-blocker for now in the setting of decompensated CHF and discussed with MD first. - No ACEi/ARB/ARNI or MRA due to renal function. - No SGLT2 inhibitor right now due to renal function with GFR of 14. - Continue to monitor daily weights, strict I's and O's, and renal function. I think dietary compliance is an issue here. Discussed the importance of limiting sodium intake to <2014m a day.  Chest Pain Patient has constant chest pain that she describes as a squeezing sensation. She has had this for months and it is unchanged. She has this 24/7. Not worse with exertion. EKG shows no acute ischemic changes.  High-sensitivity troponin 19 >> 28.   - She continues to have 7/10 pain now but does not appear to be in any distress. - Symptoms very atypical. Do not think this is cardiac in nature. No ischemic work-up necessary at this time. Symptoms may improve with management of fluid status.  Hypertensive Urgency BP as high as 207/90 on admission.  Home regimen includes Amlodipine 10 mg daily, Coreg 25 mg daily, Hydralazine 50 mg three times daily.  Most recent BP improved with systolic BP in the 1063K - Will restart Amlodipine 198mdaily and Hydralazine 5021mhree time daily. - Will hold off on restarting Coreg at this time given decompensated CHF.  Hyperlipidemia - Continue home Crestor 105m54mily.  Acute on CKD Stage IV Creatinine 3.59 on admission and is 3.64 today. Creatinine during recent admission last month peaked at 3.66 but then improved. Last creatinine on 02/20/2022 was 2.8. Renal artery ultrasound last month showed no evidence of renal artery stenosis. - Continue to avoid nephrotoxic agents. - Agree with Nephrology referral. Would defer diuresis to them.  Hypokalemia Potassium 2.7 on arrival and 2.9 this morning. Patient reports chronic diarrhea which may be the cause. - Continue  supplementation per primary team.  Prolonged QTc QTc 528 ms on initial EKG and 504 ms on repeat EKG today. This is in the setting of significant hypokalemia as above. Magnesium 1.7. - Continue to supplement potassium. - Repeat EKG tomorrow morning.  Otherwise, per primary team: - Small cell lung cancer - Poorly controlled type 2 diabetes mellitus - Metabolic acidosis - CVA - Chronic anemia  Risk Assessment/Risk Scores:   New York Heart Association (NYHA) Functional Class NYHA Class IV   For questions or updates, please contact [facility] Please consult [email or web address] for contact info under    Signed, Darreld Mclean, PA-C  03/14/2022 10:20 AM   Patient seen and examined.  Agree with above documentation.  Amber Cross is a 61 year old female with a history of chronic combined heart failure, CKD stage IV, CVA, small cell lung cancer, tobacco use, hypertension who we are consulted for evaluation of heart failure at the request of Dr. Waldron Labs.  Echocardiogram 04/2021 showed LVEF 25%.  Underwent a PYP study which was equivocal for amyloidosis.  Cardiac MRI 08/2021 at [facility] showed no evidence of amyloidosis, EF 48%.  Most recent echocardiogram 10/2021 showed EF had improved to 55 to 60%, severe LVH, normal RV function.  She presents with worsening shortness of breath and lower extremity edema.  In the ED, initial vital signs notable for BP 180/75, SPO2 97% on room air, pulse 83.  Labs notable for creatinine 3.64, potassium 2.9, troponin 19 > 20, BNP 439, hemoglobin 8.5, platelets 242, WBC 8.1.  Chest x-ray with cardiomegaly and small bilateral pleural effusions.  EKG shows normal sinus rhythm, rate 82, less than 1 mm ST depressions.  On exam, patient is alert and oriented, regular rate and rhythm, no murmurs, diminished breath sounds, 2+ BLE edema, + JVD.  For her acute on chronic combined heart failure, she appears significantly volume overloaded.  Given tenuous renal function, agree  with nephrology consult.  Would defer to nephrology on diuresis.  Will check echocardiogram.  Donato Heinz, MD

## 2022-03-14 NOTE — H&P (Signed)
History and Physical    Amber Cross HYQ:[id number] DOB: [birth year] DOA: 03/13/2022  PCP: [name], DO  Patient coming from: Home  Chief Complaint: Shortness of breath  HPI: Amber Cross is a 61 y.o. 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  Admitted a month ago for CPPD arthritis of left knee and AKI.  Had a follow-up visit with PCP on 8/3 and referred to nephrology due to concern for nephrotic range proteinuria and volume overload.  Patient presents to the ED with complaints of shortness of breath, chest pain, cough, and bilateral lower extremity edema.  In the ED, hypertensive with systolic above 952.  SPO2 86-88% on room air and placed on 2 L supplemental oxygen.  Labs showing no leukocytosis.  Hemoglobin 8.5, stable.  Potassium 2.7, magnesium 1.7, bicarb 18, BUN 37, creatinine 3.5, glucose 221, calcium 7.6, total protein 5.5, albumin 2.1.  Alkaline phosphatase 142, no elevation of remainder of LFTs.  BNP 438.  High-sensitivity troponin 19 > 20.  SARS-CoV-2 PCR negative.  Chest x-ray showing cardiomegaly and small bilateral pleural effusions. Medications administered include IV Lasix 40 mg, DuoNeb treatment, nitroglycerin ointment, and oral potassium.  Patient reports progressively worsening dyspnea on exertion, cough, orthopnea, and bilateral lower extremity edema extending up to her abdomen.  States she was seen by nephrology a few weeks ago and started on furosemide 100 mg daily and has been taking it regularly.  Also reports taking her blood pressure medications including carvedilol, amlodipine, and hydralazine regularly.  She reports ongoing substernal, nonexertional chest pain for several months.  Denies fevers or chills.  No other complaints.  Review of Systems:  Review of Systems  All other systems reviewed and are negative.   Past Medical History:   Diagnosis Date   Anemia    Arthritis    CHF (congestive heart failure) ([place])    Chronic kidney disease    Depression    Diabetes mellitus    Headache    Hypercholesteremia    Hypertension    Pseudogout    Stroke ([place]) 10/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   Tobacco abuse     Past Surgical History:  Procedure Laterality Date   ANTERIOR CRUCIATE LIGAMENT REPAIR Right    BRONCHIAL BIOPSY  12/18/2021   Procedure: BRONCHIAL BIOPSIES;  Surgeon: Garner Nash, DO;  Location: [facility];  Service: Pulmonary;;   BRONCHIAL BRUSHINGS  12/18/2021   Procedure: BRONCHIAL BRUSHINGS;  Surgeon: Garner Nash, DO;  Location: [facility];  Service: Pulmonary;;   BRONCHIAL NEEDLE ASPIRATION BIOPSY  12/18/2021   Procedure: BRONCHIAL NEEDLE ASPIRATION BIOPSIES;  Surgeon: Garner Nash, DO;  Location: [facility];  Service: Pulmonary;;   CATARACT EXTRACTION     FIDUCIAL MARKER PLACEMENT  12/18/2021   Procedure: FIDUCIAL MARKER PLACEMENT;  Surgeon: Garner Nash, DO;  Location: [facility];  Service: Pulmonary;;   OVARY SURGERY     RIGHT OOPHORECTOMY Right    SMALL INTESTINE SURGERY     TUBAL LIGATION     VIDEO BRONCHOSCOPY WITH RADIAL ENDOBRONCHIAL ULTRASOUND  12/18/2021   Procedure: RADIAL ENDOBRONCHIAL ULTRASOUND;  Surgeon: Garner Nash, DO;  Location: [place];  Service: Pulmonary;;     reports that she has been smoking cigarettes. She has a 20.00 pack-year smoking history. She has never used smokeless tobacco. She reports that she does not currently use alcohol. She reports current drug use. Frequency:  4.00 times per week. Drug: Marijuana.  Allergies  Allergen Reactions   Penicillins Itching and Other (See Comments)    redness   Strawberry (Diagnostic) Itching and Swelling   Tomato Itching and Swelling    Family History  Problem Relation Age of Onset   Diabetes Mother    Hypertension Mother    Hypertension Father     Prior to Admission medications   Medication Sig Start  Date End Date Taking? Authorizing Provider  acetaminophen (TYLENOL) 500 MG tablet Take 1,000 mg by mouth every 6 (six) hours as needed for mild pain.    [provider]  albuterol (VENTOLIN HFA) 108 (90 Base) MCG/ACT inhaler Inhale 2 puffs into the lungs every 6 (six) hours as needed for wheezing or shortness of breath. 02/28/22   Farrel Gordon, DO  amLODipine (NORVASC) 10 MG tablet Take 1 tablet (10 mg total) by mouth daily. 02/04/22   Lacinda Axon, MD  aspirin EC 81 MG tablet Take 1 tablet (81 mg total) by mouth daily. Swallow whole. 02/28/22   Farrel Gordon, DO  Bayer Microlet Lancets lancets Check blood sugar up to 3 times a day as instructed 04/06/19   Katherine Roan, MD  carvedilol (COREG) 25 MG tablet Take 2 tablets (50 mg total) by mouth 2 (two) times daily with a meal. 02/28/22   Farrel Gordon, DO  Continuous Blood Gluc Receiver ([place]) [place] 1 each by Does not apply route daily at 8 pm. 02/07/22   [name], DO  Continuous Blood Gluc Sensor (DEXCOM G7 SENSOR) MISC Apply 1 each topically every 14 (fourteen) days. 02/07/22   [name], DO  DULoxetine (CYMBALTA) 60 MG capsule Take 1 capsule (60 mg total) by mouth daily. 11/24/21   Rosezetta Schlatter, MD  furosemide (LASIX) 80 MG tablet Take 1 tablet (80 mg total) by mouth 2 (two) times daily for 14 days. 02/20/22 03/06/22  [name], DO  gabapentin (NEURONTIN) 300 MG capsule Take 1 capsule (300 mg total) by mouth daily. Patient taking differently: Take 300 mg by mouth at bedtime. 11/23/21 01/28/22  Rosezetta Schlatter, MD  glucose blood test strip Check blood sugar up to 3 times a day as instructed 12/11/21   Iona Beard, MD  hydrALAZINE (APRESOLINE) 50 MG tablet Take 1 tablet (50 mg total) by mouth every 8 (eight) hours. 02/03/22   Lacinda Axon, MD  hydrOXYzine (ATARAX) 25 MG tablet Take 1 tablet (25 mg total) by mouth at bedtime. 02/03/22   Lacinda Axon, MD  insulin isophane & regular human KwikPen (NOVOLIN 70/30  KWIKPEN) (70-30) 100 UNIT/ML KwikPen Inject 15 Units into the skin in the morning AND 10 Units every evening. 02/28/22   Farrel Gordon, DO  Insulin Pen Needle (UNIFINE PENTIPS) 32G X 4 MM MISC Use in the morning and at bedtime 12/11/21 12/06/22  Iona Beard, MD  Multiple Vitamin (MULTI-VITAMIN) tablet Take 1 tablet by mouth daily.    [provider]  nitroGLYCERIN (NITROSTAT) 0.4 MG SL tablet Place 0.4 mg under the tongue every 5 (five) minutes as needed for chest pain. 10/01/21   [provider]  pantoprazole (PROTONIX) 40 MG tablet Take 1 tablet (40 mg total) by mouth daily. 12/11/21 01/28/22  Iona Beard, MD  rosuvastatin (CRESTOR) 40 MG tablet Take 1 tablet (40 mg total) by mouth daily. 11/24/21   Rosezetta Schlatter, MD  sucralfate (CARAFATE) 1 g tablet Take 1 tablet (1 g total) by mouth 4 (four) times daily -  with  meals and at bedtime. Patient taking differently: Take 1 g by mouth 2 (two) times daily. 11/23/21   Rosezetta Schlatter, MD  Vitamin D, Ergocalciferol, (DRISDOL) 1.25 MG (50000 UNIT) CAPS capsule Take 1 capsule (50,000 Units total) by mouth every 7 (seven) days. 02/07/22   Lacinda Axon, MD    Physical Exam: Vitals:   03/14/22 0045 03/14/22 0104 03/14/22 0144 03/14/22 0300  BP: (!) 207/90 (!) 204/85 (!) 184/83 (!) 196/84  Pulse: 79 82 79 83  Resp: 18 18 (!) 23 20  Temp: 97.6 F (36.4 C)     TempSrc: Oral     SpO2: 98% 98% 93% 96%  Weight:      Height:        Physical Exam Vitals reviewed.  Constitutional:      General: She is not in acute distress. HENT:     Head: Normocephalic and atraumatic.  Eyes:     Extraocular Movements: Extraocular movements intact.  Cardiovascular:     Rate and Rhythm: Normal rate and regular rhythm.     Pulses: Normal pulses.  Pulmonary:     Effort: Pulmonary effort is normal. No respiratory distress.     Breath sounds: No wheezing.  Abdominal:     General: Bowel sounds are normal. There is distension.     Palpations: Abdomen  is soft.     Tenderness: There is no abdominal tenderness. There is no guarding.  Musculoskeletal:     Cervical back: Normal range of motion.     Right lower leg: Edema present.     Left lower leg: Edema present.     Comments: +4 pitting edema of bilateral lower extremities  Skin:    General: Skin is warm and dry.  Neurological:     General: No focal deficit present.     Mental Status: She is alert and oriented to person, place, and time.      Labs on Admission: I have personally reviewed following labs and imaging studies  CBC: Recent Labs  Lab 03/13/22 1713  WBC 8.1  NEUTROABS 6.5  HGB 8.5*  HCT 26.4*  MCV 88.3  PLT 440   Basic Metabolic Panel: Recent Labs  Lab 03/13/22 2219 03/14/22 0111  NA 141  --   K 2.7*  --   CL 112*  --   CO2 18*  --   GLUCOSE 221*  --   BUN 37*  --   CREATININE 3.59*  --   CALCIUM 7.6*  --   MG  --  1.7   GFR: Estimated Creatinine Clearance: 16.5 mL/min (A) (by C-G formula based on SCr of 3.59 mg/dL (H)). Liver Function Tests: Recent Labs  Lab 03/13/22 2219  AST 14*  ALT 15  ALKPHOS 142*  BILITOT 0.3  PROT 5.5*  ALBUMIN 2.1*   No results for input(s): "LIPASE", "AMYLASE" in the last 168 hours. No results for input(s): "AMMONIA" in the last 168 hours. Coagulation Profile: No results for input(s): "INR", "PROTIME" in the last 168 hours. Cardiac Enzymes: No results for input(s): "CKTOTAL", "CKMB", "CKMBINDEX", "TROPONINI" in the last 168 hours. BNP (last 3 results) No results for input(s): "PROBNP" in the last 8760 hours. HbA1C: No results for input(s): "HGBA1C" in the last 72 hours. CBG: No results for input(s): "GLUCAP" in the last 168 hours. Lipid Profile: No results for input(s): "CHOL", "HDL", "LDLCALC", "TRIG", "CHOLHDL", "LDLDIRECT" in the last 72 hours. Thyroid Function Tests: No results for input(s): "TSH", "T4TOTAL", "FREET4", "T3FREE", "THYROIDAB" in the last  72 hours. Anemia Panel: No results for input(s):  "VITAMINB12", "FOLATE", "FERRITIN", "TIBC", "IRON", "RETICCTPCT" in the last 72 hours. Urine analysis:    Component Value Date/Time   COLORURINE YELLOW 01/30/2022 1520   APPEARANCEUR CLOUDY (A) 01/30/2022 1520   LABSPEC 1.017 01/30/2022 1520   PHURINE 5.0 01/30/2022 1520   GLUCOSEU >=500 (A) 01/30/2022 1520   HGBUR SMALL (A) 01/30/2022 1520   BILIRUBINUR NEGATIVE 01/30/2022 1520   KETONESUR NEGATIVE 01/30/2022 1520   PROTEINUR >=300 (A) 01/30/2022 1520   UROBILINOGEN 0.2 09/29/2011 2007   NITRITE NEGATIVE 01/30/2022 1520   LEUKOCYTESUR NEGATIVE 01/30/2022 1520    Radiological Exams on Admission: I have personally reviewed images DG Chest 2 View  Result Date: 03/13/2022 CLINICAL DATA:  Shortness of breath EXAM: CHEST - 2 VIEW COMPARISON:  12/18/2021 FINDINGS: Cardiomegaly. Small bilateral pleural effusions. The visualized skeletal structures are unremarkable. IMPRESSION: Cardiomegaly and small bilateral pleural effusions. Electronically Signed   By: Delanna Ahmadi M.D.   On: 03/13/2022 17:08    EKG: Independently reviewed.  Sinus rhythm, artifact, QTc 528.  Assessment and Plan  Volume overload/anasarca in the setting of AKI on CKD stage IIIb and CHF BUN 37, creatinine currently 3.5, GFR 14.  Baseline creatinine appears to be around 1.7.  PCP recently referred her to nephrology due to nephrotic range proteinuria and volume overload.  She is taking furosemide 100 mg daily.  Presenting with worsening volume overload/anasarca.  Chest x-ray showing cardiomegaly and small bilateral pleural effusions.  BNP 438.  EF previously 25% in October 2022, improved to 55-60% on recent echo done in April 2023. -IV Lasix 40 mg x 1 given in the ED -Repeat labs to check renal function -Avoid any other nephrotoxic agents/contrast -Monitor intake and output, daily weights -Fluid restriction -Check urine sodium, creatinine -UA -Renal ultrasound -Please consult nephrology in the morning.  Acute hypoxic  respiratory failure Likely due to pleural effusions.  Currently requiring 2 L supplemental oxygen. -Lasix given for diuresis -Continue supplemental oxygen, wean as tolerated  Chest pain Appears atypical, nonexertional and ongoing for several months.  Patient appears comfortable.  Troponin minimally elevated and stable, not consistent with ACS.  PE less likely given no tachycardia. -Cardiac monitoring  Hypertensive urgency Due to volume overload.  Patient reports compliance with her home antihypertensives.  Systolic as high as 962I in the ED, currently in the 190s after nitroglycerin ointment. -IV hydralazine prn SBP >160 -Resume home meds after pharmacy med rec is done.  Hypokalemia QT prolongation -Oral potassium given in the ED, repeat labs -Magnesium borderline low and replace -Avoid QT prolonging drugs -Repeat EKG in a.m.  Metabolic acidosis Likely secondary to CKD, mild and stable. -Continue to monitor and replace bicarb if not improving  Poorly controlled insulin-dependent type 2 diabetes A1c 9.7 on 01/28/2022. -Sensitive sliding scale insulin -Continue home basal insulin after pharmacy med rec is done.  History of CVA -Continue aspirin and statin after pharmacy med rec is done.  DVT prophylaxis: SQ Heparin Code Status: Full Code (discussed with the patient) Family Communication: No family available at this time. Level of care: Telemetry bed Admission status: It is my clinical opinion that referral for OBSERVATION is reasonable and necessary in this patient based on the above information provided. The aforementioned taken together are felt to place the patient at high risk for further clinical deterioration. However, it is anticipated that the patient may be medically stable for discharge from the hospital within 24 to 48 hours.   Shela Leff MD  [facility]  If 7PM-7AM, please contact night-coverage [email or web address]  03/14/2022, 4:43 AM

## 2022-03-14 NOTE — ED Notes (Signed)
Lab called to add on CMP

## 2022-03-14 NOTE — ED Provider Notes (Signed)
[facility] DEPT Provider Note   CSN: [id number] Arrival date & time: 03/13/22  1617     History  Chief Complaint  Patient presents with   Shortness of Breath   Leg Swelling    Amber Cross is a 61 y.o. female.  The history is provided by the patient.  Shortness of Breath Severity:  Severe Onset quality:  Gradual Timing:  Constant Progression:  Unchanged Chronicity:  Recurrent Relieved by:  Nothing Worsened by:  Nothing Ineffective treatments:  Diuretics Associated symptoms: no cough, no diaphoresis, no syncope, no vomiting and no wheezing   Risk factors: no recent alcohol use   Patient with CHF and renal insufficiency sent in by renal for admission due to anasarca and SOB     Home Medications Prior to Admission medications   Medication Sig Start Date End Date Taking? Authorizing Provider  acetaminophen (TYLENOL) 500 MG tablet Take 1,000 mg by mouth every 6 (six) hours as needed for mild pain.    [provider]  albuterol (VENTOLIN HFA) 108 (90 Base) MCG/ACT inhaler Inhale 2 puffs into the lungs every 6 (six) hours as needed for wheezing or shortness of breath. 02/28/22   Farrel Gordon, DO  amLODipine (NORVASC) 10 MG tablet Take 1 tablet (10 mg total) by mouth daily. 02/04/22   Lacinda Axon, MD  aspirin EC 81 MG tablet Take 1 tablet (81 mg total) by mouth daily. Swallow whole. 02/28/22   Farrel Gordon, DO  Bayer Microlet Lancets lancets Check blood sugar up to 3 times a day as instructed 04/06/19   Katherine Roan, MD  carvedilol (COREG) 25 MG tablet Take 2 tablets (50 mg total) by mouth 2 (two) times daily with a meal. 02/28/22   Farrel Gordon, DO  Continuous Blood Gluc Receiver ([place]) [place] 1 each by Does not apply route daily at 8 pm. 02/07/22   [name], DO  Continuous Blood Gluc Sensor (DEXCOM G7 SENSOR) MISC Apply 1 each topically every 14 (fourteen) days. 02/07/22   [name], DO  DULoxetine (CYMBALTA) 60 MG  capsule Take 1 capsule (60 mg total) by mouth daily. 11/24/21   Rosezetta Schlatter, MD  furosemide (LASIX) 80 MG tablet Take 1 tablet (80 mg total) by mouth 2 (two) times daily for 14 days. 02/20/22 03/06/22  [name], DO  gabapentin (NEURONTIN) 300 MG capsule Take 1 capsule (300 mg total) by mouth daily. Patient taking differently: Take 300 mg by mouth at bedtime. 11/23/21 01/28/22  Rosezetta Schlatter, MD  glucose blood test strip Check blood sugar up to 3 times a day as instructed 12/11/21   Iona Beard, MD  hydrALAZINE (APRESOLINE) 50 MG tablet Take 1 tablet (50 mg total) by mouth every 8 (eight) hours. 02/03/22   Lacinda Axon, MD  hydrOXYzine (ATARAX) 25 MG tablet Take 1 tablet (25 mg total) by mouth at bedtime. 02/03/22   Lacinda Axon, MD  insulin isophane & regular human KwikPen (NOVOLIN 70/30 KWIKPEN) (70-30) 100 UNIT/ML KwikPen Inject 15 Units into the skin in the morning AND 10 Units every evening. 02/28/22   Farrel Gordon, DO  Insulin Pen Needle (UNIFINE PENTIPS) 32G X 4 MM MISC Use in the morning and at bedtime 12/11/21 12/06/22  Iona Beard, MD  Multiple Vitamin (MULTI-VITAMIN) tablet Take 1 tablet by mouth daily.    [provider]  nitroGLYCERIN (NITROSTAT) 0.4 MG SL tablet Place 0.4 mg under the tongue every 5 (five) minutes as needed for chest pain.  10/01/21   [provider]  pantoprazole (PROTONIX) 40 MG tablet Take 1 tablet (40 mg total) by mouth daily. 12/11/21 01/28/22  Iona Beard, MD  rosuvastatin (CRESTOR) 40 MG tablet Take 1 tablet (40 mg total) by mouth daily. 11/24/21   Rosezetta Schlatter, MD  sucralfate (CARAFATE) 1 g tablet Take 1 tablet (1 g total) by mouth 4 (four) times daily -  with meals and at bedtime. Patient taking differently: Take 1 g by mouth 2 (two) times daily. 11/23/21   Rosezetta Schlatter, MD  Vitamin D, Ergocalciferol, (DRISDOL) 1.25 MG (50000 UNIT) CAPS capsule Take 1 capsule (50,000 Units total) by mouth every 7 (seven) days. 02/07/22   Lacinda Axon, MD      Allergies    Penicillins, Strawberry (diagnostic), and Tomato    Review of Systems   Review of Systems  Constitutional:  Negative for diaphoresis.  HENT:  Negative for facial swelling.   Eyes:  Negative for redness.  Respiratory:  Positive for shortness of breath. Negative for cough and wheezing.   Cardiovascular:  Positive for leg swelling. Negative for syncope.  Gastrointestinal:  Negative for vomiting.  All other systems reviewed and are negative.   Physical Exam Updated Vital Signs BP (!) 184/83   Pulse 79   Temp 97.6 F (36.4 C) (Oral)   Resp (!) 23   Ht 5' 7.5" (1.715 m)   Wt 64 kg   LMP 09/29/2011   SpO2 93%   BMI 21.76 kg/m  Physical Exam Vitals and nursing note reviewed.  Constitutional:      General: She is not in acute distress.    Appearance: Normal appearance. She is well-developed.  HENT:     Head: Normocephalic and atraumatic.     Nose: Nose normal.  Eyes:     Pupils: Pupils are equal, round, and reactive to light.  Cardiovascular:     Rate and Rhythm: Normal rate and regular rhythm.     Pulses: Normal pulses.     Heart sounds: Normal heart sounds.  Pulmonary:     Effort: No respiratory distress.     Breath sounds: No wheezing.  Abdominal:     General: Bowel sounds are normal. There is no distension.     Palpations: Abdomen is soft.     Tenderness: There is no abdominal tenderness. There is no guarding or rebound.  Genitourinary:    Vagina: No vaginal discharge.  Musculoskeletal:        General: Normal range of motion.     Cervical back: Neck supple.     Right lower leg: Edema present.     Left lower leg: Edema present.  Skin:    General: Skin is warm and dry.     Capillary Refill: Capillary refill takes less than 2 seconds.     Findings: No erythema or rash.  Neurological:     General: No focal deficit present.     Mental Status: She is alert.     Deep Tendon Reflexes: Reflexes normal.  Psychiatric:        Mood and  Affect: Affect is angry.     ED Results / Procedures / Treatments   Labs (all labs ordered are listed, but only abnormal results are displayed) Results for orders placed or performed during the hospital encounter of 03/13/22  Brain natriuretic peptide  Result Value Ref Range   B Natriuretic Peptide 438.8 (H) 0.0 - 100.0 pg/mL  CBC with Differential  Result Value Ref Range  WBC 8.1 4.0 - 10.5 K/uL   RBC 2.99 (L) 3.87 - 5.11 MIL/uL   Hemoglobin 8.5 (L) 12.0 - 15.0 g/dL   HCT 26.4 (L) 36.0 - 46.0 %   MCV 88.3 80.0 - 100.0 fL   MCH 28.4 26.0 - 34.0 pg   MCHC 32.2 30.0 - 36.0 g/dL   RDW 15.9 (H) 11.5 - 15.5 %   Platelets 342 150 - 400 K/uL   nRBC 0.0 0.0 - 0.2 %   Neutrophils Relative % 80 %   Neutro Abs 6.5 1.7 - 7.7 K/uL   Lymphocytes Relative 14 %   Lymphs Abs 1.2 0.7 - 4.0 K/uL   Monocytes Relative 5 %   Monocytes Absolute 0.4 0.1 - 1.0 K/uL   Eosinophils Relative 1 %   Eosinophils Absolute 0.0 0.0 - 0.5 K/uL   Basophils Relative 0 %   Basophils Absolute 0.0 0.0 - 0.1 K/uL   Immature Granulocytes 0 %   Abs Immature Granulocytes 0.02 0.00 - 0.07 K/uL  Comprehensive metabolic panel  Result Value Ref Range   Sodium 141 135 - 145 mmol/L   Potassium 2.7 (LL) 3.5 - 5.1 mmol/L   Chloride 112 (H) 98 - 111 mmol/L   CO2 18 (L) 22 - 32 mmol/L   Glucose, Bld 221 (H) 70 - 99 mg/dL   BUN 37 (H) 6 - 20 mg/dL   Creatinine, Ser 3.59 (H) 0.44 - 1.00 mg/dL   Calcium 7.6 (L) 8.9 - 10.3 mg/dL   Total Protein 5.5 (L) 6.5 - 8.1 g/dL   Albumin 2.1 (L) 3.5 - 5.0 g/dL   AST 14 (L) 15 - 41 U/L   ALT 15 0 - 44 U/L   Alkaline Phosphatase 142 (H) 38 - 126 U/L   Total Bilirubin 0.3 0.3 - 1.2 mg/dL   GFR, Estimated 14 (L) >60 mL/min   Anion gap 11 5 - 15  Magnesium  Result Value Ref Range   Magnesium 1.7 1.7 - 2.4 mg/dL  Troponin I (High Sensitivity)  Result Value Ref Range   Troponin I (High Sensitivity) 19 (H) <18 ng/L  Troponin I (High Sensitivity)  Result Value Ref Range   Troponin I  (High Sensitivity) 20 (H) <18 ng/L   DG Chest 2 View  Result Date: 03/13/2022 CLINICAL DATA:  Shortness of breath EXAM: CHEST - 2 VIEW COMPARISON:  12/18/2021 FINDINGS: Cardiomegaly. Small bilateral pleural effusions. The visualized skeletal structures are unremarkable. IMPRESSION: Cardiomegaly and small bilateral pleural effusions. Electronically Signed   By: Delanna Ahmadi M.D.   On: 03/13/2022 17:08     EKG EKG Interpretation  Date/Time:  Wednesday March 13 2022 17:17:48 EDT Ventricular Rate:  81 PR Interval:    QRS Duration: 231 QT Interval:  454 QTC Calculation: 528 R Axis:   14 Text Interpretation: Normal sinus rhythm Anterior infarct, recent Prolonged QT interval Confirmed by Dory Horn) on 03/14/2022 12:40:56 AM  Radiology DG Chest 2 View  Result Date: 03/13/2022 CLINICAL DATA:  Shortness of breath EXAM: CHEST - 2 VIEW COMPARISON:  12/18/2021 FINDINGS: Cardiomegaly. Small bilateral pleural effusions. The visualized skeletal structures are unremarkable. IMPRESSION: Cardiomegaly and small bilateral pleural effusions. Electronically Signed   By: Delanna Ahmadi M.D.   On: 03/13/2022 17:08    Procedures Procedures    Medications Ordered in ED Medications  acetaminophen (TYLENOL) tablet 1,000 mg (1,000 mg Oral Patient Refused/Not Given 03/14/22 0106)  ipratropium-albuterol (DUONEB) 0.5-2.5 (3) MG/3ML nebulizer solution 3 mL (3 mLs Nebulization Given 03/14/22 0120)  nitroGLYCERIN (NITROGLYN) 2 % ointment 1 inch (1 inch Topical Given 03/14/22 0105)  furosemide (LASIX) injection 40 mg (40 mg Intravenous Given 03/14/22 0108)  potassium chloride SA (KLOR-CON M) CR tablet 20 mEq (20 mEq Oral Given 03/14/22 0137)    ED Course/ Medical Decision Making/ A&P                           Medical Decision Making Amount and/or Complexity of Data Reviewed External Data Reviewed: notes.    Details: previous notes reviewed Labs: ordered.    Details: all labs reviewed:  elevated BNP  428.8, elevated troponin 20.  normal white count and platelet count low hemoglobin 8.5.  Normal sdoium low potassium 2.7 elevated BUN 37, and creatinine 3.59  elevated troponin 20 Radiology: ordered and independent interpretation performed.    Details: No PNA by me ECG/medicine tests: ordered and independent interpretation performed. Decision-making details documented in ED Course.  Risk OTC drugs. Prescription drug management. Decision regarding hospitalization.    Final Clinical Impression(s) / ED Diagnoses Final diagnoses:  Peripheral edema  Hypertensive urgency  AKI (acute kidney injury) ([place])  Elevated troponin I level   The patient appears reasonably stabilized for admission considering the current resources, flow, and capabilities available in the ED at this time, and I doubt any other [facility] requiring further screening and/or treatment in the ED prior to admission.  Rx / DC Orders ED Discharge Orders     None         Wylie Russon, MD 03/14/22 [phone]

## 2022-03-14 NOTE — Progress Notes (Signed)
Came to pt's room to do nursing admission history. Staff reports she is at a test in radiology Shelly, RN-BC Throughput Nurse 03/14/2022 4:47 PM

## 2022-03-14 NOTE — ED Notes (Signed)
Pt currently denies any needs or complaints at this time.

## 2022-03-14 NOTE — Progress Notes (Signed)
PROGRESS NOTE    Azalynn Maxim  FOY:[id number] DOB: 1960-11-20 DOA: 03/13/2022 PCP: [name], DO   Chief Complaint  Patient presents with   Shortness of Breath   Leg Swelling    Brief Narrative:    This is a no charge note as patient was seen and admitted earlier today by Dr. Wandra Feinstein, chart, imaging and labs were reviewed, patient was seen and examined.   Katheren Jimmerson is a 61 y.o. 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  Admitted a month ago for CPPD arthritis of left knee and AKI.  Had a follow-up visit with PCP on 8/3 and referred to nephrology due to concern for nephrotic range proteinuria and volume overload.   Patient presents to the ED with complaints of shortness of breath, chest pain, cough, and bilateral lower extremity edema.  In the ED, hypertensive with systolic above 767.  SPO2 86-88% on room air and placed on 2 L supplemental oxygen.  Labs showing no leukocytosis.  Hemoglobin 8.5, stable.  Potassium 2.7, magnesium 1.7, bicarb 18, BUN 37, creatinine 3.5, glucose 221, calcium 7.6, total protein 5.5, albumin 2.1.  Alkaline phosphatase 142, no elevation of remainder of LFTs.  BNP 438.  High-sensitivity troponin 19 > 20.  SARS-CoV-2 PCR negative.  Chest x-ray showing cardiomegaly and small bilateral pleural effusions. Medications administered include IV Lasix 40 mg, DuoNeb treatment, nitroglycerin ointment, and oral potassium. -She was admitted for further management. Assessment & Plan:   Principal Problem:   Volume overload Active Problems:   Type 2 diabetes mellitus with chronic kidney disease, with long-term current use of insulin (HCC)   Chest pain   Acute heart failure (HCC)   Chronic systolic CHF (congestive heart failure) (HCC)   Acute renal failure superimposed on stage 3b chronic kidney disease (HCC)   Acute respiratory failure with  hypoxia (HCC)   Hypertensive urgency   Hypokalemia   QT prolongation   Metabolic acidosis  Anasarca AKI on CKD stage IIIb Acute on chronic combined CHF -Discussed in details with cardiology and renal service. -Patient with proteinuria with anasarca, still on IV Lasix 80 mg IV twice daily, discussed with renal, likely will need renal biopsy this admission when more stable -Her 2D echo and drop in her EF 40 to 45%, with questionable thrombus in pulmonary artery, for which I will obtain VQ scan to rule out PE, and given she is having an even upper extremity edema will obtain venous Dopplers to rule out DVT as well. -Avoid any other nephrotoxic agents/contrast -Monitor intake and output, daily weights -Fluid restriction -Check urine sodium, creatinine -UA -Renal ultrasound -Please consult nephrology in the morning.   Acute hypoxic respiratory failure Likely due to pleural effusions.  Currently requiring 2 L supplemental oxygen. -Lasix given for diuresis -Continue supplemental oxygen, wean as tolerated   Chest pain Appears atypical, nonexertional and ongoing for several months.  Patient appears comfortable.  Troponin minimally elevated and stable, not consistent with ACS.  . -Cardiac monitoring   Hypertensive urgency Due to volume overload.  Patient reports compliance with her home antihypertensives.  Systolic as high as 209O in the ED, currently in the 190s after nitroglycerin ointment. -IV hydralazine prn SBP >160 -Resume home meds after pharmacy med rec is done.   Hypokalemia QT prolongation -Oral potassium given in the ED, repeat labs -Magnesium borderline low and replace -Avoid QT prolonging drugs -Repeat EKG in a.m.  Metabolic acidosis Likely secondary to CKD, mild and stable. -Continue to monitor and replace bicarb if not improving   Poorly controlled insulin-dependent type 2 diabetes A1c 9.7 on 01/28/2022. -Sensitive sliding scale insulin -Continue home basal insulin  after pharmacy med rec is done.   History of CVA -Continue aspirin and statin after pharmacy med rec is done.     DVT prophylaxis: Heparin Code Status: Full Family Communication: none at bedside Disposition:   Status is: Inpatient  Consultants:  Cardiology renal   Subjective:  Report generalized weakness, fatigue, dyspnea, she denies any chest pain currently  Objective: Vitals:   03/14/22 0930 03/14/22 1230 03/14/22 1330 03/14/22 1543  BP: (!) 166/78 (!) 191/93 (!) 177/77 (!) 193/85  Pulse: 80 80 84 86  Resp: 20 18 (!) 21 (!) 24  Temp: 98.1 F (36.7 C)  98.7 F (37.1 C) 97.7 F (36.5 C)  TempSrc: Oral   Oral  SpO2: 94% 93% (!) 88% 93%  Weight:      Height:        Intake/Output Summary (Last 24 hours) at 03/14/2022 1607 Last data filed at 03/14/2022 0758 Gross per 24 hour  Intake 89.45 ml  Output --  Net 89.45 ml   Filed Weights   03/13/22 1622  Weight: 64 kg    Examination:  Awake Alert, Oriented X 3, chronically ill-appearing Symmetrical Chest wall movement, Rales at the bases, scattered wheezing RRR,No Gallops,Rubs or new Murmurs, No Parasternal Heave, JVD present +ve B.Sounds, Abd Soft, No tenderness, No rebound - guarding or rigidity. No Cyanosis, Clubbing 2 edema bilaterally, +1 left upper extremity edema    Data Reviewed: I have personally reviewed following labs and imaging studies  CBC: Recent Labs  Lab 03/13/22 1713  WBC 8.1  NEUTROABS 6.5  HGB 8.5*  HCT 26.4*  MCV 88.3  PLT 335    Basic Metabolic Panel: Recent Labs  Lab 03/13/22 2219 03/14/22 0111 03/14/22 0532  NA 141  --  142  K 2.7*  --  2.9*  CL 112*  --  112*  CO2 18*  --  22  GLUCOSE 221*  --  153*  BUN 37*  --  37*  CREATININE 3.59*  --  3.64*  CALCIUM 7.6*  --  8.2*  MG  --  1.7  --     GFR: Estimated Creatinine Clearance: 16.3 mL/min (A) (by C-G formula based on SCr of 3.64 mg/dL (H)).  Liver Function Tests: Recent Labs  Lab 03/13/22 2219  AST 14*   ALT 15  ALKPHOS 142*  BILITOT 0.3  PROT 5.5*  ALBUMIN 2.1*    CBG: Recent Labs  Lab 03/14/22 0826 03/14/22 1101 03/14/22 1143  GLUCAP 178* 66* 141*     Recent Results (from the past 240 hour(s))  SARS Coronavirus 2 by RT PCR (hospital order, performed in [facility] hospital lab) *cepheid single result test* Anterior Nasal Swab     Status: None   Collection Time: 03/14/22 12:51 AM   Specimen: Anterior Nasal Swab  Result Value Ref Range Status   SARS Coronavirus 2 by RT PCR NEGATIVE NEGATIVE Final    Comment: (NOTE) SARS-CoV-2 target nucleic acids are NOT DETECTED.  The SARS-CoV-2 RNA is generally detectable in upper and lower respiratory specimens during the acute phase of infection. The lowest concentration of SARS-CoV-2 viral copies this assay can detect is 250 copies / mL. A negative result does not preclude SARS-CoV-2 infection and should not be used as the sole basis for  treatment or other patient management decisions.  A negative result may occur with improper specimen collection / handling, submission of specimen other than nasopharyngeal swab, presence of viral mutation(s) within the areas targeted by this assay, and inadequate number of viral copies (<250 copies / mL). A negative result must be combined with clinical observations, patient history, and epidemiological information.  Fact Sheet for Patients:   https://www.patel.info/  Fact Sheet for Healthcare Providers: https://hall.com/  This test is not yet approved or  cleared by the [place] FDA and has been authorized for detection and/or diagnosis of SARS-CoV-2 by FDA under an Emergency Use Authorization (EUA).  This EUA will remain in effect (meaning this test can be used) for the duration of the COVID-19 declaration under Section 564(b)(1) of the Act, 21 U.S.C. section 360bbb-3(b)(1), unless the authorization is terminated or revoked sooner.  Performed  at [facility], [place] [address], [place], [place] [address]          Radiology Studies: ECHOCARDIOGRAM LIMITED  Result Date: 03/14/2022    ECHOCARDIOGRAM LIMITED REPORT   Patient Name:   [facility] Date of Exam: 03/14/2022 Medical Rec #:  [id number]       Height:       67.5 in Accession #:    [id number]      Weight:       141.0 lb Date of Birth:  06-20-1961       BSA:          1.753 m Patient Age:    62 years        BP:           186/82 mmHg Patient Gender: F               HR:           84 bpm. Exam Location:  Inpatient Procedure: Limited Echo Indications:    X32.35 Acute systolic (congestive) heart failure  History:        Patient has prior history of Echocardiogram examinations, most                 recent 11/08/2021. CHF; Risk Factors:Hypertension, Diabetes and                 Dyslipidemia.  Sonographer:    Raquel Sarna Senior RDCS Referring Phys: [id number] Darreld Mclean  Sonographer Comments: Exam performed sitting upright for comfort. IMPRESSIONS  1. LV walls have a speckled appearance. Left ventricular ejection fraction, by estimation, is 40 to 45%. Left ventricular ejection fraction by 2D MOD biplane is 44.5 %. The left ventricle has mildly decreased function. The left ventricle demonstrates global hypokinesis. There is severe left ventricular hypertrophy.  2. No definitive tamponade physiology, although the RV appears underfilled. a small pericardial effusion is present. The pericardial effusion is circumferential.  3. The aortic valve is tricuspid. Aortic valve regurgitation is not visualized.  4. Suggestion of possible thrombus in the main pulmonary artery at the PA bifurcation, which appears dilated (image 4). mildly dilated pulmonary artery.  5. The inferior vena cava is normal in size with <50% respiratory variability, suggesting right atrial pressure of 8 mmHg. Comparison(s): Changes from prior study are noted. 11/08/2021: LVEF 55-60%, severe LVH, speckled myocardium.  Conclusion(s)/Recommendation(s): Cannot exclude possible thrombus at the PA bifurcation - consider further imaging with V/Q scan. Critical findings reported to Dr. Gardiner Rhyme and acknowledged at 2:43 pm. FINDINGS  Left Ventricle: LV walls have a speckled appearance. Left ventricular ejection fraction, by estimation,  is 40 to 45%. Left ventricular ejection fraction by 2D MOD biplane is 44.5 %. The left ventricle has mildly decreased function. The left ventricle demonstrates global hypokinesis. There is severe left ventricular hypertrophy. Pericardium: No definitive tamponade physiology, although the RV appears underfilled. A small pericardial effusion is present. The pericardial effusion is circumferential. There is diastolic collapse of the right ventricular free wall and diastolic collapse of the right atrial wall. Aortic Valve: The aortic valve is tricuspid. Aortic valve regurgitation is not visualized. Pulmonary Artery: Suggestion of possible thrombus in the main pulmonary artery at the PA bifurcation, which appears dilated (image 4). The pulmonary artery is mildly dilated. Venous: The inferior vena cava is normal in size with less than 50% respiratory variability, suggesting right atrial pressure of 8 mmHg.  LV Volumes (MOD)               Biplane EF (MOD) LV vol d, MOD    90.1 ml       LV Biplane EF:   Left A2C:                                            ventricular LV vol d, MOD    103.0 ml                       ejection A4C:                                            fraction by LV vol s, MOD    48.4 ml                        2D MOD A2C:                                            biplane is LV vol s, MOD    56.2 ml                        44.5 %. A4C: LV SV MOD A2C:   41.7 ml LV SV MOD A4C:   103.0 ml LV SV MOD BP:    43.0 ml Lyman Bishop MD Electronically signed by Lyman Bishop MD Signature Date/Time: 03/14/2022/2:45:58 PM    Final    US RENAL  Result Date: 03/14/2022 CLINICAL DATA:  61 year old female with  acute renal insufficiency. EXAM: RENAL / URINARY TRACT ULTRASOUND COMPLETE COMPARISON:  Renal ultrasound 01/30/2022 and CT Chest, Abdomen, and Pelvis 11/06/2021. FINDINGS: Right Kidney: Renal measurements: 13.4 x 6.2 x 6.6 cm = volume: 289 mL. Echogenic right kidney (image 2), similar to last month. No solid right renal mass or hydronephrosis. Small benign upper pole cyst (no follow-up imaging recommended). Left Kidney: Renal measurements: 12.2 x 7.9 x 6.5 cm = volume: 328 mL. Echogenic left kidney similar to the last month. No solid left renal mass or hydronephrosis. Bladder: Appears normal for degree of bladder distention. Other: None. IMPRESSION: Chronic medical renal disease. No acute renal finding. Electronically Signed   By: Genevie Ann M.D.   On: 03/14/2022 07:17   DG Chest  2 View  Result Date: 03/13/2022 CLINICAL DATA:  Shortness of breath EXAM: CHEST - 2 VIEW COMPARISON:  12/18/2021 FINDINGS: Cardiomegaly. Small bilateral pleural effusions. The visualized skeletal structures are unremarkable. IMPRESSION: Cardiomegaly and small bilateral pleural effusions. Electronically Signed   By: Delanna Ahmadi M.D.   On: 03/13/2022 17:08        Scheduled Meds:  [START ON 03/15/2022] amLODipine  10 mg Oral Daily   furosemide  80 mg Intravenous BID   heparin  5,000 Units Subcutaneous Q8H   hydrALAZINE  50 mg Oral Q8H   insulin aspart  0-5 Units Subcutaneous QHS   insulin aspart  0-9 Units Subcutaneous TID WC   Continuous Infusions:   LOS: 0 days      Phillips Climes, MD [facility]   To contact the attending provider between 7A-7P or the covering provider during after hours 7P-7A, please log into the web site [email or web address] and access using universal [place] password for that web site. If you do not have the password, please call the hospital operator.  03/14/2022, 4:07 PM

## 2022-03-15 ENCOUNTER — Inpatient Hospital Stay (HOSPITAL_COMMUNITY): Payer: Commercial Managed Care - HMO

## 2022-03-15 DIAGNOSIS — R079 Chest pain, unspecified: Secondary | ICD-10-CM

## 2022-03-15 DIAGNOSIS — R609 Edema, unspecified: Secondary | ICD-10-CM | POA: Diagnosis not present

## 2022-03-15 DIAGNOSIS — E8770 Fluid overload, unspecified: Secondary | ICD-10-CM | POA: Diagnosis not present

## 2022-03-15 DIAGNOSIS — I5043 Acute on chronic combined systolic (congestive) and diastolic (congestive) heart failure: Secondary | ICD-10-CM

## 2022-03-15 DIAGNOSIS — R9431 Abnormal electrocardiogram [ECG] [EKG]: Secondary | ICD-10-CM

## 2022-03-15 DIAGNOSIS — I160 Hypertensive urgency: Secondary | ICD-10-CM | POA: Diagnosis not present

## 2022-03-15 DIAGNOSIS — N179 Acute kidney failure, unspecified: Secondary | ICD-10-CM | POA: Diagnosis not present

## 2022-03-15 LAB — CBC
HCT: 26.2 % — ABNORMAL LOW (ref 36.0–46.0)
Hemoglobin: 8.4 g/dL — ABNORMAL LOW (ref 12.0–15.0)
MCH: 28.1 pg (ref 26.0–34.0)
MCHC: 32.1 g/dL (ref 30.0–36.0)
MCV: 87.6 fL (ref 80.0–100.0)
Platelets: 340 10*3/uL (ref 150–400)
RBC: 2.99 MIL/uL — ABNORMAL LOW (ref 3.87–5.11)
RDW: 15.6 % — ABNORMAL HIGH (ref 11.5–15.5)
WBC: 7.4 10*3/uL (ref 4.0–10.5)
nRBC: 0 % (ref 0.0–0.2)

## 2022-03-15 LAB — RENAL FUNCTION PANEL
Albumin: 2.2 g/dL — ABNORMAL LOW (ref 3.5–5.0)
Anion gap: 8 (ref 5–15)
BUN: 34 mg/dL — ABNORMAL HIGH (ref 6–20)
CO2: 21 mmol/L — ABNORMAL LOW (ref 22–32)
Calcium: 8 mg/dL — ABNORMAL LOW (ref 8.9–10.3)
Chloride: 113 mmol/L — ABNORMAL HIGH (ref 98–111)
Creatinine, Ser: 3.39 mg/dL — ABNORMAL HIGH (ref 0.44–1.00)
GFR, Estimated: 15 mL/min — ABNORMAL LOW (ref >60)
Glucose, Bld: 139 mg/dL — ABNORMAL HIGH (ref 70–99)
Phosphorus: 4.7 mg/dL — ABNORMAL HIGH (ref 2.5–4.6)
Potassium: 3.2 mmol/L — ABNORMAL LOW (ref 3.5–5.1)
Sodium: 142 mmol/L (ref 135–145)

## 2022-03-15 LAB — SODIUM, URINE, RANDOM: Sodium, Ur: 85 mmol/L

## 2022-03-15 LAB — CREATININE, URINE, RANDOM: Creatinine, Urine: 66 mg/dL

## 2022-03-15 LAB — URINALYSIS, ROUTINE W REFLEX MICROSCOPIC
Bilirubin Urine: NEGATIVE
Glucose, UA: 150 mg/dL — AB
Ketones, ur: NEGATIVE mg/dL
Leukocytes,Ua: NEGATIVE
Nitrite: NEGATIVE
Protein, ur: >300 mg/dL — AB
Specific Gravity, Urine: 1.014 (ref 1.005–1.030)
pH: 5 (ref 5.0–8.0)

## 2022-03-15 LAB — URINALYSIS, MICROSCOPIC (REFLEX): Squamous Epithelial / HPF: NONE SEEN (ref 0–5)

## 2022-03-15 LAB — GLUCOSE, CAPILLARY
Glucose-Capillary: 137 mg/dL — ABNORMAL HIGH (ref 70–99)
Glucose-Capillary: 128 mg/dL — ABNORMAL HIGH (ref 70–99)
Glucose-Capillary: 103 mg/dL — ABNORMAL HIGH (ref 70–99)
Glucose-Capillary: 183 mg/dL — ABNORMAL HIGH (ref 70–99)

## 2022-03-15 MED ORDER — ISOSORBIDE MONONITRATE ER 30 MG PO TB24
30.0000 mg | ORAL_TABLET | Freq: Every day | ORAL | Status: DC
  Administered 2022-03-15 – 2022-03-18 (×4): 30 mg via ORAL
  Filled 2022-03-15 (×4): qty 1

## 2022-03-15 MED ORDER — ASPIRIN 81 MG PO TBEC
81.0000 mg | DELAYED_RELEASE_TABLET | Freq: Every day | ORAL | Status: DC
  Administered 2022-03-15 – 2022-03-17 (×3): 81 mg via ORAL
  Filled 2022-03-15 (×3): qty 1

## 2022-03-15 MED ORDER — ROSUVASTATIN CALCIUM 20 MG PO TABS
40.0000 mg | ORAL_TABLET | Freq: Every day | ORAL | Status: DC
  Administered 2022-03-15 – 2022-03-18 (×4): 40 mg via ORAL
  Filled 2022-03-15 (×4): qty 2

## 2022-03-15 MED ORDER — POTASSIUM CHLORIDE CRYS ER 20 MEQ PO TBCR
40.0000 meq | EXTENDED_RELEASE_TABLET | Freq: Once | ORAL | Status: AC
  Administered 2022-03-15: 40 meq via ORAL
  Filled 2022-03-15: qty 2

## 2022-03-15 MED ORDER — ACETAMINOPHEN 325 MG PO TABS
650.0000 mg | ORAL_TABLET | Freq: Four times a day (QID) | ORAL | Status: DC | PRN
Start: 2022-03-15 — End: 2022-03-18
  Administered 2022-03-16 – 2022-03-18 (×2): 650 mg via ORAL
  Filled 2022-03-15 (×4): qty 2

## 2022-03-15 MED ORDER — PROCHLORPERAZINE EDISYLATE 10 MG/2ML IJ SOLN: 10.0000 mg | Freq: Four times a day (QID) | INTRAMUSCULAR | Status: DC | PRN

## 2022-03-15 MED ORDER — OXYCODONE-ACETAMINOPHEN 5-325 MG PO TABS
1.0000 | ORAL_TABLET | Freq: Four times a day (QID) | ORAL | Status: AC | PRN
  Administered 2022-03-15 (×2): 1 via ORAL
  Filled 2022-03-15 (×2): qty 1

## 2022-03-15 MED ORDER — OXYCODONE-ACETAMINOPHEN 5-325 MG PO TABS
1.0000 | ORAL_TABLET | Freq: Four times a day (QID) | ORAL | Status: AC | PRN
  Administered 2022-03-15 – 2022-03-16 (×2): 1 via ORAL
  Filled 2022-03-15 (×2): qty 1

## 2022-03-15 NOTE — Progress Notes (Signed)
PT Cancellation Note / Screen  Patient Details Name: Amber Cross MRN: [id number] DOB: Dec 11, 1960   Cancelled Treatment:    Reason Eval/Treat Not Completed: PT screened, no needs identified, will sign off Pt ambulated with mobility specialist.  Mobility specialist reports pt moving well.  PT to sign off.   [name] 03/15/2022, 1:36 PM Jannette Spanner PT, DPT Physical Therapist [facility] Preferred contact method: Secure Chat Weekend Pager Only: [phone] Office: [phone]

## 2022-03-15 NOTE — Progress Notes (Signed)
PROGRESS NOTE    Amber Cross  IRC:[id number] DOB: 14-Jan-1961 DOA: 03/13/2022 PCP: [name], DO   Brief Narrative:     Amber Cross is a 61 y.o. 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  Admitted a month ago for CPPD arthritis of left knee and AKI.  Had a follow-up visit with PCP on 8/3 and referred to nephrology due to concern for nephrotic range proteinuria and volume overload.   Patient presents to the ED with complaints of shortness of breath, chest pain, cough, and bilateral lower extremity edema.  In the ED, hypertensive with systolic above 510.  SPO2 86-88% on room air and placed on 2 L supplemental oxygen.  Labs showing no leukocytosis.  Hemoglobin 8.5, stable.  Potassium 2.7, magnesium 1.7, bicarb 18, BUN 37, creatinine 3.5, glucose 221, calcium 7.6, total protein 5.5, albumin 2.1.  Alkaline phosphatase 142, no elevation of remainder of LFTs.  BNP 438.  High-sensitivity troponin 19 > 20.  SARS-CoV-2 PCR negative.  Chest x-ray showing cardiomegaly and small bilateral pleural effusions. Medications administered include IV Lasix 40 mg, DuoNeb treatment, nitroglycerin ointment, and oral potassium. -She was admitted for further management.  Assessment & Plan:   Principal Problem:   Volume overload Active Problems:   Type 2 diabetes mellitus with chronic kidney disease, with long-term current use of insulin (HCC)   Chest pain   Acute heart failure (HCC)   Chronic systolic CHF (congestive heart failure) (HCC)   AKI (acute kidney injury) ([place])   Acute respiratory failure with hypoxia (HCC)   Hypertensive urgency   Hypokalemia   QT prolongation   Metabolic acidosis   Acute on chronic combined systolic and diastolic CHF (congestive heart failure) (HCC)  Anasarca AKI on CKD stage IIIb with nephrotic syndrome: chronic combined CHF with normalization of  EF: Patient follows nephrology as outpatient and plan was to do renal biopsy by end of the month but now the plan is to likely do it inpatient once patient stabilizes a bit.  Nephrology and cardiology on board, cardiology deferred to nephrology for diuresis. -Her 2D echo and drop in her EF 40 to 45%, with questionable thrombus in pulmonary artery, for which VQ scan was obtained which ruled out PE.  DVT also ruled out.  Creatinine is stable.  Renal ultrasound unremarkable.   Acute hypoxic respiratory failure/pleural effusion. Likely due to pleural effusions.  Weaned down to room air now.    Chest pain: Per cardiology, patient has chronic chest pain and this is [place] schedule, no further work-up is recommended.   Hypertensive urgency: Presented with systolic as high as 258N in the ED, likely due to volume overload.  Blood pressure improving but still elevated.  Cardiology adjusting medications.  They are holding her Coreg.  Patient only on hydralazine 50 mg 3 times daily and cardiology has added Imdur 30 mg p.o. daily.  Hypokalemia QT prolongation: Low again, 3.2.  Will replace.   Metabolic acidosis Likely secondary to CKD, mild and stable.  Management deferred to nephrology.   Poorly controlled insulin-dependent type 2 diabetes A1c 9.7 on 01/28/2022.  Takes Novolin 70/30 15 units in the morning and 10 units at night.  Currently off of that and she is only on SSI and blood sugars very well controlled.   History of CVA: Resume statin and aspirin.  DVT prophylaxis: heparin injection 5,000 Units Start: 03/14/22 0600   Code Status:  Full Code  Family Communication:  None present at bedside.  Plan of care discussed with patient in length and he/she verbalized understanding and agreed with it.  Status is: Inpatient Remains inpatient appropriate because: Needs more diuresis.   Estimated body mass index is 21.5 kg/m as calculated from the following:   Height as of this encounter: 5' 7.5" (1.715  m).   Weight as of this encounter: 63.2 kg.    Nutritional Assessment: Body mass index is 21.5 kg/m.Marland Kitchen Seen by dietician.  I agree with the assessment and plan as outlined below: Nutrition Status:        . Skin Assessment: I have examined the patient's skin and I agree with the wound assessment as performed by the wound care RN as outlined below:    Consultants:  Nephrology Cardiology  Procedures:  None  Antimicrobials:  Anti-infectives (From admission, onward)    None         Subjective: Patient seen and examined.  No new complaints.  Feels better, breathing is improving.  Objective: Vitals:   03/14/22 2058 03/15/22 0216 03/15/22 0708 03/15/22 0938  BP: (!) 162/76 (!) 172/74 (!) 181/82 (!) 159/66  Pulse: 86 94 82   Resp: 18 20 20    Temp: 98.6 F (37 C) 98.4 F (36.9 C) 98.2 F (36.8 C)   TempSrc: Oral Oral Oral   SpO2: 94% 94% 96%   Weight:   63.2 kg   Height:        Intake/Output Summary (Last 24 hours) at 03/15/2022 1252 Last data filed at 03/15/2022 1000 Gross per 24 hour  Intake 240 ml  Output 0 ml  Net 240 ml   Filed Weights   03/13/22 1622 03/15/22 0708  Weight: 64 kg 63.2 kg    Examination:  General exam: Appears calm and comfortable  Respiratory system: Diminished breath sounds at the bases bilaterally, respiratory effort normal. Cardiovascular system: S1 & S2 heard, RRR. No JVD, murmurs, rubs, gallops or clicks. No pedal edema. Gastrointestinal system: Abdomen is nondistended, soft and nontender. No organomegaly or masses felt. Normal bowel sounds heard. Central nervous system: Alert and oriented. No focal neurological deficits. Extremities: Symmetric 5 x 5 power. Skin: No rashes, lesions or ulcers Psychiatry: Judgement and insight appear normal. Mood & affect appropriate.    Data Reviewed: I have personally reviewed following labs and imaging studies  CBC: Recent Labs  Lab 03/13/22 1713 03/15/22 0558  WBC 8.1 7.4  NEUTROABS  6.5  --   HGB 8.5* 8.4*  HCT 26.4* 26.2*  MCV 88.3 87.6  PLT 342 751   Basic Metabolic Panel: Recent Labs  Lab 03/13/22 2219 03/14/22 0111 03/14/22 0532 03/15/22 0558  NA 141  --  142 142  K 2.7*  --  2.9* 3.2*  CL 112*  --  112* 113*  CO2 18*  --  22 21*  GLUCOSE 221*  --  153* 139*  BUN 37*  --  37* 34*  CREATININE 3.59*  --  3.64* 3.39*  CALCIUM 7.6*  --  8.2* 8.0*  MG  --  1.7  --   --   PHOS  --   --   --  4.7*   GFR: Estimated Creatinine Clearance: 17.5 mL/min (A) (by C-G formula based on SCr of 3.39 mg/dL (H)). Liver Function Tests: Recent Labs  Lab 03/13/22 2219 03/15/22 0558  AST 14*  --   ALT 15  --   ALKPHOS 142*  --   BILITOT 0.3  --  PROT 5.5*  --   ALBUMIN 2.1* 2.2*   No results for input(s): "LIPASE", "AMYLASE" in the last 168 hours. No results for input(s): "AMMONIA" in the last 168 hours. Coagulation Profile: No results for input(s): "INR", "PROTIME" in the last 168 hours. Cardiac Enzymes: No results for input(s): "CKTOTAL", "CKMB", "CKMBINDEX", "TROPONINI" in the last 168 hours. BNP (last 3 results) No results for input(s): "PROBNP" in the last 8760 hours. HbA1C: No results for input(s): "HGBA1C" in the last 72 hours. CBG: Recent Labs  Lab 03/14/22 1143 03/14/22 1654 03/14/22 2136 03/15/22 0746 03/15/22 1149  GLUCAP 141* 168* 131* 137* 128*   Lipid Profile: No results for input(s): "CHOL", "HDL", "LDLCALC", "TRIG", "CHOLHDL", "LDLDIRECT" in the last 72 hours. Thyroid Function Tests: No results for input(s): "TSH", "T4TOTAL", "FREET4", "T3FREE", "THYROIDAB" in the last 72 hours. Anemia Panel: No results for input(s): "VITAMINB12", "FOLATE", "FERRITIN", "TIBC", "IRON", "RETICCTPCT" in the last 72 hours. Sepsis Labs: No results for input(s): "PROCALCITON", "LATICACIDVEN" in the last 168 hours.  Recent Results (from the past 240 hour(s))  SARS Coronavirus 2 by RT PCR (hospital order, performed in [facility] hospital lab) *cepheid  single result test* Anterior Nasal Swab     Status: None   Collection Time: 03/14/22 12:51 AM   Specimen: Anterior Nasal Swab  Result Value Ref Range Status   SARS Coronavirus 2 by RT PCR NEGATIVE NEGATIVE Final    Comment: (NOTE) SARS-CoV-2 target nucleic acids are NOT DETECTED.  The SARS-CoV-2 RNA is generally detectable in upper and lower respiratory specimens during the acute phase of infection. The lowest concentration of SARS-CoV-2 viral copies this assay can detect is 250 copies / mL. A negative result does not preclude SARS-CoV-2 infection and should not be used as the sole basis for treatment or other patient management decisions.  A negative result may occur with improper specimen collection / handling, submission of specimen other than nasopharyngeal swab, presence of viral mutation(s) within the areas targeted by this assay, and inadequate number of viral copies (<250 copies / mL). A negative result must be combined with clinical observations, patient history, and epidemiological information.  Fact Sheet for Patients:   https://www.patel.info/  Fact Sheet for Healthcare Providers: https://hall.com/  This test is not yet approved or  cleared by the [place] FDA and has been authorized for detection and/or diagnosis of SARS-CoV-2 by FDA under an Emergency Use Authorization (EUA).  This EUA will remain in effect (meaning this test can be used) for the duration of the COVID-19 declaration under Section 564(b)(1) of the Act, 21 U.S.C. section 360bbb-3(b)(1), unless the authorization is terminated or revoked sooner.  Performed at [facility], [place] [address] [place],  [address]      Radiology Studies: VAS [place] UPPER EXTREMITY VENOUS DUPLEX  Result Date: 03/15/2022 UPPER VENOUS STUDY  Patient Name:  [facility]  Date of Exam:   03/15/2022 Medical Rec #: [id number]        Accession #:    [id number]  Date of Birth: 1961/07/22        Patient Gender: F Patient Age:   16 years Exam Location:  [facility] Procedure:      VAS [place] UPPER EXTREMITY VENOUS DUPLEX Referring Phys: DAWOOD ELGERGAWY --------------------------------------------------------------------------------  Indications: Edema Risk Factors: None identified. Comparison Study: No prior studies. Performing Technologist: Oliver Hum RVT  Examination Guidelines: A complete evaluation includes B-mode imaging, spectral Doppler, color Doppler, and power Doppler as needed of all accessible portions of each  vessel. Bilateral testing is considered an integral part of a complete examination. Limited examinations for reoccurring indications may be performed as noted.  Right Findings: +----------+------------+---------+-----------+----------+-------+ RIGHT     CompressiblePhasicitySpontaneousPropertiesSummary +----------+------------+---------+-----------+----------+-------+ IJV           Full       Yes       Yes                      +----------+------------+---------+-----------+----------+-------+ Subclavian    Full       Yes       Yes                      +----------+------------+---------+-----------+----------+-------+ Axillary      Full       Yes       Yes                      +----------+------------+---------+-----------+----------+-------+ Brachial      Full       Yes       Yes                      +----------+------------+---------+-----------+----------+-------+ Radial        Full                                          +----------+------------+---------+-----------+----------+-------+ Ulnar         Full                                          +----------+------------+---------+-----------+----------+-------+ Cephalic      Full                                          +----------+------------+---------+-----------+----------+-------+ Basilic       Full                                           +----------+------------+---------+-----------+----------+-------+  Left Findings: +----------+------------+---------+-----------+----------+-------+ LEFT      CompressiblePhasicitySpontaneousPropertiesSummary +----------+------------+---------+-----------+----------+-------+ IJV           Full       Yes       Yes                      +----------+------------+---------+-----------+----------+-------+ Subclavian    Full       Yes       Yes                      +----------+------------+---------+-----------+----------+-------+ Axillary      Full       Yes       Yes                      +----------+------------+---------+-----------+----------+-------+ Brachial      Full       Yes       Yes                      +----------+------------+---------+-----------+----------+-------+ Radial  Full                                          +----------+------------+---------+-----------+----------+-------+ Ulnar         Full                                          +----------+------------+---------+-----------+----------+-------+ Cephalic      Full                                          +----------+------------+---------+-----------+----------+-------+ Basilic       Full                                          +----------+------------+---------+-----------+----------+-------+  Summary:  Right: No evidence of deep vein thrombosis in the upper extremity. No evidence of superficial vein thrombosis in the upper extremity.  Left: No evidence of deep vein thrombosis in the upper extremity. No evidence of superficial vein thrombosis in the upper extremity.  *See table(s) above for measurements and observations.    Preliminary    NM Pulmonary Perfusion  Result Date: 03/14/2022 CLINICAL DATA:  Concern for thrombus in the main pulmonary artery on echocardiography. EXAM: NUCLEAR MEDICINE PERFUSION LUNG SCAN TECHNIQUE: Perfusion images were obtained in  multiple projections after intravenous injection of radiopharmaceutical. RADIOPHARMACEUTICALS:  4.4 mCi Tc-35m MAA IV COMPARISON:  Chest radiograph 03/13/2022 FINDINGS: Normal perfusion to both lungs. No large wedge-shaped or peripheral perfusion abnormalities. IMPRESSION: Normal perfusion in both lungs.  No evidence for pulmonary embolism. Electronically Signed   By: Markus Daft M.D.   On: 03/14/2022 16:57   ECHOCARDIOGRAM LIMITED  Result Date: 03/14/2022    ECHOCARDIOGRAM LIMITED REPORT   Patient Name:   [facility] Date of Exam: 03/14/2022 Medical Rec #:  [id number]       Height:       67.5 in Accession #:    [id number]      Weight:       141.0 lb Date of Birth:  [birth year]       BSA:          1.753 m Patient Age:    89 years        BP:           186/82 mmHg Patient Gender: F               HR:           84 bpm. Exam Location:  Inpatient Procedure: Limited Echo Indications:    W10.27 Acute systolic (congestive) heart failure  History:        Patient has prior history of Echocardiogram examinations, most                 recent 11/08/2021. CHF; Risk Factors:Hypertension, Diabetes and                 Dyslipidemia.  Sonographer:    Raquel Sarna Senior RDCS Referring Phys: [id number] Darreld Mclean  Sonographer Comments: Exam performed sitting upright for comfort. IMPRESSIONS  1. LV walls have a speckled appearance. Left ventricular  ejection fraction, by estimation, is 40 to 45%. Left ventricular ejection fraction by 2D MOD biplane is 44.5 %. The left ventricle has mildly decreased function. The left ventricle demonstrates global hypokinesis. There is severe left ventricular hypertrophy.  2. No definitive tamponade physiology, although the RV appears underfilled. a small pericardial effusion is present. The pericardial effusion is circumferential.  3. The aortic valve is tricuspid. Aortic valve regurgitation is not visualized.  4. Suggestion of possible thrombus in the main pulmonary artery at the PA bifurcation, which  appears dilated (image 4). mildly dilated pulmonary artery.  5. The inferior vena cava is normal in size with <50% respiratory variability, suggesting right atrial pressure of 8 mmHg. Comparison(s): Changes from prior study are noted. 11/08/2021: LVEF 55-60%, severe LVH, speckled myocardium. Conclusion(s)/Recommendation(s): Cannot exclude possible thrombus at the PA bifurcation - consider further imaging with V/Q scan. Critical findings reported to Dr. Gardiner Rhyme and acknowledged at 2:43 pm. FINDINGS  Left Ventricle: LV walls have a speckled appearance. Left ventricular ejection fraction, by estimation, is 40 to 45%. Left ventricular ejection fraction by 2D MOD biplane is 44.5 %. The left ventricle has mildly decreased function. The left ventricle demonstrates global hypokinesis. There is severe left ventricular hypertrophy. Pericardium: No definitive tamponade physiology, although the RV appears underfilled. A small pericardial effusion is present. The pericardial effusion is circumferential. There is diastolic collapse of the right ventricular free wall and diastolic collapse of the right atrial wall. Aortic Valve: The aortic valve is tricuspid. Aortic valve regurgitation is not visualized. Pulmonary Artery: Suggestion of possible thrombus in the main pulmonary artery at the PA bifurcation, which appears dilated (image 4). The pulmonary artery is mildly dilated. Venous: The inferior vena cava is normal in size with less than 50% respiratory variability, suggesting right atrial pressure of 8 mmHg.  LV Volumes (MOD)               Biplane EF (MOD) LV vol d, MOD    90.1 ml       LV Biplane EF:   Left A2C:                                            ventricular LV vol d, MOD    103.0 ml                       ejection A4C:                                            fraction by LV vol s, MOD    48.4 ml                        2D MOD A2C:                                            biplane is LV vol s, MOD    56.2 ml                         44.5 %. A4C: LV SV MOD A2C:   41.7 ml LV  SV MOD A4C:   103.0 ml LV SV MOD BP:    43.0 ml Lyman Bishop MD Electronically signed by Lyman Bishop MD Signature Date/Time: 03/14/2022/2:45:58 PM    Final    US RENAL  Result Date: 03/14/2022 CLINICAL DATA:  61 year old female with acute renal insufficiency. EXAM: RENAL / URINARY TRACT ULTRASOUND COMPLETE COMPARISON:  Renal ultrasound 01/30/2022 and CT Chest, Abdomen, and Pelvis 11/06/2021. FINDINGS: Right Kidney: Renal measurements: 13.4 x 6.2 x 6.6 cm = volume: 289 mL. Echogenic right kidney (image 2), similar to last month. No solid right renal mass or hydronephrosis. Small benign upper pole cyst (no follow-up imaging recommended). Left Kidney: Renal measurements: 12.2 x 7.9 x 6.5 cm = volume: 328 mL. Echogenic left kidney similar to the last month. No solid left renal mass or hydronephrosis. Bladder: Appears normal for degree of bladder distention. Other: None. IMPRESSION: Chronic medical renal disease. No acute renal finding. Electronically Signed   By: Genevie Ann M.D.   On: 03/14/2022 07:17   DG Chest 2 View  Result Date: 03/13/2022 CLINICAL DATA:  Shortness of breath EXAM: CHEST - 2 VIEW COMPARISON:  12/18/2021 FINDINGS: Cardiomegaly. Small bilateral pleural effusions. The visualized skeletal structures are unremarkable. IMPRESSION: Cardiomegaly and small bilateral pleural effusions. Electronically Signed   By: Delanna Ahmadi M.D.   On: 03/13/2022 17:08    Scheduled Meds:  amLODipine  10 mg Oral Daily   furosemide  80 mg Intravenous BID   heparin  5,000 Units Subcutaneous Q8H   hydrALAZINE  50 mg Oral Q8H   insulin aspart  0-5 Units Subcutaneous QHS   insulin aspart  0-9 Units Subcutaneous TID WC   isosorbide mononitrate  30 mg Oral Daily   potassium chloride  40 mEq Oral Once   Continuous Infusions:   LOS: 1 day   Darliss Cheney, MD [facility]  03/15/2022, 12:52 PM   *Please note that this is a verbal dictation therefore  any spelling or grammatical errors are due to the "[place]" system interpretation.  Please page via [place] and do not message via secure chat for urgent patient care matters. Secure chat can be used for non urgent patient care matters.  How to contact the [facility] Attending or Consulting provider [place] or covering provider during after hours [place], for this patient?  Check the care team in [facility] and look for a) attending/consulting [facility] provider listed and b) the [facility] team listed. Page or secure chat [phone]. Log into [email or web address] and use [place] universal password to access. If you do not have the password, please contact the hospital operator. Locate the [facility] provider you are looking for under [facility] and page to a number that you can be directly reached. If you still have difficulty reaching the provider, please page the [facility] (Director on Call) for the Hospitalists listed on amion for assistance.

## 2022-03-15 NOTE — Progress Notes (Addendum)
Progress Note  Patient Name: Amber Cross Date of Encounter: 03/15/2022  [facility] Cardiologist: Berniece Salines, DO   Subjective   Reports dyspnea improved.  Inpatient Medications    Scheduled Meds:  amLODipine  10 mg Oral Daily   furosemide  80 mg Intravenous BID   heparin  5,000 Units Subcutaneous Q8H   hydrALAZINE  50 mg Oral Q8H   insulin aspart  0-5 Units Subcutaneous QHS   insulin aspart  0-9 Units Subcutaneous TID WC   Continuous Infusions:  PRN Meds: hydrALAZINE   Vital Signs    Vitals:   03/14/22 2058 03/15/22 0216 03/15/22 0708 03/15/22 0938  BP: (!) 162/76 (!) 172/74 (!) 181/82 (!) 159/66  Pulse: 86 94 82   Resp: 18 20 20    Temp: 98.6 F (37 C) 98.4 F (36.9 C) 98.2 F (36.8 C)   TempSrc: Oral Oral Oral   SpO2: 94% 94% 96%   Weight:   63.2 kg   Height:        Intake/Output Summary (Last 24 hours) at 03/15/2022 1039 Last data filed at 03/15/2022 1000 Gross per 24 hour  Intake 240 ml  Output 0 ml  Net 240 ml      03/15/2022    7:08 AM 03/13/2022    4:22 PM 02/28/2022    3:29 PM  Last 3 Weights  Weight (lbs) 139 lb 5.3 oz 141 lb 147 lb 11.2 oz  Weight (kg) 63.2 kg 63.957 kg 66.996 kg      Telemetry    Normal sinus rhythm- Personally Reviewed  ECG    No new ECG- Personally Reviewed  Physical Exam   GEN: No acute distress.   Neck: + JVD Cardiac: RRR, no murmurs, rubs, or gallops.  Respiratory: Diminished breath sounds GI: Soft, nontender, non-distended  MS: 1+ BLE edema Neuro:  Nonfocal  Psych: Normal affect   Labs    High Sensitivity Troponin:   Recent Labs  Lab 03/13/22 1713 03/13/22 2219  TROPONINIHS 19* 20*     Chemistry Recent Labs  Lab 03/13/22 2219 03/14/22 0111 03/14/22 0532 03/15/22 0558  NA 141  --  142 142  K 2.7*  --  2.9* 3.2*  CL 112*  --  112* 113*  CO2 18*  --  22 21*  GLUCOSE 221*  --  153* 139*  BUN 37*  --  37* 34*  CREATININE 3.59*  --  3.64* 3.39*  CALCIUM 7.6*  --  8.2* 8.0*  MG  --   1.7  --   --   PROT 5.5*  --   --   --   ALBUMIN 2.1*  --   --  2.2*  AST 14*  --   --   --   ALT 15  --   --   --   ALKPHOS 142*  --   --   --   BILITOT 0.3  --   --   --   GFRNONAA 14*  --  14* 15*  ANIONGAP 11  --  8 8    Lipids No results for input(s): "CHOL", "TRIG", "HDL", "LABVLDL", "LDLCALC", "CHOLHDL" in the last 168 hours.  Hematology Recent Labs  Lab 03/13/22 1713 03/15/22 0558  WBC 8.1 7.4  RBC 2.99* 2.99*  HGB 8.5* 8.4*  HCT 26.4* 26.2*  MCV 88.3 87.6  MCH 28.4 28.1  MCHC 32.2 32.1  RDW 15.9* 15.6*  PLT 342 340   Thyroid No results for input(s): "TSH", "FREET4" in the  last 168 hours.  BNP Recent Labs  Lab 03/13/22 1713  BNP 438.8*    DDimer No results for input(s): "DDIMER" in the last 168 hours.   Radiology    VAS [place] UPPER EXTREMITY VENOUS DUPLEX  Result Date: 03/15/2022 UPPER VENOUS STUDY  Patient Name:  Amber Cross [facility]  Date of Exam:   03/15/2022 Medical Rec #: [id number]        Accession #:    [id number] Date of Birth: 02-Sep-1960        Patient Gender: F Patient Age:   61 years Exam Location:  [facility] [place] Procedure:      VAS [place] UPPER EXTREMITY VENOUS DUPLEX Referring Phys: DAWOOD ELGERGAWY --------------------------------------------------------------------------------  Indications: Edema Risk Factors: None identified. Comparison Study: No prior studies. Performing Technologist: Oliver Hum RVT  Examination Guidelines: A complete evaluation includes B-mode imaging, spectral Doppler, color Doppler, and power Doppler as needed of all accessible portions of each vessel. Bilateral testing is considered an integral part of a complete examination. Limited examinations for reoccurring indications may be performed as noted.  Right Findings: +----------+------------+---------+-----------+----------+-------+ RIGHT     CompressiblePhasicitySpontaneousPropertiesSummary +----------+------------+---------+-----------+----------+-------+ IJV           Full        Yes       Yes                      +----------+------------+---------+-----------+----------+-------+ Subclavian    Full       Yes       Yes                      +----------+------------+---------+-----------+----------+-------+ Axillary      Full       Yes       Yes                      +----------+------------+---------+-----------+----------+-------+ Brachial      Full       Yes       Yes                      +----------+------------+---------+-----------+----------+-------+ Radial        Full                                          +----------+------------+---------+-----------+----------+-------+ Ulnar         Full                                          +----------+------------+---------+-----------+----------+-------+ Cephalic      Full                                          +----------+------------+---------+-----------+----------+-------+ Basilic       Full                                          +----------+------------+---------+-----------+----------+-------+  Left Findings: +----------+------------+---------+-----------+----------+-------+ LEFT      CompressiblePhasicitySpontaneousPropertiesSummary +----------+------------+---------+-----------+----------+-------+ IJV           Full  Yes       Yes                      +----------+------------+---------+-----------+----------+-------+ Subclavian    Full       Yes       Yes                      +----------+------------+---------+-----------+----------+-------+ Axillary      Full       Yes       Yes                      +----------+------------+---------+-----------+----------+-------+ Brachial      Full       Yes       Yes                      +----------+------------+---------+-----------+----------+-------+ Radial        Full                                          +----------+------------+---------+-----------+----------+-------+ Ulnar          Full                                          +----------+------------+---------+-----------+----------+-------+ Cephalic      Full                                          +----------+------------+---------+-----------+----------+-------+ Basilic       Full                                          +----------+------------+---------+-----------+----------+-------+  Summary:  Right: No evidence of deep vein thrombosis in the upper extremity. No evidence of superficial vein thrombosis in the upper extremity.  Left: No evidence of deep vein thrombosis in the upper extremity. No evidence of superficial vein thrombosis in the upper extremity.  *See table(s) above for measurements and observations.    Preliminary    NM Pulmonary Perfusion  Result Date: 03/14/2022 CLINICAL DATA:  Concern for thrombus in the main pulmonary artery on echocardiography. EXAM: NUCLEAR MEDICINE PERFUSION LUNG SCAN TECHNIQUE: Perfusion images were obtained in multiple projections after intravenous injection of radiopharmaceutical. RADIOPHARMACEUTICALS:  4.4 mCi Tc-36m MAA IV COMPARISON:  Chest radiograph 03/13/2022 FINDINGS: Normal perfusion to both lungs. No large wedge-shaped or peripheral perfusion abnormalities. IMPRESSION: Normal perfusion in both lungs.  No evidence for pulmonary embolism. Electronically Signed   By: Markus Daft M.D.   On: 03/14/2022 16:57   ECHOCARDIOGRAM LIMITED  Result Date: 03/14/2022    ECHOCARDIOGRAM LIMITED REPORT   Patient Name:   [facility] Date of Exam: 03/14/2022 Medical Rec #:  [id number]       Height:       67.5 in Accession #:    [id number]      Weight:       141.0 lb Date of Birth:  11-27-1960       BSA:          1.753 m  Patient Age:    9 years        BP:           186/82 mmHg Patient Gender: F               HR:           84 bpm. Exam Location:  Inpatient Procedure: Limited Echo Indications:    E52.77 Acute systolic (congestive) heart failure  History:        Patient has prior  history of Echocardiogram examinations, most                 recent 11/08/2021. CHF; Risk Factors:Hypertension, Diabetes and                 Dyslipidemia.  Sonographer:    Raquel Sarna Senior RDCS Referring Phys: [id number] Darreld Mclean  Sonographer Comments: Exam performed sitting upright for comfort. IMPRESSIONS  1. LV walls have a speckled appearance. Left ventricular ejection fraction, by estimation, is 40 to 45%. Left ventricular ejection fraction by 2D MOD biplane is 44.5 %. The left ventricle has mildly decreased function. The left ventricle demonstrates global hypokinesis. There is severe left ventricular hypertrophy.  2. No definitive tamponade physiology, although the RV appears underfilled. a small pericardial effusion is present. The pericardial effusion is circumferential.  3. The aortic valve is tricuspid. Aortic valve regurgitation is not visualized.  4. Suggestion of possible thrombus in the main pulmonary artery at the PA bifurcation, which appears dilated (image 4). mildly dilated pulmonary artery.  5. The inferior vena cava is normal in size with <50% respiratory variability, suggesting right atrial pressure of 8 mmHg. Comparison(s): Changes from prior study are noted. 11/08/2021: LVEF 55-60%, severe LVH, speckled myocardium. Conclusion(s)/Recommendation(s): Cannot exclude possible thrombus at the PA bifurcation - consider further imaging with V/Q scan. Critical findings reported to Dr. Gardiner Rhyme and acknowledged at 2:43 pm. FINDINGS  Left Ventricle: LV walls have a speckled appearance. Left ventricular ejection fraction, by estimation, is 40 to 45%. Left ventricular ejection fraction by 2D MOD biplane is 44.5 %. The left ventricle has mildly decreased function. The left ventricle demonstrates global hypokinesis. There is severe left ventricular hypertrophy. Pericardium: No definitive tamponade physiology, although the RV appears underfilled. A small pericardial effusion is present. The pericardial  effusion is circumferential. There is diastolic collapse of the right ventricular free wall and diastolic collapse of the right atrial wall. Aortic Valve: The aortic valve is tricuspid. Aortic valve regurgitation is not visualized. Pulmonary Artery: Suggestion of possible thrombus in the main pulmonary artery at the PA bifurcation, which appears dilated (image 4). The pulmonary artery is mildly dilated. Venous: The inferior vena cava is normal in size with less than 50% respiratory variability, suggesting right atrial pressure of 8 mmHg.  LV Volumes (MOD)               Biplane EF (MOD) LV vol d, MOD    90.1 ml       LV Biplane EF:   Left A2C:                                            ventricular LV vol d, MOD    103.0 ml                       ejection A4C:  fraction by LV vol s, MOD    48.4 ml                        2D MOD A2C:                                            biplane is LV vol s, MOD    56.2 ml                        44.5 %. A4C: LV SV MOD A2C:   41.7 ml LV SV MOD A4C:   103.0 ml LV SV MOD BP:    43.0 ml Lyman Bishop MD Electronically signed by Lyman Bishop MD Signature Date/Time: 03/14/2022/2:45:58 PM    Final    US RENAL  Result Date: 03/14/2022 CLINICAL DATA:  60 year old female with acute renal insufficiency. EXAM: RENAL / URINARY TRACT ULTRASOUND COMPLETE COMPARISON:  Renal ultrasound 01/30/2022 and CT Chest, Abdomen, and Pelvis 11/06/2021. FINDINGS: Right Kidney: Renal measurements: 13.4 x 6.2 x 6.6 cm = volume: 289 mL. Echogenic right kidney (image 2), similar to last month. No solid right renal mass or hydronephrosis. Small benign upper pole cyst (no follow-up imaging recommended). Left Kidney: Renal measurements: 12.2 x 7.9 x 6.5 cm = volume: 328 mL. Echogenic left kidney similar to the last month. No solid left renal mass or hydronephrosis. Bladder: Appears normal for degree of bladder distention. Other: None. IMPRESSION: Chronic medical renal  disease. No acute renal finding. Electronically Signed   By: Genevie Ann M.D.   On: 03/14/2022 07:17   DG Chest 2 View  Result Date: 03/13/2022 CLINICAL DATA:  Shortness of breath EXAM: CHEST - 2 VIEW COMPARISON:  12/18/2021 FINDINGS: Cardiomegaly. Small bilateral pleural effusions. The visualized skeletal structures are unremarkable. IMPRESSION: Cardiomegaly and small bilateral pleural effusions. Electronically Signed   By: Delanna Ahmadi M.D.   On: 03/13/2022 17:08    Cardiac Studies     Patient Profile     61 y.o. female with a history of chronic combined heart failure, CKD stage IV, CVA, small cell lung cancer, tobacco use, hypertension who we are consulted for evaluation of heart failure   Assessment & Plan    Anasarca Nephrotic syndrome Acute on Chronic Combined CHF  Patient has a history of chronic combined CHF with a EF as low as 25% in 04/2021 but has since normalized.  Last echo in/2023 showed LVEF of 55-60% with severe concentric LVH and grade 1 diastolic dysfunction, normal RV, moderately dilated left atrium. There has been concern for amyloidosis in the past given bright speckled appearance of LV endocardium and other Echo findings. However, PYP scan in 04/2021 was equivocal for transthyretin amyloidosis and cardiac MRI in 08/2021 at [facility] showed no evidence of infiltrative disease.  Cardiomyopathy has been felt to be non-ischemic in the past given prior stress test in 12/2020 showed no evidence of ischemia or prior infarct.  Patient now presents with worsening shortness of breath and lower extremity edema and was found to be significantly volume overloaded on presentation.  BNP elevated at 438.  Chest x-ray shows cardiomegaly and small bilateral pleural effusions.  She was given 1 dose of IV Lasix 40 mg in the ED with no documented urinary output.  Creatinine 3.59 and potassium 2.7 on admission. - Patient is volume  overloaded on exam. - Echo this admission shows EF 40 to 45% - Continue  diuresis per nephrology - Will hold off on resuming beta-blocker for now in the setting of decompensated CHF  - No ACEi/ARB/ARNI or MRA due to renal function. - No SGLT2 inhibitor right now due to renal function  - Continue hydralazine 50 mg 3 times daily, will add Imdur 30 mg daily - Continue to monitor daily weights, strict I's and O's, and renal function. Dietary compliance is an issue for her, needs to limit sodium intake to 2000mg  a day.   ?Thrombus on echo There was concern for possible thrombus in main pulmonary artery on echo, though not well visualized.  VQ scan shows no evidence of PE  Chest Pain Patient has constant chest pain that she describes as a squeezing sensation. She has had this for months and it is unchanged. She has this 24/7. Not worse with exertion. EKG shows no acute ischemic changes.  High-sensitivity troponin 19 >> 20.   - She continues to have 7/10 pain now but does not appear to be in any distress. - Symptoms very atypical. Do not think this is cardiac in nature. No ischemic work-up necessary at this time. Symptoms may improve with management of fluid status.   Hypertensive Urgency BP as high as 207/90 on admission.  Home regimen includes Amlodipine 10 mg daily, Coreg 25 mg daily, Hydralazine 50 mg three times daily.  Most recent BP improved with systolic BP in the 983J -Can continue amlodipine 10mg  daily and Hydralazine 50mg  three time daily.  Add Imdur 30 mg daily - Will hold off on restarting Coreg at this time given decompensated CHF.   Hyperlipidemia - Continue home Crestor 40mg  daily.   Acute on CKD Stage IV Creatinine 3.59 on admission and is 3.39 today. Creatinine during recent admission last month peaked at 3.66 but then improved. Last creatinine on 02/20/2022 was 2.8. Renal artery ultrasound last month showed no evidence of renal artery stenosis. - Continue to avoid nephrotoxic agents. -Continue diuresis per nephrology   Hypokalemia Potassium 2.7 on  arrival. Patient reports chronic diarrhea which may be the cause. - Continue supplementation per primary team.   Prolonged QTc QTc 528 ms on initial EKG and 504 ms on repeat EKG yesterday. This is in the setting of significant hypokalemia as above. Magnesium 1.7. - Continue to supplement potassium. - Repeat EKG    Otherwise, per primary team: - Small cell lung cancer - Poorly controlled type 2 diabetes mellitus - Metabolic acidosis - CVA - Chronic anemia  For questions or updates, please contact [facility] Please consult [email or web address] for contact info under        Signed, Donato Heinz, MD  03/15/2022, 10:39 AM

## 2022-03-15 NOTE — Progress Notes (Addendum)
Mobility Specialist - Progress Note   03/15/22 1224  Mobility  HOB Elevated/Bed Position Self regulated  Activity Ambulated independently in hallway  Range of Motion/Exercises Active  Level of Assistance Modified independent, requires aide device or extra time  Assistive Device Front wheel walker  Distance Ambulated (ft) 250 ft  Activity Response Tolerated well  Transport method Ambulatory  $Mobility charge 1 Mobility   Pt received in bed  and agreeable to mobility. C/o chest pain prior to ambulation.  Pt to recliner after session with all needs met.    Pre-mobility:  102bpm HR, 93% SpO2 During mobility: 111bpm HR, 93% SpO2 Post-mobility:  103bpm HR, 96% SPO2  Set designer

## 2022-03-15 NOTE — TOC Progression Note (Signed)
Transition of Care [facility]) - Progression Note    Patient Details  Name: Amber Cross MRN: [id number] Date of Birth: [birth year]  [facility]) CM/SW Contact  Purcell Mouton, RN Phone Number: 03/15/2022, 10:46 AM  Clinical Narrative:     Pt from home. PT to eval.   Expected Discharge Plan: [place] Barriers to Discharge: No Barriers Identified  Expected Discharge Plan and Services Expected Discharge Plan: [place] Choice: [place] arrangements for the past 2 months: Single Family Home                                       Social Determinants of Health (SDOH) Interventions    Readmission Risk Interventions     No data to display

## 2022-03-15 NOTE — Progress Notes (Signed)
Bilateral upper extremity venous duplex has been completed. Preliminary results can be found in CV Proc through chart review.   03/15/22 9:27 AM Amber Cross RVT

## 2022-03-15 NOTE — Progress Notes (Signed)
[facility] Progress Note    Assessment/ Plan:   Amber Cross is an 61 y.o. female with DM, HTN, SLCL s/p XRT, h/o CVA, HFrEF, CKD currently hospitalized with HTN, A/C HFrEF who nephrology is consulted regarding AKI on CKD and volume overload.   AKI on CKD with nephrotic syndrome:  fairly rapid progression of CKD over the past ~1 year with evidence of nephrotic syndrome.  Serologies have been unrevealing except mildly elevated K/L FLC ratio but SPEP no M spike.  Certainly could be diabetic kidney disease with her history but the course is fairly rapid.   -Cr slightly improved today w/ diuretics -Plan is to optimize her volume status over the weekend and anticipate renal biopsy hopefully on Monday. She does have a biopsy planned as an outpatient later this month but I do believe that she should get this done while she is inpatient. If volume status is acceptable, would tentatively plan for biopsy on Monday -No indication for renal replacement therapy at this time -Avoid nephrotoxic medications including NSAIDs and iodinated intravenous contrast exposure unless the latter is absolutely indicated.  Preferred narcotic agents for pain control are hydromorphone, fentanyl, and methadone. Morphine should not be used. Avoid Baclofen and avoid oral sodium phosphate and magnesium citrate based laxatives / bowel preps. Continue strict Input and Output monitoring. Will monitor the patient closely with you and intervene or adjust therapy as indicated by changes in clinical status/labs    Acute on Chronic HFrEF:   -cardiology following. GDMT limited by GFR.  Diuresis needed -- start with lasix 80 IV BID and adjust as needed.  Strict I/Os, daily standing weights ordered   Low na diet.  Recommend purewick for accurate UOP monitoring   Hypokalemia -improved to 3.2, continue to replete PRN   Anemia of chronic disease -hgb 8.4. Iron studies ordered   HTN -currently amlodipine 10, hydralazine  50 TID and diuresis   DM2, uncontrolled -longstanding poor control, recent A1c 9.7, insulin per primary. No RAASi or SGLT2i with GFR.   Subjective:   Weight down from 64kg on 8/16 to 63.2kg Patient seen and examined in room. Son at bedside as well. Patient reports feeling better today, breathing is improving, feels like swelling came down. Otherwise denies any fevers, chest pain, n/v, loss of appetite, dysgeusia, new shakes/tremors, brain fog. Intractable hiccups/pruritis.   Objective:   BP (!) 159/66   Pulse 82   Temp 98.2 F (36.8 C) (Oral)   Resp 20   Ht 5' 7.5" (1.715 m)   Wt 63.2 kg   LMP 09/29/2011   SpO2 96%   BMI 21.50 kg/m   Intake/Output Summary (Last 24 hours) at 03/15/2022 1243 Last data filed at 03/15/2022 1000 Gross per 24 hour  Intake 240 ml  Output 0 ml  Net 240 ml   Weight change:   Physical Exam: Gen:NAD CVS:RRR Resp:decreased bibasilar breath sounds, no w/r/r/c appreciated, normal WOB GQQ:PYPP, nt/nd Ext:1-2+ pitting edema b/l Les Neuro: awake, alert  Imaging: VAS [place] UPPER EXTREMITY VENOUS DUPLEX  Result Date: 03/15/2022 UPPER VENOUS STUDY  Patient Name:  Amber Cross [facility]  Date of Exam:   03/15/2022 Medical Rec #: [id number]        Accession #:    [id number] Date of Birth: [birth year]        Patient Gender: F Patient Age:   76 years Exam Location:  [facility] Procedure:      VAS [place] UPPER EXTREMITY VENOUS DUPLEX Referring Phys: Amber Cross --------------------------------------------------------------------------------  Indications: Edema Risk Factors: None identified. Comparison Study: No prior studies. Performing Technologist: Oliver Hum RVT  Examination Guidelines: A complete evaluation includes B-mode imaging, spectral Doppler, color Doppler, and power Doppler as needed of all accessible portions of each vessel. Bilateral testing is considered an integral part of a complete examination. Limited examinations for reoccurring indications may  be performed as noted.  Right Findings: +----------+------------+---------+-----------+----------+-------+ RIGHT     CompressiblePhasicitySpontaneousPropertiesSummary +----------+------------+---------+-----------+----------+-------+ IJV           Full       Yes       Yes                      +----------+------------+---------+-----------+----------+-------+ Subclavian    Full       Yes       Yes                      +----------+------------+---------+-----------+----------+-------+ Axillary      Full       Yes       Yes                      +----------+------------+---------+-----------+----------+-------+ Brachial      Full       Yes       Yes                      +----------+------------+---------+-----------+----------+-------+ Radial        Full                                          +----------+------------+---------+-----------+----------+-------+ Ulnar         Full                                          +----------+------------+---------+-----------+----------+-------+ Cephalic      Full                                          +----------+------------+---------+-----------+----------+-------+ Basilic       Full                                          +----------+------------+---------+-----------+----------+-------+  Left Findings: +----------+------------+---------+-----------+----------+-------+ LEFT      CompressiblePhasicitySpontaneousPropertiesSummary +----------+------------+---------+-----------+----------+-------+ IJV           Full       Yes       Yes                      +----------+------------+---------+-----------+----------+-------+ Subclavian    Full       Yes       Yes                      +----------+------------+---------+-----------+----------+-------+ Axillary      Full       Yes       Yes                      +----------+------------+---------+-----------+----------+-------+ Brachial  Full       Yes       Yes                      +----------+------------+---------+-----------+----------+-------+ Radial        Full                                          +----------+------------+---------+-----------+----------+-------+ Ulnar         Full                                          +----------+------------+---------+-----------+----------+-------+ Cephalic      Full                                          +----------+------------+---------+-----------+----------+-------+ Basilic       Full                                          +----------+------------+---------+-----------+----------+-------+  Summary:  Right: No evidence of deep vein thrombosis in the upper extremity. No evidence of superficial vein thrombosis in the upper extremity.  Left: No evidence of deep vein thrombosis in the upper extremity. No evidence of superficial vein thrombosis in the upper extremity.  *See table(s) above for measurements and observations.    Preliminary    NM Pulmonary Perfusion  Result Date: 03/14/2022 CLINICAL DATA:  Concern for thrombus in the main pulmonary artery on echocardiography. EXAM: NUCLEAR MEDICINE PERFUSION LUNG SCAN TECHNIQUE: Perfusion images were obtained in multiple projections after intravenous injection of radiopharmaceutical. RADIOPHARMACEUTICALS:  4.4 mCi Tc-34m MAA IV COMPARISON:  Chest radiograph 03/13/2022 FINDINGS: Normal perfusion to both lungs. No large wedge-shaped or peripheral perfusion abnormalities. IMPRESSION: Normal perfusion in both lungs.  No evidence for pulmonary embolism. Electronically Signed   By: Markus Daft M.D.   On: 03/14/2022 16:57   ECHOCARDIOGRAM LIMITED  Result Date: 03/14/2022    ECHOCARDIOGRAM LIMITED REPORT   Patient Name:   [facility] Date of Exam: 03/14/2022 Medical Rec #:  [id number]       Height:       67.5 in Accession #:    [id number]      Weight:       141.0 lb Date of Birth:  [birth year]       BSA:          1.753  m Patient Age:    17 years        BP:           186/82 mmHg Patient Gender: F               HR:           84 bpm. Exam Location:  Inpatient Procedure: Limited Echo Indications:    V42.59 Acute systolic (congestive) heart failure  History:        Patient has prior history of Echocardiogram examinations, most                 recent 11/08/2021. CHF; Risk Factors:Hypertension, Diabetes and  Dyslipidemia.  Sonographer:    Raquel Sarna Senior RDCS Referring Phys: [id number] Darreld Mclean  Sonographer Comments: Exam performed sitting upright for comfort. IMPRESSIONS  1. LV walls have a speckled appearance. Left ventricular ejection fraction, by estimation, is 40 to 45%. Left ventricular ejection fraction by 2D MOD biplane is 44.5 %. The left ventricle has mildly decreased function. The left ventricle demonstrates global hypokinesis. There is severe left ventricular hypertrophy.  2. No definitive tamponade physiology, although the RV appears underfilled. a small pericardial effusion is present. The pericardial effusion is circumferential.  3. The aortic valve is tricuspid. Aortic valve regurgitation is not visualized.  4. Suggestion of possible thrombus in the main pulmonary artery at the PA bifurcation, which appears dilated (image 4). mildly dilated pulmonary artery.  5. The inferior vena cava is normal in size with <50% respiratory variability, suggesting right atrial pressure of 8 mmHg. Comparison(s): Changes from prior study are noted. 11/08/2021: LVEF 55-60%, severe LVH, speckled myocardium. Conclusion(s)/Recommendation(s): Cannot exclude possible thrombus at the PA bifurcation - consider further imaging with V/Q scan. Critical findings reported to Dr. Gardiner Rhyme and acknowledged at 2:43 pm. FINDINGS  Left Ventricle: LV walls have a speckled appearance. Left ventricular ejection fraction, by estimation, is 40 to 45%. Left ventricular ejection fraction by 2D MOD biplane is 44.5 %. The left ventricle has mildly  decreased function. The left ventricle demonstrates global hypokinesis. There is severe left ventricular hypertrophy. Pericardium: No definitive tamponade physiology, although the RV appears underfilled. A small pericardial effusion is present. The pericardial effusion is circumferential. There is diastolic collapse of the right ventricular free wall and diastolic collapse of the right atrial wall. Aortic Valve: The aortic valve is tricuspid. Aortic valve regurgitation is not visualized. Pulmonary Artery: Suggestion of possible thrombus in the main pulmonary artery at the PA bifurcation, which appears dilated (image 4). The pulmonary artery is mildly dilated. Venous: The inferior vena cava is normal in size with less than 50% respiratory variability, suggesting right atrial pressure of 8 mmHg.  LV Volumes (MOD)               Biplane EF (MOD) LV vol d, MOD    90.1 ml       LV Biplane EF:   Left A2C:                                            ventricular LV vol d, MOD    103.0 ml                       ejection A4C:                                            fraction by LV vol s, MOD    48.4 ml                        2D MOD A2C:                                            biplane is LV vol s, MOD    56.2 ml  44.5 %. A4C: LV SV MOD A2C:   41.7 ml LV SV MOD A4C:   103.0 ml LV SV MOD BP:    43.0 ml Lyman Bishop MD Electronically signed by Lyman Bishop MD Signature Date/Time: 03/14/2022/2:45:58 PM    Final    US RENAL  Result Date: 03/14/2022 CLINICAL DATA:  61 year old female with acute renal insufficiency. EXAM: RENAL / URINARY TRACT ULTRASOUND COMPLETE COMPARISON:  Renal ultrasound 01/30/2022 and CT Chest, Abdomen, and Pelvis 11/06/2021. FINDINGS: Right Kidney: Renal measurements: 13.4 x 6.2 x 6.6 cm = volume: 289 mL. Echogenic right kidney (image 2), similar to last month. No solid right renal mass or hydronephrosis. Small benign upper pole cyst (no follow-up imaging recommended). Left Kidney:  Renal measurements: 12.2 x 7.9 x 6.5 cm = volume: 328 mL. Echogenic left kidney similar to the last month. No solid left renal mass or hydronephrosis. Bladder: Appears normal for degree of bladder distention. Other: None. IMPRESSION: Chronic medical renal disease. No acute renal finding. Electronically Signed   By: Genevie Ann M.D.   On: 03/14/2022 07:17   DG Chest 2 View  Result Date: 03/13/2022 CLINICAL DATA:  Shortness of breath EXAM: CHEST - 2 VIEW COMPARISON:  12/18/2021 FINDINGS: Cardiomegaly. Small bilateral pleural effusions. The visualized skeletal structures are unremarkable. IMPRESSION: Cardiomegaly and small bilateral pleural effusions. Electronically Signed   By: Delanna Ahmadi M.D.   On: 03/13/2022 17:08    Labs: BMET Recent Labs  Lab 03/13/22 2219 03/14/22 0532 03/15/22 0558  NA 141 142 142  K 2.7* 2.9* 3.2*  CL 112* 112* 113*  CO2 18* 22 21*  GLUCOSE 221* 153* 139*  BUN 37* 37* 34*  CREATININE 3.59* 3.64* 3.39*  CALCIUM 7.6* 8.2* 8.0*  PHOS  --   --  4.7*   CBC Recent Labs  Lab 03/13/22 1713 03/15/22 0558  WBC 8.1 7.4  NEUTROABS 6.5  --   HGB 8.5* 8.4*  HCT 26.4* 26.2*  MCV 88.3 87.6  PLT 342 340    Medications:     amLODipine  10 mg Oral Daily   furosemide  80 mg Intravenous BID   heparin  5,000 Units Subcutaneous Q8H   hydrALAZINE  50 mg Oral Q8H   insulin aspart  0-5 Units Subcutaneous QHS   insulin aspart  0-9 Units Subcutaneous TID WC   isosorbide mononitrate  30 mg Oral Daily   potassium chloride  40 mEq Oral Once      Gean Quint, MD [facility] 03/15/2022, 12:43 PM

## 2022-03-15 NOTE — Plan of Care (Signed)
BP and SOB improving.  Pt reports no pain at this time.  Problem: Education: Goal: Ability to demonstrate management of disease process will improve Outcome: Progressing Goal: Ability to verbalize understanding of medication therapies will improve Outcome: Progressing Goal: Individualized Educational Video(s) Outcome: Progressing   Problem: Activity: Goal: Capacity to carry out activities will improve Outcome: Progressing   Problem: Cardiac: Goal: Ability to achieve and maintain adequate cardiopulmonary perfusion will improve Outcome: Progressing   Problem: Education: Goal: Ability to describe self-care measures that may prevent or decrease complications (Diabetes Survival Skills Education) will improve Outcome: Progressing Goal: Individualized Educational Video(s) Outcome: Progressing   Problem: Coping: Goal: Ability to adjust to condition or change in health will improve Outcome: Progressing   Problem: Fluid Volume: Goal: Ability to maintain a balanced intake and output will improve Outcome: Progressing   Problem: Health Behavior/Discharge Planning: Goal: Ability to identify and utilize available resources and services will improve Outcome: Progressing Goal: Ability to manage health-related needs will improve Outcome: Progressing   Problem: Metabolic: Goal: Ability to maintain appropriate glucose levels will improve Outcome: Progressing   Problem: Nutritional: Goal: Maintenance of adequate nutrition will improve Outcome: Progressing Goal: Progress toward achieving an optimal weight will improve Outcome: Progressing   Problem: Skin Integrity: Goal: Risk for impaired skin integrity will decrease Outcome: Progressing   Problem: Tissue Perfusion: Goal: Adequacy of tissue perfusion will improve Outcome: Progressing   Problem: Education: Goal: Knowledge of General Education information will improve Description: Including pain rating scale, medication(s)/side  effects and non-pharmacologic comfort measures Outcome: Progressing   Problem: Health Behavior/Discharge Planning: Goal: Ability to manage health-related needs will improve Outcome: Progressing   Problem: Clinical Measurements: Goal: Ability to maintain clinical measurements within normal limits will improve Outcome: Progressing Goal: Will remain free from infection Outcome: Progressing Goal: Diagnostic test results will improve Outcome: Progressing Goal: Respiratory complications will improve Outcome: Progressing Goal: Cardiovascular complication will be avoided Outcome: Progressing   Problem: Activity: Goal: Risk for activity intolerance will decrease Outcome: Progressing   Problem: Nutrition: Goal: Adequate nutrition will be maintained Outcome: Progressing   Problem: Coping: Goal: Level of anxiety will decrease Outcome: Progressing   Problem: Elimination: Goal: Will not experience complications related to bowel motility Outcome: Progressing Goal: Will not experience complications related to urinary retention Outcome: Progressing   Problem: Pain Managment: Goal: General experience of comfort will improve Outcome: Progressing   Problem: Safety: Goal: Ability to remain free from injury will improve Outcome: Progressing   Problem: Skin Integrity: Goal: Risk for impaired skin integrity will decrease Outcome: Progressing

## 2022-03-16 DIAGNOSIS — N179 Acute kidney failure, unspecified: Secondary | ICD-10-CM | POA: Diagnosis not present

## 2022-03-16 DIAGNOSIS — R079 Chest pain, unspecified: Secondary | ICD-10-CM | POA: Diagnosis not present

## 2022-03-16 DIAGNOSIS — I5043 Acute on chronic combined systolic (congestive) and diastolic (congestive) heart failure: Secondary | ICD-10-CM | POA: Diagnosis not present

## 2022-03-16 DIAGNOSIS — E8770 Fluid overload, unspecified: Secondary | ICD-10-CM | POA: Diagnosis not present

## 2022-03-16 DIAGNOSIS — I160 Hypertensive urgency: Secondary | ICD-10-CM | POA: Diagnosis not present

## 2022-03-16 LAB — BASIC METABOLIC PANEL
Anion gap: 7 (ref 5–15)
BUN: 34 mg/dL — ABNORMAL HIGH (ref 6–20)
CO2: 22 mmol/L (ref 22–32)
Calcium: 7.8 mg/dL — ABNORMAL LOW (ref 8.9–10.3)
Chloride: 110 mmol/L (ref 98–111)
Creatinine, Ser: 3.2 mg/dL — ABNORMAL HIGH (ref 0.44–1.00)
GFR, Estimated: 16 mL/min — ABNORMAL LOW (ref >60)
Glucose, Bld: 109 mg/dL — ABNORMAL HIGH (ref 70–99)
Potassium: 3.1 mmol/L — ABNORMAL LOW (ref 3.5–5.1)
Sodium: 139 mmol/L (ref 135–145)

## 2022-03-16 LAB — FERRITIN: Ferritin: 206 ng/mL (ref 11–307)

## 2022-03-16 LAB — GLUCOSE, CAPILLARY
Glucose-Capillary: 117 mg/dL — ABNORMAL HIGH (ref 70–99)
Glucose-Capillary: 177 mg/dL — ABNORMAL HIGH (ref 70–99)
Glucose-Capillary: 167 mg/dL — ABNORMAL HIGH (ref 70–99)
Glucose-Capillary: 185 mg/dL — ABNORMAL HIGH (ref 70–99)

## 2022-03-16 LAB — IRON AND TIBC
Iron: 50 ug/dL (ref 28–170)
Saturation Ratios: 24 % (ref 10.4–31.8)
TIBC: 211 ug/dL — ABNORMAL LOW (ref 250–450)
UIBC: 161 ug/dL

## 2022-03-16 MED ORDER — POTASSIUM CHLORIDE CRYS ER 20 MEQ PO TBCR
30.0000 meq | EXTENDED_RELEASE_TABLET | ORAL | Status: AC
  Administered 2022-03-16 (×2): 30 meq via ORAL
  Filled 2022-03-16 (×2): qty 1

## 2022-03-16 NOTE — Progress Notes (Signed)
Progress Note  Patient Name: Amber Cross Date of Encounter: 03/16/2022  [facility] Cardiologist: Godfrey Pick Tobb, DO   Subjective   I/Os incomplete.  Renal function improving (3.6 > 3.4 > 3.2).  BP 159/76 this morning.  Reports dyspnea improving.  Inpatient Medications    Scheduled Meds:  amLODipine  10 mg Oral Daily   aspirin EC  81 mg Oral Daily   furosemide  80 mg Intravenous BID   heparin  5,000 Units Subcutaneous Q8H   hydrALAZINE  50 mg Oral Q8H   insulin aspart  0-5 Units Subcutaneous QHS   insulin aspart  0-9 Units Subcutaneous TID WC   isosorbide mononitrate  30 mg Oral Daily   rosuvastatin  40 mg Oral Daily   Continuous Infusions:  PRN Meds: acetaminophen, hydrALAZINE, prochlorperazine   Vital Signs    Vitals:   03/15/22 1411 03/15/22 1521 03/15/22 2301 03/16/22 0701  BP: (!) 151/82 (!) 160/77 (!) 152/66 (!) 159/76  Pulse: 86  91 89  Resp: 16  18 18   Temp: 98.2 F (36.8 C)  99 F (37.2 C) 98.3 F (36.8 C)  TempSrc: Oral  Oral Oral  SpO2: 97%  91% 90%  Weight:    64.2 kg  Height:        Intake/Output Summary (Last 24 hours) at 03/16/2022 0750 Last data filed at 03/16/2022 0644 Gross per 24 hour  Intake 840 ml  Output 550 ml  Net 290 ml       03/16/2022    7:01 AM 03/15/2022    7:08 AM 03/13/2022    4:22 PM  Last 3 Weights  Weight (lbs) 141 lb 9.6 oz 139 lb 5.3 oz 141 lb  Weight (kg) 64.229 kg 63.2 kg 63.957 kg      Telemetry    Normal sinus rhythm- Personally Reviewed  ECG    No new ECG- Personally Reviewed  Physical Exam   GEN: No acute distress.   Neck: + JVD Cardiac: RRR, no murmurs, rubs, or gallops.  Respiratory: Diminished breath sounds GI: Soft, nontender, non-distended  MS: 1+ BLE edema Neuro:  Nonfocal  Psych: Normal affect   Labs    High Sensitivity Troponin:   Recent Labs  Lab 03/13/22 1713 03/13/22 2219  TROPONINIHS 19* 20*      Chemistry Recent Labs  Lab 03/13/22 2219 03/14/22 0111 03/14/22 0532  03/15/22 0558 03/16/22 0549  NA 141  --  142 142 139  K 2.7*  --  2.9* 3.2* 3.1*  CL 112*  --  112* 113* 110  CO2 18*  --  22 21* 22  GLUCOSE 221*  --  153* 139* 109*  BUN 37*  --  37* 34* 34*  CREATININE 3.59*  --  3.64* 3.39* 3.20*  CALCIUM 7.6*  --  8.2* 8.0* 7.8*  MG  --  1.7  --   --   --   PROT 5.5*  --   --   --   --   ALBUMIN 2.1*  --   --  2.2*  --   AST 14*  --   --   --   --   ALT 15  --   --   --   --   ALKPHOS 142*  --   --   --   --   BILITOT 0.3  --   --   --   --   GFRNONAA 14*  --  14* 15* 16*  ANIONGAP 11  --  8 8 7      Lipids No results for input(s): "CHOL", "TRIG", "HDL", "LABVLDL", "LDLCALC", "CHOLHDL" in the last 168 hours.  Hematology Recent Labs  Lab 03/13/22 1713 03/15/22 0558  WBC 8.1 7.4  RBC 2.99* 2.99*  HGB 8.5* 8.4*  HCT 26.4* 26.2*  MCV 88.3 87.6  MCH 28.4 28.1  MCHC 32.2 32.1  RDW 15.9* 15.6*  PLT 342 340    Thyroid No results for input(s): "TSH", "FREET4" in the last 168 hours.  BNP Recent Labs  Lab 03/13/22 1713  BNP 438.8*     DDimer No results for input(s): "DDIMER" in the last 168 hours.   Radiology    VAS [place] UPPER EXTREMITY VENOUS DUPLEX  Result Date: 03/15/2022 UPPER VENOUS STUDY  Patient Name:  Amber Cross  Date of Exam:   03/15/2022 Medical Rec #: [id number]        Accession #:    [id number] Date of Birth: 10/30/1960        Patient Gender: F Patient Age:   61 years Exam Location:  [facility] Procedure:      VAS [place] UPPER EXTREMITY VENOUS DUPLEX Referring Phys: DAWOOD ELGERGAWY --------------------------------------------------------------------------------  Indications: Edema Risk Factors: None identified. Comparison Study: No prior studies. Performing Technologist: Oliver Hum RVT  Examination Guidelines: A complete evaluation includes B-mode imaging, spectral Doppler, color Doppler, and power Doppler as needed of all accessible portions of each vessel. Bilateral testing is considered an integral part of a  complete examination. Limited examinations for reoccurring indications may be performed as noted.  Right Findings: +----------+------------+---------+-----------+----------+-------+ RIGHT     CompressiblePhasicitySpontaneousPropertiesSummary +----------+------------+---------+-----------+----------+-------+ IJV           Full       Yes       Yes                      +----------+------------+---------+-----------+----------+-------+ Subclavian    Full       Yes       Yes                      +----------+------------+---------+-----------+----------+-------+ Axillary      Full       Yes       Yes                      +----------+------------+---------+-----------+----------+-------+ Brachial      Full       Yes       Yes                      +----------+------------+---------+-----------+----------+-------+ Radial        Full                                          +----------+------------+---------+-----------+----------+-------+ Ulnar         Full                                          +----------+------------+---------+-----------+----------+-------+ Cephalic      Full                                          +----------+------------+---------+-----------+----------+-------+  Basilic       Full                                          +----------+------------+---------+-----------+----------+-------+  Left Findings: +----------+------------+---------+-----------+----------+-------+ LEFT      CompressiblePhasicitySpontaneousPropertiesSummary +----------+------------+---------+-----------+----------+-------+ IJV           Full       Yes       Yes                      +----------+------------+---------+-----------+----------+-------+ Subclavian    Full       Yes       Yes                      +----------+------------+---------+-----------+----------+-------+ Axillary      Full       Yes       Yes                       +----------+------------+---------+-----------+----------+-------+ Brachial      Full       Yes       Yes                      +----------+------------+---------+-----------+----------+-------+ Radial        Full                                          +----------+------------+---------+-----------+----------+-------+ Ulnar         Full                                          +----------+------------+---------+-----------+----------+-------+ Cephalic      Full                                          +----------+------------+---------+-----------+----------+-------+ Basilic       Full                                          +----------+------------+---------+-----------+----------+-------+  Summary:  Right: No evidence of deep vein thrombosis in the upper extremity. No evidence of superficial vein thrombosis in the upper extremity.  Left: No evidence of deep vein thrombosis in the upper extremity. No evidence of superficial vein thrombosis in the upper extremity.  *See table(s) above for measurements and observations.    Preliminary    NM Pulmonary Perfusion  Result Date: 03/14/2022 CLINICAL DATA:  Concern for thrombus in the main pulmonary artery on echocardiography. EXAM: NUCLEAR MEDICINE PERFUSION LUNG SCAN TECHNIQUE: Perfusion images were obtained in multiple projections after intravenous injection of radiopharmaceutical. RADIOPHARMACEUTICALS:  4.4 mCi Tc-50m MAA IV COMPARISON:  Chest radiograph 03/13/2022 FINDINGS: Normal perfusion to both lungs. No large wedge-shaped or peripheral perfusion abnormalities. IMPRESSION: Normal perfusion in both lungs.  No evidence for pulmonary embolism. Electronically Signed   By: Markus Daft M.D.   On: 03/14/2022 16:57   ECHOCARDIOGRAM LIMITED  Result Date: 03/14/2022  ECHOCARDIOGRAM LIMITED REPORT   Patient Name:   [facility] Date of Exam: 03/14/2022 Medical Rec #:  [id number]       Height:       67.5 in Accession #:     [id number]      Weight:       141.0 lb Date of Birth:  1960/11/16       BSA:          1.753 m Patient Age:    33 years        BP:           186/82 mmHg Patient Gender: F               HR:           84 bpm. Exam Location:  Inpatient Procedure: Limited Echo Indications:    J62.83 Acute systolic (congestive) heart failure  History:        Patient has prior history of Echocardiogram examinations, most                 recent 11/08/2021. CHF; Risk Factors:Hypertension, Diabetes and                 Dyslipidemia.  Sonographer:    Raquel Sarna Senior RDCS Referring Phys: [id number] Darreld Mclean  Sonographer Comments: Exam performed sitting upright for comfort. IMPRESSIONS  1. LV walls have a speckled appearance. Left ventricular ejection fraction, by estimation, is 40 to 45%. Left ventricular ejection fraction by 2D MOD biplane is 44.5 %. The left ventricle has mildly decreased function. The left ventricle demonstrates global hypokinesis. There is severe left ventricular hypertrophy.  2. No definitive tamponade physiology, although the RV appears underfilled. a small pericardial effusion is present. The pericardial effusion is circumferential.  3. The aortic valve is tricuspid. Aortic valve regurgitation is not visualized.  4. Suggestion of possible thrombus in the main pulmonary artery at the PA bifurcation, which appears dilated (image 4). mildly dilated pulmonary artery.  5. The inferior vena cava is normal in size with <50% respiratory variability, suggesting right atrial pressure of 8 mmHg. Comparison(s): Changes from prior study are noted. 11/08/2021: LVEF 55-60%, severe LVH, speckled myocardium. Conclusion(s)/Recommendation(s): Cannot exclude possible thrombus at the PA bifurcation - consider further imaging with V/Q scan. Critical findings reported to Dr. Gardiner Rhyme and acknowledged at 2:43 pm. FINDINGS  Left Ventricle: LV walls have a speckled appearance. Left ventricular ejection fraction, by estimation, is 40 to 45%. Left  ventricular ejection fraction by 2D MOD biplane is 44.5 %. The left ventricle has mildly decreased function. The left ventricle demonstrates global hypokinesis. There is severe left ventricular hypertrophy. Pericardium: No definitive tamponade physiology, although the RV appears underfilled. A small pericardial effusion is present. The pericardial effusion is circumferential. There is diastolic collapse of the right ventricular free wall and diastolic collapse of the right atrial wall. Aortic Valve: The aortic valve is tricuspid. Aortic valve regurgitation is not visualized. Pulmonary Artery: Suggestion of possible thrombus in the main pulmonary artery at the PA bifurcation, which appears dilated (image 4). The pulmonary artery is mildly dilated. Venous: The inferior vena cava is normal in size with less than 50% respiratory variability, suggesting right atrial pressure of 8 mmHg.  LV Volumes (MOD)               Biplane EF (MOD) LV vol d, MOD    90.1 ml       LV Biplane EF:   Left A2C:  ventricular LV vol d, MOD    103.0 ml                       ejection A4C:                                            fraction by LV vol s, MOD    48.4 ml                        2D MOD A2C:                                            biplane is LV vol s, MOD    56.2 ml                        44.5 %. A4C: LV SV MOD A2C:   41.7 ml LV SV MOD A4C:   103.0 ml LV SV MOD BP:    43.0 ml Lyman Bishop MD Electronically signed by Lyman Bishop MD Signature Date/Time: 03/14/2022/2:45:58 PM    Final     Cardiac Studies     Patient Profile     61 y.o. female with a history of chronic combined heart failure, CKD stage IV, CVA, small cell lung cancer, tobacco use, hypertension who we are consulted for evaluation of heart failure   Assessment & Plan    Anasarca Nephrotic syndrome Acute on Chronic Combined CHF  Patient has a history of chronic combined CHF with a EF as low as 25% in 04/2021 but  has since normalized.  Last echo in/2023 showed LVEF of 55-60% with severe concentric LVH and grade 1 diastolic dysfunction, normal RV, moderately dilated left atrium. There has been concern for amyloidosis in the past given bright speckled appearance of LV endocardium and other Echo findings. However, PYP scan in 04/2021 was equivocal for transthyretin amyloidosis and cardiac MRI in 08/2021 at [facility] showed no evidence of infiltrative disease.  Cardiomyopathy has been felt to be non-ischemic in the past given prior stress test in 12/2020 showed no evidence of ischemia or prior infarct.  Patient now presents with worsening shortness of breath and lower extremity edema and was found to be significantly volume overloaded on presentation.  BNP elevated at 438.  Chest x-ray shows cardiomegaly and small bilateral pleural effusions.  She was given 1 dose of IV Lasix 40 mg in the ED with no documented urinary output.  Creatinine 3.59 and potassium 2.7 on admission. - Echo this admission shows EF 40 to 45% - Continue diuresis per nephrology - Will hold off on resuming beta-blocker for now in the setting of decompensated CHF  - No ACEi/ARB/ARNI or MRA due to renal function. - No SGLT2 inhibitor right now due to renal function  - Continue hydralazine 50 mg 3 times daily, added Imdur 30 mg daily - Continue to monitor daily weights, strict I's and O's, and renal function. Dietary compliance is an issue for her, needs to limit sodium intake to 2000mg  a day.   ?Thrombus on echo There was concern for possible thrombus in main pulmonary artery on echo, though not well visualized.  VQ scan shows no evidence of PE  Chest Pain Patient has constant chest pain that she describes as a squeezing sensation. She has had this for months and it is unchanged. She has this 24/7. Not worse with exertion. EKG shows no acute ischemic changes.  High-sensitivity troponin 19 >> 20.   - Symptoms very atypical. Do not think this is cardiac  in nature. No ischemic work-up necessary at this time.    Hypertensive Urgency BP as high as 207/90 on admission.  Home regimen includes Amlodipine 10 mg daily, Coreg 25 mg daily, Hydralazine 50 mg three times daily.  Most recent BP improved with systolic BP in the 643P -Can continue amlodipine 10mg  daily and Hydralazine 50mg  three time daily.  Added Imdur 30 mg daily - Will hold off on restarting Coreg at this time given decompensated CHF.  Plan to restart prior to discharge   Hyperlipidemia - Continue home Crestor 40mg  daily.   Acute on CKD Stage IV Creatinine 3.59 on admission and is 3.2 today. Creatinine during recent admission last month peaked at 3.66 but then improved. Last creatinine on 02/20/2022 was 2.8. Renal artery ultrasound last month showed no evidence of renal artery stenosis. - Continue to avoid nephrotoxic agents. -Continue diuresis per nephrology   Hypokalemia Potassium 2.7 on arrival. Patient reports chronic diarrhea which may be the cause. - Continue supplementation per primary team.   Prolonged QTc QTc 528 ms on initial EKG, improved to 493 on EKG 8/18.  Suspect driven by hypokalemia   Otherwise, per primary team: - Small cell lung cancer - Poorly controlled type 2 diabetes mellitus - Metabolic acidosis - CVA - Chronic anemia  For questions or updates, please contact [facility] Please consult [email or web address] for contact info under        Signed, Donato Heinz, MD  03/16/2022, 7:50 AM

## 2022-03-16 NOTE — Progress Notes (Signed)
[facility] Progress Note  Subjective: seen in room, no c/o's, no SOB, minimal leg edema  Vitals:   03/15/22 1411 03/15/22 1521 03/15/22 2301 03/16/22 0701  BP: (!) 151/82 (!) 160/77 (!) 152/66 (!) 159/76  Pulse: 86  91 89  Resp: '16  18 18  ' Temp: 98.2 F (36.8 C)  99 F (37.2 C) 98.3 F (36.8 C)  TempSrc: Oral  Oral Oral  SpO2: 97%  91% 90%  Weight:    64.2 kg  Height:        Exam: Gen:NAD CVS:RRR Resp: dec'd BS at bases, normal WOB Abd: soft, nt/nd Ext: no pitting pretib/ hip edema, trace- 1+ ankle edema bilat Neuro: awake, alert       Date   Creat  eGFR   2013- 2020   2022   0.95- 1.44    March 2023  1.73  34 ml/min, ckd 3b   April 2023  1.67- 2.42 22- 35 ml/min, ckd 3b- 29 Nov 2021  2.00     July 2023   2.57- 3.66 14- 21 ml/min, ckd 4   8/16   3.59  14 ml/min   8/17   3.39   03/15/22  3.20  16 ml/min     UA 8/17 - >300 protein, 21-50 wbc, 0-5 rbc, no epis, many bact    UNa 85, UCr 66    Renal US - 13.4/ 12.2 cm kidneys w/o hydro, echo wnl    CXR - small bilat pleural effusions, no edema   Assessment/ Plan: AKI on CKD 4 w/ nephrotic syndrome - b/l creat is 2.6- 3.2 from July 2023. Has had significant progression over the last 1-2 yrs. Creat 3.59 on admission and is down a bit w/ diuresis. For kidney biopsy on Monday. Could be diabetic kidney disease vs other.  Vol excess - getting IV lasix 80 bid. Her edema is minimal today in the legs, lungs clear. On room air. CXR w/ small effusions bilat, no edema. Will cont IV lasix at this dose another 1-2 days.  HTN'sive urgency - getting po hydralazine, norvasc and imdur. BP's okay now, they were high on admission (200's SBP).  DM2 - on insulin, poorly controlled. Last A1C 9.7 in July.  H/o CVA      Amber Cross 03/16/2022, 2:09 PM   Recent Labs  Lab 03/13/22 1713 03/13/22 2219 03/14/22 0532 03/15/22 0558 03/16/22 0549  HGB 8.5*  --   --  8.4*  --   ALBUMIN  --  2.1*  --  2.2*  --   CALCIUM   --  7.6*   < > 8.0* 7.8*  PHOS  --   --   --  4.7*  --   CREATININE  --  3.59*   < > 3.39* 3.20*  K  --  2.7*   < > 3.2* 3.1*   < > = values in this interval not displayed.   Recent Labs  Lab 03/16/22 0549  IRON 50  TIBC 211*  FERRITIN 206   Inpatient medications:  amLODipine  10 mg Oral Daily   aspirin EC  81 mg Oral Daily   furosemide  80 mg Intravenous BID   heparin  5,000 Units Subcutaneous Q8H   hydrALAZINE  50 mg Oral Q8H   insulin aspart  0-5 Units Subcutaneous QHS   insulin aspart  0-9 Units Subcutaneous TID WC   isosorbide mononitrate  30 mg Oral Daily   rosuvastatin  40 mg Oral Daily  acetaminophen, hydrALAZINE, prochlorperazine

## 2022-03-16 NOTE — Progress Notes (Signed)
PROGRESS NOTE    Amber Cross  HUD:[id number] DOB: Jun 03, 1961 DOA: 03/13/2022 PCP: [name], DO   Brief Narrative:     Amber Cross is a 61 y.o. 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  Admitted a month ago for CPPD arthritis of left knee and AKI.  Had a follow-up visit with PCP on 8/3 and referred to nephrology due to concern for nephrotic range proteinuria and volume overload.   Patient presents to the ED with complaints of shortness of breath, chest pain, cough, and bilateral lower extremity edema.  In the ED, hypertensive with systolic above 858.  SPO2 86-88% on room air and placed on 2 L supplemental oxygen.  Labs showing no leukocytosis.  Hemoglobin 8.5, stable.  Potassium 2.7, magnesium 1.7, bicarb 18, BUN 37, creatinine 3.5, glucose 221, calcium 7.6, total protein 5.5, albumin 2.1.  Alkaline phosphatase 142, no elevation of remainder of LFTs.  BNP 438.  High-sensitivity troponin 19 > 20.  SARS-CoV-2 PCR negative.  Chest x-ray showing cardiomegaly and small bilateral pleural effusions. Medications administered include IV Lasix 40 mg, DuoNeb treatment, nitroglycerin ointment, and oral potassium. -She was admitted for further management.  Assessment & Plan:   Principal Problem:   Volume overload Active Problems:   Type 2 diabetes mellitus with chronic kidney disease, with long-term current use of insulin (HCC)   Chest pain   Acute heart failure (HCC)   Chronic systolic CHF (congestive heart failure) (HCC)   AKI (acute kidney injury) ([place])   Acute respiratory failure with hypoxia (HCC)   Hypertensive urgency   Hypokalemia   QT prolongation   Metabolic acidosis   Acute on chronic combined systolic and diastolic CHF (congestive heart failure) (HCC)  Anasarca AKI on CKD stage IIIb with nephrotic syndrome: chronic combined CHF with normalization of  EF: Patient follows nephrology as outpatient and plan was to do renal biopsy by end of the month but now the plan is to likely do it inpatient once patient stabilizes a bit.  Nephrology and cardiology on board, cardiology deferred to nephrology for diuresis.  She is on IV Lasix but does not have decent diuresis. -Her 2D echo and drop in her EF 40 to 45%, with questionable thrombus in pulmonary artery, for which VQ scan was obtained which ruled out PE.  DVT also ruled out.  Creatinine is improving 3.59 > 3.64 > 3.4 > 3.2renal ultrasound unremarkable.   Acute hypoxic respiratory failure/pleural effusion. Likely due to pleural effusions.  Weaned down to room air now.    Chest pain: Per cardiology, patient has chronic chest pain and this is [place] schedule, no further work-up is recommended.   Hypertensive urgency: Presented with systolic as high as 850Y in the ED, likely due to volume overload.  Blood pressure improving but still elevated.  Cardiology adjusting medications.  They are holding her Coreg and plan to resume at discharge.  Patient only on hydralazine 50 mg 3 times daily and amlodipine 10 mg p.o. daily and cardiology has added Imdur 30 mg p.o. daily.  Blood pressure improving.  Hypokalemia QT prolongation: Low again, 3.1, will replace.   Metabolic acidosis Likely secondary to CKD, mild and stable.  Management deferred to nephrology.   Poorly controlled insulin-dependent type 2 diabetes A1c 9.7 on 01/28/2022.  Takes Novolin 70/30 15 units in the morning and 10 units at night.  Currently off of that and she is  only on SSI and blood sugars are labile but fairly controlled.   History of CVA: Continue statin and aspirin.  DVT prophylaxis: heparin injection 5,000 Units Start: 03/14/22 0600   Code Status: Full Code  Family Communication:  None present at bedside.  Plan of care discussed with patient in length and he/she verbalized understanding and agreed with it.  Status is:  Inpatient Remains inpatient appropriate because: Needs more diuresis.   Estimated body mass index is 21.85 kg/m as calculated from the following:   Height as of this encounter: 5' 7.5" (1.715 m).   Weight as of this encounter: 64.2 kg.    Nutritional Assessment: Body mass index is 21.85 kg/m.Marland Kitchen Seen by dietician.  I agree with the assessment and plan as outlined below: Nutrition Status:        . Skin Assessment: I have examined the patient's skin and I agree with the wound assessment as performed by the wound care RN as outlined below:    Consultants:  Nephrology Cardiology  Procedures:  None  Antimicrobials:  Anti-infectives (From admission, onward)    None         Subjective:  Patient seen and examined.  She has no complaints.  Objective: Vitals:   03/15/22 1411 03/15/22 1521 03/15/22 2301 03/16/22 0701  BP: (!) 151/82 (!) 160/77 (!) 152/66 (!) 159/76  Pulse: 86  91 89  Resp: 16  18 18   Temp: 98.2 F (36.8 C)  99 F (37.2 C) 98.3 F (36.8 C)  TempSrc: Oral  Oral Oral  SpO2: 97%  91% 90%  Weight:    64.2 kg  Height:        Intake/Output Summary (Last 24 hours) at 03/16/2022 1133 Last data filed at 03/16/2022 0644 Gross per 24 hour  Intake 600 ml  Output 550 ml  Net 50 ml    Filed Weights   03/13/22 1622 03/15/22 0708 03/16/22 0701  Weight: 64 kg 63.2 kg 64.2 kg    Examination:  General exam: Appears calm and comfortable  Respiratory system: Diminished breath sounds at the bases bilaterally.  Respiratory effort normal. Cardiovascular system: S1 & S2 heard, RRR. No JVD, murmurs, rubs, gallops or clicks. No pedal edema. Gastrointestinal system: Abdomen is nondistended, soft and nontender. No organomegaly or masses felt. Normal bowel sounds heard. Central nervous system: Alert and oriented. No focal neurological deficits. Extremities: Symmetric 5 x 5 power. Skin: No rashes, lesions or ulcers.  Psychiatry: Judgement and insight appear normal.  Mood & affect appropriate.   Data Reviewed: I have personally reviewed following labs and imaging studies  CBC: Recent Labs  Lab 03/13/22 1713 03/15/22 0558  WBC 8.1 7.4  NEUTROABS 6.5  --   HGB 8.5* 8.4*  HCT 26.4* 26.2*  MCV 88.3 87.6  PLT 342 226    Basic Metabolic Panel: Recent Labs  Lab 03/13/22 2219 03/14/22 0111 03/14/22 0532 03/15/22 0558 03/16/22 0549  NA 141  --  142 142 139  K 2.7*  --  2.9* 3.2* 3.1*  CL 112*  --  112* 113* 110  CO2 18*  --  22 21* 22  GLUCOSE 221*  --  153* 139* 109*  BUN 37*  --  37* 34* 34*  CREATININE 3.59*  --  3.64* 3.39* 3.20*  CALCIUM 7.6*  --  8.2* 8.0* 7.8*  MG  --  1.7  --   --   --   PHOS  --   --   --  4.7*  --  GFR: Estimated Creatinine Clearance: 18.5 mL/min (A) (by C-G formula based on SCr of 3.2 mg/dL (H)). Liver Function Tests: Recent Labs  Lab 03/13/22 2219 03/15/22 0558  AST 14*  --   ALT 15  --   ALKPHOS 142*  --   BILITOT 0.3  --   PROT 5.5*  --   ALBUMIN 2.1* 2.2*    No results for input(s): "LIPASE", "AMYLASE" in the last 168 hours. No results for input(s): "AMMONIA" in the last 168 hours. Coagulation Profile: No results for input(s): "INR", "PROTIME" in the last 168 hours. Cardiac Enzymes: No results for input(s): "CKTOTAL", "CKMB", "CKMBINDEX", "TROPONINI" in the last 168 hours. BNP (last 3 results) No results for input(s): "PROBNP" in the last 8760 hours. HbA1C: No results for input(s): "HGBA1C" in the last 72 hours. CBG: Recent Labs  Lab 03/15/22 0746 03/15/22 1149 03/15/22 1601 03/15/22 2303 03/16/22 0741  GLUCAP 137* 128* 103* 183* 117*    Lipid Profile: No results for input(s): "CHOL", "HDL", "LDLCALC", "TRIG", "CHOLHDL", "LDLDIRECT" in the last 72 hours. Thyroid Function Tests: No results for input(s): "TSH", "T4TOTAL", "FREET4", "T3FREE", "THYROIDAB" in the last 72 hours. Anemia Panel: Recent Labs    03/16/22 0549  FERRITIN 206  TIBC 211*  IRON 50   Sepsis Labs: No  results for input(s): "PROCALCITON", "LATICACIDVEN" in the last 168 hours.  Recent Results (from the past 240 hour(s))  SARS Coronavirus 2 by RT PCR (hospital order, performed in [facility] hospital lab) *cepheid single result test* Anterior Nasal Swab     Status: None   Collection Time: 03/14/22 12:51 AM   Specimen: Anterior Nasal Swab  Result Value Ref Range Status   SARS Coronavirus 2 by RT PCR NEGATIVE NEGATIVE Final    Comment: (NOTE) SARS-CoV-2 target nucleic acids are NOT DETECTED.  The SARS-CoV-2 RNA is generally detectable in upper and lower respiratory specimens during the acute phase of infection. The lowest concentration of SARS-CoV-2 viral copies this assay can detect is 250 copies / mL. A negative result does not preclude SARS-CoV-2 infection and should not be used as the sole basis for treatment or other patient management decisions.  A negative result may occur with improper specimen collection / handling, submission of specimen other than nasopharyngeal swab, presence of viral mutation(s) within the areas targeted by this assay, and inadequate number of viral copies (<250 copies / mL). A negative result must be combined with clinical observations, patient history, and epidemiological information.  Fact Sheet for Patients:   https://www.patel.info/  Fact Sheet for Healthcare Providers: https://hall.com/  This test is not yet approved or  cleared by the [place] FDA and has been authorized for detection and/or diagnosis of SARS-CoV-2 by FDA under an Emergency Use Authorization (EUA).  This EUA will remain in effect (meaning this test can be used) for the duration of the COVID-19 declaration under Section 564(b)(1) of the Act, 21 U.S.C. section 360bbb-3(b)(1), unless the authorization is terminated or revoked sooner.  Performed at [facility], [address], [place], [place] [address]       Radiology Studies: VAS [place] UPPER EXTREMITY VENOUS DUPLEX  Result Date: 03/15/2022 UPPER VENOUS STUDY  Patient Name:  DAMONICA [facility]  Date of Exam:   03/15/2022 Medical Rec #: [id number]        Accession #:    [id number] Date of Birth: 10/23/1960        Patient Gender: F Patient Age:   54 years Exam Location:  [facility]  Procedure:      VAS [place] UPPER EXTREMITY VENOUS DUPLEX Referring Phys: DAWOOD ELGERGAWY --------------------------------------------------------------------------------  Indications: Edema Risk Factors: None identified. Comparison Study: No prior studies. Performing Technologist: Oliver Hum RVT  Examination Guidelines: A complete evaluation includes B-mode imaging, spectral Doppler, color Doppler, and power Doppler as needed of all accessible portions of each vessel. Bilateral testing is considered an integral part of a complete examination. Limited examinations for reoccurring indications may be performed as noted.  Right Findings: +----------+------------+---------+-----------+----------+-------+ RIGHT     CompressiblePhasicitySpontaneousPropertiesSummary +----------+------------+---------+-----------+----------+-------+ IJV           Full       Yes       Yes                      +----------+------------+---------+-----------+----------+-------+ Subclavian    Full       Yes       Yes                      +----------+------------+---------+-----------+----------+-------+ Axillary      Full       Yes       Yes                      +----------+------------+---------+-----------+----------+-------+ Brachial      Full       Yes       Yes                      +----------+------------+---------+-----------+----------+-------+ Radial        Full                                          +----------+------------+---------+-----------+----------+-------+ Ulnar         Full                                           +----------+------------+---------+-----------+----------+-------+ Cephalic      Full                                          +----------+------------+---------+-----------+----------+-------+ Basilic       Full                                          +----------+------------+---------+-----------+----------+-------+  Left Findings: +----------+------------+---------+-----------+----------+-------+ LEFT      CompressiblePhasicitySpontaneousPropertiesSummary +----------+------------+---------+-----------+----------+-------+ IJV           Full       Yes       Yes                      +----------+------------+---------+-----------+----------+-------+ Subclavian    Full       Yes       Yes                      +----------+------------+---------+-----------+----------+-------+ Axillary      Full       Yes       Yes                      +----------+------------+---------+-----------+----------+-------+  Brachial      Full       Yes       Yes                      +----------+------------+---------+-----------+----------+-------+ Radial        Full                                          +----------+------------+---------+-----------+----------+-------+ Ulnar         Full                                          +----------+------------+---------+-----------+----------+-------+ Cephalic      Full                                          +----------+------------+---------+-----------+----------+-------+ Basilic       Full                                          +----------+------------+---------+-----------+----------+-------+  Summary:  Right: No evidence of deep vein thrombosis in the upper extremity. No evidence of superficial vein thrombosis in the upper extremity.  Left: No evidence of deep vein thrombosis in the upper extremity. No evidence of superficial vein thrombosis in the upper extremity.  *See table(s) above for measurements and  observations.    Preliminary    NM Pulmonary Perfusion  Result Date: 03/14/2022 CLINICAL DATA:  Concern for thrombus in the main pulmonary artery on echocardiography. EXAM: NUCLEAR MEDICINE PERFUSION LUNG SCAN TECHNIQUE: Perfusion images were obtained in multiple projections after intravenous injection of radiopharmaceutical. RADIOPHARMACEUTICALS:  4.4 mCi Tc-50m MAA IV COMPARISON:  Chest radiograph 03/13/2022 FINDINGS: Normal perfusion to both lungs. No large wedge-shaped or peripheral perfusion abnormalities. IMPRESSION: Normal perfusion in both lungs.  No evidence for pulmonary embolism. Electronically Signed   By: Markus Daft M.D.   On: 03/14/2022 16:57   ECHOCARDIOGRAM LIMITED  Result Date: 03/14/2022    ECHOCARDIOGRAM LIMITED REPORT   Patient Name:   [facility] Date of Exam: 03/14/2022 Medical Rec #:  [id number]       Height:       67.5 in Accession #:    [id number]      Weight:       141.0 lb Date of Birth:  [birth year]       BSA:          1.753 m Patient Age:    18 years        BP:           186/82 mmHg Patient Gender: F               HR:           84 bpm. Exam Location:  Inpatient Procedure: Limited Echo Indications:    F02.77 Acute systolic (congestive) heart failure  History:        Patient has prior history of Echocardiogram examinations, most                 recent 11/08/2021. CHF; Risk Factors:Hypertension, Diabetes and  Dyslipidemia.  Sonographer:    Raquel Sarna Senior RDCS Referring Phys: [id number] Darreld Mclean  Sonographer Comments: Exam performed sitting upright for comfort. IMPRESSIONS  1. LV walls have a speckled appearance. Left ventricular ejection fraction, by estimation, is 40 to 45%. Left ventricular ejection fraction by 2D MOD biplane is 44.5 %. The left ventricle has mildly decreased function. The left ventricle demonstrates global hypokinesis. There is severe left ventricular hypertrophy.  2. No definitive tamponade physiology, although the RV appears underfilled. a  small pericardial effusion is present. The pericardial effusion is circumferential.  3. The aortic valve is tricuspid. Aortic valve regurgitation is not visualized.  4. Suggestion of possible thrombus in the main pulmonary artery at the PA bifurcation, which appears dilated (image 4). mildly dilated pulmonary artery.  5. The inferior vena cava is normal in size with <50% respiratory variability, suggesting right atrial pressure of 8 mmHg. Comparison(s): Changes from prior study are noted. 11/08/2021: LVEF 55-60%, severe LVH, speckled myocardium. Conclusion(s)/Recommendation(s): Cannot exclude possible thrombus at the PA bifurcation - consider further imaging with V/Q scan. Critical findings reported to Dr. Gardiner Rhyme and acknowledged at 2:43 pm. FINDINGS  Left Ventricle: LV walls have a speckled appearance. Left ventricular ejection fraction, by estimation, is 40 to 45%. Left ventricular ejection fraction by 2D MOD biplane is 44.5 %. The left ventricle has mildly decreased function. The left ventricle demonstrates global hypokinesis. There is severe left ventricular hypertrophy. Pericardium: No definitive tamponade physiology, although the RV appears underfilled. A small pericardial effusion is present. The pericardial effusion is circumferential. There is diastolic collapse of the right ventricular free wall and diastolic collapse of the right atrial wall. Aortic Valve: The aortic valve is tricuspid. Aortic valve regurgitation is not visualized. Pulmonary Artery: Suggestion of possible thrombus in the main pulmonary artery at the PA bifurcation, which appears dilated (image 4). The pulmonary artery is mildly dilated. Venous: The inferior vena cava is normal in size with less than 50% respiratory variability, suggesting right atrial pressure of 8 mmHg.  LV Volumes (MOD)               Biplane EF (MOD) LV vol d, MOD    90.1 ml       LV Biplane EF:   Left A2C:                                            ventricular LV vol  d, MOD    103.0 ml                       ejection A4C:                                            fraction by LV vol s, MOD    48.4 ml                        2D MOD A2C:                                            biplane is LV vol s, MOD    56.2 ml  44.5 %. A4C: LV SV MOD A2C:   41.7 ml LV SV MOD A4C:   103.0 ml LV SV MOD BP:    43.0 ml Lyman Bishop MD Electronically signed by Lyman Bishop MD Signature Date/Time: 03/14/2022/2:45:58 PM    Final     Scheduled Meds:  amLODipine  10 mg Oral Daily   aspirin EC  81 mg Oral Daily   furosemide  80 mg Intravenous BID   heparin  5,000 Units Subcutaneous Q8H   hydrALAZINE  50 mg Oral Q8H   insulin aspart  0-5 Units Subcutaneous QHS   insulin aspart  0-9 Units Subcutaneous TID WC   isosorbide mononitrate  30 mg Oral Daily   potassium chloride  30 mEq Oral Q4H   rosuvastatin  40 mg Oral Daily   Continuous Infusions:   LOS: 2 days   Darliss Cheney, MD [facility]  03/16/2022, 11:33 AM   *Please note that this is a verbal dictation therefore any spelling or grammatical errors are due to the "[place]" system interpretation.  Please page via [place] and do not message via secure chat for urgent patient care matters. Secure chat can be used for non urgent patient care matters.  How to contact the [facility] Attending or Consulting provider [place] or covering provider during after hours [place], for this patient?  Check the care team in [facility] and look for a) attending/consulting [facility] provider listed and b) the [facility] team listed. Page or secure chat [phone]. Log into [email or web address] and use [place] universal password to access. If you do not have the password, please contact the hospital operator. Locate the [facility] provider you are looking for under [facility] and page to a number that you can be directly reached. If you still have difficulty reaching the provider, please page the [facility] (Director on Call) for the Hospitalists  listed on amion for assistance.

## 2022-03-17 ENCOUNTER — Inpatient Hospital Stay (HOSPITAL_COMMUNITY): Payer: Commercial Managed Care - HMO

## 2022-03-17 DIAGNOSIS — E8770 Fluid overload, unspecified: Secondary | ICD-10-CM | POA: Diagnosis not present

## 2022-03-17 DIAGNOSIS — I5043 Acute on chronic combined systolic (congestive) and diastolic (congestive) heart failure: Secondary | ICD-10-CM | POA: Diagnosis not present

## 2022-03-17 DIAGNOSIS — N179 Acute kidney failure, unspecified: Secondary | ICD-10-CM | POA: Diagnosis not present

## 2022-03-17 DIAGNOSIS — R778 Other specified abnormalities of plasma proteins: Secondary | ICD-10-CM

## 2022-03-17 DIAGNOSIS — I160 Hypertensive urgency: Secondary | ICD-10-CM | POA: Diagnosis not present

## 2022-03-17 LAB — BASIC METABOLIC PANEL
Anion gap: 4 — ABNORMAL LOW (ref 5–15)
BUN: 35 mg/dL — ABNORMAL HIGH (ref 6–20)
CO2: 22 mmol/L (ref 22–32)
Calcium: 8 mg/dL — ABNORMAL LOW (ref 8.9–10.3)
Chloride: 114 mmol/L — ABNORMAL HIGH (ref 98–111)
Creatinine, Ser: 3.26 mg/dL — ABNORMAL HIGH (ref 0.44–1.00)
GFR, Estimated: 16 mL/min — ABNORMAL LOW (ref >60)
Glucose, Bld: 116 mg/dL — ABNORMAL HIGH (ref 70–99)
Potassium: 3.4 mmol/L — ABNORMAL LOW (ref 3.5–5.1)
Sodium: 140 mmol/L (ref 135–145)

## 2022-03-17 LAB — GLUCOSE, CAPILLARY
Glucose-Capillary: 116 mg/dL — ABNORMAL HIGH (ref 70–99)
Glucose-Capillary: 173 mg/dL — ABNORMAL HIGH (ref 70–99)
Glucose-Capillary: 238 mg/dL — ABNORMAL HIGH (ref 70–99)
Glucose-Capillary: 174 mg/dL — ABNORMAL HIGH (ref 70–99)

## 2022-03-17 LAB — MAGNESIUM: Magnesium: 1.8 mg/dL (ref 1.7–2.4)

## 2022-03-17 MED ORDER — OXYCODONE HCL 5 MG PO TABS
5.0000 mg | ORAL_TABLET | Freq: Once | ORAL | Status: AC
  Administered 2022-03-17: 5 mg via ORAL
  Filled 2022-03-17: qty 1

## 2022-03-17 MED ORDER — POTASSIUM CHLORIDE CRYS ER 20 MEQ PO TBCR
40.0000 meq | EXTENDED_RELEASE_TABLET | Freq: Once | ORAL | Status: AC
  Administered 2022-03-17: 40 meq via ORAL
  Filled 2022-03-17: qty 2

## 2022-03-17 MED ORDER — HYDROCOD POLI-CHLORPHE POLI ER 10-8 MG/5ML PO SUER
5.0000 mL | Freq: Two times a day (BID) | ORAL | Status: DC | PRN
  Administered 2022-03-17: 5 mL via ORAL
  Filled 2022-03-17: qty 5

## 2022-03-17 MED ORDER — CARVEDILOL 6.25 MG PO TABS
6.2500 mg | ORAL_TABLET | Freq: Two times a day (BID) | ORAL | Status: DC
Start: 2022-03-17 — End: 2022-03-18
  Administered 2022-03-17 (×2): 6.25 mg via ORAL
  Filled 2022-03-17 (×2): qty 1

## 2022-03-17 NOTE — Progress Notes (Signed)
PROGRESS NOTE    Amber Cross  DPO:[id number] DOB: 09/23/1960 DOA: 03/13/2022 PCP: [name], DO   Brief Narrative:     Amber Cross is a 61 y.o. 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  Admitted a month ago for CPPD arthritis of left knee and AKI.  Had a follow-up visit with PCP on 8/3 and referred to nephrology due to concern for nephrotic range proteinuria and volume overload.   Patient presents to the ED with complaints of shortness of breath, chest pain, cough, and bilateral lower extremity edema.  In the ED, hypertensive with systolic above 431.  SPO2 86-88% on room air and placed on 2 L supplemental oxygen.  Labs showing no leukocytosis.  Hemoglobin 8.5, stable.  Potassium 2.7, magnesium 1.7, bicarb 18, BUN 37, creatinine 3.5, glucose 221, calcium 7.6, total protein 5.5, albumin 2.1.  Alkaline phosphatase 142, no elevation of remainder of LFTs.  BNP 438.  High-sensitivity troponin 19 > 20.  SARS-CoV-2 PCR negative.  Chest x-ray showing cardiomegaly and small bilateral pleural effusions. Medications administered include IV Lasix 40 mg, DuoNeb treatment, nitroglycerin ointment, and oral potassium. -She was admitted for further management.  Assessment & Plan:   Principal Problem:   Volume overload Active Problems:   Type 2 diabetes mellitus with chronic kidney disease, with long-term current use of insulin (HCC)   Chest pain   Acute heart failure (HCC)   Chronic systolic CHF (congestive heart failure) (HCC)   AKI (acute kidney injury) ([place])   Acute respiratory failure with hypoxia (HCC)   Hypertensive urgency   Hypokalemia   QT prolongation   Metabolic acidosis   Acute on chronic combined systolic and diastolic CHF (congestive heart failure) (HCC)   Elevated troponin I level  Anasarca AKI on CKD stage IIIb with nephrotic syndrome: chronic  combined CHF with normalization of EF: Patient follows nephrology as outpatient and plan was to do renal biopsy by end of the month but now the plan is to likely do it inpatient once patient stabilizes a bit.  Nephrology and cardiology on board, cardiology deferred to nephrology for diuresis.  She is on IV Lasix but does not have decent diuresis.  Edema has resolved.  She is a scheduled to have renal biopsy tomorrow. -Her 2D echo and drop in her EF 40 to 45%, with questionable thrombus in pulmonary artery, for which VQ scan was obtained which ruled out PE.  DVT also ruled out.  Creatinine is improving 3.59 > 3.64 > 3.4 > 3.2> 3.26 renal ultrasound unremarkable.   Acute hypoxic respiratory failure/pleural effusion. Likely due to pleural effusions.  Weaned down to room air now.    Chest pain: Per cardiology, patient has chronic chest pain and this is [place] schedule, no further work-up is recommended.   Hypertensive urgency: Presented with systolic as high as 540G in the ED, likely due to volume overload.  Cardiology managing.  They were holding her Coreg but due to elevated blood pressure, they have resumed it today.  Patient also will on hydralazine 50 mg 3 times daily and amlodipine 10 mg p.o. daily and cardiology has added Imdur 30 mg p.o. daily.   Hypokalemia QT prolongation: Slightly low potassium, will replace.   Metabolic acidosis Likely secondary to CKD, mild and stable.  Management deferred to nephrology.   Poorly controlled insulin-dependent type 2 diabetes A1c 9.7 on 01/28/2022.  Takes Novolin 70/30 15  units in the morning and 10 units at night.  Currently off of that and she is only on SSI and blood sugars are labile but fairly controlled.   History of CVA: Continue statin and aspirin.  DVT prophylaxis: heparin injection 5,000 Units Start: 03/14/22 0600   Code Status: Full Code  Family Communication:  None present at bedside.  Plan of care discussed with patient in length and he/she  verbalized understanding and agreed with it.  Status is: Inpatient Remains inpatient appropriate because: Needs more diuresis.   Estimated body mass index is 21.26 kg/m as calculated from the following:   Height as of this encounter: 5' 7.5" (1.715 m).   Weight as of this encounter: 62.5 kg.    Nutritional Assessment: Body mass index is 21.26 kg/m.Mar[place] Kitchen Seen by dietician.  I agree with the assessment and plan as outlined below: Nutrition Status:        . Skin Assessment: I have examined the patient's skin and I agree with the wound assessment as performed by the wound care RN as outlined below:    Consultants:  Nephrology Cardiology  Procedures:  None  Antimicrobials:  Anti-infectives (From admission, onward)    None         Subjective:  Seen and examined.  She has no complaints.  Objective: Vitals:   03/16/22 0701 03/16/22 1425 03/16/22 2100 03/17/22 0554  BP: (!) 159/76 (!) 149/76 (!) 157/74 (!) 185/81  Pulse: 89 88 88 91  Resp: 18 16 16 16   Temp: 98.3 F (36.8 C) 98.7 F (37.1 C) 98.2 F (36.8 C) 98.2 F (36.8 C)  TempSrc: Oral Oral Oral Oral  SpO2: 90% 93% 94% 94%  Weight: 64.2 kg   62.5 kg  Height:        Intake/Output Summary (Last 24 hours) at 03/17/2022 1131 Last data filed at 03/17/2022 1000 Gross per 24 hour  Intake 480 ml  Output 1200 ml  Net -720 ml    Filed Weights   03/15/22 0708 03/16/22 0701 03/17/22 0554  Weight: 63.2 kg 64.2 kg 62.5 kg    Examination:  General exam: Appears calm and comfortable  Respiratory system: Clear to auscultation. Respiratory effort normal. Cardiovascular system: S1 & S2 heard, RRR. No JVD, murmurs, rubs, gallops or clicks. No pedal edema. Gastrointestinal system: Abdomen is nondistended, soft and nontender. No organomegaly or masses felt. Normal bowel sounds heard. Central nervous system: Alert and oriented. No focal neurological deficits. Extremities: Symmetric 5 x 5 power. Skin: No rashes,  lesions or ulcers.  Psychiatry: Judgement and insight appear normal. Mood & affect appropriate.   Data Reviewed: I have personally reviewed following labs and imaging studies  CBC: Recent Labs  Lab 03/13/22 1713 03/15/22 0558  WBC 8.1 7.4  NEUTROABS 6.5  --   HGB 8.5* 8.4*  HCT 26.4* 26.2*  MCV 88.3 87.6  PLT 342 914    Basic Metabolic Panel: Recent Labs  Lab 03/13/22 2219 03/14/22 0111 03/14/22 0532 03/15/22 0558 03/16/22 0549 03/17/22 0547  NA 141  --  142 142 139 140  K 2.7*  --  2.9* 3.2* 3.1* 3.4*  CL 112*  --  112* 113* 110 114*  CO2 18*  --  22 21* 22 22  GLUCOSE 221*  --  153* 139* 109* 116*  BUN 37*  --  37* 34* 34* 35*  CREATININE 3.59*  --  3.64* 3.39* 3.20* 3.26*  CALCIUM 7.6*  --  8.2* 8.0* 7.8* 8.0*  MG  --  1.7  --   --   --  1.8  PHOS  --   --   --  4.7*  --   --     GFR: Estimated Creatinine Clearance: 18.1 mL/min (A) (by C-G formula based on SCr of 3.26 mg/dL (H)). Liver Function Tests: Recent Labs  Lab 03/13/22 2219 03/15/22 0558  AST 14*  --   ALT 15  --   ALKPHOS 142*  --   BILITOT 0.3  --   PROT 5.5*  --   ALBUMIN 2.1* 2.2*    No results for input(s): "LIPASE", "AMYLASE" in the last 168 hours. No results for input(s): "AMMONIA" in the last 168 hours. Coagulation Profile: No results for input(s): "INR", "PROTIME" in the last 168 hours. Cardiac Enzymes: No results for input(s): "CKTOTAL", "CKMB", "CKMBINDEX", "TROPONINI" in the last 168 hours. BNP (last 3 results) No results for input(s): "PROBNP" in the last 8760 hours. HbA1C: No results for input(s): "HGBA1C" in the last 72 hours. CBG: Recent Labs  Lab 03/16/22 0741 03/16/22 1134 03/16/22 1638 03/16/22 2111 03/17/22 0750  GLUCAP 117* 177* 167* 185* 116*    Lipid Profile: No results for input(s): "CHOL", "HDL", "LDLCALC", "TRIG", "CHOLHDL", "LDLDIRECT" in the last 72 hours. Thyroid Function Tests: No results for input(s): "TSH", "T4TOTAL", "FREET4", "T3FREE",  "THYROIDAB" in the last 72 hours. Anemia Panel: Recent Labs    03/16/22 0549  FERRITIN 206  TIBC 211*  IRON 50    Sepsis Labs: No results for input(s): "PROCALCITON", "LATICACIDVEN" in the last 168 hours.  Recent Results (from the past 240 hour(s))  SARS Coronavirus 2 by RT PCR (hospital order, performed in [facility] hospital lab) *cepheid single result test* Anterior Nasal Swab     Status: None   Collection Time: 03/14/22 12:51 AM   Specimen: Anterior Nasal Swab  Result Value Ref Range Status   SARS Coronavirus 2 by RT PCR NEGATIVE NEGATIVE Final    Comment: (NOTE) SARS-CoV-2 target nucleic acids are NOT DETECTED.  The SARS-CoV-2 RNA is generally detectable in upper and lower respiratory specimens during the acute phase of infection. The lowest concentration of SARS-CoV-2 viral copies this assay can detect is 250 copies / mL. A negative result does not preclude SARS-CoV-2 infection and should not be used as the sole basis for treatment or other patient management decisions.  A negative result may occur with improper specimen collection / handling, submission of specimen other than nasopharyngeal swab, presence of viral mutation(s) within the areas targeted by this assay, and inadequate number of viral copies (<250 copies / mL). A negative result must be combined with clinical observations, patient history, and epidemiological information.  Fact Sheet for Patients:   https://www.patel.info/  Fact Sheet for Healthcare Providers: https://hall.com/  This test is not yet approved or  cleared by the [place] FDA and has been authorized for detection and/or diagnosis of SARS-CoV-2 by FDA under an Emergency Use Authorization (EUA).  This EUA will remain in effect (meaning this test can be used) for the duration of the COVID-19 declaration under Section 564(b)(1) of the Act, 21 U.S.C. section 360bbb-3(b)(1), unless the  authorization is terminated or revoked sooner.  Performed at [facility], [place] [address], [place], [place] [address]      Radiology Studies: No results found.  Scheduled Meds:  amLODipine  10 mg Oral Daily   aspirin EC  81 mg Oral Daily   carvedilol  6.25 mg Oral BID WC   heparin  5,000  Units Subcutaneous Q8H   hydrALAZINE  50 mg Oral Q8H   insulin aspart  0-5 Units Subcutaneous QHS   insulin aspart  0-9 Units Subcutaneous TID WC   isosorbide mononitrate  30 mg Oral Daily   rosuvastatin  40 mg Oral Daily   Continuous Infusions:   LOS: 3 days   Darliss Cheney, MD [facility]  03/17/2022, 11:31 AM   *Please note that this is a verbal dictation therefore any spelling or grammatical errors are due to the "[place] [employer]" system interpretation.  Please page via [place] and do not message via secure chat for urgent patient care matters. Secure chat can be used for non urgent patient care matters.  How to contact the [facility] Attending or Consulting provider [place] or covering provider during after hours [place], for this patient?  Check the care team in [facility] and look for a) attending/consulting [facility] provider listed and b) the [facility] team listed. Page or secure chat [phone]. Log into [email or web address] and use [place]'s universal password to access. If you do not have the password, please contact the hospital operator. Locate the [facility] provider you are looking for under [facility] and page to a number that you can be directly reached. If you still have difficulty reaching the provider, please page the [facility] (Director on Call) for the Hospitalists listed on amion for assistance.

## 2022-03-17 NOTE — Progress Notes (Addendum)
[facility] Progress Note  Subjective: seen in room, no c/o's, no SOB, minimal leg edema. Has wet cough and chest pain when coughing. Cough x 2 wks.   Vitals:   03/16/22 0701 03/16/22 1425 03/16/22 2100 03/17/22 0554  BP: (!) 159/76 (!) 149/76 (!) 157/74 (!) 185/81  Pulse: 89 88 88 91  Resp: _0 Temp: 98.3 F (36.8 C) 98.7 F (37.1 C) 98.2 F (36.8 C) 98.2 F (36.8 C)  TempSrc: Oral Oral Oral Oral  SpO2: 90% 93% 94% 94%  Weight: 64.2 kg   62.5 kg  Height:        Exam: Gen:NAD CVS:RRR Resp: dec'd BS at bases, normal WOB Abd: soft, nt/nd Ext: no pitting pretib/ hip edema, trace- 1+ ankle edema bilat Neuro: awake, alert       Date   Creat  eGFR   2013- 2020   2022   0.95- 1.44    March 2023  1.73  34 ml/min, ckd 3b   April 2023  1.67- 2.42 22- 35 ml/min, ckd 3b- 29 Nov 2021  2.00     July 2023   2.57- 3.66 14- 21 ml/min, ckd 4   8/16   3.59  14 ml/min   8/17   3.39   03/15/22  3.20  16 ml/min     UA 8/17 - >300 protein, 21-50 wbc, 0-5 rbc, no epis, many bact    UNa 85, UCr 66    Renal US - 13.4/ 12.2 cm kidneys w/o hydro, echo wnl    CXR - small bilat pleural effusions, no edema   Assessment/ Plan: AKI on CKD 4 w/ nephrotic syndrome - b/l creat is 2.6- 3.2 from July 2023. Has had significant progression over the last year. Creat 3.59 on admission and has been stable / down slightly w/ diuresis. Vol overload resolved now, will reconsult IR for kidney biopsy.  Vol excess - has been getting IV lasix 80 bid and edema has resolved and pt is on room air. Will dc lasix.  HTN'sive urgency - getting po hydralazine, norvasc and imdur. BP's controlled, they were high on admission (200's SBP).  DM2 - on insulin, poorly controlled. Last A1C 9.7 in July.  H/o CVA  Kelly Splinter 03/17/2022, 7:38 AM   Recent Labs  Lab 03/13/22 1713 03/13/22 2219 03/14/22 0532 03/15/22 0558 03/16/22 0549 03/17/22 0547  HGB 8.5*  --   --  8.4*  --   --   ALBUMIN  --   2.1*  --  2.2*  --   --   CALCIUM  --  7.6*   < > 8.0* 7.8* 8.0*  PHOS  --   --   --  4.7*  --   --   CREATININE  --  3.59*   < > 3.39* 3.20* 3.26*  K  --  2.7*   < > 3.2* 3.1* 3.4*   < > = values in this interval not displayed.    Recent Labs  Lab 03/16/22 0549  IRON 50  TIBC 211*  FERRITIN 206    Inpatient medications:  amLODipine  10 mg Oral Daily   aspirin EC  81 mg Oral Daily   furosemide  80 mg Intravenous BID   heparin  5,000 Units Subcutaneous Q8H   hydrALAZINE  50 mg Oral Q8H   insulin aspart  0-5 Units Subcutaneous QHS   insulin aspart  0-9 Units Subcutaneous TID WC   isosorbide mononitrate  30 mg Oral Daily   rosuvastatin  40 mg Oral Daily    acetaminophen, hydrALAZINE, prochlorperazine

## 2022-03-17 NOTE — Progress Notes (Signed)
Progress Note  Patient Name: Amber Cross Date of Encounter: 03/17/2022  [facility] Cardiologist: Godfrey Pick Tobb, DO   Subjective   I/Os incomplete.  Renal function stable (3.6 > 3.4 > 3.2 >3.3).  BP elevated this morning.  Reports dyspnea improving but continues to feel short of breath  Inpatient Medications    Scheduled Meds:  amLODipine  10 mg Oral Daily   aspirin EC  81 mg Oral Daily   furosemide  80 mg Intravenous BID   heparin  5,000 Units Subcutaneous Q8H   hydrALAZINE  50 mg Oral Q8H   insulin aspart  0-5 Units Subcutaneous QHS   insulin aspart  0-9 Units Subcutaneous TID WC   isosorbide mononitrate  30 mg Oral Daily   potassium chloride  40 mEq Oral Once   rosuvastatin  40 mg Oral Daily   Continuous Infusions:  PRN Meds: acetaminophen, hydrALAZINE, prochlorperazine   Vital Signs    Vitals:   03/16/22 0701 03/16/22 1425 03/16/22 2100 03/17/22 0554  BP: (!) 159/76 (!) 149/76 (!) 157/74 (!) 185/81  Pulse: 89 88 88 91  Resp: 18 16 16 16   Temp: 98.3 F (36.8 C) 98.7 F (37.1 C) 98.2 F (36.8 C) 98.2 F (36.8 C)  TempSrc: Oral Oral Oral Oral  SpO2: 90% 93% 94% 94%  Weight: 64.2 kg   62.5 kg  Height:        Intake/Output Summary (Last 24 hours) at 03/17/2022 0836 Last data filed at 03/17/2022 1191 Gross per 24 hour  Intake 600 ml  Output 1200 ml  Net -600 ml       03/17/2022    5:54 AM 03/16/2022    7:01 AM 03/15/2022    7:08 AM  Last 3 Weights  Weight (lbs) 137 lb 12.8 oz 141 lb 9.6 oz 139 lb 5.3 oz  Weight (kg) 62.506 kg 64.229 kg 63.2 kg      Telemetry    Normal sinus rhythm- Personally Reviewed  ECG    No new ECG- Personally Reviewed  Physical Exam   GEN: No acute distress.   Neck: + JVD Cardiac: RRR, no murmurs, rubs, or gallops.  Respiratory: Diminished breath sounds GI: Soft, nontender, non-distended  MS: trace BLE edema Neuro:  Nonfocal  Psych: Normal affect   Labs    High Sensitivity Troponin:   Recent Labs  Lab  03/13/22 1713 03/13/22 2219  TROPONINIHS 19* 20*      Chemistry Recent Labs  Lab 03/13/22 2219 03/14/22 0111 03/14/22 0532 03/15/22 0558 03/16/22 0549 03/17/22 0547  NA 141  --    < > 142 139 140  K 2.7*  --    < > 3.2* 3.1* 3.4*  CL 112*  --    < > 113* 110 114*  CO2 18*  --    < > 21* 22 22  GLUCOSE 221*  --    < > 139* 109* 116*  BUN 37*  --    < > 34* 34* 35*  CREATININE 3.59*  --    < > 3.39* 3.20* 3.26*  CALCIUM 7.6*  --    < > 8.0* 7.8* 8.0*  MG  --  1.7  --   --   --  1.8  PROT 5.5*  --   --   --   --   --   ALBUMIN 2.1*  --   --  2.2*  --   --   AST 14*  --   --   --   --   --  ALT 15  --   --   --   --   --   ALKPHOS 142*  --   --   --   --   --   BILITOT 0.3  --   --   --   --   --   GFRNONAA 14*  --    < > 15* 16* 16*  ANIONGAP 11  --    < > 8 7 4*   < > = values in this interval not displayed.     Lipids No results for input(s): "CHOL", "TRIG", "HDL", "LABVLDL", "LDLCALC", "CHOLHDL" in the last 168 hours.  Hematology Recent Labs  Lab 03/13/22 1713 03/15/22 0558  WBC 8.1 7.4  RBC 2.99* 2.99*  HGB 8.5* 8.4*  HCT 26.4* 26.2*  MCV 88.3 87.6  MCH 28.4 28.1  MCHC 32.2 32.1  RDW 15.9* 15.6*  PLT 342 340    Thyroid No results for input(s): "TSH", "FREET4" in the last 168 hours.  BNP Recent Labs  Lab 03/13/22 1713  BNP 438.8*     DDimer No results for input(s): "DDIMER" in the last 168 hours.   Radiology    VAS [place] UPPER EXTREMITY VENOUS DUPLEX  Result Date: 03/15/2022 UPPER VENOUS STUDY  Patient Name:  [facility]  Date of Exam:   03/15/2022 Medical Rec #: [id number]        Accession #:    [id number] Date of Birth: 1961/03/13        Patient Gender: F Patient Age:   61 years Exam Location:  [facility] Procedure:      VAS [place] UPPER EXTREMITY VENOUS DUPLEX Referring Phys: DAWOOD ELGERGAWY --------------------------------------------------------------------------------  Indications: Edema Risk Factors: None identified. Comparison Study: No  prior studies. Performing Technologist: Oliver Hum RVT  Examination Guidelines: A complete evaluation includes B-mode imaging, spectral Doppler, color Doppler, and power Doppler as needed of all accessible portions of each vessel. Bilateral testing is considered an integral part of a complete examination. Limited examinations for reoccurring indications may be performed as noted.  Right Findings: +----------+------------+---------+-----------+----------+-------+ RIGHT     CompressiblePhasicitySpontaneousPropertiesSummary +----------+------------+---------+-----------+----------+-------+ IJV           Full       Yes       Yes                      +----------+------------+---------+-----------+----------+-------+ Subclavian    Full       Yes       Yes                      +----------+------------+---------+-----------+----------+-------+ Axillary      Full       Yes       Yes                      +----------+------------+---------+-----------+----------+-------+ Brachial      Full       Yes       Yes                      +----------+------------+---------+-----------+----------+-------+ Radial        Full                                          +----------+------------+---------+-----------+----------+-------+ Ulnar         Full                                          +----------+------------+---------+-----------+----------+-------+  Cephalic      Full                                          +----------+------------+---------+-----------+----------+-------+ Basilic       Full                                          +----------+------------+---------+-----------+----------+-------+  Left Findings: +----------+------------+---------+-----------+----------+-------+ LEFT      CompressiblePhasicitySpontaneousPropertiesSummary +----------+------------+---------+-----------+----------+-------+ IJV           Full       Yes       Yes                       +----------+------------+---------+-----------+----------+-------+ Subclavian    Full       Yes       Yes                      +----------+------------+---------+-----------+----------+-------+ Axillary      Full       Yes       Yes                      +----------+------------+---------+-----------+----------+-------+ Brachial      Full       Yes       Yes                      +----------+------------+---------+-----------+----------+-------+ Radial        Full                                          +----------+------------+---------+-----------+----------+-------+ Ulnar         Full                                          +----------+------------+---------+-----------+----------+-------+ Cephalic      Full                                          +----------+------------+---------+-----------+----------+-------+ Basilic       Full                                          +----------+------------+---------+-----------+----------+-------+  Summary:  Right: No evidence of deep vein thrombosis in the upper extremity. No evidence of superficial vein thrombosis in the upper extremity.  Left: No evidence of deep vein thrombosis in the upper extremity. No evidence of superficial vein thrombosis in the upper extremity.  *See table(s) above for measurements and observations.    Preliminary     Cardiac Studies     Patient Profile     61 y.o. female with a history of chronic combined heart failure, CKD stage IV, CVA, small cell lung cancer, tobacco use, hypertension who we are consulted for evaluation of heart failure   Assessment & Plan  Anasarca Nephrotic syndrome Acute on Chronic Combined CHF  Patient has a history of chronic combined CHF with a EF as low as 25% in 04/2021 but subsequently normalized.  Last echo in 10/2021 showed LVEF of 55-60% with severe concentric LVH and grade 1 diastolic dysfunction, normal RV, moderately dilated left  atrium. There has been concern for amyloidosis. However, PYP scan in 04/2021 was equivocal for transthyretin amyloidosis and cardiac MRI in 08/2021 at [facility] showed no evidence of infiltrative disease.  Cardiomyopathy has been felt to be non-ischemic in the past given prior stress test in 12/2020 showed no evidence of ischemia or prior infarct.  Patient now presents with worsening shortness of breath and lower extremity edema and was found to be significantly volume overloaded on presentation, though also with low albumin and nephrotic syndrome.  BNP elevated at 438.  Chest x-ray shows cardiomegaly and small bilateral pleural effusions.   Creatinine 3.59 and potassium 2.7 on admission. - Echo this admission shows EF 40 to 45% - Continue diuresis per nephrology - Held off on beta blocker initially in setting of decompensated CHF, with improvement in volume status and BP significantly elevated, will add coreg - No ACEi/ARB/ARNI or MRA due to renal function. - No SGLT2 inhibitor right now due to renal function  - Continue hydralazine 50 mg 3 times daily, added Imdur 30 mg daily - Continue to monitor daily weights, strict I's and O's, and renal function. Dietary compliance is an issue for her, needs to limit sodium intake to 2000mg  a day.   ?Thrombus on echo There was concern for possible thrombus in main pulmonary artery on echo, though not well visualized.  VQ scan shows no evidence of PE  Chest Pain Patient has constant chest pain that she describes as a squeezing sensation. She has had this for months and it is unchanged. Not worse with exertion. EKG shows no acute ischemic changes.  High-sensitivity troponin 19 >> 20.   - Symptoms very atypical. Do not think this is cardiac in nature. No ischemic work-up necessary at this time.    Hypertensive Urgency BP as high as 207/90 on admission.  Home regimen includes Amlodipine 10 mg daily, Coreg 25 mg daily, Hydralazine 50 mg three times daily.  Most recent  BP improved with systolic BP in the 630Z -Can continue amlodipine 10mg  daily and Hydralazine 50mg  three time daily.  Continue Imdur 30 mg daily - Add coreg 6.25 mg BID, can titrate as needed   Hyperlipidemia - Continue home Crestor 40mg  daily.   Acute on CKD Stage IV Creatinine 3.59 on admission and is 3.26 today. Creatinine during recent admission last month peaked at 3.66 but then improved. Last creatinine on 02/20/2022 was 2.8. Renal artery ultrasound last month showed no evidence of renal artery stenosis. - Continue to avoid nephrotoxic agents. -Continue diuresis per nephrology   Hypokalemia Potassium 2.7 on arrival. Patient reports chronic diarrhea which may be the cause. - Continue supplementation per primary team.   Prolonged QTc QTc 528 ms on initial EKG, improved to 493 on EKG 8/18.  Suspect driven by hypokalemia   Otherwise, per primary team: - Small cell lung cancer - Poorly controlled type 2 diabetes mellitus - Metabolic acidosis - CVA - Chronic anemia  For questions or updates, please contact [facility] Please consult [email or web address] for contact info under        Signed, Donato Heinz, MD  03/17/2022, 8:36 AM

## 2022-03-18 DIAGNOSIS — I5022 Chronic systolic (congestive) heart failure: Secondary | ICD-10-CM

## 2022-03-18 DIAGNOSIS — I160 Hypertensive urgency: Secondary | ICD-10-CM | POA: Diagnosis not present

## 2022-03-18 DIAGNOSIS — I5043 Acute on chronic combined systolic (congestive) and diastolic (congestive) heart failure: Secondary | ICD-10-CM | POA: Diagnosis not present

## 2022-03-18 DIAGNOSIS — E8770 Fluid overload, unspecified: Secondary | ICD-10-CM | POA: Diagnosis not present

## 2022-03-18 DIAGNOSIS — N179 Acute kidney failure, unspecified: Secondary | ICD-10-CM | POA: Diagnosis not present

## 2022-03-18 LAB — BASIC METABOLIC PANEL
Anion gap: 5 (ref 5–15)
BUN: 34 mg/dL — ABNORMAL HIGH (ref 6–20)
CO2: 21 mmol/L — ABNORMAL LOW (ref 22–32)
Calcium: 7.9 mg/dL — ABNORMAL LOW (ref 8.9–10.3)
Chloride: 114 mmol/L — ABNORMAL HIGH (ref 98–111)
Creatinine, Ser: 3.43 mg/dL — ABNORMAL HIGH (ref 0.44–1.00)
GFR, Estimated: 15 mL/min — ABNORMAL LOW (ref >60)
Glucose, Bld: 125 mg/dL — ABNORMAL HIGH (ref 70–99)
Potassium: 3.8 mmol/L (ref 3.5–5.1)
Sodium: 140 mmol/L (ref 135–145)

## 2022-03-18 LAB — CBC WITH DIFFERENTIAL/PLATELET
Abs Immature Granulocytes: 0.04 10*3/uL (ref 0.00–0.07)
Basophils Absolute: 0 10*3/uL (ref 0.0–0.1)
Basophils Relative: 0 %
Eosinophils Absolute: 0.1 10*3/uL (ref 0.0–0.5)
Eosinophils Relative: 1 %
HCT: 24.6 % — ABNORMAL LOW (ref 36.0–46.0)
Hemoglobin: 7.8 g/dL — ABNORMAL LOW (ref 12.0–15.0)
Immature Granulocytes: 1 %
Lymphocytes Relative: 15 %
Lymphs Abs: 1.2 10*3/uL (ref 0.7–4.0)
MCH: 28.1 pg (ref 26.0–34.0)
MCHC: 31.7 g/dL (ref 30.0–36.0)
MCV: 88.5 fL (ref 80.0–100.0)
Monocytes Absolute: 0.7 10*3/uL (ref 0.1–1.0)
Monocytes Relative: 9 %
Neutro Abs: 5.7 10*3/uL (ref 1.7–7.7)
Neutrophils Relative %: 74 %
Platelets: 287 10*3/uL (ref 150–400)
RBC: 2.78 MIL/uL — ABNORMAL LOW (ref 3.87–5.11)
RDW: 15.3 % (ref 11.5–15.5)
WBC: 7.8 10*3/uL (ref 4.0–10.5)
nRBC: 0 % (ref 0.0–0.2)

## 2022-03-18 LAB — PROTIME-INR
INR: 0.9 (ref 0.8–1.2)
Prothrombin Time: 12.4 seconds (ref 11.4–15.2)

## 2022-03-18 LAB — GLUCOSE, CAPILLARY
Glucose-Capillary: 126 mg/dL — ABNORMAL HIGH (ref 70–99)
Glucose-Capillary: 107 mg/dL — ABNORMAL HIGH (ref 70–99)

## 2022-03-18 LAB — PROCALCITONIN: Procalcitonin: 0.18 ng/mL

## 2022-03-18 MED ORDER — CARVEDILOL 25 MG PO TABS: 25.0000 mg | ORAL_TABLET | Freq: Two times a day (BID) | ORAL | 0 refills | Status: DC

## 2022-03-18 MED ORDER — CARVEDILOL 12.5 MG PO TABS
12.5000 mg | ORAL_TABLET | Freq: Two times a day (BID) | ORAL | Status: DC
  Administered 2022-03-18: 12.5 mg via ORAL
  Filled 2022-03-18: qty 1

## 2022-03-18 MED ORDER — ISOSORBIDE MONONITRATE ER 30 MG PO TB24: 30.0000 mg | ORAL_TABLET | Freq: Every day | ORAL | 0 refills | Status: DC

## 2022-03-18 NOTE — Progress Notes (Signed)
PROGRESS NOTE    Amber Cross  ZLD:[id number] DOB: 1961/05/15 DOA: 03/13/2022 PCP: [name], DO   Brief Narrative:     Amber Cross is a 61 y.o. 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  Admitted a month ago for CPPD arthritis of left knee and AKI.  Had a follow-up visit with PCP on 8/3 and referred to nephrology due to concern for nephrotic range proteinuria and volume overload.   Patient presents to the ED with complaints of shortness of breath, chest pain, cough, and bilateral lower extremity edema.  In the ED, hypertensive with systolic above 903.  SPO2 86-88% on room air and placed on 2 L supplemental oxygen.  Labs showing no leukocytosis.  Hemoglobin 8.5, stable.  Potassium 2.7, magnesium 1.7, bicarb 18, BUN 37, creatinine 3.5, glucose 221, calcium 7.6, total protein 5.5, albumin 2.1.  Alkaline phosphatase 142, no elevation of remainder of LFTs.  BNP 438.  High-sensitivity troponin 19 > 20.  SARS-CoV-2 PCR negative.  Chest x-ray showing cardiomegaly and small bilateral pleural effusions. Medications administered include IV Lasix 40 mg, DuoNeb treatment, nitroglycerin ointment, and oral potassium. -She was admitted for further management.  Assessment & Plan:   Principal Problem:   Volume overload Active Problems:   Type 2 diabetes mellitus with chronic kidney disease, with long-term current use of insulin (HCC)   Chest pain   Acute heart failure (HCC)   Chronic systolic CHF (congestive heart failure) (HCC)   AKI (acute kidney injury) ([place])   Acute respiratory failure with hypoxia (HCC)   Hypertensive urgency   Hypokalemia   QT prolongation   Metabolic acidosis   Acute on chronic combined systolic and diastolic CHF (congestive heart failure) (HCC)   Elevated troponin I level  Anasarca AKI on CKD stage IIIb with nephrotic syndrome: chronic  combined CHF with normalization of EF: Patient follows nephrology as outpatient and plan was to do renal biopsy by end of the month but now the plan is to likely do it inpatient once patient stabilizes a bit.  Nephrology and cardiology on board, cardiology deferred to nephrology for diuresis.  She received IV Lasix for several days which was stopped by nephrology on 03/17/2022.  Edema has resolved.  She is a scheduled to have renal biopsy tomorrow. -Her 2D echo and drop in her EF 40 to 45%, with questionable thrombus in pulmonary artery, for which VQ scan was obtained which ruled out PE.  DVT also ruled out.  Creatinine is fluctuating 3.59 > 3.64 > 3.4 > 3.2> 3.26> 3.43 renal ultrasound unremarkable.  She is scheduled for renal biopsy today.   Acute hypoxic respiratory failure/pleural effusion. Likely due to pleural effusions.  Weaned down to room air now.   She planes of wet cough but clear sputum.  Chest x-ray repeated on 03/17/2022 ruled out pneumonia.  She tells me that she always has cough and smokes half pack a day and she has 2 sons who also smoke along with her and they all smoke in the same house.  Her cough is likely smoker's cough.  We will check procalcitonin.  Chest pain: Per cardiology, patient has chronic chest pain and this is [place] schedule, no further work-up is recommended.   Hypertensive urgency: Presented with systolic as high as 009Q in the ED, likely due to volume overload.  Cardiology managing.  They were holding her Coreg but due to elevated blood  pressure, they eventually resumed at.  Patient also is on hydralazine 50 mg 3 times daily and amlodipine 10 mg p.o. daily and cardiology has added Imdur 30 mg p.o. daily and resumed Coreg at 6.25 mg twice daily, blood pressure still elevated, cardiology increased Coreg to 12.5 mg p.o. twice daily.  Hypokalemia: Resolved.   Metabolic acidosis Likely secondary to CKD, mild and stable.  Management deferred to nephrology.   Poorly  controlled insulin-dependent type 2 diabetes A1c 9.7 on 01/28/2022.  Takes Novolin 70/30 15 units in the morning and 10 units at night.  Currently off of that and she is only on SSI and blood sugars are labile but fairly controlled.   History of CVA: Continue statin and aspirin.  DVT prophylaxis: heparin injection 5,000 Units Start: 03/14/22 0600   Code Status: Full Code  Family Communication:  None present at bedside.  Plan of care discussed with patient in length and he/she verbalized understanding and agreed with it.  Status is: Inpatient Remains inpatient appropriate because: Needs renal biopsy   Estimated body mass index is 21.36 kg/m as calculated from the following:   Height as of this encounter: 5' 7.5" (1.715 m).   Weight as of this encounter: 62.8 kg.    Nutritional Assessment: Body mass index is 21.36 kg/m.Marland Kitchen Seen by dietician.  I agree with the assessment and plan as outlined below: Nutrition Status:        . Skin Assessment: I have examined the patient's skin and I agree with the wound assessment as performed by the wound care RN as outlined below:    Consultants:  Nephrology Cardiology  Procedures:  None  Antimicrobials:  Anti-infectives (From admission, onward)    None         Subjective:  Patient seen and examined.  Complains of cough, no other complaint.  Objective: Vitals:   03/17/22 1216 03/17/22 2021 03/18/22 0500 03/18/22 0500  BP: (!) 145/66 (!) 150/78  (!) 181/83  Pulse: 85 88  87  Resp: 16 18  (!) 22  Temp: 97.8 F (36.6 C) 98.9 F (37.2 C)  98.2 F (36.8 C)  TempSrc: Oral Oral  Oral  SpO2: 96% 96%  98%  Weight:   62.8 kg   Height:        Intake/Output Summary (Last 24 hours) at 03/18/2022 1001 Last data filed at 03/18/2022 0026 Gross per 24 hour  Intake 360 ml  Output 925 ml  Net -565 ml    Filed Weights   03/16/22 0701 03/17/22 0554 03/18/22 0500  Weight: 64.2 kg 62.5 kg 62.8 kg    Examination:  General exam:  Appears calm and comfortable  Respiratory system: Clear to auscultation. Respiratory effort normal. Cardiovascular system: S1 & S2 heard, RRR. No JVD, murmurs, rubs, gallops or clicks. No pedal edema. Gastrointestinal system: Abdomen is nondistended, soft and nontender. No organomegaly or masses felt. Normal bowel sounds heard. Central nervous system: Alert and oriented. No focal neurological deficits. Extremities: Symmetric 5 x 5 power. Skin: No rashes, lesions or ulcers.  Psychiatry: Judgement and insight appear normal. Mood & affect appropriate.   Data Reviewed: I have personally reviewed following labs and imaging studies  CBC: Recent Labs  Lab 03/13/22 1713 03/15/22 0558 03/18/22 0545  WBC 8.1 7.4 7.8  NEUTROABS 6.5  --  5.7  HGB 8.5* 8.4* 7.8*  HCT 26.4* 26.2* 24.6*  MCV 88.3 87.6 88.5  PLT 342 340 026    Basic Metabolic Panel: Recent Labs  Lab 03/14/22  0111 03/14/22 0532 03/15/22 0558 03/16/22 0549 03/17/22 0547 03/18/22 0545  NA  --  142 142 139 140 140  K  --  2.9* 3.2* 3.1* 3.4* 3.8  CL  --  112* 113* 110 114* 114*  CO2  --  22 21* 22 22 21*  GLUCOSE  --  153* 139* 109* 116* 125*  BUN  --  37* 34* 34* 35* 34*  CREATININE  --  3.64* 3.39* 3.20* 3.26* 3.43*  CALCIUM  --  8.2* 8.0* 7.8* 8.0* 7.9*  MG 1.7  --   --   --  1.8  --   PHOS  --   --  4.7*  --   --   --     GFR: Estimated Creatinine Clearance: 17.3 mL/min (A) (by C-G formula based on SCr of 3.43 mg/dL (H)). Liver Function Tests: Recent Labs  Lab 03/13/22 2219 03/15/22 0558  AST 14*  --   ALT 15  --   ALKPHOS 142*  --   BILITOT 0.3  --   PROT 5.5*  --   ALBUMIN 2.1* 2.2*    No results for input(s): "LIPASE", "AMYLASE" in the last 168 hours. No results for input(s): "AMMONIA" in the last 168 hours. Coagulation Profile: Recent Labs  Lab 03/18/22 0545  INR 0.9   Cardiac Enzymes: No results for input(s): "CKTOTAL", "CKMB", "CKMBINDEX", "TROPONINI" in the last 168 hours. BNP (last 3  results) No results for input(s): "PROBNP" in the last 8760 hours. HbA1C: No results for input(s): "HGBA1C" in the last 72 hours. CBG: Recent Labs  Lab 03/17/22 0750 03/17/22 1214 03/17/22 1613 03/17/22 2119 03/18/22 0807  GLUCAP 116* 173* 238* 174* 126*    Lipid Profile: No results for input(s): "CHOL", "HDL", "LDLCALC", "TRIG", "CHOLHDL", "LDLDIRECT" in the last 72 hours. Thyroid Function Tests: No results for input(s): "TSH", "T4TOTAL", "FREET4", "T3FREE", "THYROIDAB" in the last 72 hours. Anemia Panel: Recent Labs    03/16/22 0549  FERRITIN 206  TIBC 211*  IRON 50    Sepsis Labs: No results for input(s): "PROCALCITON", "LATICACIDVEN" in the last 168 hours.  Recent Results (from the past 240 hour(s))  SARS Coronavirus 2 by RT PCR (hospital order, performed in [facility] [place] hospital lab) *cepheid single result test* Anterior Nasal Swab     Status: None   Collection Time: 03/14/22 12:51 AM   Specimen: Anterior Nasal Swab  Result Value Ref Range Status   SARS Coronavirus 2 by RT PCR NEGATIVE NEGATIVE Final    Comment: (NOTE) SARS-CoV-2 target nucleic acids are NOT DETECTED.  The SARS-CoV-2 RNA is generally detectable in upper and lower respiratory specimens during the acute phase of infection. The lowest concentration of SARS-CoV-2 viral copies this assay can detect is 250 copies / mL. A negative result does not preclude SARS-CoV-2 infection and should not be used as the sole basis for treatment or other patient management decisions.  A negative result may occur with improper specimen collection / handling, submission of specimen other than nasopharyngeal swab, presence of viral mutation(s) within the areas targeted by this assay, and inadequate number of viral copies (<250 copies / mL). A negative result must be combined with clinical observations, patient history, and epidemiological information.  Fact Sheet for Patients:    https://www.patel.info/  Fact Sheet for Healthcare Providers: https://hall.com/  This test is not yet approved or  cleared by the [place] FDA and has been authorized for detection and/or diagnosis of SARS-CoV-2 by FDA under an Emergency Use Authorization (  EUA).  This EUA will remain in effect (meaning this test can be used) for the duration of the COVID-19 declaration under Section 564(b)(1) of the Act, 21 U.S.C. section 360bbb-3(b)(1), unless the authorization is terminated or revoked sooner.  Performed at [facility], [place] [address], [place], [place] [address]      Radiology Studies: DG CHEST PORT 1 VIEW  Result Date: 03/17/2022 CLINICAL DATA:  Shortness of breath, cough, congestion EXAM: PORTABLE CHEST 1 VIEW COMPARISON:  03/13/2022 FINDINGS: Gross cardiomegaly. Small left pleural effusion no significant right pleural effusion. Mild diffuse interstitial pulmonary opacity. The visualized skeletal structures are unremarkable. IMPRESSION: Gross cardiomegaly with small left pleural effusion and mild diffuse interstitial pulmonary opacity, likely edema. No focal airspace opacity. Electronically Signed   By: Delanna Ahmadi M.D.   On: 03/17/2022 15:59    Scheduled Meds:  amLODipine  10 mg Oral Daily   aspirin EC  81 mg Oral Daily   carvedilol  12.5 mg Oral BID WC   heparin  5,000 Units Subcutaneous Q8H   hydrALAZINE  50 mg Oral Q8H   insulin aspart  0-5 Units Subcutaneous QHS   insulin aspart  0-9 Units Subcutaneous TID WC   isosorbide mononitrate  30 mg Oral Daily   rosuvastatin  40 mg Oral Daily   Continuous Infusions:   LOS: 4 days   Darliss Cheney, MD [facility]  03/18/2022, 10:01 AM   *Please note that this is a verbal dictation therefore any spelling or grammatical errors are due to the "[place]" system interpretation.  Please page via [place] and do not message via secure chat for urgent  patient care matters. Secure chat can be used for non urgent patient care matters.  How to contact the [facility] Attending or Consulting provider [place] or covering provider during after hours [place], for this patient?  Check the care team in [facility] and look for a) attending/consulting [facility] provider listed and b) the [place] team listed. Page or secure chat [phone]. Log into [email or web address] and use [place]'s universal password to access. If you do not have the password, please contact the hospital operator. Locate the [facility] provider you are looking for under [facility] and page to a number that you can be directly reached. If you still have difficulty reaching the provider, please page the [facility] (Director on Call) for the Hospitalists listed on amion for assistance.

## 2022-03-18 NOTE — Discharge Summary (Signed)
PatientPhysician Discharge Summary  Amber Cross QJF:[id number] DOB: April 12, 1961 DOA: 03/13/2022  PCP: [name], DO  Admit date: 03/13/2022 Discharge date: 03/18/2022 30 Day Unplanned Readmission Risk Score    Flowsheet Row ED to Hosp-Admission (Current) from 03/13/2022 in [facility]  30 Day Unplanned Readmission Risk Score (%) 44.71 Filed at 03/18/2022 1200       This score is the patient's risk of an unplanned readmission within 30 days of being discharged (0 -100%). The score is based on dignosis, age, lab data, medications, orders, and past utilization.   Low:  0-14.9   Medium: 15-21.9   High: 22-29.9   Extreme: 30 and above          Admitted From: Home Disposition: Home  Recommendations for Outpatient Follow-up:  Follow up with PCP in 1-2 weeks Please obtain BMP/CBC in one week Follow with nephrology within 1 to 2 weeks Follow-up with IR for renal biopsy Please follow up with your PCP on the following pending results: Unresulted Labs (From admission, onward)    None         Home Health: None Equipment/Devices: None  Discharge Condition: Stable  CODE STATUS: Full code Diet recommendation: Renal/low-sodium  Subjective: Seen and examined.  No complaints other than cough.  Brief/Interim Summary:  Amber Cross is a 61 y.o. 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  Admitted a month ago for CPPD arthritis of left knee and AKI.  Had a follow-up visit with PCP on 8/3 and referred to nephrology due to concern for nephrotic range proteinuria and volume overload.   Patient presented with complaints of shortness of breath, chest pain, cough, and bilateral lower extremity edema.  In the ED, hypertensive with systolic above 893.  SPO2 86-88% on room air and placed on 2 L supplemental oxygen.  Labs showing no leukocytosis.  Hemoglobin  8.5, stable.  Potassium 2.7, magnesium 1.7, bicarb 18, BUN 37, creatinine 3.5, glucose 221, calcium 7.6, total protein 5.5, albumin 2.1.  Alkaline phosphatase 142, no elevation of remainder of LFTs.  BNP 438.  High-sensitivity troponin 19 > 20.  SARS-CoV-2 PCR negative.  Chest x-ray showing cardiomegaly and small bilateral pleural effusions. Medications administered include IV Lasix 40 mg, DuoNeb treatment, nitroglycerin ointment, and oral potassium. -She was admitted for further management.   Anasarca AKI on CKD stage IIIb with nephrotic syndrome: chronic combined CHF with normalization of EF: Patient follows nephrology as outpatient and plan was to do renal biopsy by end of the month but now the plan is to likely do it inpatient once patient stabilizes a bit.  Nephrology and cardiology on board, cardiology deferred to nephrology for diuresis.  She received IV Lasix for several days which was stopped by nephrology on 03/17/2022.  Edema has resolved.  -Her 2D echo and drop in her EF 40 to 45%, with questionable thrombus in pulmonary artery, for which VQ scan was obtained which ruled out PE.  DVT also ruled out.  Creatinine is fluctuating 3.59 > 3.64 > 3.4 > 3.2> 3.26> 3.43 renal ultrasound unremarkable.  She was scheduled for renal biopsy today on 03/18/2022 however IR was only consulted today by nephrology and per IR, she has been continued on baby aspirin and that baby aspirin needs to be held for 3 to 5 days before biopsy can be done and for that reason, IR did not do her renal biopsy.  Patient then expressed her desire to go home.  I discussed with Dr. Nicholes Stairs of the neurology who cleared the patient for discharge.  He informed me that patient already had renal biopsy scheduled on August 30 as outpatient and that they will continue with that plan.  He recommended discharging patient on previous dose of torsemide 100 mg p.o. daily.   Acute hypoxic respiratory failure/pleural effusion. Likely due to  pleural effusions.  Weaned Cross to room air now.   She planes of wet cough but clear sputum.  Chest x-ray repeated on 03/17/2022 ruled out pneumonia.  She tells me that she always has cough and smokes half pack a day and she has 2 sons who also smoke along with her and they all smoke in the same house.  Her cough is likely smoker's cough.  We will check procalcitonin.   Chest pain: Per cardiology, patient has chronic chest pain and this is [place] schedule, no further work-up is recommended.   Hypertensive urgency: Presented with systolic as high as 601U in the ED, likely due to volume overload.  Cardiology managing.  They were holding her Coreg but due to elevated blood pressure, they eventually resumed at.  Patient also is on hydralazine 50 mg 3 times daily and amlodipine 10 mg p.o. daily and cardiology has added Imdur 30 mg p.o. daily and resumed Coreg at 6.25 mg twice daily, blood pressure still elevated, cardiology increased Coreg to 12.5 mg p.o. twice daily.  Since she is going home, and the fact that she was on 50 mg of Coreg prior to admission and her blood pressure still is elevated, I will increase this to 25 mg p.o. twice daily.  I will also prescribe Imdur that cardiology has recommended.   Hypokalemia: Resolved.   Metabolic acidosis: Resolved.  Cleared by nephrology for going home.   Poorly controlled insulin-dependent type 2 diabetes A1c 9.7 on 01/28/2022.  Takes Novolin 70/30 15 units in the morning and 10 units at night.  Home medications.  History of CVA: Continue statin and aspirin.  IR will call her 3 to 5 days before to stop the aspirin.  Discharge plan was discussed with patient and/or family member and they verbalized understanding and agreed with it.  Discharge Diagnoses:  Principal Problem:   Volume overload Active Problems:   Type 2 diabetes mellitus with chronic kidney disease, with long-term current use of insulin (HCC)   Chest pain   Acute heart failure (HCC)   Chronic  systolic CHF (congestive heart failure) (HCC)   AKI (acute kidney injury) ([facility])   Acute respiratory failure with hypoxia (HCC)   Hypertensive urgency   Hypokalemia   QT prolongation   Metabolic acidosis   Acute on chronic combined systolic and diastolic CHF (congestive heart failure) (HCC)   Elevated troponin I level    Discharge Instructions   Allergies as of 03/18/2022       Reactions   Penicillins Itching, Other (See Comments)   redness   Strawberry (diagnostic) Itching, Swelling   Tomato Itching, Swelling        Medication List     STOP taking these medications    doxycycline 100 MG tablet Commonly known as: VIBRA-TABS   DULoxetine 60 MG capsule Commonly known as: CYMBALTA   sucralfate 1 g tablet Commonly known as: CARAFATE       TAKE these medications    acetaminophen 500 MG tablet Commonly known as: TYLENOL Take 1,000 mg by mouth every 6 (six) hours as  needed for mild pain.   albuterol 108 (90 Base) MCG/ACT inhaler Commonly known as: VENTOLIN HFA Inhale 2 puffs into the lungs every 6 (six) hours as needed for wheezing or shortness of breath.   amLODipine 10 MG tablet Commonly known as: NORVASC Take 1 tablet (10 mg total) by mouth daily.   aspirin EC 81 MG tablet Take 1 tablet (81 mg total) by mouth daily. Swallow whole.   carvedilol 25 MG tablet Commonly known as: COREG Take 1 tablet (25 mg total) by mouth 2 (two) times daily with a meal. What changed: how much to take   gabapentin 300 MG capsule Commonly known as: Neurontin Take 1 capsule (300 mg total) by mouth daily. What changed: when to take this   hydrALAZINE 50 MG tablet Commonly known as: APRESOLINE Take 1 tablet (50 mg total) by mouth every 8 (eight) hours.   hydrOXYzine 25 MG tablet Commonly known as: ATARAX Take 1 tablet (25 mg total) by mouth at bedtime.   isosorbide mononitrate 30 MG 24 hr tablet Commonly known as: IMDUR Take 1 tablet (30 mg total) by mouth  daily. Start taking on: March 19, 2022   nitroGLYCERIN 0.4 MG SL tablet Commonly known as: NITROSTAT Place 0.4 mg under the tongue every 5 (five) minutes as needed for chest pain.   NovoLIN 70/30 Kwikpen (70-30) 100 UNIT/ML KwikPen Generic drug: insulin isophane & regular human KwikPen Inject 15 Units into the skin in the morning AND 10 Units every evening.   oxyCODONE 5 MG immediate release tablet Commonly known as: Oxy IR/ROXICODONE Take 5 mg by mouth every 8 (eight) hours as needed for moderate pain, severe pain or breakthrough pain.   potassium chloride SA 20 MEQ tablet Commonly known as: KLOR-CON M Take 20 mEq by mouth daily.   rosuvastatin 40 MG tablet Commonly known as: CRESTOR Take 1 tablet (40 mg total) by mouth daily.   torsemide 100 MG tablet Commonly known as: DEMADEX Take 100 mg by mouth every morning.   Unifine Pentips 32G X 4 MM Misc Generic drug: Insulin Pen Needle Use in the morning and at bedtime   Vitamin D (Ergocalciferol) 1.25 MG (50000 UNIT) Caps capsule Commonly known as: DRISDOL Take 1 capsule (50,000 Units total) by mouth every 7 (seven) days.        Follow-up Information     [name], DO Follow up in 1 week(s).   Specialty: Internal Medicine Contact information: [place] [address] [phone]         Berniece Salines, DO .   Specialty: Cardiology Contact information: [address] [place] [address] [place] [address] [phone]                Allergies  Allergen Reactions   Penicillins Itching and Other (See Comments)    redness   Strawberry (Diagnostic) Itching and Swelling   Tomato Itching and Swelling    Consultations: Nephrology, cardiology and IR   Procedures/Studies: DG CHEST PORT 1 VIEW  Result Date: 03/17/2022 CLINICAL DATA:  Shortness of breath, cough, congestion EXAM: PORTABLE CHEST 1 VIEW COMPARISON:  03/13/2022 FINDINGS: Gross cardiomegaly. Small left pleural effusion no significant  right pleural effusion. Mild diffuse interstitial pulmonary opacity. The visualized skeletal structures are unremarkable. IMPRESSION: Gross cardiomegaly with small left pleural effusion and mild diffuse interstitial pulmonary opacity, likely edema. No focal airspace opacity. Electronically Signed   By: Delanna Ahmadi M.D.   On: 03/17/2022 15:59   VAS [place] UPPER EXTREMITY VENOUS DUPLEX  Result Date: 03/17/2022 UPPER VENOUS STUDY  Patient Name:  [facility]  Date of Exam:   03/15/2022 Medical Rec #: [id number]        Accession #:    [id number] Date of Birth: 06/21/1961        Patient Gender: F Patient Age:   61 years Exam Location:  [facility] Procedure:      VAS [place] UPPER EXTREMITY VENOUS DUPLEX Referring Phys: DAWOOD ELGERGAWY --------------------------------------------------------------------------------  Indications: Edema Risk Factors: None identified. Comparison Study: No prior studies. Performing Technologist: Oliver Hum RVT  Examination Guidelines: A complete evaluation includes B-mode imaging, spectral Doppler, color Doppler, and power Doppler as needed of all accessible portions of each vessel. Bilateral testing is considered an integral part of a complete examination. Limited examinations for reoccurring indications may be performed as noted.  Right Findings: +----------+------------+---------+-----------+----------+-------+ RIGHT     CompressiblePhasicitySpontaneousPropertiesSummary +----------+------------+---------+-----------+----------+-------+ IJV           Full       Yes       Yes                      +----------+------------+---------+-----------+----------+-------+ Subclavian    Full       Yes       Yes                      +----------+------------+---------+-----------+----------+-------+ Axillary      Full       Yes       Yes                      +----------+------------+---------+-----------+----------+-------+ Brachial      Full       Yes        Yes                      +----------+------------+---------+-----------+----------+-------+ Radial        Full                                          +----------+------------+---------+-----------+----------+-------+ Ulnar         Full                                          +----------+------------+---------+-----------+----------+-------+ Cephalic      Full                                          +----------+------------+---------+-----------+----------+-------+ Basilic       Full                                          +----------+------------+---------+-----------+----------+-------+  Left Findings: +----------+------------+---------+-----------+----------+-------+ LEFT      CompressiblePhasicitySpontaneousPropertiesSummary +----------+------------+---------+-----------+----------+-------+ IJV           Full       Yes       Yes                      +----------+------------+---------+-----------+----------+-------+ Subclavian    Full  Yes       Yes                      +----------+------------+---------+-----------+----------+-------+ Axillary      Full       Yes       Yes                      +----------+------------+---------+-----------+----------+-------+ Brachial      Full       Yes       Yes                      +----------+------------+---------+-----------+----------+-------+ Radial        Full                                          +----------+------------+---------+-----------+----------+-------+ Ulnar         Full                                          +----------+------------+---------+-----------+----------+-------+ Cephalic      Full                                          +----------+------------+---------+-----------+----------+-------+ Basilic       Full                                          +----------+------------+---------+-----------+----------+-------+  Summary:  Right: No evidence  of deep vein thrombosis in the upper extremity. No evidence of superficial vein thrombosis in the upper extremity.  Left: No evidence of deep vein thrombosis in the upper extremity. No evidence of superficial vein thrombosis in the upper extremity.  *See table(s) above for measurements and observations.  Diagnosing physician: Jamelle Haring Electronically signed by Jamelle Haring on 03/17/2022 at 2:46:01 PM.    Final    NM Pulmonary Perfusion  Result Date: 03/14/2022 CLINICAL DATA:  Concern for thrombus in the main pulmonary artery on echocardiography. EXAM: NUCLEAR MEDICINE PERFUSION LUNG SCAN TECHNIQUE: Perfusion images were obtained in multiple projections after intravenous injection of radiopharmaceutical. RADIOPHARMACEUTICALS:  4.4 mCi Tc-26m MAA IV COMPARISON:  Chest radiograph 03/13/2022 FINDINGS: Normal perfusion to both lungs. No large wedge-shaped or peripheral perfusion abnormalities. IMPRESSION: Normal perfusion in both lungs.  No evidence for pulmonary embolism. Electronically Signed   By: Markus Daft M.D.   On: 03/14/2022 16:57   ECHOCARDIOGRAM LIMITED  Result Date: 03/14/2022    ECHOCARDIOGRAM LIMITED REPORT   Patient Name:   [facility] Date of Exam: 03/14/2022 Medical Rec #:  [id number]       Height:       67.5 in Accession #:    [id number]      Weight:       141.0 lb Date of Birth:  [birth year]       BSA:          1.753 m Patient Age:    39 years        BP:           186/82  mmHg Patient Gender: F               HR:           84 bpm. Exam Location:  Inpatient Procedure: Limited Echo Indications:    O27.03 Acute systolic (congestive) heart failure  History:        Patient has prior history of Echocardiogram examinations, most                 recent 11/08/2021. CHF; Risk Factors:Hypertension, Diabetes and                 Dyslipidemia.  Sonographer:    Raquel Sarna Senior RDCS Referring Phys: [id number] Darreld Mclean  Sonographer Comments: Exam performed sitting upright for comfort. IMPRESSIONS  1. LV  walls have a speckled appearance. Left ventricular ejection fraction, by estimation, is 40 to 45%. Left ventricular ejection fraction by 2D MOD biplane is 44.5 %. The left ventricle has mildly decreased function. The left ventricle demonstrates global hypokinesis. There is severe left ventricular hypertrophy.  2. No definitive tamponade physiology, although the RV appears underfilled. a small pericardial effusion is present. The pericardial effusion is circumferential.  3. The aortic valve is tricuspid. Aortic valve regurgitation is not visualized.  4. Suggestion of possible thrombus in the main pulmonary artery at the PA bifurcation, which appears dilated (image 4). mildly dilated pulmonary artery.  5. The inferior vena cava is normal in size with <50% respiratory variability, suggesting right atrial pressure of 8 mmHg. Comparison(s): Changes from prior study are noted. 11/08/2021: LVEF 55-60%, severe LVH, speckled myocardium. Conclusion(s)/Recommendation(s): Cannot exclude possible thrombus at the PA bifurcation - consider further imaging with V/Q scan. Critical findings reported to Dr. Gardiner Rhyme and acknowledged at 2:43 pm. FINDINGS  Left Ventricle: LV walls have a speckled appearance. Left ventricular ejection fraction, by estimation, is 40 to 45%. Left ventricular ejection fraction by 2D MOD biplane is 44.5 %. The left ventricle has mildly decreased function. The left ventricle demonstrates global hypokinesis. There is severe left ventricular hypertrophy. Pericardium: No definitive tamponade physiology, although the RV appears underfilled. A small pericardial effusion is present. The pericardial effusion is circumferential. There is diastolic collapse of the right ventricular free wall and diastolic collapse of the right atrial wall. Aortic Valve: The aortic valve is tricuspid. Aortic valve regurgitation is not visualized. Pulmonary Artery: Suggestion of possible thrombus in the main pulmonary artery at the PA  bifurcation, which appears dilated (image 4). The pulmonary artery is mildly dilated. Venous: The inferior vena cava is normal in size with less than 50% respiratory variability, suggesting right atrial pressure of 8 mmHg.  LV Volumes (MOD)               Biplane EF (MOD) LV vol d, MOD    90.1 ml       LV Biplane EF:   Left A2C:                                            ventricular LV vol d, MOD    103.0 ml                       ejection A4C:  fraction by LV vol s, MOD    48.4 ml                        2D MOD A2C:                                            biplane is LV vol s, MOD    56.2 ml                        44.5 %. A4C: LV SV MOD A2C:   41.7 ml LV SV MOD A4C:   103.0 ml LV SV MOD BP:    43.0 ml Lyman Bishop MD Electronically signed by Lyman Bishop MD Signature Date/Time: 03/14/2022/2:45:58 PM    Final    US RENAL  Result Date: 03/14/2022 CLINICAL DATA:  61 year old female with acute renal insufficiency. EXAM: RENAL / URINARY TRACT ULTRASOUND COMPLETE COMPARISON:  Renal ultrasound 01/30/2022 and CT Chest, Abdomen, and Pelvis 11/06/2021. FINDINGS: Right Kidney: Renal measurements: 13.4 x 6.2 x 6.6 cm = volume: 289 mL. Echogenic right kidney (image 2), similar to last month. No solid right renal mass or hydronephrosis. Small benign upper pole cyst (no follow-up imaging recommended). Left Kidney: Renal measurements: 12.2 x 7.9 x 6.5 cm = volume: 328 mL. Echogenic left kidney similar to the last month. No solid left renal mass or hydronephrosis. Bladder: Appears normal for degree of bladder distention. Other: None. IMPRESSION: Chronic medical renal disease. No acute renal finding. Electronically Signed   By: Genevie Ann M.D.   On: 03/14/2022 07:17   DG Chest 2 View  Result Date: 03/13/2022 CLINICAL DATA:  Shortness of breath EXAM: CHEST - 2 VIEW COMPARISON:  12/18/2021 FINDINGS: Cardiomegaly. Small bilateral pleural effusions. The visualized skeletal structures are  unremarkable. IMPRESSION: Cardiomegaly and small bilateral pleural effusions. Electronically Signed   By: Delanna Ahmadi M.D.   On: 03/13/2022 17:08     Discharge Exam: Vitals:   03/17/22 2021 03/18/22 0500  BP: (!) 150/78 (!) 181/83  Pulse: 88 87  Resp: 18 (!) 22  Temp: 98.9 F (37.2 C) 98.2 F (36.8 C)  SpO2: 96% 98%   Vitals:   03/17/22 1216 03/17/22 2021 03/18/22 0500 03/18/22 0500  BP: (!) 145/66 (!) 150/78  (!) 181/83  Pulse: 85 88  87  Resp: 16 18  (!) 22  Temp: 97.8 F (36.6 C) 98.9 F (37.2 C)  98.2 F (36.8 C)  TempSrc: Oral Oral  Oral  SpO2: 96% 96%  98%  Weight:   62.8 kg   Height:        General: Pt is alert, awake, not in acute distress Cardiovascular: RRR, S1/S2 +, no rubs, no gallops Respiratory: CTA bilaterally, no wheezing, no rhonchi Abdominal: Soft, NT, ND, bowel sounds + Extremities: no edema, no cyanosis    The results of significant diagnostics from this hospitalization (including imaging, microbiology, ancillary and laboratory) are listed below for reference.     Microbiology: Recent Results (from the past 240 hour(s))  SARS Coronavirus 2 by RT PCR (hospital order, performed in [facility] hospital lab) *cepheid single result test* Anterior Nasal Swab     Status: None   Collection Time: 03/14/22 12:51 AM   Specimen: Anterior Nasal Swab  Result Value Ref Range Status   SARS Coronavirus 2 by RT  PCR NEGATIVE NEGATIVE Final    Comment: (NOTE) SARS-CoV-2 target nucleic acids are NOT DETECTED.  The SARS-CoV-2 RNA is generally detectable in upper and lower respiratory specimens during the acute phase of infection. The lowest concentration of SARS-CoV-2 viral copies this assay can detect is 250 copies / mL. A negative result does not preclude SARS-CoV-2 infection and should not be used as the sole basis for treatment or other patient management decisions.  A negative result may occur with improper specimen collection / handling, submission of  specimen other than nasopharyngeal swab, presence of viral mutation(s) within the areas targeted by this assay, and inadequate number of viral copies (<250 copies / mL). A negative result must be combined with clinical observations, patient history, and epidemiological information.  Fact Sheet for Patients:   https://www.patel.info/  Fact Sheet for Healthcare Providers: https://hall.com/  This test is not yet approved or  cleared by the [place] FDA and has been authorized for detection and/or diagnosis of SARS-CoV-2 by FDA under an Emergency Use Authorization (EUA).  This EUA will remain in effect (meaning this test can be used) for the duration of the COVID-19 declaration under Section 564(b)(1) of the Act, 21 U.S.C. section 360bbb-3(b)(1), unless the authorization is terminated or revoked sooner.  Performed at [facility], [place] [address] [place], [place] [address]      Labs: BNP (last 3 results) Recent Labs    05/07/21 1610 11/21/21 1533 03/13/22 1713  BNP 648.5* 335.2* 604.5*   Basic Metabolic Panel: Recent Labs  Lab 03/14/22 0111 03/14/22 0532 03/15/22 0558 03/16/22 0549 03/17/22 0547 03/18/22 0545  NA  --  142 142 139 140 140  K  --  2.9* 3.2* 3.1* 3.4* 3.8  CL  --  112* 113* 110 114* 114*  CO2  --  22 21* 22 22 21*  GLUCOSE  --  153* 139* 109* 116* 125*  BUN  --  37* 34* 34* 35* 34*  CREATININE  --  3.64* 3.39* 3.20* 3.26* 3.43*  CALCIUM  --  8.2* 8.0* 7.8* 8.0* 7.9*  MG 1.7  --   --   --  1.8  --   PHOS  --   --  4.7*  --   --   --    Liver Function Tests: Recent Labs  Lab 03/13/22 2219 03/15/22 0558  AST 14*  --   ALT 15  --   ALKPHOS 142*  --   BILITOT 0.3  --   PROT 5.5*  --   ALBUMIN 2.1* 2.2*   No results for input(s): "LIPASE", "AMYLASE" in the last 168 hours. No results for input(s): "AMMONIA" in the last 168 hours. CBC: Recent Labs  Lab 03/13/22 1713  03/15/22 0558 03/18/22 0545  WBC 8.1 7.4 7.8  NEUTROABS 6.5  --  5.7  HGB 8.5* 8.4* 7.8*  HCT 26.4* 26.2* 24.6*  MCV 88.3 87.6 88.5  PLT 342 340 287   Cardiac Enzymes: No results for input(s): "CKTOTAL", "CKMB", "CKMBINDEX", "TROPONINI" in the last 168 hours. BNP: Invalid input(s): "POCBNP" CBG: Recent Labs  Lab 03/17/22 1214 03/17/22 1613 03/17/22 2119 03/18/22 0807 03/18/22 1144  GLUCAP 173* 238* 174* 126* 107*   D-Dimer No results for input(s): "DDIMER" in the last 72 hours. Hgb A1c No results for input(s): "HGBA1C" in the last 72 hours. Lipid Profile No results for input(s): "CHOL", "HDL", "LDLCALC", "TRIG", "CHOLHDL", "LDLDIRECT" in the last 72 hours. Thyroid function studies No results for input(s): "TSH", "T4TOTAL", "T3FREE", "  THYROIDAB" in the last 72 hours.  Invalid input(s): "FREET3" Anemia work up Recent Labs    03/16/22 0549  FERRITIN 206  TIBC 211*  IRON 50   Urinalysis    Component Value Date/Time   COLORURINE YELLOW 03/14/2022 0620   APPEARANCEUR HAZY (A) 03/14/2022 0620   LABSPEC 1.014 03/14/2022 0620   PHURINE 5.0 03/14/2022 0620   GLUCOSEU 150 (A) 03/14/2022 0620   HGBUR SMALL (A) 03/14/2022 0620   BILIRUBINUR NEGATIVE 03/14/2022 0620   KETONESUR NEGATIVE 03/14/2022 0620   PROTEINUR >300 (A) 03/14/2022 0620   UROBILINOGEN 0.2 09/29/2011 2007   NITRITE NEGATIVE 03/14/2022 0620   LEUKOCYTESUR NEGATIVE 03/14/2022 0620   Sepsis Labs Recent Labs  Lab 03/13/22 1713 03/15/22 0558 03/18/22 0545  WBC 8.1 7.4 7.8   Microbiology Recent Results (from the past 240 hour(s))  SARS Coronavirus 2 by RT PCR (hospital order, performed in [facility] hospital lab) *cepheid single result test* Anterior Nasal Swab     Status: None   Collection Time: 03/14/22 12:51 AM   Specimen: Anterior Nasal Swab  Result Value Ref Range Status   SARS Coronavirus 2 by RT PCR NEGATIVE NEGATIVE Final    Comment: (NOTE) SARS-CoV-2 target nucleic acids are NOT  DETECTED.  The SARS-CoV-2 RNA is generally detectable in upper and lower respiratory specimens during the acute phase of infection. The lowest concentration of SARS-CoV-2 viral copies this assay can detect is 250 copies / mL. A negative result does not preclude SARS-CoV-2 infection and should not be used as the sole basis for treatment or other patient management decisions.  A negative result may occur with improper specimen collection / handling, submission of specimen other than nasopharyngeal swab, presence of viral mutation(s) within the areas targeted by this assay, and inadequate number of viral copies (<250 copies / mL). A negative result must be combined with clinical observations, patient history, and epidemiological information.  Fact Sheet for Patients:   https://www.patel.info/  Fact Sheet for Healthcare Providers: https://hall.com/  This test is not yet approved or  cleared by the [place] FDA and has been authorized for detection and/or diagnosis of SARS-CoV-2 by FDA under an Emergency Use Authorization (EUA).  This EUA will remain in effect (meaning this test can be used) for the duration of the COVID-19 declaration under Section 564(b)(1) of the Act, 21 U.S.C. section 360bbb-3(b)(1), unless the authorization is terminated or revoked sooner.  Performed at [facility], [place] [address] [place], [place] [address]      Time coordinating discharge: Over 30 minutes  SIGNED:   Darliss Cheney, MD  [facility] 03/18/2022, 1:10 PM *Please note that this is a verbal dictation therefore any spelling or grammatical errors are due to the "[place]" system interpretation. If 7PM-7AM, please contact night-coverage [email or web address]

## 2022-03-18 NOTE — Progress Notes (Addendum)
[facility] Progress Note  Subjective: seen in room, seen by IR, earliest biopsy would be Thursday, they have new policy about holding ASA for 3-5 days pre renal bx because of some serious bleeding problems they have had. Pt is upset and wants to go home.   Vitals:   03/17/22 1216 03/17/22 2021 03/18/22 0500 03/18/22 0500  BP: (!) 145/66 (!) 150/78  (!) 181/83  Pulse: 85 88  87  Resp: 16 18  (!) 22  Temp: 97.8 F (36.6 C) 98.9 F (37.2 C)  98.2 F (36.8 C)  TempSrc: Oral Oral  Oral  SpO2: 96% 96%  98%  Weight:   62.8 kg   Height:        Exam: Gen:NAD CVS:RRR Resp: dec'd BS at bases, normal WOB Abd: soft, nt/nd Ext: no pitting pretib/ hip edema, trace- 1+ ankle edema bilat Neuro: awake, alert       Date   Creat  eGFR   2013- 2020   2022   0.95- 1.44    March 2023  1.73  34 ml/min, ckd 3b   April 2023  1.67- 2.42 22- 35 ml/min, ckd 3b- 29 Nov 2021  2.00     July 2023   2.57- 3.66 14- 21 ml/min, ckd 4   8/16   3.59  14 ml/min   8/17   3.39   03/15/22  3.20  16 ml/min     UA 8/17 - >300 protein, 21-50 wbc, 0-5 rbc, no epis, many bact    UNa 85, UCr 66    Renal US - 13.4/ 12.2 cm kidneys w/o hydro, echo wnl    CXR - small bilat pleural effusions, no edema   Assessment/ Plan: AKI on CKD 4 w/ nephrotic syndrome - b/l creat is 2.6- 3.2 from July 2023. Has had significant progression over the last year. Creat 3.59 on admission and has been stable / down slightly w/ diuresis. Vol overload resolved sp diuresis. IP kidney biopsy will not be possible since there is a 3-5 day waiting period for pts on aspirin. She already has an OP bx date of Aug 30th, she wants to go home and do the biopsy on Aug 30th as an OP. OK for dc. Please resume torsemide 100 mg qam at dc. Will sign off.  Vol excess - resolved, IV lasix dc'd. Euvolemic on exam, on room air.  HTN'sive urgency - getting po hydralazine, norvasc and imdur. BP's mostly controlled, they were high on admission  (200's SBP).  DM2 - on insulin, poorly controlled. Last A1C 9.7 in July.  H/o CVA  Rob Beverly Suriano 03/18/2022, 8:30 AM   Recent Labs  Lab 03/13/22 2219 03/14/22 0532 03/15/22 0558 03/16/22 0549 03/17/22 0547 03/18/22 0545  HGB  --   --  8.4*  --   --  7.8*  ALBUMIN 2.1*  --  2.2*  --   --   --   CALCIUM 7.6*   < > 8.0*   < > 8.0* 7.9*  PHOS  --   --  4.7*  --   --   --   CREATININE 3.59*   < > 3.39*   < > 3.26* 3.43*  K 2.7*   < > 3.2*   < > 3.4* 3.8   < > = values in this interval not displayed.    Recent Labs  Lab 03/16/22 0549  IRON 50  TIBC 211*  FERRITIN 206    Inpatient medications:  amLODipine  10 mg Oral Daily   aspirin EC  81 mg Oral Daily   carvedilol  12.5 mg Oral BID WC   heparin  5,000 Units Subcutaneous Q8H   hydrALAZINE  50 mg Oral Q8H   insulin aspart  0-5 Units Subcutaneous QHS   insulin aspart  0-9 Units Subcutaneous TID WC   isosorbide mononitrate  30 mg Oral Daily   rosuvastatin  40 mg Oral Daily    acetaminophen, chlorpheniramine-HYDROcodone, hydrALAZINE, prochlorperazine

## 2022-03-18 NOTE — Progress Notes (Addendum)
Progress Note  Patient Name: Amber Cross Date of Encounter: 03/18/2022  [facility] Cardiologist: Berniece Salines, DO   Subjective   Patient states her SOB and swelling are improved, waiting for kidney biopsy today, felt her urine output is slowing down.   Inpatient Medications    Scheduled Meds:  amLODipine  10 mg Oral Daily   aspirin EC  81 mg Oral Daily   carvedilol  12.5 mg Oral BID WC   heparin  5,000 Units Subcutaneous Q8H   hydrALAZINE  50 mg Oral Q8H   insulin aspart  0-5 Units Subcutaneous QHS   insulin aspart  0-9 Units Subcutaneous TID WC   isosorbide mononitrate  30 mg Oral Daily   rosuvastatin  40 mg Oral Daily   Continuous Infusions:  PRN Meds: acetaminophen, chlorpheniramine-HYDROcodone, hydrALAZINE, prochlorperazine   Vital Signs    Vitals:   03/17/22 1216 03/17/22 2021 03/18/22 0500 03/18/22 0500  BP: (!) 145/66 (!) 150/78  (!) 181/83  Pulse: 85 88  87  Resp: 16 18  (!) 22  Temp: 97.8 F (36.6 C) 98.9 F (37.2 C)  98.2 F (36.8 C)  TempSrc: Oral Oral  Oral  SpO2: 96% 96%  98%  Weight:   62.8 kg   Height:        Intake/Output Summary (Last 24 hours) at 03/18/2022 0758 Last data filed at 03/18/2022 0026 Gross per 24 hour  Intake 600 ml  Output 1225 ml  Net -625 ml      03/18/2022    5:00 AM 03/17/2022    5:54 AM 03/16/2022    7:01 AM  Last 3 Weights  Weight (lbs) 138 lb 7.2 oz 137 lb 12.8 oz 141 lb 9.6 oz  Weight (kg) 62.8 kg 62.506 kg 64.229 kg      Telemetry    Normal sinus rhythm, occasional PVCs- Personally Reviewed  ECG    No new ECG today- Personally Reviewed  Physical Exam   GEN: No acute distress.   Neck: no JVD Cardiac: RRR, no murmurs, rubs, or gallops.  Respiratory:Crackles and wheezing bilaterally, on room air, speaks full sentence  GI: Soft, nontender, non-distended  MS: trace BLE edema Neuro:  Nonfocal  Psych: Normal affect   Labs    High Sensitivity Troponin:   Recent Labs  Lab 03/13/22 1713  03/13/22 2219  TROPONINIHS 19* 20*     Chemistry Recent Labs  Lab 03/13/22 2219 03/14/22 0111 03/14/22 0532 03/15/22 0558 03/16/22 0549 03/17/22 0547 03/18/22 0545  NA 141  --    < > 142 139 140 140  K 2.7*  --    < > 3.2* 3.1* 3.4* 3.8  CL 112*  --    < > 113* 110 114* 114*  CO2 18*  --    < > 21* 22 22 21*  GLUCOSE 221*  --    < > 139* 109* 116* 125*  BUN 37*  --    < > 34* 34* 35* 34*  CREATININE 3.59*  --    < > 3.39* 3.20* 3.26* 3.43*  CALCIUM 7.6*  --    < > 8.0* 7.8* 8.0* 7.9*  MG  --  1.7  --   --   --  1.8  --   PROT 5.5*  --   --   --   --   --   --   ALBUMIN 2.1*  --   --  2.2*  --   --   --  AST 14*  --   --   --   --   --   --   ALT 15  --   --   --   --   --   --   ALKPHOS 142*  --   --   --   --   --   --   BILITOT 0.3  --   --   --   --   --   --   GFRNONAA 14*  --    < > 15* 16* 16* 15*  ANIONGAP 11  --    < > 8 7 4* 5   < > = values in this interval not displayed.    Lipids No results for input(s): "CHOL", "TRIG", "HDL", "LABVLDL", "LDLCALC", "CHOLHDL" in the last 168 hours.  Hematology Recent Labs  Lab 03/13/22 1713 03/15/22 0558 03/18/22 0545  WBC 8.1 7.4 7.8  RBC 2.99* 2.99* 2.78*  HGB 8.5* 8.4* 7.8*  HCT 26.4* 26.2* 24.6*  MCV 88.3 87.6 88.5  MCH 28.4 28.1 28.1  MCHC 32.2 32.1 31.7  RDW 15.9* 15.6* 15.3  PLT 342 340 287   Thyroid No results for input(s): "TSH", "FREET4" in the last 168 hours.  BNP Recent Labs  Lab 03/13/22 1713  BNP 438.8*    DDimer No results for input(s): "DDIMER" in the last 168 hours.   Radiology    DG CHEST PORT 1 VIEW  Result Date: 03/17/2022 CLINICAL DATA:  Shortness of breath, cough, congestion EXAM: PORTABLE CHEST 1 VIEW COMPARISON:  03/13/2022 FINDINGS: Gross cardiomegaly. Small left pleural effusion no significant right pleural effusion. Mild diffuse interstitial pulmonary opacity. The visualized skeletal structures are unremarkable. IMPRESSION: Gross cardiomegaly with small left pleural effusion and  mild diffuse interstitial pulmonary opacity, likely edema. No focal airspace opacity. Electronically Signed   By: Delanna Ahmadi M.D.   On: 03/17/2022 15:59    Cardiac Studies   Echo from 03/14/22:   1. LV walls have a speckled appearance. Left ventricular ejection  fraction, by estimation, is 40 to 45%. Left ventricular ejection fraction  by 2D MOD biplane is 44.5 %. The left ventricle has mildly decreased  function. The left ventricle demonstrates  global hypokinesis. There is severe left ventricular hypertrophy.   2. No definitive tamponade physiology, although the RV appears  underfilled. a small pericardial effusion is present. The pericardial  effusion is circumferential.   3. The aortic valve is tricuspid. Aortic valve regurgitation is not  visualized.   4. Suggestion of possible thrombus in the main pulmonary artery at the PA  bifurcation, which appears dilated (image 4). mildly dilated pulmonary  artery.   5. The inferior vena cava is normal in size with <50% respiratory  variability, suggesting right atrial pressure of 8 mmHg.   Comparison(s): Changes from prior study are noted. 11/08/2021: LVEF 55-60%,  severe LVH, speckled myocardium  Patient Profile     61 y.o. female with a history of chronic combined heart failure, CKD stage IV, CVA, small cell lung cancer, tobacco use, hypertension who cardiology is consulted for evaluation of heart failure   Assessment & Plan    Anasarca Nephrotic syndrome Acute on Chronic Combined CHF  Patient has a history of chronic combined CHF with a EF as low as 25% in 04/2021 but subsequently normalized.  Last echo in 10/2021 showed LVEF of 55-60% with severe concentric LVH and grade 1 diastolic dysfunction, normal RV, moderately dilated left atrium. There has been concern for  amyloidosis. However, PYP scan in 04/2021 was equivocal for transthyretin amyloidosis and cardiac MRI in 08/2021 at [facility] showed no evidence of infiltrative disease.   Cardiomyopathy has been felt to be non-ischemic in the past given prior stress test in 12/2020 showed no evidence of ischemia or prior infarct.  Patient now presents with worsening shortness of breath and lower extremity edema and was found to be significantly volume overloaded on presentation, etiology is multifactorial due to nephrotic syndrome as well as CHF  BNP elevated at 438.  Chest x-ray shows cardiomegaly and small bilateral pleural effusions. Creatinine 3.59 and potassium 2.7 on admission. - Echo this admission shows EF 40 to 45% - Continue diuresis per nephrology, noted lasix held now , UOP 1257ml over the past 24 hr  - GDMT: Coreg 12.5 mg BID, hydralazine 50mg  TID, Imdur 30mg  daily; No ACEi/ARB/ARNI/ MRA or SGLT2i due to renal function. - Continue to monitor daily weights, strict I's and O's, and renal function. Dietary compliance is an issue for her, needs to limit sodium intake to 2000mg  a day.  Chest Pain Patient has constant chest pain that she describes as a squeezing sensation. She has had this for months and it is unchanged. Not worse with exertion. EKG shows no acute ischemic changes.  High-sensitivity troponin 19 >> 20.   - Symptoms very atypical. Do not think this is cardiac in nature. No ischemic work-up necessary at this time.    Hypertensive Urgency BP as high as 207/90 on admission.  Home regimen includes Amlodipine 10 mg daily, Coreg 25 mg daily, Hydralazine 50 mg three times daily.  Most recent BP improved with systolic BP in the 299M -Can continue amlodipine 10mg  daily and Hydralazine 50mg  three time daily and Imdur 30 mg daily -will increase Coreg from 6.25 to 12.5mg  BID today, can titrate as needed   Acute on CKD Stage IV Nephrotic syndrome Creatinine 3.59 on admission and is 3.43 today. Creatinine during recent admission last month peaked at 3.66 but then improved. Last creatinine on 02/20/2022 was 2.8. Renal U/S 03/14/22 showed chronic renal disease.  - Continue to  avoid nephrotoxic agents. - Continue diuresis per nephrology - Pending renal biopsy   ?Thrombus on echo There was concern for possible thrombus in main pulmonary artery on echo, though not well visualized.  VQ scan shows no evidence of PE  Hyperlipidemia - Continue home Crestor 40mg  daily.   Hypokalemia Potassium 2.7 on arrival. Patient reports chronic diarrhea which may be the cause. - Continue supplementation per primary team.   Prolonged QTc QTc 528 ms on initial EKG, improved to 493 on EKG 8/18.  Suspect driven by hypokalemia   Otherwise, per primary team: - Small cell lung cancer - Poorly controlled type 2 diabetes mellitus - Metabolic acidosis - CVA - Chronic anemia  For questions or updates, please contact [facility] Please consult [email or web address] for contact info under        Signed, Margie Billet, NP  03/18/2022, 7:58 AM    Personally seen and examined. Agree with APP above with the following comments:  Briefly 61 yo F with a history of HFrEF with concerns of amyloid vs hypertensive disease.  Complicated by AKI, Nephrotic Syndrome and CKD Stage IV, Tobacco use and SLCL  Patient notes that she is doing terribly: she was waiting all weekend for a kidney biopsy; this was not done because she had received ASA.  She can't eat the food here, no one can bring her food, and she does not  want to be stuck her for another week waiting biopsy. No CP, SOB, Palpitations.  Exam notable for decreased breath sounds in bases, bilateral wheezes, distress mood and affect, otherwise as above.  Tele: SR with rare PVCs  Would recommend  - Will continue to uptitrate COREG and tomorrow may increase to 25 mg PO daily; OK to uptitrate her Imdur next and then her hydralazine. - she is wondering if she can get some of this therapy as an outpatient; If we can get her BP to at least 140s-150s we may be able to complete her medication regimen as outpatient  No cardiac indication for ASA,  indication is CVA and its ok to be held from cardiac perspective  Rudean Haskell, MD [address] [place], [place] [address] [phone]  12:59 PM

## 2022-03-19 ENCOUNTER — Ambulatory Visit (INDEPENDENT_AMBULATORY_CARE_PROVIDER_SITE_OTHER): Payer: Commercial Managed Care - HMO | Admitting: Student

## 2022-03-19 ENCOUNTER — Encounter: Payer: Self-pay | Admitting: Student

## 2022-03-19 VITALS — BP 149/69 | HR 75 | Wt 138.3 lb

## 2022-03-19 DIAGNOSIS — I10 Essential (primary) hypertension: Secondary | ICD-10-CM

## 2022-03-19 DIAGNOSIS — N185 Chronic kidney disease, stage 5: Secondary | ICD-10-CM | POA: Diagnosis not present

## 2022-03-19 DIAGNOSIS — I130 Hypertensive heart and chronic kidney disease with heart failure and stage 1 through stage 4 chronic kidney disease, or unspecified chronic kidney disease: Secondary | ICD-10-CM | POA: Diagnosis not present

## 2022-03-19 DIAGNOSIS — R809 Proteinuria, unspecified: Secondary | ICD-10-CM

## 2022-03-19 DIAGNOSIS — E1122 Type 2 diabetes mellitus with diabetic chronic kidney disease: Secondary | ICD-10-CM | POA: Diagnosis not present

## 2022-03-19 DIAGNOSIS — Z794 Long term (current) use of insulin: Secondary | ICD-10-CM

## 2022-03-19 DIAGNOSIS — I5022 Chronic systolic (congestive) heart failure: Secondary | ICD-10-CM

## 2022-03-19 DIAGNOSIS — Z0000 Encounter for general adult medical examination without abnormal findings: Secondary | ICD-10-CM

## 2022-03-19 DIAGNOSIS — F17210 Nicotine dependence, cigarettes, uncomplicated: Secondary | ICD-10-CM

## 2022-03-19 DIAGNOSIS — R079 Chest pain, unspecified: Secondary | ICD-10-CM

## 2022-03-19 MED ORDER — NOVOLIN 70/30 FLEXPEN RELION (70-30) 100 UNIT/ML ~~LOC~~ SUPN: PEN_INJECTOR | SUBCUTANEOUS | 2 refills | Status: DC

## 2022-03-19 MED ORDER — DEXCOM G7 SENSOR MISC: 2 refills | Status: DC

## 2022-03-19 NOTE — Assessment & Plan Note (Signed)
Patient was admitted for hypertensive urgency in the setting of volume overload.  Blood pressure today has improved significantly at 149/69.  Patient report adherence to medication. Current regimen: -Hydralazine 50 mg 3 times daily -Amlodipine 10 mg daily -Imdur 30 mg daily -Coreg 25 mg twice daily -Torsemide 100 mg daily  We will not adjust her medications today given significant improvement.  Suspect her blood pressure will continues to improve after more diuresis. -Continue current regimens

## 2022-03-19 NOTE — Assessment & Plan Note (Signed)
Patient reports chest pain during this admission.  Her troponin was mildly elevated but was flat.  EKG did not show acute ischemia.  Cardiology was consulted and thought her chest pain was noncardiac in nature.  They did not recommend further ischemic work-up.  I wonder if her chest pain could be secondary to her lung cancer.  -No further work-up at this time

## 2022-03-19 NOTE — Assessment & Plan Note (Addendum)
A1c of 9.7 in July.  Patient has been using Dexcom but ran out before this hospitalization.  She is currently giving 15 units of regular insulin in AM and 10 units in p.m.  She eats 2 meals a day which is breakfast and dinner.  She typically give her insulin before meals.  Patient reports 2-3 episodes of hypoglycemia in the morning with lowest value of 46.  Because of that, she has been holding her insulin if her CBG is less than 120.  The summarized report from her Dexcom showed that 76% of CBG within range and only 1% low value.  We could not obtain a full report without Donna's help.  -Adjust her insulin to 15 units in a.m. and 5 units in p.m. to avoid morning hypoglycemia -Refill her Dexcom -She is not a candidate for metformin or SGLT2 or GLP-1 due to her CKD 5 -Follow-up in 2 to 3 weeks

## 2022-03-19 NOTE — Assessment & Plan Note (Addendum)
Patient was admitted for acute hypoxic respiratory failure secondary to volume overload in the setting of nephrotic syndrome.  Her HFrEF may be contributing.  Cardiology was consulted.  Repeat echocardiogram show EF 40-45%, severe LVH with RA underfilled.  There was a questionable thrombus in the main pulmonary artery but VQ scan and DVT Doppler were negative.  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  Cardiology did not recommend ischemic work-up.  She is not a candidate for ACE/ARB or SGLT2 or spironolactone due to CKD 5.  -Continue Coreg and Imdur -Diuresis with torsemide

## 2022-03-19 NOTE — Assessment & Plan Note (Addendum)
Patient was admitted for acute hypoxic respiratory failure secondary to volume overload in the setting of nephrotic syndrome.  Nephrology was consulted during this admission.  Her GFR remains in the 14-15 range.  Her creatinine ranges from 3.2-3.6.  She was diuresed with IV Lasix 80 mg twice daily with improvement of her edema.  Her Lasix was discontinue on 8/20 and patient was started on torsemide 100 mg daily.  Patient was scheduled for an inpatient renal biopsy but she did not want to stay in the hospital for the extra 3-5 days for aspirin washout.  She is scheduled for a biopsy on 8/30 and patient knows to stop her aspirin 5 days prior.  I suspect her nephrotic syndrome is secondary to uncontrolled diabetes and hypertension.  Physical exam showed persistent LE edema but improved.  Patient has no signs of respiratory distress or hypoxia.  Her weight is stable from discharge.  -Continue diuresis with torsemide -Renal biopsy on 8/30th -Follow-up with nephrology after biopsy for repeat BMP

## 2022-03-19 NOTE — Progress Notes (Addendum)
CC: Hospital follow-up for chest pain, shortness of breath and swelling  HPI:  [name] is a 61 y.o. female with past medical history of hypertension, nephrotic syndrome, hyperlipidemia, HFrEF, CVA, lungs squamous cell carcinoma s/p radiation who presents to the clinic for recent hospital follow-up.  She was discharged on 8/21.  Please see problem based charting for detail  Past Medical History:  Diagnosis Date   Anemia    Arthritis    CHF (congestive heart failure) ([place])    Chronic kidney disease    Depression    Diabetes mellitus    Headache    Hypercholesteremia    Hypertension    Pseudogout    Stroke ([place]) 10/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   Tobacco abuse    Review of Systems:  per HPI  Physical Exam:  Vitals:   03/19/22 1434  BP: (!) 149/69  Pulse: 75  SpO2: 99%  Weight: 138 lb 4.8 oz (62.7 kg)   Physical Exam Constitutional:      General: She is not in acute distress. HENT:     Head: Normocephalic.  Eyes:     General:        Right eye: No discharge.        Left eye: No discharge.     Conjunctiva/sclera: Conjunctivae normal.  Cardiovascular:     Rate and Rhythm: Normal rate and regular rhythm.     Heart sounds: Normal heart sounds. No murmur heard.    Comments: JVD to mid neck.  +2 LE edema but appeared improved. Pulmonary:     Effort: Pulmonary effort is normal. No respiratory distress.     Comments: Mild crackles heard bilateral basal to mid lungs field. Musculoskeletal:     Cervical back: Normal range of motion.  Skin:    General: Skin is warm.     Coloration: Skin is not jaundiced.  Neurological:     General: No focal deficit present.     Mental Status: She is alert and oriented to person, place, and time.  Psychiatric:        Mood and Affect: Mood normal.        Behavior: Behavior normal.      Assessment & Plan:   See Encounters Tab for problem based charting.  Essential hypertension Patient was admitted for hypertensive urgency in  the setting of volume overload.  Blood pressure today has improved significantly at 149/69.  Patient report adherence to medication. Current regimen: -Hydralazine 50 mg 3 times daily -Amlodipine 10 mg daily -Imdur 30 mg daily -Coreg 25 mg twice daily -Torsemide 100 mg daily  We will not adjust her medications today given significant improvement.  Suspect her blood pressure will continues to improve after more diuresis. -Continue current regimens  Chronic systolic CHF (congestive heart failure) ([place]) Patient was admitted for acute hypoxic respiratory failure secondary to volume overload in the setting of nephrotic syndrome.  Her HFrEF may be contributing.  Cardiology was consulted.  Repeat echocardiogram show EF 40-45%, severe LVH with RA underfilled.  There was a questionable thrombus in the main pulmonary artery but VQ scan and DVT Doppler were negative.  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  Cardiology did not recommend ischemic work-up.  She is not a candidate for ACE/ARB or SGLT2 or spironolactone due to CKD 5.  -Continue Coreg and Imdur -Diuresis with torsemide   Chest pain Patient reports chest pain during this admission.  Her troponin was mildly elevated but was  flat.  EKG did not show acute ischemia.  Cardiology was consulted and thought her chest pain was noncardiac in nature.  They did not recommend further ischemic work-up.  I wonder if her chest pain could be secondary to her lung cancer.  -No further work-up at this time  Nephrotic range proteinuria Patient was admitted for acute hypoxic respiratory failure secondary to volume overload in the setting of nephrotic syndrome.  Nephrology was consulted during this admission.  Her GFR remains in the 14-15 range.  Her creatinine ranges from 3.2-3.6.  She was diuresed with IV Lasix 80 mg twice daily with improvement of her edema.  Her Lasix was discontinue on 8/20 and patient was started on torsemide 100  mg daily.  Patient was scheduled for an inpatient renal biopsy but she did not want to stay in the hospital for the extra 3-5 days for aspirin washout.  She is scheduled for a biopsy on 8/30 and patient knows to stop her aspirin 5 days prior.  I suspect her nephrotic syndrome is secondary to uncontrolled diabetes and hypertension.  Physical exam showed persistent LE edema but improved.  Patient has no signs of respiratory distress or hypoxia.  Her weight is stable from discharge.  -Continue diuresis with torsemide -Renal biopsy on 8/30th -Follow-up with nephrology after biopsy for repeat BMP  Type 2 diabetes mellitus with chronic kidney disease, with long-term current use of insulin (HCC) A1c of 9.7 in July.  Patient has been using Dexcom but ran out before this hospitalization.  She is currently giving 15 units of regular insulin in AM and 10 units in p.m.  She eats 2 meals a day which is breakfast and dinner.  She typically give her insulin before meals.  Patient reports 2-3 episodes of hypoglycemia in the morning with lowest value of 46.  Because of that, she has been holding her insulin if her CBG is less than 120.  The summarized report from her Dexcom showed that 76% of CBG within range and only 1% low value.  We could not obtain a full report without Donna's help.  -Adjust her insulin to 15 units in a.m. and 5 units in p.m. to avoid morning hypoglycemia -Refill her Dexcom -She is not a candidate for metformin or SGLT2 or GLP-1 due to her CKD 5 -Follow-up in 2 to 3 weeks  Healthcare maintenance - Patient is due for cancer prevention screening such as colonoscopy, mammogram and Pap smear.  She is currently uninsured.  We will work on Games developer for her.   Patient discussed with Dr.  Cain Sieve

## 2022-03-19 NOTE — Assessment & Plan Note (Signed)
-   Patient is due for cancer prevention screening such as colonoscopy, mammogram and Pap smear.  She is currently uninsured.  We will work on Games developer for her.

## 2022-03-19 NOTE — Progress Notes (Signed)
  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     Indication for treatment:  Curative, Definitive SBRT       Radiation treatment dates:   6/27-01/31/22  Site/dose:   The target was treated to 54 Gy in 3 fractions of 18 Gy  Beams/energy:   The patient was treated using stereotactic body radiotherapy according to a 3D conformal radiotherapy plan.  Volumetric arc fields were employed to deliver 6 MV X-rays.  Image guidance was performed with per fraction cone beam CT prior to treatment under personal MD supervision.  Immobilization was achieved using BodyFix Pillow.  Narrative: The patient tolerated radiation treatment relatively well.     Plan: The patient has completed radiation treatment. The patient will return to radiation oncology clinic for routine followup in one month. I advised them to call or return sooner if they have any questions or concerns related to their recovery or treatment. ________________________________  Sheral Apley. Tammi Klippel, M.D.

## 2022-03-19 NOTE — Patient Instructions (Signed)
Ms. Carbonell,  It was nice seeing you in the clinic today.  I am glad that you are doing better.  Here is a summary what we talked about:  1.  Your blood pressure has improved significantly.  I think it is going to keep improving after we remove more fluid from your body.  Please continue hydralazine, Imdur, amlodipine, Coreg and torsemide.  2.  Kidney disease: Make sure you follow-up with the biopsy on 8/30.  Please hold aspirin 5 days before the procedure.  Please follow-up with the kidney doctor office after your biopsy for repeat blood work.  3.  Your blood sugar numbers are better.  Please inject 15 units of insulin in the morning and 5 units at night to avoid low blood sugar the next morning.  I also ordered a refill of your Dexcom.  Please return in 2-3 weeks after your biopsy.  Take care  Dr. Alfonse Spruce

## 2022-03-20 ENCOUNTER — Telehealth: Payer: Self-pay

## 2022-03-20 NOTE — Telephone Encounter (Signed)
PA for patient (dexcom G7 sensor) came through on cover my meds ,was submitted with last office notes awaiting approval or denial.

## 2022-03-20 NOTE — Progress Notes (Signed)
Internal Medicine Clinic Attending  Case discussed with Dr. Alfonse Spruce  At the time of the visit.  We reviewed the resident's history and exam and pertinent patient test results.  I agree with the assessment, diagnosis, and plan of care documented in the resident's note.    Patient here for follow-up after recent admission for nephrotic syndrome. He weight is stable since discharge. BP is well controlled. She's taking all meds, including Torsemide 100mg  daily. Has been following closely with renal, and has biopsy planned next week - she knows to hold her aspirin for 5 days before the biopsy.

## 2022-03-20 NOTE — Telephone Encounter (Signed)
Renea Degeorge Key: [id number] - PA Case ID: [id number] - Rx #: [id number] Need help? Call us at [phone] Outcome Approvedtoday CaseId:[id number];Status:Approved;Review Type:Prior Auth;Coverage Start Date:03/20/2022;Coverage End Date:03/20/2023; Drug Dexcom G7 Sensor Form [employer] Electronic PA Form [phone] NCPDP) Original Claim Info 76

## 2022-03-20 NOTE — Telephone Encounter (Signed)
Voicemail reminder in reference to patient's 10:30am-03/21/22 telephone appointment. Requested that patient return my call [phone].

## 2022-03-21 ENCOUNTER — Ambulatory Visit
Admission: RE | Admit: 2022-03-21 | Discharge: 2022-03-21 | Disposition: A | Discharge status: Home / Self Care | Payer: Commercial Managed Care - HMO | Source: Ambulatory Visit | Attending: Urology | Admitting: Urology

## 2022-03-21 ENCOUNTER — Encounter: Payer: Self-pay | Admitting: Urology

## 2022-03-21 DIAGNOSIS — C3411 Malignant neoplasm of upper lobe, right bronchus or lung: Secondary | ICD-10-CM

## 2022-03-21 NOTE — Progress Notes (Signed)
  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     Indication for treatment:  Curative, Definitive SBRT       Radiation treatment dates:   01/22/22 - 01/31/22  Site/dose:   The target in the RUL lung was treated to 54 Gy in 3 fractions of 18 Gy  Beams/energy:   The patient was treated using stereotactic body radiotherapy according to a 3D conformal radiotherapy plan.  Volumetric arc fields were employed to deliver 6 MV X-rays.  Image guidance was performed with per fraction cone beam CT prior to treatment under personal MD supervision.  Immobilization was achieved using BodyFix Pillow.  Narrative: The patient tolerated radiation treatment relatively well without any ill side effects.   Plan: The patient has completed radiation treatment. The patient will return to radiation oncology clinic for routine followup in one month. I advised them to call or return sooner if they have any questions or concerns related to their recovery or treatment. ________________________________  Sheral Apley. Tammi Klippel, M.D.

## 2022-03-21 NOTE — Progress Notes (Signed)
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     Interval Since Last Radiation:  7 weeks  01/22/22 - 01/31/22: The target in the RUL lung was treated to 54 Gy in 3 fractions of 18 Gy  Narrative:  The patient returns today for routine follow-up.  She tolerated radiation treatment relatively well without any ill side effects.                               On review of systems, the patient states that she is doing fair in general.  She denies any dysphagia, increased shortness of breath, chest pain or dyspnea.  She does have a chronic cough, producing a clear to whitish sputum but denies any yellow/green coloration or hemoptysis.  She denies any recent fever, chills, night sweats, nausea, vomiting, diarrhea or constipation.  Her biggest concern at this point is that she has been out of work since February 2023 and still awaiting approval for disability which has resulted in an inability to pay her bills, put gas in her car and difficulty putting food on the table.  Otherwise, in regards to her radiation treatment, she is pleased with her progress to date.  ALLERGIES:  is allergic to penicillins, strawberry (diagnostic), and tomato.  Meds: Current Outpatient Medications  Medication Sig Dispense Refill   acetaminophen (TYLENOL) 500 MG tablet Take 1,000 mg by mouth every 6 (six) hours as needed for mild pain.     albuterol (VENTOLIN HFA) 108 (90 Base) MCG/ACT inhaler Inhale 2 puffs into the lungs every 6 (six) hours as needed for wheezing or shortness of breath. 6.7 g 2   amLODipine (NORVASC) 10 MG tablet Take 1 tablet (10 mg total) by mouth daily. 60 tablet 2   aspirin EC 81 MG tablet Take 1 tablet (81 mg total) by mouth  daily. Swallow whole. 30 tablet 11   carvedilol (COREG) 25 MG tablet Take 1 tablet (25 mg total) by mouth 2 (two) times daily with a meal. 60 tablet 0   Continuous Blood Gluc Sensor (DEXCOM G7 SENSOR) MISC Apply each device on your skin every 10 days 3 each 2   gabapentin (NEURONTIN) 300 MG capsule Take 1 capsule (300 mg total) by mouth daily. (Patient taking differently: Take 300 mg by mouth at bedtime.) 30 capsule 0   hydrALAZINE (APRESOLINE) 50 MG tablet Take 1 tablet (50 mg total) by mouth every 8 (eight) hours. 90 tablet 5   hydrOXYzine (ATARAX) 25 MG tablet Take 1 tablet (25 mg total) by mouth at bedtime. 30 tablet 0   insulin isophane & regular human KwikPen (NOVOLIN 70/30 KWIKPEN) (70-30) 100 UNIT/ML KwikPen Inject 15 Units into the skin in the morning AND 5 Units every evening. 9 mL 2   Insulin Pen Needle (UNIFINE PENTIPS) 32G X 4 MM MISC Use in the morning and at bedtime 100 each 3   isosorbide mononitrate (IMDUR) 30 MG 24 hr tablet Take 1 tablet (30 mg total) by mouth daily. 30 tablet 0   nitroGLYCERIN (NITROSTAT) 0.4 MG SL tablet Place 0.4 mg under the tongue every 5 (five) minutes as needed  for chest pain.     oxyCODONE (OXY IR/ROXICODONE) 5 MG immediate release tablet Take 5 mg by mouth every 8 (eight) hours as needed for moderate pain, severe pain or breakthrough pain.     potassium chloride SA (KLOR-CON M) 20 MEQ tablet Take 20 mEq by mouth daily.     rosuvastatin (CRESTOR) 40 MG tablet Take 1 tablet (40 mg total) by mouth daily. 30 tablet 0   torsemide (DEMADEX) 100 MG tablet Take 100 mg by mouth every morning.     Vitamin D, Ergocalciferol, (DRISDOL) 1.25 MG (50000 UNIT) CAPS capsule Take 1 capsule (50,000 Units total) by mouth every 7 (seven) days. 5 capsule 0   No current facility-administered medications for this encounter.    Physical Findings:  vitals were not taken for this visit.  Pain Assessment Pain Score: 0-No pain/10 Unable to assess due to telephone follow-up  visit format.  Lab Findings: Lab Results  Component Value Date   WBC 7.8 03/18/2022   HGB 7.8 (L) 03/18/2022   HCT 24.6 (L) 03/18/2022   MCV 88.5 03/18/2022   PLT 287 03/18/2022     Radiographic Findings: DG CHEST PORT 1 VIEW  Result Date: 03/17/2022 CLINICAL DATA:  Shortness of breath, cough, congestion EXAM: PORTABLE CHEST 1 VIEW COMPARISON:  03/13/2022 FINDINGS: Gross cardiomegaly. Small left pleural effusion no significant right pleural effusion. Mild diffuse interstitial pulmonary opacity. The visualized skeletal structures are unremarkable. IMPRESSION: Gross cardiomegaly with small left pleural effusion and mild diffuse interstitial pulmonary opacity, likely edema. No focal airspace opacity. Electronically Signed   By: Delanna Ahmadi M.D.   On: 03/17/2022 15:59   VAS [place] UPPER EXTREMITY VENOUS DUPLEX  Result Date: 03/17/2022 UPPER VENOUS STUDY  Patient Name:  Amber Cross [facility]  Date of Exam:   03/15/2022 Medical Rec #: [id number]        Accession #:    [id number] Date of Birth: 04-Feb-1961        Patient Gender: F Patient Age:   27 years Exam Location:  [facility] Procedure:      VAS [place] UPPER EXTREMITY VENOUS DUPLEX Referring Phys: DAWOOD ELGERGAWY --------------------------------------------------------------------------------  Indications: Edema Risk Factors: None identified. Comparison Study: No prior studies. Performing Technologist: Oliver Hum RVT  Examination Guidelines: A complete evaluation includes B-mode imaging, spectral Doppler, color Doppler, and power Doppler as needed of all accessible portions of each vessel. Bilateral testing is considered an integral part of a complete examination. Limited examinations for reoccurring indications may be performed as noted.  Right Findings: +----------+------------+---------+-----------+----------+-------+ RIGHT     CompressiblePhasicitySpontaneousPropertiesSummary  +----------+------------+---------+-----------+----------+-------+ IJV           Full       Yes       Yes                      +----------+------------+---------+-----------+----------+-------+ Subclavian    Full       Yes       Yes                      +----------+------------+---------+-----------+----------+-------+ Axillary      Full       Yes       Yes                      +----------+------------+---------+-----------+----------+-------+ Brachial      Full       Yes       Yes                      +----------+------------+---------+-----------+----------+-------+  Radial        Full                                          +----------+------------+---------+-----------+----------+-------+ Ulnar         Full                                          +----------+------------+---------+-----------+----------+-------+ Cephalic      Full                                          +----------+------------+---------+-----------+----------+-------+ Basilic       Full                                          +----------+------------+---------+-----------+----------+-------+  Left Findings: +----------+------------+---------+-----------+----------+-------+ LEFT      CompressiblePhasicitySpontaneousPropertiesSummary +----------+------------+---------+-----------+----------+-------+ IJV           Full       Yes       Yes                      +----------+------------+---------+-----------+----------+-------+ Subclavian    Full       Yes       Yes                      +----------+------------+---------+-----------+----------+-------+ Axillary      Full       Yes       Yes                      +----------+------------+---------+-----------+----------+-------+ Brachial      Full       Yes       Yes                      +----------+------------+---------+-----------+----------+-------+ Radial        Full                                           +----------+------------+---------+-----------+----------+-------+ Ulnar         Full                                          +----------+------------+---------+-----------+----------+-------+ Cephalic      Full                                          +----------+------------+---------+-----------+----------+-------+ Basilic       Full                                          +----------+------------+---------+-----------+----------+-------+  Summary:  Right: No evidence of deep  vein thrombosis in the upper extremity. No evidence of superficial vein thrombosis in the upper extremity.  Left: No evidence of deep vein thrombosis in the upper extremity. No evidence of superficial vein thrombosis in the upper extremity.  *See table(s) above for measurements and observations.  Diagnosing physician: Jamelle Haring Electronically signed by Jamelle Haring on 03/17/2022 at 2:46:01 PM.    Final    NM Pulmonary Perfusion  Result Date: 03/14/2022 CLINICAL DATA:  Concern for thrombus in the main pulmonary artery on echocardiography. EXAM: NUCLEAR MEDICINE PERFUSION LUNG SCAN TECHNIQUE: Perfusion images were obtained in multiple projections after intravenous injection of radiopharmaceutical. RADIOPHARMACEUTICALS:  4.4 mCi Tc-4m MAA IV COMPARISON:  Chest radiograph 03/13/2022 FINDINGS: Normal perfusion to both lungs. No large wedge-shaped or peripheral perfusion abnormalities. IMPRESSION: Normal perfusion in both lungs.  No evidence for pulmonary embolism. Electronically Signed   By: Markus Daft M.D.   On: 03/14/2022 16:57   ECHOCARDIOGRAM LIMITED  Result Date: 03/14/2022    ECHOCARDIOGRAM LIMITED REPORT   Patient Name:   [facility] Date of Exam: 03/14/2022 Medical Rec #:  [id number]       Height:       67.5 in Accession #:    [id number]      Weight:       141.0 lb Date of Birth:  [birth year]       BSA:          1.753 m Patient Age:    15 years        BP:           186/82 mmHg Patient Gender:  F               HR:           84 bpm. Exam Location:  Inpatient Procedure: Limited Echo Indications:    V78.46 Acute systolic (congestive) heart failure  History:        Patient has prior history of Echocardiogram examinations, most                 recent 11/08/2021. CHF; Risk Factors:Hypertension, Diabetes and                 Dyslipidemia.  Sonographer:    Raquel Sarna Senior RDCS Referring Phys: [id number] Darreld Mclean  Sonographer Comments: Exam performed sitting upright for comfort. IMPRESSIONS  1. LV walls have a speckled appearance. Left ventricular ejection fraction, by estimation, is 40 to 45%. Left ventricular ejection fraction by 2D MOD biplane is 44.5 %. The left ventricle has mildly decreased function. The left ventricle demonstrates global hypokinesis. There is severe left ventricular hypertrophy.  2. No definitive tamponade physiology, although the RV appears underfilled. a small pericardial effusion is present. The pericardial effusion is circumferential.  3. The aortic valve is tricuspid. Aortic valve regurgitation is not visualized.  4. Suggestion of possible thrombus in the main pulmonary artery at the PA bifurcation, which appears dilated (image 4). mildly dilated pulmonary artery.  5. The inferior vena cava is normal in size with <50% respiratory variability, suggesting right atrial pressure of 8 mmHg. Comparison(s): Changes from prior study are noted. 11/08/2021: LVEF 55-60%, severe LVH, speckled myocardium. Conclusion(s)/Recommendation(s): Cannot exclude possible thrombus at the PA bifurcation - consider further imaging with V/Q scan. Critical findings reported to Dr. Gardiner Rhyme and acknowledged at 2:43 pm. FINDINGS  Left Ventricle: LV walls have a speckled appearance. Left ventricular ejection fraction, by estimation, is 40 to 45%. Left ventricular ejection fraction by 2D MOD biplane  is 44.5 %. The left ventricle has mildly decreased function. The left ventricle demonstrates global hypokinesis. There  is severe left ventricular hypertrophy. Pericardium: No definitive tamponade physiology, although the RV appears underfilled. A small pericardial effusion is present. The pericardial effusion is circumferential. There is diastolic collapse of the right ventricular free wall and diastolic collapse of the right atrial wall. Aortic Valve: The aortic valve is tricuspid. Aortic valve regurgitation is not visualized. Pulmonary Artery: Suggestion of possible thrombus in the main pulmonary artery at the PA bifurcation, which appears dilated (image 4). The pulmonary artery is mildly dilated. Venous: The inferior vena cava is normal in size with less than 50% respiratory variability, suggesting right atrial pressure of 8 mmHg.  LV Volumes (MOD)               Biplane EF (MOD) LV vol d, MOD    90.1 ml       LV Biplane EF:   Left A2C:                                            ventricular LV vol d, MOD    103.0 ml                       ejection A4C:                                            fraction by LV vol s, MOD    48.4 ml                        2D MOD A2C:                                            biplane is LV vol s, MOD    56.2 ml                        44.5 %. A4C: LV SV MOD A2C:   41.7 ml LV SV MOD A4C:   103.0 ml LV SV MOD BP:    43.0 ml Lyman Bishop MD Electronically signed by Lyman Bishop MD Signature Date/Time: 03/14/2022/2:45:58 PM    Final    US RENAL  Result Date: 03/14/2022 CLINICAL DATA:  61 year old female with acute renal insufficiency. EXAM: RENAL / URINARY TRACT ULTRASOUND COMPLETE COMPARISON:  Renal ultrasound 01/30/2022 and CT Chest, Abdomen, and Pelvis 11/06/2021. FINDINGS: Right Kidney: Renal measurements: 13.4 x 6.2 x 6.6 cm = volume: 289 mL. Echogenic right kidney (image 2), similar to last month. No solid right renal mass or hydronephrosis. Small benign upper pole cyst (no follow-up imaging recommended). Left Kidney: Renal measurements: 12.2 x 7.9 x 6.5 cm = volume: 328 mL. Echogenic left  kidney similar to the last month. No solid left renal mass or hydronephrosis. Bladder: Appears normal for degree of bladder distention. Other: None. IMPRESSION: Chronic medical renal disease. No acute renal finding. Electronically Signed   By: Genevie Ann M.D.   On: 03/14/2022 07:17   DG Chest 2 View  Result Date: 03/13/2022 CLINICAL DATA:  Shortness of  breath EXAM: CHEST - 2 VIEW COMPARISON:  12/18/2021 FINDINGS: Cardiomegaly. Small bilateral pleural effusions. The visualized skeletal structures are unremarkable. IMPRESSION: Cardiomegaly and small bilateral pleural effusions. Electronically Signed   By: Delanna Ahmadi M.D.   On: 03/13/2022 17:08    Impression/Plan: 1. 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    She appears to have recovered well from the effects of her recent SBRT lung treatment and is currently without complaints.  We discussed the plan to obtain a repeat CT chest in the next 1 to 2 weeks to assess her treatment response and if this scan remains stable, we will then proceed with serial CT chest scans every 3 to 6 months for the first year and then every 6 months thereafter until we reach the 5-year, disease-free mark and at that point, we will move to annual, low-dose screening CT chest scans.  We also discussed the increased risk for brain metastasis with small cell lung cancers and that we typically offer prophylactic brain irradiation (PCI) for patient's with small cell lung cancers due to this increased risk but in her case, given the very limited amount of disease, we do not feel that this is necessary. Instead, we would recommend serial MRI brain scans every 3 months for the first 6 months, then every 4 months for 6 months and every 6 months thereafter for a total of 3 years to monitor for any evidence of metastatic disease.  We will coordinate for a new baseline MRI brain scan in the next 1 to 2 weeks and I will follow-up with  her by telephone to review those results.  She was encouraged to ask questions that were answered to her stated satisfaction and she is comfortable and in agreement with the stated plan.    Nicholos Johns, PA-C

## 2022-03-21 NOTE — Progress Notes (Signed)
Telephone appointment. I verified patient's identity and began nursing interview. Patient has a mild cough but not other issues at this time.  Meaningful use complete.  Reminded patient of her 10:30am-03/21/22 telephone appointment w/ Ashlyn Bruning PA-C. I left my extension [phone] in case patient needs anything. Patient verbalized understanding.  Patient contact [phone]

## 2022-03-22 ENCOUNTER — Telehealth: Payer: Self-pay | Admitting: *Deleted

## 2022-03-22 NOTE — Telephone Encounter (Signed)
CALLED PATIENT TO INFORM OF CT FOR 03-28-22- ARRIVAL TIME- 8:30 AM @ [facility], NO RESTRICTIONS TO TEST, MRI TO FOLLOW @ 10 AM ON 03-28-22 @ [facility], PATIENT TO RECEIVE RESULTS FROM ASHLYN BRUNING ON 04-04-22 @ 9 AM VIA TELEPHONE , SPOKE WITH PATIENT AND SHE IS AWARE OF THESE APPTS. AND THE INSTRUCTIONS

## 2022-03-25 ENCOUNTER — Inpatient Hospital Stay: Payer: Commercial Managed Care - HMO | Attending: Radiation Oncology | Admitting: Licensed Clinical Social Worker

## 2022-03-25 NOTE — Progress Notes (Signed)
[place] CSW Progress Note  Clinical Education officer, museum contacted patient by phone to briefly assess.  At the time of phone visit pt was experiencing a coughing fit and the encounter was brief.  Pt referred from radiology due to concerns regarding finances.  Pt resides alone w/ two sons locally; however, both work and are unable to provide support.  Pt has not worked since February. According to pt she filed for disability on her own in February and was denied.  Subsequently pt has a Pmhx significant for a stroke which occurred in April as well as a new dx of CHF.  CSW to leave the signature page for the [facility] at reception for pt 8/29 and will submit on behalf of pt when signed.    Amber Combs, LCSW

## 2022-03-26 ENCOUNTER — Other Ambulatory Visit (HOSPITAL_COMMUNITY): Payer: Self-pay | Admitting: Physician Assistant

## 2022-03-26 ENCOUNTER — Other Ambulatory Visit: Payer: Self-pay | Admitting: Radiology

## 2022-03-26 DIAGNOSIS — R809 Proteinuria, unspecified: Secondary | ICD-10-CM

## 2022-03-27 ENCOUNTER — Ambulatory Visit (HOSPITAL_COMMUNITY)
Admission: RE | Admit: 2022-03-27 | Discharge: 2022-03-27 | Disposition: A | Discharge status: Home / Self Care | Payer: Commercial Managed Care - HMO | Source: Ambulatory Visit | Attending: Nephrology | Admitting: Nephrology

## 2022-03-27 ENCOUNTER — Encounter (HOSPITAL_COMMUNITY): Payer: Self-pay

## 2022-03-28 ENCOUNTER — Ambulatory Visit (HOSPITAL_COMMUNITY): Admission: RE | Admit: 2022-03-28 | Payer: Commercial Managed Care - HMO | Source: Ambulatory Visit

## 2022-03-28 ENCOUNTER — Ambulatory Visit (HOSPITAL_COMMUNITY)
Admission: RE | Admit: 2022-03-28 | Discharge: 2022-03-28 | Disposition: A | Discharge status: Home / Self Care | Payer: Commercial Managed Care - HMO | Source: Ambulatory Visit | Attending: Urology | Admitting: Urology

## 2022-03-28 NOTE — Progress Notes (Signed)
Pt is currently hospitalized. Her MRI order will be placed back into ancillary orders to be RS.

## 2022-04-02 ENCOUNTER — Other Ambulatory Visit: Payer: Self-pay | Admitting: Internal Medicine

## 2022-04-04 ENCOUNTER — Ambulatory Visit: Payer: Self-pay | Admitting: Urology

## 2022-04-15 ENCOUNTER — Telehealth: Payer: Self-pay | Admitting: *Deleted

## 2022-04-15 ENCOUNTER — Ambulatory Visit (HOSPITAL_COMMUNITY): Admission: RE | Admit: 2022-04-15 | Payer: No Typology Code available for payment source | Source: Ambulatory Visit

## 2022-04-15 NOTE — Telephone Encounter (Signed)
Called patient to ask about rescheduling missed CT, patient stated that she didn't show due to her insurance changing, notified Ashlyn Bruning

## 2022-04-16 ENCOUNTER — Telehealth: Payer: Self-pay | Admitting: *Deleted

## 2022-04-16 ENCOUNTER — Ambulatory Visit: Payer: No Typology Code available for payment source | Admitting: Urology

## 2022-04-16 ENCOUNTER — Other Ambulatory Visit: Payer: Self-pay | Admitting: Radiology

## 2022-04-16 ENCOUNTER — Encounter: Payer: Self-pay | Admitting: Pulmonary Disease

## 2022-04-16 ENCOUNTER — Other Ambulatory Visit: Payer: Self-pay | Admitting: Student

## 2022-04-16 ENCOUNTER — Encounter: Payer: Commercial Managed Care - HMO | Admitting: Internal Medicine

## 2022-04-16 NOTE — Telephone Encounter (Signed)
Called patient to inform of MRI for 04-18-22- arrival time- 4 pm @ Radiology @ [facility], no restrictions to test, lvm for a return call

## 2022-04-16 NOTE — Telephone Encounter (Signed)
Velta Addison, Orvis Brill, RN; Darlina Guys; [place], Leory Plowman; Nash Shearer; 2 others  Patient has [employer] as her primary and only insurance. Since we are not in network with her plan, she  may want to find another DME provider.

## 2022-04-17 ENCOUNTER — Telehealth: Payer: Self-pay | Admitting: *Deleted

## 2022-04-17 ENCOUNTER — Ambulatory Visit: Payer: Self-pay | Admitting: Urology

## 2022-04-17 ENCOUNTER — Ambulatory Visit (HOSPITAL_COMMUNITY): Admission: RE | Admit: 2022-04-17 | Payer: No Typology Code available for payment source | Source: Ambulatory Visit

## 2022-04-17 NOTE — Telephone Encounter (Signed)
Called patient to inform of MRI for 04-18-22- arrival time- 4 pm @ Radiology @ [facility] no restrictions to test, patient to receive results from [facility] on 04-19-22 @ 1 pm via telephone, spoke with patient and she is aware of these appts. and the instructions

## 2022-04-18 ENCOUNTER — Encounter: Payer: Self-pay | Admitting: Urology

## 2022-04-18 ENCOUNTER — Ambulatory Visit (HOSPITAL_BASED_OUTPATIENT_CLINIC_OR_DEPARTMENT_OTHER)
Admission: RE | Admit: 2022-04-18 | Discharge: 2022-04-18 | Disposition: A | Discharge status: Home / Self Care | Payer: No Typology Code available for payment source | Source: Ambulatory Visit | Attending: Urology | Admitting: Urology

## 2022-04-18 DIAGNOSIS — C3411 Malignant neoplasm of upper lobe, right bronchus or lung: Secondary | ICD-10-CM | POA: Diagnosis present
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
## 2022-04-18 NOTE — Progress Notes (Signed)
Telephone appointment. I spoke w/ patient's son Mr. Latondra Gebhart, verified his identity and began nursing interview. He reports that patient is having chest pain 7/10, w/ some SOB that worsens w/ exertion. Patient is currently on a minimum of 2 liters of oxygen. I reviewed the ER precautions in reference to patient's chest pain. No other issues reported at this time.  Meaningful use complete.  Patient is aware of 1:00pm-04/19/22 telephone appointment w/ Ashlyn Bruning PA-C. I left my extension [phone] in case patient needs anything. Mr. Shroff verbalized understanding.  Patient contact [phone] or Son Mr. Keyleigh Manninen [phone]

## 2022-04-19 ENCOUNTER — Ambulatory Visit
Admission: RE | Admit: 2022-04-19 | Discharge: 2022-04-19 | Disposition: A | Discharge status: Home / Self Care | Payer: No Typology Code available for payment source | Source: Ambulatory Visit | Attending: Radiation Oncology | Admitting: Radiation Oncology

## 2022-04-19 DIAGNOSIS — C3411 Malignant neoplasm of upper lobe, right bronchus or lung: Secondary | ICD-10-CM | POA: Insufficient documentation

## 2022-04-22 ENCOUNTER — Encounter: Payer: Self-pay | Admitting: Licensed Clinical Social Worker

## 2022-04-22 ENCOUNTER — Telehealth: Payer: Self-pay | Admitting: *Deleted

## 2022-04-22 ENCOUNTER — Telehealth: Payer: Self-pay

## 2022-04-22 DIAGNOSIS — C3411 Malignant neoplasm of upper lobe, right bronchus or lung: Secondary | ICD-10-CM

## 2022-04-22 NOTE — Telephone Encounter (Signed)
CALLED PATIENT TO INFORM OF CT FOR 06-28-22- ARRIVAL TIME- 12 PM @ [facility], NO RESTRICTIONS TO TEST, PATIENT TO RECEIVE CT RESULTS FROM ASHLYN BRUNING VIA TELEPHONE ON 07-03-22 @ 10:30 AM,  LVM FOR A RETURN CALL

## 2022-04-22 NOTE — Telephone Encounter (Signed)
Patient notified of recent MRI results/ impression from 04/18/22.   IMPRESSION: MRI results 04/18/22. 1. Stable appearance of remote subcortical white matter lacunar infarct. 2. No new lesions to suggest metastatic disease to the brain. 3. Stable atrophy and white matter disease. This likely reflects the sequela of chronic microvascular ischemia. 4. Bilateral mastoid effusions. No obstructing nasopharyngeal lesion is present.  Patient has verbalized understanding. I left my extension [phone] in case patient needs anything.

## 2022-04-22 NOTE — Progress Notes (Signed)
[place] Progress Note  Clinical Education officer, museum contacted patient by phone to discuss [facility] referral.  Pt unable to make it to [facility] at this time and does not have a way to print Museum/gallery conservator.  [place] put [place] page in the mail for pt to sign.  Once received and signed pt will have her son assist w/ emailing to CSW in order to start application for disability.  CSW to continue to follow as appropriate throughout duration of treatment.      Henriette Combs, LCSW

## 2022-04-30 ENCOUNTER — Inpatient Hospital Stay
Payer: No Typology Code available for payment source | Attending: Radiation Oncology | Admitting: Licensed Clinical Social Worker

## 2022-04-30 DIAGNOSIS — R339 Retention of urine, unspecified: Secondary | ICD-10-CM | POA: Insufficient documentation

## 2022-04-30 DIAGNOSIS — C3411 Malignant neoplasm of upper lobe, right bronchus or lung: Secondary | ICD-10-CM

## 2022-04-30 NOTE — Progress Notes (Signed)
[place] CSW Progress Note  Clinical Education officer, museum  received a call from patient regarding submitting a referral to the [employer] to file for disability.  Pt states she had reached out to a lawyer to file for disability; however, pt does not believe anything was ever filed.    Pt stopped by reception to sign a referral for the [facility].  Referral completed and submitted by CSW.  CSW to remain available as appropriate throughout duration of treatment.      Henriette Combs, LCSW

## 2022-05-01 ENCOUNTER — Other Ambulatory Visit: Payer: Self-pay | Admitting: Radiation Therapy

## 2022-05-01 ENCOUNTER — Telehealth: Payer: Self-pay

## 2022-05-01 DIAGNOSIS — C3490 Malignant neoplasm of unspecified part of unspecified bronchus or lung: Secondary | ICD-10-CM

## 2022-05-01 NOTE — Telephone Encounter (Signed)
Spoke with son Freida Busman patient has been admitted to the hospital.

## 2022-05-01 NOTE — Telephone Encounter (Signed)
Tried to call patient to schedule a lab only appointment at her earliest convince unable to reach patient by phone number on file will continue to try to reach her.

## 2022-05-01 NOTE — Telephone Encounter (Signed)
Amber Cross, the patient that I saw 10/3 that we were wanting to get lab redraw on is a different patient of similar name.

## 2022-05-02 ENCOUNTER — Telehealth: Payer: Self-pay | Admitting: Radiation Therapy

## 2022-05-02 ENCOUNTER — Other Ambulatory Visit: Payer: Self-pay | Admitting: Radiation Therapy

## 2022-05-02 NOTE — Telephone Encounter (Signed)
Sorry about that, I will give the other patient a call.

## 2022-05-02 NOTE — Telephone Encounter (Signed)
Spoke with Ms. Amber Cross about her upcoming chest CT and brain MRI to be completed at [facility] on 11/30. She is also aware of the telephone follow-up with Ashlyn the following week to review those results.  Mont Dutton R.T.(R)(T) Radiation Special Procedures Navigator

## 2022-05-03 ENCOUNTER — Telehealth: Payer: Self-pay | Admitting: *Deleted

## 2022-05-03 NOTE — Telephone Encounter (Signed)
Call from [facility] - Multimedia programmer with Amber Cross. Stated pt was recently discharged from the hospital.with PT referral  Requesting verbal order to add Nursing for education and decrease re-admission  VO given - sending to PCP for approval. Thanks

## 2022-05-06 ENCOUNTER — Telehealth: Payer: Self-pay | Admitting: *Deleted

## 2022-05-06 NOTE — Telephone Encounter (Signed)
Call from [place], [place] from [facility].  Recently discharge patient 05/02/2022 from hospital.  Need orders for Nursing visits 1 time a week for 5 weeks.  Order to change Foley Cathter every 5 weeks.  Going out to do  [place] can be reached at [phone].CHF and Diabetes Teaching.

## 2022-05-09 NOTE — Telephone Encounter (Signed)
Done

## 2022-05-09 NOTE — Telephone Encounter (Signed)
Amber Cross with [facility] requesting VO. Please call back @ [phone]

## 2022-05-10 ENCOUNTER — Telehealth: Payer: Self-pay | Admitting: *Deleted

## 2022-05-10 NOTE — Telephone Encounter (Signed)
Spoke with patient regarding her missed appointment / she will call back due to no transportation.

## 2022-05-10 NOTE — Progress Notes (Deleted)
CC: Hospital f/u visit.   HPI:  [name] is a 61 y.o. 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    Allergies as of 05/10/2022       Reactions   Penicillins Itching, Other (See Comments)   redness   Strawberry (diagnostic) Itching, Swelling   Tomato Itching, Swelling        Medication List        Accurate as of May 10, 2022  7:27 AM. If you have any questions, ask your nurse or doctor.          acetaminophen 500 MG tablet Commonly known as: TYLENOL Take 1,000 mg by mouth every 6 (six) hours as needed for mild pain.   albuterol 108 (90 Base) MCG/ACT inhaler Commonly known as: VENTOLIN HFA Inhale 2 puffs into the lungs every 6 (six) hours as needed for wheezing or shortness of breath.   amLODipine 10 MG tablet Commonly known as: NORVASC Take 1 tablet (10 mg total) by mouth daily.   aspirin EC 81 MG tablet Take 1 tablet (81 mg total) by mouth daily. Swallow whole.   carvedilol 25 MG tablet Commonly known as: COREG Take 1 tablet (25 mg total) by mouth 2 (two) times daily with a meal.   Dexcom G7 Sensor Misc Apply each device on your skin every 10 days   gabapentin 300 MG capsule Commonly known as: Neurontin Take 1 capsule (300 mg total) by mouth daily. What changed: when to take this   hydrALAZINE 50 MG tablet Commonly known as: APRESOLINE Take 1 tablet (50 mg total) by mouth every 8 (eight) hours.   hydrOXYzine 25 MG tablet Commonly known as: ATARAX Take 1 tablet (25 mg total) by mouth at bedtime.   isosorbide mononitrate 30 MG 24 hr tablet Commonly known as: IMDUR Take 1 tablet (30 mg total) by mouth daily.   nitroGLYCERIN 0.4 MG SL tablet Commonly known as: NITROSTAT Place 0.4 mg under the tongue every 5 (five) minutes as needed for chest pain.   NovoLIN 70/30 Kwikpen (70-30) 100 UNIT/ML  KwikPen Generic drug: insulin isophane & regular human KwikPen Inject 15 Units into the skin in the morning AND 5 Units every evening. What changed: See the new instructions.   potassium chloride SA 20 MEQ tablet Commonly known as: KLOR-CON M Take 20 mEq by mouth daily.   rosuvastatin 20 MG tablet Commonly known as: CRESTOR Take 10 mg by mouth daily.   rosuvastatin 40 MG tablet Commonly known as: CRESTOR Take 1 tablet (40 mg total) by mouth daily.   torsemide 100 MG tablet Commonly known as: DEMADEX Take 100 mg by mouth every morning.   Unifine Pentips 32G X 4 MM Misc Generic drug: Insulin Pen Needle Use in the morning and at bedtime   Vitamin D (Ergocalciferol) 1.25 MG (50000 UNIT) Caps capsule Commonly known as: DRISDOL Take 1 capsule (50,000 Units total) by mouth every 7 (seven) days.         Past Medical History:  Diagnosis Date   Anemia    Arthritis    CHF (congestive heart failure) ([place])    Chronic kidney disease    Depression    Diabetes mellitus    Headache    Hypercholesteremia    Hypertension    Pseudogout    Stroke ([place]) 10/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   Tobacco  abuse    Review of Systems:  per HPI.   Physical Exam: *** There were no vitals filed for this visit.  *** Constitutional: Well-developed, well-nourished, appears comfortable  HENT: Normocephalic and atraumatic.  Eyes: EOM are normal. PERRL.  Neck: Normal range of motion.  Cardiovascular: Regular rate, regular rhythm. No murmurs, rubs, or gallops. Normal radial and PT pulses bilaterally. No LE edema.  Pulmonary: Normal respiratory effort. No wheezes, rales, or rhonchi.   Abdominal: Soft. Non-distended. No tenderness. Normal bowel sounds.  Musculoskeletal: Normal range of motion.     Neurological: Alert and oriented to person, place, and time. Non-focal. Skin: warm and dry.    Assessment & Plan:   ?: ***  Nephrotic syndrome - Patient was initially discharged on 8/21 after being  admitted for acute hypoxic respiratory failure secondary to volume overload in the setting of nephrotic syndrome. Patient was scheduled for an inpatient renal biopsy but she did not want to stay in the hospital for the extra 3-5 days for aspirin washout. Patient was discharged from [facility] for a similar presentation on 10/5. Patient was also found to have urinary retention. She was sent home with a foley catheter and f/u with urology scheduled. Patient has *** been seen nephrology since her discharge. Most recent creatinine is 5.17 w/ GFR of 9 (8 days ago). Renal biopsy rescheduled for 10/17. Patient is currently taking torsemide 100 MG and states that she is *** compliant with this medication. Patient denies SOB or LE swelling. Weight today ***. No signs of respiratory distress or hypoxia today.   Plan: - F/u with nephrology following biopsy for repeat BMP - Continue torsemide 100 MG   2. HTN/HFREF - Most recent echo in 02/2022 demonstrates EF 40-45% w/ global hypokinesis of the LV. Current medications include amLODipine 10 MG, carvedilol 25 MG BID, hydrALAZINE 50 MG TID, isosorbide mononitrate 30 MG, and torsemide 100 MG. Patient states that she is *** compliant with these medications. Patient states that she does *** check her BP regularly at home. Patient denies HA, lightheadedness, dizziness, CP, or SOB. Initial BP today is ***. Repeat BP is ***.    Plan: - Continue amLODipine 10 MG, carvedilol 25 MG BID, hydrALAZINE 50 MG TID, isosorbide mononitrate 30 MG, and torsemide 100 MG   3. T2DM - Current medications include NovoLIN 70/30 (15 units in the morning, 5 units in the evening) and gabapentin 300 MG. Patient states that he is *** compliant with these medications. Patient does *** check his blood sugar at home and notes values ***. Patient denies polyuria, polydipsia, fatigue. A1c was 7.4 in 04/2022. Patient states that he does *** visit the ophthalmologist for yearly eye exams. Foot exam  today ***.   Plan: - Continue NovoLIN 70/30 (15 units in the morning, 5 units in the evening) and gabapentin 300 MG  4. Health Screening: - Influenza vaccine (), mammogram (), colonoscopy () - Medication refill?  Plan: -   See Encounters Tab for problem based charting.  Patient seen with Dr. Barbaraann Boys

## 2022-05-13 ENCOUNTER — Telehealth: Payer: Self-pay

## 2022-05-13 ENCOUNTER — Other Ambulatory Visit: Payer: Self-pay | Admitting: Student

## 2022-05-13 NOTE — Telephone Encounter (Addendum)
Call from [place], [place] from [facility] needs verbal order for PT 2 times a week x 3 weeks .  B/P 174/80 today has not taken B/P med today or yesterday. Roselie Awkward can be reached [phone].

## 2022-05-13 NOTE — Telephone Encounter (Signed)
Physical therapy  is requesting a call back about pt  name Amber Cross with [facility] [phone]  orders needed

## 2022-05-14 ENCOUNTER — Other Ambulatory Visit: Payer: Self-pay

## 2022-05-14 ENCOUNTER — Ambulatory Visit (HOSPITAL_COMMUNITY)
Admission: RE | Admit: 2022-05-14 | Discharge: 2022-05-14 | Disposition: A | Discharge status: Home / Self Care | Payer: Commercial Managed Care - HMO | Source: Ambulatory Visit | Attending: Nephrology | Admitting: Nephrology

## 2022-05-14 ENCOUNTER — Encounter (HOSPITAL_COMMUNITY): Payer: Self-pay

## 2022-05-14 DIAGNOSIS — R319 Hematuria, unspecified: Secondary | ICD-10-CM | POA: Diagnosis not present

## 2022-05-14 DIAGNOSIS — R809 Proteinuria, unspecified: Secondary | ICD-10-CM | POA: Insufficient documentation

## 2022-05-14 DIAGNOSIS — E1129 Type 2 diabetes mellitus with other diabetic kidney complication: Secondary | ICD-10-CM | POA: Diagnosis not present

## 2022-05-14 DIAGNOSIS — N184 Chronic kidney disease, stage 4 (severe): Secondary | ICD-10-CM | POA: Diagnosis not present

## 2022-05-14 DIAGNOSIS — R601 Generalized edema: Secondary | ICD-10-CM | POA: Insufficient documentation

## 2022-05-14 LAB — PROTIME-INR
INR: 1.1 (ref 0.8–1.2)
Prothrombin Time: 13.7 seconds (ref 11.4–15.2)

## 2022-05-14 LAB — GLUCOSE, CAPILLARY: Glucose-Capillary: 135 mg/dL — ABNORMAL HIGH (ref 70–99)

## 2022-05-14 LAB — CBC
HCT: 25.4 % — ABNORMAL LOW (ref 36.0–46.0)
Hemoglobin: 8 g/dL — ABNORMAL LOW (ref 12.0–15.0)
MCH: 27.4 pg (ref 26.0–34.0)
MCHC: 31.5 g/dL (ref 30.0–36.0)
MCV: 87 fL (ref 80.0–100.0)
Platelets: 386 10*3/uL (ref 150–400)
RBC: 2.92 MIL/uL — ABNORMAL LOW (ref 3.87–5.11)
RDW: 15.9 % — ABNORMAL HIGH (ref 11.5–15.5)
WBC: 10.5 10*3/uL (ref 4.0–10.5)
nRBC: 0 % (ref 0.0–0.2)

## 2022-05-14 MED ORDER — MIDAZOLAM HCL 2 MG/2ML IJ SOLN
INTRAMUSCULAR | Status: AC
  Filled 2022-05-14: qty 2

## 2022-05-14 MED ORDER — FENTANYL CITRATE (PF) 100 MCG/2ML IJ SOLN
INTRAMUSCULAR | Status: AC
  Filled 2022-05-14: qty 2

## 2022-05-14 MED ORDER — MIDAZOLAM HCL 2 MG/2ML IJ SOLN
INTRAMUSCULAR | Status: AC | PRN
  Administered 2022-05-14: 1 mg via INTRAVENOUS

## 2022-05-14 MED ORDER — LIDOCAINE HCL 1 % IJ SOLN
INTRAMUSCULAR | Status: AC | PRN
  Administered 2022-05-14: 10 mL

## 2022-05-14 MED ORDER — HYDRALAZINE HCL 20 MG/ML IJ SOLN
INTRAMUSCULAR | Status: AC | PRN
  Administered 2022-05-14 (×2): 5 mg via INTRAVENOUS
  Administered 2022-05-14: 10 mg via INTRAVENOUS

## 2022-05-14 MED ORDER — SODIUM CHLORIDE 0.9 % IV SOLN: INTRAVENOUS | Status: DC

## 2022-05-14 MED ORDER — HYDRALAZINE HCL 20 MG/ML IJ SOLN
INTRAMUSCULAR | Status: AC
  Filled 2022-05-14: qty 1

## 2022-05-14 MED ORDER — FENTANYL CITRATE (PF) 100 MCG/2ML IJ SOLN
INTRAMUSCULAR | Status: AC | PRN
  Administered 2022-05-14: 50 ug via INTRAVENOUS

## 2022-05-14 MED ORDER — LIDOCAINE HCL (PF) 1 % IJ SOLN
INTRAMUSCULAR | Status: AC
  Filled 2022-05-14: qty 30

## 2022-05-14 MED ORDER — GELATIN ABSORBABLE 12-7 MM EX MISC
CUTANEOUS | Status: AC
  Filled 2022-05-14: qty 1

## 2022-05-14 NOTE — Progress Notes (Signed)
Pts friend Otila Kluver notified the pt has returned to post-op and to make her way to volunteer desk to be directed back to her.

## 2022-05-14 NOTE — H&P (Signed)
Chief Complaint: Patient was seen in consultation today for random renal biopsy at the request of Sanford,Ryan B  Referring Physician(s): Sanford,Ryan B  Supervising Physician: Daryll Brod  Patient Status: [facility] - Out-pt  History of Present Illness: Amber Cross is a 61 y.o. female   Progressive renal insufficiency and follows with Dr Joelyn Oms Type 2 diabetes; HTN Nephrotic proteinuria Hematuria Anasarca  Pt has foley catheter in place after recent hospitalization for "fluid overload" To follow up Thursday for possible removal  Dr Joelyn Oms ordering random renal biopsy and scheduled for today   Past Medical History:  Diagnosis Date   Anemia    Arthritis    CHF (congestive heart failure) ([place])    Chronic kidney disease    Depression    Diabetes mellitus    Headache    Hypercholesteremia    Hypertension    Pseudogout    Stroke ([place]) 10/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   Tobacco abuse     Past Surgical History:  Procedure Laterality Date   ANTERIOR CRUCIATE LIGAMENT REPAIR Right    BRONCHIAL BIOPSY  12/18/2021   Procedure: BRONCHIAL BIOPSIES;  Surgeon: Garner Nash, DO;  Location: [place];  Service: Pulmonary;;   BRONCHIAL BRUSHINGS  12/18/2021   Procedure: BRONCHIAL BRUSHINGS;  Surgeon: Garner Nash, DO;  Location: [place];  Service: Pulmonary;;   BRONCHIAL NEEDLE ASPIRATION BIOPSY  12/18/2021   Procedure: BRONCHIAL NEEDLE ASPIRATION BIOPSIES;  Surgeon: Garner Nash, DO;  Location: [place];  Service: Pulmonary;;   CATARACT EXTRACTION     FIDUCIAL MARKER PLACEMENT  12/18/2021   Procedure: FIDUCIAL MARKER PLACEMENT;  Surgeon: Garner Nash, DO;  Location: [place];  Service: Pulmonary;;   OVARY SURGERY     RIGHT OOPHORECTOMY Right    SMALL INTESTINE SURGERY     TUBAL LIGATION     VIDEO BRONCHOSCOPY WITH RADIAL ENDOBRONCHIAL ULTRASOUND  12/18/2021   Procedure: RADIAL ENDOBRONCHIAL ULTRASOUND;  Surgeon: Garner Nash, DO;  Location: [place];  Service: Pulmonary;;    Allergies: Penicillins, Strawberry (diagnostic), and Tomato  Medications: Prior to Admission medications   Medication Sig Start Date End Date Taking? Authorizing Provider  acetaminophen (TYLENOL) 500 MG tablet Take 1,000 mg by mouth every 6 (six) hours as needed for mild pain.   Yes [provider]  albuterol (VENTOLIN HFA) 108 (90 Base) MCG/ACT inhaler Inhale 2 puffs into the lungs every 6 (six) hours as needed for wheezing or shortness of breath. 02/28/22  Yes Farrel Gordon, DO  amLODipine (NORVASC) 10 MG tablet Take 1 tablet (10 mg total) by mouth daily. 02/04/22  Yes Lacinda Axon, MD  carvedilol (COREG) 25 MG tablet Take 1 tablet (25 mg total) by mouth 2 (two) times daily with a meal. Patient taking differently: Take 50 mg by mouth 2 (two) times daily with a meal. 03/18/22 05/10/22 Yes Pahwani, Einar Grad, MD  gabapentin (NEURONTIN) 300 MG capsule Take 1 capsule (300 mg total) by mouth daily. Patient taking differently: Take 300 mg by mouth at bedtime. 11/23/21 05/10/22 Yes Rosezetta Schlatter, MD  hydrALAZINE (APRESOLINE) 50 MG tablet Take 1 tablet (50 mg total) by mouth every 8 (eight) hours. Patient taking differently: Take 100 mg by mouth every 8 (eight) hours. 02/03/22  Yes Amponsah, Charisse March, MD  insulin isophane & regular human KwikPen (NOVOLIN 70/30 KWIKPEN) (70-30) 100 UNIT/ML KwikPen Inject 15 Units into the skin in the morning AND 5 Units every evening. Patient taking differently: Inject 5 mg twice  a day 03/19/22  Yes [name], DO  isosorbide mononitrate (IMDUR) 30 MG 24 hr tablet Take 1 tablet (30 mg total) by mouth daily. 03/19/22 05/10/22 Yes Pahwani, Einar Grad, MD  torsemide (DEMADEX) 20 MG tablet Take 40 mg by mouth 2 (two) times daily. 02/28/22  Yes [provider]  traZODone (DESYREL) 50 MG tablet Take 50 mg by mouth at bedtime. 03/30/22  Yes [provider]  aspirin EC 81 MG tablet Take 1 tablet (81 mg total) by mouth daily.  Swallow whole. 02/28/22   Farrel Gordon, DO  Continuous Blood Gluc Sensor (DEXCOM G7 SENSOR) MISC Apply each device on your skin every 10 days 03/19/22   [name], DO  hydrOXYzine (ATARAX) 25 MG tablet Take 1 tablet (25 mg total) by mouth at bedtime. Patient not taking: Reported on 05/10/2022 02/03/22   Lacinda Axon, MD  Insulin Pen Needle (UNIFINE PENTIPS) 32G X 4 MM MISC Use in the morning and at bedtime 12/11/21 12/06/22  Iona Beard, MD  nitroGLYCERIN (NITROSTAT) 0.4 MG SL tablet Place 0.4 mg under the tongue every 5 (five) minutes as needed for chest pain. 10/01/21   [provider]  rosuvastatin (CRESTOR) 40 MG tablet Take 1 tablet (40 mg total) by mouth daily. Patient not taking: Reported on 03/22/2022 11/24/21   Rosezetta Schlatter, MD  Vitamin D, Ergocalciferol, (DRISDOL) 1.25 MG (50000 UNIT) CAPS capsule Take 1 capsule (50,000 Units total) by mouth every 7 (seven) days. Patient not taking: Reported on 05/10/2022 02/07/22   Lacinda Axon, MD     Family History  Problem Relation Age of Onset   Diabetes Mother    Hypertension Mother    Hypertension Father     Social History   Socioeconomic History   Marital status: Married    Spouse name: Not on file   Number of children: Not on file   Years of education: Not on file   Highest education level: Not on file  Occupational History   Not on file  Tobacco Use   Smoking status: Every Day    Packs/day: 0.50    Years: 40.00    Total pack years: 20.00    Types: Cigarettes   Smokeless tobacco: Never  Vaping Use   Vaping Use: Never used  Substance and Sexual Activity   Alcohol use: Not Currently   Drug use: Yes    Frequency: 4.0 times per week    Types: Marijuana    Comment: smokes every other day   Sexual activity: Not Currently    Birth control/protection: Abstinence  Other Topics Concern   Not on file  Social History Narrative   Not on file   Social Determinants of Health   Financial Resource Strain:  Medium Risk (02/28/2022)   Overall Financial Resource Strain (CARDIA)    Difficulty of Paying Living Expenses: Somewhat hard  Food Insecurity: Not on file  Transportation Needs: Unmet Transportation Needs (02/28/2022)   PRAPARE - Transportation    Lack of Transportation (Medical): No    Lack of Transportation (Non-Medical): Yes  Physical Activity: Not on file  Stress: Stress Concern Present (02/28/2022)   [place]    Feeling of Stress : To some extent  Social Connections: Not on file    Review of Systems: A 12 point ROS discussed and pertinent positives are indicated in the HPI above.  All other systems are negative.  Review of Systems  Constitutional:  Positive for activity change and  fatigue. Negative for diaphoresis and fever.  Respiratory:  Negative for cough and shortness of breath.   Cardiovascular:  Positive for leg swelling. Negative for chest pain.  Gastrointestinal:  Negative for abdominal pain, diarrhea, nausea and vomiting.  Musculoskeletal:  Negative for back pain.  Neurological:  Positive for weakness.  Psychiatric/Behavioral:  Negative for behavioral problems and confusion.     Vital Signs: BP (!) 182/76   Pulse 75   Temp 97.8 F (36.6 C) (Temporal)   Resp 16   Ht 5' 7.5" (1.715 m)   Wt 146 lb (66.2 kg)   LMP 09/29/2011   SpO2 95%   BMI 22.53 kg/m     Physical Exam Vitals reviewed.  HENT:     Mouth/Throat:     Mouth: Mucous membranes are moist.  Cardiovascular:     Rate and Rhythm: Normal rate and regular rhythm.     Heart sounds: Normal heart sounds.  Pulmonary:     Effort: Pulmonary effort is normal. No respiratory distress.     Breath sounds: Normal breath sounds. No wheezing.  Abdominal:     Palpations: Abdomen is soft.     Tenderness: There is no abdominal tenderness.  Musculoskeletal:        General: Swelling present. Normal range of motion.     Right lower leg: Edema present.      Left lower leg: Edema present.  Skin:    General: Skin is warm.  Neurological:     Mental Status: She is alert and oriented to person, place, and time.  Psychiatric:        Behavior: Behavior normal.     Imaging: MR Brain Wo Contrast  Result Date: 04/19/2022 CLINICAL DATA:  Small cell lung cancer. Previous white matter lacunar infarct. EXAM: MRI HEAD WITHOUT CONTRAST TECHNIQUE: Multiplanar, multiecho pulse sequences of the brain and surrounding structures were obtained without intravenous contrast. COMPARISON:  MRI of the head without contrast 01/29/2022. FINDINGS: Brain: The previously described remote subcortical white matter lacunar infarct is stable. Other scattered subcortical T2 hyperintensities are stable as well. No new lesions are present to suggest metastatic disease to the brain. Contrast was not administered. The ventricles are of normal size. No significant extraaxial fluid collection is present. White matter changes extending into the brainstem are stable. Cerebellum is normal bilaterally. Vascular: Flow is present in the major intracranial arteries. Skull and upper cervical spine: The craniocervical junction is normal. Upper cervical spine is within normal limits. Marrow signal is unremarkable. Sinuses/Orbits: Bilateral mastoid effusions are present. No obstructing nasopharyngeal lesion is present. The paranasal sinuses and mastoid air cells are otherwise clear. Bilateral lens replacements are noted. Globes and orbits are otherwise unremarkable. IMPRESSION: 1. Stable appearance of remote subcortical white matter lacunar infarct. 2. No new lesions to suggest metastatic disease to the brain. 3. Stable atrophy and white matter disease. This likely reflects the sequela of chronic microvascular ischemia. 4. Bilateral mastoid effusions. No obstructing nasopharyngeal lesion is present. Electronically Signed   By: San Morelle M.D.   On: 04/19/2022 13:25    Labs:  CBC: Recent  Labs    03/13/22 1713 03/15/22 0558 03/18/22 0545 05/14/22 0637  WBC 8.1 7.4 7.8 10.5  HGB 8.5* 8.4* 7.8* 8.0*  HCT 26.4* 26.2* 24.6* 25.4*  PLT 342 340 287 386    COAGS: Recent Labs    03/18/22 0545 05/14/22 0637  INR 0.9 1.1    BMP: Recent Labs    03/15/22 0558 03/16/22 0549 03/17/22 0547 03/18/22  0545  NA 142 139 140 140  K 3.2* 3.1* 3.4* 3.8  CL 113* 110 114* 114*  CO2 21* 22 22 21*  GLUCOSE 139* 109* 116* 125*  BUN 34* 34* 35* 34*  CALCIUM 8.0* 7.8* 8.0* 7.9*  CREATININE 3.39* 3.20* 3.26* 3.43*  GFRNONAA 15* 16* 16* 15*    LIVER FUNCTION TESTS: Recent Labs    11/23/21 0736 01/27/22 1620 01/31/22 0156 02/02/22 0046 02/03/22 0251 03/13/22 2219 03/15/22 0558  BILITOT 0.3 0.4 0.3  --   --  0.3  --   AST 13* 16 13*  --   --  14*  --   ALT 12 11 12   --   --  15  --   ALKPHOS 78 108 118  --   --  142*  --   PROT 4.6* 5.4* 5.4*  --   --  5.5*  --   ALBUMIN <1.5* 1.8* 1.6* 1.6* 1.8* 2.1* 2.2*    TUMOR MARKERS: No results for input(s): "AFPTM", "CEA", "CA199", "CHROMGRNA" in the last 8760 hours.  Assessment and Plan:  Progressive renal insufficiency Anasarca Proteinuria and hematuria HTN Scheduled for random renal biopsy per Nephrology Risks and benefits of random renal biopsy was discussed with the patient and/or patient's family including, but not limited to bleeding, infection, damage to adjacent structures or low yield requiring additional tests.  All of the questions were answered and there is agreement to proceed.  Consent signed and in chart.  Thank you for this interesting consult.  I greatly enjoyed meeting Alicyn Klann and look forward to participating in their care.  A copy of this report was sent to the requesting provider on this date.  Electronically Signed: Lavonia Drafts, PA-C 05/14/2022, 7:12 AM   I spent a total of 30 minutes    in face to face in clinical consultation, greater than 50% of which was  counseling/coordinating care for random renal biopsy

## 2022-05-14 NOTE — Procedures (Signed)
Interventional Radiology Procedure Note  Procedure: US RENAL CORE BX    Complications: None  Estimated Blood Loss:  0  Findings: 29 G CORE X 2    M. Daryll Brod, MD

## 2022-05-17 ENCOUNTER — Telehealth: Payer: Self-pay | Admitting: *Deleted

## 2022-05-17 NOTE — Telephone Encounter (Signed)
Call from PT [facility]-Patient's B/P today was 180 90.  Asymptomatic. Clarification on B/P dosing needed.  1(t) swelling in leg.  Has had Diarrhea which patient says is normal.  Has had a hectic week.  Kidney biopsy done on yesterday.  Just feels overwhelmed and tired today.  The [facility] Nurse will also be going out today to visit patient.  Roselie Awkward can be reached at [phone].

## 2022-05-23 ENCOUNTER — Encounter (HOSPITAL_COMMUNITY): Payer: Self-pay

## 2022-05-23 LAB — SURGICAL PATHOLOGY

## 2022-06-16 ENCOUNTER — Encounter (HOSPITAL_COMMUNITY): Payer: Self-pay

## 2022-06-16 ENCOUNTER — Inpatient Hospital Stay (HOSPITAL_BASED_OUTPATIENT_CLINIC_OR_DEPARTMENT_OTHER)
Admission: EM | Admit: 2022-06-16 | Discharge: 2022-07-02 | DRG: 270 | Disposition: A | Discharge status: Home / Self Care | Payer: Commercial Managed Care - HMO | Attending: Internal Medicine | Admitting: Internal Medicine

## 2022-06-16 ENCOUNTER — Other Ambulatory Visit: Payer: Self-pay

## 2022-06-16 ENCOUNTER — Emergency Department (HOSPITAL_BASED_OUTPATIENT_CLINIC_OR_DEPARTMENT_OTHER): Payer: Commercial Managed Care - HMO

## 2022-06-16 ENCOUNTER — Encounter (HOSPITAL_BASED_OUTPATIENT_CLINIC_OR_DEPARTMENT_OTHER): Payer: Self-pay | Admitting: Emergency Medicine

## 2022-06-16 DIAGNOSIS — I3139 Other pericardial effusion (noninflammatory): Secondary | ICD-10-CM | POA: Diagnosis present

## 2022-06-16 DIAGNOSIS — R34 Anuria and oliguria: Secondary | ICD-10-CM | POA: Diagnosis not present

## 2022-06-16 DIAGNOSIS — Z9981 Dependence on supplemental oxygen: Secondary | ICD-10-CM

## 2022-06-16 DIAGNOSIS — N170 Acute kidney failure with tubular necrosis: Secondary | ICD-10-CM | POA: Diagnosis present

## 2022-06-16 DIAGNOSIS — Z79899 Other long term (current) drug therapy: Secondary | ICD-10-CM

## 2022-06-16 DIAGNOSIS — E7800 Pure hypercholesterolemia, unspecified: Secondary | ICD-10-CM | POA: Diagnosis present

## 2022-06-16 DIAGNOSIS — D72829 Elevated white blood cell count, unspecified: Secondary | ICD-10-CM | POA: Diagnosis not present

## 2022-06-16 DIAGNOSIS — Z992 Dependence on renal dialysis: Secondary | ICD-10-CM

## 2022-06-16 DIAGNOSIS — Z1152 Encounter for screening for COVID-19: Secondary | ICD-10-CM | POA: Diagnosis not present

## 2022-06-16 DIAGNOSIS — D631 Anemia in chronic kidney disease: Secondary | ICD-10-CM | POA: Diagnosis present

## 2022-06-16 DIAGNOSIS — I161 Hypertensive emergency: Secondary | ICD-10-CM

## 2022-06-16 DIAGNOSIS — E8809 Other disorders of plasma-protein metabolism, not elsewhere classified: Secondary | ICD-10-CM | POA: Diagnosis present

## 2022-06-16 DIAGNOSIS — E874 Mixed disorder of acid-base balance: Secondary | ICD-10-CM | POA: Diagnosis not present

## 2022-06-16 DIAGNOSIS — J95811 Postprocedural pneumothorax: Secondary | ICD-10-CM | POA: Diagnosis not present

## 2022-06-16 DIAGNOSIS — F17210 Nicotine dependence, cigarettes, uncomplicated: Secondary | ICD-10-CM | POA: Diagnosis present

## 2022-06-16 DIAGNOSIS — I5043 Acute on chronic combined systolic (congestive) and diastolic (congestive) heart failure: Secondary | ICD-10-CM | POA: Diagnosis present

## 2022-06-16 DIAGNOSIS — E1142 Type 2 diabetes mellitus with diabetic polyneuropathy: Secondary | ICD-10-CM | POA: Diagnosis present

## 2022-06-16 DIAGNOSIS — I132 Hypertensive heart and chronic kidney disease with heart failure and with stage 5 chronic kidney disease, or end stage renal disease: Secondary | ICD-10-CM | POA: Diagnosis present

## 2022-06-16 DIAGNOSIS — Y842 Radiological procedure and radiotherapy as the cause of abnormal reaction of the patient, or of later complication, without mention of misadventure at the time of the procedure: Secondary | ICD-10-CM | POA: Diagnosis present

## 2022-06-16 DIAGNOSIS — E876 Hypokalemia: Secondary | ICD-10-CM | POA: Diagnosis not present

## 2022-06-16 DIAGNOSIS — Z8249 Family history of ischemic heart disease and other diseases of the circulatory system: Secondary | ICD-10-CM

## 2022-06-16 DIAGNOSIS — N179 Acute kidney failure, unspecified: Secondary | ICD-10-CM | POA: Diagnosis present

## 2022-06-16 DIAGNOSIS — Z7982 Long term (current) use of aspirin: Secondary | ICD-10-CM

## 2022-06-16 DIAGNOSIS — I5020 Unspecified systolic (congestive) heart failure: Secondary | ICD-10-CM | POA: Diagnosis not present

## 2022-06-16 DIAGNOSIS — J449 Chronic obstructive pulmonary disease, unspecified: Secondary | ICD-10-CM | POA: Diagnosis present

## 2022-06-16 DIAGNOSIS — I5023 Acute on chronic systolic (congestive) heart failure: Secondary | ICD-10-CM | POA: Diagnosis present

## 2022-06-16 DIAGNOSIS — E1122 Type 2 diabetes mellitus with diabetic chronic kidney disease: Secondary | ICD-10-CM | POA: Diagnosis present

## 2022-06-16 DIAGNOSIS — I509 Heart failure, unspecified: Secondary | ICD-10-CM | POA: Diagnosis not present

## 2022-06-16 DIAGNOSIS — I314 Cardiac tamponade: Principal | ICD-10-CM | POA: Diagnosis present

## 2022-06-16 DIAGNOSIS — N185 Chronic kidney disease, stage 5: Secondary | ICD-10-CM | POA: Diagnosis not present

## 2022-06-16 DIAGNOSIS — I5022 Chronic systolic (congestive) heart failure: Secondary | ICD-10-CM | POA: Diagnosis present

## 2022-06-16 DIAGNOSIS — J9621 Acute and chronic respiratory failure with hypoxia: Secondary | ICD-10-CM | POA: Diagnosis present

## 2022-06-16 DIAGNOSIS — I319 Disease of pericardium, unspecified: Secondary | ICD-10-CM | POA: Diagnosis present

## 2022-06-16 DIAGNOSIS — N186 End stage renal disease: Secondary | ICD-10-CM | POA: Diagnosis not present

## 2022-06-16 DIAGNOSIS — M109 Gout, unspecified: Secondary | ICD-10-CM | POA: Diagnosis present

## 2022-06-16 DIAGNOSIS — Z880 Allergy status to penicillin: Secondary | ICD-10-CM

## 2022-06-16 DIAGNOSIS — J918 Pleural effusion in other conditions classified elsewhere: Secondary | ICD-10-CM | POA: Diagnosis present

## 2022-06-16 DIAGNOSIS — Z6821 Body mass index (BMI) 21.0-21.9, adult: Secondary | ICD-10-CM

## 2022-06-16 DIAGNOSIS — Z833 Family history of diabetes mellitus: Secondary | ICD-10-CM

## 2022-06-16 DIAGNOSIS — E872 Acidosis, unspecified: Secondary | ICD-10-CM | POA: Diagnosis not present

## 2022-06-16 DIAGNOSIS — E440 Moderate protein-calorie malnutrition: Secondary | ICD-10-CM | POA: Insufficient documentation

## 2022-06-16 DIAGNOSIS — Z91018 Allergy to other foods: Secondary | ICD-10-CM

## 2022-06-16 DIAGNOSIS — M898X9 Other specified disorders of bone, unspecified site: Secondary | ICD-10-CM | POA: Diagnosis present

## 2022-06-16 DIAGNOSIS — I110 Hypertensive heart disease with heart failure: Secondary | ICD-10-CM | POA: Diagnosis not present

## 2022-06-16 DIAGNOSIS — C3411 Malignant neoplasm of upper lobe, right bronchus or lung: Secondary | ICD-10-CM | POA: Diagnosis present

## 2022-06-16 DIAGNOSIS — Z794 Long term (current) use of insulin: Secondary | ICD-10-CM | POA: Diagnosis not present

## 2022-06-16 DIAGNOSIS — R339 Retention of urine, unspecified: Secondary | ICD-10-CM | POA: Diagnosis not present

## 2022-06-16 DIAGNOSIS — R011 Cardiac murmur, unspecified: Secondary | ICD-10-CM | POA: Diagnosis present

## 2022-06-16 DIAGNOSIS — G8929 Other chronic pain: Secondary | ICD-10-CM | POA: Diagnosis present

## 2022-06-16 DIAGNOSIS — Z85118 Personal history of other malignant neoplasm of bronchus and lung: Secondary | ICD-10-CM

## 2022-06-16 DIAGNOSIS — Z8673 Personal history of transient ischemic attack (TIA), and cerebral infarction without residual deficits: Secondary | ICD-10-CM

## 2022-06-16 HISTORY — DX: Hypertensive emergency: I16.1

## 2022-06-16 LAB — CBC WITH DIFFERENTIAL/PLATELET
Abs Immature Granulocytes: 0.02 10*3/uL (ref 0.00–0.07)
Basophils Absolute: 0 10*3/uL (ref 0.0–0.1)
Basophils Relative: 0 %
Eosinophils Absolute: 0 10*3/uL (ref 0.0–0.5)
Eosinophils Relative: 0 %
HCT: 31.8 % — ABNORMAL LOW (ref 36.0–46.0)
Hemoglobin: 10 g/dL — ABNORMAL LOW (ref 12.0–15.0)
Immature Granulocytes: 0 %
Lymphocytes Relative: 11 %
Lymphs Abs: 1.2 10*3/uL (ref 0.7–4.0)
MCH: 27.3 pg (ref 26.0–34.0)
MCHC: 31.4 g/dL (ref 30.0–36.0)
MCV: 86.9 fL (ref 80.0–100.0)
Monocytes Absolute: 0.5 10*3/uL (ref 0.1–1.0)
Monocytes Relative: 4 %
Neutro Abs: 9.3 10*3/uL — ABNORMAL HIGH (ref 1.7–7.7)
Neutrophils Relative %: 85 %
Platelets: 440 10*3/uL — ABNORMAL HIGH (ref 150–400)
RBC: 3.66 MIL/uL — ABNORMAL LOW (ref 3.87–5.11)
RDW: 17 % — ABNORMAL HIGH (ref 11.5–15.5)
WBC: 11.1 10*3/uL — ABNORMAL HIGH (ref 4.0–10.5)
nRBC: 0 % (ref 0.0–0.2)

## 2022-06-16 LAB — RESP PANEL BY RT-PCR (FLU A&B, COVID) ARPGX2
Influenza A by PCR: NEGATIVE
Influenza B by PCR: NEGATIVE
SARS Coronavirus 2 by RT PCR: NEGATIVE

## 2022-06-16 LAB — TROPONIN I (HIGH SENSITIVITY)
Troponin I (High Sensitivity): 44 ng/L — ABNORMAL HIGH (ref <18)
Troponin I (High Sensitivity): 51 ng/L — ABNORMAL HIGH (ref <18)

## 2022-06-16 LAB — COMPREHENSIVE METABOLIC PANEL
ALT: 14 U/L (ref 0–44)
AST: 19 U/L (ref 15–41)
Albumin: 2.4 g/dL — ABNORMAL LOW (ref 3.5–5.0)
Alkaline Phosphatase: 194 U/L — ABNORMAL HIGH (ref 38–126)
Anion gap: 8 (ref 5–15)
BUN: 55 mg/dL — ABNORMAL HIGH (ref 6–20)
CO2: 18 mmol/L — ABNORMAL LOW (ref 22–32)
Calcium: 8 mg/dL — ABNORMAL LOW (ref 8.9–10.3)
Chloride: 116 mmol/L — ABNORMAL HIGH (ref 98–111)
Creatinine, Ser: 5.46 mg/dL — ABNORMAL HIGH (ref 0.44–1.00)
GFR, Estimated: 8 mL/min — ABNORMAL LOW (ref >60)
Glucose, Bld: 211 mg/dL — ABNORMAL HIGH (ref 70–99)
Potassium: 3.8 mmol/L (ref 3.5–5.1)
Sodium: 142 mmol/L (ref 135–145)
Total Bilirubin: 0.4 mg/dL (ref 0.3–1.2)
Total Protein: 6.6 g/dL (ref 6.5–8.1)

## 2022-06-16 LAB — I-STAT VENOUS BLOOD GAS, ED
Acid-base deficit: 10 mmol/L — ABNORMAL HIGH (ref 0.0–2.0)
Bicarbonate: 16.3 mmol/L — ABNORMAL LOW (ref 20.0–28.0)
Calcium, Ion: 1.16 mmol/L (ref 1.15–1.40)
HCT: 31 % — ABNORMAL LOW (ref 36.0–46.0)
Hemoglobin: 10.5 g/dL — ABNORMAL LOW (ref 12.0–15.0)
O2 Saturation: 50 %
Potassium: 3.8 mmol/L (ref 3.5–5.1)
Sodium: 142 mmol/L (ref 135–145)
TCO2: 17 mmol/L — ABNORMAL LOW (ref 22–32)
pCO2, Ven: 37.1 mmHg — ABNORMAL LOW (ref 44–60)
pH, Ven: 7.25 (ref 7.25–7.43)
pO2, Ven: 31 mmHg — CL (ref 32–45)

## 2022-06-16 LAB — GLUCOSE, CAPILLARY: Glucose-Capillary: 164 mg/dL — ABNORMAL HIGH (ref 70–99)

## 2022-06-16 LAB — BRAIN NATRIURETIC PEPTIDE: B Natriuretic Peptide: 3614.4 pg/mL — ABNORMAL HIGH (ref 0.0–100.0)

## 2022-06-16 MED ORDER — IPRATROPIUM-ALBUTEROL 0.5-2.5 (3) MG/3ML IN SOLN: 3.0000 mL | Freq: Once | RESPIRATORY_TRACT | Status: AC

## 2022-06-16 MED ORDER — FUROSEMIDE 10 MG/ML IJ SOLN
40.0000 mg | Freq: Once | INTRAMUSCULAR | Status: AC
  Administered 2022-06-16: 40 mg via INTRAVENOUS
  Filled 2022-06-16: qty 4

## 2022-06-16 MED ORDER — IPRATROPIUM-ALBUTEROL 0.5-2.5 (3) MG/3ML IN SOLN
RESPIRATORY_TRACT | Status: AC
  Administered 2022-06-16: 3 mL via RESPIRATORY_TRACT
  Filled 2022-06-16: qty 3

## 2022-06-16 MED ORDER — ALBUTEROL SULFATE (2.5 MG/3ML) 0.083% IN NEBU: 2.5000 mg | INHALATION_SOLUTION | Freq: Four times a day (QID) | RESPIRATORY_TRACT | Status: DC | PRN

## 2022-06-16 MED ORDER — HEPARIN SODIUM (PORCINE) 5000 UNIT/ML IJ SOLN
5000.0000 [IU] | Freq: Three times a day (TID) | INTRAMUSCULAR | Status: DC
  Administered 2022-06-16 – 2022-06-17 (×4): 5000 [IU] via SUBCUTANEOUS
  Filled 2022-06-16 (×4): qty 1

## 2022-06-16 MED ORDER — ALBUTEROL SULFATE (2.5 MG/3ML) 0.083% IN NEBU
INHALATION_SOLUTION | RESPIRATORY_TRACT | Status: AC
  Administered 2022-06-16: 2.5 mg via RESPIRATORY_TRACT
  Filled 2022-06-16: qty 3

## 2022-06-16 MED ORDER — LIDOCAINE 5 % EX PTCH
1.0000 | MEDICATED_PATCH | CUTANEOUS | Status: DC
  Administered 2022-06-16 – 2022-06-17 (×2): 1 via TRANSDERMAL
  Filled 2022-06-16 (×2): qty 1

## 2022-06-16 MED ORDER — NITROGLYCERIN 0.4 MG SL SUBL: 0.4000 mg | SUBLINGUAL_TABLET | SUBLINGUAL | Status: DC | PRN

## 2022-06-16 MED ORDER — ACETAMINOPHEN 325 MG PO TABS
650.0000 mg | ORAL_TABLET | Freq: Four times a day (QID) | ORAL | Status: DC | PRN
  Administered 2022-06-16 – 2022-06-17 (×3): 650 mg via ORAL
  Filled 2022-06-16 (×4): qty 2

## 2022-06-16 MED ORDER — NITROGLYCERIN IN D5W 200-5 MCG/ML-% IV SOLN
0.0000 ug/min | INTRAVENOUS | Status: DC
  Administered 2022-06-16: 5 ug/min via INTRAVENOUS
  Administered 2022-06-17: 60 ug/min via INTRAVENOUS
  Filled 2022-06-16 (×2): qty 250

## 2022-06-16 MED ORDER — ASPIRIN 81 MG PO CHEW
324.0000 mg | CHEWABLE_TABLET | Freq: Once | ORAL | Status: AC
  Administered 2022-06-16: 324 mg via ORAL
  Filled 2022-06-16: qty 4

## 2022-06-16 MED ORDER — TRAZODONE HCL 50 MG PO TABS
50.0000 mg | ORAL_TABLET | Freq: Every evening | ORAL | Status: DC | PRN
  Administered 2022-06-16 – 2022-07-01 (×11): 50 mg via ORAL
  Filled 2022-06-16 (×11): qty 1

## 2022-06-16 MED ORDER — INSULIN ASPART 100 UNIT/ML IJ SOLN
0.0000 [IU] | INTRAMUSCULAR | Status: DC
  Administered 2022-06-16: 2 [IU] via SUBCUTANEOUS
  Administered 2022-06-17: 1 [IU] via SUBCUTANEOUS
  Administered 2022-06-17: 2 [IU] via SUBCUTANEOUS
  Administered 2022-06-18 (×2): 1 [IU] via SUBCUTANEOUS
  Administered 2022-06-19: 2 [IU] via SUBCUTANEOUS
  Administered 2022-06-19: 1 [IU] via SUBCUTANEOUS

## 2022-06-16 MED ORDER — GABAPENTIN 100 MG PO CAPS
100.0000 mg | ORAL_CAPSULE | Freq: Every day | ORAL | Status: DC
  Administered 2022-06-16 – 2022-07-01 (×13): 100 mg via ORAL
  Filled 2022-06-16 (×16): qty 1

## 2022-06-16 MED ORDER — ALBUTEROL SULFATE (2.5 MG/3ML) 0.083% IN NEBU: 2.5000 mg | INHALATION_SOLUTION | Freq: Once | RESPIRATORY_TRACT | Status: AC
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
## 2022-06-16 NOTE — ED Notes (Signed)
[facility] on site to transport patient to [facility]

## 2022-06-16 NOTE — ED Notes (Signed)
[facility] info updated in computer

## 2022-06-16 NOTE — ED Notes (Signed)
Rate changed on nitro

## 2022-06-16 NOTE — ED Notes (Signed)
Called to registration window for diff breathing.  Patient on R/A, 2 2lpm @ home O2.  SpO2 84% active exhalation retractions.  Patient to room 12 and placed on 6lpm O2.

## 2022-06-16 NOTE — ED Notes (Signed)
Patient was cleaned up and dressed with new bedding and placed in a gown. Patient also given 2 warm blankets.

## 2022-06-16 NOTE — ED Notes (Signed)
Pt off BIPAP at this time and she voided

## 2022-06-16 NOTE — ED Provider Notes (Signed)
[facility] DEPART[facility] Provider Note   CSN: [id number] Arrival date & time: 06/16/22  5053     History  Chief Complaint  Patient presents with   Shortness of Breath    Amber Cross is a 61 y.o. female.  Patient presents to the emergency department complaining of shortness of breath.  Patient states she began feeling short of breath last night.  She states that she uses 2 L/min of oxygen at home at baseline and increased to 5 L/min last night with no relief.  She states that on 2 L/min she normally has saturations in the 92% range.  Upon arrival at the emergency department she was not wearing oxygen and was noted to have an 84% room air saturation.  The patient also endorses central chest pain, described as tightness, rated 9 out of 10 in severity with no radiation of symptoms.  She denies abdominal pain, nausea, vomiting, fever, cough at this time.  The patient does endorse multiple admissions for respiratory failure in the past.  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 Authorizing Provider  acetaminophen (TYLENOL) 500 MG tablet Take 1,000 mg by mouth every 6 (six) hours as needed for mild pain.    [provider]  albuterol (VENTOLIN HFA) 108 (90 Base) MCG/ACT inhaler Inhale 2 puffs into the lungs every 6 (six) hours as needed for wheezing or shortness of breath. 02/28/22   Farrel Gordon, DO  amLODipine (NORVASC) 10 MG tablet Take 1 tablet (10 mg total) by mouth daily. 02/04/22   Lacinda Axon, MD  aspirin EC 81 MG tablet Take 1 tablet (81 mg total) by mouth daily. Swallow whole. 02/28/22   Farrel Gordon, DO  carvedilol (COREG) 25 MG tablet  Take 1 tablet (25 mg total) by mouth 2 (two) times daily with a meal. Patient taking differently: Take 50 mg by mouth 2 (two) times daily with a meal. 03/18/22 05/10/22  Darliss Cheney, MD  Continuous Blood Gluc Sensor (DEXCOM G7 SENSOR) MISC Apply each device on your skin every 10 days 03/19/22   [name], DO  gabapentin (NEURONTIN) 300 MG capsule Take 1 capsule (300 mg total) by mouth daily. Patient taking differently: Take 300 mg by mouth at bedtime. 11/23/21 05/10/22  Rosezetta Schlatter, MD  hydrALAZINE (APRESOLINE) 50 MG tablet Take 1 tablet (50 mg total) by mouth every 8 (eight) hours. Patient taking differently: Take 100 mg by mouth every 8 (eight) hours. 02/03/22   Lacinda Axon, MD  hydrOXYzine (ATARAX) 25 MG tablet Take 1 tablet (25 mg total) by mouth at bedtime. Patient not taking: Reported on 05/10/2022 02/03/22   Lacinda Axon, MD  insulin isophane & regular human KwikPen (NOVOLIN 70/30 KWIKPEN) (70-30) 100 UNIT/ML KwikPen Inject 15 Units into the skin in the morning AND 5 Units every evening. Patient taking differently: Inject 5 mg twice a day 03/19/22   [name], DO  Insulin Pen Needle (UNIFINE PENTIPS) 32G X 4 MM MISC Use in the morning and at bedtime 12/11/21 12/06/22  Iona Beard, MD  isosorbide mononitrate (IMDUR) 30 MG 24 hr tablet Take 1 tablet (30 mg total) by mouth daily. 03/19/22 05/10/22  Darliss Cheney, MD  nitroGLYCERIN (NITROSTAT) 0.4 MG SL tablet Place 0.4 mg under the tongue every 5 (five) minutes as needed for chest pain. 10/01/21   [provider]  rosuvastatin (CRESTOR) 40 MG tablet Take 1 tablet (40 mg total) by mouth daily. Patient not taking: Reported on 03/22/2022 11/24/21   Rosezetta Schlatter, MD  torsemide (DEMADEX) 20 MG tablet Take 40 mg by mouth 2 (two) times daily. 02/28/22   [provider]  traZODone (DESYREL) 50 MG tablet Take 50 mg by mouth at bedtime. 03/30/22   [provider]  Vitamin D, Ergocalciferol, (DRISDOL) 1.25 MG  (50000 UNIT) CAPS capsule Take 1 capsule (50,000 Units total) by mouth every 7 (seven) days. Patient not taking: Reported on 05/10/2022 02/07/22   Lacinda Axon, MD      Allergies    Penicillins, Strawberry (diagnostic), and Tomato    Review of Systems   Review of Systems  Respiratory:  Positive for chest tightness and shortness of breath. Negative for cough and stridor.   Cardiovascular:  Positive for chest pain and leg swelling. Negative for palpitations.  Gastrointestinal:  Negative for abdominal pain, nausea and vomiting.  Genitourinary:  Negative for dysuria.  Neurological:  Negative for headaches.    Physical Exam Updated Vital Signs BP (!) 206/97   Pulse 92   Temp 97.8 F (36.6 C) (Oral)   Resp 20   Ht 5\' 7"  (1.702 m)   Wt 66.2 kg   LMP 09/29/2011   SpO2 97%   BMI 22.86 kg/m  Physical Exam Vitals and nursing note reviewed.  Constitutional:      General: She is in acute distress.     Appearance: She is normal weight.  HENT:     Head: Normocephalic and atraumatic.     Mouth/Throat:     Mouth: Mucous membranes are moist.  Eyes:     Extraocular Movements: Extraocular movements intact.  Cardiovascular:     Rate and Rhythm: Regular rhythm. Tachycardia present.     Pulses: Normal pulses.     Heart sounds: Normal heart sounds.  Pulmonary:     Effort: Tachypnea, accessory muscle usage and respiratory distress present.     Breath sounds: Decreased breath sounds (Mildly diminished breath sounds all fields) present. No wheezing or rales.  Chest:     Chest wall: No tenderness.  Abdominal:     Palpations: Abdomen is soft.     Tenderness: There is no abdominal tenderness.  Musculoskeletal:     Cervical back: Normal range of motion and neck supple.     Right lower leg: Edema present.     Left lower leg: Edema present.  Skin:    General: Skin is warm and dry.     Capillary Refill: Capillary refill takes less than 2 seconds.  Neurological:     Mental Status: She  is alert and oriented to person, place, and time.     ED Results / Procedures / Treatments   Labs (all labs ordered are listed, but only abnormal results are displayed) Labs Reviewed  CBC WITH DIFFERENTIAL/PLATELET - Abnormal; Notable for the following components:      Result Value   WBC 11.1 (*)    RBC 3.66 (*)    Hemoglobin 10.0 (*)    HCT 31.8 (*)    RDW 17.0 (*)    Platelets 440 (*)    Neutro Abs 9.3 (*)    All other components within normal limits  BRAIN NATRIURETIC PEPTIDE - Abnormal; Notable for the following  components:   B Natriuretic Peptide 3,614.4 (*)    All other components within normal limits  COMPREHENSIVE METABOLIC PANEL - Abnormal; Notable for the following components:   Chloride 116 (*)    CO2 18 (*)    Glucose, Bld 211 (*)    BUN 55 (*)    Creatinine, Ser 5.46 (*)    Calcium 8.0 (*)    Albumin 2.4 (*)    Alkaline Phosphatase 194 (*)    GFR, Estimated 8 (*)    All other components within normal limits  I-STAT VENOUS BLOOD GAS, ED - Abnormal; Notable for the following components:   pCO2, Ven 37.1 (*)    pO2, Ven 31 (*)    Bicarbonate 16.3 (*)    TCO2 17 (*)    Acid-base deficit 10.0 (*)    HCT 31.0 (*)    Hemoglobin 10.5 (*)    All other components within normal limits  TROPONIN I (HIGH SENSITIVITY) - Abnormal; Notable for the following components:   Troponin I (High Sensitivity) 44 (*)    All other components within normal limits  RESP PANEL BY RT-PCR (FLU A&B, COVID) ARPGX2  TROPONIN I (HIGH SENSITIVITY)    EKG None  Radiology DG Chest Port 1 View  Result Date: 06/16/2022 CLINICAL DATA:  Shortness of breath EXAM: PORTABLE CHEST 1 VIEW COMPARISON:  April 30, 2022 FINDINGS: Mediastinal contour is normal. The heart size is enlarged. Patchy consolidation of bilateral lung bases with bilateral pleural effusions are noted. Mild increased pulmonary interstitium is identified bilaterally. The visualized skeletal structures are unremarkable.  IMPRESSION: 1. Congestive heart failure. 2. Patchy consolidation of bilateral lung bases with bilateral pleural effusions. Electronically Signed   By: Abelardo Diesel M.D.   On: 06/16/2022 10:22    Procedures .Critical Care  Performed by: Dorothyann Peng, PA-C Authorized by: Dorothyann Peng, PA-C   Critical care provider statement:    Critical care time (minutes):  30   Critical care time was exclusive of:  Separately billable procedures and treating other patients   Critical care was necessary to treat or prevent imminent or life-threatening deterioration of the following conditions:  Respiratory failure   Critical care was time spent personally by me on the following activities:  Development of treatment plan with patient or surrogate, discussions with consultants, evaluation of patient's response to treatment, examination of patient, ordering and review of laboratory studies, ordering and review of radiographic studies, ordering and performing treatments and interventions, pulse oximetry, re-evaluation of patient's condition and review of old charts   Care discussed with: admitting provider       Medications Ordered in ED Medications  nitroGLYCERIN (NITROSTAT) SL tablet 0.4 mg (has no administration in time range)  nitroGLYCERIN 50 mg in dextrose 5 % 250 mL (0.2 mg/mL) infusion (10 mcg/min Intravenous Rate/Dose Change 06/16/22 1129)  ipratropium-albuterol (DUONEB) 0.5-2.5 (3) MG/3ML nebulizer solution 3 mL (3 mLs Nebulization Given 06/16/22 1008)  albuterol (PROVENTIL) (2.5 MG/3ML) 0.083% nebulizer solution 2.5 mg (2.5 mg Nebulization Not Given 06/16/22 1046)  aspirin chewable tablet 324 mg (324 mg Oral Given 06/16/22 1054)  furosemide (LASIX) injection 40 mg (40 mg Intravenous Given 06/16/22 1052)    ED Course/ Medical Decision Making/ A&P                           Medical Decision Making Amount and/or Complexity of Data Reviewed Labs: ordered. Radiology: ordered.  Risk OTC  drugs. Prescription drug management.  This patient presents to the ED for concern of shortness of breath, this involves an extensive number of treatment options, and is a complaint that carries with it a high risk of complications and morbidity.  The differential diagnosis includes CHF exacerbation, COPD exacerbation, hypertensive urgency, fluid overload, ACS, PE, and others   Co morbidities that complicate the patient evaluation  History of CHF   Additional history obtained:   External records from outside source obtained and reviewed including emergency department notes from September 11 and October 3 which resulted in admissions for respiratory failure due to CHF exacerbations and hypoxic respiratory failure.   Lab Tests:  I Ordered, and personally interpreted labs.  The pertinent results include: VBG with PCO2 37.1, PO2 31, HCO3 16.3; creatinine 5.46 (baseline around 3.4), WBC 11.1, BNP 3614.4, initial troponin 44   Imaging Studies ordered:  I ordered imaging studies including chest x-ray I independently visualized and interpreted imaging which showed  1. Congestive heart failure.  2. Patchy consolidation of bilateral lung bases with bilateral  pleural effusions   I agree with the radiologist interpretation   Cardiac Monitoring: / EKG:  The patient was maintained on a cardiac monitor.  I personally viewed and interpreted the cardiac monitored which showed an underlying rhythm of: Sinus tachycardia   Consultations Obtained:  I requested consultation with the internal medicine teaching service resident, Dr. Raymondo Band,  Dr. Johnnye Sima attending, and discussed lab and imaging findings as well as pertinent plan - they recommend: admission to [facility]   Problem List / ED Course / Critical interventions / Medication management  Heart failure exacerbation I ordered medication including aspirin, nitroglycerin drip, Lasix, albuterol, DuoNeb Reevaluation of the patient after these  medicines showed that the patient improved I had the patient placed on BiPAP for respiratory failure.   Social Determinants of Health:  Patient lives at home with her son   Test / Admission - Considered:  The patient will need admission for acute respiratory failure.  She has increased oxygen requirements and is requiring BiPAP for ease of breathing at this time.        Final Clinical Impression(s) / ED Diagnoses Final diagnoses:  Acute on chronic systolic congestive heart failure (HCC)  Acute on chronic respiratory failure with hypoxia [facility])    Rx / DC Orders ED Discharge Orders     None         Ronny Bacon 06/16/22 1154    Gareth Morgan, MD 06/16/22 2107

## 2022-06-16 NOTE — Hospital Course (Addendum)
Amber Cross is a 61 y.o. female living with HFrEF, type 2 diabetes, small cell lung cancer, CKD 4 who presented with acute on chronic hypoxic respiratory failure secondary to moderate-large pericardial effusion with cardiac tamponade.  Patient underwent subxiphoid pericardiocentesis on 11/21 which collected about 5 cc of clear fluid.  Cytology of the pleural and pericardial fluid were negative for malignancy.  Patient was observed in the ICU after procedure, and slowly was downgraded to progressive.  Current concern, include worsening renal disease requiring dialysis.   #Cardiac tamponade s/p pericardial window, resolved  Patient initially presented to the emergency room with concerns of hypertension with blood pressures into the 200s.  There was concern for heart failure exacerbation and echo was performed, showing large pericardial effusion.  Patient was found to be in cardiac tamponade and patient required urgent pericardial window with cardiothoracic surgery.  Cytology was negative for any malignancy.  Patient was observed in the ICU afterwards and progressed well.  Patient respiratory symptoms resolved, and patient's blood pressure was stable afterwards.    #ESRD #Nephrotic range proteinuria #Non anion gapped metabolic acidosis likely in the setting of ESRD During hospitalization, patient initially was CKD stage IV, and progressed to ESRD.  Patient started hemodialysis during hospitalization, and had AV fistula placed for permanent access.  Patient has received HD during hospitalization, and tolerated well.  Patient to get chair outpatient for dialysis Monday Wednesday Friday.    #Right pleural effusion s/p chest tube placement, resolving Patient developed recurrent right pleural effusion, as well as left pleural effusions during hospitalization.  Patient required chest tube.  Due to recurrence on right side, patient had pleurodesis with removal of chest tube.  Pleural effusions likely secondary  to heart failure with reduced ejection fraction not responsive to Lasix given worsening renal failure.  Continue HD outpatient.  Respiratory status remained stable.   #HFrEF #Hypertension Patient blood pressures remained stable during hospitalization with no hypotension.  Patient was continued on hydralazine 50 mg 3 times daily, Coreg 12.5 mg twice daily, Imdur 30 mg daily, and amlodipine 10 mg daily.  #Hypokalemia Patient's electrolytes were replaced with HD during hospitalization  #Small right apical pneumothorax Patient's chest x-ray showed recurrent right apical pneumothorax, which self resolved.   #Stage IA small cell lung cancer in Right upper lobe #Tobacco use disorder Diagnosed in May 2023.  Patient has had radiation treatment in the past.  CT chest in hospital did not show nodule given opacities.  MRI of the brain during hospitalization did not show any mets.  Continue to follow-up outpatient.   #Anemia of Chronic disease  Patient hemoglobin remained stable during hospitalization.  No concerns.   #Type 2 diabetes Patient's A1c during hospitalization 6.7.  Patient remained on sliding scale insulin during hospitalization with no concerns.  Patient discharged on home regimen of insulin.

## 2022-06-16 NOTE — ED Triage Notes (Addendum)
Pt arrives POV supposed to be on O2 but did not bring it with her , pulse ox on RA 84 percent RA , pt sob all night she states  states put on her O2 but did not make her any better , , no cough, resp at bedside pt place on 4 ll [place]

## 2022-06-17 ENCOUNTER — Inpatient Hospital Stay (HOSPITAL_COMMUNITY): Payer: Commercial Managed Care - HMO

## 2022-06-17 DIAGNOSIS — I161 Hypertensive emergency: Secondary | ICD-10-CM

## 2022-06-17 DIAGNOSIS — I3139 Other pericardial effusion (noninflammatory): Secondary | ICD-10-CM | POA: Diagnosis not present

## 2022-06-17 DIAGNOSIS — I314 Cardiac tamponade: Secondary | ICD-10-CM

## 2022-06-17 DIAGNOSIS — E1142 Type 2 diabetes mellitus with diabetic polyneuropathy: Secondary | ICD-10-CM | POA: Diagnosis not present

## 2022-06-17 DIAGNOSIS — N179 Acute kidney failure, unspecified: Secondary | ICD-10-CM | POA: Diagnosis not present

## 2022-06-17 DIAGNOSIS — I5023 Acute on chronic systolic (congestive) heart failure: Secondary | ICD-10-CM

## 2022-06-17 LAB — ECHOCARDIOGRAM COMPLETE
Area-P 1/2: 4.06 cm2
Calc EF: 46.9 %
Height: 67 in
MV M vel: 5.75 m/s
MV Peak grad: 132.3 mmHg
Radius: 0.7 cm
S' Lateral: 4.1 cm
Single Plane A2C EF: 53.4 %
Single Plane A4C EF: 43.4 %
Weight: 2366.86 oz

## 2022-06-17 LAB — BASIC METABOLIC PANEL
Anion gap: 8 (ref 5–15)
BUN: 56 mg/dL — ABNORMAL HIGH (ref 6–20)
CO2: 17 mmol/L — ABNORMAL LOW (ref 22–32)
Calcium: 7.6 mg/dL — ABNORMAL LOW (ref 8.9–10.3)
Chloride: 116 mmol/L — ABNORMAL HIGH (ref 98–111)
Creatinine, Ser: 5.53 mg/dL — ABNORMAL HIGH (ref 0.44–1.00)
GFR, Estimated: 8 mL/min — ABNORMAL LOW (ref >60)
Glucose, Bld: 105 mg/dL — ABNORMAL HIGH (ref 70–99)
Potassium: 3.7 mmol/L (ref 3.5–5.1)
Sodium: 141 mmol/L (ref 135–145)

## 2022-06-17 LAB — CBC
HCT: 24.4 % — ABNORMAL LOW (ref 36.0–46.0)
Hemoglobin: 7.8 g/dL — ABNORMAL LOW (ref 12.0–15.0)
MCH: 28.3 pg (ref 26.0–34.0)
MCHC: 32 g/dL (ref 30.0–36.0)
MCV: 88.4 fL (ref 80.0–100.0)
Platelets: 303 10*3/uL (ref 150–400)
RBC: 2.76 MIL/uL — ABNORMAL LOW (ref 3.87–5.11)
RDW: 16.7 % — ABNORMAL HIGH (ref 11.5–15.5)
WBC: 6.1 10*3/uL (ref 4.0–10.5)
nRBC: 0 % (ref 0.0–0.2)

## 2022-06-17 LAB — GLUCOSE, CAPILLARY
Glucose-Capillary: 101 mg/dL — ABNORMAL HIGH (ref 70–99)
Glucose-Capillary: 112 mg/dL — ABNORMAL HIGH (ref 70–99)
Glucose-Capillary: 123 mg/dL — ABNORMAL HIGH (ref 70–99)
Glucose-Capillary: 130 mg/dL — ABNORMAL HIGH (ref 70–99)
Glucose-Capillary: 176 mg/dL — ABNORMAL HIGH (ref 70–99)
Glucose-Capillary: 90 mg/dL (ref 70–99)
Glucose-Capillary: 99 mg/dL (ref 70–99)

## 2022-06-17 LAB — PROTIME-INR
INR: 1.1 (ref 0.8–1.2)
Prothrombin Time: 14.5 seconds (ref 11.4–15.2)

## 2022-06-17 LAB — PREPARE RBC (CROSSMATCH)

## 2022-06-17 LAB — APTT: aPTT: 32 seconds (ref 24–36)

## 2022-06-17 MED ORDER — AMLODIPINE BESYLATE 10 MG PO TABS
10.0000 mg | ORAL_TABLET | Freq: Every day | ORAL | Status: DC
  Administered 2022-06-17 – 2022-06-18 (×2): 10 mg via ORAL
  Filled 2022-06-17 (×3): qty 1

## 2022-06-17 MED ORDER — CEFAZOLIN SODIUM-DEXTROSE 2-4 GM/100ML-% IV SOLN
2.0000 g | INTRAVENOUS | Status: AC
  Administered 2022-06-18: 2 g via INTRAVENOUS

## 2022-06-17 MED ORDER — CARVEDILOL 25 MG PO TABS
25.0000 mg | ORAL_TABLET | Freq: Two times a day (BID) | ORAL | Status: DC
  Administered 2022-06-17 – 2022-06-19 (×3): 25 mg via ORAL
  Filled 2022-06-17 (×3): qty 1

## 2022-06-17 MED ORDER — BUTALBITAL-APAP-CAFFEINE 50-325-40 MG PO TABS
1.0000 | ORAL_TABLET | Freq: Four times a day (QID) | ORAL | Status: AC | PRN
  Administered 2022-06-17 (×2): 1 via ORAL
  Filled 2022-06-17 (×2): qty 1

## 2022-06-17 MED ORDER — ISOSORBIDE MONONITRATE ER 30 MG PO TB24
30.0000 mg | ORAL_TABLET | Freq: Every day | ORAL | Status: DC
  Administered 2022-06-17: 30 mg via ORAL
  Filled 2022-06-17: qty 1

## 2022-06-17 MED ORDER — HYDRALAZINE HCL 50 MG PO TABS
50.0000 mg | ORAL_TABLET | Freq: Three times a day (TID) | ORAL | Status: DC
  Administered 2022-06-17 (×2): 50 mg via ORAL
  Filled 2022-06-17 (×2): qty 1

## 2022-06-17 MED ORDER — FUROSEMIDE 10 MG/ML IJ SOLN
80.0000 mg | Freq: Once | INTRAMUSCULAR | Status: AC
  Administered 2022-06-17: 80 mg via INTRAVENOUS
  Filled 2022-06-17: qty 8

## 2022-06-17 MED ORDER — FUROSEMIDE 10 MG/ML IJ SOLN: 40.0000 mg | Freq: Once | INTRAMUSCULAR | Status: DC

## 2022-06-17 NOTE — Consult Note (Signed)
[address]            [place] [address]          [phone]       Adine Brindley Welda Medical Record #[id number] Date of Birth: 1960/11/01  No ref. provider found Dr Genoveva Ill, MD  Chief Complaint:    Chief Complaint  Patient presents with   Shortness of Breath  Patient examined, images of echocardiogram and CT scan of chest personally reviewed and discussed with patient and family.  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  She required oxygen and diuresis.  She completed her radiation therapy last summer and has been followed by oncology.  She developed increasing creatinine and a nephrotic syndrome and had a renal biopsy performed  5 weeks ago and does not know the diagnosis but was told her kidneys are nonfunctional.  This afternoon she had an echocardiogram which showed a moderate pericardial effusion measuring less than 2 cm with some diastolic compression of the right atrium and right ventricle.  LV systolic function is fairly well-preserved with significant LVH from her history of hypertension.  An echocardiogram performed 3 months ago showed a very small pericardial effusion without tamponade physiology.  CT surgery is consulted for pericardial window to drain the effusion and to obtain tissue for diagnosis.  CT scan of the chest this afternoon also shows a moderate right pleural effusion and a right pleural tube will be placed for cytology of the right pleural effusion.   Current Activity/ Functional Status: Patient is functional at home with several daughters support.   Past Medical History:  Diagnosis Date   Anemia    Arthritis    CHF (congestive heart failure) ([place])    Chronic kidney disease    Depression     Diabetes mellitus    Headache    Hypercholesteremia    Hypertension    Pseudogout    Stroke ([place]) 10/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   Tobacco abuse     Past Surgical History:  Procedure Laterality Date   ANTERIOR CRUCIATE LIGAMENT REPAIR Right    BRONCHIAL BIOPSY  12/18/2021   Procedure: BRONCHIAL BIOPSIES;  Surgeon: Garner Nash, DO;  Location: [facility];  Service: Pulmonary;;   BRONCHIAL BRUSHINGS  12/18/2021   Procedure: BRONCHIAL BRUSHINGS;  Surgeon: Garner Nash, DO;  Location: [facility];  Service: Pulmonary;;   BRONCHIAL NEEDLE ASPIRATION BIOPSY  12/18/2021   Procedure: BRONCHIAL NEEDLE ASPIRATION BIOPSIES;  Surgeon: Garner Nash, DO;  Location: [facility];  Service: Pulmonary;;   CATARACT EXTRACTION     FIDUCIAL MARKER PLACEMENT  12/18/2021   Procedure: FIDUCIAL MARKER PLACEMENT;  Surgeon: Garner Nash, DO;  Location: [facility];  Service: Pulmonary;;   OVARY SURGERY     RIGHT OOPHORECTOMY Right    SMALL INTESTINE SURGERY     TUBAL LIGATION     VIDEO BRONCHOSCOPY WITH RADIAL ENDOBRONCHIAL ULTRASOUND  12/18/2021   Procedure: RADIAL ENDOBRONCHIAL ULTRASOUND;  Surgeon: Garner Nash, DO;  Location: [facility];  Service: Pulmonary;;    Social History   Tobacco Use  Smoking Status Every Day   Packs/day: 0.50   Years: 40.00  Total pack years: 20.00   Types: Cigarettes  Smokeless Tobacco Never    Social History   Substance and Sexual Activity  Alcohol Use Not Currently    Social History   Socioeconomic History   Marital status: Married    Spouse name: Not on file   Number of children: Not on file   Years of education: Not on file   Highest education level: Not on file  Occupational History   Not on file  Tobacco Use   Smoking status: Every Day    Packs/day: 0.50    Years: 40.00    Total pack years: 20.00    Types: Cigarettes   Smokeless tobacco: Never  Vaping Use   Vaping Use: Never used  Substance and Sexual Activity   Alcohol use:  Not Currently   Drug use: Yes    Frequency: 4.0 times per week    Types: Marijuana    Comment: smokes every other day   Sexual activity: Not Currently    Birth control/protection: Abstinence  Other Topics Concern   Not on file  Social History Narrative   Not on file   Social Determinants of Health   Financial Resource Strain: Medium Risk (02/28/2022)   Overall Financial Resource Strain (CARDIA)    Difficulty of Paying Living Expenses: Somewhat hard  Food Insecurity: No Food Insecurity (06/16/2022)   Hunger Vital Sign    Worried About Running Out of Food in the Last Year: Never true    Ran Out of Food in the Last Year: Never true  Transportation Needs: Unmet Transportation Needs (06/16/2022)   PRAPARE - Transportation    Lack of Transportation (Medical): Yes    Lack of Transportation (Non-Medical): Yes  Physical Activity: Not on file  Stress: Stress Concern Present (02/28/2022)   [employer]    Feeling of Stress : To some extent  Social Connections: Not on file  Intimate Partner Violence: Not At Risk (06/16/2022)   Humiliation, Afraid, Rape, and Kick questionnaire    Fear of Current or Ex-Partner: No    Emotionally Abused: No    Physically Abused: No    Sexually Abused: No    Allergies  Allergen Reactions   Penicillins Itching and Other (See Comments)    redness   Strawberry (Diagnostic) Itching and Swelling   Tomato Itching and Swelling    Current Facility-Administered Medications  Medication Dose Route Frequency Provider Last Rate Last Admin   acetaminophen (TYLENOL) tablet 650 mg  650 mg Oral Q6H PRN Johny Blamer, DO   650 mg at 06/17/22 1206   albuterol (PROVENTIL) (2.5 MG/3ML) 0.083% nebulizer solution 2.5 mg  2.5 mg Nebulization Q6H PRN Gareth Morgan, MD       amLODipine (NORVASC) tablet 10 mg  10 mg Oral Daily Leigh Aurora, DO   10 mg at 06/17/22 1143   carvedilol (COREG) tablet 25 mg  25 mg  Oral BID WC Leigh Aurora, DO   25 mg at 06/17/22 1546   ceFAZolin (ANCEF) IVPB 2g/100 mL premix  2 g Intravenous 30 min Pre-Op Dahlia Byes, MD       gabapentin (NEURONTIN) capsule 100 mg  100 mg Oral QHS Johny Blamer, DO   100 mg at 06/16/22 2118   heparin injection 5,000 Units  5,000 Units Subcutaneous Q8H Johny Blamer, DO   5,000 Units at 06/17/22 1305   hydrALAZINE (APRESOLINE) tablet 50 mg  50 mg Oral Q8H Patel, Amar, DO   50  mg at 06/17/22 1304   insulin aspart (novoLOG) injection 0-9 Units  0-9 Units Subcutaneous Q4H Johny Blamer, DO   2 Units at 06/17/22 1623   isosorbide mononitrate (IMDUR) 24 hr tablet 30 mg  30 mg Oral Daily Leigh Aurora, DO   30 mg at 06/17/22 1143   lidocaine (LIDODERM) 5 % 1 patch  1 patch Transdermal Q24H Atway, Rayann N, DO   1 patch at 06/17/22 1857   nitroGLYCERIN (NITROSTAT) SL tablet 0.4 mg  0.4 mg Sublingual Q5 min PRN Cherlynn June B, PA-C       traZODone (DESYREL) tablet 50 mg  50 mg Oral QHS PRN Atway, Rayann N, DO   50 mg at 06/16/22 2118     Family History  Problem Relation Age of Onset   Diabetes Mother    Hypertension Mother    Hypertension Father      Review of Systems:     Cardiac Review of Systems: Y or N  Chest Pain [ x   ]  Resting SOB [  x ] Exertional SOB  [x  ]  Orthopnea [x  ]   Pedal Edema [   ]    Palpitations [  ] Syncope  [  ]   Presyncope [   ]  General Review of Systems: [Y] = yes [  ]=no Constitional: recent weight change [  ]; anorexia [  ]; fatigue [  ]; nausea [  ]; night sweats [  ]; fever [  ]; or chills [  ];                                                                                                                                          Dental: poor dentition[  ]; Last Dentist visit: Unknown  Eye : blurred vision [  ]; diplopia [   ]; vision changes [  ];  Amaurosis fugax[  ]; Resp: cough [ x ];  wheezing[  ];  hemoptysis[  ]; shortness of breath[ x ]; paroxysmal nocturnal dyspnea[  ]; dyspnea on  exertion[  ]; or orthopnea[ x ]; on home oxygen GI:  gallstones[  ], vomiting[  ];  dysphagia[  ]; melena[  ];  hematochezia [  ]; heartburn[  ];   Hx of  Colonoscopy[  ]; GU: kidney stones [  ]; hematuria[  ];   dysuria [  ];  nocturia[  ];  history of     obstruction [  ];                 Skin: rash, swelling[  ];, hair loss[  ];  peripheral edema[  ];  or itching[  ]; Musculosketetal: myalgias[  ];  joint swelling[  ];  joint erythema[  ];  joint pain[  ];  back pain[ x ];  Heme/Lymph: bruising[  ];  bleeding[  ];  anemia[  ];  Neuro: TIA[  ];  headaches[  ];  stroke[  ];  vertigo[  ];  seizures[  ];   paresthesias[  ];  difficulty walking[  ];  Psych:depression[ x ]; anxiety[  ];  Endocrine: diabetes[x  ];  thyroid dysfunction[  ];  Immunizations: Flu [  ]; Pneumococcal[  ];  Other:  Physical Exam: BP (!) 150/79   Pulse 88   Temp (!) 97.5 F (36.4 C) (Oral)   Resp 17   Ht 5\' 7"  (1.702 m)   Wt 67.1 kg   LMP 09/29/2011   SpO2 96%   BMI 23.17 kg/m       Physical Exam  General: 27-year-year-old AA female no acute distress on nasal cannula surrounded by family HEENT: Normocephalic pupils equal , dentition adequate Neck: Supple without JVD, adenopathy, or bruit Chest: Clear to auscultation, symmetrical breath sounds, no rhonchi, no tenderness             or deformity Cardiovascular: Regular rate and rhythm, no murmur, no gallop, peripheral pulses             palpable in all extremities Abdomen:  Soft, nontender, no palpable mass or organomegaly Extremities: Warm, well-perfused, no clubbing cyanosis edema or tenderness,              no venous stasis changes of the legs Rectal/GU: Deferred Neuro: Grossly non--focal and symmetrical throughout Skin: Clean and dry without rash or ulceration    Diagnostic Studies & Laboratory data:     Recent Radiology Findings:   ECHOCARDIOGRAM COMPLETE  Result Date: 06/17/2022    ECHOCARDIOGRAM REPORT   Patient Name:   [facility] Date  of Exam: 06/17/2022 Medical Rec #:  [id number]       Height:       67.0 in Accession #:    [id number]      Weight:       147.9 lb Date of Birth:  Dec 09, 1960       BSA:          1.779 m Patient Age:    44 years        BP:           185/88 mmHg Patient Gender: F               HR:           83 bpm. Exam Location:  Inpatient Procedure: 2D Echo, Cardiac Doppler and Color Doppler Indications:    Congestive Heart Failure I50.9  History:        Patient has prior history of Echocardiogram examinations, most                 recent 03/14/2022. CHF, Stroke; Risk Factors:Hypertension,                 Diabetes and Dyslipidemia. Small cell lung cancer, right upper                 lobe ([place]).  Sonographer:    Alvino Chapel RCS Referring Phys: [place]  1. Compared with the echo 02/2022, pericardial effusion is larger.  2. Left ventricular ejection fraction, by estimation, is 35 to 40%. The left ventricle has mild to moderately decreased function. The left ventricle demonstrates regional wall motion abnormalities (see scoring diagram/findings for description). There is  moderate concentric left ventricular hypertrophy. Left ventricular diastolic parameters are consistent with Grade I diastolic dysfunction (impaired relaxation).  3. Right ventricular  systolic function is normal. The right ventricular size is normal. There is mildly elevated pulmonary artery systolic pressure.  4. Left atrial size was severely dilated.  5. Large pericardial effusion measuring up to 1.8 cm. Large pericardial effusion. The pericardial effusion is circumferential. Findings are consistent with cardiac tamponade. Large pleural effusion in the left lateral region.  6. The mitral valve is normal in structure. Mild mitral valve regurgitation. No evidence of mitral stenosis.  7. The aortic valve is tricuspid. There is mild calcification of the aortic valve. There is mild thickening of the aortic valve. Aortic valve regurgitation is not  visualized. No aortic stenosis is present.  8. The inferior vena cava is dilated in size with <50% respiratory variability, suggesting right atrial pressure of 15 mmHg. FINDINGS  Left Ventricle: Left ventricular ejection fraction, by estimation, is 35 to 40%. The left ventricle has mild to moderately decreased function. The left ventricle demonstrates regional wall motion abnormalities. The left ventricular internal cavity size was normal in size. There is moderate concentric left ventricular hypertrophy. Left ventricular diastolic parameters are consistent with Grade I diastolic dysfunction (impaired relaxation).  LV Wall Scoring: The entire anterior wall, entire lateral wall, inferior septum, and entire inferior wall are hypokinetic. The anterior septum and apex are normal. Right Ventricle: The right ventricular size is normal. No increase in right ventricular wall thickness. Right ventricular systolic function is normal. There is mildly elevated pulmonary artery systolic pressure. The tricuspid regurgitant velocity is 2.52  m/s, and with an assumed right atrial pressure of 15 mmHg, the estimated right ventricular systolic pressure is 24.4 mmHg. Left Atrium: Left atrial size was severely dilated. Right Atrium: Right atrial size was normal in size. Pericardium: Large pericardial effusion measuring up to 1.8 cm. A large pericardial effusion is present. The pericardial effusion is circumferential. There is diastolic collapse of the right ventricular free wall, diastolic collapse of the right atrial wall and excessive respiratory variation in the tricuspid valve spectral Doppler velocities. There is evidence of cardiac tamponade. Mitral Valve: The mitral valve is normal in structure. There is mild thickening of the anterior mitral valve leaflet(s). Mild mitral valve regurgitation. No evidence of mitral valve stenosis. Tricuspid Valve: The tricuspid valve is normal in structure. Tricuspid valve regurgitation is mild .  No evidence of tricuspid stenosis. Aortic Valve: The aortic valve is tricuspid. There is mild calcification of the aortic valve. There is mild thickening of the aortic valve. Aortic valve regurgitation is not visualized. No aortic stenosis is present. Pulmonic Valve: The pulmonic valve was normal in structure. Pulmonic valve regurgitation is mild. No evidence of pulmonic stenosis. Aorta: The aortic root is normal in size and structure. Venous: The inferior vena cava is dilated in size with less than 50% respiratory variability, suggesting right atrial pressure of 15 mmHg. IAS/Shunts: No atrial level shunt detected by color flow Doppler. Additional Comments: Findings are consistent with tamponade. There is a large pleural effusion in the left lateral region.  LEFT VENTRICLE PLAX 2D LVIDd:         4.80 cm      Diastology LVIDs:         4.10 cm      LV e' medial:    4.43 cm/s LV PW:         1.70 cm      LV E/e' medial:  25.5 LV IVS:        1.50 cm      LV e' lateral:   3.90 cm/s  LVOT diam:     1.90 cm      LV E/e' lateral: 29.0 LV SV:         61 LV SV Index:   34 LVOT Area:     2.84 cm  LV Volumes (MOD) LV vol d, MOD A2C: 155.0 ml LV vol d, MOD A4C: 148.0 ml LV vol s, MOD A2C: 72.2 ml LV vol s, MOD A4C: 83.8 ml LV SV MOD A2C:     82.8 ml LV SV MOD A4C:     148.0 ml LV SV MOD BP:      72.7 ml RIGHT VENTRICLE RV S prime:     8.78 cm/s TAPSE (M-mode): 2.2 cm LEFT ATRIUM             Index        RIGHT ATRIUM           Index LA diam:        4.00 cm 2.25 cm/m   RA Area:     16.10 cm LA Vol (A2C):   96.3 ml 54.13 ml/m  RA Volume:   42.30 ml  23.78 ml/m LA Vol (A4C):   77.6 ml 43.62 ml/m LA Biplane Vol: 87.8 ml 49.35 ml/m  AORTIC VALVE LVOT Vmax:   101.00 cm/s LVOT Vmean:  63.800 cm/s LVOT VTI:    0.214 m  AORTA Ao Root diam: 3.50 cm MITRAL VALVE                  TRICUSPID VALVE MV Area (PHT): 4.06 cm       TR Peak grad:   25.4 mmHg MV Decel Time: 187 msec       TR Vmax:        252.00 cm/s MR Peak grad:    132.2 mmHg  MR Mean grad:    82.0 mmHg    SHUNTS MR Vmax:         575.00 cm/s  Systemic VTI:  0.21 m MR Vmean:        415.0 cm/s   Systemic Diam: 1.90 cm MR PISA:         3.08 cm MR PISA Eff ROA: 13 mm MR PISA Radius:  0.70 cm MV E velocity: 113.00 cm/s MV A velocity: 148.00 cm/s MV E/A ratio:  0.76 Skeet Latch MD Electronically signed by Skeet Latch MD Signature Date/Time: 06/17/2022/2:28:32 PM    Final    US RENAL  Result Date: 06/17/2022 CLINICAL DATA:  Acute renal insufficiency. EXAM: RENAL / URINARY TRACT ULTRASOUND COMPLETE COMPARISON:  March 14, 2022. FINDINGS: Right Kidney: Renal measurements: 12.2 x 7.0 x 6.4 cm = volume: 240 mL. Increased echogenicity of renal parenchyma is noted. 1.3 cm simple cyst is seen in upper pole. No mass or hydronephrosis visualized. Left Kidney: Renal measurements: 12.4 x 6.6 x 6.3 cm = volume: 269 mL. Increased echogenicity of renal parenchyma is noted. No mass or hydronephrosis visualized. Bladder: Appears normal for degree of bladder distention. Other: None. IMPRESSION: Increased echogenicity of renal parenchyma is noted bilaterally consistent with medical renal disease. No hydronephrosis or renal obstruction is noted. Electronically Signed   By: Marijo Conception M.D.   On: 06/17/2022 13:57   DG Chest Port 1 View  Result Date: 06/16/2022 CLINICAL DATA:  Shortness of breath EXAM: PORTABLE CHEST 1 VIEW COMPARISON:  April 30, 2022 FINDINGS: Mediastinal contour is normal. The heart size is enlarged. Patchy consolidation of bilateral lung bases with bilateral pleural effusions are noted. Mild increased  pulmonary interstitium is identified bilaterally. The visualized skeletal structures are unremarkable. IMPRESSION: 1. Congestive heart failure. 2. Patchy consolidation of bilateral lung bases with bilateral pleural effusions. Electronically Signed   By: Abelardo Diesel M.D.   On: 06/16/2022 10:22      Recent Lab Findings: Lab Results  Component Value Date   WBC 6.1  06/17/2022   HGB 7.8 (L) 06/17/2022   HCT 24.4 (L) 06/17/2022   PLT 303 06/17/2022   GLUCOSE 105 (H) 06/17/2022   CHOL 226 (H) 01/28/2022   TRIG 194 (H) 01/28/2022   HDL 44 01/28/2022   LDLCALC 143 (H) 01/28/2022   ALT 14 06/16/2022   AST 19 06/16/2022   NA 141 06/17/2022   K 3.7 06/17/2022   CL 116 (H) 06/17/2022   CREATININE 5.53 (H) 06/17/2022   BUN 56 (H) 06/17/2022   CO2 17 (L) 06/17/2022   TSH 1.694 01/29/2022   INR 1.1 05/14/2022   HGBA1C 9.7 (H) 01/28/2022      Assessment / Plan:   History of 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  Procedure details including location of the surgical incision, the use of pericardial drain, the expected postoperative care in the ICU, and the potential risks of bleeding and infection and arrhythmia discussed with patient.  She agrees to proceed with surgery.

## 2022-06-17 NOTE — Progress Notes (Signed)
*  PRELIMINARY RESULTS* Echocardiogram 2D Echocardiogram has been performed.  Amber Cross 06/17/2022, 2:07 PM

## 2022-06-17 NOTE — Progress Notes (Signed)
HD#1 Subjective:   Summary: This is a 61 year old female with past medical history of heart failure with reduced ejection fraction, hypertension, type 2 diabetes with neuropathy, small cell lung cancer, and gout who presented with concerns of shortness of breath.  Patient found to be in severe symptomatic hypertension, admitted for further work-up.  Overnight Events: No overnight events  Patient reports that she is doing better.  She does report having a right-sided dull headache as well as blurry vision for the past month.  Patient denies any chest pain or shortness of breath.  She otherwise states that she is doing fine.  Patient does note that she uses oxygen at home, and uses 2 L.  Patient does report that she did not urinate last night.  Objective:  Vital signs in last 24 hours: Vitals:   06/17/22 1040 06/17/22 1155 06/17/22 1200 06/17/22 1210  BP: (!) 165/94 (!) 181/88 (!) 176/98 (!) 185/88  Pulse: 83     Resp: 17     Temp: 97.9 F (36.6 C)     TempSrc: Oral     SpO2: 95%     Weight:      Height:       Supplemental O2: Nasal Cannula SpO2: 95 % O2 Flow Rate (L/min): 2 L/min FiO2 (%): 40 %   Physical Exam:  Constitutional: Well-appearing, lying in bed comfortably, in no acute distress HENT: Dry mucous membranes, normocephalic, atraumatic Eyes: conjunctiva non-erythematous Cardiovascular: regular rate and rhythm, no m/r/g Pulmonary/Chest: Decreased breath sounds at right lung base.  Clear to auscultation on left side. Abdominal: soft, non-tender, non-distended.  Normoactive bowel sounds MSK: normal bulk and tone Neurological: alert & oriented x 3, 5/5 strength in bilateral upper and lower extremities, normal gait Extremities: Trace edema to bilateral lower extremities  Filed Weights   06/16/22 1644 06/17/22 0007 06/17/22 0500  Weight: 65 kg 67.1 kg 67.1 kg     Intake/Output Summary (Last 24 hours) at 06/17/2022 1226 Last data filed at 06/17/2022 0839 Gross per  24 hour  Intake 144.41 ml  Output 200 ml  Net -55.59 ml   Net IO Since Admission: -55.59 mL [06/17/22 1226]  Pertinent Labs:    Latest Ref Rng & Units 06/17/2022    8:34 AM 06/16/2022   10:12 AM 06/16/2022    9:55 AM  CBC  WBC 4.0 - 10.5 K/uL 6.1   11.1   Hemoglobin 12.0 - 15.0 g/dL 7.8  10.5  10.0   Hematocrit 36.0 - 46.0 % 24.4  31.0  31.8   Platelets 150 - 400 K/uL 303   440        Latest Ref Rng & Units 06/17/2022    8:34 AM 06/16/2022   10:12 AM 06/16/2022    9:55 AM  CMP  Glucose 70 - 99 mg/dL 105   211   BUN 6 - 20 mg/dL 56   55   Creatinine 0.44 - 1.00 mg/dL 5.53   5.46   Sodium 135 - 145 mmol/L 141  142  142   Potassium 3.5 - 5.1 mmol/L 3.7  3.8  3.8   Chloride 98 - 111 mmol/L 116   116   CO2 22 - 32 mmol/L 17   18   Calcium 8.9 - 10.3 mg/dL 7.6   8.0   Total Protein 6.5 - 8.1 g/dL   6.6   Total Bilirubin 0.3 - 1.2 mg/dL   0.4   Alkaline Phos 38 - 126 U/L   194  AST 15 - 41 U/L   19   ALT 0 - 44 U/L   14     Imaging: No results found.  Assessment/Plan:   Principal Problem:   Hypertensive emergency Active Problems:   Type 2 diabetes mellitus with diabetic polyneuropathy (HCC)   Acute on chronic systolic congestive heart failure (HCC)   AKI (acute kidney injury) ([place])   Patient Summary: Amber Cross is a 61 y.o.  female with past medical history of heart failure with reduced ejection fraction, hypertension, type 2 diabetes with neuropathy, small cell lung cancer, and gout who presented with concerns of shortness of breath.  Patient found to be in severe symptomatic hypertension, admitted for further work-up.  #Severe symptomatic hypertension #Acute heart failure exacerbation #Acute on chronic kidney disease #Acute on chronic hypoxemic respiratory failure Patient remains on 2 L oxygen, this is her home dose.  This morning, patient denies any shortness of breath, and states that she is feeling better.  Patient does report having a headache, this  could be attributed to nitroglycerin.  She endorses month-long history of blurry vision, in which she does wear corrective lenses.  Patient has had 80 mg IV Lasix administered yesterday, and patient has only had about 250 mL of output.  Patient reports that she not use the restroom overnight.  BMP this morning showed elevated creatinine at 5.53 elevated from 5.46 yesterday.  Blood pressure is trending down into the 140s to 150s now.  On exam, patient has decreased lung sounds at right lung base, and patient has trace edema.  Patient likely still needs fluid off, in the setting of acute renal injury, her urine output has been decreased. -Obtain renal ultrasound -Echo pending -Trend BMP -Obtain bladder scan -Transition off nitro drip to home meds, including amlodipine 10 mg daily -Imdur 30 mg daily -Carvedilol 25 mg twice daily -Hydralazine 50 mg 3 times daily -Tylenol as needed for headache, taking some Fioricet for headache -Continue trending blood pressure -Strict I's and O's and daily weights -Continue diuresing  #Type 2 diabetes with neuropathy Patient has history of type 2 diabetes mellitus with neuropathy.  Patient is currently on sliding scale insulin.  Blood sugars ranging between 90-1 23.  This is at goal -Continue sliding scale insulin  #Hyperlipidemia Patient has a past medical history of hyperlipidemia.  Patient is continued on Crestor 40 mg daily -Continue Crestor 40 mg daily  #Small cell lung cancer Patient has past medical history of small cell lung cancer diagnosed on 11/2021.  Patient was due to have surveillance imaging with CT chest and MRI of brain at the end of this month, will obtain itching here. Plan: -CT chest pending -MRI brain pending  #Gout No concern for acute flare at this time.  Continue monitoring  Diet: Heart Healthy, carb modified, 1500 mL fluid restriction IVF: None,None VTE: Heparin Code: Full   Dispo: Anticipated discharge to Home in 2 days  pending clinical improvement.   [facility] Resident PGY-1 [phone] Please contact the on call pager after 5 pm and on weekends at [phone].
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
## 2022-06-18 ENCOUNTER — Encounter (HOSPITAL_COMMUNITY)
Admission: EM | Disposition: A | Discharge status: Home / Self Care | Payer: Self-pay | Source: Home / Self Care | Attending: Cardiothoracic Surgery

## 2022-06-18 ENCOUNTER — Inpatient Hospital Stay (HOSPITAL_COMMUNITY): Payer: Commercial Managed Care - HMO

## 2022-06-18 ENCOUNTER — Other Ambulatory Visit: Payer: Self-pay

## 2022-06-18 ENCOUNTER — Inpatient Hospital Stay (HOSPITAL_COMMUNITY): Payer: Commercial Managed Care - HMO | Admitting: Anesthesiology

## 2022-06-18 DIAGNOSIS — I3139 Other pericardial effusion (noninflammatory): Secondary | ICD-10-CM | POA: Diagnosis not present

## 2022-06-18 DIAGNOSIS — I509 Heart failure, unspecified: Secondary | ICD-10-CM

## 2022-06-18 DIAGNOSIS — I314 Cardiac tamponade: Secondary | ICD-10-CM | POA: Diagnosis present

## 2022-06-18 DIAGNOSIS — F17210 Nicotine dependence, cigarettes, uncomplicated: Secondary | ICD-10-CM

## 2022-06-18 DIAGNOSIS — I110 Hypertensive heart disease with heart failure: Secondary | ICD-10-CM

## 2022-06-18 HISTORY — DX: Cardiac tamponade: I31.4

## 2022-06-18 HISTORY — PX: SUBXYPHOID PERICARDIAL WINDOW: SHX5075

## 2022-06-18 HISTORY — PX: CHEST TUBE INSERTION: SHX231

## 2022-06-18 HISTORY — PX: TEE WITHOUT CARDIOVERSION: SHX5443

## 2022-06-18 LAB — GLUCOSE, CAPILLARY
Glucose-Capillary: 81 mg/dL (ref 70–99)
Glucose-Capillary: 90 mg/dL (ref 70–99)
Glucose-Capillary: 86 mg/dL (ref 70–99)
Glucose-Capillary: 110 mg/dL — ABNORMAL HIGH (ref 70–99)
Glucose-Capillary: 138 mg/dL — ABNORMAL HIGH (ref 70–99)
Glucose-Capillary: 107 mg/dL — ABNORMAL HIGH (ref 70–99)

## 2022-06-18 LAB — CBC WITH DIFFERENTIAL/PLATELET
Abs Immature Granulocytes: 0.01 10*3/uL (ref 0.00–0.07)
Basophils Absolute: 0 10*3/uL (ref 0.0–0.1)
Basophils Relative: 1 %
Eosinophils Absolute: 0.1 10*3/uL (ref 0.0–0.5)
Eosinophils Relative: 1 %
HCT: 29.5 % — ABNORMAL LOW (ref 36.0–46.0)
Hemoglobin: 10 g/dL — ABNORMAL LOW (ref 12.0–15.0)
Immature Granulocytes: 0 %
Lymphocytes Relative: 14 %
Lymphs Abs: 1 10*3/uL (ref 0.7–4.0)
MCH: 28.8 pg (ref 26.0–34.0)
MCHC: 33.9 g/dL (ref 30.0–36.0)
MCV: 85 fL (ref 80.0–100.0)
Monocytes Absolute: 0.5 10*3/uL (ref 0.1–1.0)
Monocytes Relative: 7 %
Neutro Abs: 5.4 10*3/uL (ref 1.7–7.7)
Neutrophils Relative %: 77 %
Platelets: 266 10*3/uL (ref 150–400)
RBC: 3.47 MIL/uL — ABNORMAL LOW (ref 3.87–5.11)
RDW: 16.3 % — ABNORMAL HIGH (ref 11.5–15.5)
WBC: 7 10*3/uL (ref 4.0–10.5)
nRBC: 0 % (ref 0.0–0.2)

## 2022-06-18 LAB — POCT I-STAT 7, (LYTES, BLD GAS, ICA,H+H)
Acid-base deficit: 11 mmol/L — ABNORMAL HIGH (ref 0.0–2.0)
Acid-base deficit: 9 mmol/L — ABNORMAL HIGH (ref 0.0–2.0)
Acid-base deficit: 9 mmol/L — ABNORMAL HIGH (ref 0.0–2.0)
Bicarbonate: 16.8 mmol/L — ABNORMAL LOW (ref 20.0–28.0)
Bicarbonate: 18.8 mmol/L — ABNORMAL LOW (ref 20.0–28.0)
Bicarbonate: 18.8 mmol/L — ABNORMAL LOW (ref 20.0–28.0)
Calcium, Ion: 1.12 mmol/L — ABNORMAL LOW (ref 1.15–1.40)
Calcium, Ion: 1.15 mmol/L (ref 1.15–1.40)
Calcium, Ion: 1.15 mmol/L (ref 1.15–1.40)
HCT: 25 % — ABNORMAL LOW (ref 36.0–46.0)
HCT: 31 % — ABNORMAL LOW (ref 36.0–46.0)
HCT: 32 % — ABNORMAL LOW (ref 36.0–46.0)
Hemoglobin: 8.5 g/dL — ABNORMAL LOW (ref 12.0–15.0)
Hemoglobin: 10.5 g/dL — ABNORMAL LOW (ref 12.0–15.0)
Hemoglobin: 10.9 g/dL — ABNORMAL LOW (ref 12.0–15.0)
O2 Saturation: 90 %
O2 Saturation: 98 %
O2 Saturation: 96 %
Patient temperature: 37
Patient temperature: 95
Potassium: 3.5 mmol/L (ref 3.5–5.1)
Potassium: 4 mmol/L (ref 3.5–5.1)
Potassium: 3.8 mmol/L (ref 3.5–5.1)
Sodium: 141 mmol/L (ref 135–145)
Sodium: 141 mmol/L (ref 135–145)
Sodium: 141 mmol/L (ref 135–145)
TCO2: 18 mmol/L — ABNORMAL LOW (ref 22–32)
TCO2: 20 mmol/L — ABNORMAL LOW (ref 22–32)
TCO2: 20 mmol/L — ABNORMAL LOW (ref 22–32)
pCO2 arterial: 43.2 mmHg (ref 32–48)
pCO2 arterial: 45.4 mmHg (ref 32–48)
pCO2 arterial: 41.9 mmHg (ref 32–48)
pH, Arterial: 7.199 — CL (ref 7.35–7.45)
pH, Arterial: 7.225 — ABNORMAL LOW (ref 7.35–7.45)
pH, Arterial: 7.249 — ABNORMAL LOW (ref 7.35–7.45)
pO2, Arterial: 73 mmHg — ABNORMAL LOW (ref 83–108)
pO2, Arterial: 117 mmHg — ABNORMAL HIGH (ref 83–108)
pO2, Arterial: 89 mmHg (ref 83–108)

## 2022-06-18 LAB — RENAL FUNCTION PANEL
Albumin: 1.7 g/dL — ABNORMAL LOW (ref 3.5–5.0)
Anion gap: 13 (ref 5–15)
BUN: 54 mg/dL — ABNORMAL HIGH (ref 6–20)
CO2: 18 mmol/L — ABNORMAL LOW (ref 22–32)
Calcium: 7.5 mg/dL — ABNORMAL LOW (ref 8.9–10.3)
Chloride: 110 mmol/L (ref 98–111)
Creatinine, Ser: 5.22 mg/dL — ABNORMAL HIGH (ref 0.44–1.00)
GFR, Estimated: 9 mL/min — ABNORMAL LOW (ref >60)
Glucose, Bld: 134 mg/dL — ABNORMAL HIGH (ref 70–99)
Phosphorus: 7 mg/dL — ABNORMAL HIGH (ref 2.5–4.6)
Potassium: 3.8 mmol/L (ref 3.5–5.1)
Sodium: 141 mmol/L (ref 135–145)

## 2022-06-18 LAB — PREPARE RBC (CROSSMATCH)

## 2022-06-18 LAB — BASIC METABOLIC PANEL
Anion gap: 13 (ref 5–15)
BUN: 53 mg/dL — ABNORMAL HIGH (ref 6–20)
CO2: 16 mmol/L — ABNORMAL LOW (ref 22–32)
Calcium: 7.9 mg/dL — ABNORMAL LOW (ref 8.9–10.3)
Chloride: 111 mmol/L (ref 98–111)
Creatinine, Ser: 5.15 mg/dL — ABNORMAL HIGH (ref 0.44–1.00)
GFR, Estimated: 9 mL/min — ABNORMAL LOW (ref >60)
Glucose, Bld: 87 mg/dL (ref 70–99)
Potassium: 3.7 mmol/L (ref 3.5–5.1)
Sodium: 140 mmol/L (ref 135–145)

## 2022-06-18 LAB — SURGICAL PCR SCREEN
MRSA, PCR: NEGATIVE
Staphylococcus aureus: NEGATIVE

## 2022-06-18 LAB — ECHO INTRAOPERATIVE TEE
Height: 67 in
Weight: 2321 oz

## 2022-06-18 SURGERY — CREATION, PERICARDIAL WINDOW, SUBXIPHOID APPROACH
Anesthesia: General | Site: Chest | Laterality: Right

## 2022-06-18 MED ORDER — ETOMIDATE 2 MG/ML IV SOLN
INTRAVENOUS | Status: AC
  Filled 2022-06-18: qty 10

## 2022-06-18 MED ORDER — SODIUM BICARBONATE 8.4 % IV SOLN
INTRAVENOUS | Status: AC
  Administered 2022-06-18: 50 meq via INTRAVENOUS
  Filled 2022-06-18: qty 50

## 2022-06-18 MED ORDER — ONDANSETRON HCL 4 MG/2ML IJ SOLN
INTRAMUSCULAR | Status: AC
  Filled 2022-06-18: qty 2

## 2022-06-18 MED ORDER — SODIUM BICARBONATE 8.4 % IV SOLN
INTRAVENOUS | Status: AC
  Filled 2022-06-18: qty 50

## 2022-06-18 MED ORDER — SODIUM BICARBONATE 8.4 % IV SOLN: 50.0000 meq | Freq: Once | INTRAVENOUS | Status: AC

## 2022-06-18 MED ORDER — ONDANSETRON HCL 4 MG/2ML IJ SOLN
INTRAMUSCULAR | Status: DC | PRN
  Administered 2022-06-18: 4 mg via INTRAVENOUS

## 2022-06-18 MED ORDER — LACTATED RINGERS IV SOLN: INTRAVENOUS | Status: DC | PRN

## 2022-06-18 MED ORDER — PROPOFOL 10 MG/ML IV BOLUS
INTRAVENOUS | Status: AC
  Filled 2022-06-18: qty 20

## 2022-06-18 MED ORDER — CARVEDILOL 12.5 MG PO TABS
ORAL_TABLET | ORAL | Status: AC
  Filled 2022-06-18: qty 1

## 2022-06-18 MED ORDER — ENOXAPARIN SODIUM 40 MG/0.4ML IJ SOSY: 40.0000 mg | PREFILLED_SYRINGE | Freq: Every day | INTRAMUSCULAR | Status: DC

## 2022-06-18 MED ORDER — SODIUM CHLORIDE 0.45 % IV SOLN: INTRAVENOUS | Status: DC

## 2022-06-18 MED ORDER — FENTANYL CITRATE (PF) 250 MCG/5ML IJ SOLN
INTRAMUSCULAR | Status: DC | PRN
  Administered 2022-06-18 (×4): 50 ug via INTRAVENOUS

## 2022-06-18 MED ORDER — DEXAMETHASONE SODIUM PHOSPHATE 10 MG/ML IJ SOLN
INTRAMUSCULAR | Status: DC | PRN
  Administered 2022-06-18: 8 mg via INTRAVENOUS

## 2022-06-18 MED ORDER — ALBUMIN HUMAN 25 % IV SOLN
12.5000 g | Freq: Four times a day (QID) | INTRAVENOUS | Status: AC
  Administered 2022-06-18 – 2022-06-19 (×4): 12.5 g via INTRAVENOUS
  Filled 2022-06-18 (×4): qty 50

## 2022-06-18 MED ORDER — BISACODYL 5 MG PO TBEC
10.0000 mg | DELAYED_RELEASE_TABLET | Freq: Every day | ORAL | Status: DC
  Administered 2022-06-19 – 2022-07-01 (×8): 10 mg via ORAL
  Filled 2022-06-18 (×10): qty 2

## 2022-06-18 MED ORDER — EPHEDRINE 5 MG/ML INJ
INTRAVENOUS | Status: AC
  Filled 2022-06-18: qty 5

## 2022-06-18 MED ORDER — ETOMIDATE 2 MG/ML IV SOLN
INTRAVENOUS | Status: DC | PRN
  Administered 2022-06-18: 20 mg via INTRAVENOUS

## 2022-06-18 MED ORDER — PANTOPRAZOLE SODIUM 40 MG PO TBEC
40.0000 mg | DELAYED_RELEASE_TABLET | Freq: Every day | ORAL | Status: DC
  Administered 2022-06-18 – 2022-07-02 (×15): 40 mg via ORAL
  Filled 2022-06-18 (×15): qty 1

## 2022-06-18 MED ORDER — EPHEDRINE SULFATE-NACL 50-0.9 MG/10ML-% IV SOSY
PREFILLED_SYRINGE | INTRAVENOUS | Status: DC | PRN
  Administered 2022-06-18: 5 mg via INTRAVENOUS

## 2022-06-18 MED ORDER — LIDOCAINE 2% (20 MG/ML) 5 ML SYRINGE
INTRAMUSCULAR | Status: AC
  Filled 2022-06-18: qty 5

## 2022-06-18 MED ORDER — FENTANYL CITRATE (PF) 100 MCG/2ML IJ SOLN
25.0000 ug | INTRAMUSCULAR | Status: DC | PRN
  Administered 2022-06-18 (×2): 50 ug via INTRAVENOUS

## 2022-06-18 MED ORDER — 0.9 % SODIUM CHLORIDE (POUR BTL) OPTIME
TOPICAL | Status: DC | PRN
  Administered 2022-06-18: 1000 mL

## 2022-06-18 MED ORDER — FENTANYL CITRATE (PF) 100 MCG/2ML IJ SOLN
INTRAMUSCULAR | Status: AC
  Filled 2022-06-18: qty 2

## 2022-06-18 MED ORDER — HYDROMORPHONE HCL 1 MG/ML IJ SOLN
0.5000 mg | INTRAMUSCULAR | Status: DC | PRN
  Administered 2022-06-18 – 2022-06-19 (×2): 1 mg via INTRAVENOUS
  Filled 2022-06-18 (×2): qty 1

## 2022-06-18 MED ORDER — SENNOSIDES-DOCUSATE SODIUM 8.6-50 MG PO TABS
1.0000 | ORAL_TABLET | Freq: Every day | ORAL | Status: DC
  Administered 2022-06-20 – 2022-06-29 (×8): 1 via ORAL
  Filled 2022-06-18 (×12): qty 1

## 2022-06-18 MED ORDER — CEFAZOLIN SODIUM-DEXTROSE 1-4 GM/50ML-% IV SOLN
1.0000 g | Freq: Two times a day (BID) | INTRAVENOUS | Status: AC
  Administered 2022-06-18: 1 g via INTRAVENOUS
  Filled 2022-06-18: qty 50

## 2022-06-18 MED ORDER — HYDRALAZINE HCL 50 MG PO TABS
75.0000 mg | ORAL_TABLET | Freq: Three times a day (TID) | ORAL | Status: DC
  Administered 2022-06-19: 75 mg via ORAL
  Filled 2022-06-18: qty 1

## 2022-06-18 MED ORDER — LIDOCAINE 2% (20 MG/ML) 5 ML SYRINGE
INTRAMUSCULAR | Status: DC | PRN
  Administered 2022-06-18: 60 mg via INTRAVENOUS

## 2022-06-18 MED ORDER — CHLORHEXIDINE GLUCONATE CLOTH 2 % EX PADS
6.0000 | MEDICATED_PAD | Freq: Every day | CUTANEOUS | Status: DC
  Administered 2022-06-18 – 2022-06-26 (×9): 6 via TOPICAL

## 2022-06-18 MED ORDER — ISOSORBIDE MONONITRATE ER 30 MG PO TB24
30.0000 mg | ORAL_TABLET | Freq: Every day | ORAL | Status: DC
  Administered 2022-06-19: 30 mg via ORAL
  Filled 2022-06-18: qty 1

## 2022-06-18 MED ORDER — PROPOFOL 10 MG/ML IV BOLUS
INTRAVENOUS | Status: DC | PRN
  Administered 2022-06-18: 50 mg via INTRAVENOUS

## 2022-06-18 MED ORDER — ATORVASTATIN CALCIUM 80 MG PO TABS
80.0000 mg | ORAL_TABLET | Freq: Every day | ORAL | Status: DC
  Administered 2022-06-19 – 2022-07-02 (×14): 80 mg via ORAL
  Filled 2022-06-18 (×14): qty 1

## 2022-06-18 MED ORDER — FENTANYL CITRATE (PF) 250 MCG/5ML IJ SOLN
INTRAMUSCULAR | Status: AC
  Filled 2022-06-18: qty 5

## 2022-06-18 MED ORDER — ACETAMINOPHEN 500 MG PO TABS
1000.0000 mg | ORAL_TABLET | Freq: Four times a day (QID) | ORAL | Status: AC
  Administered 2022-06-18 – 2022-06-22 (×13): 1000 mg via ORAL
  Filled 2022-06-18 (×15): qty 2

## 2022-06-18 MED ORDER — MIDAZOLAM HCL 2 MG/2ML IJ SOLN
INTRAMUSCULAR | Status: DC | PRN
  Administered 2022-06-18: 2 mg via INTRAVENOUS

## 2022-06-18 MED ORDER — ONDANSETRON HCL 4 MG/2ML IJ SOLN
4.0000 mg | Freq: Four times a day (QID) | INTRAMUSCULAR | Status: DC
  Filled 2022-06-18: qty 2

## 2022-06-18 MED ORDER — ACETAMINOPHEN 160 MG/5ML PO SOLN: 1000.0000 mg | Freq: Four times a day (QID) | ORAL | Status: AC

## 2022-06-18 MED ORDER — ENOXAPARIN SODIUM 30 MG/0.3ML IJ SOSY
30.0000 mg | PREFILLED_SYRINGE | Freq: Every day | INTRAMUSCULAR | Status: DC
  Administered 2022-06-19: 30 mg via SUBCUTANEOUS
  Filled 2022-06-18: qty 0.3

## 2022-06-18 MED ORDER — SODIUM BICARBONATE 8.4 % IV SOLN
INTRAVENOUS | Status: DC | PRN
  Administered 2022-06-18: 75 meq via INTRAVENOUS

## 2022-06-18 MED ORDER — ROCURONIUM BROMIDE 10 MG/ML (PF) SYRINGE
PREFILLED_SYRINGE | INTRAVENOUS | Status: AC
  Filled 2022-06-18: qty 10

## 2022-06-18 MED ORDER — PHENYLEPHRINE HCL-NACL 20-0.9 MG/250ML-% IV SOLN
INTRAVENOUS | Status: DC | PRN
  Administered 2022-06-18: 50 ug/min via INTRAVENOUS

## 2022-06-18 MED ORDER — OXYCODONE HCL 5 MG PO TABS: 5.0000 mg | ORAL_TABLET | Freq: Once | ORAL | Status: DC | PRN

## 2022-06-18 MED ORDER — ONDANSETRON HCL 4 MG/2ML IJ SOLN
4.0000 mg | Freq: Four times a day (QID) | INTRAMUSCULAR | Status: DC | PRN
  Administered 2022-06-18 – 2022-06-29 (×7): 4 mg via INTRAVENOUS
  Filled 2022-06-18 (×8): qty 2

## 2022-06-18 MED ORDER — ROCURONIUM BROMIDE 10 MG/ML (PF) SYRINGE
PREFILLED_SYRINGE | INTRAVENOUS | Status: DC | PRN
  Administered 2022-06-18: 50 mg via INTRAVENOUS

## 2022-06-18 MED ORDER — MIDAZOLAM HCL 2 MG/2ML IJ SOLN
INTRAMUSCULAR | Status: AC
  Filled 2022-06-18: qty 2

## 2022-06-18 MED ORDER — LABETALOL HCL 5 MG/ML IV SOLN
10.0000 mg | INTRAVENOUS | Status: DC | PRN
  Administered 2022-06-18: 10 mg via INTRAVENOUS
  Filled 2022-06-18: qty 4

## 2022-06-18 MED ORDER — DEXAMETHASONE SODIUM PHOSPHATE 10 MG/ML IJ SOLN
INTRAMUSCULAR | Status: AC
  Filled 2022-06-18: qty 1

## 2022-06-18 MED ORDER — CARVEDILOL 12.5 MG PO TABS
ORAL_TABLET | ORAL | Status: AC
  Administered 2022-06-18: 25 mg via ORAL
  Filled 2022-06-18: qty 1

## 2022-06-18 MED ORDER — ROSUVASTATIN CALCIUM 20 MG PO TABS: 40.0000 mg | ORAL_TABLET | Freq: Every day | ORAL | Status: DC

## 2022-06-18 MED ORDER — HYDRALAZINE HCL 50 MG PO TABS: 50.0000 mg | ORAL_TABLET | Freq: Three times a day (TID) | ORAL | Status: DC

## 2022-06-18 MED ORDER — MUPIROCIN 2 % EX OINT: 1.0000 | TOPICAL_OINTMENT | Freq: Two times a day (BID) | CUTANEOUS | Status: DC

## 2022-06-18 MED ORDER — OXYCODONE HCL 5 MG PO TABS
5.0000 mg | ORAL_TABLET | ORAL | Status: DC | PRN
  Administered 2022-06-18 – 2022-06-19 (×3): 10 mg via ORAL
  Administered 2022-06-19: 5 mg via ORAL
  Administered 2022-06-19: 10 mg via ORAL
  Administered 2022-06-20: 5 mg via ORAL
  Administered 2022-06-21: 10 mg via ORAL
  Administered 2022-06-21: 5 mg via ORAL
  Administered 2022-06-22 – 2022-06-24 (×3): 10 mg via ORAL
  Filled 2022-06-18 (×5): qty 2
  Filled 2022-06-18: qty 1
  Filled 2022-06-18 (×3): qty 2
  Filled 2022-06-18 (×2): qty 1

## 2022-06-18 MED ORDER — MORPHINE SULFATE (PF) 2 MG/ML IV SOLN: 2.0000 mg | INTRAVENOUS | Status: DC | PRN

## 2022-06-18 MED ORDER — OXYCODONE HCL 5 MG/5ML PO SOLN: 5.0000 mg | Freq: Once | ORAL | Status: DC | PRN

## 2022-06-18 MED ORDER — SUGAMMADEX SODIUM 200 MG/2ML IV SOLN
INTRAVENOUS | Status: DC | PRN
  Administered 2022-06-18: 200 mg via INTRAVENOUS

## 2022-06-18 SURGICAL SUPPLY — 49 items
APL SKNCLS STERI-STRIP NONHPOA (GAUZE/BANDAGES/DRESSINGS) ×2
BENZOIN TINCTURE PRP APPL 2/3 (GAUZE/BANDAGES/DRESSINGS) ×3 IMPLANT
BLADE CLIPPER SURG (BLADE) ×3 IMPLANT
CANISTER SUCT 3000ML PPV (MISCELLANEOUS) ×3 IMPLANT
CATH THORACIC 28FR (CATHETERS) IMPLANT
CATH THORACIC 28FR RT ANG (CATHETERS) IMPLANT
CATH THORACIC 36FR (CATHETERS) IMPLANT
CNTNR URN SCR LID CUP LEK RST (MISCELLANEOUS) IMPLANT
CONT SPEC 4OZ STRL OR WHT (MISCELLANEOUS) ×2
CONTAINER PROTECT SURGISLUSH (MISCELLANEOUS) ×6 IMPLANT
COVER SURGICAL LIGHT HANDLE (MISCELLANEOUS) ×6 IMPLANT
DRAIN CHANNEL 28F RND 3/8 FF (WOUND CARE) ×3 IMPLANT
DRAPE HALF SHEET 40X57 (DRAPES) ×3 IMPLANT
DRAPE LAPAROSCOPIC ABDOMINAL (DRAPES) ×3 IMPLANT
ELECT REM PT RETURN 9FT ADLT (ELECTROSURGICAL) ×2
ELECTRODE REM PT RTRN 9FT ADLT (ELECTROSURGICAL) ×3 IMPLANT
GAUZE 4X4 16PLY ~~LOC~~+RFID DBL (SPONGE) ×3 IMPLANT
GAUZE SPONGE 4X4 12PLY STRL (GAUZE/BANDAGES/DRESSINGS) IMPLANT
GLOVE BIO SURGEON STRL SZ7.5 (GLOVE) ×6 IMPLANT
HEMOSTAT POWDER SURGIFOAM 1G (HEMOSTASIS) IMPLANT
KIT BASIN OR (CUSTOM PROCEDURE TRAY) ×3 IMPLANT
KIT SUCTION CATH 14FR (SUCTIONS) IMPLANT
KIT TURNOVER KIT B (KITS) ×3 IMPLANT
NS IRRIG 1000ML POUR BTL (IV SOLUTION) ×3 IMPLANT
PACK CHEST (CUSTOM PROCEDURE TRAY) ×3 IMPLANT
PAD ARMBOARD 7.5X6 YLW CONV (MISCELLANEOUS) ×6 IMPLANT
PAD ELECT DEFIB RADIOL ZOLL (MISCELLANEOUS) ×3 IMPLANT
SPONGE T-LAP 18X18 ~~LOC~~+RFID (SPONGE) ×12 IMPLANT
SPONGE T-LAP 4X18 ~~LOC~~+RFID (SPONGE) ×3 IMPLANT
STRIP CLOSURE SKIN 1/2X4 (GAUZE/BANDAGES/DRESSINGS) ×3 IMPLANT
SUT SILK  1 MH (SUTURE) ×2
SUT SILK 1 MH (SUTURE) IMPLANT
SUT SILK 2 0 SH CR/8 (SUTURE) ×3 IMPLANT
SUT VIC AB 1 CTX 18 (SUTURE) ×3 IMPLANT
SUT VIC AB 1 CTX 36 (SUTURE) ×2
SUT VIC AB 1 CTX36XBRD ANBCTR (SUTURE) ×3 IMPLANT
SUT VIC AB 3-0 X1 27 (SUTURE) ×3 IMPLANT
SWAB COLLECTION DEVICE MRSA (MISCELLANEOUS) IMPLANT
SWAB CULTURE ESWAB REG 1ML (MISCELLANEOUS) IMPLANT
SYR 10ML LL (SYRINGE) IMPLANT
SYR 50ML SLIP (SYRINGE) IMPLANT
SYSTEM SAHARA CHEST DRAIN ATS (WOUND CARE) IMPLANT
TAPE CLOTH SURG 4X10 WHT LF (GAUZE/BANDAGES/DRESSINGS) IMPLANT
TOWEL GREEN STERILE (TOWEL DISPOSABLE) ×3 IMPLANT
TOWEL GREEN STERILE FF (TOWEL DISPOSABLE) ×3 IMPLANT
TRAP SPECIMEN MUCUS 40CC (MISCELLANEOUS) IMPLANT
TRAY FOLEY MTR SLVR 14FR STAT (SET/KITS/TRAYS/PACK) ×3 IMPLANT
TRAY FOLEY SLVR 16FR TEMP STAT (SET/KITS/TRAYS/PACK) ×3 IMPLANT
WATER STERILE IRR 1000ML POUR (IV SOLUTION) ×6 IMPLANT

## 2022-06-18 NOTE — Progress Notes (Signed)
Doing well postop. Sore, sleepy after pain meds. Hemodynamically compensated. In sinus rhythm.

## 2022-06-18 NOTE — Anesthesia Procedure Notes (Signed)
Procedure Name: Intubation Date/Time: 06/18/2022 8:25 AM  Performed by: Heide Scales, CRNAPre-anesthesia Checklist: Patient identified, Emergency Drugs available, Suction available and Patient being monitored Patient Re-evaluated:Patient Re-evaluated prior to induction Oxygen Delivery Method: Circle system utilized Preoxygenation: Pre-oxygenation with 100% oxygen Induction Type: IV induction Ventilation: Mask ventilation without difficulty Laryngoscope Size: Mac and 4 Grade View: Grade I Tube type: Oral Tube size: 8.0 mm Number of attempts: 1 Airway Equipment and Method: Stylet Placement Confirmation: ETT inserted through vocal cords under direct vision, positive ETCO2 and breath sounds checked- equal and bilateral Secured at: 23 cm Tube secured with: Tape Dental Injury: Teeth and Oropharynx as per pre-operative assessment

## 2022-06-18 NOTE — Anesthesia Procedure Notes (Signed)
Arterial Line Insertion Start/End11/21/2023 7:00 AM, 06/18/2022 7:05 AM Performed by: Amadeo Garnet, CRNA, CRNA  Patient location: Pre-op. Preanesthetic checklist: patient identified, IV checked, site marked, risks and benefits discussed, surgical consent, monitors and equipment checked, pre-op evaluation, timeout performed and anesthesia consent Lidocaine 1% used for infiltration Left, radial was placed Catheter size: 20 G Hand hygiene performed , maximum sterile barriers used  and Seldinger technique used Allen's test indicative of satisfactory collateral circulation Attempts: 1 Procedure performed without using ultrasound guided technique. Following insertion, dressing applied. Post procedure assessment: normal and unchanged  Patient tolerated the procedure well with no immediate complications.

## 2022-06-18 NOTE — Progress Notes (Signed)
EVENING ROUNDS NOTE :     [address]       [place] [address]             [phone]                 Day of Surgery Procedure(s) (LRB): SUBXYPHOID PERICARDIAL WINDOW (N/A) TRANSESOPHAGEAL ECHOCARDIOGRAM (TEE) (N/A) CHEST TUBE INSERTION (Right)   Total Length of Stay:  LOS: 2 days  Events:   No events Resting     BP 127/88 (BP Location: Left Arm)   Pulse 80   Temp (!) 94.2 F (34.6 C) (Rectal)   Resp 10   Ht 5\' 7"  (1.702 m)   Wt 65.8 kg   LMP 09/29/2011   SpO2 99%   BMI 22.72 kg/m          sodium chloride 75 mL/hr at 06/18/22 1633   albumin human 12.5 g (06/18/22 1633)    ceFAZolin (ANCEF) IV      I/O last 3 completed shifts: In: 504 [P.O.:120; Blood:384] Out: 200 [Urine:200]      Latest Ref Rng & Units 06/18/2022    4:45 PM 06/18/2022   12:06 PM 06/18/2022    8:55 AM  CBC  Hemoglobin 12.0 - 15.0 g/dL 10.9  10.5  8.5   Hematocrit 36.0 - 46.0 % 32.0  31.0  25.0        Latest Ref Rng & Units 06/18/2022    4:45 PM 06/18/2022   12:06 PM 06/18/2022    8:55 AM  BMP  Sodium 135 - 145 mmol/L 141  141  141   Potassium 3.5 - 5.1 mmol/L 3.8  4.0  3.5     ABG    Component Value Date/Time   PHART 7.249 (L) 06/18/2022 1645   PCO2ART 41.9 06/18/2022 1645   PO2ART 89 06/18/2022 1645   HCO3 18.8 (L) 06/18/2022 1645   TCO2 20 (L) 06/18/2022 1645   ACIDBASEDEF 9.0 (H) 06/18/2022 1645   O2SAT 96 06/18/2022 1645       Yavuz Kirby, MD 06/18/2022 5:21 PM

## 2022-06-18 NOTE — Op Note (Signed)
Amber Cross, Amber Cross MEDICAL RECORD NO: [id number] ACCOUNT NO: [id number] DATE OF BIRTH: [birth year] FACILITY: [facility] LOCATION: [facility] PHYSICIAN: Ivin Poot III, MD  Operative Report   DATE OF PROCEDURE: 06/18/2022  OPERATION: 1.  Subxiphoid pericardial window for pericardial effusion with pre-tamponade. 2.  Placement of right chest tube for drainage of moderate pleural effusion.  SURGEON:  Ivin Poot, MD  ASSISTANT:  Wynelle Beckmann, PA-C  PREOPERATIVE DIAGNOSES: 1.  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 2.  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 3.  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 4.  History of smoking and chronic obstructive pulmonary disease, on home oxygen.  POSTOPERATIVE DIAGNOSES: 1.  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 2.  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 3.  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 4.  History of smoking and chronic obstructive pulmonary disease, on home oxygen.  ANESTHESIA:  General.  DESCRIPTION OF PROCEDURE:  The patient was examined in preoperative holding where informed consent was documented and the proper site of surgery was documented with a mark on the patient's right chest.  All questions were addressed regarding the  procedure, which had been fully discussed with the patient and family the previous evening which included discussion of the details of the procedure, the expected benefits of the procedure and the potential risks.  The patient was brought directly to the operating room and placed supine on the operating room table.  General anesthesia was induced and the patient was  intubated.  She remained stable.  A transesophageal echo probe was placed by the anesthesia team,  which documented a moderate pericardial effusion with some diastolic compression of the right atrium and right ventricle.  The patient's chest and abdomen was then prepped and draped as a sterile field.  A proper timeout was performed.  A small 2 inch  incision was made centered on the xiphoid.  This was carried down to the thoracoabdominal fascia which was opened.  The xiphoid process was removed.  The sternal elevating retractor was placed and the distal sternum was elevated. Underneath the sternal  table, the soft tissue was dissected to expose the pericardium.  An incision was made in the pericardium and clear fluid under pressure immediately drained.  This fluid was sent for cytology as well as cultures.  Next, a pericardial window was created by  excising a 3 x 3 cm circular specimen of the pericardium which was sent for pathology as well as culture.  Next, we drained approximately one-half liter of fluid from the pericardial space.  The pericardium was not thickened or inflamed.  The epicardium  was not inflamed and had no fibrinous exudate.  The fluid was clear.  Next, a dependent soft fluted 28 French drain was placed and the pericardium was brought out through separate incision and secured.  Next, the retractor was removed and the incision was closed in layers using 2-0 Vicryl for the fascia, 3-0 Vicryl for the subcutaneous layer and a 4-0 Vicryl for the skin.  Next, the 28 French soft pleural tube was placed in the right pleural space with the incision made in the anterior axillary line in the fifth interspace.  Clear fluid was drained from this site as well, approximately 400 mL. The tube  was secured to the  skin and both chest tubes connected under water seal Pleur-evac system.  The patient was reversed from anesthesia and then extubated and was stable.  Prior to leaving the room, a chest x-ray  demonstrated both tubes to be in good position and no  pneumothorax, with the preoperative right pleural effusion now resolved.   SHW D: 06/18/2022 11:16:53 am T: 06/18/2022 11:47:00 am  JOB: [id number]/ [id number]

## 2022-06-18 NOTE — Progress Notes (Signed)
Pre Procedure note for inpatients:   Amber Cross has been scheduled for Procedure(s): SUBXYPHOID PERICARDIAL WINDOW (N/A) TRANSESOPHAGEAL ECHOCARDIOGRAM (TEE) (N/A)   and Place Right chest tube today. The various methods of treatment have been discussed with the patient. After consideration of the risks, benefits and treatment options the patient has consented to the planned procedure.   The patient has been seen and labs reviewed. There are no changes in the patient's condition to prevent proceeding with the planned procedure today.  Recent labs:  Lab Results  Component Value Date   WBC 7.0 06/18/2022   HGB 10.0 (L) 06/18/2022   HCT 29.5 (L) 06/18/2022   PLT 266 06/18/2022   GLUCOSE 87 06/18/2022   CHOL 226 (H) 01/28/2022   TRIG 194 (H) 01/28/2022   HDL 44 01/28/2022   LDLCALC 143 (H) 01/28/2022   ALT 14 06/16/2022   AST 19 06/16/2022   NA 140 06/18/2022   K 3.7 06/18/2022   CL 111 06/18/2022   CREATININE 5.15 (H) 06/18/2022   BUN 53 (H) 06/18/2022   CO2 16 (L) 06/18/2022   TSH 1.694 01/29/2022   INR 1.1 06/17/2022   HGBA1C 9.7 (H) 01/28/2022    Dahlia Byes, MD 06/18/2022 8:08 AM

## 2022-06-18 NOTE — Anesthesia Preprocedure Evaluation (Signed)
Anesthesia Evaluation  Patient identified by MRN, date of birth, ID band Patient awake    Reviewed: Allergy & Precautions, H&P , NPO status , Patient's Chart, lab work & pertinent test results  Airway Mallampati: II   Neck ROM: full    Dental   Pulmonary Current Smoker   breath sounds clear to auscultation       Cardiovascular hypertension, +CHF   Rhythm:regular Rate:Normal  Pericardial effusion   Neuro/Psych  Headaches PSYCHIATRIC DISORDERS  Depression    CVA    GI/Hepatic   Endo/Other  diabetes, Type 2    Renal/GU Renal InsufficiencyRenal disease     Musculoskeletal  (+) Arthritis ,    Abdominal   Peds  Hematology  (+) Blood dyscrasia, anemia   Anesthesia Other Findings   Reproductive/Obstetrics                             Anesthesia Physical Anesthesia Plan  ASA: 3  Anesthesia Plan: General   Post-op Pain Management:    Induction: Intravenous  PONV Risk Score and Plan: 2 and Ondansetron, Dexamethasone, Midazolam and Treatment may vary due to age or medical condition  Airway Management Planned: Oral ETT  Additional Equipment: TEE and Arterial line  Intra-op Plan:   Post-operative Plan: Extubation in OR  Informed Consent: I have reviewed the patients History and Physical, chart, labs and discussed the procedure including the risks, benefits and alternatives for the proposed anesthesia with the patient or authorized representative who has indicated his/her understanding and acceptance.     Dental advisory given  Plan Discussed with: CRNA, Anesthesiologist and Surgeon  Anesthesia Plan Comments:        Anesthesia Quick Evaluation

## 2022-06-18 NOTE — Progress Notes (Signed)
HD#2 Subjective:   Summary: This is a 60 year old female with past medical history of heart failure with reduced ejection fraction, hypertension, type 2 diabetes with neuropathy, small cell lung cancer, and gout who presented with concerns of shortness of breath.  Patient found to be in severe symptomatic hypertension, admitted for further work-up.  During workup, patient found to have large pericardial effusion, and found to be in cardiac tamponade, requiring pericardial window.  Overnight Events: No overnight events  Patient is post op, and states that she is having some chest pain.  She denies any headache or lightheadedness.  Patient does state that when she moves her head to the right it hurts her chest.  Patient is very sleepy, and does not answer most of my  Vital signs in last 24 hours: Vitals:   06/18/22 0400 06/18/22 1010 06/18/22 1025 06/18/22 1040  BP: (!) 171/84 123/64 129/67 127/88  Pulse: 70 71 72 71  Resp: 19 16 18 13   Temp: 97.8 F (36.6 C) 97.7 F (36.5 C)  97.7 F (36.5 C)  TempSrc: Oral     SpO2: 97% 94% 98% 94%  Weight: 65.8 kg     Height:       Supplemental O2: Nasal Cannula SpO2: 94 % O2 Flow Rate (L/min): 4 L/min FiO2 (%): 40 %   Physical Exam:  Constitutional: Well-appearing, lying in bed comfortably, and very sleepy HENT: Normocephalic, atraumatic Eyes: conjunctiva non-erythematous Cardiovascular: Tachycardic regular rhythm Pulmonary/Chest: Clear to also patient to left side, right side with some decreased lung sounds noted Abdominal: soft, non-tender, non-distended.  Normoactive bowel sounds MSK: normal bulk and tone Neurological: alert & oriented x 3, 5/5 strength in bilateral upper and lower extremities, normal gait Extremities: No pitting edema observed to bilateral lower extremities  Filed Weights   06/17/22 0007 06/17/22 0500 06/18/22 0400  Weight: 67.1 kg 67.1 kg 65.8 kg     Intake/Output Summary (Last 24 hours) at 06/18/2022  1305 Last data filed at 06/18/2022 1040 Gross per 24 hour  Intake 1384 ml  Output 675 ml  Net 709 ml   Net IO Since Admission: 653.41 mL [06/18/22 1305]  Pertinent Labs:    Latest Ref Rng & Units 06/18/2022   12:06 PM 06/18/2022    6:17 AM 06/17/2022    8:34 AM  CBC  WBC 4.0 - 10.5 K/uL  7.0  6.1   Hemoglobin 12.0 - 15.0 g/dL 10.5  10.0  7.8   Hematocrit 36.0 - 46.0 % 31.0  29.5  24.4   Platelets 150 - 400 K/uL  266  303        Latest Ref Rng & Units 06/18/2022   12:06 PM 06/18/2022    6:17 AM 06/17/2022    8:34 AM  CMP  Glucose 70 - 99 mg/dL  87  105   BUN 6 - 20 mg/dL  53  56   Creatinine 0.44 - 1.00 mg/dL  5.15  5.53   Sodium 135 - 145 mmol/L 141  140  141   Potassium 3.5 - 5.1 mmol/L 4.0  3.7  3.7   Chloride 98 - 111 mmol/L  111  116   CO2 22 - 32 mmol/L  16  17   Calcium 8.9 - 10.3 mg/dL  7.9  7.6     Imaging: DG Chest Port 1 View  Result Date: 06/18/2022 CLINICAL DATA:  Status post CABG EXAM: PORTABLE CHEST 1 VIEW COMPARISON:  CXR 06/16/22, CT chest 06/17/22 FINDINGS: Status post  drainage of a large pericardial effusion. Endotracheal terminates 5 cm above the carina. Right-sided thoracostomy tube and likely a pericardial drain in place. Compared to prior exam cardiac contours slightly decreased in size. Linear opacity in the right lung apex favored to represent scar. No pneumothorax. No focal airspace opacity. Displaced rib fractures. Visualized upper abdomen is unremarkable. IMPRESSION: 1. Endotracheal tube terminates 5 cm above the carina. 2. Right sided thoracostomy tube in place.  no pneumothorax. Electronically Signed   By: Marin Roberts M.D.   On: 06/18/2022 10:05   CT CHEST WO CONTRAST  Result Date: 06/17/2022 CLINICAL DATA:  Abnormal xray - lung nodule, >= 1 cm EXAM: CT CHEST WITHOUT CONTRAST TECHNIQUE: Multidetector CT imaging of the chest was performed following the standard protocol without IV contrast. RADIATION DOSE REDUCTION: This exam was performed  according to the departmental dose-optimization program which includes automated exposure control, adjustment of the mA and/or kV according to patient size and/or use of iterative reconstruction technique. COMPARISON:  CT chest 03/26/2022 clear in a CT chest 01/03/2022 FINDINGS: Cardiovascular: Similar-appearing enlarged cardiac silhouette. Similar-appearing at least moderate volume pericardial effusion. The thoracic aorta is normal in caliber. Mild atherosclerotic plaque of the thoracic aorta. At least 2 vessel coronary artery calcifications. Mediastinum/Nodes: No gross hilar adenopathy, noting limited sensitivity for the detection of hilar adenopathy on this noncontrast study. No enlarged mediastinal or axillary lymph nodes. Thyroid gland, trachea, and esophagus demonstrate no significant findings. Lungs/Pleura: At least mild centrilobular emphysematous changes. Disuse bronchial wall thickening. Partial collapse of the bilateral lower lobes. Interval development of right apical peribronchovascular ground-glass and consolidative airspace opacities. Redemonstration of metallic density within the right apex. Previously visualized pulmonary nodule not identified due to overlying airspace opacities. Stable left upper lobe ground-glass scattered airspace opacities (4:40, 67, 80)). No new pulmonary nodule. No pulmonary mass. Slightly increased in size small volume right and trace volume left pleural effusions. No pneumothorax. Upper Abdomen: No acute abnormality. Musculoskeletal: No chest wall abnormality. No suspicious lytic or blastic osseous lesions. No acute displaced fracture. Interval development of a subacute mid sternal body fracture. IMPRESSION: 1. Interval development of right apical peribronchovascular ground-glass and consolidative airspace opacities. Previously visualized pulmonary nodule not identified due to overlying airspace opacities. Finding may represent infection/inflammation. Underlying malignancy  not excluded. 2. Stable left upper lobe ground-glass scattered airspace opacities. 3. Slightly increased in size small volume right and trace volume left pleural effusions. Associated partial collapse of the lower lobes. 4. Markedly limited evaluation on this noncontrast study. 5. Stable cardiomegaly with similar appearing at least moderate volume pericardial effusion. 6. Interval development of a subacute mid sternal body fracture. 7. Aortic Atherosclerosis (ICD10-I70.0) and Emphysema (ICD10-J43.9). Electronically Signed   By: Iven Finn M.D.   On: 06/17/2022 21:15   ECHOCARDIOGRAM COMPLETE  Result Date: 06/17/2022    ECHOCARDIOGRAM REPORT   Patient Name:   [facility] Date of Exam: 06/17/2022 Medical Rec #:  [id number]       Height:       67.0 in Accession #:    [id number]      Weight:       147.9 lb Date of Birth:  24-Oct-1960       BSA:          1.779 m Patient Age:    61 years        BP:           185/88 mmHg Patient Gender: F  HR:           83 bpm. Exam Location:  Inpatient Procedure: 2D Echo, Cardiac Doppler and Color Doppler Indications:    Congestive Heart Failure I50.9  History:        Patient has prior history of Echocardiogram examinations, most                 recent 03/14/2022. CHF, Stroke; Risk Factors:Hypertension,                 Diabetes and Dyslipidemia. Small cell lung cancer, right upper                 lobe ([place]).  Sonographer:    Alvino Chapel RCS Referring Phys: [place]  1. Compared with the echo 02/2022, pericardial effusion is larger.  2. Left ventricular ejection fraction, by estimation, is 35 to 40%. The left ventricle has mild to moderately decreased function. The left ventricle demonstrates regional wall motion abnormalities (see scoring diagram/findings for description). There is  moderate concentric left ventricular hypertrophy. Left ventricular diastolic parameters are consistent with Grade I diastolic dysfunction (impaired  relaxation).  3. Right ventricular systolic function is normal. The right ventricular size is normal. There is mildly elevated pulmonary artery systolic pressure.  4. Left atrial size was severely dilated.  5. Large pericardial effusion measuring up to 1.8 cm. Large pericardial effusion. The pericardial effusion is circumferential. Findings are consistent with cardiac tamponade. Large pleural effusion in the left lateral region.  6. The mitral valve is normal in structure. Mild mitral valve regurgitation. No evidence of mitral stenosis.  7. The aortic valve is tricuspid. There is mild calcification of the aortic valve. There is mild thickening of the aortic valve. Aortic valve regurgitation is not visualized. No aortic stenosis is present.  8. The inferior vena cava is dilated in size with <50% respiratory variability, suggesting right atrial pressure of 15 mmHg. FINDINGS  Left Ventricle: Left ventricular ejection fraction, by estimation, is 35 to 40%. The left ventricle has mild to moderately decreased function. The left ventricle demonstrates regional wall motion abnormalities. The left ventricular internal cavity size was normal in size. There is moderate concentric left ventricular hypertrophy. Left ventricular diastolic parameters are consistent with Grade I diastolic dysfunction (impaired relaxation).  LV Wall Scoring: The entire anterior wall, entire lateral wall, inferior septum, and entire inferior wall are hypokinetic. The anterior septum and apex are normal. Right Ventricle: The right ventricular size is normal. No increase in right ventricular wall thickness. Right ventricular systolic function is normal. There is mildly elevated pulmonary artery systolic pressure. The tricuspid regurgitant velocity is 2.52  m/s, and with an assumed right atrial pressure of 15 mmHg, the estimated right ventricular systolic pressure is 53.0 mmHg. Left Atrium: Left atrial size was severely dilated. Right Atrium: Right  atrial size was normal in size. Pericardium: Large pericardial effusion measuring up to 1.8 cm. A large pericardial effusion is present. The pericardial effusion is circumferential. There is diastolic collapse of the right ventricular free wall, diastolic collapse of the right atrial wall and excessive respiratory variation in the tricuspid valve spectral Doppler velocities. There is evidence of cardiac tamponade. Mitral Valve: The mitral valve is normal in structure. There is mild thickening of the anterior mitral valve leaflet(s). Mild mitral valve regurgitation. No evidence of mitral valve stenosis. Tricuspid Valve: The tricuspid valve is normal in structure. Tricuspid valve regurgitation is mild . No evidence of tricuspid stenosis. Aortic Valve: The aortic  valve is tricuspid. There is mild calcification of the aortic valve. There is mild thickening of the aortic valve. Aortic valve regurgitation is not visualized. No aortic stenosis is present. Pulmonic Valve: The pulmonic valve was normal in structure. Pulmonic valve regurgitation is mild. No evidence of pulmonic stenosis. Aorta: The aortic root is normal in size and structure. Venous: The inferior vena cava is dilated in size with less than 50% respiratory variability, suggesting right atrial pressure of 15 mmHg. IAS/Shunts: No atrial level shunt detected by color flow Doppler. Additional Comments: Findings are consistent with tamponade. There is a large pleural effusion in the left lateral region.  LEFT VENTRICLE PLAX 2D LVIDd:         4.80 cm      Diastology LVIDs:         4.10 cm      LV e' medial:    4.43 cm/s LV PW:         1.70 cm      LV E/e' medial:  25.5 LV IVS:        1.50 cm      LV e' lateral:   3.90 cm/s LVOT diam:     1.90 cm      LV E/e' lateral: 29.0 LV SV:         61 LV SV Index:   34 LVOT Area:     2.84 cm  LV Volumes (MOD) LV vol d, MOD A2C: 155.0 ml LV vol d, MOD A4C: 148.0 ml LV vol s, MOD A2C: 72.2 ml LV vol s, MOD A4C: 83.8 ml LV SV MOD  A2C:     82.8 ml LV SV MOD A4C:     148.0 ml LV SV MOD BP:      72.7 ml RIGHT VENTRICLE RV S prime:     8.78 cm/s TAPSE (M-mode): 2.2 cm LEFT ATRIUM             Index        RIGHT ATRIUM           Index LA diam:        4.00 cm 2.25 cm/m   RA Area:     16.10 cm LA Vol (A2C):   96.3 ml 54.13 ml/m  RA Volume:   42.30 ml  23.78 ml/m LA Vol (A4C):   77.6 ml 43.62 ml/m LA Biplane Vol: 87.8 ml 49.35 ml/m  AORTIC VALVE LVOT Vmax:   101.00 cm/s LVOT Vmean:  63.800 cm/s LVOT VTI:    0.214 m  AORTA Ao Root diam: 3.50 cm MITRAL VALVE                  TRICUSPID VALVE MV Area (PHT): 4.06 cm       TR Peak grad:   25.4 mmHg MV Decel Time: 187 msec       TR Vmax:        252.00 cm/s MR Peak grad:    132.2 mmHg MR Mean grad:    82.0 mmHg    SHUNTS MR Vmax:         575.00 cm/s  Systemic VTI:  0.21 m MR Vmean:        415.0 cm/s   Systemic Diam: 1.90 cm MR PISA:         3.08 cm MR PISA Eff ROA: 13 mm MR PISA Radius:  0.70 cm MV E velocity: 113.00 cm/s MV A velocity: 148.00 cm/s MV E/A ratio:  0.76 Skeet Latch MD  Electronically signed by Skeet Latch MD Signature Date/Time: 06/17/2022/2:28:32 PM    Final    US RENAL  Result Date: 06/17/2022 CLINICAL DATA:  Acute renal insufficiency. EXAM: RENAL / URINARY TRACT ULTRASOUND COMPLETE COMPARISON:  March 14, 2022. FINDINGS: Right Kidney: Renal measurements: 12.2 x 7.0 x 6.4 cm = volume: 240 mL. Increased echogenicity of renal parenchyma is noted. 1.3 cm simple cyst is seen in upper pole. No mass or hydronephrosis visualized. Left Kidney: Renal measurements: 12.4 x 6.6 x 6.3 cm = volume: 269 mL. Increased echogenicity of renal parenchyma is noted. No mass or hydronephrosis visualized. Bladder: Appears normal for degree of bladder distention. Other: None. IMPRESSION: Increased echogenicity of renal parenchyma is noted bilaterally consistent with medical renal disease. No hydronephrosis or renal obstruction is noted. Electronically Signed   By: Marijo Conception M.D.   On:  06/17/2022 13:57    Assessment/Plan:   Principal Problem:   Hypertensive emergency Active Problems:   Type 2 diabetes mellitus with diabetic polyneuropathy (HCC)   Acute on chronic systolic congestive heart failure (HCC)   AKI (acute kidney injury) ([place])   Tamponade   Patient Summary: Amber Cross is a 61 y.o.  female with past medical history of heart failure with reduced ejection fraction, hypertension, type 2 diabetes with neuropathy, small cell lung cancer, and gout who presented with concerns of shortness of breath.  Patient found to be in severe symptomatic hypertension, admitted for further work-up. During workup, patient found to have large pericardial effusion, and found to be in cardiac tamponade, requiring pericardial window.  #Cardiac tamponade, resolving #Pericardial effusion #Acute heart failure exacerbation, resolving #Acute on chronic hypoxemic respiratory failure, resolving Patient had echocardiogram on 06/17/2022, which showed concerning pericardial effusion.  Cardiology was consulted, who states that the patient may be in cardiac tamponade.  This effusion, required pericardial window to be made by CT surgery today.  Patient is reporting some pain today in her chest postop.  For postop observation, patient has been admitted to ICU.  Patient still requiring oxygen, with most recent oxygen requirement at 2 L on nasal cannula.  After pericardial window was made, patient had pericardial fluid and tissue sent for pathology, cytology, and cultures.  Will continue to follow results -Continue to monitor patient in ICU -Continue to monitor patient's blood pressure -Appreciate CT surgery, and cardiology recommendations -Wean oxygen as needed to keep oxygen saturation greater than 90% on room air -Holding diuresis per cardiology recommendations -Continue carvedilol 25 mg twice daily, Imdur 30 mg daily, amlodipine 10 mg daily, and increase hydralazine 50 mg to 75 mg 3 times a  day -Follow cytology -Strict I's and O's -Daily weights  # Hypertension Patient's current hypertension regimen includes Imdur 30 mg daily, carvedilol 25 mg twice daily, hydralazine 50 mg 3 times a day, and amlodipine 10 mg daily.  Patient's blood pressure currently waxing between 150s and 180s on this regimen.  Patient denies any headaches or shortness of breath.  Patient denies any vision changes.  Patient's blood pressure is elevated even before surgery.  On exam, postop patient's blood pressure elevated into the 180s.  Plan will be to increase hydralazine, given increasing carvedilol could cause decrease in heart rate. -Continue Imdur 30 mg daily, carvedilol 25 mg twice daily, amlodipine 10 mg daily -Increase hydralazine to 75 mg 3 times daily -Continue to monitor blood pressure  #Acute on chronic kidney disease stage IV likely prerenal Patient's creatinine this morning decreasing from 5.5 to 5.15.  This acute kidney  injury likely in the setting of shock kidney and with resolution of blood pressure, kidney function should start restoring.  Nephrology was consulted, who states they will see the patient outpatient.  Will continue to hold diuresis, and continue to monitor BMP -Monitor BMP -Monitor urine output  #Type 2 diabetes with neuropathy Patient has history of type 2 diabetes mellitus with neuropathy.  Patient is currently on sliding scale insulin.  Blood sugars ranging between 81-176.  This is at goal -Continue sliding scale insulin  #Hyperlipidemia Patient has a past medical history of hyperlipidemia.  Patient is continued on Crestor 40 mg daily -Continue Crestor 40 mg daily  #Small cell lung cancer Patient has past medical history of small cell lung cancer diagnosed on 11/2021.  Patient was due to have surveillance imaging with CT chest and MRI of brain at the end of this month, will obtain itching here. Plan: -CT chest unable to visualize pulmonary nodule given groundglass  airspace opacities. -MRI brain pending  #Gout No concern for acute flare at this time.  Continue monitoring  Diet: Heart Healthy, carb modified, 1500 mL fluid restriction IVF: None,None VTE: Heparin Code: Full   Dispo: Anticipated discharge to Home in 2 days pending clinical improvement.   [place] Resident PGY-1 [phone] Please contact the on call pager after 5 pm and on weekends at [phone].

## 2022-06-18 NOTE — Brief Op Note (Signed)
06/18/2022  10:00 AM  PATIENT:  Amber Cross  61 y.o. female  PRE-OPERATIVE DIAGNOSIS:  PERICARDIAL EFFUSION  POST-OPERATIVE DIAGNOSIS:  PERICARDIAL EFFUSION  PROCEDURE:   SUBXYPHOID PERICARDIAL WINDOW  TRANSESOPHAGEAL ECHOCARDIOGRAM (TEE)  CHEST TUBE INSERTION (Right)  SURGEON:  Surgeon(s) and Role: Dahlia Byes, MD - Primary  PHYSICIAN ASSISTANT: Wynelle Beckmann PA-C  ASSISTANTS: none   ANESTHESIA:   general  EBL:  25 mL   BLOOD ADMINISTERED:none  DRAINS:  Mediastinal and right pleural chest tube    LOCAL MEDICATIONS USED:  NONE  SPECIMEN:  Source of Specimen:  Pericardial tissue, pericardial fluid, right pleural fluid  DISPOSITION OF SPECIMEN:   Pathology, cytology and cultures  COUNTS CORRECT:  YES  DICTATION: .Dragon Dictation  PLAN OF CARE: Admit to inpatient   PATIENT DISPOSITION:  PACU - hemodynamically stable.   Delay start of Pharmacological VTE agent (>24hrs) due to surgical blood loss or risk of bleeding: no

## 2022-06-18 NOTE — Transfer of Care (Signed)
Immediate Anesthesia Transfer of Care Note  Patient: Amber Cross  Procedure(s) Performed: SUBXYPHOID PERICARDIAL WINDOW TRANSESOPHAGEAL ECHOCARDIOGRAM (TEE) CHEST TUBE INSERTION (Right: Chest)  Patient Location: PACU  Anesthesia Type:General  Level of Consciousness: awake, alert , and oriented  Airway & Oxygen Therapy: Patient Spontanous Breathing and Patient connected to face mask oxygen  Post-op Assessment: Report given to RN, Post -op Vital signs reviewed and stable, and Patient moving all extremities X 4  Post vital signs: Reviewed and stable  Last Vitals:  Vitals Value Taken Time  BP 123/64 06/18/22 1010  Temp    Pulse 68 06/18/22 1013  Resp 21 06/18/22 1013  SpO2 94 % 06/18/22 1013  Vitals shown include unvalidated device data.  Last Pain:  Vitals:   06/18/22 0400  TempSrc: Oral  PainSc:          Complications: No notable events documented.

## 2022-06-19 ENCOUNTER — Encounter (HOSPITAL_COMMUNITY): Payer: Self-pay | Admitting: Cardiothoracic Surgery

## 2022-06-19 ENCOUNTER — Inpatient Hospital Stay (HOSPITAL_COMMUNITY): Payer: Commercial Managed Care - HMO

## 2022-06-19 DIAGNOSIS — I161 Hypertensive emergency: Secondary | ICD-10-CM | POA: Diagnosis not present

## 2022-06-19 LAB — BASIC METABOLIC PANEL
Anion gap: 9 (ref 5–15)
BUN: 55 mg/dL — ABNORMAL HIGH (ref 6–20)
CO2: 19 mmol/L — ABNORMAL LOW (ref 22–32)
Calcium: 7.2 mg/dL — ABNORMAL LOW (ref 8.9–10.3)
Chloride: 110 mmol/L (ref 98–111)
Creatinine, Ser: 5.3 mg/dL — ABNORMAL HIGH (ref 0.44–1.00)
GFR, Estimated: 9 mL/min — ABNORMAL LOW (ref >60)
Glucose, Bld: 112 mg/dL — ABNORMAL HIGH (ref 70–99)
Potassium: 3.8 mmol/L (ref 3.5–5.1)
Sodium: 138 mmol/L (ref 135–145)

## 2022-06-19 LAB — CBC
HCT: 29.1 % — ABNORMAL LOW (ref 36.0–46.0)
Hemoglobin: 9.1 g/dL — ABNORMAL LOW (ref 12.0–15.0)
MCH: 27.9 pg (ref 26.0–34.0)
MCHC: 31.3 g/dL (ref 30.0–36.0)
MCV: 89.3 fL (ref 80.0–100.0)
Platelets: 270 10*3/uL (ref 150–400)
RBC: 3.26 MIL/uL — ABNORMAL LOW (ref 3.87–5.11)
RDW: 16.6 % — ABNORMAL HIGH (ref 11.5–15.5)
WBC: 14.3 10*3/uL — ABNORMAL HIGH (ref 4.0–10.5)
nRBC: 0 % (ref 0.0–0.2)

## 2022-06-19 LAB — RENAL FUNCTION PANEL
Albumin: 2.1 g/dL — ABNORMAL LOW (ref 3.5–5.0)
Anion gap: 14 (ref 5–15)
BUN: 58 mg/dL — ABNORMAL HIGH (ref 6–20)
CO2: 18 mmol/L — ABNORMAL LOW (ref 22–32)
Calcium: 7.5 mg/dL — ABNORMAL LOW (ref 8.9–10.3)
Chloride: 108 mmol/L (ref 98–111)
Creatinine, Ser: 5.57 mg/dL — ABNORMAL HIGH (ref 0.44–1.00)
GFR, Estimated: 8 mL/min — ABNORMAL LOW (ref >60)
Glucose, Bld: 149 mg/dL — ABNORMAL HIGH (ref 70–99)
Phosphorus: 7.8 mg/dL — ABNORMAL HIGH (ref 2.5–4.6)
Potassium: 4.2 mmol/L (ref 3.5–5.1)
Sodium: 140 mmol/L (ref 135–145)

## 2022-06-19 LAB — GLUCOSE, CAPILLARY
Glucose-Capillary: 112 mg/dL — ABNORMAL HIGH (ref 70–99)
Glucose-Capillary: 115 mg/dL — ABNORMAL HIGH (ref 70–99)
Glucose-Capillary: 119 mg/dL — ABNORMAL HIGH (ref 70–99)
Glucose-Capillary: 126 mg/dL — ABNORMAL HIGH (ref 70–99)
Glucose-Capillary: 143 mg/dL — ABNORMAL HIGH (ref 70–99)
Glucose-Capillary: 167 mg/dL — ABNORMAL HIGH (ref 70–99)
Glucose-Capillary: 96 mg/dL (ref 70–99)

## 2022-06-19 LAB — POCT I-STAT 7, (LYTES, BLD GAS, ICA,H+H)
Acid-base deficit: 8 mmol/L — ABNORMAL HIGH (ref 0.0–2.0)
Bicarbonate: 19.2 mmol/L — ABNORMAL LOW (ref 20.0–28.0)
Calcium, Ion: 1.13 mmol/L — ABNORMAL LOW (ref 1.15–1.40)
HCT: 27 % — ABNORMAL LOW (ref 36.0–46.0)
Hemoglobin: 9.2 g/dL — ABNORMAL LOW (ref 12.0–15.0)
O2 Saturation: 91 %
Patient temperature: 97.6
Potassium: 3.9 mmol/L (ref 3.5–5.1)
Sodium: 139 mmol/L (ref 135–145)
TCO2: 21 mmol/L — ABNORMAL LOW (ref 22–32)
pCO2 arterial: 44.6 mmHg (ref 32–48)
pH, Arterial: 7.24 — ABNORMAL LOW (ref 7.35–7.45)
pO2, Arterial: 70 mmHg — ABNORMAL LOW (ref 83–108)

## 2022-06-19 LAB — IRON AND TIBC
Iron: 67 ug/dL (ref 28–170)
Saturation Ratios: 29 % (ref 10.4–31.8)
TIBC: 228 ug/dL — ABNORMAL LOW (ref 250–450)
UIBC: 161 ug/dL

## 2022-06-19 LAB — CYTOLOGY - NON PAP

## 2022-06-19 LAB — SURGICAL PATHOLOGY

## 2022-06-19 LAB — FERRITIN: Ferritin: 121 ng/mL (ref 11–307)

## 2022-06-19 MED ORDER — HYDRALAZINE HCL 25 MG PO TABS
25.0000 mg | ORAL_TABLET | Freq: Two times a day (BID) | ORAL | Status: DC
  Administered 2022-06-20 – 2022-06-24 (×8): 25 mg via ORAL
  Filled 2022-06-19 (×8): qty 1

## 2022-06-19 MED ORDER — ALBUMIN HUMAN 5 % IV SOLN
12.5000 g | Freq: Once | INTRAVENOUS | Status: AC
  Administered 2022-06-19: 12.5 g via INTRAVENOUS
  Filled 2022-06-19: qty 250

## 2022-06-19 MED ORDER — INSULIN ASPART 100 UNIT/ML IJ SOLN
0.0000 [IU] | Freq: Three times a day (TID) | INTRAMUSCULAR | Status: DC
  Administered 2022-06-20: 1 [IU] via SUBCUTANEOUS
  Administered 2022-06-20: 3 [IU] via SUBCUTANEOUS
  Administered 2022-06-21 (×2): 1 [IU] via SUBCUTANEOUS
  Administered 2022-06-21 – 2022-06-22 (×2): 2 [IU] via SUBCUTANEOUS
  Administered 2022-06-22 – 2022-06-25 (×10): 1 [IU] via SUBCUTANEOUS
  Administered 2022-06-25 – 2022-06-26 (×2): 2 [IU] via SUBCUTANEOUS
  Administered 2022-06-26: 1 [IU] via SUBCUTANEOUS
  Administered 2022-06-26: 3 [IU] via SUBCUTANEOUS
  Administered 2022-06-27 (×2): 2 [IU] via SUBCUTANEOUS
  Administered 2022-06-28 (×3): 1 [IU] via SUBCUTANEOUS
  Administered 2022-06-30 (×3): 2 [IU] via SUBCUTANEOUS
  Administered 2022-07-01: 1 [IU] via SUBCUTANEOUS
  Administered 2022-07-01: 2 [IU] via SUBCUTANEOUS
  Administered 2022-07-01 – 2022-07-02 (×2): 1 [IU] via SUBCUTANEOUS

## 2022-06-19 MED ORDER — ISOSORBIDE MONONITRATE ER 30 MG PO TB24
15.0000 mg | ORAL_TABLET | Freq: Every day | ORAL | Status: DC
  Administered 2022-06-20 – 2022-06-24 (×5): 15 mg via ORAL
  Filled 2022-06-19 (×5): qty 1

## 2022-06-19 MED ORDER — HEPARIN SODIUM (PORCINE) 5000 UNIT/ML IJ SOLN
5000.0000 [IU] | Freq: Three times a day (TID) | INTRAMUSCULAR | Status: DC
  Administered 2022-06-20 – 2022-06-24 (×13): 5000 [IU] via SUBCUTANEOUS
  Filled 2022-06-19 (×13): qty 1

## 2022-06-19 MED ORDER — HYDRALAZINE HCL 50 MG PO TABS: 75.0000 mg | ORAL_TABLET | Freq: Two times a day (BID) | ORAL | Status: DC

## 2022-06-19 MED ORDER — SODIUM CHLORIDE 0.9 % IV SOLN
6.2500 mg | Freq: Four times a day (QID) | INTRAVENOUS | Status: DC | PRN
  Administered 2022-06-29: 6.25 mg via INTRAVENOUS
  Filled 2022-06-19 (×2): qty 0.25

## 2022-06-19 MED ORDER — SODIUM CHLORIDE 0.9 % IV SOLN
12.5000 mg | Freq: Four times a day (QID) | INTRAVENOUS | Status: DC | PRN
  Administered 2022-06-19: 12.5 mg via INTRAVENOUS
  Filled 2022-06-19: qty 0.5

## 2022-06-19 MED ORDER — ALBUMIN HUMAN 25 % IV SOLN
12.5000 g | Freq: Four times a day (QID) | INTRAVENOUS | Status: AC
  Administered 2022-06-20 (×3): 12.5 g via INTRAVENOUS
  Filled 2022-06-19 (×4): qty 50

## 2022-06-19 MED ORDER — CARVEDILOL 12.5 MG PO TABS
12.5000 mg | ORAL_TABLET | Freq: Two times a day (BID) | ORAL | Status: DC
  Administered 2022-06-21 – 2022-07-02 (×24): 12.5 mg via ORAL
  Filled 2022-06-19 (×23): qty 1

## 2022-06-19 MED ORDER — METOCLOPRAMIDE HCL 5 MG/ML IJ SOLN
10.0000 mg | Freq: Four times a day (QID) | INTRAMUSCULAR | Status: DC
  Administered 2022-06-19 – 2022-06-21 (×8): 10 mg via INTRAVENOUS
  Filled 2022-06-19 (×8): qty 2

## 2022-06-19 NOTE — Progress Notes (Signed)
Rounding Note    Patient Name: Amber Cross Date of Encounter: 06/19/2022  [facility] Cardiologist: Berniece Salines, DO   Subjective   Pain at chest tube sites, but overall feeling better. Total of 1750 mL drainage from chest tubes.  Inpatient Medications    Scheduled Meds:  acetaminophen  1,000 mg Oral Q6H   Or   acetaminophen (TYLENOL) oral liquid 160 mg/5 mL  1,000 mg Oral Q6H   amLODipine  10 mg Oral Daily   atorvastatin  80 mg Oral Daily   bisacodyl  10 mg Oral Daily   carvedilol  25 mg Oral BID WC   Chlorhexidine Gluconate Cloth  6 each Topical Daily   enoxaparin (LOVENOX) injection  30 mg Subcutaneous Daily   gabapentin  100 mg Oral QHS   hydrALAZINE  75 mg Oral Q12H   insulin aspart  0-9 Units Subcutaneous Q4H   isosorbide mononitrate  30 mg Oral Daily   mupirocin ointment  1 Application Nasal BID   pantoprazole  40 mg Oral Daily   senna-docusate  1 tablet Oral QHS   Continuous Infusions:  sodium chloride 75 mL/hr at 06/19/22 0901   albumin human Stopped (06/19/22 0456)   PRN Meds: HYDROmorphone (DILAUDID) injection, labetalol, nitroGLYCERIN, ondansetron (ZOFRAN) IV, oxyCODONE, traZODone   Vital Signs    Vitals:   06/19/22 0500 06/19/22 0600 06/19/22 0649 06/19/22 0700  BP: (!) 108/58 (!) 116/56  128/63  Pulse: 65  65 62  Resp: 10 11 12 20   Temp:    (!) 97 F (36.1 C)  TempSrc:      SpO2: 96%  96% 95%  Weight:      Height:        Intake/Output Summary (Last 24 hours) at 06/19/2022 1018 Last data filed at 06/19/2022 0700 Gross per 24 hour  Intake 1954.25 ml  Output 2140 ml  Net -185.75 ml      06/19/2022    4:10 AM 06/18/2022    4:00 AM 06/17/2022    5:00 AM  Last 3 Weights  Weight (lbs) 145 lb 11.6 oz 145 lb 1 oz 147 lb 14.9 oz  Weight (kg) 66.1 kg 65.8 kg 67.1 kg      Telemetry    Normal sinus rhythm, 1 brief 8 beat run of nonsustained VT yesterday evening - Personally Reviewed  ECG    No new tracing- Personally  Reviewed  Physical Exam  Appears well.  Pale. GEN: No acute distress.   Neck: No JVD Cardiac: RRR, no murmurs, rubs, or gallops.  Respiratory: Clear to auscultation bilaterally. GI: Soft, nontender, non-distended  MS: No edema; No deformity. Neuro:  Nonfocal  Psych: Normal affect   Labs    High Sensitivity Troponin:   Recent Labs  Lab 06/16/22 0955 06/16/22 1240  TROPONINIHS 44* 51*     Chemistry Recent Labs  Lab 06/16/22 0955 06/16/22 1012 06/18/22 0617 06/18/22 0855 06/18/22 1648 06/19/22 0355 06/19/22 0408  NA 142   < > 140   < > 141 138 139  K 3.8   < > 3.7   < > 3.8 3.8 3.9  CL 116*   < > 111  --  110 110  --   CO2 18*   < > 16*  --  18* 19*  --   GLUCOSE 211*   < > 87  --  134* 112*  --   BUN 55*   < > 53*  --  54* 55*  --  CREATININE 5.46*   < > 5.15*  --  5.22* 5.30*  --   CALCIUM 8.0*   < > 7.9*  --  7.5* 7.2*  --   PROT 6.6  --   --   --   --   --   --   ALBUMIN 2.4*  --   --   --  1.7*  --   --   AST 19  --   --   --   --   --   --   ALT 14  --   --   --   --   --   --   ALKPHOS 194*  --   --   --   --   --   --   BILITOT 0.4  --   --   --   --   --   --   GFRNONAA 8*   < > 9*  --  9* 9*  --   ANIONGAP 8   < > 13  --  13 9  --    < > = values in this interval not displayed.    Lipids No results for input(s): "CHOL", "TRIG", "HDL", "LABVLDL", "LDLCALC", "CHOLHDL" in the last 168 hours.  Hematology Recent Labs  Lab 06/17/22 0834 06/18/22 0617 06/18/22 0855 06/18/22 1645 06/19/22 0355 06/19/22 0408  WBC 6.1 7.0  --   --  14.3*  --   RBC 2.76* 3.47*  --   --  3.26*  --   HGB 7.8* 10.0*   < > 10.9* 9.1* 9.2*  HCT 24.4* 29.5*   < > 32.0* 29.1* 27.0*  MCV 88.4 85.0  --   --  89.3  --   MCH 28.3 28.8  --   --  27.9  --   MCHC 32.0 33.9  --   --  31.3  --   RDW 16.7* 16.3*  --   --  16.6*  --   PLT 303 266  --   --  270  --    < > = values in this interval not displayed.   Thyroid No results for input(s): "TSH", "FREET4" in the last 168 hours.   BNP Recent Labs  Lab 06/16/22 0955  BNP 3,614.4*    DDimer No results for input(s): "DDIMER" in the last 168 hours.   Radiology    DG Chest Port 1 View  Result Date: 06/19/2022 CLINICAL DATA:  Status post pericardial window creation. Chest tube present. EXAM: PORTABLE CHEST 1 VIEW COMPARISON:  AP chest 06/18/2022, CT chest 06/17/2022 and CT chest 01/03/2022 FINDINGS: Interval extubation. Cardiac silhouette is again moderately enlarged. Note is made of a moderate-to-large pericardial effusion on 06/17/2022 CT. There are again sided chest tubes overlying the lateral aspect of the mid height of the right hemithorax and the right infrahilar region. The more lateral chest tube tip projects slightly more superiorly compared to 06/18/2022, now at the mid height of the right hemithorax instead of the more inferior aspect of the right hemithorax. Moderate left pleural effusion with left basilar homogeneous airspace opacification, unchanged to mildly worsened from prior. Mild bilateral interstitial thickening is slightly improved from prior. No pneumothorax.  No acute skeletal abnormality. IMPRESSION: Compared to 06/18/2022: 1. Interval extubation. 2. A right chest tube tip projects over the lateral right hemithorax, slightly more superior compared to 06/18/2022, now at the mid height of the right hemithorax instead of the more inferior aspect of the right hemithorax.  A right infrahilar possible pericardial drain appears unchanged in position. 3. Moderate left pleural effusion with left basilar airspace opacification, unchanged to mildly worsened from prior. 4. Mild bilateral interstitial thickening is slightly improved from prior. Electronically Signed   By: Yvonne Kendall M.D.   On: 06/19/2022 08:31   DG Chest Port 1 View  Result Date: 06/18/2022 CLINICAL DATA:  Status post CABG EXAM: PORTABLE CHEST 1 VIEW COMPARISON:  CXR 06/16/22, CT chest 06/17/22 FINDINGS: Status post drainage of a large pericardial  effusion. Endotracheal terminates 5 cm above the carina. Right-sided thoracostomy tube and likely a pericardial drain in place. Compared to prior exam cardiac contours slightly decreased in size. Linear opacity in the right lung apex favored to represent scar. No pneumothorax. No focal airspace opacity. Displaced rib fractures. Visualized upper abdomen is unremarkable. IMPRESSION: 1. Endotracheal tube terminates 5 cm above the carina. 2. Right sided thoracostomy tube in place.  no pneumothorax. Electronically Signed   By: Marin Roberts M.D.   On: 06/18/2022 10:05   CT CHEST WO CONTRAST  Result Date: 06/17/2022 CLINICAL DATA:  Abnormal xray - lung nodule, >= 1 cm EXAM: CT CHEST WITHOUT CONTRAST TECHNIQUE: Multidetector CT imaging of the chest was performed following the standard protocol without IV contrast. RADIATION DOSE REDUCTION: This exam was performed according to the departmental dose-optimization program which includes automated exposure control, adjustment of the mA and/or kV according to patient size and/or use of iterative reconstruction technique. COMPARISON:  CT chest 03/26/2022 clear in a CT chest 01/03/2022 FINDINGS: Cardiovascular: Similar-appearing enlarged cardiac silhouette. Similar-appearing at least moderate volume pericardial effusion. The thoracic aorta is normal in caliber. Mild atherosclerotic plaque of the thoracic aorta. At least 2 vessel coronary artery calcifications. Mediastinum/Nodes: No gross hilar adenopathy, noting limited sensitivity for the detection of hilar adenopathy on this noncontrast study. No enlarged mediastinal or axillary lymph nodes. Thyroid gland, trachea, and esophagus demonstrate no significant findings. Lungs/Pleura: At least mild centrilobular emphysematous changes. Disuse bronchial wall thickening. Partial collapse of the bilateral lower lobes. Interval development of right apical peribronchovascular ground-glass and consolidative airspace opacities.  Redemonstration of metallic density within the right apex. Previously visualized pulmonary nodule not identified due to overlying airspace opacities. Stable left upper lobe ground-glass scattered airspace opacities (4:40, 67, 80)). No new pulmonary nodule. No pulmonary mass. Slightly increased in size small volume right and trace volume left pleural effusions. No pneumothorax. Upper Abdomen: No acute abnormality. Musculoskeletal: No chest wall abnormality. No suspicious lytic or blastic osseous lesions. No acute displaced fracture. Interval development of a subacute mid sternal body fracture. IMPRESSION: 1. Interval development of right apical peribronchovascular ground-glass and consolidative airspace opacities. Previously visualized pulmonary nodule not identified due to overlying airspace opacities. Finding may represent infection/inflammation. Underlying malignancy not excluded. 2. Stable left upper lobe ground-glass scattered airspace opacities. 3. Slightly increased in size small volume right and trace volume left pleural effusions. Associated partial collapse of the lower lobes. 4. Markedly limited evaluation on this noncontrast study. 5. Stable cardiomegaly with similar appearing at least moderate volume pericardial effusion. 6. Interval development of a subacute mid sternal body fracture. 7. Aortic Atherosclerosis (ICD10-I70.0) and Emphysema (ICD10-J43.9). Electronically Signed   By: Iven Finn M.D.   On: 06/17/2022 21:15   ECHOCARDIOGRAM COMPLETE  Result Date: 06/17/2022    ECHOCARDIOGRAM REPORT   Patient Name:   [facility] Date of Exam: 06/17/2022 Medical Rec #:  [id number]       Height:       67.0 in  Accession #:    [id number]      Weight:       147.9 lb Date of Birth:  1961-06-21       BSA:          1.779 m Patient Age:    61 years        BP:           185/88 mmHg Patient Gender: F               HR:           83 bpm. Exam Location:  Inpatient Procedure: 2D Echo, Cardiac Doppler and Color  Doppler Indications:    Congestive Heart Failure I50.9  History:        Patient has prior history of Echocardiogram examinations, most                 recent 03/14/2022. CHF, Stroke; Risk Factors:Hypertension,                 Diabetes and Dyslipidemia. Small cell lung cancer, right upper                 lobe ([place]).  Sonographer:    Alvino Chapel RCS Referring Phys: [place]  1. Compared with the echo 02/2022, pericardial effusion is larger.  2. Left ventricular ejection fraction, by estimation, is 35 to 40%. The left ventricle has mild to moderately decreased function. The left ventricle demonstrates regional wall motion abnormalities (see scoring diagram/findings for description). There is  moderate concentric left ventricular hypertrophy. Left ventricular diastolic parameters are consistent with Grade I diastolic dysfunction (impaired relaxation).  3. Right ventricular systolic function is normal. The right ventricular size is normal. There is mildly elevated pulmonary artery systolic pressure.  4. Left atrial size was severely dilated.  5. Large pericardial effusion measuring up to 1.8 cm. Large pericardial effusion. The pericardial effusion is circumferential. Findings are consistent with cardiac tamponade. Large pleural effusion in the left lateral region.  6. The mitral valve is normal in structure. Mild mitral valve regurgitation. No evidence of mitral stenosis.  7. The aortic valve is tricuspid. There is mild calcification of the aortic valve. There is mild thickening of the aortic valve. Aortic valve regurgitation is not visualized. No aortic stenosis is present.  8. The inferior vena cava is dilated in size with <50% respiratory variability, suggesting right atrial pressure of 15 mmHg. FINDINGS  Left Ventricle: Left ventricular ejection fraction, by estimation, is 35 to 40%. The left ventricle has mild to moderately decreased function. The left ventricle demonstrates regional wall  motion abnormalities. The left ventricular internal cavity size was normal in size. There is moderate concentric left ventricular hypertrophy. Left ventricular diastolic parameters are consistent with Grade I diastolic dysfunction (impaired relaxation).  LV Wall Scoring: The entire anterior wall, entire lateral wall, inferior septum, and entire inferior wall are hypokinetic. The anterior septum and apex are normal. Right Ventricle: The right ventricular size is normal. No increase in right ventricular wall thickness. Right ventricular systolic function is normal. There is mildly elevated pulmonary artery systolic pressure. The tricuspid regurgitant velocity is 2.52  m/s, and with an assumed right atrial pressure of 15 mmHg, the estimated right ventricular systolic pressure is 37.3 mmHg. Left Atrium: Left atrial size was severely dilated. Right Atrium: Right atrial size was normal in size. Pericardium: Large pericardial effusion measuring up to 1.8 cm. A large pericardial effusion is present. The pericardial effusion is circumferential.  There is diastolic collapse of the right ventricular free wall, diastolic collapse of the right atrial wall and excessive respiratory variation in the tricuspid valve spectral Doppler velocities. There is evidence of cardiac tamponade. Mitral Valve: The mitral valve is normal in structure. There is mild thickening of the anterior mitral valve leaflet(s). Mild mitral valve regurgitation. No evidence of mitral valve stenosis. Tricuspid Valve: The tricuspid valve is normal in structure. Tricuspid valve regurgitation is mild . No evidence of tricuspid stenosis. Aortic Valve: The aortic valve is tricuspid. There is mild calcification of the aortic valve. There is mild thickening of the aortic valve. Aortic valve regurgitation is not visualized. No aortic stenosis is present. Pulmonic Valve: The pulmonic valve was normal in structure. Pulmonic valve regurgitation is mild. No evidence of  pulmonic stenosis. Aorta: The aortic root is normal in size and structure. Venous: The inferior vena cava is dilated in size with less than 50% respiratory variability, suggesting right atrial pressure of 15 mmHg. IAS/Shunts: No atrial level shunt detected by color flow Doppler. Additional Comments: Findings are consistent with tamponade. There is a large pleural effusion in the left lateral region.  LEFT VENTRICLE PLAX 2D LVIDd:         4.80 cm      Diastology LVIDs:         4.10 cm      LV e' medial:    4.43 cm/s LV PW:         1.70 cm      LV E/e' medial:  25.5 LV IVS:        1.50 cm      LV e' lateral:   3.90 cm/s LVOT diam:     1.90 cm      LV E/e' lateral: 29.0 LV SV:         61 LV SV Index:   34 LVOT Area:     2.84 cm  LV Volumes (MOD) LV vol d, MOD A2C: 155.0 ml LV vol d, MOD A4C: 148.0 ml LV vol s, MOD A2C: 72.2 ml LV vol s, MOD A4C: 83.8 ml LV SV MOD A2C:     82.8 ml LV SV MOD A4C:     148.0 ml LV SV MOD BP:      72.7 ml RIGHT VENTRICLE RV S prime:     8.78 cm/s TAPSE (M-mode): 2.2 cm LEFT ATRIUM             Index        RIGHT ATRIUM           Index LA diam:        4.00 cm 2.25 cm/m   RA Area:     16.10 cm LA Vol (A2C):   96.3 ml 54.13 ml/m  RA Volume:   42.30 ml  23.78 ml/m LA Vol (A4C):   77.6 ml 43.62 ml/m LA Biplane Vol: 87.8 ml 49.35 ml/m  AORTIC VALVE LVOT Vmax:   101.00 cm/s LVOT Vmean:  63.800 cm/s LVOT VTI:    0.214 m  AORTA Ao Root diam: 3.50 cm MITRAL VALVE                  TRICUSPID VALVE MV Area (PHT): 4.06 cm       TR Peak grad:   25.4 mmHg MV Decel Time: 187 msec       TR Vmax:        252.00 cm/s MR Peak grad:    132.2 mmHg MR Mean  grad:    82.0 mmHg    SHUNTS MR Vmax:         575.00 cm/s  Systemic VTI:  0.21 m MR Vmean:        415.0 cm/s   Systemic Diam: 1.90 cm MR PISA:         3.08 cm MR PISA Eff ROA: 13 mm MR PISA Radius:  0.70 cm MV E velocity: 113.00 cm/s MV A velocity: 148.00 cm/s MV E/A ratio:  0.76 Skeet Latch MD Electronically signed by Skeet Latch MD Signature  Date/Time: 06/17/2022/2:28:32 PM    Final    US RENAL  Result Date: 06/17/2022 CLINICAL DATA:  Acute renal insufficiency. EXAM: RENAL / URINARY TRACT ULTRASOUND COMPLETE COMPARISON:  March 14, 2022. FINDINGS: Right Kidney: Renal measurements: 12.2 x 7.0 x 6.4 cm = volume: 240 mL. Increased echogenicity of renal parenchyma is noted. 1.3 cm simple cyst is seen in upper pole. No mass or hydronephrosis visualized. Left Kidney: Renal measurements: 12.4 x 6.6 x 6.3 cm = volume: 269 mL. Increased echogenicity of renal parenchyma is noted. No mass or hydronephrosis visualized. Bladder: Appears normal for degree of bladder distention. Other: None. IMPRESSION: Increased echogenicity of renal parenchyma is noted bilaterally consistent with medical renal disease. No hydronephrosis or renal obstruction is noted. Electronically Signed   By: Marijo Conception M.D.   On: 06/17/2022 13:57    Cardiac Studies   See echo above  Patient Profile     61 y.o. female with a hx of DM, HTN, HLD, CVA, tob use, CHF, CKD III, depression, SCLC, COPD, hypoxia on home O2, who is being seen 06/17/2022 for the evaluation of peric eff at the request of Dr Johnnye Sima.   Assessment & Plan    Hemodynamically well compensated. Expect it will be another couple of days to get results of cytology/pathology from the pericardial sample. If there is no evidence of pericardial malignancy, consider a course of anti-inflammatory medications, but this is problematic (cannot use NSAIDs, colchicine is an issue with her abnormal renal function, steroids will be an issue with diabetes mellitus).     For questions or updates, please contact [place] Please consult [email or web address] for contact info under        Signed, Sanda Klein, MD  06/19/2022, 10:18 AM  '

## 2022-06-19 NOTE — Progress Notes (Addendum)
TCTS DAILY ICU PROGRESS NOTE                   [address] [place]            [place] [address]          [phone]   1 Day Post-Op Procedure(s) (LRB): SUBXYPHOID PERICARDIAL WINDOW (N/A) TRANSESOPHAGEAL ECHOCARDIOGRAM (TEE) (N/A) CHEST TUBE INSERTION (Right)  Total Length of Stay:  LOS: 3 days   Subjective: Some incisional discomfort  Objective: Vital signs in last 24 hours: Temp:  [94.2 F (34.6 C)-97.7 F (36.5 C)] 97.6 F (36.4 C) (11/22 0400) Pulse Rate:  [59-80] 62 (11/22 0700) Cardiac Rhythm: Normal sinus rhythm (11/22 0400) Resp:  [8-20] 20 (11/22 0700) BP: (95-129)/(53-88) 128/63 (11/22 0700) SpO2:  [94 %-100 %] 95 % (11/22 0700) Arterial Line BP: (115-202)/(44-90) 167/63 (11/22 0700) Weight:  [66.1 kg] 66.1 kg (11/22 0410)  Filed Weights   06/17/22 0500 06/18/22 0400 06/19/22 0410  Weight: 67.1 kg 65.8 kg 66.1 kg    Weight change: 0.3 kg   Hemodynamic parameters for last 24 hours:    Intake/Output from previous day: 11/21 0701 - 11/22 0700 In: 2954.3 [I.V.:2816.6; IV Piggyback:137.6] Out: 2265 [Urine:490; Blood:25; Chest Tube:1750]  Intake/Output this shift: No intake/output data recorded.  Current Meds: Scheduled Meds:  acetaminophen  1,000 mg Oral Q6H   Or   acetaminophen (TYLENOL) oral liquid 160 mg/5 mL  1,000 mg Oral Q6H   amLODipine  10 mg Oral Daily   atorvastatin  80 mg Oral Daily   bisacodyl  10 mg Oral Daily   carvedilol  25 mg Oral BID WC   Chlorhexidine Gluconate Cloth  6 each Topical Daily   enoxaparin (LOVENOX) injection  30 mg Subcutaneous Daily   gabapentin  100 mg Oral QHS   hydrALAZINE  75 mg Oral Q8H   insulin aspart  0-9 Units Subcutaneous Q4H   isosorbide mononitrate  30 mg Oral Daily   mupirocin ointment  1 Application Nasal BID   pantoprazole  40 mg Oral Daily   senna-docusate  1 tablet Oral QHS   Continuous Infusions:  sodium chloride 75 mL/hr at 06/19/22 0700   albumin human Stopped (06/19/22 0456)    PRN Meds:.HYDROmorphone (DILAUDID) injection, labetalol, nitroGLYCERIN, ondansetron (ZOFRAN) IV, oxyCODONE, traZODone  General appearance: alert, cooperative, and no distress Heart: regular rate and rhythm Lungs: dim in bases Abdomen: soft, non tender Extremities: some ankle/feet edema Wound: dressing CDI  Lab Results: CBC: Recent Labs    06/18/22 0617 06/18/22 0855 06/19/22 0355 06/19/22 0408  WBC 7.0  --  14.3*  --   HGB 10.0*   < > 9.1* 9.2*  HCT 29.5*   < > 29.1* 27.0*  PLT 266  --  270  --    < > = values in this interval not displayed.   BMET:  Recent Labs    06/18/22 1648 06/19/22 0355 06/19/22 0408  NA 141 138 139  K 3.8 3.8 3.9  CL 110 110  --   CO2 18* 19*  --   GLUCOSE 134* 112*  --   BUN 54* 55*  --   CREATININE 5.22* 5.30*  --   CALCIUM 7.5* 7.2*  --     CMET: Lab Results  Component Value Date   WBC 14.3 (H) 06/19/2022   HGB 9.2 (L) 06/19/2022   HCT 27.0 (L) 06/19/2022   PLT 270 06/19/2022   GLUCOSE 112 (H) 06/19/2022   CHOL 226 (  H) 01/28/2022   TRIG 194 (H) 01/28/2022   HDL 44 01/28/2022   LDLCALC 143 (H) 01/28/2022   ALT 14 06/16/2022   AST 19 06/16/2022   NA 139 06/19/2022   K 3.9 06/19/2022   CL 110 06/19/2022   CREATININE 5.30 (H) 06/19/2022   BUN 55 (H) 06/19/2022   CO2 19 (L) 06/19/2022   TSH 1.694 01/29/2022   INR 1.1 06/17/2022   HGBA1C 9.7 (H) 01/28/2022      PT/INR:  Recent Labs    06/17/22 1948  LABPROT 14.5  INR 1.1   Radiology: [facility] Chest Port 1 View  Result Date: 06/18/2022 CLINICAL DATA:  Status post CABG EXAM: PORTABLE CHEST 1 VIEW COMPARISON:  CXR 06/16/22, CT chest 06/17/22 FINDINGS: Status post drainage of a large pericardial effusion. Endotracheal terminates 5 cm above the carina. Right-sided thoracostomy tube and likely a pericardial drain in place. Compared to prior exam cardiac contours slightly decreased in size. Linear opacity in the right lung apex favored to represent scar. No pneumothorax. No focal  airspace opacity. Displaced rib fractures. Visualized upper abdomen is unremarkable. IMPRESSION: 1. Endotracheal tube terminates 5 cm above the carina. 2. Right sided thoracostomy tube in place.  no pneumothorax. Electronically Signed   By: Marin Roberts M.D.   On: 06/18/2022 10:05     Assessment/Plan: S/P Procedure(s) (LRB): SUBXYPHOID PERICARDIAL WINDOW (N/A) TRANSESOPHAGEAL ECHOCARDIOGRAM (TEE) (N/A) CHEST TUBE INSERTION (Right) POD#1 1 afeb, VSS , art line shows wide variation in BP , sinus rhythm 2 O2 sats good on 2 liters, ABG mixed acidosis 3 chest tube- 1750 cc/24/h 4 fair UOP, acute on chronic kidney disease, hopefully will improve with medical stabilization of shock and improvement in HTN control 5 BS well controlled 7 leukocytosis, prob reactive, monitor for s/s of infection 8 anemia is chronic and variable over past several days. 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

## 2022-06-19 NOTE — Progress Notes (Signed)
Pharmacist Heart Failure Core Measure Documentation  Assessment: Amber Cross has an EF documented as 35-40% on 06/17/22 by Echo.  Rationale: Heart failure patients with left ventricular systolic dysfunction (LVSD) and an EF < 40% should be prescribed an angiotensin converting enzyme inhibitor (ACEI) or angiotensin receptor blocker (ARB) at discharge unless a contraindication is documented in the medical record.  This patient is not currently on an ACEI or ARB for HF.  This note is being placed in the record in order to provide documentation that a contraindication to the use of these agents is present for this encounter.  ACE Inhibitor or Angiotensin Receptor Blocker is contraindicated (specify all that apply)  []   ACEI allergy AND ARB allergy []   Angioedema []   Moderate or severe aortic stenosis []   Hyperkalemia []   Hypotension []   Renal artery stenosis [x]   Worsening renal function, preexisting renal disease or dysfunction  Hildred Laser, PharmD Clinical Pharmacist **Pharmacist phone directory can now be found on [email or web address] ([email or web address]).  Listed under [place].

## 2022-06-19 NOTE — TOC Initial Note (Addendum)
Transition of Care [facility]) - Initial/Assessment Note    Patient Details  Name: Amber Cross MRN: [id number] Date of Birth: 12/14/1960  Transition [facility] CM/SW Contact:    Bethena Roys, RN Phone Number: 06/19/2022, 4:09 PM  Clinical Narrative:  Patient POD-1 pericardial window. PTA patient was from home with family support. Per God Daughter Larene Beach [phone]; patient lives with her son, brother, and his girlfriend. Patient has DME rolling walker and oxygen. Family states oxygen is via [employer]. Case Manager called [employer] and [employer] to see which company she is active with for oxygen. Patient is actually active with [employer] in [place] @ [phone]. Larene Beach reports that her portable tanks are empty at home. Case Manager did call [employer] for DME oxygen; spoke with [facility] to verify. Patient has an order for 2 liters and has a concentrator. Family has to call to get portable tanks delivered to the home. Case Manager did make Larene Beach aware to call [employer]PS. [facility]PS will be closed on Thursday and Friday of this week; however, after hours are available for any needs. Larene Beach asked about personal care services- Case Manager explained that PCS is an out of pocket cost that ranges from $21.00-$29.00 an hour unless the patient has a long term care policy vs Medicaid. Case Manager suggested family check with DSS to see if the patient is eligible for Medicaid. Per Larene Beach, the patient has submitted for disability; however, has not heard anything. Case Manager asked for PT/OT consult for recommendations. Staff RN aware to call the chaplain for notary. Case Manager will continue to follow for additional transition of care needs.              Expected Discharge Plan: [place] Barriers to Discharge: Continued Medical Work up  Expected Discharge Plan and Services Expected Discharge Plan: [place] In-house Referral: NA Discharge Planning Services: CM Consult   Living  arrangements for the past 2 months: Single Family Home                  Prior Living Arrangements/Services Living arrangements for the past 2 months: Single Family Home Lives with:: Relatives Patient language and need for interpreter reviewed:: Yes Do you feel safe going back to the place where you live?: Yes      Need for Family Participation in Patient Care: Yes (Comment) Care giver support system in place?: Yes (comment) Current home services: DME (Oxygen via [facility]- and Rolling walker.) Criminal Activity/Legal Involvement Pertinent to Current Situation/Hospitalization: No - Comment as needed  Activities of Daily Living Home Assistive Devices/Equipment: Environmental consultant (specify type), Cane (specify quad or straight) ADL Screening (condition at time of admission) Patient's cognitive ability adequate to safely complete daily activities?: Yes Is the patient deaf or have difficulty hearing?: No Does the patient have difficulty seeing, even when wearing glasses/contacts?: No Does the patient have difficulty concentrating, remembering, or making decisions?: No Patient able to express need for assistance with ADLs?: Yes Does the patient have difficulty dressing or bathing?: No Independently performs ADLs?: Yes (appropriate for developmental age) Does the patient have difficulty walking or climbing stairs?: Yes Weakness of Legs: Both Weakness of Arms/Hands: None  Permission Sought/Granted Permission sought to share information with : Family Supports, Case Production designer, theatre/television/film granted to share info w AGENCY: Called [employer]  Psych Involvement: No (comment)  Admission diagnosis:  Acute on chronic systolic congestive heart failure ([place]) [I50.23] AKI (acute kidney injury) ([place]) [N17.9] Hypertensive  emergency [I16.1] Acute on chronic respiratory failure with hypoxia ([place]) [J96.21] Tamponade [I31.4] Patient Active Problem List   Diagnosis Date Noted   Tamponade 06/18/2022   Hypertensive  emergency 06/16/2022   AKI (acute kidney injury) ([place]) 03/14/2022   QT prolongation 03/14/2022   Pseudogout of knee, left 01/29/2022   Small cell lung cancer, right upper lobe ([place]) 01/01/2022   Healthcare maintenance 11/30/2021   History of CVA (cerebrovascular accident) 11/30/2021   Acute on chronic systolic congestive heart failure ([place]) 11/07/2021   Normocytic anemia 11/07/2021   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   Chest pain 05/07/2021   Hyperlipidemia LDL goal <100 04/16/2021   Atrophic vaginitis 05/08/2020   Diabetic neuropathy ([place])  07/20/2019   Tobacco use 07/20/2019   Essential hypertension 05/03/2019   Type 2 diabetes mellitus with diabetic polyneuropathy ([place]) 04/06/2019   PCP:  Starlyn Skeans, MD Pharmacy:   [facility] [phone] - [place] [place] [address] Phone: [phone] Fax: [facility] [address] [place] [address] Phone: [phone] Fax: [phone]  Readmission Risk Interventions     No data to display

## 2022-06-19 NOTE — Consult Note (Signed)
Nephrology Consult   Requesting provider: Dahlia Byes Service requesting consult: CT surgery Reason for consult: AKI on CKD   Assessment/Recommendations: Amber Cross is a/an 61 y.o. female with a past medical history HFrEF, HTN, DM2, small cell lung cancer, chronic hypoxic respiratory failure, and CKD IV who presents with acute on chronic hypoxic respiratory failure  AKI on CKD 4: Likely secondary to hemodynamic mediated process possibly some degree of cardiorenal syndrome.  Significant CKD related to diabetes. -Currently on MIVF, monitor respiratory status closely -Kidney function fairly stable today -Agree with IV albumin given significant hypoalbuminemia -Continue to monitor daily Cr, Dose meds for GFR -Monitor Daily I/Os, Daily weight  -Maintain MAP>65 for optimal renal perfusion.  -Avoid nephrotoxic medications including NSAIDs -Use synthetic opioids (Fentanyl/Dilaudid) if needed -Currently no indication for HD  Acute on chronic hypoxic respiratory failure: Continue with volume optimization as desired based on cardiac status.  Supplemental oxygen as needed. Overall improved  Pericardial effusion: Status post pericardial window on 06/18/2022.  Cardiothoracic surgery following.  Hypertension: Continue current medications and titrate as needed  HFrEF: Currently on IVFs. Monitor volume status closely  Metabolic acidosis: likely 2/2 CKD. CTM  Anemia: Hgb 9.2. Will obtain iron studies   Recommendations conveyed to primary service.    [facility] 06/19/2022 6:50 AM   _____________________________________________________________________________________ CC: SOB  History of Present Illness: Amber Cross is a/an 61 y.o. female with a past medical history of HFrEF, HTN, DM2, small cell lung cancer, chronic hypoxic respiratory failure, and CKD IV who presents with shortness of breath.  Patient initially presented on 11/19 with worsening  shortness of breath at home.  It seems that the acute component of her shortness of breath came on fairly quickly.  She has chronic hypoxic respiratory failure and is on 2 L nasal cannula chronically.  Overnight prior to admission she started having worsening shortness of breath that prompted her to come to the emergency department.  She has a chronic chest pain but nothing new or worsening around that time..  She denied fevers, chills, changes to cough, nausea, vomiting, diarrhea.  She did notice that her lower extremity edema had been better than usual but admitted to less ambulation.  She also noted a headache and some visual changes.  In the emergency department it was noted that she had significant hypertension.  Creatinine was up around 5 which was above her baseline of 3-3.3.  She was noted to have significant edema as well as pleural effusions and concern for a pericardial effusion.  She underwent aggressive blood pressure control.  Echocardiogram demonstrated ejection fraction of 35 to 40%.  It also demonstrated a enlarging pericardial effusion.  She ultimately was taken for pericardial window on 06/18/2022.  Creatinine has been fairly steady around 5 since admission.  She has chronic kidney disease and follows with Dr. Joelyn Oms in the outpatient setting.  She recently underwent kidney biopsy which demonstrated diabetic kidney disease with significant chronicity.  Patient is a little bit confused today.  She does have some pain in her chest where she had surgery but otherwise denies any other complaints.   Medications:  Current Facility-Administered Medications  Medication Dose Route Frequency Provider Last Rate Last Admin   0.45 % sodium chloride infusion   Intravenous Continuous Dahlia Byes, MD 75 mL/hr at 06/19/22 0400 Infusion Verify at 06/19/22 0400   acetaminophen (TYLENOL) tablet 1,000 mg  1,000 mg Oral Q6H Stehler, Bailey C, PA-C   1,000 mg at 06/19/22 [phone]  Or   acetaminophen  (TYLENOL) 160 MG/5ML solution 1,000 mg  1,000 mg Oral Q6H Stehler, Bailey C, PA-C       albumin human 25 % solution 12.5 g  12.5 g Intravenous Q6H Dahlia Byes, MD 60 mL/hr at 06/19/22 0447 12.5 g at 06/19/22 0447   amLODipine (NORVASC) tablet 10 mg  10 mg Oral Daily Magdalene River, PA-C   10 mg at 06/18/22 1211   atorvastatin (LIPITOR) tablet 80 mg  80 mg Oral Daily Barrett, Erin R, PA-C       bisacodyl (DULCOLAX) EC tablet 10 mg  10 mg Oral Daily Stehler, Bailey C, PA-C       carvedilol (COREG) tablet 25 mg  25 mg Oral BID WC Stehler, Bailey C, PA-C   25 mg at 06/18/22 1546   Chlorhexidine Gluconate Cloth 2 % PADS 6 each  6 each Topical Daily Dahlia Byes, MD   6 each at 06/18/22 1145   enoxaparin (LOVENOX) injection 30 mg  30 mg Subcutaneous Daily Dahlia Byes, MD       gabapentin (NEURONTIN) capsule 100 mg  100 mg Oral QHS Stehler, Pricilla Larsson, PA-C   100 mg at 06/18/22 2123   hydrALAZINE (APRESOLINE) tablet 75 mg  75 mg Oral Q8H Patel, Amar, DO   75 mg at 06/19/22 3428   HYDROmorphone (DILAUDID) injection 0.5-1 mg  0.5-1 mg Intravenous Q4H PRN Barrett, Erin R, PA-C   1 mg at 06/18/22 1136   insulin aspart (novoLOG) injection 0-9 Units  0-9 Units Subcutaneous Q4H Magdalene River, PA-C   1 Units at 06/19/22 0007   isosorbide mononitrate (IMDUR) 24 hr tablet 30 mg  30 mg Oral Daily Stehler, Bailey C, PA-C       labetalol (NORMODYNE) injection 10 mg  10 mg Intravenous Q2H PRN Dahlia Byes, MD   10 mg at 06/18/22 2154   mupirocin ointment (BACTROBAN) 2 % 1 Application  1 Application Nasal BID Dahlia Byes, MD       nitroGLYCERIN (NITROSTAT) SL tablet 0.4 mg  0.4 mg Sublingual Q5 min PRN Stehler, Pricilla Larsson, PA-C       ondansetron [facility]) injection 4 mg  4 mg Intravenous Q6H PRN Dahlia Byes, MD   4 mg at 06/18/22 1148   oxyCODONE (Oxy IR/ROXICODONE) immediate release tablet 5-10 mg  5-10 mg Oral Q4H PRN Wynelle Beckmann C, PA-C   10 mg at 06/19/22 7681   pantoprazole (PROTONIX) EC  tablet 40 mg  40 mg Oral Daily Magdalene River, PA-C   40 mg at 06/18/22 1547   senna-docusate (Senokot-S) tablet 1 tablet  1 tablet Oral QHS Stehler, Bailey C, PA-C       traZODone (DESYREL) tablet 50 mg  50 mg Oral QHS PRN Wynelle Beckmann C, PA-C   50 mg at 06/18/22 2154     ALLERGIES Penicillins, Strawberry (diagnostic), and Tomato  MEDICAL HISTORY Past Medical History:  Diagnosis Date   Anemia    Arthritis    CHF (congestive heart failure) ([place])    Chronic kidney disease    Depression    Diabetes mellitus    Headache    Hypercholesteremia    Hypertension    Pseudogout    Stroke ([place]) 10/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   Tobacco abuse      SOCIAL HISTORY Social History   Socioeconomic History   Marital status: Married    Spouse name: Not on file   Number of children: Not on file  Years of education: Not on file   Highest education level: Not on file  Occupational History   Not on file  Tobacco Use   Smoking status: Every Day    Packs/day: 0.50    Years: 40.00    Total pack years: 20.00    Types: Cigarettes   Smokeless tobacco: Never  Vaping Use   Vaping Use: Never used  Substance and Sexual Activity   Alcohol use: Not Currently   Drug use: Yes    Frequency: 4.0 times per week    Types: Marijuana    Comment: smokes every other day   Sexual activity: Not Currently    Birth control/protection: Abstinence  Other Topics Concern   Not on file  Social History Narrative   Not on file   Social Determinants of Health   Financial Resource Strain: Medium Risk (02/28/2022)   Overall Financial Resource Strain (CARDIA)    Difficulty of Paying Living Expenses: Somewhat hard  Food Insecurity: No Food Insecurity (06/16/2022)   Hunger Vital Sign    Worried About Running Out of Food in the Last Year: Never true    Ran Out of Food in the Last Year: Never true  Transportation Needs: Unmet Transportation Needs (06/16/2022)   PRAPARE - Transportation    Lack of Transportation  (Medical): Yes    Lack of Transportation (Non-Medical): Yes  Physical Activity: Not on file  Stress: Stress Concern Present (02/28/2022)   [employer] [place]    Feeling of Stress : To some extent  Social Connections: Not on file  Intimate Partner Violence: Not At Risk (06/16/2022)   Humiliation, Afraid, Rape, and Kick questionnaire    Fear of Current or Ex-Partner: No    Emotionally Abused: No    Physically Abused: No    Sexually Abused: No     FAMILY HISTORY Family History  Problem Relation Age of Onset   Diabetes Mother    Hypertension Mother    Hypertension Father       Review of Systems: 12 systems reviewed Otherwise as per HPI, all other systems reviewed and negative  Physical Exam: Vitals:   06/19/22 0300 06/19/22 0400  BP: (!) 99/58 (!) 108/58  Pulse: 64 64  Resp: 10 (!) 9  Temp:  97.6 F (36.4 C)  SpO2: 95% 95%   Total I/O In: 1136.3 [I.V.:999.2; IV Piggyback:137.1] Out: 510 [Urine:160; Chest Tube:350]  Intake/Output Summary (Last 24 hours) at 06/19/2022 0650 Last data filed at 06/19/2022 0400 Gross per 24 hour  Intake 2728.62 ml  Output 1985 ml  Net 743.62 ml   General: well-appearing, intermittent distress related to pain HEENT: anicteric sclera, oropharynx clear without lesions CV: Normal rate, no edema in the lower extremities Lungs: Decreased breath sounds at the bases, minimal crackles, normal work of breathing Abd: soft, non-tender, non-distended Skin: no visible lesions or rashes Psych: alert, engaged, appropriate mood and affect Musculoskeletal: no obvious deformities Neuro: normal speech, no gross focal deficits   Test Results Reviewed Lab Results  Component Value Date   NA 139 06/19/2022   K 3.9 06/19/2022   CL 110 06/19/2022   CO2 19 (L) 06/19/2022   BUN 55 (H) 06/19/2022   CREATININE 5.30 (H) 06/19/2022   CALCIUM 7.2 (L) 06/19/2022   ALBUMIN 1.7 (L) 06/18/2022   PHOS 7.0  (H) 06/18/2022    CBC Recent Labs  Lab 06/16/22 0955 06/16/22 1012 06/17/22 0960 06/18/22 0617 06/18/22 0855 06/18/22 1645 06/19/22 0355 06/19/22 0408  WBC 11.1*  --  6.1 7.0  --   --  14.3*  --   NEUTROABS 9.3*  --   --  5.4  --   --   --   --   HGB 10.0*   < > 7.8* 10.0*   < > 10.9* 9.1* 9.2*  HCT 31.8*   < > 24.4* 29.5*   < > 32.0* 29.1* 27.0*  MCV 86.9  --  88.4 85.0  --   --  89.3  --   PLT 440*  --  303 266  --   --  270  --    < > = values in this interval not displayed.    I have reviewed all relevant outside healthcare records related to the patient's current hospitalization

## 2022-06-19 NOTE — Anesthesia Postprocedure Evaluation (Signed)
Anesthesia Post Note  Patient: Amber Cross  Procedure(s) Performed: SUBXYPHOID PERICARDIAL WINDOW TRANSESOPHAGEAL ECHOCARDIOGRAM (TEE) CHEST TUBE INSERTION (Right: Chest)     Patient location during evaluation: PACU Anesthesia Type: General Level of consciousness: awake and alert Pain management: pain level controlled Vital Signs Assessment: post-procedure vital signs reviewed and stable Respiratory status: spontaneous breathing, nonlabored ventilation, respiratory function stable and patient connected to nasal cannula oxygen Cardiovascular status: blood pressure returned to baseline and stable Postop Assessment: no apparent nausea or vomiting Anesthetic complications: no   No notable events documented.  Last Vitals:  Vitals:   06/19/22 0649 06/19/22 0700  BP:  128/63  Pulse: 65 62  Resp: 12 20  Temp:  (!) 36.1 C  SpO2: 96% 95%    Last Pain:  Vitals:   06/19/22 0649  TempSrc:   PainSc: 10-Worst pain ever                 Bernhard Koskinen S

## 2022-06-19 NOTE — Progress Notes (Signed)
CT surgery p.m. rounds  History of small cell right lung cancer admitted for moderate pericardial effusion and moderate right pleural effusion with shortness of breath and pretamponade physiology.  500 mL drainage of pericardial fluid-cytology and pathology of tissue still pending  700 mL drainage of right pleural effusion-cytology negative for malignancy  Chest tube output has started to diminish. Patient has chronic kidney disease with baseline creatinine of 3.4 and significant proteinuria and was seen by renal due to increasing creatinine up to 5.5, appreciate their recommendations  Had problems with pain and nausea today and is now resting after Phenergan  Blood pressure (!) 87/42, pulse 68, temperature (!) 96.3 F (35.7 C), temperature source Axillary, resp. rate 12, height 5\' 7"  (1.702 m), weight 66.1 kg, last menstrual period 09/29/2011, SpO2 96 %.

## 2022-06-20 ENCOUNTER — Inpatient Hospital Stay (HOSPITAL_COMMUNITY): Payer: Commercial Managed Care - HMO

## 2022-06-20 DIAGNOSIS — I161 Hypertensive emergency: Secondary | ICD-10-CM | POA: Diagnosis not present

## 2022-06-20 LAB — COMPREHENSIVE METABOLIC PANEL
ALT: <5 U/L (ref 0–44)
AST: 7 U/L — ABNORMAL LOW (ref 15–41)
Albumin: 2.6 g/dL — ABNORMAL LOW (ref 3.5–5.0)
Alkaline Phosphatase: 73 U/L (ref 38–126)
Anion gap: 16 — ABNORMAL HIGH (ref 5–15)
BUN: 59 mg/dL — ABNORMAL HIGH (ref 6–20)
CO2: 16 mmol/L — ABNORMAL LOW (ref 22–32)
Calcium: 7.7 mg/dL — ABNORMAL LOW (ref 8.9–10.3)
Chloride: 106 mmol/L (ref 98–111)
Creatinine, Ser: 5.64 mg/dL — ABNORMAL HIGH (ref 0.44–1.00)
GFR, Estimated: 8 mL/min — ABNORMAL LOW (ref >60)
Glucose, Bld: 87 mg/dL (ref 70–99)
Potassium: 3.8 mmol/L (ref 3.5–5.1)
Sodium: 138 mmol/L (ref 135–145)
Total Bilirubin: 0.1 mg/dL — ABNORMAL LOW (ref 0.3–1.2)
Total Protein: 4.6 g/dL — ABNORMAL LOW (ref 6.5–8.1)

## 2022-06-20 LAB — CBC
HCT: 26.4 % — ABNORMAL LOW (ref 36.0–46.0)
Hemoglobin: 8.4 g/dL — ABNORMAL LOW (ref 12.0–15.0)
MCH: 28.1 pg (ref 26.0–34.0)
MCHC: 31.8 g/dL (ref 30.0–36.0)
MCV: 88.3 fL (ref 80.0–100.0)
Platelets: 217 10*3/uL (ref 150–400)
RBC: 2.99 MIL/uL — ABNORMAL LOW (ref 3.87–5.11)
RDW: 16.8 % — ABNORMAL HIGH (ref 11.5–15.5)
WBC: 10.6 10*3/uL — ABNORMAL HIGH (ref 4.0–10.5)
nRBC: 0 % (ref 0.0–0.2)

## 2022-06-20 LAB — GLUCOSE, CAPILLARY
Glucose-Capillary: 111 mg/dL — ABNORMAL HIGH (ref 70–99)
Glucose-Capillary: 241 mg/dL — ABNORMAL HIGH (ref 70–99)
Glucose-Capillary: 69 mg/dL — ABNORMAL LOW (ref 70–99)
Glucose-Capillary: 74 mg/dL (ref 70–99)
Glucose-Capillary: 93 mg/dL (ref 70–99)
Glucose-Capillary: 131 mg/dL — ABNORMAL HIGH (ref 70–99)

## 2022-06-20 LAB — RENAL FUNCTION PANEL
Albumin: 3 g/dL — ABNORMAL LOW (ref 3.5–5.0)
Anion gap: 14 (ref 5–15)
BUN: 61 mg/dL — ABNORMAL HIGH (ref 6–20)
CO2: 15 mmol/L — ABNORMAL LOW (ref 22–32)
Calcium: 7.2 mg/dL — ABNORMAL LOW (ref 8.9–10.3)
Chloride: 108 mmol/L (ref 98–111)
Creatinine, Ser: 5.62 mg/dL — ABNORMAL HIGH (ref 0.44–1.00)
GFR, Estimated: 8 mL/min — ABNORMAL LOW (ref >60)
Glucose, Bld: 66 mg/dL — ABNORMAL LOW (ref 70–99)
Phosphorus: 7.7 mg/dL — ABNORMAL HIGH (ref 2.5–4.6)
Potassium: 4.2 mmol/L (ref 3.5–5.1)
Sodium: 137 mmol/L (ref 135–145)

## 2022-06-20 MED ORDER — ORAL CARE MOUTH RINSE: 15.0000 mL | OROMUCOSAL | Status: DC | PRN

## 2022-06-20 NOTE — Progress Notes (Signed)
Primary RN verified with Dr. Lavonna Monarch that he wants both MT and PT discontinued. Verified no air leak present, output scant in [place].  Both CTs removed at 2100 by primary RN Maris Berger with myself assisting. Sutures remain, tied off at sites. Dressed and taped. Pt tolerated well, no change in assessment or VS. Educated on s/sx to report to primary RN.  Henreitta Leber, RN 06/20/22

## 2022-06-20 NOTE — Progress Notes (Signed)
      [address]       ,[place] [address]             [phone]      2 Days Post-Op  Procedure(s) (LRB): SUBXYPHOID PERICARDIAL WINDOW (N/A) TRANSESOPHAGEAL ECHOCARDIOGRAM (TEE) (N/A) CHEST TUBE INSERTION (Right)   Total Length of Stay:  LOS: 4 days   SUBJECTIVE: Feels well No issues   Vitals:   06/20/22 0700 06/20/22 0800  BP:  121/61  Pulse: 72 72  Resp: 15 13  Temp:    SpO2: 100% 100%    Intake/Output      11/22 0701 11/23 0700 11/23 0701 11/24 0700   I.V. (mL/kg) 1477.4 (22.4) 33.8 (0.5)   IV Piggyback 253.4    Total Intake(mL/kg) 1730.8 (26.2) 33.8 (0.5)   Urine (mL/kg/hr) 130 (0.1)    Stool 0    Blood     Chest Tube 360    Total Output 490    Net +1240.8 +33.8        Stool Occurrence 1 x        albumin human Stopped (06/20/22 0656)   promethazine (PHENERGAN) injection (IM or IVPB)      CBC    Component Value Date/Time   WBC 10.6 (H) 06/20/2022 0320   RBC 2.99 (L) 06/20/2022 0320   HGB 8.4 (L) 06/20/2022 0320   HGB 6.9 (LL) 12/11/2021 1626   HCT 26.4 (L) 06/20/2022 0320   HCT 21.0 (L) 12/11/2021 1626   PLT 217 06/20/2022 0320   PLT 307 12/11/2021 1626   MCV 88.3 06/20/2022 0320   MCV 87 12/11/2021 1626   MCH 28.1 06/20/2022 0320   MCHC 31.8 06/20/2022 0320   RDW 16.8 (H) 06/20/2022 0320   RDW 13.9 12/11/2021 1626   LYMPHSABS 1.0 06/18/2022 0617   MONOABS 0.5 06/18/2022 0617   EOSABS 0.1 06/18/2022 0617   BASOSABS 0.0 06/18/2022 0617   CMP     Component Value Date/Time   NA 138 06/20/2022 0320   NA 138 02/20/2022 1207   K 3.8 06/20/2022 0320   CL 106 06/20/2022 0320   CO2 16 (L) 06/20/2022 0320   GLUCOSE 87 06/20/2022 0320   BUN 59 (H) 06/20/2022 0320   BUN 25 02/20/2022 1207   CREATININE 5.64 (H) 06/20/2022 0320   CALCIUM 7.7 (L) 06/20/2022 0320   PROT 4.6 (L) 06/20/2022 0320   ALBUMIN 2.6 (L) 06/20/2022 0320   AST 7 (L) 06/20/2022 0320   ALT <5 06/20/2022 0320   ALKPHOS 73 06/20/2022 0320    BILITOT 0.1 (L) 06/20/2022 0320   GFRNONAA 8 (L) 06/20/2022 0320   GFRAA 120 04/06/2019 1506   ABG    Component Value Date/Time   PHART 7.240 (L) 06/19/2022 0408   PCO2ART 44.6 06/19/2022 0408   PO2ART 70 (L) 06/19/2022 0408   HCO3 19.2 (L) 06/19/2022 0408   TCO2 21 (L) 06/19/2022 0408   ACIDBASEDEF 8.0 (H) 06/19/2022 0408   O2SAT 91 06/19/2022 0408   CBG (last 3)  Recent Labs    06/19/22 1917 06/19/22 2259 06/20/22 0754  GLUCAP 126* 96 111*     ASSESSMENT: POD #2 sp drainage of pericardium/right pleural space 360 cc out yesterday. If drainage under 100cc next shift will remove CXR tomorrow to follow possible R apical pneumo   Coralie Common, MD @DATE @

## 2022-06-20 NOTE — Progress Notes (Signed)
     [address]       [place] [address]             [phone]       EVENING ROUNDS  CT with minimal output DC CT tonite

## 2022-06-20 NOTE — Progress Notes (Signed)
Rounding Note    Patient Name: Amber Cross Date of Encounter: 06/20/2022  [place] Cardiologist: Berniece Salines, DO   Subjective   A little sore. Overall feels OK.  Inpatient Medications    Scheduled Meds:  acetaminophen  1,000 mg Oral Q6H   Or   acetaminophen (TYLENOL) oral liquid 160 mg/5 mL  1,000 mg Oral Q6H   atorvastatin  80 mg Oral Daily   bisacodyl  10 mg Oral Daily   carvedilol  12.5 mg Oral BID WC   Chlorhexidine Gluconate Cloth  6 each Topical Daily   gabapentin  100 mg Oral QHS   heparin injection (subcutaneous)  5,000 Units Subcutaneous Q8H   hydrALAZINE  25 mg Oral Q12H   insulin aspart  0-9 Units Subcutaneous TID WC & HS   isosorbide mononitrate  15 mg Oral Daily   metoCLOPramide (REGLAN) injection  10 mg Intravenous Q6H   pantoprazole  40 mg Oral Daily   senna-docusate  1 tablet Oral QHS   Continuous Infusions:  albumin human Stopped (06/20/22 0656)   promethazine (PHENERGAN) injection (IM or IVPB)     PRN Meds: nitroGLYCERIN, ondansetron (ZOFRAN) IV, mouth rinse, oxyCODONE, promethazine (PHENERGAN) injection (IM or IVPB), traZODone   Vital Signs    Vitals:   06/20/22 0500 06/20/22 0600 06/20/22 0700 06/20/22 0800  BP: (!) 115/55 (!) 119/59  121/61  Pulse: 70 73 72 72  Resp: 14 14 15 13   Temp:      TempSrc:      SpO2: 99% 99% 100% 100%  Weight: 66 kg     Height:        Intake/Output Summary (Last 24 hours) at 06/20/2022 0907 Last data filed at 06/20/2022 0800 Gross per 24 hour  Intake 1618.04 ml  Output 490 ml  Net 1128.04 ml      06/20/2022    5:00 AM 06/19/2022    4:10 AM 06/18/2022    4:00 AM  Last 3 Weights  Weight (lbs) 145 lb 8.1 oz 145 lb 11.6 oz 145 lb 1 oz  Weight (kg) 66 kg 66.1 kg 65.8 kg      Telemetry    NSR - Personally Reviewed  ECG    No new tracing - Personally Reviewed  Physical Exam  Looks comfortable GEN: No acute distress.   Neck: No JVD Cardiac: RRR, no murmurs, rubs, or gallops.   Respiratory: Clear to auscultation bilaterally. GI: Soft, nontender, non-distended  MS: No edema; No deformity. Neuro:  Nonfocal  Psych: Normal affect   Labs    High Sensitivity Troponin:   Recent Labs  Lab 06/16/22 0955 06/16/22 1240  TROPONINIHS 44* 51*     Chemistry Recent Labs  Lab 06/16/22 0955 06/16/22 1012 06/18/22 1648 06/19/22 0355 06/19/22 0408 06/19/22 1814 06/20/22 0320  NA 142   < > 141 138 139 140 138  K 3.8   < > 3.8 3.8 3.9 4.2 3.8  CL 116*   < > 110 110  --  108 106  CO2 18*   < > 18* 19*  --  18* 16*  GLUCOSE 211*   < > 134* 112*  --  149* 87  BUN 55*   < > 54* 55*  --  58* 59*  CREATININE 5.46*   < > 5.22* 5.30*  --  5.57* 5.64*  CALCIUM 8.0*   < > 7.5* 7.2*  --  7.5* 7.7*  PROT 6.6  --   --   --   --   --  4.6*  ALBUMIN 2.4*  --  1.7*  --   --  2.1* 2.6*  AST 19  --   --   --   --   --  7*  ALT 14  --   --   --   --   --  <5  ALKPHOS 194*  --   --   --   --   --  73  BILITOT 0.4  --   --   --   --   --  0.1*  GFRNONAA 8*   < > 9* 9*  --  8* 8*  ANIONGAP 8   < > 13 9  --  14 16*   < > = values in this interval not displayed.    Lipids No results for input(s): "CHOL", "TRIG", "HDL", "LABVLDL", "LDLCALC", "CHOLHDL" in the last 168 hours.  Hematology Recent Labs  Lab 06/18/22 0617 06/18/22 0855 06/19/22 0355 06/19/22 0408 06/20/22 0320  WBC 7.0  --  14.3*  --  10.6*  RBC 3.47*  --  3.26*  --  2.99*  HGB 10.0*   < > 9.1* 9.2* 8.4*  HCT 29.5*   < > 29.1* 27.0* 26.4*  MCV 85.0  --  89.3  --  88.3  MCH 28.8  --  27.9  --  28.1  MCHC 33.9  --  31.3  --  31.8  RDW 16.3*  --  16.6*  --  16.8*  PLT 266  --  270  --  217   < > = values in this interval not displayed.   Thyroid No results for input(s): "TSH", "FREET4" in the last 168 hours.  BNP Recent Labs  Lab 06/16/22 0955  BNP 3,614.4*    DDimer No results for input(s): "DDIMER" in the last 168 hours.   Radiology    DG Chest Port 1 View  Result Date: 06/20/2022 CLINICAL DATA:   Chest tube in place. History of pericardial window procedure and chest tube placement. EXAM: PORTABLE CHEST 1 VIEW COMPARISON:  06/19/2022 FINDINGS: Pericardial drain and right chest tube are in place. No large pneumothorax but questionable small pleural line at the right lung apex. Cardiac silhouette is stable in size. Persistent densities at the left lung base and retrocardiac space are suggestive for atelectasis and left pleural fluid. Slightly decreased atelectasis in the left lung. IMPRESSION: 1. Right chest tube and pericardial drain remain in place. Difficult to exclude tiny right apical pneumothorax as described. 2. Persistent densities at the left lung base and retrocardiac space. Electronically Signed   By: Markus Daft M.D.   On: 06/20/2022 08:23   DG Chest Port 1 View  Result Date: 06/19/2022 CLINICAL DATA:  Status post pericardial window creation. Chest tube present. EXAM: PORTABLE CHEST 1 VIEW COMPARISON:  AP chest 06/18/2022, CT chest 06/17/2022 and CT chest 01/03/2022 FINDINGS: Interval extubation. Cardiac silhouette is again moderately enlarged. Note is made of a moderate-to-large pericardial effusion on 06/17/2022 CT. There are again sided chest tubes overlying the lateral aspect of the mid height of the right hemithorax and the right infrahilar region. The more lateral chest tube tip projects slightly more superiorly compared to 06/18/2022, now at the mid height of the right hemithorax instead of the more inferior aspect of the right hemithorax. Moderate left pleural effusion with left basilar homogeneous airspace opacification, unchanged to mildly worsened from prior. Mild bilateral interstitial thickening is slightly improved from prior. No pneumothorax.  No acute skeletal abnormality. IMPRESSION: Compared  to 06/18/2022: 1. Interval extubation. 2. A right chest tube tip projects over the lateral right hemithorax, slightly more superior compared to 06/18/2022, now at the mid height of the  right hemithorax instead of the more inferior aspect of the right hemithorax. A right infrahilar possible pericardial drain appears unchanged in position. 3. Moderate left pleural effusion with left basilar airspace opacification, unchanged to mildly worsened from prior. 4. Mild bilateral interstitial thickening is slightly improved from prior. Electronically Signed   By: Yvonne Kendall M.D.   On: 06/19/2022 08:31   DG Chest Port 1 View  Result Date: 06/18/2022 CLINICAL DATA:  Status post CABG EXAM: PORTABLE CHEST 1 VIEW COMPARISON:  CXR 06/16/22, CT chest 06/17/22 FINDINGS: Status post drainage of a large pericardial effusion. Endotracheal terminates 5 cm above the carina. Right-sided thoracostomy tube and likely a pericardial drain in place. Compared to prior exam cardiac contours slightly decreased in size. Linear opacity in the right lung apex favored to represent scar. No pneumothorax. No focal airspace opacity. Displaced rib fractures. Visualized upper abdomen is unremarkable. IMPRESSION: 1. Endotracheal tube terminates 5 cm above the carina. 2. Right sided thoracostomy tube in place.  no pneumothorax. Electronically Signed   By: Marin Roberts M.D.   On: 06/18/2022 10:05    Cardiac Studies  ECHO 06/17/2022   1. Compared with the echo 02/2022, pericardial effusion is larger.   2. Left ventricular ejection fraction, by estimation, is 35 to 40%. The  left ventricle has mild to moderately decreased function. The left  ventricle demonstrates regional wall motion abnormalities (see scoring  diagram/findings for description). There is   moderate concentric left ventricular hypertrophy. Left ventricular  diastolic parameters are consistent with Grade I diastolic dysfunction  (impaired relaxation).   3. Right ventricular systolic function is normal. The right ventricular  size is normal. There is mildly elevated pulmonary artery systolic  pressure.   4. Left atrial size was severely dilated.   5.  Large pericardial effusion measuring up to 1.8 cm. Large pericardial  effusion. The pericardial effusion is circumferential. Findings are  consistent with cardiac tamponade. Large pleural effusion in the left  lateral region.   6. The mitral valve is normal in structure. Mild mitral valve  regurgitation. No evidence of mitral stenosis.   7. The aortic valve is tricuspid. There is mild calcification of the  aortic valve. There is mild thickening of the aortic valve. Aortic valve  regurgitation is not visualized. No aortic stenosis is present.   8. The inferior vena cava is dilated in size with <50% respiratory  variability, suggesting right atrial pressure of 15 mmHg.    Patient Profile     61 y.o. female with a hx of DM, HTN, HLD, CVA, tob use, CHF, CKD III, depression, RUL small cell lung Ca s/p XRT July 2023, COPD, hypoxia on home O2, now postop day 2 after pericardial window for large pericardial effusion with echo evidence of tamponade.  Assessment & Plan    Continues to drain a fair amount of fluid (nephrotic sd may be contributing to excessive fluid extravasation). Hopefully chest tube out in 24-48 h.  Path report  A.   PERICARDIAL, BIOPSY:  -    Pericardial fibrosis.  -    No malignancy identified.  -    No significant inflammation, granulomas or fibrinous exudate  identified.   Cytology report  Specimen Submitted:  A. PERICARDIUM, PERICARDIOCENTESIS:   FINAL MICROSCOPIC DIAGNOSIS:  - Atypical cells present   Path specimen  being more reliable, hopefully the atypical cells are just changes related to the previous XRT. Defer to Oncology.  For questions or updates, please contact [place] Please consult [email or web address] for contact info under        Signed, Sanda Klein, MD  06/20/2022, 9:07 AM

## 2022-06-20 NOTE — Progress Notes (Signed)
Nephrology Follow-Up Consult note   Assessment/Recommendations: Amber Cross is a/an 61 y.o. female with a past medical history significant for HFrEF, HTN, DM2, small cell lung cancer, chronic hypoxic respiratory failure, and CKD IV who presents with acute on chronic hypoxic respiratory failure   AKI on CKD 4: Likely secondary to hemodynamic mediated process possibly some degree of cardiorenal syndrome and possibly some ATN.  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  -Avoid nephrotoxic medications including NSAIDs -Use synthetic opioids (Fentanyl/Dilaudid) if needed -Currently no indication for HD.  Discussed with her outpatient nephrologist who states he has significant hesitancy regarding pursuing dialysis in the setting of her cancer.  Will continue to monitor closely and consider involvement of palliative care   Acute on chronic hypoxic respiratory failure: Continue with volume optimization as desired based on cardiac status.  Supplemental oxygen as needed. Overall improved   Pericardial effusion: Status post pericardial window on 06/18/2022.  Also with chest tube. Cardiothoracic surgery following.   Hypertension: Continue current medications and titrate as needed   HFrEF: Feel she [place] euvolemic. Can stop IVFs for now   Metabolic acidosis: likely 2/2 CKD. Start Na bicarb 1300mg  BID   Anemia: Hgb 8.4. Likely multifactorial cause. Iron sat 29. Avoiding ESA given malignancy. Transfusion as needed   Recommendations conveyed to primary service.    [facility] 06/20/2022 8:20 AM  ___________________________________________________________  CC: SOB  Interval History/Subjective: Patient  states she feels really tired but denies any other complaints today.  Denies nausea or vomiting.  Feels like appetite is okay   Medications:  Current Facility-Administered Medications  Medication Dose Route Frequency Provider Last Rate Last Admin   acetaminophen (TYLENOL) tablet 1,000 mg  1,000 mg Oral Q6H Stehler, Bailey C, PA-C   1,000 mg at 06/19/22 1837   Or   acetaminophen (TYLENOL) 160 MG/5ML solution 1,000 mg  1,000 mg Oral Q6H Stehler, Bailey C, PA-C       albumin human 25 % solution 12.5 g  12.5 g Intravenous Q6H Dahlia Byes, MD   Stopped at 06/20/22 0656   atorvastatin (LIPITOR) tablet 80 mg  80 mg Oral Daily Barrett, Erin R, PA-C   80 mg at 06/19/22 0947   bisacodyl (DULCOLAX) EC tablet 10 mg  10 mg Oral Daily Wynelle Beckmann C, PA-C   10 mg at 06/19/22 0947   carvedilol (COREG) tablet 12.5 mg  12.5 mg Oral BID WC Reesa Chew, MD       Chlorhexidine Gluconate Cloth 2 % PADS 6 each  6 each Topical Daily Dahlia Byes, MD   6 each at 06/19/22 1000   gabapentin (NEURONTIN) capsule 100 mg  100 mg Oral QHS Stehler, Bailey C, PA-C   100 mg at 06/18/22 2123   heparin injection 5,000 Units  5,000 Units Subcutaneous Q8H Gold, Wayne E, PA-C   5,000 Units at 06/20/22 0540   hydrALAZINE (APRESOLINE) tablet 25 mg  25 mg Oral Q12H Reesa Chew, MD       insulin aspart (novoLOG) injection 0-9 Units  0-9 Units Subcutaneous TID WC & HS Dahlia Byes, MD       isosorbide mononitrate (IMDUR) 24 hr tablet 15 mg  15 mg Oral Daily Reesa Chew, MD  metoCLOPramide (REGLAN) injection 10 mg  10 mg Intravenous Q6H Dahlia Byes, MD   10 mg at 06/20/22 0540   nitroGLYCERIN (NITROSTAT) SL tablet 0.4 mg  0.4 mg Sublingual Q5 min PRN Stehler, Pricilla Larsson, PA-C       ondansetron [facility]) injection 4 mg  4 mg Intravenous Q6H PRN Dahlia Byes, MD   4 mg at 06/19/22 0945   Oral care mouth rinse  15 mL Mouth Rinse PRN Dahlia Byes, MD       oxyCODONE (Oxy IR/ROXICODONE) immediate  release tablet 5-10 mg  5-10 mg Oral Q4H PRN Wynelle Beckmann C, PA-C   5 mg at 06/19/22 1609   pantoprazole (PROTONIX) EC tablet 40 mg  40 mg Oral Daily Magdalene River, PA-C   40 mg at 06/19/22 0947   promethazine (PHENERGAN) 6.25 mg in sodium chloride 0.9 % 50 mL IVPB  6.25 mg Intravenous Q6H PRN Dahlia Byes, MD       senna-docusate (Senokot-S) tablet 1 tablet  1 tablet Oral QHS Stehler, Bailey C, PA-C       traZODone (DESYREL) tablet 50 mg  50 mg Oral QHS PRN Wynelle Beckmann C, PA-C   50 mg at 06/18/22 2154      Review of Systems: 10 systems reviewed and negative except per interval history/subjective  Physical Exam: Vitals:   06/20/22 0600 06/20/22 0700  BP: (!) 119/59   Pulse: 73 72  Resp: 14 15  Temp:    SpO2: 99% 100%   No intake/output data recorded.  Intake/Output Summary (Last 24 hours) at 06/20/2022 0820 Last data filed at 06/20/2022 0700 Gross per 24 hour  Intake 1655.89 ml  Output 490 ml  Net 1165.89 ml   Constitutional: well-appearing, no acute distress ENMT: ears and nose without scars or lesions, MMM CV: normal rate, no edema Respiratory: Bilateral chest rise, normal work of breathing Gastrointestinal: soft, non-tender, no palpable masses or hernias Skin: no visible lesions or rashes Psych: Awake and alert but seems slightly confused   Test Results I personally reviewed new and old clinical labs and radiology tests Lab Results  Component Value Date   NA 138 06/20/2022   K 3.8 06/20/2022   CL 106 06/20/2022   CO2 16 (L) 06/20/2022   BUN 59 (H) 06/20/2022   CREATININE 5.64 (H) 06/20/2022   CALCIUM 7.7 (L) 06/20/2022   ALBUMIN 2.6 (L) 06/20/2022   PHOS 7.8 (H) 06/19/2022    CBC Recent Labs  Lab 06/16/22 0955 06/16/22 1012 06/18/22 0617 06/18/22 0855 06/19/22 0355 06/19/22 0408 06/20/22 0320  WBC 11.1*   < > 7.0  --  14.3*  --  10.6*  NEUTROABS 9.3*  --  5.4  --   --   --   --   HGB 10.0*   < > 10.0*   < > 9.1* 9.2* 8.4*  HCT 31.8*    < > 29.5*   < > 29.1* 27.0* 26.4*  MCV 86.9   < > 85.0  --  89.3  --  88.3  PLT 440*   < > 266  --  270  --  217   < > = values in this interval not displayed.

## 2022-06-21 ENCOUNTER — Inpatient Hospital Stay (HOSPITAL_COMMUNITY): Payer: Commercial Managed Care - HMO

## 2022-06-21 DIAGNOSIS — I3139 Other pericardial effusion (noninflammatory): Secondary | ICD-10-CM | POA: Diagnosis not present

## 2022-06-21 DIAGNOSIS — I161 Hypertensive emergency: Secondary | ICD-10-CM | POA: Diagnosis not present

## 2022-06-21 LAB — CBC
HCT: 26.3 % — ABNORMAL LOW (ref 36.0–46.0)
Hemoglobin: 8.8 g/dL — ABNORMAL LOW (ref 12.0–15.0)
MCH: 29 pg (ref 26.0–34.0)
MCHC: 33.5 g/dL (ref 30.0–36.0)
MCV: 86.8 fL (ref 80.0–100.0)
Platelets: 207 10*3/uL (ref 150–400)
RBC: 3.03 MIL/uL — ABNORMAL LOW (ref 3.87–5.11)
RDW: 16.7 % — ABNORMAL HIGH (ref 11.5–15.5)
WBC: 13.6 10*3/uL — ABNORMAL HIGH (ref 4.0–10.5)
nRBC: 0 % (ref 0.0–0.2)

## 2022-06-21 LAB — COMPREHENSIVE METABOLIC PANEL
ALT: <5 U/L (ref 0–44)
AST: 15 U/L (ref 15–41)
Albumin: 2.3 g/dL — ABNORMAL LOW (ref 3.5–5.0)
Alkaline Phosphatase: 110 U/L (ref 38–126)
Anion gap: 16 — ABNORMAL HIGH (ref 5–15)
BUN: 62 mg/dL — ABNORMAL HIGH (ref 6–20)
CO2: 16 mmol/L — ABNORMAL LOW (ref 22–32)
Calcium: 7.6 mg/dL — ABNORMAL LOW (ref 8.9–10.3)
Chloride: 107 mmol/L (ref 98–111)
Creatinine, Ser: 6.06 mg/dL — ABNORMAL HIGH (ref 0.44–1.00)
GFR, Estimated: 7 mL/min — ABNORMAL LOW (ref >60)
Glucose, Bld: 96 mg/dL (ref 70–99)
Potassium: 4.2 mmol/L (ref 3.5–5.1)
Sodium: 139 mmol/L (ref 135–145)
Total Bilirubin: 0.4 mg/dL (ref 0.3–1.2)
Total Protein: 4.5 g/dL — ABNORMAL LOW (ref 6.5–8.1)

## 2022-06-21 LAB — AEROBIC CULTURE W GRAM STAIN (SUPERFICIAL SPECIMEN)
Culture: NO GROWTH
Gram Stain: NONE SEEN

## 2022-06-21 LAB — GLUCOSE, CAPILLARY
Glucose-Capillary: 104 mg/dL — ABNORMAL HIGH (ref 70–99)
Glucose-Capillary: 89 mg/dL (ref 70–99)
Glucose-Capillary: 149 mg/dL — ABNORMAL HIGH (ref 70–99)
Glucose-Capillary: 134 mg/dL — ABNORMAL HIGH (ref 70–99)
Glucose-Capillary: 161 mg/dL — ABNORMAL HIGH (ref 70–99)

## 2022-06-21 LAB — TYPE AND SCREEN
ABO/RH(D): O POS
Antibody Screen: NEGATIVE
Unit division: 0
Unit division: 0
Unit division: 0

## 2022-06-21 LAB — BPAM RBC
Blood Product Expiration Date: 202311272359
Blood Product Expiration Date: 202312152359
Blood Product Expiration Date: 202312152359
ISSUE DATE / TIME: 202311202254
ISSUE DATE / TIME: 202311210718
ISSUE DATE / TIME: 202311210718
Unit Type and Rh: 5100
Unit Type and Rh: 5100
Unit Type and Rh: 5100

## 2022-06-21 LAB — RENAL FUNCTION PANEL
Albumin: 2.3 g/dL — ABNORMAL LOW (ref 3.5–5.0)
Anion gap: 16 — ABNORMAL HIGH (ref 5–15)
BUN: 66 mg/dL — ABNORMAL HIGH (ref 6–20)
CO2: 15 mmol/L — ABNORMAL LOW (ref 22–32)
Calcium: 7.4 mg/dL — ABNORMAL LOW (ref 8.9–10.3)
Chloride: 106 mmol/L (ref 98–111)
Creatinine, Ser: 6.07 mg/dL — ABNORMAL HIGH (ref 0.44–1.00)
GFR, Estimated: 7 mL/min — ABNORMAL LOW (ref >60)
Glucose, Bld: 151 mg/dL — ABNORMAL HIGH (ref 70–99)
Phosphorus: 7.4 mg/dL — ABNORMAL HIGH (ref 2.5–4.6)
Potassium: 4.3 mmol/L (ref 3.5–5.1)
Sodium: 137 mmol/L (ref 135–145)

## 2022-06-21 LAB — ECHOCARDIOGRAM LIMITED
Area-P 1/2: 4.21 cm2
Calc EF: 41 %
Height: 67 in
S' Lateral: 3.56 cm
Single Plane A2C EF: 35 %
Single Plane A4C EF: 45.3 %
Weight: 2321 oz

## 2022-06-21 MED ORDER — METOCLOPRAMIDE HCL 5 MG/ML IJ SOLN
10.0000 mg | Freq: Two times a day (BID) | INTRAMUSCULAR | Status: DC
  Administered 2022-06-21 – 2022-07-02 (×20): 10 mg via INTRAVENOUS
  Filled 2022-06-21 (×22): qty 2

## 2022-06-21 MED ORDER — LIDOCAINE HCL 1 % IJ SOLN: 10.0000 mL | Freq: Once | INTRAMUSCULAR | Status: AC

## 2022-06-21 MED ORDER — MORPHINE SULFATE (PF) 4 MG/ML IV SOLN
INTRAVENOUS | Status: AC
  Filled 2022-06-21: qty 1

## 2022-06-21 MED ORDER — MORPHINE SULFATE (PF) 4 MG/ML IV SOLN
4.0000 mg | Freq: Once | INTRAVENOUS | Status: AC
  Administered 2022-06-21: 4 mg via INTRAVENOUS

## 2022-06-21 MED ORDER — LACTATED RINGERS IV SOLN: INTRAVENOUS | Status: AC

## 2022-06-21 MED ORDER — LIDOCAINE HCL (PF) 1 % IJ SOLN
INTRAMUSCULAR | Status: AC
  Filled 2022-06-21: qty 30

## 2022-06-21 MED ORDER — LIDOCAINE HCL (PF) 1 % IJ SOLN
INTRAMUSCULAR | Status: AC
  Administered 2022-06-21: 10 mL via INTRADERMAL
  Filled 2022-06-21: qty 5

## 2022-06-21 NOTE — Progress Notes (Signed)
  Echocardiogram 2D Echocardiogram has been performed.  Darlina Sicilian M 06/21/2022, 10:08 AM

## 2022-06-21 NOTE — Progress Notes (Signed)
Rounding Note    Patient Name: Amber Cross Date of Encounter: 06/21/2022  [facility] Cardiologist: Berniece Salines, DO   Subjective   Appears well.  No complaints. Chest tubes removed. Creatinine has increased further, now 6.06.  Electrolytes are okay.  I am not certain that the urine output has been accurately recorded.  Inpatient Medications    Scheduled Meds:  acetaminophen  1,000 mg Oral Q6H   Or   acetaminophen (TYLENOL) oral liquid 160 mg/5 mL  1,000 mg Oral Q6H   atorvastatin  80 mg Oral Daily   bisacodyl  10 mg Oral Daily   carvedilol  12.5 mg Oral BID WC   Chlorhexidine Gluconate Cloth  6 each Topical Daily   gabapentin  100 mg Oral QHS   heparin injection (subcutaneous)  5,000 Units Subcutaneous Q8H   hydrALAZINE  25 mg Oral Q12H   insulin aspart  0-9 Units Subcutaneous TID WC & HS   isosorbide mononitrate  15 mg Oral Daily   metoCLOPramide (REGLAN) injection  10 mg Intravenous Q6H   pantoprazole  40 mg Oral Daily   senna-docusate  1 tablet Oral QHS   Continuous Infusions:  lactated ringers 100 mL/hr at 06/21/22 0821   promethazine (PHENERGAN) injection (IM or IVPB)     PRN Meds: nitroGLYCERIN, ondansetron (ZOFRAN) IV, mouth rinse, oxyCODONE, promethazine (PHENERGAN) injection (IM or IVPB), traZODone   Vital Signs    Vitals:   06/21/22 0600 06/21/22 0617 06/21/22 0700 06/21/22 0738  BP: 131/66 127/65 128/63   Pulse: 72  73   Resp: 13  13   Temp:    98 F (36.7 C)  TempSrc:    Oral  SpO2: 98%  98%   Weight:      Height:        Intake/Output Summary (Last 24 hours) at 06/21/2022 0849 Last data filed at 06/21/2022 9767 Gross per 24 hour  Intake 120 ml  Output 150 ml  Net -30 ml      06/21/2022    5:00 AM 06/20/2022    5:00 AM 06/19/2022    4:10 AM  Last 3 Weights  Weight (lbs) 145 lb 1 oz 145 lb 8.1 oz 145 lb 11.6 oz  Weight (kg) 65.8 kg 66 kg 66.1 kg      Telemetry    Normal sinus rhythm- Personally Reviewed  ECG     No new tracing- Personally Reviewed  Physical Exam  Appears well GEN: No acute distress.   Neck: No JVD Cardiac: RRR, no murmurs, rubs, or gallops.  Respiratory: Clear to auscultation bilaterally. GI: Soft, nontender, non-distended  MS: No edema; No deformity. Neuro:  Nonfocal  Psych: Normal affect   Labs    High Sensitivity Troponin:   Recent Labs  Lab 06/16/22 0955 06/16/22 1240  TROPONINIHS 44* 51*     Chemistry Recent Labs  Lab 06/16/22 0955 06/16/22 1012 06/20/22 0320 06/20/22 1549 06/21/22 0347  NA 142   < > 138 137 139  K 3.8   < > 3.8 4.2 4.2  CL 116*   < > 106 108 107  CO2 18*   < > 16* 15* 16*  GLUCOSE 211*   < > 87 66* 96  BUN 55*   < > 59* 61* 62*  CREATININE 5.46*   < > 5.64* 5.62* 6.06*  CALCIUM 8.0*   < > 7.7* 7.2* 7.6*  PROT 6.6  --  4.6*  --  4.5*  ALBUMIN 2.4*   < >  2.6* 3.0* 2.3*  AST 19  --  7*  --  15  ALT 14  --  <5  --  <5  ALKPHOS 194*  --  73  --  110  BILITOT 0.4  --  0.1*  --  0.4  GFRNONAA 8*   < > 8* 8* 7*  ANIONGAP 8   < > 16* 14 16*   < > = values in this interval not displayed.    Lipids No results for input(s): "CHOL", "TRIG", "HDL", "LABVLDL", "LDLCALC", "CHOLHDL" in the last 168 hours.  Hematology Recent Labs  Lab 06/19/22 0355 06/19/22 0408 06/20/22 0320 06/21/22 0347  WBC 14.3*  --  10.6* 13.6*  RBC 3.26*  --  2.99* 3.03*  HGB 9.1* 9.2* 8.4* 8.8*  HCT 29.1* 27.0* 26.4* 26.3*  MCV 89.3  --  88.3 86.8  MCH 27.9  --  28.1 29.0  MCHC 31.3  --  31.8 33.5  RDW 16.6*  --  16.8* 16.7*  PLT 270  --  217 207   Thyroid No results for input(s): "TSH", "FREET4" in the last 168 hours.  BNP Recent Labs  Lab 06/16/22 0955  BNP 3,614.4*    DDimer No results for input(s): "DDIMER" in the last 168 hours.   Radiology    DG CHEST PORT 1 VIEW  Result Date: 06/21/2022 CLINICAL DATA:  Pleural effusion, shortness of breath. EXAM: PORTABLE CHEST 1 VIEW COMPARISON:  Chest radiograph dated 06/20/2022. FINDINGS: The heart is  enlarged. There is a small left pleural effusion with associated atelectasis/airspace disease. There is mild right basilar atelectasis/airspace disease. There is no definite right pleural effusion. There is no pneumothorax. Degenerative changes are seen in the spine. IMPRESSION: 1. Small left pleural effusion with associated atelectasis/airspace disease. 2. Mild right basilar atelectasis/airspace disease. 3. Cardiomegaly. Electronically Signed   By: Zerita Boers M.D.   On: 06/21/2022 08:37   DG Chest Port 1 View  Result Date: 06/20/2022 CLINICAL DATA:  Chest tube in place. History of pericardial window procedure and chest tube placement. EXAM: PORTABLE CHEST 1 VIEW COMPARISON:  06/19/2022 FINDINGS: Pericardial drain and right chest tube are in place. No large pneumothorax but questionable small pleural line at the right lung apex. Cardiac silhouette is stable in size. Persistent densities at the left lung base and retrocardiac space are suggestive for atelectasis and left pleural fluid. Slightly decreased atelectasis in the left lung. IMPRESSION: 1. Right chest tube and pericardial drain remain in place. Difficult to exclude tiny right apical pneumothorax as described. 2. Persistent densities at the left lung base and retrocardiac space. Electronically Signed   By: Markus Daft M.D.   On: 06/20/2022 08:23    Cardiac Studies   ECHO 06/17/2022     1. Compared with the echo 02/2022, pericardial effusion is larger.   2. Left ventricular ejection fraction, by estimation, is 35 to 40%. The  left ventricle has mild to moderately decreased function. The left  ventricle demonstrates regional wall motion abnormalities (see scoring  diagram/findings for description). There is   moderate concentric left ventricular hypertrophy. Left ventricular  diastolic parameters are consistent with Grade I diastolic dysfunction  (impaired relaxation).   3. Right ventricular systolic function is normal. The right  ventricular  size is normal. There is mildly elevated pulmonary artery systolic  pressure.   4. Left atrial size was severely dilated.   5. Large pericardial effusion measuring up to 1.8 cm. Large pericardial  effusion. The pericardial effusion is circumferential.  Findings are  consistent with cardiac tamponade. Large pleural effusion in the left  lateral region.   6. The mitral valve is normal in structure. Mild mitral valve  regurgitation. No evidence of mitral stenosis.   7. The aortic valve is tricuspid. There is mild calcification of the  aortic valve. There is mild thickening of the aortic valve. Aortic valve  regurgitation is not visualized. No aortic stenosis is present.   8. The inferior vena cava is dilated in size with <50% respiratory  variability, suggesting right atrial pressure of 15 mmHg.   Patient Profile     61 y.o. female with a hx of DM, HTN, HLD, CVA, tob use, CHF, CKD III, nephrotic syndrome, depression, RUL small cell lung Ca s/p XRT July 2023, COPD, hypoxia on home O2, now postop day 2 after pericardial window for large pericardial effusion with echo evidence of tamponade.  Assessment & Plan    Hemodynamically very well compensated. Chest tubes have been removed. Limited echo today for any residual pericardial effusion. Biggest problem at this point is persistent and slowly worsening advanced renal insufficiency. Hopefully this is due to transient hemodynamically caused acute kidney injury.  Unfortunately, baseline kidney function was already severely reduced.     For questions or updates, please contact [place] Please consult [email or web address] for contact info under        Signed, Sanda Klein, MD  06/21/2022, 8:49 AM

## 2022-06-21 NOTE — Progress Notes (Signed)
Patient transferred from laying position to dangling at bedside. Patient prepared for a walk with four wheel walker. Upon standing, a large amount of serous fluid gushed from prior pleural chest tube site. Patient's gown was drenched and puddle was on the floor. Patient was returned to bed and bathed. Dressing site was cleansed and dressing was change. Attempted to walk patient again and the same thing happened with another large amount of drainage. Patient returned to the bed and Dr. Darcey Nora paged. Promptly received a call back and plan to prepare patient for additional sutures to prior chest tube site.   One time dose of Morphine was given prior to procedure. Patient draped in a sterile fashion and four additional sutures placed by physician. Patient is no longer having additional drainage from site. Area cleansed and dressed. Patient and vital signs stable at this time. Will attempt to walk patient again after lunch.

## 2022-06-21 NOTE — Progress Notes (Signed)
Nephrology Follow-Up Consult note   Assessment/Recommendations: Amber Cross is a/an 61 y.o. female with a past medical history significant for HFrEF, HTN, DM2, small cell lung cancer, chronic hypoxic respiratory failure, and CKD IV who presents with acute on chronic hypoxic respiratory failure   AKI on CKD 4: Likely secondary to hemodynamic mediated process possibly some degree of cardiorenal syndrome and possibly some ATN.  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  Lactated Ringer's at 100 cc/h for 10 hours -Continue to monitor urine output closely -Serum albumin improved -Continue to monitor daily Cr, Dose meds for GFR -Monitor Daily I/Os, Daily weight  -Maintain MAP>65 for optimal renal perfusion.  -Avoid nephrotoxic medications including NSAIDs -Use synthetic opioids (Fentanyl/Dilaudid) if needed -Currently no indication for HD.  Discussed with her outpatient nephrologist who states he has significant hesitancy regarding pursuing dialysis in the setting of her cancer.  Will continue to monitor closely and consider involvement of palliative care   Acute on chronic hypoxic respiratory failure: Continue with volume optimization as desired based on cardiac status.  Supplemental oxygen as needed. Overall improved   Pericardial effusion: Status post pericardial window on 06/18/2022.  Cytology negative for malignancy. Cardiothoracic surgery following.   Hypertension: Continue current medications and titrate as needed   HFrEF: Feel she [place] euvolemic. Can stop IVFs for now   Metabolic acidosis: likely 2/2 CKD.  Continue oral sodium bicarbonate   Anemia: Likely multifactorial cause. Iron sat 29. Avoiding ESA given malignancy. Transfusion as needed   Recommendations conveyed to primary service.    [facility] 06/21/2022 7:58 AM  ___________________________________________________________  CC:  SOB  Interval History/Subjective: Poor urine output over the past 24 hours.  Creatinine slightly higher.  Patient admits to not drinking very much fluid.   Medications:  Current Facility-Administered Medications  Medication Dose Route Frequency Provider Last Rate Last Admin   acetaminophen (TYLENOL) tablet 1,000 mg  1,000 mg Oral Q6H Stehler, Bailey C, PA-C   1,000 mg at 06/21/22 0617   Or   acetaminophen (TYLENOL) 160 MG/5ML solution 1,000 mg  1,000 mg Oral Q6H Stehler, Bailey C, PA-C       atorvastatin (LIPITOR) tablet 80 mg  80 mg Oral Daily Barrett, Erin R, PA-C   80 mg at 06/20/22 0916   bisacodyl (DULCOLAX) EC tablet 10 mg  10 mg Oral Daily Wynelle Beckmann C, PA-C   10 mg at 06/19/22 0947   carvedilol (COREG) tablet 12.5 mg  12.5 mg Oral BID WC Reesa Chew, MD   12.5 mg at 06/21/22 0617   Chlorhexidine Gluconate Cloth 2 % PADS 6 each  6 each Topical Daily Dahlia Byes, MD   6 each at 06/20/22 0917   gabapentin (NEURONTIN) capsule 100 mg  100 mg Oral QHS Stehler, Bailey C, PA-C   100 mg at 06/20/22 2111   heparin injection 5,000 Units  5,000 Units Subcutaneous Q8H Gold, Wayne E, PA-C   5,000 Units at 06/21/22 0617   hydrALAZINE (APRESOLINE) tablet 25 mg  25 mg Oral Q12H Reesa Chew, MD   25 mg at 06/21/22 0617   insulin aspart (novoLOG) injection 0-9 Units  0-9 Units Subcutaneous TID WC & HS Dahlia Byes, MD   1 Units at 06/20/22 2155   isosorbide mononitrate (IMDUR) 24 hr tablet 15 mg  15 mg Oral Daily Reesa Chew, MD   15 mg at 06/20/22 0916   lactated ringers infusion   Intravenous  Continuous Reesa Chew, MD       metoCLOPramide (REGLAN) injection 10 mg  10 mg Intravenous Q6H Dahlia Byes, MD   10 mg at 06/21/22 0617   nitroGLYCERIN (NITROSTAT) SL tablet 0.4 mg  0.4 mg Sublingual Q5 min PRN Magdalene River, PA-C       ondansetron [facility]) injection 4 mg  4 mg Intravenous Q6H PRN Dahlia Byes, MD   4 mg at 06/19/22 0945   Oral care mouth rinse  15  mL Mouth Rinse PRN Dahlia Byes, MD       oxyCODONE (Oxy IR/ROXICODONE) immediate release tablet 5-10 mg  5-10 mg Oral Q4H PRN Wynelle Beckmann C, PA-C   5 mg at 06/20/22 1641   pantoprazole (PROTONIX) EC tablet 40 mg  40 mg Oral Daily Stehler, Pricilla Larsson, PA-C   40 mg at 06/20/22 0916   promethazine (PHENERGAN) 6.25 mg in sodium chloride 0.9 % 50 mL IVPB  6.25 mg Intravenous Q6H PRN Dahlia Byes, MD       senna-docusate (Senokot-S) tablet 1 tablet  1 tablet Oral QHS Magdalene River, PA-C   1 tablet at 06/20/22 2111   traZODone (DESYREL) tablet 50 mg  50 mg Oral QHS PRN Magdalene River, PA-C   50 mg at 06/18/22 2154      Review of Systems: 10 systems reviewed and negative except per interval history/subjective  Physical Exam: Vitals:   06/21/22 0700 06/21/22 0738  BP: 128/63   Pulse: 73   Resp: 13   Temp:  98 F (36.7 C)  SpO2: 98%    No intake/output data recorded.  Intake/Output Summary (Last 24 hours) at 06/21/2022 0758 Last data filed at 06/21/2022 1027 Gross per 24 hour  Intake 153.78 ml  Output 245 ml  Net -91.22 ml   Constitutional: well-appearing, no acute distress ENMT: ears and nose without scars or lesions, MMM CV: normal rate, no edema Respiratory: Bilateral chest rise, normal work of breathing Gastrointestinal: soft, non-tender, no palpable masses or hernias Skin: no visible lesions or rashes Psych: Awake and alert but seems slightly confused   Test Results I personally reviewed new and old clinical labs and radiology tests Lab Results  Component Value Date   NA 139 06/21/2022   K 4.2 06/21/2022   CL 107 06/21/2022   CO2 16 (L) 06/21/2022   BUN 62 (H) 06/21/2022   CREATININE 6.06 (H) 06/21/2022   CALCIUM 7.6 (L) 06/21/2022   ALBUMIN 2.3 (L) 06/21/2022   PHOS 7.7 (H) 06/20/2022    CBC Recent Labs  Lab 06/16/22 0955 06/16/22 1012 06/18/22 0617 06/18/22 0855 06/19/22 0355 06/19/22 0408 06/20/22 0320 06/21/22 0347  WBC 11.1*   < > 7.0   --  14.3*  --  10.6* 13.6*  NEUTROABS 9.3*  --  5.4  --   --   --   --   --   HGB 10.0*   < > 10.0*   < > 9.1* 9.2* 8.4* 8.8*  HCT 31.8*   < > 29.5*   < > 29.1* 27.0* 26.4* 26.3*  MCV 86.9   < > 85.0  --  89.3  --  88.3 86.8  PLT 440*   < > 266  --  270  --  217 207   < > = values in this interval not displayed.

## 2022-06-21 NOTE — Progress Notes (Addendum)
[facility] DAILY ICU PROGRESS NOTE                   [place]            [place] [id number]          [phone]   3 Days Post-Op Procedure(s) (LRB): SUBXYPHOID PERICARDIAL WINDOW (N/A) TRANSESOPHAGEAL ECHOCARDIOGRAM (TEE) (N/A) CHEST TUBE INSERTION (Right)  Total Length of Stay:  LOS: 5 days   Subjective: Feels ok , no specific c/o  Objective: Vital signs in last 24 hours: Temp:  [97.9 F (36.6 C)-98.1 F (36.7 C)] 98.1 F (36.7 C) (11/24 0400) Pulse Rate:  [68-73] 72 (11/24 0600) Cardiac Rhythm: Normal sinus rhythm (11/23 2000) Resp:  [13-18] 13 (11/24 0600) BP: (106-133)/(49-68) 127/65 (11/24 0617) SpO2:  [97 %-100 %] 98 % (11/24 0600) Weight:  [65.8 kg] 65.8 kg (11/24 0500)  Filed Weights   06/19/22 0410 06/20/22 0500 06/21/22 0500  Weight: 66.1 kg 66 kg 65.8 kg    Weight change: -0.2 kg   Hemodynamic parameters for last 24 hours:    Intake/Output from previous day: 11/23 0701 - 11/24 0700 In: 153.8 [P.O.:120; I.V.:33.8] Out: 245 [Urine:195; Chest Tube:50]  Intake/Output this shift: No intake/output data recorded.  Current Meds: Scheduled Meds:  acetaminophen  1,000 mg Oral Q6H   Or   acetaminophen (TYLENOL) oral liquid 160 mg/5 mL  1,000 mg Oral Q6H   atorvastatin  80 mg Oral Daily   bisacodyl  10 mg Oral Daily   carvedilol  12.5 mg Oral BID WC   Chlorhexidine Gluconate Cloth  6 each Topical Daily   gabapentin  100 mg Oral QHS   heparin injection (subcutaneous)  5,000 Units Subcutaneous Q8H   hydrALAZINE  25 mg Oral Q12H   insulin aspart  0-9 Units Subcutaneous TID WC & HS   isosorbide mononitrate  15 mg Oral Daily   metoCLOPramide (REGLAN) injection  10 mg Intravenous Q6H   pantoprazole  40 mg Oral Daily   senna-docusate  1 tablet Oral QHS   Continuous Infusions:  promethazine (PHENERGAN) injection (IM or IVPB)     PRN Meds:.nitroGLYCERIN, ondansetron (ZOFRAN) IV, mouth rinse, oxyCODONE, promethazine (PHENERGAN) injection (IM  or IVPB), traZODone  General appearance: alert, cooperative, fatigued, and no distress Heart: regular rate and rhythm and no rub Lungs: dim in left base Abdomen: soft, mild distension Extremities: + peripheral edema in extrems Wound: incis healing well  Lab Results: CBC: Recent Labs    06/20/22 0320 06/21/22 0347  WBC 10.6* 13.6*  HGB 8.4* 8.8*  HCT 26.4* 26.3*  PLT 217 207   BMET:  Recent Labs    06/20/22 1549 06/21/22 0347  NA 137 139  K 4.2 4.2  CL 108 107  CO2 15* 16*  GLUCOSE 66* 96  BUN 61* 62*  CREATININE 5.62* 6.06*  CALCIUM 7.2* 7.6*    CMET: Lab Results  Component Value Date   WBC 13.6 (H) 06/21/2022   HGB 8.8 (L) 06/21/2022   HCT 26.3 (L) 06/21/2022   PLT 207 06/21/2022   GLUCOSE 96 06/21/2022   CHOL 226 (H) 01/28/2022   TRIG 194 (H) 01/28/2022   HDL 44 01/28/2022   LDLCALC 143 (H) 01/28/2022   ALT <5 06/21/2022   AST 15 06/21/2022   NA 139 06/21/2022   K 4.2 06/21/2022   CL 107 06/21/2022   CREATININE 6.06 (H) 06/21/2022   BUN 62 (H) 06/21/2022   CO2 16 (L) 06/21/2022  TSH 1.694 01/29/2022   INR 1.1 06/17/2022   HGBA1C 9.7 (H) 01/28/2022      PT/INR: No results for input(s): "LABPROT", "INR" in the last 72 hours. Radiology: No results found.   Assessment/Plan: S/P Procedure(s) (LRB): SUBXYPHOID PERICARDIAL WINDOW (N/A) TRANSESOPHAGEAL ECHOCARDIOGRAM (TEE) (N/A) CHEST TUBE INSERTION (Right) POD#3  1 afeb, VSS sinus rhythm 2 sats good on 2 liters 3 tubes are out 4 poor UOP, BUN/Creat worsening trend- 62/6.06- nephrology has been consulted 5 increased leukocytosis 6 H/H stable, chronic anemia, scant blood loss at surgery 7 path- pericardial fibrosis, no malignancy, cytology- atypical cells favor reactive mesothelial cells- will need to determine medical management which is limited d/t renal issues and DM 8 CXR - some left basilar ASD 9 routine pulm hygiene and rehab    Amber Giovanni PA-C Pager [phone] 06/21/2022 7:32  AM Status post pericardial window for moderate effusion and history of small cell lung cancer right upper lobe Patient examined in today's chest x-ray reviewed Chest tubes have been removed and no evidence of recurrent right pleural effusion.  Echocardiogram today shows no residual pericardial effusion.  However patient's renal function continues to deteriorate with rising creatinine.  She should remain in the ICU until her renal function stabilizes.  Pathology of pericardial tissue shows no evidence of malignancy.  Pleural and pericardial fluid cytology showed no malignant cells.

## 2022-06-21 NOTE — Progress Notes (Signed)
     [address]       [place] [address]             [phone]       EVENING ROUNDS  Feeling well Following renal function while in unit Comfortable

## 2022-06-22 ENCOUNTER — Inpatient Hospital Stay (HOSPITAL_COMMUNITY): Payer: Commercial Managed Care - HMO

## 2022-06-22 DIAGNOSIS — I161 Hypertensive emergency: Secondary | ICD-10-CM | POA: Diagnosis not present

## 2022-06-22 LAB — CBC
HCT: 27.4 % — ABNORMAL LOW (ref 36.0–46.0)
Hemoglobin: 8.5 g/dL — ABNORMAL LOW (ref 12.0–15.0)
MCH: 27.8 pg (ref 26.0–34.0)
MCHC: 31 g/dL (ref 30.0–36.0)
MCV: 89.5 fL (ref 80.0–100.0)
Platelets: 217 10*3/uL (ref 150–400)
RBC: 3.06 MIL/uL — ABNORMAL LOW (ref 3.87–5.11)
RDW: 16.8 % — ABNORMAL HIGH (ref 11.5–15.5)
WBC: 13.3 10*3/uL — ABNORMAL HIGH (ref 4.0–10.5)
nRBC: 0 % (ref 0.0–0.2)

## 2022-06-22 LAB — COMPREHENSIVE METABOLIC PANEL
ALT: 9 U/L (ref 0–44)
AST: 37 U/L (ref 15–41)
Albumin: 2.1 g/dL — ABNORMAL LOW (ref 3.5–5.0)
Alkaline Phosphatase: 182 U/L — ABNORMAL HIGH (ref 38–126)
Anion gap: 13 (ref 5–15)
BUN: 67 mg/dL — ABNORMAL HIGH (ref 6–20)
CO2: 16 mmol/L — ABNORMAL LOW (ref 22–32)
Calcium: 7.4 mg/dL — ABNORMAL LOW (ref 8.9–10.3)
Chloride: 109 mmol/L (ref 98–111)
Creatinine, Ser: 6.07 mg/dL — ABNORMAL HIGH (ref 0.44–1.00)
GFR, Estimated: 7 mL/min — ABNORMAL LOW (ref >60)
Glucose, Bld: 118 mg/dL — ABNORMAL HIGH (ref 70–99)
Potassium: 4.3 mmol/L (ref 3.5–5.1)
Sodium: 138 mmol/L (ref 135–145)
Total Bilirubin: 0.2 mg/dL — ABNORMAL LOW (ref 0.3–1.2)
Total Protein: 4.4 g/dL — ABNORMAL LOW (ref 6.5–8.1)

## 2022-06-22 LAB — RENAL FUNCTION PANEL
Albumin: 2.9 g/dL — ABNORMAL LOW (ref 3.5–5.0)
Anion gap: 13 (ref 5–15)
BUN: 66 mg/dL — ABNORMAL HIGH (ref 6–20)
CO2: 15 mmol/L — ABNORMAL LOW (ref 22–32)
Calcium: 7.4 mg/dL — ABNORMAL LOW (ref 8.9–10.3)
Chloride: 108 mmol/L (ref 98–111)
Creatinine, Ser: 6.05 mg/dL — ABNORMAL HIGH (ref 0.44–1.00)
GFR, Estimated: 7 mL/min — ABNORMAL LOW (ref >60)
Glucose, Bld: 111 mg/dL — ABNORMAL HIGH (ref 70–99)
Phosphorus: 7.3 mg/dL — ABNORMAL HIGH (ref 2.5–4.6)
Potassium: 4.5 mmol/L (ref 3.5–5.1)
Sodium: 136 mmol/L (ref 135–145)

## 2022-06-22 LAB — GLUCOSE, CAPILLARY
Glucose-Capillary: 138 mg/dL — ABNORMAL HIGH (ref 70–99)
Glucose-Capillary: 109 mg/dL — ABNORMAL HIGH (ref 70–99)
Glucose-Capillary: 122 mg/dL — ABNORMAL HIGH (ref 70–99)
Glucose-Capillary: 151 mg/dL — ABNORMAL HIGH (ref 70–99)
Glucose-Capillary: 172 mg/dL — ABNORMAL HIGH (ref 70–99)

## 2022-06-22 MED ORDER — ALBUMIN HUMAN 25 % IV SOLN
25.0000 g | Freq: Four times a day (QID) | INTRAVENOUS | Status: AC
  Administered 2022-06-22 (×2): 25 g via INTRAVENOUS
  Filled 2022-06-22 (×2): qty 100

## 2022-06-22 MED ORDER — SEVELAMER CARBONATE 800 MG PO TABS
800.0000 mg | ORAL_TABLET | Freq: Three times a day (TID) | ORAL | Status: DC
  Administered 2022-06-23 – 2022-06-24 (×6): 800 mg via ORAL
  Filled 2022-06-22 (×7): qty 1

## 2022-06-22 NOTE — Progress Notes (Signed)
      [address]       [place] [address]             [phone]      4 Days Post-Op  Procedure(s) (LRB): SUBXYPHOID PERICARDIAL WINDOW (N/A) TRANSESOPHAGEAL ECHOCARDIOGRAM (TEE) (N/A) CHEST TUBE INSERTION (Right)  Total Length of Stay:  LOS: 6 days   SUBJECTIVE: Overall feels ok Ambulated in hall this am Eating well Vitals:   06/22/22 0618 06/22/22 0700  BP: (!) 147/70   Pulse: 70 69  Resp: 13 16  Temp:    SpO2: 99% 98%    Intake/Output      11/24 0701 11/25 0700 11/25 0701 11/26 0700   P.O. 120    I.V. (mL/kg) 1194.5 (17.7)    Total Intake(mL/kg) 1314.5 (19.4)    Urine (mL/kg/hr) 300 (0.2)    Stool     Chest Tube     Total Output 300    Net +1014.5             promethazine (PHENERGAN) injection (IM or IVPB)      CBC    Component Value Date/Time   WBC 13.3 (H) 06/22/2022 0316   RBC 3.06 (L) 06/22/2022 0316   HGB 8.5 (L) 06/22/2022 0316   HGB 6.9 (LL) 12/11/2021 1626   HCT 27.4 (L) 06/22/2022 0316   HCT 21.0 (L) 12/11/2021 1626   PLT 217 06/22/2022 0316   PLT 307 12/11/2021 1626   MCV 89.5 06/22/2022 0316   MCV 87 12/11/2021 1626   MCH 27.8 06/22/2022 0316   MCHC 31.0 06/22/2022 0316   RDW 16.8 (H) 06/22/2022 0316   RDW 13.9 12/11/2021 1626   LYMPHSABS 1.0 06/18/2022 0617   MONOABS 0.5 06/18/2022 0617   EOSABS 0.1 06/18/2022 0617   BASOSABS 0.0 06/18/2022 0617   CMP     Component Value Date/Time   NA 138 06/22/2022 0316   NA 138 02/20/2022 1207   K 4.3 06/22/2022 0316   CL 109 06/22/2022 0316   CO2 16 (L) 06/22/2022 0316   GLUCOSE 118 (H) 06/22/2022 0316   BUN 67 (H) 06/22/2022 0316   BUN 25 02/20/2022 1207   CREATININE 6.07 (H) 06/22/2022 0316   CALCIUM 7.4 (L) 06/22/2022 0316   PROT 4.4 (L) 06/22/2022 0316   ALBUMIN 2.1 (L) 06/22/2022 0316   AST 37 06/22/2022 0316   ALT 9 06/22/2022 0316   ALKPHOS 182 (H) 06/22/2022 0316   BILITOT 0.2 (L) 06/22/2022 0316   GFRNONAA 7 (L) 06/22/2022 0316   GFRAA 120  04/06/2019 1506   ABG    Component Value Date/Time   PHART 7.240 (L) 06/19/2022 0408   PCO2ART 44.6 06/19/2022 0408   PO2ART 70 (L) 06/19/2022 0408   HCO3 19.2 (L) 06/19/2022 0408   TCO2 21 (L) 06/19/2022 0408   ACIDBASEDEF 8.0 (H) 06/19/2022 0408   O2SAT 91 06/19/2022 0408   CBG (last 3)  Recent Labs    06/21/22 1110 06/21/22 1532 06/21/22 2159  GLUCAP 149* 134* 161*   EXAM Lungs: decreased at bases Card: RR Ext: warm   ASSESSMENT: Pericardial effusion: echo yesterday without effusion Bilateral pleural effusions: secondary to fluid retention.  Renal failure: Cr stable, bicarb stable. Urine output 300cc  Renal following  Continue to monitor for now  Coralie Common, MD 06/22/2022

## 2022-06-22 NOTE — Progress Notes (Signed)
Rounding Note    Patient Name: Amber Cross Date of Encounter: 06/22/2022  [place] Cardiologist: Berniece Salines, DO   Subjective   No cardiovascular complaints. 3 L positive since admission.  IV fluids have been stopped, but she is receiving oral sodium bicarb and for metabolic acidosis Has some physical findings suggesting hypervolemia (lower extremity edema, elevated jugular venous pulsations), but denies dyspnea. Echocardiogram confirms findings of elevated mean left atrial pressure and right atrial pressure.  There is no residual pericardial effusion, but there is a sizable left pleural effusion. Unchanged severe renal dysfunction (with creatinine approximately 6.0) and oliguria, background of nephrotic syndrome.   Inpatient Medications    Scheduled Meds:  acetaminophen  1,000 mg Oral Q6H   Or   acetaminophen (TYLENOL) oral liquid 160 mg/5 mL  1,000 mg Oral Q6H   atorvastatin  80 mg Oral Daily   bisacodyl  10 mg Oral Daily   carvedilol  12.5 mg Oral BID WC   Chlorhexidine Gluconate Cloth  6 each Topical Daily   gabapentin  100 mg Oral QHS   heparin injection (subcutaneous)  5,000 Units Subcutaneous Q8H   hydrALAZINE  25 mg Oral Q12H   insulin aspart  0-9 Units Subcutaneous TID WC & HS   isosorbide mononitrate  15 mg Oral Daily   metoCLOPramide (REGLAN) injection  10 mg Intravenous Q12H   pantoprazole  40 mg Oral Daily   senna-docusate  1 tablet Oral QHS   Continuous Infusions:  albumin human 25 g (06/22/22 0904)   promethazine (PHENERGAN) injection (IM or IVPB)     PRN Meds: nitroGLYCERIN, ondansetron (ZOFRAN) IV, mouth rinse, oxyCODONE, promethazine (PHENERGAN) injection (IM or IVPB), traZODone   Vital Signs    Vitals:   06/22/22 0500 06/22/22 0618 06/22/22 0700 06/22/22 0800  BP: 128/66 (!) 147/70    Pulse: 69 70 69   Resp: 11 13 16    Temp:    97.8 F (36.6 C)  TempSrc:    Oral  SpO2: 99% 99% 98%   Weight: 67.6 kg     Height:         Intake/Output Summary (Last 24 hours) at 06/22/2022 0916 Last data filed at 06/22/2022 0600 Gross per 24 hour  Intake 1314.52 ml  Output 290 ml  Net 1024.52 ml      06/22/2022    5:00 AM 06/21/2022    5:00 AM 06/20/2022    5:00 AM  Last 3 Weights  Weight (lbs) 149 lb 0.5 oz 145 lb 1 oz 145 lb 8.1 oz  Weight (kg) 67.6 kg 65.8 kg 66 kg      Telemetry    Sinus rhythm- Personally Reviewed  ECG    No new tracing- Personally Reviewed  Physical Exam  Appears comfortable sitting up in chair GEN: No acute distress.   Neck: 7-8 cm JVD Cardiac: RRR, no murmurs, rubs, or gallops.  Respiratory: Dullness to percussion and diminished breath sounds in the bases, particularly on left side GI: Soft, nontender, non-distended  MS: 1-2+ pedal edema; No deformity. Neuro:  Nonfocal  Psych: Normal affect   Labs    High Sensitivity Troponin:   Recent Labs  Lab 06/16/22 0955 06/16/22 1240  TROPONINIHS 44* 51*     Chemistry Recent Labs  Lab 06/20/22 0320 06/20/22 1549 06/21/22 0347 06/21/22 1640 06/22/22 0316  NA 138   < > 139 137 138  K 3.8   < > 4.2 4.3 4.3  CL 106   < > 107  106 109  CO2 16*   < > 16* 15* 16*  GLUCOSE 87   < > 96 151* 118*  BUN 59*   < > 62* 66* 67*  CREATININE 5.64*   < > 6.06* 6.07* 6.07*  CALCIUM 7.7*   < > 7.6* 7.4* 7.4*  PROT 4.6*  --  4.5*  --  4.4*  ALBUMIN 2.6*   < > 2.3* 2.3* 2.1*  AST 7*  --  15  --  37  ALT <5  --  <5  --  9  ALKPHOS 73  --  110  --  182*  BILITOT 0.1*  --  0.4  --  0.2*  GFRNONAA 8*   < > 7* 7* 7*  ANIONGAP 16*   < > 16* 16* 13   < > = values in this interval not displayed.    Lipids No results for input(s): "CHOL", "TRIG", "HDL", "LABVLDL", "LDLCALC", "CHOLHDL" in the last 168 hours.  Hematology Recent Labs  Lab 06/20/22 0320 06/21/22 0347 06/22/22 0316  WBC 10.6* 13.6* 13.3*  RBC 2.99* 3.03* 3.06*  HGB 8.4* 8.8* 8.5*  HCT 26.4* 26.3* 27.4*  MCV 88.3 86.8 89.5  MCH 28.1 29.0 27.8  MCHC 31.8 33.5 31.0  RDW  16.8* 16.7* 16.8*  PLT 217 207 217   Thyroid No results for input(s): "TSH", "FREET4" in the last 168 hours.  BNP Recent Labs  Lab 06/16/22 0955  BNP 3,614.4*    DDimer No results for input(s): "DDIMER" in the last 168 hours.   Radiology    DG Chest Port 1 View  Result Date: 06/22/2022 CLINICAL DATA:  Status post subxiphoid pericardial window. History of chest tube. Evaluate airspace disease/atelectasis/pleural effusion/pneumothorax. EXAM: PORTABLE CHEST 1 VIEW COMPARISON:  06/21/2022 FINDINGS: Persistent densities near the right lung apex with a surgical clip/marker. Stable enlargement of the cardiac silhouette. Evidence for bilateral pleural effusions. Right pleural effusion has likely enlarged. Persistent air bronchograms and consolidation at the left lung base. Increased hazy densities at the right lung base are suggestive for atelectasis. Evidence for small right apical pneumothorax which may be slightly complex. IMPRESSION: 1. Probable right apical pneumothorax that appears to be slightly complex. Minimal change from the recent comparison examination. 2. Bilateral pleural effusions with bibasilar atelectasis. Right pleural effusion has likely enlarged. 3. Persistent air bronchograms with consolidation at the left lung base. 4. Stable enlargement of the cardiac silhouette. Electronically Signed   By: Markus Daft M.D.   On: 06/22/2022 08:28   ECHOCARDIOGRAM LIMITED  Result Date: 06/21/2022    ECHOCARDIOGRAM LIMITED REPORT   Patient Name:   Amber Cross Date of Exam: 06/21/2022 Medical Rec #:  [id number]       Height:       67.0 in Accession #:    [id number]      Weight:       145.1 lb Date of Birth:  20-Jul-1961       BSA:          1.764 m Patient Age:    61 years        BP:           142/67 mmHg Patient Gender: F               HR:           74 bpm. Exam Location:  Inpatient Procedure: Limited Echo, 3D Echo, Cardiac Doppler and Color Doppler Indications:    Pericardial effusion I31.3   History:  Patient has prior history of Echocardiogram examinations, most                 recent 06/17/2022. CHF; Risk Factors:Hypertension, Diabetes and                 Dyslipidemia. Chronic kidney disease.  Sonographer:    Darlina Sicilian RDCS Referring Phys: [phone] [facility] Javanna Patin  Sonographer Comments: Limited Subcostal windows, post pericaridal window. IMPRESSIONS  1. Left ventricular ejection fraction, by estimation, is 40 to 45%. Left ventricular ejection fraction by 3D volume is 47 %. The left ventricle has mildly decreased function. There is severe concentric left ventricular hypertrophy. Left ventricular diastolic parameters are consistent with Grade II diastolic dysfunction (pseudonormalization).  2. Right ventricular systolic function is moderately reduced. The right ventricular size is normal. There is mildly elevated pulmonary artery systolic pressure. The estimated right ventricular systolic pressure is 85.4 mmHg.  3. Large pleural effusion in the left lateral region.  4. The mitral valve is abnormal. Trivial mitral valve regurgitation. No evidence of mitral stenosis.  5. Tricuspid valve regurgitation is moderate.  6. The aortic valve is tricuspid. Aortic valve regurgitation is not visualized. No aortic stenosis is present.  7. The inferior vena cava is dilated in size with <50% respiratory variability, suggesting right atrial pressure of 15 mmHg. Comparison(s): Prior images reviewed side by side. Pericardial effusion has improved. FINDINGS  Left Ventricle: Left ventricular ejection fraction, by estimation, is 40 to 45%. Left ventricular ejection fraction by 3D volume is 47 %. The left ventricle has mildly decreased function. The left ventricular internal cavity size was normal in size. There is severe concentric left ventricular hypertrophy. Left ventricular diastolic parameters are consistent with Grade II diastolic dysfunction (pseudonormalization). Right Ventricle: The right ventricular size is  normal. No increase in right ventricular wall thickness. Right ventricular systolic function is moderately reduced. There is mildly elevated pulmonary artery systolic pressure. The tricuspid regurgitant velocity is 2.63 m/s, and with an assumed right atrial pressure of 15 mmHg, the estimated right ventricular systolic pressure is 62.7 mmHg. Left Atrium: Left atrial size was not assessed. Right Atrium: Right atrial size was not assessed. Pericardium: Trivial pericardial effusion is present. The pericardial effusion is posterior to the left ventricle and circumferential. Mitral Valve: 2D MVA 4.34 cm2. The mitral valve is abnormal. Trivial mitral valve regurgitation. No evidence of mitral valve stenosis. Tricuspid Valve: The tricuspid valve is grossly normal. Tricuspid valve regurgitation is moderate. Aortic Valve: The aortic valve is tricuspid. Aortic valve regurgitation is not visualized. No aortic stenosis is present. Pulmonic Valve: The pulmonic valve was normal in structure. Pulmonic valve regurgitation is trivial. No evidence of pulmonic stenosis. Venous: The inferior vena cava is dilated in size with less than 50% respiratory variability, suggesting right atrial pressure of 15 mmHg. Additional Comments: There is a large pleural effusion in the left lateral region. Spectral Doppler performed. Color Doppler performed.  LEFT VENTRICLE PLAX 2D LVIDd:         4.45 cm         Diastology LVIDs:         3.56 cm         LV e' medial:    3.57 cm/s LV PW:         1.50 cm         LV E/e' medial:  31.7 LV IVS:        1.25 cm         LV e' lateral:  4.19 cm/s                                LV E/e' lateral: 27.0  LV Volumes (MOD) LV vol d, MOD    102.0 ml      3D Volume EF A2C:                           LV 3D EF:    Left LV vol d, MOD    126.0 ml                   ventricul A4C:                                        ar LV vol s, MOD    66.4 ml                    ejection A2C:                                        fraction  LV vol s, MOD    69.0 ml                    by 3D A4C:                                        volume is LV SV MOD A2C:   35.7 ml                    47 %. LV SV MOD A4C:   126.0 ml LV SV MOD BP:    48.1 ml                                3D Volume EF:                                3D EF:        47 %                                LV EDV:       150 ml                                LV ESV:       79 ml                                LV SV:        71 ml AORTIC VALVE LVOT Vmax:   88.30 cm/s LVOT Vmean:  63.700 cm/s LVOT VTI:    0.185 m MITRAL VALVE                TRICUSPID VALVE MV Area (PHT): 4.21 cm     TR Peak  grad:   27.7 mmHg MV Decel Time: 180 msec     TR Vmax:        263.00 cm/s MV E velocity: 113.00 cm/s MV A velocity: 67.30 cm/s   SHUNTS MV E/A ratio:  1.68         Systemic VTI: 0.18 m Rudean Haskell MD Electronically signed by Rudean Haskell MD Signature Date/Time: 06/21/2022/10:34:34 AM    Final    DG CHEST PORT 1 VIEW  Result Date: 06/21/2022 CLINICAL DATA:  Pleural effusion, shortness of breath. EXAM: PORTABLE CHEST 1 VIEW COMPARISON:  Chest radiograph dated 06/20/2022. FINDINGS: The heart is enlarged. There is a small left pleural effusion with associated atelectasis/airspace disease. There is mild right basilar atelectasis/airspace disease. There is no definite right pleural effusion. There is no pneumothorax. Degenerative changes are seen in the spine. IMPRESSION: 1. Small left pleural effusion with associated atelectasis/airspace disease. 2. Mild right basilar atelectasis/airspace disease. 3. Cardiomegaly. Electronically Signed   By: Zerita Boers M.D.   On: 06/21/2022 08:37    Cardiac Studies   Echocardiogram 06/21/2022  1. Left ventricular ejection fraction, by estimation, is 40 to 45%. Left ventricular ejection fraction by 3D volume is 47 %. The left ventricle has mildly decreased function. There is severe concentric left ventricular hypertrophy. Left ventricular diastolic  parameters are consistent with Grade II diastolic dysfunction (pseudonormalization).  2. Right ventricular systolic function is moderately reduced. The right ventricular size is normal. There is mildly elevated pulmonary artery systolic pressure. The estimated right ventricular systolic pressure is 67.0 mmHg.  3. Large pleural effusion in the left lateral region.  4. The mitral valve is abnormal. Trivial mitral valve regurgitation. No evidence of mitral stenosis.  5. Tricuspid valve regurgitation is moderate.  6. The aortic valve is tricuspid. Aortic valve regurgitation is not visualized. No aortic stenosis is present.  7. The inferior vena cava is dilated in size with <50% respiratory variability, suggesting right atrial pressure of 15 mmHg. Comparison(s): Prior images reviewed side by side. Pericardial effusion has improved.   Patient Profile     61 y.o. female  with a hx of DM, HTN, HLD, CVA, tob use, CHF, CKD III, nephrotic syndrome, depression, RUL small cell lung Ca s/p XRT July 2023, COPD, hypoxia on home O2, now postop day 2 after pericardial window for large pericardial effusion with echo evidence of tamponade.   Assessment & Plan   She is hypervolemic, but breathing comfortably and oxygenating well. Severe renal dysfunction, but creatinine appears to have reached a plateau. Start diuretics when okay with nephrology.       For questions or updates, please contact [place] Please consult [email or web address] for contact info under        Signed, Sanda Klein, MD  06/22/2022, 9:16 AM

## 2022-06-22 NOTE — Progress Notes (Signed)
Nephrology Follow-Up Consult note   Assessment/Recommendations: Amber Cross is a/an 61 y.o. female with a past medical history significant for HFrEF, HTN, DM2, small cell lung cancer, chronic hypoxic respiratory failure, and CKD IV who presents with acute on chronic hypoxic respiratory failure   AKI on CKD 4: Likely secondary to hemodynamic mediated process possibly some degree of cardiorenal syndrome and possibly some ATN.  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  IV albumin 25 g x 2 doses -Continue to monitor urine output closely -Continue to monitor daily Cr, Dose meds for GFR -Monitor Daily I/Os, Daily weight  -Maintain MAP>65 for optimal renal perfusion.  -Avoid nephrotoxic medications including NSAIDs -Use synthetic opioids (Fentanyl/Dilaudid) if needed -Currently no indication for HD.  Discussed with her outpatient nephrologist who states he has significant hesitancy regarding pursuing dialysis in the setting of her cancer.  Will continue to monitor closely and consider involvement of palliative care   Acute on chronic hypoxic respiratory failure: Continue with volume optimization as desired based on cardiac status.  Supplemental oxygen as needed. Overall improved   Pericardial effusion: Status post pericardial window on 06/18/2022.  Cytology negative for malignancy. Cardiothoracic surgery following.   Hypertension: Continue current medications and titrate as needed   HFrEF: Feel she [place] euvolemic. Can stop IVFs for now   Metabolic acidosis: likely 2/2 CKD.  Continue oral sodium bicarbonate   Anemia: Likely multifactorial cause. Iron sat 29. Avoiding ESA given malignancy. Transfusion as needed   Recommendations conveyed to primary service.    [facility] 06/22/2022 8:01 AM  ___________________________________________________________  CC: SOB  Interval History/Subjective: Continues  to have low urine output with 300 cc made over the past 24 hours.  Denies significant shortness of breath or chest pain.  Creatinine essentially stable today.  Does notice worsening edema   Medications:  Current Facility-Administered Medications  Medication Dose Route Frequency Provider Last Rate Last Admin   acetaminophen (TYLENOL) tablet 1,000 mg  1,000 mg Oral Q6H Stehler, Bailey C, PA-C   1,000 mg at 06/22/22 6440   Or   acetaminophen (TYLENOL) 160 MG/5ML solution 1,000 mg  1,000 mg Oral Q6H Stehler, Bailey C, PA-C       albumin human 25 % solution 25 g  25 g Intravenous Q6H Reesa Chew, MD       atorvastatin (LIPITOR) tablet 80 mg  80 mg Oral Daily Barrett, Erin R, PA-C   80 mg at 06/21/22 0814   bisacodyl (DULCOLAX) EC tablet 10 mg  10 mg Oral Daily Wynelle Beckmann C, PA-C   10 mg at 06/19/22 0947   carvedilol (COREG) tablet 12.5 mg  12.5 mg Oral BID WC Reesa Chew, MD   12.5 mg at 06/22/22 3474   Chlorhexidine Gluconate Cloth 2 % PADS 6 each  6 each Topical Daily Dahlia Byes, MD   6 each at 06/21/22 1254   gabapentin (NEURONTIN) capsule 100 mg  100 mg Oral QHS Stehler, Bailey C, PA-C   100 mg at 06/21/22 2205   heparin injection 5,000 Units  5,000 Units Subcutaneous Q8H Gold, Wayne E, PA-C   5,000 Units at 06/22/22 0617   hydrALAZINE (APRESOLINE) tablet 25 mg  25 mg Oral Q12H Reesa Chew, MD   25 mg at 06/22/22 0618   insulin aspart (novoLOG) injection 0-9 Units  0-9 Units Subcutaneous TID WC & HS Dahlia Byes, MD   2 Units at 06/21/22 2205   isosorbide  mononitrate (IMDUR) 24 hr tablet 15 mg  15 mg Oral Daily Reesa Chew, MD   15 mg at 06/21/22 0814   metoCLOPramide (REGLAN) injection 10 mg  10 mg Intravenous Q12H Dahlia Byes, MD   10 mg at 06/21/22 2205   nitroGLYCERIN (NITROSTAT) SL tablet 0.4 mg  0.4 mg Sublingual Q5 min PRN Magdalene River, PA-C       ondansetron [facility]) injection 4 mg  4 mg Intravenous Q6H PRN Dahlia Byes, MD   4 mg at  06/19/22 0945   Oral care mouth rinse  15 mL Mouth Rinse PRN Dahlia Byes, MD       oxyCODONE (Oxy IR/ROXICODONE) immediate release tablet 5-10 mg  5-10 mg Oral Q4H PRN Wynelle Beckmann C, PA-C   10 mg at 06/21/22 2051   pantoprazole (PROTONIX) EC tablet 40 mg  40 mg Oral Daily Magdalene River, PA-C   40 mg at 06/21/22 1779   promethazine (PHENERGAN) 6.25 mg in sodium chloride 0.9 % 50 mL IVPB  6.25 mg Intravenous Q6H PRN Dahlia Byes, MD       senna-docusate (Senokot-S) tablet 1 tablet  1 tablet Oral QHS Magdalene River, PA-C   1 tablet at 06/20/22 2111   traZODone (DESYREL) tablet 50 mg  50 mg Oral QHS PRN Magdalene River, PA-C   50 mg at 06/18/22 2154      Review of Systems: 10 systems reviewed and negative except per interval history/subjective  Physical Exam: Vitals:   06/22/22 0618 06/22/22 0700  BP: (!) 147/70   Pulse: 70 69  Resp: 13 16  Temp:    SpO2: 99% 98%   No intake/output data recorded.  Intake/Output Summary (Last 24 hours) at 06/22/2022 0801 Last data filed at 06/22/2022 0600 Gross per 24 hour  Intake 1314.52 ml  Output 290 ml  Net 1024.52 ml   Constitutional: well-appearing, no acute distress ENMT: ears and nose without scars or lesions, MMM CV: normal rate, 1+ pitting edema in the bilateral lower extremities around the ankle Respiratory: Bilateral chest rise, normal work of breathing Gastrointestinal: soft, non-tender, no palpable masses or hernias Skin: no visible lesions or rashes Psych: Awake and alert but seems slightly confused   Test Results I personally reviewed new and old clinical labs and radiology tests Lab Results  Component Value Date   NA 138 06/22/2022   K 4.3 06/22/2022   CL 109 06/22/2022   CO2 16 (L) 06/22/2022   BUN 67 (H) 06/22/2022   CREATININE 6.07 (H) 06/22/2022   CALCIUM 7.4 (L) 06/22/2022   ALBUMIN 2.1 (L) 06/22/2022   PHOS 7.4 (H) 06/21/2022    CBC Recent Labs  Lab 06/16/22 0955 06/16/22 1012  06/18/22 0617 06/18/22 0855 06/20/22 0320 06/21/22 0347 06/22/22 0316  WBC 11.1*   < > 7.0   < > 10.6* 13.6* 13.3*  NEUTROABS 9.3*  --  5.4  --   --   --   --   HGB 10.0*   < > 10.0*   < > 8.4* 8.8* 8.5*  HCT 31.8*   < > 29.5*   < > 26.4* 26.3* 27.4*  MCV 86.9   < > 85.0   < > 88.3 86.8 89.5  PLT 440*   < > 266   < > 217 207 217   < > = values in this interval not displayed.

## 2022-06-22 NOTE — Progress Notes (Signed)
     [id number]       [place] [address]             [phone]       EVENING ROUNDS  Stable No urine output recorded today No increase in O2 needs Awaiting renal recovery

## 2022-06-23 DIAGNOSIS — I161 Hypertensive emergency: Secondary | ICD-10-CM | POA: Diagnosis not present

## 2022-06-23 LAB — CBC
HCT: 25.7 % — ABNORMAL LOW (ref 36.0–46.0)
Hemoglobin: 8 g/dL — ABNORMAL LOW (ref 12.0–15.0)
MCH: 27.4 pg (ref 26.0–34.0)
MCHC: 31.1 g/dL (ref 30.0–36.0)
MCV: 88 fL (ref 80.0–100.0)
Platelets: 218 10*3/uL (ref 150–400)
RBC: 2.92 MIL/uL — ABNORMAL LOW (ref 3.87–5.11)
RDW: 16.5 % — ABNORMAL HIGH (ref 11.5–15.5)
WBC: 10.7 10*3/uL — ABNORMAL HIGH (ref 4.0–10.5)
nRBC: 0 % (ref 0.0–0.2)

## 2022-06-23 LAB — COMPREHENSIVE METABOLIC PANEL
ALT: 7 U/L (ref 0–44)
AST: 20 U/L (ref 15–41)
Albumin: 2.4 g/dL — ABNORMAL LOW (ref 3.5–5.0)
Alkaline Phosphatase: 181 U/L — ABNORMAL HIGH (ref 38–126)
Anion gap: 12 (ref 5–15)
BUN: 69 mg/dL — ABNORMAL HIGH (ref 6–20)
CO2: 15 mmol/L — ABNORMAL LOW (ref 22–32)
Calcium: 7.1 mg/dL — ABNORMAL LOW (ref 8.9–10.3)
Chloride: 108 mmol/L (ref 98–111)
Creatinine, Ser: 6.24 mg/dL — ABNORMAL HIGH (ref 0.44–1.00)
GFR, Estimated: 7 mL/min — ABNORMAL LOW (ref >60)
Glucose, Bld: 98 mg/dL (ref 70–99)
Potassium: 4.4 mmol/L (ref 3.5–5.1)
Sodium: 135 mmol/L (ref 135–145)
Total Bilirubin: 0.3 mg/dL (ref 0.3–1.2)
Total Protein: 4.8 g/dL — ABNORMAL LOW (ref 6.5–8.1)

## 2022-06-23 LAB — AEROBIC/ANAEROBIC CULTURE W GRAM STAIN (SURGICAL/DEEP WOUND)
Culture: NO GROWTH
Culture: NO GROWTH
Gram Stain: NONE SEEN
Gram Stain: NONE SEEN

## 2022-06-23 LAB — RENAL FUNCTION PANEL
Albumin: 2.5 g/dL — ABNORMAL LOW (ref 3.5–5.0)
Anion gap: 15 (ref 5–15)
BUN: 71 mg/dL — ABNORMAL HIGH (ref 6–20)
CO2: 14 mmol/L — ABNORMAL LOW (ref 22–32)
Calcium: 7.5 mg/dL — ABNORMAL LOW (ref 8.9–10.3)
Chloride: 109 mmol/L (ref 98–111)
Creatinine, Ser: 6.32 mg/dL — ABNORMAL HIGH (ref 0.44–1.00)
GFR, Estimated: 7 mL/min — ABNORMAL LOW (ref >60)
Glucose, Bld: 130 mg/dL — ABNORMAL HIGH (ref 70–99)
Phosphorus: 7.2 mg/dL — ABNORMAL HIGH (ref 2.5–4.6)
Potassium: 4.8 mmol/L (ref 3.5–5.1)
Sodium: 138 mmol/L (ref 135–145)

## 2022-06-23 LAB — GLUCOSE, CAPILLARY
Glucose-Capillary: 91 mg/dL (ref 70–99)
Glucose-Capillary: 137 mg/dL — ABNORMAL HIGH (ref 70–99)
Glucose-Capillary: 144 mg/dL — ABNORMAL HIGH (ref 70–99)
Glucose-Capillary: 129 mg/dL — ABNORMAL HIGH (ref 70–99)

## 2022-06-23 NOTE — Progress Notes (Signed)
Rounding Note    Patient Name: Amber Cross Date of Encounter: 06/23/2022  [facility] Cardiologist: Berniece Salines, DO   Subjective   No chest pain, no dyspnea. Remains oliguric (400 mL / 24 hours) and creatinine shows minimal increase up to 6.26 today. A little sleepy, but otherwise no symptoms of uremia.  Inpatient Medications    Scheduled Meds:  acetaminophen  1,000 mg Oral Q6H   Or   acetaminophen (TYLENOL) oral liquid 160 mg/5 mL  1,000 mg Oral Q6H   atorvastatin  80 mg Oral Daily   bisacodyl  10 mg Oral Daily   carvedilol  12.5 mg Oral BID WC   Chlorhexidine Gluconate Cloth  6 each Topical Daily   gabapentin  100 mg Oral QHS   heparin injection (subcutaneous)  5,000 Units Subcutaneous Q8H   hydrALAZINE  25 mg Oral Q12H   insulin aspart  0-9 Units Subcutaneous TID WC & HS   isosorbide mononitrate  15 mg Oral Daily   metoCLOPramide (REGLAN) injection  10 mg Intravenous Q12H   pantoprazole  40 mg Oral Daily   senna-docusate  1 tablet Oral QHS   sevelamer carbonate  800 mg Oral TID WC   Continuous Infusions:  promethazine (PHENERGAN) injection (IM or IVPB)     PRN Meds: nitroGLYCERIN, ondansetron (ZOFRAN) IV, mouth rinse, oxyCODONE, promethazine (PHENERGAN) injection (IM or IVPB), traZODone   Vital Signs    Vitals:   06/23/22 0500 06/23/22 0600 06/23/22 0625 06/23/22 0700  BP: 135/66 (!) 145/67 (!) 147/66   Pulse: 68 67 68 70  Resp: 11 13 14 19   Temp:      TempSrc:      SpO2: 99% 98% 95% 93%  Weight: 67.9 kg     Height:        Intake/Output Summary (Last 24 hours) at 06/23/2022 0911 Last data filed at 06/23/2022 0600 Gross per 24 hour  Intake 220 ml  Output 400 ml  Net -180 ml      06/23/2022    5:00 AM 06/22/2022    5:00 AM 06/21/2022    5:00 AM  Last 3 Weights  Weight (lbs) 149 lb 11.1 oz 149 lb 0.5 oz 145 lb 1 oz  Weight (kg) 67.9 kg 67.6 kg 65.8 kg      Telemetry    NSR - Personally Reviewed  ECG    No new tracing-  Personally Reviewed  Physical Exam  Appears comfortable GEN: No acute distress.   Neck: Unchanged 7-8 cm JVD JVD Cardiac: RRR, no murmurs, rubs, or gallops.  Respiratory: Dullness to percussion and diminished breath sounds in the bases bilaterally, roughly 1/3 up on the left, less on the right. GI: Soft, nontender, non-distended  MS: 1-2+ pedal edema; No deformity. Neuro:  Nonfocal  Psych: Normal affect   Labs    High Sensitivity Troponin:   Recent Labs  Lab 06/16/22 0955 06/16/22 1240  TROPONINIHS 44* 51*     Chemistry Recent Labs  Lab 06/21/22 0347 06/21/22 1640 06/22/22 0316 06/22/22 1625 06/23/22 0245  NA 139   < > 138 136 135  K 4.2   < > 4.3 4.5 4.4  CL 107   < > 109 108 108  CO2 16*   < > 16* 15* 15*  GLUCOSE 96   < > 118* 111* 98  BUN 62*   < > 67* 66* 69*  CREATININE 6.06*   < > 6.07* 6.05* 6.24*  CALCIUM 7.6*   < >  7.4* 7.4* 7.1*  PROT 4.5*  --  4.4*  --  4.8*  ALBUMIN 2.3*   < > 2.1* 2.9* 2.4*  AST 15  --  37  --  20  ALT <5  --  9  --  7  ALKPHOS 110  --  182*  --  181*  BILITOT 0.4  --  0.2*  --  0.3  GFRNONAA 7*   < > 7* 7* 7*  ANIONGAP 16*   < > 13 13 12    < > = values in this interval not displayed.    Lipids No results for input(s): "CHOL", "TRIG", "HDL", "LABVLDL", "LDLCALC", "CHOLHDL" in the last 168 hours.  Hematology Recent Labs  Lab 06/21/22 0347 06/22/22 0316 06/23/22 0245  WBC 13.6* 13.3* 10.7*  RBC 3.03* 3.06* 2.92*  HGB 8.8* 8.5* 8.0*  HCT 26.3* 27.4* 25.7*  MCV 86.8 89.5 88.0  MCH 29.0 27.8 27.4  MCHC 33.5 31.0 31.1  RDW 16.7* 16.8* 16.5*  PLT 207 217 218   Thyroid No results for input(s): "TSH", "FREET4" in the last 168 hours.  BNP Recent Labs  Lab 06/16/22 0955  BNP 3,614.4*    DDimer No results for input(s): "DDIMER" in the last 168 hours.   Radiology    DG Chest Port 1 View  Result Date: 06/22/2022 CLINICAL DATA:  Status post subxiphoid pericardial window. History of chest tube. Evaluate airspace  disease/atelectasis/pleural effusion/pneumothorax. EXAM: PORTABLE CHEST 1 VIEW COMPARISON:  06/21/2022 FINDINGS: Persistent densities near the right lung apex with a surgical clip/marker. Stable enlargement of the cardiac silhouette. Evidence for bilateral pleural effusions. Right pleural effusion has likely enlarged. Persistent air bronchograms and consolidation at the left lung base. Increased hazy densities at the right lung base are suggestive for atelectasis. Evidence for small right apical pneumothorax which may be slightly complex. IMPRESSION: 1. Probable right apical pneumothorax that appears to be slightly complex. Minimal change from the recent comparison examination. 2. Bilateral pleural effusions with bibasilar atelectasis. Right pleural effusion has likely enlarged. 3. Persistent air bronchograms with consolidation at the left lung base. 4. Stable enlargement of the cardiac silhouette. Electronically Signed   By: Markus Daft M.D.   On: 06/22/2022 08:28   ECHOCARDIOGRAM LIMITED  Result Date: 06/21/2022    ECHOCARDIOGRAM LIMITED REPORT   Patient Name:   [facility] Date of Exam: 06/21/2022 Medical Rec #:  [id number]       Height:       67.0 in Accession #:    [id number]      Weight:       145.1 lb Date of Birth:  October 31, 1960       BSA:          1.764 m Patient Age:    67 years        BP:           142/67 mmHg Patient Gender: F               HR:           74 bpm. Exam Location:  Inpatient Procedure: Limited Echo, 3D Echo, Cardiac Doppler and Color Doppler Indications:    Pericardial effusion I31.3  History:        Patient has prior history of Echocardiogram examinations, most                 recent 06/17/2022. CHF; Risk Factors:Hypertension, Diabetes and  Dyslipidemia. Chronic kidney disease.  Sonographer:    Darlina Sicilian RDCS Referring Phys: [phone] [facility] Neoma Uhrich  Sonographer Comments: Limited Subcostal windows, post pericaridal window. IMPRESSIONS  1. Left ventricular ejection  fraction, by estimation, is 40 to 45%. Left ventricular ejection fraction by 3D volume is 47 %. The left ventricle has mildly decreased function. There is severe concentric left ventricular hypertrophy. Left ventricular diastolic parameters are consistent with Grade II diastolic dysfunction (pseudonormalization).  2. Right ventricular systolic function is moderately reduced. The right ventricular size is normal. There is mildly elevated pulmonary artery systolic pressure. The estimated right ventricular systolic pressure is 62.8 mmHg.  3. Large pleural effusion in the left lateral region.  4. The mitral valve is abnormal. Trivial mitral valve regurgitation. No evidence of mitral stenosis.  5. Tricuspid valve regurgitation is moderate.  6. The aortic valve is tricuspid. Aortic valve regurgitation is not visualized. No aortic stenosis is present.  7. The inferior vena cava is dilated in size with <50% respiratory variability, suggesting right atrial pressure of 15 mmHg. Comparison(s): Prior images reviewed side by side. Pericardial effusion has improved. FINDINGS  Left Ventricle: Left ventricular ejection fraction, by estimation, is 40 to 45%. Left ventricular ejection fraction by 3D volume is 47 %. The left ventricle has mildly decreased function. The left ventricular internal cavity size was normal in size. There is severe concentric left ventricular hypertrophy. Left ventricular diastolic parameters are consistent with Grade II diastolic dysfunction (pseudonormalization). Right Ventricle: The right ventricular size is normal. No increase in right ventricular wall thickness. Right ventricular systolic function is moderately reduced. There is mildly elevated pulmonary artery systolic pressure. The tricuspid regurgitant velocity is 2.63 m/s, and with an assumed right atrial pressure of 15 mmHg, the estimated right ventricular systolic pressure is 31.5 mmHg. Left Atrium: Left atrial size was not assessed. Right Atrium:  Right atrial size was not assessed. Pericardium: Trivial pericardial effusion is present. The pericardial effusion is posterior to the left ventricle and circumferential. Mitral Valve: 2D MVA 4.34 cm2. The mitral valve is abnormal. Trivial mitral valve regurgitation. No evidence of mitral valve stenosis. Tricuspid Valve: The tricuspid valve is grossly normal. Tricuspid valve regurgitation is moderate. Aortic Valve: The aortic valve is tricuspid. Aortic valve regurgitation is not visualized. No aortic stenosis is present. Pulmonic Valve: The pulmonic valve was normal in structure. Pulmonic valve regurgitation is trivial. No evidence of pulmonic stenosis. Venous: The inferior vena cava is dilated in size with less than 50% respiratory variability, suggesting right atrial pressure of 15 mmHg. Additional Comments: There is a large pleural effusion in the left lateral region. Spectral Doppler performed. Color Doppler performed.  LEFT VENTRICLE PLAX 2D LVIDd:         4.45 cm         Diastology LVIDs:         3.56 cm         LV e' medial:    3.57 cm/s LV PW:         1.50 cm         LV E/e' medial:  31.7 LV IVS:        1.25 cm         LV e' lateral:   4.19 cm/s                                LV E/e' lateral: 27.0  LV Volumes (MOD) LV vol d, MOD  102.0 ml      3D Volume EF A2C:                           LV 3D EF:    Left LV vol d, MOD    126.0 ml                   ventricul A4C:                                        ar LV vol s, MOD    66.4 ml                    ejection A2C:                                        fraction LV vol s, MOD    69.0 ml                    by 3D A4C:                                        volume is LV SV MOD A2C:   35.7 ml                    47 %. LV SV MOD A4C:   126.0 ml LV SV MOD BP:    48.1 ml                                3D Volume EF:                                3D EF:        47 %                                LV EDV:       150 ml                                LV ESV:       79 ml                                 LV SV:        71 ml AORTIC VALVE LVOT Vmax:   88.30 cm/s LVOT Vmean:  63.700 cm/s LVOT VTI:    0.185 m MITRAL VALVE                TRICUSPID VALVE MV Area (PHT): 4.21 cm     TR Peak grad:   27.7 mmHg MV Decel Time: 180 msec     TR Vmax:        263.00 cm/s MV E velocity: 113.00 cm/s MV A velocity: 67.30 cm/s   SHUNTS MV E/A ratio:  1.68  Systemic VTI: 0.18 m Rudean Haskell MD Electronically signed by Rudean Haskell MD Signature Date/Time: 06/21/2022/10:34:34 AM    Final     Cardiac Studies   Echocardiogram 06/21/2022   1. Left ventricular ejection fraction, by estimation, is 40 to 45%. Left ventricular ejection fraction by 3D volume is 47 %. The left ventricle has mildly decreased function. There is severe concentric left ventricular hypertrophy. Left ventricular diastolic parameters are consistent with Grade II diastolic dysfunction (pseudonormalization).   2. Right ventricular systolic function is moderately reduced. The right ventricular size is normal. There is mildly elevated pulmonary artery systolic pressure. The estimated right ventricular systolic pressure is 77.4 mmHg.   3. Large pleural effusion in the left lateral region.   4. The mitral valve is abnormal. Trivial mitral valve regurgitation. No evidence of mitral stenosis.   5. Tricuspid valve regurgitation is moderate.   6. The aortic valve is tricuspid. Aortic valve regurgitation is not visualized. No aortic stenosis is present.   7. The inferior vena cava is dilated in size with <50% respiratory variability, suggesting right atrial pressure of 15 mmHg. Comparison(s): Prior images reviewed side by side. Pericardial effusion has improved.     Patient Profile     61 y.o. female with a hx of DM, HTN, HLD, CVA, tob use, CHF, CKD III, nephrotic syndrome, depression, RUL small cell lung Ca s/p XRT July 2023, COPD, hypoxia on home O2, now postop day 2 after pericardial window for large pericardial  effusion with echo evidence of tamponade.     Assessment & Plan    Remains oliguric with virtually unchanged severe renal insufficiency, clearly hypervolemic, but without any respiratory distress or symptoms of uremia.  Electrolytes remain okay. Hoping for renal function recovery. Start diuretics per nephrology thinks it is time.     For questions or updates, please contact [place] Please consult [email or web address] for contact info under        Signed, Sanda Klein, MD  06/23/2022, 9:11 AM

## 2022-06-23 NOTE — Progress Notes (Signed)
      [address]       [employer] [id number]             [phone]      5 Days Post-Op  Procedure(s) (LRB): SUBXYPHOID PERICARDIAL WINDOW (N/A) TRANSESOPHAGEAL ECHOCARDIOGRAM (TEE) (N/A) CHEST TUBE INSERTION (Right)  Total Length of Stay:  LOS: 7 days   SUBJECTIVE: Has been ambulating well Slept well Denies SOB Vitals:   06/23/22 0625 06/23/22 0700  BP: (!) 147/66   Pulse: 68 70  Resp: 14 19  Temp:    SpO2: 95% 93%    Intake/Output      11/25 0701 11/26 0700 11/26 0701 11/27 0700   P.O. 340    I.V. (mL/kg)     Total Intake(mL/kg) 340 (5)    Urine (mL/kg/hr) 400 (0.2)    Total Output 400    Net -60             promethazine (PHENERGAN) injection (IM or IVPB)      CBC    Component Value Date/Time   WBC 10.7 (H) 06/23/2022 0245   RBC 2.92 (L) 06/23/2022 0245   HGB 8.0 (L) 06/23/2022 0245   HGB 6.9 (LL) 12/11/2021 1626   HCT 25.7 (L) 06/23/2022 0245   HCT 21.0 (L) 12/11/2021 1626   PLT 218 06/23/2022 0245   PLT 307 12/11/2021 1626   MCV 88.0 06/23/2022 0245   MCV 87 12/11/2021 1626   MCH 27.4 06/23/2022 0245   MCHC 31.1 06/23/2022 0245   RDW 16.5 (H) 06/23/2022 0245   RDW 13.9 12/11/2021 1626   LYMPHSABS 1.0 06/18/2022 0617   MONOABS 0.5 06/18/2022 0617   EOSABS 0.1 06/18/2022 0617   BASOSABS 0.0 06/18/2022 0617   CMP     Component Value Date/Time   NA 135 06/23/2022 0245   NA 138 02/20/2022 1207   K 4.4 06/23/2022 0245   CL 108 06/23/2022 0245   CO2 15 (L) 06/23/2022 0245   GLUCOSE 98 06/23/2022 0245   BUN 69 (H) 06/23/2022 0245   BUN 25 02/20/2022 1207   CREATININE 6.24 (H) 06/23/2022 0245   CALCIUM 7.1 (L) 06/23/2022 0245   PROT 4.8 (L) 06/23/2022 0245   ALBUMIN 2.4 (L) 06/23/2022 0245   AST 20 06/23/2022 0245   ALT 7 06/23/2022 0245   ALKPHOS 181 (H) 06/23/2022 0245   BILITOT 0.3 06/23/2022 0245   GFRNONAA 7 (L) 06/23/2022 0245   GFRAA 120 04/06/2019 1506   ABG    Component Value Date/Time   PHART 7.240 (L)  06/19/2022 0408   PCO2ART 44.6 06/19/2022 0408   PO2ART 70 (L) 06/19/2022 0408   HCO3 19.2 (L) 06/19/2022 0408   TCO2 21 (L) 06/19/2022 0408   ACIDBASEDEF 8.0 (H) 06/19/2022 0408   O2SAT 91 06/19/2022 0408   CBG (last 3)  Recent Labs    06/22/22 2010 06/22/22 2237 06/23/22 0639  GLUCAP 151* 172* 91  EXAM Lungs: decreased at bases Card: RR Ext: 1+edema  ASSESSMENT: SP Pericardial/R pleural drainage Renal insufficiency continues with 400cc urine output yesterday and Cr slightly elevated today. Awaiting return of function prior to move out of unit per Dr Meriam Sprague. Awaiting Renal's input Cont ambulation   Coralie Common, MD 06/23/2022

## 2022-06-23 NOTE — Progress Notes (Signed)
Nephrology Follow-Up Consult note   Assessment/Recommendations: Amber Cross is a/an 61 y.o. female with a past medical history significant for HFrEF, HTN, DM2, small cell lung cancer, chronic hypoxic respiratory failure, and CKD IV who presents with acute on chronic hypoxic respiratory failure   AKI on CKD 4: Likely secondary to hemodynamic mediated process possibly some degree of cardiorenal syndrome and possibly some ATN.  Significant CKD related to diabetes (biopsy proven) with severe IFTA -Creatinine continues to uptrend slightly.  No uremic symptoms.  There is little to be done at this point other than to allow time for the kidneys to heal.  Because of her severe underlying CKD is possible she will not recover -Continue to monitor urine output closely -Continue to monitor daily Cr, Dose meds for GFR -Monitor Daily I/Os, Daily weight  -Maintain MAP>65 for optimal renal perfusion.  -Avoid nephrotoxic medications including NSAIDs -Use synthetic opioids (Fentanyl/Dilaudid) if needed -Currently no indication for HD.  Discussed with her outpatient nephrologist who states he has significant hesitancy regarding pursuing dialysis in the setting of her cancer.  Will continue to monitor closely and consider involvement of palliative care   Acute on chronic hypoxic respiratory failure: Continue with volume optimization as desired based on cardiac status.  Supplemental oxygen as needed. Overall improved   Pericardial effusion: Status post pericardial window on 06/18/2022.  Cytology negative for malignancy. Cardiothoracic surgery following.   Hypertension: Continue current medications and titrate as needed   HFrEF: Some signs of volume overload.  Continue to monitor closely.  No diuretics for now   Metabolic acidosis: likely 2/2 CKD.  Continue oral sodium bicarbonate   Anemia: Likely multifactorial cause. Iron sat 29. Avoiding ESA given malignancy. Transfusion as needed  Small cell lung cancer:  Radiation finished in October.  Planning to see oncology in December for restaging.  Patient thinks she is cancer free but I do not think this is the case given the type of cancer she has.  It may be beneficial to involve oncology at some point to help understand the status of her malignancy, prognosis, expected treatment as this may guide her goals of care   Recommendations conveyed to primary service.    [facility] 06/23/2022 8:45 AM  ___________________________________________________________  CC: SOB  Interval History/Subjective: Patient feels well today eating breakfast. Denies n/v and sob. We discussed her overall care and that kidney function is not improving.  We discussed that if her kidney function was to get much worse or she was starting to exhibit signs of uremia we may have to consider dialysis.  However because of her cancer diagnosis this may not be the best option.  The patient is under the impression that she is cancer free.  She has not seen oncology in some time and completed radiation in October.  She is planning to see oncology again in December.  Is my understanding that she is not cancer free at this time.   Medications:  Current Facility-Administered Medications  Medication Dose Route Frequency Provider Last Rate Last Admin   acetaminophen (TYLENOL) tablet 1,000 mg  1,000 mg Oral Q6H Stehler, Bailey C, PA-C   1,000 mg at 06/22/22 1246   Or   acetaminophen (TYLENOL) 160 MG/5ML solution 1,000 mg  1,000 mg Oral Q6H Stehler, Bailey C, PA-C       atorvastatin (LIPITOR) tablet 80 mg  80 mg Oral Daily Barrett, Erin R, PA-C   80 mg at 06/22/22 0916   bisacodyl (DULCOLAX) EC  tablet 10 mg  10 mg Oral Daily Magdalene River, PA-C   10 mg at 06/19/22 0947   carvedilol (COREG) tablet 12.5 mg  12.5 mg Oral BID WC Reesa Chew, MD   12.5 mg at 06/23/22 0932   Chlorhexidine Gluconate Cloth 2 % PADS 6 each  6 each Topical Daily Dahlia Byes, MD   6 each at 06/22/22 0917   gabapentin (NEURONTIN) capsule 100 mg  100 mg Oral QHS Stehler, Pricilla Larsson, PA-C   100 mg at 06/22/22 2122   heparin injection 5,000 Units  5,000 Units Subcutaneous Q8H GoldPatrick Jupiter E, PA-C   5,000 Units at 06/23/22 3557   hydrALAZINE (APRESOLINE) tablet 25 mg  25 mg Oral Q12H Reesa Chew, MD   25 mg at 06/23/22 3220   insulin aspart (novoLOG) injection 0-9 Units  0-9 Units Subcutaneous TID WC & HS Dahlia Byes, MD   2 Units at 06/22/22 2252   isosorbide mononitrate (IMDUR) 24 hr tablet 15 mg  15 mg Oral Daily Reesa Chew, MD   15 mg at 06/22/22 0917   metoCLOPramide (REGLAN) injection 10 mg  10 mg Intravenous Virl Son, MD   10 mg at 06/22/22 2122   nitroGLYCERIN (NITROSTAT) SL tablet 0.4 mg  0.4 mg Sublingual Q5 min PRN Magdalene River, PA-C       ondansetron [facility]) injection 4 mg  4 mg Intravenous Q6H PRN Dahlia Byes, MD   4 mg at 06/19/22 0945   Oral care mouth rinse  15 mL Mouth Rinse PRN Dahlia Byes, MD       oxyCODONE (Oxy IR/ROXICODONE) immediate release tablet 5-10 mg  5-10 mg Oral Q4H PRN Wynelle Beckmann C, PA-C   10 mg at 06/22/22 1935   pantoprazole (PROTONIX) EC tablet 40 mg  40 mg Oral Daily Wynelle Beckmann C, PA-C   40 mg at 06/22/22 0916   promethazine (PHENERGAN) 6.25 mg in sodium chloride 0.9 % 50 mL IVPB  6.25 mg Intravenous Q6H PRN Dahlia Byes, MD       senna-docusate (Senokot-S) tablet 1 tablet  1 tablet Oral QHS Wynelle Beckmann C, PA-C   1 tablet at 06/22/22 2122   sevelamer carbonate (RENVELA) tablet 800 mg  800 mg Oral TID WC Reesa Chew, MD   800 mg at 06/23/22 0804   traZODone (DESYREL) tablet 50 mg  50 mg Oral QHS PRN Wynelle Beckmann C, PA-C   50 mg at 06/22/22 2256      Review of Systems: 10 systems reviewed and negative except per interval history/subjective  Physical Exam: Vitals:   06/23/22 0625 06/23/22 0700  BP: (!) 147/66   Pulse: 68 70  Resp: 14 19  Temp:    SpO2: 95% 93%    No intake/output data recorded.  Intake/Output Summary (Last 24 hours) at 06/23/2022 0845 Last data filed at 06/23/2022 0600 Gross per 24 hour  Intake 220 ml  Output 400 ml  Net -180 ml   Constitutional: well-appearing, no acute distress ENMT: ears and nose without scars or lesions, MMM CV: normal rate, 1+ pitting edema in the bilateral lower extremities around the ankle Respiratory: Bilateral chest rise, normal work of breathing Gastrointestinal: soft, non-tender, no palpable masses or hernias Skin: no visible lesions or rashes Psych: Awake and alert but seems slightly confused   Test Results I personally reviewed new and old clinical labs and radiology tests Lab Results  Component Value Date   NA 135 06/23/2022  K 4.4 06/23/2022   CL 108 06/23/2022   CO2 15 (L) 06/23/2022   BUN 69 (H) 06/23/2022   CREATININE 6.24 (H) 06/23/2022   CALCIUM 7.1 (L) 06/23/2022   ALBUMIN 2.4 (L) 06/23/2022   PHOS 7.3 (H) 06/22/2022    CBC Recent Labs  Lab 06/16/22 0955 06/16/22 1012 06/18/22 0617 06/18/22 0855 06/21/22 0347 06/22/22 0316 06/23/22 0245  WBC 11.1*   < > 7.0   < > 13.6* 13.3* 10.7*  NEUTROABS 9.3*  --  5.4  --   --   --   --   HGB 10.0*   < > 10.0*   < > 8.8* 8.5* 8.0*  HCT 31.8*   < > 29.5*   < > 26.3* 27.4* 25.7*  MCV 86.9   < > 85.0   < > 86.8 89.5 88.0  PLT 440*   < > 266   < > 207 217 218   < > = values in this interval not displayed.

## 2022-06-23 NOTE — Progress Notes (Signed)
     [address]       [place] [address]             3 [phone]       EVENING ROUNDS   Stable day Appreciate renal input Will continue to monitor Cr and urine output

## 2022-06-24 DIAGNOSIS — I161 Hypertensive emergency: Secondary | ICD-10-CM | POA: Diagnosis not present

## 2022-06-24 LAB — CBC
HCT: 26.1 % — ABNORMAL LOW (ref 36.0–46.0)
Hemoglobin: 8.6 g/dL — ABNORMAL LOW (ref 12.0–15.0)
MCH: 28.6 pg (ref 26.0–34.0)
MCHC: 33 g/dL (ref 30.0–36.0)
MCV: 86.7 fL (ref 80.0–100.0)
Platelets: 260 10*3/uL (ref 150–400)
RBC: 3.01 MIL/uL — ABNORMAL LOW (ref 3.87–5.11)
RDW: 16.4 % — ABNORMAL HIGH (ref 11.5–15.5)
WBC: 9.6 10*3/uL (ref 4.0–10.5)
nRBC: 0 % (ref 0.0–0.2)

## 2022-06-24 LAB — COMPREHENSIVE METABOLIC PANEL
ALT: 5 U/L (ref 0–44)
AST: 14 U/L — ABNORMAL LOW (ref 15–41)
Albumin: 2.2 g/dL — ABNORMAL LOW (ref 3.5–5.0)
Alkaline Phosphatase: 172 U/L — ABNORMAL HIGH (ref 38–126)
Anion gap: 12 (ref 5–15)
BUN: 73 mg/dL — ABNORMAL HIGH (ref 6–20)
CO2: 16 mmol/L — ABNORMAL LOW (ref 22–32)
Calcium: 7.6 mg/dL — ABNORMAL LOW (ref 8.9–10.3)
Chloride: 107 mmol/L (ref 98–111)
Creatinine, Ser: 6.54 mg/dL — ABNORMAL HIGH (ref 0.44–1.00)
GFR, Estimated: 7 mL/min — ABNORMAL LOW (ref >60)
Glucose, Bld: 102 mg/dL — ABNORMAL HIGH (ref 70–99)
Potassium: 4.4 mmol/L (ref 3.5–5.1)
Sodium: 135 mmol/L (ref 135–145)
Total Bilirubin: 0.5 mg/dL (ref 0.3–1.2)
Total Protein: 4.8 g/dL — ABNORMAL LOW (ref 6.5–8.1)

## 2022-06-24 LAB — RENAL FUNCTION PANEL
Albumin: 2.4 g/dL — ABNORMAL LOW (ref 3.5–5.0)
Anion gap: 12 (ref 5–15)
BUN: 71 mg/dL — ABNORMAL HIGH (ref 6–20)
CO2: 16 mmol/L — ABNORMAL LOW (ref 22–32)
Calcium: 7.8 mg/dL — ABNORMAL LOW (ref 8.9–10.3)
Chloride: 110 mmol/L (ref 98–111)
Creatinine, Ser: 6.67 mg/dL — ABNORMAL HIGH (ref 0.44–1.00)
GFR, Estimated: 7 mL/min — ABNORMAL LOW (ref >60)
Glucose, Bld: 120 mg/dL — ABNORMAL HIGH (ref 70–99)
Phosphorus: 7.1 mg/dL — ABNORMAL HIGH (ref 2.5–4.6)
Potassium: 4.7 mmol/L (ref 3.5–5.1)
Sodium: 138 mmol/L (ref 135–145)

## 2022-06-24 LAB — GLUCOSE, CAPILLARY
Glucose-Capillary: 105 mg/dL — ABNORMAL HIGH (ref 70–99)
Glucose-Capillary: 108 mg/dL — ABNORMAL HIGH (ref 70–99)
Glucose-Capillary: 130 mg/dL — ABNORMAL HIGH (ref 70–99)
Glucose-Capillary: 126 mg/dL — ABNORMAL HIGH (ref 70–99)
Glucose-Capillary: 135 mg/dL — ABNORMAL HIGH (ref 70–99)

## 2022-06-24 MED ORDER — SODIUM BICARBONATE 650 MG PO TABS
1300.0000 mg | ORAL_TABLET | Freq: Two times a day (BID) | ORAL | Status: DC
  Administered 2022-06-24 – 2022-06-26 (×6): 1300 mg via ORAL
  Filled 2022-06-24 (×6): qty 2

## 2022-06-24 MED ORDER — OXYCODONE HCL 5 MG PO TABS
5.0000 mg | ORAL_TABLET | ORAL | Status: DC | PRN
  Administered 2022-06-24 – 2022-07-02 (×14): 5 mg via ORAL
  Filled 2022-06-24 (×14): qty 1

## 2022-06-24 MED ORDER — HEPARIN SODIUM (PORCINE) 5000 UNIT/ML IJ SOLN
5000.0000 [IU] | Freq: Two times a day (BID) | INTRAMUSCULAR | Status: DC
  Administered 2022-06-24 – 2022-06-25 (×3): 5000 [IU] via SUBCUTANEOUS
  Filled 2022-06-24 (×3): qty 1

## 2022-06-24 MED ORDER — HYDRALAZINE HCL 50 MG PO TABS
50.0000 mg | ORAL_TABLET | Freq: Two times a day (BID) | ORAL | Status: DC
  Administered 2022-06-24 – 2022-06-28 (×9): 50 mg via ORAL
  Filled 2022-06-24 (×9): qty 1

## 2022-06-24 MED ORDER — ISOSORBIDE MONONITRATE ER 30 MG PO TB24
30.0000 mg | ORAL_TABLET | Freq: Every day | ORAL | Status: DC
  Administered 2022-06-25 – 2022-07-02 (×8): 30 mg via ORAL
  Filled 2022-06-24 (×8): qty 1

## 2022-06-24 MED ORDER — FUROSEMIDE 10 MG/ML IJ SOLN
60.0000 mg | Freq: Once | INTRAMUSCULAR | Status: AC
  Administered 2022-06-24: 60 mg via INTRAVENOUS
  Filled 2022-06-24: qty 6

## 2022-06-24 NOTE — Progress Notes (Addendum)
Nephrology Follow-Up note   Assessment/Recommendations: Amber Cross is a/an 61 y.o. female with a past medical history significant for HFrEF, HTN, DM2, small cell lung cancer, chronic hypoxic respiratory failure, and CKD IV who presents with acute on chronic hypoxic respiratory failure   AKI on CKD 4: Likely secondary to hemodynamic mediated process possibly some degree of cardiorenal syndrome and possibly some ATN.  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  Continue supportive care - allowing time for the kidneys to heal.  Because of her severe underlying CKD, it is possible she will not recover -Continue to monitor urine output closely -Maintain MAP>65 for optimal renal perfusion.  -Currently no indication for HD.  Previously discussed with her outpatient nephrologist who states he has significant hesitancy regarding pursuing dialysis in the setting of her cancer.  Will continue to monitor closely and consider involvement of palliative care   Acute on chronic hypoxic respiratory failure: Continue with volume optimization as desired based on cardiac status.  Supplemental oxygen as needed. Overall improved   Pericardial effusion: Status post pericardial window on 06/18/2022.  Cytology negative for malignancy. Cardiothoracic surgery following.   Hypertension: Acceptable on current regimen    HFrEF: Some signs of volume overload.  Continue to monitor closely.   lasix once today    Metabolic acidosis: likely 2/2 CKD.  Resume oral sodium bicarbonate and lasix once today    Anemia - multifactorial. Iron sat 29. Avoiding ESA given malignancy. Transfusion as needed  Small cell lung cancer: Radiation finished in October.  Per charting she is planning to see oncology in December for restaging.   - It may be beneficial to involve oncology at some point to help understand the status of her  malignancy (pt states she is cancer free but this appears inaccurate), prognosis, expected treatment as this may guide her goals of care  Hyperphosphatemia - on renvela   Disposition - continue inpatient monitoring   ___________________________________________________________  CC: shortness of breath   Interval History/Subjective:  Patient had 375 ml UOP over 11/26.  She is about to get up to walk with the RN.  She states that she has not talked about dialysis with her outpatient nephrologist.  She states that she is usually on lasix at home.  She does wear oxygen at home  Review of systems:  She denies any shortness of breath at rest or exertion  Denies chest pain  Denies n/v  Medications:  Current Facility-Administered Medications  Medication Dose Route Frequency Provider Last Rate Last Admin   atorvastatin (LIPITOR) tablet 80 mg  80 mg Oral Daily Barrett, Erin R, PA-C   80 mg at 06/23/22 0954   bisacodyl (DULCOLAX) EC tablet 10 mg  10 mg Oral Daily Stehler, Mel Almond C, PA-C   10 mg at 06/19/22 0947   carvedilol (COREG) tablet 12.5 mg  12.5 mg Oral BID WC Reesa Chew, MD   12.5 mg at 06/23/22 1652   Chlorhexidine Gluconate Cloth 2 % PADS 6 each  6 each Topical Daily Dahlia Byes, MD   6 each at 06/23/22 0954   gabapentin (NEURONTIN) capsule 100 mg  100 mg Oral QHS Wynelle Beckmann C, PA-C   100 mg at 06/23/22 2121   heparin injection 5,000 Units  5,000 Units Subcutaneous Q8H Gold, Wayne E, PA-C   5,000 Units at 06/23/22 2121   hydrALAZINE (APRESOLINE) tablet 25 mg  25 mg  Oral Q12H Reesa Chew, MD   25 mg at 06/23/22 1804   insulin aspart (novoLOG) injection 0-9 Units  0-9 Units Subcutaneous TID WC & HS Dahlia Byes, MD   1 Units at 06/23/22 2121   isosorbide mononitrate (IMDUR) 24 hr tablet 15 mg  15 mg Oral Daily Reesa Chew, MD   15 mg at 06/23/22 0953   metoCLOPramide (REGLAN) injection 10 mg  10 mg Intravenous Q12H Dahlia Byes, MD   10 mg at 06/23/22 2120    nitroGLYCERIN (NITROSTAT) SL tablet 0.4 mg  0.4 mg Sublingual Q5 min PRN Magdalene River, PA-C       ondansetron [facility]) injection 4 mg  4 mg Intravenous Q6H PRN Dahlia Byes, MD   4 mg at 06/19/22 0945   Oral care mouth rinse  15 mL Mouth Rinse PRN Dahlia Byes, MD       oxyCODONE (Oxy IR/ROXICODONE) immediate release tablet 5-10 mg  5-10 mg Oral Q4H PRN Magdalene River, PA-C   10 mg at 06/23/22 2341   pantoprazole (PROTONIX) EC tablet 40 mg  40 mg Oral Daily Magdalene River, PA-C   40 mg at 06/23/22 0953   promethazine (PHENERGAN) 6.25 mg in sodium chloride 0.9 % 50 mL IVPB  6.25 mg Intravenous Q6H PRN Dahlia Byes, MD       senna-docusate (Senokot-S) tablet 1 tablet  1 tablet Oral QHS Magdalene River, PA-C   1 tablet at 06/23/22 2121   sevelamer carbonate (RENVELA) tablet 800 mg  800 mg Oral TID WC Reesa Chew, MD   800 mg at 06/23/22 1652   traZODone (DESYREL) tablet 50 mg  50 mg Oral QHS PRN Magdalene River, PA-C   50 mg at 06/23/22 2127      Physical Exam: Vitals:   06/24/22 0200 06/24/22 0300  BP: 139/77 (!) 141/71  Pulse: 70 70  Resp: 13 13  Temp:    SpO2: 98% 98%   Total I/O In: -  Out: 225 [Urine:225]  Intake/Output Summary (Last 24 hours) at 06/24/2022 0545 Last data filed at 06/24/2022 0100 Gross per 24 hour  Intake --  Output 420 ml  Net -420 ml   General adult female in bed in no acute distress HEENT normocephalic atraumatic extraocular movements intact sclera anicteric Neck supple trachea midline Lungs clear to auscultation bilaterally normal work of breathing at rest on 2 liters oxygen Heart S1S2 no rub Abdomen soft nontender nondistended Extremities 1+ edema bilateral lower extremities Psych normal mood and affect Neuro oriented to person and conversant.  RN indicates that she is providing incorrect answers to her questions such as if she has walked or weighed this am    Test Results I personally reviewed new and old clinical  labs and radiology tests Lab Results  Component Value Date   NA 138 06/23/2022   K 4.8 06/23/2022   CL 109 06/23/2022   CO2 14 (L) 06/23/2022   BUN 71 (H) 06/23/2022   CREATININE 6.32 (H) 06/23/2022   CALCIUM 7.5 (L) 06/23/2022   ALBUMIN 2.5 (L) 06/23/2022   PHOS 7.2 (H) 06/23/2022    CBC Recent Labs  Lab 06/18/22 0617 06/18/22 0855 06/22/22 0316 06/23/22 0245 06/24/22 0450  WBC 7.0   < > 13.3* 10.7* 9.6  NEUTROABS 5.4  --   --   --   --   HGB 10.0*   < > 8.5* 8.0* 8.6*  HCT 29.5*   < > 27.4* 25.7* 26.1*  MCV 85.0   < > 89.5 88.0 86.7  PLT 266   < > 217 218 260   < > = values in this interval not displayed.    Claudia Desanctis, MD 6:05 AM 06/24/2022   Addendum:   Creatinine continues to rise.  Lasix today as above and would consider hem/onc consult as pt reportedly believes she is cancer-free now and this would help guide her goals of care.   Claudia Desanctis, MD 8:43 AM 06/24/2022

## 2022-06-24 NOTE — Progress Notes (Addendum)
[address]       [employer] [address]             [phone]     6 Days Post-Op Procedure(s) (LRB): SUBXYPHOID PERICARDIAL WINDOW (N/A) TRANSESOPHAGEAL ECHOCARDIOGRAM (TEE) (N/A) CHEST TUBE INSERTION (Right) Subjective: Not significantly SOB  Objective: Vital signs in last 24 hours: Temp:  [98.1 F (36.7 C)-98.8 F (37.1 C)] 98.3 F (36.8 C) (11/27 0742) Pulse Rate:  [68-74] 69 (11/27 0700) Cardiac Rhythm: Normal sinus rhythm (11/27 0400) Resp:  [8-20] 12 (11/27 0700) BP: (130-156)/(59-90) 143/70 (11/27 0700) SpO2:  [96 %-98 %] 98 % (11/27 0700) Weight:  [68.3 kg] 68.3 kg (11/27 0500)  Hemodynamic parameters for last 24 hours:    Intake/Output from previous day: 11/26 0701 - 11/27 0700 In: -  Out: 540 [Urine:540] Intake/Output this shift: Total I/O In: -  Out: 60 [Chest Tube:60]  General appearance: alert, cooperative, and no distress Heart: regular rate and rhythm Lungs: clear to auscultation bilaterally Abdomen: benign Extremities: + BLE edema Wound: dressing CDI  Lab Results: Recent Labs    06/23/22 0245 06/24/22 0450  WBC 10.7* 9.6  HGB 8.0* 8.6*  HCT 25.7* 26.1*  PLT 218 260   BMET:  Recent Labs    06/23/22 1530 06/24/22 0450  NA 138 135  K 4.8 4.4  CL 109 107  CO2 14* 16*  GLUCOSE 130* 102*  BUN 71* 73*  CREATININE 6.32* 6.54*  CALCIUM 7.5* 7.6*    PT/INR: No results for input(s): "LABPROT", "INR" in the last 72 hours. ABG    Component Value Date/Time   PHART 7.240 (L) 06/19/2022 0408   HCO3 19.2 (L) 06/19/2022 0408   TCO2 21 (L) 06/19/2022 0408   ACIDBASEDEF 8.0 (H) 06/19/2022 0408   O2SAT 91 06/19/2022 0408   CBG (last 3)  Recent Labs    06/23/22 2113 06/24/22 0633 06/24/22 0741  GLUCAP 129* 105* 108*    Meds Scheduled Meds:  atorvastatin  80 mg Oral Daily   bisacodyl  10 mg Oral Daily   carvedilol  12.5 mg Oral BID WC   Chlorhexidine Gluconate Cloth  6 each Topical Daily   gabapentin  100  mg Oral QHS   heparin injection (subcutaneous)  5,000 Units Subcutaneous Q8H   hydrALAZINE  25 mg Oral Q12H   insulin aspart  0-9 Units Subcutaneous TID WC & HS   isosorbide mononitrate  15 mg Oral Daily   metoCLOPramide (REGLAN) injection  10 mg Intravenous Q12H   pantoprazole  40 mg Oral Daily   senna-docusate  1 tablet Oral QHS   sevelamer carbonate  800 mg Oral TID WC   sodium bicarbonate  1,300 mg Oral BID   Continuous Infusions:  promethazine (PHENERGAN) injection (IM or IVPB)     PRN Meds:.nitroGLYCERIN, ondansetron (ZOFRAN) IV, mouth rinse, oxyCODONE, promethazine (PHENERGAN) injection (IM or IVPB), traZODone  Xrays No results found.  Assessment/Plan: S/P Procedure(s) (LRB): SUBXYPHOID PERICARDIAL WINDOW (N/A) TRANSESOPHAGEAL ECHOCARDIOGRAM (TEE) (N/A) CHEST TUBE INSERTION (Right)   1 afebrile, s BP 130-156, sinus rhythm 2 O2 sats good on 2 liters 3. PATH/CYTOLOGY  FINAL MICROSCOPIC DIAGNOSIS:   A.   PERICARDIAL, BIOPSY:  -    Pericardial fibrosis.  -    No malignancy identified.  -    No significant inflammation, granulomas or fibrinous exudate  identified.    Clinical History: PERICARDIAL EFFUSION  Specimen Submitted:  A. PLEURAL FLUID, RIGHT, THORACENTESIS:    FINAL MICROSCOPIC DIAGNOSIS:  -  Atypical cells present   She completed radiation therapy for stage 1A small xell w/Dr Tammi Klippel   4 nephrology following renal fxn carefully, BUN/Creat with slow trend higher 5 BS's adeq controlled 6 anemia is stable- not related to blood loss from surgery 7 no leukocytosis 8 routine pulm hygiene and mobilize as able  LOS: 8 days    John Giovanni PA-C Pager [phone] 06/24/2022  Patient examined and recent chest x-ray reviewed.  She is breathing much more comfortably after pericardial window and drainage of right pleural effusion.  Chest x-ray shows minimal basilar atelectasis.  Postoperative echocardiogram shows no significant residual pericardial effusion.   Cytology both pericardial and pleural fluid was negative for malignant cells.  Only inflammatory cells noted.  Pericardial tissue pathology shows no malignancy.  The effusion is probably related to previous radiation therapy causing inflammation and associated renal disease with fluid retention.  The patient's chronic renal disease is stable with creatinine plateaued at 6.5.  Will transfer patient to progressive care to central for further monitoring and care for medical problems.   patient examined and medical record reviewed,agree with above note. Dahlia Byes 06/24/2022

## 2022-06-24 NOTE — Progress Notes (Signed)
06/24/22 1220  Clinical Encounter Type  Visited With Patient and family together  Visit Type Initial  Referral From Nurse Terie Purser, RN)  Consult/Referral To Chaplain Melvenia Beam)  Recommendations Advance Directive Education  Advance Directives (For Healthcare)  Does Patient Have a Medical Advance Directive? No  Would patient like information on creating a medical advance directive? Yes (Inpatient - patient defers creating a medical advance directive at this time - Information given)  [place] Directives  Does Patient Have a Mental Health Advance Directive? No   Chaplain responded to Spiritual Care Consultation requesting assistance for Advance Directive preparation with Ms. Amber Cross. Chaplain met with Amber Cross, and friend: Amber Cross at patient's bedside on 06/24/2022 at 1220.   Amber Cross stated that she does NOT have a court appointed [place]. Amber Cross stated that she is NOT Married.   Amber Cross indicated that she intends to list her FRIEND: Amber Cross, [phone] as her [place].   Amber Cross indicated that she intends to list her SON: Amber Cross, [phone] as her [place].   Chaplain provided the Advance Directive packet as well as education on Advance Directives-documents an individual completes to communicate their health care directions in advance of a time when they may need them. Chaplain informed Amber Cross the documents which may be completed here in the hospital are the Living Will and [place].   Chaplain informed Amber Cross that the [place] is a legal document in which an individual names another person, as their [place], to communicate her health care decisions, when she is not able to communicate them for herself. The Health Care Agent's  function can be temporary or permanent depending on her ability to make and communicate those decisions independently.   Chaplain informed Amber Cross in the absence of a [place], the state of [place] directs health care providers to look to the following individuals in the order listed: legal guardian; an attorney-in-fact under a general power of attorney (POA) if that POA includes the right to make health care decisions; person's spouse; a 22 of her adult children; a 32 of adult brothers and sisters; or an individual who has an established relationship with you, who is acting in good faith and who can convey your wishes.  If none of these persons are available or willing to make medical decisions on a patient's behalf, the law allows the patient's doctor to make decisions for them as long as another doctor agrees with those decisions.  Chaplain also informed the patient that the Health Care agent has no decision-making authority over any affairs other than those related to her medical care.   The chaplain further educated Amber Cross that a Living Will is a legal document that allows her desires not to receive life-prolonging measures in the event that she has a condition that is incurable and will result in her death in a short period of time; they are unconscious, and doctors are confident that she will not regain consciousness; and/or she has advanced dementia or other substantial and irreversible loss of mental function.   The chaplain informed Amber Cross that life-prolonging measures are medical treatments that would only serve to postpone death, including breathing machines, kidney dialysis, antibiotics, artificial nutrition and hydration (tube feeding), and similar  forms of treatment and that if an individual is able to express their wishes, they may also make them known without the use of a Living Will, but in the event that she is not able to express her  wishes, a Living Will allows medical providers and the her family and friends to ensure that they are not making decisions on her behalf, but rather serving as her voice to convey decisions that she has already made.   Amber Cross is aware that the decision to create an advance directive is hers alone and she may choose not to complete the documents or may choose to complete one portion or both.  Amber Cross was informed that she can revoke the documents at any time by striking through them and writing void or by completing new documents, but that it is also advisable that the individual verbally notify interested parties that her wishes have changed.   Amber Cross is also aware that the document must be signed in the presence of a notary public and two witnesses and that this may be done while the patient is still admitted to the hospital or after discharge in the community. If they decide to complete Advance Directives after being discharged from the hospital, they have been advised to notify all interested parties and to provide those documents to their physicians and loved ones in addition to bringing them to the hospital in the event of another hospitalization.   The chaplain informed the Amber Cross that if she desires to proceed with completing the Advance Directive Documentation while she is still admitted, notary services are typically available at [facility] between the hours of 1:00 and 3:30 Monday-Thursday.   When the patient is ready to have these documents completed, the patient should request that their nurse place a spiritual care consult and indicate that the patient is ready to have their advance directives notarized so that arrangements for witnesses and notary public can be made.  If there is an immediate need to have notarized please page the Chaplain.   Please page spiritual care if the patient desires further education or has questions.   [address] [place],  [place] [phone].

## 2022-06-24 NOTE — Plan of Care (Signed)
  Problem: Education: Goal: Knowledge of General Education information will improve Description: Including pain rating scale, medication(s)/side effects and non-pharmacologic comfort measures Outcome: Progressing   Problem: Clinical Measurements: Goal: Will remain free from infection Outcome: Progressing Goal: Diagnostic test results will improve Outcome: Progressing Goal: Respiratory complications will improve Outcome: Progressing Goal: Cardiovascular complication will be avoided Outcome: Progressing   Problem: Activity: Goal: Risk for activity intolerance will decrease Outcome: Progressing   Problem: Nutrition: Goal: Adequate nutrition will be maintained Outcome: Progressing   Problem: Elimination: Goal: Will not experience complications related to urinary retention Outcome: Progressing   Problem: Pain Managment: Goal: General experience of comfort will improve Outcome: Progressing   Problem: Activity: Goal: Risk for activity intolerance will decrease Outcome: Progressing   Problem: Cardiac: Goal: Will achieve and/or maintain hemodynamic stability Outcome: Progressing   Problem: Clinical Measurements: Goal: Postoperative complications will be avoided or minimized Outcome: Progressing   Problem: Pain Management: Goal: Pain level will decrease Outcome: Progressing

## 2022-06-24 NOTE — Progress Notes (Signed)
Cardiology Progress Note  Patient ID: Amber Cross MRN: [id number] DOB: 04-28-1961 Date of Encounter: 06/24/2022  Primary Cardiologist: Berniece Salines, DO  Subjective   Chief Complaint: None.  HPI: Denies chest pain or trouble breathing.  Creatinine continues to rise.  ROS:  All other ROS reviewed and negative. Pertinent positives noted in the HPI.     Inpatient Medications  Scheduled Meds:  atorvastatin  80 mg Oral Daily   bisacodyl  10 mg Oral Daily   carvedilol  12.5 mg Oral BID WC   Chlorhexidine Gluconate Cloth  6 each Topical Daily   gabapentin  100 mg Oral QHS   heparin injection (subcutaneous)  5,000 Units Subcutaneous Q12H   hydrALAZINE  25 mg Oral Q12H   insulin aspart  0-9 Units Subcutaneous TID WC & HS   isosorbide mononitrate  15 mg Oral Daily   metoCLOPramide (REGLAN) injection  10 mg Intravenous Q12H   pantoprazole  40 mg Oral Daily   senna-docusate  1 tablet Oral QHS   sevelamer carbonate  800 mg Oral TID WC   sodium bicarbonate  1,300 mg Oral BID   Continuous Infusions:  promethazine (PHENERGAN) injection (IM or IVPB)     PRN Meds: nitroGLYCERIN, ondansetron (ZOFRAN) IV, mouth rinse, oxyCODONE, promethazine (PHENERGAN) injection (IM or IVPB), traZODone   Vital Signs   Vitals:   06/24/22 0700 06/24/22 0742 06/24/22 0800 06/24/22 0900  BP: (!) 143/70  (!) 143/62 (!) 148/74  Pulse: 69  67 70  Resp: 12  10 13   Temp:  98.3 F (36.8 C)    TempSrc:  Oral    SpO2: 98%  96% 97%  Weight:      Height:        Intake/Output Summary (Last 24 hours) at 06/24/2022 0932 Last data filed at 06/24/2022 0915 Gross per 24 hour  Intake 100 ml  Output 675 ml  Net -575 ml      06/24/2022    5:00 AM 06/23/2022    5:00 AM 06/22/2022    5:00 AM  Last 3 Weights  Weight (lbs) 150 lb 9.2 oz 149 lb 11.1 oz 149 lb 0.5 oz  Weight (kg) 68.3 kg 67.9 kg 67.6 kg      Telemetry  Overnight telemetry shows sinus rhythm in the 70s, which I personally reviewed.   ECG   The most recent ECG shows sinus tachycardia heart rate 109, LVH by voltage, which I personally reviewed.   Physical Exam   Vitals:   06/24/22 0700 06/24/22 0742 06/24/22 0800 06/24/22 0900  BP: (!) 143/70  (!) 143/62 (!) 148/74  Pulse: 69  67 70  Resp: 12  10 13   Temp:  98.3 F (36.8 C)    TempSrc:  Oral    SpO2: 98%  96% 97%  Weight:      Height:        Intake/Output Summary (Last 24 hours) at 06/24/2022 0932 Last data filed at 06/24/2022 0915 Gross per 24 hour  Intake 100 ml  Output 675 ml  Net -575 ml       06/24/2022    5:00 AM 06/23/2022    5:00 AM 06/22/2022    5:00 AM  Last 3 Weights  Weight (lbs) 150 lb 9.2 oz 149 lb 11.1 oz 149 lb 0.5 oz  Weight (kg) 68.3 kg 67.9 kg 67.6 kg    Body mass index is 23.58 kg/m.  General: Well nourished, well developed, in no acute distress Head: Atraumatic, normal size  Eyes:  PEERLA, EOMI  Neck: Supple, no JVD Endocrine: No thryomegaly Cardiac: Normal S1, S2; RRR; no murmurs, rubs, or gallops Lungs: Clear to auscultation bilaterally, no wheezing, rhonchi or rales  Abd: Soft, nontender, no hepatomegaly  Ext: 2+ pitting edema Musculoskeletal: No deformities, BUE and BLE strength normal and equal Skin: Warm and dry, no rashes   Neuro: Alert and oriented to person, place, time, and situation, CNII-XII grossly intact, no focal deficits  Psych: Normal mood and affect   Labs  High Sensitivity Troponin:   Recent Labs  Lab 06/16/22 0955 06/16/22 1240  TROPONINIHS 44* 51*     Cardiac EnzymesNo results for input(s): "TROPONINI" in the last 168 hours. No results for input(s): "TROPIPOC" in the last 168 hours.  Chemistry Recent Labs  Lab 06/22/22 0316 06/22/22 1625 06/23/22 0245 06/23/22 1530 06/24/22 0450  NA 138   < > 135 138 135  K 4.3   < > 4.4 4.8 4.4  CL 109   < > 108 109 107  CO2 16*   < > 15* 14* 16*  GLUCOSE 118*   < > 98 130* 102*  BUN 67*   < > 69* 71* 73*  CREATININE 6.07*   < > 6.24* 6.32* 6.54*  CALCIUM  7.4*   < > 7.1* 7.5* 7.6*  PROT 4.4*  --  4.8*  --  4.8*  ALBUMIN 2.1*   < > 2.4* 2.5* 2.2*  AST 37  --  20  --  14*  ALT 9  --  7  --  5  ALKPHOS 182*  --  181*  --  172*  BILITOT 0.2*  --  0.3  --  0.5  GFRNONAA 7*   < > 7* 7* 7*  ANIONGAP 13   < > 12 15 12    < > = values in this interval not displayed.    Hematology Recent Labs  Lab 06/22/22 0316 06/23/22 0245 06/24/22 0450  WBC 13.3* 10.7* 9.6  RBC 3.06* 2.92* 3.01*  HGB 8.5* 8.0* 8.6*  HCT 27.4* 25.7* 26.1*  MCV 89.5 88.0 86.7  MCH 27.8 27.4 28.6  MCHC 31.0 31.1 33.0  RDW 16.8* 16.5* 16.4*  PLT 217 218 260   BNPNo results for input(s): "BNP", "PROBNP" in the last 168 hours.  DDimer No results for input(s): "DDIMER" in the last 168 hours.   Radiology  No results found.  Cardiac Studies  TTE 06/21/2022  1. Left ventricular ejection fraction, by estimation, is 40 to 45%. Left  ventricular ejection fraction by 3D volume is 47 %. The left ventricle has  mildly decreased function. There is severe concentric left ventricular  hypertrophy. Left ventricular  diastolic parameters are consistent with Grade II diastolic dysfunction  (pseudonormalization).   2. Right ventricular systolic function is moderately reduced. The right  ventricular size is normal. There is mildly elevated pulmonary artery  systolic pressure. The estimated right ventricular systolic pressure is  33.3 mmHg.   3. Large pleural effusion in the left lateral region.   4. The mitral valve is abnormal. Trivial mitral valve regurgitation. No  evidence of mitral stenosis.   5. Tricuspid valve regurgitation is moderate.   6. The aortic valve is tricuspid. Aortic valve regurgitation is not  visualized. No aortic stenosis is present.   7. The inferior vena cava is dilated in size with <50% respiratory  variability, suggesting right atrial pressure of 15 mmHg.   Patient Profile  Amber Cross is a 61 y.o. female with CKD  stage IV, small cell lung cancer  (limited disease in the right upper lobe, status post radiation), systolic heart failure, diabetes who was admitted on 06/16/2022 with hypertensive crisis.  She was found to have a large pericardial effusion with concerns for pericardial tamponade.  She is status post pericardial window on 06/18/2022.  Course has been complicated by acute kidney injury.  Assessment & Plan   #Pericardial tamponade -Status post pericardial window.  Doing well.  Blood pressure stable.  No signs of recurrence. -Cytology negative.  Cultures negative.  I suspect this could be related to her chronic renal insufficiency. -No chest pain.  No concerns for pericarditis as an explanation.  We will continue supportive care.  #Acute on chronic systolic heart failure, EF 40-45% #HTN -Volume up on exam.  Worsening kidney function.  Diuresis per nephrology. -May end up needing dialysis but reluctant to do this in the setting of cancer. -Continue carvedilol 12.5 mg twice daily.  Increase Imdur to 30 mg daily.  Increase hydralazine to 50 mg 3 times daily. -Not a candidate for ACE/ARB/ARNI/MRA given advanced kidney disease.  #Stage Ia small cell lung cancer -Limited disease defined to the right upper lobe.  Would recommend to involve oncology regarding her prognosis.  This seems to be the hold-up for initiation of dialysis.   Mosinee will sign off.   Medication Recommendations: Medical management for cardiomyopathy as above.  Diuresis per nephrology. Other recommendations (labs, testing, etc): None. Follow up as an outpatient: She may keep regular outpatient follow-up with her cardiologist.  Please notify cardiology when she is closer to discharge and we can make sure this is arranged.  For questions or updates, please contact [place] Please consult [email or web address] for contact info under   Signed, [place] T. Audie Box, MD, [place]  06/24/2022 9:32 AM

## 2022-06-24 NOTE — Progress Notes (Signed)
Thanks, Dr. Tammi Klippel. Copying the current hospital team on the message.

## 2022-06-25 ENCOUNTER — Inpatient Hospital Stay (HOSPITAL_COMMUNITY): Payer: Commercial Managed Care - HMO

## 2022-06-25 LAB — RENAL FUNCTION PANEL
Albumin: 2.2 g/dL — ABNORMAL LOW (ref 3.5–5.0)
Anion gap: 10 (ref 5–15)
BUN: 72 mg/dL — ABNORMAL HIGH (ref 6–20)
CO2: 17 mmol/L — ABNORMAL LOW (ref 22–32)
Calcium: 7.9 mg/dL — ABNORMAL LOW (ref 8.9–10.3)
Chloride: 110 mmol/L (ref 98–111)
Creatinine, Ser: 6.71 mg/dL — ABNORMAL HIGH (ref 0.44–1.00)
GFR, Estimated: 7 mL/min — ABNORMAL LOW (ref >60)
Glucose, Bld: 145 mg/dL — ABNORMAL HIGH (ref 70–99)
Phosphorus: 7.3 mg/dL — ABNORMAL HIGH (ref 2.5–4.6)
Potassium: 4.5 mmol/L (ref 3.5–5.1)
Sodium: 137 mmol/L (ref 135–145)

## 2022-06-25 LAB — CBC
HCT: 27.8 % — ABNORMAL LOW (ref 36.0–46.0)
Hemoglobin: 9 g/dL — ABNORMAL LOW (ref 12.0–15.0)
MCH: 28.5 pg (ref 26.0–34.0)
MCHC: 32.4 g/dL (ref 30.0–36.0)
MCV: 88 fL (ref 80.0–100.0)
Platelets: 283 10*3/uL (ref 150–400)
RBC: 3.16 MIL/uL — ABNORMAL LOW (ref 3.87–5.11)
RDW: 16.4 % — ABNORMAL HIGH (ref 11.5–15.5)
WBC: 8.9 10*3/uL (ref 4.0–10.5)
nRBC: 0 % (ref 0.0–0.2)

## 2022-06-25 LAB — GLUCOSE, CAPILLARY
Glucose-Capillary: 130 mg/dL — ABNORMAL HIGH (ref 70–99)
Glucose-Capillary: 171 mg/dL — ABNORMAL HIGH (ref 70–99)
Glucose-Capillary: 123 mg/dL — ABNORMAL HIGH (ref 70–99)
Glucose-Capillary: 121 mg/dL — ABNORMAL HIGH (ref 70–99)

## 2022-06-25 MED ORDER — ALBUMIN HUMAN 25 % IV SOLN
25.0000 g | Freq: Once | INTRAVENOUS | Status: AC
  Administered 2022-06-25: 25 g via INTRAVENOUS
  Filled 2022-06-25: qty 100

## 2022-06-25 MED ORDER — FUROSEMIDE 10 MG/ML IJ SOLN
80.0000 mg | Freq: Once | INTRAMUSCULAR | Status: AC
  Administered 2022-06-25: 80 mg via INTRAVENOUS
  Filled 2022-06-25: qty 8

## 2022-06-25 MED ORDER — SEVELAMER CARBONATE 800 MG PO TABS
1600.0000 mg | ORAL_TABLET | Freq: Three times a day (TID) | ORAL | Status: DC
  Administered 2022-06-25 – 2022-06-29 (×10): 1600 mg via ORAL
  Filled 2022-06-25 (×9): qty 2

## 2022-06-25 NOTE — Progress Notes (Signed)
   06/25/22 1500  Clinical Encounter Type  Visited With Health care provider;Patient and family together  Visit Type Follow-up  Referral From Nurse  Consult/Referral To Chaplain Melvenia Beam)  Recommendations Facilitate Notary   Chaplain Reviewed Advance Care Directive and Attempted to facilitate for Notary. Notary Not Available. [address] [place], [place] [phone]

## 2022-06-25 NOTE — Plan of Care (Signed)
  Problem: Education: Goal: Knowledge of General Education information will improve Description: Including pain rating scale, medication(s)/side effects and non-pharmacologic comfort measures Outcome: Progressing   Problem: Health Behavior/Discharge Planning: Goal: Ability to manage health-related needs will improve Outcome: Progressing   Problem: Clinical Measurements: Goal: Ability to maintain clinical measurements within normal limits will improve Outcome: Progressing Goal: Will remain free from infection Outcome: Progressing Goal: Diagnostic test results will improve Outcome: Progressing Goal: Respiratory complications will improve Outcome: Progressing Goal: Cardiovascular complication will be avoided Outcome: Progressing   Problem: Activity: Goal: Risk for activity intolerance will decrease Outcome: Progressing   Problem: Nutrition: Goal: Adequate nutrition will be maintained Outcome: Progressing   Problem: Coping: Goal: Level of anxiety will decrease Outcome: Progressing   Problem: Elimination: Goal: Will not experience complications related to bowel motility Outcome: Progressing Goal: Will not experience complications related to urinary retention Outcome: Progressing   Problem: Pain Managment: Goal: General experience of comfort will improve Outcome: Progressing   Problem: Safety: Goal: Ability to remain free from injury will improve Outcome: Progressing   Problem: Skin Integrity: Goal: Risk for impaired skin integrity will decrease Outcome: Progressing   Problem: Education: Goal: Ability to describe self-care measures that may prevent or decrease complications (Diabetes Survival Skills Education) will improve Outcome: Progressing Goal: Individualized Educational Video(s) Outcome: Progressing   Problem: Coping: Goal: Ability to adjust to condition or change in health will improve Outcome: Progressing   Problem: Health Behavior/Discharge  Planning: Goal: Ability to identify and utilize available resources and services will improve Outcome: Progressing Goal: Ability to manage health-related needs will improve Outcome: Progressing   Problem: Fluid Volume: Goal: Ability to maintain a balanced intake and output will improve Outcome: Progressing   Problem: Metabolic: Goal: Ability to maintain appropriate glucose levels will improve Outcome: Progressing   Problem: Nutritional: Goal: Maintenance of adequate nutrition will improve Outcome: Progressing Goal: Progress toward achieving an optimal weight will improve Outcome: Progressing   Problem: Skin Integrity: Goal: Risk for impaired skin integrity will decrease Outcome: Progressing   Problem: Tissue Perfusion: Goal: Adequacy of tissue perfusion will improve Outcome: Progressing   Problem: Education: Goal: Knowledge of disease or condition will improve Outcome: Progressing Goal: Knowledge of the prescribed therapeutic regimen will improve Outcome: Progressing   Problem: Activity: Goal: Risk for activity intolerance will decrease Outcome: Progressing

## 2022-06-25 NOTE — Progress Notes (Signed)
Mobility Specialist Progress Note   06/25/22 1515  Mobility  Activity Ambulated with assistance in hallway  Level of Assistance Contact guard assist, steadying assist  Assistive Device Front wheel walker  Distance Ambulated (ft) 140 ft  Range of Motion/Exercises Active;All extremities  Activity Response Tolerated well   Pre Mobility:  HR 70, BP 153/77,  SpO2 93% RA During Mobility: HR 78, SpO2 87% RA; 92 % 2LO2 Post Mobility: HR 69, SpO2 94% 2LO2  Patient received in supine and agreeable to participate. Was independent with bed mobility and stood with min A + cues for hand placement. Ambulated min guard with steady gait. Oxygen desaturated to 87% while on room air during ambulation and quickly came up to 92% on 2LO2. Was asymptomatic throughout but c/o nausea related to "loud" smelling perfume in hallway. Returned to room without incident. Was left dangling EOB with all needs met, call bell in reach.   [place] Vedanth Sirico, BS EXP Mobility Specialist Please contact via SecureChat or Rehab office at [phone]

## 2022-06-25 NOTE — Progress Notes (Addendum)
[address]       [employer] [address]             [phone]     7 Days Post-Op Procedure(s) (LRB): SUBXYPHOID PERICARDIAL WINDOW (N/A) TRANSESOPHAGEAL ECHOCARDIOGRAM (TEE) (N/A) CHEST TUBE INSERTION (Right) Subjective: Feels ok , no specific c/o  Objective: Vital signs in last 24 hours: Temp:  [97.6 F (36.4 C)-98.3 F (36.8 C)] 97.8 F (36.6 C) (11/28 0300) Pulse Rate:  [65-73] 73 (11/28 0300) Cardiac Rhythm: Normal sinus rhythm (11/28 0711) Resp:  [10-20] 13 (11/28 0300) BP: (121-171)/(56-82) 135/63 (11/28 0500) SpO2:  [91 %-97 %] 94 % (11/28 0300) Weight:  [69.2 kg] 69.2 kg (11/28 0655)  Hemodynamic parameters for last 24 hours:    Intake/Output from previous day: 11/27 0701 - 11/28 0700 In: 350 [P.O.:350] Out: 645 [Urine:645] Intake/Output this shift: No intake/output data recorded.  Alert, NAD Lungs clear Cardiac: RRR Ext+ BLE edema Incis healing well  Lab Results: Recent Labs    06/24/22 0450 06/25/22 0012  WBC 9.6 8.9  HGB 8.6* 9.0*  HCT 26.1* 27.8*  PLT 260 283   BMET:  Recent Labs    06/24/22 1524 06/25/22 0012  NA 138 137  K 4.7 4.5  CL 110 110  CO2 16* 17*  GLUCOSE 120* 145*  BUN 71* 72*  CREATININE 6.67* 6.71*  CALCIUM 7.8* 7.9*    PT/INR: No results for input(s): "LABPROT", "INR" in the last 72 hours. ABG    Component Value Date/Time   PHART 7.240 (L) 06/19/2022 0408   HCO3 19.2 (L) 06/19/2022 0408   TCO2 21 (L) 06/19/2022 0408   ACIDBASEDEF 8.0 (H) 06/19/2022 0408   O2SAT 91 06/19/2022 0408   CBG (last 3)  Recent Labs    06/24/22 1530 06/24/22 1921 06/25/22 0641  GLUCAP 126* 135* 130*    Meds Scheduled Meds:  atorvastatin  80 mg Oral Daily   bisacodyl  10 mg Oral Daily   carvedilol  12.5 mg Oral BID WC   Chlorhexidine Gluconate Cloth  6 each Topical Daily   furosemide  80 mg Intravenous Once   gabapentin  100 mg Oral QHS   heparin injection (subcutaneous)  5,000 Units Subcutaneous Q12H    hydrALAZINE  50 mg Oral Q12H   insulin aspart  0-9 Units Subcutaneous TID WC & HS   isosorbide mononitrate  30 mg Oral Daily   metoCLOPramide (REGLAN) injection  10 mg Intravenous Q12H   pantoprazole  40 mg Oral Daily   senna-docusate  1 tablet Oral QHS   sevelamer carbonate  1,600 mg Oral TID WC   sodium bicarbonate  1,300 mg Oral BID   Continuous Infusions:  albumin human     promethazine (PHENERGAN) injection (IM or IVPB)     PRN Meds:.nitroGLYCERIN, ondansetron (ZOFRAN) IV, mouth rinse, oxyCODONE, promethazine (PHENERGAN) injection (IM or IVPB), traZODone  Xrays No results found.  Assessment/Plan: S/P Procedure(s) (LRB): SUBXYPHOID PERICARDIAL WINDOW (N/A) TRANSESOPHAGEAL ECHOCARDIOGRAM (TEE) (N/A) CHEST TUBE INSERTION (Right)  1 afeb, s BP 120's-170's, sinus rhythm 2 O2 sats good on 2 liters 3 fair UOP 4 renal fxn pretty stable with slow increase in BUN/Creat- nephrology conts to monitor and treat, poss dialysis on 11/30 5 cont routine pulm hygiene and rehab- ? Tx back to medicine    LOS: 9 days    Gaspar Bidding Pager [phone] 06/25/2022  CXR shows gradual re-accumulation  of R pleural effusion related to acute on chronic renal  failure and fluid retention. Will check CXR tomorrow to assess benefit of IR /CT guided R chest tube as drain from OR has been pulled out.  patient examined and medical record reviewed,agree with above note. Amber Cross 06/25/2022

## 2022-06-25 NOTE — Progress Notes (Signed)
Nephrology Follow-Up note   Assessment/Recommendations: Amber Cross is a/an 61 y.o. female with a past medical history significant for HFrEF, HTN, DM2, small cell lung cancer, chronic hypoxic respiratory failure, and CKD IV who presents with acute on chronic hypoxic respiratory failure   # AKI on CKD 4: Likely secondary to hemodynamic mediated process possibly some degree of cardiorenal syndrome and possibly some ATN.  Significant CKD related to diabetes (biopsy proven) with severe interstitial fibrosis and tubular atrophy  --------------------- - She has no emergent indication for dialysis however she is making minimal urine and labs have continued to worsen over time.  Her significant scarring on renal biopsy as above does mean that she is less likely to recover - Spoke with patient and her sister Amber Cross as above.  Plan for starting dialysis on 11/30 if no improvement by that time.  - Lasix 80 mg IV once and albumin 25 gram 25% once -Maintain MAP>65 for optimal renal perfusion.    # Acute on chronic hypoxic respiratory failure: Continue with volume optimization as desired based on cardiac status.  Supplemental oxygen as needed. Overall improved   Pericardial effusion: Status post pericardial window on 06/18/2022.  Cytology negative for malignancy. Cardiothoracic surgery following.   CKD stage IV  - note her baseline Cr is 3-3.3 per charting and with severe interstitial fibrosis and tubular atrophy on past biopsy  Hypertension: Acceptable on current regimen    HFrEF:  has had minimal urine output and no response to lasix IV.  Continue to monitor closely.  Volume as above   Metabolic acidosis: likely 2/2 CKD.  Resumed oral sodium bicarbonate   Anemia - multifactorial. Iron sat 29. Avoiding ESA given malignancy. Transfusion as needed  Small cell lung cancer: Radiation finished in October.  Per charting she is planning to see oncology in December for restaging.  Would consider reaching  out to oncology    Hyperphosphatemia - increase renvela   Disposition - continue inpatient monitoring   ___________________________________________________________  CC: shortness of breath   Interval History/Subjective:  She was moved to [place] in the interim.  She had 320 mL uop over 11/27 charted. She got lasix 60 mg IV on 11/27.  I spoke with the patient and her sister via phone this am.  We discussed the risks/benefits/indications for dialysis and she would want dialysis if it were needed.  We discussed no emergent labs however renal function significantly impaired and not getting any better/trending worse slowly.  We set a date for 11/30 for starting dialysis if no significant improvement by that time.  All questions answered.   Her son did not answer and she states that her sister Amber Cross is really who she would turn to for help with medical decisions.  They talk every day.  She does wear oxygen at home.  Patient is conversant and more awake this am.    Review of systems:    She denies any shortness of breath at rest or exertion  Denies chest pain  Denies n/v  Medications:  Current Facility-Administered Medications  Medication Dose Route Frequency Provider Last Rate Last Admin   atorvastatin (LIPITOR) tablet 80 mg  80 mg Oral Daily Barrett, Erin R, PA-C   80 mg at 06/24/22 0917   bisacodyl (DULCOLAX) EC tablet 10 mg  10 mg Oral Daily Stehler, Mel Almond C, PA-C   10 mg at 06/24/22 0917   carvedilol (COREG) tablet 12.5 mg  12.5 mg Oral BID WC Reesa Chew, MD   12.5  mg at 06/24/22 1613   Chlorhexidine Gluconate Cloth 2 % PADS 6 each  6 each Topical Daily Dahlia Byes, MD   6 each at 06/24/22 1000   gabapentin (NEURONTIN) capsule 100 mg  100 mg Oral QHS Stehler, Pricilla Larsson, PA-C   100 mg at 06/24/22 2144   heparin injection 5,000 Units  5,000 Units Subcutaneous Q12H Dahlia Byes, MD   5,000 Units at 06/24/22 2145   hydrALAZINE (APRESOLINE) tablet 50 mg  50 mg Oral Q12H Geralynn Rile, MD   50 mg at 06/24/22 1823   insulin aspart (novoLOG) injection 0-9 Units  0-9 Units Subcutaneous TID WC & HS Dahlia Byes, MD   1 Units at 06/24/22 2200   isosorbide mononitrate (IMDUR) 24 hr tablet 30 mg  30 mg Oral Daily O'Neal, Cassie Freer, MD       metoCLOPramide (REGLAN) injection 10 mg  10 mg Intravenous Virl Son, MD   10 mg at 06/24/22 2144   nitroGLYCERIN (NITROSTAT) SL tablet 0.4 mg  0.4 mg Sublingual Q5 min PRN Magdalene River, PA-C       ondansetron [facility]) injection 4 mg  4 mg Intravenous Q6H PRN Dahlia Byes, MD   4 mg at 06/19/22 0945   Oral care mouth rinse  15 mL Mouth Rinse PRN Dahlia Byes, MD       oxyCODONE (Oxy IR/ROXICODONE) immediate release tablet 5 mg  5 mg Oral Q4H PRN Dahlia Byes, MD   5 mg at 06/25/22 0341   pantoprazole (PROTONIX) EC tablet 40 mg  40 mg Oral Daily Magdalene River, PA-C   40 mg at 06/24/22 0917   promethazine (PHENERGAN) 6.25 mg in sodium chloride 0.9 % 50 mL IVPB  6.25 mg Intravenous Q6H PRN Dahlia Byes, MD       senna-docusate (Senokot-S) tablet 1 tablet  1 tablet Oral QHS Wynelle Beckmann C, PA-C   1 tablet at 06/24/22 2144   sevelamer carbonate (RENVELA) tablet 800 mg  800 mg Oral TID WC Reesa Chew, MD   800 mg at 06/24/22 1613   sodium bicarbonate tablet 1,300 mg  1,300 mg Oral BID Claudia Desanctis, MD   1,300 mg at 06/24/22 2144   traZODone (DESYREL) tablet 50 mg  50 mg Oral QHS PRN Wynelle Beckmann C, PA-C   50 mg at 06/24/22 2147      Physical Exam: Vitals:   06/25/22 0300 06/25/22 0500  BP: (!) 162/80 135/63  Pulse: 73   Resp: 13   Temp: 97.8 F (36.6 C)   SpO2: 94%    No intake/output data recorded.  Intake/Output Summary (Last 24 hours) at 06/25/2022 3557 Last data filed at 06/24/2022 1600 Gross per 24 hour  Intake 350 ml  Output 320 ml  Net 30 ml   General adult female in bed in no acute distress   HEENT normocephalic atraumatic extraocular movements intact sclera  anicteric Neck supple trachea midline Lungs clear but reduced to auscultation bilaterally normal work of breathing at rest Heart S1S2  Abdomen soft nontender nondistended Extremities 2+ edema bilateral lower extremities Psych normal mood and affect Neuro oriented to person, location, and time and is conversant. More awake today   Test Results I personally reviewed new and old clinical labs and radiology tests Lab Results  Component Value Date   NA 137 06/25/2022   K 4.5 06/25/2022   CL 110 06/25/2022   CO2 17 (L) 06/25/2022   BUN 72 (H) 06/25/2022  CREATININE 6.71 (H) 06/25/2022   CALCIUM 7.9 (L) 06/25/2022   ALBUMIN 2.2 (L) 06/25/2022   PHOS 7.3 (H) 06/25/2022    CBC Recent Labs  Lab 06/18/22 0617 06/18/22 0855 06/23/22 0245 06/24/22 0450 06/25/22 0012  WBC 7.0   < > 10.7* 9.6 8.9  NEUTROABS 5.4  --   --   --   --   HGB 10.0*   < > 8.0* 8.6* 9.0*  HCT 29.5*   < > 25.7* 26.1* 27.8*  MCV 85.0   < > 88.0 86.7 88.0  PLT 266   < > 218 260 283   < > = values in this interval not displayed.    Claudia Desanctis, MD 6:07 AM 06/25/2022

## 2022-06-26 ENCOUNTER — Telehealth: Payer: Self-pay | Admitting: Internal Medicine

## 2022-06-26 ENCOUNTER — Encounter (HOSPITAL_COMMUNITY): Payer: Self-pay | Admitting: Cardiothoracic Surgery

## 2022-06-26 ENCOUNTER — Inpatient Hospital Stay (HOSPITAL_COMMUNITY): Payer: Commercial Managed Care - HMO

## 2022-06-26 HISTORY — PX: IR US GUIDE VASC ACCESS RIGHT: IMG2390

## 2022-06-26 HISTORY — PX: IR FLUORO GUIDE CV LINE RIGHT: IMG2283

## 2022-06-26 LAB — GLUCOSE, CAPILLARY
Glucose-Capillary: 120 mg/dL — ABNORMAL HIGH (ref 70–99)
Glucose-Capillary: 169 mg/dL — ABNORMAL HIGH (ref 70–99)
Glucose-Capillary: 129 mg/dL — ABNORMAL HIGH (ref 70–99)
Glucose-Capillary: 208 mg/dL — ABNORMAL HIGH (ref 70–99)

## 2022-06-26 LAB — CBC
HCT: 26.3 % — ABNORMAL LOW (ref 36.0–46.0)
Hemoglobin: 8.2 g/dL — ABNORMAL LOW (ref 12.0–15.0)
MCH: 27.7 pg (ref 26.0–34.0)
MCHC: 31.2 g/dL (ref 30.0–36.0)
MCV: 88.9 fL (ref 80.0–100.0)
Platelets: 313 10*3/uL (ref 150–400)
RBC: 2.96 MIL/uL — ABNORMAL LOW (ref 3.87–5.11)
RDW: 16.1 % — ABNORMAL HIGH (ref 11.5–15.5)
WBC: 7.1 10*3/uL (ref 4.0–10.5)
nRBC: 0 % (ref 0.0–0.2)

## 2022-06-26 LAB — ACID FAST SMEAR (AFB, MYCOBACTERIA): Acid Fast Smear: NEGATIVE

## 2022-06-26 LAB — RENAL FUNCTION PANEL
Albumin: 2.2 g/dL — ABNORMAL LOW (ref 3.5–5.0)
Anion gap: 9 (ref 5–15)
BUN: 73 mg/dL — ABNORMAL HIGH (ref 6–20)
CO2: 19 mmol/L — ABNORMAL LOW (ref 22–32)
Calcium: 7.8 mg/dL — ABNORMAL LOW (ref 8.9–10.3)
Chloride: 111 mmol/L (ref 98–111)
Creatinine, Ser: 6.59 mg/dL — ABNORMAL HIGH (ref 0.44–1.00)
GFR, Estimated: 7 mL/min — ABNORMAL LOW (ref >60)
Glucose, Bld: 125 mg/dL — ABNORMAL HIGH (ref 70–99)
Phosphorus: 7.1 mg/dL — ABNORMAL HIGH (ref 2.5–4.6)
Potassium: 4.5 mmol/L (ref 3.5–5.1)
Sodium: 139 mmol/L (ref 135–145)

## 2022-06-26 LAB — FUNGUS CULTURE WITH STAIN

## 2022-06-26 LAB — HEPATITIS B SURFACE ANTIGEN: Hepatitis B Surface Ag: NONREACTIVE

## 2022-06-26 LAB — ACID FAST CULTURE WITH REFLEXED SENSITIVITIES (MYCOBACTERIA)

## 2022-06-26 MED ORDER — MIDAZOLAM HCL 2 MG/2ML IJ SOLN
INTRAMUSCULAR | Status: AC | PRN
  Administered 2022-06-26: .5 mg via INTRAVENOUS

## 2022-06-26 MED ORDER — MIDAZOLAM HCL 2 MG/2ML IJ SOLN
INTRAMUSCULAR | Status: AC | PRN
  Administered 2022-06-26: 1 mg via INTRAVENOUS

## 2022-06-26 MED ORDER — FENTANYL CITRATE (PF) 100 MCG/2ML IJ SOLN
INTRAMUSCULAR | Status: AC | PRN
  Administered 2022-06-26: 25 ug via INTRAVENOUS

## 2022-06-26 MED ORDER — HEPARIN SODIUM (PORCINE) 1000 UNIT/ML IJ SOLN
INTRAMUSCULAR | Status: AC
  Filled 2022-06-26: qty 10

## 2022-06-26 MED ORDER — FENTANYL CITRATE (PF) 100 MCG/2ML IJ SOLN
INTRAMUSCULAR | Status: AC | PRN
  Administered 2022-06-26: 50 ug via INTRAVENOUS

## 2022-06-26 MED ORDER — LIDOCAINE HCL 1 % IJ SOLN
INTRAMUSCULAR | Status: AC
  Administered 2022-06-26: 8 mL
  Filled 2022-06-26: qty 20

## 2022-06-26 MED ORDER — VANCOMYCIN HCL IN DEXTROSE 1-5 GM/200ML-% IV SOLN
INTRAVENOUS | Status: AC
  Administered 2022-06-26: 1000 mg via INTRAVENOUS
  Filled 2022-06-26: qty 200

## 2022-06-26 MED ORDER — HEPARIN SODIUM (PORCINE) 5000 UNIT/ML IJ SOLN
5000.0000 [IU] | Freq: Two times a day (BID) | INTRAMUSCULAR | Status: DC
  Administered 2022-06-27 – 2022-07-02 (×10): 5000 [IU] via SUBCUTANEOUS
  Filled 2022-06-26 (×11): qty 1

## 2022-06-26 MED ORDER — CHLORHEXIDINE GLUCONATE CLOTH 2 % EX PADS
6.0000 | MEDICATED_PAD | Freq: Every day | CUTANEOUS | Status: DC
  Administered 2022-06-27 – 2022-06-28 (×2): 6 via TOPICAL

## 2022-06-26 MED ORDER — FENTANYL CITRATE (PF) 100 MCG/2ML IJ SOLN
INTRAMUSCULAR | Status: AC
  Filled 2022-06-26: qty 4

## 2022-06-26 MED ORDER — MIDAZOLAM HCL 2 MG/2ML IJ SOLN
INTRAMUSCULAR | Status: AC
  Filled 2022-06-26: qty 4

## 2022-06-26 MED ORDER — LIDOCAINE HCL 1 % IJ SOLN
10.0000 mL | Freq: Once | INTRAMUSCULAR | Status: DC
  Filled 2022-06-26: qty 10

## 2022-06-26 MED ORDER — VANCOMYCIN HCL IN DEXTROSE 1-5 GM/200ML-% IV SOLN
1000.0000 mg | Freq: Once | INTRAVENOUS | Status: AC
  Filled 2022-06-26: qty 200

## 2022-06-26 NOTE — Progress Notes (Signed)
PT Cancellation Note  Patient Details Name: Amber Cross MRN: [id number] DOB: July 03, 1961   Cancelled Treatment:    Reason Eval/Treat Not Completed: Patient at procedure or test/unavailable - per RN, gone for HD cath placement then for pigtail catheter placement. Will follow-up tomorrow for PT Evaluation.  Mabeline Caras, PT, DPT Acute Rehabilitation Services  Personal: [place] [facility] Office: [place] 06/26/2022, 3:46 PM

## 2022-06-26 NOTE — Plan of Care (Signed)
  Problem: Clinical Measurements: Goal: Ability to maintain clinical measurements within normal limits will improve Outcome: Progressing Goal: Will remain free from infection Outcome: Progressing Goal: Respiratory complications will improve Outcome: Progressing Goal: Cardiovascular complication will be avoided Outcome: Progressing   

## 2022-06-26 NOTE — Consult Note (Signed)
Chief Complaint: Recurrent Right pleural effusion CKD 4, now needing dialysis  Referring Physician(s): Dahlia Byes Harrie Jeans  Supervising Physician: Aletta Edouard  Patient Status: [facility] - In-pt  History of Present Illness: Amber Cross is a 61 y.o. female with medical issues including heart failure, type 2 diabetes, small cell lung cancer, and CKD 4.  She presented with acute on chronic hypoxic respiratory failure secondary to a pericardial effusion with cardiac tamponade.    She underwent subxiphoid pericardiocentesis on 11/21 which collected about 5 cc of clear fluid.    Cytology of the pleural and pericardial fluid were negative for malignancy.    She had a right sided chest tube placed during the procedure but has been removed.   She has a recurrent right pleural effusion.  Her renal failure has also progressively worsened and Dr. Royce Macadamia feels her renal function in not likely to recover. We are asked to place a tunneled hemodialysis catheter.  She is NPO. No nausea/vomiting. No Fever/chills. ROS negative.   Past Medical History:  Diagnosis Date   Anemia    Arthritis    CHF (congestive heart failure) ([place])    Chronic kidney disease    Depression    Diabetes mellitus    Headache    Hypercholesteremia    Hypertension    Pseudogout    Stroke ([place]) 10/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   Tobacco abuse     Past Surgical History:  Procedure Laterality Date   ANTERIOR CRUCIATE LIGAMENT REPAIR Right    BRONCHIAL BIOPSY  12/18/2021   Procedure: BRONCHIAL BIOPSIES;  Surgeon: Garner Nash, DO;  Location: [place] [facility];  Service: Pulmonary;;   BRONCHIAL BRUSHINGS  12/18/2021   Procedure: BRONCHIAL BRUSHINGS;  Surgeon: Garner Nash, DO;  Location: [place];  Service: Pulmonary;;   BRONCHIAL NEEDLE ASPIRATION BIOPSY  12/18/2021   Procedure: BRONCHIAL NEEDLE ASPIRATION BIOPSIES;  Surgeon: Garner Nash, DO;  Location: [place];  Service: Pulmonary;;    CATARACT EXTRACTION     CHEST TUBE INSERTION Right 06/18/2022   Procedure: CHEST TUBE INSERTION;  Surgeon: Dahlia Byes, MD;  Location: [place];  Service: Thoracic;  Laterality: Right;   FIDUCIAL MARKER PLACEMENT  12/18/2021   Procedure: FIDUCIAL MARKER PLACEMENT;  Surgeon: Garner Nash, DO;  Location: [place] [facility];  Service: Pulmonary;;   OVARY SURGERY     RIGHT OOPHORECTOMY Right    SMALL INTESTINE SURGERY     SUBXYPHOID PERICARDIAL WINDOW N/A 06/18/2022   Procedure: SUBXYPHOID PERICARDIAL WINDOW;  Surgeon: Dahlia Byes, MD;  Location: [place];  Service: Thoracic;  Laterality: N/A;   TEE WITHOUT CARDIOVERSION N/A 06/18/2022   Procedure: TRANSESOPHAGEAL ECHOCARDIOGRAM (TEE);  Surgeon: Dahlia Byes, MD;  Location: [place];  Service: Thoracic;  Laterality: N/A;   TUBAL LIGATION     VIDEO BRONCHOSCOPY WITH RADIAL ENDOBRONCHIAL ULTRASOUND  12/18/2021   Procedure: RADIAL ENDOBRONCHIAL ULTRASOUND;  Surgeon: Garner Nash, DO;  Location: [place];  Service: Pulmonary;;    Allergies: Penicillins, Strawberry (diagnostic), and Tomato  Medications: Prior to Admission medications   Medication Sig Start Date End Date Taking? Authorizing Provider  acetaminophen (TYLENOL) 500 MG tablet Take 1,000 mg by mouth every 6 (six) hours as needed for mild pain.   Yes [provider]  albuterol (VENTOLIN HFA) 108 (90 Base) MCG/ACT inhaler Inhale 2 puffs into the lungs every 6 (six) hours as needed for wheezing or shortness of breath. 02/28/22  Yes Farrel Gordon, DO  amLODipine (Newberry) 10  MG tablet Take 1 tablet (10 mg total) by mouth daily. 02/04/22  Yes Lacinda Axon, MD  aspirin EC 81 MG tablet Take 1 tablet (81 mg total) by mouth daily. Swallow whole. 02/28/22  Yes Farrel Gordon, DO  carvedilol (COREG) 25 MG tablet Take 1 tablet (25 mg total) by mouth 2 (two) times daily with a meal. Patient taking differently: Take 50 mg by mouth 2 (two) times daily with a meal. 03/18/22 06/16/22 Yes  Pahwani, Einar Grad, MD  gabapentin (NEURONTIN) 300 MG capsule Take 1 capsule (300 mg total) by mouth daily. Patient taking differently: Take 300 mg by mouth at bedtime. 11/23/21 06/16/22 Yes Rosezetta Schlatter, MD  hydrALAZINE (APRESOLINE) 50 MG tablet Take 1 tablet (50 mg total) by mouth every 8 (eight) hours. Patient taking differently: Take 100 mg by mouth every 8 (eight) hours. 02/03/22  Yes Amponsah, Charisse March, MD  insulin isophane & regular human KwikPen (NOVOLIN 70/30 KWIKPEN) (70-30) 100 UNIT/ML KwikPen Inject 5 Units into the skin 2 (two) times daily.   Yes [provider]  isosorbide mononitrate (IMDUR) 30 MG 24 hr tablet Take 1 tablet (30 mg total) by mouth daily. 03/19/22 06/16/22 Yes Pahwani, Einar Grad, MD  nitroGLYCERIN (NITROSTAT) 0.4 MG SL tablet Place 0.4 mg under the tongue every 5 (five) minutes as needed for chest pain. 10/01/21  Yes [provider]  torsemide (DEMADEX) 20 MG tablet Take 40 mg by mouth 2 (two) times daily. 02/28/22  Yes [provider]  traZODone (DESYREL) 50 MG tablet Take 50 mg by mouth at bedtime. 03/30/22  Yes [provider]  Vitamin D, Ergocalciferol, (DRISDOL) 1.25 MG (50000 UNIT) CAPS capsule Take 1 capsule (50,000 Units total) by mouth every 7 (seven) days. 02/07/22  Yes Lacinda Axon, MD  Continuous Blood Gluc Sensor (DEXCOM G7 SENSOR) MISC Apply each device on your skin every 10 days 03/19/22   [name], DO  hydrOXYzine (ATARAX) 25 MG tablet Take 1 tablet (25 mg total) by mouth at bedtime. Patient not taking: Reported on 05/10/2022 02/03/22   Lacinda Axon, MD  insulin isophane & regular human KwikPen (NOVOLIN 70/30 KWIKPEN) (70-30) 100 UNIT/ML KwikPen Inject 15 Units into the skin in the morning AND 5 Units every evening. Patient not taking: Reported on 06/16/2022 03/19/22   [name], DO  Insulin Pen Needle (UNIFINE PENTIPS) 32G X 4 MM MISC Use in the morning and at bedtime 12/11/21 12/06/22  Iona Beard, MD  rosuvastatin  (CRESTOR) 40 MG tablet Take 1 tablet (40 mg total) by mouth daily. Patient not taking: Reported on 03/22/2022 11/24/21   Rosezetta Schlatter, MD     Family History  Problem Relation Age of Onset   Diabetes Mother    Hypertension Mother    Hypertension Father     Social History   Socioeconomic History   Marital status: Married    Spouse name: Not on file   Number of children: Not on file   Years of education: Not on file   Highest education level: Not on file  Occupational History   Not on file  Tobacco Use   Smoking status: Every Day    Packs/day: 0.50    Years: 40.00    Total pack years: 20.00    Types: Cigarettes   Smokeless tobacco: Never  Vaping Use   Vaping Use: Never used  Substance and Sexual Activity   Alcohol use: Not Currently   Drug use: Yes    Frequency: 4.0 times per week  Types: Marijuana    Comment: smokes every other day   Sexual activity: Not Currently    Birth control/protection: Abstinence  Other Topics Concern   Not on file  Social History Narrative   Not on file   Social Determinants of Health   Financial Resource Strain: Medium Risk (02/28/2022)   Overall Financial Resource Strain (CARDIA)    Difficulty of Paying Living Expenses: Somewhat hard  Food Insecurity: No Food Insecurity (06/16/2022)   Hunger Vital Sign    Worried About Running Out of Food in the Last Year: Never true    Ran Out of Food in the Last Year: Never true  Transportation Needs: Unmet Transportation Needs (06/16/2022)   PRAPARE - Transportation    Lack of Transportation (Medical): Yes    Lack of Transportation (Non-Medical): Yes  Physical Activity: Not on file  Stress: Stress Concern Present (02/28/2022)   [place]    Feeling of Stress : To some extent  Social Connections: Not on file     Review of Systems: A 12 point ROS discussed and pertinent positives are indicated in the HPI above.  All other systems  are negative.  Review of Systems  Vital Signs: BP (!) 162/66   Pulse 75   Temp 98 F (36.7 C) (Axillary)   Resp 16   Ht 5\' 7"  (1.702 m)   Wt 142 lb 3.2 oz (64.5 kg)   LMP 09/29/2011   SpO2 93%   Breastfeeding Yes   BMI 22.27 kg/m   Physical Exam Vitals reviewed.  Constitutional:      Appearance: Normal appearance.  HENT:     Head: Normocephalic and atraumatic.  Eyes:     Extraocular Movements: Extraocular movements intact.  Cardiovascular:     Rate and Rhythm: Normal rate and regular rhythm.  Pulmonary:     Effort: Pulmonary effort is normal. No respiratory distress.     Breath sounds: Normal breath sounds.  Abdominal:     Palpations: Abdomen is soft.  Musculoskeletal:        General: Normal range of motion.     Cervical back: Normal range of motion.  Skin:    General: Skin is warm and dry.  Neurological:     General: No focal deficit present.     Mental Status: She is alert and oriented to person, place, and time.  Psychiatric:        Mood and Affect: Mood normal.        Behavior: Behavior normal.        Thought Content: Thought content normal.        Judgment: Judgment normal.      Labs:  CBC: Recent Labs    06/23/22 0245 06/24/22 0450 06/25/22 0012 06/26/22 0024  WBC 10.7* 9.6 8.9 7.1  HGB 8.0* 8.6* 9.0* 8.2*  HCT 25.7* 26.1* 27.8* 26.3*  PLT 218 260 283 313    COAGS: Recent Labs    03/18/22 0545 05/14/22 0637 06/17/22 1948  INR 0.9 1.1 1.1  APTT  --   --  32    BMP: Recent Labs    06/24/22 0450 06/24/22 1524 06/25/22 0012 06/26/22 0024  NA 135 138 137 139  K 4.4 4.7 4.5 4.5  CL 107 110 110 111  CO2 16* 16* 17* 19*  GLUCOSE 102* 120* 145* 125*  BUN 73* 71* 72* 73*  CALCIUM 7.6* 7.8* 7.9* 7.8*  CREATININE 6.54* 6.67* 6.71* 6.59*  GFRNONAA 7* 7*  7* 7*    LIVER FUNCTION TESTS: Recent Labs    06/21/22 0347 06/21/22 1640 06/22/22 0316 06/22/22 1625 06/23/22 0245 06/23/22 1530 06/24/22 0450 06/24/22 1524  06/25/22 0012 06/26/22 0024  BILITOT 0.4  --  0.2*  --  0.3  --  0.5  --   --   --   AST 15  --  37  --  20  --  14*  --   --   --   ALT <5  --  9  --  7  --  5  --   --   --   ALKPHOS 110  --  182*  --  181*  --  172*  --   --   --   PROT 4.5*  --  4.4*  --  4.8*  --  4.8*  --   --   --   ALBUMIN 2.3*   < > 2.1*   < > 2.4*   < > 2.2* 2.4* 2.2* 2.2*   < > = values in this interval not displayed.    TUMOR MARKERS: No results for input(s): "AFPTM", "CEA", "CA199", "CHROMGRNA" in the last 8760 hours.  Assessment and Plan:  Recurrent right pleural effusion.  Will proceed with image guided placement of a pigtail catheter today by Dr. Kathlene Cote so CT Surgery can treat with pleurodesis.  Risks and benefits of chest tube placement were discussed with the patient including bleeding, infection, damage to adjacent structures, malfunction of the tube requiring additional procedures and sepsis.  2. Acute on CKD 4. Renal function unlikely to recover. Now needing hemodialysis.  Will proceed with image guided placement of a tunneled hemodialysis catheter today by Dr. Kathlene Cote.  Risks and benefits discussed with the patient including, but not limited to bleeding, infection, vascular injury, pneumothorax which may require chest tube placement, air embolism or even death  All of the patient's questions were answered, patient is agreeable to proceed.  Consent signed and in IR.  Thank you for allowing our service to participate in Amber Cross 's care.  Electronically Signed: Murrell Redden, PA-C   06/26/2022, 9:32 AM      I spent a total of 40 Minutes in face to face in clinical consultation, greater than 50% of which was counseling/coordinating care for Chest tube and HD cath.

## 2022-06-26 NOTE — Procedures (Signed)
Interventional Radiology Procedure:   Indications: Renal failure and recurrent right pleural effusion.  Procedure: 1) CT guided placement of right chest tube 2) Placement of tunneled dialysis catheter  Findings: 14 Fr drain placed in right pleural space.  Yellow pleural fluid is draining from the chest tube.  19 cm Palindrome catheter placed in right IJ.  Tip near the SVC/RA junction.   Complications: No immediate complications noted.     EBL: Minimal  Plan: 1) Dialysis catheter is ready to use.  2) Chest tube to wall suction.     Arlett Goold R. Anselm Pancoast, MD  Pager: [phone]

## 2022-06-26 NOTE — Progress Notes (Signed)
Nephrology Follow-Up note   Assessment/Recommendations: Amber Cross is a/an 61 y.o. female with a past medical history significant for HFrEF, HTN, DM2, small cell lung cancer, chronic hypoxic respiratory failure, and CKD IV who presents with acute on chronic hypoxic respiratory failure   # AKI on CKD 4: Likely secondary to hemodynamic mediated process possibly some degree of cardiorenal syndrome and possibly some ATN.  Significant CKD related to diabetes (biopsy proven) with severe interstitial fibrosis and tubular atrophy.   ------------------------ - She has no emergent indication for dialysis however she is making minimal urine and labs have continued to worsen over time.  Her significant scarring on renal biopsy as above does mean that she is less likely to recover.  Her labs have plateaued - Plan for starting dialysis on 11/30  - NPO after midnight tonight   - Consult IR for tunneled catheter placement  - Re-requested strict ins/outs - order has been in place -Maintain MAP>65 for optimal renal perfusion.    # Acute on chronic hypoxic respiratory failure: Continue with volume optimization as desired based on cardiac status.  Supplemental oxygen as needed. Overall improved   # Pericardial effusion: Status post pericardial window on 06/18/2022.  Cytology negative for malignancy. Cardiothoracic surgery following.   # CKD stage IV  - note her baseline Cr is 3-3.3 per charting and with severe interstitial fibrosis and tubular atrophy on past biopsy  # Hypertension: Acceptable on current regimen    # HFrEF:  has had minimal urine output and minimal response to lasix IV.  Continue to monitor closely.  Volume as above   # Metabolic acidosis: likely 2/2 CKD.  Resumed oral sodium bicarbonate   # Anemia - multifactorial. Iron sat 29. Avoiding ESA given malignancy. Transfusion as needed  # Small cell lung cancer: Radiation finished in October.  Per charting she is planning to see oncology in  December for restaging.  Would consider reaching out to oncology    # Hyperphosphatemia - increased renvela   Disposition - continue inpatient monitoring   ___________________________________________________________  CC: shortness of breath   Interval History/Subjective:  Strict ins/outs ordered but nothing is charted for urine output over 11/28.  Previously we set a date for 11/30 for starting dialysis if no significant improvement by that time.  She got lasix and albumin yesterday.  She states that her daughter stated that she expected her to have already been on dialysis.  The patient is willing to do dialysis.  Eating breakfast now.    Review of systems:     She denies any shortness of breath at rest or exertion  Denies chest pain  She had nausea overnight  Medications:  Current Facility-Administered Medications  Medication Dose Route Frequency Provider Last Rate Last Admin   atorvastatin (LIPITOR) tablet 80 mg  80 mg Oral Daily Barrett, Erin R, PA-C   80 mg at 06/25/22 0922   bisacodyl (DULCOLAX) EC tablet 10 mg  10 mg Oral Daily Stehler, Mel Almond C, PA-C   10 mg at 06/25/22 8119   carvedilol (COREG) tablet 12.5 mg  12.5 mg Oral BID WC Reesa Chew, MD   12.5 mg at 06/26/22 0631   Chlorhexidine Gluconate Cloth 2 % PADS 6 each  6 each Topical Daily Dahlia Byes, MD   6 each at 06/25/22 0923   gabapentin (NEURONTIN) capsule 100 mg  100 mg Oral QHS Stehler, Bailey C, PA-C   100 mg at 06/25/22 2205   heparin injection 5,000 Units  5,000  Units Subcutaneous Q12H Dahlia Byes, MD   5,000 Units at 06/25/22 2206   hydrALAZINE (APRESOLINE) tablet 50 mg  50 mg Oral Q12H Geralynn Rile, MD   50 mg at 06/26/22 0631   insulin aspart (novoLOG) injection 0-9 Units  0-9 Units Subcutaneous TID WC & HS Dahlia Byes, MD   1 Units at 06/25/22 2204   isosorbide mononitrate (IMDUR) 24 hr tablet 30 mg  30 mg Oral Daily Geralynn Rile, MD   30 mg at 06/25/22 0938   metoCLOPramide  (REGLAN) injection 10 mg  10 mg Intravenous Virl Son, MD   10 mg at 06/25/22 2206   nitroGLYCERIN (NITROSTAT) SL tablet 0.4 mg  0.4 mg Sublingual Q5 min PRN Magdalene River, PA-C       ondansetron [facility]) injection 4 mg  4 mg Intravenous Q6H PRN Dahlia Byes, MD   4 mg at 06/25/22 2201   Oral care mouth rinse  15 mL Mouth Rinse PRN Dahlia Byes, MD       oxyCODONE (Oxy IR/ROXICODONE) immediate release tablet 5 mg  5 mg Oral Q4H PRN Dahlia Byes, MD   5 mg at 06/25/22 2212   pantoprazole (PROTONIX) EC tablet 40 mg  40 mg Oral Daily Magdalene River, PA-C   40 mg at 06/25/22 1829   promethazine (PHENERGAN) 6.25 mg in sodium chloride 0.9 % 50 mL IVPB  6.25 mg Intravenous Q6H PRN Dahlia Byes, MD       senna-docusate (Senokot-S) tablet 1 tablet  1 tablet Oral QHS Wynelle Beckmann C, PA-C   1 tablet at 06/25/22 2205   sevelamer carbonate (RENVELA) tablet 1,600 mg  1,600 mg Oral TID WC Claudia Desanctis, MD   1,600 mg at 06/25/22 1628   sodium bicarbonate tablet 1,300 mg  1,300 mg Oral BID Claudia Desanctis, MD   1,300 mg at 06/25/22 2205   traZODone (DESYREL) tablet 50 mg  50 mg Oral QHS PRN Wynelle Beckmann C, PA-C   50 mg at 06/24/22 2147      Physical Exam: Vitals:   06/26/22 0300 06/26/22 0631  BP: (!) 159/67 132/60  Pulse: 74   Resp: 13   Temp: (!) 97 F (36.1 C)   SpO2: 95%    No intake/output data recorded. No intake or output data in the 24 hours ending 06/26/22 0709  General adult female in bed in no acute distress    HEENT normocephalic atraumatic extraocular movements intact sclera anicteric Neck supple trachea midline Lungs clear but reduced to auscultation bilaterally normal work of breathing at rest; room air Heart S1S2  Abdomen soft nontender nondistended Extremities 2-3+ edema bilateral lower extremities Psych normal mood and affect Neuro oriented to person, location, and time and is conversant. More awake again today   Test Results I personally  reviewed new and old clinical labs and radiology tests Lab Results  Component Value Date   NA 139 06/26/2022   K 4.5 06/26/2022   CL 111 06/26/2022   CO2 19 (L) 06/26/2022   BUN 73 (H) 06/26/2022   CREATININE 6.59 (H) 06/26/2022   CALCIUM 7.8 (L) 06/26/2022   ALBUMIN 2.2 (L) 06/26/2022   PHOS 7.1 (H) 06/26/2022    CBC Recent Labs  Lab 06/24/22 0450 06/25/22 0012 06/26/22 0024  WBC 9.6 8.9 7.1  HGB 8.6* 9.0* 8.2*  HCT 26.1* 27.8* 26.3*  MCV 86.7 88.0 88.9  PLT 260 283 313    Claudia Desanctis, MD 7:34 AM 06/26/2022

## 2022-06-26 NOTE — Progress Notes (Signed)
Mobility Specialist Progress Note    06/26/22 1136  Mobility  Activity Ambulated with assistance in hallway  Level of Assistance Minimal assist, patient does 75% or more  Assistive Device Front wheel walker  Distance Ambulated (ft) 360 ft  Activity Response Tolerated well  Mobility Referral Yes  $Mobility charge 1 Mobility   Pre-Mobility: 69 HR, 93% SpO2 Post-Mobility: 70 HR, 166/62 (93) BP, 93% SpO2  Pt received sitting EOB and agreeable. No complaints on walk. Returned to chair with call bell in reach.    Hildred Alamin Mobility Specialist  Please Psychologist, sport and exercise or Rehab Office at [phone]

## 2022-06-26 NOTE — Progress Notes (Signed)
HD#10 Subjective:  Patient summary: Amber Cross is a 61 year old female living with HFrEF, type 2 diabetes, small cell lung cancer, CKD 4 who presented with acute on chronic hypoxic respiratory failure secondary to moderate-large pericardial effusion with cardiac tamponade.  Patient underwent subxiphoid pericardiocentesis on 11/21 which collected about 5 cc of clear fluid.  Cytology of the pleural and pericardial fluid were negative for malignancy.  She had a right sided chest tube placed during the procedure but has been pulled out (unknown when). Patient is transferred out of the ICU to progressive floor.  Patient is seen at bedside today.  She appears comfortable and in no acute distress.  Patient is not on nasal cannula.  Patient understands the need for dialysis and is ready for.  She told me that she is cancer free.  Objective:  Vital signs in last 24 hours: Vitals:   06/25/22 1922 06/25/22 2323 06/26/22 0300 06/26/22 0631  BP: (!) 168/68 (!) 132/59 (!) 159/67 132/60  Pulse: 68 64 74   Resp: 17 15 13    Temp: (!) 97.2 F (36.2 C) (!) 97.3 F (36.3 C) (!) 97 F (36.1 C)   TempSrc: Oral Oral Oral   SpO2:   95%   Weight:   64.5 kg   Height:       Supplemental O2: Room Air SpO2: 95 % O2 Flow Rate (L/min): 2 L/min FiO2 (%): 40 %   Physical Exam:  Physical Exam Constitutional:      General: She is not in acute distress. Eyes:     General:        Right eye: No discharge.        Left eye: No discharge.     Conjunctiva/sclera: Conjunctivae normal.  Cardiovascular:     Rate and Rhythm: Normal rate and regular rhythm.  Pulmonary:     Effort: Pulmonary effort is normal.     Breath sounds: Normal breath sounds.     Comments: Crackles heard at bilateral bases, right worse than left.  Diminished breath sound on the right lung base. Musculoskeletal:     Comments: +2 pitting edema bilateral lower extremity up to bilateral lower thighs.  Bilateral feet are warm and  well-perfused.  Skin:    General: Skin is warm.  Neurological:     Mental Status: She is alert and oriented to person, place, and time.  Psychiatric:        Behavior: Behavior normal.     Filed Weights   06/24/22 0500 06/25/22 0655 06/26/22 0300  Weight: 68.3 kg 69.2 kg 64.5 kg     Intake/Output Summary (Last 24 hours) at 06/26/2022 0730 Last data filed at 06/26/2022 0400 Gross per 24 hour  Intake --  Output 500 ml  Net -500 ml   Net IO Since Admission: 1,786.8 mL [06/26/22 0730]  Pertinent Labs:    Latest Ref Rng & Units 06/26/2022   12:24 AM 06/25/2022   12:12 AM 06/24/2022    4:50 AM  CBC  WBC 4.0 - 10.5 K/uL 7.1  8.9  9.6   Hemoglobin 12.0 - 15.0 g/dL 8.2  9.0  8.6   Hematocrit 36.0 - 46.0 % 26.3  27.8  26.1   Platelets 150 - 400 K/uL 313  283  260        Latest Ref Rng & Units 06/26/2022   12:24 AM 06/25/2022   12:12 AM 06/24/2022    3:24 PM  CMP  Glucose 70 - 99 mg/dL 125  145  120   BUN 6 - 20 mg/dL 73  72  71   Creatinine 0.44 - 1.00 mg/dL 6.59  6.71  6.67   Sodium 135 - 145 mmol/L 139  137  138   Potassium 3.5 - 5.1 mmol/L 4.5  4.5  4.7   Chloride 98 - 111 mmol/L 111  110  110   CO2 22 - 32 mmol/L 19  17  16    Calcium 8.9 - 10.3 mg/dL 7.8  7.9  7.8     Imaging: No results found.  Assessment/Plan:   Principal Problem:   Tamponade Active Problems:   Type 2 diabetes mellitus with diabetic polyneuropathy (HCC)   Acute on chronic systolic congestive heart failure (HCC)   Small cell lung cancer, right upper lobe ([place])   AKI (acute kidney injury) [facility])   Hypertensive emergency   Patient Summary: Amber Cross is a 61 y.o. Amber Cross is a 61 year old female living with HFrEF, type 2 diabetes, small cell lung cancer, CKD 4 who presented with acute on chronic hypoxic respiratory failure secondary to moderate-large pericardial effusion with cardiac tamponade status post pericardiocentesis on 11/21.  Acute on chronic hypoxic respiratory  failure Cardiac tamponade s/p pericardiocentesis Her volume retention are multifactorial in the setting of recent chest radiation causing pericardial fibrosis, HFrEF, AKI on CKD 4 and hypoalbuminemia.  Her respiratory status has improved and she is on room air. Cautious diuresis in the setting of CKD 5.  She will likely need hemodialysis given her kidney function fails to improve with minimal urine output on IV Lasix. -Appreciate CT surgery, cardiology and nephrology recommendation  Bilateral pleural effusion In the setting of volume retention from HFrEF and CKD.  Her right chest tube was removed.  From serial chest x-ray, her right-sided pleural effusion is enlarging.  If her respiratory status is stable, may not need to have another chest tube.  Her pleural effusion will likely improve with hemodialysis.  HFrEF Hypertension Minimal urine output with IV Lasix.  Limited options for GDMT in the setting of CKD 5. -May need hemodialysis for volume removal -Continue Coreg 12.5, Imdur and hydralazine.  Blood pressure is within normal limits.  AKI on CKD 4 Nephrotic range proteinuria Patient has had nephrotic range proteinuria, now possibly in combination with cardiorenal. Past biopsy shows severe interstitial fibrosis and tubular atrophy. Given the degree of her scarring, less chance of recovery.  Her kidney function is stable but has not improved for the last several days. -Anticipating the need for dialysis -Defer Lasix and albumin infusion to nephrology -Continue sodium bicarb.  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  Per notes, patient has good response with radiation treatment on CT scan in August without new or progressive disease.  Her oncology team recommended repeat CT chest in 3-6 months for the first year and then every 6 months until 5 years disease-free mark.  Her last  CT chest in November did not well visualize the pulmonary nodule in the setting of overlying airspace opacities.   -Follow-up with oncology outpatient -She is due for a brain MRI and repeat CT chest outpatient.  Will hold off on ordering them now in the setting of permanent access placement.  Normocytic anemia In the setting of chronic kidney disease.  No evidence of iron deficiency. -No ESA with a malignancy -No indication for transfusion right now  Urinary retention Patient has a foley  cath placed since her hospitalization at [facility].  Will keep it in for now to monitor urine output.  Can consider voiding trial after dialysis -Follow-up with urology outpatient  Type 2 diabetes A1c of 9.7 in July.  CBG within good range. -Continue sliding scale insulin for now.  She only required 4 units yesterday. -Continue gabapentin -Recheck A1c in a.m.  Diet: Carb/Renal IVF: None,None VTE: Heparin Code: Full PT/OT recs: Pending TOC recs:    Dispo: Anticipated discharge to Home in 4 days pending initiating dialysis.   [name], DO 06/26/2022, 7:30 AM Pager: [phone]  Please contact the on call pager after 5 pm and on weekends at [phone].

## 2022-06-26 NOTE — Progress Notes (Signed)
   06/26/22 1430  Clinical Encounter Type  Visited With Patient not available;Health care provider  Visit Type Follow-up  Referral From Nurse  Consult/Referral To Chaplain Melvenia Beam)  Recommendations Facilitate Notary   Attempted Notarization - Patient gone for procedure. [address] [place], [place] [phone]

## 2022-06-26 NOTE — TOC Progression Note (Signed)
Transition of Care [facility]) - Progression Note    Patient Details  Name: Matalyn Nawaz MRN: [id number] Date of Birth: 1961/06/06  Transition of Care [facility]) CM/SW Contact  Zenon Mayo, RN Phone Number: 06/26/2022, 4:10 PM  Clinical Narrative:    Txr from [place], from home with family with support, s/p pericardial windao, tachy. Patient to start dialysis today, IR to put in dialysis catheter today and also pigtail chest tube to suction.  TOC following.    Expected Discharge Plan: [place] Barriers to Discharge: Continued Medical Work up  Expected Discharge Plan and Services Expected Discharge Plan: [place] In-house Referral: NA Discharge Planning Services: CM Consult   Living arrangements for the past 2 months: Single Family Home                                       Social Determinants of Health (SDOH) Interventions    Readmission Risk Interventions     No data to display

## 2022-06-26 NOTE — Telephone Encounter (Signed)
Appointment scheduled. Confirmation letter sent to referring office with details of the appointment. Details of the appointment will be on discharge papers.

## 2022-06-26 NOTE — Evaluation (Signed)
Occupational Therapy Evaluation Patient Details Name: Amber Cross MRN: [id number] DOB: [birth year] Today's Date: 06/26/2022   History of Present Illness Patient is a 61 yo female presenting to the ED with SOB on 06/16/22. Found to have acute on chronic hypoxic respiratory failure secondary to moderate-large pericardial effusion with cardiac tamponade.  Patient underwent subxiphoid pericardiocentesis on 11/21 which collected about 5 cc of clear fluid.  Cytology of the pleural and pericardial fluid were negative for malignancy. R chest tube placed and since removed on 11/23. PMH inlcudes DM, HTN, HLD, CVA, tob use, CHF, CKD III, nephrotic syndrome, depression, RUL small cell lung Ca s/p XRT July 2023, COPD   Clinical Impression   Prior to this admission, patient living with family, and independent with ADLs. Patient typically uses a rollator to ambulate. Currently, patient presenting with lethargy, and potential cognitive deficits, though appropriate in all responses with OT. Patient mind A for ADLs, requiring increased assist with TED Hose and socks, but attests she has family that can help, patient able to complete good teach back instructions for hose when OT donned. Patient handed off to mobility at end of session for ambulation. OT will continue to follow acutely, however requested no HH come to her house.      Recommendations for follow up therapy are one component of a multi-disciplinary discharge planning process, led by the attending physician.  Recommendations may be updated based on patient status, additional functional criteria and insurance authorization.   Follow Up Recommendations  No OT follow up (Patient does not want [place])     Assistance Recommended at Discharge Intermittent Supervision/Assistance  Patient can return home with the following A little help with walking and/or transfers;A lot of help with bathing/dressing/bathroom;Assist for transportation;Help with stairs or ramp  for entrance    Functional Status Assessment  Patient has had a recent decline in their functional status and demonstrates the ability to make significant improvements in function in a reasonable and predictable amount of time.  Equipment Recommendations  None recommended by OT (Patient has DME needed)    Recommendations for Other Services       Precautions / Restrictions Precautions Precautions: Fall Restrictions Weight Bearing Restrictions: No      Mobility Bed Mobility Overal bed mobility: Needs Assistance Bed Mobility: Supine to Sit     Supine to sit: Min guard     General bed mobility comments: increased time, HOB elevated    Transfers Overall transfer level: Needs assistance Equipment used: Rolling walker (2 wheels) Transfers: Sit to/from Stand Sit to Stand: Min assist           General transfer comment: handed off to mobility specialist at end of session      Balance                                           ADL either performed or assessed with clinical judgement   ADL Overall ADL's : Needs assistance/impaired Eating/Feeding: Set up;Sitting   Grooming: Set up;Sitting   Upper Body Bathing: Min guard;Sitting   Lower Body Bathing: Moderate assistance;Sitting/lateral leans;Sit to/from stand   Upper Body Dressing : Min guard;Sitting   Lower Body Dressing: Maximal assistance;Sitting/lateral leans;Sit to/from stand Lower Body Dressing Details (indicate cue type and reason): to don compression stockings and socks Toilet Transfer: Min guard;Rolling walker (2 wheels)  Functional mobility during ADLs: Minimal assistance;Cueing for sequencing;Cueing for safety;Rolling walker (2 wheels) General ADL Comments: Patient presenting with lethargy, fatigue, and decreased activity tolerance     Vision Baseline Vision/History: 1 Wears glasses Ability to See in Adequate Light: 0 Adequate Patient Visual Report: No change from  baseline       Perception     Praxis      Pertinent Vitals/Pain Pain Assessment Pain Assessment: No/denies pain     Hand Dominance Right   Extremity/Trunk Assessment Upper Extremity Assessment Upper Extremity Assessment: Generalized weakness   Lower Extremity Assessment Lower Extremity Assessment: Defer to PT evaluation   Cervical / Trunk Assessment Cervical / Trunk Assessment: Normal   Communication Communication Communication: No difficulties   Cognition Arousal/Alertness: Lethargic Behavior During Therapy: Flat affect Overall Cognitive Status: Within Functional Limits for tasks assessed                                 General Comments: Patient admitting she was confused this morning, and moments off nodding off during session, but oriented and appopriarte with line of questions     General Comments       Exercises     Shoulder Instructions      Home Living Family/patient expects to be discharged to:: Private residence Living Arrangements: Children Available Help at Discharge: Family;Friend(s);Available PRN/intermittently (son) Type of Home: House Home Access: Stairs to enter [employer] of Steps: 3-4 Entrance Stairs-Rails: Can reach both Home Layout: One level     Bathroom Shower/Tub: Teacher, early years/pre: Standard Bathroom Accessibility: No   Home Equipment: Merchant navy officer (4 wheels);Tub bench          Prior Functioning/Environment Prior Level of Function : Independent/Modified Independent;Driving             Mobility Comments: pt ambulating with use of rollator since CVA ADLs Comments: occasionally used tub bench        OT Problem List: Decreased activity tolerance;Decreased strength;Decreased safety awareness;Decreased knowledge of use of DME or AE      OT Treatment/Interventions: Self-care/ADL training;Therapeutic exercise;Energy conservation;DME and/or AE instruction;Manual  therapy;Therapeutic activities;Patient/family education;Balance training    OT Goals(Current goals can be found in the care plan section) Acute Rehab OT Goals Patient Stated Goal: to get stronger OT Goal Formulation: With patient Time For Goal Achievement: 07/10/22 Potential to Achieve Goals: Good  OT Frequency: Min 2X/week    Co-evaluation              AM-PAC OT "6 Clicks" Daily Activity     Outcome Measure Help from another person eating meals?: A Little Help from another person taking care of personal grooming?: A Little Help from another person toileting, which includes using toliet, bedpan, or urinal?: A Little Help from another person bathing (including washing, rinsing, drying)?: A Lot Help from another person to put on and taking off regular upper body clothing?: A Little Help from another person to put on and taking off regular lower body clothing?: A Lot 6 Click Score: 16   End of Session Equipment Utilized During Treatment: Rolling walker (2 wheels) Nurse Communication: Mobility status  Activity Tolerance: Patient tolerated treatment well Patient left: in bed;with call bell/phone within reach;Other (comment) (handed off to mobility specialist for ambulation)  OT Visit Diagnosis: Unsteadiness on feet (R26.81);Muscle weakness (generalized) (M62.81)  Time: 1100-1120 OT Time Calculation (min): 20 min Charges:  OT General Charges $OT Visit: 1 Visit OT Evaluation $OT Eval Moderate Complexity: 1 Mod  Corinne Ports E. Pepper Wyndham, OTR/L Acute Rehabilitation Services [phone]   Ascencion Dike 06/26/2022, 3:22 PM

## 2022-06-26 NOTE — Progress Notes (Addendum)
8 Days Post-Op Procedure(s) (LRB): SUBXYPHOID PERICARDIAL WINDOW (N/A) TRANSESOPHAGEAL ECHOCARDIOGRAM (TEE) (N/A) CHEST TUBE INSERTION (Right) Subjective: Feels ok  Objective: Vital signs in last 24 hours: Temp:  [97 F (36.1 C)-97.9 F (36.6 C)] 97 F (36.1 C) (11/29 0300) Pulse Rate:  [64-77] 74 (11/29 0300) Cardiac Rhythm: Normal sinus rhythm (11/29 0716) Resp:  [13-17] 13 (11/29 0300) BP: (119-168)/(54-77) 132/60 (11/29 0631) SpO2:  [89 %-95 %] 95 % (11/29 0300) Weight:  [64.5 kg] 64.5 kg (11/29 0300)  Hemodynamic parameters for last 24 hours:    Intake/Output from previous day: 11/28 0701 - 11/29 0700 In: -  Out: 600 [Urine:500; Emesis/NG output:100] Intake/Output this shift: No intake/output data recorded.  General appearance: alert, cooperative, and no distress Heart: regular rate and rhythm and soft systolic murmur Lungs: Diminished right greater than left base Abdomen: Benign Extremities: Positive bilateral lower extremity pitting edema Wound: Incision healing well without evidence of infection  Lab Results: Recent Labs    06/25/22 0012 06/26/22 0024  WBC 8.9 7.1  HGB 9.0* 8.2*  HCT 27.8* 26.3*  PLT 283 313   BMET:  Recent Labs    06/25/22 0012 06/26/22 0024  NA 137 139  K 4.5 4.5  CL 110 111  CO2 17* 19*  GLUCOSE 145* 125*  BUN 72* 73*  CREATININE 6.71* 6.59*  CALCIUM 7.9* 7.8*    PT/INR: No results for input(s): "LABPROT", "INR" in the last 72 hours. ABG    Component Value Date/Time   PHART 7.240 (L) 06/19/2022 0408   HCO3 19.2 (L) 06/19/2022 0408   TCO2 21 (L) 06/19/2022 0408   ACIDBASEDEF 8.0 (H) 06/19/2022 0408   O2SAT 91 06/19/2022 0408   CBG (last 3)  Recent Labs    06/25/22 1609 06/25/22 2111 06/26/22 0606  GLUCAP 123* 121* 120*    Meds Scheduled Meds:  atorvastatin  80 mg Oral Daily   bisacodyl  10 mg Oral Daily   carvedilol  12.5 mg Oral BID WC   Chlorhexidine Gluconate Cloth  6 each Topical Daily   gabapentin   100 mg Oral QHS   heparin injection (subcutaneous)  5,000 Units Subcutaneous Q12H   hydrALAZINE  50 mg Oral Q12H   insulin aspart  0-9 Units Subcutaneous TID WC & HS   isosorbide mononitrate  30 mg Oral Daily   metoCLOPramide (REGLAN) injection  10 mg Intravenous Q12H   pantoprazole  40 mg Oral Daily   senna-docusate  1 tablet Oral QHS   sevelamer carbonate  1,600 mg Oral TID WC   sodium bicarbonate  1,300 mg Oral BID   Continuous Infusions:  promethazine (PHENERGAN) injection (IM or IVPB)     PRN Meds:.nitroGLYCERIN, ondansetron (ZOFRAN) IV, mouth rinse, oxyCODONE, promethazine (PHENERGAN) injection (IM or IVPB), traZODone  Xrays DG Chest Port 1 View  Result Date: 06/25/2022 CLINICAL DATA:  Chest tube removed. EXAM: PORTABLE CHEST 1 VIEW COMPARISON:  Chest radiograph 06/22/2022. FINDINGS: The heart is enlarged, unchanged. The upper mediastinal contours are stable. There are small to moderate-sized bilateral pleural effusions with adjacent airspace opacity, increased in size on the right and not significantly changed on the left. The upper lobes are well aerated. The previously seen right apical pneumothorax is no longer definitely seen. There is no left pneumothorax. There is no acute osseous abnormality. IMPRESSION: 1. No definite residual right apical pneumothorax. 2. Bilateral pleural effusions with adjacent airspace opacity, increased in size on the right. Electronically Signed   By: Valetta Mole M.D.   On:  06/25/2022 08:05    Assessment/Plan: S/P Procedure(s) (LRB): SUBXYPHOID PERICARDIAL WINDOW (N/A) TRANSESOPHAGEAL ECHOCARDIOGRAM (TEE) (N/A) CHEST TUBE INSERTION (Right)  1 afebrile, SBP 119-168, normal sinus rhythm 2 O2 sats good on room air 3 urine output 500 cc/24H, nephrology continues to monitor and plans dialysis tomorrow 4 anemia remains stable, chronic and multifactorial 5 CXR-moderate size right pleural effusion, small left effusion-may require thoracentesis or  possibly Pleurx catheter 6 routine pulmonary hygiene and mobilize as able 7 spoke with teaching service for internal medicine and they will be seeing the patient today and will be resuming primary medical management   LOS: 10 days    John Giovanni PA-C Pager [phone] 06/26/2022   Recurrent R pleural effusion- Pigtail IR drain to help with SOB and can be used for chemical pleurodesis as the benign effusion is likely to recurr. Postop echocardiogram shows resolution of pericardial effusion with window  patient examined and medical record reviewed,agree with above note. Dahlia Byes 06/26/2022

## 2022-06-27 ENCOUNTER — Encounter (HOSPITAL_COMMUNITY): Payer: Self-pay | Admitting: Certified Registered"

## 2022-06-27 ENCOUNTER — Ambulatory Visit (HOSPITAL_COMMUNITY)
Admission: RE | Admit: 2022-06-27 | Discharge: 2022-06-27 | Disposition: A | Discharge status: Home / Self Care | Payer: Commercial Managed Care - HMO | Source: Ambulatory Visit | Attending: Urology | Admitting: Urology

## 2022-06-27 ENCOUNTER — Inpatient Hospital Stay (HOSPITAL_COMMUNITY): Payer: Commercial Managed Care - HMO

## 2022-06-27 ENCOUNTER — Ambulatory Visit (HOSPITAL_COMMUNITY)
Admission: RE | Admit: 2022-06-27 | Discharge: 2022-06-27 | Disposition: A | Discharge status: Home / Self Care | Payer: Commercial Managed Care - HMO | Source: Ambulatory Visit | Attending: Radiation Oncology | Admitting: Radiation Oncology

## 2022-06-27 ENCOUNTER — Encounter (HOSPITAL_COMMUNITY): Payer: Self-pay

## 2022-06-27 ENCOUNTER — Other Ambulatory Visit: Payer: Self-pay

## 2022-06-27 DIAGNOSIS — N179 Acute kidney failure, unspecified: Secondary | ICD-10-CM

## 2022-06-27 DIAGNOSIS — I314 Cardiac tamponade: Secondary | ICD-10-CM | POA: Diagnosis not present

## 2022-06-27 DIAGNOSIS — N186 End stage renal disease: Secondary | ICD-10-CM

## 2022-06-27 DIAGNOSIS — N185 Chronic kidney disease, stage 5: Secondary | ICD-10-CM

## 2022-06-27 LAB — RENAL FUNCTION PANEL
Albumin: 2.4 g/dL — ABNORMAL LOW (ref 3.5–5.0)
Anion gap: 11 (ref 5–15)
BUN: 75 mg/dL — ABNORMAL HIGH (ref 6–20)
CO2: 19 mmol/L — ABNORMAL LOW (ref 22–32)
Calcium: 8.2 mg/dL — ABNORMAL LOW (ref 8.9–10.3)
Chloride: 107 mmol/L (ref 98–111)
Creatinine, Ser: 6.79 mg/dL — ABNORMAL HIGH (ref 0.44–1.00)
GFR, Estimated: 6 mL/min — ABNORMAL LOW (ref >60)
Glucose, Bld: 134 mg/dL — ABNORMAL HIGH (ref 70–99)
Phosphorus: 7.1 mg/dL — ABNORMAL HIGH (ref 2.5–4.6)
Potassium: 4.6 mmol/L (ref 3.5–5.1)
Sodium: 137 mmol/L (ref 135–145)

## 2022-06-27 LAB — CBC
HCT: 27.4 % — ABNORMAL LOW (ref 36.0–46.0)
Hemoglobin: 8.8 g/dL — ABNORMAL LOW (ref 12.0–15.0)
MCH: 28.2 pg (ref 26.0–34.0)
MCHC: 32.1 g/dL (ref 30.0–36.0)
MCV: 87.8 fL (ref 80.0–100.0)
Platelets: 370 10*3/uL (ref 150–400)
RBC: 3.12 MIL/uL — ABNORMAL LOW (ref 3.87–5.11)
RDW: 16.2 % — ABNORMAL HIGH (ref 11.5–15.5)
WBC: 7.8 10*3/uL (ref 4.0–10.5)
nRBC: 0 % (ref 0.0–0.2)

## 2022-06-27 LAB — GLUCOSE, CAPILLARY
Glucose-Capillary: 115 mg/dL — ABNORMAL HIGH (ref 70–99)
Glucose-Capillary: 149 mg/dL — ABNORMAL HIGH (ref 70–99)
Glucose-Capillary: 156 mg/dL — ABNORMAL HIGH (ref 70–99)

## 2022-06-27 LAB — HEMOGLOBIN A1C
Hgb A1c MFr Bld: 6.7 % — ABNORMAL HIGH (ref 4.8–5.6)
Mean Plasma Glucose: 146 mg/dL

## 2022-06-27 MED ORDER — HEPARIN SODIUM (PORCINE) 1000 UNIT/ML DIALYSIS
1000.0000 [IU] | INTRAMUSCULAR | Status: DC | PRN
  Filled 2022-06-27 (×2): qty 1

## 2022-06-27 MED ORDER — CHLORHEXIDINE GLUCONATE CLOTH 2 % EX PADS: 6.0000 | MEDICATED_PAD | Freq: Every day | CUTANEOUS | Status: DC

## 2022-06-27 MED ORDER — ALTEPLASE 2 MG IJ SOLR: 2.0000 mg | Freq: Once | INTRAMUSCULAR | Status: DC | PRN

## 2022-06-27 MED ORDER — ANTICOAGULANT SODIUM CITRATE 4% (200MG/5ML) IV SOLN: 5.0000 mL | Status: DC | PRN

## 2022-06-27 MED ORDER — HEPARIN SODIUM (PORCINE) 1000 UNIT/ML IJ SOLN
INTRAMUSCULAR | Status: AC
  Administered 2022-06-27: 3200 [IU]
  Filled 2022-06-27: qty 4

## 2022-06-27 NOTE — Progress Notes (Addendum)
      [address]       [place] [address]             [phone]     CT in place - 1,150 ml  in drainage thus far, keep in place for now, may need to consider talc depending on drainage over next several days. Plans noted to begin HD today.  CXR- good improvement on right side pleural effusion, tiny apical pntx   John Giovanni, PA-C    patient examined and medical record reviewed,agree with above note. Will leave R pleural drain in place until excess volume removed with HD and patient at dry wt. Would benefit from chemical pleurodesis at time drain is removed. CXR tomorrow   Blood pressure (!) 145/65, pulse 68, temperature 97.6 F (36.4 C), temperature source Oral, resp. rate 16, height 5\' 7"  (1.702 m), weight 68.5 kg, last menstrual period 09/29/2011, SpO2 92 %,   06/27/2022  P Prescott Gum MD

## 2022-06-27 NOTE — Progress Notes (Signed)
Upper extremity vein mapping has been completed.   Preliminary results in CV Proc.   Amber Cross 06/27/2022 1:59 PM

## 2022-06-27 NOTE — Procedures (Signed)
HD note  Some information was created later than the data was gathered due to patient care needs. The stated time with data is accurate.   Received patient in bed to unit.  Alert and oriented.  Informed consent signed and in chart.    Patient tolerated well.  Transported back to the room  Alert, without acute distress.  Hand-off given to patient's nurse.   Access used: [facility] Access issues: none  Total UF removed: 400 ml     [facility]

## 2022-06-27 NOTE — Progress Notes (Signed)
   06/27/22 0930  Clinical Encounter Type  Visited With Patient;Health care provider (Notary: Yvone Neu;  Volunteer Witnesses: Rachael Darby and Mariana Arn)  Visit Type Follow-up  Referral From Nurse Terie Purser, RN)  Consult/Referral To Chaplain Melvenia Beam)  Recommendations Facilitate Notary  Advance Directives (For Healthcare)  Does Patient Have a Medical Advance Directive? Yes  Does patient want to make changes to medical advance directive? Yes (ED - send information to MyChart)  Type of Advance Directive [place];Living will  Copy of [place] in Chart? Yes - validated most recent copy scanned in chart (See row information)  Copy of Living Will in Chart? Yes - validated most recent copy scanned in chart (See row information)  Would patient like information on creating a medical advance directive? No - Patient declined  [place]  Does Patient Have a Mental Health Advance Directive? No   Requested to coordinate Notary and two volunteers from [employer] for the signing of patient's Advance Directive: [place] and Living Will for Ms. [place]. Met with Ms. Beckles at patient's bedside on 06/27/2022 at 0930.   Ms. Gottsch stated that she does NOT have a court appointed [place].  Ms. Freundlich stated that she is NOT Married.   Ms. Schmelzer indicated that she intends to list her Friend: Gerhard Perches, [phone] as her [place].   Ms. Sarratt indicated that she intends to list her SON: Rochell Puett,  [phone] as her [place].   Ms. Barefield further indicated that her end-of-life wishes were clearly indicated by the placement of his initials on PART B: Living Will.   Chaplain introduced patient to: Notary: Yvone Neu Volunteer  Witnesses: Rachael Darby and Mariana Arn.   Ms. Corky Crafts stated that she has never met any of these individuals prior to this introduction.  Patient signed her Advance Directive in the presence of [place]; and volunteer witnesses: Rachael Darby and Mariana Arn, all of whom have signed in the appropriate spaces on Part: C of Ms. [facility] Advance Directive.   A copy of Mr. Tretter Advance Directive: [place] Will was placed in his hard chart; a copy was emailed for scanning into his electronic medical record; the original and one copy were then returned to the Ms. Juliane Lack.   [place], [place]. [phone].

## 2022-06-27 NOTE — Plan of Care (Signed)
  Problem: Education: Goal: Knowledge of General Education information will improve Description: Including pain rating scale, medication(s)/side effects and non-pharmacologic comfort measures Outcome: Progressing   Problem: Health Behavior/Discharge Planning: Goal: Ability to manage health-related needs will improve Outcome: Progressing   Problem: Clinical Measurements: Goal: Ability to maintain clinical measurements within normal limits will improve Outcome: Progressing Goal: Will remain free from infection Outcome: Progressing Goal: Diagnostic test results will improve Outcome: Progressing Goal: Respiratory complications will improve Outcome: Progressing Goal: Cardiovascular complication will be avoided Outcome: Progressing   Problem: Activity: Goal: Risk for activity intolerance will decrease Outcome: Progressing   Problem: Nutrition: Goal: Adequate nutrition will be maintained Outcome: Progressing   Problem: Coping: Goal: Level of anxiety will decrease Outcome: Progressing   Problem: Elimination: Goal: Will not experience complications related to bowel motility Outcome: Progressing Goal: Will not experience complications related to urinary retention Outcome: Progressing   Problem: Pain Managment: Goal: General experience of comfort will improve Outcome: Progressing   Problem: Safety: Goal: Ability to remain free from injury will improve Outcome: Progressing   Problem: Skin Integrity: Goal: Risk for impaired skin integrity will decrease Outcome: Progressing   Problem: Education: Goal: Ability to describe self-care measures that may prevent or decrease complications (Diabetes Survival Skills Education) will improve Outcome: Progressing Goal: Individualized Educational Video(s) Outcome: Progressing   Problem: Coping: Goal: Ability to adjust to condition or change in health will improve Outcome: Progressing   Problem: Fluid Volume: Goal: Ability to  maintain a balanced intake and output will improve Outcome: Progressing   Problem: Health Behavior/Discharge Planning: Goal: Ability to identify and utilize available resources and services will improve Outcome: Progressing Goal: Ability to manage health-related needs will improve Outcome: Progressing   Problem: Metabolic: Goal: Ability to maintain appropriate glucose levels will improve Outcome: Progressing   Problem: Nutritional: Goal: Maintenance of adequate nutrition will improve Outcome: Progressing Goal: Progress toward achieving an optimal weight will improve Outcome: Progressing   Problem: Skin Integrity: Goal: Risk for impaired skin integrity will decrease Outcome: Progressing   Problem: Tissue Perfusion: Goal: Adequacy of tissue perfusion will improve Outcome: Progressing   Problem: Education: Goal: Knowledge of disease or condition will improve Outcome: Progressing Goal: Knowledge of the prescribed therapeutic regimen will improve Outcome: Progressing   Problem: Activity: Goal: Risk for activity intolerance will decrease Outcome: Progressing   Problem: Cardiac: Goal: Will achieve and/or maintain hemodynamic stability Outcome: Progressing   Problem: Clinical Measurements: Goal: Postoperative complications will be avoided or minimized Outcome: Progressing   Problem: Respiratory: Goal: Respiratory status will improve Outcome: Progressing   Problem: Pain Management: Goal: Pain level will decrease Outcome: Progressing   Problem: Skin Integrity: Goal: Wound healing without signs and symptoms infection will improve Outcome: Progressing

## 2022-06-27 NOTE — Evaluation (Signed)
Physical Therapy Evaluation Patient Details Name: Amber Cross MRN: [id number] DOB: 10/01/1960 Today's Date: 06/27/2022  History of Present Illness  Patient is a 61 yo female presenting to the ED with SOB on 06/16/22. Found to have acute on chronic hypoxic respiratory failure secondary to moderate-large pericardial effusion with cardiac tamponade.  Patient underwent subxiphoid pericardiocentesis on 11/21 which collected about 5 cc of clear fluid.  Cytology of the pleural and pericardial fluid were negative for malignancy. R chest tube placed and since removed on 11/23. R chest tube placement and tunneled dialysis catheter 11/29. 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 All education has been completed and the patient has no further questions. Pt supervision for bed mobility due to lines, able to ambulate 460 ft min guard with rollator and 1 standing rest break required due to fatigue. Pt reports function during today's session was baseline and "possibly even better" due to completing mobility on RA with stable SpO2. See below for any follow-up Physical Therapy or equipment needs. PT is signing off. Thank you for this referral.    SpO2 >92% on RA     Recommendations for follow up therapy are one component of a multi-disciplinary discharge planning process, led by the attending physician.  Recommendations may be updated based on patient status, additional functional criteria and insurance authorization.  Follow Up Recommendations No PT follow up      Assistance Recommended at Discharge PRN  Patient can return home with the following  Assistance with cooking/housework;Help with stairs or ramp for entrance;Assist for transportation    Equipment Recommendations None recommended by PT   Recommendations for Other Services       Functional Status Assessment Patient has not had a recent decline in their functional status     Precautions / Restrictions Precautions Precautions: Fall Restrictions Weight Bearing Restrictions: No      Mobility  Bed Mobility Overal bed mobility: Needs Assistance Bed Mobility: Supine to Sit     Supine to sit: Supervision, HOB elevated     General bed mobility comments: supervision for line mgmt with increased time    Transfers Overall transfer level: Needs assistance Equipment used: Rollator (4 wheels) Transfers: Sit to/from Stand Sit to Stand: Supervision           General transfer comment: cues for hand placement for ascent from traditional bed height    Ambulation/Gait Ambulation/Gait assistance: Min guard Gait Distance (Feet): 460 Feet Assistive device: Rollator (4 wheels) Gait Pattern/deviations: Step-to pattern, Decreased stride length Gait velocity: decreased     General Gait Details: slow cautious gait with cues for obstacle negotiation with rollator; one standing rest break required due to pt endorsing increased fatigue  Stairs            Wheelchair Mobility    Modified Rankin (Stroke Patients Only)       Balance Overall balance assessment: Needs assistance Sitting-balance support: Feet supported Sitting balance-Leahy Scale: Good Sitting balance - Comments: prolonged sitting EOB during line mgmt with dynamic weight shifting outside BOS when donning socks with no LOB   Standing balance support: Single extremity supported, Bilateral upper extremity supported Standing balance-Leahy Scale: Fair Standing balance comment: brief periods of static standing without UE support with strong preference for B UE support on rollator for stability  Pertinent Vitals/Pain Pain Assessment Pain Assessment: Faces Faces Pain Scale: Hurts a little bit Pain Location: R  neck Pain Descriptors / Indicators: Aching, Discomfort Pain Intervention(s): Monitored during session    Home Living Family/patient expects to be discharged to:: Private residence Living Arrangements: Children Available Help at Discharge: Family;Friend(s);Available PRN/intermittently Type of Home: House Home Access: Stairs to enter Entrance Stairs-Rails: Can reach both Entrance Stairs-Number of Steps: 3-4   Home Layout: One level Home Equipment: Merchant navy officer (4 wheels);Tub bench      Prior Function Prior Level of Function : Independent/Modified Independent;Driving             Mobility Comments: uses rollator at baseline for household ambulation ADLs Comments: son does majority of cooking, cleaning, and shopping; pt mod independent for self care     Hand Dominance   Dominant Hand: Right    Extremity/Trunk Assessment   Upper Extremity Assessment Upper Extremity Assessment: Generalized weakness (able to don socks mod independent)    Lower Extremity Assessment Lower Extremity Assessment: Overall WFL for tasks assessed (functional AROM during bed mobility)    Cervical / Trunk Assessment Cervical / Trunk Assessment: Normal  Communication   Communication: No difficulties  Cognition Arousal/Alertness: Awake/alert Behavior During Therapy: WFL for tasks assessed/performed Overall Cognitive Status: No family/caregiver present to determine baseline cognitive functioning                                 General Comments: oriented to person place and time, able to multi task with conversation during mobility and provided insight about how to safely navigate home environment. Several comments bordering inappropriateness, likely baseline personality but difficult to truly determine        General Comments General comments (skin integrity, edema, etc.): SpO2 >92% on RA; HR 80-90s during mobility    Exercises     Assessment/Plan    PT Assessment  Patient does not need any further PT services  PT Problem List         PT Treatment Interventions      PT Goals (Current goals can be found in the Care Plan section)  Acute Rehab PT Goals Patient Stated Goal: to go home PT Goal Formulation: With patient Time For Goal Achievement: 07/11/22 Potential to Achieve Goals: Good    Frequency       Co-evaluation               AM-PAC PT "6 Clicks" Mobility  Outcome Measure Help needed turning from your back to your side while in a flat bed without using bedrails?: None Help needed moving from lying on your back to sitting on the side of a flat bed without using bedrails?: None Help needed moving to and from a bed to a chair (including a wheelchair)?: A Little Help needed standing up from a chair using your arms (e.g., wheelchair or bedside chair)?: A Little Help needed to walk in hospital room?: A Little Help needed climbing 3-5 steps with a railing? : A Little 6 Click Score: 20    End of Session Equipment Utilized During Treatment: Gait belt Activity Tolerance: Patient tolerated treatment well Patient left: in chair;with call bell/phone within reach Nurse Communication: Mobility status      Time: 4854-6270 PT Time Calculation (min) (ACUTE ONLY): 30 min   Charges:   PT Evaluation $PT Eval Moderate Complexity: 1 Mod PT Treatments $Therapeutic Exercise: 8-22 mins  Amber Cross, Amber Cross   Amber Cross 06/27/2022, 10:18 AM

## 2022-06-27 NOTE — Progress Notes (Signed)
HD#11 Subjective:  Patient summary: Amber Cross is a 61 year old female living with HFrEF, type 2 diabetes, small cell lung cancer, CKD 4 who presented with acute on chronic hypoxic respiratory failure secondary to moderate-large pericardial effusion with cardiac tamponade.  Patient underwent subxiphoid pericardiocentesis on 11/21 which collected about 5 cc of clear fluid.  Cytology of the pleural and pericardial fluid were negative for malignancy.  Patient was observed in the ICU after procedure, and slowly was downgraded to progressive.  Current concern, include worsening renal disease requiring dialysis.  Overnight events: No overnight events reports   Patient patient seen sitting on recliner today.  Patient is intubated.  She has no concerns today.  She reports that she has spoken with the vascular surgeon as well as nephrologist.  She understands that she has to go on dialysis.  She states that she is willing to do dialysis.  Patient denies any chest pain, shortness of breath, or any abdominal pain.  Patient does report that she has not had any bowel movements in the past couple days.  She does report she is passing gas.  Patient has no other concerns today.  Objective:  Vital signs in last 24 hours: Vitals:   06/26/22 1900 06/26/22 1932 06/26/22 2300 06/27/22 0400  BP: 138/73 (!) 150/63 (!) 151/72 132/63  Pulse: 72 70 73 71  Resp: 15 16 15 13   Temp:  (!) 97.4 F (36.3 C) 97.8 F (36.6 C) 97.6 F (36.4 C)  TempSrc:  Oral Axillary Oral  SpO2: 94% 94% 93% 92%  Weight:    (P) 68.5 kg  Height:       Supplemental O2: Room Air 92%    Physical Exam:  Physical Exam  General: Patient resting comfortably in recliner in no acute distress Head: Normocephalic, atraumatic  Cardio: Regular rate and rhythm, no murmurs, rubs or gallops  Pulmonary: Clear to ausculation bilaterally with no rales, rhonchi, and crackles.  Patient with chest tube in place, draining well with warm fluid  noted in container. Abdomen: Soft, nontender with normoactive bowel sounds MSK: 5/5 strength to upper and lower extremities.   Filed Weights   06/25/22 0655 06/26/22 0300 06/27/22 0400  Weight: 69.2 kg 64.5 kg (P) 68.5 kg     Intake/Output Summary (Last 24 hours) at 06/27/2022 0734 Last data filed at 06/27/2022 0600 Gross per 24 hour  Intake 730 ml  Output 1800 ml  Net -1070 ml   Net IO Since Admission: 616.8 mL [06/27/22 0734]  Pertinent Labs:    Latest Ref Rng & Units 06/27/2022   12:26 AM 06/26/2022   12:24 AM 06/25/2022   12:12 AM  CBC  WBC 4.0 - 10.5 K/uL 7.8  7.1  8.9   Hemoglobin 12.0 - 15.0 g/dL 8.8  8.2  9.0   Hematocrit 36.0 - 46.0 % 27.4  26.3  27.8   Platelets 150 - 400 K/uL 370  313  283        Latest Ref Rng & Units 06/27/2022   12:26 AM 06/26/2022   12:24 AM 06/25/2022   12:12 AM  CMP  Glucose 70 - 99 mg/dL 134  125  145   BUN 6 - 20 mg/dL 75  73  72   Creatinine 0.44 - 1.00 mg/dL 6.79  6.59  6.71   Sodium 135 - 145 mmol/L 137  139  137   Potassium 3.5 - 5.1 mmol/L 4.6  4.5  4.5   Chloride 98 - 111 mmol/L 107  111  110   CO2 22 - 32 mmol/L 19  19  17    Calcium 8.9 - 10.3 mg/dL 8.2  7.8  7.9     Imaging: IR Fluoro Guide CV Line Right  Result Date: 06/26/2022 INDICATION: Acute kidney injury on chronic kidney disease. Catheter needed for hemodialysis. EXAM: FLUOROSCOPIC AND ULTRASOUND GUIDED PLACEMENT OF A TUNNELED DIALYSIS CATHETER Physician: Stephan Minister. Anselm Pancoast, MD MEDICATIONS: Vancomycin 1 g; The antibiotic was administered within an appropriate time interval prior to skin puncture. ANESTHESIA/SEDATION: Moderate (conscious) sedation was employed during this procedure. A total of Versed 0.5mg  and fentanyl 25 mcg was administered intravenously at the order of the provider performing the procedure. Total intra-service moderate sedation time: 20 minutes. Patient's level of consciousness and vital signs were monitored continuously by radiology nurse throughout  the procedure under the supervision of the provider performing the procedure. FLUOROSCOPY TIME:  Radiation Exposure Index (as provided by the fluoroscopic device): 3.3 mGy Kerma COMPLICATIONS: None immediate. PROCEDURE: Informed consent was obtained for placement of a tunneled dialysis catheter. The patient was placed supine on the interventional table. Ultrasound confirmed a patent right internal jugular vein. Ultrasound image obtained for documentation. The right neck and chest was prepped and draped in a sterile fashion. Maximal barrier sterile technique was utilized including caps, mask, sterile gowns, sterile gloves, sterile drape, hand hygiene and skin antiseptic. The right neck was anesthetized with 1% lidocaine. A small incision was made with #11 blade scalpel. A 21 gauge needle directed into the right internal jugular vein with ultrasound guidance. A micropuncture dilator set was placed. A 19 cm tip to cuff Palindrome catheter was selected. The skin below the right clavicle was anesthetized and a small incision was made with an #11 blade scalpel. A subcutaneous tunnel was formed to the vein dermatotomy site. The catheter was brought through the tunnel. The vein dermatotomy site was dilated to accommodate a peel-away sheath. The catheter was placed through the peel-away sheath and directed into the central venous structures. The tip of the catheter was placed at superior cavoatrial junction with fluoroscopy. Fluoroscopic images were obtained for documentation. Both lumens were found to aspirate and flush well. The proper amount of heparin was flushed in both lumens. The vein dermatotomy site was closed using a single layer of absorbable suture and Dermabond. The catheter was secured to the skin using Prolene suture. IMPRESSION: Successful placement of a right jugular tunneled dialysis catheter using ultrasound and fluoroscopic guidance. Electronically Signed   By: Markus Daft M.D.   On: 06/26/2022 16:18    IR US Guide Vasc Access Right  Result Date: 06/26/2022 INDICATION: Acute kidney injury on chronic kidney disease. Catheter needed for hemodialysis. EXAM: FLUOROSCOPIC AND ULTRASOUND GUIDED PLACEMENT OF A TUNNELED DIALYSIS CATHETER Physician: Stephan Minister. Anselm Pancoast, MD MEDICATIONS: Vancomycin 1 g; The antibiotic was administered within an appropriate time interval prior to skin puncture. ANESTHESIA/SEDATION: Moderate (conscious) sedation was employed during this procedure. A total of Versed 0.5mg  and fentanyl 25 mcg was administered intravenously at the order of the provider performing the procedure. Total intra-service moderate sedation time: 20 minutes. Patient's level of consciousness and vital signs were monitored continuously by radiology nurse throughout the procedure under the supervision of the provider performing the procedure. FLUOROSCOPY TIME:  Radiation Exposure Index (as provided by the fluoroscopic device): 3.3 mGy Kerma COMPLICATIONS: None immediate. PROCEDURE: Informed consent was obtained for placement of a tunneled dialysis catheter. The patient was placed supine on the interventional table. Ultrasound confirmed a patent right  internal jugular vein. Ultrasound image obtained for documentation. The right neck and chest was prepped and draped in a sterile fashion. Maximal barrier sterile technique was utilized including caps, mask, sterile gowns, sterile gloves, sterile drape, hand hygiene and skin antiseptic. The right neck was anesthetized with 1% lidocaine. A small incision was made with #11 blade scalpel. A 21 gauge needle directed into the right internal jugular vein with ultrasound guidance. A micropuncture dilator set was placed. A 19 cm tip to cuff Palindrome catheter was selected. The skin below the right clavicle was anesthetized and a small incision was made with an #11 blade scalpel. A subcutaneous tunnel was formed to the vein dermatotomy site. The catheter was brought through the tunnel.  The vein dermatotomy site was dilated to accommodate a peel-away sheath. The catheter was placed through the peel-away sheath and directed into the central venous structures. The tip of the catheter was placed at superior cavoatrial junction with fluoroscopy. Fluoroscopic images were obtained for documentation. Both lumens were found to aspirate and flush well. The proper amount of heparin was flushed in both lumens. The vein dermatotomy site was closed using a single layer of absorbable suture and Dermabond. The catheter was secured to the skin using Prolene suture. IMPRESSION: Successful placement of a right jugular tunneled dialysis catheter using ultrasound and fluoroscopic guidance. Electronically Signed   By: Markus Daft M.D.   On: 06/26/2022 16:18   CT [facility] PLEURAL DRAIN W/INDWELL CATH W/IMG GUIDE  Result Date: 06/26/2022 INDICATION: 61 year old with a recurrent right pleural effusion. Request for chest tube placement. EXAM: CT-GUIDED PLACEMENT OF RIGHT CHEST TUBE MEDICATIONS: Moderate sedation ANESTHESIA/SEDATION: Moderate (conscious) sedation was employed during this procedure. A total of Versed 1.0mg  and fentanyl 50 mcg was administered intravenously at the order of the provider performing the procedure. Total intra-service moderate sedation time: 18 minutes. Patient's level of consciousness and vital signs were monitored continuously by radiology nurse throughout the procedure under the supervision of the provider performing the procedure. COMPLICATIONS: None immediate. PROCEDURE: Informed written consent was obtained from the patient after a thorough discussion of the procedural risks, benefits and alternatives. All questions were addressed. Maximal Sterile Barrier Technique was utilized including caps, mask, sterile gowns, sterile gloves, sterile drape, hand hygiene and skin antiseptic. A timeout was performed prior to the initiation of the procedure. Patient was placed supine on CT scanner with the  right side slightly elevated. CT images through the chest were obtained. The moderate sized right pleural effusion was identified. The posterolateral aspect of the right chest was marked and this area was prepped with chlorhexidine. Sterile field was created. Using CT guidance, a Yueh catheter was directed in the pleural space and yellow fluid was aspirated. Wire was advanced into the pleural space and the tract was dilated to accommodate a 14 [place] multipurpose drain. Drain was sutured to skin and attached to a chest tube drainage system. Bandage was placed. RADIATION DOSE REDUCTION: This exam was performed according to the departmental dose-optimization program which includes automated exposure control, adjustment of the mA and/or kV according to patient size and/or use of iterative reconstruction technique. FINDINGS: Moderate sized right pleural effusion. 58 French drain was placed within the right pleural space. Trace pleural air present following chest tube placement. IMPRESSION: Successful CT-guided placement of right chest tube. Electronically Signed   By: Markus Daft M.D.   On: 06/26/2022 16:13    Assessment/Plan:   Principal Problem:   Tamponade Active Problems:   Type 2 diabetes mellitus  with diabetic polyneuropathy (HCC)   Acute on chronic systolic congestive heart failure (HCC)   Small cell lung cancer, right upper lobe ([place])   AKI (acute kidney injury) ([place])   Hypertensive emergency   Patient Summary: Amber Cross is a 61 y.o. female living with HFrEF, type 2 diabetes, small cell lung cancer, CKD 4 who presented with acute on chronic hypoxic respiratory failure secondary to moderate-large pericardial effusion with cardiac tamponade.  Patient underwent subxiphoid pericardiocentesis on 11/21 which collected about 5 cc of clear fluid.  Cytology of the pleural and pericardial fluid were negative for malignancy.  Patient was observed in the ICU after procedure, and slowly was downgraded to  progressive.  Current concern, include worsening renal disease requiring dialysis.  #Acute hypoxic respiratory failure, resolved  #Cardiac tamponade s/p cardio window Patient is status post pericardial window.  Patient respiratory status is back to baseline.  Patient is not on any oxygen and satting well on room air.  Patient's blood pressures are stable, and patient's rate is stable as well.  On exam, patient denies any shortness of breath, and has clear breath sounds bilaterally.  Patient still has right pleural effusion. Patient does have right chest tube in stable, draining well.  Plan for hemodialysis today -Continue to monitor respiratory status -Appreciate CT surgery, cardiology, and nephrology recommendation -Can give supplemental oxygen to keep SpO2 greater than 92%  #Right pleural effusion  X-ray today shows right pleural effusion with small right apical pneumothorax.  Patient has had trial of diuresing, with minimal improvement.  Chest tube had to be placed, and is draining well.  Pleural effusion will likely improve with hemodialysis. -Continue to monitor respiratory status -Repeat chest x-ray pending  #HFrEF #Hypertension Patient has had minimal output with Lasix.  Given CKD 5, GDMT is not able to be optimized. Blood pressures have been stable, with no concerns at this time. Continue with current medication.  -HD today  -Continue Coreg 12.5, Imdur and hydralazine.  Blood pressure is within normal limits.  # CKD 5, approaching ESRD #Nephrotic range proteinuria #Non anion gapped metabolic acidosis likely in the setting of CKD 5  Patient has had nephrotic range proteinuria, now possibly in combination with cardiorenal. Past biopsy shows severe interstitial fibrosis and tubular atrophy. Given the degree of her scarring, less chance of recovery.  Her kidney function is stable but has not improved for the last several days. Patient had RIJ tunneled catheter placed yesterday.  Plan for  HD today.  Vascular surgeon was consulted today as well in which patient will get AV fistula tomorrow. -HD today -AV fistula tomorrow -N.p.o. at midnight -Continue to follow renal function panel  #Small right apical pneumothorax On chest x-ray today, there is a small right apical pneumothorax.  Patient has no clinical symptoms of decline.  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  Patient has had radiation treatment in the past. Patient did have CT chest in the hospital in which the nodule was not visible given other overlying airspace opacities.  Patient is due for MRI brain in December, can do outpatient. Her oncology team recommended repeat CT chest in 3-6 months for the first year and then every 6 months until 5 years disease-free mark.   -Follow-up with oncology outpatient -Repeat MRI brain and CT chest in December  #Anemia of Chronic disease  In the setting of chronic kidney disease.  Hgb stable at 8.8. -Continue to monitor -Transfuse if less than 7  #Urinary retention Patient has a foley cath placed since her hospitalization at [facility].  Will keep it in for now to monitor urine output.  Can consider voiding trial after dialysis -Follow-up with urology outpatient  #Type 2 diabetes A1c of 9.7 in July.  CBG ranges between 115-149.  -Continue sliding scale insulin for now.  She only required 6 units yesterday. -Continue gabapentin -A1C pending   Diet: Carb/Renal IVF: None,None VTE: Heparin Code: Full PT/OT recs: Pending   Dispo: Anticipated discharge to Home in 3 days pending initiating dialysis.   Leigh Aurora, DO 06/27/2022, 7:34 AM Pager: [phone]  Please contact the on call pager after 5 pm and on weekends at [phone].

## 2022-06-27 NOTE — Consult Note (Signed)
[facility] [place]  ASSESSMENT / PLAN: Amber Cross is a 61 y.o. right handed female in need of permanent dialysis access. I reviewed options for dialysis in detail with the patient, including hemodialysis and peritoneal dialysis. I counseled the patient to ask their nephrologist about their candidacy for renal transplant. I counseled the patient that dialysis access requires surveillance and periodic maintenance. Plan to proceed with left arteriovenous fistula vs. Graft tomorrow in OR pending vein mapping findings. NPO after midnight. Restrict left upper extremity.   CHIEF COMPLAINT: worsening renal function  HISTORY OF PRESENT ILLNESS: Amber Cross is a 61 y.o. female admitted to the medicine service for acute on chronic respiratory failure. She has undergone chest tube placement for right pleural effusion which continues to be quite productive.  She is a baseline creatinine in the 3-3.3 range an established diagnosis of chronic kidney disease stage IV.  A tunneled dialysis catheter was placed and she is due to start dialysis today.  Vascular consultation was requested for permanent dialysis access.  The patient reports to me she has never had a pacemaker or port for chemotherapy, but did recently complete treatment for small cell lung cancer.  She denies any upper extremity surgery or trauma.  She is right-hand dominant.  Past Medical History:  Diagnosis Date   Anemia    Arthritis    CHF (congestive heart failure) ([place])    Chronic kidney disease    Depression    Diabetes mellitus    Headache    Hypercholesteremia    Hypertension    Pseudogout    Stroke ([place]) 10/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   Tobacco abuse     Past Surgical History:  Procedure Laterality Date   ANTERIOR CRUCIATE LIGAMENT REPAIR Right    BRONCHIAL BIOPSY  12/18/2021   Procedure: BRONCHIAL BIOPSIES;  Surgeon: Garner Nash, DO;  Location: [facility];  Service: Pulmonary;;   BRONCHIAL  BRUSHINGS  12/18/2021   Procedure: BRONCHIAL BRUSHINGS;  Surgeon: Garner Nash, DO;  Location: [facility];  Service: Pulmonary;;   BRONCHIAL NEEDLE ASPIRATION BIOPSY  12/18/2021   Procedure: BRONCHIAL NEEDLE ASPIRATION BIOPSIES;  Surgeon: Garner Nash, DO;  Location: [facility];  Service: Pulmonary;;   CATARACT EXTRACTION     CHEST TUBE INSERTION Right 06/18/2022   Procedure: CHEST TUBE INSERTION;  Surgeon: Dahlia Byes, MD;  Location: [facility];  Service: Thoracic;  Laterality: Right;   FIDUCIAL MARKER PLACEMENT  12/18/2021   Procedure: FIDUCIAL MARKER PLACEMENT;  Surgeon: Garner Nash, DO;  Location: [facility];  Service: Pulmonary;;   IR FLUORO GUIDE CV LINE RIGHT  06/26/2022   IR US GUIDE VASC ACCESS RIGHT  06/26/2022   OVARY SURGERY     RIGHT OOPHORECTOMY Right    SMALL INTESTINE SURGERY     SUBXYPHOID PERICARDIAL WINDOW N/A 06/18/2022   Procedure: SUBXYPHOID PERICARDIAL WINDOW;  Surgeon: Dahlia Byes, MD;  Location: [facility];  Service: Thoracic;  Laterality: N/A;   TEE WITHOUT CARDIOVERSION N/A 06/18/2022   Procedure: TRANSESOPHAGEAL ECHOCARDIOGRAM (TEE);  Surgeon: Dahlia Byes, MD;  Location: [facility];  Service: Thoracic;  Laterality: N/A;   TUBAL LIGATION     VIDEO BRONCHOSCOPY WITH RADIAL ENDOBRONCHIAL ULTRASOUND  12/18/2021   Procedure: RADIAL ENDOBRONCHIAL ULTRASOUND;  Surgeon: Garner Nash, DO;  Location: [facility];  Service: Pulmonary;;    Family History  Problem Relation Age of Onset   Diabetes Mother    Hypertension Mother    Hypertension Father  Social History   Socioeconomic History   Marital status: Married    Spouse name: Not on file   Number of children: Not on file   Years of education: Not on file   Highest education level: Not on file  Occupational History   Not on file  Tobacco Use   Smoking status: Every Day    Packs/day: 0.50    Years: 40.00    Total pack years: 20.00    Types: Cigarettes   Smokeless tobacco: Never   Vaping Use   Vaping Use: Never used  Substance and Sexual Activity   Alcohol use: Not Currently   Drug use: Yes    Frequency: 4.0 times per week    Types: Marijuana    Comment: smokes every other day   Sexual activity: Not Currently    Birth control/protection: Abstinence  Other Topics Concern   Not on file  Social History Narrative   Not on file   Social Determinants of Health   Financial Resource Strain: Medium Risk (02/28/2022)   Overall Financial Resource Strain (CARDIA)    Difficulty of Paying Living Expenses: Somewhat hard  Food Insecurity: No Food Insecurity (06/16/2022)   Hunger Vital Sign    Worried About Running Out of Food in the Last Year: Never true    Ran Out of Food in the Last Year: Never true  Transportation Needs: Unmet Transportation Needs (06/16/2022)   PRAPARE - Transportation    Lack of Transportation (Medical): Yes    Lack of Transportation (Non-Medical): Yes  Physical Activity: Not on file  Stress: Stress Concern Present (02/28/2022)   [employer]    Feeling of Stress : To some extent  Social Connections: Not on file  Intimate Partner Violence: Not At Risk (06/16/2022)   Humiliation, Afraid, Rape, and Kick questionnaire    Fear of Current or Ex-Partner: No    Emotionally Abused: No    Physically Abused: No    Sexually Abused: No    Allergies  Allergen Reactions   Penicillins Itching and Other (See Comments)    redness   Strawberry (Diagnostic) Itching and Swelling   Tomato Itching and Swelling    Current Facility-Administered Medications  Medication Dose Route Frequency Provider Last Rate Last Admin   alteplase (CATHFLO ACTIVASE) injection 2 mg  2 mg Intracatheter Once PRN Claudia Desanctis, MD       anticoagulant sodium citrate solution 5 mL  5 mL Intracatheter PRN Claudia Desanctis, MD       atorvastatin (LIPITOR) tablet 80 mg  80 mg Oral Daily Barrett, Erin R, PA-C   80 mg at  06/27/22 0950   bisacodyl (DULCOLAX) EC tablet 10 mg  10 mg Oral Daily Stehler, Mel Almond C, PA-C   10 mg at 06/27/22 0953   carvedilol (COREG) tablet 12.5 mg  12.5 mg Oral BID WC Reesa Chew, MD   12.5 mg at 06/27/22 0950   Chlorhexidine Gluconate Cloth 2 % PADS 6 each  6 each Topical Daily Dahlia Byes, MD   6 each at 06/26/22 0922   Chlorhexidine Gluconate Cloth 2 % PADS 6 each  6 each Topical Q0600 Claudia Desanctis, MD   6 each at 06/27/22 0600   gabapentin (NEURONTIN) capsule 100 mg  100 mg Oral QHS Wynelle Beckmann C, PA-C   100 mg at 06/25/22 2205   heparin injection 1,000 Units  1,000 Units Intracatheter PRN Claudia Desanctis, MD  heparin injection 5,000 Units  5,000 Units Subcutaneous Q12H Monia Sabal, PA-C   5,000 Units at 06/27/22 2979   hydrALAZINE (APRESOLINE) tablet 50 mg  50 mg Oral Q12H Geralynn Rile, MD   50 mg at 06/27/22 0949   insulin aspart (novoLOG) injection 0-9 Units  0-9 Units Subcutaneous TID WC & HS Dahlia Byes, MD   3 Units at 06/26/22 2201   isosorbide mononitrate (IMDUR) 24 hr tablet 30 mg  30 mg Oral Daily Geralynn Rile, MD   30 mg at 06/27/22 0949   lidocaine (XYLOCAINE) 1 % (with pres) injection 10 mL  10 mL Intradermal Once Markus Daft, MD       metoCLOPramide (REGLAN) injection 10 mg  10 mg Intravenous Q12H Dahlia Byes, MD   10 mg at 06/27/22 0950   nitroGLYCERIN (NITROSTAT) SL tablet 0.4 mg  0.4 mg Sublingual Q5 min PRN Magdalene River, PA-C       ondansetron [facility]) injection 4 mg  4 mg Intravenous Q6H PRN Dahlia Byes, MD   4 mg at 06/26/22 1321   Oral care mouth rinse  15 mL Mouth Rinse PRN Dahlia Byes, MD       oxyCODONE (Oxy IR/ROXICODONE) immediate release tablet 5 mg  5 mg Oral Q4H PRN Dahlia Byes, MD   5 mg at 06/26/22 2158   pantoprazole (PROTONIX) EC tablet 40 mg  40 mg Oral Daily Stehler, Pricilla Larsson, PA-C   40 mg at 06/27/22 0950   promethazine (PHENERGAN) 6.25 mg in sodium chloride 0.9 % 50 mL IVPB  6.25 mg  Intravenous Q6H PRN Dahlia Byes, MD       senna-docusate (Senokot-S) tablet 1 tablet  1 tablet Oral QHS Magdalene River, PA-C   1 tablet at 06/26/22 2200   sevelamer carbonate (RENVELA) tablet 1,600 mg  1,600 mg Oral TID WC Claudia Desanctis, MD   1,600 mg at 06/27/22 0950   traZODone (DESYREL) tablet 50 mg  50 mg Oral QHS PRN Wynelle Beckmann C, PA-C   50 mg at 06/26/22 2327    PHYSICAL EXAM Vitals:   06/26/22 1932 06/26/22 2300 06/27/22 0400 06/27/22 0749  BP: (!) 150/63 (!) 151/72 132/63 (!) 180/74  Pulse: 70 73 71 72  Resp: 16 15 13 18   Temp: (!) 97.4 F (36.3 C) 97.8 F (36.6 C) 97.6 F (36.4 C) 97.6 F (36.4 C)  TempSrc: Oral Axillary Oral Oral  SpO2: 94% 93% 92% 93%  Weight:   (P) 68.5 kg   Height:       Chronically ill woman in no acute distress Regular rate and rhythm Unlabored breathing 2+ radial pulse on the left Antecubital fossa peripheral IV  PERTINENT LABORATORY AND RADIOLOGIC DATA  Most recent CBC    Latest Ref Rng & Units 06/27/2022   12:26 AM 06/26/2022   12:24 AM 06/25/2022   12:12 AM  CBC  WBC 4.0 - 10.5 K/uL 7.8  7.1  8.9   Hemoglobin 12.0 - 15.0 g/dL 8.8  8.2  9.0   Hematocrit 36.0 - 46.0 % 27.4  26.3  27.8   Platelets 150 - 400 K/uL 370  313  283      Most recent CMP    Latest Ref Rng & Units 06/27/2022   12:26 AM 06/26/2022   12:24 AM 06/25/2022   12:12 AM  CMP  Glucose 70 - 99 mg/dL 134  125  145   BUN 6 - 20 mg/dL 75  73  72  Creatinine 0.44 - 1.00 mg/dL 6.79  6.59  6.71   Sodium 135 - 145 mmol/L 137  139  137   Potassium 3.5 - 5.1 mmol/L 4.6  4.5  4.5   Chloride 98 - 111 mmol/L 107  111  110   CO2 22 - 32 mmol/L 19  19  17    Calcium 8.9 - 10.3 mg/dL 8.2  7.8  7.9     Renal function Estimated Creatinine Clearance: 8.6 mL/min (A) (by C-G formula based on SCr of 6.79 mg/dL (H)).  HbA1c POC (<> result, manual entry) (%)  Date Value  10/24/2020 >14.0 (A)   Hgb A1c MFr Bld (%)  Date Value  01/28/2022 9.7 (H)    LDL  Cholesterol  Date Value Ref Range Status  01/28/2022 143 (H) 0 - 99 mg/dL Final    Comment:           Total Cholesterol/HDL:CHD Risk Coronary Heart Disease Risk Table                     Men   Women  1/2 Average Risk   3.4   3.3  Average Risk       5.0   4.4  2 X Average Risk   9.6   7.1  3 X Average Risk  23.4   11.0        Use the calculated Patient Ratio above and the CHD Risk Table to determine the patient's CHD Risk.        ATP III CLASSIFICATION (LDL):  <100     mg/dL   Optimal  100-129  mg/dL   Near or Above                    Optimal  130-159  mg/dL   Borderline  160-189  mg/dL   High  >190     mg/dL   Very High Performed at [facility] [address], [place], [place] [address]     Yevonne Aline. Stanford Breed, MD FACS [facility] Phone Number: [phone] 06/27/2022 10:23 AM   Total time spent on preparing this encounter including chart review, data review, collecting history, examining the patient, coordinating care for this new patient, 60 minutes.  Portions of this report may have been transcribed using voice recognition software.  Every effort has been made to ensure accuracy; however, inadvertent computerized transcription errors may still be present.

## 2022-06-27 NOTE — Progress Notes (Signed)
Nephrology Follow-Up note   Assessment/Recommendations: Amber Cross is a/an 61 y.o. female with a past medical history significant for HFrEF, HTN, DM2, small cell lung cancer, chronic hypoxic respiratory failure, and CKD IV who presents with acute on chronic hypoxic respiratory failure   # AKI on CKD 4: Likely secondary to hemodynamic mediated process possibly some degree of cardiorenal syndrome and possibly some ATN.  Significant CKD related to diabetes (biopsy proven) with severe interstitial fibrosis and tubular atrophy.  Less likely to recover.  She has had a pericardial effusion and pleural effusion.  RIJ tunneled dialysis catheter placed on 11/29 with IR.   ------------------------ - Start HD today  - Plan for HD on 12/1 (and 12/2 if staffing permits) - Maintain DJM>42 for optimal renal perfusion.  - Feel she is less likely to recover - Will consult [facility] for AVF    # Acute on chronic hypoxic respiratory failure: Continue with volume optimization as desired based on cardiac status.  Supplemental oxygen as needed.  Overall improved  # Right pleural effusion  - s/p chest tube with IR on 11/29   # Pericardial effusion: Status post pericardial window on 06/18/2022.  Cytology negative for malignancy. Cardiothoracic surgery following.   # CKD stage IV  - note her baseline Cr is 3-3.3 per charting and with severe interstitial fibrosis and tubular atrophy on past biopsy  # Hypertension: Acceptable on current regimen    # HFrEF:  has had minimal urine output and minimal response to lasix IV.  Starting HD as above    # Metabolic acidosis: likely 2/2 CKD.  Stop oral bicarb.  Starting HD as above   # Anemia - multifactorial. Iron sat 29.  - Avoiding ESA given malignancy. Transfusion as needed  # Small cell lung cancer: Radiation finished in October.  Per charting she is planning to see oncology in December for restaging.  Would consider reaching out to oncology    # Hyperphosphatemia -  increased renvela.  Starting HD  Disposition - continue inpatient monitoring   ___________________________________________________________  CC: shortness of breath   Interval History/Subjective:  She had 300 mL UOP over 11/29.  She had a RIJ tunneled dialysis catheter placed on 11/29 with IR.  They went ahead and took her down as she also had a right chest tube placed. She is ok with me calling [facility] to see her about scheduling an AV fistula.    Review of systems:    She denies any shortness of breath at rest or exertion  Denies chest pain  Denies n/v  Medications:  Current Facility-Administered Medications  Medication Dose Route Frequency Provider Last Rate Last Admin   atorvastatin (LIPITOR) tablet 80 mg  80 mg Oral Daily Barrett, Erin R, PA-C   80 mg at 06/26/22 0914   bisacodyl (DULCOLAX) EC tablet 10 mg  10 mg Oral Daily Stehler, Mel Almond C, PA-C   10 mg at 06/26/22 0914   carvedilol (COREG) tablet 12.5 mg  12.5 mg Oral BID WC Reesa Chew, MD   12.5 mg at 06/26/22 1739   Chlorhexidine Gluconate Cloth 2 % PADS 6 each  6 each Topical Daily Dahlia Byes, MD   6 each at 06/26/22 0922   Chlorhexidine Gluconate Cloth 2 % PADS 6 each  6 each Topical Q0600 Claudia Desanctis, MD   6 each at 06/27/22 0600   gabapentin (NEURONTIN) capsule 100 mg  100 mg Oral QHS Wynelle Beckmann C, PA-C   100 mg at 06/25/22 2205  heparin injection 5,000 Units  5,000 Units Subcutaneous Q12H Monia Sabal, PA-C       hydrALAZINE (APRESOLINE) tablet 50 mg  50 mg Oral Q12H O'Neal, Cassie Freer, MD   50 mg at 06/26/22 1747   insulin aspart (novoLOG) injection 0-9 Units  0-9 Units Subcutaneous TID WC & HS Dahlia Byes, MD   3 Units at 06/26/22 2201   isosorbide mononitrate (IMDUR) 24 hr tablet 30 mg  30 mg Oral Daily Geralynn Rile, MD   30 mg at 06/26/22 0914   lidocaine (XYLOCAINE) 1 % (with pres) injection 10 mL  10 mL Intradermal Once Markus Daft, MD       metoCLOPramide (REGLAN) injection 10 mg   10 mg Intravenous Q12H Dahlia Byes, MD   10 mg at 06/26/22 2200   nitroGLYCERIN (NITROSTAT) SL tablet 0.4 mg  0.4 mg Sublingual Q5 min PRN Magdalene River, PA-C       ondansetron [facility]) injection 4 mg  4 mg Intravenous Q6H PRN Dahlia Byes, MD   4 mg at 06/26/22 1321   Oral care mouth rinse  15 mL Mouth Rinse PRN Dahlia Byes, MD       oxyCODONE (Oxy IR/ROXICODONE) immediate release tablet 5 mg  5 mg Oral Q4H PRN Dahlia Byes, MD   5 mg at 06/26/22 2158   pantoprazole (PROTONIX) EC tablet 40 mg  40 mg Oral Daily Magdalene River, PA-C   40 mg at 06/26/22 0914   promethazine (PHENERGAN) 6.25 mg in sodium chloride 0.9 % 50 mL IVPB  6.25 mg Intravenous Q6H PRN Dahlia Byes, MD       senna-docusate (Senokot-S) tablet 1 tablet  1 tablet Oral QHS Magdalene River, PA-C   1 tablet at 06/26/22 2200   sevelamer carbonate (RENVELA) tablet 1,600 mg  1,600 mg Oral TID WC Claudia Desanctis, MD   1,600 mg at 06/26/22 1739   sodium bicarbonate tablet 1,300 mg  1,300 mg Oral BID Claudia Desanctis, MD   1,300 mg at 06/26/22 2200   traZODone (DESYREL) tablet 50 mg  50 mg Oral QHS PRN Magdalene River, PA-C   50 mg at 06/26/22 2327      Physical Exam: Vitals:   06/26/22 2300 06/27/22 0400  BP: (!) 151/72 132/63  Pulse: 73 71  Resp: 15 13  Temp: 97.8 F (36.6 C) 97.6 F (36.4 C)  SpO2: 93% 92%   Total I/O In: 240 [P.O.:240] Out: 250 [Chest Tube:250]  Intake/Output Summary (Last 24 hours) at 06/27/2022 0657 Last data filed at 06/27/2022 0600 Gross per 24 hour  Intake 730 ml  Output 1550 ml  Net -820 ml    General adult female in bed in no acute distress     HEENT normocephalic atraumatic extraocular movements intact sclera anicteric Neck supple trachea midline Lungs clear but reduced to auscultation bilaterally normal work of breathing at rest; room air Heart S1S2  Abdomen soft nontender nondistended Extremities 2-3+ edema bilateral lower extremities Psych normal mood and  affect Neuro oriented to person, location, and time and is conversant.   Test Results I personally reviewed new and old clinical labs and radiology tests Lab Results  Component Value Date   NA 137 06/27/2022   K 4.6 06/27/2022   CL 107 06/27/2022   CO2 19 (L) 06/27/2022   BUN 75 (H) 06/27/2022   CREATININE 6.79 (H) 06/27/2022   CALCIUM 8.2 (L) 06/27/2022   ALBUMIN 2.4 (L) 06/27/2022   PHOS 7.1 (H)  06/27/2022    CBC Recent Labs  Lab 06/25/22 0012 06/26/22 0024 06/27/22 0026  WBC 8.9 7.1 7.8  HGB 9.0* 8.2* 8.8*  HCT 27.8* 26.3* 27.4*  MCV 88.0 88.9 87.8  PLT 283 313 370    Claudia Desanctis, MD 7:17 AM 06/27/2022

## 2022-06-28 ENCOUNTER — Inpatient Hospital Stay (HOSPITAL_COMMUNITY): Payer: Commercial Managed Care - HMO

## 2022-06-28 ENCOUNTER — Ambulatory Visit (HOSPITAL_COMMUNITY): Payer: No Typology Code available for payment source

## 2022-06-28 ENCOUNTER — Encounter (HOSPITAL_COMMUNITY)
Admission: EM | Disposition: A | Discharge status: Home / Self Care | Payer: Self-pay | Source: Home / Self Care | Attending: Cardiothoracic Surgery

## 2022-06-28 DIAGNOSIS — I314 Cardiac tamponade: Secondary | ICD-10-CM | POA: Diagnosis not present

## 2022-06-28 DIAGNOSIS — N185 Chronic kidney disease, stage 5: Secondary | ICD-10-CM | POA: Diagnosis not present

## 2022-06-28 LAB — RENAL FUNCTION PANEL
Albumin: 2.2 g/dL — ABNORMAL LOW (ref 3.5–5.0)
Anion gap: 9 (ref 5–15)
BUN: 55 mg/dL — ABNORMAL HIGH (ref 6–20)
CO2: 21 mmol/L — ABNORMAL LOW (ref 22–32)
Calcium: 7.7 mg/dL — ABNORMAL LOW (ref 8.9–10.3)
Chloride: 106 mmol/L (ref 98–111)
Creatinine, Ser: 5.16 mg/dL — ABNORMAL HIGH (ref 0.44–1.00)
GFR, Estimated: 9 mL/min — ABNORMAL LOW (ref >60)
Glucose, Bld: 152 mg/dL — ABNORMAL HIGH (ref 70–99)
Phosphorus: 5.2 mg/dL — ABNORMAL HIGH (ref 2.5–4.6)
Potassium: 3.9 mmol/L (ref 3.5–5.1)
Sodium: 136 mmol/L (ref 135–145)

## 2022-06-28 LAB — CBC
HCT: 26.6 % — ABNORMAL LOW (ref 36.0–46.0)
Hemoglobin: 8.4 g/dL — ABNORMAL LOW (ref 12.0–15.0)
MCH: 27.6 pg (ref 26.0–34.0)
MCHC: 31.6 g/dL (ref 30.0–36.0)
MCV: 87.5 fL (ref 80.0–100.0)
Platelets: 367 10*3/uL (ref 150–400)
RBC: 3.04 MIL/uL — ABNORMAL LOW (ref 3.87–5.11)
RDW: 15.9 % — ABNORMAL HIGH (ref 11.5–15.5)
WBC: 6.6 10*3/uL (ref 4.0–10.5)
nRBC: 0 % (ref 0.0–0.2)

## 2022-06-28 LAB — HEPATITIS B SURFACE ANTIBODY, QUANTITATIVE: Hep B S AB Quant (Post): 49.4 m[IU]/mL (ref >9.9)

## 2022-06-28 LAB — GLUCOSE, CAPILLARY
Glucose-Capillary: 139 mg/dL — ABNORMAL HIGH (ref 70–99)
Glucose-Capillary: 127 mg/dL — ABNORMAL HIGH (ref 70–99)
Glucose-Capillary: 134 mg/dL — ABNORMAL HIGH (ref 70–99)

## 2022-06-28 SURGERY — ARTERIOVENOUS (AV) FISTULA CREATION
Anesthesia: Monitor Anesthesia Care | Laterality: Left

## 2022-06-28 MED ORDER — HYDRALAZINE HCL 50 MG PO TABS
50.0000 mg | ORAL_TABLET | Freq: Once | ORAL | Status: DC
  Filled 2022-06-28 (×2): qty 1

## 2022-06-28 MED ORDER — GADOBUTROL 1 MMOL/ML IV SOLN
7.0000 mL | Freq: Once | INTRAVENOUS | Status: AC | PRN
  Administered 2022-06-28: 7 mL via INTRAVENOUS

## 2022-06-28 MED ORDER — HYDRALAZINE HCL 50 MG PO TABS: 75.0000 mg | ORAL_TABLET | Freq: Two times a day (BID) | ORAL | Status: DC

## 2022-06-28 MED ORDER — CHLORHEXIDINE GLUCONATE CLOTH 2 % EX PADS
6.0000 | MEDICATED_PAD | Freq: Every day | CUTANEOUS | Status: DC
  Administered 2022-06-29 – 2022-07-01 (×2): 6 via TOPICAL

## 2022-06-28 MED ORDER — HALOPERIDOL LACTATE 5 MG/ML IJ SOLN
2.0000 mg | Freq: Once | INTRAMUSCULAR | Status: AC
  Administered 2022-06-28: 2 mg via INTRAMUSCULAR
  Filled 2022-06-28: qty 1

## 2022-06-28 MED ORDER — HEPARIN SODIUM (PORCINE) 1000 UNIT/ML IJ SOLN
INTRAMUSCULAR | Status: AC
  Administered 2022-06-28: 3200 [IU]
  Filled 2022-06-28: qty 4

## 2022-06-28 NOTE — Progress Notes (Signed)
Mobility Specialist Progress Note    06/28/22 0939  Mobility  Activity Ambulated with assistance in hallway  Level of Assistance Contact guard assist, steadying assist  Assistive Device Four wheel walker  Distance Ambulated (ft) 420 ft  Activity Response Tolerated well  Mobility Referral Yes  $Mobility charge 1 Mobility   Pre-Mobility: 72 HR, 94% SpO2 During Mobility: 85 HR, 93% SpO2 Post-Mobility: 74 HR, 96% SpO2  Pt received in bed and agreeable. No complaints on walk. Returned to bed with call bell in reach.    Hildred Alamin Mobility Specialist  Please Psychologist, sport and exercise or Rehab Office at [phone]

## 2022-06-28 NOTE — Progress Notes (Signed)
Nephrology Follow-Up note   Assessment/Recommendations: Amber Cross is a/an 61 y.o. female with a past medical history significant for HFrEF, HTN, DM2, small cell lung cancer, chronic hypoxic respiratory failure, and CKD IV who presents with acute on chronic hypoxic respiratory failure   # AKI on CKD 4 with progression to ESRD: Secondary to hemodynamic mediated process possibly some degree of cardiorenal syndrome. Significant CKD related to diabetes (biopsy proven) with severe interstitial fibrosis and tubular atrophy.  Less likely to recover.  She has had a pericardial effusion and pleural effusion.  RIJ tunneled dialysis catheter placed on 11/29 with IR.  Started HD on 11/30 for overload including pleural and cardiac effusions  ------------------------ - HD today and on 12/2 as well  - Consulted [facility]VS for AVF  - Will consult HD SW to initiate CLIP process - note that she is ESRD at this point    # Acute on chronic hypoxic respiratory failure: Continue with volume optimization as desired based on cardiac status.  Supplemental oxygen as needed.  Overall improved but overloaded  # Right pleural effusion  - s/p chest tube with IR on 11/29 - optimize volume with HD   # Pericardial effusion: Status post pericardial window on 06/18/2022.  Cytology negative for malignancy. Cardiothoracic surgery following. Started HD and optimizing volume with HD   # CKD stage IV  - note her baseline Cr is 3-3.3 per charting and with severe interstitial fibrosis and tubular atrophy on past biopsy  # Hypertension: Acceptable on current regimen    # HFrEF:  has had minimal urine output and minimal response to lasix IV.  Optimize volume with HD as above    # Metabolic acidosis: likely 2/2 CKD.  To be managed with HD   # Anemia - multifactorial. Iron sat 29.  - Avoiding ESA given malignancy. Transfusion as needed  # Small cell lung cancer: Radiation finished in October.  Per charting she is planning to see  oncology in December for restaging.  Would consider reaching out to oncology    # Hyperphosphatemia - increased renvela.  Starting HD  Disposition - continue inpatient monitoring.  We are searching for an outpatient HD unit  ___________________________________________________________  CC: shortness of breath   Interval History/Subjective:  She had 500 mL UOP over 11/30.  She had her first HD on 11/30 with 400 mL UF.  She just ate about 5 corn chips around 7:15 am.  Spoke with RN and moved her snacks away from her bed.  I called surgery and they will do her last case for now.      Review of systems:    She denies any shortness of breath at rest or exertion  Denies chest pain  Denies n/v  Medications:  Current Facility-Administered Medications  Medication Dose Route Frequency Provider Last Rate Last Admin   atorvastatin (LIPITOR) tablet 80 mg  80 mg Oral Daily Barrett, Erin R, PA-C   80 mg at 06/27/22 0950   bisacodyl (DULCOLAX) EC tablet 10 mg  10 mg Oral Daily Stehler, Mel Almond C, PA-C   10 mg at 06/27/22 0953   carvedilol (COREG) tablet 12.5 mg  12.5 mg Oral BID WC Reesa Chew, MD   12.5 mg at 06/28/22 7829   Chlorhexidine Gluconate Cloth 2 % PADS 6 each  6 each Topical Daily Dahlia Byes, MD   6 each at 06/26/22 0922   Chlorhexidine Gluconate Cloth 2 % PADS 6 each  6 each Topical Q0600 Claudia Desanctis, MD  6 each at 06/27/22 0600   Chlorhexidine Gluconate Cloth 2 % PADS 6 each  6 each Topical Q0600 Claudia Desanctis, MD       gabapentin (NEURONTIN) capsule 100 mg  100 mg Oral QHS Stehler, Pricilla Larsson, PA-C   100 mg at 06/27/22 2207   heparin injection 5,000 Units  5,000 Units Subcutaneous Q12H Monia Sabal, PA-C   5,000 Units at 06/27/22 2207   hydrALAZINE (APRESOLINE) tablet 50 mg  50 mg Oral Q12H Geralynn Rile, MD   50 mg at 06/28/22 0649   insulin aspart (novoLOG) injection 0-9 Units  0-9 Units Subcutaneous TID WC & HS Dahlia Byes, MD   1 Units at 06/28/22 1093    isosorbide mononitrate (IMDUR) 24 hr tablet 30 mg  30 mg Oral Daily Geralynn Rile, MD   30 mg at 06/27/22 0949   lidocaine (XYLOCAINE) 1 % (with pres) injection 10 mL  10 mL Intradermal Once Markus Daft, MD       metoCLOPramide (REGLAN) injection 10 mg  10 mg Intravenous Q12H Dahlia Byes, MD   10 mg at 06/27/22 2207   nitroGLYCERIN (NITROSTAT) SL tablet 0.4 mg  0.4 mg Sublingual Q5 min PRN Magdalene River, PA-C       ondansetron [facility]) injection 4 mg  4 mg Intravenous Q6H PRN Dahlia Byes, MD   4 mg at 06/26/22 1321   Oral care mouth rinse  15 mL Mouth Rinse PRN Dahlia Byes, MD       oxyCODONE (Oxy IR/ROXICODONE) immediate release tablet 5 mg  5 mg Oral Q4H PRN Dahlia Byes, MD   5 mg at 06/27/22 2211   pantoprazole (PROTONIX) EC tablet 40 mg  40 mg Oral Daily Magdalene River, PA-C   40 mg at 06/27/22 0950   promethazine (PHENERGAN) 6.25 mg in sodium chloride 0.9 % 50 mL IVPB  6.25 mg Intravenous Q6H PRN Dahlia Byes, MD       senna-docusate (Senokot-S) tablet 1 tablet  1 tablet Oral QHS Wynelle Beckmann C, PA-C   1 tablet at 06/27/22 2207   sevelamer carbonate (RENVELA) tablet 1,600 mg  1,600 mg Oral TID WC Claudia Desanctis, MD   1,600 mg at 06/27/22 1230   traZODone (DESYREL) tablet 50 mg  50 mg Oral QHS PRN Wynelle Beckmann C, PA-C   50 mg at 06/27/22 2211      Physical Exam: Vitals:   06/27/22 2313 06/28/22 0446  BP: (!) 149/60 (!) 142/69  Pulse: 79 74  Resp: 16 16  Temp: 98 F (36.7 C) 98.3 F (36.8 C)  SpO2: 95% 94%   No intake/output data recorded.  Intake/Output Summary (Last 24 hours) at 06/28/2022 0716 Last data filed at 06/28/2022 0447 Gross per 24 hour  Intake --  Output 1250 ml  Net -1250 ml    General adult female in bed in no acute distress  HEENT normocephalic atraumatic extraocular movements intact sclera anicteric Neck supple trachea midline Lungs crackles at bases and right pleural rub; normal work of breathing at rest; room air Heart  S1S2  Abdomen soft nontender nondistended Extremities 2-3+ edema bilateral lower extremities Psych normal mood and affect Neuro oriented to person, location, and time and is conversant.   Test Results I personally reviewed new and old clinical labs and radiology tests Lab Results  Component Value Date   NA 136 06/28/2022   K 3.9 06/28/2022   CL 106 06/28/2022   CO2 21 (L) 06/28/2022   BUN  55 (H) 06/28/2022   CREATININE 5.16 (H) 06/28/2022   CALCIUM 7.7 (L) 06/28/2022   ALBUMIN 2.2 (L) 06/28/2022   PHOS 5.2 (H) 06/28/2022    CBC Recent Labs  Lab 06/26/22 0024 06/27/22 0026 06/28/22 0025  WBC 7.1 7.8 6.6  HGB 8.2* 8.8* 8.4*  HCT 26.3* 27.4* 26.6*  MCV 88.9 87.8 87.5  PLT 313 370 367    Claudia Desanctis, MD 7:38 AM 06/28/2022

## 2022-06-28 NOTE — Progress Notes (Signed)
[facility] [place] PROGRESS NOTE  ASSESSMENT / PLAN: Amber Cross is a 61 y.o. female with AKI on CKD IV. Planned to do AVF today, but she ate. We have an urgent add-on already for this afternoon, and will not be able to accommodate her today. We will reschedule for Monday. OK for diet. OK for HD today. Will check in again Sunday.   SUBJECTIVE: No complaints. Explained rationale for cancellation today.  OBJECTIVE: BP (!) 150/62 (BP Location: Right Arm)   Pulse 71   Temp 97.9 F (36.6 C) (Oral)   Resp 12   Ht 5\' 7"  (1.702 m)   Wt 68.6 kg   LMP 09/29/2011   SpO2 91%   Breastfeeding Yes   BMI 23.69 kg/m   Intake/Output Summary (Last 24 hours) at 06/28/2022 0934 Last data filed at 06/28/2022 0447 Gross per 24 hour  Intake --  Output 1350 ml  Net -1350 ml    No acute distress RRR Unlabored PIVs removed from left arm TDC in place     Latest Ref Rng & Units 06/28/2022   12:25 AM 06/27/2022   12:26 AM 06/26/2022   12:24 AM  CBC  WBC 4.0 - 10.5 K/uL 6.6  7.8  7.1   Hemoglobin 12.0 - 15.0 g/dL 8.4  8.8  8.2   Hematocrit 36.0 - 46.0 % 26.6  27.4  26.3   Platelets 150 - 400 K/uL 367  370  313         Latest Ref Rng & Units 06/28/2022   12:25 AM 06/27/2022   12:26 AM 06/26/2022   12:24 AM  CMP  Glucose 70 - 99 mg/dL 152  134  125   BUN 6 - 20 mg/dL 55  75  73   Creatinine 0.44 - 1.00 mg/dL 5.16  6.79  6.59   Sodium 135 - 145 mmol/L 136  137  139   Potassium 3.5 - 5.1 mmol/L 3.9  4.6  4.5   Chloride 98 - 111 mmol/L 106  107  111   CO2 22 - 32 mmol/L 21  19  19    Calcium 8.9 - 10.3 mg/dL 7.7  8.2  7.8     Estimated Creatinine Clearance: 11.3 mL/min (A) (by C-G formula based on SCr of 5.16 mg/dL (H)).  Amber Cross. Stanford Breed, MD [facility] Phone Number: [phone] 06/28/2022 9:34 AM

## 2022-06-28 NOTE — Progress Notes (Signed)
   06/28/22 2018  Assess: MEWS Score  BP (!) 212/173  MAP (mmHg) 186  Pulse Rate 89  ECG Heart Rate 89  Resp (!) 23  SpO2 96 %  Assess: MEWS Score  MEWS Temp 0  MEWS Systolic 2  MEWS Pulse 0  MEWS RR 1  MEWS LOC 0  MEWS Score 3  MEWS Score Color Yellow  Assess: if the MEWS score is Yellow or Red  Were vital signs taken at a resting state? Yes  Focused Assessment No change from prior assessment  Does the patient meet 2 or more of the SIRS criteria? No  MEWS guidelines implemented *See Row Information* Yes  Treat  MEWS Interventions Administered scheduled meds/treatments;Administered prn meds/treatments  Pain Scale 0-10  Pain Score 0  Complains of Nausea /  Vomiting  Nausea relieved by Antiemetic  Take Vital Signs  Increase Vital Sign Frequency  Yellow: Q 2hr X 2 then Q 4hr X 2, if remains yellow, continue Q 4hrs  Escalate  MEWS: Escalate Yellow: discuss with charge nurse/RN and consider discussing with provider and RRT  Notify: Charge Nurse/RN  Name of Charge Nurse/RN Notified Shanon Ace, RN  Date Charge Nurse/RN Notified 06/28/22  Time Charge Nurse/RN Notified 2050  Provider Notification  Provider Name/Title Dr Wilmon Pali  Date Provider Notified 06/28/22  Time Provider Notified 2010  Method of Notification Page  Notification Reason Change in status  Provider response See new orders  Date of Provider Response 06/28/22  Time of Provider Response 2015  Document  Progress note created (see row info) Yes  Assess: SIRS CRITERIA  SIRS Temperature  0  SIRS Pulse 0  SIRS Respirations  1  SIRS WBC 1  SIRS Score Sum  2

## 2022-06-28 NOTE — Plan of Care (Signed)
  Problem: Education: Goal: Knowledge of General Education information will improve Description: Including pain rating scale, medication(s)/side effects and non-pharmacologic comfort measures Outcome: Progressing   Problem: Health Behavior/Discharge Planning: Goal: Ability to manage health-related needs will improve Outcome: Progressing   Problem: Clinical Measurements: Goal: Ability to maintain clinical measurements within normal limits will improve Outcome: Progressing Goal: Diagnostic test results will improve Outcome: Progressing   Problem: Activity: Goal: Risk for activity intolerance will decrease Outcome: Progressing   Problem: Coping: Goal: Level of anxiety will decrease Outcome: Progressing

## 2022-06-28 NOTE — Progress Notes (Signed)
HD#12 Subjective:  Patient summary: Amber Cross is a 61 year old female living with HFrEF, type 2 diabetes, small cell lung cancer, CKD 4 who presented with acute on chronic hypoxic respiratory failure secondary to moderate-large pericardial effusion with cardiac tamponade.  Patient underwent subxiphoid pericardiocentesis on 11/21 which collected about 5 cc of clear fluid.  Cytology of the pleural and pericardial fluid were negative for malignancy.  Patient was observed in the ICU after procedure, and slowly was downgraded to progressive.  Current concern, include worsening renal disease requiring dialysis.  Overnight events: No overnight events reports   Patient did get HD yesterday and tolerated well with removal of 424mL. Patient is resting in bed comfortably upon my exam.  Patient has no concerns.  Patient is in a good mood.  Patient did eat some Fritos in the morning, which pushed back to her AV fistula procedure. Per charting, vascular surgery are pushing back her AV fistula procedure to Monday 07/01/2022.   Objective:  Vital signs in last 24 hours: Vitals:   06/27/22 1948 06/27/22 2207 06/27/22 2313 06/28/22 0446  BP: (!) 166/72 (!) 169/72 (!) 149/60 (!) 142/69  Pulse: 68  79 74  Resp: 15  16 16   Temp: 97.7 F (36.5 C)  98 F (36.7 C) 98.3 F (36.8 C)  TempSrc: Oral  Oral Oral  SpO2: 95%  95% 94%  Weight:      Height:       Supplemental O2: Room Air 92%    Physical Exam:  Physical Exam  General: Patient resting comfortably in recliner in no acute distress Head: Normocephalic, atraumatic  Cardio: Regular rate and rhythm, no murmurs, rubs or gallops  Pulmonary: Clear to ausculation bilaterally with no rales, rhonchi, and crackles.  Patient with chest tube in place, draining well with orange to yellow fluid noted  Abdomen: Soft, nontender with normoactive bowel sounds MSK: 5/5 strength to upper and lower extremities.   Filed Weights   06/27/22 0400 06/27/22 1620  06/27/22 1913  Weight: 68.5 kg 68 kg 68.6 kg     Intake/Output Summary (Last 24 hours) at 06/28/2022 0554 Last data filed at 06/28/2022 0447 Gross per 24 hour  Intake --  Output 1525 ml  Net -1525 ml    Net IO Since Admission: -633.2 mL [06/28/22 0554]  Pertinent Labs:    Latest Ref Rng & Units 06/28/2022   12:25 AM 06/27/2022   12:26 AM 06/26/2022   12:24 AM  CBC  WBC 4.0 - 10.5 K/uL 6.6  7.8  7.1   Hemoglobin 12.0 - 15.0 g/dL 8.4  8.8  8.2   Hematocrit 36.0 - 46.0 % 26.6  27.4  26.3   Platelets 150 - 400 K/uL 367  370  313        Latest Ref Rng & Units 06/28/2022   12:25 AM 06/27/2022   12:26 AM 06/26/2022   12:24 AM  CMP  Glucose 70 - 99 mg/dL 152  134  125   BUN 6 - 20 mg/dL 55  75  73   Creatinine 0.44 - 1.00 mg/dL 5.16  6.79  6.59   Sodium 135 - 145 mmol/L 136  137  139   Potassium 3.5 - 5.1 mmol/L 3.9  4.6  4.5   Chloride 98 - 111 mmol/L 106  107  111   CO2 22 - 32 mmol/L 21  19  19    Calcium 8.9 - 10.3 mg/dL 7.7  8.2  7.8     Imaging:  VAS [place] UPPER EXT VEIN MAPPING (PRE-OP AVF)  Result Date: 06/27/2022 UPPER EXTREMITY VEIN MAPPING Patient Name:  Amber Cross  Date of Exam:   06/27/2022 Medical Rec #: [id number]        Accession #:    [id number] Date of Birth: 1961/04/22        Patient Gender: F Patient Age:   7 years Exam Location:  [facility] Procedure:      VAS [place] UPPER EXT VEIN MAPPING (PRE-OP AVF) Referring Phys: Jamelle Haring --------------------------------------------------------------------------------  Indications: Pre-access. Performing Technologist: Archie Patten RVS  Examination Guidelines: A complete evaluation includes B-mode imaging, spectral Doppler, color Doppler, and power Doppler as needed of all accessible portions of each vessel. Bilateral testing is considered an integral part of a complete examination. Limited examinations for reoccurring indications may be performed as noted.  +-----------------+-------------+----------+----------------------+ Right Cephalic   Diameter (cm)Depth (cm)       Findings        +-----------------+-------------+----------+----------------------+ Shoulder             0.38        0.34                          +-----------------+-------------+----------+----------------------+ Prox upper arm       0.41        0.23                          +-----------------+-------------+----------+----------------------+ Mid upper arm        0.39        0.29                          +-----------------+-------------+----------+----------------------+ Dist upper arm       0.34        0.29                          +-----------------+-------------+----------+----------------------+ Antecubital fossa    0.28        0.26   branching and thrombus +-----------------+-------------+----------+----------------------+ Prox forearm         0.19        0.24          thrombus        +-----------------+-------------+----------+----------------------+ Mid forearm          0.13        0.21          thrombus        +-----------------+-------------+----------+----------------------+ Dist forearm         0.18        0.22                          +-----------------+-------------+----------+----------------------+ Wrist                0.13        0.22                          +-----------------+-------------+----------+----------------------+ +-----------------+-------------+----------+--------------+ Right Basilic    Diameter (cm)Depth (cm)   Findings    +-----------------+-------------+----------+--------------+ Shoulder                                not visualized +-----------------+-------------+----------+--------------+ Prox upper arm  not visualized +-----------------+-------------+----------+--------------+ Mid upper arm                           not visualized  +-----------------+-------------+----------+--------------+ Dist upper arm       0.29        0.97                  +-----------------+-------------+----------+--------------+ Antecubital fossa    0.36        0.62     branching    +-----------------+-------------+----------+--------------+ Prox forearm                            not visualized +-----------------+-------------+----------+--------------+ Mid forearm                             not visualized +-----------------+-------------+----------+--------------+ Distal forearm                          not visualized +-----------------+-------------+----------+--------------+ +-----------------+-------------+----------+----------------+ Left Cephalic    Diameter (cm)Depth (cm)    Findings     +-----------------+-------------+----------+----------------+ Shoulder             0.30        0.43                    +-----------------+-------------+----------+----------------+ Prox upper arm       0.26        0.32                    +-----------------+-------------+----------+----------------+ Mid upper arm        0.35        0.32                    +-----------------+-------------+----------+----------------+ Dist upper arm       0.23        0.31                    +-----------------+-------------+----------+----------------+ Antecubital fossa    0.58        0.26   partial thrombus +-----------------+-------------+----------+----------------+ Prox forearm         0.22        0.41      branching     +-----------------+-------------+----------+----------------+ Mid forearm                              not visualized  +-----------------+-------------+----------+----------------+ Dist forearm                             not visualized  +-----------------+-------------+----------+----------------+ +-----------------+-------------+----------+---------+ Left Basilic     Diameter (cm)Depth (cm)Findings   +-----------------+-------------+----------+---------+ Prox upper arm       0.35        0.79             +-----------------+-------------+----------+---------+ Mid upper arm        0.35        0.80             +-----------------+-------------+----------+---------+ Dist upper arm       0.48        0.82   branching +-----------------+-------------+----------+---------+ Antecubital fossa    0.29        0.38             +-----------------+-------------+----------+---------+  Prox forearm         0.17        0.20             +-----------------+-------------+----------+---------+ Mid forearm          0.15        0.37             +-----------------+-------------+----------+---------+ Distal forearm       0.24        0.35             +-----------------+-------------+----------+---------+ *See table(s) above for measurements and observations.  Diagnosing physician: Jamelle Haring Electronically signed by Jamelle Haring on 06/27/2022 at 5:36:19 PM.    Final    DG Chest Port 1 View  Result Date: 06/27/2022 CLINICAL DATA:  Chest tube placement EXAM: PORTABLE CHEST 1 VIEW COMPARISON:  CXR 06/26/22 FINDINGS: Interval placement of a right-sided tunneled central venous catheter which terminates at the cavoatrial junction. There is also interval placement of a right-sided pleural pigtail drainage catheter. Compared to prior exam there is interval decrease in the size of the right-sided pleural effusion. There may be a very small right apical pneumothorax. Cardiac and mediastinal contours are unchanged from prior exam. Poor visualization of the left hemidiaphragm may be secondary to a combination of a pleural effusion atelectasis. Visualized upper abdomen is unremarkable. No displaced rib fractures. IMPRESSION: 1. Interval placement of a right-sided pleural pigtail drainage catheter with interval decrease in the size of the right-sided pleural effusion. There may be a very small right apical  pneumothorax. 2. Poor visualization of the left hemidiaphragm may be secondary to a combination of a pleural effusion atelectasis. Electronically Signed   By: Marin Roberts M.D.   On: 06/27/2022 08:07    Assessment/Plan:   Principal Problem:   Tamponade Active Problems:   Type 2 diabetes mellitus with diabetic polyneuropathy (HCC)   Acute on chronic systolic congestive heart failure (HCC)   Small cell lung cancer, right upper lobe ([place])   AKI (acute kidney injury) ([place])   Hypertensive emergency   Patient Summary: Amber Cross is a 61 y.o. female living with HFrEF, type 2 diabetes, small cell lung cancer, CKD 4 who presented with acute on chronic hypoxic respiratory failure secondary to moderate-large pericardial effusion with cardiac tamponade.  Patient underwent subxiphoid pericardiocentesis on 11/21 which collected about 5 cc of clear fluid.  Cytology of the pleural and pericardial fluid were negative for malignancy.  Patient was observed in the ICU after procedure, and slowly was downgraded to progressive.  Current concern, include worsening renal disease requiring dialysis.  #Acute hypoxic respiratory failure, resolved  #Cardiac tamponade s/p cardio window Patient's respiratory status remained stable.  Patient has chest tube in and has drained about 350 cc over the past 24 hours.  Patient has no concerns.  Patient is stable.  Lung sounds clear. continue to monitor respiratory status.  Chest x-ray showing small right apical pneumothorax with improving effusion. -Appreciate CT surgery, cardiology, and nephrology recommendation -Can give supplemental oxygen to keep SpO2 greater than 92%  #Right pleural effusion resolving  X-ray today shows resolving right pleural effusion with small right apical pneumothorax.  Patient had HD yesterday with removal of 400 mL, which likely helped.  Patient does have chest tube in, draining well.  No concern for respiratory status at this point.   -Continue  to monitor respiratory status  #HFrEF #Hypertension Patient had 400 mL of output patient has had minimal output with Lasix.  Patient  blood pressure is elevated into the 160s and 170s.  Likely this should be resolved with dialysis.  Will continue to monitor.  GDMT is not able to be optimized given history of CKD 5 and approaching ESRD.  Will continue with current medications of Coreg 12.5 mg daily, Imdur 30 mg daily, and hydralazine 50 mg 3 times daily. -HD today  -Continue Coreg 12.5, Imdur and hydralazine -Continue to monitor blood pressure  # CKD 5, approaching ESRD #Nephrotic range proteinuria #Non anion gapped metabolic acidosis likely in the setting of CKD 5  Patient has a run of dialysis yesterday, and tolerated very well.  Patient had 400 mL pulled off.  Patient states that it went well, and she had no concerns.  Renal function panel looked improved today.  Plan is to continue dialysis today.  Patient will need a AV fistula, which was supposed to happen today, but due to an urgent case with vascular surgery, it has been pushed to Monday 07/01/2022.  -HD today -AV fistula 07/01/2022 -Continue to follow renal function panel  #Small right apical pneumothorax On chest x-ray today, there is stable small right apical pneumothorax.  No clinical symptoms or decline.  Continue to monitor. -Continue to monitor respiratory status.    #Stage IA small cell lung cancer in Right upper lobe #Tobacco use disorder Diagnosed in May 2023.  Patient has had radiation treatment in the past. Patient did have CT chest in the hospital in which the nodule was not visible given other overlying airspace opacities.  Plan for repeat CT chest and MRI brain in hospital. -Follow-up oncology outpatient -Repeat MRI brain and CT chest during hospitalization.  #Anemia of Chronic disease  In the setting of chronic kidney disease. Hgb stable at 8.4. -Continue to monitor -Transfuse if less than 7  #Urinary  retention Patient has a foley cath placed since her hospitalization at [facility].  Will keep it in for now to monitor urine output.  Can consider voiding trial after dialysis -Follow-up with urology outpatient  #Type 2 diabetes A1c of 9.7 in July.  A1c 6.7 today. CBG ranges between 139-152. -Continue sliding scale insulin for now.  She only required 5 units yesterday. -Continue gabapentin  Diet: Carb/Renal IVF: None,None VTE: Heparin Code: Full PT/OT recs: Pending   Dispo: Anticipated discharge to Home in 3 days pending initiating dialysis.   Leigh Aurora, DO 06/28/2022, 5:54 AM Pager: [phone]  Please contact the on call pager after 5 pm and on weekends at [phone].

## 2022-06-28 NOTE — Progress Notes (Addendum)
      [address]       [place] [address]             [phone]     Chest tube 350 cc/24h- cont to leave in place for now  CXR- stable, appears well drained  Now on dialysis      Exam: lungs: clear,              Cardiac : soft systolic murmur, RRR   Ext: + BLE pitting edema  CT site ok- , no air leak     Wayne E Gold, PA-C   Leave R chest tube until output < 100/ day for a few days after fluid removal with HD.Chemical pleurodesis with talc prior to removal- will follow.  Chest incisions clean, dry  Blood pressure (!) 150/62, pulse 71, temperature 97.9 F (36.6 C), temperature source Oral, resp. rate 12, height 5\' 7"  (1.702 m), weight 68.6 kg, last menstrual period 09/29/2011, SpO2 91 %,

## 2022-06-28 NOTE — Progress Notes (Signed)
OT Cancellation Note  Patient Details Name: Dellanira Dillow MRN: [id number] DOB: [birth year]   Cancelled Treatment:    Reason Eval/Treat Not Completed: Patient declined, no reason specified (Patient stated she did not want to participate with OT at this time and would rather just walk with mobility.) Lodema Hong, [place]  Office [place] 06/28/2022, 8:20 AM

## 2022-06-28 NOTE — Progress Notes (Signed)
Requested to see pt for out-pt HD needs at d/c. Met with pt at bedside and pt's son on the phone during conversation as well. Introduced self and explained role. Discussed different out-pt HD options in the [facility] area where pt lives. Pt prefers [place] if possible due to location to pt's home but also pt would like to keep a [facility] provider (pt sees Dr Joelyn Oms as out-pt). Referral submitted to [facility] admissions today for review. Pt states she drives but currently does not have a car. Pt states she will need transportation options/resources to/from HD at d/c. Pt states that she may have someone that can assist with transportation with the first few treatments but will need assistance long-term. Contacted RN CM and CSW via secure chat to make them aware of pt's transportation needs at d/c and to request that staff provide options/resources as able. Will assist as needed.   [name] Renal Navigator [phone]

## 2022-06-29 DIAGNOSIS — I314 Cardiac tamponade: Secondary | ICD-10-CM | POA: Diagnosis not present

## 2022-06-29 LAB — CBC
HCT: 27.9 % — ABNORMAL LOW (ref 36.0–46.0)
Hemoglobin: 8.5 g/dL — ABNORMAL LOW (ref 12.0–15.0)
MCH: 27.4 pg (ref 26.0–34.0)
MCHC: 30.5 g/dL (ref 30.0–36.0)
MCV: 90 fL (ref 80.0–100.0)
Platelets: 423 10*3/uL — ABNORMAL HIGH (ref 150–400)
RBC: 3.1 MIL/uL — ABNORMAL LOW (ref 3.87–5.11)
RDW: 15.8 % — ABNORMAL HIGH (ref 11.5–15.5)
WBC: 8.6 10*3/uL (ref 4.0–10.5)
nRBC: 0 % (ref 0.0–0.2)

## 2022-06-29 LAB — GLUCOSE, CAPILLARY
Glucose-Capillary: 88 mg/dL (ref 70–99)
Glucose-Capillary: 87 mg/dL (ref 70–99)
Glucose-Capillary: 83 mg/dL (ref 70–99)
Glucose-Capillary: 146 mg/dL — ABNORMAL HIGH (ref 70–99)

## 2022-06-29 LAB — RENAL FUNCTION PANEL
Albumin: 2.3 g/dL — ABNORMAL LOW (ref 3.5–5.0)
Anion gap: 9 (ref 5–15)
BUN: 25 mg/dL — ABNORMAL HIGH (ref 6–20)
CO2: 26 mmol/L (ref 22–32)
Calcium: 7.7 mg/dL — ABNORMAL LOW (ref 8.9–10.3)
Chloride: 102 mmol/L (ref 98–111)
Creatinine, Ser: 3.06 mg/dL — ABNORMAL HIGH (ref 0.44–1.00)
GFR, Estimated: 17 mL/min — ABNORMAL LOW (ref >60)
Glucose, Bld: 106 mg/dL — ABNORMAL HIGH (ref 70–99)
Phosphorus: 3.2 mg/dL (ref 2.5–4.6)
Potassium: 3.4 mmol/L — ABNORMAL LOW (ref 3.5–5.1)
Sodium: 137 mmol/L (ref 135–145)

## 2022-06-29 MED ORDER — HEPARIN SODIUM (PORCINE) 1000 UNIT/ML IJ SOLN
INTRAMUSCULAR | Status: AC
  Filled 2022-06-29: qty 4

## 2022-06-29 MED ORDER — LABETALOL HCL 5 MG/ML IV SOLN
10.0000 mg | INTRAVENOUS | Status: DC | PRN
  Administered 2022-06-30: 10 mg via INTRAVENOUS
  Filled 2022-06-29: qty 4

## 2022-06-29 MED ORDER — FUROSEMIDE 10 MG/ML IJ SOLN
80.0000 mg | Freq: Once | INTRAMUSCULAR | Status: AC
  Administered 2022-06-29: 80 mg via INTRAVENOUS
  Filled 2022-06-29: qty 8

## 2022-06-29 MED ORDER — AMLODIPINE BESYLATE 10 MG PO TABS
10.0000 mg | ORAL_TABLET | Freq: Every evening | ORAL | Status: DC
  Administered 2022-06-29 – 2022-07-02 (×4): 10 mg via ORAL
  Filled 2022-06-29 (×5): qty 1

## 2022-06-29 MED ORDER — AMLODIPINE BESYLATE 5 MG PO TABS: 5.0000 mg | ORAL_TABLET | Freq: Every evening | ORAL | Status: DC

## 2022-06-29 MED ORDER — HYDRALAZINE HCL 50 MG PO TABS
50.0000 mg | ORAL_TABLET | Freq: Two times a day (BID) | ORAL | Status: DC
  Administered 2022-06-29 – 2022-06-30 (×2): 50 mg via ORAL
  Filled 2022-06-29 (×3): qty 1

## 2022-06-29 MED ORDER — HYDRALAZINE HCL 50 MG PO TABS
50.0000 mg | ORAL_TABLET | Freq: Once | ORAL | Status: AC
  Administered 2022-06-29: 50 mg via ORAL
  Filled 2022-06-29 (×2): qty 1

## 2022-06-29 NOTE — Plan of Care (Signed)

## 2022-06-29 NOTE — Progress Notes (Signed)
Mobility Specialist Progress Note:   06/29/22 1545  Mobility  Activity Refused mobility   Pt refused mobility d/t fatigue from HD and not eating since breakfast. Will f/u as able.    Andrey Campanile Mobility Specialist Please contact via SecureChat or  Rehab office at [phone]

## 2022-06-29 NOTE — Progress Notes (Addendum)
Nephrology Follow-Up note   Assessment/Recommendations: Amber Cross is a/an 61 y.o. female with a past medical history significant for HFrEF, HTN, DM2, small cell lung cancer, chronic hypoxic respiratory failure, and CKD IV who presents with acute on chronic hypoxic respiratory failure   # AKI on CKD 4 with progression to ESRD: Secondary to hemodynamic mediated process possibly some degree of cardiorenal syndrome. Significant CKD related to diabetes (biopsy proven) with severe interstitial fibrosis and tubular atrophy.  Baseline Cr is 3-3.3 per chartingLess likely to recover.  She has had a pericardial effusion and pleural effusion.  RIJ tunneled dialysis catheter placed on 11/29 with IR.  Started HD on 11/30 for overload including pleural and cardiac effusions  ------------------------ - HD today.  Plan for HD per MWF schedule for now   - Consulted [facility] for AVF - this is planned for 12/4.  Appreciate their assistance - AM labs not yet available  - Will consult HD SW to initiate CLIP process - note that she is ESRD  - Lasix as below  - Remove foley today   # Acute on chronic hypoxic respiratory failure: Continue with volume optimization as desired based on cardiac status.  Supplemental oxygen as needed.  Overall improved but overloaded  # Right pleural effusion  - s/p chest tube with IR on 11/29 - optimize volume with HD   # Pericardial effusion: Status post pericardial window on 06/18/2022.  Cytology negative for malignancy. Cardiothoracic surgery following. Started HD and optimizing volume with HD   # Hypertension:  - Increase UF with HD.   - lasix 80 mg IV once - Start amlodipine 5 mg nightly  - Note hydralazine was increased per team   # HFrEF:  has had minimal urine output and minimal response to lasix IV.  Optimize volume with HD as above    # Metabolic acidosis: likely 2/2 CKD.  To be managed with HD   # Anemia - multifactorial. Iron sat 29.  - Avoiding ESA given  malignancy. Transfusion as needed  # Small cell lung cancer: Radiation finished in October.  Per charting she is planning to see oncology in December for restaging.  # Metabolic bone disease - increased renvela.  continue HD. Check intact PTH  Disposition - continue inpatient monitoring.  We are searching for an outpatient HD unit  ___________________________________________________________  CC: shortness of breath   Interval History/Subjective:  She had 650 mL UOP over 12/1.  She had her second HD treatment on 12/1 with 1.5 kg UF.  She called to me across the room and gave me a high five after dialysis yesterday and states that she felt great after dialysis but has felt poorly overnight mostly citing nausea.   Review of systems:     She denies any shortness of breath at rest or exertion  Denies chest pain  She has had nausea this am   Medications:  Current Facility-Administered Medications  Medication Dose Route Frequency Provider Last Rate Last Admin   atorvastatin (LIPITOR) tablet 80 mg  80 mg Oral Daily Barrett, Erin R, PA-C   80 mg at 06/28/22 1826   bisacodyl (DULCOLAX) EC tablet 10 mg  10 mg Oral Daily Stehler, Mel Almond C, PA-C   10 mg at 06/28/22 1826   carvedilol (COREG) tablet 12.5 mg  12.5 mg Oral BID WC Reesa Chew, MD   12.5 mg at 06/29/22 0654   Chlorhexidine Gluconate Cloth 2 % PADS 6 each  6 each Topical Q0600 Claudia Desanctis,  MD       gabapentin (NEURONTIN) capsule 100 mg  100 mg Oral QHS Stehler, Pricilla Larsson, PA-C   100 mg at 06/27/22 2207   heparin injection 5,000 Units  5,000 Units Subcutaneous Q12H Monia Sabal, PA-C   5,000 Units at 06/28/22 2318   hydrALAZINE (APRESOLINE) tablet 50 mg  50 mg Oral Once Idamae Schuller, MD       hydrALAZINE (APRESOLINE) tablet 75 mg  75 mg Oral Q12H Idamae Schuller, MD       insulin aspart (novoLOG) injection 0-9 Units  0-9 Units Subcutaneous TID WC & HS Dahlia Byes, MD   1 Units at 06/28/22 2320   isosorbide mononitrate (IMDUR)  24 hr tablet 30 mg  30 mg Oral Daily Geralynn Rile, MD   30 mg at 06/28/22 1827   lidocaine (XYLOCAINE) 1 % (with pres) injection 10 mL  10 mL Intradermal Once Markus Daft, MD       metoCLOPramide (REGLAN) injection 10 mg  10 mg Intravenous Q12H Dahlia Byes, MD   10 mg at 06/28/22 2318   nitroGLYCERIN (NITROSTAT) SL tablet 0.4 mg  0.4 mg Sublingual Q5 min PRN Magdalene River, PA-C       ondansetron [facility]) injection 4 mg  4 mg Intravenous Q6H PRN Dahlia Byes, MD   4 mg at 06/29/22 0448   Oral care mouth rinse  15 mL Mouth Rinse PRN Dahlia Byes, MD       oxyCODONE (Oxy IR/ROXICODONE) immediate release tablet 5 mg  5 mg Oral Q4H PRN Dahlia Byes, MD   5 mg at 06/28/22 1830   pantoprazole (PROTONIX) EC tablet 40 mg  40 mg Oral Daily Stehler, Pricilla Larsson, PA-C   40 mg at 06/28/22 1827   promethazine (PHENERGAN) 6.25 mg in sodium chloride 0.9 % 50 mL IVPB  6.25 mg Intravenous Q6H PRN Dahlia Byes, MD       senna-docusate (Senokot-S) tablet 1 tablet  1 tablet Oral QHS Wynelle Beckmann C, PA-C   1 tablet at 06/27/22 2207   sevelamer carbonate (RENVELA) tablet 1,600 mg  1,600 mg Oral TID WC Claudia Desanctis, MD   1,600 mg at 06/28/22 1825   traZODone (DESYREL) tablet 50 mg  50 mg Oral QHS PRN Wynelle Beckmann C, PA-C   50 mg at 06/27/22 2211      Physical Exam: Vitals:   06/29/22 0452 06/29/22 0718  BP: (!) 188/78 (!) 192/82  Pulse: 74 72  Resp: 18 15  Temp: 98.6 F (37 C) 97.9 F (36.6 C)  SpO2: 94% 94%   No intake/output data recorded.  Intake/Output Summary (Last 24 hours) at 06/29/2022 8182 Last data filed at 06/29/2022 0630 Gross per 24 hour  Intake --  Output 2254 ml  Net -2254 ml    General adult female in bed in no acute distress   HEENT normocephalic atraumatic extraocular movements intact sclera anicteric Neck supple trachea midline Lungs crackles at bases and right pleural rub; normal work of breathing at rest; room air Heart S1S2  Abdomen soft nontender  nondistended Extremities 2-3+ edema bilateral lower extremities Psych normal mood and affect Neuro oriented to person, location, and time and is conversant.   Test Results I personally reviewed new and old clinical labs and radiology tests Lab Results  Component Value Date   NA 136 06/28/2022   K 3.9 06/28/2022   CL 106 06/28/2022   CO2 21 (L) 06/28/2022   BUN 55 (H) 06/28/2022   CREATININE 5.16 (  H) 06/28/2022   CALCIUM 7.7 (L) 06/28/2022   ALBUMIN 2.2 (L) 06/28/2022   PHOS 5.2 (H) 06/28/2022    CBC Recent Labs  Lab 06/26/22 0024 06/27/22 0026 06/28/22 0025  WBC 7.1 7.8 6.6  HGB 8.2* 8.8* 8.4*  HCT 26.3* 27.4* 26.6*  MCV 88.9 87.8 87.5  PLT 313 370 367    Claudia Desanctis, MD 7:53 AM 06/29/2022

## 2022-06-29 NOTE — Progress Notes (Signed)
Pt experiencing nausea and vomiting. IV Zofran was given but pt states she was unable to take any of her night time PO medications. Pt was educated on the importance of taking her blood pressure medication and was made aware that her blood pressure was very elevated at 212/173. On call cardiologist made aware of her refusal of medications.  One time dose of haldol IM was ordered and given. After zofran and haldol was given, pt still refused to take any PO medications due to her not wanting to throw it back up. IMTS on call was notified about pt refusal of PO medications and her elevated blood pressure. No new orders were given at that time.

## 2022-06-29 NOTE — Progress Notes (Signed)
Paged MD about patients blood pressure being high despite blood pressure medications. Patient asymptomatic. Got orders for iv labetalol. Will give and continue to monitor patient.

## 2022-06-29 NOTE — Progress Notes (Signed)
HD#13 Subjective:  Patient summary: Amber Cross is a 61 year old female living with HFrEF, type 2 diabetes, small cell lung cancer, CKD 4 who presented with acute on chronic hypoxic respiratory failure secondary to moderate-large pericardial effusion with cardiac tamponade.  Patient underwent subxiphoid pericardiocentesis on 11/21 which collected about 5 cc of clear fluid.  Cytology of the pleural and pericardial fluid were negative for malignancy.  Patient was observed in the ICU after procedure, and slowly was downgraded to progressive.  Current concern, include worsening renal disease requiring dialysis.  Overnight events: No overnight events reports   Pt was seen at bedside during HD. States doing well. Has nausea since yesterday.    Objective:  Vital signs in last 24 hours: Vitals:   06/28/22 2018 06/28/22 2041 06/28/22 2300 06/29/22 0452  BP: (!) 212/173 (!) 179/82 (!) 174/73 (!) 188/78  Pulse: 89 77 70 74  Resp: (!) 23 15 14 18   Temp:   98.1 F (36.7 C) 98.6 F (37 C)  TempSrc:   Oral Oral  SpO2: 96% 96% 96% 94%  Weight:      Height:       Supplemental O2: Room Air 92%   Output:  HD 1.5 L Chest tube output 249 cc Urine 650 cc Physical Exam:   General: Patient resting comfortably in bed HENT: Normocephalic, atraumatic  Cardio: Regular rate and rhythm, no murmurs, rubs or gallops  Pulmonary: Clear to ausculation bilaterally with no rales, rhonchi, and crackles.  Patient with chest tube in place, draining well with orange to yellow fluid noted  Abdomen: Soft, nontender with normoactive bowel sounds MSK: 5/5 strength to upper and lower extremities.   Filed Weights   06/27/22 1913 06/28/22 1132 06/28/22 1432  Weight: 68.6 kg 66.1 kg 64.6 kg     Intake/Output Summary (Last 24 hours) at 06/29/2022 0622 Last data filed at 06/28/2022 2200 Gross per 24 hour  Intake --  Output 2054 ml  Net -2054 ml    Net IO Since Admission: -2,787.2 mL [06/29/22  0622]  Pertinent Labs:    Latest Ref Rng & Units 06/28/2022   12:25 AM 06/27/2022   12:26 AM 06/26/2022   12:24 AM  CBC  WBC 4.0 - 10.5 K/uL 6.6  7.8  7.1   Hemoglobin 12.0 - 15.0 g/dL 8.4  8.8  8.2   Hematocrit 36.0 - 46.0 % 26.6  27.4  26.3   Platelets 150 - 400 K/uL 367  370  313        Latest Ref Rng & Units 06/28/2022   12:25 AM 06/27/2022   12:26 AM 06/26/2022   12:24 AM  CMP  Glucose 70 - 99 mg/dL 152  134  125   BUN 6 - 20 mg/dL 55  75  73   Creatinine 0.44 - 1.00 mg/dL 5.16  6.79  6.59   Sodium 135 - 145 mmol/L 136  137  139   Potassium 3.5 - 5.1 mmol/L 3.9  4.6  4.5   Chloride 98 - 111 mmol/L 106  107  111   CO2 22 - 32 mmol/L 21  19  19    Calcium 8.9 - 10.3 mg/dL 7.7  8.2  7.8     Imaging: MR BRAIN W WO CONTRAST  Result Date: 06/28/2022 IMPRESSION: No evidence of intracranial metastatic disease or other acute intracranial pathology. Electronically Signed   By: Valetta Mole M.D.   On: 06/28/2022 16:39    Assessment/Plan:   Principal Problem:  Tamponade Active Problems:   Type 2 diabetes mellitus with diabetic polyneuropathy (HCC)   Acute on chronic systolic congestive heart failure (HCC)   Small cell lung cancer, right upper lobe ([place])   AKI (acute kidney injury) ([place])   Hypertensive emergency   Patient Summary: Amber Cross is a 61 y.o. female living with HFrEF, type 2 diabetes, small cell lung cancer, CKD 4 who presented with acute on chronic hypoxic respiratory failure secondary to moderate-large pericardial effusion with cardiac tamponade.  Patient underwent subxiphoid pericardiocentesis on 11/21 which collected about 5 cc of clear fluid.  Cytology of the pleural and pericardial fluid were negative for malignancy.  Patient was observed in the ICU after procedure, and slowly was downgraded to progressive.  Current concern, include worsening renal disease requiring dialysis.   #Cardiac tamponade s/p cardio window Improved with intervention. Window  placed given underlying etiology. BP elevated and patient refused prn yesterday (see below). Will ensure good BP control today. CT surgery following. Appreciate their input.  -CTM  #Right pleural effusion s/p chest tube placement Improving. Output decreasing. CT surgery following. Plan to have output be <100 cc and then proceed with pleurodesis prior to talc removal.  -CTM output   #HFrEF #Hypertension Chronic. Hypertensive with elevated BP. On amlodipine 5 mg qd and hydralazine 50 mg BID. Will increase her amlodipine to 10 mg qd and if not controlled then increase her hydralazine to get better control.   # CKD 5, approaching ESRD #Nephrotic range proteinuria #Non anion gapped metabolic acidosis likely in the setting of CKD 5  Started on HD this admission. Was supposed to get fistula placed but that has been rescheduled to 07/01/22. Got HD through catheter with 400 cc initially followed by 1.5 L yesterday. Will get HD today as well. Nephrology following.  -Daily renal panel -Continue HD per nephrology  #Small right apical pneumothorax Noted on xray. Plan to monitor for resolution.   #Stage IA small cell lung cancer in Right upper lobe #Tobacco use disorder Diagnosed in May 2023.  Patient has had radiation treatment in the past. Patient did have CT chest in the hospital in which the nodule was not visible given other overlying airspace opacities.  MRI without any metastatic disease. CT chest once pneumo has resolved.  -Follow-up oncology outpatient  #Anemia of Chronic disease  In the setting of chronic kidney disease. Hgb stable at 8.4. -Continue to monitor -Transfuse if less than 7  #Urinary retention Patient has a foley cath placed since her hospitalization at [facility].  Will keep it in for now to monitor urine output.  Can consider voiding trial after dialysis -Follow-up with urology outpatient  #Type 2 diabetes A1c of 9.7 in July.  A1c 6.7 today. CBG ranges between  139-152. -Continue sliding scale insulin for now.  She only required 2 units yesterday. -Continue SSI and gabapentin  Diet: Carb/Renal IVF: None,None VTE: Heparin Code: Full PT/OT recs: Pending   Dispo: Anticipated discharge to Home in 3 days pending initiating dialysis.   Idamae Schuller, MD 06/29/2022, 6:22 AM Pager: [phone]  Please contact the on call pager after 5 pm and on weekends at [phone].

## 2022-06-29 NOTE — Progress Notes (Addendum)
      [address]       [place] [address]             [phone]         Blood pressure (!) 150/62, pulse 71, temperature 97.9 F (36.6 C), temperature source Oral, resp. rate 12, height 5\' 7"  (1.702 m), weight 68.6 kg, last menstrual period 09/29/2011, SpO2 91 %,   Patient seen on dialysis unit. Chest tube drained 250 ml past 24 hours Now on hemodialysis and hopefully the effusion will resolve as fluid is removed.    Exam: lungs: clear,  The PleurEvac has thin, serous fluid. The pigtail catheter is secure. No Air leak Ext: + BLE pitting edema     Plan to leave the right chest tube in place until output < 100/ day for a few days after fluid removal with HD. Plan chemical pleurodesis with talc prior to removal- We will continue to follow.   Macarthur Critchley, PA-C [phone]   patient examined and last CXR  medical record reviewed,agree with above note. Dahlia Byes 06/29/2022

## 2022-06-30 ENCOUNTER — Inpatient Hospital Stay (HOSPITAL_COMMUNITY): Payer: Commercial Managed Care - HMO

## 2022-06-30 DIAGNOSIS — I314 Cardiac tamponade: Secondary | ICD-10-CM | POA: Diagnosis not present

## 2022-06-30 LAB — RENAL FUNCTION PANEL
Albumin: 2.3 g/dL — ABNORMAL LOW (ref 3.5–5.0)
Anion gap: 9 (ref 5–15)
BUN: 19 mg/dL (ref 6–20)
CO2: 28 mmol/L (ref 22–32)
Calcium: 8 mg/dL — ABNORMAL LOW (ref 8.9–10.3)
Chloride: 101 mmol/L (ref 98–111)
Creatinine, Ser: 2.71 mg/dL — ABNORMAL HIGH (ref 0.44–1.00)
GFR, Estimated: 19 mL/min — ABNORMAL LOW (ref >60)
Glucose, Bld: 124 mg/dL — ABNORMAL HIGH (ref 70–99)
Phosphorus: 3.2 mg/dL (ref 2.5–4.6)
Potassium: 3.2 mmol/L — ABNORMAL LOW (ref 3.5–5.1)
Sodium: 138 mmol/L (ref 135–145)

## 2022-06-30 LAB — CBC WITH DIFFERENTIAL/PLATELET
Abs Immature Granulocytes: 0.04 10*3/uL (ref 0.00–0.07)
Basophils Absolute: 0 10*3/uL (ref 0.0–0.1)
Basophils Relative: 0 %
Eosinophils Absolute: 0 10*3/uL (ref 0.0–0.5)
Eosinophils Relative: 0 %
HCT: 28.2 % — ABNORMAL LOW (ref 36.0–46.0)
Hemoglobin: 9.3 g/dL — ABNORMAL LOW (ref 12.0–15.0)
Immature Granulocytes: 0 %
Lymphocytes Relative: 10 %
Lymphs Abs: 0.9 10*3/uL (ref 0.7–4.0)
MCH: 28.5 pg (ref 26.0–34.0)
MCHC: 33 g/dL (ref 30.0–36.0)
MCV: 86.5 fL (ref 80.0–100.0)
Monocytes Absolute: 0.7 10*3/uL (ref 0.1–1.0)
Monocytes Relative: 7 %
Neutro Abs: 8.1 10*3/uL — ABNORMAL HIGH (ref 1.7–7.7)
Neutrophils Relative %: 83 %
Platelets: 434 10*3/uL — ABNORMAL HIGH (ref 150–400)
RBC: 3.26 MIL/uL — ABNORMAL LOW (ref 3.87–5.11)
RDW: 15.6 % — ABNORMAL HIGH (ref 11.5–15.5)
WBC: 9.8 10*3/uL (ref 4.0–10.5)
nRBC: 0 % (ref 0.0–0.2)

## 2022-06-30 LAB — GLUCOSE, CAPILLARY
Glucose-Capillary: 98 mg/dL (ref 70–99)
Glucose-Capillary: 162 mg/dL — ABNORMAL HIGH (ref 70–99)
Glucose-Capillary: 190 mg/dL — ABNORMAL HIGH (ref 70–99)
Glucose-Capillary: 180 mg/dL — ABNORMAL HIGH (ref 70–99)

## 2022-06-30 MED ORDER — POTASSIUM CHLORIDE CRYS ER 20 MEQ PO TBCR
30.0000 meq | EXTENDED_RELEASE_TABLET | Freq: Once | ORAL | Status: AC
  Administered 2022-06-30: 30 meq via ORAL
  Filled 2022-06-30: qty 1

## 2022-06-30 MED ORDER — SEVELAMER CARBONATE 800 MG PO TABS
800.0000 mg | ORAL_TABLET | Freq: Three times a day (TID) | ORAL | Status: DC
  Administered 2022-06-30 – 2022-07-02 (×6): 800 mg via ORAL
  Filled 2022-06-30 (×6): qty 1

## 2022-06-30 MED ORDER — HYDRALAZINE HCL 50 MG PO TABS
50.0000 mg | ORAL_TABLET | Freq: Three times a day (TID) | ORAL | Status: DC
  Administered 2022-06-30 – 2022-07-02 (×7): 50 mg via ORAL
  Filled 2022-06-30 (×7): qty 1

## 2022-06-30 MED ORDER — VANCOMYCIN HCL IN DEXTROSE 1-5 GM/200ML-% IV SOLN
1000.0000 mg | INTRAVENOUS | Status: AC
  Administered 2022-07-01: 1000 mg via INTRAVENOUS
  Filled 2022-06-30: qty 200

## 2022-06-30 NOTE — Progress Notes (Signed)
Patient seen and examined at bedside. No complaints, excited for the [place] corral that was about to be delivered. Aware of her left-sided fistula creation tomorrow with Dr. Scot Dock.  No questions or concerns.  Make n.p.o. midnight.  Broadus John MD

## 2022-06-30 NOTE — H&P (View-Only) (Signed)
Patient seen and examined at bedside. No complaints, excited for the [place] corral that was about to be delivered. Aware of her left-sided fistula creation tomorrow with Dr. Scot Dock.  No questions or concerns.  Make n.p.o. midnight.  Broadus John MD

## 2022-06-30 NOTE — Progress Notes (Signed)
Mobility Specialist Progress Note:   06/30/22 1050  Mobility  Activity Ambulated with assistance in hallway  Level of Assistance Standby assist, set-up cues, supervision of patient - no hands on  Assistive Device Four wheel walker  Distance Ambulated (ft) 200 ft  Activity Response Tolerated well  Mobility Referral Yes  $Mobility charge 1 Mobility   Pt received in bed and agreeable with encouragement. During ambulation, pt's IV began to bleed, requiring RN assistance and return to room. No complaints of pain or SOB. Pt left sitting EOB with all needs met, call bell in reach, and RN in room.   Andrey Campanile Mobility Specialist Please contact via SecureChat or  Rehab office at [phone]

## 2022-06-30 NOTE — Progress Notes (Addendum)
      [address]       [place] [address]             [phone]      12 Days Post-Op Procedure(s) (LRB): SUBXYPHOID PERICARDIAL WINDOW (N/A) TRANSESOPHAGEAL ECHOCARDIOGRAM (TEE) (N/A) CHEST TUBE INSERTION (Right) Subjective: Sitting up in bed, denies pain or shortness of breath.   Objective: Vital signs in last 24 hours: Temp:  [97.9 F (36.6 C)-98.5 F (36.9 C)] 98.1 F (36.7 C) (12/03 1058) Pulse Rate:  [68-88] 75 (12/03 1200) Cardiac Rhythm: Normal sinus rhythm (12/03 0700) Resp:  [11-20] 13 (12/03 1200) BP: (148-205)/(56-112) 153/75 (12/03 1200) SpO2:  [91 %-97 %] 95 % (12/03 1200) Weight:  [61.4 kg] 61.4 kg (12/03 0741)   Intake/Output from previous day: 12/02 0701 - 12/03 0700 In: 1.1 [IV Piggyback:1.1] Out: 7121 [Urine:150; Chest Tube:380] Intake/Output this shift: Total I/O In: -  Out: 300 [Urine:300]  General appearance: alert, cooperative, and no distress Heart: regular rate and rhythm Lungs: Breath sounds are clear, normal work of breathing. Good sats on RA. Th right pleural tube drained 368ml past 24 hours. Fluid is thin, straw-colored. Wound: the chest tube insertion site is dry, connections secure.  Lab Results: Recent Labs    06/29/22 0948 06/30/22 0013  WBC 8.6 9.8  HGB 8.5* 9.3*  HCT 27.9* 28.2*  PLT 423* 434*   BMET:  Recent Labs    06/29/22 0948 06/30/22 0013  NA 137 138  K 3.4* 3.2*  CL 102 101  CO2 26 28  GLUCOSE 106* 124*  BUN 25* 19  CREATININE 3.06* 2.71*  CALCIUM 7.7* 8.0*    PT/INR: No results for input(s): "LABPROT", "INR" in the last 72 hours. ABG    Component Value Date/Time   PHART 7.240 (L) 06/19/2022 0408   HCO3 19.2 (L) 06/19/2022 0408   TCO2 21 (L) 06/19/2022 0408   ACIDBASEDEF 8.0 (H) 06/19/2022 0408   O2SAT 91 06/19/2022 0408   CBG (last 3)  Recent Labs    06/29/22 2154 06/30/22 0617 06/30/22 1123  GLUCAP 146* 98 162*    Assessment/Plan: S/P Procedure(s) (LRB): SUBXYPHOID  PERICARDIAL WINDOW (N/A) TRANSESOPHAGEAL ECHOCARDIOGRAM (TEE) (N/A) CHEST TUBE INSERTION (Right) -12 days post sub-xiphoid drainage of a large pericardial effusion with no evidence of recurrence.  --Persistent right pleural effusion in setting of known small cell right lung cancer treated with radiation, ESRD, and history of systolic heart failure. Plan to leave the CT in place until drainage less than 177ml/24 hours. Considering chemical pleurodesis when drainage < 100/ day x2. If drainage persists, may benefit from PleurX catheter. CT surgery will continue to follow.    LOS: 14 days    Antony Odea, [place] [phone] 06/30/2022  patient examined and CXR done today reviewed,agree with above note. Dahlia Byes 06/30/2022

## 2022-06-30 NOTE — Progress Notes (Signed)
HD#14 Subjective:  Patient summary: Amber Cross is a 61 year old female living with HFrEF, type 2 diabetes, small cell lung cancer, CKD 4 who presented with acute on chronic hypoxic respiratory failure secondary to moderate-large pericardial effusion with cardiac tamponade.  Patient underwent subxiphoid pericardiocentesis on 11/21 which collected about 5 cc of clear fluid.  Cytology of the pleural and pericardial fluid were negative for malignancy.  Patient was observed in the ICU after procedure, and slowly was downgraded to progressive.  Current concern, include worsening renal disease requiring dialysis.  Overnight events: No overnight events reports   Patient seen at bedside today.  She has no complaints.  She states that she is doing fine.  She denies any chest pain or shortness of breath.  Patient is understanding that she has to fistula operation tomorrow.  Objective:  Vital signs in last 24 hours: Vitals:   06/30/22 0400 06/30/22 0741 06/30/22 1058 06/30/22 1200  BP: (!) 148/56 (!) 182/77 (!) 175/82 (!) 153/75  Pulse: 71 68 73 75  Resp: 15 17 11 13   Temp: 98.4 F (36.9 C) 97.9 F (36.6 C) 98.1 F (36.7 C)   TempSrc: Oral Oral Oral   SpO2: 91% 96% 97% 95%  Weight:  61.4 kg    Height:       Supplemental O2: Room Air 92%   Physical Exam:   General: Patient resting comfortably in bed HENT: Normocephalic, atraumatic  Cardio: Regular rate and rhythm, no murmurs, rubs or gallops  Pulmonary: Clear to ausculation bilaterally with no rales, rhonchi, and crackles.  Patient with chest tube in place, draining well with orange to yellow fluid noted  Abdomen: Soft, nontender with normoactive bowel sounds MSK: 5/5 strength to upper and lower extremities.   Filed Weights   06/28/22 1432 06/29/22 1248 06/30/22 0741  Weight: 64.6 kg 63.6 kg 61.4 kg     Intake/Output Summary (Last 24 hours) at 06/30/2022 1218 Last data filed at 06/30/2022 1143 Gross per 24 hour  Intake 1.11 ml   Output 3330 ml  Net -3328.89 ml   Net IO Since Admission: -6,461.09 mL [06/30/22 1218]  Pertinent Labs:    Latest Ref Rng & Units 06/30/2022   12:13 AM 06/29/2022    9:48 AM 06/28/2022   12:25 AM  CBC  WBC 4.0 - 10.5 K/uL 9.8  8.6  6.6   Hemoglobin 12.0 - 15.0 g/dL 9.3  8.5  8.4   Hematocrit 36.0 - 46.0 % 28.2  27.9  26.6   Platelets 150 - 400 K/uL 434  423  367        Latest Ref Rng & Units 06/30/2022   12:13 AM 06/29/2022    9:48 AM 06/28/2022   12:25 AM  CMP  Glucose 70 - 99 mg/dL 124  106  152   BUN 6 - 20 mg/dL 19  25  55   Creatinine 0.44 - 1.00 mg/dL 2.71  3.06  5.16   Sodium 135 - 145 mmol/L 138  137  136   Potassium 3.5 - 5.1 mmol/L 3.2  3.4  3.9   Chloride 98 - 111 mmol/L 101  102  106   CO2 22 - 32 mmol/L 28  26  21    Calcium 8.9 - 10.3 mg/dL 8.0  7.7  7.7     Imaging: MR BRAIN W WO CONTRAST  Result Date: 06/28/2022 IMPRESSION: No evidence of intracranial metastatic disease or other acute intracranial pathology. Electronically Signed   By: Court Joy.D.  On: 06/28/2022 16:39    Assessment/Plan:   Principal Problem:   Tamponade Active Problems:   Type 2 diabetes mellitus with diabetic polyneuropathy (HCC)   Acute on chronic systolic congestive heart failure (HCC)   Small cell lung cancer, right upper lobe ([place])   AKI (acute kidney injury) ([place])   Hypertensive emergency   Patient Summary: Amber Cross is a 61 y.o. female living with HFrEF, type 2 diabetes, small cell lung cancer, CKD 4 who presented with acute on chronic hypoxic respiratory failure secondary to moderate-large pericardial effusion with cardiac tamponade.  Patient underwent subxiphoid pericardiocentesis on 11/21 which collected about 5 cc of clear fluid.  Cytology of the pleural and pericardial fluid were negative for malignancy.  Patient was observed in the ICU after procedure, and slowly was downgraded to progressive.  Current concern, include worsening renal disease requiring  dialysis.  #Cardiac tamponade s/p cardio window Patient's respiratory status remained stable.  Patient still having elevated blood pressures.  Patient denies any chest pain, shortness of breath.  He denies any vision changes.  Will continue to monitor respiratory status and blood pressure. -Continue to monitor respiratory status  #Right pleural effusion s/p chest tube placement Patient right pleural effusion as slowly improving.  Patient also having hemodialysis.  With combination of chest tube, as well as hemodialysis, right pleural effusion is resolving.  X-ray today shows right apical pneumothorax. -Continue to monitor respiratory status -CT surgery following, appreciate recs -Chest tube draining well -HD tomorrow  #HFrEF #Hypertension Patient has elevated blood pressures to the 170s.  Patient has refused her as needed labetalol.  She currently denies any vision changes, shortness of breath, or chest pain.  Patient currently is on amlodipine 5 mg daily, hydralazine 50 mg twice daily, Coreg 12.5 mg twice daily, and Imdur 30 mg daily.  Blood pressure still is uncontrolled. -Increase to 50 hydralazine 3 times daily -Continue Coreg 12.5 mg twice daily, Imdur 30 mg daily, and amlodipine 10 mg daily  -Continue to monitor blood pressure -Labetalol as needed for blood pressures greater than 160  #ESRD #Nephrotic range proteinuria #Non anion gapped metabolic acidosis likely in the setting of ESRD Patient's creatinine today down to 2.  Patient did have dialysis yesterday in which she became nauseous.  Patient has no complaints this morning.  No HD today, plan for AV fistula tomorrow.  -Daily renal panel -Continue HD per nephrology  #Hypokalemia Potassium 3.2 today.  Will replete with 30 mEq of potassium per nephrology -Recheck renal function panel tomorrow morning  #Small right apical pneumothorax Noted on xray. Plan to monitor for resolution.   #Stage IA small cell lung cancer in Right  upper lobe #Tobacco use disorder Diagnosed in May 2023.  Patient has had radiation treatment in the past. Patient did have CT chest in the hospital in which the nodule was not visible given other overlying airspace opacities.  MRI without any metastatic disease. CT chest once pneumo has resolved.  -Follow-up oncology outpatient  #Anemia of Chronic disease  In the setting of chronic kidney disease. Hgb stable at 9.3. -Continue to monitor -Transfuse if less than 7  #Urinary retention Patient has a foley cath placed since her hospitalization at [facility].  Will keep it in for now to monitor urine output.  Can consider voiding trial after dialysis -Follow-up with urology outpatient  #Type 2 diabetes A1c of 9.7 in July.  A1c 6.7 today. CBG ranges between 124-162 -Continue sliding scale insulin for now.  She only required 2 units  yesterday. -Continue SSI and gabapentin  Diet: Carb/Renal IVF: None,None VTE: Heparin Code: Full PT/OT recs: Pending   Dispo: Anticipated discharge to Home in 3 days pending initiating dialysis.   Leigh Aurora, DO 06/30/2022, 12:18 PM Pager: [phone]  Please contact the on call pager after 5 pm and on weekends at [phone].

## 2022-06-30 NOTE — Progress Notes (Signed)
Nephrology Follow-Up note   Assessment/Recommendations: Amber Cross is a/an 61 y.o. female with a past medical history significant for HFrEF, HTN, DM2, small cell lung cancer, chronic hypoxic respiratory failure, and CKD IV who presents with acute on chronic hypoxic respiratory failure   # AKI on CKD 4 with progression to ESRD: Secondary to hemodynamic mediated process possibly some degree of cardiorenal syndrome. Significant CKD related to diabetes (biopsy proven) with severe interstitial fibrosis and tubular atrophy.  Baseline Cr is 3-3.3 per chartingLess likely to recover.  She has had a pericardial effusion and pleural effusion.  RIJ tunneled dialysis catheter placed on 11/29 with IR.  Started HD on 11/30 for overload including pleural and cardiac effusions  ------------------------ - Next HD on 12/5 (getting AVF on 12/4).  HD per TTS schedule for now - Consulted [facility] for AVF - this is planned for 12/4.  Appreciate their assistance - Consulted HD SW to initiate CLIP process - note that she is ESRD  - Foley was removed - check post-void residual bladder scan and in/out if over 250 ml retained    # Acute on chronic hypoxic respiratory failure: Continue with volume optimization as desired based on cardiac status.  Supplemental oxygen as needed.  Overall improved but overloaded  # Right pleural effusion  - s/p chest tube with IR on 11/29 - optimize volume with HD   # Pericardial effusion: Status post pericardial window on 06/18/2022.  Cytology negative for malignancy. Cardiothoracic surgery following. Started HD and optimizing volume with HD   # Hypertension:  - Increase UF with HD as tolerated - Improved on current regimen - last addition was amlodipine.  May be able to reduce or stop hydralazine once more euvolemic    # HFrEF:  has had minimal urine output and minimal response to lasix IV.  Optimize volume with HD as above   # Hypokalemia  - Replete potassium with 30 meq PO once   #  Metabolic acidosis: likely 2/2 CKD.  To be managed with HD   # Anemia - multifactorial. Iron sat 29.  - Avoiding ESA given malignancy. Transfusion as needed  # Small cell lung cancer: Radiation finished in October.  Per charting she is planning to see oncology in December for restaging.  # Metabolic bone disease - Started HD and improving.  I have reduced  renvela.  Check intact PTH (my order for same was discontinued)  Disposition - continue inpatient monitoring.  We are searching for an outpatient HD unit  ___________________________________________________________  CC: shortness of breath   Interval History/Subjective:  She had 150 mL UOP over 12/2.  Last HD treatment on 12/2 with 2.5 kg UF.  Her foley was removed - no UOP charted after that.  She states that she has had some urine with BM's and doesn't recall issue.     Review of systems:    She denies any shortness of breath at rest or exertion  Denies chest pain  Nausea is better  Medications:  Current Facility-Administered Medications  Medication Dose Route Frequency Provider Last Rate Last Admin   amLODipine (NORVASC) tablet 10 mg  10 mg Oral QPM Idamae Schuller, MD   10 mg at 06/29/22 1721   atorvastatin (LIPITOR) tablet 80 mg  80 mg Oral Daily Barrett, Erin R, PA-C   80 mg at 06/29/22 1337   bisacodyl (DULCOLAX) EC tablet 10 mg  10 mg Oral Daily Wynelle Beckmann C, PA-C   10 mg at 06/29/22 1337   carvedilol (  COREG) tablet 12.5 mg  12.5 mg Oral BID WC Reesa Chew, MD   12.5 mg at 06/30/22 0636   Chlorhexidine Gluconate Cloth 2 % PADS 6 each  6 each Topical Q0600 Claudia Desanctis, MD   6 each at 06/29/22 1300   gabapentin (NEURONTIN) capsule 100 mg  100 mg Oral QHS Wynelle Beckmann C, PA-C   100 mg at 06/29/22 2214   heparin injection 5,000 Units  5,000 Units Subcutaneous Q12H Monia Sabal, PA-C   5,000 Units at 06/29/22 2215   hydrALAZINE (APRESOLINE) tablet 50 mg  50 mg Oral Q12H Idamae Schuller, MD   50 mg at 06/29/22 2216    insulin aspart (novoLOG) injection 0-9 Units  0-9 Units Subcutaneous TID WC & HS Dahlia Byes, MD   1 Units at 06/28/22 2320   isosorbide mononitrate (IMDUR) 24 hr tablet 30 mg  30 mg Oral Daily Geralynn Rile, MD   30 mg at 06/29/22 1336   labetalol (NORMODYNE) injection 10 mg  10 mg Intravenous Q4H PRN Idamae Schuller, MD       lidocaine (XYLOCAINE) 1 % (with pres) injection 10 mL  10 mL Intradermal Once Markus Daft, MD       metoCLOPramide (REGLAN) injection 10 mg  10 mg Intravenous Q12H Dahlia Byes, MD   10 mg at 06/29/22 2214   nitroGLYCERIN (NITROSTAT) SL tablet 0.4 mg  0.4 mg Sublingual Q5 min PRN Magdalene River, PA-C       ondansetron [facility]) injection 4 mg  4 mg Intravenous Q6H PRN Dahlia Byes, MD   4 mg at 06/29/22 0448   Oral care mouth rinse  15 mL Mouth Rinse PRN Dahlia Byes, MD       oxyCODONE (Oxy IR/ROXICODONE) immediate release tablet 5 mg  5 mg Oral Q4H PRN Dahlia Byes, MD   5 mg at 06/28/22 1830   pantoprazole (PROTONIX) EC tablet 40 mg  40 mg Oral Daily Stehler, Pricilla Larsson, PA-C   40 mg at 06/29/22 1336   promethazine (PHENERGAN) 6.25 mg in sodium chloride 0.9 % 50 mL IVPB  6.25 mg Intravenous Q6H PRN Dahlia Byes, MD   Stopped at 06/29/22 6503   senna-docusate (Senokot-S) tablet 1 tablet  1 tablet Oral QHS Magdalene River, PA-C   1 tablet at 06/29/22 2214   sevelamer carbonate (RENVELA) tablet 1,600 mg  1,600 mg Oral TID WC Claudia Desanctis, MD   1,600 mg at 06/29/22 1722   traZODone (DESYREL) tablet 50 mg  50 mg Oral QHS PRN Wynelle Beckmann C, PA-C   50 mg at 06/30/22 0130   [START ON 07/01/2022] vancomycin (VANCOCIN) IVPB 1000 mg/200 mL premix  1,000 mg Intravenous To SSTC Gabriel Earing, PA-C          Physical Exam: Vitals:   06/29/22 2305 06/30/22 0400  BP: (!) 164/77 (!) 148/56  Pulse: 82 71  Resp: 17 15  Temp: 98 F (36.7 C) 98.4 F (36.9 C)  SpO2: 94% 91%   No intake/output data recorded.  Intake/Output Summary (Last 24 hours)  at 06/30/2022 0704 Last data filed at 06/30/2022 0502 Gross per 24 hour  Intake 1.11 ml  Output 3030 ml  Net -3028.89 ml    General adult female in bed in no acute distress   HEENT normocephalic atraumatic extraocular movements intact sclera anicteric Neck supple trachea midline Lungs crackles at left base and right pleural rub; normal work of breathing at rest; room air.  Chest tube in  Heart S1S2  Abdomen soft nontender nondistended Extremities trace to 1+ edema bilateral lower extremities Psych normal mood and affect Neuro oriented to person, location, and time and is conversant. Access RIJ tunn catheter in place   Test Results I personally reviewed new and old clinical labs and radiology tests Lab Results  Component Value Date   NA 138 06/30/2022   K 3.2 (L) 06/30/2022   CL 101 06/30/2022   CO2 28 06/30/2022   BUN 19 06/30/2022   CREATININE 2.71 (H) 06/30/2022   CALCIUM 8.0 (L) 06/30/2022   ALBUMIN 2.3 (L) 06/30/2022   PHOS 3.2 06/30/2022    CBC Recent Labs  Lab 06/28/22 0025 06/29/22 0948 06/30/22 0013  WBC 6.6 8.6 9.8  NEUTROABS  --   --  8.1*  HGB 8.4* 8.5* 9.3*  HCT 26.6* 27.9* 28.2*  MCV 87.5 90.0 86.5  PLT 367 423* 434*    Claudia Desanctis, MD 7:23 AM 06/30/2022

## 2022-07-01 ENCOUNTER — Encounter (HOSPITAL_COMMUNITY)
Admission: EM | Disposition: A | Discharge status: Home / Self Care | Payer: Self-pay | Source: Home / Self Care | Attending: Cardiothoracic Surgery

## 2022-07-01 ENCOUNTER — Inpatient Hospital Stay (HOSPITAL_COMMUNITY): Payer: Commercial Managed Care - HMO | Admitting: Certified Registered Nurse Anesthetist

## 2022-07-01 ENCOUNTER — Inpatient Hospital Stay: Payer: Commercial Managed Care - HMO | Attending: Radiation Oncology

## 2022-07-01 ENCOUNTER — Encounter (HOSPITAL_COMMUNITY): Payer: Self-pay | Admitting: Cardiothoracic Surgery

## 2022-07-01 ENCOUNTER — Other Ambulatory Visit: Payer: Self-pay

## 2022-07-01 DIAGNOSIS — N186 End stage renal disease: Secondary | ICD-10-CM

## 2022-07-01 DIAGNOSIS — I5020 Unspecified systolic (congestive) heart failure: Secondary | ICD-10-CM | POA: Diagnosis not present

## 2022-07-01 DIAGNOSIS — N185 Chronic kidney disease, stage 5: Secondary | ICD-10-CM

## 2022-07-01 DIAGNOSIS — E872 Acidosis, unspecified: Secondary | ICD-10-CM

## 2022-07-01 DIAGNOSIS — I509 Heart failure, unspecified: Secondary | ICD-10-CM | POA: Diagnosis not present

## 2022-07-01 DIAGNOSIS — I132 Hypertensive heart and chronic kidney disease with heart failure and with stage 5 chronic kidney disease, or end stage renal disease: Secondary | ICD-10-CM | POA: Diagnosis not present

## 2022-07-01 DIAGNOSIS — F17210 Nicotine dependence, cigarettes, uncomplicated: Secondary | ICD-10-CM

## 2022-07-01 DIAGNOSIS — E1122 Type 2 diabetes mellitus with diabetic chronic kidney disease: Secondary | ICD-10-CM

## 2022-07-01 HISTORY — PX: AV FISTULA PLACEMENT: SHX1204

## 2022-07-01 LAB — GLUCOSE, CAPILLARY
Glucose-Capillary: 108 mg/dL — ABNORMAL HIGH (ref 70–99)
Glucose-Capillary: 111 mg/dL — ABNORMAL HIGH (ref 70–99)
Glucose-Capillary: 144 mg/dL — ABNORMAL HIGH (ref 70–99)
Glucose-Capillary: 123 mg/dL — ABNORMAL HIGH (ref 70–99)
Glucose-Capillary: 187 mg/dL — ABNORMAL HIGH (ref 70–99)

## 2022-07-01 LAB — CBC
HCT: 27.6 % — ABNORMAL LOW (ref 36.0–46.0)
Hemoglobin: 8.7 g/dL — ABNORMAL LOW (ref 12.0–15.0)
MCH: 27.5 pg (ref 26.0–34.0)
MCHC: 31.5 g/dL (ref 30.0–36.0)
MCV: 87.3 fL (ref 80.0–100.0)
Platelets: 451 10*3/uL — ABNORMAL HIGH (ref 150–400)
RBC: 3.16 MIL/uL — ABNORMAL LOW (ref 3.87–5.11)
RDW: 15.4 % (ref 11.5–15.5)
WBC: 10.3 10*3/uL (ref 4.0–10.5)
nRBC: 0 % (ref 0.0–0.2)

## 2022-07-01 LAB — RENAL FUNCTION PANEL
Albumin: 2.1 g/dL — ABNORMAL LOW (ref 3.5–5.0)
Anion gap: 9 (ref 5–15)
BUN: 23 mg/dL — ABNORMAL HIGH (ref 6–20)
CO2: 25 mmol/L (ref 22–32)
Calcium: 7.7 mg/dL — ABNORMAL LOW (ref 8.9–10.3)
Chloride: 98 mmol/L (ref 98–111)
Creatinine, Ser: 3.48 mg/dL — ABNORMAL HIGH (ref 0.44–1.00)
GFR, Estimated: 14 mL/min — ABNORMAL LOW (ref >60)
Glucose, Bld: 150 mg/dL — ABNORMAL HIGH (ref 70–99)
Phosphorus: 3.9 mg/dL (ref 2.5–4.6)
Potassium: 3.4 mmol/L — ABNORMAL LOW (ref 3.5–5.1)
Sodium: 132 mmol/L — ABNORMAL LOW (ref 135–145)

## 2022-07-01 SURGERY — ARTERIOVENOUS (AV) FISTULA CREATION
Anesthesia: Monitor Anesthesia Care | Site: Arm Lower | Laterality: Left

## 2022-07-01 MED ORDER — HEPARIN SODIUM (PORCINE) 1000 UNIT/ML IJ SOLN
INTRAMUSCULAR | Status: AC
  Filled 2022-07-01: qty 10

## 2022-07-01 MED ORDER — VANCOMYCIN HCL IN DEXTROSE 1-5 GM/200ML-% IV SOLN: 1000.0000 mg | INTRAVENOUS | Status: DC

## 2022-07-01 MED ORDER — PROTAMINE SULFATE 10 MG/ML IV SOLN
INTRAVENOUS | Status: AC
  Filled 2022-07-01: qty 5

## 2022-07-01 MED ORDER — ROPIVACAINE HCL 5 MG/ML IJ SOLN
INTRAMUSCULAR | Status: DC | PRN
  Administered 2022-07-01: 30 mL via PERINEURAL

## 2022-07-01 MED ORDER — SODIUM CHLORIDE 0.9 % IV SOLN: INTRAVENOUS | Status: DC | PRN

## 2022-07-01 MED ORDER — CHLORHEXIDINE GLUCONATE 4 % EX LIQD: 60.0000 mL | Freq: Once | CUTANEOUS | Status: DC

## 2022-07-01 MED ORDER — PROPOFOL 500 MG/50ML IV EMUL
INTRAVENOUS | Status: DC | PRN
  Administered 2022-07-01: 15 ug/kg/min via INTRAVENOUS

## 2022-07-01 MED ORDER — OXYCODONE HCL 5 MG PO TABS: 5.0000 mg | ORAL_TABLET | Freq: Once | ORAL | Status: DC | PRN

## 2022-07-01 MED ORDER — PROTAMINE SULFATE 10 MG/ML IV SOLN
INTRAVENOUS | Status: DC | PRN
  Administered 2022-07-01 (×2): 20 mg via INTRAVENOUS

## 2022-07-01 MED ORDER — DEXAMETHASONE SODIUM PHOSPHATE 10 MG/ML IJ SOLN
INTRAMUSCULAR | Status: AC
  Filled 2022-07-01: qty 1

## 2022-07-01 MED ORDER — PHENYLEPHRINE 80 MCG/ML (10ML) SYRINGE FOR IV PUSH (FOR BLOOD PRESSURE SUPPORT)
PREFILLED_SYRINGE | INTRAVENOUS | Status: DC | PRN
  Administered 2022-07-01 (×2): 80 ug via INTRAVENOUS

## 2022-07-01 MED ORDER — LIDOCAINE 2% (20 MG/ML) 5 ML SYRINGE
INTRAMUSCULAR | Status: AC
  Filled 2022-07-01: qty 5

## 2022-07-01 MED ORDER — 0.9 % SODIUM CHLORIDE (POUR BTL) OPTIME
TOPICAL | Status: DC | PRN
  Administered 2022-07-01: 1000 mL

## 2022-07-01 MED ORDER — HEPARIN SODIUM (PORCINE) 1000 UNIT/ML IJ SOLN
INTRAMUSCULAR | Status: DC | PRN
  Administered 2022-07-01: 6000 [IU] via INTRAVENOUS

## 2022-07-01 MED ORDER — MIDAZOLAM HCL 2 MG/2ML IJ SOLN
INTRAMUSCULAR | Status: AC
  Filled 2022-07-01: qty 2

## 2022-07-01 MED ORDER — ROCURONIUM BROMIDE 10 MG/ML (PF) SYRINGE
PREFILLED_SYRINGE | INTRAVENOUS | Status: AC
  Filled 2022-07-01: qty 10

## 2022-07-01 MED ORDER — ONDANSETRON HCL 4 MG/2ML IJ SOLN
INTRAMUSCULAR | Status: AC
  Filled 2022-07-01: qty 2

## 2022-07-01 MED ORDER — HYDROMORPHONE HCL 2 MG PO TABS
2.0000 mg | ORAL_TABLET | Freq: Four times a day (QID) | ORAL | Status: DC | PRN
  Administered 2022-07-01: 2 mg via ORAL
  Filled 2022-07-01: qty 1

## 2022-07-01 MED ORDER — CHLORHEXIDINE GLUCONATE CLOTH 2 % EX PADS
6.0000 | MEDICATED_PAD | Freq: Every day | CUTANEOUS | Status: DC
  Administered 2022-07-02: 6 via TOPICAL

## 2022-07-01 MED ORDER — HEPARIN 6000 UNIT IRRIGATION SOLUTION
Status: AC
  Filled 2022-07-01: qty 500

## 2022-07-01 MED ORDER — HEPARIN 6000 UNIT IRRIGATION SOLUTION
Status: DC | PRN
  Administered 2022-07-01: 1

## 2022-07-01 MED ORDER — FENTANYL CITRATE (PF) 250 MCG/5ML IJ SOLN
INTRAMUSCULAR | Status: DC | PRN
  Administered 2022-07-01 (×4): 25 ug via INTRAVENOUS

## 2022-07-01 MED ORDER — OXYCODONE HCL 5 MG/5ML PO SOLN: 5.0000 mg | Freq: Once | ORAL | Status: DC | PRN

## 2022-07-01 MED ORDER — FENTANYL CITRATE (PF) 250 MCG/5ML IJ SOLN
INTRAMUSCULAR | Status: AC
  Filled 2022-07-01: qty 5

## 2022-07-01 MED ORDER — MIDAZOLAM HCL 2 MG/2ML IJ SOLN
INTRAMUSCULAR | Status: DC | PRN
  Administered 2022-07-01 (×2): 1 mg via INTRAVENOUS

## 2022-07-01 MED ORDER — ONDANSETRON HCL 4 MG/2ML IJ SOLN
INTRAMUSCULAR | Status: DC | PRN
  Administered 2022-07-01: 4 mg via INTRAVENOUS

## 2022-07-01 MED ORDER — SODIUM CHLORIDE 0.9 % IV SOLN: INTRAVENOUS | Status: DC

## 2022-07-01 MED ORDER — FENTANYL CITRATE (PF) 100 MCG/2ML IJ SOLN: 25.0000 ug | INTRAMUSCULAR | Status: DC | PRN

## 2022-07-01 MED ORDER — PROPOFOL 10 MG/ML IV BOLUS
INTRAVENOUS | Status: AC
  Filled 2022-07-01: qty 20

## 2022-07-01 MED ORDER — PHENYLEPHRINE 80 MCG/ML (10ML) SYRINGE FOR IV PUSH (FOR BLOOD PRESSURE SUPPORT)
PREFILLED_SYRINGE | INTRAVENOUS | Status: AC
  Filled 2022-07-01: qty 10

## 2022-07-01 SURGICAL SUPPLY — 31 items
ADH SKN CLS APL DERMABOND .7 (GAUZE/BANDAGES/DRESSINGS) ×1
ARMBAND PINK RESTRICT EXTREMIT (MISCELLANEOUS) ×4 IMPLANT
BAG COUNTER SPONGE SURGICOUNT (BAG) ×2 IMPLANT
BAG SPNG CNTER NS LX DISP (BAG) ×1
CANISTER SUCT 3000ML PPV (MISCELLANEOUS) ×2 IMPLANT
CANNULA VESSEL 3MM 2 BLNT TIP (CANNULA) ×2 IMPLANT
CLIP VESOCCLUDE MED 6/CT (CLIP) ×2 IMPLANT
CLIP VESOCCLUDE SM WIDE 6/CT (CLIP) ×2 IMPLANT
COVER PROBE W GEL 5X96 (DRAPES) IMPLANT
DERMABOND ADVANCED .7 DNX12 (GAUZE/BANDAGES/DRESSINGS) ×2 IMPLANT
ELECT REM PT RETURN 9FT ADLT (ELECTROSURGICAL) ×1
ELECTRODE REM PT RTRN 9FT ADLT (ELECTROSURGICAL) ×2 IMPLANT
GLOVE BIO SURGEON STRL SZ7.5 (GLOVE) ×2 IMPLANT
GLOVE BIOGEL PI IND STRL 8 (GLOVE) ×2 IMPLANT
GOWN STRL REUS W/ TWL LRG LVL3 (GOWN DISPOSABLE) ×6 IMPLANT
GOWN STRL REUS W/TWL LRG LVL3 (GOWN DISPOSABLE) ×3
KIT BASIN OR (CUSTOM PROCEDURE TRAY) ×2 IMPLANT
KIT TURNOVER KIT B (KITS) ×2 IMPLANT
NS IRRIG 1000ML POUR BTL (IV SOLUTION) ×2 IMPLANT
PACK CV ACCESS (CUSTOM PROCEDURE TRAY) ×2 IMPLANT
PAD ARMBOARD 7.5X6 YLW CONV (MISCELLANEOUS) ×4 IMPLANT
SLING ARM FOAM STRAP LRG (SOFTGOODS) IMPLANT
SLING ARM FOAM STRAP MED (SOFTGOODS) IMPLANT
SPONGE SURGIFOAM ABS GEL 100 (HEMOSTASIS) IMPLANT
SUT MNCRL AB 4-0 PS2 18 (SUTURE) ×2 IMPLANT
SUT PROLENE 6 0 BV (SUTURE) ×2 IMPLANT
SUT VIC AB 3-0 SH 27 (SUTURE) ×2
SUT VIC AB 3-0 SH 27X BRD (SUTURE) ×2 IMPLANT
TOWEL GREEN STERILE (TOWEL DISPOSABLE) ×2 IMPLANT
UNDERPAD 30X36 HEAVY ABSORB (UNDERPADS AND DIAPERS) ×2 IMPLANT
WATER STERILE IRR 1000ML POUR (IV SOLUTION) ×2 IMPLANT

## 2022-07-01 NOTE — Anesthesia Procedure Notes (Signed)
Anesthesia Regional Block: Supraclavicular block   Pre-Anesthetic Checklist: , timeout performed,  Correct Patient, Correct Site, Correct Laterality,  Correct Procedure, Correct Position, site marked,  Risks and benefits discussed,  Surgical consent,  Pre-op evaluation,  At surgeon's request and post-op pain management  Laterality: Left  Prep: chloraprep       Needles:  Injection technique: Single-shot  Needle Type: Echogenic Stimulator Needle     Needle Length: 5cm  Needle Gauge: 22     Additional Needles:   Procedures:, nerve stimulator,,,,,     Nerve Stimulator or Paresthesia:  Response: biceps flexion, 0.45 mA  Additional Responses:   Narrative:  Start time: 07/01/2022 7:01 AM End time: 07/01/2022 7:12 AM Injection made incrementally with aspirations every 5 mL.  Performed by: Personally  Anesthesiologist: Albertha Ghee, MD  Additional Notes: Functioning IV was confirmed and monitors were applied.  A 51mm 22ga Arrow echogenic stimulator needle was used. Sterile prep and drape,hand hygiene and sterile gloves were used.  Negative aspiration and negative test dose prior to incremental administration of local anesthetic. The patient tolerated the procedure well.  Ultrasound guidance: relevent anatomy identified, needle position confirmed, local anesthetic spread visualized around nerve(s), vascular puncture avoided.  Image printed for medical record.

## 2022-07-01 NOTE — Progress Notes (Signed)
HD#15 Subjective:  Patient summary: Amber Cross is a 61 year old female living with HFrEF, type 2 diabetes, small cell lung cancer, CKD 4 who presented with acute on chronic hypoxic respiratory failure secondary to moderate-large pericardial effusion with cardiac tamponade.  Patient underwent subxiphoid pericardiocentesis on 11/21 which collected about 5 cc of clear fluid.  Cytology of the pleural and pericardial fluid were negative for malignancy.  Patient was observed in the ICU after procedure, and slowly was downgraded to progressive.  Current concern, include worsening renal disease requiring dialysis.  Overnight events: No overnight events reports   Patient is postop after getting AV fistula on my exam. Patient reports she is doing great.  She states that she would like something to eat.  She denies any pain.  She denies any shortness of breath or chest pain.  She has no concerns at this point.  Objective:  Vital signs in last 24 hours: Vitals:   06/30/22 1924 06/30/22 2333 07/01/22 0403 07/01/22 0543  BP: (!) 167/76 (!) 184/80 (!) 158/74   Pulse: 75 85 78   Resp: 19 12 16    Temp: 97.9 F (36.6 C) 98 F (36.7 C) 98.6 F (37 C)   TempSrc: Oral Oral Oral   SpO2: 92% 92% 93%   Weight:    61.2 kg  Height:       Supplemental O2: Room Air 93%   Physical Exam:   General: Patient resting comfortably in bed in no acute distress HENT: Normocephalic, atraumatic  Cardio: Regular rate and rhythm, no murmurs, rubs or gallops  Pulmonary: Decreased breath sounds to the right lower lung base otherwise clear to auscultation bilaterally. Abdomen: Soft, nontender with normoactive bowel sounds Extremity: Left upper extremity with AV graft site with good closure, with no oozing or bleeding noted.  Left arm in sling.  Filed Weights   06/29/22 1248 06/30/22 0741 07/01/22 0543  Weight: 63.6 kg 61.4 kg 61.2 kg     Intake/Output Summary (Last 24 hours) at 07/01/2022 0753 Last data filed  at 07/01/2022 0735 Gross per 24 hour  Intake 440 ml  Output 500 ml  Net -60 ml    Net IO Since Admission: -6,221.09 mL [07/01/22 0753]  Pertinent Labs:    Latest Ref Rng & Units 07/01/2022   12:26 AM 06/30/2022   12:13 AM 06/29/2022    9:48 AM  CBC  WBC 4.0 - 10.5 K/uL 10.3  9.8  8.6   Hemoglobin 12.0 - 15.0 g/dL 8.7  9.3  8.5   Hematocrit 36.0 - 46.0 % 27.6  28.2  27.9   Platelets 150 - 400 K/uL 451  434  423        Latest Ref Rng & Units 07/01/2022   12:26 AM 06/30/2022   12:13 AM 06/29/2022    9:48 AM  CMP  Glucose 70 - 99 mg/dL 150  124  106   BUN 6 - 20 mg/dL 23  19  25    Creatinine 0.44 - 1.00 mg/dL 3.48  2.71  3.06   Sodium 135 - 145 mmol/L 132  138  137   Potassium 3.5 - 5.1 mmol/L 3.4  3.2  3.4   Chloride 98 - 111 mmol/L 98  101  102   CO2 22 - 32 mmol/L 25  28  26    Calcium 8.9 - 10.3 mg/dL 7.7  8.0  7.7     Imaging: MR BRAIN W WO CONTRAST  Result Date: 06/28/2022 IMPRESSION: No evidence of intracranial metastatic  disease or other acute intracranial pathology. Electronically Signed   By: Valetta Mole M.D.   On: 06/28/2022 16:39    Assessment/Plan:   Principal Problem:   Tamponade Active Problems:   Type 2 diabetes mellitus with diabetic polyneuropathy (HCC)   Acute on chronic systolic congestive heart failure (HCC)   Small cell lung cancer, right upper lobe ([place])   AKI (acute kidney injury) ([place])   Hypertensive emergency   Patient Summary: Amber Cross is a 61 y.o. female living with HFrEF, type 2 diabetes, small cell lung cancer, CKD 4 who presented with acute on chronic hypoxic respiratory failure secondary to moderate-large pericardial effusion with cardiac tamponade.  Patient underwent subxiphoid pericardiocentesis on 11/21 which collected about 5 cc of clear fluid.  Cytology of the pleural and pericardial fluid were negative for malignancy.  Patient was observed in the ICU after procedure, and slowly was downgraded to progressive.  Current concern,  include worsening renal disease requiring dialysis.  #Cardiac tamponade s/p pericardial window, resolved  Patient's respiratory status remains stable.  Patient still having elevated blood pressures.  Patient denies any chest pain, shortness of breath.  He denies any vision changes.  Will continue to monitor respiratory status and blood pressure. -Continue to monitor respiratory status  #ESRD #Nephrotic range proteinuria #Non anion gapped metabolic acidosis likely in the setting of ESRD Patient had dialysis 2 days ago, and is planning to get dialysis tomorrow.  Patient's potassium 3.4, sodium 132, creatinine 3.48.  GFR is 14.  Patient had her AV fistula placed today on left arm.  Will need to wait for it to mature.  Will continue dialysis through tunneled catheter.  Creatinine today at 3.48.  Patient has no concerns today.  Patient has been accepted for outpatient dialysis, and will plan to transition to outpatient dialysis when patient stable to discharge to the hospital. -Daily renal panel -Continue HD per nephrology  #Right pleural effusion s/p chest tube placement, resolving Chest x-ray yesterday showing pleural effusion and slowly resolving.  Patient has had good output from her chest tube, with about 100 mL over the past 24 hours.  Plan will be to potentially remove chest tube tomorrow, if less than 100 mL's output again.  CT surgery planning pleurodesis and removing chest tube as early as tomorrow. -CT surgery following, appreciate recs -HD tomorrow -Continue to monitor chest tube output  #HFrEF #Hypertension Patient's blood pressures have been elevated into the 150s and 160s.  Patient is currently on hydralazine 50 mg 3 times daily, Coreg 12.5 mg twice daily, Imdur 30 mg daily, and amlodipine 10 mg daily. -Continue Coreg 12.5 mg twice daily, Imdur 30 mg daily, amlodipine 10 mg daily, hydralazine 50 mg 3 times daily -Continue to monitor blood pressures  #Hypokalemia Potassium 3.4  today.  Patient has HD tomorrow. -Recheck renal panel tomorrow  #Small right apical pneumothorax Lateral right apex pneumothorax noted on x-ray which is stable. -Continue to monitor respiratory status  #Stage IA small cell lung cancer in Right upper lobe #Tobacco use disorder Diagnosed in May 2023.  Patient has had radiation treatment in the past. Patient did have CT chest in the hospital in which the nodule was not visible given other overlying airspace opacities. -MRI brain showing no mets   #Anemia of Chronic disease  In the setting of chronic kidney disease. Hgb stable at 8.7. -Continue to monitor -Transfuse if less than 7  #Type 2 diabetes A1c of 9.7 in July.  A1c 6.7 today. CBG ranges between 123-150 -  Continue sliding scale insulin for now.  She only required 2 units yesterday. -Continue SSI and gabapentin  Diet: Carb/Renal IVF: None,None VTE: Heparin Code: Full PT/OT recs: Pending   Dispo: Anticipated discharge to Home in 3 days pending initiating dialysis.   Leigh Aurora, DO 07/01/2022, 7:53 AM Pager: [phone]  Please contact the on call pager after 5 pm and on weekends at [phone].

## 2022-07-01 NOTE — Interval H&P Note (Signed)
History and Physical Interval Note:  07/01/2022 7:10 AM  Amber Cross  has presented today for surgery, with the diagnosis of Chronic Kidney Disease Stage IV.  The various methods of treatment have been discussed with the patient and family. After consideration of risks, benefits and other options for treatment, the patient has consented to  Procedure(s): LEFT ARTERIOVENOUS (AV) FISTULA CREATION VERSUS GRAFT (Left) as a surgical intervention.  The patient's history has been reviewed, patient examined, no change in status, stable for surgery.  I have reviewed the patient's chart and labs.  Questions were answered to the patient's satisfaction.     [name]

## 2022-07-01 NOTE — Progress Notes (Signed)
Pt evaluated at bedside for 9/10 AV fistula pain. Patient had fistula created today and on exam there are an audible bruit and strong palpable thrill present. There is surrounding non-streaking erythema and swelling most likely post surgical inflammatory changes. Oxycodone 5mg  prn has not helped the pain. Will start oral dilauded 2mg  q6 hrs prn.

## 2022-07-01 NOTE — Progress Notes (Signed)
New Dialysis Start   Patient identified as new dialysis start. Kidney Education packet assembled and given. Discussed the following items with patient:    Current medications and possible changes once started:  Discussed that patient's medications may change over time.  Ex; hypertension medications and diabetes medication.  Nephrologists will adjust as needed.  Fluid restrictions reviewed:  32 oz daily goal:  All liquids count; soups, ice, jello   Phosphorus and potassium: Handout given showing high potassium and phosphorus foods.  Alternative food and drink options given.  Family support:  no family present  Outpatient Clinic Resources:  Discussed roles of Outpatient clinic  staff and advised to make a list of needs, if any, to talk with outpatient staff if needed  Care plan schedule: Informed patient  of Care Plans in outpatient setting and to participate in the care plan.  An invitation would be given from outpatient clinic.   Dialysis Access Options:  Reviewed access options with patients. Discussed in detail about care at home with new AVG & AVF. Reviewed checking bruit and thrill. If dialysis catheter present, educated that patient could not take showers.  Catheter dressing changes were to be done by outpatient clinic staff only  Home therapy options:  Educated patient about home therapy options:  PD vs home hemo.     Patient verbalized understanding. Will continue to round on patient during admission.    Devynn Hessler W Zamari Vea, RN   

## 2022-07-01 NOTE — Progress Notes (Signed)
CT surgery  Patient feeling much better after initiation of dialysis Chest tube output is decreased to less than 100 cc of serous fluid daily. Will plan injection of talc pleurodesis into right pleural space then remove chest tube tomorrow.  Blood pressure 133/66, pulse 76, temperature (!) 97.2 F (36.2 C), temperature source Oral, resp. rate 13, height 5\' 7"  (1.702 m), weight 61.2 kg, last menstrual period 09/29/2011, SpO2 94 %, not currently breastfeeding.

## 2022-07-01 NOTE — Op Note (Signed)
    NAME: Amber Cross    MRN: [id number] DOB: Dec 04, 1960    DATE OF OPERATION: 07/01/2022  PREOP DIAGNOSIS:    End-stage renal disease  POSTOP DIAGNOSIS:    Same  PROCEDURE:    Left brachiocephalic AV fistula  SURGEON: Judeth Cornfield. Scot Dock, MD  ASSIST: Leontine Locket, PA  ANESTHESIA: Block  EBL: Minimal  INDICATIONS:    Amber Cross is a 61 y.o. female who presents for new access.  She has a functioning dialysis catheter.  FINDINGS:   4 mm upper arm cephalic vein.  Excellent thrill at the completion of the procedure with a palpable radial pulse  TECHNIQUE:   The patient was taken to the operating room and the left arm was prepped and draped in usual sterile fashion.  A block had been placed by anesthesia.  I looked at the cephalic vein myself with the SonoSite and I felt that it was reasonable in size in the upper arm.  The vein was lateral in relation to the artery which was much more medial.  I elected to make 2 separate incisions.  A longitudinal incision was made over the cephalic vein.  The vein was dissected free with branches divided between clips and 3-0 silk ties.  It was ligated distally and irrigated up with heparinized saline.  It was about a 4 mm vein.  There was a small amount of old thrombus present which I was easily able to remove.  Separate longitudinal incision was made over the brachial artery.  Brachial artery was controlled and the patient was heparinized.  A tunnel was created between the 2 incisions and the vein was brought over for anastomosis to the brachial artery.  It had been marked to prevent twisting.  The brachial artery was clamped proximally and distally and a longitudinal arteriotomy was made.  The vein was sewn into side to the artery using continuous 6-0 Prolene suture.  At the completion there was an excellent thrill in the fistula and a palpable radial pulse.  The heparin was partially reversed with protamine.  Hemostasis was obtained  in the wounds.  The wounds were each closed with a deep layer of 3-0 Vicryl and the skin closed with 4-0 Monocryl.  Dermabond was applied.  The patient tolerated the procedure well was transferred to the recovery room in stable condition.  All needle and sponge counts were correct.  Given the complexity of the case,  the assistant was necessary in order to expedient the procedure and safely perform the technical aspects of the operation.  The assistant provided traction and countertraction to assist with exposure of the artery and vein.  They also assisted with suture ligation of multiple venous branches.  They played a critical role in the anastomosis. These skills, especially following the Prolene suture for the anastomosis, could not have been adequately performed by a scrub tech assistant.    [name], MD, FACS [facility]  DATE OF DICTATION:   07/01/2022

## 2022-07-01 NOTE — Progress Notes (Addendum)
Contacted [facility] admissions this morning to request an update on pt's referral from Friday. Awaiting a response.   Amber Cross Renal Navigator [phone]  Addendum at 12:09 pm: Pt has been accepted at [facility] MWF 6:50 am chair time. Pt can start on Wednesday and needs to arrive at 6:00 am to complete paperwork prior to first treatment. Met with pt at bedside to discuss above arrangements. Schedule letter provided to pt with details noted as well. Pt agreeable to plan. Pt inquiring about possible transportation options at d/c to/from HD appts. Pt states she can likely make arrangements for transportation for the first few appts but would like to speak to someone regarding options/resources. Contacted CSW with [facility] to request that staff meet with pt as able to assist with resources. Update provided to treatment team via secure chat. Will add arrangements to AVS as well.

## 2022-07-01 NOTE — Anesthesia Preprocedure Evaluation (Signed)
Anesthesia Evaluation  Patient identified by MRN, date of birth, ID band Patient awake    Reviewed: Allergy & Precautions, H&P , NPO status , Patient's Chart, lab work & pertinent test results  Airway Mallampati: II   Neck ROM: full    Dental   Pulmonary Current Smoker   breath sounds clear to auscultation       Cardiovascular hypertension, +CHF   Rhythm:regular Rate:Normal     Neuro/Psych  Headaches PSYCHIATRIC DISORDERS  Depression     Neuromuscular disease CVA    GI/Hepatic   Endo/Other  diabetes, Type 2    Renal/GU ESRFRenal disease     Musculoskeletal   Abdominal   Peds  Hematology   Anesthesia Other Findings   Reproductive/Obstetrics                             Anesthesia Physical Anesthesia Plan  ASA: 3  Anesthesia Plan: MAC and Regional   Post-op Pain Management:    Induction: Intravenous  PONV Risk Score and Plan: 1 and Propofol infusion, Treatment may vary due to age or medical condition, Midazolam and Ondansetron  Airway Management Planned: Simple Face Mask  Additional Equipment:   Intra-op Plan:   Post-operative Plan:   Informed Consent: I have reviewed the patients History and Physical, chart, labs and discussed the procedure including the risks, benefits and alternatives for the proposed anesthesia with the patient or authorized representative who has indicated his/her understanding and acceptance.     Dental advisory given  Plan Discussed with: CRNA, Anesthesiologist and Surgeon  Anesthesia Plan Comments:        Anesthesia Quick Evaluation

## 2022-07-01 NOTE — Discharge Instructions (Signed)
   [facility]  Discharge Instructions  AV Fistula or Graft Surgery for Dialysis Access  Please refer to the following instructions for your post-procedure care. Your surgeon or physician assistant will discuss any changes with you.  Activity  You may drive the day following your surgery, if you are comfortable and no longer taking prescription pain medication. Resume full activity as the soreness in your incision resolves.  Bathing/Showering  You may shower after you go home. Keep your incision dry for 48 hours. Do not soak in a bathtub, hot tub, or swim until the incision heals completely. You may not shower if you have a hemodialysis catheter.  Incision Care  Clean your incision with mild soap and water after 48 hours. Pat the area dry with a clean towel. You do not need a bandage unless otherwise instructed. Do not apply any ointments or creams to your incision. You may have skin glue on your incision. Do not peel it off. It will come off on its own in about one week. Your arm may swell a bit after surgery. To reduce swelling use pillows to elevate your arm so it is above your heart. Your doctor will tell you if you need to lightly wrap your arm with an ACE bandage.  Diet  Resume your normal diet. There are not special food restrictions following this procedure. In order to heal from your surgery, it is CRITICAL to get adequate nutrition. Your body requires vitamins, minerals, and protein. Vegetables are the best source of vitamins and minerals. Vegetables also provide the perfect balance of protein. Processed food has little nutritional value, so try to avoid this.  Medications  Resume taking all of your medications. If your incision is causing pain, you may take over-the counter pain relievers such as acetaminophen (Tylenol). If you were prescribed a stronger pain medication, please be aware these medications can cause nausea and constipation. Prevent  nausea by taking the medication with a snack or meal. Avoid constipation by drinking plenty of fluids and eating foods with high amount of fiber, such as fruits, vegetables, and grains.  Do not take Tylenol if you are taking prescription pain medications.  Follow up Your surgeon may want to see you in the office following your access surgery. If so, this will be arranged at the time of your surgery.  Please call us immediately for any of the following conditions:  Increased pain, redness, drainage (pus) from your incision site Fever of 101 degrees or higher Severe or worsening pain at your incision site Hand pain or numbness.  Reduce your risk of vascular disease:  Stop smoking. If you would like help, call [facility] at 1-800-QUIT-NOW 631-509-2875) or [place] [facility] [place] your cholesterol Maintain a desired weight Control your diabetes Keep your blood pressure down  Dialysis  It will take several weeks to several months for your new dialysis access to be ready for use. Your surgeon will determine when it is okay to use it. Your nephrologist will continue to direct your dialysis. You can continue to use your Permcath until your new access is ready for use.   07/01/2022 Amber Cross [id number] 11-05-1960  Surgeon(s): Angelia Mould, MD  Procedure(s): LEFT ARTERIOVENOUS (AV) FISTULA CREATION  x Do not stick fistula for 12 weeks    If you have any questions, please call the office at [phone].

## 2022-07-01 NOTE — Transfer of Care (Signed)
Immediate Anesthesia Transfer of Care Note  Patient: Amber Cross  Procedure(s) Performed: LEFT ARTERIOVENOUS (AV) FISTULA CREATION (Left: Arm Lower)  Patient Location: PACU  Anesthesia Type:MAC and Regional  Level of Consciousness: awake, alert , and oriented  Airway & Oxygen Therapy: Patient Spontanous Breathing and Patient connected to nasal cannula oxygen  Post-op Assessment: Report given to RN and Post -op Vital signs reviewed and stable  Post vital signs: Reviewed and stable  Last Vitals:  Vitals Value Taken Time  BP 157/62 07/01/22 0909  Temp    Pulse 78 07/01/22 0911  Resp 17 07/01/22 0911  SpO2 92 % 07/01/22 0911  Vitals shown include unvalidated device data.  Last Pain:  Vitals:   07/01/22 0403  TempSrc: Oral  PainSc: 0-No pain      Patients Stated Pain Goal: 2 (87/19/59 7471)  Complications: No notable events documented.

## 2022-07-01 NOTE — Progress Notes (Signed)
Nephrology Follow-Up note   Assessment/Recommendations: Amber Cross is a/an 61 y.o. female with a past medical history significant for HFrEF, HTN, DM2, small cell lung cancer, chronic hypoxic respiratory failure, and CKD IV who presents with acute on chronic hypoxic respiratory failure   # AKI on CKD 4 with progression to ESRD: Secondary to hemodynamic mediated process possibly some degree of cardiorenal syndrome. Significant CKD related to diabetes (biopsy proven) with severe interstitial fibrosis and tubular atrophy.  Baseline Cr is 3-3.3 per charting. Less likely to recover.  She has had a pericardial effusion and pleural effusion.  RIJ tunneled dialysis catheter placed on 11/29 with IR.  Started HD on 11/30 for overload including pleural and cardiac effusions  ------------------------ - Next HD on 12/5 (getting AVF on 12/4).  HD per TTS schedule for now but has been accepted at [facility] M[facility]-  will need to change prior to discharge-  tryin not to run 2 days in a row - Consulted VVS for AVF - this is planned for 12/4.  Appreciate their assistance - Consulted HD SW to initiate CLIP process - note that she is ESRD  - Foley was removed - check post-void residual bladder scan and in/out if over 250 ml retained    # Acute on chronic hypoxic respiratory failure: Continue with volume optimization as desired based on cardiac status.  Supplemental oxygen as needed.  Overall improved but overloaded  # Right pleural effusion  - s/p chest tube with IR on 11/29 - optimize volume with HD   # Pericardial effusion: Status post pericardial window on 06/18/2022.  Cytology negative for malignancy. Cardiothoracic surgery following. Started HD and optimizing volume with HD   # Hypertension:  - Increase UF with HD as tolerated - Improved on current regimen - last addition was amlodipine.  May be able to reduce or stop BP meds once more euvolemic    # HFrEF:  has had minimal urine output and minimal  response to lasix IV.  Optimize volume with HD as above   # Hypokalemia  - Replete potassium with 30 meq PO once     # Anemia - multifactorial. Iron sat 29.  - Avoiding ESA given malignancy. Transfusion as needed  # Small cell lung cancer: Radiation finished in October.  Per charting she is planning to see oncology in December for restaging.  # Metabolic bone disease - Started HD and improving.  I have reduced  renvela.  Check intact PTH - is pending   We have an outpatient HD unit-  [facility] MWF-  not sure what dispo plan is   ___________________________________________________________  Interval History/Subjective:  She had 400 mL UOP over 12/2.  Last HD treatment on 12/2 with 2.5 kg UF.  S/p AVF creation this AM-  has been accepted at [facility] OP center MWF    Medications:  Current Facility-Administered Medications  Medication Dose Route Frequency Provider Last Rate Last Admin   amLODipine (NORVASC) tablet 10 mg  10 mg Oral QPM Rhyne, Samantha J, PA-C   10 mg at 06/30/22 1702   atorvastatin (LIPITOR) tablet 80 mg  80 mg Oral Daily Rhyne, Hulen Shouts, PA-C   80 mg at 07/01/22 1009   bisacodyl (DULCOLAX) EC tablet 10 mg  10 mg Oral Daily Rhyne, Samantha J, PA-C   10 mg at 07/01/22 1009   carvedilol (COREG) tablet 12.5 mg  12.5 mg Oral BID WC Rhyne, Samantha J, PA-C   12.5 mg at 07/01/22 0559  Chlorhexidine Gluconate Cloth 2 % PADS 6 each  6 each Topical Q0600 Gabriel Earing, PA-C   6 each at 07/01/22 0600   gabapentin (NEURONTIN) capsule 100 mg  100 mg Oral QHS Rhyne, Hulen Shouts, PA-C   100 mg at 06/30/22 2147   heparin injection 5,000 Units  5,000 Units Subcutaneous Q12H Gabriel Earing, PA-C   5,000 Units at 07/01/22 1222   hydrALAZINE (APRESOLINE) tablet 50 mg  50 mg Oral Q8H Rhyne, Samantha J, PA-C   50 mg at 07/01/22 0559   insulin aspart (novoLOG) injection 0-9 Units  0-9 Units Subcutaneous TID WC & HS Rhyne, Hulen Shouts, PA-C   1 Units at 07/01/22 1221   isosorbide  mononitrate (IMDUR) 24 hr tablet 30 mg  30 mg Oral Daily Rhyne, Samantha J, PA-C   30 mg at 07/01/22 1009   labetalol (NORMODYNE) injection 10 mg  10 mg Intravenous Q4H PRN Rhyne, Samantha J, PA-C   10 mg at 06/30/22 2336   lidocaine (XYLOCAINE) 1 % (with pres) injection 10 mL  10 mL Intradermal Once Rhyne, Samantha J, PA-C       metoCLOPramide (REGLAN) injection 10 mg  10 mg Intravenous Q12H Rhyne, Samantha J, PA-C   10 mg at 07/01/22 1011   nitroGLYCERIN (NITROSTAT) SL tablet 0.4 mg  0.4 mg Sublingual Q5 min PRN Rhyne, Samantha J, PA-C       ondansetron (ZOFRAN) injection 4 mg  4 mg Intravenous Q6H PRN Rhyne, Samantha J, PA-C   4 mg at 06/29/22 0448   Oral care mouth rinse  15 mL Mouth Rinse PRN Rhyne, Samantha J, PA-C       oxyCODONE (Oxy IR/ROXICODONE) immediate release tablet 5 mg  5 mg Oral Q4H PRN Rhyne, Samantha J, PA-C   5 mg at 06/30/22 1613   pantoprazole (PROTONIX) EC tablet 40 mg  40 mg Oral Daily Rhyne, Samantha J, PA-C   40 mg at 07/01/22 1009   promethazine (PHENERGAN) 6.25 mg in sodium chloride 0.9 % 50 mL IVPB  6.25 mg Intravenous Q6H PRN Gabriel Earing, PA-C   Stopped at 06/29/22 6269   senna-docusate (Senokot-S) tablet 1 tablet  1 tablet Oral QHS Rhyne, Hulen Shouts, PA-C   1 tablet at 06/29/22 2214   sevelamer carbonate (RENVELA) tablet 800 mg  800 mg Oral TID WC Rhyne, Samantha J, PA-C   800 mg at 07/01/22 1008   traZODone (DESYREL) tablet 50 mg  50 mg Oral QHS PRN Gabriel Earing, PA-C   50 mg at 06/30/22 2330      Physical Exam: Vitals:   07/01/22 0949 07/01/22 1200  BP: (!) 156/86 133/66  Pulse: 75 76  Resp: 20 13  Temp: (!) 97.4 F (36.3 C) (!) 97.2 F (36.2 C)  SpO2: 92% 94%   Total I/O In: 550 [I.V.:350; IV Piggyback:200] Out: 80 [Blood:30; Chest Tube:50]  Intake/Output Summary (Last 24 hours) at 07/01/2022 1326 Last data filed at 07/01/2022 0944 Gross per 24 hour  Intake 790 ml  Output 280 ml  Net 510 ml    General adult female in bed in no acute  distress   HEENT normocephalic atraumatic extraocular movements intact sclera anicteric Neck supple trachea midline Lungs crackles at left base and right pleural rub; normal work of breathing at rest; room air.  Chest tube in  Heart S1S2  Abdomen soft nontender nondistended Extremities 1+ pitting edema bilateral lower extremities Psych normal mood and affect Neuro oriented to person, location, and time  and is conversant. Access RIJ tunn catheter in place   Test Results I personally reviewed new and old clinical labs and radiology tests Lab Results  Component Value Date   NA 132 (L) 07/01/2022   K 3.4 (L) 07/01/2022   CL 98 07/01/2022   CO2 25 07/01/2022   BUN 23 (H) 07/01/2022   CREATININE 3.48 (H) 07/01/2022   CALCIUM 7.7 (L) 07/01/2022   ALBUMIN 2.1 (L) 07/01/2022   PHOS 3.9 07/01/2022    CBC Recent Labs  Lab 06/29/22 0948 06/30/22 0013 07/01/22 0026  WBC 8.6 9.8 10.3  NEUTROABS  --  8.1*  --   HGB 8.5* 9.3* 8.7*  HCT 27.9* 28.2* 27.6*  MCV 90.0 86.5 87.3  PLT 423* 434* 451*    Louis Meckel, MD 1:26 PM 07/01/2022

## 2022-07-01 NOTE — Progress Notes (Signed)
Mobility Specialist Progress Note    07/01/22 1404  Mobility  Activity Ambulated with assistance in hallway  Level of Assistance Contact guard assist, steadying assist  Assistive Device Other (Comment) (HHA)  Distance Ambulated (ft) 150 ft  Activity Response Tolerated well  Mobility Referral Yes  $Mobility charge 1 Mobility   Pre-Mobility: 73 HR, 90% SpO2 Post-Mobility: 81 HR, 94% SpO2  Pt received in bed and agreeable. No complaints on walk. Stated she feels "tore up from the floor up!" from her medicine. Returned to bed with call bell in reach.    Hildred Alamin Mobility Specialist  Please Psychologist, sport and exercise or Rehab Office at [phone]

## 2022-07-01 NOTE — Anesthesia Procedure Notes (Signed)
Procedure Name: MAC Date/Time: 07/01/2022 7:35 AM  Performed by: Harden Mo, CRNAPre-anesthesia Checklist: Patient identified, Emergency Drugs available, Suction available and Patient being monitored Patient Re-evaluated:Patient Re-evaluated prior to induction Oxygen Delivery Method: Simple face mask Preoxygenation: Pre-oxygenation with 100% oxygen Induction Type: IV induction Placement Confirmation: positive ETCO2 and breath sounds checked- equal and bilateral Dental Injury: Teeth and Oropharynx as per pre-operative assessment

## 2022-07-02 ENCOUNTER — Other Ambulatory Visit: Payer: Self-pay | Admitting: Urology

## 2022-07-02 ENCOUNTER — Encounter (HOSPITAL_COMMUNITY): Payer: Self-pay | Admitting: Vascular Surgery

## 2022-07-02 ENCOUNTER — Other Ambulatory Visit (HOSPITAL_COMMUNITY): Payer: Self-pay

## 2022-07-02 ENCOUNTER — Inpatient Hospital Stay (HOSPITAL_COMMUNITY): Payer: Commercial Managed Care - HMO

## 2022-07-02 DIAGNOSIS — E440 Moderate protein-calorie malnutrition: Secondary | ICD-10-CM | POA: Insufficient documentation

## 2022-07-02 DIAGNOSIS — C3411 Malignant neoplasm of upper lobe, right bronchus or lung: Secondary | ICD-10-CM

## 2022-07-02 DIAGNOSIS — I314 Cardiac tamponade: Secondary | ICD-10-CM | POA: Diagnosis not present

## 2022-07-02 LAB — RENAL FUNCTION PANEL
Albumin: 2.3 g/dL — ABNORMAL LOW (ref 3.5–5.0)
Anion gap: 9 (ref 5–15)
BUN: 29 mg/dL — ABNORMAL HIGH (ref 6–20)
CO2: 25 mmol/L (ref 22–32)
Calcium: 8 mg/dL — ABNORMAL LOW (ref 8.9–10.3)
Chloride: 102 mmol/L (ref 98–111)
Creatinine, Ser: 4.29 mg/dL — ABNORMAL HIGH (ref 0.44–1.00)
GFR, Estimated: 11 mL/min — ABNORMAL LOW (ref >60)
Glucose, Bld: 139 mg/dL — ABNORMAL HIGH (ref 70–99)
Phosphorus: 4.1 mg/dL (ref 2.5–4.6)
Potassium: 3.6 mmol/L (ref 3.5–5.1)
Sodium: 136 mmol/L (ref 135–145)

## 2022-07-02 LAB — CBC WITH DIFFERENTIAL/PLATELET
Abs Immature Granulocytes: 0.05 10*3/uL (ref 0.00–0.07)
Basophils Absolute: 0.1 10*3/uL (ref 0.0–0.1)
Basophils Relative: 0 %
Eosinophils Absolute: 0.2 10*3/uL (ref 0.0–0.5)
Eosinophils Relative: 2 %
HCT: 29 % — ABNORMAL LOW (ref 36.0–46.0)
Hemoglobin: 8.9 g/dL — ABNORMAL LOW (ref 12.0–15.0)
Immature Granulocytes: 0 %
Lymphocytes Relative: 8 %
Lymphs Abs: 1 10*3/uL (ref 0.7–4.0)
MCH: 27.4 pg (ref 26.0–34.0)
MCHC: 30.7 g/dL (ref 30.0–36.0)
MCV: 89.2 fL (ref 80.0–100.0)
Monocytes Absolute: 0.8 10*3/uL (ref 0.1–1.0)
Monocytes Relative: 7 %
Neutro Abs: 10.2 10*3/uL — ABNORMAL HIGH (ref 1.7–7.7)
Neutrophils Relative %: 83 %
Platelets: 488 10*3/uL — ABNORMAL HIGH (ref 150–400)
RBC: 3.25 MIL/uL — ABNORMAL LOW (ref 3.87–5.11)
RDW: 15.4 % (ref 11.5–15.5)
WBC: 12.2 10*3/uL — ABNORMAL HIGH (ref 4.0–10.5)
nRBC: 0 % (ref 0.0–0.2)

## 2022-07-02 LAB — PARATHYROID HORMONE, INTACT (NO CA): PTH: 38 pg/mL (ref 15–65)

## 2022-07-02 LAB — GLUCOSE, CAPILLARY
Glucose-Capillary: 130 mg/dL — ABNORMAL HIGH (ref 70–99)
Glucose-Capillary: 111 mg/dL — ABNORMAL HIGH (ref 70–99)
Glucose-Capillary: 160 mg/dL — ABNORMAL HIGH (ref 70–99)

## 2022-07-02 MED ORDER — NEPRO/CARBSTEADY PO LIQD: 237.0000 mL | Freq: Two times a day (BID) | ORAL | Status: DC

## 2022-07-02 MED ORDER — ATORVASTATIN CALCIUM 80 MG PO TABS
80.0000 mg | ORAL_TABLET | Freq: Every day | ORAL | 0 refills | Status: DC
  Filled 2022-07-02: qty 30, 30d supply, fill #0

## 2022-07-02 MED ORDER — CARVEDILOL 12.5 MG PO TABS
12.5000 mg | ORAL_TABLET | Freq: Two times a day (BID) | ORAL | 1 refills | Status: DC
  Filled 2022-07-02: qty 60, 30d supply, fill #0

## 2022-07-02 MED ORDER — TALC (STERITALC) POWDER FOR INTRAPLEURAL USE
4.0000 g | PREFILLED_SYRINGE | Freq: Once | INTRAPLEURAL | Status: DC
  Filled 2022-07-02: qty 4

## 2022-07-02 MED ORDER — OXYCODONE HCL 5 MG PO TABS: 5.0000 mg | ORAL_TABLET | ORAL | 0 refills | Status: AC | PRN

## 2022-07-02 MED ORDER — RENA-VITE PO TABS: 1.0000 | ORAL_TABLET | Freq: Every day | ORAL | Status: DC

## 2022-07-02 MED ORDER — TRIMETHOBENZAMIDE HCL 300 MG PO CAPS: 300.0000 mg | ORAL_CAPSULE | Freq: Three times a day (TID) | ORAL | 0 refills | Status: AC | PRN

## 2022-07-02 NOTE — Progress Notes (Addendum)
  Progress Note  1 Day Post-Op  Subjective:  pain overnight at surgery site but better this morning  Vitals:   07/02/22 0507 07/02/22 0642  BP: (!) 145/61   Pulse: 78 93  Resp: 17 17  Temp: 98.3 F (36.8 C)   SpO2: (!) 87% 92%   Physical Exam: Lungs:  non labored Incisions:  incisions c/d/i Extremities:  palpable L radial and palpable thrill at anastomosis Neurologic: A&O  CBC    Component Value Date/Time   WBC 12.2 (H) 07/02/2022 0033   RBC 3.25 (L) 07/02/2022 0033   HGB 8.9 (L) 07/02/2022 0033   HGB 6.9 (LL) 12/11/2021 1626   HCT 29.0 (L) 07/02/2022 0033   HCT 21.0 (L) 12/11/2021 1626   PLT 488 (H) 07/02/2022 0033   PLT 307 12/11/2021 1626   MCV 89.2 07/02/2022 0033   MCV 87 12/11/2021 1626   MCH 27.4 07/02/2022 0033   MCHC 30.7 07/02/2022 0033   RDW 15.4 07/02/2022 0033   RDW 13.9 12/11/2021 1626   LYMPHSABS 1.0 07/02/2022 0033   MONOABS 0.8 07/02/2022 0033   EOSABS 0.2 07/02/2022 0033   BASOSABS 0.1 07/02/2022 0033    BMET    Component Value Date/Time   NA 136 07/02/2022 0033   NA 138 02/20/2022 1207   K 3.6 07/02/2022 0033   CL 102 07/02/2022 0033   CO2 25 07/02/2022 0033   GLUCOSE 139 (H) 07/02/2022 0033   BUN 29 (H) 07/02/2022 0033   BUN 25 02/20/2022 1207   CREATININE 4.29 (H) 07/02/2022 0033   CALCIUM 8.0 (L) 07/02/2022 0033   GFRNONAA 11 (L) 07/02/2022 0033   GFRAA 120 04/06/2019 1506    INR    Component Value Date/Time   INR 1.1 06/17/2022 1948     Intake/Output Summary (Last 24 hours) at 07/02/2022 0755 Last data filed at 07/02/2022 0507 Gross per 24 hour  Intake 350 ml  Output 100 ml  Net 250 ml   Assessment/Plan:  61 y.o. female is s/p L BC AVF 1 Day Post-Op   Patent BC AVF without sign or symptoms of steal syndrome L hand Incisions are well appearing Vascular will sign off; office will arrange duplex in 5-6 weeks   Dagoberto Ligas, PA-C [facility] [phone] 07/02/2022 7:55 AM  I have interviewed  the patient and examined the patient. I agree with the findings by the PA.  Gae Gallop, MD

## 2022-07-02 NOTE — Progress Notes (Signed)
Nephrology Follow-Up note   Assessment/Recommendations: Amber Cross is a/an 61 y.o. female with  HFrEF, HTN, DM2, small cell lung cancer, chronic hypoxic respiratory failure, and CKD IV who presents with acute on chronic hypoxic respiratory failure   # AKI on CKD 4 with progression to ESRD: Secondary to hemodynamic mediated process possibly some degree of cardiorenal syndrome. Significant CKD related to diabetes (biopsy proven) with severe interstitial fibrosis and tubular atrophy.  Baseline Cr is 3-3.3 per charting. Less likely to recover.  She has had a pericardial effusion and pleural effusion.  RIJ tunneled dialysis catheter placed on 11/29 with IR.  Started HD on 11/30 for overload including pleural and cardiac effusions  ------------------------ - Next HD on 12/5 .  HD per TTS schedule for now but has been accepted at [facility] M[facility]-  will need to change prior to discharge-  trying not to run 2 days in a row-  thinking after today can wait til Friday as OP for next HD - s/p AVF  12/4.  Appreciate [facility]VS assistance    # Acute on chronic hypoxic respiratory failure: Continue with volume optimization as desired based on cardiac status.  Supplemental oxygen as needed.  Overall improved but overloaded  # Right pleural effusion  - s/p chest tube with IR on 11/29 - optimize volume with HD   # Pericardial effusion: Status post pericardial window on 06/18/2022.  Cytology negative for malignancy. Cardiothoracic surgery following. Started HD and optimizing volume with HD   # Hypertension:  - Increase UF with HD as tolerated - Improved on current regimen - last addition was amlodipine.  May be able to reduce or stop BP meds once more euvolemic    # Hypokalemia  - Replete potassium and high K bath     # Anemia - multifactorial. Iron sat 29.  - Avoiding ESA given malignancy. Transfusion as needed  # Small cell lung cancer: Radiation finished in October.  Per charting she is planning to see  oncology in December for restaging.  # Metabolic bone disease - Started HD and improving.  I have reduced  renvela.  Check intact PTH - is pending   ___________________________________________________________  Interval History/Subjective:  Up walking-  due for HD today -  notes indicate that CT will be removed today as well     Medications:  Current Facility-Administered Medications  Medication Dose Route Frequency Provider Last Rate Last Admin   amLODipine (NORVASC) tablet 10 mg  10 mg Oral QPM Rhyne, Samantha J, PA-C   10 mg at 07/01/22 1753   atorvastatin (LIPITOR) tablet 80 mg  80 mg Oral Daily Rhyne, Hulen Shouts, PA-C   80 mg at 07/01/22 1009   bisacodyl (DULCOLAX) EC tablet 10 mg  10 mg Oral Daily Rhyne, Samantha J, PA-C   10 mg at 07/01/22 1009   carvedilol (COREG) tablet 12.5 mg  12.5 mg Oral BID WC Rhyne, Samantha J, PA-C   12.5 mg at 07/02/22 8413   Chlorhexidine Gluconate Cloth 2 % PADS 6 each  6 each Topical Q0600 Rhyne, Samantha J, PA-C   6 each at 07/01/22 0600   Chlorhexidine Gluconate Cloth 2 % PADS 6 each  6 each Topical Q0600 Corliss Parish, MD   6 each at 07/02/22 0626   gabapentin (NEURONTIN) capsule 100 mg  100 mg Oral QHS Rhyne, Samantha J, PA-C   100 mg at 07/01/22 2240   heparin injection 5,000 Units  5,000 Units Subcutaneous Q12H Rhyne, Hulen Shouts, PA-C  5,000 Units at 07/01/22 2241   hydrALAZINE (APRESOLINE) tablet 50 mg  50 mg Oral Q8H Rhyne, Samantha J, PA-C   50 mg at 07/02/22 0998   HYDROmorphone (DILAUDID) tablet 2 mg  2 mg Oral Q6H PRN Iona Coach, MD   2 mg at 07/01/22 2349   insulin aspart (novoLOG) injection 0-9 Units  0-9 Units Subcutaneous TID WC & HS Gabriel Earing, PA-C   1 Units at 07/02/22 3382   isosorbide mononitrate (IMDUR) 24 hr tablet 30 mg  30 mg Oral Daily Rhyne, Samantha J, PA-C   30 mg at 07/01/22 1009   labetalol (NORMODYNE) injection 10 mg  10 mg Intravenous Q4H PRN Rhyne, Hulen Shouts, PA-C   10 mg at 06/30/22 2336   lidocaine  (XYLOCAINE) 1 % (with pres) injection 10 mL  10 mL Intradermal Once Rhyne, Samantha J, PA-C       metoCLOPramide (REGLAN) injection 10 mg  10 mg Intravenous Q12H Rhyne, Samantha J, PA-C   10 mg at 07/01/22 2241   nitroGLYCERIN (NITROSTAT) SL tablet 0.4 mg  0.4 mg Sublingual Q5 min PRN Rhyne, Samantha J, PA-C       ondansetron (ZOFRAN) injection 4 mg  4 mg Intravenous Q6H PRN Rhyne, Samantha J, PA-C   4 mg at 06/29/22 0448   Oral care mouth rinse  15 mL Mouth Rinse PRN Rhyne, Samantha J, PA-C       oxyCODONE (Oxy IR/ROXICODONE) immediate release tablet 5 mg  5 mg Oral Q4H PRN Rhyne, Samantha J, PA-C   5 mg at 07/02/22 0912   pantoprazole (PROTONIX) EC tablet 40 mg  40 mg Oral Daily Rhyne, Samantha J, PA-C   40 mg at 07/01/22 1009   promethazine (PHENERGAN) 6.25 mg in sodium chloride 0.9 % 50 mL IVPB  6.25 mg Intravenous Q6H PRN Gabriel Earing, PA-C   Stopped at 06/29/22 5053   senna-docusate (Senokot-S) tablet 1 tablet  1 tablet Oral QHS Rhyne, Hulen Shouts, PA-C   1 tablet at 06/29/22 2214   sevelamer carbonate (RENVELA) tablet 800 mg  800 mg Oral TID WC Rhyne, Samantha J, PA-C   800 mg at 07/01/22 1629   talc (STERITALC) 4 g in sodium chloride (PF) 0.9 % syringe  4 g Intrapleural Once Gold, Wayne E, PA-C       traZODone (DESYREL) tablet 50 mg  50 mg Oral QHS PRN Gabriel Earing, PA-C   50 mg at 07/01/22 2240      Physical Exam: Vitals:   07/02/22 0642 07/02/22 0825  BP:  (!) 157/62  Pulse: 93 77  Resp: 17 17  Temp:  98.4 F (36.9 C)  SpO2: 92% 95%   Total I/O In: 120 [P.O.:120] Out: -   Intake/Output Summary (Last 24 hours) at 07/02/2022 9767 Last data filed at 07/02/2022 0800 Gross per 24 hour  Intake 120 ml  Output 70 ml  Net 50 ml    General adult female in bed in no acute distress   HEENT normocephalic atraumatic extraocular movements intact sclera anicteric Neck supple trachea midline Lungs crackles at left base and right pleural rub; normal work of breathing at rest;  room air.  Chest tube in  Heart S1S2  Abdomen soft nontender nondistended Extremities 1+ pitting edema bilateral lower extremities Psych normal mood and affect Neuro oriented to person, location, and time and is conversant. Access RIJ tunn catheter in place   Test Results I personally reviewed new and old clinical labs and radiology tests  Lab Results  Component Value Date   NA 136 07/02/2022   K 3.6 07/02/2022   CL 102 07/02/2022   CO2 25 07/02/2022   BUN 29 (H) 07/02/2022   CREATININE 4.29 (H) 07/02/2022   CALCIUM 8.0 (L) 07/02/2022   ALBUMIN 2.3 (L) 07/02/2022   PHOS 4.1 07/02/2022    CBC Recent Labs  Lab 06/30/22 0013 07/01/22 0026 07/02/22 0033  WBC 9.8 10.3 12.2*  NEUTROABS 8.1*  --  10.2*  HGB 9.3* 8.7* 8.9*  HCT 28.2* 27.6* 29.0*  MCV 86.5 87.3 89.2  PLT 434* 451* 488*    Louis Meckel, MD 9:25 AM 07/02/2022

## 2022-07-02 NOTE — Progress Notes (Signed)
Occupational Therapy Treatment Patient Details Name: Amber Cross MRN: [id number] DOB: 01-14-1961 Today's Date: 07/02/2022   History of present illness Patient is a 61 yo female presenting to the ED with SOB on 06/16/22. Found to have acute on chronic hypoxic respiratory failure secondary to moderate-large pericardial effusion with cardiac tamponade.  Patient underwent subxiphoid pericardiocentesis on 11/21 which collected about 5 cc of clear fluid.  Cytology of the pleural and pericardial fluid were negative for malignancy. R chest tube placed and since removed on 11/23. R chest tube placement and tunneled dialysis catheter 11/29. PMH inlcudes DM, HTN, HLD, CVA, tob use, CHF, CKD III, nephrotic syndrome, depression, RUL small cell lung Ca s/p XRT July 2023, COPD   OT comments  Pt is making steady progress, she was hesitant to participate but agreeable with gentle encouragement. Overall pt was mod I for bed mobility and superivsion A for transfers and >household distance mobility with rollator. Pt declined ADLs, but reported that she has been set up - mod I with grooming, toileting and LB dressing. OT to continue to follow acutely. POC remains appropriate.    Recommendations for follow up therapy are one component of a multi-disciplinary discharge planning process, led by the attending physician.  Recommendations may be updated based on patient status, additional functional criteria and insurance authorization.    Follow Up Recommendations  No OT follow up     Assistance Recommended at Discharge Intermittent Supervision/Assistance  Patient can return home with the following  A little help with walking and/or transfers;A lot of help with bathing/dressing/bathroom;Assist for transportation;Help with stairs or ramp for entrance   Equipment Recommendations  Other (comment) (rollator)       Precautions / Restrictions Precautions Precautions: Fall Restrictions Weight Bearing Restrictions: No        Mobility Bed Mobility Overal bed mobility: Needs Assistance Bed Mobility: Supine to Sit     Supine to sit: Modified independent (Device/Increase time)          Transfers Overall transfer level: Needs assistance Equipment used: Rollator (4 wheels) Transfers: Sit to/from Stand Sit to Stand: Supervision                 Balance Overall balance assessment: Needs assistance Sitting-balance support: Feet supported Sitting balance-Leahy Scale: Good     Standing balance support: Single extremity supported, During functional activity Standing balance-Leahy Scale: Fair                             ADL either performed or assessed with clinical judgement   ADL Overall ADL's : Needs assistance/impaired                         Toilet Transfer: Supervision/safety;Ambulation;Regular Glass blower/designer Details (indicate cue type and reason): simulated         Functional mobility during ADLs: Supervision/safety;Rollator (4 wheels) General ADL Comments: pt declined ADLs this date, agreeable to mobilize with rollator in hallway. Overall required supervision A throughout    Extremity/Trunk Assessment Upper Extremity Assessment Upper Extremity Assessment: Generalized weakness   Lower Extremity Assessment Lower Extremity Assessment: Generalized weakness        Vision   Vision Assessment?: No apparent visual deficits   Perception Perception Perception: Not tested   Praxis Praxis Praxis: Not tested    Cognition Arousal/Alertness: Awake/alert Behavior During Therapy: WFL for tasks assessed/performed Overall Cognitive Status: No family/caregiver present to determine baseline cognitive functioning  General Comments: Pt initially agitated with therapist, eventually agreeable  to particiapte with gentle encourgement        Exercises      Shoulder Instructions       General Comments  VSS on RA    Pertinent Vitals/ Pain       Pain Assessment Pain Assessment: Faces Faces Pain Scale: Hurts a little bit Pain Location: generalized with mobility Pain Descriptors / Indicators: Aching, Discomfort Pain Intervention(s): Limited activity within patient's tolerance, Monitored during session  Home Living                                          Prior Functioning/Environment              Frequency  Min 2X/week        Progress Toward Goals  OT Goals(current goals can now be found in the care plan section)  Progress towards OT goals: Progressing toward goals  Acute Rehab OT Goals Patient Stated Goal: to go home OT Goal Formulation: With patient Time For Goal Achievement: 07/10/22 Potential to Achieve Goals: Good ADL Goals Pt Will Perform Lower Body Bathing: with min guard assist;sit to/from stand;sitting/lateral leans Pt Will Perform Lower Body Dressing: with min guard assist;sitting/lateral leans;sit to/from stand Pt Will Transfer to Toilet: with supervision;ambulating;regular height toilet Pt Will Perform Toileting - Clothing Manipulation and hygiene: with supervision;sitting/lateral leans;sit to/from stand Additional ADL Goal #1: Patient will be able to verbalize 3 strategies for energy conservation to promote activity tolerance at home. Additional ADL Goal #2: Patient will be able to complete functional task in standing for 3-5 minutes prior to needing seated rest break to increase activity tolerance.  Plan Discharge plan remains appropriate    Co-evaluation                 AM-PAC OT "6 Clicks" Daily Activity     Outcome Measure   Help from another person eating meals?: None Help from another person taking care of personal grooming?: A Little Help from another person toileting, which includes using toliet, bedpan, or urinal?: A Little Help from another person bathing (including washing, rinsing, drying)?: A Little Help from  another person to put on and taking off regular upper body clothing?: None Help from another person to put on and taking off regular lower body clothing?: A Little 6 Click Score: 20    End of Session Equipment Utilized During Treatment: Rollator (4 wheels)  OT Visit Diagnosis: Unsteadiness on feet (R26.81);Muscle weakness (generalized) (M62.81)   Activity Tolerance Patient tolerated treatment well   Patient Left in bed;with call bell/phone within reach;Other (comment)   Nurse Communication Mobility status        Time: 6378-5885 OT Time Calculation (min): 14 min  Charges: OT General Charges $OT Visit: 1 Visit OT Treatments $Therapeutic Activity: 8-22 mins    Elliot Cousin 07/02/2022, 4:43 PM

## 2022-07-02 NOTE — TOC Progression Note (Signed)
Transition of Care [facility]) - Progression Note    Patient Details  Name: Amber Cross MRN: [id number] Date of Birth: 1961-01-17  Transition of Care [facility]) CM/SW [place], [place] Phone Number: 07/02/2022, 2:30 PM  Clinical Narrative:    CSW met with pt and provided info for Access transport in [facility]. CSW answered all questions regarding available transport options and Medicaid. TOC is available for further needs.   Expected Discharge Plan: [place] Barriers to Discharge: Continued Medical Work up  Expected Discharge Plan and Services Expected Discharge Plan: [place] In-house Referral: NA Discharge Planning Services: CM Consult   Living arrangements for the past 2 months: Single Family Home                                       Social Determinants of Health (SDOH) Interventions    Readmission Risk Interventions     No data to display

## 2022-07-02 NOTE — Anesthesia Postprocedure Evaluation (Signed)
Anesthesia Post Note  Patient: Amber Cross  Procedure(s) Performed: LEFT ARTERIOVENOUS (AV) FISTULA CREATION (Left: Arm Lower)     Patient location during evaluation: PACU Anesthesia Type: Regional and MAC Level of consciousness: awake and alert Pain management: pain level controlled Vital Signs Assessment: post-procedure vital signs reviewed and stable Respiratory status: spontaneous breathing, nonlabored ventilation, respiratory function stable and patient connected to nasal cannula oxygen Cardiovascular status: stable and blood pressure returned to baseline Postop Assessment: no apparent nausea or vomiting Anesthetic complications: no   No notable events documented.  Last Vitals:  Vitals:   07/02/22 0642 07/02/22 0825  BP:  (!) 157/62  Pulse: 93 77  Resp: 17 17  Temp:  36.9 C  SpO2: 92% 95%    Last Pain:  Vitals:   07/02/22 0957  TempSrc:   PainSc: [place]

## 2022-07-02 NOTE — Progress Notes (Addendum)
Initial Nutrition Assessment  DOCUMENTATION CODES:   Non-severe (moderate) malnutrition in context of chronic illness  INTERVENTION:  - Add Nepro Shake po BID, each supplement provides 425 kcal and 19 grams protein.  - Add Renal MVI q day.   NUTRITION DIAGNOSIS:   Moderate Malnutrition related to poor appetite as evidenced by energy intake < or equal to 75% for > or equal to 1 month, mild muscle depletion, mild fat depletion.  GOAL:   Patient will meet greater than or equal to 90% of their needs  MONITOR:   PO intake, Supplement acceptance  REASON FOR ASSESSMENT:   Consult Diet education  ASSESSMENT:   61 y.o. female admits related to surgery (left AV fistula creation). PMH includes: T2DM, HTN, HFrEF, neuropathy, gout, HLD, lung cancer. Pt is currently receiving medical management for cardiac tamponade s/p pericardial window.  Meds include: dulcolax, sliding scale insulin, reglan, senokot. Labs reviewed.   The pt reports that she does not have much of an appetite and has not been eating well since admission. She states that she had a poor appetite for >1 month PTA and lost a significant amount of weight. Wts are stable per record, but pt did have some visual wasting. Pt agreed to try protein shakes to help with caloric intakes. RD will continue to monitor PO intakes.   RD provided pt with diet handout.   NUTRITION - FOCUSED PHYSICAL EXAM:  Flowsheet Row Most Recent Value  Orbital Region Mild depletion  Upper Arm Region No depletion  Thoracic and Lumbar Region Unable to assess  Buccal Region Mild depletion  Temple Region Mild depletion  Clavicle Bone Region Mild depletion  Clavicle and Acromion Bone Region Mild depletion  Scapular Bone Region Unable to assess  Dorsal Hand Unable to assess  Patellar Region Unable to assess  Anterior Thigh Region Unable to assess  Posterior Calf Region Mild depletion  Edema (RD Assessment) None  Hair Reviewed  Eyes Reviewed  Mouth  Reviewed  Skin Reviewed  Nails Reviewed       Diet Order:   Diet Order             Diet Heart Room service appropriate? Yes; Fluid consistency: Thin  Diet effective now                   EDUCATION NEEDS:   Not appropriate for education at this time  Skin:  Skin Assessment: Skin Integrity Issues: Skin Integrity Issues:: Incisions Incisions: abdomen; right chest, left arm.  Last BM:  06/30/22  Height:   Ht Readings from Last 1 Encounters:  06/16/22 5\' 7"  (1.702 m)    Weight:   Wt Readings from Last 1 Encounters:  07/02/22 63.3 kg    Ideal Body Weight:     BMI:  Body mass index is 21.86 kg/m.  Estimated Nutritional Needs:   Kcal:  4196-2229 kcals  Protein:  80-95 gm  Fluid:  >/= 1.5 L  Thalia Bloodgood, RD, LDN, CNSC.

## 2022-07-02 NOTE — Progress Notes (Signed)
Mobility Specialist Progress Note    07/02/22 0928  Mobility  Activity Ambulated with assistance in hallway  Level of Assistance Standby assist, set-up cues, supervision of patient - no hands on  Assistive Device Four wheel walker  Distance Ambulated (ft) 420 ft  Activity Response Tolerated well  Mobility Referral Yes  $Mobility charge 1 Mobility   Pre-Mobility: 85 HR, 95% SpO2 During Mobility: 101 HR Post-Mobility: 92 HR, 90% SpO2  Pt received in bed and agreeable. No complaints on walk. Returned to bed with call bell in reach.    Hildred Alamin Mobility Specialist  Please Psychologist, sport and exercise or Rehab Office at [phone]

## 2022-07-02 NOTE — Discharge Planning (Signed)
[facility] RENAL DISCHARGE ORDERS:  Starting outpatient HD at [facility] on MWF schedule - 6:50am.  FREQUENCY: 3X/week TIME: 4:00 hours DIALYZER: 180 BFR: 400 DFR: A1.5 BATH: 3K/2.25Ca No UF or Na profile EDW: 62kg - continue to challenge   Last Weight  Most recent update: 07/02/2022  6:43 AM    Weight  63.3 kg (139 lb 8.8 oz)             MEDICATION ORDERS: Mircera 36mcg IV q 2 weeks - start this week Venofer: Per protocol, tsat 29% on 06/19/22 Hectoral: Per protocol. PTH 38 (low) on 07/01/22 ONSP: YES - per clinic protocol, last albumin 2.3  ALLERGIES: Penicillin Strawberry Tomato  LAB ORDERS: Monthly labs on admit, K level weekly while on 3K bath  ACCESS: TDC placed 11/29 by [facility] IR LEFT BC AVF placed 07/01/22 by Dr. Scot Dock.  Past Medical History:  Diagnosis Date   Anemia    Arthritis    CHF (congestive heart failure) ([place])    Chronic kidney disease    Depression    Diabetes mellitus    Headache    Hypercholesteremia    Hypertension    Pseudogout    Stroke ([place]) 10/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   Tobacco abuse     Veneta Penton, PA-C [employer] Pager [phone]

## 2022-07-02 NOTE — Progress Notes (Addendum)
[address]       [place] [address]             [phone]     1 Day Post-Op Procedure(s) (LRB): LEFT ARTERIOVENOUS (AV) FISTULA CREATION (Left) Subjective: She conts to look and feel better  Objective: Vital signs in last 24 hours: Temp:  [97.2 F (36.2 C)-98.3 F (36.8 C)] 98.3 F (36.8 C) (12/05 0507) Pulse Rate:  [75-93] 93 (12/05 0642) Cardiac Rhythm: Normal sinus rhythm (12/04 1900) Resp:  [13-20] 17 (12/05 0642) BP: (133-175)/(58-86) 145/61 (12/05 0507) SpO2:  [87 %-94 %] 92 % (12/05 0642) Weight:  [63.3 kg] 63.3 kg (12/05 0642)  Hemodynamic parameters for last 24 hours:    Intake/Output from previous day: 12/04 0701 - 12/05 0700 In: 550 [I.V.:350; IV Piggyback:200] Out: 100 [Blood:30; Chest Tube:70] Intake/Output this shift: No intake/output data recorded.  General appearance: alert, cooperative, and no distress Heart: regular rate and rhythm Lungs: fair air exchange throughout  Lab Results: Recent Labs    07/01/22 0026 07/02/22 0033  WBC 10.3 12.2*  HGB 8.7* 8.9*  HCT 27.6* 29.0*  PLT 451* 488*   BMET:  Recent Labs    07/01/22 0026 07/02/22 0033  NA 132* 136  K 3.4* 3.6  CL 98 102  CO2 25 25  GLUCOSE 150* 139*  BUN 23* 29*  CREATININE 3.48* 4.29*  CALCIUM 7.7* 8.0*    PT/INR: No results for input(s): "LABPROT", "INR" in the last 72 hours. ABG    Component Value Date/Time   PHART 7.240 (L) 06/19/2022 0408   HCO3 19.2 (L) 06/19/2022 0408   TCO2 21 (L) 06/19/2022 0408   ACIDBASEDEF 8.0 (H) 06/19/2022 0408   O2SAT 91 06/19/2022 0408   CBG (last 3)  Recent Labs    07/01/22 1600 07/01/22 2137 07/02/22 0636  GLUCAP 123* 187* 130*    Meds Scheduled Meds:  amLODipine  10 mg Oral QPM   atorvastatin  80 mg Oral Daily   bisacodyl  10 mg Oral Daily   carvedilol  12.5 mg Oral BID WC   Chlorhexidine Gluconate Cloth  6 each Topical Q0600   Chlorhexidine Gluconate Cloth  6 each Topical Q0600   gabapentin  100  mg Oral QHS   heparin injection (subcutaneous)  5,000 Units Subcutaneous Q12H   hydrALAZINE  50 mg Oral Q8H   insulin aspart  0-9 Units Subcutaneous TID WC & HS   isosorbide mononitrate  30 mg Oral Daily   lidocaine  10 mL Intradermal Once   metoCLOPramide (REGLAN) injection  10 mg Intravenous Q12H   pantoprazole  40 mg Oral Daily   senna-docusate  1 tablet Oral QHS   sevelamer carbonate  800 mg Oral TID WC   Continuous Infusions:  promethazine (PHENERGAN) injection (IM or IVPB) Stopped (06/29/22 4944)   PRN Meds:.HYDROmorphone, labetalol, nitroGLYCERIN, ondansetron (ZOFRAN) IV, mouth rinse, oxyCODONE, promethazine (PHENERGAN) injection (IM or IVPB), traZODone  Xrays No results found.  Assessment/Plan: S/P Procedure(s) (LRB): LEFT ARTERIOVENOUS (AV) FISTULA CREATION (Left)  1 afeb, s BP 130's-170's, sinus rhythm 2 sats mostly ok on RA, does dip to 80's  3 chest tube 70 cc- will plan to talc today and have tube removed   LOS: 16 days    John Giovanni PA-C Pager [phone] 07/02/2022  Will administer talc pleurodesis now drainage scant and she has had recurrent  inflammatory effusion  requiring chest tubes. OK for DC from surgical aspect- will follow up  in office  patient examined and medical record reviewed,agree with above note. Amber Cross 07/02/2022

## 2022-07-02 NOTE — Progress Notes (Signed)
   Rt chest tube placed in IR 06/26/22 Recurrent Rt pleural effusion  TCTS has followed IR chest tube since 11/29  Talc installed in pigtail tube today Chest tube removal request per TCTS  Discussed with Dr Anselm Pancoast--- he approves procedure  Chest tube removal at bedside without complication Vaseline gauze bandage placed  Post removal CXR: pending

## 2022-07-02 NOTE — Progress Notes (Signed)
      [address]       ,[place] [address]             [phone]     4 gm talc instilled in pigtail chest tube. Patient tolerated well. Will ask IR to remove tube.  John Giovanni, PA-C

## 2022-07-02 NOTE — Progress Notes (Signed)
Called by primary nurse approximately 5pm to let us know pt was being d/c home and would not be getting HD tx in our unit today/tonight. I called her back approximately [phone] to verify. Pt is to be d/c home and HD to be done at her clinic

## 2022-07-02 NOTE — Discharge Summary (Addendum)
Name: Amber Cross MRN: [id number] DOB: 1961-07-08 61 y.o. PCP: Starlyn Skeans, MD  Date of Admission: 06/16/2022  9:51 AM Date of Discharge: 07/02/2022 Attending Physician: Dr. Charise Killian   Discharge Diagnosis: Principal Problem:   Tamponade Active Problems:   Type 2 diabetes mellitus with diabetic polyneuropathy (HCC)   Acute on chronic systolic congestive heart failure (HCC)   Small cell lung cancer, right upper lobe ([place])   AKI (acute kidney injury) ([place])   Hypertensive emergency   Malnutrition of moderate degree    Discharge Medications: Allergies as of 07/02/2022       Reactions   Penicillins Itching, Other (See Comments)   redness   Strawberry (diagnostic) Itching, Swelling   Tomato Itching, Swelling        Medication List     STOP taking these medications    rosuvastatin 40 MG tablet Commonly known as: CRESTOR   torsemide 20 MG tablet Commonly known as: DEMADEX       TAKE these medications    acetaminophen 500 MG tablet Commonly known as: TYLENOL Take 1,000 mg by mouth every 6 (six) hours as needed for mild pain.   albuterol 108 (90 Base) MCG/ACT inhaler Commonly known as: VENTOLIN HFA Inhale 2 puffs into the lungs every 6 (six) hours as needed for wheezing or shortness of breath.   amLODipine 10 MG tablet Commonly known as: NORVASC Take 1 tablet (10 mg total) by mouth daily.   aspirin EC 81 MG tablet Take 1 tablet (81 mg total) by mouth daily. Swallow whole.   atorvastatin 80 MG tablet Commonly known as: LIPITOR Take 1 tablet (80 mg total) by mouth daily. Start taking on: July 03, 2022   carvedilol 12.5 MG tablet Commonly known as: COREG Take 1 tablet (12.5 mg total) by mouth 2 (two) times daily with a meal. What changed:  medication strength how much to take   Dexcom G7 Sensor Misc Apply each device on your skin every 10 days   gabapentin 300 MG capsule Commonly known as: Neurontin Take 1 capsule (300 mg total) by mouth  daily. What changed: when to take this   hydrALAZINE 50 MG tablet Commonly known as: APRESOLINE Take 1 tablet (50 mg total) by mouth every 8 (eight) hours. What changed: how much to take   hydrOXYzine 25 MG tablet Commonly known as: ATARAX Take 1 tablet (25 mg total) by mouth at bedtime.   isosorbide mononitrate 30 MG 24 hr tablet Commonly known as: IMDUR Take 1 tablet (30 mg total) by mouth daily.   nitroGLYCERIN 0.4 MG SL tablet Commonly known as: NITROSTAT Place 0.4 mg under the tongue every 5 (five) minutes as needed for chest pain.   NovoLIN 70/30 Kwikpen (70-30) 100 UNIT/ML KwikPen Generic drug: insulin isophane & regular human KwikPen Inject 5 Units into the skin 2 (two) times daily. What changed: Another medication with the same name was removed. Continue taking this medication, and follow the directions you see here.   oxyCODONE 5 MG immediate release tablet Commonly known as: Roxicodone Take 1 tablet (5 mg total) by mouth every 4 (four) hours as needed for up to 5 days for severe pain.   traZODone 50 MG tablet Commonly known as: DESYREL Take 50 mg by mouth at bedtime.   trimethobenzamide 300 MG capsule Commonly known as: Tigan Take 1 capsule (300 mg total) by mouth 3 (three) times daily as needed for up to 14 days for nausea/vomiting.   Unifine Pentips 32G X 4 MM  Misc Generic drug: Insulin Pen Needle Use in the morning and at bedtime   Vitamin D (Ergocalciferol) 1.25 MG (50000 UNIT) Caps capsule Commonly known as: DRISDOL Take 1 capsule (50,000 Units total) by mouth every 7 (seven) days.               Durable Medical Equipment  (From admission, onward)           Start     Ordered   07/02/22 1645  For home use only DME 4 wheeled rolling walker with seat  Once       Question:  Patient needs a walker to treat with the following condition  Answer:  Debility   07/02/22 1644            Disposition and follow-up:   Ms.Amber Cross was  discharged from [facility] in Stable condition.  At the hospital follow up visit please address:  1.  Follow-up:  a.  ESRD: Ensure patient has compliance with her outpatient dialysis schedule.  Ensure patient has good follow-up with Nephrology.     b.  Bilateral pleural effusions: Patient discharged after pleurodesis and removal of chest tube.  Ensure patient's respiratory status remained stable.  Ensure patient is compliant with dialysis.  Ensure patient has good follow-up with CT surgery.   c.  Hypertension: Patient discharged on hydralazine, and plan is to decrease hydralazine as patient's volume status becomes euvolemic per nephrology.  Continue to titrate blood pressure medications.  Ensure patient continues Imdur, Coreg, and amlodipine outpatient.  Follow-up blood pressure outpatient  2.  Labs / imaging needed at time of follow-up: CBC and CXR  3.  Pending labs/ test needing follow-up: N/A  4.  Medication Changes  1) Patient discharged on atorvastatin  Follow-up Appointments:  Follow-up Information     [facility] Follow up in 6 week(s).   Specialty: Vascular Surgery Why: Office will call you to arrange your appt (sent). Contact information: [place], [facility]. Go on 07/03/2022.   Why: Schedule is Monday/Wednesday/Friday with 6:50 am chair time.  On Wednesday, please arrive at 6:00 am to complete paperwork prior to first treatment. Contact information: [address] [place] [address] [phone]         Iona Beard, MD. Go on 07/09/2022.   Specialty: Internal Medicine Why: Please go to your appointment on July 09, 2022 at 9:15 AM with Dr.Liang at the internal medicine center Contact information: [place] [id number] [phone]                 Hospital Course by problem list: Amber Cross  is a 61 y.o. female living with HFrEF, type 2 diabetes, small cell lung cancer, CKD 4 who presented with acute on chronic hypoxic respiratory failure secondary to moderate-large pericardial effusion with cardiac tamponade.  Patient underwent subxiphoid pericardiocentesis on 11/21 which collected about 5 cc of clear fluid.  Cytology of the pleural and pericardial fluid were negative for malignancy.  Patient was observed in the ICU after procedure, and slowly was downgraded to progressive.   #Cardiac tamponade s/p pericardial window, resolved  Patient initially presented to the emergency room with concerns of hypertension with blood pressures into the 200s.  There was concern for heart failure exacerbation and echo was performed, showing large pericardial effusion.  Patient was found to be in cardiac tamponade and  patient required urgent pericardial window with cardiothoracic surgery.  Cytology was negative for any malignancy.  Patient was observed in the ICU afterwards and progressed well.  Patient respiratory symptoms resolved, and patient's blood pressure was stable afterwards.    #ESRD #Nephrotic range proteinuria #Non anion gap metabolic acidosis likely in the setting of ESRD During hospitalization, patient initially was CKD stage IV, and progressed to ESRD.  Patient started hemodialysis during hospitalization, and had AV fistula placed for permanent access.  Patient has received HD during hospitalization, and tolerated well.  Patient to get chair outpatient for dialysis Monday Wednesday Friday.    #Right pleural effusion s/p chest tube placement, resolving Patient developed recurrent right pleural effusion, as well as left pleural effusions during hospitalization.  Patient required chest tube.  Due to recurrence on right side, patient had pleurodesis with removal of chest tube.  Pleural effusions likely secondary to heart failure with reduced ejection fraction and worsening renal failure.  Continue HD  outpatient.  Respiratory status remained stable.   #HFrEF #Hypertension Patient blood pressures remained stable during hospitalization with no hypotension.  Patient was continued on hydralazine 50 mg 3 times daily, Coreg 12.5 mg twice daily, Imdur 30 mg daily, and amlodipine 10 mg daily.  #Hypokalemia Patient's electrolytes were replaced with HD during hospitalization  #Small right apical pneumothorax Patient's chest x-ray showed recurrent right apical pneumothorax, which resolved on f/u XR.   #Stage IA small cell lung cancer in Right upper lobe #Tobacco use disorder Diagnosed in May 2023.  Patient has had radiation treatment in the past.  CT chest in hospital did not show nodule given opacities.  MRI of the brain during hospitalization did not show any mets.  Continue to follow-up outpatient.   #Anemia of Chronic disease  Patient hemoglobin remained stable during hospitalization.  No concerns.   #Type 2 diabetes Patient's A1c during hospitalization 6.7.  Patient remained on sliding scale insulin during hospitalization with no concerns.  Patient discharged on home regimen of insulin.     Discharge Subjective:  Patient states that she is doing well.  She states that she did have some pain today from her AV fistula.  Patient did get Dilaudid with resolution of the pain.  Patient no concerns today.  She is understanding of her plan.  Discharge Exam:   BP (!) 177/68 (BP Location: Right Arm)   Pulse 90   Temp 98.6 F (37 C) (Oral)   Resp 11   Ht 5\' 7"  (1.702 m)   Wt 63.3 kg   LMP 09/29/2011   SpO2 94%   Breastfeeding No   BMI 21.86 kg/m  General: Patient resting comfortably in bed in no acute distress HENT: Normocephalic, atraumatic  Cardio: Regular rate and rhythm, no murmurs, rubs or gallops  Pulmonary: Decreased breath sounds to the right lower lung base otherwise clear to auscultation bilaterally. Abdomen: Soft, nontender with normoactive bowel sounds Extremity: Left  upper extremity with AV fistula site with good closure, with no oozing or bleeding noted.  There is noticeable thrill on palpation of AV graft.  Pertinent Labs, Studies, and Procedures:     Latest Ref Rng & Units 07/02/2022   12:33 AM 07/01/2022   12:26 AM 06/30/2022   12:13 AM  CBC  WBC 4.0 - 10.5 K/uL 12.2  10.3  9.8   Hemoglobin 12.0 - 15.0 g/dL 8.9  8.7  9.3   Hematocrit 36.0 - 46.0 % 29.0  27.6  28.2   Platelets 150 - 400 K/uL  488  451  434        Latest Ref Rng & Units 07/02/2022   12:33 AM 07/01/2022   12:26 AM 06/30/2022   12:13 AM  CMP  Glucose 70 - 99 mg/dL 139  150  124   BUN 6 - 20 mg/dL 29  23  19    Creatinine 0.44 - 1.00 mg/dL 4.29  3.48  2.71   Sodium 135 - 145 mmol/L 136  132  138   Potassium 3.5 - 5.1 mmol/L 3.6  3.4  3.2   Chloride 98 - 111 mmol/L 102  98  101   CO2 22 - 32 mmol/L 25  25  28    Calcium 8.9 - 10.3 mg/dL 8.0  7.7  8.0     CT CHEST WO CONTRAST  Result Date: 06/17/2022 CLINICAL DATA:  Abnormal xray - lung nodule, >= 1 cm EXAM: CT CHEST WITHOUT CONTRAST TECHNIQUE: Multidetector CT imaging of the chest was performed following the standard protocol without IV contrast. RADIATION DOSE REDUCTION: This exam was performed according to the departmental dose-optimization program which includes automated exposure control, adjustment of the mA and/or kV according to patient size and/or use of iterative reconstruction technique. COMPARISON:  CT chest 03/26/2022 clear in a CT chest 01/03/2022 FINDINGS: Cardiovascular: Similar-appearing enlarged cardiac silhouette. Similar-appearing at least moderate volume pericardial effusion. The thoracic aorta is normal in caliber. Mild atherosclerotic plaque of the thoracic aorta. At least 2 vessel coronary artery calcifications. Mediastinum/Nodes: No gross hilar adenopathy, noting limited sensitivity for the detection of hilar adenopathy on this noncontrast study. No enlarged mediastinal or axillary lymph nodes. Thyroid gland,  trachea, and esophagus demonstrate no significant findings. Lungs/Pleura: At least mild centrilobular emphysematous changes. Disuse bronchial wall thickening. Partial collapse of the bilateral lower lobes. Interval development of right apical peribronchovascular ground-glass and consolidative airspace opacities. Redemonstration of metallic density within the right apex. Previously visualized pulmonary nodule not identified due to overlying airspace opacities. Stable left upper lobe ground-glass scattered airspace opacities (4:40, 67, 80)). No new pulmonary nodule. No pulmonary mass. Slightly increased in size small volume right and trace volume left pleural effusions. No pneumothorax. Upper Abdomen: No acute abnormality. Musculoskeletal: No chest wall abnormality. No suspicious lytic or blastic osseous lesions. No acute displaced fracture. Interval development of a subacute mid sternal body fracture. IMPRESSION: 1. Interval development of right apical peribronchovascular ground-glass and consolidative airspace opacities. Previously visualized pulmonary nodule not identified due to overlying airspace opacities. Finding may represent infection/inflammation. Underlying malignancy not excluded. 2. Stable left upper lobe ground-glass scattered airspace opacities. 3. Slightly increased in size small volume right and trace volume left pleural effusions. Associated partial collapse of the lower lobes. 4. Markedly limited evaluation on this noncontrast study. 5. Stable cardiomegaly with similar appearing at least moderate volume pericardial effusion. 6. Interval development of a subacute mid sternal body fracture. 7. Aortic Atherosclerosis (ICD10-I70.0) and Emphysema (ICD10-J43.9). Electronically Signed   By: Iven Finn M.D.   On: 06/17/2022 21:15   ECHOCARDIOGRAM COMPLETE  Result Date: 06/17/2022    ECHOCARDIOGRAM REPORT   Patient Name:   [facility] Date of Exam: 06/17/2022 Medical Rec #:  [id number]        Height:       67.0 in Accession #:    [id number]      Weight:       147.9 lb Date of Birth:  November 16, 1960       BSA:          1.779 m  Patient Age:    40 years        BP:           185/88 mmHg Patient Gender: F               HR:           83 bpm. Exam Location:  Inpatient Procedure: 2D Echo, Cardiac Doppler and Color Doppler Indications:    Congestive Heart Failure I50.9  History:        Patient has prior history of Echocardiogram examinations, most                 recent 03/14/2022. CHF, Stroke; Risk Factors:Hypertension,                 Diabetes and Dyslipidemia. Small cell lung cancer, right upper                 lobe ([place]).  Sonographer:    Alvino Chapel RCS Referring Phys: [place]  1. Compared with the echo 02/2022, pericardial effusion is larger.  2. Left ventricular ejection fraction, by estimation, is 35 to 40%. The left ventricle has mild to moderately decreased function. The left ventricle demonstrates regional wall motion abnormalities (see scoring diagram/findings for description). There is  moderate concentric left ventricular hypertrophy. Left ventricular diastolic parameters are consistent with Grade I diastolic dysfunction (impaired relaxation).  3. Right ventricular systolic function is normal. The right ventricular size is normal. There is mildly elevated pulmonary artery systolic pressure.  4. Left atrial size was severely dilated.  5. Large pericardial effusion measuring up to 1.8 cm. Large pericardial effusion. The pericardial effusion is circumferential. Findings are consistent with cardiac tamponade. Large pleural effusion in the left lateral region.  6. The mitral valve is normal in structure. Mild mitral valve regurgitation. No evidence of mitral stenosis.  7. The aortic valve is tricuspid. There is mild calcification of the aortic valve. There is mild thickening of the aortic valve. Aortic valve regurgitation is not visualized. No aortic stenosis is present.  8. The  inferior vena cava is dilated in size with <50% respiratory variability, suggesting right atrial pressure of 15 mmHg. FINDINGS  Left Ventricle: Left ventricular ejection fraction, by estimation, is 35 to 40%. The left ventricle has mild to moderately decreased function. The left ventricle demonstrates regional wall motion abnormalities. The left ventricular internal cavity size was normal in size. There is moderate concentric left ventricular hypertrophy. Left ventricular diastolic parameters are consistent with Grade I diastolic dysfunction (impaired relaxation).  LV Wall Scoring: The entire anterior wall, entire lateral wall, inferior septum, and entire inferior wall are hypokinetic. The anterior septum and apex are normal. Right Ventricle: The right ventricular size is normal. No increase in right ventricular wall thickness. Right ventricular systolic function is normal. There is mildly elevated pulmonary artery systolic pressure. The tricuspid regurgitant velocity is 2.52  m/s, and with an assumed right atrial pressure of 15 mmHg, the estimated right ventricular systolic pressure is 99.3 mmHg. Left Atrium: Left atrial size was severely dilated. Right Atrium: Right atrial size was normal in size. Pericardium: Large pericardial effusion measuring up to 1.8 cm. A large pericardial effusion is present. The pericardial effusion is circumferential. There is diastolic collapse of the right ventricular free wall, diastolic collapse of the right atrial wall and excessive respiratory variation in the tricuspid valve spectral Doppler velocities. There is evidence of cardiac tamponade. Mitral Valve: The mitral valve is normal in structure. There  is mild thickening of the anterior mitral valve leaflet(s). Mild mitral valve regurgitation. No evidence of mitral valve stenosis. Tricuspid Valve: The tricuspid valve is normal in structure. Tricuspid valve regurgitation is mild . No evidence of tricuspid stenosis. Aortic Valve:  The aortic valve is tricuspid. There is mild calcification of the aortic valve. There is mild thickening of the aortic valve. Aortic valve regurgitation is not visualized. No aortic stenosis is present. Pulmonic Valve: The pulmonic valve was normal in structure. Pulmonic valve regurgitation is mild. No evidence of pulmonic stenosis. Aorta: The aortic root is normal in size and structure. Venous: The inferior vena cava is dilated in size with less than 50% respiratory variability, suggesting right atrial pressure of 15 mmHg. IAS/Shunts: No atrial level shunt detected by color flow Doppler. Additional Comments: Findings are consistent with tamponade. There is a large pleural effusion in the left lateral region.  LEFT VENTRICLE PLAX 2D LVIDd:         4.80 cm      Diastology LVIDs:         4.10 cm      LV e' medial:    4.43 cm/s LV PW:         1.70 cm      LV E/e' medial:  25.5 LV IVS:        1.50 cm      LV e' lateral:   3.90 cm/s LVOT diam:     1.90 cm      LV E/e' lateral: 29.0 LV SV:         61 LV SV Index:   34 LVOT Area:     2.84 cm  LV Volumes (MOD) LV vol d, MOD A2C: 155.0 ml LV vol d, MOD A4C: 148.0 ml LV vol s, MOD A2C: 72.2 ml LV vol s, MOD A4C: 83.8 ml LV SV MOD A2C:     82.8 ml LV SV MOD A4C:     148.0 ml LV SV MOD BP:      72.7 ml RIGHT VENTRICLE RV S prime:     8.78 cm/s TAPSE (M-mode): 2.2 cm LEFT ATRIUM             Index        RIGHT ATRIUM           Index LA diam:        4.00 cm 2.25 cm/m   RA Area:     16.10 cm LA Vol (A2C):   96.3 ml 54.13 ml/m  RA Volume:   42.30 ml  23.78 ml/m LA Vol (A4C):   77.6 ml 43.62 ml/m LA Biplane Vol: 87.8 ml 49.35 ml/m  AORTIC VALVE LVOT Vmax:   101.00 cm/s LVOT Vmean:  63.800 cm/s LVOT VTI:    0.214 m  AORTA Ao Root diam: 3.50 cm MITRAL VALVE                  TRICUSPID VALVE MV Area (PHT): 4.06 cm       TR Peak grad:   25.4 mmHg MV Decel Time: 187 msec       TR Vmax:        252.00 cm/s MR Peak grad:    132.2 mmHg MR Mean grad:    82.0 mmHg    SHUNTS MR Vmax:          575.00 cm/s  Systemic VTI:  0.21 m MR Vmean:        415.0 cm/s   Systemic Diam:  1.90 cm MR PISA:         3.08 cm MR PISA Eff ROA: 13 mm MR PISA Radius:  0.70 cm MV E velocity: 113.00 cm/s MV A velocity: 148.00 cm/s MV E/A ratio:  0.76 Skeet Latch MD Electronically signed by Skeet Latch MD Signature Date/Time: 06/17/2022/2:28:32 PM    Final    US RENAL  Result Date: 06/17/2022 CLINICAL DATA:  Acute renal insufficiency. EXAM: RENAL / URINARY TRACT ULTRASOUND COMPLETE COMPARISON:  March 14, 2022. FINDINGS: Right Kidney: Renal measurements: 12.2 x 7.0 x 6.4 cm = volume: 240 mL. Increased echogenicity of renal parenchyma is noted. 1.3 cm simple cyst is seen in upper pole. No mass or hydronephrosis visualized. Left Kidney: Renal measurements: 12.4 x 6.6 x 6.3 cm = volume: 269 mL. Increased echogenicity of renal parenchyma is noted. No mass or hydronephrosis visualized. Bladder: Appears normal for degree of bladder distention. Other: None. IMPRESSION: Increased echogenicity of renal parenchyma is noted bilaterally consistent with medical renal disease. No hydronephrosis or renal obstruction is noted. Electronically Signed   By: Marijo Conception M.D.   On: 06/17/2022 13:57   DG Chest Port 1 View  Result Date: 06/16/2022 CLINICAL DATA:  Shortness of breath EXAM: PORTABLE CHEST 1 VIEW COMPARISON:  April 30, 2022 FINDINGS: Mediastinal contour is normal. The heart size is enlarged. Patchy consolidation of bilateral lung bases with bilateral pleural effusions are noted. Mild increased pulmonary interstitium is identified bilaterally. The visualized skeletal structures are unremarkable. IMPRESSION: 1. Congestive heart failure. 2. Patchy consolidation of bilateral lung bases with bilateral pleural effusions. Electronically Signed   By: Abelardo Diesel M.D.   On: 06/16/2022 10:22     Discharge Instructions: Discharge Instructions     Call MD for:  difficulty breathing, headache or visual disturbances    Complete by: As directed    Call MD for:  extreme fatigue   Complete by: As directed    Call MD for:  hives   Complete by: As directed    Call MD for:  persistant dizziness or light-headedness   Complete by: As directed    Call MD for:  persistant nausea and vomiting   Complete by: As directed    Call MD for:  redness, tenderness, or signs of infection (pain, swelling, redness, odor or green/yellow discharge around incision site)   Complete by: As directed    Call MD for:  severe uncontrolled pain   Complete by: As directed    Call MD for:  temperature >100.4   Complete by: As directed    Diet - low sodium heart healthy   Complete by: As directed    Increase activity slowly   Complete by: As directed    No wound care   Complete by: As directed       Ms. Rafael Quesada,  It was a pleasure taking care of you at [facility] were admitted for a pericardial effusion, and treated with surgery.  We also started you on dialysis during your hospitalization.  We are discharging you home now that you are doing better. Please follow the following instructions.   1) Regarding your home medications, please keep taking your home medications as prescribed.  We have changed you from rosuvastatin to atorvastatin, please start taking atorvastatin.  2) With regards to your hemodialysis schedule, you are scheduled to go to the [facility] MWF.  Will plan to continue dialysis outpatient through tunneled catheter, until AV fistula matures.  3)  I have scheduled you an appointment with Dr. Lisabeth Devoid for hospital follow-up on 07/09/2022 at 9:15 AM.  It is important to make it to this appointment.  4) Please follow-up with your nephrologist and your CT surgeon.  5) If you develop respiratory symptoms, please present back to the emergency room, if you develop any shortness of breath or chest pain, please return back to the emergency room  Take care,  Dr. Leigh Aurora, DO    Signed: Leigh Aurora, DO 07/02/2022, 6:15 PM   Pager: [phone]

## 2022-07-02 NOTE — Progress Notes (Signed)
Advised by attending that pt will likely d/c this afternoon/evening. Per nephrologist, pt will start out-pt HD at clinic tomorrow since pt did not receive HD today. Spoke to pt via phone to inquire if she was aware of this info. Pt advised that if she d/c this afternoon then she will need to start at out-pt clinic tomorrow. Pt reminded that she will need to arrive at 6:00 am to complete paperwork prior to 6:50 am chair time. Pt voices understanding and agreeable to plan. Contacted [facility] and spoke to [place]. Clinic aware pt will likely d/c today and start tomorrow morning. Renal PA has sent orders to clinic and received per [facility]. Out-pt HD arrangements added to pt's AVS.   [name] Renal Navigator [phone]

## 2022-07-03 ENCOUNTER — Telehealth: Payer: Self-pay

## 2022-07-03 ENCOUNTER — Other Ambulatory Visit (HOSPITAL_COMMUNITY): Payer: Self-pay

## 2022-07-03 ENCOUNTER — Ambulatory Visit: Payer: Commercial Managed Care - HMO | Admitting: Urology

## 2022-07-03 NOTE — Telephone Encounter (Signed)
Transition Care Management Follow-up Telephone Call Date of discharge and from where: [facility] 07/02/2022 How have you been since you were released from the hospital? better Any questions or concerns? Yes  Items Reviewed: Did the pt receive and understand the discharge instructions provided? Yes  Medications obtained and verified? Yes  Other? No  Any new allergies since your discharge? Yes  Dietary orders reviewed? Yes Do you have support at home? Yes   Home Care and Equipment/Supplies: Were home health services ordered? no If so, what is the name of the agency? N/a  Has the agency set up a time to come to the patient's home? no Were any new equipment or medical supplies ordered?  No What is the name of the medical supply agency? N/a Were you able to get the supplies/equipment? not applicable Do you have any questions related to the use of the equipment or supplies? No  Functional Questionnaire: (I = Independent and D = Dependent) ADLs: I  Bathing/Dressing- I  Meal Prep- I  Eating- I  Maintaining continence- I  Transferring/Ambulation- I  Managing Meds- I  Follow up appointments reviewed:  PCP Hospital f/u appt confirmed? Yes  Scheduled to see Dr Lisabeth Devoid on 07/09/2022 @ 9:15. [facility] f/u appt confirmed? No  Patient will call Are transportation arrangements needed? No  If their condition worsens, is the pt aware to call PCP or go to the Emergency Dept.? Yes Was the patient provided with contact information for the PCP's office or ED? Yes Was to pt encouraged to call back with questions or concerns? Yes  Juanda Crumble, LPN [facility] [phone]

## 2022-07-04 ENCOUNTER — Telehealth: Payer: Self-pay | Admitting: *Deleted

## 2022-07-04 NOTE — Telephone Encounter (Signed)
Patient called in stating Rx for oxycodone sent 12/5 is requiring a PA. Will forward to PA staff.

## 2022-07-09 ENCOUNTER — Encounter: Payer: Commercial Managed Care - HMO | Admitting: Student

## 2022-07-10 ENCOUNTER — Other Ambulatory Visit: Payer: Self-pay | Admitting: Internal Medicine

## 2022-07-10 DIAGNOSIS — D539 Nutritional anemia, unspecified: Secondary | ICD-10-CM

## 2022-07-11 ENCOUNTER — Inpatient Hospital Stay: Payer: Commercial Managed Care - HMO | Admitting: Internal Medicine

## 2022-07-11 ENCOUNTER — Inpatient Hospital Stay: Payer: Commercial Managed Care - HMO

## 2022-07-15 ENCOUNTER — Telehealth: Payer: Self-pay | Admitting: Internal Medicine

## 2022-07-15 ENCOUNTER — Other Ambulatory Visit: Payer: Self-pay

## 2022-07-15 DIAGNOSIS — E1142 Type 2 diabetes mellitus with diabetic polyneuropathy: Secondary | ICD-10-CM

## 2022-07-15 NOTE — Telephone Encounter (Signed)
Contacted patient to scheduled appointments. Patient is aware of appointments that are scheduled.   

## 2022-07-15 NOTE — Telephone Encounter (Signed)
Called pt to find out which meds she currently need refills. Also to schedule an appt - LOV was 02/2022. Appt has been scheduled on 07/25/22 with Dr Sherrie George.

## 2022-07-15 NOTE — Telephone Encounter (Signed)
Requesting all meds to be filled @ [id number] - [place], [place].

## 2022-07-17 MED ORDER — ATORVASTATIN CALCIUM 80 MG PO TABS: 80.0000 mg | ORAL_TABLET | Freq: Every day | ORAL | 0 refills | Status: DC

## 2022-07-17 MED ORDER — GABAPENTIN 300 MG PO CAPS: 300.0000 mg | ORAL_CAPSULE | Freq: Every day | ORAL | 5 refills | Status: DC

## 2022-07-17 MED ORDER — TRAZODONE HCL 50 MG PO TABS: 50.0000 mg | ORAL_TABLET | Freq: Every day | ORAL | 3 refills | Status: DC

## 2022-07-17 MED ORDER — ISOSORBIDE MONONITRATE ER 30 MG PO TB24: 30.0000 mg | ORAL_TABLET | Freq: Every day | ORAL | 5 refills | Status: DC

## 2022-07-17 MED ORDER — AMLODIPINE BESYLATE 10 MG PO TABS: 10.0000 mg | ORAL_TABLET | Freq: Every day | ORAL | 5 refills | Status: DC

## 2022-07-17 MED ORDER — HYDRALAZINE HCL 50 MG PO TABS: 50.0000 mg | ORAL_TABLET | Freq: Three times a day (TID) | ORAL | 5 refills | Status: DC

## 2022-07-17 MED ORDER — CARVEDILOL 12.5 MG PO TABS: 12.5000 mg | ORAL_TABLET | Freq: Two times a day (BID) | ORAL | 1 refills | Status: DC

## 2022-07-19 LAB — FUNGAL ORGANISM REFLEX

## 2022-07-19 LAB — FUNGUS CULTURE WITH STAIN

## 2022-07-19 LAB — FUNGUS CULTURE RESULT

## 2022-07-25 ENCOUNTER — Ambulatory Visit (INDEPENDENT_AMBULATORY_CARE_PROVIDER_SITE_OTHER): Payer: Commercial Managed Care - HMO | Admitting: Student

## 2022-07-25 DIAGNOSIS — N186 End stage renal disease: Secondary | ICD-10-CM

## 2022-07-25 NOTE — Progress Notes (Signed)
I called patient but unsuccessful.  Left voicemail to call back.  I also attempted two other numbers including the son's phone number without any success.

## 2022-07-26 ENCOUNTER — Other Ambulatory Visit: Payer: Self-pay | Admitting: Cardiothoracic Surgery

## 2022-07-26 DIAGNOSIS — Z9889 Other specified postprocedural states: Secondary | ICD-10-CM

## 2022-07-30 ENCOUNTER — Ambulatory Visit: Payer: Self-pay | Admitting: Cardiothoracic Surgery

## 2022-07-31 ENCOUNTER — Telehealth (HOSPITAL_COMMUNITY): Payer: Self-pay | Admitting: *Deleted

## 2022-07-31 NOTE — Telephone Encounter (Signed)
Fax received from Dr. Otelia Santee requesting catheter exchange due to damaged catheter limb. Spoke with [facility]. Ok to go to surgery scheduling. Will give to [facility].

## 2022-08-01 ENCOUNTER — Inpatient Hospital Stay: Payer: 59 | Admitting: Internal Medicine

## 2022-08-01 ENCOUNTER — Inpatient Hospital Stay: Payer: 59 | Attending: Radiation Oncology

## 2022-08-01 NOTE — Progress Notes (Deleted)
Amber Cross   [phone]  CONSULT NOTE  REFERRING PHYSICIAN:  REASON FOR CONSULTATION:  ***  HPI Amber Cross is a 62 y.o. female.  *** HPI  Past Medical History:  Diagnosis Date   Anemia    Arthritis    CHF (congestive heart failure) (HCC)    Chronic kidney disease    Depression    Diabetes mellitus    Headache    Hypercholesteremia    Hypertension    Pseudogout    Stroke (HCC) 10/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   Tobacco abuse     Past Surgical History:  Procedure Laterality Date   ANTERIOR CRUCIATE LIGAMENT REPAIR Right    AV FISTULA PLACEMENT Left 07/01/2022   Procedure: LEFT ARTERIOVENOUS (AV) FISTULA CREATION;  Surgeon: Chuck Hint, MD;  Location: [facility] OR;  Service: Vascular;  Laterality: Left;  with regional block   BRONCHIAL BIOPSY  12/18/2021   Procedure: BRONCHIAL BIOPSIES;  Surgeon: Josephine Igo, DO;  Location: [facility] ENDOSCOPY;  Service: Pulmonary;;   BRONCHIAL BRUSHINGS  12/18/2021   Procedure: BRONCHIAL BRUSHINGS;  Surgeon: Josephine Igo, DO;  Location: [facility];  Service: Pulmonary;;   BRONCHIAL NEEDLE ASPIRATION BIOPSY  12/18/2021   Procedure: BRONCHIAL NEEDLE ASPIRATION BIOPSIES;  Surgeon: Josephine Igo, DO;  Location: [facility];  Service: Pulmonary;;   CATARACT EXTRACTION     CHEST TUBE INSERTION Right 06/18/2022   Procedure: CHEST TUBE INSERTION;  Surgeon: Lovett Sox, MD;  Location: [facility] OR;  Service: Thoracic;  Laterality: Right;   FIDUCIAL MARKER PLACEMENT  12/18/2021   Procedure: FIDUCIAL MARKER PLACEMENT;  Surgeon: Josephine Igo, DO;  Location: [facility];  Service: Pulmonary;;   IR FLUORO GUIDE CV LINE RIGHT  06/26/2022   IR US GUIDE VASC ACCESS RIGHT  06/26/2022   OVARY SURGERY     RIGHT OOPHORECTOMY Right    SMALL INTESTINE SURGERY     SUBXYPHOID PERICARDIAL WINDOW N/A 06/18/2022   Procedure: SUBXYPHOID PERICARDIAL WINDOW;  Surgeon: Lovett Sox, MD;  Location: [facility] OR;   Service: Thoracic;  Laterality: N/A;   TEE WITHOUT CARDIOVERSION N/A 06/18/2022   Procedure: TRANSESOPHAGEAL ECHOCARDIOGRAM (TEE);  Surgeon: Lovett Sox, MD;  Location[facility] OR;  Service: Thoracic;  Laterality: N/A;   TUBAL LIGATION     VIDEO BRONCHOSCOPY WITH RADIAL ENDOBRONCHIAL ULTRASOUND  12/18/2021   Procedure: RADIAL ENDOBRONCHIAL ULTRASOUND;  Surgeon: Josephine Igo, DO;  Location: [facility];  Service: Pulmonary;;    Family History  Problem Relation Age of Onset   Diabetes Mother    Hypertension Mother    Hypertension Father     Social History Social History   Tobacco Use   Smoking status: Every Day    Packs/day: 0.50    Years: 40.00    Total pack years: 20.00    Types: Cigarettes   Smokeless tobacco: Never  Vaping Use   Vaping Use: Never used  Substance Use Topics   Alcohol use: Not Currently   Drug use: Yes    Frequency: 4.0 times per week    Types: Marijuana    Comment: smokes every other day    Allergies  Allergen Reactions   Penicillins Itching and Other (See Comments)    redness   Strawberry (Diagnostic) Itching and Swelling   Tomato Itching and Swelling    Current Outpatient Medications  Medication Sig Dispense Refill   acetaminophen (TYLENOL) 500 MG tablet Take 1,000 mg by mouth every 6 (six) hours  as needed for mild pain.     albuterol (VENTOLIN HFA) 108 (90 Base) MCG/ACT inhaler Inhale 2 puffs into the lungs every 6 (six) hours as needed for wheezing or shortness of breath. 6.7 g 2   amLODipine (NORVASC) 10 MG tablet Take 1 tablet (10 mg total) by mouth daily. 60 tablet 5   aspirin EC 81 MG tablet Take 1 tablet (81 mg total) by mouth daily. Swallow whole. 30 tablet 11   atorvastatin (LIPITOR) 80 MG tablet Take 1 tablet (80 mg total) by mouth daily. 30 tablet 0   carvedilol (COREG) 12.5 MG tablet Take 1 tablet (12.5 mg total) by mouth 2 (two) times daily with a meal. 60 tablet 1   Continuous Blood Gluc Sensor (DEXCOM G7 SENSOR) MISC Apply each  device on your skin every 10 days 3 each 2   gabapentin (NEURONTIN) 300 MG capsule Take 1 capsule (300 mg total) by mouth daily. 30 capsule 5   hydrALAZINE (APRESOLINE) 50 MG tablet Take 1 tablet (50 mg total) by mouth every 8 (eight) hours. 90 tablet 5   hydrOXYzine (ATARAX) 25 MG tablet Take 1 tablet (25 mg total) by mouth at bedtime. (Patient not taking: Reported on 05/10/2022) 30 tablet 0   insulin isophane & regular human KwikPen (NOVOLIN 70/30 KWIKPEN) (70-30) 100 UNIT/ML KwikPen Inject 5 Units into the skin 2 (two) times daily.     Insulin Pen Needle (UNIFINE PENTIPS) 32G X 4 MM MISC Use in the morning and at bedtime 100 each 3   isosorbide mononitrate (IMDUR) 30 MG 24 hr tablet Take 1 tablet (30 mg total) by mouth daily. 30 tablet 5   nitroGLYCERIN (NITROSTAT) 0.4 MG SL tablet Place 0.4 mg under the tongue every 5 (five) minutes as needed for chest pain.     traZODone (DESYREL) 50 MG tablet Take 1 tablet (50 mg total) by mouth at bedtime. 30 tablet 3   Vitamin D, Ergocalciferol, (DRISDOL) 1.25 MG (50000 UNIT) CAPS capsule Take 1 capsule (50,000 Units total) by mouth every 7 (seven) days. 5 capsule 0   No current facility-administered medications for this visit.    Review of Systems  {Ros - complete:30496}  Physical Exam  RAL:{CHL ONC PE GENERAL:[phone]} SKIN: {CHL ONC PE LUKY:8991351670} HEAD: {CHL ONC PE VHJX:8444881912} EYES: {CHL ONC PE AQUS:0109873473} EARS: {CHL ONC PE FGFE:5754862973} OROPHARYNX:{CHL ONC PE [phone]}  NECK: {CHL ONC PE LNOE:3073164462} LYMPH:  {CHL ONC PE GNGEI:9566521192} BREAST:{CHL ONC PE BREAST:[phone]} LUNGS: {CHL ONC PE OVIKR:7347702341} HEART: {CHL ONC PE GTLMQ:8948762291} ABDOMEN:{CHL ONC PE ABDOMEN:[phone]} BACK: {CHL ONC PE MCLK:7450493124} EXTREMITIES:{CHL ONC PE EXTREMITIES:[phone]}  NEURO: {CHL ONC PE [phone]}  PERFORMANCE STATUS: ECOG ***  LABORATORY DATA: Lab Results  Component Value Date   WBC  12.2 (H) 07/02/2022   HGB 8.9 (L) 07/02/2022   HCT 29.0 (L) 07/02/2022   MCV 89.2 07/02/2022   PLT 488 (H) 07/02/2022      Chemistry      Component Value Date/Time   NA 136 07/02/2022 0033   NA 138 02/20/2022 1207   K 3.6 07/02/2022 0033   CL 102 07/02/2022 0033   CO2 25 07/02/2022 0033   BUN 29 (H) 07/02/2022 0033   BUN 25 02/20/2022 1207   CREATININE 4.29 (H) 07/02/2022 0033      Component Value Date/Time   CALCIUM 8.0 (L) 07/02/2022 0033   ALKPHOS 172 (H) 06/24/2022 0450   AST 14 (L) 06/24/2022 0450   ALT 5 06/24/2022 0450   BILITOT 0.5 06/24/2022 0450  RADIOGRAPHIC STUDIES: DG CHEST PORT 1 VIEW  Result Date: 07/02/2022 CLINICAL DATA:  Chest tube removal. EXAM: PORTABLE CHEST 1 VIEW COMPARISON:  06/30/2022 FINDINGS: Right IJ dialysis catheter unchanged. Interval removal of right pleural drainage catheter. Lungs are adequately inflated with interval worsening of moderate size right pleural effusion with stable left pleural effusion. Likely associated bibasilar atelectasis. No evidence of pneumothorax. Cardiomediastinal silhouette and remainder the exam is unchanged. IMPRESSION: 1. Interval worsening of moderate size right pleural effusion with stable left pleural effusion. Likely associated bibasilar atelectasis. 2. Right IJ dialysis catheter unchanged. Electronically Signed   By: Elberta Fortis M.D.   On: 07/02/2022 14:29    ASSESSMENT:   PLAN:  The patient voices understanding of current disease status and treatment options and is in agreement with the current care plan.  All questions were answered. The patient knows to call the clinic with any problems, questions or concerns. We can certainly see the patient much sooner if necessary.  Thank you so much for allowing me to participate in the care of Amber Cross. I will continue to follow up the patient with you and assist in her care.  I spent {CHL ONC TIME VISIT - ASAIK:5525060493} counseling the patient face  to face. The total time spent in the appointment was {CHL ONC TIME VISIT - LNFPX:9060779144}.  Disclaimer: This note was dictated with voice recognition software. Similar sounding words can inadvertently be transcribed and may not be corrected upon review.   Lajuana Matte August 01, 2022, 7:56 AM

## 2022-08-02 ENCOUNTER — Other Ambulatory Visit: Payer: Self-pay

## 2022-08-02 ENCOUNTER — Other Ambulatory Visit: Payer: Self-pay | Admitting: *Deleted

## 2022-08-02 DIAGNOSIS — T829XXA Unspecified complication of cardiac and vascular prosthetic device, implant and graft, initial encounter: Secondary | ICD-10-CM

## 2022-08-02 DIAGNOSIS — N186 End stage renal disease: Secondary | ICD-10-CM

## 2022-08-02 LAB — ACID FAST CULTURE WITH REFLEXED SENSITIVITIES (MYCOBACTERIA): Acid Fast Culture: NEGATIVE

## 2022-08-05 ENCOUNTER — Ambulatory Visit
Admission: RE | Admit: 2022-08-05 | Discharge: 2022-08-05 | Disposition: A | Discharge status: Home / Self Care | Payer: Self-pay | Source: Ambulatory Visit | Attending: Cardiothoracic Surgery | Admitting: Cardiothoracic Surgery

## 2022-08-05 ENCOUNTER — Encounter: Payer: Self-pay | Admitting: Cardiothoracic Surgery

## 2022-08-05 ENCOUNTER — Ambulatory Visit (INDEPENDENT_AMBULATORY_CARE_PROVIDER_SITE_OTHER): Payer: Self-pay | Admitting: Cardiothoracic Surgery

## 2022-08-05 VITALS — BP 169/69 | HR 100 | Resp 20 | Wt 109.0 lb

## 2022-08-05 DIAGNOSIS — Z992 Dependence on renal dialysis: Secondary | ICD-10-CM | POA: Diagnosis not present

## 2022-08-05 DIAGNOSIS — Z9889 Other specified postprocedural states: Secondary | ICD-10-CM

## 2022-08-05 DIAGNOSIS — N186 End stage renal disease: Secondary | ICD-10-CM | POA: Diagnosis not present

## 2022-08-05 DIAGNOSIS — N2581 Secondary hyperparathyroidism of renal origin: Secondary | ICD-10-CM | POA: Diagnosis not present

## 2022-08-05 DIAGNOSIS — Z09 Encounter for follow-up examination after completed treatment for conditions other than malignant neoplasm: Secondary | ICD-10-CM

## 2022-08-05 HISTORY — DX: Encounter for follow-up examination after completed treatment for conditions other than malignant neoplasm: Z09

## 2022-08-05 MED ORDER — CEPHALEXIN 500 MG PO CAPS: 500.0000 mg | ORAL_CAPSULE | Freq: Three times a day (TID) | ORAL | 0 refills | Status: DC

## 2022-08-05 NOTE — Progress Notes (Signed)
HPI: Patient returns for 6-week follow-up after xiphoid pericardial window and drainage of nonmalignant pericardial effusion as well as placement of right chest tube for drainage of a moderate nonmalignant right pleural effusion.  Cultures of both sites were negative.  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 Julien Nordmann.  While she was hospitalized she was started on hemodialysis Monday-Wednesday-Friday for chronic renal failure.  The patient denies shortness of breath or chest pain.  She states her weight has been stable and denies ankle edema.   Her subxiphoid incision is healing well.  Her right chest tube site has an eschar which was cleaned with peroxide and saline and the remaining skin sutures removed.  Her chest x-ray today shows recurrence of the moderate right pleural effusion which appears to be asymptomatic.  She does have decreased breath sounds on the right.  There appears to be an infiltrative density at the right lung apex which is suspicious for pulmonary infection according to radiology.    On cardiac exam there is no evidence of friction rub, murmur, and she is in sinus rhythm.  Current Outpatient Medications  Medication Sig Dispense Refill   acetaminophen (TYLENOL) 500 MG tablet Take 1,000 mg by mouth every 6 (six) hours as needed for mild pain.     albuterol (VENTOLIN HFA) 108 (90 Base) MCG/ACT inhaler Inhale 2 puffs into the lungs every 6 (six) hours as needed for wheezing or shortness of breath. 6.7 g 2   amLODipine (NORVASC) 10 MG tablet Take 1 tablet (10 mg total) by mouth daily. 60 tablet 5   aspirin EC 81 MG tablet Take 1 tablet (81 mg total) by mouth daily. Swallow whole. 30 tablet 11   atorvastatin (LIPITOR) 80 MG tablet Take 1 tablet (80 mg total) by mouth daily. 30 tablet 0   carvedilol (COREG) 12.5 MG tablet Take 1 tablet (12.5 mg total) by mouth 2 (two) times daily with a meal. 60 tablet 1   cephALEXin (KEFLEX) 500 MG capsule  Take 1 capsule (500 mg total) by mouth 3 (three) times daily. 21 capsule 0   Continuous Blood Gluc Sensor (DEXCOM G7 SENSOR) MISC Apply each device on your skin every 10 days 3 each 2   gabapentin (NEURONTIN) 300 MG capsule Take 1 capsule (300 mg total) by mouth daily. 30 capsule 5   hydrALAZINE (APRESOLINE) 50 MG tablet Take 1 tablet (50 mg total) by mouth every 8 (eight) hours. 90 tablet 5   Insulin Pen Needle (UNIFINE PENTIPS) 32G X 4 MM MISC Use in the morning and at bedtime 100 each 3   isosorbide mononitrate (IMDUR) 30 MG 24 hr tablet Take 1 tablet (30 mg total) by mouth daily. 30 tablet 5   nitroGLYCERIN (NITROSTAT) 0.4 MG SL tablet Place 0.4 mg under the tongue every 5 (five) minutes as needed for chest pain.     traZODone (DESYREL) 50 MG tablet Take 1 tablet (50 mg total) by mouth at bedtime. 30 tablet 3   Vitamin D, Ergocalciferol, (DRISDOL) 1.25 MG (50000 UNIT) CAPS capsule Take 1 capsule (50,000 Units total) by mouth every 7 (seven) days. 5 capsule 0   hydrOXYzine (ATARAX) 25 MG tablet Take 1 tablet (25 mg total) by mouth at bedtime. (Patient not taking: Reported on 05/10/2022) 30 tablet 0   insulin isophane & regular human KwikPen (NOVOLIN 70/30 KWIKPEN) (70-30) 100 UNIT/ML KwikPen Inject 5 Units into the skin 2 (two) times daily. (Patient not taking: Reported on 08/05/2022)  No current facility-administered medications for this visit.     Physical Exam:  Blood pressure (!) 169/69, pulse 100, resp. rate 20, weight 109 lb (49.4 kg), last menstrual period 09/29/2011, SpO2 94 %.   Diagnostic Tests:  Chest x-ray today shows reaccumulation of right moderate effusion since the patient left the hospital in late November.  Impression: Status post subxiphoid pericardial window for drainage of a moderate to large pericardial effusion which was benign, probably related to her history of radiation therapy for lung cancer.  Recurrent asymptomatic moderate right pleural effusion with  infiltrative density at the right apex-we will follow this and treat patient with oral antibiotics and have her return for repeat chest x-ray and a wound check.  Some skin separation at the site of a right chest tube placed to drain a pleural effusion which will be treated with Betadine and dry dressing changes daily. Plan: Return in 1 week for wound check, repeat chest x-ray.  1 week of oral Keflex has been prescribed to her pharmacy in [facility].   Dahlia Byes, MD [facility] [phone]

## 2022-08-07 ENCOUNTER — Encounter (HOSPITAL_COMMUNITY): Payer: Self-pay | Admitting: Surgery

## 2022-08-07 DIAGNOSIS — N2581 Secondary hyperparathyroidism of renal origin: Secondary | ICD-10-CM | POA: Diagnosis not present

## 2022-08-07 DIAGNOSIS — N186 End stage renal disease: Secondary | ICD-10-CM | POA: Diagnosis not present

## 2022-08-07 DIAGNOSIS — Z992 Dependence on renal dialysis: Secondary | ICD-10-CM | POA: Diagnosis not present

## 2022-08-07 NOTE — Progress Notes (Signed)
Cardiologist - Berniece Salines, DO Internal Med - [name], DO  Chest x-ray - 08/05/22 EKG - 06/27/22 Stress Test - 12/2020 ECHO - 06/17/22 Cardiac Cath - n/a  ICD Pacemaker/Loop - n/a  Sleep Study -  n/a CPAP - none  Do not take Novolin 70/30 Insulin on the morning of surgery unless your CBG is greater than 220 mg/Dl.  If CBG greater than 220, then  of your sliding scale (correction) dose of insulin.  If your blood sugar is less than 70 mg/dL, you will need to treat for low blood sugar: Treat a low blood sugar (less than 70 mg/dL) with  cup of clear juice (cranberry or apple), 4 glucose tablets, OR glucose gel. Recheck blood sugar in 15 minutes after treatment (to make sure it is greater than 70 mg/dL). If your blood sugar is not greater than 70 mg/dL on recheck, call [phone] for further instructions.  Anesthesia review: Yes  STOP now taking any Aspirin (unless otherwise instructed by your surgeon), Aleve, Naproxen, Ibuprofen, Motrin, Advil, Goody's, BC's, all herbal medications, fish oil, and all vitamins.   Coronavirus Screening Do you have any of the following symptoms:  Cough yes/no: No Fever (>100.56F)  yes/no: No Runny nose yes/no: No Sore throat yes/no: No Difficulty breathing/shortness of breath  yes/no: No  Have you traveled in the last 14 days and where? yes/no: No  Patient verbalized understanding of instructions that were given via phone.

## 2022-08-07 NOTE — Progress Notes (Signed)
Anesthesia Chart Review: Amber Cross  Case: [id number] Date/Time: 08/08/22 0939   Procedure: EXCHANGE OF A TUNNELED DIALYSIS CATHETER   Anesthesia type: Choice   Pre-op diagnosis: Complication of vascular dialysis access   Location: [facility] OR ROOM 16 / [place] [facility]   Surgeons: Serafina Mitchell, MD       DISCUSSION: Patient is a 62 year old female scheduled for the above procedure. She started hemodialysis last month. Her AVF has not been cleared for use. She has a malfunctioning of her tunneled dialysis catheter, so exchange recommended. Choice anesthesia. Of note, she had her 6 week follow-up visit with Dr. Darcey Nora on 08/05/22 for follow-up pericardial and right pleural effusions, s/p subxiphoid pericardial window and right chest tube on 06/18/22. She had some re-accumulation of a moderate right pleural effusion with suggestion of infectious process. He noted that she appeared to be asymptomatic but did prescribe Keflex and follow-up on 08/12/22.   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  Primary cardiologist is Dr. Harriet Masson. Last seen by cardiologist Dr. Eleonore Chiquito during 05/2022 admission for hypertensive crisis with acute on chronic systolic CHF, worsening renal function and large pericardial and right pleural effusions requiring pericardial window and right chest tube. Cytology negative.Effusions felt likely related to renal disease and/or lung radiation. Nephrology diuresising her then, but ultimately she agreed to start hemodialysis. S/p LUE AVF creation 07/01/22.  Last CT follow-up with Dr. Prescott Gum was on 08/05/22.  Subxiphoid incision healing well.  CXR showed  some recurrence of moderate right pleural effusion with underlying basilar atelectasis and/or airspace consolidation. There was also a right lung apex opacity with recommendation of a trial of antibiotic therapy to ensure resolution and to exclude underlying malignancy. Her right chest tube site also had some skin separation. He noted that she appeared asymptomatic. She denied chest pain or SOB. He prescribed oral antibiotics (Keflex) and recommended repeat CXR and wound check in 1 week.    Patient has a malfunctioning dialysis catheter and needs dialysis access. Recent CXR findings and CT surgery follow-up visit summary as previously documented discussed with anesthesiologist Criss Rosales, DO. Patient also denied cough, fever, SOB per PAT RN phone interview. Clinical correlation on the day of surgery. Definitive anesthesia plan at that time.   VS: Ht 5\' 7"  (1.702 m)   Wt 49.4 kg   LMP 09/29/2011   BMI 17.07 kg/m  BP Readings from Last 3 Encounters:  08/05/22 (!) 169/69  07/02/22 (!) 177/68  05/14/22 (!) 146/69   Pulse Readings from Last 3 Encounters:  08/05/22 100  07/02/22 90  05/14/22 82     PROVIDERS: Starlyn Skeans, MD is PCP  Eldridge Abrahams, MD his CT surgeon Berniece Salines, DO is cardiologist June Leap, DO is pulmonologist Tyler Pita, MD is RAD-ONC Rosalin Hawking, MD is neurologist   LABS: For day of surgery.    IMAGES: CXR 08/05/22: IMPRESSION: - Opacity within the right lung apex, new from the prior examination of 07/02/2022 and suspicious for pneumonia. Followup PA and lateral chest radiographs are recommended in 3-4 weeks following a trial of antibiotic therapy to ensure resolution and to exclude underlying malignancy. - Moderate right pleural effusion with underlying right basilar atelectasis  and/or airspace consolidation, increased from the prior examination exam. - Persistent small left pleural effusion.   EKG: 06/27/22 (during hospitalization for  pericardial/right pleural effusions):  Normal sinus rhythm Minimal voltage criteria for LVH, may be normal variant ( Sokolow-Lyon ) T wave abnormality, consider lateral ischemia Abnormal ECG When compared with ECG of 16-Jun-2022 10:07, PREVIOUS ECG IS PRESENT Twave changes new since previous Confirmed by Kirk Ruths [phone]) on 06/27/2022 4:12:42 PM   CV: TTE 06/21/22:  1. Left ventricular ejection fraction, by estimation, is 40 to 45%. Left  ventricular ejection fraction by 3D volume is 47 %. The left ventricle has  mildly decreased function. There is severe concentric left ventricular  hypertrophy. Left ventricular  diastolic parameters are consistent with Grade II diastolic dysfunction  (pseudonormalization).   2. Right ventricular systolic function is moderately reduced. The right  ventricular size is normal. There is mildly elevated pulmonary artery  systolic pressure. The estimated right ventricular systolic pressure is  73.7 mmHg.   3. Large pleural effusion in the left lateral region.   4. The mitral valve is abnormal. Trivial mitral valve regurgitation. No  evidence of mitral stenosis.   5. Tricuspid valve regurgitation is moderate.   6. The aortic valve is tricuspid. Aortic valve regurgitation is not  visualized. No aortic stenosis is present.   7. The inferior vena cava is dilated in size with <50% respiratory  variability, suggesting right atrial pressure of 15 mmHg.  - Comparison LVEF 35-40% 06/17/22, 40-45% 03/14/22, 55-60% 11/08/21, 40-45% 05/10/21, 25% 05/08/21     Cardiac MRI 09/21/21 ([facility]): Marland Kitchen Left ventricle: Normal size and wall thickness.  . Right ventricle: Normal size and wall thickness. No aneurysm or fibrofatty infiltration. Ejection fraction 68 %  . Left atrium: Normal size. Normal without filling defect. Left atrial appendage normal.  . Right atrium: Normal size. No filling defect  . Pericardium: Normal pericardial thickness. No effusion or  masses.  Verl Blalock motion/ejection fraction: Normal wall motion. Ejection fraction 48 %  . Aortic valve: No gross abnormalities. No regurgitant or stenotic flow jets.  . Mitral valve: No gross abnormalities. No regurgitant or stenotic flow jets.  . Tricuspid valve: No gross abnormalities. No regurgitant or stenotic flow jets.  . Pulmonic valve: No gross abnormalities. No regurgitant or stenotic flow jets.  . Myocardial edema: No myocardial edema.  . First-pass perfusion: Normal. No subendocardial defects to suggest ischemia or infarct.  . Delayed myocardial enhancement: None  . Incidental findings: Small pericardial effusion. IMPRESSION:  No infiltrative disease identified. Decreased left ventricular ejection fraction    US Carotid 11/23/21: Summary:  Right Carotid: Velocities in the right ICA are consistent with a 1-39% stenosis.  Left Carotid: Velocities in the left ICA are consistent with a 1-39% stenosis.  Vertebrals: Bilateral vertebral arteries demonstrate antegrade flow.    Nuclear stress 01/24/21: IMPRESSION:  1. No reversible ischemia or infarction.  2. Mild generalized left ventricular hypokinesis without focal wall  motion abnormality.  3. Left ventricular ejection fraction 43%  4. Non invasive risk stratification*: Intermediate   Past Medical History:  Diagnosis Date   Anemia    Arthritis    CHF (congestive heart failure) (HCC)    Chronic kidney disease    dialysis M-W-F   Depression    Diabetes mellitus    Headache    Hypercholesteremia    Hypertension    Pseudogout    Stroke ([place]) 10/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   Tobacco abuse  Past Surgical History:  Procedure Laterality Date   ANTERIOR CRUCIATE LIGAMENT REPAIR Right    AV FISTULA PLACEMENT Left 07/01/2022   Procedure: LEFT ARTERIOVENOUS (AV) FISTULA CREATION;  Surgeon: Angelia Mould, MD;  Location: [place];  Service: Vascular;  Laterality: Left;  with regional block   BRONCHIAL BIOPSY  12/18/2021    Procedure: BRONCHIAL BIOPSIES;  Surgeon: Garner Nash, DO;  Location: [place] [facility];  Service: Pulmonary;;   BRONCHIAL BRUSHINGS  12/18/2021   Procedure: BRONCHIAL BRUSHINGS;  Surgeon: Garner Nash, DO;  Location: [place];  Service: Pulmonary;;   BRONCHIAL NEEDLE ASPIRATION BIOPSY  12/18/2021   Procedure: BRONCHIAL NEEDLE ASPIRATION BIOPSIES;  Surgeon: Garner Nash, DO;  Location: [place];  Service: Pulmonary;;   CATARACT EXTRACTION     CHEST TUBE INSERTION Right 06/18/2022   Procedure: CHEST TUBE INSERTION;  Surgeon: Dahlia Byes, MD;  Location: [place] [facility];  Service: Thoracic;  Laterality: Right;   FIDUCIAL MARKER PLACEMENT  12/18/2021   Procedure: FIDUCIAL MARKER PLACEMENT;  Surgeon: Garner Nash, DO;  Location: [facility];  Service: Pulmonary;;   IR FLUORO GUIDE CV LINE RIGHT  06/26/2022   IR US GUIDE VASC ACCESS RIGHT  06/26/2022   OVARY SURGERY     RIGHT OOPHORECTOMY Right    SMALL INTESTINE SURGERY     SUBXYPHOID PERICARDIAL WINDOW N/A 06/18/2022   Procedure: SUBXYPHOID PERICARDIAL WINDOW;  Surgeon: Dahlia Byes, MD;  Location: [place];  Service: Thoracic;  Laterality: N/A;   TEE WITHOUT CARDIOVERSION N/A 06/18/2022   Procedure: TRANSESOPHAGEAL ECHOCARDIOGRAM (TEE);  Surgeon: Dahlia Byes, MD;  Location: [place];  Service: Thoracic;  Laterality: N/A;   TUBAL LIGATION     VIDEO BRONCHOSCOPY WITH RADIAL ENDOBRONCHIAL ULTRASOUND  12/18/2021   Procedure: RADIAL ENDOBRONCHIAL ULTRASOUND;  Surgeon: Garner Nash, DO;  Location: [facility];  Service: Pulmonary;;    MEDICATIONS: No current facility-administered medications for this encounter.    acetaminophen (TYLENOL) 500 MG tablet   albuterol (VENTOLIN HFA) 108 (90 Base) MCG/ACT inhaler   amLODipine (NORVASC) 10 MG tablet   aspirin EC 81 MG tablet   atorvastatin (LIPITOR) 80 MG tablet   carvedilol (COREG) 12.5 MG tablet   cephALEXin (KEFLEX) 500 MG capsule   Continuous Blood Gluc Sensor (DEXCOM G7 SENSOR)  MISC   gabapentin (NEURONTIN) 300 MG capsule   hydrALAZINE (APRESOLINE) 50 MG tablet   hydrOXYzine (ATARAX) 25 MG tablet   insulin isophane & regular human KwikPen (NOVOLIN 70/30 KWIKPEN) (70-30) 100 UNIT/ML KwikPen   Insulin Pen Needle (UNIFINE PENTIPS) 32G X 4 MM MISC   isosorbide mononitrate (IMDUR) 30 MG 24 hr tablet   nitroGLYCERIN (NITROSTAT) 0.4 MG SL tablet   traZODone (DESYREL) 50 MG tablet   Vitamin D, Ergocalciferol, (DRISDOL) 1.25 MG (50000 UNIT) CAPS capsule    Myra Gianotti, PA-C Surgical Short Stay/Anesthesiology [facility] Phone [phone] [facility] Phone [phone] 08/07/2022 2:30 PM

## 2022-08-07 NOTE — Anesthesia Preprocedure Evaluation (Signed)
Anesthesia Evaluation  Patient identified by MRN, date of birth, ID band Patient awake    Reviewed: Allergy & Precautions, H&P , NPO status , Patient's Chart, lab work & pertinent test results  Airway Mallampati: II  TM Distance: >3 FB Neck ROM: Full    Dental  (+) Poor Dentition, Dental Advisory Given, Missing, Chipped,    Pulmonary Current Smoker   Pulmonary exam normal breath sounds clear to auscultation       Cardiovascular hypertension, Pt. on home beta blockers and Pt. on medications pulmonary hypertension+CHF  Normal cardiovascular exam+ Valvular Problems/Murmurs (Mod TR)  Rhythm:Regular Rate:Normal  Echo 05/2022  1. Left ventricular ejection fraction, by estimation, is 40 to 45%. Left ventricular ejection fraction by 3D volume is 47 %. The left ventricle has mildly decreased function. There is severe concentric left ventricular hypertrophy. Left ventricular diastolic parameters are consistent with Grade II diastolic dysfunction (pseudonormalization).   2. Right ventricular systolic function is moderately reduced. The right ventricular size is normal. There is mildly elevated pulmonary artery systolic pressure. The estimated right ventricular systolic pressure is 70.0 mmHg.   3. Large pleural effusion in the left lateral region.   4. The mitral valve is abnormal. Trivial mitral valve regurgitation. No evidence of mitral stenosis.   5. Tricuspid valve regurgitation is moderate.   6. The aortic valve is tricuspid. Aortic valve regurgitation is not visualized. No aortic stenosis is present.   7. The inferior vena cava is dilated in size with <50% respiratory variability, suggesting right atrial pressure of 15 mmHg.   Comparison(s): Prior images reviewed side by side. Pericardial effusion  has improved.     Neuro/Psych  Headaches PSYCHIATRIC DISORDERS  Depression     Neuromuscular disease CVA    GI/Hepatic   Endo/Other   diabetes, Type 2    Renal/GU ESRFRenal disease     Musculoskeletal  (+) Arthritis ,    Abdominal   Peds  Hematology  (+) Blood dyscrasia, anemia   Anesthesia Other Findings   Reproductive/Obstetrics                             Anesthesia Physical Anesthesia Plan  ASA: 4  Anesthesia Plan: General   Post-op Pain Management: Tylenol PO (pre-op)*   Induction: Intravenous  PONV Risk Score and Plan: 1 and Ondansetron, Treatment may vary due to age or medical condition, Dexamethasone and Midazolam  Airway Management Planned: LMA  Additional Equipment:   Intra-op Plan:   Post-operative Plan: Extubation in OR  Informed Consent: I have reviewed the patients History and Physical, chart, labs and discussed the procedure including the risks, benefits and alternatives for the proposed anesthesia with the patient or authorized representative who has indicated his/her understanding and acceptance.     Dental advisory given  Plan Discussed with: CRNA  Anesthesia Plan Comments: (See PAT note written 08/07/2022 by Myra Gianotti, PA-C.   )       Anesthesia Quick Evaluation

## 2022-08-08 ENCOUNTER — Ambulatory Visit (HOSPITAL_COMMUNITY): Payer: 59

## 2022-08-08 ENCOUNTER — Ambulatory Visit (HOSPITAL_BASED_OUTPATIENT_CLINIC_OR_DEPARTMENT_OTHER): Payer: 59 | Admitting: Vascular Surgery

## 2022-08-08 ENCOUNTER — Ambulatory Visit (HOSPITAL_COMMUNITY): Payer: 59 | Admitting: Vascular Surgery

## 2022-08-08 ENCOUNTER — Encounter (HOSPITAL_COMMUNITY)
Admission: RE | Disposition: A | Discharge status: Home / Self Care | Payer: Self-pay | Source: Home / Self Care | Attending: Surgery

## 2022-08-08 ENCOUNTER — Other Ambulatory Visit: Payer: Self-pay

## 2022-08-08 ENCOUNTER — Ambulatory Visit (HOSPITAL_COMMUNITY)
Admission: RE | Admit: 2022-08-08 | Discharge: 2022-08-08 | Disposition: A | Discharge status: Home / Self Care | Payer: 59 | Attending: Surgery | Admitting: Surgery

## 2022-08-08 ENCOUNTER — Encounter (HOSPITAL_COMMUNITY): Payer: Self-pay | Admitting: Surgery

## 2022-08-08 DIAGNOSIS — I3139 Other pericardial effusion (noninflammatory): Secondary | ICD-10-CM | POA: Insufficient documentation

## 2022-08-08 DIAGNOSIS — Y718 Miscellaneous cardiovascular devices associated with adverse incidents, not elsewhere classified: Secondary | ICD-10-CM | POA: Insufficient documentation

## 2022-08-08 DIAGNOSIS — Z992 Dependence on renal dialysis: Secondary | ICD-10-CM | POA: Insufficient documentation

## 2022-08-08 DIAGNOSIS — N186 End stage renal disease: Secondary | ICD-10-CM | POA: Insufficient documentation

## 2022-08-08 DIAGNOSIS — Z794 Long term (current) use of insulin: Secondary | ICD-10-CM | POA: Diagnosis not present

## 2022-08-08 DIAGNOSIS — I132 Hypertensive heart and chronic kidney disease with heart failure and with stage 5 chronic kidney disease, or end stage renal disease: Secondary | ICD-10-CM

## 2022-08-08 DIAGNOSIS — N185 Chronic kidney disease, stage 5: Secondary | ICD-10-CM | POA: Diagnosis not present

## 2022-08-08 DIAGNOSIS — I509 Heart failure, unspecified: Secondary | ICD-10-CM

## 2022-08-08 DIAGNOSIS — F32A Depression, unspecified: Secondary | ICD-10-CM | POA: Insufficient documentation

## 2022-08-08 DIAGNOSIS — G709 Myoneural disorder, unspecified: Secondary | ICD-10-CM | POA: Insufficient documentation

## 2022-08-08 DIAGNOSIS — I272 Pulmonary hypertension, unspecified: Secondary | ICD-10-CM | POA: Insufficient documentation

## 2022-08-08 DIAGNOSIS — F17200 Nicotine dependence, unspecified, uncomplicated: Secondary | ICD-10-CM | POA: Insufficient documentation

## 2022-08-08 DIAGNOSIS — Z8673 Personal history of transient ischemic attack (TIA), and cerebral infarction without residual deficits: Secondary | ICD-10-CM | POA: Insufficient documentation

## 2022-08-08 DIAGNOSIS — M1990 Unspecified osteoarthritis, unspecified site: Secondary | ICD-10-CM | POA: Diagnosis not present

## 2022-08-08 DIAGNOSIS — T82898A Other specified complication of vascular prosthetic devices, implants and grafts, initial encounter: Secondary | ICD-10-CM | POA: Diagnosis not present

## 2022-08-08 DIAGNOSIS — D631 Anemia in chronic kidney disease: Secondary | ICD-10-CM | POA: Diagnosis not present

## 2022-08-08 DIAGNOSIS — E1122 Type 2 diabetes mellitus with diabetic chronic kidney disease: Secondary | ICD-10-CM | POA: Diagnosis not present

## 2022-08-08 DIAGNOSIS — T829XXA Unspecified complication of cardiac and vascular prosthetic device, implant and graft, initial encounter: Secondary | ICD-10-CM

## 2022-08-08 DIAGNOSIS — T8249XA Other complication of vascular dialysis catheter, initial encounter: Secondary | ICD-10-CM

## 2022-08-08 DIAGNOSIS — J90 Pleural effusion, not elsewhere classified: Secondary | ICD-10-CM | POA: Diagnosis not present

## 2022-08-08 DIAGNOSIS — T8243XA Leakage of vascular dialysis catheter, initial encounter: Secondary | ICD-10-CM | POA: Diagnosis not present

## 2022-08-08 DIAGNOSIS — F17210 Nicotine dependence, cigarettes, uncomplicated: Secondary | ICD-10-CM

## 2022-08-08 DIAGNOSIS — I5022 Chronic systolic (congestive) heart failure: Secondary | ICD-10-CM | POA: Insufficient documentation

## 2022-08-08 DIAGNOSIS — T8241XA Breakdown (mechanical) of vascular dialysis catheter, initial encounter: Secondary | ICD-10-CM | POA: Insufficient documentation

## 2022-08-08 HISTORY — PX: EXCHANGE OF A DIALYSIS CATHETER: SHX5818

## 2022-08-08 LAB — POCT I-STAT, CHEM 8
BUN: 33 mg/dL — ABNORMAL HIGH (ref 6–20)
Calcium, Ion: 1 mmol/L — ABNORMAL LOW (ref 1.15–1.40)
Chloride: 103 mmol/L (ref 98–111)
Creatinine, Ser: 3.2 mg/dL — ABNORMAL HIGH (ref 0.44–1.00)
Glucose, Bld: 135 mg/dL — ABNORMAL HIGH (ref 70–99)
HCT: 28 % — ABNORMAL LOW (ref 36.0–46.0)
Hemoglobin: 9.5 g/dL — ABNORMAL LOW (ref 12.0–15.0)
Potassium: 4.1 mmol/L (ref 3.5–5.1)
Sodium: 137 mmol/L (ref 135–145)
TCO2: 27 mmol/L (ref 22–32)

## 2022-08-08 LAB — GLUCOSE, CAPILLARY
Glucose-Capillary: 129 mg/dL — ABNORMAL HIGH (ref 70–99)
Glucose-Capillary: 123 mg/dL — ABNORMAL HIGH (ref 70–99)

## 2022-08-08 SURGERY — EXCHANGE OF A DIALYSIS CATHETER
Anesthesia: General | Site: Chest

## 2022-08-08 MED ORDER — CHLORHEXIDINE GLUCONATE 0.12 % MT SOLN
OROMUCOSAL | Status: AC
  Administered 2022-08-08: 15 mL via OROMUCOSAL
  Filled 2022-08-08: qty 15

## 2022-08-08 MED ORDER — HEPARIN SODIUM (PORCINE) 1000 UNIT/ML IJ SOLN
INTRAMUSCULAR | Status: AC
  Filled 2022-08-08: qty 10

## 2022-08-08 MED ORDER — FENTANYL CITRATE (PF) 250 MCG/5ML IJ SOLN
INTRAMUSCULAR | Status: DC | PRN
  Administered 2022-08-08: 25 ug via INTRAVENOUS

## 2022-08-08 MED ORDER — PHENYLEPHRINE HCL-NACL 20-0.9 MG/250ML-% IV SOLN
INTRAVENOUS | Status: DC | PRN
  Administered 2022-08-08: 30 ug/min via INTRAVENOUS

## 2022-08-08 MED ORDER — SODIUM CHLORIDE 0.9 % IV SOLN: INTRAVENOUS | Status: DC

## 2022-08-08 MED ORDER — CHLORHEXIDINE GLUCONATE 4 % EX LIQD: 60.0000 mL | Freq: Once | CUTANEOUS | Status: DC

## 2022-08-08 MED ORDER — HEPARIN 6000 UNIT IRRIGATION SOLUTION
Status: AC
  Filled 2022-08-08: qty 500

## 2022-08-08 MED ORDER — ACETAMINOPHEN 500 MG PO TABS
1000.0000 mg | ORAL_TABLET | ORAL | Status: AC
  Administered 2022-08-08: 1000 mg via ORAL
  Filled 2022-08-08: qty 2

## 2022-08-08 MED ORDER — FENTANYL CITRATE (PF) 250 MCG/5ML IJ SOLN
INTRAMUSCULAR | Status: AC
  Filled 2022-08-08: qty 5

## 2022-08-08 MED ORDER — VANCOMYCIN HCL IN DEXTROSE 1-5 GM/200ML-% IV SOLN: 1000.0000 mg | INTRAVENOUS | Status: AC

## 2022-08-08 MED ORDER — HEPARIN SODIUM (PORCINE) 1000 UNIT/ML IJ SOLN
INTRAMUSCULAR | Status: DC | PRN
  Administered 2022-08-08: 3800 [IU] via INTRAVENOUS

## 2022-08-08 MED ORDER — CHLORHEXIDINE GLUCONATE 0.12 % MT SOLN
15.0000 mL | OROMUCOSAL | Status: AC
  Filled 2022-08-08: qty 15

## 2022-08-08 MED ORDER — VANCOMYCIN HCL IN DEXTROSE 1-5 GM/200ML-% IV SOLN
INTRAVENOUS | Status: AC
  Administered 2022-08-08: 1000 mg via INTRAVENOUS
  Filled 2022-08-08: qty 200

## 2022-08-08 MED ORDER — LIDOCAINE 2% (20 MG/ML) 5 ML SYRINGE
INTRAMUSCULAR | Status: DC | PRN
  Administered 2022-08-08: 50 mg via INTRAVENOUS

## 2022-08-08 MED ORDER — ONDANSETRON HCL 4 MG/2ML IJ SOLN
INTRAMUSCULAR | Status: DC | PRN
  Administered 2022-08-08: 4 mg via INTRAVENOUS

## 2022-08-08 MED ORDER — PHENYLEPHRINE 80 MCG/ML (10ML) SYRINGE FOR IV PUSH (FOR BLOOD PRESSURE SUPPORT)
PREFILLED_SYRINGE | INTRAVENOUS | Status: DC | PRN
  Administered 2022-08-08: 80 ug via INTRAVENOUS

## 2022-08-08 MED ORDER — LIDOCAINE-EPINEPHRINE (PF) 1 %-1:200000 IJ SOLN
INTRAMUSCULAR | Status: AC
  Filled 2022-08-08: qty 30

## 2022-08-08 MED ORDER — DEXAMETHASONE SODIUM PHOSPHATE 10 MG/ML IJ SOLN
INTRAMUSCULAR | Status: DC | PRN
  Administered 2022-08-08: 4 mg via INTRAVENOUS

## 2022-08-08 MED ORDER — PROPOFOL 10 MG/ML IV BOLUS
INTRAVENOUS | Status: AC
  Filled 2022-08-08: qty 20

## 2022-08-08 MED ORDER — METOPROLOL TARTRATE 5 MG/5ML IV SOLN
INTRAVENOUS | Status: DC | PRN
  Administered 2022-08-08: 1 mg via INTRAVENOUS

## 2022-08-08 MED ORDER — PROPOFOL 10 MG/ML IV BOLUS
INTRAVENOUS | Status: DC | PRN
  Administered 2022-08-08: 80 mg via INTRAVENOUS

## 2022-08-08 MED ORDER — MIDAZOLAM HCL 2 MG/2ML IJ SOLN
INTRAMUSCULAR | Status: AC
  Filled 2022-08-08: qty 2

## 2022-08-08 MED ORDER — HEPARIN 6000 UNIT IRRIGATION SOLUTION
Status: DC | PRN
  Administered 2022-08-08: 1

## 2022-08-08 SURGICAL SUPPLY — 45 items
ADH SKN CLS APL DERMABOND .7 (GAUZE/BANDAGES/DRESSINGS) ×1
BAG COUNTER SPONGE SURGICOUNT (BAG) ×2 IMPLANT
BAG DECANTER FOR FLEXI CONT (MISCELLANEOUS) ×2 IMPLANT
BAG SPNG CNTER NS LX DISP (BAG) ×1
BIOPATCH RED 1 DISK 7.0 (GAUZE/BANDAGES/DRESSINGS) ×2 IMPLANT
CATH PALINDROME-P 19CM W/VT (CATHETERS) IMPLANT
CATH PALINDROME-P 23CM W/VT (CATHETERS) IMPLANT
CATH PALINDROME-P 28CM W/VT (CATHETERS) IMPLANT
COVER PROBE W GEL 5X96 (DRAPES) ×2 IMPLANT
COVER SURGICAL LIGHT HANDLE (MISCELLANEOUS) ×2 IMPLANT
DERMABOND ADVANCED .7 DNX12 (GAUZE/BANDAGES/DRESSINGS) ×2 IMPLANT
DRAPE C-ARM 42X72 X-RAY (DRAPES) ×2 IMPLANT
DRAPE CHEST BREAST 15X10 FENES (DRAPES) ×2 IMPLANT
DRSG COVADERM 4X14 (GAUZE/BANDAGES/DRESSINGS) IMPLANT
DRSG TEGADERM 4X4.5 CHG (GAUZE/BANDAGES/DRESSINGS) IMPLANT
GAUZE 4X4 16PLY ~~LOC~~+RFID DBL (SPONGE) ×2 IMPLANT
GAUZE SPONGE 2X2 8PLY NS (GAUZE/BANDAGES/DRESSINGS) IMPLANT
GAUZE SPONGE 2X2 8PLY STRL LF (GAUZE/BANDAGES/DRESSINGS) IMPLANT
GLOVE SURG SS PI 7.5 STRL IVOR (GLOVE) ×6 IMPLANT
GOWN STRL REUS W/ TWL LRG LVL3 (GOWN DISPOSABLE) ×4 IMPLANT
GOWN STRL REUS W/ TWL XL LVL3 (GOWN DISPOSABLE) ×2 IMPLANT
GOWN STRL REUS W/TWL LRG LVL3 (GOWN DISPOSABLE) ×2
GOWN STRL REUS W/TWL XL LVL3 (GOWN DISPOSABLE) ×1
KIT BASIN OR (CUSTOM PROCEDURE TRAY) ×2 IMPLANT
KIT PALINDROME-P 55CM (CATHETERS) IMPLANT
KIT TURNOVER KIT B (KITS) ×2 IMPLANT
NDL 18GX1X1/2 (RX/OR ONLY) (NEEDLE) ×2 IMPLANT
NDL HYPO 25GX1X1/2 BEV (NEEDLE) ×2 IMPLANT
NEEDLE 18GX1X1/2 (RX/OR ONLY) (NEEDLE) ×1 IMPLANT
NEEDLE HYPO 25GX1X1/2 BEV (NEEDLE) ×1 IMPLANT
NS IRRIG 1000ML POUR BTL (IV SOLUTION) ×2 IMPLANT
PACK BASIC III (CUSTOM PROCEDURE TRAY) ×1
PACK SRG BSC III STRL LF ECLPS (CUSTOM PROCEDURE TRAY) ×2 IMPLANT
PAD ARMBOARD 7.5X6 YLW CONV (MISCELLANEOUS) ×4 IMPLANT
SOAP 2 % CHG 4 OZ (WOUND CARE) ×2 IMPLANT
SUT ETHILON 3 0 PS 1 (SUTURE) ×2 IMPLANT
SUT VICRYL 4-0 PS2 18IN ABS (SUTURE) ×2 IMPLANT
SYR 10ML LL (SYRINGE) ×2 IMPLANT
SYR 20ML LL LF (SYRINGE) ×4 IMPLANT
SYR 30ML LL (SYRINGE) IMPLANT
SYR 5ML LL (SYRINGE) ×2 IMPLANT
SYR CONTROL 10ML LL (SYRINGE) ×2 IMPLANT
TOWEL GREEN STERILE (TOWEL DISPOSABLE) ×2 IMPLANT
WATER STERILE IRR 1000ML POUR (IV SOLUTION) ×2 IMPLANT
WIRE BENTSON .035X145CM (WIRE) IMPLANT

## 2022-08-08 NOTE — Anesthesia Procedure Notes (Signed)
Procedure Name: Intubation Date/Time: 08/08/2022 10:46 AM  Performed by: Darletta Moll, CRNAPre-anesthesia Checklist: Patient identified, Emergency Drugs available, Suction available and Patient being monitored Patient Re-evaluated:Patient Re-evaluated prior to induction Oxygen Delivery Method: Circle system utilized Preoxygenation: Pre-oxygenation with 100% oxygen Induction Type: IV induction Ventilation: Mask ventilation without difficulty LMA: LMA inserted LMA Size: 4.0 Number of attempts: 1 Placement Confirmation: positive ETCO2, breath sounds checked- equal and bilateral and CO2 detector Tube secured with: Tape Dental Injury: Teeth and Oropharynx as per pre-operative assessment

## 2022-08-08 NOTE — Transfer of Care (Signed)
Immediate Anesthesia Transfer of Care Note  Patient: Amber Cross  Procedure(s) Performed: EXCHANGE OF A TUNNELED DIALYSIS CATHETER (Chest)  Patient Location: PACU  Anesthesia Type:General  Level of Consciousness: drowsy and patient cooperative  Airway & Oxygen Therapy: Patient Spontanous Breathing  Post-op Assessment: Report given to RN, Post -op Vital signs reviewed and stable, and Patient moving all extremities X 4  Post vital signs: Reviewed and stable  Last Vitals:  Vitals Value Taken Time  BP 151/71 08/08/22 1130  Temp 36.2 C 08/08/22 1130  Pulse 86 08/08/22 1133  Resp 14 08/08/22 1133  SpO2 94 % 08/08/22 1133  Vitals shown include unvalidated device data.  Last Pain:  Vitals:   08/08/22 0823  TempSrc:   PainSc: 0-No pain      Patients Stated Pain Goal: 0 (08/81/10 3159)  Complications: No notable events documented.

## 2022-08-08 NOTE — Op Note (Signed)
    Patient name: Amber Cross MRN: [id number] DOB: 12/08/1960 Sex: female  08/08/2022 Pre-operative Diagnosis: End-stage renal disease Post-operative diagnosis:  Same Surgeon:  Annamarie Major Assistants:  none Procedure:   #1: Removal of right sided dialysis catheter   #2: Placement of a new right internal jugular vein dialysis catheter under fluoroscopic visualization (23 cm) Anesthesia:  Genera  Blood Loss:  minimal Specimens:  none  Findings: Catheter tip at cavoatrial junction  Indications: This is a 62 year old female who has a leaking dialysis catheter.  She is here for exchange.  Procedure:  The patient was identified in the holding area and taken to [place] 16  The patient was then placed supine on the table. general anesthesia was administered.  The patient was prepped and draped in the usual sterile fashion.  A time out was called and antibiotics were administered.  A skin nick was made above the clavicle over top of the existing catheter.  Sharp dissection was used to circumferentially expose the catheter which was clamped with a hemostat.  I then dissected out the cuff at the skin exit site.  The catheter was then cut proximal to the cuff and delivered into the neck incision.  A Bentson wire was then inserted and the previous catheter was removed.  A peel-away sheath was then placed.  A 23 cm catheter was then selected.  A skin nick was made below the clavicle.  A tunneler was used to connect the 2 incisions and the catheter was brought through the tunnel with the cuff situated at the skin exit site.  The catheter was then fed into the peel-away sheath which was then removed.  Fluoroscopy confirmed that the catheter tip was at the cavoatrial junction and there were no kinks.  Both ports flushed and aspirated without difficulty.  The catheter was sutured into position with 3-0 nylon.  The neck incision was closed with 4-0 Vicryl and Dermabond.  The catheter was filled with the  appropriate volumes of heparin.  There were no complications.   Disposition: To PACU stable.   Theotis Burrow, M.D., [facility] [place] Office: [phone] Pager:  [phone]

## 2022-08-08 NOTE — Anesthesia Postprocedure Evaluation (Signed)
Anesthesia Post Note  Patient: Amber Cross  Procedure(s) Performed: EXCHANGE OF A TUNNELED DIALYSIS CATHETER (Chest)     Patient location during evaluation: PACU Anesthesia Type: General Level of consciousness: sedated and patient cooperative Pain management: pain level controlled Vital Signs Assessment: post-procedure vital signs reviewed and stable Respiratory status: spontaneous breathing Cardiovascular status: stable Anesthetic complications: no   No notable events documented.  Last Vitals:  Vitals:   08/08/22 1130 08/08/22 1144  BP: (!) 151/71 123/76  Pulse: 84   Resp: 20   Temp: (!) 36.2 C   SpO2: 90% 96%    Last Pain:  Vitals:   08/08/22 0823  TempSrc:   PainSc: 0-No pain                 Nolon Nations

## 2022-08-08 NOTE — H&P (Signed)
   Patient name: Amber Cross MRN: [id number] DOB: [birth year] Sex: female   HISTORY OF PRESENT ILLNESS:   Amber Cross is a 62 y.o. female with poorly functioning catheter that needs to be changed  CURRENT MEDICATIONS:    Current Facility-Administered Medications  Medication Dose Route Frequency Provider Last Rate Last Admin   0.9 %  sodium chloride infusion   Intravenous Continuous Serafina Mitchell, MD 10 mL/hr at 08/08/22 0914 Continued from Pre-op at 08/08/22 0914   chlorhexidine (HIBICLENS) 4 % liquid 4 Application  60 mL Topical Once Serafina Mitchell, MD       And   chlorhexidine (HIBICLENS) 4 % liquid 4 Application  60 mL Topical Once Serafina Mitchell, MD       vancomycin (VANCOCIN) IVPB 1000 mg/200 mL premix  1,000 mg Intravenous 60 min Pre-Op Serafina Mitchell, MD 200 mL/hr at 08/08/22 0841 1,000 mg at 08/08/22 0841    REVIEW OF SYSTEMS:   [X]  denotes positive finding, [ ]  denotes negative finding Cardiac  Comments:  Chest pain or chest pressure:    Shortness of breath upon exertion:    Short of breath when lying flat:    Irregular heart rhythm:    Constitutional    Fever or chills:      PHYSICAL EXAM:   Vitals:   08/07/22 0758 08/08/22 0807  BP:  (!) 168/77  Pulse:  93  Resp:  18  Temp:  98.1 F (36.7 C)  TempSrc:  Oral  SpO2:  98%  Weight: 49.4 kg 49.4 kg  Height: 5\' 7"  (1.702 m) 5\' 7"  (1.702 m)    GENERAL: The patient is a well-nourished female, in no acute distress. The vital signs are documented above. CARDIOVASCULAR: There is a regular rate and rhythm. PULMONARY: Non-labored respirations   STUDIES:      MEDICAL ISSUES:   Plan for new TDC.  All questions answered  Leia Alf, MD, FACS [facility] [phone] Pager [phone]

## 2022-08-09 ENCOUNTER — Encounter (HOSPITAL_COMMUNITY): Payer: Self-pay | Admitting: Surgery

## 2022-08-09 DIAGNOSIS — Z992 Dependence on renal dialysis: Secondary | ICD-10-CM | POA: Diagnosis not present

## 2022-08-09 DIAGNOSIS — N186 End stage renal disease: Secondary | ICD-10-CM | POA: Diagnosis not present

## 2022-08-09 DIAGNOSIS — N2581 Secondary hyperparathyroidism of renal origin: Secondary | ICD-10-CM | POA: Diagnosis not present

## 2022-08-12 ENCOUNTER — Ambulatory Visit: Payer: Self-pay | Admitting: Cardiothoracic Surgery

## 2022-08-14 ENCOUNTER — Telehealth: Payer: Self-pay | Admitting: Licensed Clinical Social Worker

## 2022-08-14 NOTE — Telephone Encounter (Signed)
CSW contacted patient to review transportation waiver via phone and inform patient that she will sign waiver upon arrival at the office. Transportation set up by [employer] for taxi transport. Lasandra Beech, LCSW, CCSW-MCS [phone]    08/14/2022  Amber Cross DOB: 05-28-1961 MRN: [id number]   RIDER WAIVER AND RELEASE OF LIABILITY  For the purposes of helping with transportation needs, [facility] partners with outside transportation providers (taxi companies, [employer], Catering manager.) to give [employer] patients or other approved people the choice of on-demand rides [employer]") to our buildings for non-emergency visits.  By using [employer], I, the person signing this document, on behalf of myself and/or any legal minors (in my care using the [employer]), agree:  Science writer given to me are supplied by independent, outside transportation providers who do not work for, or have any affiliation with, [employer]. [facility] is not a transportation company. [facility] has no control over the quality or safety of the rides I get using [employer]. [facility] has no control over whether any outside ride will happen on time or not. [facility] gives no guarantee on the reliability, quality, safety, or availability on any rides, or that no mistakes will happen. I know and accept that traveling by vehicle (car, truck, SVU, Zenaida Niece, bus, taxi, etc.) has risks of serious injuries such as disability, being paralyzed, and death. I know and agree the risk of using [employer] is mine alone, and not [employer]. Transport Services are provided "as is" and as are available. The transportation providers are in charge for all inspections and care of the vehicles used to provide these rides. I agree not to take legal action against [facility], its agents, employees, officers, directors, representatives, insurers, attorneys, assigns, successors, subsidiaries, and  affiliates at any time for any reasons related directly or indirectly to using [employer]. I also agree not to take legal action against [employer] or its affiliates for any injury, death, or damage to property caused by or related to using [employer]. I have read this Waiver and Release of Liability, and I understand the terms used in it and their legal meaning. This Waiver is freely and voluntarily given with the understanding that my right (or any legal minors) to legal action against  relating to [employer] is knowingly given up to use these services.   I attest that I read the Ride Waiver and Release of Liability to Amber Cross, gave Ms. Umali the opportunity to ask questions and answered the questions asked (if any). I affirm that Amber Cross then provided consent for assistance with transportation.     Marcy Siren, LCSW, CCSW-MCS [phone]

## 2022-08-15 ENCOUNTER — Ambulatory Visit (HOSPITAL_COMMUNITY): Payer: Medicaid Other

## 2022-08-16 ENCOUNTER — Other Ambulatory Visit: Payer: Self-pay | Admitting: Cardiothoracic Surgery

## 2022-08-16 ENCOUNTER — Other Ambulatory Visit: Payer: Self-pay | Admitting: Radiation Therapy

## 2022-08-16 DIAGNOSIS — C3490 Malignant neoplasm of unspecified part of unspecified bronchus or lung: Secondary | ICD-10-CM

## 2022-08-16 DIAGNOSIS — Z9889 Other specified postprocedural states: Secondary | ICD-10-CM

## 2022-08-16 DIAGNOSIS — Z952 Presence of prosthetic heart valve: Secondary | ICD-10-CM

## 2022-08-19 ENCOUNTER — Ambulatory Visit: Payer: Self-pay | Admitting: Cardiothoracic Surgery

## 2022-08-19 DIAGNOSIS — N186 End stage renal disease: Secondary | ICD-10-CM | POA: Diagnosis not present

## 2022-08-19 DIAGNOSIS — N2581 Secondary hyperparathyroidism of renal origin: Secondary | ICD-10-CM | POA: Diagnosis not present

## 2022-08-19 DIAGNOSIS — Z992 Dependence on renal dialysis: Secondary | ICD-10-CM | POA: Diagnosis not present

## 2022-08-21 ENCOUNTER — Telehealth (HOSPITAL_COMMUNITY): Payer: Self-pay | Admitting: Licensed Clinical Social Worker

## 2022-08-21 DIAGNOSIS — J9690 Respiratory failure, unspecified, unspecified whether with hypoxia or hypercapnia: Secondary | ICD-10-CM | POA: Diagnosis not present

## 2022-08-21 DIAGNOSIS — Z992 Dependence on renal dialysis: Secondary | ICD-10-CM | POA: Diagnosis not present

## 2022-08-21 DIAGNOSIS — D72829 Elevated white blood cell count, unspecified: Secondary | ICD-10-CM | POA: Diagnosis not present

## 2022-08-21 DIAGNOSIS — R0902 Hypoxemia: Secondary | ICD-10-CM | POA: Diagnosis not present

## 2022-08-21 DIAGNOSIS — J811 Chronic pulmonary edema: Secondary | ICD-10-CM | POA: Diagnosis not present

## 2022-08-21 DIAGNOSIS — E1122 Type 2 diabetes mellitus with diabetic chronic kidney disease: Secondary | ICD-10-CM | POA: Diagnosis not present

## 2022-08-21 DIAGNOSIS — E1165 Type 2 diabetes mellitus with hyperglycemia: Secondary | ICD-10-CM | POA: Diagnosis not present

## 2022-08-21 DIAGNOSIS — I10 Essential (primary) hypertension: Secondary | ICD-10-CM | POA: Diagnosis not present

## 2022-08-21 DIAGNOSIS — R000 Tachycardia, unspecified: Secondary | ICD-10-CM | POA: Diagnosis not present

## 2022-08-21 DIAGNOSIS — N186 End stage renal disease: Secondary | ICD-10-CM | POA: Diagnosis not present

## 2022-08-21 DIAGNOSIS — Z781 Physical restraint status: Secondary | ICD-10-CM | POA: Diagnosis not present

## 2022-08-21 DIAGNOSIS — G8929 Other chronic pain: Secondary | ICD-10-CM | POA: Diagnosis not present

## 2022-08-21 DIAGNOSIS — Z87891 Personal history of nicotine dependence: Secondary | ICD-10-CM | POA: Diagnosis not present

## 2022-08-21 DIAGNOSIS — Z794 Long term (current) use of insulin: Secondary | ICD-10-CM | POA: Diagnosis not present

## 2022-08-21 DIAGNOSIS — I5033 Acute on chronic diastolic (congestive) heart failure: Secondary | ICD-10-CM | POA: Diagnosis not present

## 2022-08-21 DIAGNOSIS — J9811 Atelectasis: Secondary | ICD-10-CM | POA: Diagnosis not present

## 2022-08-21 DIAGNOSIS — Z5982 Transportation insecurity: Secondary | ICD-10-CM | POA: Diagnosis not present

## 2022-08-21 DIAGNOSIS — I5032 Chronic diastolic (congestive) heart failure: Secondary | ICD-10-CM | POA: Diagnosis not present

## 2022-08-21 DIAGNOSIS — I161 Hypertensive emergency: Secondary | ICD-10-CM | POA: Diagnosis not present

## 2022-08-21 DIAGNOSIS — I132 Hypertensive heart and chronic kidney disease with heart failure and with stage 5 chronic kidney disease, or end stage renal disease: Secondary | ICD-10-CM | POA: Diagnosis not present

## 2022-08-21 DIAGNOSIS — R404 Transient alteration of awareness: Secondary | ICD-10-CM | POA: Diagnosis not present

## 2022-08-21 DIAGNOSIS — E785 Hyperlipidemia, unspecified: Secondary | ICD-10-CM | POA: Diagnosis not present

## 2022-08-21 DIAGNOSIS — R0603 Acute respiratory distress: Secondary | ICD-10-CM | POA: Insufficient documentation

## 2022-08-21 DIAGNOSIS — J9601 Acute respiratory failure with hypoxia: Secondary | ICD-10-CM | POA: Diagnosis not present

## 2022-08-21 DIAGNOSIS — I160 Hypertensive urgency: Secondary | ICD-10-CM | POA: Diagnosis not present

## 2022-08-21 DIAGNOSIS — J9621 Acute and chronic respiratory failure with hypoxia: Secondary | ICD-10-CM | POA: Diagnosis not present

## 2022-08-21 DIAGNOSIS — Z20822 Contact with and (suspected) exposure to covid-19: Secondary | ICD-10-CM | POA: Diagnosis not present

## 2022-08-21 DIAGNOSIS — J90 Pleural effusion, not elsewhere classified: Secondary | ICD-10-CM | POA: Diagnosis not present

## 2022-08-21 DIAGNOSIS — R069 Unspecified abnormalities of breathing: Secondary | ICD-10-CM | POA: Diagnosis not present

## 2022-08-21 DIAGNOSIS — E1140 Type 2 diabetes mellitus with diabetic neuropathy, unspecified: Secondary | ICD-10-CM | POA: Diagnosis not present

## 2022-08-21 DIAGNOSIS — R778 Other specified abnormalities of plasma proteins: Secondary | ICD-10-CM | POA: Diagnosis not present

## 2022-08-21 NOTE — Telephone Encounter (Signed)
CSW attempted to reach patient to discuss transportation options to assist with future needs to appointments. CSW left message for return call. Raquel Sarna, [place], [place]

## 2022-08-23 DIAGNOSIS — N186 End stage renal disease: Secondary | ICD-10-CM | POA: Diagnosis not present

## 2022-08-23 DIAGNOSIS — J9621 Acute and chronic respiratory failure with hypoxia: Secondary | ICD-10-CM | POA: Diagnosis not present

## 2022-08-23 DIAGNOSIS — I5033 Acute on chronic diastolic (congestive) heart failure: Secondary | ICD-10-CM | POA: Diagnosis not present

## 2022-08-23 DIAGNOSIS — I161 Hypertensive emergency: Secondary | ICD-10-CM | POA: Diagnosis not present

## 2022-08-23 DIAGNOSIS — E1122 Type 2 diabetes mellitus with diabetic chronic kidney disease: Secondary | ICD-10-CM | POA: Diagnosis not present

## 2022-08-23 DIAGNOSIS — J90 Pleural effusion, not elsewhere classified: Secondary | ICD-10-CM | POA: Diagnosis not present

## 2022-08-23 DIAGNOSIS — Z992 Dependence on renal dialysis: Secondary | ICD-10-CM | POA: Diagnosis not present

## 2022-08-23 DIAGNOSIS — J811 Chronic pulmonary edema: Secondary | ICD-10-CM | POA: Diagnosis not present

## 2022-08-23 DIAGNOSIS — I132 Hypertensive heart and chronic kidney disease with heart failure and with stage 5 chronic kidney disease, or end stage renal disease: Secondary | ICD-10-CM | POA: Diagnosis not present

## 2022-08-26 ENCOUNTER — Telehealth: Payer: Self-pay

## 2022-08-26 NOTE — Telephone Encounter (Signed)
Transition Care Management Unsuccessful Follow-up Telephone Call  Date of discharge and from where:  [facility] 08/23/2022  Attempts:  1st Attempt  Reason for unsuccessful TCM follow-up call:  Left voice message Juanda Crumble, [place] Direct Dial [phone]

## 2022-08-27 ENCOUNTER — Telehealth: Payer: Self-pay | Admitting: Licensed Clinical Social Worker

## 2022-08-27 NOTE — Telephone Encounter (Signed)
CSW attempted again to reach patient regarding transportation needs. CSW left message for return call. Raquel Sarna, [place], [place]

## 2022-08-28 ENCOUNTER — Telehealth: Payer: Self-pay | Admitting: Radiation Therapy

## 2022-08-28 DIAGNOSIS — I129 Hypertensive chronic kidney disease with stage 1 through stage 4 chronic kidney disease, or unspecified chronic kidney disease: Secondary | ICD-10-CM | POA: Diagnosis not present

## 2022-08-28 DIAGNOSIS — N186 End stage renal disease: Secondary | ICD-10-CM | POA: Diagnosis not present

## 2022-08-28 DIAGNOSIS — Z992 Dependence on renal dialysis: Secondary | ICD-10-CM | POA: Diagnosis not present

## 2022-08-28 NOTE — Telephone Encounter (Signed)
Left a detailed voicemail about Ms. Amber Cross's upcoming brain MRI and telephone follow-up with Ashlyn in March. My contact information was included in case she has questions or a conflict.   Mont Dutton R.T. (R)(T) Radiation Special Procedures Navigator

## 2022-08-28 NOTE — Telephone Encounter (Signed)
Transition Care Management Unsuccessful Follow-up Telephone Call  Date of discharge and from where:  [facility] 08/23/2022  Attempts:  2nd Attempt  Reason for unsuccessful TCM follow-up call:  Left voice message Juanda Crumble, [place] Direct Dial [phone]

## 2022-08-29 ENCOUNTER — Encounter: Payer: Self-pay | Admitting: Physician Assistant

## 2022-08-29 ENCOUNTER — Ambulatory Visit (INDEPENDENT_AMBULATORY_CARE_PROVIDER_SITE_OTHER): Payer: 59 | Admitting: Physician Assistant

## 2022-08-29 ENCOUNTER — Ambulatory Visit (HOSPITAL_COMMUNITY)
Admission: RE | Admit: 2022-08-29 | Discharge: 2022-08-29 | Disposition: A | Discharge status: Home / Self Care | Payer: 59 | Source: Ambulatory Visit | Attending: Physician Assistant | Admitting: Physician Assistant

## 2022-08-29 VITALS — BP 129/56 | HR 78 | Temp 98.2°F | Resp 20 | Ht 67.0 in | Wt 109.8 lb

## 2022-08-29 DIAGNOSIS — N186 End stage renal disease: Secondary | ICD-10-CM | POA: Insufficient documentation

## 2022-08-29 NOTE — Progress Notes (Addendum)
Postoperative Access Visit   History of Present Illness   Amber Cross is a 62 y.o. year old female who presents for postoperative follow-up for: left brachiocephalic AV fistula by Dr. Scot Dock on 07/01/22. She also just had exchange of TDC on 08/08/22 by Dr. Trula Slade due to her dialysis catheter leaking. The patient's wounds are well healed. The patient notes no steal symptoms.  Her current [facility] is functioning well.  She is currently dialyzing via [facility] at the [facility] on MWF  Physical Examination   Vitals:   08/29/22 1423  BP: (!) 129/56  Pulse: 78  Resp: 20  Temp: 98.2 F (36.8 C)  TempSrc: Temporal  SpO2: 92%  Weight: 109 lb 12.8 oz (49.8 kg)  Height: 5\' 7"  (1.702 m)   Body mass index is 17.2 kg/m.  left arm Incision is well healed, 2+ radial pulse, hand grip is 5/5, sensation in digits is intact, palpable thrill, bruit can be auscultated    Non invasive vascular lab: VAS US Duplex Dialysis Access:  Findings: +--------------------+----------+-----------------+--------+ AVF                 PSV (cm/s)Flow Vol (mL/min)Comments +--------------------+----------+-----------------+--------+ Native artery inflow   191           823                +--------------------+----------+-----------------+--------+ AVF Anastomosis        222                              +--------------------+----------+-----------------+--------+  +------------+----------+-------------+----------+--------+ OUTFLOW VEINPSV (cm/s)Diameter (cm)Depth (cm)Describe +------------+----------+-------------+----------+--------+ Prox UA        108        0.45        0.37            +------------+----------+-------------+----------+--------+ Mid UA          69        0.57        0.18            +------------+----------+-------------+----------+--------+ Dist UA        106        0.68        0.15             +------------+----------+-------------+----------+--------+ AC Fossa       230        0.51        0.23            +------------+----------+-------------+----------+--------+  Summary: Patent arteriovenous fistula.    Medical Decision Making   Amber Cross is a 62 y.o. year old female who presents s/p left brachiocephalic AV fistula by Dr. Scot Dock on 07/01/22. She also just had exchange of TDC on 08/08/22 by Dr. Trula Slade due to her dialysis catheter leaking. Her incision is well healed. Her fistula is functioning well. Easily palpable in the left upper arm. Duplex today shows patent fistula that has matured adequately and is of adequate depth. Her TDC is also functioning well without any leaks Patent is without signs or symptoms of steal syndrome The patient's access will be ready for use after 09/30/22 The patient's tunneled dialysis catheter can be removed when Nephrology is comfortable with the performance of the left AV fistula. TDC was placed by [facility] so we can be contacted at the time for removal The patient may follow up on a prn basis   Amber Ruggerio, PA-C  [facility] Office: [phone]  Clinic MD: Scot Dock

## 2022-08-30 DIAGNOSIS — Z992 Dependence on renal dialysis: Secondary | ICD-10-CM | POA: Diagnosis not present

## 2022-08-30 DIAGNOSIS — N186 End stage renal disease: Secondary | ICD-10-CM | POA: Diagnosis not present

## 2022-08-30 DIAGNOSIS — N2581 Secondary hyperparathyroidism of renal origin: Secondary | ICD-10-CM | POA: Diagnosis not present

## 2022-09-02 DIAGNOSIS — N186 End stage renal disease: Secondary | ICD-10-CM | POA: Diagnosis not present

## 2022-09-02 DIAGNOSIS — N2581 Secondary hyperparathyroidism of renal origin: Secondary | ICD-10-CM | POA: Diagnosis not present

## 2022-09-02 DIAGNOSIS — Z992 Dependence on renal dialysis: Secondary | ICD-10-CM | POA: Diagnosis not present

## 2022-09-04 DIAGNOSIS — Z992 Dependence on renal dialysis: Secondary | ICD-10-CM | POA: Diagnosis not present

## 2022-09-04 DIAGNOSIS — N2581 Secondary hyperparathyroidism of renal origin: Secondary | ICD-10-CM | POA: Diagnosis not present

## 2022-09-04 DIAGNOSIS — N186 End stage renal disease: Secondary | ICD-10-CM | POA: Diagnosis not present

## 2022-09-05 ENCOUNTER — Ambulatory Visit (INDEPENDENT_AMBULATORY_CARE_PROVIDER_SITE_OTHER): Payer: Medicare Other | Admitting: Student

## 2022-09-05 ENCOUNTER — Encounter: Payer: Self-pay | Admitting: Student

## 2022-09-05 VITALS — BP 133/53 | HR 75 | Temp 97.9°F | Ht 67.5 in | Wt 108.6 lb

## 2022-09-05 DIAGNOSIS — E1122 Type 2 diabetes mellitus with diabetic chronic kidney disease: Secondary | ICD-10-CM | POA: Diagnosis not present

## 2022-09-05 DIAGNOSIS — Z992 Dependence on renal dialysis: Secondary | ICD-10-CM

## 2022-09-05 DIAGNOSIS — N186 End stage renal disease: Secondary | ICD-10-CM

## 2022-09-05 DIAGNOSIS — I5022 Chronic systolic (congestive) heart failure: Secondary | ICD-10-CM

## 2022-09-05 DIAGNOSIS — E1142 Type 2 diabetes mellitus with diabetic polyneuropathy: Secondary | ICD-10-CM

## 2022-09-05 DIAGNOSIS — Z794 Long term (current) use of insulin: Secondary | ICD-10-CM | POA: Diagnosis not present

## 2022-09-05 DIAGNOSIS — I132 Hypertensive heart and chronic kidney disease with heart failure and with stage 5 chronic kidney disease, or end stage renal disease: Secondary | ICD-10-CM

## 2022-09-05 DIAGNOSIS — E785 Hyperlipidemia, unspecified: Secondary | ICD-10-CM

## 2022-09-05 DIAGNOSIS — F17210 Nicotine dependence, cigarettes, uncomplicated: Secondary | ICD-10-CM

## 2022-09-05 DIAGNOSIS — I10 Essential (primary) hypertension: Secondary | ICD-10-CM

## 2022-09-05 DIAGNOSIS — Z720 Tobacco use: Secondary | ICD-10-CM

## 2022-09-05 MED ORDER — ATORVASTATIN CALCIUM 80 MG PO TABS: 80.0000 mg | ORAL_TABLET | Freq: Every day | ORAL | 3 refills | Status: DC

## 2022-09-05 MED ORDER — CARVEDILOL 12.5 MG PO TABS: 12.5000 mg | ORAL_TABLET | Freq: Two times a day (BID) | ORAL | 1 refills | Status: DC

## 2022-09-05 MED ORDER — AMLODIPINE BESYLATE 10 MG PO TABS: 10.0000 mg | ORAL_TABLET | Freq: Every day | ORAL | 3 refills | Status: DC

## 2022-09-05 MED ORDER — FUROSEMIDE 40 MG PO TABS: 40.0000 mg | ORAL_TABLET | Freq: Every day | ORAL | 1 refills | Status: AC | PRN

## 2022-09-05 MED ORDER — UNIFINE PENTIPS 32G X 4 MM MISC: 1.0000 [IU] | Freq: Two times a day (BID) | 3 refills | Status: DC

## 2022-09-05 MED ORDER — HYDRALAZINE HCL 50 MG PO TABS: 50.0000 mg | ORAL_TABLET | Freq: Three times a day (TID) | ORAL | 3 refills | Status: DC

## 2022-09-05 MED ORDER — ALBUTEROL SULFATE HFA 108 (90 BASE) MCG/ACT IN AERS: 2.0000 | INHALATION_SPRAY | Freq: Four times a day (QID) | RESPIRATORY_TRACT | 2 refills | Status: DC | PRN

## 2022-09-05 MED ORDER — TRAZODONE HCL 50 MG PO TABS: 50.0000 mg | ORAL_TABLET | Freq: Every day | ORAL | 1 refills | Status: DC

## 2022-09-05 MED ORDER — GABAPENTIN 300 MG PO CAPS: 300.0000 mg | ORAL_CAPSULE | Freq: Every day | ORAL | 2 refills | Status: DC

## 2022-09-05 MED ORDER — NOVOLIN 70/30 FLEXPEN (70-30) 100 UNIT/ML ~~LOC~~ SUPN: 5.0000 [IU] | PEN_INJECTOR | Freq: Two times a day (BID) | SUBCUTANEOUS | 1 refills | Status: DC

## 2022-09-05 NOTE — Assessment & Plan Note (Signed)
A1c 6.7% in November 2023. Currently on novolin 70/30 at 5u BID.  -refilled novolin 70/30 -repeat A1c in 1 month

## 2022-09-05 NOTE — Progress Notes (Signed)
   CC: hospital follow up  HPI:  [name] is a 62 y.o. female with history listed below presenting to the [facility] for hospital f/u. Please see individualized problem based charting for full HPI.  Past Medical History:  Diagnosis Date   Anemia    Arthritis    CHF (congestive heart failure) (HCC)    Chronic kidney disease    dialysis M-W-F   Depression    Diabetes mellitus    Headache    Hypercholesteremia    Hypertension    Lung cancer ([place]) 12/18/2021   RUL small cell carcinoma, s/p radiation   Pseudogout    Stroke ([place]) 10/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   Tobacco abuse     Review of Systems:  Negative aside from that listed in individualized problem based charting.  Physical Exam:  Vitals:   09/05/22 1519  BP: (!) 133/53  Pulse: 75  Temp: 97.9 F (36.6 C)  TempSrc: Oral  SpO2: 98%  Weight: 108 lb 9.6 oz (49.3 kg)  Height: 5' 7.5" (1.715 m)   Physical Exam Constitutional:      Comments: Thin female, appears older than stated age, sitting in chair, NAD.  HENT:     Mouth/Throat:     Mouth: Mucous membranes are moist.     Pharynx: Oropharynx is clear.  Eyes:     Extraocular Movements: Extraocular movements intact.     Conjunctiva/sclera: Conjunctivae normal.  Cardiovascular:     Rate and Rhythm: Normal rate and regular rhythm.     Heart sounds: Normal heart sounds. No murmur heard. Pulmonary:     Effort: Pulmonary effort is normal.     Breath sounds: Normal breath sounds. No wheezing, rhonchi or rales.  Abdominal:     General: Bowel sounds are normal. There is no distension.     Palpations: Abdomen is soft.     Tenderness: There is no abdominal tenderness.  Musculoskeletal:        General: Normal range of motion.  Skin:    General: Skin is warm and dry.  Neurological:     Mental Status: She is alert and oriented to person, place, and time. Mental status is at baseline.  Psychiatric:        Mood and Affect: Mood normal.        Behavior: Behavior normal.       Assessment & Plan:   See Encounters Tab for problem based charting.  Patient discussed with Dr.  Cain Sieve

## 2022-09-05 NOTE — Patient Instructions (Addendum)
Ms. Amber Cross,  It was a pleasure seeing you in the clinic today.   I have refilled all of your medicines as requested. Please make sure to take each one as directed. I am glad that you are doing better after your recent hospitalization. Please make sure to continue going to your dialysis sessions regularly. Please come back in 3 months for a follow up visit.  Please call our clinic at [phone] if you have any questions or concerns. The best time to call is Monday-Friday from 9am-4pm, but there is someone available 24/7 at the same number. If you need medication refills, please notify your pharmacy one week in advance and they will send [place] a request.   Thank you for letting us take part in your care. We look forward to seeing you next time!

## 2022-09-05 NOTE — Progress Notes (Deleted)
CC: hospital f/u visit  HPI:  AmberAmber Cross is a 62 y.o. female with past medical history of HTN, HLD, CVA (11/2021), T2DM, HFrEF, ESRD on HD, small cell lung cancer, and atrophic vaginitis that presents for a hospital f/u visit.    Allergies as of 09/05/2022       Reactions   Penicillins Itching, Other (See Comments)   redness   Strawberry (diagnostic) Itching, Swelling   Tomato Itching, Swelling        Medication List        Accurate as of September 05, 2022  7:28 AM. If you have any questions, ask your nurse or doctor.          acetaminophen 500 MG tablet Commonly known as: TYLENOL Take 1,000 mg by mouth every 6 (six) hours as needed for mild pain.   albuterol 108 (90 Base) MCG/ACT inhaler Commonly known as: VENTOLIN HFA Inhale 2 puffs into the lungs every 6 (six) hours as needed for wheezing or shortness of breath.   amLODipine 10 MG tablet Commonly known as: NORVASC Take 1 tablet (10 mg total) by mouth daily.   aspirin EC 81 MG tablet Take 1 tablet (81 mg total) by mouth daily. Swallow whole.   atorvastatin 80 MG tablet Commonly known as: LIPITOR Take 1 tablet (80 mg total) by mouth daily.   carvedilol 12.5 MG tablet Commonly known as: COREG Take 1 tablet (12.5 mg total) by mouth 2 (two) times daily with a meal.   cephALEXin 500 MG capsule Commonly known as: KEFLEX Take 1 capsule (500 mg total) by mouth 3 (three) times daily.   Dexcom G7 Sensor Misc Apply each device on your skin every 10 days   gabapentin 300 MG capsule Commonly known as: Neurontin Take 1 capsule (300 mg total) by mouth daily.   hydrALAZINE 50 MG tablet Commonly known as: APRESOLINE Take 1 tablet (50 mg total) by mouth every 8 (eight) hours.   hydrOXYzine 25 MG tablet Commonly known as: ATARAX Take 1 tablet (25 mg total) by mouth at bedtime.   isosorbide mononitrate 30 MG 24 hr tablet Commonly known as: IMDUR Take 1 tablet (30 mg total) by mouth daily.    nitroGLYCERIN 0.4 MG SL tablet Commonly known as: NITROSTAT Place 0.4 mg under the tongue every 5 (five) minutes as needed for chest pain.   NovoLIN 70/30 Kwikpen (70-30) 100 UNIT/ML KwikPen Generic drug: insulin isophane & regular human KwikPen Inject 5 Units into the skin 2 (two) times daily.   traZODone 50 MG tablet Commonly known as: DESYREL Take 1 tablet (50 mg total) by mouth at bedtime.   Unifine Pentips 32G X 4 MM Misc Generic drug: Insulin Pen Needle Use in the morning and at bedtime   Vitamin D (Ergocalciferol) 1.25 MG (50000 UNIT) Caps capsule Commonly known as: DRISDOL Take 1 capsule (50,000 Units total) by mouth every 7 (seven) days.         Past Medical History:  Diagnosis Date   Anemia    Arthritis    CHF (congestive heart failure) (HCC)    Chronic kidney disease    dialysis M-W-F   Depression    Diabetes mellitus    Headache    Hypercholesteremia    Hypertension    Lung cancer ([place]) 12/18/2021   RUL small cell carcinoma, s/p radiation   Pseudogout    Stroke ([place]) 10/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   Tobacco abuse    Review of Systems:  per  HPI.   Physical Exam: *** There were no vitals filed for this visit.  *** Constitutional: Well-developed, well-nourished, appears comfortable  HENT: Normocephalic and atraumatic.  Eyes: EOM are normal. PERRL.  Neck: Normal range of motion.  Cardiovascular: Regular rate, regular rhythm. No murmurs, rubs, or gallops. Normal radial and PT pulses bilaterally. No LE edema.  Pulmonary: Normal respiratory effort. No wheezes, rales, or rhonchi.   Abdominal: Soft. Non-distended. No tenderness. Normal bowel sounds.  Musculoskeletal: Normal range of motion.     Neurological: Alert and oriented to person, place, and time. Non-focal. Skin: warm and dry.    Assessment & Plan:   Acute on Chronic hypoxic Respiratory Failure Patient was discharged on 12/5 after being hospitalized for acute on chronic hypoxic respiratory failure  secondary to moderate-large pericardial effusion with cardiac tamponade and pleural effusions. Patient underwent pericardiocentesis and required chest tube. Cytology of pleural/pericardial fluid was negative for malignancy. Patient was discharged with prescription for *** with plan to ***.    Plan: - Continue  2. ESRD on HD Patient recently discharged on 12/5 after being hospitalized for acute on chronic hypoxic respiratory failure. Patient was started on HD (MWF) during this hospitalization with placement of AV fistula. Patient denies missing any HD sessions over the last 3 weeks. Patient denies fever, chills, CP, SOB, LE swelling, or altered mental status.  Plan: - Continue HD MWF and f/u w/ nephrology  3. HFrEF Patient has a history of HFrEF. Echo from 05/2022 demonstrates EF 40-45% w/ LVH and G2DD. Current medications include isosorbide mononitrate 30 mg daily, hydrALAZINE 50 mg q8 hours, carvedilol 12.5 mg BID, amLODipine 10 mg daily. Patient states that they are *** compliant with these medications. Patient states that they do *** check their BP regularly at home. Patient denies HA, lightheadedness, dizziness, CP, palpitations, SOB, or LE swelling. Initial BP today is ***. Repeat BP is ***.   Plan: - Continue isosorbide mononitrate 30 mg daily, hydrALAZINE 50 mg q8 hours, carvedilol 12.5 mg BID, amLODipine 10 mg daily  4. T2DM Patient has a history of T2DM. Current medications include NovoLIN 70/30 5 units BID. Patient states that they are *** compliant with this medication. Patient does *** check their blood sugar at home regularly and notes values ***. Patient denies polyuria, polydipsia, fatigue. Patient states that they do *** visit the ophthalmologist for yearly eye exams. A1c was 6.7% two months ago. Foot exam today***.   Plan: - Continue NovoLIN 70/30 5 units BID - F/u opthalmology for yearly eye exams*** - Repeat A1c in 1 month  5. Lung cancer Patient has a history of stage 1A  small cell lung cancer of the right upper lobe initially diagnosed in 11/2021. Patient underwent radiation. CT during her recent hospitalization in 06/2022 did not demonstrate a nodule but may have been obscured by opacities. MRI brain did not demonstrate any mets. Patient continues to f/u w/ oncology and has an appointment scheduled for 09/2022.   Plan: - Continue f/u w/ oncology  6. Health Screening: - Hep C (), influenza (), mammogram (), colonoscopy () - Medication refill?  7. OSA Screening Patient denies previous sleep study, diagnosis of OSA, or use of CPAP. Patient denies snoring, interruptions in breathing while sleeping, or daytime tiredness. Patient does *** have a history of HTN. Age is ***. Gender is ***. BMI is *** (> 35). Neck circumference is *** (> 40 cm). STOP-BANG score is ***.   Plan: -      See Encounters Tab for  problem based charting.  Patient seen with Dr. Barbaraann Boys

## 2022-09-05 NOTE — Assessment & Plan Note (Addendum)
Patient living with HTN, currently on norvasc 10mg  daily, coreg 12.5mg  BID, hydralazine 50mg  q8h, and lasix 40mg  daily prn. She was previously also on imdur, losartan, and torsemide which were discontinued on hospital discharge and torsemide switched to lasix prn. BP is 133/53 in clinic today. She does report running out of her medications yesterday and thus has not taken any today. Will refill her current medications.  Plan: -continue norvasc, coreg, hydralazine, and lasix daily prn -HD MWF

## 2022-09-05 NOTE — Assessment & Plan Note (Signed)
Refilled lipitor 80mg  daily.

## 2022-09-05 NOTE — Assessment & Plan Note (Signed)
Patient is ESRD on HD MWF. She was recently hospitalized for volume overload and hypertensive urgency at [facility]. She has been adherent with her HD. She went to HD the Friday prior to hospital admission and was unable to go the following Monday due to [facility], requiring admission. She was dialyzed there and obtained IV antihypertensive therapy for BP control. Since hospital discharge, she reports doing well at home and has remained adherent with her HD schedule.  -continue HD MWF

## 2022-09-06 DIAGNOSIS — N186 End stage renal disease: Secondary | ICD-10-CM | POA: Diagnosis not present

## 2022-09-06 DIAGNOSIS — Z992 Dependence on renal dialysis: Secondary | ICD-10-CM | POA: Diagnosis not present

## 2022-09-06 DIAGNOSIS — N2581 Secondary hyperparathyroidism of renal origin: Secondary | ICD-10-CM | POA: Diagnosis not present

## 2022-09-06 NOTE — Progress Notes (Signed)
Internal Medicine Clinic Attending  Case discussed with Dr. Jinwala  At the time of the visit.  We reviewed the resident's history and exam and pertinent patient test results.  I agree with the assessment, diagnosis, and plan of care documented in the resident's note.  

## 2022-09-09 DIAGNOSIS — Z992 Dependence on renal dialysis: Secondary | ICD-10-CM | POA: Diagnosis not present

## 2022-09-09 DIAGNOSIS — N2581 Secondary hyperparathyroidism of renal origin: Secondary | ICD-10-CM | POA: Diagnosis not present

## 2022-09-09 DIAGNOSIS — N186 End stage renal disease: Secondary | ICD-10-CM | POA: Diagnosis not present

## 2022-09-10 ENCOUNTER — Encounter: Payer: Self-pay | Admitting: *Deleted

## 2022-09-10 NOTE — Progress Notes (Signed)
Received patient referral last week. Referral states patient has been seen by Dr Tammi Klippel but wants to transfer care to [facility]. Dr Tammi Klippel is a Radiation Oncologist and we do not have a matching provider here.   Patient was called last Friday, and again today, both times leaving a message requesting a call back for clarification on the referral.

## 2022-09-11 DIAGNOSIS — Z992 Dependence on renal dialysis: Secondary | ICD-10-CM | POA: Diagnosis not present

## 2022-09-11 DIAGNOSIS — N2581 Secondary hyperparathyroidism of renal origin: Secondary | ICD-10-CM | POA: Diagnosis not present

## 2022-09-11 DIAGNOSIS — N186 End stage renal disease: Secondary | ICD-10-CM | POA: Diagnosis not present

## 2022-09-13 DIAGNOSIS — Z992 Dependence on renal dialysis: Secondary | ICD-10-CM | POA: Diagnosis not present

## 2022-09-13 DIAGNOSIS — N2581 Secondary hyperparathyroidism of renal origin: Secondary | ICD-10-CM | POA: Diagnosis not present

## 2022-09-13 DIAGNOSIS — N186 End stage renal disease: Secondary | ICD-10-CM | POA: Diagnosis not present

## 2022-09-16 DIAGNOSIS — E1122 Type 2 diabetes mellitus with diabetic chronic kidney disease: Secondary | ICD-10-CM | POA: Diagnosis not present

## 2022-09-16 DIAGNOSIS — N2581 Secondary hyperparathyroidism of renal origin: Secondary | ICD-10-CM | POA: Diagnosis not present

## 2022-09-16 DIAGNOSIS — N186 End stage renal disease: Secondary | ICD-10-CM | POA: Diagnosis not present

## 2022-09-16 DIAGNOSIS — Z992 Dependence on renal dialysis: Secondary | ICD-10-CM | POA: Diagnosis not present

## 2022-09-18 DIAGNOSIS — N2581 Secondary hyperparathyroidism of renal origin: Secondary | ICD-10-CM | POA: Diagnosis not present

## 2022-09-18 DIAGNOSIS — N186 End stage renal disease: Secondary | ICD-10-CM | POA: Diagnosis not present

## 2022-09-18 DIAGNOSIS — Z992 Dependence on renal dialysis: Secondary | ICD-10-CM | POA: Diagnosis not present

## 2022-09-18 DIAGNOSIS — E1122 Type 2 diabetes mellitus with diabetic chronic kidney disease: Secondary | ICD-10-CM | POA: Diagnosis not present

## 2022-09-20 DIAGNOSIS — E1122 Type 2 diabetes mellitus with diabetic chronic kidney disease: Secondary | ICD-10-CM | POA: Diagnosis not present

## 2022-09-20 DIAGNOSIS — N186 End stage renal disease: Secondary | ICD-10-CM | POA: Diagnosis not present

## 2022-09-20 DIAGNOSIS — N2581 Secondary hyperparathyroidism of renal origin: Secondary | ICD-10-CM | POA: Diagnosis not present

## 2022-09-20 DIAGNOSIS — Z992 Dependence on renal dialysis: Secondary | ICD-10-CM | POA: Diagnosis not present

## 2022-09-22 DIAGNOSIS — E042 Nontoxic multinodular goiter: Secondary | ICD-10-CM | POA: Diagnosis not present

## 2022-09-22 DIAGNOSIS — F5104 Psychophysiologic insomnia: Secondary | ICD-10-CM | POA: Diagnosis not present

## 2022-09-22 DIAGNOSIS — J90 Pleural effusion, not elsewhere classified: Secondary | ICD-10-CM | POA: Diagnosis not present

## 2022-09-22 DIAGNOSIS — E785 Hyperlipidemia, unspecified: Secondary | ICD-10-CM | POA: Diagnosis not present

## 2022-09-22 DIAGNOSIS — I10 Essential (primary) hypertension: Secondary | ICD-10-CM | POA: Diagnosis not present

## 2022-09-22 DIAGNOSIS — D631 Anemia in chronic kidney disease: Secondary | ICD-10-CM | POA: Diagnosis not present

## 2022-09-22 DIAGNOSIS — E854 Organ-limited amyloidosis: Secondary | ICD-10-CM | POA: Diagnosis not present

## 2022-09-22 DIAGNOSIS — Z87891 Personal history of nicotine dependence: Secondary | ICD-10-CM | POA: Diagnosis not present

## 2022-09-22 DIAGNOSIS — I501 Left ventricular failure: Secondary | ICD-10-CM | POA: Diagnosis not present

## 2022-09-22 DIAGNOSIS — Z794 Long term (current) use of insulin: Secondary | ICD-10-CM | POA: Diagnosis not present

## 2022-09-22 DIAGNOSIS — I132 Hypertensive heart and chronic kidney disease with heart failure and with stage 5 chronic kidney disease, or end stage renal disease: Secondary | ICD-10-CM | POA: Diagnosis not present

## 2022-09-22 DIAGNOSIS — I161 Hypertensive emergency: Secondary | ICD-10-CM | POA: Diagnosis not present

## 2022-09-22 DIAGNOSIS — G8929 Other chronic pain: Secondary | ICD-10-CM | POA: Diagnosis not present

## 2022-09-22 DIAGNOSIS — R0602 Shortness of breath: Secondary | ICD-10-CM | POA: Diagnosis not present

## 2022-09-22 DIAGNOSIS — N186 End stage renal disease: Secondary | ICD-10-CM | POA: Diagnosis not present

## 2022-09-22 DIAGNOSIS — J9600 Acute respiratory failure, unspecified whether with hypoxia or hypercapnia: Secondary | ICD-10-CM | POA: Diagnosis not present

## 2022-09-22 DIAGNOSIS — I2510 Atherosclerotic heart disease of native coronary artery without angina pectoris: Secondary | ICD-10-CM | POA: Diagnosis not present

## 2022-09-22 DIAGNOSIS — J9601 Acute respiratory failure with hypoxia: Secondary | ICD-10-CM | POA: Diagnosis not present

## 2022-09-22 DIAGNOSIS — E8779 Other fluid overload: Secondary | ICD-10-CM | POA: Diagnosis not present

## 2022-09-22 DIAGNOSIS — I081 Rheumatic disorders of both mitral and tricuspid valves: Secondary | ICD-10-CM | POA: Diagnosis not present

## 2022-09-22 DIAGNOSIS — Z79899 Other long term (current) drug therapy: Secondary | ICD-10-CM | POA: Diagnosis not present

## 2022-09-22 DIAGNOSIS — I509 Heart failure, unspecified: Secondary | ICD-10-CM | POA: Diagnosis not present

## 2022-09-22 DIAGNOSIS — I43 Cardiomyopathy in diseases classified elsewhere: Secondary | ICD-10-CM | POA: Diagnosis not present

## 2022-09-22 DIAGNOSIS — Z20822 Contact with and (suspected) exposure to covid-19: Secondary | ICD-10-CM | POA: Diagnosis not present

## 2022-09-22 DIAGNOSIS — I160 Hypertensive urgency: Secondary | ICD-10-CM | POA: Diagnosis not present

## 2022-09-22 DIAGNOSIS — J9610 Chronic respiratory failure, unspecified whether with hypoxia or hypercapnia: Secondary | ICD-10-CM | POA: Diagnosis not present

## 2022-09-22 DIAGNOSIS — I120 Hypertensive chronic kidney disease with stage 5 chronic kidney disease or end stage renal disease: Secondary | ICD-10-CM | POA: Diagnosis not present

## 2022-09-22 DIAGNOSIS — E1122 Type 2 diabetes mellitus with diabetic chronic kidney disease: Secondary | ICD-10-CM | POA: Diagnosis not present

## 2022-09-22 DIAGNOSIS — J9621 Acute and chronic respiratory failure with hypoxia: Secondary | ICD-10-CM | POA: Diagnosis not present

## 2022-09-22 DIAGNOSIS — I110 Hypertensive heart disease with heart failure: Secondary | ICD-10-CM | POA: Diagnosis not present

## 2022-09-22 DIAGNOSIS — J810 Acute pulmonary edema: Secondary | ICD-10-CM | POA: Diagnosis not present

## 2022-09-22 DIAGNOSIS — I340 Nonrheumatic mitral (valve) insufficiency: Secondary | ICD-10-CM | POA: Diagnosis not present

## 2022-09-22 DIAGNOSIS — E8770 Fluid overload, unspecified: Secondary | ICD-10-CM | POA: Diagnosis not present

## 2022-09-22 DIAGNOSIS — J918 Pleural effusion in other conditions classified elsewhere: Secondary | ICD-10-CM | POA: Diagnosis not present

## 2022-09-22 DIAGNOSIS — Z743 Need for continuous supervision: Secondary | ICD-10-CM | POA: Diagnosis not present

## 2022-09-22 DIAGNOSIS — R0600 Dyspnea, unspecified: Secondary | ICD-10-CM | POA: Diagnosis not present

## 2022-09-22 DIAGNOSIS — I272 Pulmonary hypertension, unspecified: Secondary | ICD-10-CM | POA: Diagnosis not present

## 2022-09-22 DIAGNOSIS — I5030 Unspecified diastolic (congestive) heart failure: Secondary | ICD-10-CM | POA: Diagnosis not present

## 2022-09-22 DIAGNOSIS — C3490 Malignant neoplasm of unspecified part of unspecified bronchus or lung: Secondary | ICD-10-CM | POA: Diagnosis not present

## 2022-09-22 DIAGNOSIS — I5020 Unspecified systolic (congestive) heart failure: Secondary | ICD-10-CM | POA: Diagnosis not present

## 2022-09-22 DIAGNOSIS — Z992 Dependence on renal dialysis: Secondary | ICD-10-CM | POA: Diagnosis not present

## 2022-09-22 DIAGNOSIS — Z9981 Dependence on supplemental oxygen: Secondary | ICD-10-CM | POA: Diagnosis not present

## 2022-09-22 DIAGNOSIS — M6281 Muscle weakness (generalized): Secondary | ICD-10-CM | POA: Diagnosis not present

## 2022-09-23 DIAGNOSIS — E1122 Type 2 diabetes mellitus with diabetic chronic kidney disease: Secondary | ICD-10-CM | POA: Diagnosis not present

## 2022-09-23 DIAGNOSIS — N186 End stage renal disease: Secondary | ICD-10-CM | POA: Diagnosis not present

## 2022-09-23 DIAGNOSIS — Z992 Dependence on renal dialysis: Secondary | ICD-10-CM | POA: Diagnosis not present

## 2022-09-23 DIAGNOSIS — R0602 Shortness of breath: Secondary | ICD-10-CM | POA: Diagnosis not present

## 2022-09-23 DIAGNOSIS — I161 Hypertensive emergency: Secondary | ICD-10-CM | POA: Diagnosis not present

## 2022-09-23 DIAGNOSIS — I517 Cardiomegaly: Secondary | ICD-10-CM | POA: Diagnosis not present

## 2022-09-23 DIAGNOSIS — I120 Hypertensive chronic kidney disease with stage 5 chronic kidney disease or end stage renal disease: Secondary | ICD-10-CM | POA: Diagnosis not present

## 2022-09-23 DIAGNOSIS — I132 Hypertensive heart and chronic kidney disease with heart failure and with stage 5 chronic kidney disease, or end stage renal disease: Secondary | ICD-10-CM | POA: Diagnosis not present

## 2022-09-23 DIAGNOSIS — E8770 Fluid overload, unspecified: Secondary | ICD-10-CM | POA: Diagnosis not present

## 2022-09-23 DIAGNOSIS — I4581 Long QT syndrome: Secondary | ICD-10-CM | POA: Diagnosis not present

## 2022-09-24 ENCOUNTER — Inpatient Hospital Stay: Payer: 59

## 2022-09-24 ENCOUNTER — Inpatient Hospital Stay: Payer: 59 | Admitting: Hematology & Oncology

## 2022-09-24 DIAGNOSIS — E8779 Other fluid overload: Secondary | ICD-10-CM | POA: Diagnosis not present

## 2022-09-24 DIAGNOSIS — E1122 Type 2 diabetes mellitus with diabetic chronic kidney disease: Secondary | ICD-10-CM | POA: Diagnosis not present

## 2022-09-24 DIAGNOSIS — I5020 Unspecified systolic (congestive) heart failure: Secondary | ICD-10-CM | POA: Diagnosis not present

## 2022-09-24 DIAGNOSIS — J9601 Acute respiratory failure with hypoxia: Secondary | ICD-10-CM | POA: Diagnosis not present

## 2022-09-24 DIAGNOSIS — I120 Hypertensive chronic kidney disease with stage 5 chronic kidney disease or end stage renal disease: Secondary | ICD-10-CM | POA: Diagnosis not present

## 2022-09-24 DIAGNOSIS — I340 Nonrheumatic mitral (valve) insufficiency: Secondary | ICD-10-CM | POA: Diagnosis not present

## 2022-09-24 DIAGNOSIS — I132 Hypertensive heart and chronic kidney disease with heart failure and with stage 5 chronic kidney disease, or end stage renal disease: Secondary | ICD-10-CM | POA: Diagnosis not present

## 2022-09-24 DIAGNOSIS — N186 End stage renal disease: Secondary | ICD-10-CM | POA: Diagnosis not present

## 2022-09-25 DIAGNOSIS — N186 End stage renal disease: Secondary | ICD-10-CM | POA: Diagnosis not present

## 2022-09-25 DIAGNOSIS — R0602 Shortness of breath: Secondary | ICD-10-CM | POA: Diagnosis not present

## 2022-09-25 DIAGNOSIS — J90 Pleural effusion, not elsewhere classified: Secondary | ICD-10-CM | POA: Diagnosis not present

## 2022-09-26 DIAGNOSIS — I129 Hypertensive chronic kidney disease with stage 1 through stage 4 chronic kidney disease, or unspecified chronic kidney disease: Secondary | ICD-10-CM | POA: Diagnosis not present

## 2022-09-26 DIAGNOSIS — N186 End stage renal disease: Secondary | ICD-10-CM | POA: Diagnosis not present

## 2022-09-26 DIAGNOSIS — Z992 Dependence on renal dialysis: Secondary | ICD-10-CM | POA: Diagnosis not present

## 2022-09-27 ENCOUNTER — Other Ambulatory Visit: Payer: Self-pay | Admitting: Student

## 2022-09-27 DIAGNOSIS — N2581 Secondary hyperparathyroidism of renal origin: Secondary | ICD-10-CM | POA: Diagnosis not present

## 2022-09-27 DIAGNOSIS — N186 End stage renal disease: Secondary | ICD-10-CM | POA: Diagnosis not present

## 2022-09-27 DIAGNOSIS — Z992 Dependence on renal dialysis: Secondary | ICD-10-CM | POA: Diagnosis not present

## 2022-09-27 DIAGNOSIS — I5022 Chronic systolic (congestive) heart failure: Secondary | ICD-10-CM

## 2022-09-30 DIAGNOSIS — N2581 Secondary hyperparathyroidism of renal origin: Secondary | ICD-10-CM | POA: Diagnosis not present

## 2022-09-30 DIAGNOSIS — N186 End stage renal disease: Secondary | ICD-10-CM | POA: Diagnosis not present

## 2022-09-30 DIAGNOSIS — Z992 Dependence on renal dialysis: Secondary | ICD-10-CM | POA: Diagnosis not present

## 2022-10-02 ENCOUNTER — Ambulatory Visit (HOSPITAL_COMMUNITY): Payer: 59

## 2022-10-02 ENCOUNTER — Telehealth: Payer: Self-pay | Admitting: Radiation Therapy

## 2022-10-02 ENCOUNTER — Ambulatory Visit: Payer: Self-pay | Admitting: Urology

## 2022-10-02 DIAGNOSIS — N2581 Secondary hyperparathyroidism of renal origin: Secondary | ICD-10-CM | POA: Diagnosis not present

## 2022-10-02 DIAGNOSIS — N186 End stage renal disease: Secondary | ICD-10-CM | POA: Diagnosis not present

## 2022-10-02 DIAGNOSIS — Z992 Dependence on renal dialysis: Secondary | ICD-10-CM | POA: Diagnosis not present

## 2022-10-02 NOTE — Telephone Encounter (Signed)
Left a detailed message for pt regarding reschedule brain MRI and Chest CT appointments. I will also call her son for an update.   Mont Dutton R.T.(R)(T) Radiation Special Procedures Navigator

## 2022-10-04 DIAGNOSIS — Z992 Dependence on renal dialysis: Secondary | ICD-10-CM | POA: Diagnosis not present

## 2022-10-04 DIAGNOSIS — N2581 Secondary hyperparathyroidism of renal origin: Secondary | ICD-10-CM | POA: Diagnosis not present

## 2022-10-04 DIAGNOSIS — N186 End stage renal disease: Secondary | ICD-10-CM | POA: Diagnosis not present

## 2022-10-07 ENCOUNTER — Inpatient Hospital Stay: Payer: 59

## 2022-10-07 DIAGNOSIS — N186 End stage renal disease: Secondary | ICD-10-CM | POA: Diagnosis not present

## 2022-10-07 DIAGNOSIS — N2581 Secondary hyperparathyroidism of renal origin: Secondary | ICD-10-CM | POA: Diagnosis not present

## 2022-10-07 DIAGNOSIS — Z992 Dependence on renal dialysis: Secondary | ICD-10-CM | POA: Diagnosis not present

## 2022-10-09 ENCOUNTER — Ambulatory Visit: Payer: 59 | Admitting: Urology

## 2022-10-09 DIAGNOSIS — N186 End stage renal disease: Secondary | ICD-10-CM | POA: Diagnosis not present

## 2022-10-09 DIAGNOSIS — Z992 Dependence on renal dialysis: Secondary | ICD-10-CM | POA: Diagnosis not present

## 2022-10-09 DIAGNOSIS — N2581 Secondary hyperparathyroidism of renal origin: Secondary | ICD-10-CM | POA: Diagnosis not present

## 2022-10-10 ENCOUNTER — Encounter: Payer: 59 | Admitting: Student

## 2022-10-11 DIAGNOSIS — Z880 Allergy status to penicillin: Secondary | ICD-10-CM | POA: Diagnosis not present

## 2022-10-11 DIAGNOSIS — Z794 Long term (current) use of insulin: Secondary | ICD-10-CM | POA: Diagnosis not present

## 2022-10-11 DIAGNOSIS — I132 Hypertensive heart and chronic kidney disease with heart failure and with stage 5 chronic kidney disease, or end stage renal disease: Secondary | ICD-10-CM | POA: Diagnosis not present

## 2022-10-11 DIAGNOSIS — Z9981 Dependence on supplemental oxygen: Secondary | ICD-10-CM | POA: Diagnosis not present

## 2022-10-11 DIAGNOSIS — J449 Chronic obstructive pulmonary disease, unspecified: Secondary | ICD-10-CM | POA: Diagnosis not present

## 2022-10-11 DIAGNOSIS — Z743 Need for continuous supervision: Secondary | ICD-10-CM | POA: Diagnosis not present

## 2022-10-11 DIAGNOSIS — Z923 Personal history of irradiation: Secondary | ICD-10-CM | POA: Diagnosis not present

## 2022-10-11 DIAGNOSIS — I5023 Acute on chronic systolic (congestive) heart failure: Secondary | ICD-10-CM | POA: Diagnosis not present

## 2022-10-11 DIAGNOSIS — R7989 Other specified abnormal findings of blood chemistry: Secondary | ICD-10-CM | POA: Diagnosis not present

## 2022-10-11 DIAGNOSIS — Z992 Dependence on renal dialysis: Secondary | ICD-10-CM | POA: Diagnosis not present

## 2022-10-11 DIAGNOSIS — Z85118 Personal history of other malignant neoplasm of bronchus and lung: Secondary | ICD-10-CM | POA: Diagnosis not present

## 2022-10-11 DIAGNOSIS — I161 Hypertensive emergency: Secondary | ICD-10-CM | POA: Diagnosis not present

## 2022-10-11 DIAGNOSIS — R61 Generalized hyperhidrosis: Secondary | ICD-10-CM | POA: Diagnosis not present

## 2022-10-11 DIAGNOSIS — Z87891 Personal history of nicotine dependence: Secondary | ICD-10-CM | POA: Diagnosis not present

## 2022-10-11 DIAGNOSIS — J90 Pleural effusion, not elsewhere classified: Secondary | ICD-10-CM | POA: Diagnosis not present

## 2022-10-11 DIAGNOSIS — Z7982 Long term (current) use of aspirin: Secondary | ICD-10-CM | POA: Diagnosis not present

## 2022-10-11 DIAGNOSIS — Z8673 Personal history of transient ischemic attack (TIA), and cerebral infarction without residual deficits: Secondary | ICD-10-CM | POA: Diagnosis not present

## 2022-10-11 DIAGNOSIS — I5043 Acute on chronic combined systolic (congestive) and diastolic (congestive) heart failure: Secondary | ICD-10-CM | POA: Diagnosis not present

## 2022-10-11 DIAGNOSIS — R918 Other nonspecific abnormal finding of lung field: Secondary | ICD-10-CM | POA: Diagnosis not present

## 2022-10-11 DIAGNOSIS — F17200 Nicotine dependence, unspecified, uncomplicated: Secondary | ICD-10-CM | POA: Diagnosis not present

## 2022-10-11 DIAGNOSIS — Z91199 Patient's noncompliance with other medical treatment and regimen due to unspecified reason: Secondary | ICD-10-CM | POA: Diagnosis not present

## 2022-10-11 DIAGNOSIS — D631 Anemia in chronic kidney disease: Secondary | ICD-10-CM | POA: Diagnosis not present

## 2022-10-11 DIAGNOSIS — N186 End stage renal disease: Secondary | ICD-10-CM | POA: Diagnosis not present

## 2022-10-11 DIAGNOSIS — E1122 Type 2 diabetes mellitus with diabetic chronic kidney disease: Secondary | ICD-10-CM | POA: Diagnosis not present

## 2022-10-11 DIAGNOSIS — J9601 Acute respiratory failure with hypoxia: Secondary | ICD-10-CM | POA: Diagnosis not present

## 2022-10-11 DIAGNOSIS — R0902 Hypoxemia: Secondary | ICD-10-CM | POA: Diagnosis not present

## 2022-10-11 DIAGNOSIS — I10 Essential (primary) hypertension: Secondary | ICD-10-CM | POA: Diagnosis not present

## 2022-10-11 DIAGNOSIS — N2581 Secondary hyperparathyroidism of renal origin: Secondary | ICD-10-CM | POA: Diagnosis not present

## 2022-10-11 DIAGNOSIS — I340 Nonrheumatic mitral (valve) insufficiency: Secondary | ICD-10-CM | POA: Diagnosis not present

## 2022-10-11 DIAGNOSIS — R0602 Shortness of breath: Secondary | ICD-10-CM | POA: Diagnosis not present

## 2022-10-11 DIAGNOSIS — R0603 Acute respiratory distress: Secondary | ICD-10-CM | POA: Diagnosis not present

## 2022-10-11 DIAGNOSIS — I272 Pulmonary hypertension, unspecified: Secondary | ICD-10-CM | POA: Diagnosis not present

## 2022-10-11 DIAGNOSIS — E785 Hyperlipidemia, unspecified: Secondary | ICD-10-CM | POA: Diagnosis not present

## 2022-10-11 DIAGNOSIS — Z91119 Patient's noncompliance with dietary regimen due to unspecified reason: Secondary | ICD-10-CM | POA: Diagnosis not present

## 2022-10-11 DIAGNOSIS — Z79899 Other long term (current) drug therapy: Secondary | ICD-10-CM | POA: Diagnosis not present

## 2022-10-12 DIAGNOSIS — N186 End stage renal disease: Secondary | ICD-10-CM | POA: Diagnosis not present

## 2022-10-12 DIAGNOSIS — Z992 Dependence on renal dialysis: Secondary | ICD-10-CM | POA: Diagnosis not present

## 2022-10-12 DIAGNOSIS — I5043 Acute on chronic combined systolic (congestive) and diastolic (congestive) heart failure: Secondary | ICD-10-CM | POA: Diagnosis not present

## 2022-10-14 ENCOUNTER — Telehealth: Payer: Self-pay

## 2022-10-14 DIAGNOSIS — E1122 Type 2 diabetes mellitus with diabetic chronic kidney disease: Secondary | ICD-10-CM | POA: Diagnosis not present

## 2022-10-14 DIAGNOSIS — R000 Tachycardia, unspecified: Secondary | ICD-10-CM | POA: Diagnosis not present

## 2022-10-14 DIAGNOSIS — N2581 Secondary hyperparathyroidism of renal origin: Secondary | ICD-10-CM | POA: Diagnosis not present

## 2022-10-14 DIAGNOSIS — N186 End stage renal disease: Secondary | ICD-10-CM | POA: Diagnosis not present

## 2022-10-14 DIAGNOSIS — Z992 Dependence on renal dialysis: Secondary | ICD-10-CM | POA: Diagnosis not present

## 2022-10-14 DIAGNOSIS — R0602 Shortness of breath: Secondary | ICD-10-CM | POA: Diagnosis not present

## 2022-10-14 NOTE — Transitions of Care (Post Inpatient/ED Visit) (Unsigned)
   10/14/2022  Name: Amber Cross MRN: [id number] DOB: 12/24/1960  Today's TOC FU Call Status: Today's TOC FU Call Status:: Unsuccessul Call (1st Attempt) Unsuccessful Call (1st Attempt) Date: 10/14/22  Attempted to reach the patient regarding the most recent Inpatient/ED visit.  Follow Up Plan: Additional outreach attempts will be made to reach the patient to complete the Transitions of Care (Post Inpatient/ED visit) call.   Signature Juanda Crumble, [place] Direct Dial [phone]

## 2022-10-15 DIAGNOSIS — Z992 Dependence on renal dialysis: Secondary | ICD-10-CM | POA: Diagnosis not present

## 2022-10-15 DIAGNOSIS — Z452 Encounter for adjustment and management of vascular access device: Secondary | ICD-10-CM | POA: Diagnosis not present

## 2022-10-15 DIAGNOSIS — N186 End stage renal disease: Secondary | ICD-10-CM | POA: Diagnosis not present

## 2022-10-15 NOTE — Transitions of Care (Post Inpatient/ED Visit) (Signed)
   10/15/2022  Name: Amber Cross MRN: [id number] DOB: Apr 22, 1961  Today's TOC FU Call Status: Today's TOC FU Call Status:: Successful TOC FU Call Competed Unsuccessful Call (1st Attempt) Date: 10/14/22 [facility] FU Call Complete Date: 10/15/22  Transition Care Management Follow-up Telephone Call Date of Discharge: 10/13/22 Discharge Facility: [facility] Type of Discharge: Inpatient Admission Primary Inpatient Discharge Diagnosis:: respiratory failure How have you been since you were released from the hospital?: Better Any questions or concerns?: No  Items Reviewed: Did you receive and understand the discharge instructions provided?: Yes Medications obtained and verified?: Yes (Medications Reviewed) Any new allergies since your discharge?: No Dietary orders reviewed?: Yes Do you have support at home?: No  Home Care and Equipment/Supplies: [place] Ordered?: NA Any new equipment or medical supplies ordered?: NA  Functional Questionnaire: Do you need assistance with bathing/showering or dressing?: No Do you need assistance with meal preparation?: No Do you need assistance with eating?: No Do you have difficulty maintaining continence: No Do you need assistance with getting out of bed/getting out of a chair/moving?: No Do you have difficulty managing or taking your medications?: No  Follow up appointments reviewed: PCP Follow-up appointment confirmed?: Yes Date of PCP follow-up appointment?: 10/24/22 Follow-up Provider: [facility] Follow-up appointment confirmed?: NA Do you need transportation to your follow-up appointment?: No Do you understand care options if your condition(s) worsen?: Yes-patient verbalized understanding    [place], [place] Nurse Health Advisor Direct Dial [phone]

## 2022-10-17 ENCOUNTER — Inpatient Hospital Stay: Payer: 59

## 2022-10-17 ENCOUNTER — Inpatient Hospital Stay: Payer: 59 | Admitting: Hematology & Oncology

## 2022-10-17 DIAGNOSIS — E1122 Type 2 diabetes mellitus with diabetic chronic kidney disease: Secondary | ICD-10-CM | POA: Diagnosis not present

## 2022-10-17 DIAGNOSIS — N2581 Secondary hyperparathyroidism of renal origin: Secondary | ICD-10-CM | POA: Diagnosis not present

## 2022-10-17 DIAGNOSIS — N186 End stage renal disease: Secondary | ICD-10-CM | POA: Diagnosis not present

## 2022-10-17 DIAGNOSIS — Z992 Dependence on renal dialysis: Secondary | ICD-10-CM | POA: Diagnosis not present

## 2022-10-18 DIAGNOSIS — N186 End stage renal disease: Secondary | ICD-10-CM | POA: Diagnosis not present

## 2022-10-18 DIAGNOSIS — N2581 Secondary hyperparathyroidism of renal origin: Secondary | ICD-10-CM | POA: Diagnosis not present

## 2022-10-18 DIAGNOSIS — Z992 Dependence on renal dialysis: Secondary | ICD-10-CM | POA: Diagnosis not present

## 2022-10-18 DIAGNOSIS — E1122 Type 2 diabetes mellitus with diabetic chronic kidney disease: Secondary | ICD-10-CM | POA: Diagnosis not present

## 2022-10-21 ENCOUNTER — Telehealth: Payer: Self-pay | Admitting: Radiation Therapy

## 2022-10-21 DIAGNOSIS — N186 End stage renal disease: Secondary | ICD-10-CM | POA: Diagnosis not present

## 2022-10-21 DIAGNOSIS — N2581 Secondary hyperparathyroidism of renal origin: Secondary | ICD-10-CM | POA: Diagnosis not present

## 2022-10-21 DIAGNOSIS — Z992 Dependence on renal dialysis: Secondary | ICD-10-CM | POA: Diagnosis not present

## 2022-10-21 NOTE — Telephone Encounter (Signed)
I left a detailed voicemail requesting a call back about the scans originally scheduled for Amber Cross on 3/26. Both the CT and MRI are pending insurance approval and need to be rescheduled. I have included my number, the number to central scheduling and have also asked central scheduling to call the patient and rescheduled these scans.   Mont Dutton R.T.(R)(T) Radiation Special Procedures Navigator

## 2022-10-22 ENCOUNTER — Ambulatory Visit (HOSPITAL_COMMUNITY): Payer: 59

## 2022-10-23 ENCOUNTER — Encounter (INDEPENDENT_AMBULATORY_CARE_PROVIDER_SITE_OTHER): Payer: Self-pay | Admitting: Ophthalmology

## 2022-10-23 DIAGNOSIS — N186 End stage renal disease: Secondary | ICD-10-CM | POA: Diagnosis not present

## 2022-10-23 DIAGNOSIS — Z992 Dependence on renal dialysis: Secondary | ICD-10-CM | POA: Diagnosis not present

## 2022-10-23 DIAGNOSIS — N2581 Secondary hyperparathyroidism of renal origin: Secondary | ICD-10-CM | POA: Diagnosis not present

## 2022-10-24 ENCOUNTER — Encounter: Payer: 59 | Admitting: Student

## 2022-10-25 DIAGNOSIS — Z992 Dependence on renal dialysis: Secondary | ICD-10-CM | POA: Diagnosis not present

## 2022-10-25 DIAGNOSIS — N2581 Secondary hyperparathyroidism of renal origin: Secondary | ICD-10-CM | POA: Diagnosis not present

## 2022-10-25 DIAGNOSIS — N186 End stage renal disease: Secondary | ICD-10-CM | POA: Diagnosis not present

## 2022-10-27 DIAGNOSIS — N186 End stage renal disease: Secondary | ICD-10-CM | POA: Diagnosis not present

## 2022-10-27 DIAGNOSIS — I129 Hypertensive chronic kidney disease with stage 1 through stage 4 chronic kidney disease, or unspecified chronic kidney disease: Secondary | ICD-10-CM | POA: Diagnosis not present

## 2022-10-27 DIAGNOSIS — Z992 Dependence on renal dialysis: Secondary | ICD-10-CM | POA: Diagnosis not present

## 2022-10-28 ENCOUNTER — Inpatient Hospital Stay: Payer: 59

## 2022-10-28 ENCOUNTER — Ambulatory Visit: Payer: 59 | Admitting: Radiation Oncology

## 2022-10-28 DIAGNOSIS — N2581 Secondary hyperparathyroidism of renal origin: Secondary | ICD-10-CM | POA: Diagnosis not present

## 2022-10-28 DIAGNOSIS — Z992 Dependence on renal dialysis: Secondary | ICD-10-CM | POA: Diagnosis not present

## 2022-10-28 DIAGNOSIS — N186 End stage renal disease: Secondary | ICD-10-CM | POA: Diagnosis not present

## 2022-10-29 ENCOUNTER — Encounter (INDEPENDENT_AMBULATORY_CARE_PROVIDER_SITE_OTHER): Payer: 59 | Admitting: Ophthalmology

## 2022-10-29 DIAGNOSIS — H3581 Retinal edema: Secondary | ICD-10-CM

## 2022-10-30 DIAGNOSIS — N2581 Secondary hyperparathyroidism of renal origin: Secondary | ICD-10-CM | POA: Diagnosis not present

## 2022-10-30 DIAGNOSIS — N186 End stage renal disease: Secondary | ICD-10-CM | POA: Diagnosis not present

## 2022-10-30 DIAGNOSIS — Z992 Dependence on renal dialysis: Secondary | ICD-10-CM | POA: Diagnosis not present

## 2022-10-30 NOTE — Progress Notes (Signed)
[facility] Note  10/31/2022     CHIEF COMPLAINT Patient presents for No chief complaint on file.   HISTORY OF PRESENT ILLNESS: Amber Cross is a 62 y.o. female who presents to the clinic today for:    Referring physician: Starlyn Skeans, MD [place],  [place] [id number]  HISTORICAL INFORMATION:  Selected notes from the MEDICAL RECORD NUMBER Referred by Shirleen Schirmer, PA-C for concern of CME / PDR LEE:  Ocular Hx- PMH-   CURRENT MEDICATIONS: No current outpatient medications on file. (Ophthalmic Drugs)   No current facility-administered medications for this visit. (Ophthalmic Drugs)   Current Outpatient Medications (Other)  Medication Sig   acetaminophen (TYLENOL) 500 MG tablet Take 1,000 mg by mouth every 6 (six) hours as needed for mild pain.   albuterol (VENTOLIN HFA) 108 (90 Base) MCG/ACT inhaler Inhale 2 puffs into the lungs every 6 (six) hours as needed for wheezing or shortness of breath.   amLODipine (NORVASC) 10 MG tablet Take 1 tablet (10 mg total) by mouth daily.   aspirin EC 81 MG tablet Take 1 tablet (81 mg total) by mouth daily. Swallow whole.   atorvastatin (LIPITOR) 80 MG tablet Take 1 tablet (80 mg total) by mouth daily.   carvedilol (COREG) 12.5 MG tablet Take 1 tablet (12.5 mg total) by mouth 2 (two) times daily with a meal.   Continuous Blood Gluc Sensor (DEXCOM G7 SENSOR) MISC Apply each device on your skin every 10 days (Patient not taking: Reported on 10/15/2022)   furosemide (LASIX) 40 MG tablet Take 1 tablet (40 mg total) by mouth daily as needed (swelling in legs or weight gain).   gabapentin (NEURONTIN) 300 MG capsule Take 1 capsule (300 mg total) by mouth daily.   hydrALAZINE (APRESOLINE) 50 MG tablet Take 1 tablet (50 mg total) by mouth every 8 (eight) hours.   insulin isophane & regular human KwikPen (NOVOLIN 70/30 KWIKPEN) (70-30) 100 UNIT/ML KwikPen Inject 5 Units into the skin 2 (two) times daily.    Insulin Pen Needle (UNIFINE PENTIPS) 32G X 4 MM MISC Use in the morning and at bedtime   nitroGLYCERIN (NITROSTAT) 0.4 MG SL tablet Place 0.4 mg under the tongue every 5 (five) minutes as needed for chest pain.   traZODone (DESYREL) 50 MG tablet Take 1 tablet (50 mg total) by mouth at bedtime.   Vitamin D, Ergocalciferol, (DRISDOL) 1.25 MG (50000 UNIT) CAPS capsule Take 1 capsule (50,000 Units total) by mouth every 7 (seven) days.   No current facility-administered medications for this visit. (Other)    REVIEW OF SYSTEMS:   ALLERGIES Allergies  Allergen Reactions   Penicillins Itching and Other (See Comments)    redness   Strawberry (Diagnostic) Itching and Swelling   Tomato Itching and Swelling    PAST MEDICAL HISTORY Past Medical History:  Diagnosis Date   Anemia    Arthritis    Chest pain 05/07/2021   CHF (congestive heart failure) (HCC)    Chronic kidney disease    dialysis M-W-F   Depression    Diabetes mellitus    Headache    Hypercholesteremia    Hypertension    Hypertensive emergency 06/16/2022   Lung cancer ([place]) 12/18/2021   RUL small cell carcinoma, s/p radiation   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   Postop check 08/05/2022   Pseudogout    Stroke ([place]) 10/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   Tamponade 06/18/2022   Tobacco abuse  Past Surgical History:  Procedure Laterality Date   ANTERIOR CRUCIATE LIGAMENT REPAIR Right    AV FISTULA PLACEMENT Left 07/01/2022   Procedure: LEFT ARTERIOVENOUS (AV) FISTULA CREATION;  Surgeon: Angelia Mould, MD;  Location: [place];  Service: Vascular;  Laterality: Left;  with regional block   BRONCHIAL BIOPSY  12/18/2021   Procedure: BRONCHIAL BIOPSIES;  Surgeon: Garner Nash, DO;  Location: [facility];  Service: Pulmonary;;   BRONCHIAL BRUSHINGS  12/18/2021   Procedure: BRONCHIAL BRUSHINGS;  Surgeon: Garner Nash, DO;  Location: [place];  Service: Pulmonary;;   BRONCHIAL NEEDLE ASPIRATION BIOPSY  12/18/2021    Procedure: BRONCHIAL NEEDLE ASPIRATION BIOPSIES;  Surgeon: Garner Nash, DO;  Location: [place];  Service: Pulmonary;;   CATARACT EXTRACTION     CHEST TUBE INSERTION Right 06/18/2022   Procedure: CHEST TUBE INSERTION;  Surgeon: Dahlia Byes, MD;  Location: [place];  Service: Thoracic;  Laterality: Right;   EXCHANGE OF A DIALYSIS CATHETER N/A 08/08/2022   Procedure: EXCHANGE OF A TUNNELED DIALYSIS CATHETER;  Surgeon: Serafina Mitchell, MD;  Location: [place];  Service: Vascular;  Laterality: N/A;   FIDUCIAL MARKER PLACEMENT  12/18/2021   Procedure: FIDUCIAL MARKER PLACEMENT;  Surgeon: Garner Nash, DO;  Location: [facility];  Service: Pulmonary;;   IR FLUORO GUIDE CV LINE RIGHT  06/26/2022   IR US GUIDE VASC ACCESS RIGHT  06/26/2022   OVARY SURGERY     RIGHT OOPHORECTOMY Right    SMALL INTESTINE SURGERY     SUBXYPHOID PERICARDIAL WINDOW N/A 06/18/2022   Procedure: SUBXYPHOID PERICARDIAL WINDOW;  Surgeon: Dahlia Byes, MD;  Location: [facility];  Service: Thoracic;  Laterality: N/A;   TEE WITHOUT CARDIOVERSION N/A 06/18/2022   Procedure: TRANSESOPHAGEAL ECHOCARDIOGRAM (TEE);  Surgeon: Dahlia Byes, MD;  Location: [place];  Service: Thoracic;  Laterality: N/A;   TUBAL LIGATION     VIDEO BRONCHOSCOPY WITH RADIAL ENDOBRONCHIAL ULTRASOUND  12/18/2021   Procedure: RADIAL ENDOBRONCHIAL ULTRASOUND;  Surgeon: Garner Nash, DO;  [facility];  Service: Pulmonary;;    FAMILY HISTORY Family History  Problem Relation Age of Onset   Diabetes Mother    Hypertension Mother    Hypertension Father     SOCIAL HISTORY Social History   Tobacco Use   Smoking status: Every Day    Packs/day: 0.50    Years: 40.00    Additional pack years: 0.00    Total pack years: 20.00    Types: Cigarettes   Smokeless tobacco: Never   Tobacco comments:    Cutting back 1/4-1/2 ppd  Vaping Use   Vaping Use: Never used  Substance Use Topics   Alcohol use: Not Currently   Drug use: Yes     Frequency: 4.0 times per week    Types: Marijuana    Comment: smokes every other day       OPHTHALMIC EXAM:  Not recorded     IMAGING AND PROCEDURES  Imaging and Procedures for 10/31/2022        ASSESSMENT/PLAN:    ICD-10-CM   1. Retinal edema  H35.81       1.  2.  3.  Ophthalmic Meds Ordered this visit:  No orders of the defined types were placed in this encounter.    No follow-ups on file.  There are no Patient Instructions on file for this visit.  Explained the diagnoses, plan, and follow up with the patient and they expressed understanding.  Patient expressed understanding of the importance of  proper follow up care.   This document serves as a record of services personally performed by Gardiner Sleeper, MD, PhD. It was created on their behalf by San Jetty. Owens Shark, OA an ophthalmic technician. The creation of this record is the provider's dictation and/or activities during the visit.    Electronically signed by: San Jetty. Owens Shark, [place] 04.03.2024 7:50 AM   Gardiner Sleeper, M.D., Ph.D. Diseases & Surgery of the Retina and [place] 10/31/2022   Abbreviations: M myopia (nearsighted); A astigmatism; H hyperopia (farsighted); P presbyopia; Mrx spectacle prescription;  CTL contact lenses; OD right eye; OS left eye; OU both eyes  XT exotropia; ET esotropia; PEK punctate epithelial keratitis; PEE punctate epithelial erosions; DES dry eye syndrome; MGD meibomian gland dysfunction; ATs artificial tears; PFAT's preservative free artificial tears; [place] nuclear sclerotic cataract; PSC posterior subcapsular cataract; ERM epi-retinal membrane; PVD posterior vitreous detachment; RD retinal detachment; DM diabetes mellitus; DR diabetic retinopathy; NPDR non-proliferative diabetic retinopathy; PDR proliferative diabetic retinopathy; CSME clinically significant macular edema; DME diabetic macular edema; dbh dot blot hemorrhages; CWS cotton wool spot; POAG  primary open angle glaucoma; C/D cup-to-disc ratio; HVF humphrey visual field; GVF goldmann visual field; OCT optical coherence tomography; IOP intraocular pressure; BRVO Branch retinal vein occlusion; CRVO central retinal vein occlusion; CRAO central retinal artery occlusion; BRAO branch retinal artery occlusion; RT retinal tear; SB scleral buckle; PPV pars plana vitrectomy; VH Vitreous hemorrhage; PRP panretinal laser photocoagulation; IVK intravitreal kenalog; VMT vitreomacular traction; MH Macular hole;  NVD neovascularization of the disc; NVE neovascularization elsewhere; AREDS age related eye disease study; ARMD age related macular degeneration; POAG primary open angle glaucoma; EBMD epithelial/anterior basement membrane dystrophy; ACIOL anterior chamber intraocular lens; IOL intraocular lens; PCIOL posterior chamber intraocular lens; Phaco/IOL phacoemulsification with intraocular lens placement; [place] photorefractive keratectomy; LASIK laser assisted in situ keratomileusis; HTN hypertension; DM diabetes mellitus; COPD chronic obstructive pulmonary disease

## 2022-10-31 ENCOUNTER — Inpatient Hospital Stay (HOSPITAL_BASED_OUTPATIENT_CLINIC_OR_DEPARTMENT_OTHER): Payer: 59 | Admitting: Hematology & Oncology

## 2022-10-31 ENCOUNTER — Inpatient Hospital Stay: Payer: 59 | Attending: Radiation Oncology

## 2022-10-31 ENCOUNTER — Encounter (INDEPENDENT_AMBULATORY_CARE_PROVIDER_SITE_OTHER): Payer: Self-pay | Admitting: Ophthalmology

## 2022-10-31 ENCOUNTER — Other Ambulatory Visit: Payer: Self-pay

## 2022-10-31 ENCOUNTER — Encounter: Payer: Self-pay | Admitting: Hematology & Oncology

## 2022-10-31 ENCOUNTER — Ambulatory Visit (INDEPENDENT_AMBULATORY_CARE_PROVIDER_SITE_OTHER): Payer: 59 | Admitting: Ophthalmology

## 2022-10-31 ENCOUNTER — Telehealth: Payer: Self-pay

## 2022-10-31 VITALS — BP 148/60 | HR 82 | Temp 98.2°F | Resp 16 | Ht 67.0 in | Wt 113.0 lb

## 2022-10-31 DIAGNOSIS — I509 Heart failure, unspecified: Secondary | ICD-10-CM | POA: Diagnosis not present

## 2022-10-31 DIAGNOSIS — I10 Essential (primary) hypertension: Secondary | ICD-10-CM | POA: Diagnosis not present

## 2022-10-31 DIAGNOSIS — Z8673 Personal history of transient ischemic attack (TIA), and cerebral infarction without residual deficits: Secondary | ICD-10-CM | POA: Insufficient documentation

## 2022-10-31 DIAGNOSIS — F17210 Nicotine dependence, cigarettes, uncomplicated: Secondary | ICD-10-CM | POA: Insufficient documentation

## 2022-10-31 DIAGNOSIS — I130 Hypertensive heart and chronic kidney disease with heart failure and stage 1 through stage 4 chronic kidney disease, or unspecified chronic kidney disease: Secondary | ICD-10-CM | POA: Diagnosis not present

## 2022-10-31 DIAGNOSIS — Z85118 Personal history of other malignant neoplasm of bronchus and lung: Secondary | ICD-10-CM

## 2022-10-31 DIAGNOSIS — Z794 Long term (current) use of insulin: Secondary | ICD-10-CM | POA: Diagnosis not present

## 2022-10-31 DIAGNOSIS — E113413 Type 2 diabetes mellitus with severe nonproliferative diabetic retinopathy with macular edema, bilateral: Secondary | ICD-10-CM | POA: Diagnosis not present

## 2022-10-31 DIAGNOSIS — N189 Chronic kidney disease, unspecified: Secondary | ICD-10-CM

## 2022-10-31 DIAGNOSIS — Z992 Dependence on renal dialysis: Secondary | ICD-10-CM | POA: Diagnosis not present

## 2022-10-31 DIAGNOSIS — C3411 Malignant neoplasm of upper lobe, right bronchus or lung: Secondary | ICD-10-CM

## 2022-10-31 DIAGNOSIS — Z961 Presence of intraocular lens: Secondary | ICD-10-CM

## 2022-10-31 DIAGNOSIS — H35033 Hypertensive retinopathy, bilateral: Secondary | ICD-10-CM

## 2022-10-31 DIAGNOSIS — H25812 Combined forms of age-related cataract, left eye: Secondary | ICD-10-CM | POA: Diagnosis not present

## 2022-10-31 DIAGNOSIS — E1122 Type 2 diabetes mellitus with diabetic chronic kidney disease: Secondary | ICD-10-CM

## 2022-10-31 DIAGNOSIS — H3581 Retinal edema: Secondary | ICD-10-CM

## 2022-10-31 LAB — CMP (CANCER CENTER ONLY)
ALT: 13 U/L (ref 0–44)
AST: 21 U/L (ref 15–41)
Albumin: 3.2 g/dL — ABNORMAL LOW (ref 3.5–5.0)
Alkaline Phosphatase: 90 U/L (ref 38–126)
Anion gap: 14 (ref 5–15)
BUN: 27 mg/dL — ABNORMAL HIGH (ref 6–20)
CO2: 28 mmol/L (ref 22–32)
Calcium: 8.1 mg/dL — ABNORMAL LOW (ref 8.9–10.3)
Chloride: 92 mmol/L — ABNORMAL LOW (ref 98–111)
Creatinine: 4.27 mg/dL — ABNORMAL HIGH (ref 0.44–1.00)
GFR, Estimated: 11 mL/min — ABNORMAL LOW (ref >60)
Glucose, Bld: 477 mg/dL — ABNORMAL HIGH (ref 70–99)
Potassium: 3.9 mmol/L (ref 3.5–5.1)
Sodium: 134 mmol/L — ABNORMAL LOW (ref 135–145)
Total Bilirubin: 0.2 mg/dL — ABNORMAL LOW (ref 0.3–1.2)
Total Protein: 6.1 g/dL — ABNORMAL LOW (ref 6.5–8.1)

## 2022-10-31 LAB — CBC WITH DIFFERENTIAL (CANCER CENTER ONLY)
Abs Immature Granulocytes: 0.02 10*3/uL (ref 0.00–0.07)
Basophils Absolute: 0 10*3/uL (ref 0.0–0.1)
Basophils Relative: 1 %
Eosinophils Absolute: 0 10*3/uL (ref 0.0–0.5)
Eosinophils Relative: 0 %
HCT: 34.3 % — ABNORMAL LOW (ref 36.0–46.0)
Hemoglobin: 10.6 g/dL — ABNORMAL LOW (ref 12.0–15.0)
Immature Granulocytes: 0 %
Lymphocytes Relative: 15 %
Lymphs Abs: 1 10*3/uL (ref 0.7–4.0)
MCH: 28.7 pg (ref 26.0–34.0)
MCHC: 30.9 g/dL (ref 30.0–36.0)
MCV: 93 fL (ref 80.0–100.0)
Monocytes Absolute: 0.5 10*3/uL (ref 0.1–1.0)
Monocytes Relative: 7 %
Neutro Abs: 5.1 10*3/uL (ref 1.7–7.7)
Neutrophils Relative %: 77 %
Platelet Count: 209 10*3/uL (ref 150–400)
RBC: 3.69 MIL/uL — ABNORMAL LOW (ref 3.87–5.11)
RDW: 16.1 % — ABNORMAL HIGH (ref 11.5–15.5)
WBC Count: 6.6 10*3/uL (ref 4.0–10.5)
nRBC: 0 % (ref 0.0–0.2)

## 2022-10-31 LAB — LACTATE DEHYDROGENASE: LDH: 222 U/L — ABNORMAL HIGH (ref 98–192)

## 2022-10-31 MED ORDER — BEVACIZUMAB CHEMO INJECTION 1.25MG/0.05ML SYRINGE FOR KALEIDOSCOPE
1.2500 mg | INTRAVITREAL | Status: AC | PRN
  Administered 2022-10-31: 1.25 mg via INTRAVITREAL

## 2022-10-31 MED ORDER — MIRTAZAPINE 15 MG PO TABS: 15.0000 mg | ORAL_TABLET | Freq: Every day | ORAL | 3 refills | Status: DC

## 2022-10-31 NOTE — Telephone Encounter (Signed)
CSW attempted to contact patient per new patient protocol.  Left vm. 

## 2022-10-31 NOTE — Progress Notes (Signed)
Referral MD  Reason for Referral: Limited stage small cell lung cancer  Chief Complaint  Patient presents with   New Patient (Initial Visit)  : I was told to come here.  HPI: Amber Cross is a very nice 62 year old African-American female.  She has numerous health problems.  The biggest problem right now is that she is on dialysis.  She is on dialysis Monday-Wednesday-Friday.  She has been on dialysis since December..  She has had diabetes for 20 years.  She is on insulin for this..  She does smoke.  She probably has about a 60-pack-year history of tobacco use.  She still smokes.  She apparently was found to have small cell lung cancer last year.  She ultimately had a bronchoscopy with biopsies.  This was done on 12/18/2021.  The pathology report ([id number]) showed small cell lung cancer.  This was a limited stage small cell lung cancer with a very small nodule in the right lung..  She only had treatment with radiosurgery.  She had SBRT.  She had 54Gy in 3 fractions.  This was completed on 01/31/2022.  She did not have any follow-up x-rays.  I am sure that they were probably ordered.  She apparently had a pericardial issue.  She required a pericardial centesis.  This was done on 06/18/2022.  The pathology report [id number] was negative for malignancy.  The last CT scan that I have on her was on 06/17/2022.  This did not show any obvious recurrent disease.  She has some changes in the lung which likely were reflective of tobacco use.  She has some left upper lobe groundglass scattered opacities.  She had slight right and left pleural effusions.  She has stable cardiomegaly.  Her last ejection fraction was done on 06/21/2022.  This showed an LVEF of 45 to 45%.  She lives in [place].  She is related to one of my other patients.  She was told that she needed to come here as we are starting a lot closer than [place].  She has no appetite.  She is lost quite a bit of weight.  She said  that she used to weigh 185 pounds.  She now is down to 113 pounds.  She does smoke marijuana but this does not help her.  Patient has had a stroke.  She apparently had a stroke back in May 2023.  She really does not have a great performance status.  I would have said that her performance status is probably ECOG 2 at best.  I will think she never had a colonoscopy.  She is not sure when she had her last mammogram.  She has been followed [facility].   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   Postop check 08/05/2022   Pseudogout    Stroke 10/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   Tamponade 06/18/2022   Tobacco abuse   :   Past Surgical History:  Procedure Laterality Date   ANTERIOR CRUCIATE LIGAMENT REPAIR Right    AV  FISTULA PLACEMENT Left 07/01/2022   Procedure: LEFT ARTERIOVENOUS (AV) FISTULA CREATION;  Surgeon: Angelia Mould, MD;  Location: [facility];  Service: Vascular;  Laterality: Left;  with regional block   BRONCHIAL BIOPSY  12/18/2021   Procedure: BRONCHIAL BIOPSIES;  Surgeon: Garner Nash, DO;  Location: [facility];  Service: Pulmonary;;   BRONCHIAL BRUSHINGS  12/18/2021   Procedure: BRONCHIAL BRUSHINGS;  Surgeon: Garner Nash, DO;  Location: [facility];  Service: Pulmonary;;   BRONCHIAL NEEDLE ASPIRATION BIOPSY  12/18/2021   Procedure: BRONCHIAL NEEDLE ASPIRATION BIOPSIES;  Surgeon: Garner Nash, DO;  Location: [facility];  Service: Pulmonary;;   CATARACT EXTRACTION     CHEST TUBE INSERTION Right 06/18/2022   Procedure: CHEST TUBE INSERTION;  Surgeon: Dahlia Byes, MD;   Location: [facility];  Service: Thoracic;  Laterality: Right;   EXCHANGE OF A DIALYSIS CATHETER N/A 08/08/2022   Procedure: EXCHANGE OF A TUNNELED DIALYSIS CATHETER;  Surgeon: Serafina Mitchell, MD;  Location: [facility] [place];  Service: Vascular;  Laterality: N/A;   FIDUCIAL MARKER PLACEMENT  12/18/2021   Procedure: FIDUCIAL MARKER PLACEMENT;  Surgeon: Garner Nash, DO;  Location: [facility];  Service: Pulmonary;;   IR FLUORO GUIDE CV LINE RIGHT  06/26/2022   IR US GUIDE VASC ACCESS RIGHT  06/26/2022   OVARY SURGERY     RIGHT OOPHORECTOMY Right    SMALL INTESTINE SURGERY     SUBXYPHOID PERICARDIAL WINDOW N/A 06/18/2022   Procedure: SUBXYPHOID PERICARDIAL WINDOW;  Surgeon: Dahlia Byes, MD;  Location: [facility];  Service: Thoracic;  Laterality: N/A;   TEE WITHOUT CARDIOVERSION N/A 06/18/2022   Procedure: TRANSESOPHAGEAL ECHOCARDIOGRAM (TEE);  Surgeon: Dahlia Byes, MD;  Location: [facility];  Service: Thoracic;  Laterality: N/A;   TUBAL LIGATION     VIDEO BRONCHOSCOPY WITH RADIAL ENDOBRONCHIAL ULTRASOUND  12/18/2021   Procedure: RADIAL ENDOBRONCHIAL ULTRASOUND;  Surgeon: Garner Nash, DO;  [facility];  Service: Pulmonary;;  :   Current Outpatient Medications:    carvedilol (COREG) 12.5 MG tablet, Take 12.5 mg by mouth., Disp: , Rfl:    gabapentin (NEURONTIN) 600 MG tablet, Take 600 mg by mouth daily., Disp: , Rfl:    megestrol (MEGACE) 40 MG tablet, Take 40 mg by mouth daily., Disp: , Rfl:    sacubitril-valsartan (ENTRESTO) 24-26 MG, Take 1 tablet by mouth 2 (two) times daily., Disp: , Rfl:    acetaminophen (TYLENOL) 500 MG tablet, Take 1,000 mg by mouth every 6 (six) hours as needed for mild pain., Disp: , Rfl:    albuterol (VENTOLIN HFA) 108 (90 Base) MCG/ACT inhaler, Inhale 2 puffs into the lungs every 6 (six) hours as needed for wheezing or shortness of breath., Disp: 6.7 g, Rfl: 2   amLODipine (NORVASC) 10 MG tablet, Take 1 tablet (10 mg total) by mouth daily., Disp: 90 tablet, Rfl:  3   aspirin EC 81 MG tablet, Take 1 tablet (81 mg total) by mouth daily. Swallow whole., Disp: 30 tablet, Rfl: 11   atorvastatin (LIPITOR) 80 MG tablet, Take 1 tablet (80 mg total) by mouth daily., Disp: 90 tablet, Rfl: 3   Continuous Blood Gluc Sensor (DEXCOM G7 SENSOR) MISC, Apply each device on your skin every 10 days, Disp: 3 each, Rfl: 2   furosemide (LASIX) 40 MG tablet, Take 1 tablet (40 mg total) by mouth daily as needed (swelling in legs or weight gain)., Disp: 30 tablet, Rfl: 1   hydrALAZINE (APRESOLINE) 50 MG tablet, Take 1 tablet (50 mg total)  by mouth every 8 (eight) hours., Disp: 180 tablet, Rfl: 3   insulin isophane & regular human KwikPen (NOVOLIN 70/30 KWIKPEN) (70-30) 100 UNIT/ML KwikPen, Inject 5 Units into the skin 2 (two) times daily., Disp: 15 mL, Rfl: 1   Insulin Pen Needle (UNIFINE PENTIPS) 32G X 4 MM MISC, Use in the morning and at bedtime, Disp: 100 each, Rfl: 3   sevelamer carbonate (RENVELA) 800 MG tablet, Take 800 mg by mouth 3 (three) times daily with meals., Disp: , Rfl:    traZODone (DESYREL) 50 MG tablet, Take 1 tablet (50 mg total) by mouth at bedtime., Disp: 90 tablet, Rfl: 1   Vitamin D, Ergocalciferol, (DRISDOL) 1.25 MG (50000 UNIT) CAPS capsule, Take 1 capsule (50,000 Units total) by mouth every 7 (seven) days., Disp: 5 capsule, Rfl: 0:  :   Allergies  Allergen Reactions   Penicillins Itching and Other (See Comments)    redness   Strawberry (Diagnostic) Itching and Swelling   Tomato Itching and Swelling  :   Family History  Problem Relation Age of Onset   Diabetes Mother    Hypertension Mother    Hypertension Father   :   Social History   Socioeconomic History   Marital status: Married    Spouse name: Not on file   Number of children: Not on file   Years of education: Not on file   Highest education level: Not on file  Occupational History   Not on file  Tobacco Use   Smoking status: Every Day    Packs/day: 0.50    Years: 40.00     Additional pack years: 0.00    Total pack years: 20.00    Types: Cigarettes   Smokeless tobacco: Never   Tobacco comments:    Cutting back 1/4-1/2 ppd  Vaping Use   Vaping Use: Never used  Substance and Sexual Activity   Alcohol use: Not Currently   Drug use: Yes    Frequency: 4.0 times per week    Types: Marijuana    Comment: smokes every other day   Sexual activity: Not Currently    Birth control/protection: Abstinence  Other Topics Concern   Not on file  Social History Narrative   Not on file   Social Determinants of Health   Financial Resource Strain: Medium Risk (02/28/2022)   Overall Financial Resource Strain (CARDIA)    Difficulty of Paying Living Expenses: Somewhat hard  Food Insecurity: No Food Insecurity (06/16/2022)   Hunger Vital Sign    Worried About Running Out of Food in the Last Year: Never true    Ran Out of Food in the Last Year: Never true  Transportation Needs: Unmet Transportation Needs (09/05/2022)   PRAPARE - Transportation    Lack of Transportation (Medical): Yes    Lack of Transportation (Non-Medical): Yes  Physical Activity: Not on file  Stress: Stress Concern Present (02/28/2022)   [employer]    Feeling of Stress : To some extent  Social Connections: Not on file  Intimate Partner Violence: Not At Risk (06/16/2022)   Humiliation, Afraid, Rape, and Kick questionnaire    Fear of Current or Ex-Partner: No    Emotionally Abused: No    Physically Abused: No    Sexually Abused: No  :  Review of Systems  Constitutional:  Positive for malaise/fatigue and weight loss.  HENT: Negative.    Eyes: Negative.   Respiratory:  Positive for shortness of breath.  Cardiovascular: Negative.   Gastrointestinal: Negative.   Genitourinary: Negative.   Musculoskeletal:  Positive for myalgias.  Skin: Negative.   Neurological:  Positive for weakness.  Endo/Heme/Allergies: Negative.    Psychiatric/Behavioral: Negative.       Exam: Vital signs show temperature of 98.2.  Pulse 82.  Blood pressure 148/60.  Weight is 113 pounds.  @IPVITALS @ Physical Exam Vitals reviewed.  Constitutional:      Comments:  This is a thin [place] American female.  She is in no obvious distress.  She is somewhat fatigued.  She is alert.  HENT:     Head: Normocephalic and atraumatic.  Eyes:     Pupils: Pupils are equal, round, and reactive to light.  Cardiovascular:     Rate and Rhythm: Normal rate and regular rhythm.     Heart sounds: Normal heart sounds.  Pulmonary:     Effort: Pulmonary effort is normal.     Breath sounds: Normal breath sounds.  Abdominal:     General: Bowel sounds are normal.     Palpations: Abdomen is soft.  Musculoskeletal:        General: No tenderness or deformity. Normal range of motion.     Cervical back: Normal range of motion.  Lymphadenopathy:     Cervical: No cervical adenopathy.  Skin:    General: Skin is warm and dry.     Findings: No erythema or rash.  Neurological:     Mental Status: She is alert and oriented to person, place, and time.  Psychiatric:        Behavior: Behavior normal.        Thought Content: Thought content normal.        Judgment: Judgment normal.     Recent Labs    10/31/22 1348  WBC 6.6  HGB 10.6*  HCT 34.3*  PLT 209    Recent Labs    10/31/22 1348  NA 134*  K 3.9  CL 92*  CO2 28  GLUCOSE 477*  BUN 27*  CREATININE 4.27*  CALCIUM 8.1*    Blood smear review: None  Pathology: See above    Assessment and Plan: Amber Cross is a very nice 62 year old Afro-American female.  She has a history of a limited stage small cell lung cancer.  This is truly a very unique situation that she had a very small primary.  She was treated with radiosurgery.  He would have to think though that at some point this cancer will come back.  She is still smoking.  We probably need to try to get a PET scan on her.  It is a  whole lot easier for her to have the PET scan done in [facility].  We will see about set 1 up.  There is just a lot going on with her.  She has a lot of other health issues.  Hopefully, Remeron needed to help with the appetite stimulation.  I just feel bad for her.  I know she has a lot going on.  I did give her a prayer blanket and when she was grateful for.  We likely will have to see her back probably in a month or so for follow-up.

## 2022-11-01 DIAGNOSIS — Z992 Dependence on renal dialysis: Secondary | ICD-10-CM | POA: Diagnosis not present

## 2022-11-01 DIAGNOSIS — N2581 Secondary hyperparathyroidism of renal origin: Secondary | ICD-10-CM | POA: Diagnosis not present

## 2022-11-01 DIAGNOSIS — N186 End stage renal disease: Secondary | ICD-10-CM | POA: Diagnosis not present

## 2022-11-04 DIAGNOSIS — M546 Pain in thoracic spine: Secondary | ICD-10-CM | POA: Diagnosis not present

## 2022-11-04 DIAGNOSIS — Z992 Dependence on renal dialysis: Secondary | ICD-10-CM | POA: Diagnosis not present

## 2022-11-04 DIAGNOSIS — M25511 Pain in right shoulder: Secondary | ICD-10-CM | POA: Diagnosis not present

## 2022-11-04 DIAGNOSIS — N2581 Secondary hyperparathyroidism of renal origin: Secondary | ICD-10-CM | POA: Diagnosis not present

## 2022-11-04 DIAGNOSIS — M542 Cervicalgia: Secondary | ICD-10-CM | POA: Diagnosis not present

## 2022-11-04 DIAGNOSIS — Z87891 Personal history of nicotine dependence: Secondary | ICD-10-CM | POA: Diagnosis not present

## 2022-11-04 DIAGNOSIS — Z041 Encounter for examination and observation following transport accident: Secondary | ICD-10-CM | POA: Diagnosis not present

## 2022-11-04 DIAGNOSIS — N186 End stage renal disease: Secondary | ICD-10-CM | POA: Diagnosis not present

## 2022-11-04 DIAGNOSIS — R519 Headache, unspecified: Secondary | ICD-10-CM | POA: Diagnosis not present

## 2022-11-04 DIAGNOSIS — T1490XA Injury, unspecified, initial encounter: Secondary | ICD-10-CM | POA: Diagnosis not present

## 2022-11-04 DIAGNOSIS — Y92414 Local residential or business street as the place of occurrence of the external cause: Secondary | ICD-10-CM | POA: Diagnosis not present

## 2022-11-06 DIAGNOSIS — N186 End stage renal disease: Secondary | ICD-10-CM | POA: Diagnosis not present

## 2022-11-06 DIAGNOSIS — Z992 Dependence on renal dialysis: Secondary | ICD-10-CM | POA: Diagnosis not present

## 2022-11-06 DIAGNOSIS — N2581 Secondary hyperparathyroidism of renal origin: Secondary | ICD-10-CM | POA: Diagnosis not present

## 2022-11-07 ENCOUNTER — Telehealth: Payer: Self-pay | Admitting: *Deleted
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
## 2022-11-08 ENCOUNTER — Other Ambulatory Visit (INDEPENDENT_AMBULATORY_CARE_PROVIDER_SITE_OTHER): Payer: Self-pay

## 2022-11-08 DIAGNOSIS — N2581 Secondary hyperparathyroidism of renal origin: Secondary | ICD-10-CM | POA: Diagnosis not present

## 2022-11-08 DIAGNOSIS — N186 End stage renal disease: Secondary | ICD-10-CM | POA: Diagnosis not present

## 2022-11-08 DIAGNOSIS — Z992 Dependence on renal dialysis: Secondary | ICD-10-CM | POA: Diagnosis not present

## 2022-11-08 NOTE — Progress Notes (Incomplete)
[facility] - Clinic Note  11/08/2022     CHIEF COMPLAINT Patient presents for No chief complaint on file.   HISTORY OF PRESENT ILLNESS: Amber Cross is a 62 y.o. female who presents to the clinic today for:   Pt is here on the referral of Alma Downs, PA-C for concern of PDR OU, pt states she went to see him bc she felt like her vision was gradually getting blurry, pt states Mathis Fare told her he saw bleeding in the back of her eye, pts last A1c was 7.4 on 03.15.24, pt had 2 strokes last year, she states her blood sugar has fluctuated up and down since the strokes, pt is on dialysis, she takes gabapentin for pain  Referring physician: Alma Downs, PA-C  [facility] [address] [place],  [place] [address]  HISTORICAL INFORMATION:  Selected notes from the MEDICAL RECORD NUMBER Referred by Alma Downs, PA-C for concern of CME / PDR LEE:  Ocular Hx- PMH-   CURRENT MEDICATIONS: No current outpatient medications on file. (Ophthalmic Drugs)   No current facility-administered medications for this visit. (Ophthalmic Drugs)   Current Outpatient Medications (Other)  Medication Sig   acetaminophen (TYLENOL) 500 MG tablet Take 1,000 mg by mouth every 6 (six) hours as needed for mild pain.   albuterol (VENTOLIN HFA) 108 (90 Base) MCG/ACT inhaler Inhale 2 puffs into the lungs every 6 (six) hours as needed for wheezing or shortness of breath.   amLODipine (NORVASC) 10 MG tablet Take 1 tablet (10 mg total) by mouth daily.   aspirin EC 81 MG tablet Take 1 tablet (81 mg total) by mouth daily. Swallow whole.   atorvastatin (LIPITOR) 80 MG tablet Take 1 tablet (80 mg total) by mouth daily.   carvedilol (COREG) 12.5 MG tablet Take 12.5 mg by mouth.   Continuous Blood Gluc Sensor (DEXCOM G7 SENSOR) MISC Apply each device on your skin every 10 days   furosemide (LASIX) 40 MG tablet Take 1 tablet (40 mg total) by mouth daily as needed (swelling  in legs or weight gain).   gabapentin (NEURONTIN) 600 MG tablet Take 600 mg by mouth daily.   hydrALAZINE (APRESOLINE) 50 MG tablet Take 1 tablet (50 mg total) by mouth every 8 (eight) hours.   insulin isophane & regular human KwikPen (NOVOLIN 70/30 KWIKPEN) (70-30) 100 UNIT/ML KwikPen Inject 5 Units into the skin 2 (two) times daily.   Insulin Pen Needle (UNIFINE PENTIPS) 32G X 4 MM MISC Use in the morning and at bedtime   megestrol (MEGACE) 40 MG tablet Take 40 mg by mouth daily.   mirtazapine (REMERON) 15 MG tablet Take 1 tablet (15 mg total) by mouth at bedtime.   sacubitril-valsartan (ENTRESTO) 24-26 MG Take 1 tablet by mouth 2 (two) times daily.   sevelamer carbonate (RENVELA) 800 MG tablet Take 800 mg by mouth 3 (three) times daily with meals.   traZODone (DESYREL) 50 MG tablet Take 1 tablet (50 mg total) by mouth at bedtime.   Vitamin D, Ergocalciferol, (DRISDOL) 1.25 MG (50000 UNIT) CAPS capsule Take 1 capsule (50,000 Units total) by mouth every 7 (seven) days.   No current facility-administered medications for this visit. 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   Postop check 08/05/2022   Pseudogout    Stroke 10/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   Tamponade 06/18/2022   Tobacco abuse    Past Surgical History:  Procedure Laterality Date   ANTERIOR CRUCIATE LIGAMENT REPAIR Right    AV FISTULA  PLACEMENT Left 07/01/2022   Procedure: LEFT ARTERIOVENOUS (AV) FISTULA CREATION;  Surgeon: Chuck Hint, MD;  Location: [facility] OR;  Service: Vascular;  Laterality: Left;  with regional block   BRONCHIAL BIOPSY  12/18/2021   Procedure: BRONCHIAL BIOPSIES;  Surgeon: Josephine Igo, DO;  Location: [facility];  Service: Pulmonary;;   BRONCHIAL BRUSHINGS  12/18/2021   Procedure: BRONCHIAL BRUSHINGS;  Surgeon: Josephine Igo, DO;  Location: [facility];  Service: Pulmonary;;   BRONCHIAL NEEDLE ASPIRATION BIOPSY  12/18/2021   Procedure: BRONCHIAL NEEDLE ASPIRATION BIOPSIES;  Surgeon: Josephine Igo, DO;  Location: [facility];  Service: Pulmonary;;   CATARACT EXTRACTION     CHEST TUBE INSERTION Right 06/18/2022   Procedure: CHEST TUBE INSERTION;  Surgeon: Lovett Sox, MD;  Location: [facility] OR;  Service: Thoracic;  Laterality: Right;   EXCHANGE OF A DIALYSIS CATHETER N/A 08/08/2022   Procedure: EXCHANGE OF A TUNNELED DIALYSIS CATHETER;  Surgeon: Nada Libman, MD;  Location: [facility] OR;  Service: Vascular;  Laterality: N/A;   FIDUCIAL MARKER PLACEMENT  12/18/2021   Procedure: FIDUCIAL MARKER PLACEMENT;  Surgeon: Josephine Igo, DO;  Location: [facility];  Service: Pulmonary;;   IR FLUORO GUIDE CV LINE RIGHT  06/26/2022   IR US GUIDE VASC ACCESS RIGHT  06/26/2022   OVARY SURGERY     RIGHT OOPHORECTOMY Right    SMALL INTESTINE SURGERY     SUBXYPHOID PERICARDIAL WINDOW N/A 06/18/2022   Procedure: SUBXYPHOID PERICARDIAL WINDOW;  Surgeon: Lovett Sox, MD;  Location: [facility] OR;  Service: Thoracic;  Laterality: N/A;   TEE WITHOUT CARDIOVERSION N/A 06/18/2022   Procedure: TRANSESOPHAGEAL ECHOCARDIOGRAM (TEE);  Surgeon: Lovett Sox, MD;  Location: [facility] OR;  Service: Thoracic;  Laterality: N/A;   TUBAL LIGATION     VIDEO BRONCHOSCOPY WITH RADIAL ENDOBRONCHIAL ULTRASOUND  12/18/2021   Procedure: RADIAL ENDOBRONCHIAL ULTRASOUND;  Surgeon: Josephine Igo, DO;  Location: [facility];  Service:  Pulmonary;;    FAMILY HISTORY Family History  Problem Relation Age of Onset   Diabetes Mother    Hypertension Mother    Hypertension Father     SOCIAL HISTORY Social History   Tobacco Use   Smoking status: Every Day    Packs/day: 0.50    Years: 40.00    Additional pack years: 0.00    Total pack years: 20.00    Types: Cigarettes   Smokeless tobacco: Never   Tobacco comments:    Cutting back 1/4-1/2 ppd  Vaping Use   Vaping Use: Never used  Substance Use Topics   Alcohol use: Not Currently   Drug use: Yes    Frequency: 4.0 times per week    Types: Marijuana    Comment: smokes every other day  OPHTHALMIC EXAM:  Not recorded     IMAGING AND PROCEDURES  Imaging and Procedures for 11/08/2022         ASSESSMENT/PLAN:  Severe Non-proliferative diabetic retinopathy, both eyes  - s/p IVA OS #1 (04.04.24)  - last A1c was 7.4 on 3.15.24 - BCVA 20/25 OD, 20/150 OS - exam shows scattered MA/DBH and exudates OU (OS > OD) - FA (04.04.24) shows leaking MA greatest posteriorly, no NV OU - 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 Hypertensive retinopathy OU - discussed importance of tight BP control - monitor  4. Mixed Cataract OS - The symptoms of cataract, surgical options, and treatments and risks were discussed with patient. - discussed diagnosis and progression - under the expert management of [facility] - clear from a retina standpoint to proceed with cataract surgery when pt and surgeon are ready   5. Pseudophakia OD  - s/p CE/IOL (Dr. Dione Booze, 2017)  - IOL in good position, doing well  - monitor  Ophthalmic Meds Ordered this visit:  No orders of the defined types were placed in this  encounter.    No follow-ups on file.  There are no Patient Instructions on file for this visit.  Explained the diagnoses, plan, and follow up with the patient and they expressed understanding.  Patient expressed understanding of the importance of proper follow up care.   This document serves as a record of services personally performed by Karie Chimera, MD, PhD. It was created on their behalf by Glee Arvin. Manson Passey, OA an ophthalmic technician. The creation of this record is the provider's dictation and/or activities during the visit.    Electronically signed by: Glee Arvin. Kristopher Oppenheim 04.12.2024 12:23 PM    Karie Chimera, M.D., Ph.D. Diseases & Surgery of the Retina and Vitreous [facility] 11/08/2022   Abbreviations: M myopia (nearsighted); A astigmatism; H hyperopia (farsighted); P presbyopia; Mrx spectacle prescription;  CTL contact lenses; OD right eye; OS left eye; OU both eyes  XT exotropia; ET esotropia; PEK punctate epithelial keratitis; PEE punctate epithelial erosions; DES dry eye syndrome; MGD meibomian gland dysfunction; ATs artificial tears; PFAT's preservative free artificial tears; NSC nuclear sclerotic cataract; PSC posterior subcapsular cataract; ERM epi-retinal membrane; PVD posterior vitreous detachment; RD retinal detachment; DM diabetes mellitus; DR diabetic retinopathy; NPDR non-proliferative diabetic retinopathy; PDR proliferative diabetic retinopathy; CSME clinically significant macular edema; DME diabetic macular edema; dbh dot blot hemorrhages; CWS cotton wool spot; POAG primary open angle glaucoma; C/D cup-to-disc ratio; HVF humphrey visual field; GVF goldmann visual field; OCT optical coherence tomography; IOP intraocular pressure; BRVO Branch retinal vein occlusion; CRVO central retinal vein occlusion; CRAO central retinal artery occlusion; BRAO branch retinal artery occlusion; RT retinal tear; SB scleral buckle; PPV pars plana vitrectomy; VH  Vitreous hemorrhage; PRP panretinal laser photocoagulation; IVK intravitreal kenalog; VMT vitreomacular traction; MH Macular hole;  NVD neovascularization of the disc; NVE neovascularization elsewhere; AREDS age related eye disease study; ARMD age related macular degeneration; POAG primary open angle glaucoma; EBMD epithelial/anterior basement membrane dystrophy; ACIOL anterior chamber intraocular lens; IOL intraocular lens; PCIOL posterior chamber intraocular lens; Phaco/IOL phacoemulsification with intraocular lens placement; PRK photorefractive keratectomy; LASIK laser assisted in situ keratomileusis;  HTN hypertension; DM diabetes mellitus; COPD chronic obstructive pulmonary disease

## 2022-11-11 DIAGNOSIS — N2581 Secondary hyperparathyroidism of renal origin: Secondary | ICD-10-CM | POA: Diagnosis not present

## 2022-11-11 DIAGNOSIS — N186 End stage renal disease: Secondary | ICD-10-CM | POA: Diagnosis not present

## 2022-11-11 DIAGNOSIS — D689 Coagulation defect, unspecified: Secondary | ICD-10-CM | POA: Diagnosis not present

## 2022-11-11 DIAGNOSIS — Z992 Dependence on renal dialysis: Secondary | ICD-10-CM | POA: Diagnosis not present

## 2022-11-11 DIAGNOSIS — E1122 Type 2 diabetes mellitus with diabetic chronic kidney disease: Secondary | ICD-10-CM | POA: Diagnosis not present

## 2022-11-13 ENCOUNTER — Telehealth: Payer: Self-pay | Admitting: *Deleted

## 2022-11-13 DIAGNOSIS — Z992 Dependence on renal dialysis: Secondary | ICD-10-CM | POA: Diagnosis not present

## 2022-11-13 DIAGNOSIS — N2581 Secondary hyperparathyroidism of renal origin: Secondary | ICD-10-CM | POA: Diagnosis not present

## 2022-11-13 DIAGNOSIS — N186 End stage renal disease: Secondary | ICD-10-CM | POA: Diagnosis not present

## 2022-11-13 DIAGNOSIS — E1122 Type 2 diabetes mellitus with diabetic chronic kidney disease: Secondary | ICD-10-CM | POA: Diagnosis not present

## 2022-11-13 DIAGNOSIS — D689 Coagulation defect, unspecified: Secondary | ICD-10-CM | POA: Diagnosis not present

## 2022-11-13 NOTE — Telephone Encounter (Signed)
Per 10/31/22 los - called patient and lvm of upcoming appointments- requested call back to confirm.

## 2022-11-13 NOTE — Telephone Encounter (Signed)
Per Jarrett Soho - PET Scan -This was denied. An appeal have been sent. Is awaiting response.

## 2022-11-14 NOTE — Progress Notes (Signed)
[facility] - Clinic Note  11/28/2022     CHIEF COMPLAINT Patient presents for Retina Follow Up   HISTORY OF PRESENT ILLNESS: Amber Cross is a 62 y.o. female who presents to the clinic today for:  HPI     Retina Follow Up   Patient presents with  Diabetic Retinopathy.  In both eyes.  This started weeks ago.  I, the attending physician,  performed the HPI with the patient and updated documentation appropriately.        Comments   Patient feels that the vision is doing a little better. She is not using any eye drops at this time. She does not check her sugars daily.       Last edited by Rennis Chris, MD on 11/28/2022 11:28 AM.       Referring physician: Alma Downs, PA-C  [facility] [address] [place],  [place] [address]  HISTORICAL INFORMATION:  Selected notes from the MEDICAL RECORD NUMBER Referred by Alma Downs, PA-C for concern of CME / PDR LEE:  Ocular Hx- PMH-   CURRENT MEDICATIONS: No current outpatient medications on file. (Ophthalmic Drugs)   No current facility-administered medications for this visit. (Ophthalmic Drugs)   Current Outpatient Medications (Other)  Medication Sig   acetaminophen (TYLENOL) 500 MG tablet Take 1,000 mg by mouth every 6 (six) hours as needed for mild pain.   albuterol (VENTOLIN HFA) 108 (90 Base) MCG/ACT inhaler Inhale 2 puffs into the lungs every 6 (six) hours as needed for wheezing or shortness of breath.   amLODipine (NORVASC) 10 MG tablet Take 1 tablet (10 mg total) by mouth daily.   aspirin EC 81 MG tablet Take 1 tablet (81 mg total) by mouth daily. Swallow whole.   atorvastatin (LIPITOR) 80 MG tablet Take 1 tablet (80 mg total) by mouth daily.   carvedilol (COREG) 12.5 MG tablet Take 12.5 mg by mouth.   Continuous Blood Gluc Sensor (DEXCOM G7 SENSOR) MISC Apply each device on your skin every 10 days   furosemide (LASIX) 40 MG tablet Take 1 tablet (40 mg total) by  mouth daily as needed (swelling in legs or weight gain).   gabapentin (NEURONTIN) 600 MG tablet Take 600 mg by mouth daily.   hydrALAZINE (APRESOLINE) 50 MG tablet Take 1 tablet (50 mg total) by mouth every 8 (eight) hours.   insulin isophane & regular human KwikPen (NOVOLIN 70/30 KWIKPEN) (70-30) 100 UNIT/ML KwikPen Inject 5 Units into the skin 2 (two) times daily.   Insulin Pen Needle (UNIFINE PENTIPS) 32G X 4 MM MISC Use in the morning and at bedtime   megestrol (MEGACE) 40 MG tablet Take 40 mg by mouth daily.   mirtazapine (REMERON) 15 MG tablet Take 1 tablet (15 mg total) by mouth at bedtime.   sacubitril-valsartan (ENTRESTO) 24-26 MG Take 1 tablet by mouth 2 (two) times daily.   sevelamer carbonate (RENVELA) 800 MG tablet Take 800 mg by mouth 3 (three) times daily with meals.   traZODone (DESYREL) 50 MG tablet Take 1 tablet (50 mg total) by mouth at bedtime.   Vitamin D, Ergocalciferol, (DRISDOL) 1.25 MG (50000 UNIT) CAPS capsule Take 1 capsule (50,000 Units total) by mouth every 7 (seven) days.   No current facility-administered medications for this visit. (Other)   REVIEW OF SYSTEMS: ROS   Positive for: Musculoskeletal, Endocrine, Cardiovascular, Eyes Negative for: Constitutional, Gastrointestinal, Neurological, Skin, Genitourinary, HENT, Respiratory, Psychiatric, Allergic/Imm, Heme/Lymph Last edited by Gerilyn Nestle  N, COT on 11/28/2022  9:08 AM.      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   Postop check 08/05/2022   Pseudogout    Stroke (HCC) 10/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   Tamponade 06/18/2022   Tobacco abuse    Past Surgical History:  Procedure Laterality Date   ANTERIOR CRUCIATE LIGAMENT REPAIR Right    AV FISTULA PLACEMENT Left 07/01/2022   Procedure: LEFT ARTERIOVENOUS (AV) FISTULA CREATION;  Surgeon: Chuck Hint, MD;  Location: [facility] OR;  Service: Vascular;  Laterality: Left;  with regional block   BRONCHIAL BIOPSY  12/18/2021   Procedure: BRONCHIAL BIOPSIES;  Surgeon: Josephine Igo, DO;  Location: [facility] ENDOSCOPY;  Service: Pulmonary;;   BRONCHIAL BRUSHINGS  12/18/2021   Procedure: BRONCHIAL BRUSHINGS;  Surgeon: Josephine Igo, DO;  Location: [facility];  Service: Pulmonary;;   BRONCHIAL NEEDLE ASPIRATION BIOPSY  12/18/2021   Procedure: BRONCHIAL NEEDLE ASPIRATION BIOPSIES;  Surgeon: Josephine Igo, DO;  Location: [facility];  Service: Pulmonary;;   CATARACT EXTRACTION     CHEST TUBE INSERTION Right 06/18/2022   Procedure: CHEST TUBE INSERTION;  Surgeon: Lovett Sox, MD;  Location: [facility] OR;  Service: Thoracic;  Laterality: Right;   EXCHANGE OF A DIALYSIS CATHETER N/A 08/08/2022   Procedure: EXCHANGE OF A TUNNELED DIALYSIS CATHETER;  Surgeon: Nada Libman, MD;  Location: [facility] OR;  Service: Vascular;  Laterality: N/A;   FIDUCIAL MARKER PLACEMENT  12/18/2021   Procedure: FIDUCIAL MARKER PLACEMENT;  Surgeon: Josephine Igo, DO;  Location: [facility];  Service: Pulmonary;;   IR FLUORO GUIDE CV LINE RIGHT  06/26/2022   IR US GUIDE VASC ACCESS RIGHT  06/26/2022   OVARY SURGERY     RIGHT OOPHORECTOMY Right    SMALL INTESTINE SURGERY     SUBXYPHOID PERICARDIAL WINDOW N/A 06/18/2022   Procedure: SUBXYPHOID PERICARDIAL WINDOW;  Surgeon: Lovett Sox, MD;  Location: [facility] OR;  Service: Thoracic;  Laterality: N/A;   TEE WITHOUT CARDIOVERSION N/A 06/18/2022    Procedure: TRANSESOPHAGEAL ECHOCARDIOGRAM (TEE);  Surgeon: Lovett Sox, MD;  Location: [facility] OR;  Service: Thoracic;  Laterality: N/A;   TUBAL LIGATION     VIDEO BRONCHOSCOPY WITH RADIAL ENDOBRONCHIAL ULTRASOUND  12/18/2021   Procedure: RADIAL ENDOBRONCHIAL ULTRASOUND;  Surgeon: Josephine Igo, DO;  Location: [facility];  Service: Pulmonary;;    FAMILY HISTORY Family History  Problem Relation Age of Onset   Diabetes Mother    Hypertension Mother    Hypertension Father     SOCIAL HISTORY Social History   Tobacco Use   Smoking status: Every Day    Packs/day: 0.50    Years: 40.00    Additional pack years: 0.00    Total pack years: 20.00    Types: Cigarettes   Smokeless tobacco: Never   Tobacco comments:  Cutting back 1/4-1/2 ppd  Vaping Use   Vaping Use: Never used  Substance Use Topics   Alcohol use: Not Currently   Drug use: Yes    Frequency: 4.0 times per week    Types: Marijuana    Comment: smokes every other day       OPHTHALMIC EXAM:  Base Eye Exam     Visual Acuity (Snellen - Linear)       Right Left   Dist cc 20/25 +1 20/150 +1   Dist ph cc NI NI    Correction: Glasses         Tonometry (Tonopen, 9:13 AM)       Right Left   Pressure 17 16         Pupils       Dark Light Shape React APD   Right 3 2 Round Minimal None   Left 4 3 Round Minimal None         Visual Fields       Left Right    Full Full         Extraocular Movement       Right Left    Full, Ortho Full, Ortho         Neuro/Psych     Oriented x3: Yes   Mood/Affect: Normal         Dilation     Both eyes: 1.0% Mydriacyl, 2.5% Phenylephrine @ 9:10 AM           Slit Lamp and Fundus Exam     Slit Lamp Exam       Right Left   Lids/Lashes Dermatochalasis - upper lid, mild MGD Dermatochalasis - upper lid, mild MGD   Conjunctiva/Sclera nasal pingeucula, Melanosis nasal pingeucula, Melanosis   Cornea arcus, well healed cataract wound, nasal LRI, 2+  Punctate epithelial erosions arcus, nasal LRI, 1+ Punctate epithelial erosions, trace tear film debris   Anterior Chamber deep and clear deep and clear   Iris Round and dilated Round and dilated   Lens PC IOL in good position 2+ Nuclear sclerosis with brunescence, 2-3+ Cortical cataract   Anterior Vitreous mild syneresis mild syneresis         Fundus Exam       Right Left   Disc Pink and Sharp, temporal pallor, temporal PPP Pink and Sharp, temporal pallor, temporal PPP, no NVD   C/D Ratio 0.3 0.3   Macula Blunted foveal reflex, scattered MA, DBH and punctate exudates, cystic changes slightly improved Blunted foveal reflex, dense central cluster of exudates, scattered MA, DBH, central edema -- slightly improved   Vessels attenuated, mild tortuosity, no NV attenuated, Tortuous, no NV   Periphery Attached Attached           Refraction     Wearing Rx       Sphere Cylinder Axis   Right -2.50 +0.50 158   Left -3.50 +0.75 151            IMAGING AND PROCEDURES  Imaging and Procedures for 11/28/2022  OCT, Retina - OU - Both Eyes       Right Eye Quality was good. Central Foveal Thickness: 239. Progression has improved. Findings include normal foveal contour, no SRF, intraretinal hyper-reflective material, intraretinal fluid (Interval improvement in scattered IRF / IRHM greatest centrally).   Left Eye Quality was good. Central Foveal Thickness: 228. Progression has improved. Findings include no SRF, abnormal foveal contour, subretinal hyper-reflective material, intraretinal hyper-reflective material, intraretinal fluid, outer retinal  atrophy (Interval improvement in IRF / IRHM greatest inferior macula).   Notes *Images captured and stored on drive  Diagnosis / Impression:  OD: Interval improvement in scattered IRF / IRHM greatest centrally OS: Interval improvement in IRF / IRHM greatest inferior macula  Clinical management:  See below  Abbreviations: NFP - Normal foveal  profile. CME - cystoid macular edema. PED - pigment epithelial detachment. IRF - intraretinal fluid. SRF - subretinal fluid. EZ - ellipsoid zone. ERM - epiretinal membrane. ORA - outer retinal atrophy. ORT - outer retinal tubulation. SRHM - subretinal hyper-reflective material. IRHM - intraretinal hyper-reflective material      Intravitreal Injection, Pharmacologic Agent - OS - Left Eye       Time Out 11/28/2022. 9:42 AM. Confirmed correct patient, procedure, site, and patient consented.   Anesthesia Topical anesthesia was used. Anesthetic medications included Lidocaine 2%, Proparacaine 0.5%.   Procedure Preparation included 5% betadine to ocular surface, eyelid speculum. A supplied needle was used.   Injection: 1.25 mg Bevacizumab 1.25mg /0.35ml   Route: Intravitreal, Site: Left Eye   NDC: P3213405, Lot: [id number], Expiration date: 01/20/2023   Post-op Post injection exam found visual acuity of at least counting fingers. The patient tolerated the procedure well. There were no complications. The patient received written and verbal post procedure care education.      Intravitreal Injection, Pharmacologic Agent - OD - Right Eye       Time Out 11/28/2022. 9:48 AM. Confirmed correct patient, procedure, site, and patient consented.   Anesthesia Topical anesthesia was used. Anesthetic medications included Lidocaine 2%, Proparacaine 0.5%.   Procedure Preparation included 5% betadine to ocular surface, eyelid speculum. A supplied needle was used.   Injection: 1.25 mg Bevacizumab 1.25mg /0.1ml   Route: Intravitreal, Site: Right Eye   NDC: P3213405, Lot: [id number] , Expiration date: 12/21/2022   Post-op Post injection exam found visual acuity of at least counting fingers. The patient tolerated the procedure well. There were no complications. The patient received written and verbal post procedure care education.           ASSESSMENT/PLAN:  1,2 Severe Non-proliferative  diabetic retinopathy, both eyes  - s/p IVA OS #1 (04.04.24)  - last A1c was 7.4 on 3.15.24 - BCVA 20/25 OD, 20/150 OS - exam shows scattered MA/DBH and exudates OU (OS > OD) - FA (04.04.24) shows leaking MA greatest posteriorly, no NV OU - OCT shows OD: Interval improvement in scattered IRF / IRHM greatest centrally; OS: Interval improvement in IRF / IRHM greatest inferior macula - recommend IVA OU (OD #1 and OS #2) today, 05.02.24 for DME - pt wishes to proceed - RBA of procedure discussed, questions answered - IVA informed consent obtained and signed, 04.04.24 (OU) - see procedure note - f/u in 4 wks -- DFE/OCT, possible injection  3,4. Hypertensive retinopathy OU - discussed importance of tight BP control - monitor  5. Mixed Cataract OS - The symptoms of cataract, surgical options, and treatments and risks were discussed with patient. - discussed diagnosis and progression - under the expert management of [facility] - clear from a retina standpoint to proceed with cataract surgery when pt and surgeon are ready   6. Pseudophakia OD  - s/p CE/IOL (Dr. Dione Booze, 2017)  - IOL in good position, doing well  - monitor  Ophthalmic Meds Ordered this visit:  Meds ordered this encounter  Medications   Bevacizumab (AVASTIN) SOLN 1.25 mg   Bevacizumab (AVASTIN) SOLN 1.25 mg     Return in  about 4 weeks (around 12/26/2022) for f/u NPDR OU, DFE, OCT.  There are no Patient Instructions on file for this visit.  Explained the diagnoses, plan, and follow up with the patient and they expressed understanding.  Patient expressed understanding of the importance of proper follow up care.   This document serves as a record of services personally performed by Karie Chimera, MD, PhD. It was created on their behalf by Glee Arvin. Manson Passey, OA an ophthalmic technician. The creation of this record is the provider's dictation and/or activities during the visit.    Electronically signed by: Glee Arvin.  Kristopher Oppenheim 04.12.2024 11:29 AM  Karie Chimera, M.D., Ph.D. Diseases & Surgery of the Retina and Vitreous [facility] 11/28/2022  I have reviewed the above documentation for accuracy and completeness, and I agree with the above. Karie Chimera, M.D., Ph.D. 11/28/22 11:34 AM   Abbreviations: M myopia (nearsighted); A astigmatism; H hyperopia (farsighted); P presbyopia; Mrx spectacle prescription;  CTL contact lenses; OD right eye; OS left eye; OU both eyes  XT exotropia; ET esotropia; PEK punctate epithelial keratitis; PEE punctate epithelial erosions; DES dry eye syndrome; MGD meibomian gland dysfunction; ATs artificial tears; PFAT's preservative free artificial tears; NSC nuclear sclerotic cataract; PSC posterior subcapsular cataract; ERM epi-retinal membrane; PVD posterior vitreous detachment; RD retinal detachment; DM diabetes mellitus; DR diabetic retinopathy; NPDR non-proliferative diabetic retinopathy; PDR proliferative diabetic retinopathy; CSME clinically significant macular edema; DME diabetic macular edema; dbh dot blot hemorrhages; CWS cotton wool spot; POAG primary open angle glaucoma; C/D cup-to-disc ratio; HVF humphrey visual field; GVF goldmann visual field; OCT optical coherence tomography; IOP intraocular pressure; BRVO Branch retinal vein occlusion; CRVO central retinal vein occlusion; CRAO central retinal artery occlusion; BRAO branch retinal artery occlusion; RT retinal tear; SB scleral buckle; PPV pars plana vitrectomy; VH Vitreous hemorrhage; PRP panretinal laser photocoagulation; IVK intravitreal kenalog; VMT vitreomacular traction; MH Macular hole;  NVD neovascularization of the disc; NVE neovascularization elsewhere; AREDS age related eye disease study; ARMD age related macular degeneration; POAG primary open angle glaucoma; EBMD epithelial/anterior basement membrane dystrophy; ACIOL anterior chamber intraocular lens; IOL intraocular lens; PCIOL posterior  chamber intraocular lens; Phaco/IOL phacoemulsification with intraocular lens placement; PRK photorefractive keratectomy; LASIK laser assisted in situ keratomileusis; HTN hypertension; DM diabetes mellitus; COPD chronic obstructive pulmonary disease

## 2022-11-15 DIAGNOSIS — D689 Coagulation defect, unspecified: Secondary | ICD-10-CM | POA: Diagnosis not present

## 2022-11-15 DIAGNOSIS — E1122 Type 2 diabetes mellitus with diabetic chronic kidney disease: Secondary | ICD-10-CM | POA: Diagnosis not present

## 2022-11-15 DIAGNOSIS — N2581 Secondary hyperparathyroidism of renal origin: Secondary | ICD-10-CM | POA: Diagnosis not present

## 2022-11-15 DIAGNOSIS — Z992 Dependence on renal dialysis: Secondary | ICD-10-CM | POA: Diagnosis not present

## 2022-11-15 DIAGNOSIS — N186 End stage renal disease: Secondary | ICD-10-CM | POA: Diagnosis not present

## 2022-11-18 DIAGNOSIS — N2581 Secondary hyperparathyroidism of renal origin: Secondary | ICD-10-CM | POA: Diagnosis not present

## 2022-11-18 DIAGNOSIS — N186 End stage renal disease: Secondary | ICD-10-CM | POA: Diagnosis not present

## 2022-11-18 DIAGNOSIS — Z992 Dependence on renal dialysis: Secondary | ICD-10-CM | POA: Diagnosis not present

## 2022-11-20 DIAGNOSIS — N2581 Secondary hyperparathyroidism of renal origin: Secondary | ICD-10-CM | POA: Diagnosis not present

## 2022-11-20 DIAGNOSIS — N186 End stage renal disease: Secondary | ICD-10-CM | POA: Diagnosis not present

## 2022-11-20 DIAGNOSIS — Z992 Dependence on renal dialysis: Secondary | ICD-10-CM | POA: Diagnosis not present

## 2022-11-22 DIAGNOSIS — Z992 Dependence on renal dialysis: Secondary | ICD-10-CM | POA: Diagnosis not present

## 2022-11-22 DIAGNOSIS — N2581 Secondary hyperparathyroidism of renal origin: Secondary | ICD-10-CM | POA: Diagnosis not present

## 2022-11-22 DIAGNOSIS — N186 End stage renal disease: Secondary | ICD-10-CM | POA: Diagnosis not present

## 2022-11-25 DIAGNOSIS — N186 End stage renal disease: Secondary | ICD-10-CM | POA: Diagnosis not present

## 2022-11-25 DIAGNOSIS — N2581 Secondary hyperparathyroidism of renal origin: Secondary | ICD-10-CM | POA: Diagnosis not present

## 2022-11-25 DIAGNOSIS — Z992 Dependence on renal dialysis: Secondary | ICD-10-CM | POA: Diagnosis not present

## 2022-11-26 DIAGNOSIS — Z992 Dependence on renal dialysis: Secondary | ICD-10-CM | POA: Diagnosis not present

## 2022-11-26 DIAGNOSIS — I129 Hypertensive chronic kidney disease with stage 1 through stage 4 chronic kidney disease, or unspecified chronic kidney disease: Secondary | ICD-10-CM | POA: Diagnosis not present

## 2022-11-26 DIAGNOSIS — N186 End stage renal disease: Secondary | ICD-10-CM | POA: Diagnosis not present

## 2022-11-27 DIAGNOSIS — Z992 Dependence on renal dialysis: Secondary | ICD-10-CM | POA: Diagnosis not present

## 2022-11-27 DIAGNOSIS — I129 Hypertensive chronic kidney disease with stage 1 through stage 4 chronic kidney disease, or unspecified chronic kidney disease: Secondary | ICD-10-CM | POA: Diagnosis not present

## 2022-11-27 DIAGNOSIS — N186 End stage renal disease: Secondary | ICD-10-CM | POA: Diagnosis not present

## 2022-11-27 DIAGNOSIS — N2581 Secondary hyperparathyroidism of renal origin: Secondary | ICD-10-CM | POA: Diagnosis not present

## 2022-11-28 ENCOUNTER — Encounter (INDEPENDENT_AMBULATORY_CARE_PROVIDER_SITE_OTHER): Payer: Self-pay | Admitting: Ophthalmology

## 2022-11-28 ENCOUNTER — Ambulatory Visit (INDEPENDENT_AMBULATORY_CARE_PROVIDER_SITE_OTHER): Payer: 59 | Admitting: Ophthalmology

## 2022-11-28 DIAGNOSIS — Z794 Long term (current) use of insulin: Secondary | ICD-10-CM | POA: Diagnosis not present

## 2022-11-28 DIAGNOSIS — E119 Type 2 diabetes mellitus without complications: Secondary | ICD-10-CM

## 2022-11-28 DIAGNOSIS — Z961 Presence of intraocular lens: Secondary | ICD-10-CM | POA: Diagnosis not present

## 2022-11-28 DIAGNOSIS — H35033 Hypertensive retinopathy, bilateral: Secondary | ICD-10-CM | POA: Diagnosis not present

## 2022-11-28 DIAGNOSIS — E113413 Type 2 diabetes mellitus with severe nonproliferative diabetic retinopathy with macular edema, bilateral: Secondary | ICD-10-CM | POA: Diagnosis not present

## 2022-11-28 DIAGNOSIS — I10 Essential (primary) hypertension: Secondary | ICD-10-CM

## 2022-11-28 DIAGNOSIS — H25812 Combined forms of age-related cataract, left eye: Secondary | ICD-10-CM

## 2022-11-28 MED ORDER — BEVACIZUMAB CHEMO INJECTION 1.25MG/0.05ML SYRINGE FOR KALEIDOSCOPE
1.2500 mg | INTRAVITREAL | Status: AC | PRN
Start: 2022-11-28 — End: 2022-11-28
  Administered 2022-11-28: 1.25 mg via INTRAVITREAL

## 2022-11-29 DIAGNOSIS — N186 End stage renal disease: Secondary | ICD-10-CM | POA: Diagnosis not present

## 2022-11-29 DIAGNOSIS — Z992 Dependence on renal dialysis: Secondary | ICD-10-CM | POA: Diagnosis not present

## 2022-11-29 DIAGNOSIS — N2581 Secondary hyperparathyroidism of renal origin: Secondary | ICD-10-CM | POA: Diagnosis not present

## 2022-12-02 DIAGNOSIS — N2581 Secondary hyperparathyroidism of renal origin: Secondary | ICD-10-CM | POA: Diagnosis not present

## 2022-12-02 DIAGNOSIS — Z992 Dependence on renal dialysis: Secondary | ICD-10-CM | POA: Diagnosis not present

## 2022-12-02 DIAGNOSIS — N186 End stage renal disease: Secondary | ICD-10-CM | POA: Diagnosis not present

## 2022-12-04 DIAGNOSIS — Z992 Dependence on renal dialysis: Secondary | ICD-10-CM | POA: Diagnosis not present

## 2022-12-04 DIAGNOSIS — N186 End stage renal disease: Secondary | ICD-10-CM | POA: Diagnosis not present

## 2022-12-04 DIAGNOSIS — N2581 Secondary hyperparathyroidism of renal origin: Secondary | ICD-10-CM | POA: Diagnosis not present

## 2022-12-05 ENCOUNTER — Encounter: Payer: Self-pay | Admitting: Student

## 2022-12-05 ENCOUNTER — Ambulatory Visit (INDEPENDENT_AMBULATORY_CARE_PROVIDER_SITE_OTHER): Payer: Medicare Other | Admitting: Student

## 2022-12-05 VITALS — BP 167/61 | HR 75 | Temp 97.8°F | Wt 114.3 lb

## 2022-12-05 DIAGNOSIS — Z794 Long term (current) use of insulin: Secondary | ICD-10-CM

## 2022-12-05 DIAGNOSIS — N186 End stage renal disease: Secondary | ICD-10-CM

## 2022-12-05 DIAGNOSIS — Z1159 Encounter for screening for other viral diseases: Secondary | ICD-10-CM | POA: Diagnosis not present

## 2022-12-05 DIAGNOSIS — E1142 Type 2 diabetes mellitus with diabetic polyneuropathy: Secondary | ICD-10-CM

## 2022-12-05 DIAGNOSIS — C3411 Malignant neoplasm of upper lobe, right bronchus or lung: Secondary | ICD-10-CM | POA: Diagnosis not present

## 2022-12-05 DIAGNOSIS — E1122 Type 2 diabetes mellitus with diabetic chronic kidney disease: Secondary | ICD-10-CM | POA: Diagnosis not present

## 2022-12-05 DIAGNOSIS — Z1231 Encounter for screening mammogram for malignant neoplasm of breast: Secondary | ICD-10-CM

## 2022-12-05 DIAGNOSIS — Z0000 Encounter for general adult medical examination without abnormal findings: Secondary | ICD-10-CM

## 2022-12-05 DIAGNOSIS — I120 Hypertensive chronic kidney disease with stage 5 chronic kidney disease or end stage renal disease: Secondary | ICD-10-CM

## 2022-12-05 DIAGNOSIS — Z992 Dependence on renal dialysis: Secondary | ICD-10-CM | POA: Diagnosis not present

## 2022-12-05 DIAGNOSIS — I10 Essential (primary) hypertension: Secondary | ICD-10-CM

## 2022-12-05 DIAGNOSIS — E041 Nontoxic single thyroid nodule: Secondary | ICD-10-CM

## 2022-12-05 LAB — POCT GLYCOSYLATED HEMOGLOBIN (HGB A1C): Hemoglobin A1C: 10.4 % — AB (ref 4.0–5.6)

## 2022-12-05 LAB — GLUCOSE, CAPILLARY: Glucose-Capillary: 339 mg/dL — ABNORMAL HIGH (ref 70–99)

## 2022-12-05 MED ORDER — INSULIN GLARGINE-YFGN 100 UNIT/ML ~~LOC~~ SOPN
10.0000 [IU] | PEN_INJECTOR | Freq: Every day | SUBCUTANEOUS | 5 refills | Status: DC
Start: 2022-12-05 — End: 2022-12-15

## 2022-12-05 MED ORDER — UNIFINE PENTIPS 32G X 4 MM MISC
1.0000 [IU] | Freq: Two times a day (BID) | 3 refills | Status: DC
Start: 2022-12-05 — End: 2023-11-04

## 2022-12-05 MED ORDER — INSULIN GLARGINE 100 UNITS/ML SOLOSTAR PEN
10.0000 [IU] | PEN_INJECTOR | Freq: Every day | SUBCUTANEOUS | 11 refills | Status: DC
Start: 2022-12-05 — End: 2022-12-05

## 2022-12-05 MED ORDER — SITAGLIPTIN 25 MG PO TABS
25.0000 mg | ORAL_TABLET | Freq: Every day | ORAL | 2 refills | Status: DC
Start: 2022-12-05 — End: 2023-03-12

## 2022-12-05 MED ORDER — LOSARTAN POTASSIUM 25 MG PO TABS
25.0000 mg | ORAL_TABLET | Freq: Every day | ORAL | 11 refills | Status: DC
Start: 2022-12-05 — End: 2023-03-11

## 2022-12-05 NOTE — Assessment & Plan Note (Signed)
Patient initial blood pressure 149/55, repeat 167/61.  Current regimen includes amlodipine 10 mg, hydralazine 50 mg 3 times daily, Coreg 12.5 mg twice daily and Entresto 24/26 twice daily.  She states that Sherryll Burger was started, most recent [facility] for acute [facility] failure from volume overload.  Patient ran out of her Entresto yesterday and cannot afford the co-pay.  She took all of her other blood pressure medications this morning.  Her last HD was yesterday and she appears euvolemic on exam.  -Will stop Entresto and start losartan 25 mg -Continue amlodipine, hydralazine and Coreg -I will notified her nephrologist, Dr. Juel Burrow. -Follow-up in 3 weeks for blood pressure recheck and BMP

## 2022-12-05 NOTE — Patient Instructions (Addendum)
Amber Cross,   It was nice seeing you in the clinic today.  Here summary what we talked about:  1.  Your blood pressure is high today.  Please stop taking Entresto and start losartan 25 mg daily.  I will also let your kidney doctor know.  2.  Your A1c is 10.4 which is very high.  Please start using Lantus 10 units once a day and Sitagliptin and 25 mg daily.  Please check your blood sugar fasting in the morning daily.  3.  For your thyroid nodule, I will obtain an ultrasound with some blood work.  Please return in 4 weeks.  Please bring your glucometer with you.  Dr. Cyndie Chime

## 2022-12-05 NOTE — Assessment & Plan Note (Signed)
-  Check hep C  -Order mammogram

## 2022-12-05 NOTE — Assessment & Plan Note (Signed)
Patient was diagnosed in 2023 and underwent radiosurgery.  She is followed with Dr. Myna Hidalgo with oncology who ordered a PET scan and schedule a follow-up after that.  She is currently not receiving any chemo or radiation therapy.

## 2022-12-05 NOTE — Assessment & Plan Note (Addendum)
Her diabetes was controlled with A1c 6.7 6 months ago.  Patient however has not been using her Novolin 70/30 for many months due to injection form and BID dosing.  Patient is asking about an oral medication instead.  Repeat A1c is 10.4 today.  Her A1c may be underestimated in the setting of chronic anemia and ESRD.  -Patient agrees with starting a once a day long-acting Semglee 10 units daily -Avoid metformin and SGLT2 inhibitor in setting of ESRD.  Her weight is too low to start GLP-1. -Start Sitagliptin 25 mg daily, renally dosed. -Follow-up in 4 weeks for CBG check
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
## 2022-12-05 NOTE — Assessment & Plan Note (Signed)
Patient appears euvolemic on exam.  Continue current hemodialysis schedule Monday Wednesday Friday

## 2022-12-06 DIAGNOSIS — Z992 Dependence on renal dialysis: Secondary | ICD-10-CM | POA: Diagnosis not present

## 2022-12-06 DIAGNOSIS — N186 End stage renal disease: Secondary | ICD-10-CM | POA: Diagnosis not present

## 2022-12-06 DIAGNOSIS — N2581 Secondary hyperparathyroidism of renal origin: Secondary | ICD-10-CM | POA: Diagnosis not present

## 2022-12-07 LAB — T3: T3, Total: 87 ng/dL (ref 71–180)

## 2022-12-07 LAB — HCV INTERPRETATION

## 2022-12-07 LAB — HCV AB W REFLEX TO QUANT PCR: HCV Ab: NONREACTIVE

## 2022-12-07 LAB — TSH: TSH: 0.528 u[IU]/mL (ref 0.450–4.500)

## 2022-12-07 LAB — T4, FREE: Free T4: 1.34 ng/dL (ref 0.82–1.77)

## 2022-12-09 DIAGNOSIS — E1122 Type 2 diabetes mellitus with diabetic chronic kidney disease: Secondary | ICD-10-CM | POA: Diagnosis not present

## 2022-12-09 DIAGNOSIS — N186 End stage renal disease: Secondary | ICD-10-CM | POA: Diagnosis not present

## 2022-12-09 DIAGNOSIS — Z992 Dependence on renal dialysis: Secondary | ICD-10-CM | POA: Diagnosis not present

## 2022-12-09 DIAGNOSIS — N2581 Secondary hyperparathyroidism of renal origin: Secondary | ICD-10-CM | POA: Diagnosis not present

## 2022-12-09 NOTE — Progress Notes (Signed)
Internal Medicine Clinic Attending  Case discussed with Dr. Nguyen  At the time of the visit.  We reviewed the resident's history and exam and pertinent patient test results.  I agree with the assessment, diagnosis, and plan of care documented in the resident's note. 

## 2022-12-11 ENCOUNTER — Inpatient Hospital Stay: Payer: 59 | Admitting: Hematology & Oncology

## 2022-12-11 ENCOUNTER — Inpatient Hospital Stay: Payer: 59 | Attending: Radiation Oncology

## 2022-12-11 DIAGNOSIS — N2581 Secondary hyperparathyroidism of renal origin: Secondary | ICD-10-CM | POA: Diagnosis not present

## 2022-12-11 DIAGNOSIS — Z992 Dependence on renal dialysis: Secondary | ICD-10-CM | POA: Diagnosis not present

## 2022-12-11 DIAGNOSIS — N186 End stage renal disease: Secondary | ICD-10-CM | POA: Diagnosis not present

## 2022-12-11 DIAGNOSIS — E1122 Type 2 diabetes mellitus with diabetic chronic kidney disease: Secondary | ICD-10-CM | POA: Diagnosis not present

## 2022-12-12 NOTE — Progress Notes (Signed)
[facility] - Clinic Note  12/26/2022     CHIEF COMPLAINT Patient presents for Retina Follow Up   HISTORY OF PRESENT ILLNESS: Amber Cross is a 62 y.o. female who presents to the clinic today for:  HPI     Retina Follow Up   Patient presents with  Diabetic Retinopathy.  In both eyes.  This started 4 weeks ago.  Duration of 4 weeks.  Since onset it is stable.  I, the attending physician,  performed the HPI with the patient and updated documentation appropriately.        Comments   4 week retina follow up and IVA OU pt is reporting no vision changes noticed she denies any flashes or floaters she is not really checking her blood sugar at this time       Last edited by Rennis Chris, MD on 12/26/2022  4:46 PM.    Pt states she had no problems with the injections at last visit, she states her BP is under control, but her blood sugar is "all over the place"  Referring physician: Alma Downs, PA-C  [facility] [address] [place],  [place] [address]  HISTORICAL INFORMATION:  Selected notes from the MEDICAL RECORD NUMBER Referred by Alma Downs, PA-C for concern of CME / PDR LEE:  Ocular Hx- PMH-   CURRENT MEDICATIONS: No current outpatient medications on file. (Ophthalmic Drugs)   No current facility-administered medications for this visit. (Ophthalmic Drugs)   Current Outpatient Medications (Other)  Medication Sig   acetaminophen (TYLENOL) 500 MG tablet Take 1,000 mg by mouth every 6 (six) hours as needed for mild pain.   albuterol (VENTOLIN HFA) 108 (90 Base) MCG/ACT inhaler Inhale 2 puffs into the lungs every 6 (six) hours as needed for wheezing or shortness of breath.   amLODipine (NORVASC) 10 MG tablet Take 1 tablet (10 mg total) by mouth daily.   aspirin EC 81 MG tablet Take 1 tablet (81 mg total) by mouth daily. Swallow whole.   atorvastatin (LIPITOR) 80 MG tablet Take 1 tablet (80 mg total) by mouth daily.    carvedilol (COREG) 12.5 MG tablet Take 12.5 mg by mouth.   Continuous Blood Gluc Sensor (DEXCOM G7 SENSOR) MISC Apply each device on your skin every 10 days   furosemide (LASIX) 40 MG tablet Take 1 tablet (40 mg total) by mouth daily as needed (swelling in legs or weight gain).   gabapentin (NEURONTIN) 600 MG tablet Take 600 mg by mouth daily.   hydrALAZINE (APRESOLINE) 50 MG tablet Take 1 tablet (50 mg total) by mouth every 8 (eight) hours.   Insulin Glargine (BASAGLAR KWIKPEN) 100 UNIT/ML Inject 10 Units into the skin daily.   Insulin Pen Needle (UNIFINE PENTIPS) 32G X 4 MM MISC Use in the morning and at bedtime   losartan (COZAAR) 25 MG tablet Take 1 tablet (25 mg total) by mouth daily.   megestrol (MEGACE) 40 MG tablet Take 40 mg by mouth daily.   mirtazapine (REMERON) 15 MG tablet Take 1 tablet (15 mg total) by mouth at bedtime.   sevelamer carbonate (RENVELA) 800 MG tablet Take 800 mg by mouth 3 (three) times daily with meals.   SITagliptin 25 MG TABS Take 25 mg by mouth daily before breakfast.   traZODone (DESYREL) 50 MG tablet Take 1 tablet (50 mg total) by mouth at bedtime.   Vitamin D, Ergocalciferol, (DRISDOL) 1.25 MG (50000 UNIT) CAPS capsule Take 1 capsule (50,000  Units total) by mouth every 7 (seven) days.   No current facility-administered medications for this visit. (Other)   REVIEW OF SYSTEMS: ROS   Positive for: Musculoskeletal, Endocrine, Cardiovascular, Eyes Negative for: Constitutional, Gastrointestinal, Neurological, Skin, Genitourinary, HENT, Respiratory, Psychiatric, Allergic/Imm, Heme/Lymph Last edited by Etheleen Mayhew, COT on 12/26/2022  9:03 AM.     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   Postop check 08/05/2022   Pseudogout    Stroke (HCC) 10/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   Tamponade 06/18/2022   Tobacco abuse    Past Surgical History:  Procedure Laterality Date   ANTERIOR CRUCIATE LIGAMENT REPAIR Right    AV FISTULA PLACEMENT Left 07/01/2022   Procedure: LEFT ARTERIOVENOUS (AV) FISTULA CREATION;  Surgeon: Chuck Hint, MD;  Location: [facility] OR;  Service: Vascular;  Laterality: Left;  with regional block   BRONCHIAL BIOPSY  12/18/2021   Procedure: BRONCHIAL BIOPSIES;  Surgeon: Josephine Igo, DO;  Location: [facility] ENDOSCOPY;  Service: Pulmonary;;   BRONCHIAL BRUSHINGS  12/18/2021   Procedure: BRONCHIAL BRUSHINGS;  Surgeon: Josephine Igo, DO;  Location: [facility];  Service: Pulmonary;;   BRONCHIAL NEEDLE ASPIRATION BIOPSY  12/18/2021   Procedure: BRONCHIAL NEEDLE ASPIRATION BIOPSIES;  Surgeon: Josephine Igo, DO;  Location: [facility];  Service: Pulmonary;;   CATARACT EXTRACTION     CHEST TUBE INSERTION Right 06/18/2022   Procedure: CHEST TUBE INSERTION;  Surgeon: Lovett Sox, MD;  Location: [facility] OR;  Service: Thoracic;  Laterality: Right;   EXCHANGE OF A DIALYSIS CATHETER N/A 08/08/2022   Procedure: EXCHANGE OF A TUNNELED DIALYSIS CATHETER;  Surgeon: Nada Libman, MD;  Location: [facility] OR;  Service: Vascular;  Laterality: N/A;   FIDUCIAL MARKER PLACEMENT  12/18/2021   Procedure: FIDUCIAL MARKER PLACEMENT;  Surgeon: Josephine Igo, DO;  Location: [facility];  Service: Pulmonary;;   IR FLUORO GUIDE CV LINE RIGHT  06/26/2022   IR US GUIDE VASC ACCESS RIGHT  06/26/2022   OVARY SURGERY     RIGHT OOPHORECTOMY Right    SMALL INTESTINE SURGERY     SUBXYPHOID  PERICARDIAL WINDOW N/A 06/18/2022   Procedure: SUBXYPHOID PERICARDIAL WINDOW;  Surgeon: Lovett Sox, MD;  Location: [facility] OR;  Service: Thoracic;  Laterality: N/A;   TEE WITHOUT CARDIOVERSION N/A 06/18/2022   Procedure: TRANSESOPHAGEAL ECHOCARDIOGRAM (TEE);  Surgeon: Lovett Sox, MD;  Location: [facility] OR;  Service: Thoracic;  Laterality: N/A;   TUBAL LIGATION     VIDEO BRONCHOSCOPY WITH RADIAL ENDOBRONCHIAL ULTRASOUND  12/18/2021   Procedure: RADIAL ENDOBRONCHIAL ULTRASOUND;  Surgeon: Josephine Igo, DO;  Location: [facility];  Service: Pulmonary;;   FAMILY HISTORY Family History  Problem Relation Age of Onset   Diabetes Mother    Hypertension Mother    Hypertension Father    SOCIAL HISTORY Social History   Tobacco Use  Smoking status: Every Day    Packs/day: 0.50    Years: 40.00    Additional pack years: 0.00    Total pack years: 20.00    Types: Cigarettes   Smokeless tobacco: Never   Tobacco comments:    Cutting back 1/4-1/2 ppd  Vaping Use   Vaping Use: Never used  Substance Use Topics   Alcohol use: Not Currently   Drug use: Yes    Frequency: 4.0 times per week    Types: Marijuana    Comment: smokes every other day      OPHTHALMIC EXAM:  Base Eye Exam     Visual Acuity (Snellen - Linear)       Right Left   Dist cc 20/25 -1 20/200 -1   Dist ph cc NI NI    Correction: Glasses         Tonometry (Tonopen, 9:07 AM)       Right Left   Pressure 14 13         Pupils       Pupils Dark Light Shape React APD   Right PERRL 3 2 Round Brisk None   Left PERRL 3 2 Round Brisk None         Visual Fields       Left Right    Full Full         Extraocular Movement       Right Left    Full, Ortho Full, Ortho         Neuro/Psych     Oriented x3: Yes   Mood/Affect: Normal         Dilation     Both eyes: 2.5% Phenylephrine @ 9:07 AM           Slit Lamp and Fundus Exam     Slit Lamp Exam       Right Left   Lids/Lashes  Dermatochalasis - upper lid, mild MGD Dermatochalasis - upper lid, mild MGD   Conjunctiva/Sclera nasal pingeucula, Melanosis nasal pingeucula, Melanosis   Cornea arcus, well healed cataract wound, nasal LRI, 2+ Punctate epithelial erosions arcus, nasal LRI, 1+ Punctate epithelial erosions, trace tear film debris   Anterior Chamber deep and clear deep and clear   Iris Round and dilated Round and dilated   Lens PC IOL in good position 2+ Nuclear sclerosis with brunescence, 2-3+ Cortical cataract   Anterior Vitreous mild syneresis mild syneresis         Fundus Exam       Right Left   Disc Pink and Sharp, temporal pallor, temporal PPP Pink and Sharp, temporal pallor, temporal PPP, no NVD   C/D Ratio 0.3 0.3   Macula Blunted foveal reflex, scattered MA, DBH and punctate exudates, cystic changes -- persistent Blunted foveal reflex, dense central cluster of exudates -- slightly improved, scattered MA, DBH, central edema -- slightly improved   Vessels attenuated, Tortuous, no NV attenuated, Tortuous, no NV   Periphery Attached Attached           Refraction     Wearing Rx       Sphere Cylinder Axis   Right -2.50 +0.50 158   Left -3.50 +0.75 151           IMAGING AND PROCEDURES  Imaging and Procedures for 12/26/2022  OCT, Retina - OU - Both Eyes       Right Eye Quality was good. Central Foveal Thickness: 238. Progression has worsened. Findings include normal foveal contour, no  SRF, intraretinal hyper-reflective material, intraretinal fluid (persistent IRF / IRHM greatest centrally -- slightly increased).   Left Eye Quality was good. Central Foveal Thickness: 216. Progression has improved. Findings include no SRF, abnormal foveal contour, subretinal hyper-reflective material, intraretinal hyper-reflective material, intraretinal fluid, outer retinal atrophy (Persistent IRF -- greatest inferior macula, mild interval improvement in [facility] -- greatest superior macula).    Notes *Images captured and stored on drive  Diagnosis / Impression:  OD: persistent IRF / IRHM greatest centrally -- slightly increased OS: Persistent IRF -- greatest inferior macula; mild interval improvement in [facility] -- greatest superior macula  Clinical management:  See below  Abbreviations: NFP - Normal foveal profile. CME - cystoid macular edema. PED - pigment epithelial detachment. IRF - intraretinal fluid. SRF - subretinal fluid. EZ - ellipsoid zone. ERM - epiretinal membrane. ORA - outer retinal atrophy. ORT - outer retinal tubulation. SRHM - subretinal hyper-reflective material. IRHM - intraretinal hyper-reflective material      Intravitreal Injection, Pharmacologic Agent - OD - Right Eye       Time Out 12/26/2022. 9:43 AM. Confirmed correct patient, procedure, site, and patient consented.   Anesthesia Topical anesthesia was used. Anesthetic medications included Lidocaine 2%, Proparacaine 0.5%.   Procedure Preparation included 5% betadine to ocular surface, eyelid speculum. A supplied needle was used.   Injection: 1.25 mg Bevacizumab 1.25mg /0.20ml   Route: Intravitreal, Site: Right Eye   NDC: P3213405, Lot: [id number], Expiration date: 01/18/2023   Post-op Post injection exam found visual acuity of at least counting fingers. The patient tolerated the procedure well. There were no complications. The patient received written and verbal post procedure care education.      Intravitreal Injection, Pharmacologic Agent - OS - Left Eye       Time Out 12/26/2022. 9:44 AM. Confirmed correct patient, procedure, site, and patient consented.   Anesthesia Topical anesthesia was used. Anesthetic medications included Lidocaine 2%, Proparacaine 0.5%.   Procedure Preparation included 5% betadine to ocular surface, eyelid speculum. A (32g) needle was used.   Injection: 1.25 mg Bevacizumab 1.25mg /0.34ml   Route: Intravitreal, Site: Left Eye   NDC: P3213405, Lot: [id number],  Expiration date: 03/29/2023   Post-op Post injection exam found visual acuity of at least counting fingers. The patient tolerated the procedure well. There were no complications. The patient received written and verbal post procedure care education.           ASSESSMENT/PLAN:  1,2 Severe Non-proliferative diabetic retinopathy, both eyes  - s/p IVA OS #1 (04.04.24), #2 (05.02.24)  - s/p IVA OD #1 (05.02.24)  - last A1c was 7.4 on 3.15.24 - BCVA 20/25 OD, 20/200 OS -- decreased from 20/150 - exam shows scattered MA/DBH and exudates OU (OS > OD) - FA (04.04.24) shows leaking MA greatest posteriorly, no NV OU - OCT shows OD: persistent IRF / IRHM greatest centrally -- slightly increased; OS: Persistent IRF -- greatest inferior macula; mild interval improvement in [facility] -- greatest superior macula - recommend IVA OU (OD #2 and OS #3) today, 05.30.24 for DME - pt wishes to proceed - RBA of procedure discussed, questions answered - IVA informed consent obtained and signed, 04.04.24 (OU) - see procedure note - f/u in 4 wks -- DFE/OCT, possible injection  3,4. Hypertensive retinopathy OU - discussed importance of tight BP control - monitor  5. Mixed Cataract OS - The symptoms of cataract, surgical options, and treatments and risks were discussed with patient. - discussed diagnosis and progression - under the expert management  of [facility] - clear from a retina standpoint to proceed with cataract surgery when pt and surgeon are ready   6. Pseudophakia OD  - s/p CE/IOL (Dr. Dione Booze, 2017)  - IOL in good position, doing well  - monitor  Ophthalmic Meds Ordered this visit:  Meds ordered this encounter  Medications   Bevacizumab (AVASTIN) SOLN 1.25 mg   Bevacizumab (AVASTIN) SOLN 1.25 mg    Return in about 4 weeks (around 01/23/2023) for f/u NPDR OU, DFE, OCT.  There are no Patient Instructions on file for this visit.  Explained the diagnoses, plan, and follow up with the patient  and they expressed understanding.  Patient expressed understanding of the importance of proper follow up care.   This document serves as a record of services personally performed by Karie Chimera, MD, PhD. It was created on their behalf by Glee Arvin. Manson Passey, OA an ophthalmic technician. The creation of this record is the provider's dictation and/or activities during the visit.    Electronically signed by: Glee Arvin. Kristopher Oppenheim 05.16.2024 4:48 PM  Karie Chimera, M.D., Ph.D. Diseases & Surgery of the Retina and Vitreous [facility] 12/26/2022  I have reviewed the above documentation for accuracy and completeness, and I agree with the above. Karie Chimera, M.D., Ph.D. 12/26/22 4:49 PM  Abbreviations: M myopia (nearsighted); A astigmatism; H hyperopia (farsighted); P presbyopia; Mrx spectacle prescription;  CTL contact lenses; OD right eye; OS left eye; OU both eyes  XT exotropia; ET esotropia; PEK punctate epithelial keratitis; PEE punctate epithelial erosions; DES dry eye syndrome; MGD meibomian gland dysfunction; ATs artificial tears; PFAT's preservative free artificial tears; NSC nuclear sclerotic cataract; PSC posterior subcapsular cataract; ERM epi-retinal membrane; PVD posterior vitreous detachment; RD retinal detachment; DM diabetes mellitus; DR diabetic retinopathy; NPDR non-proliferative diabetic retinopathy; PDR proliferative diabetic retinopathy; CSME clinically significant macular edema; DME diabetic macular edema; dbh dot blot hemorrhages; CWS cotton wool spot; POAG primary open angle glaucoma; C/D cup-to-disc ratio; HVF humphrey visual field; GVF goldmann visual field; OCT optical coherence tomography; IOP intraocular pressure; BRVO Branch retinal vein occlusion; CRVO central retinal vein occlusion; CRAO central retinal artery occlusion; BRAO branch retinal artery occlusion; RT retinal tear; SB scleral buckle; PPV pars plana vitrectomy; VH Vitreous hemorrhage; PRP  panretinal laser photocoagulation; IVK intravitreal kenalog; VMT vitreomacular traction; MH Macular hole;  NVD neovascularization of the disc; NVE neovascularization elsewhere; AREDS age related eye disease study; ARMD age related macular degeneration; POAG primary open angle glaucoma; EBMD epithelial/anterior basement membrane dystrophy; ACIOL anterior chamber intraocular lens; IOL intraocular lens; PCIOL posterior chamber intraocular lens; Phaco/IOL phacoemulsification with intraocular lens placement; PRK photorefractive keratectomy; LASIK laser assisted in situ keratomileusis; HTN hypertension; DM diabetes mellitus; COPD chronic obstructive pulmonary disease

## 2022-12-13 ENCOUNTER — Telehealth: Payer: Self-pay

## 2022-12-13 DIAGNOSIS — N2581 Secondary hyperparathyroidism of renal origin: Secondary | ICD-10-CM | POA: Diagnosis not present

## 2022-12-13 DIAGNOSIS — Z992 Dependence on renal dialysis: Secondary | ICD-10-CM | POA: Diagnosis not present

## 2022-12-13 DIAGNOSIS — N186 End stage renal disease: Secondary | ICD-10-CM | POA: Diagnosis not present

## 2022-12-13 DIAGNOSIS — E1142 Type 2 diabetes mellitus with diabetic polyneuropathy: Secondary | ICD-10-CM

## 2022-12-13 DIAGNOSIS — E1122 Type 2 diabetes mellitus with diabetic chronic kidney disease: Secondary | ICD-10-CM | POA: Diagnosis not present

## 2022-12-13 NOTE — Telephone Encounter (Signed)
Pt states she can not afford $52.00 for the following medication:   insulin glargine-yfgn (SEMGLEE) 100 UNIT/ML Pen   [id number] - [place], [place] - [address] (Ph: [phone])

## 2022-12-14 ENCOUNTER — Other Ambulatory Visit (HOSPITAL_COMMUNITY): Payer: Self-pay

## 2022-12-15 MED ORDER — BASAGLAR KWIKPEN 100 UNIT/ML ~~LOC~~ SOPN
10.0000 [IU] | PEN_INJECTOR | Freq: Every day | SUBCUTANEOUS | 2 refills | Status: DC
Start: 2022-12-15 — End: 2023-03-12

## 2022-12-16 ENCOUNTER — Telehealth: Payer: Self-pay | Admitting: *Deleted

## 2022-12-16 DIAGNOSIS — N2581 Secondary hyperparathyroidism of renal origin: Secondary | ICD-10-CM | POA: Diagnosis not present

## 2022-12-16 DIAGNOSIS — Z992 Dependence on renal dialysis: Secondary | ICD-10-CM | POA: Diagnosis not present

## 2022-12-16 DIAGNOSIS — N186 End stage renal disease: Secondary | ICD-10-CM | POA: Diagnosis not present

## 2022-12-16 NOTE — Telephone Encounter (Signed)
Per scheduling message Erie Noe - Called patient and lvm of upcoming appointments, requested call back to confirm.

## 2022-12-18 ENCOUNTER — Other Ambulatory Visit (HOSPITAL_COMMUNITY): Payer: Self-pay

## 2022-12-18 DIAGNOSIS — N186 End stage renal disease: Secondary | ICD-10-CM | POA: Diagnosis not present

## 2022-12-18 DIAGNOSIS — Z992 Dependence on renal dialysis: Secondary | ICD-10-CM | POA: Diagnosis not present

## 2022-12-18 DIAGNOSIS — N2581 Secondary hyperparathyroidism of renal origin: Secondary | ICD-10-CM | POA: Diagnosis not present

## 2022-12-20 DIAGNOSIS — Z992 Dependence on renal dialysis: Secondary | ICD-10-CM | POA: Diagnosis not present

## 2022-12-20 DIAGNOSIS — N186 End stage renal disease: Secondary | ICD-10-CM | POA: Diagnosis not present

## 2022-12-20 DIAGNOSIS — N2581 Secondary hyperparathyroidism of renal origin: Secondary | ICD-10-CM | POA: Diagnosis not present

## 2022-12-21 DIAGNOSIS — N186 End stage renal disease: Secondary | ICD-10-CM | POA: Diagnosis not present

## 2022-12-21 DIAGNOSIS — N2581 Secondary hyperparathyroidism of renal origin: Secondary | ICD-10-CM | POA: Diagnosis not present

## 2022-12-21 DIAGNOSIS — Z992 Dependence on renal dialysis: Secondary | ICD-10-CM | POA: Diagnosis not present

## 2022-12-21 DIAGNOSIS — E8770 Fluid overload, unspecified: Secondary | ICD-10-CM | POA: Diagnosis not present

## 2022-12-23 DIAGNOSIS — N186 End stage renal disease: Secondary | ICD-10-CM | POA: Diagnosis not present

## 2022-12-23 DIAGNOSIS — N2581 Secondary hyperparathyroidism of renal origin: Secondary | ICD-10-CM | POA: Diagnosis not present

## 2022-12-23 DIAGNOSIS — Z992 Dependence on renal dialysis: Secondary | ICD-10-CM | POA: Diagnosis not present

## 2022-12-25 DIAGNOSIS — Z992 Dependence on renal dialysis: Secondary | ICD-10-CM | POA: Diagnosis not present

## 2022-12-25 DIAGNOSIS — N2581 Secondary hyperparathyroidism of renal origin: Secondary | ICD-10-CM | POA: Diagnosis not present

## 2022-12-25 DIAGNOSIS — N186 End stage renal disease: Secondary | ICD-10-CM | POA: Diagnosis not present

## 2022-12-26 ENCOUNTER — Ambulatory Visit (INDEPENDENT_AMBULATORY_CARE_PROVIDER_SITE_OTHER): Payer: Medicare HMO | Admitting: Ophthalmology

## 2022-12-26 ENCOUNTER — Encounter (INDEPENDENT_AMBULATORY_CARE_PROVIDER_SITE_OTHER): Payer: Self-pay | Admitting: Ophthalmology

## 2022-12-26 DIAGNOSIS — I10 Essential (primary) hypertension: Secondary | ICD-10-CM

## 2022-12-26 DIAGNOSIS — H35033 Hypertensive retinopathy, bilateral: Secondary | ICD-10-CM | POA: Diagnosis not present

## 2022-12-26 DIAGNOSIS — Z961 Presence of intraocular lens: Secondary | ICD-10-CM | POA: Diagnosis not present

## 2022-12-26 DIAGNOSIS — E113413 Type 2 diabetes mellitus with severe nonproliferative diabetic retinopathy with macular edema, bilateral: Secondary | ICD-10-CM

## 2022-12-26 DIAGNOSIS — Z794 Long term (current) use of insulin: Secondary | ICD-10-CM

## 2022-12-26 DIAGNOSIS — H25812 Combined forms of age-related cataract, left eye: Secondary | ICD-10-CM | POA: Diagnosis not present

## 2022-12-26 MED ORDER — BEVACIZUMAB CHEMO INJECTION 1.25MG/0.05ML SYRINGE FOR KALEIDOSCOPE
1.2500 mg | INTRAVITREAL | Status: AC | PRN
Start: 2022-12-26 — End: 2022-12-26
  Administered 2022-12-26: 1.25 mg via INTRAVITREAL

## 2022-12-27 DIAGNOSIS — I129 Hypertensive chronic kidney disease with stage 1 through stage 4 chronic kidney disease, or unspecified chronic kidney disease: Secondary | ICD-10-CM | POA: Diagnosis not present

## 2022-12-27 DIAGNOSIS — Z992 Dependence on renal dialysis: Secondary | ICD-10-CM | POA: Diagnosis not present

## 2022-12-27 DIAGNOSIS — N2581 Secondary hyperparathyroidism of renal origin: Secondary | ICD-10-CM | POA: Diagnosis not present

## 2022-12-27 DIAGNOSIS — N186 End stage renal disease: Secondary | ICD-10-CM | POA: Diagnosis not present

## 2022-12-30 DIAGNOSIS — E1122 Type 2 diabetes mellitus with diabetic chronic kidney disease: Secondary | ICD-10-CM | POA: Diagnosis not present

## 2022-12-30 DIAGNOSIS — N2581 Secondary hyperparathyroidism of renal origin: Secondary | ICD-10-CM | POA: Diagnosis not present

## 2022-12-30 DIAGNOSIS — N186 End stage renal disease: Secondary | ICD-10-CM | POA: Diagnosis not present

## 2022-12-30 DIAGNOSIS — Z992 Dependence on renal dialysis: Secondary | ICD-10-CM | POA: Diagnosis not present

## 2023-01-01 DIAGNOSIS — Z992 Dependence on renal dialysis: Secondary | ICD-10-CM | POA: Diagnosis not present

## 2023-01-01 DIAGNOSIS — N2581 Secondary hyperparathyroidism of renal origin: Secondary | ICD-10-CM | POA: Diagnosis not present

## 2023-01-01 DIAGNOSIS — E1122 Type 2 diabetes mellitus with diabetic chronic kidney disease: Secondary | ICD-10-CM | POA: Diagnosis not present

## 2023-01-01 DIAGNOSIS — N186 End stage renal disease: Secondary | ICD-10-CM | POA: Diagnosis not present

## 2023-01-02 ENCOUNTER — Encounter (HOSPITAL_COMMUNITY)
Admission: RE | Admit: 2023-01-02 | Discharge: 2023-01-02 | Disposition: A | Discharge status: Home / Self Care | Payer: Medicare Other | Source: Ambulatory Visit | Attending: Hematology & Oncology | Admitting: Hematology & Oncology

## 2023-01-02 DIAGNOSIS — C3411 Malignant neoplasm of upper lobe, right bronchus or lung: Secondary | ICD-10-CM

## 2023-01-02 DIAGNOSIS — C3490 Malignant neoplasm of unspecified part of unspecified bronchus or lung: Secondary | ICD-10-CM | POA: Diagnosis not present

## 2023-01-02 LAB — GLUCOSE, CAPILLARY: Glucose-Capillary: 326 mg/dL — ABNORMAL HIGH (ref 70–99)

## 2023-01-02 MED ORDER — FLUDEOXYGLUCOSE F - 18 (FDG) INJECTION
5.6900 | Freq: Once | INTRAVENOUS | Status: AC
  Administered 2023-01-02: 5.69 via INTRAVENOUS

## 2023-01-03 DIAGNOSIS — N186 End stage renal disease: Secondary | ICD-10-CM | POA: Diagnosis not present

## 2023-01-03 DIAGNOSIS — N2581 Secondary hyperparathyroidism of renal origin: Secondary | ICD-10-CM | POA: Diagnosis not present

## 2023-01-03 DIAGNOSIS — Z992 Dependence on renal dialysis: Secondary | ICD-10-CM | POA: Diagnosis not present

## 2023-01-06 DIAGNOSIS — N186 End stage renal disease: Secondary | ICD-10-CM | POA: Diagnosis not present

## 2023-01-06 DIAGNOSIS — N2581 Secondary hyperparathyroidism of renal origin: Secondary | ICD-10-CM | POA: Diagnosis not present

## 2023-01-06 DIAGNOSIS — Z992 Dependence on renal dialysis: Secondary | ICD-10-CM | POA: Diagnosis not present

## 2023-01-08 ENCOUNTER — Inpatient Hospital Stay (HOSPITAL_BASED_OUTPATIENT_CLINIC_OR_DEPARTMENT_OTHER): Payer: Medicare Other | Admitting: Hematology & Oncology

## 2023-01-08 ENCOUNTER — Encounter: Payer: Self-pay | Admitting: Hematology & Oncology

## 2023-01-08 ENCOUNTER — Inpatient Hospital Stay: Payer: Medicare Other | Attending: Radiation Oncology

## 2023-01-08 ENCOUNTER — Other Ambulatory Visit: Payer: Self-pay

## 2023-01-08 ENCOUNTER — Inpatient Hospital Stay: Payer: Medicare Other

## 2023-01-08 VITALS — BP 136/49 | HR 74 | Temp 98.3°F | Resp 17 | Ht 67.0 in | Wt 109.4 lb

## 2023-01-08 DIAGNOSIS — E119 Type 2 diabetes mellitus without complications: Secondary | ICD-10-CM | POA: Diagnosis not present

## 2023-01-08 DIAGNOSIS — C3411 Malignant neoplasm of upper lobe, right bronchus or lung: Secondary | ICD-10-CM

## 2023-01-08 DIAGNOSIS — F17210 Nicotine dependence, cigarettes, uncomplicated: Secondary | ICD-10-CM | POA: Diagnosis not present

## 2023-01-08 DIAGNOSIS — N186 End stage renal disease: Secondary | ICD-10-CM | POA: Diagnosis not present

## 2023-01-08 DIAGNOSIS — Z85118 Personal history of other malignant neoplasm of bronchus and lung: Secondary | ICD-10-CM | POA: Insufficient documentation

## 2023-01-08 DIAGNOSIS — Z992 Dependence on renal dialysis: Secondary | ICD-10-CM

## 2023-01-08 DIAGNOSIS — C3491 Malignant neoplasm of unspecified part of right bronchus or lung: Secondary | ICD-10-CM | POA: Diagnosis not present

## 2023-01-08 DIAGNOSIS — Z7984 Long term (current) use of oral hypoglycemic drugs: Secondary | ICD-10-CM | POA: Insufficient documentation

## 2023-01-08 DIAGNOSIS — N2581 Secondary hyperparathyroidism of renal origin: Secondary | ICD-10-CM | POA: Diagnosis not present

## 2023-01-08 LAB — CBC WITH DIFFERENTIAL (CANCER CENTER ONLY)
Abs Immature Granulocytes: 0.15 10*3/uL — ABNORMAL HIGH (ref 0.00–0.07)
Basophils Absolute: 0 10*3/uL (ref 0.0–0.1)
Basophils Relative: 0 %
Eosinophils Absolute: 0.1 10*3/uL (ref 0.0–0.5)
Eosinophils Relative: 1 %
HCT: 33.4 % — ABNORMAL LOW (ref 36.0–46.0)
Hemoglobin: 10.4 g/dL — ABNORMAL LOW (ref 12.0–15.0)
Immature Granulocytes: 2 %
Lymphocytes Relative: 11 %
Lymphs Abs: 1.1 10*3/uL (ref 0.7–4.0)
MCH: 28.6 pg (ref 26.0–34.0)
MCHC: 31.1 g/dL (ref 30.0–36.0)
MCV: 91.8 fL (ref 80.0–100.0)
Monocytes Absolute: 0.7 10*3/uL (ref 0.1–1.0)
Monocytes Relative: 6 %
Neutro Abs: 8.3 10*3/uL — ABNORMAL HIGH (ref 1.7–7.7)
Neutrophils Relative %: 80 %
Platelet Count: 181 10*3/uL (ref 150–400)
RBC: 3.64 MIL/uL — ABNORMAL LOW (ref 3.87–5.11)
RDW: 17.1 % — ABNORMAL HIGH (ref 11.5–15.5)
WBC Count: 10.3 10*3/uL (ref 4.0–10.5)
nRBC: 0 % (ref 0.0–0.2)

## 2023-01-08 LAB — FERRITIN: Ferritin: 447 ng/mL — ABNORMAL HIGH (ref 11–307)

## 2023-01-08 LAB — RETICULOCYTES
Immature Retic Fract: 5.1 % (ref 2.3–15.9)
RBC.: 3.61 MIL/uL — ABNORMAL LOW (ref 3.87–5.11)
Retic Count, Absolute: 68.2 10*3/uL (ref 19.0–186.0)
Retic Ct Pct: 1.9 % (ref 0.4–3.1)

## 2023-01-08 LAB — IRON AND IRON BINDING CAPACITY (CC-WL,HP ONLY)
Iron: 47 ug/dL (ref 28–170)
Saturation Ratios: 18 % (ref 10.4–31.8)
TIBC: 265 ug/dL (ref 250–450)
UIBC: 218 ug/dL (ref 148–442)

## 2023-01-08 LAB — CMP (CANCER CENTER ONLY)
ALT: 13 U/L (ref 0–44)
AST: 15 U/L (ref 15–41)
Albumin: 3.8 g/dL (ref 3.5–5.0)
Alkaline Phosphatase: 94 U/L (ref 38–126)
Anion gap: 11 (ref 5–15)
BUN: 12 mg/dL (ref 6–20)
CO2: 32 mmol/L (ref 22–32)
Calcium: 9.3 mg/dL (ref 8.9–10.3)
Chloride: 94 mmol/L — ABNORMAL LOW (ref 98–111)
Creatinine: 2.43 mg/dL — ABNORMAL HIGH (ref 0.44–1.00)
GFR, Estimated: 22 mL/min — ABNORMAL LOW (ref >60)
Glucose, Bld: 430 mg/dL — ABNORMAL HIGH (ref 70–99)
Potassium: 3.4 mmol/L — ABNORMAL LOW (ref 3.5–5.1)
Sodium: 137 mmol/L (ref 135–145)
Total Bilirubin: 0.3 mg/dL (ref 0.3–1.2)
Total Protein: 6.4 g/dL — ABNORMAL LOW (ref 6.5–8.1)

## 2023-01-08 LAB — LACTATE DEHYDROGENASE: LDH: 241 U/L — ABNORMAL HIGH (ref 98–192)

## 2023-01-08 MED ORDER — INSULIN ASPART 100 UNIT/ML IJ SOLN
15.0000 [IU] | Freq: Once | INTRAMUSCULAR | Status: AC
  Administered 2023-01-08: 15 [IU] via SUBCUTANEOUS
  Filled 2023-01-08: qty 0.15

## 2023-01-08 MED ORDER — MEGESTROL ACETATE 400 MG/10ML PO SUSP: 400.0000 mg | Freq: Two times a day (BID) | ORAL | 3 refills | Status: DC

## 2023-01-08 NOTE — Patient Instructions (Signed)
Insulin Aspart Injection What is this medication? INSULIN ASPART (IN su lin AS part) treats diabetes. It works by increasing insulin levels in your body, which decreases your blood sugar (glucose). It belongs to a group of medications called rapid-acting insulins. Changes to diet and exercise are often combined with this medication. This medicine may be used for other purposes; ask your health care provider or pharmacist if you have questions. COMMON BRAND NAME(S): [place], [employer], [employer], [employer], NovoLog, NovoLog [place], NovoLog PenFill What should I tell my care team before I take this medication? They need to know if you have any of these conditions: Episodes of low blood sugar Eye disease, vision problems Kidney disease Liver disease An unusual or allergic reaction to insulin, metacresol, other medications, foods, dyes, or preservatives Pregnant or trying to get pregnant Breast-feeding How should I use this medication? This medication is injected under the skin. Use it exactly as directed. It is important to follow the directions given to you by your care team. If you are using Novolog, you should start your meal within 5 to 10 minutes after injection. If you are using Fiasp, you should start your meal at the time of injection or within 20 minutes after injection. Have food ready before injection. Do not delay eating. You will be taught how to use this medication and how to adjust doses for activities and illness. Do not use more insulin than prescribed. Do not use it more or less often than prescribed. Always check the appearance of your insulin before using it. This insulin should be clear and colorless like water. Do not use if it is cloudy, thickened, colored, or has solid particles in it. If you use a pen, be sure to take off the outer needle cover before using the dose. It is important that you put your used needles and syringes in a special sharps container. Do  not put them in a trash can. If you do not have a sharps container, call your pharmacist or care team to get one. This medication comes with INSTRUCTIONS FOR USE. Ask your pharmacist for directions on how to use this medication. Read the information carefully. Talk to your pharmacist or care team if you have questions. Talk to your care team about the use of this medication in children. While it may be prescribed for children as young as 2 years for selected conditions, precautions do apply. Overdosage: If you think you have taken too much of this medicine contact a poison control center or emergency room at once. NOTE: This medicine is only for you. Do not share this medicine with others. What if I miss a dose? It is important not to miss a dose. Your care team should discuss a plan for missed doses with you. If you do miss a dose, follow their plan. Do not take double doses. What may interact with this medication? Some medications may affect your blood sugar levels or hide the symptoms of low blood sugar (hypoglycemia). Talk with your care team about all of the medications you take. They may suggest changes to your insulin dose or checking your blood sugar levels more often. Medications that may affect your blood sugar levels include: Alcohol Certain antibiotics, such as ciprofloxacin, levofloxacin, sulfamethoxazole; trimethoprim Certain medications for blood pressure or heart disease, such as benazepril, enalapril, lisinopril, losartan, valsartan Certain medications for mental health conditions, such as fluoxetine or olanzapine Diuretics, such as hydrochlorothiazide (HCTZ) Estrogen and progestin hormones Other medications for diabetes  Steroid medications, such as prednisone or cortisone Testosterone Thyroid hormones Medications that may mask symptoms of low blood sugar include: Beta blockers, such as atenolol, metoprolol, propranolol Clonidine Guanethidine Reserpine This list may not  describe all possible interactions. Give your health care provider a list of all the medicines, herbs, non-prescription drugs, or dietary supplements you use. Also tell them if you smoke, drink alcohol, or use illegal drugs. Some items may interact with your medicine. What should I watch for while using this medication? Visit your care team for regular checks on your progress. Your care team will monitor your hemoglobin A1C. This is a simple blood test. It measures your average blood sugar level over the past 3 months. It will help you and your care team manage your diabetes. Learn how to check your blood sugar levels. Know the symptoms of low and high blood sugar and how to manage them. Always carry a source of quick sugar with you for symptoms of low blood sugar. Examples include glucose tablets, juice, or sugar candy. Teach your family members, friends, and others how to help you if your blood sugar is too low and you are not awake enough to treat it. Talk to your care team if you have high blood sugar. You may need to adjust your insulin dose. Many factors can cause high blood sugar, including illness, stress, or a change in activity. Do not skip meals. Ask your care team if you should avoid alcohol. Many cough and cold products contain sugar or alcohol. These can affect blood sugar levels. Make sure that you have the correct syringe for the type of insulin you use. Do not change the brand or type of insulin or syringe unless your care team tells you to. Switching insulin brand or type can affect your blood sugar enough to cause serious adverse effects. Always keep an extra supply of insulin and related supplies on hand. Only use syringes once. Get rid of syringes and needles in a closed container to prevent accidental needle sticks. Do not share insulin pens or cartridges with anyone, even if the needle is changed. Each pen should only be used by one person. Sharing could cause an infection. Do not  use a syringe to take insulin out of an insulin pen. Doing this may result in the wrong dose of insulin. Wear a medical ID bracelet or chain. Carry a card that describes your condition. List the medications and doses you take on the card. What side effects may I notice from receiving this medication? Side effects that you should report to your care team as soon as possible: Allergic reactions--skin rash, itching, hives, swelling of the face, lips, tongue, or throat Low blood sugar (hypoglycemia)--tremors or shaking, anxiety, sweating, cold or clammy skin, confusion, dizziness, rapid heartbeat Low potassium level--muscle pain or cramps, unusual weakness or fatigue, fast or irregular heartbeat, constipation Side effects that usually do not require medical attention (report to your care team if they continue or are bothersome): Lipodystrophy--hardening or scarring of tissue at injection site Pain, redness, or irritation at injection site Weight gain This list may not describe all possible side effects. Call your doctor for medical advice about side effects. You may report side effects to FDA at 1-800-FDA-1088. Where should I keep my medication? Keep out of the reach of children and pets. Storage and expiration dates for different insulin products may vary. Check the label for information on how to store your insulin. Talk to your care team if you have  any questions. Do not freeze. Protect from direct light and heat. Do not use insulin if it is exposed to temperatures above 37 degrees C (98.6 degrees F). Do not use insulin if it has been frozen. Fiasp multi-dose vials, Novolog multiple-dose vials, or Fiasp FlexTouch pen Unopened (not in-use): Store at room temperature up to 30 degrees C (86 degrees F) for up to 28 days, or refrigerated until the expiration date. Opened (in-use): Store at room temperature or refrigerated for up to 28 days. Fiasp PenFill cartridges, Novolog PenFill cartridges, Novolog  FlexPen, or Novolog FlexTouch Unopened (not in-use): Store at room temperature up to 30 degrees C (86 degrees F) for up to 28 days, or refrigerated until the expiration date. Opened (in-use): Store at room temperature for up to 28 days. Do not refrigerate. Fiasp PumpCart cartridges Unopened (not in-use): Store at room temperature up to 30 degrees C (86 degrees F) for up to 18 days (including 4 days in the pump), or refrigerated until the expiration date. Opened (in-use): Store at room temperature for up to 4 days. Do not refrigerate. Insulin Pump Users Change the insulin in your pump reservoir as directed by the pump user manual or the insulin label, whichever is first. To get rid of medications that are no longer needed or have expired: Take the medication to a medication take-back program. Check with your pharmacy or law enforcement to find a location. If you cannot return the medication, ask your pharmacist or care team how to get rid of this medication safely. NOTE: This sheet is a summary. It may not cover all possible information. If you have questions about this medicine, talk to your doctor, pharmacist, or health care provider.  2024 Elsevier/Gold Standard (2022-05-13 00:00:00)

## 2023-01-08 NOTE — Progress Notes (Signed)
Hematology and Oncology Follow Up Visit  Amber Cross [id number] 05-25-1961 62 y.o. 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  We first saw her back on 10/31/2022.  At that time, she presented with a history of a limited stage small cell lung cancer.  Because of other health issues, she cannot have chemotherapy and radiation therapy.  She subsequently underwent radiosurgery.  She had 3 fractions that made up 54Gy over radiation.  She had a PET scan that was done last week.  Unfortunately, the results are still not back.  I suspect that part of the problem IV her blood sugars are horribly controlled.  She does not take her insulin at home.  She had a blood sugar today of 430.  We will give her 15 units of insulin in the office just to try to help bring the sugar level down.  She is on dialysis.  She really has a tough time getting dialysis.  She is making some urine.  She I think is still smoking a little bit.  She proximately about half pack per day.  She is smoking some marijuana.  Unfortunately, her appetite is just not that great.  Will try her on some Megace elixir.  She would not complain of any cough or shortness of breath.  Overall, I would say her performance status is probably ECOG 2.  Medications:  Current Outpatient Medications:    acetaminophen (TYLENOL) 500 MG tablet, Take 1,000 mg by mouth every 6 (six) hours as needed for mild pain., Disp: , Rfl:    albuterol (VENTOLIN HFA) 108 (90 Base) MCG/ACT inhaler, Inhale 2 puffs into the lungs every 6 (six) hours as needed for wheezing or shortness of breath., Disp: 6.7 g, Rfl: 2   amLODipine (NORVASC) 10 MG tablet, Take 1 tablet (10 mg total) by mouth daily., Disp: 90 tablet, Rfl: 3   aspirin EC 81 MG tablet, Take 1 tablet (81 mg total) by mouth daily.  Swallow whole., Disp: 30 tablet, Rfl: 11   atorvastatin (LIPITOR) 80 MG tablet, Take 1 tablet (80 mg total) by mouth daily., Disp: 90 tablet, Rfl: 3   B Complex-C-Zn-Folic Acid (DIALYVITE 800 WITH ZINC) 0.8 MG TABS, Take 1 tablet by mouth daily., Disp: , Rfl:    Blood Glucose Calibration (MYGLUCOHEALTH CONTROL) SOLN, 1 Application by miscellaneous route once a week., Disp: , Rfl:    Calcium Acetate 668 (169 Ca) MG TABS, Take 1 tablet by mouth 3 (three) times daily., Disp: , Rfl:    carvedilol (COREG) 12.5 MG tablet, Take 12.5 mg by mouth., Disp: , Rfl:    Continuous Blood Gluc Sensor (DEXCOM G7 SENSOR) MISC, Apply each device on your skin every 10 days, Disp: 3 each, Rfl: 2   Continuous Glucose Receiver (DEXCOM G7 RECEIVER) DEVI, , Disp: , Rfl:    cyclobenzaprine (FLEXERIL) 10 MG tablet, Take by mouth., Disp: , Rfl:    furosemide (LASIX) 40 MG tablet, Take 1 tablet (40 mg total) by mouth daily as needed (swelling in legs or weight gain)., Disp: 30 tablet, Rfl: 1   gabapentin (NEURONTIN) 600 MG tablet, Take 600 mg by mouth daily., Disp: , Rfl:    glucose blood (PRECISION QID TEST) test strip, , Disp: , Rfl:    hydrALAZINE (APRESOLINE) 50 MG tablet, Take 1 tablet (50 mg total)  by mouth every 8 (eight) hours., Disp: 180 tablet, Rfl: 3   Insulin Glargine (BASAGLAR KWIKPEN) 100 UNIT/ML, Inject 10 Units into the skin daily., Disp: 3 mL, Rfl: 2   Insulin Pen Needle (UNIFINE PENTIPS) 32G X 4 MM MISC, Use in the morning and at bedtime, Disp: 100 each, Rfl: 3   Lancet Devices (ADJUSTABLE LANCING DEVICE) MISC, , Disp: , Rfl:    losartan (COZAAR) 25 MG tablet, Take 1 tablet (25 mg total) by mouth daily., Disp: 30 tablet, Rfl: 11   megestrol (MEGACE) 40 MG tablet, Take 40 mg by mouth daily., Disp: , Rfl:    mirtazapine (REMERON) 15 MG tablet, Take 1 tablet (15 mg total) by mouth at bedtime., Disp: 30 tablet, Rfl: 3   sevelamer carbonate (RENVELA) 800 MG tablet, Take 800 mg by mouth 3 (three) times daily with  meals., Disp: , Rfl:    SITagliptin 25 MG TABS, Take 25 mg by mouth daily before breakfast., Disp: 30 tablet, Rfl: 2   traZODone (DESYREL) 50 MG tablet, Take 1 tablet (50 mg total) by mouth at bedtime., Disp: 90 tablet, Rfl: 1   Vitamin D, Ergocalciferol, (DRISDOL) 1.25 MG (50000 UNIT) CAPS capsule, Take 1 capsule (50,000 Units total) by mouth every 7 (seven) days., Disp: 5 capsule, Rfl: 0 No current facility-administered medications for this visit.  Facility-Administered Medications Ordered in Other Visits:    insulin aspart (novoLOG) injection 15 Units, 15 Units, Subcutaneous, Once, Rylea Selway, Rose Phi, MD  Allergies:  Allergies  Allergen Reactions   Penicillins Itching and Other (See Comments)    redness   Strawberry (Diagnostic) Itching and Swelling   Tomato Itching and Swelling    Past Medical History, Surgical history, Social history, and Family History were reviewed and updated.  Review of Systems: Review of Systems  Constitutional:  Positive for appetite change and unexpected weight change.  HENT:  Negative.    Eyes: Negative.   Respiratory: Negative.    Cardiovascular: Negative.   Gastrointestinal: Negative.   Endocrine: Negative.   Genitourinary: Negative.    Musculoskeletal: Negative.   Skin: Negative.   Neurological: Negative.   Hematological: Negative.   Psychiatric/Behavioral: Negative.      Physical Exam:  height is 5\' 7"  (1.702 m) and weight is 109 lb 6.4 oz (49.6 kg). Her oral temperature is 98.3 F (36.8 C). Her blood pressure is 136/49 (abnormal) and her pulse is 74. Her respiration is 17 and oxygen saturation is 95%.   Wt Readings from Last 3 Encounters:  01/08/23 109 lb 6.4 oz (49.6 kg)  12/05/22 114 lb 4.8 oz (51.8 kg)  10/31/22 113 lb (51.3 kg)    Physical Exam Vitals reviewed.  HENT:     Head: Normocephalic and atraumatic.  Eyes:     Pupils: Pupils are equal, round, and reactive to light.  Cardiovascular:     Rate and Rhythm: Normal rate and  regular rhythm.     Heart sounds: Normal heart sounds.  Pulmonary:     Effort: Pulmonary effort is normal.     Breath sounds: Normal breath sounds.  Abdominal:     General: Bowel sounds are normal.     Palpations: Abdomen is soft.  Musculoskeletal:        General: No tenderness or deformity. Normal range of motion.     Cervical back: Normal range of motion.  Lymphadenopathy:     Cervical: No cervical adenopathy.  Skin:    General: Skin is warm and dry.  Findings: No erythema or rash.  Neurological:     Mental Status: She is alert and oriented to person, place, and time.  Psychiatric:        Behavior: Behavior normal.        Thought Content: Thought content normal.        Judgment: Judgment normal.      Lab Results  Component Value Date   WBC 10.3 01/08/2023   HGB 10.4 (L) 01/08/2023   HCT 33.4 (L) 01/08/2023   MCV 91.8 01/08/2023   PLT 181 01/08/2023     Chemistry      Component Value Date/Time   NA 137 01/08/2023 1153   NA 138 02/20/2022 1207   K 3.4 (L) 01/08/2023 1153   CL 94 (L) 01/08/2023 1153   CO2 32 01/08/2023 1153   BUN 12 01/08/2023 1153   BUN 25 02/20/2022 1207   CREATININE 2.43 (H) 01/08/2023 1153      Component Value Date/Time   CALCIUM 9.3 01/08/2023 1153   ALKPHOS 94 01/08/2023 1153   AST 15 01/08/2023 1153   ALT 13 01/08/2023 1153   BILITOT 0.3 01/08/2023 1153      Impression and Plan: Ms. Roccia is a very nice 62 year old African-American female.  [birth year].  She is going to [facility].  Hopefully, the PET scan will not show that she has any active disease.  I think that if she does, it certainly might be difficult to treat her.  I would like to see if the Megace will help with her appetite.  I really hope that her blood sugars are better controlled.  We will get her back in 6 weeks for follow-up.  If the PET scan looks okay, then we probably will not need another PET scan until August or September.  I hate that she  still needs to have dialysis.  I know this does not make her feel good   Josph Macho, MD 6/12/20241:20 PM

## 2023-01-09 ENCOUNTER — Telehealth: Payer: Self-pay | Admitting: *Deleted

## 2023-01-09 LAB — ERYTHROPOIETIN: Erythropoietin: 18 m[IU]/mL (ref 2.6–18.5)

## 2023-01-09 NOTE — Telephone Encounter (Signed)
Per 01/08/23 LOS - called and lvm of upcoming appointments, requested callback to confirm.

## 2023-01-10 DIAGNOSIS — Z992 Dependence on renal dialysis: Secondary | ICD-10-CM | POA: Diagnosis not present

## 2023-01-10 DIAGNOSIS — N186 End stage renal disease: Secondary | ICD-10-CM | POA: Diagnosis not present

## 2023-01-10 DIAGNOSIS — N2581 Secondary hyperparathyroidism of renal origin: Secondary | ICD-10-CM | POA: Diagnosis not present

## 2023-01-14 ENCOUNTER — Ambulatory Visit (HOSPITAL_COMMUNITY): Admission: RE | Admit: 2023-01-14 | Payer: 59 | Source: Ambulatory Visit

## 2023-01-14 DIAGNOSIS — E1122 Type 2 diabetes mellitus with diabetic chronic kidney disease: Secondary | ICD-10-CM | POA: Diagnosis not present

## 2023-01-14 DIAGNOSIS — N186 End stage renal disease: Secondary | ICD-10-CM | POA: Diagnosis not present

## 2023-01-14 DIAGNOSIS — N2581 Secondary hyperparathyroidism of renal origin: Secondary | ICD-10-CM | POA: Diagnosis not present

## 2023-01-14 DIAGNOSIS — Z992 Dependence on renal dialysis: Secondary | ICD-10-CM | POA: Diagnosis not present

## 2023-01-15 DIAGNOSIS — E1122 Type 2 diabetes mellitus with diabetic chronic kidney disease: Secondary | ICD-10-CM | POA: Diagnosis not present

## 2023-01-15 DIAGNOSIS — N2581 Secondary hyperparathyroidism of renal origin: Secondary | ICD-10-CM | POA: Diagnosis not present

## 2023-01-15 DIAGNOSIS — Z992 Dependence on renal dialysis: Secondary | ICD-10-CM | POA: Diagnosis not present

## 2023-01-15 DIAGNOSIS — N186 End stage renal disease: Secondary | ICD-10-CM | POA: Diagnosis not present

## 2023-01-17 DIAGNOSIS — Z992 Dependence on renal dialysis: Secondary | ICD-10-CM | POA: Diagnosis not present

## 2023-01-17 DIAGNOSIS — N186 End stage renal disease: Secondary | ICD-10-CM | POA: Diagnosis not present

## 2023-01-17 DIAGNOSIS — E1122 Type 2 diabetes mellitus with diabetic chronic kidney disease: Secondary | ICD-10-CM | POA: Diagnosis not present

## 2023-01-17 DIAGNOSIS — N2581 Secondary hyperparathyroidism of renal origin: Secondary | ICD-10-CM | POA: Diagnosis not present

## 2023-01-20 DIAGNOSIS — N186 End stage renal disease: Secondary | ICD-10-CM | POA: Diagnosis not present

## 2023-01-20 DIAGNOSIS — Z992 Dependence on renal dialysis: Secondary | ICD-10-CM | POA: Diagnosis not present

## 2023-01-20 DIAGNOSIS — N2581 Secondary hyperparathyroidism of renal origin: Secondary | ICD-10-CM | POA: Diagnosis not present

## 2023-01-23 ENCOUNTER — Encounter (INDEPENDENT_AMBULATORY_CARE_PROVIDER_SITE_OTHER): Payer: Medicare Other | Admitting: Ophthalmology

## 2023-01-23 DIAGNOSIS — H25812 Combined forms of age-related cataract, left eye: Secondary | ICD-10-CM

## 2023-01-23 DIAGNOSIS — N2581 Secondary hyperparathyroidism of renal origin: Secondary | ICD-10-CM | POA: Diagnosis not present

## 2023-01-23 DIAGNOSIS — Z992 Dependence on renal dialysis: Secondary | ICD-10-CM | POA: Diagnosis not present

## 2023-01-23 DIAGNOSIS — I10 Essential (primary) hypertension: Secondary | ICD-10-CM

## 2023-01-23 DIAGNOSIS — Z961 Presence of intraocular lens: Secondary | ICD-10-CM

## 2023-01-23 DIAGNOSIS — E113413 Type 2 diabetes mellitus with severe nonproliferative diabetic retinopathy with macular edema, bilateral: Secondary | ICD-10-CM

## 2023-01-23 DIAGNOSIS — Z794 Long term (current) use of insulin: Secondary | ICD-10-CM

## 2023-01-23 DIAGNOSIS — H35033 Hypertensive retinopathy, bilateral: Secondary | ICD-10-CM

## 2023-01-23 DIAGNOSIS — N186 End stage renal disease: Secondary | ICD-10-CM | POA: Diagnosis not present

## 2023-01-24 DIAGNOSIS — Z992 Dependence on renal dialysis: Secondary | ICD-10-CM | POA: Diagnosis not present

## 2023-01-24 DIAGNOSIS — N2581 Secondary hyperparathyroidism of renal origin: Secondary | ICD-10-CM | POA: Diagnosis not present

## 2023-01-24 DIAGNOSIS — N186 End stage renal disease: Secondary | ICD-10-CM | POA: Diagnosis not present

## 2023-01-26 DIAGNOSIS — I129 Hypertensive chronic kidney disease with stage 1 through stage 4 chronic kidney disease, or unspecified chronic kidney disease: Secondary | ICD-10-CM | POA: Diagnosis not present

## 2023-01-26 DIAGNOSIS — Z992 Dependence on renal dialysis: Secondary | ICD-10-CM | POA: Diagnosis not present

## 2023-01-26 DIAGNOSIS — N186 End stage renal disease: Secondary | ICD-10-CM | POA: Diagnosis not present

## 2023-01-28 ENCOUNTER — Encounter (INDEPENDENT_AMBULATORY_CARE_PROVIDER_SITE_OTHER): Payer: Medicare Other | Admitting: Ophthalmology

## 2023-01-28 DIAGNOSIS — Z961 Presence of intraocular lens: Secondary | ICD-10-CM

## 2023-01-28 DIAGNOSIS — H35033 Hypertensive retinopathy, bilateral: Secondary | ICD-10-CM

## 2023-01-28 DIAGNOSIS — E113413 Type 2 diabetes mellitus with severe nonproliferative diabetic retinopathy with macular edema, bilateral: Secondary | ICD-10-CM

## 2023-01-28 DIAGNOSIS — I10 Essential (primary) hypertension: Secondary | ICD-10-CM

## 2023-01-28 DIAGNOSIS — H25812 Combined forms of age-related cataract, left eye: Secondary | ICD-10-CM

## 2023-01-28 DIAGNOSIS — Z794 Long term (current) use of insulin: Secondary | ICD-10-CM

## 2023-02-13 ENCOUNTER — Encounter: Payer: Self-pay | Admitting: Hematology & Oncology

## 2023-02-13 ENCOUNTER — Inpatient Hospital Stay: Payer: Medicare Other | Attending: Radiation Oncology

## 2023-02-13 ENCOUNTER — Inpatient Hospital Stay (HOSPITAL_BASED_OUTPATIENT_CLINIC_OR_DEPARTMENT_OTHER): Payer: Medicare Other | Admitting: Hematology & Oncology

## 2023-02-13 VITALS — BP 137/52 | HR 73 | Temp 97.6°F | Resp 20 | Ht 67.0 in | Wt 111.1 lb

## 2023-02-13 DIAGNOSIS — E1140 Type 2 diabetes mellitus with diabetic neuropathy, unspecified: Secondary | ICD-10-CM | POA: Diagnosis not present

## 2023-02-13 DIAGNOSIS — Z992 Dependence on renal dialysis: Secondary | ICD-10-CM | POA: Insufficient documentation

## 2023-02-13 DIAGNOSIS — E1165 Type 2 diabetes mellitus with hyperglycemia: Secondary | ICD-10-CM | POA: Insufficient documentation

## 2023-02-13 DIAGNOSIS — F17210 Nicotine dependence, cigarettes, uncomplicated: Secondary | ICD-10-CM | POA: Insufficient documentation

## 2023-02-13 DIAGNOSIS — C3411 Malignant neoplasm of upper lobe, right bronchus or lung: Secondary | ICD-10-CM

## 2023-02-13 DIAGNOSIS — Z794 Long term (current) use of insulin: Secondary | ICD-10-CM | POA: Diagnosis not present

## 2023-02-13 DIAGNOSIS — Z85118 Personal history of other malignant neoplasm of bronchus and lung: Secondary | ICD-10-CM | POA: Diagnosis not present

## 2023-02-13 DIAGNOSIS — E1142 Type 2 diabetes mellitus with diabetic polyneuropathy: Secondary | ICD-10-CM

## 2023-02-13 LAB — CBC WITH DIFFERENTIAL (CANCER CENTER ONLY)
Abs Immature Granulocytes: 0.02 10*3/uL (ref 0.00–0.07)
Basophils Absolute: 0.1 10*3/uL (ref 0.0–0.1)
Basophils Relative: 1 %
Eosinophils Absolute: 0 10*3/uL (ref 0.0–0.5)
Eosinophils Relative: 0 %
HCT: 31.2 % — ABNORMAL LOW (ref 36.0–46.0)
Hemoglobin: 9.6 g/dL — ABNORMAL LOW (ref 12.0–15.0)
Immature Granulocytes: 0 %
Lymphocytes Relative: 17 %
Lymphs Abs: 1.5 10*3/uL (ref 0.7–4.0)
MCH: 29.4 pg (ref 26.0–34.0)
MCHC: 30.8 g/dL (ref 30.0–36.0)
MCV: 95.4 fL (ref 80.0–100.0)
Monocytes Absolute: 0.5 10*3/uL (ref 0.1–1.0)
Monocytes Relative: 5 %
Neutro Abs: 6.9 10*3/uL (ref 1.7–7.7)
Neutrophils Relative %: 77 %
Platelet Count: 199 10*3/uL (ref 150–400)
RBC: 3.27 MIL/uL — ABNORMAL LOW (ref 3.87–5.11)
RDW: 16.6 % — ABNORMAL HIGH (ref 11.5–15.5)
WBC Count: 8.9 10*3/uL (ref 4.0–10.5)
nRBC: 0 % (ref 0.0–0.2)

## 2023-02-13 LAB — CMP (CANCER CENTER ONLY)
ALT: 10 U/L (ref 0–44)
AST: 16 U/L (ref 15–41)
Albumin: 3.9 g/dL (ref 3.5–5.0)
Alkaline Phosphatase: 73 U/L (ref 38–126)
Anion gap: 9 (ref 5–15)
BUN: 29 mg/dL — ABNORMAL HIGH (ref 8–23)
CO2: 35 mmol/L — ABNORMAL HIGH (ref 22–32)
Calcium: 9.3 mg/dL (ref 8.9–10.3)
Chloride: 93 mmol/L — ABNORMAL LOW (ref 98–111)
Creatinine: 4.48 mg/dL — ABNORMAL HIGH (ref 0.44–1.00)
GFR, Estimated: 11 mL/min — ABNORMAL LOW (ref >60)
Glucose, Bld: 467 mg/dL — ABNORMAL HIGH (ref 70–99)
Potassium: 4.1 mmol/L (ref 3.5–5.1)
Sodium: 137 mmol/L (ref 135–145)
Total Bilirubin: 0.2 mg/dL — ABNORMAL LOW (ref 0.3–1.2)
Total Protein: 6.4 g/dL — ABNORMAL LOW (ref 6.5–8.1)

## 2023-02-13 LAB — LACTATE DEHYDROGENASE: LDH: 279 U/L — ABNORMAL HIGH (ref 98–192)

## 2023-02-13 MED ORDER — INSULIN ASPART 100 UNIT/ML IJ SOLN
15.0000 [IU] | Freq: Once | INTRAMUSCULAR | Status: AC
  Administered 2023-02-13: 15 [IU] via SUBCUTANEOUS
  Filled 2023-02-13: qty 0.15
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
## 2023-02-14 ENCOUNTER — Ambulatory Visit (HOSPITAL_BASED_OUTPATIENT_CLINIC_OR_DEPARTMENT_OTHER)
Admission: RE | Admit: 2023-02-14 | Discharge: 2023-02-14 | Disposition: A | Discharge status: Home / Self Care | Payer: Medicare Other | Source: Ambulatory Visit | Attending: Hematology & Oncology | Admitting: Hematology & Oncology

## 2023-02-14 DIAGNOSIS — Z794 Long term (current) use of insulin: Secondary | ICD-10-CM | POA: Diagnosis present

## 2023-02-14 DIAGNOSIS — C3411 Malignant neoplasm of upper lobe, right bronchus or lung: Secondary | ICD-10-CM

## 2023-02-14 DIAGNOSIS — E1142 Type 2 diabetes mellitus with diabetic polyneuropathy: Secondary | ICD-10-CM | POA: Diagnosis not present

## 2023-02-14 MED ORDER — IOHEXOL 300 MG/ML  SOLN
75.0000 mL | Freq: Once | INTRAMUSCULAR | Status: AC | PRN
  Administered 2023-02-14: 75 mL via INTRAVENOUS

## 2023-02-17 NOTE — Progress Notes (Signed)
[facility] - Clinic Note  02/19/2023     CHIEF COMPLAINT Patient presents for Retina Follow Up   HISTORY OF PRESENT ILLNESS: Amber Cross is a 62 y.o. female who presents to the clinic today for:  HPI     Retina Follow Up   Patient presents with  Diabetic Retinopathy.  In both eyes.  This started 4 weeks ago.  Duration of 4 weeks.  Since onset it is stable.  I, the attending physician,  performed the HPI with the patient and updated documentation appropriately.        Comments   4 week retina follow up NPDR OU and IVA OU pt is 1 month delayed she would like her appointment on Tuesday or Thursday pt is reporting no vision changes noticed she is seeing some floaters but denies any flashes  she is not currently checking her blood sugar at this time       Last edited by Rennis Chris, MD on 02/19/2023 10:44 PM.    Patient states that the vision is the same. She is wanting to have cataract surgery on the left eye.   Referring physician: Alma Downs, PA-C  [facility] [address] [place],  [place] [address]  HISTORICAL INFORMATION:  Selected notes from the MEDICAL RECORD NUMBER Referred by Alma Downs, PA-C for concern of CME / PDR LEE:  Ocular Hx- PMH-   CURRENT MEDICATIONS: No current outpatient medications on file. (Ophthalmic Drugs)   No current facility-administered medications for this visit. (Ophthalmic Drugs)   Current Outpatient Medications (Other)  Medication Sig   acetaminophen (TYLENOL) 500 MG tablet Take 1,000 mg by mouth every 6 (six) hours as needed for mild pain.   albuterol (VENTOLIN HFA) 108 (90 Base) MCG/ACT inhaler Inhale 2 puffs into the lungs every 6 (six) hours as needed for wheezing or shortness of breath.   amLODipine (NORVASC) 10 MG tablet Take 1 tablet (10 mg total) by mouth daily.   aspirin EC 81 MG tablet Take 1 tablet (81 mg total) by mouth daily. Swallow whole.   atorvastatin (LIPITOR) 80  MG tablet Take 1 tablet (80 mg total) by mouth daily.   B Complex-C-Zn-Folic Acid (DIALYVITE 800 WITH ZINC) 0.8 MG TABS Take 1 tablet by mouth daily. (Patient not taking: Reported on 02/13/2023)   Blood Glucose Calibration (MYGLUCOHEALTH CONTROL) SOLN 1 Application by miscellaneous route once a week. (Patient not taking: Reported on 02/13/2023)   Calcium Acetate 668 (169 Ca) MG TABS Take 1 tablet by mouth 3 (three) times daily. (Patient not taking: Reported on 02/13/2023)   carvedilol (COREG) 12.5 MG tablet Take 12.5 mg by mouth.   Continuous Blood Gluc Sensor (DEXCOM G7 SENSOR) MISC Apply each device on your skin every 10 days (Patient not taking: Reported on 02/13/2023)   Continuous Glucose Receiver (DEXCOM G7 RECEIVER) DEVI  (Patient not taking: Reported on 02/13/2023)   furosemide (LASIX) 40 MG tablet Take 1 tablet (40 mg total) by mouth daily as needed (swelling in legs or weight gain).   gabapentin (NEURONTIN) 600 MG tablet Take 600 mg by mouth daily.   glucose blood (PRECISION QID TEST) test strip  (Patient not taking: Reported on 02/13/2023)   hydrALAZINE (APRESOLINE) 50 MG tablet Take 1 tablet (50 mg total) by mouth every 8 (eight) hours.   Insulin Glargine (BASAGLAR KWIKPEN) 100 UNIT/ML Inject 10 Units into the skin daily. (Patient not taking: Reported on 02/13/2023)   Insulin Pen Needle (UNIFINE  PENTIPS) 32G X 4 MM MISC Use in the morning and at bedtime (Patient not taking: Reported on 02/13/2023)   Lancet Devices (ADJUSTABLE LANCING DEVICE) MISC  (Patient not taking: Reported on 02/13/2023)   losartan (COZAAR) 25 MG tablet Take 1 tablet (25 mg total) by mouth daily.   megestrol (MEGACE) 400 MG/10ML suspension Take 10 mLs (400 mg total) by mouth 2 (two) times daily.   sevelamer carbonate (RENVELA) 800 MG tablet Take 800 mg by mouth 3 (three) times daily with meals.   SITagliptin 25 MG TABS Take 25 mg by mouth daily before breakfast. (Patient not taking: Reported on 02/13/2023)   traZODone  (DESYREL) 50 MG tablet Take 1 tablet (50 mg total) by mouth at bedtime.   Vitamin D, Ergocalciferol, (DRISDOL) 1.25 MG (50000 UNIT) CAPS capsule Take 1 capsule (50,000 Units total) by mouth every 7 (seven) days.   No current facility-administered medications for this visit. (Other)   REVIEW OF SYSTEMS: ROS   Positive for: Musculoskeletal, Endocrine, Cardiovascular, Eyes Negative for: Constitutional, Gastrointestinal, Neurological, Skin, Genitourinary, HENT, Respiratory, Psychiatric, Allergic/Imm, Heme/Lymph Last edited by Etheleen Mayhew, COT on 02/19/2023  1:39 PM.      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   Postop check 08/05/2022   Pseudogout    Stroke (HCC) 10/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   Tamponade 06/18/2022   Tobacco abuse    Past Surgical History:  Procedure Laterality Date   ANTERIOR CRUCIATE LIGAMENT REPAIR Right    AV FISTULA PLACEMENT Left 07/01/2022   Procedure: LEFT ARTERIOVENOUS (AV) FISTULA CREATION;  Surgeon: Chuck Hint, MD;  Location: [facility] OR;  Service: Vascular;  Laterality: Left;  with regional block   BRONCHIAL BIOPSY  12/18/2021   Procedure: BRONCHIAL BIOPSIES;  Surgeon: Josephine Igo, DO;  Location: [facility] ENDOSCOPY;  Service: Pulmonary;;   BRONCHIAL BRUSHINGS  12/18/2021   Procedure: BRONCHIAL BRUSHINGS;  Surgeon: Josephine Igo, DO;  Location: [facility];  Service: Pulmonary;;    BRONCHIAL NEEDLE ASPIRATION BIOPSY  12/18/2021   Procedure: BRONCHIAL NEEDLE ASPIRATION BIOPSIES;  Surgeon: Josephine Igo, DO;  Location: [facility];  Service: Pulmonary;;   CATARACT EXTRACTION     CHEST TUBE INSERTION Right 06/18/2022   Procedure: CHEST TUBE INSERTION;  Surgeon: Lovett Sox, MD;  Location: [facility] OR;  Service: Thoracic;  Laterality: Right;   EXCHANGE OF A DIALYSIS CATHETER N/A 08/08/2022   Procedure: EXCHANGE OF A TUNNELED DIALYSIS CATHETER;  Surgeon: Nada Libman, MD;  Location: [facility] OR;  Service: Vascular;  Laterality: N/A;   FIDUCIAL MARKER PLACEMENT  12/18/2021   Procedure: FIDUCIAL MARKER PLACEMENT;  Surgeon: Josephine Igo, DO;  Location: [facility];  Service: Pulmonary;;   IR FLUORO GUIDE CV LINE RIGHT  06/26/2022   IR US GUIDE VASC ACCESS RIGHT  06/26/2022   OVARY SURGERY  RIGHT OOPHORECTOMY Right    SMALL INTESTINE SURGERY     SUBXYPHOID PERICARDIAL WINDOW N/A 06/18/2022   Procedure: SUBXYPHOID PERICARDIAL WINDOW;  Surgeon: Lovett Sox, MD;  Location: [facility] OR;  Service: Thoracic;  Laterality: N/A;   TEE WITHOUT CARDIOVERSION N/A 06/18/2022   Procedure: TRANSESOPHAGEAL ECHOCARDIOGRAM (TEE);  Surgeon: Lovett Sox, MD;  Location: [facility] OR;  Service: Thoracic;  Laterality: N/A;   TUBAL LIGATION     VIDEO BRONCHOSCOPY WITH RADIAL ENDOBRONCHIAL ULTRASOUND  12/18/2021   Procedure: RADIAL ENDOBRONCHIAL ULTRASOUND;  Surgeon: Josephine Igo, DO;  Location: [facility];  Service: Pulmonary;;   FAMILY HISTORY Family History  Problem Relation Age of Onset   Diabetes Mother    Hypertension Mother    Hypertension Father    SOCIAL HISTORY Social History   Tobacco Use   Smoking status: Every Day    Current packs/day: 0.50    Average packs/day: 0.5 packs/day for 40.0 years (20.0 ttl pk-yrs)    Types: Cigarettes   Smokeless tobacco: Never   Tobacco comments:    Cutting back 1/4-1/2 ppd  Vaping Use   Vaping status: Never Used  Substance Use Topics    Alcohol use: Not Currently   Drug use: Yes    Frequency: 4.0 times per week    Types: Marijuana    Comment: smokes every other day      OPHTHALMIC EXAM:  Base Eye Exam     Visual Acuity (Snellen - Linear)       Right Left   Dist cc 20/30 -1 20/150   Dist ph cc 20/25 -1 20/100    Correction: Glasses         Tonometry (Tonopen, 1:43 PM)       Right Left   Pressure 14 16         Pupils       Pupils Dark Light Shape React APD   Right PERRL 3 2 Round Brisk None   Left PERRL 3 2 Round Brisk None         Visual Fields       Left Right    Full Full         Extraocular Movement       Right Left    Full, Ortho Full, Ortho         Neuro/Psych     Oriented x3: Yes   Mood/Affect: Normal         Dilation     Both eyes: 2.5% Phenylephrine @ 1:44 PM           Slit Lamp and Fundus Exam     Slit Lamp Exam       Right Left   Lids/Lashes Dermatochalasis - upper lid, mild MGD Dermatochalasis - upper lid, mild MGD   Conjunctiva/Sclera nasal pingeucula, Melanosis nasal pingeucula, Melanosis   Cornea arcus, well healed cataract wound, nasal LRI, 2+ Punctate epithelial erosions arcus, 1+ Punctate epithelial erosions, trace tear film debris   Anterior Chamber deep and clear deep and clear   Iris Round and dilated Round and dilated   Lens PC IOL in good position 2+ Nuclear sclerosis with brunescence, 2-3+ Cortical cataract   Anterior Vitreous mild syneresis mild syneresis         Fundus Exam       Right Left   Disc Pink and Sharp, temporal pallor, temporal PPP Pink and Sharp, temporal pallor, temporal PPP, no NVD   C/D Ratio 0.3 0.3   Macula Good foveal reflex,  scattered MA, DBH and punctate exudates, cystic changes -- improved Blunted foveal reflex, dense central cluster of exudates -- slightly improved, scattered MA, DBH, central edema -- slightly improved   Vessels attenuated, Tortuous, no NV attenuated, Tortuous, no NV   Periphery Attached,  scattered MA Attached, scattered MA           Refraction     Wearing Rx       Sphere Cylinder Axis   Right -2.50 +0.50 158   Left -3.50 +0.75 151           IMAGING AND PROCEDURES  Imaging and Procedures for 02/19/2023  OCT, Retina - OU - Both Eyes       Right Eye Quality was good. Central Foveal Thickness: 233. Progression has improved. Findings include normal foveal contour, no SRF, intraretinal hyper-reflective material, intraretinal fluid (Mild interval improvement in IRF / IRHM greatest ST mac).   Left Eye Quality was good. Central Foveal Thickness: 199. Progression has improved. Findings include no SRF, abnormal foveal contour, subretinal hyper-reflective material, intraretinal hyper-reflective material, intraretinal fluid, outer retinal atrophy (Mild interval improvement in IRF/IRHM).   Notes *Images captured and stored on drive  Diagnosis / Impression:  DME OU OD: Mild interval improvement in IRF / IRHM greatest ST mac OS: Mild interval improvement in IRF/IRHM  Clinical management:  See below  Abbreviations: NFP - Normal foveal profile. CME - cystoid macular edema. PED - pigment epithelial detachment. IRF - intraretinal fluid. SRF - subretinal fluid. EZ - ellipsoid zone. ERM - epiretinal membrane. ORA - outer retinal atrophy. ORT - outer retinal tubulation. SRHM - subretinal hyper-reflective material. IRHM - intraretinal hyper-reflective material      Intravitreal Injection, Pharmacologic Agent - OD - Right Eye       Time Out 02/19/2023. 2:26 PM. Confirmed correct patient, procedure, site, and patient consented.   Anesthesia Topical anesthesia was used. Anesthetic medications included Lidocaine 2%, Proparacaine 0.5%.   Procedure Preparation included 5% betadine to ocular surface, eyelid speculum. A supplied needle was used.   Injection: 1.25 mg Bevacizumab 1.25mg /0.50ml   Route: Intravitreal, Site: Right Eye   NDC: P3213405, Lot: [id number],  Expiration date: 03/09/2023   Post-op Post injection exam found visual acuity of at least counting fingers. The patient tolerated the procedure well. There were no complications. The patient received written and verbal post procedure care education.      Intravitreal Injection, Pharmacologic Agent - OS - Left Eye       Time Out 02/19/2023. 2:26 PM. Confirmed correct patient, procedure, site, and patient consented.   Anesthesia Topical anesthesia was used. Anesthetic medications included Lidocaine 2%, Proparacaine 0.5%.   Procedure Preparation included 5% betadine to ocular surface, eyelid speculum. A (32g) needle was used.   Injection: 1.25 mg Bevacizumab 1.25mg /0.15ml   Route: Intravitreal, Site: Left Eye   NDC: P3213405, Lot: [id number], Expiration date: 05/05/2023   Post-op Post injection exam found visual acuity of at least counting fingers. The patient tolerated the procedure well. There were no complications. The patient received written and verbal post procedure care education.           ASSESSMENT/PLAN:  1,2 Severe Non-proliferative diabetic retinopathy, both eyes - s/p IVA OD #1 (05.02.24), #2 (05.30.24)  - s/p IVA OS #1 (04.04.24), #2 (05.02.24), #3 (05.30.24)  - last A1c was 10.4 on 12/05/22, 7.4 on 3.15.24 - BCVA 20/25 OD, 20/100 OS - exam shows scattered MA/DBH and exudates OU (OS > OD) - FA (04.04.24) shows leaking MA greatest  posteriorly, no NV OU - OCT shows OD: Mild interval improvement in IRF / IRHM greatest ST mac OS: Mild interval improvement in IRF/IRHM - recommend IVA OU (OD #3 and OS #4) today, 07.24.24 for DME - pt wishes to proceed - RBA of procedure discussed, questions answered - IVA informed consent obtained and signed, 04.04.24 (OU) - see procedure note - f/u in 4 wks -- DFE/OCT, possible injection  3,4. Hypertensive retinopathy OU - discussed importance of tight BP control - monitor  5. Mixed Cataract OS - The symptoms of cataract,  surgical options, and treatments and risks were discussed with patient. - discussed diagnosis and progression - under the expert management of [facility] - pt wishes to have cataract surgery this year in 2024 - clear from a retina standpoint to proceed with cataract surgery when pt and surgeon are ready  - will refer back to [facility] for cataract f/u  6. Pseudophakia OD  - s/p CE/IOL OD (Dr. Dione Booze, 2017)  - IOL in good position, doing well  - monitor  Ophthalmic Meds Ordered this visit:  Meds ordered this encounter  Medications   Bevacizumab (AVASTIN) SOLN 1.25 mg   Bevacizumab (AVASTIN) SOLN 1.25 mg    Return in about 4 weeks (around 03/19/2023) for f/u NPDR OU , DFE, OCT, Possible, IVA, OU.  There are no Patient Instructions on file for this visit.  Explained the diagnoses, plan, and follow up with the patient and they expressed understanding.  Patient expressed understanding of the importance of proper follow up care.   This document serves as a record of services personally performed by Karie Chimera, MD, PhD. It was created on their behalf by De Blanch, an ophthalmic technician. The creation of this record is the provider's dictation and/or activities during the visit.    Electronically signed by: De Blanch, OA, 02/19/23  10:48 PM  This document serves as a record of services personally performed by Karie Chimera, MD, PhD. It was created on their behalf by Gerilyn Nestle, COT an ophthalmic technician. The creation of this record is the provider's dictation and/or activities during the visit.    Electronically signed by:  Charlette Caffey, COT  02/19/23 10:48 PM  Karie Chimera, M.D., Ph.D. Diseases & Surgery of the Retina and Vitreous [facility] 02/19/2023  I have reviewed the above documentation for accuracy and completeness, and I agree with the above. Karie Chimera, M.D., Ph.D. 02/19/23 10:50  PM   Abbreviations: M myopia (nearsighted); A astigmatism; H hyperopia (farsighted); P presbyopia; Mrx spectacle prescription;  CTL contact lenses; OD right eye; OS left eye; OU both eyes  XT exotropia; ET esotropia; PEK punctate epithelial keratitis; PEE punctate epithelial erosions; DES dry eye syndrome; MGD meibomian gland dysfunction; ATs artificial tears; PFAT's preservative free artificial tears; NSC nuclear sclerotic cataract; PSC posterior subcapsular cataract; ERM epi-retinal membrane; PVD posterior vitreous detachment; RD retinal detachment; DM diabetes mellitus; DR diabetic retinopathy; NPDR non-proliferative diabetic retinopathy; PDR proliferative diabetic retinopathy; CSME clinically significant macular edema; DME diabetic macular edema; dbh dot blot hemorrhages; CWS cotton wool spot; POAG primary open angle glaucoma; C/D cup-to-disc ratio; HVF humphrey visual field; GVF goldmann visual field; OCT optical coherence tomography; IOP intraocular pressure; BRVO Branch retinal vein occlusion; CRVO central retinal vein occlusion; CRAO central retinal artery occlusion; BRAO branch retinal artery occlusion; RT retinal tear; SB scleral buckle; PPV pars plana vitrectomy; VH Vitreous hemorrhage; PRP panretinal laser photocoagulation; IVK intravitreal  kenalog; VMT vitreomacular traction; MH Macular hole;  NVD neovascularization of the disc; NVE neovascularization elsewhere; AREDS age related eye disease study; ARMD age related macular degeneration; POAG primary open angle glaucoma; EBMD epithelial/anterior basement membrane dystrophy; ACIOL anterior chamber intraocular lens; IOL intraocular lens; PCIOL posterior chamber intraocular lens; Phaco/IOL phacoemulsification with intraocular lens placement; PRK photorefractive keratectomy; LASIK laser assisted in situ keratomileusis; HTN hypertension; DM diabetes mellitus; COPD chronic obstructive pulmonary disease

## 2023-02-19 ENCOUNTER — Ambulatory Visit (INDEPENDENT_AMBULATORY_CARE_PROVIDER_SITE_OTHER): Payer: Medicare Other | Admitting: Ophthalmology

## 2023-02-19 ENCOUNTER — Encounter (INDEPENDENT_AMBULATORY_CARE_PROVIDER_SITE_OTHER): Payer: Self-pay | Admitting: Ophthalmology

## 2023-02-19 DIAGNOSIS — Z794 Long term (current) use of insulin: Secondary | ICD-10-CM

## 2023-02-19 DIAGNOSIS — I10 Essential (primary) hypertension: Secondary | ICD-10-CM | POA: Diagnosis not present

## 2023-02-19 DIAGNOSIS — H25812 Combined forms of age-related cataract, left eye: Secondary | ICD-10-CM

## 2023-02-19 DIAGNOSIS — H35033 Hypertensive retinopathy, bilateral: Secondary | ICD-10-CM

## 2023-02-19 DIAGNOSIS — E113413 Type 2 diabetes mellitus with severe nonproliferative diabetic retinopathy with macular edema, bilateral: Secondary | ICD-10-CM | POA: Diagnosis not present

## 2023-02-19 DIAGNOSIS — Z961 Presence of intraocular lens: Secondary | ICD-10-CM

## 2023-02-19 MED ORDER — BEVACIZUMAB CHEMO INJECTION 1.25MG/0.05ML SYRINGE FOR KALEIDOSCOPE
1.2500 mg | INTRAVITREAL | Status: AC | PRN
Start: 2023-02-19 — End: 2023-02-19
  Administered 2023-02-19: 1.25 mg via INTRAVITREAL

## 2023-03-11 ENCOUNTER — Encounter: Payer: Self-pay | Admitting: Student

## 2023-03-11 ENCOUNTER — Other Ambulatory Visit: Payer: Self-pay

## 2023-03-11 ENCOUNTER — Ambulatory Visit (INDEPENDENT_AMBULATORY_CARE_PROVIDER_SITE_OTHER): Payer: Medicare Other

## 2023-03-11 ENCOUNTER — Ambulatory Visit (INDEPENDENT_AMBULATORY_CARE_PROVIDER_SITE_OTHER): Payer: Medicare Other | Admitting: Student

## 2023-03-11 VITALS — BP 119/50 | HR 82 | Temp 97.5°F | Ht 67.0 in | Wt 115.2 lb

## 2023-03-11 VITALS — BP 119/50 | HR 82 | Ht 67.0 in | Wt 115.7 lb

## 2023-03-11 DIAGNOSIS — N186 End stage renal disease: Secondary | ICD-10-CM | POA: Diagnosis not present

## 2023-03-11 DIAGNOSIS — I120 Hypertensive chronic kidney disease with stage 5 chronic kidney disease or end stage renal disease: Secondary | ICD-10-CM | POA: Diagnosis not present

## 2023-03-11 DIAGNOSIS — I10 Essential (primary) hypertension: Secondary | ICD-10-CM

## 2023-03-11 DIAGNOSIS — L299 Pruritus, unspecified: Secondary | ICD-10-CM

## 2023-03-11 DIAGNOSIS — Z0000 Encounter for general adult medical examination without abnormal findings: Secondary | ICD-10-CM

## 2023-03-11 DIAGNOSIS — Z992 Dependence on renal dialysis: Secondary | ICD-10-CM

## 2023-03-11 DIAGNOSIS — E1142 Type 2 diabetes mellitus with diabetic polyneuropathy: Secondary | ICD-10-CM

## 2023-03-11 DIAGNOSIS — F17210 Nicotine dependence, cigarettes, uncomplicated: Secondary | ICD-10-CM

## 2023-03-11 DIAGNOSIS — Z794 Long term (current) use of insulin: Secondary | ICD-10-CM | POA: Diagnosis not present

## 2023-03-11 DIAGNOSIS — R634 Abnormal weight loss: Secondary | ICD-10-CM

## 2023-03-11 LAB — POCT GLYCOSYLATED HEMOGLOBIN (HGB A1C): Hemoglobin A1C: 7.8 % — AB (ref 4.0–5.6)

## 2023-03-11 LAB — GLUCOSE, CAPILLARY: Glucose-Capillary: 262 mg/dL — ABNORMAL HIGH (ref 70–99)

## 2023-03-11 MED ORDER — LOSARTAN POTASSIUM 25 MG PO TABS
25.0000 mg | ORAL_TABLET | Freq: Every day | ORAL | 11 refills | Status: DC
Start: 2023-03-11 — End: 2023-11-20

## 2023-03-11 NOTE — Patient Instructions (Signed)

## 2023-03-11 NOTE — Progress Notes (Signed)
Established Patient Office Visit  Subjective   Patient ID: Amber Cross, female    DOB: [birth year]  Age: 62 y.o. MRN: [id number]  Chief Complaint  Patient presents with   Follow-up    ROUTINE OFFICE  / HEAD ACHE #8    Patient is a 62 y.o. with a past medical history stated below who presents today for follow-up for HTN, diabetes management, itchy patches of skin, and a new complaint of fecal incontinence in need of supplies. Please see problem based assessment and plan for additional details.    Patient's last visit 12/05/22 with Dr. Cloretta Ned. She reports taking amlodipine, hydralazine, and coreg daily, but has been out of losartan 25 for the past few days. She does not log her blood pressures at home. Today she denies CP, SOB, palpitations, headache, or vision changes.   Dr. Cloretta Ned instructed patient to start simagliptin 25 daily, continue basal insulin 10 units daily, but patient reports she never started the sitagliptin and she cannot afford the insulin. She reports she recently got Medicaid in July and may be able to afford medications moving forward. She has had a CGM in the past doesn't know where it is, doesn't check glucose levels. She reports some weight loss in the past few weeks and decreased appetite. Endorses nausea, vomiting, and intermittent abdominal pain/discomfort. Patient is on dialysis, reported she is not making urine.   Patient showed itchy patches of skin on left lower leg and left upper chest. Skin is dry and rough to the touch. Has been trying to use vaseline on and off but feels like is constantly scratching. Also reported she has been dealing with fecal incontinence for the past year and requested supplied.   Of note, patient recently seen by Dr. Myna Hidalgo (oncology) on 02/13/23 for PET scan results and concerns that her small cell lung cancer has returned. Patient reports he has not gone over the last few lab results with her but has an upcoming appointment with him on  03/13/23.     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 Negative for chills and fever.  HENT:  Negative for hearing loss.   Eyes:  Negative for blurred vision.  Respiratory:  Positive for wheezing. Negative for shortness of breath.   Cardiovascular:  Negative for chest pain and palpitations.  Gastrointestinal:  Positive for abdominal pain, diarrhea, nausea and vomiting.  Genitourinary:  Positive for dysuria.  Skin:  Positive for itching.  Neurological:  Negative for dizziness and headaches.      Objective:     BP (!) 119/50 (BP Location: Right Arm, Patient Position: Sitting, Cuff Size: Normal)   Pulse 82   Ht 5\' 7"  (1.702 m)   Wt 115 lb 11.2 oz (52.5 kg)   LMP 09/29/2011   SpO2 98%   BMI 18.12  kg/m  BP Readings from Last 3 Encounters:  03/11/23 (!) 119/50  03/11/23 (!) 119/50  02/13/23 (!) 137/52   Wt Readings from Last 3 Encounters:  03/11/23 115 lb 3.2 oz (52.3 kg)  03/11/23 115 lb 11.2 oz (52.5 kg)  02/13/23 111 lb 1.9 oz (50.4 kg)    Physical Exam Constitutional:      Comments: Cachexic appearance  HENT:     Head: Normocephalic and atraumatic.     Mouth/Throat:     Mouth: Mucous membranes are moist.  Eyes:     Extraocular Movements: Extraocular movements intact.     Pupils: Pupils are equal, round, and reactive to light.  Cardiovascular:     Rate and Rhythm: Normal rate and regular rhythm.     Pulses:  Normal pulses.     Heart sounds: No murmur heard. Pulmonary:     Effort: Pulmonary effort is normal.     Breath sounds: Wheezing present.  Abdominal:     General: Bowel sounds are normal.     Palpations: Abdomen is soft.     Comments: Abdomen appears slightly distended, could palpate several nodes lateral to the umbilicus on both sides.  Musculoskeletal:        General: No swelling.  Skin:    General: Skin is warm and dry.     Comments: Dry, rough patches of skin on lower left leg and upper left chest  Neurological:     General: No focal deficit present.  Psychiatric:     Comments: Patient appeared sad, fatigued, and slightly withdrawn    Results for orders placed or performed in visit on 03/11/23  Glucose, capillary  Result Value Ref Range   Glucose-Capillary 262 (H) 70 - 99 mg/dL  Results for orders placed or performed in visit on 03/11/23  CMP14 + Anion Gap  Result Value Ref Range   Glucose 278 (H) 70 - 99 mg/dL   BUN 26 8 - 27 mg/dL   Creatinine, Ser 1.61 (H) 0.57 - 1.00 mg/dL   eGFR 13 (L) >09 UE/AVW/0.98   BUN/Creatinine Ratio 7 (L) 12 - 28   Sodium WILL FOLLOW    Potassium WILL FOLLOW    Chloride WILL FOLLOW    CO2 25 20 - 29 mmol/L   Anion Gap WILL FOLLOW    Calcium 8.4 (L) 8.7 - 10.3 mg/dL   Total Protein 6.0 6.0 - 8.5 g/dL   Albumin 3.6 (L) 3.9 - 4.9 g/dL   Globulin, Total 2.4 1.5 - 4.5 g/dL   Bilirubin Total <1.1 0.0 - 1.2 mg/dL   Alkaline Phosphatase 109 44 - 121 IU/L   AST 23 0 - 40 IU/L   ALT 37 (H) 0 - 32 IU/L  TSH  Result Value Ref Range   TSH 0.051 (L) 0.450 - 4.500 uIU/mL  POC Hbg A1C  Result Value Ref Range   Hemoglobin A1C 7.8 (A) 4.0 - 5.6 %   HbA1c POC (<> result, manual entry)     HbA1c, POC (prediabetic range)     HbA1c, POC (controlled diabetic range)     Last hemoglobin A1c Lab Results  Component Value Date   HGBA1C 7.8 (A) 03/11/2023   The ASCVD Risk score (Arnett DK, et al., 2019) failed to calculate for the following reasons:    The patient has a prior MI or stroke diagnosis    Assessment & Plan:   Problem List Items Addressed This Visit       Cardiovascular and Mediastinum  Essential hypertension (Chronic)    Blood pressures today 149/55 and 167/61. Patient taking amlodipine, hydralazine, and coreg daily, but has been out of losartan 25 mg daily for the past few days. CV exam unremarkable, patient denies CP, SOB, palpitations, headache, or vision changes. Will continue to monitor BP and ensure patient is taking all medications before making adjustments.  Plan:  - Refill Losartan 25 mg daily  - Encouraged patient to keep a BP log       Relevant Medications   losartan (COZAAR) 25 MG tablet     Endocrine   Type 2 diabetes mellitus with diabetic polyneuropathy (HCC)    Patient reports she never started the sitagliptin and she cannot afford the insulin. Got Medicaid for the next six months in July and may be able to afford medications moving forward. Doesn't currently use a CGM device but has used on in the past. Endorses nausea, vomiting, weight loss, and intermittent abdominal pain/discomfort. Patient is on dialysis and does not produce urine. Today's A1C is 7.8% compared to 10.4% 12/05/22.   Plan:  - Ok to hold off on insulin and sitagliptin for now. Will have patient see Lupita Leash at follow up visit to discuss diabetes management.       Relevant Medications   losartan (COZAAR) 25 MG tablet   Other Relevant Orders   CMP14 + Anion Gap (Completed)   POC Hbg A1C (Completed)     Genitourinary   ESRD (end stage renal disease) (HCC) - Primary (Chronic)    Patient continues to go to dialysis M-W-F, not producing own urine. Continue to monitor.         Other   Weight loss    Patient reports recent weight loss (per chart review, patient up 4 pounds from 7/18) and diarrhea for the past year. Has not been seen by gastroenterology or received a colonoscopy in the past. Patient following with oncology, Dr. Myna Hidalgo,  for history of small cell lung cancer. Per Dr. Gustavo Lah note, there is concern that her cancer has returned. Patient with bilateral abdominal nodules (potentially swollen lymph nodes) and slightly distended abdomen on exam. Normal respiratory effort and lung sounds today. She has a follow up appointment on 8/15 with him to determine next steps.  Plan:  - TSH lab today, will follow up as needed - Provided DME for fecal incontinence supplies today  - GI referral today       Relevant Orders   TSH (Completed)   Ambulatory Referral for DME   Itch    Patient with new complaint of dry, itchy skin patches on left lower leg and left upper chest. Skin appears to be eczematous on the left leg and extremely dry on upper chest. Encouraged continued use of Vaseline.  Plan:  - Recommended over the counter sarna lotion as well as over the counter zyrtec for itch relief.       Other Visit Diagnoses     Health care maintenance       Relevant Orders   Ambulatory referral to Gastroenterology       Return in about 4 weeks (around 04/08/2023) for Follow up on HTN, Diabetes, Oncology visit .   Patient seen with Dr. Mikey Bussing.   Colbert Coyer, MD

## 2023-03-11 NOTE — Progress Notes (Signed)
Subjective:   Amber Cross is a 62 y.o. female who presents for an Initial Medicare Annual Wellness Visit.  Visit Complete: In person    Review of Systems    DEFERRED TO PCP  Cardiac Risk Factors include: advanced age (>24men, >20 women);diabetes mellitus;hypertension;dyslipidemia;smoking/ tobacco exposure     Objective:    Today's Vitals   03/11/23 1204  BP: (!) 119/50  Pulse: 82  Temp: (!) 97.5 F (36.4 C)  TempSrc: Oral  SpO2: 98%  Weight: 115 lb 3.2 oz (52.3 kg)  Height: 5\' 7"  (1.702 m)  PainSc: 8    Body mass index is 18.04 kg/m.     03/11/2023   11:57 AM 02/13/2023    2:52 PM 01/08/2023   12:11 PM 12/05/2022   10:20 AM 10/31/2022    2:33 PM 09/05/2022    3:25 PM 08/08/2022    8:20 AM  Advanced Directives  Does Patient Have a Medical Advance Directive? No;Yes Yes Yes Yes Yes Yes No  Type of Advance Directive Healthcare Power of Attorney Living will Living will Living will;Healthcare Power of Attorney Living will Healthcare Power of [place];Living will   Does patient want to make changes to medical advance directive?    No - Patient declined No - Patient declined No - Patient declined   Copy of Healthcare Power of Attorney in Chart?  Yes - validated most recent copy scanned in chart (See row information) Yes - validated most recent copy scanned in chart (See row information) Yes - validated most recent copy scanned in chart (See row information)  Yes - validated most recent copy scanned in chart (See row information)   Would patient like information on creating a medical advance directive?  No - Patient declined No - Patient declined    No - Patient declined    Current Medications (verified) Outpatient Encounter Medications as of 03/11/2023  Medication Sig   acetaminophen (TYLENOL) 500 MG tablet Take 1,000 mg by mouth every 6 (six) hours as needed for mild pain.   albuterol (VENTOLIN HFA) 108 (90 Base) MCG/ACT inhaler Inhale 2 puffs into the lungs every 6 (six)  hours as needed for wheezing or shortness of breath.   amLODipine (NORVASC) 10 MG tablet Take 1 tablet (10 mg total) by mouth daily.   aspirin EC 81 MG tablet Take 1 tablet (81 mg total) by mouth daily. Swallow whole.   atorvastatin (LIPITOR) 80 MG tablet Take 1 tablet (80 mg total) by mouth daily.   B Complex-C-Zn-Folic Acid (DIALYVITE 800 WITH ZINC) 0.8 MG TABS Take 1 tablet by mouth daily.   Blood Glucose Calibration (MYGLUCOHEALTH CONTROL) SOLN    Calcium Acetate 668 (169 Ca) MG TABS Take 1 tablet by mouth 3 (three) times daily.   carvedilol (COREG) 12.5 MG tablet Take 12.5 mg by mouth.   Continuous Blood Gluc Sensor (DEXCOM G7 SENSOR) MISC Apply each device on your skin every 10 days   Continuous Glucose Receiver (DEXCOM G7 RECEIVER) DEVI    furosemide (LASIX) 40 MG tablet Take 1 tablet (40 mg total) by mouth daily as needed (swelling in legs or weight gain).   gabapentin (NEURONTIN) 600 MG tablet Take 600 mg by mouth daily.   glucose blood (PRECISION QID TEST) test strip    hydrALAZINE (APRESOLINE) 50 MG tablet Take 1 tablet (50 mg total) by mouth every 8 (eight) hours.   Insulin Glargine (BASAGLAR KWIKPEN) 100 UNIT/ML Inject 10 Units into the skin daily.   Insulin Pen Needle (UNIFINE PENTIPS)  32G X 4 MM MISC Use in the morning and at bedtime   Lancet Devices (ADJUSTABLE LANCING DEVICE) MISC    losartan (COZAAR) 25 MG tablet Take 1 tablet (25 mg total) by mouth daily.   megestrol (MEGACE) 400 MG/10ML suspension Take 10 mLs (400 mg total) by mouth 2 (two) times daily.   sevelamer carbonate (RENVELA) 800 MG tablet Take 800 mg by mouth 3 (three) times daily with meals.   SITagliptin 25 MG TABS Take 25 mg by mouth daily before breakfast.   traZODone (DESYREL) 50 MG tablet Take 1 tablet (50 mg total) by mouth at bedtime.   Vitamin D, Ergocalciferol, (DRISDOL) 1.25 MG (50000 UNIT) CAPS capsule Take 1 capsule (50,000 Units total) by mouth every 7 (seven) days.   No facility-administered  encounter medications on file as of 03/11/2023.    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   Postop check 08/05/2022   Pseudogout    Stroke (HCC) 10/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   Tamponade 06/18/2022   Tobacco abuse    Past Surgical History:  Procedure Laterality Date   ANTERIOR CRUCIATE LIGAMENT REPAIR Right    AV FISTULA PLACEMENT Left 07/01/2022   Procedure: LEFT ARTERIOVENOUS (AV) FISTULA CREATION;  Surgeon: Chuck Hint, MD;  Location: [facility];  Service: Vascular;  Laterality: Left;  with regional block   BRONCHIAL BIOPSY  12/18/2021   Procedure: BRONCHIAL BIOPSIES;  Surgeon: Josephine Igo, DO;  Location: [facility] ENDOSCOPY;  Service: Pulmonary;;   BRONCHIAL BRUSHINGS  12/18/2021   Procedure: BRONCHIAL BRUSHINGS;  Surgeon: Josephine Igo, DO;  Location: [facility];  Service: Pulmonary;;   BRONCHIAL NEEDLE ASPIRATION BIOPSY  12/18/2021   Procedure: BRONCHIAL NEEDLE ASPIRATION BIOPSIES;  Surgeon: Josephine Igo, DO;  Location: [facility];  Service: Pulmonary;;   CATARACT EXTRACTION     CHEST TUBE INSERTION Right 06/18/2022   Procedure: CHEST TUBE INSERTION;  Surgeon: Lovett Sox, MD;  Location: [facility] OR;  Service: Thoracic;  Laterality: Right;   EXCHANGE OF A DIALYSIS CATHETER N/A 08/08/2022   Procedure: EXCHANGE OF A TUNNELED DIALYSIS CATHETER;  Surgeon: Nada Libman, MD;  Location: [facility] OR;  Service: Vascular;  Laterality: N/A;   FIDUCIAL MARKER PLACEMENT  12/18/2021   Procedure: FIDUCIAL MARKER PLACEMENT;  Surgeon: Josephine Igo, DO;  Location: [facility];  Service: Pulmonary;;   IR FLUORO GUIDE CV LINE RIGHT  06/26/2022   IR US GUIDE VASC ACCESS RIGHT  06/26/2022   OVARY SURGERY     RIGHT OOPHORECTOMY Right    SMALL INTESTINE SURGERY     SUBXYPHOID PERICARDIAL WINDOW N/A 06/18/2022   Procedure: SUBXYPHOID PERICARDIAL WINDOW;  Surgeon: Lovett Sox, MD;  Location: [facility] OR;  Service: Thoracic;  Laterality: N/A;   TEE WITHOUT CARDIOVERSION N/A 06/18/2022   Procedure: TRANSESOPHAGEAL ECHOCARDIOGRAM (TEE);  Surgeon: Lovett Sox, MD;  Location: [facility] OR;  Service: Thoracic;  Laterality: N/A;   TUBAL LIGATION     VIDEO BRONCHOSCOPY WITH RADIAL ENDOBRONCHIAL ULTRASOUND  12/18/2021   Procedure: RADIAL ENDOBRONCHIAL ULTRASOUND;  Surgeon: Josephine Igo, DO;  Location: [facility];  Service: Pulmonary;;   Family History  Problem Relation Age of Onset   Diabetes Mother    Hypertension Mother    Hypertension Father    Social History   Socioeconomic History   Marital status: Married    Spouse name: Not on file   Number of children: Not on file   Years of education: Not on file   Highest education level: Not on file  Occupational History   Not on file  Tobacco Use   Smoking status: Every Day    Current packs/day: 0.50    Average packs/day: 0.5 packs/day for 40.0 years (20.0 ttl pk-yrs)    Types: Cigarettes   Smokeless tobacco: Never   Tobacco comments:    Cutting back 1/4-1/2 ppd  Vaping Use   Vaping status: Never Used  Substance and Sexual Activity   Alcohol use: Not Currently   Drug use: Yes    Frequency: 4.0 times per week    Types: Marijuana    Comment: smokes every other day   Sexual activity: Not Currently    Birth control/protection: Abstinence  Other Topics Concern   Not on file  Social History Narrative   Not on file   Social Determinants of Health   Financial Resource Strain: High Risk (03/11/2023)   Overall Financial Resource Strain (CARDIA)    Difficulty of Paying Living  Expenses: Hard  Food Insecurity: Food Insecurity Present (03/11/2023)   Hunger Vital Sign    Worried About Running Out of Food in the Last Year: Sometimes true    Ran Out of Food in the Last Year: Sometimes true  Transportation Needs: No Transportation Needs (03/11/2023)   PRAPARE - Administrator, Civil Service (Medical): No    Lack of Transportation (Non-Medical): No  Physical Activity: Inactive (03/11/2023)   Exercise Vital Sign    Days of Exercise per Week: 0 days    Minutes of Exercise per Session: 0 min  Stress: Stress Concern Present (03/11/2023)   [employer] of Occupational Health - Occupational Stress Questionnaire    Feeling of Stress : To some extent  Social Connections: Socially Isolated (03/11/2023)   Social Connection and Isolation Panel [NHANES]    Frequency of Communication with Friends and Family: Never    Frequency of Social Gatherings with Friends and Family: Never    Attends Religious Services: Never    Diplomatic Services operational officer: No    Attends Engineer, structural: Never    Marital Status: Never married    Tobacco Counseling Ready to quit: Not Answered Counseling given: Not Answered Tobacco comments: Cutting back 1/4-1/2 ppd   Clinical Intake:  Pre-visit preparation completed: Yes  Pain : 0-10 Pain Score: 8  Pain Type: Acute pain Pain Location: Head Pain Orientation: Left Pain Descriptors / Indicators: Aching, Constant Pain Onset: Today     BMI - recorded: 18 Nutritional Status: BMI <19  Underweight Nutritional Risks: None Diabetes: Yes CBG done?: No Did pt. bring in CBG monitor from home?: No  How often do you need to have someone help you when you read instructions, pamphlets, or other written materials from your doctor or pharmacy?: 1 - Never What is the last grade level you completed in school?: COLLEGE  Interpreter Needed?: No  Information entered by :: [facility]   Activities of Daily  Living    03/11/2023   11:56  AM 12/05/2022   10:18 AM  In your present state of health, do you have any difficulty performing the following activities:  Hearing? 0 0  Vision? 1 1  Difficulty concentrating or making decisions? 0 0  Walking or climbing stairs? 0 1  Dressing or bathing? 0 1  Doing errands, shopping? 0 1  Preparing Food and eating ? N   Using the Toilet? N   In the past six months, have you accidently leaked urine? N   Do you have problems with loss of bowel control? Y   Managing your Medications? N   Managing your Finances? N   Housekeeping or managing your Housekeeping? N     Patient Care Team: Kathleen Lime, MD as PCP - General Thomasene Ripple, DO as PCP - Cardiology (Cardiology) [place], [facility]  Indicate any recent Medical Services you may have received from other than [facility] providers in the past year (date may be approximate).     Assessment:   This is a routine wellness examination for Amber Cross.  Hearing/Vision screen No results found.  Dietary issues and exercise activities discussed:     Goals Addressed   None   Depression Screen    03/11/2023   12:07 PM 03/11/2023   11:01 AM 12/05/2022   10:26 AM 09/05/2022    3:26 PM 03/19/2022    4:16 PM 02/20/2022   11:03 AM 02/11/2022    4:13 PM  PHQ 2/9 Scores  PHQ - 2 Score 0 0 0 0 5 3 6   PHQ- 9 Score  1  7 20 13 18     Fall Risk    03/11/2023   11:58 AM 03/11/2023   11:01 AM 12/05/2022   10:17 AM 03/19/2022    2:37 PM 02/20/2022   11:03 AM  Fall Risk   Falls in the past year? 0 0 0 1 1  Number falls in past yr: 0  0 1 1  Injury with Fall? 0  0 1 1  Risk for fall due to : Impaired balance/gait No Fall Risks Impaired balance/gait Impaired balance/gait Impaired balance/gait  Follow up Falls prevention discussed;Falls evaluation completed Falls evaluation completed Falls evaluation completed Falls evaluation completed Falls evaluation completed    MEDICARE RISK AT HOME:  Medicare Risk at Home  - 03/11/23 1158     Any stairs in or around the home? Yes    If so, are there any without handrails? No    Home free of loose throw rugs in walkways, pet beds, electrical cords, etc? Yes    Adequate lighting in your home to reduce risk of falls? Yes    Life alert? No    Use of a cane, walker or w/c? Yes    Grab bars in the bathroom? No    Shower chair or bench in shower? No    Elevated toilet seat or a handicapped toilet? No             TIMED UP AND GO:  Was the test performed? No    Cognitive Function:        03/11/2023   11:58 AM  6CIT Screen  What Year? 0 points  What month? 0 points  What time? 0 points  Count back from 20 0 points  Months in reverse 0 points  Repeat phrase 0 points  Total Score 0 points    Immunizations Immunization History  Administered Date(s) Administered   Influenza,inj,Quad PF,6+ Mos 07/03/2017, 04/16/2021   Influenza,inj,quad, With Preservative  07/03/2017, 04/16/2021   Influenza-Unspecified 07/03/2017   PFIZER(Purple Top)SARS-COV-2 Vaccination 10/23/2019, 11/13/2019   Pneumococcal Polysaccharide-23 08/27/2017   Tdap 05/29/2021    TDAP status: Up to date  Flu Vaccine status: Due, Education has been provided regarding the importance of this vaccine. Advised may receive this vaccine at local pharmacy or Health Dept. Aware to provide a copy of the vaccination record if obtained from local pharmacy or Health Dept. Verbalized acceptance and understanding.    Covid-19 vaccine status: Completed vaccines  Qualifies for Shingles Vaccine? No   Zostavax completed No   Shingrix Completed?: No.    Education has been provided regarding the importance of this vaccine. Patient has been advised to call insurance company to determine out of pocket expense if they have not yet received this vaccine. Advised may also receive vaccine at local pharmacy or Health Dept. Verbalized acceptance and understanding.  Screening Tests Health Maintenance  Topic  Date Due   OPHTHALMOLOGY EXAM  Never done   Zoster Vaccines- Shingrix (1 of 2) Never done   PAP SMEAR-Modifier  Never done   Colonoscopy  Never done   MAMMOGRAM  Never done   COVID-19 Vaccine (3 - Pfizer risk series) 12/11/2019   INFLUENZA VACCINE  02/27/2023   HEMOGLOBIN A1C  06/07/2023   FOOT EXAM  12/05/2023   Medicare Annual Wellness (AWV)  03/10/2024   DTaP/Tdap/Td (2 - Td or Tdap) 05/30/2031   Hepatitis C Screening  Completed   HIV Screening  Completed   HPV VACCINES  Aged Out    Health Maintenance  Health Maintenance Due  Topic Date Due   OPHTHALMOLOGY EXAM  Never done   Zoster Vaccines- Shingrix (1 of 2) Never done   PAP SMEAR-Modifier  Never done   Colonoscopy  Never done   MAMMOGRAM  Never done   COVID-19 Vaccine (3 - Pfizer risk series) 12/11/2019   INFLUENZA VACCINE  02/27/2023      Mammogram status: Ordered ORDER WAS PLACED  SINCE 12/05/2022    Lung Cancer Screening: (Low Dose CT Chest recommended if Age 61-80 years, 20 pack-year currently smoking OR have quit w/in 15years.) does not qualify.   Lung Cancer Screening Referral: DEFERRED TO PCP   Additional Screening:  Hepatitis C Screening: does qualify; Completed 12/05/2022  Vision Screening: Recommended annual ophthalmology exams for early detection of glaucoma and other disorders of the eye. Is the patient up to date with their annual eye exam?  Yes  Who is the provider or what is the name of the office in which the patient attends annual eye exams? DR Dione Booze  If pt is not established with a provider, would they like to be referred to a provider to establish care? No .   Dental Screening: Recommended annual dental exams for proper oral hygiene  Diabetic Foot Exam:   Community Resource Referral / Chronic Care Management: CRR required this visit?  No   CCM required this visit?  No     Plan:     I have personally reviewed and noted the following in the patient's chart:   Medical and social  history Use of alcohol, tobacco or illicit drugs  Current medications and supplements including opioid prescriptions. Patient is not currently taking opioid prescriptions. Functional ability and status Nutritional status Physical activity Advanced directives List of other physicians Hospitalizations, surgeries, and ER visits in previous 12 months Vitals Screenings to include cognitive, depression, and falls Referrals and appointments  In addition, I have reviewed and discussed with patient certain  preventive protocols, quality metrics, and best practice recommendations. A written personalized care plan for preventive services as well as general preventive health recommendations were provided to patient.     Derrell Lolling, CMA   03/11/2023   After Visit Summary: (MyChart) Due to this being a telephonic visit, the after visit summary with patients personalized plan was offered to patient via MyChart   Nurse Notes: IN PERSON [facility]   Ms. Depolo , Thank you for taking time to come for your Medicare Wellness Visit. I appreciate your ongoing commitment to your health goals. Please review the following plan we discussed and let me know if I can assist you in the future.   These are the goals we discussed:  Goals   None     This is a list of the screening recommended for you and due dates:  Health Maintenance  Topic Date Due   Eye exam for diabetics  Never done   Zoster (Shingles) Vaccine (1 of 2) Never done   Pap Smear  Never done   Colon Cancer Screening  Never done   Mammogram  Never done   COVID-19 Vaccine (3 - Pfizer risk series) 12/11/2019   Flu Shot  02/27/2023   Hemoglobin A1C  06/07/2023   Complete foot exam   12/05/2023   Medicare Annual Wellness Visit  03/10/2024   DTaP/Tdap/Td vaccine (2 - Td or Tdap) 05/30/2031   Hepatitis C Screening  Completed   HIV Screening  Completed   HPV Vaccine  Aged Out

## 2023-03-11 NOTE — Patient Instructions (Signed)
Thank you, [name] for allowing [place] to provide your care today. Today we discussed your blood pressure medications, diabetes medications, your fecal incontinence, and your itch/rash.   I have ordered the following labs for you:  Lab Orders         CMP14 + Anion Gap         TSH         POCT HgB A1c and Glucose      Tests ordered today:  None  Referrals ordered today:   Referral Orders         Ambulatory referral to Gastroenterology       I have ordered the following medication/changed the following medications:   Stop the following medications: Medications Discontinued During This Encounter  Medication Reason   losartan (COZAAR) 25 MG tablet Reorder     Start the following medications: Meds ordered this encounter  Medications   losartan (COZAAR) 25 MG tablet    Sig: Take 1 tablet (25 mg total) by mouth daily.    Dispense:  30 tablet    Refill:  11     Follow up:   - We will call you to set up a follow up appointment in 4 weeks  Remember:   - You can buy Sarna lotion for itch:   - You can also buy over the counter Zyrtec and take as directed to help with itch.   - I will call you to go over your lab results. I will go over next steps with diabetes management once that comes back.   - I sent in a refill for your Losartan.    Should you have any questions or concerns please call the internal medicine clinic at [phone].      Colbert Coyer, MD PGY-1 Internal Medicine Teaching Progam [facility], [facility]

## 2023-03-12 NOTE — Assessment & Plan Note (Signed)
Patient continues to go to dialysis M-W-F, not producing own urine. Continue to monitor.

## 2023-03-12 NOTE — Assessment & Plan Note (Signed)
Patient reports she never started the sitagliptin and she cannot afford the insulin. Got Medicaid for the next six months in July and may be able to afford medications moving forward. Doesn't currently use a CGM device but has used on in the past. Endorses nausea, vomiting, weight loss, and intermittent abdominal pain/discomfort. Patient is on dialysis and does not produce urine. Today's A1C is 7.8% compared to 10.4% 12/05/22.   Plan:  - Ok to hold off on insulin and sitagliptin for now. Will have patient see Lupita Leash at follow up visit to discuss diabetes management.

## 2023-03-12 NOTE — Assessment & Plan Note (Signed)
Patient reports recent weight loss (per chart review, patient up 4 pounds from 7/18) and diarrhea for the past year. Has not been seen by gastroenterology or received a colonoscopy in the past. Patient following with oncology, Dr. Myna Hidalgo, for history of small cell lung cancer. Per Dr. Gustavo Lah note, there is concern that her cancer has returned. Patient with bilateral abdominal nodules (potentially swollen lymph nodes) and slightly distended abdomen on exam. Normal respiratory effort and lung sounds today. She has a follow up appointment on 8/15 with him to determine next steps.  Plan:  - TSH lab today, will follow up as needed - Provided DME for fecal incontinence supplies today  - GI referral today

## 2023-03-12 NOTE — Assessment & Plan Note (Addendum)
Patient with new complaint of dry, itchy skin patches on left lower leg and left upper chest. Skin appears to be eczematous on the left leg and extremely dry on upper chest. Encouraged continued use of Vaseline.  Plan:  - Recommended over the counter sarna lotion as well as over the counter zyrtec for itch relief.

## 2023-03-12 NOTE — Assessment & Plan Note (Signed)
Blood pressures today 149/55 and 167/61. Patient taking amlodipine, hydralazine, and coreg daily, but has been out of losartan 25 mg daily for the past few days. CV exam unremarkable, patient denies CP, SOB, palpitations, headache, or vision changes. Will continue to monitor BP and ensure patient is taking all medications before making adjustments.  Plan:  - Refill Losartan 25 mg daily  - Encouraged patient to keep a BP log

## 2023-03-13 ENCOUNTER — Inpatient Hospital Stay: Payer: Medicaid Other

## 2023-03-13 ENCOUNTER — Inpatient Hospital Stay: Payer: Medicaid Other | Admitting: Hematology & Oncology

## 2023-03-13 NOTE — Progress Notes (Signed)
Internal Medicine Clinic Attending  I was physically present during the key portions of the resident provided service and participated in the medical decision making of patient's management care. I reviewed pertinent patient test results.  The assessment, diagnosis, and plan were formulated together and I agree with the documentation in the resident's note.  TSH is suppressed, will need to check T3 and Free T4. Gust Rung, DO

## 2023-03-17 NOTE — Progress Notes (Signed)
[facility] - Clinic Note  03/19/2023     CHIEF COMPLAINT Patient presents for Retina Follow Up   HISTORY OF PRESENT ILLNESS: Amber Cross is a 62 y.o. female who presents to the clinic today for:  HPI     Retina Follow Up   Patient presents with  Diabetic Retinopathy.  In both eyes.  This started 4 weeks ago.  Duration of 4 weeks.  Since onset it is stable.  I, the attending physician,  performed the HPI with the patient and updated documentation appropriately.        Comments   4 week retina follow up and IVA OU pt is reporting no vision changes noticed she has some floaters denies any flashes of light       Last edited by Rennis Chris, MD on 03/19/2023  4:59 PM.    Pt states she called [facility] and they can't get her in for a cataract check until October  Referring physician: Alma Downs, PA-C  [facility] [address] [place],  [place] [address]  HISTORICAL INFORMATION:  Selected notes from the MEDICAL RECORD NUMBER Referred by Alma Downs, PA-C for concern of CME / PDR LEE:  Ocular Hx- PMH-   CURRENT MEDICATIONS: No current outpatient medications on file. (Ophthalmic Drugs)   No current facility-administered medications for this visit. (Ophthalmic Drugs)   Current Outpatient Medications (Other)  Medication Sig   acetaminophen (TYLENOL) 500 MG tablet Take 1,000 mg by mouth every 6 (six) hours as needed for mild pain.   albuterol (VENTOLIN HFA) 108 (90 Base) MCG/ACT inhaler Inhale 2 puffs into the lungs every 6 (six) hours as needed for wheezing or shortness of breath.   amLODipine (NORVASC) 10 MG tablet Take 1 tablet (10 mg total) by mouth daily.   aspirin EC 81 MG tablet Take 1 tablet (81 mg total) by mouth daily. Swallow whole.   atorvastatin (LIPITOR) 80 MG tablet Take 1 tablet (80 mg total) by mouth daily.   B Complex-C-Zn-Folic Acid (DIALYVITE 800 WITH ZINC) 0.8 MG TABS Take 1 tablet by mouth  daily.   Blood Glucose Calibration (MYGLUCOHEALTH CONTROL) SOLN    Calcium Acetate 668 (169 Ca) MG TABS Take 1 tablet by mouth 3 (three) times daily.   carvedilol (COREG) 12.5 MG tablet Take 12.5 mg by mouth.   Continuous Blood Gluc Sensor (DEXCOM G7 SENSOR) MISC Apply each device on your skin every 10 days   Continuous Glucose Receiver (DEXCOM G7 RECEIVER) DEVI    furosemide (LASIX) 40 MG tablet Take 1 tablet (40 mg total) by mouth daily as needed (swelling in legs or weight gain).   gabapentin (NEURONTIN) 600 MG tablet Take 600 mg by mouth daily.   glucose blood (PRECISION QID TEST) test strip    hydrALAZINE (APRESOLINE) 50 MG tablet Take 1 tablet (50 mg total) by mouth every 8 (eight) hours.   Insulin Pen Needle (UNIFINE PENTIPS) 32G X 4 MM MISC Use in the morning and at bedtime   Lancet Devices (ADJUSTABLE LANCING DEVICE) MISC    losartan (COZAAR) 25 MG tablet Take 1 tablet (25 mg total) by mouth daily.   megestrol (MEGACE) 400 MG/10ML suspension Take 10 mLs (400 mg total) by mouth 2 (two) times daily.   sevelamer carbonate (RENVELA) 800 MG tablet Take 800 mg by mouth 3 (three) times daily with meals.   traZODone (DESYREL) 50 MG tablet Take 1 tablet (50 mg total) by mouth at bedtime.  Vitamin D, Ergocalciferol, (DRISDOL) 1.25 MG (50000 UNIT) CAPS capsule Take 1 capsule (50,000 Units total) by mouth every 7 (seven) days.   No current facility-administered medications for this visit. (Other)   REVIEW OF SYSTEMS: ROS   Positive for: Musculoskeletal, Endocrine, Cardiovascular, Eyes Negative for: Constitutional, Gastrointestinal, Neurological, Skin, Genitourinary, HENT, Respiratory, Psychiatric, Allergic/Imm, Heme/Lymph Last edited by Etheleen Mayhew, COT on 03/19/2023  1:10 PM.       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   Postop check 08/05/2022   Pseudogout    Stroke (HCC) 10/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   Tamponade 06/18/2022   Tobacco abuse    Past Surgical History:  Procedure Laterality Date   ANTERIOR CRUCIATE LIGAMENT REPAIR Right    AV FISTULA PLACEMENT Left 07/01/2022   Procedure: LEFT ARTERIOVENOUS (AV) FISTULA CREATION;  Surgeon: Chuck Hint, MD;  Location: [facility] OR;  Service: Vascular;  Laterality: Left;  with regional block   BRONCHIAL BIOPSY  12/18/2021   Procedure: BRONCHIAL BIOPSIES;  Surgeon: Josephine Igo, DO;  Location: [facility] ENDOSCOPY;  Service: Pulmonary;;   BRONCHIAL BRUSHINGS  12/18/2021   Procedure: BRONCHIAL BRUSHINGS;  Surgeon: Josephine Igo, DO;  Location: [facility];  Service: Pulmonary;;   BRONCHIAL NEEDLE ASPIRATION BIOPSY  12/18/2021   Procedure: BRONCHIAL NEEDLE ASPIRATION BIOPSIES;  Surgeon: Josephine Igo, DO;  Location: [facility]COPY;  Service: Pulmonary;;   CATARACT EXTRACTION     CHEST TUBE INSERTION Right 06/18/2022   Procedure: CHEST TUBE INSERTION;  Surgeon: Lovett Sox, MD;  Location: [facility] OR;  Service: Thoracic;  Laterality: Right;   EXCHANGE OF A DIALYSIS CATHETER N/A 08/08/2022   Procedure: EXCHANGE OF A TUNNELED DIALYSIS CATHETER;  Surgeon: Nada Libman, MD;  Location: [facility] OR;  Service: Vascular;  Laterality: N/A;   FIDUCIAL MARKER PLACEMENT  12/18/2021   Procedure: FIDUCIAL MARKER PLACEMENT;  Surgeon: Josephine Igo, DO;  Location: [facility];  Service: Pulmonary;;   IR FLUORO GUIDE CV LINE RIGHT  06/26/2022   IR US GUIDE VASC  ACCESS RIGHT  06/26/2022   OVARY SURGERY     RIGHT OOPHORECTOMY Right    SMALL INTESTINE SURGERY     SUBXYPHOID PERICARDIAL WINDOW N/A 06/18/2022   Procedure: SUBXYPHOID PERICARDIAL WINDOW;  Surgeon: Lovett Sox, MD;  Location: [facility] OR;  Service: Thoracic;  Laterality: N/A;   TEE WITHOUT CARDIOVERSION N/A 06/18/2022   Procedure: TRANSESOPHAGEAL ECHOCARDIOGRAM (TEE);  Surgeon: Lovett Sox, MD;  Location: [facility] OR;  Service: Thoracic;  Laterality: N/A;   TUBAL LIGATION     VIDEO BRONCHOSCOPY WITH RADIAL ENDOBRONCHIAL ULTRASOUND  12/18/2021   Procedure: RADIAL ENDOBRONCHIAL ULTRASOUND;  Surgeon: Josephine Igo, DO;  Location: [facility];  Service: Pulmonary;;   FAMILY HISTORY Family History  Problem Relation Age of Onset   Diabetes Mother    Hypertension Mother  Hypertension Father    SOCIAL HISTORY Social History   Tobacco Use   Smoking status: Every Day    Current packs/day: 0.50    Average packs/day: 0.5 packs/day for 40.0 years (20.0 ttl pk-yrs)    Types: Cigarettes   Smokeless tobacco: Never   Tobacco comments:    Cutting back 1/4-1/2 ppd  Vaping Use   Vaping status: Never Used  Substance Use Topics   Alcohol use: Not Currently   Drug use: Yes    Frequency: 4.0 times per week    Types: Marijuana    Comment: smokes every other day      OPHTHALMIC EXAM:  Base Eye Exam     Visual Acuity (Snellen - Linear)       Right Left   Dist cc 20/25 -1 20/100 -1   Dist ph cc NI NI    Correction: Glasses         Tonometry (Tonopen, 1:15 PM)       Right Left   Pressure 13 16         Pupils       Pupils Dark Light Shape React APD   Right PERRL 3 2 Round Brisk None   Left PERRL 3 2 Round Brisk None         Visual Fields       Left Right    Full Full         Extraocular Movement       Right Left    Full, Ortho Full, Ortho         Neuro/Psych     Oriented x3: Yes   Mood/Affect: Normal         Dilation     Both eyes: 2.5%  Phenylephrine @ 1:15 PM           Slit Lamp and Fundus Exam     Slit Lamp Exam       Right Left   Lids/Lashes Dermatochalasis - upper lid, mild MGD Dermatochalasis - upper lid, mild MGD   Conjunctiva/Sclera nasal pingeucula, Melanosis nasal pingeucula, Melanosis   Cornea arcus, well healed cataract wound, nasal LRI, 2+ Punctate epithelial erosions arcus, 1+ Punctate epithelial erosions, trace tear film debris   Anterior Chamber deep and clear deep and clear   Iris Round and dilated Round and dilated   Lens PC IOL in good position 2+ Nuclear sclerosis with brunescence, 2-3+ Cortical cataract   Anterior Vitreous mild syneresis, Posterior vitreous detachment mild syneresis         Fundus Exam       Right Left   Disc Pink and Sharp, temporal pallor, temporal PPP Pink and Sharp, temporal pallor, temporal PPP, no NVD   C/D Ratio 0.3 0.3   Macula Good foveal reflex, scattered MA, DBH and punctate exudates -- improved, cystic changes -- persistent Blunted foveal reflex, dense central cluster of exudates -- slightly improved, scattered MA, DBH, central edema -- slightly improved   Vessels attenuated, Tortuous, no NV attenuated, Tortuous, no NV   Periphery Attached, scattered MA Attached, scattered MA           Refraction     Wearing Rx       Sphere Cylinder Axis   Right -2.50 +0.50 158   Left -3.50 +0.75 151           IMAGING AND PROCEDURES  Imaging and Procedures for 03/19/2023  OCT, Retina - OU - Both Eyes       Right  Eye Quality was good. Central Foveal Thickness: 238. Progression has worsened. Findings include normal foveal contour, no SRF, intraretinal hyper-reflective material, intraretinal fluid (persistent IRF / IRHM greatest ST mac -- slightly increased).   Left Eye Quality was good. Central Foveal Thickness: 207. Progression has improved. Findings include no SRF, abnormal foveal contour, subretinal hyper-reflective material, intraretinal hyper-reflective  material, intraretinal fluid, outer retinal atrophy (Persistent IRF/IRHM -- slightly improved).   Notes *Images captured and stored on drive  Diagnosis / Impression:  DME OU OD: persistent IRF / IRHM greatest ST mac -- slightly increased OS: Persistent IRF/IRHM -- slightly improved  Clinical management:  See below  Abbreviations: NFP - Normal foveal profile. CME - cystoid macular edema. PED - pigment epithelial detachment. IRF - intraretinal fluid. SRF - subretinal fluid. EZ - ellipsoid zone. ERM - epiretinal membrane. ORA - outer retinal atrophy. ORT - outer retinal tubulation. SRHM - subretinal hyper-reflective material. IRHM - intraretinal hyper-reflective material      Intravitreal Injection, Pharmacologic Agent - OD - Right Eye       Time Out 03/19/2023. 1:56 PM. Confirmed correct patient, procedure, site, and patient consented.   Anesthesia Topical anesthesia was used. Anesthetic medications included Lidocaine 2%, Proparacaine 0.5%.   Procedure Preparation included 5% betadine to ocular surface, eyelid speculum. A supplied needle was used.   Injection: 1.25 mg Bevacizumab 1.25mg /0.23ml   Route: Intravitreal, Site: Right Eye   NDC: P3213405, Lot: [id number], Expiration date: 04/19/2023   Post-op Post injection exam found visual acuity of at least counting fingers. The patient tolerated the procedure well. There were no complications. The patient received written and verbal post procedure care education.      Intravitreal Injection, Pharmacologic Agent - OS - Left Eye       Time Out 03/19/2023. 1:57 PM. Confirmed correct patient, procedure, site, and patient consented.   Anesthesia Topical anesthesia was used. Anesthetic medications included Lidocaine 2%, Proparacaine 0.5%.   Procedure Preparation included 5% betadine to ocular surface, eyelid speculum. A supplied (32g) needle was used.   Injection: 1.25 mg Bevacizumab 1.25mg /0.42ml   Route: Intravitreal, Site:  Left Eye   NDC: 36644-034-74, Lot: [id number] , Expiration date: 04/12/2023   Post-op Post injection exam found visual acuity of at least counting fingers. The patient tolerated the procedure well. There were no complications. The patient received written and verbal post procedure care education.           ASSESSMENT/PLAN:  1,2 Severe Non-proliferative diabetic retinopathy, both eyes - s/p IVA OD #1 (05.02.24), #2 (05.30.24), #3 (07.24.24)  - s/p IVA OS #1 (04.04.24), #2 (05.02.24), #3 (05.30.24), #4 (07.24.24)  - last A1c was 10.4 on 12/05/22, 7.4 on 3.15.24 - BCVA 20/25 OD, 20/100 OS - exam shows scattered MA/DBH and exudates OU (OS > OD) - FA (04.04.24) shows leaking MA greatest posteriorly, no NV OU - OCT shows OD: persistent IRF / IRHM greatest ST mac -- slightly increased; OS: Persistent IRF/IRHM -- slightly improved **discussed decreased efficacy / resistance to Avastin and potential benefit of switching medication** - recommend IVA OU (OD #4 and OS #5) today, 08.21.24 for DME - pt wishes to proceed - RBA of procedure discussed, questions answered - IVA informed consent obtained and signed, 04.04.24 (OU) - see procedure note - will check [employer] auth for next visit - f/u in 4 wks -- DFE/OCT, possible injection  3,4. Hypertensive retinopathy OU - discussed importance of tight BP control - monitor  5. Mixed Cataract OS - The symptoms of cataract,  surgical options, and treatments and risks were discussed with patient. - discussed diagnosis and progression - under the expert management of [facility] - pt wishes to have cataract surgery this year in 2024 - clear from a retina standpoint to proceed with cataract surgery when pt and surgeon are ready  - will refer back to [facility] for cataract f/u  6. Pseudophakia OD  - s/p CE/IOL OD (Dr. Dione Booze, 2017)  - IOL in good position, doing well  - monitor  Ophthalmic Meds Ordered this visit:  Meds ordered this encounter   Medications   Bevacizumab (AVASTIN) SOLN 1.25 mg   Bevacizumab (AVASTIN) SOLN 1.25 mg    Return in about 4 weeks (around 04/16/2023) for f/u NPDR OU, DFE, OCT.  There are no Patient Instructions on file for this visit.  Explained the diagnoses, plan, and follow up with the patient and they expressed understanding.  Patient expressed understanding of the importance of proper follow up care.   This document serves as a record of services personally performed by Karie Chimera, MD, PhD. It was created on their behalf by De Blanch, an ophthalmic technician. The creation of this record is the provider's dictation and/or activities during the visit.    Electronically signed by: De Blanch, OA, 03/20/23  12:20 PM  This document serves as a record of services personally performed by Karie Chimera, MD, PhD. It was created on their behalf by Glee Arvin. Manson Passey, OA an ophthalmic technician. The creation of this record is the provider's dictation and/or activities during the visit.    Electronically signed by: Glee Arvin. Manson Passey, OA 03/20/23 12:20 PM  Karie Chimera, M.D., Ph.D. Diseases & Surgery of the Retina and Vitreous [facility] 03/19/2023  I have reviewed the above documentation for accuracy and completeness, and I agree with the above. Karie Chimera, M.D., Ph.D. 03/20/23 12:20 PM   Abbreviations: M myopia (nearsighted); A astigmatism; H hyperopia (farsighted); P presbyopia; Mrx spectacle prescription;  CTL contact lenses; OD right eye; OS left eye; OU both eyes  XT exotropia; ET esotropia; PEK punctate epithelial keratitis; PEE punctate epithelial erosions; DES dry eye syndrome; MGD meibomian gland dysfunction; ATs artificial tears; PFAT's preservative free artificial tears; NSC nuclear sclerotic cataract; PSC posterior subcapsular cataract; ERM epi-retinal membrane; PVD posterior vitreous detachment; RD retinal detachment; DM diabetes mellitus; DR diabetic  retinopathy; NPDR non-proliferative diabetic retinopathy; PDR proliferative diabetic retinopathy; CSME clinically significant macular edema; DME diabetic macular edema; dbh dot blot hemorrhages; CWS cotton wool spot; POAG primary open angle glaucoma; C/D cup-to-disc ratio; HVF humphrey visual field; GVF goldmann visual field; OCT optical coherence tomography; IOP intraocular pressure; BRVO Branch retinal vein occlusion; CRVO central retinal vein occlusion; CRAO central retinal artery occlusion; BRAO branch retinal artery occlusion; RT retinal tear; SB scleral buckle; PPV pars plana vitrectomy; VH Vitreous hemorrhage; PRP panretinal laser photocoagulation; IVK intravitreal kenalog; VMT vitreomacular traction; MH Macular hole;  NVD neovascularization of the disc; NVE neovascularization elsewhere; AREDS age related eye disease study; ARMD age related macular degeneration; POAG primary open angle glaucoma; EBMD epithelial/anterior basement membrane dystrophy; ACIOL anterior chamber intraocular lens; IOL intraocular lens; PCIOL posterior chamber intraocular lens; Phaco/IOL phacoemulsification with intraocular lens placement; PRK photorefractive keratectomy; LASIK laser assisted in situ keratomileusis; HTN hypertension; DM diabetes mellitus; COPD chronic obstructive pulmonary disease

## 2023-03-18 ENCOUNTER — Other Ambulatory Visit: Payer: Self-pay | Admitting: Student

## 2023-03-19 ENCOUNTER — Ambulatory Visit (INDEPENDENT_AMBULATORY_CARE_PROVIDER_SITE_OTHER): Payer: 59 | Admitting: Ophthalmology

## 2023-03-19 ENCOUNTER — Encounter (INDEPENDENT_AMBULATORY_CARE_PROVIDER_SITE_OTHER): Payer: Self-pay | Admitting: Ophthalmology

## 2023-03-19 ENCOUNTER — Other Ambulatory Visit: Payer: Self-pay | Admitting: Student

## 2023-03-19 DIAGNOSIS — R7989 Other specified abnormal findings of blood chemistry: Secondary | ICD-10-CM

## 2023-03-19 DIAGNOSIS — I10 Essential (primary) hypertension: Secondary | ICD-10-CM

## 2023-03-19 DIAGNOSIS — H35033 Hypertensive retinopathy, bilateral: Secondary | ICD-10-CM

## 2023-03-19 DIAGNOSIS — H25812 Combined forms of age-related cataract, left eye: Secondary | ICD-10-CM

## 2023-03-19 DIAGNOSIS — Z794 Long term (current) use of insulin: Secondary | ICD-10-CM | POA: Diagnosis not present

## 2023-03-19 DIAGNOSIS — E113413 Type 2 diabetes mellitus with severe nonproliferative diabetic retinopathy with macular edema, bilateral: Secondary | ICD-10-CM | POA: Diagnosis not present

## 2023-03-19 DIAGNOSIS — Z961 Presence of intraocular lens: Secondary | ICD-10-CM

## 2023-03-19 MED ORDER — BEVACIZUMAB CHEMO INJECTION 1.25MG/0.05ML SYRINGE FOR KALEIDOSCOPE
1.2500 mg | INTRAVITREAL | Status: AC | PRN
Start: 2023-03-19 — End: 2023-03-19
  Administered 2023-03-19: 1.25 mg via INTRAVITREAL

## 2023-03-19 NOTE — Progress Notes (Signed)
Called patient to discuss lab results. Patient identity confirmed with DOB. A1C improved, Cr level improved, ALT slightly elevated, calcium minimally low. Will continue to monitor, nothing to do. Discussed low TSH level and need for T3 (total) and Free T4 labs at follow up visit, which was scheduled today for 04/08/23. Patient expressed understanding.

## 2023-04-01 NOTE — Progress Notes (Signed)
Internal Medicine Clinic Attending  I reviewed the AWV findings.  I agree with the assessment, diagnosis, and plan of care documented in the AWV note.     

## 2023-04-08 ENCOUNTER — Ambulatory Visit (INDEPENDENT_AMBULATORY_CARE_PROVIDER_SITE_OTHER): Payer: 59 | Admitting: Student

## 2023-04-08 ENCOUNTER — Encounter: Payer: Self-pay | Admitting: Student

## 2023-04-08 VITALS — BP 172/84 | HR 79 | Temp 97.9°F | Wt 117.8 lb

## 2023-04-08 DIAGNOSIS — Z794 Long term (current) use of insulin: Secondary | ICD-10-CM

## 2023-04-08 DIAGNOSIS — E1142 Type 2 diabetes mellitus with diabetic polyneuropathy: Secondary | ICD-10-CM

## 2023-04-08 DIAGNOSIS — C3411 Malignant neoplasm of upper lobe, right bronchus or lung: Secondary | ICD-10-CM

## 2023-04-08 DIAGNOSIS — E1122 Type 2 diabetes mellitus with diabetic chronic kidney disease: Secondary | ICD-10-CM

## 2023-04-08 DIAGNOSIS — Z741 Need for assistance with personal care: Secondary | ICD-10-CM | POA: Insufficient documentation

## 2023-04-08 DIAGNOSIS — R7989 Other specified abnormal findings of blood chemistry: Secondary | ICD-10-CM

## 2023-04-08 DIAGNOSIS — I10 Essential (primary) hypertension: Secondary | ICD-10-CM | POA: Diagnosis not present

## 2023-04-08 DIAGNOSIS — E041 Nontoxic single thyroid nodule: Secondary | ICD-10-CM

## 2023-04-08 DIAGNOSIS — Z23 Encounter for immunization: Secondary | ICD-10-CM | POA: Diagnosis not present

## 2023-04-08 LAB — GLUCOSE, CAPILLARY: Glucose-Capillary: 105 mg/dL — ABNORMAL HIGH (ref 70–99)

## 2023-04-08 MED ORDER — DEXCOM G7 SENSOR MISC
2 refills | Status: DC
  Filled 2023-04-08: qty 3, 30d supply, fill #0

## 2023-04-08 MED ORDER — DEXCOM G7 RECEIVER DEVI
1.0000 [IU] | 7 refills | Status: AC
Start: 2023-04-08 — End: 2023-07-07
  Filled 2023-04-08: qty 1, 30d supply, fill #0

## 2023-04-08 NOTE — Assessment & Plan Note (Signed)
Patient has a past medical history of HTN. Patient reports that she was running late for the appointment today and she was unable to take her medications today. Her blood pressures were very elevated today and this was likely due to the fact that she did not take her medications today. Encouraged patient to take her medications before appointment so we can titrate her medications accurately.  Patient agreed to this plan.  Plan: -Continue losartan 25 mg daily -Continue hydralazine 50 mg every 8 hours -Continue amlodipine 10 mg daily -Continue carvedilol 12.5 mg twice daily -Follow-up in 1 month for blood pressure recheck

## 2023-04-08 NOTE — Progress Notes (Signed)
CC: Requesting personal care services   HPI:  [name] is a 62 y.o. female with a past medical history of hypertension, HFrEF, CKD stage V who presents for follow-up appointment.  Please see assessment and plan for full HPI.  Medications: Vitamin D deficiency: Vitamin D 50,000 units weekly Insomnia: Trazodone 50 mg nightly ESRD: Renvela 800 mg 3 times daily Hypertension: Losartan 25 mg daily, hydralazine 50 mg every 8 hours, amlodipine 10 mg daily Diabetic neuropathy: Gabapentin 600 mg daily HFrEF: Lasix 40 mg daily as needed, carvedilol 12.5 mg twice daily, Hyperlipidemia: Lipitor 80 mg daily  Past Medical History:  Diagnosis Date   Anemia    Arthritis    Chest pain 05/07/2021   CHF (congestive heart failure) (HCC)    Chronic kidney disease    dialysis M-W-F   Depression    Diabetes mellitus    Headache    Hypercholesteremia    Hypertension    Hypertensive emergency 06/16/2022   Lung cancer (HCC) 12/18/2021   RUL small cell carcinoma, s/p radiation   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   Postop check 08/05/2022   Pseudogout    Stroke (HCC) 10/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   Tamponade 06/18/2022   Tobacco abuse      Current Outpatient Medications:    acetaminophen (TYLENOL) 500 MG tablet, Take 1,000 mg by mouth every 6 (six) hours as needed for mild pain., Disp: , Rfl:    albuterol (VENTOLIN HFA) 108 (90 Base) MCG/ACT inhaler, Inhale 2 puffs into the lungs every 6 (six) hours as needed for wheezing or shortness of breath., Disp: 6.7 g, Rfl: 2   amLODipine (NORVASC) 10 MG tablet, Take 1 tablet (10 mg total) by mouth daily., Disp: 90 tablet, Rfl: 3   aspirin EC 81 MG tablet, Take 1 tablet (81 mg total) by mouth daily. Swallow whole., Disp: 30 tablet, Rfl: 11   atorvastatin (LIPITOR) 80 MG tablet, Take 1 tablet (80 mg total) by mouth daily., Disp: 90 tablet, Rfl: 3   B Complex-C-Zn-Folic Acid (DIALYVITE 800 WITH ZINC) 0.8 MG TABS, Take 1 tablet by mouth daily.,  Disp: , Rfl:    Blood Glucose Calibration (MYGLUCOHEALTH CONTROL) SOLN, , Disp: , Rfl:    Calcium Acetate 668 (169 Ca) MG TABS, Take 1 tablet by mouth 3 (three) times daily., Disp: , Rfl:    carvedilol (COREG) 12.5 MG tablet, Take 12.5 mg by mouth., Disp: , Rfl:    Continuous Glucose Receiver (DEXCOM G7 RECEIVER) DEVI, Inject 1 Units into the skin every 14 (fourteen) days., Disp: 1 each, Rfl: 7   Continuous Glucose Sensor (DEXCOM G7 SENSOR) MISC, Apply each device on your skin every 10 days, Disp: 3 each, Rfl: 2   furosemide (LASIX) 40 MG tablet, Take 1 tablet (40 mg total) by mouth daily as needed (swelling in legs or weight gain)., Disp: 30 tablet, Rfl: 1   gabapentin (NEURONTIN) 600 MG tablet, Take 600 mg by mouth daily., Disp: , Rfl:    glucose blood (PRECISION QID TEST) test strip, , Disp: , Rfl:    hydrALAZINE (APRESOLINE) 50 MG tablet, Take 1 tablet (50 mg total) by mouth every 8 (eight) hours., Disp: 180 tablet, Rfl: 3   Insulin Pen Needle (UNIFINE PENTIPS) 32G X 4 MM MISC, Use in the morning and at bedtime, Disp: 100 each, Rfl: 3   Lancet Devices (ADJUSTABLE LANCING DEVICE) MISC, , Disp: , Rfl:    losartan (COZAAR) 25 MG tablet, Take 1 tablet (25 mg  total) by mouth daily., Disp: 30 tablet, Rfl: 11   megestrol (MEGACE) 400 MG/10ML suspension, Take 10 mLs (400 mg total) by mouth 2 (two) times daily., Disp: 480 mL, Rfl: 3   sevelamer carbonate (RENVELA) 800 MG tablet, Take 800 mg by mouth 3 (three) times daily with meals., Disp: , Rfl:    traZODone (DESYREL) 50 MG tablet, Take 1 tablet (50 mg total) by mouth at bedtime., Disp: 90 tablet, Rfl: 1   Vitamin D, Ergocalciferol, (DRISDOL) 1.25 MG (50000 UNIT) CAPS capsule, Take 1 capsule (50,000 Units total) by mouth every 7 (seven) days., Disp: 5 capsule, Rfl: 0  Review of Systems:   Negative except what is stated in HPI   Physical Exam:  Vitals:   04/08/23 1009 04/08/23 1058  BP: (!) 174/80 (!) 172/84  Pulse: 85 79  Temp: 97.9 F (36.6  C)   TempSrc: Oral   SpO2: 100%   Weight: 117 lb 12.8 oz (53.4 kg)    General: Patient is sitting comfortably in the room  Eyes: No proptosis appreciated   Head: Normocephalic, atraumatic  Neck: Slightly enlarged thyroid appreciated  Cardio: Regular rate and rhythm, no murmurs, rubs or gallops Pulmonary: Clear to ausculation bilaterally with no rales, rhonchi, and crackles    Assessment & Plan:   Essential hypertension Patient has a past medical history of HTN. Patient reports that she was running late for the appointment today and she was unable to take her medications today. Her blood pressures were very elevated today and this was likely due to the fact that she did not take her medications today. Encouraged patient to take her medications before appointment so we can titrate her medications accurately.  Patient agreed to this plan.  Plan: -Continue losartan 25 mg daily -Continue hydralazine 50 mg every 8 hours -Continue amlodipine 10 mg daily -Continue carvedilol 12.5 mg twice daily -Follow-up in 1 month for blood pressure recheck  Small cell lung cancer, right upper lobe (HCC) Patient has a past medical history of small cell lung cancer.  She is followed by Dr. Myna Hidalgo with oncology who recently ordered a thyroid ultrasound as well as CT chest.  There is some concern that patient has progressive metastases.  I encourage patient to follow-up with her oncologist as patient did miss some of her appointments.  Did give patient information to call the office to make an appointment.  Plan: -Gave patient contact information to Dr. Myna Hidalgo office so she can schedule appointment  Type 2 diabetes mellitus with diabetic polyneuropathy [facility]) Patient has a past medical history of type 2 diabetes mellitus.  Patient states she cannot afford her medicines at this time.  A1c about 1 month ago was 7.8%.  Patient does have ESRD, so A1c likely is not accurate.  Will start patient with Dexcom today.   Will have her follow-up in the next month and we can evaluate Dexcom to see if patient could benefit from insulin therapy.  Plan: -Dexcom provided today -Refer patient to nutrition and diabetes specialist -Follow-up in 1 month  Personal care impairment Patient has been taking care of by her son for a while, and he recently started driving trucks, so is not as home as he used to be.  She states she is having trouble with cooking, cleaning, bathing herself.  She is requesting personal care services.  Plan: -Request personal care services for patient  Thyroid nodule Patient has a large 2.4 cm nodule noted on her thyroid.  This was seen on  ultrasound.  Patient recently had TSH that was decreased.  Could be metabolically active.  Given size, and meets the criteria for biopsy.  Will refer to IR for biopsy.  Plan: -Referred patient for IR -Follow-up T3 and free T4 studies  Flu vaccine need Administered flu vaccine today   Patient discussed with Dr. Princella Pellegrini, DO PGY-2 Internal Medicine Resident  Pager: [phone]

## 2023-04-08 NOTE — Assessment & Plan Note (Signed)
Administered flu vaccine today.

## 2023-04-08 NOTE — Patient Instructions (Addendum)
Amber Cross, [place] you for allowing me to take part in your care today.  Here are your instructions.  1.  Regarding your diabetes, please check your sugars.  We can also follow-up in 1 month and discuss your sugars.  If need be we can start you on insulin.  If you develop frequent urination, lots of thirst please let us know.  2.  Please call your oncologist Dr. Myna Hidalgo.  His phone number is [phone].  This is the most important.  3.  I have referred you to the interventional radiologist to do a thyroid biopsy.  Also will be taking blood today to follow-up on your thyroid.  I will call you with the results.  Thank you, Dr. Allena Katz  If you have any other questions please contact the internal medicine clinic at [phone] If it is after hours, please call the [facility] at [phone] and then ask the person who picks up for the resident on call.

## 2023-04-08 NOTE — Assessment & Plan Note (Signed)
Patient has been taking care of by her son for a while, and he recently started driving trucks, so is not as home as he used to be.  She states she is having trouble with cooking, cleaning, bathing herself.  She is requesting personal care services.  Plan: -Request personal care services for patient

## 2023-04-08 NOTE — Assessment & Plan Note (Signed)
Patient has a past medical history of type 2 diabetes mellitus.  Patient states she cannot afford her medicines at this time.  A1c about 1 month ago was 7.8%.  Patient does have ESRD, so A1c likely is not accurate.  Will start patient with Dexcom today.  Will have her follow-up in the next month and we can evaluate Dexcom to see if patient could benefit from insulin therapy.  Plan: -Dexcom provided today -Refer patient to nutrition and diabetes specialist -Follow-up in 1 month

## 2023-04-08 NOTE — Assessment & Plan Note (Signed)
Patient has a past medical history of small cell lung cancer.  She is followed by Dr. Myna Hidalgo with oncology who recently ordered a thyroid ultrasound as well as CT chest.  There is some concern that patient has progressive metastases.  I encourage patient to follow-up with her oncologist as patient did miss some of her appointments.  Did give patient information to call the office to make an appointment.  Plan: -Gave patient contact information to Dr. Myna Hidalgo office so she can schedule appointment

## 2023-04-08 NOTE — Assessment & Plan Note (Signed)
Patient has a large 2.4 cm nodule noted on her thyroid.  This was seen on ultrasound.  Patient recently had TSH that was decreased.  Could be metabolically active.  Given size, and meets the criteria for biopsy.  Will refer to IR for biopsy.  Plan: -Referred patient for IR -Follow-up T3 and free T4 studies

## 2023-04-09 ENCOUNTER — Other Ambulatory Visit (HOSPITAL_COMMUNITY): Payer: Self-pay

## 2023-04-09 ENCOUNTER — Other Ambulatory Visit: Payer: Self-pay

## 2023-04-09 NOTE — Progress Notes (Signed)
Internal Medicine Clinic Attending  Case discussed with the resident at the time of the visit.  We reviewed the resident's history and exam and pertinent patient test results.  I agree with the assessment, diagnosis, and plan of care documented in the resident's note.  

## 2023-04-14 ENCOUNTER — Telehealth: Payer: Self-pay | Admitting: *Deleted

## 2023-04-14 NOTE — Telephone Encounter (Signed)
Spoke with patient regarding her request for PCS/ forms faxed to [facility] / [employer] for review-scheduling-phone [phone] /fax- [phone]. Patient ware she will contacted to set up assessment appointment -to give office about 2-3 weeks for contacting.

## 2023-04-16 ENCOUNTER — Ambulatory Visit: Payer: Self-pay | Admitting: Hematology & Oncology

## 2023-04-16 ENCOUNTER — Encounter (INDEPENDENT_AMBULATORY_CARE_PROVIDER_SITE_OTHER): Payer: 59 | Admitting: Ophthalmology

## 2023-04-16 ENCOUNTER — Telehealth: Payer: Self-pay | Admitting: *Deleted

## 2023-04-16 ENCOUNTER — Encounter (INDEPENDENT_AMBULATORY_CARE_PROVIDER_SITE_OTHER): Payer: Self-pay

## 2023-04-16 ENCOUNTER — Other Ambulatory Visit: Payer: Self-pay

## 2023-04-16 DIAGNOSIS — I10 Essential (primary) hypertension: Secondary | ICD-10-CM

## 2023-04-16 DIAGNOSIS — H35033 Hypertensive retinopathy, bilateral: Secondary | ICD-10-CM

## 2023-04-16 DIAGNOSIS — H25812 Combined forms of age-related cataract, left eye: Secondary | ICD-10-CM

## 2023-04-16 DIAGNOSIS — E113413 Type 2 diabetes mellitus with severe nonproliferative diabetic retinopathy with macular edema, bilateral: Secondary | ICD-10-CM

## 2023-04-16 DIAGNOSIS — Z794 Long term (current) use of insulin: Secondary | ICD-10-CM

## 2023-04-16 DIAGNOSIS — Z961 Presence of intraocular lens: Secondary | ICD-10-CM

## 2023-04-16 NOTE — Telephone Encounter (Signed)
Returned call to [facility] [phone]  (ext [phone]) / LVM, regarding Amber Cross.

## 2023-04-18 LAB — T3: T3, Total: 80 ng/dL (ref 71–180)

## 2023-04-18 LAB — T4+FREE T4
Free T4 by Dialysis: 1.4 ng/dL
Thyroxine (T4): 9.2 ug/dL

## 2023-04-21 ENCOUNTER — Other Ambulatory Visit (HOSPITAL_COMMUNITY): Payer: Self-pay

## 2023-04-25 ENCOUNTER — Other Ambulatory Visit: Payer: Self-pay

## 2023-04-25 ENCOUNTER — Inpatient Hospital Stay: Payer: 59 | Attending: Radiation Oncology

## 2023-04-25 ENCOUNTER — Encounter: Payer: Self-pay | Admitting: Hematology & Oncology

## 2023-04-25 ENCOUNTER — Inpatient Hospital Stay (HOSPITAL_BASED_OUTPATIENT_CLINIC_OR_DEPARTMENT_OTHER): Payer: 59 | Admitting: Hematology & Oncology

## 2023-04-25 VITALS — BP 128/52 | HR 80 | Temp 98.4°F | Resp 18 | Ht 67.0 in | Wt 110.1 lb

## 2023-04-25 DIAGNOSIS — C3411 Malignant neoplasm of upper lobe, right bronchus or lung: Secondary | ICD-10-CM | POA: Diagnosis not present

## 2023-04-25 DIAGNOSIS — R634 Abnormal weight loss: Secondary | ICD-10-CM | POA: Insufficient documentation

## 2023-04-25 DIAGNOSIS — C3491 Malignant neoplasm of unspecified part of right bronchus or lung: Secondary | ICD-10-CM | POA: Diagnosis present

## 2023-04-25 DIAGNOSIS — R222 Localized swelling, mass and lump, trunk: Secondary | ICD-10-CM | POA: Insufficient documentation

## 2023-04-25 DIAGNOSIS — Z992 Dependence on renal dialysis: Secondary | ICD-10-CM | POA: Insufficient documentation

## 2023-04-25 DIAGNOSIS — F17210 Nicotine dependence, cigarettes, uncomplicated: Secondary | ICD-10-CM | POA: Diagnosis not present

## 2023-04-25 DIAGNOSIS — Z794 Long term (current) use of insulin: Secondary | ICD-10-CM | POA: Diagnosis not present

## 2023-04-25 DIAGNOSIS — E119 Type 2 diabetes mellitus without complications: Secondary | ICD-10-CM | POA: Diagnosis not present

## 2023-04-25 LAB — CMP (CANCER CENTER ONLY)
ALT: 11 U/L (ref 0–44)
AST: 13 U/L — ABNORMAL LOW (ref 15–41)
Albumin: 3 g/dL — ABNORMAL LOW (ref 3.5–5.0)
Alkaline Phosphatase: 73 U/L (ref 38–126)
Anion gap: 13 (ref 5–15)
BUN: 13 mg/dL (ref 8–23)
CO2: 32 mmol/L (ref 22–32)
Calcium: 8.9 mg/dL (ref 8.9–10.3)
Chloride: 94 mmol/L — ABNORMAL LOW (ref 98–111)
Creatinine: 3.12 mg/dL — ABNORMAL HIGH (ref 0.44–1.00)
GFR, Estimated: 16 mL/min — ABNORMAL LOW (ref >60)
Glucose, Bld: 283 mg/dL — ABNORMAL HIGH (ref 70–99)
Potassium: 3.7 mmol/L (ref 3.5–5.1)
Sodium: 139 mmol/L (ref 135–145)
Total Bilirubin: 0.2 mg/dL — ABNORMAL LOW (ref 0.3–1.2)
Total Protein: 7 g/dL (ref 6.5–8.1)

## 2023-04-25 LAB — CBC WITH DIFFERENTIAL (CANCER CENTER ONLY)
Abs Immature Granulocytes: 0.02 10*3/uL (ref 0.00–0.07)
Basophils Absolute: 0 10*3/uL (ref 0.0–0.1)
Basophils Relative: 1 %
Eosinophils Absolute: 0 10*3/uL (ref 0.0–0.5)
Eosinophils Relative: 0 %
HCT: 33.7 % — ABNORMAL LOW (ref 36.0–46.0)
Hemoglobin: 10.6 g/dL — ABNORMAL LOW (ref 12.0–15.0)
Immature Granulocytes: 0 %
Lymphocytes Relative: 14 %
Lymphs Abs: 1 10*3/uL (ref 0.7–4.0)
MCH: 29.3 pg (ref 26.0–34.0)
MCHC: 31.5 g/dL (ref 30.0–36.0)
MCV: 93.1 fL (ref 80.0–100.0)
Monocytes Absolute: 0.6 10*3/uL (ref 0.1–1.0)
Monocytes Relative: 8 %
Neutro Abs: 5.4 10*3/uL (ref 1.7–7.7)
Neutrophils Relative %: 77 %
Platelet Count: 288 10*3/uL (ref 150–400)
RBC: 3.62 MIL/uL — ABNORMAL LOW (ref 3.87–5.11)
RDW: 15.2 % (ref 11.5–15.5)
WBC Count: 7 10*3/uL (ref 4.0–10.5)
nRBC: 0 % (ref 0.0–0.2)

## 2023-04-25 LAB — LACTATE DEHYDROGENASE: LDH: 218 U/L — ABNORMAL HIGH (ref 98–192)

## 2023-04-25 MED ORDER — MEGESTROL ACETATE 625 MG/5ML PO SUSP
625.0000 mg | Freq: Every day | ORAL | 4 refills | Status: DC
Start: 2023-04-25 — End: 2024-03-04

## 2023-04-25 NOTE — Progress Notes (Signed)
Hematology and Oncology Follow Up Visit  Amber Cross [id number] 07/16/1961 62 y.o. 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  Unfortunately, it has been quite a while since we last saw Amber Cross.  I am still not sure as to why it is taking so long for Amber Cross to come back to see [place].  Regardless, Amber Cross clearly has progressive disease in my opinion.  We did Amber Cross last PET scan, it clearly showed that Amber Cross had disease recurrence.  Amber Cross had a PET scan back in June.  Amber Cross had disease in the mediastinal right hilar lymph nodes.  Amber Cross had a right supraclavicular lymph node.  Now, Amber Cross has a subcutaneous nodules.  They appear to be on the upper abdomen.  Amber Cross is lost weight.  Today, Amber Cross weight is 110 pounds.  When we last saw Amber Cross, Amber Cross weight was 111 pounds.  Amber Cross is not taking the Megace elixir.  Amber Cross says that taste terrible.  I will try Amber Cross on the Megace ES elixir.  Maybe this will taste better for Amber Cross.  Amber Cross gets dialysis.  We will try to get Amber Cross chemotherapy schedule so that Amber Cross will have a long day of chemotherapy on Tuesday.  We probably had to make a dosage adjustment to Amber Cross chemotherapy.  Of note, Amber Cross never has had chemotherapy.  I think that Amber Cross would do fairly well with the carboplatinum/etoposide/Tecentriq.  Again, we will have to make some doses adjustments.  Do think Amber Cross findings have another PET scan.  We will have to get the PET scan at [facility].  Amber Cross still has pretty decent veins.  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  As such, we will have to use the right arm.  I do not think we have to do a Port-A-Cath right now.  Amber Cross understands that we are dealing with it is treatable but not curable.  Amber Cross just wants to feel better.   Maybe, if we can get Amber Cross tumor shrinking, Amber Cross will be a little bit better.  Amber Cross is still smoking.  Amber Cross probably smokes about half pack a day cigarettes.  Overall, I would say that Amber Cross performance status is probably ECOG 2.   Medications:  Current Outpatient Medications:    acetaminophen (TYLENOL) 500 MG tablet, Take 1,000 mg by mouth every 6 (six) hours as needed for mild pain., Disp: , Rfl:    albuterol (VENTOLIN HFA) 108 (90 Base) MCG/ACT inhaler, Inhale 2 puffs into the lungs every 6 (six) hours as needed for wheezing or shortness of breath., Disp: 6.7 g, Rfl: 2   amLODipine (NORVASC) 10 MG tablet, Take 1 tablet (10 mg total) by mouth daily., Disp: 90 tablet, Rfl: 3   aspirin EC 81 MG tablet, Take 1 tablet (81 mg total) by mouth daily. Swallow whole., Disp: 30 tablet, Rfl: 11   atorvastatin (LIPITOR) 80 MG tablet, Take 1 tablet (80 mg total) by mouth daily., Disp: 90 tablet, Rfl: 3   B Complex-C-Zn-Folic Acid (DIALYVITE 800 WITH ZINC) 0.8 MG TABS, Take 1 tablet by mouth daily., Disp: , Rfl:    Blood Glucose Calibration (MYGLUCOHEALTH CONTROL) SOLN, , Disp: , Rfl:    Calcium Acetate 668 (169 Ca) MG TABS, Take 1 tablet by mouth 3 (three) times daily., Disp: , Rfl:  carvedilol (COREG) 12.5 MG tablet, Take 12.5 mg by mouth., Disp: , Rfl:    Continuous Glucose Receiver (DEXCOM G7 RECEIVER) DEVI, Use as directed, Disp: 1 each, Rfl: 7   Continuous Glucose Sensor (DEXCOM G7 SENSOR) MISC, Apply each device on your skin every 10 days, Disp: 3 each, Rfl: 2   furosemide (LASIX) 40 MG tablet, Take 1 tablet (40 mg total) by mouth daily as needed (swelling in legs or weight gain)., Disp: 30 tablet, Rfl: 1   gabapentin (NEURONTIN) 600 MG tablet, Take 600 mg by mouth daily., Disp: , Rfl:    glucose blood (PRECISION QID TEST) test strip, , Disp: , Rfl:    hydrALAZINE (APRESOLINE) 50 MG tablet, Take 1 tablet (50 mg total) by mouth every 8 (eight) hours., Disp: 180 tablet, Rfl: 3   Insulin Pen Needle  (UNIFINE PENTIPS) 32G X 4 MM MISC, Use in the morning and at bedtime, Disp: 100 each, Rfl: 3   Lancet Devices (ADJUSTABLE LANCING DEVICE) MISC, , Disp: , Rfl:    losartan (COZAAR) 25 MG tablet, Take 1 tablet (25 mg total) by mouth daily., Disp: 30 tablet, Rfl: 11   sevelamer carbonate (RENVELA) 800 MG tablet, Take 800 mg by mouth 3 (three) times daily with meals., Disp: , Rfl:    traZODone (DESYREL) 50 MG tablet, Take 1 tablet (50 mg total) by mouth at bedtime., Disp: 90 tablet, Rfl: 1   Vitamin D, Ergocalciferol, (DRISDOL) 1.25 MG (50000 UNIT) CAPS capsule, Take 1 capsule (50,000 Units total) by mouth every 7 (seven) days., Disp: 5 capsule, Rfl: 0   megestrol (MEGACE) 400 MG/10ML suspension, Take 10 mLs (400 mg total) by mouth 2 (two) times daily. (Patient not taking: Reported on 04/25/2023), Disp: 480 mL, Rfl: 3  Allergies:  Allergies  Allergen Reactions   Penicillins Itching and Other (See Comments)    redness   Strawberry (Diagnostic) Itching and Swelling   Tomato Itching and Swelling    Past Medical History, Surgical history, Social history, and Family History were reviewed and updated.  Review of Systems: Review of Systems  Constitutional:  Positive for appetite change and unexpected weight change.  HENT:  Negative.    Eyes: Negative.   Respiratory: Negative.    Cardiovascular: Negative.   Gastrointestinal: Negative.   Endocrine: Negative.   Genitourinary: Negative.    Musculoskeletal: Negative.   Skin: Negative.   Neurological: Negative.   Hematological: Negative.   Psychiatric/Behavioral: Negative.      Physical Exam:  height is 5\' 7"  (1.702 m) and weight is 110 lb 1.3 oz (49.9 kg). Amber Cross oral temperature is 98.4 F (36.9 C). Amber Cross blood pressure is 128/52 (abnormal) and Amber Cross pulse is 80. Amber Cross respiration is 18 and oxygen saturation is 100%.   Wt Readings from Last 3 Encounters:  04/25/23 110 lb 1.3 oz (49.9 kg)  04/08/23 117 lb 12.8 oz (53.4 kg)  03/11/23 115 lb 3.2 oz  (52.3 kg)    Physical Exam Vitals reviewed.  HENT:     Head: Normocephalic and atraumatic.  Eyes:     Pupils: Pupils are equal, round, and reactive to light.  Cardiovascular:     Rate and Rhythm: Normal rate and regular rhythm.     Heart sounds: Normal heart sounds.  Pulmonary:     Effort: Pulmonary effort is normal.     Breath sounds: Normal breath sounds.  Abdominal:     General: Bowel sounds are normal.     Palpations: Abdomen is soft.  Musculoskeletal:  General: No tenderness or deformity. Normal range of motion.     Cervical back: Normal range of motion.  Lymphadenopathy:     Cervical: No cervical adenopathy.  Skin:    General: Skin is warm and dry.     Findings: No erythema or rash.  Neurological:     Mental Status: Amber Cross is alert and oriented to person, place, and time.  Psychiatric:        Behavior: Behavior normal.        Thought Content: Thought content normal.        Judgment: Judgment normal.      Lab Results  Component Value Date   WBC 7.0 04/25/2023   HGB 10.6 (L) 04/25/2023   HCT 33.7 (L) 04/25/2023   MCV 93.1 04/25/2023   PLT 288 04/25/2023     Chemistry      Component Value Date/Time   NA 141 03/11/2023 1220   K 4.1 03/11/2023 1220   CL 96 03/11/2023 1220   CO2 25 03/11/2023 1220   BUN 26 03/11/2023 1220   CREATININE 3.87 (H) 03/11/2023 1220   CREATININE 4.48 (H) 02/13/2023 1432      Component Value Date/Time   CALCIUM 8.4 (L) 03/11/2023 1220   ALKPHOS 109 03/11/2023 1220   AST 23 03/11/2023 1220   AST 16 02/13/2023 1432   ALT 37 (H) 03/11/2023 1220   ALT 10 02/13/2023 1432   BILITOT <0.2 03/11/2023 1220   BILITOT 0.2 (L) 02/13/2023 1432      Impression and Plan: Amber Cross is a very nice 62 year old African-American female.  Amber Cross has history of limited stage small cell lung cancer.  Unfortunate, because of Amber Cross overall health status, Amber Cross really could not have combined radiation therapy and chemotherapy.  Amber Cross has recurrent  disease.  I think the question is how much of recurrence is this.  Again a PET scan really will be helpful.  Hopefully, we can get 1 done next week.  Hopefully, the Megace ES will help with Amber Cross appetite.  I really do not want to use prednisone because of Amber Cross diabetes.  I think Amber Cross diabetes will clearly be very difficult to treat Amber Cross gave Amber Cross prednisone for Amber Cross appetite.  I had a long talk with Amber Cross today.  I explained to Amber Cross that we are dealing with a situation that, again, we can treat but clearly not going to cure.  Amber Cross understands this.  Amber Cross just wants to feel better.  We will try to get started on 05/06/2023.  This is a Tuesday.  Amber Cross gets Amber Cross dialysis on Monday-Wednesday-Friday.  At least, on Tuesday, Amber Cross will have Amber Cross long treatment.  Amber Cross finishes dialysis at 11:00 Amber Cross says.  As such, we can do the day 2 and day 3 treatments without any problems.  I would like to get Amber Cross back to see me mainly start Amber Cross treatment.   Josph Macho, MD 9/27/20243:17 PM

## 2023-04-25 NOTE — Progress Notes (Signed)
START ON PATHWAY REGIMEN - Small Cell Lung     Cycles 1 through 4, every 21 days:     Atezolizumab      Carboplatin      Etoposide    Cycles 5 and beyond, every 21 days:     Atezolizumab   **Always confirm dose/schedule in your pharmacy ordering system**  Patient Characteristics: Newly Diagnosed, Preoperative or Nonsurgical Candidate (Clinical Staging), First Line, Extensive Stage Therapeutic Status: Newly Diagnosed, Preoperative or Nonsurgical Candidate (Clinical Staging) AJCC T Category: cT1c AJCC N Category: cN2 AJCC M Category: cM1c AJCC 8 Stage Grouping: IVB Stage Classification: Extensive  Intent of Therapy: Non-Curative / Palliative Intent, Discussed with Patient

## 2023-04-26 ENCOUNTER — Other Ambulatory Visit: Payer: Self-pay

## 2023-04-28 ENCOUNTER — Encounter (INDEPENDENT_AMBULATORY_CARE_PROVIDER_SITE_OTHER): Payer: 59 | Admitting: Ophthalmology

## 2023-04-28 NOTE — Progress Notes (Signed)
[facility] - Clinic Note  05/06/2023     CHIEF COMPLAINT Patient presents for Retina Follow Up   HISTORY OF PRESENT ILLNESS: Amber Cross is a 62 y.o. female who presents to the clinic today for:  HPI     Retina Follow Up   Patient presents with  Diabetic Retinopathy.  In both eyes.  This started 4 weeks ago.  Duration of 4 weeks.  Since onset it is stable.        Comments   4 week retina follow up NPDR OU and IVA OU pt is reporting no vision changes noticed pt is reporting floaters in her left eye she denies any flashes she has not checking her blood sugar due to not having a meter her last A1C  7.8 in August 2024      Last edited by Etheleen Mayhew, COT on 05/06/2023  9:34 AM.       Referring physician: Alma Downs, PA-C  [facility] [address] [place],  [place] [address]  HISTORICAL INFORMATION:  Selected notes from the MEDICAL RECORD NUMBER Referred by Alma Downs, PA-C for concern of CME / PDR LEE:  Ocular Hx- PMH-   CURRENT MEDICATIONS: No current outpatient medications on file. (Ophthalmic Drugs)   No current facility-administered medications for this visit. (Ophthalmic Drugs)   Current Outpatient Medications (Other)  Medication Sig   acetaminophen (TYLENOL) 500 MG tablet Take 1,000 mg by mouth every 6 (six) hours as needed for mild pain.   albuterol (VENTOLIN HFA) 108 (90 Base) MCG/ACT inhaler Inhale 2 puffs into the lungs every 6 (six) hours as needed for wheezing or shortness of breath.   amLODipine (NORVASC) 10 MG tablet Take 1 tablet (10 mg total) by mouth daily.   aspirin EC 81 MG tablet Take 1 tablet (81 mg total) by mouth daily. Swallow whole.   atorvastatin (LIPITOR) 80 MG tablet Take 1 tablet (80 mg total) by mouth daily.   B Complex-C-Zn-Folic Acid (DIALYVITE 800 WITH ZINC) 0.8 MG TABS Take 1 tablet by mouth daily.   Blood Glucose Calibration (MYGLUCOHEALTH CONTROL) SOLN    Calcium  Acetate 668 (169 Ca) MG TABS Take 1 tablet by mouth 3 (three) times daily.   carvedilol (COREG) 12.5 MG tablet Take 12.5 mg by mouth.   Continuous Glucose Receiver (DEXCOM G7 RECEIVER) DEVI Use as directed   Continuous Glucose Sensor (DEXCOM G7 SENSOR) MISC Apply each device on your skin every 10 days   furosemide (LASIX) 40 MG tablet Take 1 tablet (40 mg total) by mouth daily as needed (swelling in legs or weight gain).   gabapentin (NEURONTIN) 600 MG tablet Take 600 mg by mouth daily.   glucose blood (PRECISION QID TEST) test strip    hydrALAZINE (APRESOLINE) 50 MG tablet Take 1 tablet (50 mg total) by mouth every 8 (eight) hours.   Insulin Pen Needle (UNIFINE PENTIPS) 32G X 4 MM MISC Use in the morning and at bedtime   Lancet Devices (ADJUSTABLE LANCING DEVICE) MISC    losartan (COZAAR) 25 MG tablet Take 1 tablet (25 mg total) by mouth daily.   megestrol (MEGACE ES) 625 MG/5ML suspension Take 5 mLs (625 mg total) by mouth daily.   megestrol (MEGACE) 400 MG/10ML suspension Take 10 mLs (400 mg total) by mouth 2 (two) times daily. (Patient not taking: Reported on 04/25/2023)   sevelamer carbonate (RENVELA) 800 MG tablet Take 800 mg by mouth 3 (three) times daily with  meals.   traZODone (DESYREL) 50 MG tablet Take 1 tablet (50 mg total) by mouth at bedtime.   Vitamin D, Ergocalciferol, (DRISDOL) 1.25 MG (50000 UNIT) CAPS capsule Take 1 capsule (50,000 Units total) by mouth every 7 (seven) days.   No current facility-administered medications for this visit. (Other)   REVIEW OF SYSTEMS: ROS   Positive for: Musculoskeletal, Endocrine, Cardiovascular, Eyes Negative for: Constitutional, Gastrointestinal, Neurological, Skin, Genitourinary, HENT, Respiratory, Psychiatric, Allergic/Imm, Heme/Lymph Last edited by Etheleen Mayhew, COT on 05/06/2023  9:34 AM.        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   Postop check 08/05/2022   Pseudogout    Stroke (HCC) 10/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   Tamponade 06/18/2022   Tobacco abuse    Past Surgical History:  Procedure Laterality Date   ANTERIOR CRUCIATE LIGAMENT REPAIR Right    AV FISTULA PLACEMENT Left 07/01/2022   Procedure: LEFT ARTERIOVENOUS (AV) FISTULA CREATION;  Surgeon: Chuck Hint, MD;  Location: [facility] OR;  Service: Vascular;  Laterality: Left;  with regional block   BRONCHIAL BIOPSY  12/18/2021   Procedure: BRONCHIAL BIOPSIES;  Surgeon: Josephine Igo, DO;  Location: [facility];  Service: Pulmonary;;   BRONCHIAL BRUSHINGS  12/18/2021   Procedure: BRONCHIAL BRUSHINGS;  Surgeon: Josephine Igo, DO;  Location: [facility];  Service: Pulmonary;;   BRONCHIAL NEEDLE ASPIRATION BIOPSY  12/18/2021   Procedure: BRONCHIAL NEEDLE ASPIRATION BIOPSIES;  Surgeon: Josephine Igo, DO;  Location: [facility];  Service: Pulmonary;;   CATARACT EXTRACTION     CHEST TUBE INSERTION Right 06/18/2022   Procedure: CHEST TUBE INSERTION;  Surgeon: Lovett Sox, MD;  Location: [facility] OR;  Service: Thoracic;  Laterality: Right;   EXCHANGE OF A DIALYSIS CATHETER N/A 08/08/2022   Procedure: EXCHANGE OF A TUNNELED DIALYSIS CATHETER;  Surgeon: Nada Libman, MD;  Location: [facility] OR;  Service: Vascular;  Laterality: N/A;   FIDUCIAL MARKER PLACEMENT  12/18/2021   Procedure: FIDUCIAL MARKER PLACEMENT;  Surgeon: Josephine Igo, DO;  Location: [facility];  Service: Pulmonary;;    IR FLUORO GUIDE CV LINE RIGHT  06/26/2022   IR US GUIDE VASC ACCESS RIGHT  06/26/2022   OVARY SURGERY     RIGHT OOPHORECTOMY Right    SMALL INTESTINE SURGERY     SUBXYPHOID PERICARDIAL WINDOW N/A 06/18/2022   Procedure: SUBXYPHOID PERICARDIAL WINDOW;  Surgeon: Lovett Sox, MD;  Location: [facility] OR;  Service: Thoracic;  Laterality: N/A;   TEE WITHOUT CARDIOVERSION N/A 06/18/2022   Procedure: TRANSESOPHAGEAL ECHOCARDIOGRAM (TEE);  Surgeon: Lovett Sox, MD;  Location: [facility] OR;  Service: Thoracic;  Laterality: N/A;   TUBAL LIGATION     VIDEO BRONCHOSCOPY WITH RADIAL ENDOBRONCHIAL ULTRASOUND  12/18/2021   Procedure: RADIAL ENDOBRONCHIAL ULTRASOUND;  Surgeon: Josephine Igo, DO;  Location: [facility];  Service: Pulmonary;;  FAMILY HISTORY Family History  Problem Relation Age of Onset   Diabetes Mother    Hypertension Mother    Hypertension Father    SOCIAL HISTORY Social History   Tobacco Use   Smoking status: Every Day    Current packs/day: 0.50    Average packs/day: 0.5 packs/day for 40.0 years (20.0 ttl pk-yrs)    Types: Cigarettes   Smokeless tobacco: Never   Tobacco comments:    Cutting back 1/4-1/2 ppd  Vaping Use   Vaping status: Never Used  Substance Use Topics   Alcohol use: Not Currently   Drug use: Yes    Frequency: 4.0 times per week    Types: Marijuana    Comment: smokes every other day      OPHTHALMIC EXAM:  Base Eye Exam     Visual Acuity (Snellen - Linear)       Right Left   Dist cc 20/30 -1 20/150   Dist ph cc 20/25 -2 NI    Correction: Glasses         Tonometry (Tonopen, 9:39 AM)       Right Left   Pressure 14 16         Pupils       Pupils Dark Light Shape React APD   Right PERRL 3 2 Round Brisk None   Left PERRL 3 2 Round Brisk None         Visual Fields       Left Right    Full Full         Extraocular Movement       Right Left    Full, Ortho Full, Ortho         Neuro/Psych     Oriented x3: Yes    Mood/Affect: Normal         Dilation     Both eyes: 2.5% Phenylephrine @ 9:39 AM           Slit Lamp and Fundus Exam     Slit Lamp Exam       Right Left   Lids/Lashes Dermatochalasis - upper lid, mild MGD Dermatochalasis - upper lid, mild MGD   Conjunctiva/Sclera nasal pingeucula, Melanosis nasal pingeucula, Melanosis   Cornea arcus, well healed cataract wound, nasal LRI, 2+ Punctate epithelial erosions arcus, 1+ Punctate epithelial erosions, trace tear film debris   Anterior Chamber deep and clear deep and clear   Iris Round and dilated Round and dilated   Lens PC IOL in good position 2+ Nuclear sclerosis with brunescence, 2-3+ Cortical cataract   Anterior Vitreous mild syneresis, Posterior vitreous detachment mild syneresis         Fundus Exam       Right Left   Disc Pink and Sharp, temporal pallor, temporal PPP Pink and Sharp, temporal pallor, temporal PPP, no NVD   C/D Ratio 0.3 0.3   Macula Good foveal reflex, scattered MA, DBH and punctate exudates -- improved, cystic changes -- persistent Blunted foveal reflex, dense central cluster of exudates -- slightly improved, scattered MA, DBH, central edema -- slightly improved   Vessels attenuated, Tortuous, no NV attenuated, Tortuous, no NV   Periphery Attached, scattered MA Attached, scattered MA           Refraction     Wearing Rx       Sphere Cylinder Axis   Right -2.50 +0.50 158   Left -3.50 +0.75 151           IMAGING AND  PROCEDURES  Imaging and Procedures for 05/06/2023  OCT, Retina - OU - Both Eyes       Right Eye Quality was good. Central Foveal Thickness: 236. Progression has worsened. Findings include normal foveal contour, no SRF, intraretinal hyper-reflective material, intraretinal fluid (persistent IRF / IRHM greatest ST mac -- slightly increased).   Left Eye Quality was good. Central Foveal Thickness: 177. Progression has improved. Findings include no SRF, abnormal foveal contour,  subretinal hyper-reflective material, intraretinal hyper-reflective material, intraretinal fluid, outer retinal atrophy (Persistent IRF/IRHM -- slightly improved).   Notes *Images captured and stored on drive  Diagnosis / Impression:  DME OU OD: persistent IRF / IRHM greatest ST mac -- slightly increased OS: Persistent IRF/IRHM -- slightly improved  Clinical management:  See below  Abbreviations: NFP - Normal foveal profile. CME - cystoid macular edema. PED - pigment epithelial detachment. IRF - intraretinal fluid. SRF - subretinal fluid. EZ - ellipsoid zone. ERM - epiretinal membrane. ORA - outer retinal atrophy. ORT - outer retinal tubulation. SRHM - subretinal hyper-reflective material. IRHM - intraretinal hyper-reflective material            ASSESSMENT/PLAN:  1,2 Severe Non-proliferative diabetic retinopathy, both eyes  - A1C 7.8 (08.13.24), 10.4 (05.09.24), 6.7 (11.30.23) - s/p IVA OD #1 (05.02.24), #2 (05.30.24), #3 (07.24.24), #4 (08.21.24) - s/p IVA OS #1 (04.04.24), #2 (05.02.24), #3 (05.30.24), #4 (07.24.24), #5 (08.21.24) - BCVA 20/25 OD, 20/150 OS - exam shows scattered MA/DBH and exudates OU (OS > OD) - FA (04.04.24) shows leaking MA greatest posteriorly, no NV OU - OCT shows OD: persistent IRF / IRHM greatest ST mac -- slightly increased; OS: Persistent IRF/IRHM -- slightly improved **discussed decreased efficacy / resistance to Avastin and potential benefit of switching medication** - recommend IVA OD #5 and IVE OS #1 today, 10.08.24 for DME - pt wishes to proceed - RBA of procedure discussed, questions answered - Eylea approved with insurance - IVA informed consent obtained and signed, 04.04.24 (OU) - IVE informed consent obtained and signed, 10.08.24 (OU) - see procedure note - f/u in 4 wks -- DFE/OCT, possible injection  3,4. Hypertensive retinopathy OU - discussed importance of tight BP control - monitor  5. Mixed Cataract OS - The symptoms of  cataract, surgical options, and treatments and risks were discussed with patient. - discussed diagnosis and progression - under the expert management of [facility] - pt wishes to have cataract surgery this year in 2024 - clear from a retina standpoint to proceed with cataract surgery when pt and surgeon are ready  - has appointment with [facility] at the end of the month  6. Pseudophakia OD  - s/p CE/IOL OD (Dr. Dione Booze, 2017)  - IOL in good position, doing well  - monitor  Ophthalmic Meds Ordered this visit:  No orders of the defined types were placed in this encounter.   No follow-ups on file.  There are no Patient Instructions on file for this visit.  Explained the diagnoses, plan, and follow up with the patient and they expressed understanding.  Patient expressed understanding of the importance of proper follow up care.  This document serves as a record of services personally performed by Karie Chimera, MD, PhD. It was created on their behalf by Charlette Caffey, COT an ophthalmic technician. The creation of this record is the provider's dictation and/or activities during the visit.    Electronically signed by:  Charlette Caffey, COT  05/06/23 10:32 AM  Arlys John  Wynona Neat, M.D., Ph.D. Diseases & Surgery of the Retina and Vitreous [facility]     Abbreviations: M myopia (nearsighted); A astigmatism; H hyperopia (farsighted); P presbyopia; Mrx spectacle prescription;  CTL contact lenses; OD right eye; OS left eye; OU both eyes  XT exotropia; ET esotropia; PEK punctate epithelial keratitis; PEE punctate epithelial erosions; DES dry eye syndrome; MGD meibomian gland dysfunction; ATs artificial tears; PFAT's preservative free artificial tears; NSC nuclear sclerotic cataract; PSC posterior subcapsular cataract; ERM epi-retinal membrane; PVD posterior vitreous detachment; RD retinal detachment; DM diabetes mellitus; DR diabetic retinopathy; NPDR  non-proliferative diabetic retinopathy; PDR proliferative diabetic retinopathy; CSME clinically significant macular edema; DME diabetic macular edema; dbh dot blot hemorrhages; CWS cotton wool spot; POAG primary open angle glaucoma; C/D cup-to-disc ratio; HVF humphrey visual field; GVF goldmann visual field; OCT optical coherence tomography; IOP intraocular pressure; BRVO Branch retinal vein occlusion; CRVO central retinal vein occlusion; CRAO central retinal artery occlusion; BRAO branch retinal artery occlusion; RT retinal tear; SB scleral buckle; PPV pars plana vitrectomy; VH Vitreous hemorrhage; PRP panretinal laser photocoagulation; IVK intravitreal kenalog; VMT vitreomacular traction; MH Macular hole;  NVD neovascularization of the disc; NVE neovascularization elsewhere; AREDS age related eye disease study; ARMD age related macular degeneration; POAG primary open angle glaucoma; EBMD epithelial/anterior basement membrane dystrophy; ACIOL anterior chamber intraocular lens; IOL intraocular lens; PCIOL posterior chamber intraocular lens; Phaco/IOL phacoemulsification with intraocular lens placement; PRK photorefractive keratectomy; LASIK laser assisted in situ keratomileusis; HTN hypertension; DM diabetes mellitus; COPD chronic obstructive pulmonary disease

## 2023-04-30 ENCOUNTER — Other Ambulatory Visit: Payer: Self-pay

## 2023-04-30 NOTE — Progress Notes (Signed)
Pharmacist Chemotherapy Monitoring - Initial Assessment    Anticipated start date: 05/08/23   The following has been reviewed per standard work regarding the patient's treatment regimen: The patient's diagnosis, treatment plan and drug doses, and organ/hematologic function Lab orders and baseline tests specific to treatment regimen  The treatment plan start date, drug sequencing, and pre-medications Prior authorization status  Patient's documented medication list, including drug-drug interaction screen and prescriptions for anti-emetics and supportive care specific to the treatment regimen The drug concentrations, fluid compatibility, administration routes, and timing of the medications to be used The patient's access for treatment and lifetime cumulative dose history, if applicable  The patient's medication allergies and previous infusion related reactions, if applicable   Changes made to treatment plan:  N/A  Follow up needed:  N/A   Tamanika Heiney, Nevin Bloodgood, RPH, 04/30/2023  8:22 AM

## 2023-05-06 ENCOUNTER — Ambulatory Visit (INDEPENDENT_AMBULATORY_CARE_PROVIDER_SITE_OTHER): Payer: 59 | Admitting: Ophthalmology

## 2023-05-06 ENCOUNTER — Encounter (INDEPENDENT_AMBULATORY_CARE_PROVIDER_SITE_OTHER): Payer: Self-pay | Admitting: Ophthalmology

## 2023-05-06 DIAGNOSIS — Z961 Presence of intraocular lens: Secondary | ICD-10-CM

## 2023-05-06 DIAGNOSIS — H35033 Hypertensive retinopathy, bilateral: Secondary | ICD-10-CM | POA: Diagnosis not present

## 2023-05-06 DIAGNOSIS — I10 Essential (primary) hypertension: Secondary | ICD-10-CM

## 2023-05-06 DIAGNOSIS — Z794 Long term (current) use of insulin: Secondary | ICD-10-CM

## 2023-05-06 DIAGNOSIS — H25812 Combined forms of age-related cataract, left eye: Secondary | ICD-10-CM

## 2023-05-06 DIAGNOSIS — E113413 Type 2 diabetes mellitus with severe nonproliferative diabetic retinopathy with macular edema, bilateral: Secondary | ICD-10-CM | POA: Diagnosis not present

## 2023-05-06 MED ORDER — BEVACIZUMAB CHEMO INJECTION 1.25MG/0.05ML SYRINGE FOR KALEIDOSCOPE
1.2500 mg | INTRAVITREAL | Status: AC | PRN
Start: 2023-05-06 — End: 2023-05-06
  Administered 2023-05-06: 1.25 mg via INTRAVITREAL

## 2023-05-06 MED ORDER — AFLIBERCEPT 2MG/0.05ML IZ SOLN FOR KALEIDOSCOPE
2.0000 mg | INTRAVITREAL | Status: AC | PRN
Start: 2023-05-06 — End: 2023-05-06
  Administered 2023-05-06: 2 mg via INTRAVITREAL

## 2023-05-07 ENCOUNTER — Other Ambulatory Visit: Payer: Self-pay

## 2023-05-07 ENCOUNTER — Telehealth: Payer: Self-pay | Admitting: Dietician

## 2023-05-07 NOTE — Telephone Encounter (Signed)
Patient screened on MST. First attempt to reach. Encouraged her to reach out ot her RD at Dialysis center or return call. Provided my cell# on voice mail to return call to set up a nutrition consult.  Gennaro Africa, RDN, LDN Registered Dietitian, [facility] Part Time Remote (Usual office hours: Tuesday-Thursday) Cell: [phone]

## 2023-05-08 ENCOUNTER — Encounter (INDEPENDENT_AMBULATORY_CARE_PROVIDER_SITE_OTHER): Payer: Self-pay | Admitting: Ophthalmology

## 2023-05-08 ENCOUNTER — Inpatient Hospital Stay: Payer: 59

## 2023-05-08 ENCOUNTER — Inpatient Hospital Stay (HOSPITAL_BASED_OUTPATIENT_CLINIC_OR_DEPARTMENT_OTHER): Payer: 59 | Admitting: Hematology & Oncology

## 2023-05-08 ENCOUNTER — Inpatient Hospital Stay: Payer: 59 | Attending: Radiation Oncology

## 2023-05-08 ENCOUNTER — Encounter: Payer: Self-pay | Admitting: Hematology & Oncology

## 2023-05-08 ENCOUNTER — Encounter: Payer: 59 | Admitting: Internal Medicine

## 2023-05-08 VITALS — BP 180/76 | HR 89 | Resp 17

## 2023-05-08 DIAGNOSIS — Z5112 Encounter for antineoplastic immunotherapy: Secondary | ICD-10-CM | POA: Insufficient documentation

## 2023-05-08 DIAGNOSIS — E119 Type 2 diabetes mellitus without complications: Secondary | ICD-10-CM | POA: Diagnosis not present

## 2023-05-08 DIAGNOSIS — C3491 Malignant neoplasm of unspecified part of right bronchus or lung: Secondary | ICD-10-CM | POA: Insufficient documentation

## 2023-05-08 DIAGNOSIS — F17210 Nicotine dependence, cigarettes, uncomplicated: Secondary | ICD-10-CM | POA: Insufficient documentation

## 2023-05-08 DIAGNOSIS — C3411 Malignant neoplasm of upper lobe, right bronchus or lung: Secondary | ICD-10-CM | POA: Diagnosis not present

## 2023-05-08 DIAGNOSIS — Z5189 Encounter for other specified aftercare: Secondary | ICD-10-CM | POA: Insufficient documentation

## 2023-05-08 DIAGNOSIS — Z5111 Encounter for antineoplastic chemotherapy: Secondary | ICD-10-CM | POA: Diagnosis present

## 2023-05-08 LAB — CMP (CANCER CENTER ONLY)
ALT: 7 U/L (ref 0–44)
AST: 13 U/L — ABNORMAL LOW (ref 15–41)
Albumin: 3.5 g/dL (ref 3.5–5.0)
Alkaline Phosphatase: 64 U/L (ref 38–126)
Anion gap: 13 (ref 5–15)
BUN: 42 mg/dL — ABNORMAL HIGH (ref 8–23)
CO2: 30 mmol/L (ref 22–32)
Calcium: 8.7 mg/dL — ABNORMAL LOW (ref 8.9–10.3)
Chloride: 95 mmol/L — ABNORMAL LOW (ref 98–111)
Creatinine: 4.75 mg/dL — ABNORMAL HIGH (ref 0.44–1.00)
GFR, Estimated: 10 mL/min — ABNORMAL LOW (ref >60)
Glucose, Bld: 232 mg/dL — ABNORMAL HIGH (ref 70–99)
Potassium: 4 mmol/L (ref 3.5–5.1)
Sodium: 138 mmol/L (ref 135–145)
Total Bilirubin: 0.2 mg/dL — ABNORMAL LOW (ref 0.3–1.2)
Total Protein: 6.7 g/dL (ref 6.5–8.1)

## 2023-05-08 LAB — CBC WITH DIFFERENTIAL (CANCER CENTER ONLY)
Abs Immature Granulocytes: 0.06 10*3/uL (ref 0.00–0.07)
Basophils Absolute: 0 10*3/uL (ref 0.0–0.1)
Basophils Relative: 0 %
Eosinophils Absolute: 0.1 10*3/uL (ref 0.0–0.5)
Eosinophils Relative: 1 %
HCT: 31.4 % — ABNORMAL LOW (ref 36.0–46.0)
Hemoglobin: 10.1 g/dL — ABNORMAL LOW (ref 12.0–15.0)
Immature Granulocytes: 1 %
Lymphocytes Relative: 9 %
Lymphs Abs: 1 10*3/uL (ref 0.7–4.0)
MCH: 29.4 pg (ref 26.0–34.0)
MCHC: 32.2 g/dL (ref 30.0–36.0)
MCV: 91.3 fL (ref 80.0–100.0)
Monocytes Absolute: 0.6 10*3/uL (ref 0.1–1.0)
Monocytes Relative: 5 %
Neutro Abs: 8.7 10*3/uL — ABNORMAL HIGH (ref 1.7–7.7)
Neutrophils Relative %: 84 %
Platelet Count: 200 10*3/uL (ref 150–400)
RBC: 3.44 MIL/uL — ABNORMAL LOW (ref 3.87–5.11)
RDW: 15.8 % — ABNORMAL HIGH (ref 11.5–15.5)
WBC Count: 10.4 10*3/uL (ref 4.0–10.5)
nRBC: 0 % (ref 0.0–0.2)

## 2023-05-08 LAB — TSH: TSH: 0.573 u[IU]/mL (ref 0.350–4.500)

## 2023-05-08 LAB — LACTATE DEHYDROGENASE: LDH: 248 U/L — ABNORMAL HIGH (ref 98–192)

## 2023-05-08 LAB — PREALBUMIN: Prealbumin: 29 mg/dL (ref 18–38)

## 2023-05-08 MED ORDER — PROCHLORPERAZINE MALEATE 10 MG PO TABS: 10.0000 mg | ORAL_TABLET | Freq: Four times a day (QID) | ORAL | 1 refills | Status: DC | PRN

## 2023-05-08 MED ORDER — PALONOSETRON HCL INJECTION 0.25 MG/5ML
0.2500 mg | Freq: Once | INTRAVENOUS | Status: AC
  Administered 2023-05-08: 0.25 mg via INTRAVENOUS
  Filled 2023-05-08: qty 5

## 2023-05-08 MED ORDER — SODIUM CHLORIDE 0.9 % IV SOLN: Freq: Once | INTRAVENOUS | Status: AC

## 2023-05-08 MED ORDER — SODIUM CHLORIDE 0.9 % IV SOLN
174.0000 mg | Freq: Once | INTRAVENOUS | Status: AC
  Administered 2023-05-08: 170 mg via INTRAVENOUS
  Filled 2023-05-08: qty 17

## 2023-05-08 MED ORDER — SODIUM CHLORIDE 0.9 % IV SOLN
1200.0000 mg | Freq: Once | INTRAVENOUS | Status: AC
  Administered 2023-05-08: 1200 mg via INTRAVENOUS
  Filled 2023-05-08: qty 20

## 2023-05-08 MED ORDER — SODIUM CHLORIDE 0.9 % IV SOLN
10.0000 mg | Freq: Once | INTRAVENOUS | Status: AC
  Administered 2023-05-08: 10 mg via INTRAVENOUS
  Filled 2023-05-08: qty 10

## 2023-05-08 MED ORDER — SODIUM CHLORIDE 0.9 % IV SOLN
150.0000 mg | Freq: Once | INTRAVENOUS | Status: AC
  Administered 2023-05-08: 150 mg via INTRAVENOUS
  Filled 2023-05-08: qty 150

## 2023-05-08 MED ORDER — SODIUM CHLORIDE 0.9 % IV SOLN
80.0000 mg/m2 | Freq: Once | INTRAVENOUS | Status: AC
  Administered 2023-05-08: 124 mg via INTRAVENOUS
  Filled 2023-05-08: qty 6.2

## 2023-05-08 MED ORDER — DEXAMETHASONE 4 MG PO TABS: ORAL_TABLET | ORAL | 0 refills | Status: DC

## 2023-05-08 MED ORDER — ONDANSETRON HCL 8 MG PO TABS: 8.0000 mg | ORAL_TABLET | Freq: Three times a day (TID) | ORAL | 1 refills | Status: DC | PRN

## 2023-05-08 NOTE — Progress Notes (Deleted)
CC: ***  HPI:  [name] is a 62 y.o. female living with a history stated below and presents today for ***. Please see problem based assessment and plan for additional details.  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     albuterol (VENTOLIN HFA) 108 (90 Base) MCG/ACT inhaler Inhale 2 puffs into the lungs every 6 (six) hours as needed for wheezing or shortness of breath. 6.7 g 2   amLODipine (NORVASC) 10 MG tablet Take 1 tablet (10 mg total) by mouth daily. 90 tablet 3   aspirin EC 81 MG tablet Take 1 tablet (81 mg total) by mouth daily. Swallow whole. 30 tablet 11   atorvastatin (LIPITOR) 80 MG tablet Take 1 tablet (80 mg total) by mouth daily. 90 tablet 3   B Complex-C-Zn-Folic Acid (DIALYVITE 800 WITH ZINC) 0.8 MG TABS Take 1 tablet by mouth daily.     Blood Glucose Calibration (MYGLUCOHEALTH CONTROL) SOLN      Calcium Acetate 668 (169 Ca) MG TABS Take 1 tablet by mouth 3 (three) times daily.     carvedilol (COREG) 12.5 MG tablet Take 12.5 mg by mouth.     Continuous Glucose Receiver (DEXCOM G7 RECEIVER) DEVI Use as directed 1 each 7   Continuous Glucose Sensor (DEXCOM G7 SENSOR) MISC Apply each device on your skin every 10 days 3 each 2   furosemide (LASIX) 40 MG tablet Take 1 tablet (40 mg  total) by mouth daily as needed (swelling in legs or weight gain). 30 tablet 1   gabapentin (NEURONTIN) 600 MG tablet Take 600 mg by mouth daily.     glucose blood (PRECISION QID TEST) test strip      hydrALAZINE (APRESOLINE) 50 MG tablet Take 1 tablet (50 mg total) by mouth every 8 (eight) hours. 180 tablet 3   Insulin Pen Needle (UNIFINE PENTIPS) 32G X 4 MM MISC Use in the morning and at bedtime 100 each 3   Lancet Devices (ADJUSTABLE LANCING DEVICE) MISC      losartan (COZAAR) 25 MG tablet Take 1 tablet (25 mg total) by mouth daily. 30 tablet 11   megestrol (MEGACE ES) 625 MG/5ML suspension Take 5 mLs (625 mg total) by mouth daily. 150 mL 4   megestrol (MEGACE) 400 MG/10ML suspension Take 10 mLs (400 mg total) by mouth 2 (two) times daily. (Patient not taking: Reported on 04/25/2023) 480 mL 3   sevelamer carbonate (RENVELA) 800 MG tablet Take 800 mg by mouth 3 (three) times daily with meals.     traZODone (DESYREL) 50 MG tablet Take 1 tablet (50 mg total) by mouth at bedtime. 90 tablet 1   Vitamin D, Ergocalciferol, (  DRISDOL) 1.25 MG (50000 UNIT) CAPS capsule Take 1 capsule (50,000 Units total) by mouth every 7 (seven) days. 5 capsule 0   No current facility-administered medications on file prior to visit.    Family History  Problem Relation Age of Onset   Diabetes Mother    Hypertension Mother    Hypertension Father     Social History   Socioeconomic History   Marital status: Married    Spouse name: Not on file   Number of children: Not on file   Years of education: Not on file   Highest education level: Not on file  Occupational History   Not on file  Tobacco Use   Smoking status: Every Day    Current packs/day: 0.50    Average packs/day: 0.5 packs/day for 40.0 years (20.0 ttl pk-yrs)    Types: Cigarettes   Smokeless tobacco: Never   Tobacco comments:    Cutting back 1/4-1/2 ppd  Vaping Use   Vaping status: Never Used  Substance and Sexual Activity   Alcohol use: Not  Currently   Drug use: Yes    Frequency: 4.0 times per week    Types: Marijuana    Comment: smokes every other day   Sexual activity: Not Currently    Birth control/protection: Abstinence  Other Topics Concern   Not on file  Social History Narrative   Not on file   Social Determinants of Health   Financial Resource Strain: High Risk (03/11/2023)   Overall Financial Resource Strain (CARDIA)    Difficulty of Paying Living Expenses: Hard  Food Insecurity: Food Insecurity Present (03/11/2023)   Hunger Vital Sign    Worried About Running Out of Food in the Last Year: Sometimes true    Ran Out of Food in the Last Year: Sometimes true  Transportation Needs: No Transportation Needs (03/11/2023)   PRAPARE - Administrator, Civil Service (Medical): No    Lack of Transportation (Non-Medical): No  Physical Activity: Inactive (03/11/2023)   Exercise Vital Sign    Days of Exercise per Week: 0 days    Minutes of Exercise per Session: 0 min  Stress: Stress Concern Present (03/11/2023)   [employer] of Occupational Health - Occupational Stress Questionnaire    Feeling of Stress : To some extent  Social Connections: Socially Isolated (03/11/2023)   Social Connection and Isolation Panel [NHANES]    Frequency of Communication with Friends and Family: Never    Frequency of Social Gatherings with Friends and Family: Never    Attends Religious Services: Never    Database administrator or Organizations: No    Attends Banker Meetings: Never    Marital Status: Never married  Intimate Partner Violence: Not At Risk (03/11/2023)   Humiliation, Afraid, Rape, and Kick questionnaire    Fear of Current or Ex-Partner: No    Emotionally Abused: No    Physically Abused: No    Sexually Abused: No    Review of Systems: ROS negative except for what is noted on the assessment and plan.  There were no vitals filed for this visit.  Physical Exam: Constitutional: well-appearing ***  sitting in ***, in no acute distress HENT: normocephalic atraumatic, mucous membranes moist Eyes: conjunctiva non-erythematous Cardiovascular: regular rate and rhythm, no m/r/g Pulmonary/Chest: normal work of breathing on room air, lungs clear to auscultation bilaterally Abdominal: soft, non-tender, non-distended MSK: normal bulk and tone Neurological: alert & oriented x 3, no focal deficit Skin: warm and dry Psych:  normal mood and behavior  Assessment & Plan:   DM: A1c7.8% in August but on dialysis  - dexcom data - no meds?  Incontinence supplies  Patient {GC/GE:[id number]::"discussed with","seen with"} Dr. {[id number]::"[id number]","U. Hoffman","Mullen","Narendra","Vincent","Guilloud","Lau","Machen"}  No problem-specific Assessment & Plan notes found for this encounter.   Elza Rafter, D.O. [facility] Internal Medicine, PGY-3 Phone: [phone] Date 05/08/2023 Time 6:53 AM

## 2023-05-08 NOTE — Progress Notes (Signed)
Hematology and Oncology Follow Up Visit  Amber Cross [id number] 07/05/1961 62 y.o. 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  Unfortunately, it has been quite a while since we last saw her.  I am still not sure as to why it is taking so long for her to come back to see [place].  Regardless, she clearly has progressive disease in my opinion.  We did her last PET scan, it clearly showed that she had disease recurrence.  She had a PET scan back in June.  She had disease in the mediastinal right hilar lymph nodes.  She had a right supraclavicular lymph node.  Now, she has a subcutaneous nodules.  They appear to be on the upper abdomen.  She is lost weight.  Today, her weight is 110 pounds.  When we last saw her, her weight was 111 pounds.  She is not taking the Megace elixir.  She says that taste terrible.  I will try her on the Megace ES elixir.  Maybe this will taste better for her.  She gets dialysis.  We will try to get her chemotherapy schedule so that she will have a long day of chemotherapy on Tuesday.  We probably had to make a dosage adjustment to her chemotherapy.  Of note, she never has had chemotherapy.  I think that she would do fairly well with the carboplatinum/etoposide/Tecentriq.  Again, we will have to make some doses adjustments.  Do think she findings have another PET scan.  We will have to get the PET scan at [facility].  She still has pretty decent veins.  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  As such, we will have to use the right arm.  I do not think we have to do a Port-A-Cath right now.  She understands that we are dealing with it is treatable but not curable.  She just wants to feel better.   Maybe, if we can get her tumor shrinking, she will be a little bit better.  She is still smoking.  She probably smokes about half pack a day cigarettes.  Overall, I would say that her performance status is probably ECOG 2.   Medications:  Current Outpatient Medications:    acetaminophen (TYLENOL) 500 MG tablet, Take 1,000 mg by mouth every 6 (six) hours as needed for mild pain., Disp: , Rfl:    albuterol (VENTOLIN HFA) 108 (90 Base) MCG/ACT inhaler, Inhale 2 puffs into the lungs every 6 (six) hours as needed for wheezing or shortness of breath., Disp: 6.7 g, Rfl: 2   amLODipine (NORVASC) 10 MG tablet, Take 1 tablet (10 mg total) by mouth daily., Disp: 90 tablet, Rfl: 3   aspirin EC 81 MG tablet, Take 1 tablet (81 mg total) by mouth daily. Swallow whole., Disp: 30 tablet, Rfl: 11   atorvastatin (LIPITOR) 80 MG tablet, Take 1 tablet (80 mg total) by mouth daily., Disp: 90 tablet, Rfl: 3   B Complex-C-Zn-Folic Acid (DIALYVITE 800 WITH ZINC) 0.8 MG TABS, Take 1 tablet by mouth daily., Disp: , Rfl:    Blood Glucose Calibration (MYGLUCOHEALTH CONTROL) SOLN, , Disp: , Rfl:    Calcium Acetate 668 (169 Ca) MG TABS, Take 1 tablet by mouth 3 (three) times daily., Disp: , Rfl:  carvedilol (COREG) 12.5 MG tablet, Take 12.5 mg by mouth., Disp: , Rfl:    Continuous Glucose Receiver (DEXCOM G7 RECEIVER) DEVI, Use as directed, Disp: 1 each, Rfl: 7   Continuous Glucose Sensor (DEXCOM G7 SENSOR) MISC, Apply each device on your skin every 10 days, Disp: 3 each, Rfl: 2   furosemide (LASIX) 40 MG tablet, Take 1 tablet (40 mg total) by mouth daily as needed (swelling in legs or weight gain)., Disp: 30 tablet, Rfl: 1   gabapentin (NEURONTIN) 600 MG tablet, Take 600 mg by mouth daily., Disp: , Rfl:    glucose blood (PRECISION QID TEST) test strip, , Disp: , Rfl:    hydrALAZINE (APRESOLINE) 50 MG tablet, Take 1 tablet (50 mg total) by mouth every 8 (eight) hours., Disp: 180 tablet, Rfl: 3   Insulin Pen Needle  (UNIFINE PENTIPS) 32G X 4 MM MISC, Use in the morning and at bedtime, Disp: 100 each, Rfl: 3   Lancet Devices (ADJUSTABLE LANCING DEVICE) MISC, , Disp: , Rfl:    losartan (COZAAR) 25 MG tablet, Take 1 tablet (25 mg total) by mouth daily., Disp: 30 tablet, Rfl: 11   megestrol (MEGACE ES) 625 MG/5ML suspension, Take 5 mLs (625 mg total) by mouth daily., Disp: 150 mL, Rfl: 4   megestrol (MEGACE) 400 MG/10ML suspension, Take 10 mLs (400 mg total) by mouth 2 (two) times daily., Disp: 480 mL, Rfl: 3   sevelamer carbonate (RENVELA) 800 MG tablet, Take 800 mg by mouth 3 (three) times daily with meals., Disp: , Rfl:    traZODone (DESYREL) 50 MG tablet, Take 1 tablet (50 mg total) by mouth at bedtime., Disp: 90 tablet, Rfl: 1   Vitamin D, Ergocalciferol, (DRISDOL) 1.25 MG (50000 UNIT) CAPS capsule, Take 1 capsule (50,000 Units total) by mouth every 7 (seven) days., Disp: 5 capsule, Rfl: 0  Allergies:  Allergies  Allergen Reactions   Penicillins Itching and Other (See Comments)    redness   Strawberry (Diagnostic) Itching and Swelling   Tomato Itching and Swelling    Past Medical History, Surgical history, Social history, and Family History were reviewed and updated.  Review of Systems: Review of Systems  Constitutional:  Positive for appetite change and unexpected weight change.  HENT:  Negative.    Eyes: Negative.   Respiratory: Negative.    Cardiovascular: Negative.   Gastrointestinal: Negative.   Endocrine: Negative.   Genitourinary: Negative.    Musculoskeletal: Negative.   Skin: Negative.   Neurological: Negative.   Hematological: Negative.   Psychiatric/Behavioral: Negative.      Physical Exam:  weight is 115 lb 1.9 oz (52.2 kg). Her oral temperature is 98.7 F (37.1 C). Her blood pressure is 167/67 (abnormal) and her pulse is 83. Her respiration is 18 and oxygen saturation is 96%.   Wt Readings from Last 3 Encounters:  05/08/23 115 lb 1.9 oz (52.2 kg)  04/25/23 110 lb 1.3 oz  (49.9 kg)  04/08/23 117 lb 12.8 oz (53.4 kg)    Physical Exam Vitals reviewed.  HENT:     Head: Normocephalic and atraumatic.  Eyes:     Pupils: Pupils are equal, round, and reactive to light.  Cardiovascular:     Rate and Rhythm: Normal rate and regular rhythm.     Heart sounds: Normal heart sounds.  Pulmonary:     Effort: Pulmonary effort is normal.     Breath sounds: Normal breath sounds.  Abdominal:     General: Bowel sounds are normal.  Palpations: Abdomen is soft.  Musculoskeletal:        General: No tenderness or deformity. Normal range of motion.     Cervical back: Normal range of motion.  Lymphadenopathy:     Cervical: No cervical adenopathy.  Skin:    General: Skin is warm and dry.     Findings: No erythema or rash.  Neurological:     Mental Status: She is alert and oriented to person, place, and time.  Psychiatric:        Behavior: Behavior normal.        Thought Content: Thought content normal.        Judgment: Judgment normal.      Lab Results  Component Value Date   WBC 10.4 05/08/2023   HGB 10.1 (L) 05/08/2023   HCT 31.4 (L) 05/08/2023   MCV 91.3 05/08/2023   PLT 200 05/08/2023     Chemistry      Component Value Date/Time   NA 139 04/25/2023 1440   NA 141 03/11/2023 1220   K 3.7 04/25/2023 1440   CL 94 (L) 04/25/2023 1440   CO2 32 04/25/2023 1440   BUN 13 04/25/2023 1440   BUN 26 03/11/2023 1220   CREATININE 3.12 (H) 04/25/2023 1440      Component Value Date/Time   CALCIUM 8.9 04/25/2023 1440   ALKPHOS 73 04/25/2023 1440   AST 13 (L) 04/25/2023 1440   ALT 11 04/25/2023 1440   BILITOT 0.2 (L) 04/25/2023 1440      Impression and Plan: Amber Cross is a very nice 62 year old African-American female.  She has history of limited stage small cell lung cancer.  Unfortunate, because of her overall health status, she really could not have combined radiation therapy and chemotherapy.  She has recurrent disease.  I think the question is how  much of recurrence is this.  Again a PET scan really will be helpful.  Hopefully, we can get 1 done next week.  Hopefully, the Megace ES will help with her appetite.  I really do not want to use prednisone because of her diabetes.  I think her diabetes will clearly be very difficult to treat her gave her prednisone for her appetite.  I had a long talk with her today.  I explained to her that we are dealing with a situation that, again, we can treat but clearly not going to cure.  She understands this.  She just wants to feel better.  We will try to get started on 05/06/2023.  This is a Tuesday.  She gets her dialysis on Monday-Wednesday-Friday.  At least, on Tuesday, she will have her long treatment.  She finishes dialysis at 11:00 she says.  As such, we can do the day 2 and day 3 treatments without any problems.  We will see her back when she has her second cycle of treatment.  Josph Macho, MD 10/10/20248:53 AM

## 2023-05-08 NOTE — Progress Notes (Signed)
Reviewed pt labs with Dr. Myna Hidalgo and pt ok to treat despite lab values.

## 2023-05-08 NOTE — Patient Instructions (Signed)
[facility] [facility]  Discharge Instructions: Thank you for choosing [facility] to provide your oncology and hematology care.   If you have a lab appointment with the Cancer Center, please go directly to the Cancer Center and check in at the registration area.  Wear comfortable clothing and clothing appropriate for easy access to any Portacath or PICC line.   We strive to give you quality time with your provider. You may need to reschedule your appointment if you arrive late (15 or more minutes).  Arriving late affects you and other patients whose appointments are after yours.  Also, if you miss three or more appointments without notifying the office, you may be dismissed from the clinic at the provider's discretion.      For prescription refill requests, have your pharmacy contact our office and allow 72 hours for refills to be completed.    Today you received the following chemotherapy and/or immunotherapy agents Tecentriq, Carboplatin and Etoposide      To help prevent nausea and vomiting after your treatment, we encourage you to take your nausea medication as directed.  BELOW ARE SYMPTOMS THAT SHOULD BE REPORTED IMMEDIATELY: *FEVER GREATER THAN 100.4 F (38 C) OR HIGHER *CHILLS OR SWEATING *NAUSEA AND VOMITING THAT IS NOT CONTROLLED WITH YOUR NAUSEA MEDICATION *UNUSUAL SHORTNESS OF BREATH *UNUSUAL BRUISING OR BLEEDING *URINARY PROBLEMS (pain or burning when urinating, or frequent urination) *BOWEL PROBLEMS (unusual diarrhea, constipation, pain near the anus) TENDERNESS IN MOUTH AND THROAT WITH OR WITHOUT PRESENCE OF ULCERS (sore throat, sores in mouth, or a toothache) UNUSUAL RASH, SWELLING OR PAIN  UNUSUAL VAGINAL DISCHARGE OR ITCHING   Items with * indicate a potential emergency and should be followed up as soon as possible or go to the Emergency Department if any problems should occur.  Please show the CHEMOTHERAPY ALERT CARD or  IMMUNOTHERAPY ALERT CARD at check-in to the Emergency Department and triage nurse. Should you have questions after your visit or need to cancel or reschedule your appointment, please contact [facility] [place]  [phone] and follow the prompts.  Office hours are 8:00 a.m. to 4:30 p.m. Monday - Friday. Please note that voicemails left after 4:00 p.m. may not be returned until the following business day.  We are closed weekends and major holidays. You have access to a nurse at all times for urgent questions. Please call the main number to the clinic [phone] and follow the prompts.  For any non-urgent questions, you may also contact your provider using MyChart. We now offer e-Visits for anyone 81 and older to request care online for non-urgent symptoms. For details visit [email or web address].   Also download the MyChart app! Go to the app store, search "MyChart", open the app, select [facility], and log in with your MyChart username and password.

## 2023-05-09 ENCOUNTER — Inpatient Hospital Stay: Payer: 59

## 2023-05-09 VITALS — BP 166/67 | HR 90 | Temp 97.6°F | Resp 16 | Ht 67.0 in | Wt 115.0 lb

## 2023-05-09 DIAGNOSIS — C3411 Malignant neoplasm of upper lobe, right bronchus or lung: Secondary | ICD-10-CM

## 2023-05-09 DIAGNOSIS — Z5111 Encounter for antineoplastic chemotherapy: Secondary | ICD-10-CM | POA: Diagnosis not present

## 2023-05-09 LAB — T4: T4, Total: 9.1 ug/dL (ref 4.5–12.0)

## 2023-05-09 MED ORDER — SODIUM CHLORIDE 0.9 % IV SOLN
10.0000 mg | Freq: Once | INTRAVENOUS | Status: AC
  Administered 2023-05-09: 10 mg via INTRAVENOUS
  Filled 2023-05-09: qty 10

## 2023-05-09 MED ORDER — HOT PACK MISC ONCOLOGY: 1.0000 | Freq: Once | Status: DC | PRN

## 2023-05-09 MED ORDER — SODIUM CHLORIDE 0.9 % IV SOLN
80.0000 mg/m2 | Freq: Once | INTRAVENOUS | Status: AC
  Administered 2023-05-09: 124 mg via INTRAVENOUS
  Filled 2023-05-09: qty 6.2

## 2023-05-09 MED ORDER — SODIUM CHLORIDE 0.9% FLUSH: 10.0000 mL | INTRAVENOUS | Status: DC | PRN

## 2023-05-09 MED ORDER — HEPARIN SOD (PORK) LOCK FLUSH 100 UNIT/ML IV SOLN: 500.0000 [IU] | Freq: Once | INTRAVENOUS | Status: DC | PRN

## 2023-05-09 MED ORDER — SODIUM CHLORIDE 0.9 % IV SOLN: Freq: Once | INTRAVENOUS | Status: AC

## 2023-05-09 NOTE — Patient Instructions (Signed)
Etoposide Capsules What is this medication? ETOPOSIDE (e toe POE side) treats lung cancer. It works by slowing down the growth of cancer cells. This medicine may be used for other purposes; ask your health care provider or pharmacist if you have questions. COMMON BRAND NAME(S): VePesid What should I tell my care team before I take this medication? They need to know if you have any of these conditions: Infection Kidney disease Liver disease Low blood counts, such as low white cell, platelet, red cell counts An unusual or allergic reaction to etoposide, other medications, foods, dyes, or preservatives Pregnant or trying to get pregnant Breastfeeding How should I use this medication? Take this medication by mouth with a glass of water. Take it as directed on the prescription label. Do not cut, crush, or chew this medication. Swallow the capsules whole. Do not take it more often than directed. Keep taking it unless your care team tells you to stop. Handling this medication may be harmful. Wear gloves while touching this medication or bottle. Talk to your care team about how to handle this medication. Special instructions may apply. Talk to your care team about the use of this medication in children. Special care may be needed. Overdosage: If you think you have taken too much of this medicine contact a poison control center or emergency room at once. NOTE: This medicine is only for you. Do not share this medicine with others. What if I miss a dose? If you miss a dose, take it as soon as you can. If it is almost time for your next dose, take only that dose. Do not take double or extra doses. What may interact with this medication? Cyclosporine Warfarin This list may not describe all possible interactions. Give your health care provider a list of all the medicines, herbs, non-prescription drugs, or dietary supplements you use. Also tell them if you smoke, drink alcohol, or use illegal drugs. Some  items may interact with your medicine. What should I watch for while using this medication? Your condition will be monitored carefully while you are receiving this medication. This medication may make you feel generally unwell. This is not uncommon as chemotherapy can affect healthy cells as well as cancer cells. Report any side effects. Continue your course of treatment even though you feel ill unless your care team tells you to stop. This medication may increase your risk of getting an infection. Call your care team for advice if you get a fever, chills, sore throat, or other symptoms of a cold or flu. Do not treat yourself. Try to avoid being around people who are sick. This medication may increase your risk to bruise or bleed. Call your care team if you notice any unusual bleeding. Talk to your care team about your risk of cancer. You may be more at risk for certain types of cancers if you take this medication. Talk to your care team if you may be pregnant. Serious birth defects can occur if you take this medication during pregnancy and for 6 months after the last dose. You will need a negative pregnancy test before starting this medication. Contraception is recommended while taking this medication and for 6 months after the last dose. Your care team can help you find the option that works for you. If your partner can get pregnant, use a condom during sex while taking this medication and for 4 months after the last dose. Do not breastfeed while taking this medication. This medication may cause infertility. Talk  to your care team if you are concerned about your fertility. What side effects may I notice from receiving this medication? Side effects that you should report to your care team as soon as possible: Allergic reactions--skin rash, itching, hives, swelling of the face, lips, tongue, or throat Infection--fever, chills, cough, sore throat, wounds that don't heal, pain or trouble when passing  urine, general feeling of discomfort or being unwell Low red blood cell level--unusual weakness or fatigue, dizziness, headache, trouble breathing Unusual bruising or bleeding Side effects that usually do not require medical attention (report to your care team if they continue or are bothersome): Diarrhea Fatigue Hair loss Loss of appetite Nausea Pain, redness, or swelling with sores inside the mouth or throat Vomiting This list may not describe all possible side effects. Call your doctor for medical advice about side effects. You may report side effects to FDA at 1-800-FDA-1088. Where should I keep my medication? Keep out of the reach of children and pets. Store in a refrigerator between 2 and 8 degrees C (36 and 46 degrees F). Do not freeze. Get rid any unused medication after the expiration date. To get rid of medications that are no longer needed or have expired: Take the medication to a medication take-back program. Check with your pharmacy or law enforcement to find a location. If you cannot return the medication, ask your pharmacist or care team how to get rid of this medication safely. NOTE: This sheet is a summary. It may not cover all possible information. If you have questions about this medicine, talk to your doctor, pharmacist, or health care provider.  2024 Elsevier/Gold Standard (2021-12-06 00:00:00)

## 2023-05-09 NOTE — Progress Notes (Signed)
MD made aware of elevated BP. Pt came straight from Dialysis and has not taken any of her meds. " Ok to proceed withy treatment per Dr. Myna Hidalgo"

## 2023-05-12 ENCOUNTER — Inpatient Hospital Stay: Payer: 59

## 2023-05-12 VITALS — BP 184/71 | HR 82 | Temp 98.1°F | Resp 17

## 2023-05-12 DIAGNOSIS — C3411 Malignant neoplasm of upper lobe, right bronchus or lung: Secondary | ICD-10-CM

## 2023-05-12 DIAGNOSIS — Z5111 Encounter for antineoplastic chemotherapy: Secondary | ICD-10-CM | POA: Diagnosis not present

## 2023-05-12 MED ORDER — SODIUM CHLORIDE 0.9 % IV SOLN
80.0000 mg/m2 | Freq: Once | INTRAVENOUS | Status: AC
  Administered 2023-05-12: 124 mg via INTRAVENOUS
  Filled 2023-05-12: qty 6.2

## 2023-05-12 MED ORDER — SODIUM CHLORIDE 0.9 % IV SOLN
10.0000 mg | Freq: Once | INTRAVENOUS | Status: AC
  Administered 2023-05-12: 10 mg via INTRAVENOUS
  Filled 2023-05-12: qty 10

## 2023-05-12 MED ORDER — HOT PACK MISC ONCOLOGY: 1.0000 | Freq: Once | Status: DC | PRN

## 2023-05-12 MED ORDER — SODIUM CHLORIDE 0.9 % IV SOLN: Freq: Once | INTRAVENOUS | Status: AC

## 2023-05-13 ENCOUNTER — Inpatient Hospital Stay: Payer: 59

## 2023-05-14 ENCOUNTER — Inpatient Hospital Stay: Payer: 59

## 2023-05-14 VITALS — BP 180/62 | Temp 97.9°F | Resp 20

## 2023-05-14 DIAGNOSIS — C3411 Malignant neoplasm of upper lobe, right bronchus or lung: Secondary | ICD-10-CM

## 2023-05-14 DIAGNOSIS — Z5111 Encounter for antineoplastic chemotherapy: Secondary | ICD-10-CM | POA: Diagnosis not present

## 2023-05-14 MED ORDER — PEGFILGRASTIM-CBQV 6 MG/0.6ML ~~LOC~~ SOSY
6.0000 mg | PREFILLED_SYRINGE | Freq: Once | SUBCUTANEOUS | Status: AC
  Administered 2023-05-14: 6 mg via SUBCUTANEOUS
  Filled 2023-05-14: qty 0.6

## 2023-05-14 NOTE — Patient Instructions (Signed)

## 2023-05-20 ENCOUNTER — Encounter: Payer: Self-pay | Admitting: Student

## 2023-05-20 ENCOUNTER — Encounter: Payer: Self-pay | Admitting: Hematology & Oncology

## 2023-05-21 NOTE — Progress Notes (Signed)
[facility] - Clinic Note  06/03/2023     CHIEF COMPLAINT Patient presents for Retina Follow Up   HISTORY OF PRESENT ILLNESS: Amber Cross is a 62 y.o. female who presents to the clinic today for:  HPI     Retina Follow Up   Patient presents with  CRVO/BRVO.  In left eye.  This started 4 weeks ago.  I, the attending physician,  performed the HPI with the patient and updated documentation appropriately.        Comments   Patient here for 4 weeks retina follow up for BRVO OS. Patient states vision  about the same. No eye pain. Not using drops.       Last edited by Rennis Chris, MD on 06/03/2023 11:35 AM.      Referring physician: Alma Downs, PA-C  [facility] [address] [place],  [place] [address]  HISTORICAL INFORMATION:  Selected notes from the MEDICAL RECORD NUMBER Referred by Alma Downs, PA-C for concern of CME / PDR LEE:  Ocular Hx- PMH-   CURRENT MEDICATIONS: No current outpatient medications on file. (Ophthalmic Drugs)   No current facility-administered medications for this visit. (Ophthalmic Drugs)   Current Outpatient Medications (Other)  Medication Sig   acetaminophen (TYLENOL) 500 MG tablet Take 1,000 mg by mouth every 6 (six) hours as needed for mild pain.   albuterol (VENTOLIN HFA) 108 (90 Base) MCG/ACT inhaler Inhale 2 puffs into the lungs every 6 (six) hours as needed for wheezing or shortness of breath.   amLODipine (NORVASC) 10 MG tablet Take 1 tablet (10 mg total) by mouth daily.   amlodipine-olmesartan (AZOR) 10-20 MG tablet Take 1 tablet by mouth at bedtime.   aspirin EC 81 MG tablet Take 1 tablet (81 mg total) by mouth daily. Swallow whole.   atorvastatin (LIPITOR) 80 MG tablet Take 1 tablet (80 mg total) by mouth daily.   B Complex-C-Zn-Folic Acid (DIALYVITE 800 WITH ZINC) 0.8 MG TABS Take 1 tablet by mouth daily.   Blood Glucose Calibration (MYGLUCOHEALTH CONTROL) SOLN    calcium  acetate (PHOSLO) 667 MG tablet Take 667 mg by mouth 3 (three) times daily.   Calcium Acetate 668 (169 Ca) MG TABS Take 1 tablet by mouth 3 (three) times daily.   carvedilol (COREG) 12.5 MG tablet Take 12.5 mg by mouth.   Continuous Glucose Receiver (DEXCOM G7 RECEIVER) DEVI Use as directed   Continuous Glucose Sensor (DEXCOM G7 SENSOR) MISC Apply each device on your skin every 10 days   dexamethasone (DECADRON) 4 MG tablet Take 2 tabs by mouth starting day after last dose of etoposide for one day only. Repeat every 21 days. Take with food.   dronabinol (MARINOL) 2.5 MG capsule Take 1 capsule (2.5 mg total) by mouth 2 (two) times daily before a meal.   furosemide (LASIX) 40 MG tablet Take 1 tablet (40 mg total) by mouth daily as needed (swelling in legs or weight gain).   gabapentin (NEURONTIN) 600 MG tablet Take 600 mg by mouth daily.   glucose blood (PRECISION QID TEST) test strip    hydrALAZINE (APRESOLINE) 50 MG tablet Take 1 tablet (50 mg total) by mouth every 8 (eight) hours.   Insulin Pen Needle (UNIFINE PENTIPS) 32G X 4 MM MISC Use in the morning and at bedtime   Lancet Devices (ADJUSTABLE LANCING DEVICE) MISC    losartan (COZAAR) 25 MG tablet Take 1 tablet (25 mg total) by mouth daily.  megestrol (MEGACE ES) 625 MG/5ML suspension Take 5 mLs (625 mg total) by mouth daily.   megestrol (MEGACE) 400 MG/10ML suspension Take 10 mLs (400 mg total) by mouth 2 (two) times daily.   ondansetron (ZOFRAN) 8 MG tablet Take 1 tablet (8 mg total) by mouth every 8 (eight) hours as needed for nausea, vomiting or refractory nausea / vomiting. Start on the third day after carboplatin.   pantoprazole (PROTONIX) 40 MG tablet Take 40 mg by mouth daily.   prochlorperazine (COMPAZINE) 10 MG tablet Take 1 tablet (10 mg total) by mouth every 6 (six) hours as needed for nausea or vomiting.   sevelamer carbonate (RENVELA) 800 MG tablet Take 800 mg by mouth 3 (three) times daily with meals.   traZODone (DESYREL) 50  MG tablet Take 1 tablet (50 mg total) by mouth at bedtime.   Vitamin D, Ergocalciferol, (DRISDOL) 1.25 MG (50000 UNIT) CAPS capsule Take 1 capsule (50,000 Units total) by mouth every 7 (seven) days.   No current facility-administered medications for this visit. (Other)   REVIEW OF SYSTEMS: ROS   Positive for: Musculoskeletal, Endocrine, Cardiovascular, Eyes Negative for: Constitutional, Gastrointestinal, Neurological, Skin, Genitourinary, HENT, Respiratory, Psychiatric, Allergic/Imm, Heme/Lymph Last edited by Laddie Aquas, COA on 06/03/2023  8:54 AM.     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   Postop check 08/05/2022   Pseudogout    Stroke (HCC) 10/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   Tamponade 06/18/2022   Tobacco abuse    Past Surgical History:  Procedure Laterality Date   ANTERIOR CRUCIATE LIGAMENT REPAIR Right    AV FISTULA PLACEMENT Left 07/01/2022   Procedure: LEFT ARTERIOVENOUS (AV) FISTULA CREATION;  Surgeon: Chuck Hint, MD;  Location[facility] OR;  Service: Vascular;  Laterality: Left;  with regional block   BRONCHIAL BIOPSY  12/18/2021   Procedure: BRONCHIAL BIOPSIES;  Surgeon: Josephine Igo, DO;  Location: [facility] ENDOSCOPY;  Service: Pulmonary;;   BRONCHIAL BRUSHINGS  12/18/2021   Procedure: BRONCHIAL BRUSHINGS;  Surgeon: Josephine Igo, DO;  Location: [facility] ENDOSCOPY;  Service: Pulmonary;;   BRONCHIAL NEEDLE  ASPIRATION BIOPSY  12/18/2021   Procedure: BRONCHIAL NEEDLE ASPIRATION BIOPSIES;  Surgeon: Josephine Igo, DO;  Location: [facility] ENDOSCOPY;  Service: Pulmonary;;   CATARACT EXTRACTION     CHEST TUBE INSERTION Right 06/18/2022   Procedure: CHEST TUBE INSERTION;  Surgeon: Lovett Sox, MD;  Location: [facility] OR;  Service: Thoracic;  Laterality: Right;   EXCHANGE OF A DIALYSIS CATHETER N/A 08/08/2022   Procedure: EXCHANGE OF A TUNNELED DIALYSIS CATHETER;  Surgeon: Nada Libman, MD;  Location: [facility] OR;  Service: Vascular;  Laterality: N/A;   FIDUCIAL MARKER PLACEMENT  12/18/2021   Procedure: FIDUCIAL MARKER PLACEMENT;  Surgeon: Josephine Igo, DO;  Location: [facility] ENDOS[facility];  Service: Pulmonary;;   IR FLUORO GUIDE CV LINE RIGHT  06/26/2022   IR US GUIDE VASC ACCESS RIGHT  06/26/2022   OVARY SURGERY     RIGHT OOPHORECTOMY Right    SMALL INTESTINE SURGERY     SUBXYPHOID PERICARDIAL WINDOW N/A 06/18/2022   Procedure: SUBXYPHOID PERICARDIAL WINDOW;  Surgeon: Lovett Sox, MD;  Location: [facility] OR;  Service: Thoracic;  Laterality: N/A;   TEE WITHOUT CARDIOVERSION N/A 06/18/2022   Procedure: TRANSESOPHAGEAL ECHOCARDIOGRAM (TEE);  Surgeon: Lovett Sox, MD;  Location: [facility] OR;  Service: Thoracic;  Laterality: N/A;   TUBAL LIGATION     VIDEO BRONCHOSCOPY WITH RADIAL ENDOBRONCHIAL ULTRASOUND  12/18/2021   Procedure: RADIAL ENDOBRONCHIAL ULTRASOUND;  Surgeon: Josephine Igo, DO;  Location: [facility];  Service: Pulmonary;;   FAMILY HISTORY Family History  Problem Relation Age of Onset   Diabetes Mother    Hypertension Mother    Hypertension Father    SOCIAL HISTORY Social History   Tobacco Use   Smoking status: Every Day    Current packs/day: 0.50    Average packs/day: 0.5 packs/day for 40.0 years (20.0 ttl pk-yrs)    Types: Cigarettes   Smokeless tobacco: Never   Tobacco comments:    Cutting back 1/4-1/2 ppd  Vaping Use   Vaping status: Never Used  Substance Use Topics   Alcohol use: Not  Currently   Drug use: Yes    Frequency: 4.0 times per week    Types: Marijuana    Comment: smokes every other day      OPHTHALMIC EXAM:  Base Eye Exam     Visual Acuity (Snellen - Linear)       Right Left   Dist cc 20/30 20/150 -1   Dist ph cc 20/25 20/150 +1    Correction: Glasses         Tonometry (Tonopen, 8:52 AM)       Right Left   Pressure 13 12         Pupils       Dark Light Shape React APD   Right 3 2 Round Brisk None   Left 3 2 Round Brisk None         Visual Fields (Counting fingers)       Left Right    Full Full         Extraocular Movement       Right Left    Full, Ortho Full, Ortho         Neuro/Psych     Oriented x3: Yes   Mood/Affect: Normal         Dilation     Both eyes: 1.0% Mydriacyl, 2.5% Phenylephrine @ 8:52 AM           Slit Lamp and Fundus Exam     Slit Lamp Exam       Right Left   Lids/Lashes Dermatochalasis - upper lid, mild MGD Dermatochalasis - upper lid, mild MGD   Conjunctiva/Sclera nasal pingeucula, Melanosis nasal pingeucula, Melanosis   Cornea arcus, well healed cataract wound, nasal LRI, 2+ Punctate epithelial erosions arcus, 1+ Punctate epithelial erosions, trace tear film debris   Anterior Chamber deep and clear deep and clear   Iris Round and dilated Round and dilated   Lens PC IOL in good position 2+ Nuclear sclerosis with brunescence, 2-3+ Cortical cataract   Anterior Vitreous mild syneresis, Posterior vitreous detachment mild syneresis         Fundus Exam       Right Left   Disc Pink and Sharp, temporal pallor,  temporal PPP Pink and Sharp, temporal pallor, temporal PPP, no NVD, +SVP   C/D Ratio 0.3 0.3   Macula Good foveal reflex, scattered MA, DBH and punctate exudates -- improved, cystic changes -- slightly improved Blunted foveal reflex, dense central cluster of exudates -- improved, scattered MA, DBH, central edema -- slightly improved   Vessels attenuated, Tortuous, no NV  attenuated, Tortuous, no NV   Periphery Attached, scattered MA Attached, scattered MA           Refraction     Wearing Rx       Sphere Cylinder Axis   Right -2.50 +0.50 158   Left -3.50 +0.75 151           IMAGING AND PROCEDURES  Imaging and Procedures for 06/03/2023  OCT, Retina - OU - Both Eyes       Right Eye Quality was good. Central Foveal Thickness: 231. Progression has improved. Findings include normal foveal contour, no SRF, intraretinal hyper-reflective material, intraretinal fluid (persistent IRF / IRHM greatest ST mac -- improved).   Left Eye Quality was good. Central Foveal Thickness: 179. Progression has improved. Findings include no SRF, abnormal foveal contour, subretinal hyper-reflective material, intraretinal hyper-reflective material, intraretinal fluid, outer retinal atrophy (Interval improvement in IRF/IRHM greatest IT macula).   Notes *Images captured and stored on drive  Diagnosis / Impression:  DME OU OD: persistent IRF / IRHM greatest ST mac -- improved OS: Interval improvement in IRF/IRHM greatest IT macula  Clinical management:  See below  Abbreviations: NFP - Normal foveal profile. CME - cystoid macular edema. PED - pigment epithelial detachment. IRF - intraretinal fluid. SRF - subretinal fluid. EZ - ellipsoid zone. ERM - epiretinal membrane. ORA - outer retinal atrophy. ORT - outer retinal tubulation. SRHM - subretinal hyper-reflective material. IRHM - intraretinal hyper-reflective material      Intravitreal Injection, Pharmacologic Agent - OD - Right Eye       Time Out 06/03/2023. 9:31 AM. Confirmed correct patient, procedure, site, and patient consented.   Anesthesia Topical anesthesia was used. Anesthetic medications included Lidocaine 2%, Proparacaine 0.5%.   Procedure Preparation included 5% betadine to ocular surface, eyelid speculum. A (32g) needle was used.   Injection: 1.25 mg Bevacizumab 1.25mg /0.85ml   Route:  Intravitreal, Site: Right Eye   NDC: P3213405, Lot: [id number], Expiration date: 08/23/2023   Post-op Post injection exam found visual acuity of at least counting fingers. The patient tolerated the procedure well. There were no complications. The patient received written and verbal post procedure care education.      Intravitreal Injection, Pharmacologic Agent - OS - Left Eye       Time Out 06/03/2023. 9:32 AM. Confirmed correct patient, procedure, site, and patient consented.   Anesthesia Topical anesthesia was used. Anesthetic medications included Lidocaine 2%, Proparacaine 0.5%.   Procedure Preparation included 5% betadine to ocular surface, eyelid speculum. A (32g) needle was used.   Injection: 2 mg aflibercept 2 MG/0.05ML   Route: Intravitreal, Site: Left Eye   NDC: L6038910, Lot: [id number], Expiration date: 05/28/2024, Waste: 0 mL   Post-op Post injection exam found visual acuity of at least counting fingers. The patient tolerated the procedure well. There were no complications. The patient received written and verbal post procedure care education.           ASSESSMENT/PLAN:  1,2 Severe Non-proliferative diabetic retinopathy, both eyes  - A1C 7.8 (08.13.24), 10.4 (05.09.24), 6.7 (11.30.23) - s/p IVA OD #1 (05.02.24), #2 (05.30.24), #3 (07.24.24), #4 (08.21.24), #5 (  10.08.24) - s/p IVA OS #1 (04.04.24), #2 (05.02.24), #3 (05.30.24), #4 (07.24.24), #5 (08.21.24) - s/p IVE OS #1 (10.08.24) - BCVA OD 20/25 - stable, OS 20/150 -- stable - exam shows scattered MA/DBH and exudates OU (OS > OD) - FA (04.04.24) shows leaking MA greatest posteriorly, no NV OU - OCT shows OD: persistent IRF / IRHM greatest ST mac -- improved; OS: interval improvement in IRF/IRHM at 4 weeks - recommend IVA OD #6 and IVE OS #2 today, 11.05.24 for DME w/ f/u in 4 wks - pt wishes to proceed - RBA of procedure discussed, questions answered - Eylea approved with insurance - IVA informed  consent obtained and signed, 04.04.24 (OU) - IVE informed consent obtained and signed, 10.08.24 (OU) - see procedure note - f/u in 4 wks -- DFE/OCT, possible injection  3,4. Hypertensive retinopathy OU - discussed importance of tight BP control - monitor  5. Mixed Cataract OS - The symptoms of cataract, surgical options, and treatments and risks were discussed with patient. - discussed diagnosis and progression - under the expert management of [facility] - pt wishes to have cataract surgery this year in 2024 - clear from a retina standpoint to proceed with cataract surgery when pt and surgeon are ready  - has appointment with [facility] in December   6. Pseudophakia OD  - s/p CE/IOL OD (Dr. Dione Booze, 2017)  - IOL in good position, doing well  - monitor  Ophthalmic Meds Ordered this visit:  Meds ordered this encounter  Medications   aflibercept (EYLEA) SOLN 2 mg   Bevacizumab (AVASTIN) SOLN 1.25 mg    Return in about 4 weeks (around 07/01/2023) for f/u NPDR OU, DFE, OCT.  There are no Patient Instructions on file for this visit.  Explained the diagnoses, plan, and follow up with the patient and they expressed understanding.  Patient expressed understanding of the importance of proper follow up care.   This document serves as a record of services personally performed by Karie Chimera, MD, PhD. It was created on their behalf by Charlette Caffey, COT an ophthalmic technician. The creation of this record is the provider's dictation and/or activities during the visit.    Electronically signed by:  Charlette Caffey, COT  06/03/23 5:25 PM  This document serves as a record of services personally performed by Karie Chimera, MD, PhD. It was created on their behalf by Glee Arvin. Manson Passey, OA an ophthalmic technician. The creation of this record is the provider's dictation and/or activities during the visit.    Electronically signed by: Glee Arvin. Manson Passey, OA 06/03/23 5:25  PM  Karie Chimera, M.D., Ph.D. Diseases & Surgery of the Retina and Vitreous [facility]  I have reviewed the above documentation for accuracy and completeness, and I agree with the above. Karie Chimera, M.D., Ph.D. 06/03/23 5:26 PM   Abbreviations: M myopia (nearsighted); A astigmatism; H hyperopia (farsighted); P presbyopia; Mrx spectacle prescription;  CTL contact lenses; OD right eye; OS left eye; OU both eyes  XT exotropia; ET esotropia; PEK punctate epithelial keratitis; PEE punctate epithelial erosions; DES dry eye syndrome; MGD meibomian gland dysfunction; ATs artificial tears; PFAT's preservative free artificial tears; NSC nuclear sclerotic cataract; PSC posterior subcapsular cataract; ERM epi-retinal membrane; PVD posterior vitreous detachment; RD retinal detachment; DM diabetes mellitus; DR diabetic retinopathy; NPDR non-proliferative diabetic retinopathy; PDR proliferative diabetic retinopathy; CSME clinically significant macular edema; DME diabetic macular edema; dbh dot blot hemorrhages;  CWS cotton wool spot; POAG primary open angle glaucoma; C/D cup-to-disc ratio; HVF humphrey visual field; GVF goldmann visual field; OCT optical coherence tomography; IOP intraocular pressure; BRVO Branch retinal vein occlusion; CRVO central retinal vein occlusion; CRAO central retinal artery occlusion; BRAO branch retinal artery occlusion; RT retinal tear; SB scleral buckle; PPV pars plana vitrectomy; VH Vitreous hemorrhage; PRP panretinal laser photocoagulation; IVK intravitreal kenalog; VMT vitreomacular traction; MH Macular hole;  NVD neovascularization of the disc; NVE neovascularization elsewhere; AREDS age related eye disease study; ARMD age related macular degeneration; POAG primary open angle glaucoma; EBMD epithelial/anterior basement membrane dystrophy; ACIOL anterior chamber intraocular lens; IOL intraocular lens; PCIOL posterior chamber intraocular lens; Phaco/IOL  phacoemulsification with intraocular lens placement; PRK photorefractive keratectomy; LASIK laser assisted in situ keratomileusis; HTN hypertension; DM diabetes mellitus; COPD chronic obstructive pulmonary disease

## 2023-05-27 ENCOUNTER — Inpatient Hospital Stay: Payer: 59

## 2023-05-27 ENCOUNTER — Telehealth: Payer: Self-pay

## 2023-05-27 ENCOUNTER — Encounter: Payer: Self-pay | Admitting: Hematology & Oncology

## 2023-05-27 ENCOUNTER — Other Ambulatory Visit: Payer: Self-pay

## 2023-05-27 ENCOUNTER — Inpatient Hospital Stay (HOSPITAL_BASED_OUTPATIENT_CLINIC_OR_DEPARTMENT_OTHER): Payer: 59 | Admitting: Hematology & Oncology

## 2023-05-27 VITALS — BP 157/63 | HR 83 | Temp 97.7°F | Resp 20 | Ht 67.0 in | Wt 112.0 lb

## 2023-05-27 DIAGNOSIS — C3411 Malignant neoplasm of upper lobe, right bronchus or lung: Secondary | ICD-10-CM

## 2023-05-27 DIAGNOSIS — Z5111 Encounter for antineoplastic chemotherapy: Secondary | ICD-10-CM | POA: Diagnosis not present

## 2023-05-27 LAB — CMP (CANCER CENTER ONLY)
ALT: 14 U/L (ref 0–44)
AST: 11 U/L — ABNORMAL LOW (ref 15–41)
Albumin: 3.9 g/dL (ref 3.5–5.0)
Alkaline Phosphatase: 115 U/L (ref 38–126)
Anion gap: 13 (ref 5–15)
BUN: 27 mg/dL — ABNORMAL HIGH (ref 8–23)
CO2: 31 mmol/L (ref 22–32)
Calcium: 8.6 mg/dL — ABNORMAL LOW (ref 8.9–10.3)
Chloride: 96 mmol/L — ABNORMAL LOW (ref 98–111)
Creatinine: 5.34 mg/dL — ABNORMAL HIGH (ref 0.44–1.00)
GFR, Estimated: 9 mL/min — ABNORMAL LOW (ref >60)
Glucose, Bld: 186 mg/dL — ABNORMAL HIGH (ref 70–99)
Potassium: 3.9 mmol/L (ref 3.5–5.1)
Sodium: 140 mmol/L (ref 135–145)
Total Bilirubin: 0.2 mg/dL — ABNORMAL LOW (ref 0.3–1.2)
Total Protein: 6.6 g/dL (ref 6.5–8.1)

## 2023-05-27 LAB — CBC WITH DIFFERENTIAL (CANCER CENTER ONLY)
Abs Immature Granulocytes: 0.24 10*3/uL — ABNORMAL HIGH (ref 0.00–0.07)
Basophils Absolute: 0.1 10*3/uL (ref 0.0–0.1)
Basophils Relative: 0 %
Eosinophils Absolute: 0 10*3/uL (ref 0.0–0.5)
Eosinophils Relative: 0 %
HCT: 28.7 % — ABNORMAL LOW (ref 36.0–46.0)
Hemoglobin: 9.1 g/dL — ABNORMAL LOW (ref 12.0–15.0)
Immature Granulocytes: 2 %
Lymphocytes Relative: 11 %
Lymphs Abs: 1.8 10*3/uL (ref 0.7–4.0)
MCH: 29.1 pg (ref 26.0–34.0)
MCHC: 31.7 g/dL (ref 30.0–36.0)
MCV: 91.7 fL (ref 80.0–100.0)
Monocytes Absolute: 0.8 10*3/uL (ref 0.1–1.0)
Monocytes Relative: 5 %
Neutro Abs: 13 10*3/uL — ABNORMAL HIGH (ref 1.7–7.7)
Neutrophils Relative %: 82 %
Platelet Count: 173 10*3/uL (ref 150–400)
RBC: 3.13 MIL/uL — ABNORMAL LOW (ref 3.87–5.11)
RDW: 17.1 % — ABNORMAL HIGH (ref 11.5–15.5)
WBC Count: 15.9 10*3/uL — ABNORMAL HIGH (ref 4.0–10.5)
nRBC: 0 % (ref 0.0–0.2)

## 2023-05-27 LAB — SAMPLE TO BLOOD BANK

## 2023-05-27 MED ORDER — SODIUM CHLORIDE 0.9 % IV SOLN: 10.0000 mg | Freq: Once | INTRAVENOUS | Status: DC

## 2023-05-27 MED ORDER — HEPARIN SOD (PORK) LOCK FLUSH 100 UNIT/ML IV SOLN
500.0000 [IU] | Freq: Once | INTRAVENOUS | Status: DC | PRN
Start: 2023-05-27 — End: 2023-05-27

## 2023-05-27 MED ORDER — SODIUM CHLORIDE 0.9 % IV SOLN
168.5000 mg | Freq: Once | INTRAVENOUS | Status: AC
Start: 2023-05-27 — End: 2023-05-27
  Administered 2023-05-27: 170 mg via INTRAVENOUS
  Filled 2023-05-27: qty 17

## 2023-05-27 MED ORDER — SODIUM CHLORIDE 0.9 % IV SOLN
150.0000 mg | Freq: Once | INTRAVENOUS | Status: AC
  Administered 2023-05-27: 150 mg via INTRAVENOUS
  Filled 2023-05-27: qty 150

## 2023-05-27 MED ORDER — PALONOSETRON HCL INJECTION 0.25 MG/5ML
0.2500 mg | Freq: Once | INTRAVENOUS | Status: AC
Start: 2023-05-27 — End: 2023-05-27
  Administered 2023-05-27: 0.25 mg via INTRAVENOUS
  Filled 2023-05-27: qty 5

## 2023-05-27 MED ORDER — ETOPOSIDE CHEMO INJECTION 1 GM/50ML
80.0000 mg/m2 | Freq: Once | INTRAVENOUS | Status: AC
  Administered 2023-05-27: 124 mg via INTRAVENOUS
  Filled 2023-05-27: qty 6.2

## 2023-05-27 MED ORDER — SODIUM CHLORIDE 0.9% FLUSH: 10.0000 mL | INTRAVENOUS | Status: DC | PRN

## 2023-05-27 MED ORDER — DRONABINOL 2.5 MG PO CAPS: 2.5000 mg | ORAL_CAPSULE | Freq: Two times a day (BID) | ORAL | 0 refills | Status: DC

## 2023-05-27 MED ORDER — SODIUM CHLORIDE 0.9 % IV SOLN
1200.0000 mg | Freq: Once | INTRAVENOUS | Status: AC
  Administered 2023-05-27: 1200 mg via INTRAVENOUS
  Filled 2023-05-27: qty 20

## 2023-05-27 MED ORDER — DEXAMETHASONE SODIUM PHOSPHATE 10 MG/ML IJ SOLN
10.0000 mg | Freq: Once | INTRAMUSCULAR | Status: AC
  Administered 2023-05-27: 10 mg via INTRAVENOUS
  Filled 2023-05-27: qty 1

## 2023-05-27 MED ORDER — SODIUM CHLORIDE 0.9 % IV SOLN: Freq: Once | INTRAVENOUS | Status: AC

## 2023-05-27 NOTE — Telephone Encounter (Signed)
Notified Patient by voicemail of prior authorization approval for Dronabinol 2.5 mg Capsules. Medication is approved through 11/25/2023. No other needs or concerns noted at this time.

## 2023-05-27 NOTE — Progress Notes (Signed)
Hematology and Oncology Follow Up Visit  Amber Cross [id number] 1960/09/03 62 y.o. 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  We started her on chemotherapy.  She has had 1 cycle to date.  I think she tolerated this pretty well.  She is still having some trouble eating.  I am not sure exactly as to why.  However, we will try her on some Marinol to see if this may help.  I will try her on 2.5 mg p.o. twice daily.  She is still getting dialysis.  I think she is doing okay with the dialysis.  Her blood sugars are better today which is nice.  She is not complain of any pain.  She has a little bit of pruritus on the left lower leg.  She has had no issues with diarrhea.  I think that she is still smoking.  Hopefully, she will be able to cut back a little bit.  She has had no mouth sores.  There has been no obvious bleeding.  Overall, I would say performance status is probably ECOG 1.    Medications:  Current Outpatient Medications:    acetaminophen (TYLENOL) 500 MG tablet, Take 1,000 mg by mouth every 6 (six) hours as needed for mild pain., Disp: , Rfl:    albuterol (VENTOLIN HFA) 108 (90 Base) MCG/ACT inhaler, Inhale 2 puffs into the lungs every 6 (six) hours as needed for wheezing or shortness of breath., Disp: 6.7 g, Rfl: 2   amLODipine (NORVASC) 10 MG tablet, Take 1 tablet (10 mg total) by mouth daily., Disp: 90 tablet, Rfl: 3   amlodipine-olmesartan (AZOR) 10-20 MG tablet, Take 1 tablet by mouth at bedtime., Disp: , Rfl:    aspirin EC 81 MG tablet, Take 1 tablet (81 mg total) by mouth daily. Swallow whole., Disp: 30 tablet, Rfl: 11   atorvastatin (LIPITOR) 80 MG tablet, Take 1 tablet (80 mg total) by mouth daily., Disp:  90 tablet, Rfl: 3   B Complex-C-Zn-Folic Acid (DIALYVITE 800 WITH ZINC) 0.8 MG TABS, Take 1 tablet by mouth daily., Disp: , Rfl:    Blood Glucose Calibration (MYGLUCOHEALTH CONTROL) SOLN, , Disp: , Rfl:    calcium acetate (PHOSLO) 667 MG tablet, Take 667 mg by mouth 3 (three) times daily., Disp: , Rfl:    Calcium Acetate 668 (169 Ca) MG TABS, Take 1 tablet by mouth 3 (three) times daily., Disp: , Rfl:    carvedilol (COREG) 12.5 MG tablet, Take 12.5 mg by mouth., Disp: , Rfl:    Continuous Glucose Receiver (DEXCOM G7 RECEIVER) DEVI, Use as directed, Disp: 1 each, Rfl: 7   Continuous Glucose Sensor (DEXCOM G7 SENSOR) MISC, Apply each device on your skin every 10 days, Disp: 3 each, Rfl: 2   dexamethasone (DECADRON) 4 MG tablet, Take 2 tabs by mouth starting day after last dose of etoposide for one day only. Repeat every 21 days. Take with food., Disp: 8 tablet, Rfl: 0   furosemide (LASIX) 40 MG tablet, Take 1 tablet (40 mg total) by mouth daily as needed (swelling in legs or weight gain)., Disp: 30 tablet, Rfl: 1   gabapentin (NEURONTIN) 600 MG tablet, Take 600 mg by mouth daily., Disp: , Rfl:    glucose blood (PRECISION QID TEST) test strip, ,  Disp: , Rfl:    hydrALAZINE (APRESOLINE) 50 MG tablet, Take 1 tablet (50 mg total) by mouth every 8 (eight) hours., Disp: 180 tablet, Rfl: 3   Insulin Pen Needle (UNIFINE PENTIPS) 32G X 4 MM MISC, Use in the morning and at bedtime, Disp: 100 each, Rfl: 3   Lancet Devices (ADJUSTABLE LANCING DEVICE) MISC, , Disp: , Rfl:    losartan (COZAAR) 25 MG tablet, Take 1 tablet (25 mg total) by mouth daily., Disp: 30 tablet, Rfl: 11   megestrol (MEGACE ES) 625 MG/5ML suspension, Take 5 mLs (625 mg total) by mouth daily., Disp: 150 mL, Rfl: 4   megestrol (MEGACE) 400 MG/10ML suspension, Take 10 mLs (400 mg total) by mouth 2 (two) times daily., Disp: 480 mL, Rfl: 3   ondansetron (ZOFRAN) 8 MG tablet, Take 1 tablet (8 mg total) by mouth every 8 (eight) hours as needed for  nausea, vomiting or refractory nausea / vomiting. Start on the third day after carboplatin., Disp: 30 tablet, Rfl: 1   pantoprazole (PROTONIX) 40 MG tablet, Take 40 mg by mouth daily., Disp: , Rfl:    prochlorperazine (COMPAZINE) 10 MG tablet, Take 1 tablet (10 mg total) by mouth every 6 (six) hours as needed for nausea or vomiting., Disp: 30 tablet, Rfl: 1   sevelamer carbonate (RENVELA) 800 MG tablet, Take 800 mg by mouth 3 (three) times daily with meals., Disp: , Rfl:    traZODone (DESYREL) 50 MG tablet, Take 1 tablet (50 mg total) by mouth at bedtime., Disp: 90 tablet, Rfl: 1   Vitamin D, Ergocalciferol, (DRISDOL) 1.25 MG (50000 UNIT) CAPS capsule, Take 1 capsule (50,000 Units total) by mouth every 7 (seven) days., Disp: 5 capsule, Rfl: 0  Allergies:  Allergies  Allergen Reactions   Penicillins Itching and Other (See Comments)    redness   Strawberry (Diagnostic) Itching and Swelling   Tomato Itching and Swelling    Past Medical History, Surgical history, Social history, and Family History were reviewed and updated.  Review of Systems: Review of Systems  Constitutional:  Positive for appetite change and unexpected weight change.  HENT:  Negative.    Eyes: Negative.   Respiratory: Negative.    Cardiovascular: Negative.   Gastrointestinal: Negative.   Endocrine: Negative.   Genitourinary: Negative.    Musculoskeletal: Negative.   Skin: Negative.   Neurological: Negative.   Hematological: Negative.   Psychiatric/Behavioral: Negative.      Physical Exam:  height is 5\' 7"  (1.702 m) and weight is 112 lb (50.8 kg). Her oral temperature is 97.7 F (36.5 C). Her blood pressure is 157/63 (abnormal) and her pulse is 83. Her respiration is 20 and oxygen saturation is 100%.   Wt Readings from Last 3 Encounters:  05/27/23 112 lb (50.8 kg)  05/09/23 115 lb (52.2 kg)  05/08/23 115 lb 1.9 oz (52.2 kg)    Physical Exam Vitals reviewed.  HENT:     Head: Normocephalic and atraumatic.   Eyes:     Pupils: Pupils are equal, round, and reactive to light.  Cardiovascular:     Rate and Rhythm: Normal rate and regular rhythm.     Heart sounds: Normal heart sounds.  Pulmonary:     Effort: Pulmonary effort is normal.     Breath sounds: Normal breath sounds.  Abdominal:     General: Bowel sounds are normal.     Palpations: Abdomen is soft.  Musculoskeletal:        General: No tenderness or deformity. Normal  range of motion.     Cervical back: Normal range of motion.  Lymphadenopathy:     Cervical: No cervical adenopathy.  Skin:    General: Skin is warm and dry.     Findings: No erythema or rash.  Neurological:     Mental Status: She is alert and oriented to person, place, and time.  Psychiatric:        Behavior: Behavior normal.        Thought Content: Thought content normal.        Judgment: Judgment normal.      Lab Results  Component Value Date   WBC 15.9 (H) 05/27/2023   HGB 9.1 (L) 05/27/2023   HCT 28.7 (L) 05/27/2023   MCV 91.7 05/27/2023   PLT 173 05/27/2023     Chemistry      Component Value Date/Time   NA 140 05/27/2023 1023   NA 141 03/11/2023 1220   K 3.9 05/27/2023 1023   CL 96 (L) 05/27/2023 1023   CO2 31 05/27/2023 1023   BUN 27 (H) 05/27/2023 1023   BUN 26 03/11/2023 1220   CREATININE 5.34 (H) 05/27/2023 1023      Component Value Date/Time   CALCIUM 8.6 (L) 05/27/2023 1023   ALKPHOS 115 05/27/2023 1023   AST 11 (L) 05/27/2023 1023   ALT 14 05/27/2023 1023   BILITOT 0.2 (L) 05/27/2023 1023      Impression and Plan: Ms. Trevathan is a very nice 62 year old African-American female.  She has history of limited stage small cell lung cancer.  Unfortunate, because of her overall health status, she really could not have combined radiation therapy and chemotherapy.  Unfortunately, I am not sure as to why the PET scan was not done that I ordered back in September.  We clearly will have to have a PET scan done after this cycle of treatment.   Hopefully, we can get a good reading as to what is going on.  I would like to think that her weight will tell us how she is doing.  Her weight currently is holding steady.  I would like to plan to treat her with a second cycle today.  I will see about a PET scan in about 3 weeks.  Will then see her back and hopefully see how she is responding.   Josph Macho, MD 10/29/202411:51 AM

## 2023-05-27 NOTE — Progress Notes (Signed)
OK to treat with creat-5.34 per order of Dr. Myna Hidalgo.

## 2023-05-27 NOTE — Patient Instructions (Signed)
[facility] [facility]  Discharge Instructions: Thank you for choosing [facility] to provide your oncology and hematology care.   If you have a lab appointment with the Cancer Center, please go directly to the Cancer Center and check in at the registration area.  Wear comfortable clothing and clothing appropriate for easy access to any Portacath or PICC line.   We strive to give you quality time with your provider. You may need to reschedule your appointment if you arrive late (15 or more minutes).  Arriving late affects you and other patients whose appointments are after yours.  Also, if you miss three or more appointments without notifying the office, you may be dismissed from the clinic at the provider's discretion.      For prescription refill requests, have your pharmacy contact our office and allow 72 hours for refills to be completed.    Today you received the following chemotherapy and/or immunotherapy agents Tecentriq, Carboplatin and Etoposide      To help prevent nausea and vomiting after your treatment, we encourage you to take your nausea medication as directed.  BELOW ARE SYMPTOMS THAT SHOULD BE REPORTED IMMEDIATELY: *FEVER GREATER THAN 100.4 F (38 C) OR HIGHER *CHILLS OR SWEATING *NAUSEA AND VOMITING THAT IS NOT CONTROLLED WITH YOUR NAUSEA MEDICATION *UNUSUAL SHORTNESS OF BREATH *UNUSUAL BRUISING OR BLEEDING *URINARY PROBLEMS (pain or burning when urinating, or frequent urination) *BOWEL PROBLEMS (unusual diarrhea, constipation, pain near the anus) TENDERNESS IN MOUTH AND THROAT WITH OR WITHOUT PRESENCE OF ULCERS (sore throat, sores in mouth, or a toothache) UNUSUAL RASH, SWELLING OR PAIN  UNUSUAL VAGINAL DISCHARGE OR ITCHING   Items with * indicate a potential emergency and should be followed up as soon as possible or go to the Emergency Department if any problems should occur.  Please show the CHEMOTHERAPY ALERT CARD or  IMMUNOTHERAPY ALERT CARD at check-in to the Emergency Department and triage nurse. Should you have questions after your visit or need to cancel or reschedule your appointment, please contact [facility] [place]  [phone] and follow the prompts.  Office hours are 8:00 a.m. to 4:30 p.m. Monday - Friday. Please note that voicemails left after 4:00 p.m. may not be returned until the following business day.  We are closed weekends and major holidays. You have access to a nurse at all times for urgent questions. Please call the main number to the clinic [phone] and follow the prompts.  For any non-urgent questions, you may also contact your provider using MyChart. We now offer e-Visits for anyone 81 and older to request care online for non-urgent symptoms. For details visit [email or web address].   Also download the MyChart app! Go to the app store, search "MyChart", open the app, select [facility], and log in with your MyChart username and password.

## 2023-05-28 ENCOUNTER — Inpatient Hospital Stay: Payer: 59

## 2023-05-28 VITALS — BP 179/57 | HR 81 | Temp 97.8°F | Resp 19

## 2023-05-28 DIAGNOSIS — C3411 Malignant neoplasm of upper lobe, right bronchus or lung: Secondary | ICD-10-CM

## 2023-05-28 DIAGNOSIS — Z5111 Encounter for antineoplastic chemotherapy: Secondary | ICD-10-CM | POA: Diagnosis not present

## 2023-05-28 MED ORDER — SODIUM CHLORIDE 0.9 % IV SOLN: Freq: Once | INTRAVENOUS | Status: AC

## 2023-05-28 MED ORDER — ETOPOSIDE CHEMO INJECTION 1 GM/50ML
80.0000 mg/m2 | Freq: Once | INTRAVENOUS | Status: AC
  Administered 2023-05-28: 124 mg via INTRAVENOUS
  Filled 2023-05-28: qty 6.2

## 2023-05-28 MED ORDER — SODIUM CHLORIDE 0.9 % IV SOLN: 10.0000 mg | Freq: Once | INTRAVENOUS | Status: DC

## 2023-05-28 MED ORDER — DEXAMETHASONE SODIUM PHOSPHATE 10 MG/ML IJ SOLN
10.0000 mg | Freq: Once | INTRAMUSCULAR | Status: AC
  Administered 2023-05-28: 10 mg via INTRAVENOUS
  Filled 2023-05-28: qty 1

## 2023-05-28 NOTE — Patient Instructions (Signed)
Etoposide Injection What is this medication? ETOPOSIDE (e toe POE side) treats some types of cancer. It works by slowing down the growth of cancer cells. This medicine may be used for other purposes; ask your health care provider or pharmacist if you have questions. COMMON BRAND NAME(S): Etopophos, Toposar, VePesid What should I tell my care team before I take this medication? They need to know if you have any of these conditions: Infection Kidney disease Liver disease Low blood counts, such as low white cell, platelet, red cell counts An unusual or allergic reaction to etoposide, other medications, foods, dyes, or preservatives If you or your partner are pregnant or trying to get pregnant Breastfeeding How should I use this medication? This medication is injected into a vein. It is given by your care team in a hospital or clinic setting. Talk to your care team about the use of this medication in children. Special care may be needed. Overdosage: If you think you have taken too much of this medicine contact a poison control center or emergency room at once. NOTE: This medicine is only for you. Do not share this medicine with others. What if I miss a dose? Keep appointments for follow-up doses. It is important not to miss your dose. Call your care team if you are unable to keep an appointment. What may interact with this medication? Warfarin This list may not describe all possible interactions. Give your health care provider a list of all the medicines, herbs, non-prescription drugs, or dietary supplements you use. Also tell them if you smoke, drink alcohol, or use illegal drugs. Some items may interact with your medicine. What should I watch for while using this medication? Your condition will be monitored carefully while you are receiving this medication. This medication may make you feel generally unwell. This is not uncommon as chemotherapy can affect healthy cells as well as cancer  cells. Report any side effects. Continue your course of treatment even though you feel ill unless your care team tells you to stop. This medication can cause serious side effects. To reduce the risk, your care team may give you other medications to take before receiving this one. Be sure to follow the directions from your care team. This medication may increase your risk of getting an infection. Call your care team for advice if you get a fever, chills, sore throat, or other symptoms of a cold or flu. Do not treat yourself. Try to avoid being around people who are sick. This medication may increase your risk to bruise or bleed. Call your care team if you notice any unusual bleeding. Talk to your care team about your risk of cancer. You may be more at risk for certain types of cancers if you take this medication. Talk to your care team if you may be pregnant. Serious birth defects can occur if you take this medication during pregnancy and for 6 months after the last dose. You will need a negative pregnancy test before starting this medication. Contraception is recommended while taking this medication and for 6 months after the last dose. Your care team can help you find the option that works for you. If your partner can get pregnant, use a condom during sex while taking this medication and for 4 months after the last dose. Do not breastfeed while taking this medication. This medication may cause infertility. Talk to your care team if you are concerned about your fertility. What side effects may I notice from receiving this medication?  Side effects that you should report to your care team as soon as possible: Allergic reactions--skin rash, itching, hives, swelling of the face, lips, tongue, or throat Infection--fever, chills, cough, sore throat, wounds that don't heal, pain or trouble when passing urine, general feeling of discomfort or being unwell Low red blood cell level--unusual weakness or fatigue,  dizziness, headache, trouble breathing Unusual bruising or bleeding Side effects that usually do not require medical attention (report to your care team if they continue or are bothersome): Diarrhea Fatigue Hair loss Loss of appetite Nausea Vomiting This list may not describe all possible side effects. Call your doctor for medical advice about side effects. You may report side effects to FDA at 1-800-FDA-1088. Where should I keep my medication? This medication is given in a hospital or clinic. It will not be stored at home. NOTE: This sheet is a summary. It may not cover all possible information. If you have questions about this medicine, talk to your doctor, pharmacist, or health care provider.  2024 Elsevier/Gold Standard (2021-12-06 00:00:00)

## 2023-05-29 ENCOUNTER — Inpatient Hospital Stay: Payer: 59

## 2023-05-29 VITALS — BP 174/73 | HR 86 | Temp 97.9°F | Resp 17

## 2023-05-29 DIAGNOSIS — C3411 Malignant neoplasm of upper lobe, right bronchus or lung: Secondary | ICD-10-CM

## 2023-05-29 DIAGNOSIS — Z5111 Encounter for antineoplastic chemotherapy: Secondary | ICD-10-CM | POA: Diagnosis not present

## 2023-05-29 MED ORDER — SODIUM CHLORIDE 0.9 % IV SOLN: Freq: Once | INTRAVENOUS | Status: AC

## 2023-05-29 MED ORDER — DEXAMETHASONE SODIUM PHOSPHATE 10 MG/ML IJ SOLN
10.0000 mg | Freq: Once | INTRAMUSCULAR | Status: AC
  Administered 2023-05-29: 10 mg via INTRAVENOUS
  Filled 2023-05-29: qty 1

## 2023-05-29 MED ORDER — SODIUM CHLORIDE 0.9 % IV SOLN
10.0000 mg | Freq: Once | INTRAVENOUS | Status: DC
Start: 2023-05-29 — End: 2023-05-29

## 2023-05-29 MED ORDER — ETOPOSIDE CHEMO INJECTION 1 GM/50ML
80.0000 mg/m2 | Freq: Once | INTRAVENOUS | Status: AC
  Administered 2023-05-29: 124 mg via INTRAVENOUS
  Filled 2023-05-29: qty 6.2

## 2023-05-29 NOTE — Patient Instructions (Signed)
[facility]  Discharge Instructions: Thank you for choosing [facility] to provide your oncology and hematology care.   If you have a lab appointment with the [facility], please go directly to the [facility] and check in at the registration area.  Wear comfortable clothing and clothing appropriate for easy access to any Portacath or PICC line.   We strive to give you quality time with your provider. You may need to reschedule your appointment if you arrive late (15 or more minutes).  Arriving late affects you and other patients whose appointments are after yours.  Also, if you miss three or more appointments without notifying the office, you may be dismissed from the clinic at the provider's discretion.      For prescription refill requests, have your pharmacy contact our office and allow 72 hours for refills to be completed.    Today you received the following chemotherapy and/or immunotherapy agents Etoposide.   To help prevent nausea and vomiting after your treatment, we encourage you to take your nausea medication as directed.  BELOW ARE SYMPTOMS THAT SHOULD BE REPORTED IMMEDIATELY: *FEVER GREATER THAN 100.4 F (38 C) OR HIGHER *CHILLS OR SWEATING *NAUSEA AND VOMITING THAT IS NOT CONTROLLED WITH YOUR NAUSEA MEDICATION *UNUSUAL SHORTNESS OF BREATH *UNUSUAL BRUISING OR BLEEDING *URINARY PROBLEMS (pain or burning when urinating, or frequent urination) *BOWEL PROBLEMS (unusual diarrhea, constipation, pain near the anus) TENDERNESS IN MOUTH AND THROAT WITH OR WITHOUT PRESENCE OF ULCERS (sore throat, sores in mouth, or a toothache) UNUSUAL RASH, SWELLING OR PAIN  UNUSUAL VAGINAL DISCHARGE OR ITCHING   Items with * indicate a potential emergency and should be followed up as soon as possible or go to the Emergency Department if any problems should occur.  Please show the CHEMOTHERAPY ALERT CARD or IMMUNOTHERAPY ALERT CARD at check-in  to the Emergency Department and triage nurse. Should you have questions after your visit or need to cancel or reschedule your appointment, please contact [facility]  [phone] and follow the prompts.  Office hours are 8:00 a.m. to 4:30 p.m. Monday - Friday. Please note that voicemails left after 4:00 p.m. may not be returned until the following business day.  We are closed weekends and major holidays. You have access to a nurse at all times for urgent questions. Please call the main number to the clinic [phone] and follow the prompts.  For any non-urgent questions, you may also contact your provider using MyChart. We now offer e-Visits for anyone 45 and older to request care online for non-urgent symptoms. For details visit [email or web address].   Also download the MyChart app! Go to the app store, search "MyChart", open the app, select [facility], and log in with your MyChart username and password.

## 2023-05-30 ENCOUNTER — Inpatient Hospital Stay: Payer: 59 | Attending: Radiation Oncology

## 2023-05-30 ENCOUNTER — Inpatient Hospital Stay: Payer: 59

## 2023-05-30 ENCOUNTER — Other Ambulatory Visit: Payer: Self-pay

## 2023-05-30 VITALS — BP 175/68 | HR 88 | Temp 98.4°F | Resp 18

## 2023-05-30 DIAGNOSIS — Z5189 Encounter for other specified aftercare: Secondary | ICD-10-CM | POA: Diagnosis not present

## 2023-05-30 DIAGNOSIS — C3411 Malignant neoplasm of upper lobe, right bronchus or lung: Secondary | ICD-10-CM | POA: Insufficient documentation

## 2023-05-30 MED ORDER — PEGFILGRASTIM-CBQV 6 MG/0.6ML ~~LOC~~ SOSY
6.0000 mg | PREFILLED_SYRINGE | Freq: Once | SUBCUTANEOUS | Status: DC
Start: 2023-05-30 — End: 2023-05-30

## 2023-05-30 MED ORDER — PEGFILGRASTIM INJECTION 6 MG/0.6ML ~~LOC~~
6.0000 mg | PREFILLED_SYRINGE | Freq: Once | SUBCUTANEOUS | Status: AC
  Administered 2023-05-30: 6 mg via SUBCUTANEOUS
  Filled 2023-05-30: qty 0.6

## 2023-05-30 NOTE — Patient Instructions (Signed)

## 2023-06-03 ENCOUNTER — Encounter (INDEPENDENT_AMBULATORY_CARE_PROVIDER_SITE_OTHER): Payer: Self-pay | Admitting: Ophthalmology

## 2023-06-03 ENCOUNTER — Ambulatory Visit (INDEPENDENT_AMBULATORY_CARE_PROVIDER_SITE_OTHER): Payer: 59 | Admitting: Ophthalmology

## 2023-06-03 DIAGNOSIS — H25812 Combined forms of age-related cataract, left eye: Secondary | ICD-10-CM

## 2023-06-03 DIAGNOSIS — Z794 Long term (current) use of insulin: Secondary | ICD-10-CM | POA: Diagnosis not present

## 2023-06-03 DIAGNOSIS — H35033 Hypertensive retinopathy, bilateral: Secondary | ICD-10-CM

## 2023-06-03 DIAGNOSIS — Z961 Presence of intraocular lens: Secondary | ICD-10-CM

## 2023-06-03 DIAGNOSIS — I10 Essential (primary) hypertension: Secondary | ICD-10-CM | POA: Diagnosis not present

## 2023-06-03 DIAGNOSIS — E113413 Type 2 diabetes mellitus with severe nonproliferative diabetic retinopathy with macular edema, bilateral: Secondary | ICD-10-CM

## 2023-06-03 MED ORDER — BEVACIZUMAB CHEMO INJECTION 1.25MG/0.05ML SYRINGE FOR KALEIDOSCOPE
1.2500 mg | INTRAVITREAL | Status: AC | PRN
  Administered 2023-06-03: 1.25 mg via INTRAVITREAL

## 2023-06-03 MED ORDER — AFLIBERCEPT 2MG/0.05ML IZ SOLN FOR KALEIDOSCOPE
2.0000 mg | INTRAVITREAL | Status: AC | PRN
  Administered 2023-06-03: 2 mg via INTRAVITREAL

## 2023-06-05 ENCOUNTER — Other Ambulatory Visit: Payer: Self-pay

## 2023-06-12 ENCOUNTER — Encounter (HOSPITAL_COMMUNITY)
Admission: RE | Admit: 2023-06-12 | Discharge: 2023-06-12 | Disposition: A | Discharge status: Home / Self Care | Payer: 59 | Source: Ambulatory Visit | Attending: Hematology & Oncology | Admitting: Hematology & Oncology

## 2023-06-12 DIAGNOSIS — C3411 Malignant neoplasm of upper lobe, right bronchus or lung: Secondary | ICD-10-CM | POA: Diagnosis present

## 2023-06-12 LAB — GLUCOSE, CAPILLARY: Glucose-Capillary: 84 mg/dL (ref 70–99)

## 2023-06-12 MED ORDER — FLUDEOXYGLUCOSE F - 18 (FDG) INJECTION
5.5500 | Freq: Once | INTRAVENOUS | Status: AC
  Administered 2023-06-12: 5.55 via INTRAVENOUS

## 2023-06-13 ENCOUNTER — Encounter: Payer: Self-pay | Admitting: *Deleted

## 2023-06-17 NOTE — Progress Notes (Shared)
[facility] - Clinic Note  07/01/2023     CHIEF COMPLAINT Patient presents for No chief complaint on file.   HISTORY OF PRESENT ILLNESS: Amber Cross is a 62 y.o. female who presents to the clinic today for:     Referring physician: Alma Downs, PA-C  [address] [place],  [place] [address]  HISTORICAL INFORMATION:  Selected notes from the MEDICAL RECORD NUMBER Referred by Alma Downs, PA-C for concern of CME / PDR LEE:  Ocular Hx- PMH-   CURRENT MEDICATIONS: No current outpatient medications on file. (Ophthalmic Drugs)   No current facility-administered medications for this visit. (Ophthalmic Drugs)   Current Outpatient Medications (Other)  Medication Sig   acetaminophen (TYLENOL) 500 MG tablet Take 1,000 mg by mouth every 6 (six) hours as needed for mild pain.   albuterol (VENTOLIN HFA) 108 (90 Base) MCG/ACT inhaler Inhale 2 puffs into the lungs every 6 (six) hours as needed for wheezing or shortness of breath.   amLODipine (NORVASC) 10 MG tablet Take 1 tablet (10 mg total) by mouth daily.   amlodipine-olmesartan (AZOR) 10-20 MG tablet Take 1 tablet by mouth at bedtime.   aspirin EC 81 MG tablet Take 1 tablet (81 mg total) by mouth daily. Swallow whole.   atorvastatin (LIPITOR) 80 MG tablet Take 1 tablet (80 mg total) by mouth daily.   B Complex-C-Zn-Folic Acid (DIALYVITE 800 WITH ZINC) 0.8 MG TABS Take 1 tablet by mouth daily.   Blood Glucose Calibration (MYGLUCOHEALTH CONTROL) SOLN    calcium acetate (PHOSLO) 667 MG tablet Take 667 mg by mouth 3 (three) times daily.   Calcium Acetate 668 (169 Ca) MG TABS Take 1 tablet by mouth 3 (three) times daily.   carvedilol (COREG) 12.5 MG tablet Take 12.5 mg by mouth.   Continuous Glucose Receiver (DEXCOM G7 RECEIVER) DEVI Use as directed   Continuous Glucose Sensor (DEXCOM G7 SENSOR) MISC Apply each device on your skin every 10 days   dexamethasone (DECADRON) 4  MG tablet Take 2 tabs by mouth starting day after last dose of etoposide for one day only. Repeat every 21 days. Take with food.   dronabinol (MARINOL) 2.5 MG capsule Take 1 capsule (2.5 mg total) by mouth 2 (two) times daily before a meal.   furosemide (LASIX) 40 MG tablet Take 1 tablet (40 mg total) by mouth daily as needed (swelling in legs or weight gain).   gabapentin (NEURONTIN) 600 MG tablet Take 600 mg by mouth daily.   glucose blood (PRECISION QID TEST) test strip    hydrALAZINE (APRESOLINE) 50 MG tablet Take 1 tablet (50 mg total) by mouth every 8 (eight) hours.   Insulin Pen Needle (UNIFINE PENTIPS) 32G X 4 MM MISC Use in the morning and at bedtime   Lancet Devices (ADJUSTABLE LANCING DEVICE) MISC    losartan (COZAAR) 25 MG tablet Take 1 tablet (25 mg total) by mouth daily.   megestrol (MEGACE ES) 625 MG/5ML suspension Take 5 mLs (625 mg total) by mouth daily.   megestrol (MEGACE) 400 MG/10ML suspension Take 10 mLs (400 mg total) by mouth 2 (two) times daily.   ondansetron (ZOFRAN) 8 MG tablet Take 1 tablet (8 mg total) by mouth every 8 (eight) hours as needed for nausea, vomiting or refractory nausea / vomiting. Start on the third day after carboplatin.   pantoprazole (PROTONIX) 40 MG tablet Take 40 mg by mouth daily.   prochlorperazine (COMPAZINE) 10 MG tablet  Take 1 tablet (10 mg total) by mouth every 6 (six) hours as needed for nausea or vomiting.   sevelamer carbonate (RENVELA) 800 MG tablet Take 800 mg by mouth 3 (three) times daily with meals.   traZODone (DESYREL) 50 MG tablet Take 1 tablet (50 mg total) by mouth at bedtime.   Vitamin D, Ergocalciferol, (DRISDOL) 1.25 MG (50000 UNIT) CAPS capsule Take 1 capsule (50,000 Units total) by mouth every 7 (seven) days.   No current facility-administered medications for this visit. 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   Postop check 08/05/2022   Pseudogout    Stroke (HCC) 10/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   Tamponade 06/18/2022   Tobacco abuse    Past Surgical History:  Procedure Laterality Date   ANTERIOR CRUCIATE LIGAMENT REPAIR Right    AV FISTULA PLACEMENT Left 07/01/2022   Procedure: LEFT ARTERIOVENOUS (AV) FISTULA CREATION;  Surgeon: Chuck Hint, MD;  Location: [facility] OR;  Service: Vascular;  Laterality: Left;  with regional block   BRONCHIAL BIOPSY  12/18/2021   Procedure: BRONCHIAL BIOPSIES;  Surgeon: Josephine Igo, DO;  Location: [facility];  Service: Pulmonary;;   BRONCHIAL BRUSHINGS  12/18/2021   Procedure: BRONCHIAL BRUSHINGS;  Surgeon: Josephine Igo, DO;  Location: [facility];  Service: Pulmonary;;   BRONCHIAL NEEDLE ASPIRATION BIOPSY  12/18/2021   Procedure: BRONCHIAL NEEDLE ASPIRATION BIOPSIES;  Surgeon: Josephine Igo, DO;  Location: [facility];  Service: Pulmonary;;   CATARACT EXTRACTION     CHEST TUBE INSERTION Right 06/18/2022   Procedure: CHEST TUBE INSERTION;  Surgeon: Lovett Sox, MD;  Location: [facility] OR;  Service: Thoracic;  Laterality: Right;   EXCHANGE OF A DIALYSIS CATHETER N/A 08/08/2022   Procedure: EXCHANGE OF A TUNNELED DIALYSIS CATHETER;  Surgeon: Nada Libman, MD;  Location: [facility] OR;  Service: Vascular;  Laterality: N/A;   FIDUCIAL MARKER PLACEMENT  12/18/2021   Procedure: FIDUCIAL MARKER PLACEMENT;  Surgeon: Josephine Igo, DO;  Location: [facility];  Service:  Pulmonary;;   IR FLUORO GUIDE CV LINE RIGHT  06/26/2022   IR US GUIDE VASC ACCESS RIGHT  06/26/2022   OVARY SURGERY     RIGHT OOPHORECTOMY Right    SMALL INTESTINE SURGERY     SUBXYPHOID PERICARDIAL WINDOW N/A 06/18/2022   Procedure: SUBXYPHOID PERICARDIAL WINDOW;  Surgeon: Lovett Sox, MD;  Location: [facility] OR;  Service: Thoracic;  Laterality: N/A;   TEE WITHOUT CARDIOVERSION N/A 06/18/2022   Procedure: TRANSESOPHAGEAL ECHOCARDIOGRAM (TEE);  Surgeon: Lovett Sox, MD;  Location: [facility] OR;  Service: Thoracic;  Laterality: N/A;   TUBAL LIGATION     VIDEO BRONCHOSCOPY WITH RADIAL ENDOBRONCHIAL ULTRASOUND  12/18/2021   Procedure: RADIAL ENDOBRONCHIAL ULTRASOUND;  Surgeon: Josephine Igo, DO;  Location: [facility];  Service: Pulmonary;;   FAMILY HISTORY  Family History  Problem Relation Age of Onset   Diabetes Mother    Hypertension Mother    Hypertension Father    SOCIAL HISTORY Social History   Tobacco Use   Smoking status: Every Day    Current packs/day: 0.50    Average packs/day: 0.5 packs/day for 40.0 years (20.0 ttl pk-yrs)    Types: Cigarettes   Smokeless tobacco: Never   Tobacco comments:    Cutting back 1/4-1/2 ppd  Vaping Use   Vaping status: Never Used  Substance Use Topics   Alcohol use: Not Currently   Drug use: Yes    Frequency: 4.0 times per week    Types: Marijuana    Comment: smokes every other day      OPHTHALMIC EXAM:  Not recorded    IMAGING AND PROCEDURES  Imaging and Procedures for 07/01/2023         ASSESSMENT/PLAN:  1,2 Severe Non-proliferative diabetic retinopathy, both eyes  - A1C 7.8 (08.13.24), 10.4 (05.09.24), 6.7 (11.30.23) - s/p IVA OD #1 (05.02.24), #2 (05.30.24), #3 (07.24.24), #4 (08.21.24), #5 (10.08.24), #6 (11.05.24) - s/p IVA OS #1 (04.04.24), #2 (05.02.24), #3 (05.30.24), #4 (07.24.24), #5 (08.21.24) - s/p IVE OS #1 (10.08.24), #2 (11.05.24) - BCVA OD 20/25 - stable, OS 20/150 -- stable - exam shows scattered MA/DBH and  exudates OU (OS > OD) - FA (04.04.24) shows leaking MA greatest posteriorly, no NV OU - OCT shows OD: persistent IRF / IRHM greatest ST mac -- improved; OS: interval improvement in IRF/IRHM at 4 weeks - recommend IVA OD #7 and IVE OS #3 today, 12.03.24 for DME w/ f/u in 4 wks - pt wishes to proceed - RBA of procedure discussed, questions answered - Eylea approved with insurance - IVA informed consent obtained and signed, 04.04.24 (OU) - IVE informed consent obtained and signed, 10.08.24 (OU) - see procedure note - f/u in 4 wks -- DFE/OCT, possible injection  3,4. Hypertensive retinopathy OU - discussed importance of tight BP control - monitor  5. Mixed Cataract OS - The symptoms of cataract, surgical options, and treatments and risks were discussed with patient. - discussed diagnosis and progression - under the expert management of [facility] - pt wishes to have cataract surgery this year in 2024 - clear from a retina standpoint to proceed with cataract surgery when pt and surgeon are ready  - has appointment with [facility] in December   6. Pseudophakia OD  - s/p CE/IOL OD (Dr. Dione Booze, 2017)  - IOL in good position, doing well  - monitor  Ophthalmic Meds Ordered this visit:  No orders of the defined types were placed in this encounter.   No follow-ups on file.  There are no Patient Instructions on file for this visit.  Explained the diagnoses, plan, and follow up with the patient and they expressed understanding.  Patient expressed understanding of the importance of proper follow up care.   This document serves as a record of services personally performed by Karie Chimera, MD, PhD. It was created on their behalf by Charlette Caffey, COT an ophthalmic technician. The creation of this record is the provider's dictation and/or activities during the visit.    Electronically signed by:  Charlette Caffey, COT  06/17/23 12:08 PM    Karie Chimera, M.D.,  Ph.D. Diseases & Surgery of the Retina and Vitreous [facility]     Abbreviations: M myopia (nearsighted); A astigmatism; H hyperopia (farsighted); P  presbyopia; Mrx spectacle prescription;  CTL contact lenses; OD right eye; OS left eye; OU both eyes  XT exotropia; ET esotropia; PEK punctate epithelial keratitis; PEE punctate epithelial erosions; DES dry eye syndrome; MGD meibomian gland dysfunction; ATs artificial tears; PFAT's preservative free artificial tears; NSC nuclear sclerotic cataract; PSC posterior subcapsular cataract; ERM epi-retinal membrane; PVD posterior vitreous detachment; RD retinal detachment; DM diabetes mellitus; DR diabetic retinopathy; NPDR non-proliferative diabetic retinopathy; PDR proliferative diabetic retinopathy; CSME clinically significant macular edema; DME diabetic macular edema; dbh dot blot hemorrhages; CWS cotton wool spot; POAG primary open angle glaucoma; C/D cup-to-disc ratio; HVF humphrey visual field; GVF goldmann visual field; OCT optical coherence tomography; IOP intraocular pressure; BRVO Branch retinal vein occlusion; CRVO central retinal vein occlusion; CRAO central retinal artery occlusion; BRAO branch retinal artery occlusion; RT retinal tear; SB scleral buckle; PPV pars plana vitrectomy; VH Vitreous hemorrhage; PRP panretinal laser photocoagulation; IVK intravitreal kenalog; VMT vitreomacular traction; MH Macular hole;  NVD neovascularization of the disc; NVE neovascularization elsewhere; AREDS age related eye disease study; ARMD age related macular degeneration; POAG primary open angle glaucoma; EBMD epithelial/anterior basement membrane dystrophy; ACIOL anterior chamber intraocular lens; IOL intraocular lens; PCIOL posterior chamber intraocular lens; Phaco/IOL phacoemulsification with intraocular lens placement; PRK photorefractive keratectomy; LASIK laser assisted in situ keratomileusis; HTN hypertension; DM diabetes mellitus; COPD  chronic obstructive pulmonary disease

## 2023-07-01 ENCOUNTER — Encounter (INDEPENDENT_AMBULATORY_CARE_PROVIDER_SITE_OTHER): Payer: 59 | Admitting: Ophthalmology

## 2023-07-01 DIAGNOSIS — I10 Essential (primary) hypertension: Secondary | ICD-10-CM

## 2023-07-01 DIAGNOSIS — H25812 Combined forms of age-related cataract, left eye: Secondary | ICD-10-CM

## 2023-07-01 DIAGNOSIS — H35033 Hypertensive retinopathy, bilateral: Secondary | ICD-10-CM

## 2023-07-01 DIAGNOSIS — Z794 Long term (current) use of insulin: Secondary | ICD-10-CM

## 2023-07-01 DIAGNOSIS — E113413 Type 2 diabetes mellitus with severe nonproliferative diabetic retinopathy with macular edema, bilateral: Secondary | ICD-10-CM

## 2023-07-01 DIAGNOSIS — Z961 Presence of intraocular lens: Secondary | ICD-10-CM

## 2023-07-01 NOTE — Progress Notes (Shared)
[facility] - Clinic Note  07/02/2023     CHIEF COMPLAINT Patient presents for No chief complaint on file.   HISTORY OF PRESENT ILLNESS: Amber Cross is a 62 y.o. female who presents to the clinic today for:     Referring physician: Alma Downs, PA-C  [address] [place],  [place] [address]  HISTORICAL INFORMATION:  Selected notes from the MEDICAL RECORD NUMBER Referred by Alma Downs, PA-C for concern of CME / PDR LEE:  Ocular Hx- PMH-   CURRENT MEDICATIONS: No current outpatient medications on file. (Ophthalmic Drugs)   No current facility-administered medications for this visit. (Ophthalmic Drugs)   Current Outpatient Medications (Other)  Medication Sig   acetaminophen (TYLENOL) 500 MG tablet Take 1,000 mg by mouth every 6 (six) hours as needed for mild pain.   albuterol (VENTOLIN HFA) 108 (90 Base) MCG/ACT inhaler Inhale 2 puffs into the lungs every 6 (six) hours as needed for wheezing or shortness of breath.   amLODipine (NORVASC) 10 MG tablet Take 1 tablet (10 mg total) by mouth daily.   amlodipine-olmesartan (AZOR) 10-20 MG tablet Take 1 tablet by mouth at bedtime.   aspirin EC 81 MG tablet Take 1 tablet (81 mg total) by mouth daily. Swallow whole.   atorvastatin (LIPITOR) 80 MG tablet Take 1 tablet (80 mg total) by mouth daily.   B Complex-C-Zn-Folic Acid (DIALYVITE 800 WITH ZINC) 0.8 MG TABS Take 1 tablet by mouth daily.   Blood Glucose Calibration (MYGLUCOHEALTH CONTROL) SOLN    calcium acetate (PHOSLO) 667 MG tablet Take 667 mg by mouth 3 (three) times daily.   Calcium Acetate 668 (169 Ca) MG TABS Take 1 tablet by mouth 3 (three) times daily.   carvedilol (COREG) 12.5 MG tablet Take 12.5 mg by mouth.   Continuous Glucose Receiver (DEXCOM G7 RECEIVER) DEVI Use as directed   Continuous Glucose Sensor (DEXCOM G7 SENSOR) MISC Apply each device on your skin every 10 days   dexamethasone (DECADRON) 4  MG tablet Take 2 tabs by mouth starting day after last dose of etoposide for one day only. Repeat every 21 days. Take with food.   dronabinol (MARINOL) 2.5 MG capsule Take 1 capsule (2.5 mg total) by mouth 2 (two) times daily before a meal.   furosemide (LASIX) 40 MG tablet Take 1 tablet (40 mg total) by mouth daily as needed (swelling in legs or weight gain).   gabapentin (NEURONTIN) 600 MG tablet Take 600 mg by mouth daily.   glucose blood (PRECISION QID TEST) test strip    hydrALAZINE (APRESOLINE) 50 MG tablet Take 1 tablet (50 mg total) by mouth every 8 (eight) hours.   Insulin Pen Needle (UNIFINE PENTIPS) 32G X 4 MM MISC Use in the morning and at bedtime   Lancet Devices (ADJUSTABLE LANCING DEVICE) MISC    losartan (COZAAR) 25 MG tablet Take 1 tablet (25 mg total) by mouth daily.   megestrol (MEGACE ES) 625 MG/5ML suspension Take 5 mLs (625 mg total) by mouth daily.   megestrol (MEGACE) 400 MG/10ML suspension Take 10 mLs (400 mg total) by mouth 2 (two) times daily.   ondansetron (ZOFRAN) 8 MG tablet Take 1 tablet (8 mg total) by mouth every 8 (eight) hours as needed for nausea, vomiting or refractory nausea / vomiting. Start on the third day after carboplatin.   pantoprazole (PROTONIX) 40 MG tablet Take 40 mg by mouth daily.   prochlorperazine (COMPAZINE) 10 MG tablet  Take 1 tablet (10 mg total) by mouth every 6 (six) hours as needed for nausea or vomiting.   sevelamer carbonate (RENVELA) 800 MG tablet Take 800 mg by mouth 3 (three) times daily with meals.   traZODone (DESYREL) 50 MG tablet Take 1 tablet (50 mg total) by mouth at bedtime.   Vitamin D, Ergocalciferol, (DRISDOL) 1.25 MG (50000 UNIT) CAPS capsule Take 1 capsule (50,000 Units total) by mouth every 7 (seven) days.   No current facility-administered medications for this visit. 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   Postop check 08/05/2022   Pseudogout    Stroke (HCC) 10/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   Tamponade 06/18/2022   Tobacco abuse    Past Surgical History:  Procedure Laterality Date   ANTERIOR CRUCIATE LIGAMENT REPAIR Right    AV FISTULA PLACEMENT Left 07/01/2022   Procedure: LEFT ARTERIOVENOUS (AV) FISTULA CREATION;  Surgeon: Chuck Hint, MD;  Location: [facility] OR;  Service: Vascular;  Laterality: Left;  with regional block   BRONCHIAL BIOPSY  12/18/2021   Procedure: BRONCHIAL BIOPSIES;  Surgeon: Josephine Igo, DO;  Location: [facility];  Service: Pulmonary;;   BRONCHIAL BRUSHINGS  12/18/2021   Procedure: BRONCHIAL BRUSHINGS;  Surgeon: Josephine Igo, DO;  Location: [facility];  Service: Pulmonary;;   BRONCHIAL NEEDLE ASPIRATION BIOPSY  12/18/2021   Procedure: BRONCHIAL NEEDLE ASPIRATION BIOPSIES;  Surgeon: Josephine Igo, DO;  Location: [facility];  Service: Pulmonary;;   CATARACT EXTRACTION     CHEST TUBE INSERTION Right 06/18/2022   Procedure: CHEST TUBE INSERTION;  Surgeon: Lovett Sox, MD;  Location: [facility] OR;  Service: Thoracic;  Laterality: Right;   EXCHANGE OF A DIALYSIS CATHETER N/A 08/08/2022   Procedure: EXCHANGE OF A TUNNELED DIALYSIS CATHETER;  Surgeon: Nada Libman, MD;  Location: [facility] OR;  Service: Vascular;  Laterality: N/A;   FIDUCIAL MARKER PLACEMENT  12/18/2021   Procedure: FIDUCIAL MARKER PLACEMENT;  Surgeon: Josephine Igo, DO;  Location: [facility];  Service:  Pulmonary;;   IR FLUORO GUIDE CV LINE RIGHT  06/26/2022   IR US GUIDE VASC ACCESS RIGHT  06/26/2022   OVARY SURGERY     RIGHT OOPHORECTOMY Right    SMALL INTESTINE SURGERY     SUBXYPHOID PERICARDIAL WINDOW N/A 06/18/2022   Procedure: SUBXYPHOID PERICARDIAL WINDOW;  Surgeon: Lovett Sox, MD;  Location: [facility] OR;  Service: Thoracic;  Laterality: N/A;   TEE WITHOUT CARDIOVERSION N/A 06/18/2022   Procedure: TRANSESOPHAGEAL ECHOCARDIOGRAM (TEE);  Surgeon: Lovett Sox, MD;  Location: [facility] OR;  Service: Thoracic;  Laterality: N/A;   TUBAL LIGATION     VIDEO BRONCHOSCOPY WITH RADIAL ENDOBRONCHIAL ULTRASOUND  12/18/2021   Procedure: RADIAL ENDOBRONCHIAL ULTRASOUND;  Surgeon: Josephine Igo, DO;  Location: [facility];  Service: Pulmonary;;   FAMILY HISTORY  Family History  Problem Relation Age of Onset   Diabetes Mother    Hypertension Mother    Hypertension Father    SOCIAL HISTORY Social History   Tobacco Use   Smoking status: Every Day    Current packs/day: 0.50    Average packs/day: 0.5 packs/day for 40.0 years (20.0 ttl pk-yrs)    Types: Cigarettes   Smokeless tobacco: Never   Tobacco comments:    Cutting back 1/4-1/2 ppd  Vaping Use   Vaping status: Never Used  Substance Use Topics   Alcohol use: Not Currently   Drug use: Yes    Frequency: 4.0 times per week    Types: Marijuana    Comment: smokes every other day      OPHTHALMIC EXAM:  Not recorded    IMAGING AND PROCEDURES  Imaging and Procedures for 07/02/2023         ASSESSMENT/PLAN:  1,2 Severe Non-proliferative diabetic retinopathy, both eyes  - A1C 7.8 (08.13.24), 10.4 (05.09.24), 6.7 (11.30.23) - s/p IVA OD #1 (05.02.24), #2 (05.30.24), #3 (07.24.24), #4 (08.21.24), #5 (10.08.24), #6 (11.05.24) - s/p IVA OS #1 (04.04.24), #2 (05.02.24), #3 (05.30.24), #4 (07.24.24), #5 (08.21.24) - s/p IVE OS #1 (10.08.24), #2 (11.05.24) - BCVA OD 20/25 - stable, OS 20/150 -- stable - exam shows scattered MA/DBH and  exudates OU (OS > OD) - FA (04.04.24) shows leaking MA greatest posteriorly, no NV OU - OCT shows OD: persistent IRF / IRHM greatest ST mac -- improved; OS: interval improvement in IRF/IRHM at 4 weeks - recommend IVA OD #7 and IVE OS #3 today, 12.04.24 for DME w/ f/u in 4 wks - pt wishes to proceed - RBA of procedure discussed, questions answered - Eylea approved with insurance - IVA informed consent obtained and signed, 04.04.24 (OU) - IVE informed consent obtained and signed, 10.08.24 (OU) - see procedure note - f/u in 4 wks -- DFE/OCT, possible injection  3,4. Hypertensive retinopathy OU - discussed importance of tight BP control - monitor  5. Mixed Cataract OS - The symptoms of cataract, surgical options, and treatments and risks were discussed with patient. - discussed diagnosis and progression - under the expert management of [facility] - pt wishes to have cataract surgery this year in 2024 - clear from a retina standpoint to proceed with cataract surgery when pt and surgeon are ready  - has appointment with [facility] in December   6. Pseudophakia OD  - s/p CE/IOL OD (Dr. Dione Booze, 2017)  - IOL in good position, doing well  - monitor  Ophthalmic Meds Ordered this visit:  No orders of the defined types were placed in this encounter.   No follow-ups on file.  There are no Patient Instructions on file for this visit.  Explained the diagnoses, plan, and follow up with the patient and they expressed understanding.  Patient expressed understanding of the importance of proper follow up care.   This document serves as a record of services personally performed by Karie Chimera, MD, PhD. It was created on their behalf by De Blanch, an ophthalmic technician. The creation of this record is the provider's dictation and/or activities during the visit.    Electronically signed by: De Blanch, OA, 07/01/23  8:33 AM     Karie Chimera, M.D., Ph.D. Diseases &  Surgery of the Retina and Vitreous [facility]     Abbreviations: M myopia (nearsighted); A astigmatism; H hyperopia (farsighted); P presbyopia; Mrx spectacle  prescription;  CTL contact lenses; OD right eye; OS left eye; OU both eyes  XT exotropia; ET esotropia; PEK punctate epithelial keratitis; PEE punctate epithelial erosions; DES dry eye syndrome; MGD meibomian gland dysfunction; ATs artificial tears; PFAT's preservative free artificial tears; NSC nuclear sclerotic cataract; PSC posterior subcapsular cataract; ERM epi-retinal membrane; PVD posterior vitreous detachment; RD retinal detachment; DM diabetes mellitus; DR diabetic retinopathy; NPDR non-proliferative diabetic retinopathy; PDR proliferative diabetic retinopathy; CSME clinically significant macular edema; DME diabetic macular edema; dbh dot blot hemorrhages; CWS cotton wool spot; POAG primary open angle glaucoma; C/D cup-to-disc ratio; HVF humphrey visual field; GVF goldmann visual field; OCT optical coherence tomography; IOP intraocular pressure; BRVO Branch retinal vein occlusion; CRVO central retinal vein occlusion; CRAO central retinal artery occlusion; BRAO branch retinal artery occlusion; RT retinal tear; SB scleral buckle; PPV pars plana vitrectomy; VH Vitreous hemorrhage; PRP panretinal laser photocoagulation; IVK intravitreal kenalog; VMT vitreomacular traction; MH Macular hole;  NVD neovascularization of the disc; NVE neovascularization elsewhere; AREDS age related eye disease study; ARMD age related macular degeneration; POAG primary open angle glaucoma; EBMD epithelial/anterior basement membrane dystrophy; ACIOL anterior chamber intraocular lens; IOL intraocular lens; PCIOL posterior chamber intraocular lens; Phaco/IOL phacoemulsification with intraocular lens placement; PRK photorefractive keratectomy; LASIK laser assisted in situ keratomileusis; HTN hypertension; DM diabetes mellitus; COPD chronic obstructive  pulmonary disease

## 2023-07-02 ENCOUNTER — Encounter (INDEPENDENT_AMBULATORY_CARE_PROVIDER_SITE_OTHER): Payer: 59 | Admitting: Ophthalmology

## 2023-07-02 ENCOUNTER — Other Ambulatory Visit: Payer: Self-pay

## 2023-07-02 DIAGNOSIS — E113413 Type 2 diabetes mellitus with severe nonproliferative diabetic retinopathy with macular edema, bilateral: Secondary | ICD-10-CM

## 2023-07-02 DIAGNOSIS — I10 Essential (primary) hypertension: Secondary | ICD-10-CM

## 2023-07-02 DIAGNOSIS — H25812 Combined forms of age-related cataract, left eye: Secondary | ICD-10-CM

## 2023-07-02 DIAGNOSIS — Z794 Long term (current) use of insulin: Secondary | ICD-10-CM

## 2023-07-02 DIAGNOSIS — H35033 Hypertensive retinopathy, bilateral: Secondary | ICD-10-CM

## 2023-07-02 DIAGNOSIS — Z961 Presence of intraocular lens: Secondary | ICD-10-CM

## 2023-07-07 ENCOUNTER — Telehealth: Payer: Self-pay | Admitting: *Deleted

## 2023-07-07 NOTE — Telephone Encounter (Signed)
Fax received from [facility] with pt.'s most recent labs which indicate pt.'s last HGB on 11/25 as 7.1, and a note stating that pt is having intermittent hemoptysis w/clots and fatigue.  Dr. Myna Hidalgo notified.  Call placed to patient and patient notified that Dr Myna Hidalgo would like for her to come in tomorrow, 07/08/23 for labs and to see him.  Pt states that she can be here at 0830 tomorrow. Pt denies any SOB and states that hemoptysis is intermittent in small amounts.  She denies the need to go to the ER now and states that she will come to this office in the morning as planned.

## 2023-07-07 NOTE — Progress Notes (Signed)
[facility] - Clinic Note  07/08/2023     CHIEF COMPLAINT Patient presents for Retina Follow Up   HISTORY OF PRESENT ILLNESS: Amber Cross is a 62 y.o. female who presents to the clinic today for:  HPI     Retina Follow Up   Patient presents with  Diabetic Retinopathy.  In both eyes.  Severity is moderate.  Duration of 4 weeks.  Since onset it is stable.  I, the attending physician,  performed the HPI with the patient and updated documentation appropriately.        Comments   Pt here for 4 wk ret f/u NPDR OU. Pt states VA seems to be worse in OS due to cataract. Pt is scheduled for cataract consult on 07/24/23 w/ Dr. Dione Booze.       Last edited by Rennis Chris, MD on 07/08/2023  8:00 PM.    Pt is delayed from 4 weeks to 5 weeks due to transportation issues, she feels like the medicine has worn off   Referring physician: Alma Downs, PA-C  [address] [address] [place],  [place] [address]  HISTORICAL INFORMATION:  Selected notes from the MEDICAL RECORD NUMBER Referred by Alma Downs, PA-C for concern of CME / PDR LEE:  Ocular Hx- PMH-   CURRENT MEDICATIONS: No current outpatient medications on file. (Ophthalmic Drugs)   No current facility-administered medications for this visit. (Ophthalmic Drugs)   Current Outpatient Medications (Other)  Medication Sig   acetaminophen (TYLENOL) 500 MG tablet Take 1,000 mg by mouth every 6 (six) hours as needed for mild pain.   albuterol (VENTOLIN HFA) 108 (90 Base) MCG/ACT inhaler Inhale 2 puffs into the lungs every 6 (six) hours as needed for wheezing or shortness of breath.   amLODipine (NORVASC) 10 MG tablet Take 1 tablet (10 mg total) by mouth daily.   amlodipine-olmesartan (AZOR) 10-20 MG tablet Take 1 tablet by mouth at bedtime.   aspirin EC 81 MG tablet Take 1 tablet (81 mg total) by mouth daily. Swallow whole.   atorvastatin (LIPITOR) 80 MG tablet Take 1 tablet (80 mg  total) by mouth daily.   B Complex-C-Zn-Folic Acid (DIALYVITE 800 WITH ZINC) 0.8 MG TABS Take 1 tablet by mouth daily.   Blood Glucose Calibration (MYGLUCOHEALTH CONTROL) SOLN    calcium acetate (PHOSLO) 667 MG tablet Take 667 mg by mouth 3 (three) times daily.   Calcium Acetate 668 (169 Ca) MG TABS Take 1 tablet by mouth 3 (three) times daily.   carvedilol (COREG) 12.5 MG tablet Take 12.5 mg by mouth.   Continuous Glucose Sensor (DEXCOM G7 SENSOR) MISC Apply each device on your skin every 10 days   dexamethasone (DECADRON) 4 MG tablet Take 2 tabs by mouth starting day after last dose of etoposide for one day only. Repeat every 21 days. Take with food.   dronabinol (MARINOL) 2.5 MG capsule Take 1 capsule (2.5 mg total) by mouth 2 (two) times daily before a meal.   furosemide (LASIX) 40 MG tablet Take 1 tablet (40 mg total) by mouth daily as needed (swelling in legs or weight gain).   gabapentin (NEURONTIN) 600 MG tablet Take 600 mg by mouth daily.   glucose blood (PRECISION QID TEST) test strip    hydrALAZINE (APRESOLINE) 50 MG tablet Take 1 tablet (50 mg total) by mouth every 8 (eight) hours.   Insulin Pen Needle (UNIFINE PENTIPS) 32G X 4 MM MISC Use in the morning and  at bedtime   Lancet Devices (ADJUSTABLE LANCING DEVICE) MISC    losartan (COZAAR) 25 MG tablet Take 1 tablet (25 mg total) by mouth daily.   megestrol (MEGACE ES) 625 MG/5ML suspension Take 5 mLs (625 mg total) by mouth daily.   megestrol (MEGACE) 400 MG/10ML suspension Take 10 mLs (400 mg total) by mouth 2 (two) times daily.   ondansetron (ZOFRAN) 8 MG tablet Take 1 tablet (8 mg total) by mouth every 8 (eight) hours as needed for nausea, vomiting or refractory nausea / vomiting. Start on the third day after carboplatin.   pantoprazole (PROTONIX) 40 MG tablet Take 40 mg by mouth daily.   prochlorperazine (COMPAZINE) 10 MG tablet Take 1 tablet (10 mg total) by mouth every 6 (six) hours as needed for nausea or vomiting.   sevelamer  carbonate (RENVELA) 800 MG tablet Take 800 mg by mouth 3 (three) times daily with meals.   traZODone (DESYREL) 50 MG tablet Take 1 tablet (50 mg total) by mouth at bedtime.   Vitamin D, Ergocalciferol, (DRISDOL) 1.25 MG (50000 UNIT) CAPS capsule Take 1 capsule (50,000 Units total) by mouth every 7 (seven) days.   No current facility-administered medications for this visit. (Other)   REVIEW OF SYSTEMS: ROS   Positive for: Musculoskeletal, Endocrine, Cardiovascular, Eyes Negative for: Constitutional, Gastrointestinal, Neurological, Skin, Genitourinary, HENT, Respiratory, Psychiatric, Allergic/Imm, Heme/Lymph Last edited by Thompson Grayer, COT on 07/08/2023 12:42 PM.      ALLERGIES Allergies  Allergen Reactions   Penicillins Itching and Other (See Comments)    redness   Strawberry (Diagnostic) Itching and Swelling   Tomato Itching and Swelling   PAST MEDICAL HISTORY 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   Postop check 08/05/2022   Pseudogout    Stroke (HCC) 10/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   Tamponade 06/18/2022   Tobacco abuse    Past Surgical History:  Procedure Laterality Date   ANTERIOR CRUCIATE LIGAMENT REPAIR Right    AV FISTULA PLACEMENT Left 07/01/2022   Procedure: LEFT ARTERIOVENOUS (AV) FISTULA CREATION;  Surgeon: Chuck Hint, MD;  Location: [facility] OR;  Service: Vascular;  Laterality: Left;  with regional block   BRONCHIAL BIOPSY  12/18/2021   Procedure: BRONCHIAL BIOPSIES;  Surgeon: Josephine Igo, DO;  Location: [facility];   Service: Pulmonary;;   BRONCHIAL BRUSHINGS  12/18/2021   Procedure: BRONCHIAL BRUSHINGS;  Surgeon: Josephine Igo, DO;  Location: [facility];  Service: Pulmonary;;   BRONCHIAL NEEDLE ASPIRATION BIOPSY  12/18/2021   Procedure: BRONCHIAL NEEDLE ASPIRATION BIOPSIES;  Surgeon: Josephine Igo, DO;  Location: [facility];  Service: Pulmonary;;   CATARACT EXTRACTION     CHEST TUBE INSERTION Right 06/18/2022   Procedure: CHEST TUBE INSERTION;  Surgeon: Lovett Sox, MD;  Location: [facility] OR;  Service: Thoracic;  Laterality: Right;   EXCHANGE OF A DIALYSIS CATHETER N/A 08/08/2022   Procedure: EXCHANGE OF A TUNNELED DIALYSIS CATHETER;  Surgeon: Nada Libman, MD;  Location: [facility] OR;  Service: Vascular;  Laterality: N/A;   FIDUCIAL MARKER PLACEMENT  12/18/2021   Procedure: FIDUCIAL MARKER PLACEMENT;  Surgeon: Josephine Igo, DO;  Location: [facility];  Service: Pulmonary;;   IR FLUORO GUIDE CV LINE RIGHT  06/26/2022   IR US GUIDE VASC ACCESS RIGHT  06/26/2022   OVARY SURGERY     RIGHT OOPHORECTOMY Right    SMALL INTESTINE SURGERY     SUBXYPHOID PERICARDIAL WINDOW N/A 06/18/2022   Procedure: SUBXYPHOID PERICARDIAL WINDOW;  Surgeon: Lovett Sox, MD;  Location: [facility] OR;  Service: Thoracic;  Laterality: N/A;   TEE WITHOUT CARDIOVERSION N/A 06/18/2022   Procedure: TRANSESOPHAGEAL ECHOCARDIOGRAM (TEE);  Surgeon: Lovett Sox, MD;  Location: [facility] OR;  Service: Thoracic;  Laterality: N/A;   TUBAL LIGATION     VIDEO BRONCHOSCOPY WITH RADIAL ENDOBRONCHIAL ULTRASOUND  12/18/2021   Procedure: RADIAL ENDOBRONCHIAL ULTRASOUND;  Surgeon: Josephine Igo, DO;  Location: [facility];  Service: Pulmonary;;   FAMILY HISTORY Family History  Problem Relation Age of Onset   Diabetes Mother    Hypertension Mother    Hypertension Father    SOCIAL HISTORY Social History   Tobacco Use   Smoking status: Every Day    Current packs/day: 0.50    Average packs/day: 0.5 packs/day for 40.0 years (20.0 ttl  pk-yrs)    Types: Cigarettes   Smokeless tobacco: Never   Tobacco comments:    Cutting back 1/4-1/2 ppd  Vaping Use   Vaping status: Never Used  Substance Use Topics   Alcohol use: Not Currently   Drug use: Yes    Frequency: 4.0 times per week    Types: Marijuana    Comment: smokes every other day      OPHTHALMIC EXAM:  Base Eye Exam     Visual Acuity (Snellen - Linear)       Right Left   Dist cc 20/30 20/70   Dist ph cc 20/25 -2 NI    Correction: Glasses         Tonometry (Tonopen, 12:53 PM)       Right Left   Pressure 18 13         Pupils       Pupils Dark Light Shape React APD   Right PERRL 3 2 Round Brisk None   Left PERRL 3 2 Round Brisk None         Visual Fields       Left Right    Full Full         Extraocular Movement       Right Left    Full, Ortho Full, Ortho         Neuro/Psych     Oriented x3: Yes   Mood/Affect: Normal         Dilation     Both eyes: 1.0% Mydriacyl, 2.5% Phenylephrine @ 12:53 PM           Slit Lamp and Fundus Exam     Slit Lamp Exam       Right Left   Lids/Lashes Dermatochalasis - upper lid, mild MGD Dermatochalasis - upper lid, mild MGD   Conjunctiva/Sclera nasal pingeucula, Melanosis nasal pingeucula, Melanosis   Cornea arcus, well healed cataract wound, nasal LRI, 2+ Punctate epithelial erosions arcus, 1+ Punctate epithelial erosions, trace tear film debris   Anterior Chamber deep and clear deep and clear   Iris Round and dilated Round and dilated   Lens PC IOL in good position 2+ Nuclear sclerosis with brunescence,  2-3+ Cortical cataract   Anterior Vitreous mild syneresis, Posterior vitreous detachment mild syneresis         Fundus Exam       Right Left   Disc Pink and Sharp, temporal pallor, temporal PPP Pink and Sharp, temporal pallor, temporal PPP, no NVD, +SVP   C/D Ratio 0.3 0.3   Macula Good foveal reflex, scattered MA, DBH and punctate exudates -- improved, cystic changes --  slightly improved, focal flame heme along ST arcades good foveal reflex, dense central cluster of exudates -- improved, scattered MA, DBH and central edema -- slightly improved   Vessels attenuated, Tortuous, no NV attenuated, Tortuous, no NV   Periphery Attached, scattered MA Attached, scattered MA           Refraction     Wearing Rx       Sphere Cylinder Axis   Right -2.50 +0.50 158   Left -3.50 +0.75 151           IMAGING AND PROCEDURES  Imaging and Procedures for 07/08/2023  Sample to Blood Bank      Component   Blood Bank Specimen   SAMPLE AVAILABLE FOR TESTING     Sample Expiration   07/11/2023,2359 Performed at [facility], [address] [place], [place] [address]            CBC with Differential (Cancer Center Only)      Component Value Flag Ref Range Units Status   WBC Count 11.8      4.0 - 10.5 K/uL Final   RBC 2.91      3.87 - 5.11 MIL/uL Final   Hemoglobin 9.0      12.0 - 15.0 g/dL Final   HCT 29.5      28.4 - 46.0 % Final   MCV 97.6      80.0 - 100.0 fL Final   MCH 30.9      26.0 - 34.0 pg Final   MCHC 31.7      30.0 - 36.0 g/dL Final   RDW 13.2      44.0 - 15.5 % Final   Platelet Count 224      150 - 400 K/uL Final   nRBC 0.5      0.0 - 0.2 % Final   Neutrophils Relative % 78       % Final   Neutro Abs 9.3      1.7 - 7.7 K/uL Final   Lymphocytes Relative 12       % Final   Lymphs Abs 1.4      0.7 - 4.0 K/uL Final   Monocytes Relative 7       % Final   Monocytes Absolute 0.9      0.1 - 1.0 K/uL Final   Eosinophils Relative 1       % Final   Eosinophils Absolute 0.1      0.0 - 0.5 K/uL Final   Basophils Relative 1       % Final   Basophils Absolute 0.1      0.0 - 0.1 K/uL Final   Immature Granulocytes 1       % Final   Abs Immature Granulocytes 0.07      0.00 - 0.07 K/uL Final   Comment:   Performed at [facility] Lab at [facility], [address], [place], [place] [address]  Lactate dehydrogenase      Component Value Flag Ref Range Units Status   LDH 242      98 - 192 U/L Final   Comment:   Performed at [facility] Lab at [facility], [address] [place], [place] [address]           CMP (Cancer Center only)      Component Value Flag Ref Range Units Status   Sodium 141      135 - 145 mmol/L Final   Potassium 4.1      3.5 - 5.1 mmol/L Final   Chloride 98      98 - 111 mmol/L Final   CO2 30      22 - 32 mmol/L Final   Glucose, Bld 156      70 - 99 mg/dL Final   Comment:   Glucose reference range applies only to samples taken after fasting for at least 8 hours.   BUN 37      8 - 23 mg/dL Final   Creatinine 4.01      0.44 - 1.00 mg/dL Final   Calcium 9.5      8.9 - 10.3 mg/dL Final   Total Protein 7.0      6.5 - 8.1 g/dL Final   Albumin 3.8      3.5 - 5.0 g/dL Final   AST 18      15 - 41 U/L Final   ALT 14      0 - 44 U/L Final   Alkaline Phosphatase 81      38 - 126 U/L Final   Total Bilirubin 0.3      <1.2 mg/dL Final   GFR, Estimated 8      >60 mL/min Final   Comment:   (NOTE) Calculated using the CKD-EPI Creatinine Equation (2021)    Anion gap 13      5 - 15  Final   Comment:   Performed at [facility] Lab at [facility], [address], [place], [place] [address]           TSH      Component Value Flag Ref Range Units Status   TSH 0.413      0.350 - 4.500 uIU/mL Final   Comment:   Performed by a 3rd Generation assay with a functional sensitivity of <=0.01 uIU/mL. Performed at [facility], [address] [place], [place] [address]            Prealbumin      Component Value Flag Ref Range Units Status   Prealbumin 33      18 - 38 mg/dL Final   Comment:   Performed at [facility], [address] [place], [place] [address]           OCT, Retina - OU - Both Eyes       Right Eye Quality was good. Central Foveal Thickness: 239.  Progression has improved. Findings include normal foveal contour, no SRF, intraretinal hyper-reflective material, intraretinal fluid (persistent IRF / IRHM greatest ST mac -- slightly improved).   Left Eye Quality was good. Central Foveal Thickness: 177. Progression has improved. Findings include no SRF, abnormal foveal contour, subretinal hyper-reflective material, intraretinal hyper-reflective material, intraretinal fluid, outer retinal atrophy (Mild interval improvement in IRF/IRHM greatest IT macula).   Notes *Images captured and stored on drive  Diagnosis / Impression:  DME OU OD:  persistent IRF / IRHM greatest ST mac -- slightly improved OS: mild interval improvement in IRF/IRHM greatest IT macula  Clinical management:  See below  Abbreviations: NFP - Normal foveal profile. CME - cystoid macular edema. PED - pigment epithelial detachment. IRF - intraretinal fluid. SRF - subretinal fluid. EZ - ellipsoid zone. ERM - epiretinal membrane. ORA - outer retinal atrophy. ORT - outer retinal tubulation. SRHM - subretinal hyper-reflective material. IRHM - intraretinal hyper-reflective material      Intravitreal Injection, Pharmacologic Agent - OD - Right Eye       Time Out 07/08/2023. 1:13 PM. Confirmed correct patient, procedure, site, and patient consented.   Anesthesia Topical anesthesia was used. Anesthetic medications included Lidocaine 2%, Proparacaine 0.5%.   Procedure Preparation included 5% betadine to ocular surface, eyelid speculum. A supplied (32g) needle was used.   Injection: 1.25 mg Bevacizumab 1.25mg /0.41ml   Route: Intravitreal, Site: Right Eye   NDC: P3213405, Lot: [id number], Expiration date: 08/09/2023   Post-op Post injection exam found visual acuity of at least counting fingers. The patient tolerated the procedure well. There were no complications. The patient received written and verbal post procedure care education.      Intravitreal Injection,  Pharmacologic Agent - OS - Left Eye       Time Out 07/08/2023. 1:13 PM. Confirmed correct patient, procedure, site, and patient consented.   Anesthesia Topical anesthesia was used. Anesthetic medications included Lidocaine 2%, Proparacaine 0.5%.   Procedure Preparation included 5% betadine to ocular surface, eyelid speculum. A (32g) needle was used.   Injection: 2 mg aflibercept 2 MG/0.05ML   Route: Intravitreal, Site: Left Eye   NDC: L6038910, Lot: [id number], Expiration date: 12/27/2023, Waste: 0 mL   Post-op Post injection exam found visual acuity of at least counting fingers. The patient tolerated the procedure well. There were no complications. The patient received written and verbal post procedure care education.           ASSESSMENT/PLAN:  1,2 Severe Non-proliferative diabetic retinopathy, both eyes  - A1C 7.8 (08.13.24), 10.4 (05.09.24), 6.7 (11.30.23) - s/p IVA OD #1 (05.02.24), #2 (05.30.24), #3 (07.24.24), #4 (08.21.24), #5 (10.08.24), #6 (11.05.24) - s/p IVA OS #1 (04.04.24), #2 (05.02.24), #3 (05.30.24), #4 (07.24.24), #5 (08.21.24) -- IVA resistance OS - s/p IVE OS #1 (10.08.24), #2 (11.05.24) - BCVA OD 20/25 - stable, OS improved to 20/70 from 20/150 - exam shows scattered MA/DBH and exudates OU (OS > OD) - FA (04.04.24) shows leaking MA greatest posteriorly, no NV OU - OCT shows OD: persistent IRF / IRHM greatest ST mac -- slightly improved; OS: mild interval improvement in IRF/IRHM greatest IT macula at 5 weeks - recommend IVA OD #7 and IVE OS #3 today, 12.10.24 for DME w/ f/u in 5 wks again - pt wishes to proceed - RBA of procedure discussed, questions answered - Eylea approved with insurance - IVA informed consent obtained and signed, 04.04.24 (OU) - IVE informed consent obtained and signed, 10.08.24 (OU) - see procedure note - f/u in 5 wks -- DFE/OCT, possible injection  3,4. Hypertensive retinopathy OU - discussed importance of tight BP control -  monitor  5. Mixed Cataract OS - The symptoms of cataract, surgical options, and treatments and risks were discussed with patient. - discussed diagnosis and progression - under the expert management of [facility] - pt wishes to have cataract surgery this year in 2024 - clear from a retina standpoint to proceed with cataract surgery when pt and surgeon are ready  -  has appointment with [facility] in December   6. Pseudophakia OD  - s/p CE/IOL OD (Dr. Dione Booze, 2017)  - IOL in good position, doing well  - monitor  Ophthalmic Meds Ordered this visit:  Meds ordered this encounter  Medications   aflibercept (EYLEA) SOLN 2 mg   Bevacizumab (AVASTIN) SOLN 1.25 mg    Return in about 5 weeks (around 08/12/2023) for f/u NPDR OU, DFE, OCT.  There are no Patient Instructions on file for this visit.  Explained the diagnoses, plan, and follow up with the patient and they expressed understanding.  Patient expressed understanding of the importance of proper follow up care.   This document serves as a record of services personally performed by Karie Chimera, MD, PhD. It was created on their behalf by Glee Arvin. Manson Passey, OA an ophthalmic technician. The creation of this record is the provider's dictation and/or activities during the visit.    Electronically signed by: Glee Arvin. Manson Passey, OA 07/08/23 8:02 PM  This document serves as a record of services personally performed by Karie Chimera, MD, PhD. It was created on their behalf by Glee Arvin. Manson Passey, OA an ophthalmic technician. The creation of this record is the provider's dictation and/or activities during the visit.    Electronically signed by: Glee Arvin. Manson Passey, OA 07/08/23 8:02 PM  Karie Chimera, M.D., Ph.D. Diseases & Surgery of the Retina and Vitreous [facility]  I have reviewed the above documentation for accuracy and completeness, and I agree with the above. Karie Chimera, M.D., Ph.D. 07/08/23 8:04  PM  Abbreviations: M myopia (nearsighted); A astigmatism; H hyperopia (farsighted); P presbyopia; Mrx spectacle prescription;  CTL contact lenses; OD right eye; OS left eye; OU both eyes  XT exotropia; ET esotropia; PEK punctate epithelial keratitis; PEE punctate epithelial erosions; DES dry eye syndrome; MGD meibomian gland dysfunction; ATs artificial tears; PFAT's preservative free artificial tears; NSC nuclear sclerotic cataract; PSC posterior subcapsular cataract; ERM epi-retinal membrane; PVD posterior vitreous detachment; RD retinal detachment; DM diabetes mellitus; DR diabetic retinopathy; NPDR non-proliferative diabetic retinopathy; PDR proliferative diabetic retinopathy; CSME clinically significant macular edema; DME diabetic macular edema; dbh dot blot hemorrhages; CWS cotton wool spot; POAG primary open angle glaucoma; C/D cup-to-disc ratio; HVF humphrey visual field; GVF goldmann visual field; OCT optical coherence tomography; IOP intraocular pressure; BRVO Branch retinal vein occlusion; CRVO central retinal vein occlusion; CRAO central retinal artery occlusion; BRAO branch retinal artery occlusion; RT retinal tear; SB scleral buckle; PPV pars plana vitrectomy; VH Vitreous hemorrhage; PRP panretinal laser photocoagulation; IVK intravitreal kenalog; VMT vitreomacular traction; MH Macular hole;  NVD neovascularization of the disc; NVE neovascularization elsewhere; AREDS age related eye disease study; ARMD age related macular degeneration; POAG primary open angle glaucoma; EBMD epithelial/anterior basement membrane dystrophy; ACIOL anterior chamber intraocular lens; IOL intraocular lens; PCIOL posterior chamber intraocular lens; Phaco/IOL phacoemulsification with intraocular lens placement; PRK photorefractive keratectomy; LASIK laser assisted in situ keratomileusis; HTN hypertension; DM diabetes mellitus; COPD chronic obstructive pulmonary disease

## 2023-07-08 ENCOUNTER — Other Ambulatory Visit: Payer: Self-pay

## 2023-07-08 ENCOUNTER — Encounter: Payer: Self-pay | Admitting: Hematology & Oncology

## 2023-07-08 ENCOUNTER — Ambulatory Visit (INDEPENDENT_AMBULATORY_CARE_PROVIDER_SITE_OTHER): Payer: 59 | Admitting: Ophthalmology

## 2023-07-08 ENCOUNTER — Inpatient Hospital Stay: Payer: 59 | Attending: Radiation Oncology

## 2023-07-08 ENCOUNTER — Inpatient Hospital Stay: Payer: 59

## 2023-07-08 ENCOUNTER — Encounter (INDEPENDENT_AMBULATORY_CARE_PROVIDER_SITE_OTHER): Payer: Self-pay | Admitting: Ophthalmology

## 2023-07-08 ENCOUNTER — Inpatient Hospital Stay (HOSPITAL_BASED_OUTPATIENT_CLINIC_OR_DEPARTMENT_OTHER): Payer: 59 | Admitting: Hematology & Oncology

## 2023-07-08 VITALS — BP 134/51 | HR 88 | Temp 98.4°F | Resp 18 | Ht 67.0 in | Wt 117.0 lb

## 2023-07-08 DIAGNOSIS — Z79630 Long term (current) use of alkylating agent: Secondary | ICD-10-CM | POA: Diagnosis not present

## 2023-07-08 DIAGNOSIS — Z5112 Encounter for antineoplastic immunotherapy: Secondary | ICD-10-CM | POA: Insufficient documentation

## 2023-07-08 DIAGNOSIS — Z5111 Encounter for antineoplastic chemotherapy: Secondary | ICD-10-CM | POA: Diagnosis present

## 2023-07-08 DIAGNOSIS — Z992 Dependence on renal dialysis: Secondary | ICD-10-CM | POA: Diagnosis not present

## 2023-07-08 DIAGNOSIS — I10 Essential (primary) hypertension: Secondary | ICD-10-CM

## 2023-07-08 DIAGNOSIS — Z961 Presence of intraocular lens: Secondary | ICD-10-CM

## 2023-07-08 DIAGNOSIS — C3491 Malignant neoplasm of unspecified part of right bronchus or lung: Secondary | ICD-10-CM | POA: Insufficient documentation

## 2023-07-08 DIAGNOSIS — R042 Hemoptysis: Secondary | ICD-10-CM | POA: Diagnosis not present

## 2023-07-08 DIAGNOSIS — N184 Chronic kidney disease, stage 4 (severe): Secondary | ICD-10-CM | POA: Diagnosis not present

## 2023-07-08 DIAGNOSIS — C3411 Malignant neoplasm of upper lobe, right bronchus or lung: Secondary | ICD-10-CM | POA: Diagnosis not present

## 2023-07-08 DIAGNOSIS — Z794 Long term (current) use of insulin: Secondary | ICD-10-CM | POA: Diagnosis not present

## 2023-07-08 DIAGNOSIS — Z79620 Long term (current) use of immunosuppressive biologic: Secondary | ICD-10-CM | POA: Insufficient documentation

## 2023-07-08 DIAGNOSIS — Z5189 Encounter for other specified aftercare: Secondary | ICD-10-CM | POA: Diagnosis not present

## 2023-07-08 DIAGNOSIS — Z79634 Long term (current) use of topoisomerase inhibitor: Secondary | ICD-10-CM | POA: Diagnosis not present

## 2023-07-08 DIAGNOSIS — D649 Anemia, unspecified: Secondary | ICD-10-CM | POA: Insufficient documentation

## 2023-07-08 DIAGNOSIS — N19 Unspecified kidney failure: Secondary | ICD-10-CM | POA: Diagnosis not present

## 2023-07-08 DIAGNOSIS — D631 Anemia in chronic kidney disease: Secondary | ICD-10-CM | POA: Diagnosis not present

## 2023-07-08 DIAGNOSIS — H35033 Hypertensive retinopathy, bilateral: Secondary | ICD-10-CM | POA: Diagnosis not present

## 2023-07-08 DIAGNOSIS — H25812 Combined forms of age-related cataract, left eye: Secondary | ICD-10-CM

## 2023-07-08 DIAGNOSIS — E113413 Type 2 diabetes mellitus with severe nonproliferative diabetic retinopathy with macular edema, bilateral: Secondary | ICD-10-CM | POA: Diagnosis not present

## 2023-07-08 HISTORY — DX: Anemia in chronic kidney disease: D63.1

## 2023-07-08 LAB — CMP (CANCER CENTER ONLY)
ALT: 14 U/L (ref 0–44)
AST: 18 U/L (ref 15–41)
Albumin: 3.8 g/dL (ref 3.5–5.0)
Alkaline Phosphatase: 81 U/L (ref 38–126)
Anion gap: 13 (ref 5–15)
BUN: 37 mg/dL — ABNORMAL HIGH (ref 8–23)
CO2: 30 mmol/L (ref 22–32)
Calcium: 9.5 mg/dL (ref 8.9–10.3)
Chloride: 98 mmol/L (ref 98–111)
Creatinine: 5.45 mg/dL — ABNORMAL HIGH (ref 0.44–1.00)
GFR, Estimated: 8 mL/min — ABNORMAL LOW (ref >60)
Glucose, Bld: 156 mg/dL — ABNORMAL HIGH (ref 70–99)
Potassium: 4.1 mmol/L (ref 3.5–5.1)
Sodium: 141 mmol/L (ref 135–145)
Total Bilirubin: 0.3 mg/dL (ref <1.2)
Total Protein: 7 g/dL (ref 6.5–8.1)

## 2023-07-08 LAB — CBC WITH DIFFERENTIAL (CANCER CENTER ONLY)
Abs Immature Granulocytes: 0.07 10*3/uL (ref 0.00–0.07)
Basophils Absolute: 0.1 10*3/uL (ref 0.0–0.1)
Basophils Relative: 1 %
Eosinophils Absolute: 0.1 10*3/uL (ref 0.0–0.5)
Eosinophils Relative: 1 %
HCT: 28.4 % — ABNORMAL LOW (ref 36.0–46.0)
Hemoglobin: 9 g/dL — ABNORMAL LOW (ref 12.0–15.0)
Immature Granulocytes: 1 %
Lymphocytes Relative: 12 %
Lymphs Abs: 1.4 10*3/uL (ref 0.7–4.0)
MCH: 30.9 pg (ref 26.0–34.0)
MCHC: 31.7 g/dL (ref 30.0–36.0)
MCV: 97.6 fL (ref 80.0–100.0)
Monocytes Absolute: 0.9 10*3/uL (ref 0.1–1.0)
Monocytes Relative: 7 %
Neutro Abs: 9.3 10*3/uL — ABNORMAL HIGH (ref 1.7–7.7)
Neutrophils Relative %: 78 %
Platelet Count: 224 10*3/uL (ref 150–400)
RBC: 2.91 MIL/uL — ABNORMAL LOW (ref 3.87–5.11)
RDW: 22.7 % — ABNORMAL HIGH (ref 11.5–15.5)
WBC Count: 11.8 10*3/uL — ABNORMAL HIGH (ref 4.0–10.5)
nRBC: 0.5 % — ABNORMAL HIGH (ref 0.0–0.2)

## 2023-07-08 LAB — TSH: TSH: 0.413 u[IU]/mL (ref 0.350–4.500)

## 2023-07-08 LAB — LACTATE DEHYDROGENASE: LDH: 242 U/L — ABNORMAL HIGH (ref 98–192)

## 2023-07-08 LAB — SAMPLE TO BLOOD BANK

## 2023-07-08 LAB — PREALBUMIN: Prealbumin: 33 mg/dL (ref 18–38)

## 2023-07-08 MED ORDER — AFLIBERCEPT 2MG/0.05ML IZ SOLN FOR KALEIDOSCOPE
2.0000 mg | INTRAVITREAL | Status: AC | PRN
  Administered 2023-07-08: 2 mg via INTRAVITREAL

## 2023-07-08 MED ORDER — BEVACIZUMAB CHEMO INJECTION 1.25MG/0.05ML SYRINGE FOR KALEIDOSCOPE
1.2500 mg | INTRAVITREAL | Status: AC | PRN
  Administered 2023-07-08: 1.25 mg via INTRAVITREAL

## 2023-07-08 NOTE — Progress Notes (Signed)
Hematology and Oncology Follow Up Visit  Amber Cross [id number] September 18, 1960 62 y.o. 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  It has been a while since we last saw her.  She actually has been doing fairly well.  We did do a PET scan on her back in November.  Thankfully, the PET scan showed that she was responding to treatment.  She is doing okay.  She had a nice Thanksgiving..  She is still getting dialysis.  She is dialysis Monday-Wednesday-Friday.  I think we are to have to give her some ESA.  Her erythropoietin level is only 18.  I think that she would respond to Aranesp.  Hopefully, we will get the insurance to approve this..  She seems to be eating okay.  She is on Marinol.  She has had no problems with pain.  There has been no bleeding.  She has occasional bouts of mild hemoptysis.  She has had no diarrhea.  Overall, I would say that her performance status is probably ECOG 2.     Medications:  Current Outpatient Medications:    acetaminophen (TYLENOL) 500 MG tablet, Take 1,000 mg by mouth every 6 (six) hours as needed for mild pain., Disp: , Rfl:    albuterol (VENTOLIN HFA) 108 (90 Base) MCG/ACT inhaler, Inhale 2 puffs into the lungs every 6 (six) hours as needed for wheezing or shortness of breath., Disp: 6.7 g, Rfl: 2   amLODipine (NORVASC) 10 MG tablet, Take 1 tablet (10 mg total) by mouth daily., Disp: 90 tablet, Rfl: 3   amlodipine-olmesartan (AZOR) 10-20 MG tablet, Take 1 tablet by mouth at bedtime., Disp: , Rfl:    aspirin EC 81 MG tablet, Take 1 tablet (81 mg total) by mouth daily. Swallow whole.,  Disp: 30 tablet, Rfl: 11   atorvastatin (LIPITOR) 80 MG tablet, Take 1 tablet (80 mg total) by mouth daily., Disp: 90 tablet, Rfl: 3   B Complex-C-Zn-Folic Acid (DIALYVITE 800 WITH ZINC) 0.8 MG TABS, Take 1 tablet by mouth daily., Disp: , Rfl:    Blood Glucose Calibration (MYGLUCOHEALTH CONTROL) SOLN, , Disp: , Rfl:    calcium acetate (PHOSLO) 667 MG tablet, Take 667 mg by mouth 3 (three) times daily., Disp: , Rfl:    Calcium Acetate 668 (169 Ca) MG TABS, Take 1 tablet by mouth 3 (three) times daily., Disp: , Rfl:    carvedilol (COREG) 12.5 MG tablet, Take 12.5 mg by mouth., Disp: , Rfl:    Continuous Glucose Sensor (DEXCOM G7 SENSOR) MISC, Apply each device on your skin every 10 days, Disp: 3 each, Rfl: 2   dexamethasone (DECADRON) 4 MG tablet, Take 2 tabs by mouth starting day after last dose of etoposide for one day only. Repeat every 21 days. Take with food., Disp: 8 tablet, Rfl: 0   dronabinol (MARINOL) 2.5 MG capsule, Take 1 capsule (2.5 mg total) by mouth 2 (two) times daily before a meal., Disp: 60 capsule, Rfl: 0   furosemide (LASIX) 40 MG tablet, Take 1 tablet (40 mg total) by mouth daily as needed (swelling in legs or weight gain)., Disp: 30 tablet, Rfl: 1   gabapentin (NEURONTIN) 600 MG tablet,  Take 600 mg by mouth daily., Disp: , Rfl:    glucose blood (PRECISION QID TEST) test strip, , Disp: , Rfl:    hydrALAZINE (APRESOLINE) 50 MG tablet, Take 1 tablet (50 mg total) by mouth every 8 (eight) hours., Disp: 180 tablet, Rfl: 3   Insulin Pen Needle (UNIFINE PENTIPS) 32G X 4 MM MISC, Use in the morning and at bedtime, Disp: 100 each, Rfl: 3   Lancet Devices (ADJUSTABLE LANCING DEVICE) MISC, , Disp: , Rfl:    losartan (COZAAR) 25 MG tablet, Take 1 tablet (25 mg total) by mouth daily., Disp: 30 tablet, Rfl: 11   megestrol (MEGACE ES) 625 MG/5ML suspension, Take 5 mLs (625 mg total) by mouth daily., Disp: 150 mL, Rfl: 4   megestrol (MEGACE) 400 MG/10ML suspension, Take 10 mLs (400 mg total) by  mouth 2 (two) times daily., Disp: 480 mL, Rfl: 3   ondansetron (ZOFRAN) 8 MG tablet, Take 1 tablet (8 mg total) by mouth every 8 (eight) hours as needed for nausea, vomiting or refractory nausea / vomiting. Start on the third day after carboplatin., Disp: 30 tablet, Rfl: 1   pantoprazole (PROTONIX) 40 MG tablet, Take 40 mg by mouth daily., Disp: , Rfl:    prochlorperazine (COMPAZINE) 10 MG tablet, Take 1 tablet (10 mg total) by mouth every 6 (six) hours as needed for nausea or vomiting., Disp: 30 tablet, Rfl: 1   sevelamer carbonate (RENVELA) 800 MG tablet, Take 800 mg by mouth 3 (three) times daily with meals., Disp: , Rfl:    traZODone (DESYREL) 50 MG tablet, Take 1 tablet (50 mg total) by mouth at bedtime., Disp: 90 tablet, Rfl: 1   Vitamin D, Ergocalciferol, (DRISDOL) 1.25 MG (50000 UNIT) CAPS capsule, Take 1 capsule (50,000 Units total) by mouth every 7 (seven) days., Disp: 5 capsule, Rfl: 0  Allergies:  Allergies  Allergen Reactions   Penicillins Itching and Other (See Comments)    redness   Strawberry (Diagnostic) Itching and Swelling   Tomato Itching and Swelling    Past Medical History, Surgical history, Social history, and Family History were reviewed and updated.  Review of Systems: Review of Systems  Constitutional:  Positive for appetite change and unexpected weight change.  HENT:  Negative.    Eyes: Negative.   Respiratory: Negative.    Cardiovascular: Negative.   Gastrointestinal: Negative.   Endocrine: Negative.   Genitourinary: Negative.    Musculoskeletal: Negative.   Skin: Negative.   Neurological: Negative.   Hematological: Negative.   Psychiatric/Behavioral: Negative.      Physical Exam:  height is 5\' 7"  (1.702 m) and weight is 117 lb (53.1 kg). Her oral temperature is 98.4 F (36.9 C). Her blood pressure is 134/51 (abnormal) and her pulse is 88. Her respiration is 18 and oxygen saturation is 100%.   Wt Readings from Last 3 Encounters:  07/08/23 117 lb  (53.1 kg)  05/27/23 112 lb (50.8 kg)  05/09/23 115 lb (52.2 kg)    Physical Exam Vitals reviewed.  HENT:     Head: Normocephalic and atraumatic.  Eyes:     Pupils: Pupils are equal, round, and reactive to light.  Cardiovascular:     Rate and Rhythm: Normal rate and regular rhythm.     Heart sounds: Normal heart sounds.  Pulmonary:     Effort: Pulmonary effort is normal.     Breath sounds: Normal breath sounds.  Abdominal:     General: Bowel sounds are normal.     Palpations:  Abdomen is soft.  Musculoskeletal:        General: No tenderness or deformity. Normal range of motion.     Cervical back: Normal range of motion.  Lymphadenopathy:     Cervical: No cervical adenopathy.  Skin:    General: Skin is warm and dry.     Findings: No erythema or rash.  Neurological:     Mental Status: She is alert and oriented to person, place, and time.  Psychiatric:        Behavior: Behavior normal.        Thought Content: Thought content normal.        Judgment: Judgment normal.      Lab Results  Component Value Date   WBC 11.8 (H) 07/08/2023   HGB 9.0 (L) 07/08/2023   HCT 28.4 (L) 07/08/2023   MCV 97.6 07/08/2023   PLT 224 07/08/2023     Chemistry      Component Value Date/Time   NA 141 07/08/2023 0825   NA 141 03/11/2023 1220   K 4.1 07/08/2023 0825   CL 98 07/08/2023 0825   CO2 30 07/08/2023 0825   BUN 37 (H) 07/08/2023 0825   BUN 26 03/11/2023 1220   CREATININE 5.45 (H) 07/08/2023 0825      Component Value Date/Time   CALCIUM 9.5 07/08/2023 0825   ALKPHOS 81 07/08/2023 0825   AST 18 07/08/2023 0825   ALT 14 07/08/2023 0825   BILITOT 0.3 07/08/2023 0825      Impression and Plan: Ms. Bray is a very nice 62 year old African-American female.  She has history of limited stage small cell lung cancer.  Unfortunate, because of her overall health status, she really could not have combined radiation therapy and chemotherapy.  We will go ahead with her next cycle.   This to be a third cycle.  Will do this next week.  Again, we will see if we can add an ESA  to try to help get her hemoglobin a little bit higher.  I will see her back next week.   Josph Macho, MD 12/10/20249:28 AM

## 2023-07-09 ENCOUNTER — Other Ambulatory Visit: Payer: Self-pay

## 2023-07-10 ENCOUNTER — Inpatient Hospital Stay: Payer: 59

## 2023-07-15 ENCOUNTER — Encounter: Payer: Self-pay | Admitting: Hematology & Oncology

## 2023-07-15 ENCOUNTER — Inpatient Hospital Stay: Payer: 59

## 2023-07-15 ENCOUNTER — Telehealth: Payer: Self-pay | Admitting: *Deleted

## 2023-07-15 VITALS — BP 152/62 | HR 94 | Temp 98.7°F | Resp 18

## 2023-07-15 DIAGNOSIS — Z5111 Encounter for antineoplastic chemotherapy: Secondary | ICD-10-CM | POA: Diagnosis not present

## 2023-07-15 DIAGNOSIS — C3411 Malignant neoplasm of upper lobe, right bronchus or lung: Secondary | ICD-10-CM

## 2023-07-15 LAB — CMP (CANCER CENTER ONLY)
ALT: 9 U/L (ref 0–44)
AST: 10 U/L — ABNORMAL LOW (ref 15–41)
Albumin: 4 g/dL (ref 3.5–5.0)
Alkaline Phosphatase: 75 U/L (ref 38–126)
Anion gap: 14 (ref 5–15)
BUN: 24 mg/dL — ABNORMAL HIGH (ref 8–23)
CO2: 29 mmol/L (ref 22–32)
Calcium: 9.1 mg/dL (ref 8.9–10.3)
Chloride: 96 mmol/L — ABNORMAL LOW (ref 98–111)
Creatinine: 4.87 mg/dL — ABNORMAL HIGH (ref 0.44–1.00)
GFR, Estimated: 10 mL/min — ABNORMAL LOW (ref >60)
Glucose, Bld: 285 mg/dL — ABNORMAL HIGH (ref 70–99)
Potassium: 3.7 mmol/L (ref 3.5–5.1)
Sodium: 139 mmol/L (ref 135–145)
Total Bilirubin: 0.3 mg/dL (ref <1.2)
Total Protein: 7 g/dL (ref 6.5–8.1)

## 2023-07-15 LAB — CBC WITH DIFFERENTIAL (CANCER CENTER ONLY)
Abs Immature Granulocytes: 0.02 10*3/uL (ref 0.00–0.07)
Basophils Absolute: 0 10*3/uL (ref 0.0–0.1)
Basophils Relative: 1 %
Eosinophils Absolute: 0 10*3/uL (ref 0.0–0.5)
Eosinophils Relative: 1 %
HCT: 32.7 % — ABNORMAL LOW (ref 36.0–46.0)
Hemoglobin: 10.3 g/dL — ABNORMAL LOW (ref 12.0–15.0)
Immature Granulocytes: 0 %
Lymphocytes Relative: 12 %
Lymphs Abs: 0.9 10*3/uL (ref 0.7–4.0)
MCH: 31.4 pg (ref 26.0–34.0)
MCHC: 31.5 g/dL (ref 30.0–36.0)
MCV: 99.7 fL (ref 80.0–100.0)
Monocytes Absolute: 0.5 10*3/uL (ref 0.1–1.0)
Monocytes Relative: 6 %
Neutro Abs: 6 10*3/uL (ref 1.7–7.7)
Neutrophils Relative %: 80 %
Platelet Count: 120 10*3/uL — ABNORMAL LOW (ref 150–400)
RBC: 3.28 MIL/uL — ABNORMAL LOW (ref 3.87–5.11)
RDW: 22.2 % — ABNORMAL HIGH (ref 11.5–15.5)
WBC Count: 7.5 10*3/uL (ref 4.0–10.5)
nRBC: 0 % (ref 0.0–0.2)

## 2023-07-15 LAB — TSH: TSH: 0.665 u[IU]/mL (ref 0.350–4.500)

## 2023-07-15 MED ORDER — SODIUM CHLORIDE 0.9 % IV SOLN
170.0000 mg | Freq: Once | INTRAVENOUS | Status: AC
  Administered 2023-07-15: 170 mg via INTRAVENOUS
  Filled 2023-07-15: qty 17

## 2023-07-15 MED ORDER — SODIUM CHLORIDE 0.9 % IV SOLN
Freq: Once | INTRAVENOUS | Status: AC
Start: 2023-07-15 — End: 2023-07-15

## 2023-07-15 MED ORDER — SODIUM CHLORIDE 0.9 % IV SOLN
80.0000 mg/m2 | Freq: Once | INTRAVENOUS | Status: AC
  Administered 2023-07-15: 124 mg via INTRAVENOUS
  Filled 2023-07-15: qty 6.2

## 2023-07-15 MED ORDER — SODIUM CHLORIDE 0.9 % IV SOLN
1200.0000 mg | Freq: Once | INTRAVENOUS | Status: AC
  Administered 2023-07-15: 1200 mg via INTRAVENOUS
  Filled 2023-07-15: qty 20

## 2023-07-15 MED ORDER — DEXAMETHASONE SODIUM PHOSPHATE 10 MG/ML IJ SOLN
10.0000 mg | Freq: Once | INTRAMUSCULAR | Status: AC
Start: 2023-07-15 — End: 2023-07-15
  Administered 2023-07-15: 10 mg via INTRAVENOUS
  Filled 2023-07-15: qty 1

## 2023-07-15 MED ORDER — PALONOSETRON HCL INJECTION 0.25 MG/5ML
0.2500 mg | Freq: Once | INTRAVENOUS | Status: AC
  Administered 2023-07-15: 0.25 mg via INTRAVENOUS
  Filled 2023-07-15: qty 5

## 2023-07-15 MED ORDER — SODIUM CHLORIDE 0.9 % IV SOLN
150.0000 mg | Freq: Once | INTRAVENOUS | Status: AC
  Administered 2023-07-15: 150 mg via INTRAVENOUS
  Filled 2023-07-15: qty 150

## 2023-07-15 NOTE — Telephone Encounter (Signed)
Per Clent Jacks, PA, it's OK to treat with today's labs. Infusion nurse notified.

## 2023-07-15 NOTE — Progress Notes (Unsigned)
Ok to treat today per Clent Jacks PA with Creatinine of 4.87.  All labs reviewed with Clent Jacks PA and ok for treatment

## 2023-07-15 NOTE — Patient Instructions (Signed)
[facility]  Discharge Instructions: Thank you for choosing [facility] to provide your oncology and hematology care.   If you have a lab appointment with the Cancer Center, please go directly to the Cancer Center and check in at the registration area.  Wear comfortable clothing and clothing appropriate for easy access to any Portacath or PICC line.   We strive to give you quality time with your provider. You may need to reschedule your appointment if you arrive late (15 or more minutes).  Arriving late affects you and other patients whose appointments are after yours.  Also, if you miss three or more appointments without notifying the office, you may be dismissed from the clinic at the provider's discretion.      For prescription refill requests, have your pharmacy contact our office and allow 72 hours for refills to be completed.    Today you received the following chemotherapy and/or immunotherapy agents carbo, [id number]      To help prevent nausea and vomiting after your treatment, we encourage you to take your nausea medication as directed.  BELOW ARE SYMPTOMS THAT SHOULD BE REPORTED IMMEDIATELY: *FEVER GREATER THAN 100.4 F (38 C) OR HIGHER *CHILLS OR SWEATING *NAUSEA AND VOMITING THAT IS NOT CONTROLLED WITH YOUR NAUSEA MEDICATION *UNUSUAL SHORTNESS OF BREATH *UNUSUAL BRUISING OR BLEEDING *URINARY PROBLEMS (pain or burning when urinating, or frequent urination) *BOWEL PROBLEMS (unusual diarrhea, constipation, pain near the anus) TENDERNESS IN MOUTH AND THROAT WITH OR WITHOUT PRESENCE OF ULCERS (sore throat, sores in mouth, or a toothache) UNUSUAL RASH, SWELLING OR PAIN  UNUSUAL VAGINAL DISCHARGE OR ITCHING   Items with * indicate a potential emergency and should be followed up as soon as possible or go to the Emergency Department if any problems should occur.  Please show the CHEMOTHERAPY ALERT CARD or  IMMUNOTHERAPY ALERT CARD at check-in to the Emergency Department and triage nurse. Should you have questions after your visit or need to cancel or reschedule your appointment, please contact [facility] - A DEPT OF [facility]  [phone] and follow the prompts.  Office hours are 8:00 a.m. to 4:30 p.m. Monday - Friday. Please note that voicemails left after 4:00 p.m. may not be returned until the following business day.  We are closed weekends and major holidays. You have access to a nurse at all times for urgent questions. Please call the main number to the clinic [phone] and follow the prompts.  For any non-urgent questions, you may also contact your provider using MyChart. We now offer e-Visits for anyone 66 and older to request care online for non-urgent symptoms. For details visit [email or web address].   Also download the MyChart app! Go to the app store, search "MyChart", open the app, select [place], and log in with your MyChart username and password.

## 2023-07-16 ENCOUNTER — Encounter: Payer: Self-pay | Admitting: Hematology & Oncology

## 2023-07-16 ENCOUNTER — Inpatient Hospital Stay: Payer: 59

## 2023-07-16 VITALS — BP 172/67 | HR 89 | Temp 98.1°F | Resp 20

## 2023-07-16 DIAGNOSIS — C3411 Malignant neoplasm of upper lobe, right bronchus or lung: Secondary | ICD-10-CM

## 2023-07-16 DIAGNOSIS — Z5111 Encounter for antineoplastic chemotherapy: Secondary | ICD-10-CM | POA: Diagnosis not present

## 2023-07-16 LAB — T4: T4, Total: 8 ug/dL (ref 4.5–12.0)

## 2023-07-16 MED ORDER — SODIUM CHLORIDE 0.9 % IV SOLN
80.0000 mg/m2 | Freq: Once | INTRAVENOUS | Status: AC
  Administered 2023-07-16: 124 mg via INTRAVENOUS
  Filled 2023-07-16: qty 6.2

## 2023-07-16 MED ORDER — SODIUM CHLORIDE 0.9 % IV SOLN: Freq: Once | INTRAVENOUS | Status: AC

## 2023-07-16 MED ORDER — DEXAMETHASONE SODIUM PHOSPHATE 10 MG/ML IJ SOLN
10.0000 mg | Freq: Once | INTRAMUSCULAR | Status: AC
Start: 2023-07-16 — End: 2023-07-16
  Administered 2023-07-16: 10 mg via INTRAVENOUS
  Filled 2023-07-16: qty 1

## 2023-07-16 NOTE — Patient Instructions (Signed)
[facility]  Discharge Instructions: Thank you for choosing [facility] to provide your oncology and hematology care.   If you have a lab appointment with the Cancer Center, please go directly to the Cancer Center and check in at the registration area.  Wear comfortable clothing and clothing appropriate for easy access to any Portacath or PICC line.   We strive to give you quality time with your provider. You may need to reschedule your appointment if you arrive late (15 or more minutes).  Arriving late affects you and other patients whose appointments are after yours.  Also, if you miss three or more appointments without notifying the office, you may be dismissed from the clinic at the provider's discretion.      For prescription refill requests, have your pharmacy contact our office and allow 72 hours for refills to be completed.    Today you received the following chemotherapy and/or immunotherapy agents ETOPOSIDE      To help prevent nausea and vomiting after your treatment, we encourage you to take your nausea medication as directed.  BELOW ARE SYMPTOMS THAT SHOULD BE REPORTED IMMEDIATELY: *FEVER GREATER THAN 100.4 F (38 C) OR HIGHER *CHILLS OR SWEATING *NAUSEA AND VOMITING THAT IS NOT CONTROLLED WITH YOUR NAUSEA MEDICATION *UNUSUAL SHORTNESS OF BREATH *UNUSUAL BRUISING OR BLEEDING *URINARY PROBLEMS (pain or burning when urinating, or frequent urination) *BOWEL PROBLEMS (unusual diarrhea, constipation, pain near the anus) TENDERNESS IN MOUTH AND THROAT WITH OR WITHOUT PRESENCE OF ULCERS (sore throat, sores in mouth, or a toothache) UNUSUAL RASH, SWELLING OR PAIN  UNUSUAL VAGINAL DISCHARGE OR ITCHING   Items with * indicate a potential emergency and should be followed up as soon as possible or go to the Emergency Department if any problems should occur.  Please show the CHEMOTHERAPY ALERT CARD or IMMUNOTHERAPY  ALERT CARD at check-in to the Emergency Department and triage nurse. Should you have questions after your visit or need to cancel or reschedule your appointment, please contact [facility] [facility] - A DEPT OF [facility]  [phone] and follow the prompts.  Office hours are 8:00 a.m. to 4:30 p.m. Monday - Friday. Please note that voicemails left after 4:00 p.m. may not be returned until the following business day.  We are closed weekends and major holidays. You have access to a nurse at all times for urgent questions. Please call the main number to the clinic [phone] and follow the prompts.  For any non-urgent questions, you may also contact your provider using MyChart. We now offer e-Visits for anyone 53 and older to request care online for non-urgent symptoms. For details visit [email or web address].   Also download the MyChart app! Go to the app store, search "MyChart", open the app, select [place], and log in with your MyChart username and password.

## 2023-07-17 ENCOUNTER — Inpatient Hospital Stay: Payer: 59

## 2023-07-17 VITALS — BP 177/63 | HR 79 | Temp 97.9°F | Resp 17

## 2023-07-17 DIAGNOSIS — C3411 Malignant neoplasm of upper lobe, right bronchus or lung: Secondary | ICD-10-CM

## 2023-07-17 DIAGNOSIS — Z5111 Encounter for antineoplastic chemotherapy: Secondary | ICD-10-CM | POA: Diagnosis not present

## 2023-07-17 MED ORDER — DEXAMETHASONE SODIUM PHOSPHATE 10 MG/ML IJ SOLN
10.0000 mg | Freq: Once | INTRAMUSCULAR | Status: AC
Start: 2023-07-17 — End: 2023-07-17
  Administered 2023-07-17: 10 mg via INTRAVENOUS
  Filled 2023-07-17: qty 1

## 2023-07-17 MED ORDER — SODIUM CHLORIDE 0.9 % IV SOLN: Freq: Once | INTRAVENOUS | Status: AC

## 2023-07-17 MED ORDER — ETOPOSIDE CHEMO INJECTION 1 GM/50ML
80.0000 mg/m2 | Freq: Once | INTRAVENOUS | Status: AC
  Administered 2023-07-17: 124 mg via INTRAVENOUS
  Filled 2023-07-17: qty 6.2

## 2023-07-17 NOTE — Patient Instructions (Signed)
[facility]  Discharge Instructions: Thank you for choosing [facility] to provide your oncology and hematology care.   If you have a lab appointment with the Cancer Center, please go directly to the Cancer Center and check in at the registration area.  Wear comfortable clothing and clothing appropriate for easy access to any Portacath or PICC line.   We strive to give you quality time with your provider. You may need to reschedule your appointment if you arrive late (15 or more minutes).  Arriving late affects you and other patients whose appointments are after yours.  Also, if you miss three or more appointments without notifying the office, you may be dismissed from the clinic at the provider's discretion.      For prescription refill requests, have your pharmacy contact our office and allow 72 hours for refills to be completed.    Today you received the following chemotherapy and/or immunotherapy agents ETOPOSIDE      To help prevent nausea and vomiting after your treatment, we encourage you to take your nausea medication as directed.  BELOW ARE SYMPTOMS THAT SHOULD BE REPORTED IMMEDIATELY: *FEVER GREATER THAN 100.4 F (38 C) OR HIGHER *CHILLS OR SWEATING *NAUSEA AND VOMITING THAT IS NOT CONTROLLED WITH YOUR NAUSEA MEDICATION *UNUSUAL SHORTNESS OF BREATH *UNUSUAL BRUISING OR BLEEDING *URINARY PROBLEMS (pain or burning when urinating, or frequent urination) *BOWEL PROBLEMS (unusual diarrhea, constipation, pain near the anus) TENDERNESS IN MOUTH AND THROAT WITH OR WITHOUT PRESENCE OF ULCERS (sore throat, sores in mouth, or a toothache) UNUSUAL RASH, SWELLING OR PAIN  UNUSUAL VAGINAL DISCHARGE OR ITCHING   Items with * indicate a potential emergency and should be followed up as soon as possible or go to the Emergency Department if any problems should occur.  Please show the CHEMOTHERAPY ALERT CARD or IMMUNOTHERAPY  ALERT CARD at check-in to the Emergency Department and triage nurse. Should you have questions after your visit or need to cancel or reschedule your appointment, please contact [facility] [facility] - A DEPT OF [facility]  [phone] and follow the prompts.  Office hours are 8:00 a.m. to 4:30 p.m. Monday - Friday. Please note that voicemails left after 4:00 p.m. may not be returned until the following business day.  We are closed weekends and major holidays. You have access to a nurse at all times for urgent questions. Please call the main number to the clinic [phone] and follow the prompts.  For any non-urgent questions, you may also contact your provider using MyChart. We now offer e-Visits for anyone 53 and older to request care online for non-urgent symptoms. For details visit [email or web address].   Also download the MyChart app! Go to the app store, search "MyChart", open the app, select [place], and log in with your MyChart username and password.

## 2023-07-21 ENCOUNTER — Inpatient Hospital Stay: Payer: 59

## 2023-07-21 VITALS — BP 177/77 | HR 71 | Temp 98.2°F | Resp 16

## 2023-07-21 DIAGNOSIS — C3411 Malignant neoplasm of upper lobe, right bronchus or lung: Secondary | ICD-10-CM

## 2023-07-21 DIAGNOSIS — Z5111 Encounter for antineoplastic chemotherapy: Secondary | ICD-10-CM | POA: Diagnosis not present

## 2023-07-21 MED ORDER — PEGFILGRASTIM INJECTION 6 MG/0.6ML ~~LOC~~
6.0000 mg | PREFILLED_SYRINGE | Freq: Once | SUBCUTANEOUS | Status: AC
Start: 2023-07-21 — End: 2023-07-21
  Administered 2023-07-21: 6 mg via SUBCUTANEOUS
  Filled 2023-07-21: qty 0.6

## 2023-07-21 NOTE — Patient Instructions (Signed)

## 2023-07-24 ENCOUNTER — Telehealth: Payer: Self-pay

## 2023-07-24 NOTE — Telephone Encounter (Signed)
Received voicemail from patient stating that she needed a return phone call in regards to a sore in her mouth and being unsure what to do. This RN tried to call patient back two times. VM left for patient to call clinic back.

## 2023-07-24 NOTE — Telephone Encounter (Signed)
Pt called back and stated that she has one sore in her mouth roughly the size of a nickel. Pt states that when she spits water out she notices the water is blood tinged. Pt educated that she needs to do baking soda mouth washes. Pt educated that if the ulcer doesn't stop bleeding to go to the ER. Pt educated that if she gets more ulcers to let the office know and we can send in Magic Mouthwash for her. Pt states she is still able to eat and drink without pain. Pt denies any other concern. Pt verbalized understanding and had no further concerns.

## 2023-07-29 NOTE — Progress Notes (Signed)
 [facility] - Clinic Note  08/12/2023     CHIEF COMPLAINT Patient presents for Retina Follow Up   HISTORY OF PRESENT ILLNESS: Amber Cross is a 62 y.o. female who presents to the clinic today for:  HPI     Retina Follow Up   Patient presents with  Diabetic Retinopathy.  In both eyes.  This started 5 weeks ago.  I, the attending physician,  performed the HPI with the patient and updated documentation appropriately.        Comments   Patient here for 5 weeks retina follow up for NPDR OU. Patient states vision about the same. No eye pain.       Last edited by Valdemar Rogue, MD on 08/12/2023  5:01 PM.    Pt states she has an appt on January 27th for cataract eval with Dr. Octavia    Referring physician: Clem Brands, PA-C  [facility] [address] [place],  [place] [address]  HISTORICAL INFORMATION:  Selected notes from the MEDICAL RECORD NUMBER Referred by Clem Brands, PA-C for concern of CME / PDR LEE:  Ocular Hx- PMH-   CURRENT MEDICATIONS: No current outpatient medications on file. (Ophthalmic Drugs)   No current facility-administered medications for this visit. (Ophthalmic Drugs)   Current Outpatient Medications (Other)  Medication Sig   acetaminophen  (TYLENOL ) 500 MG tablet Take 1,000 mg by mouth every 6 (six) hours as needed for mild pain.   albuterol  (VENTOLIN  HFA) 108 (90 Base) MCG/ACT inhaler Inhale 2 puffs into the lungs every 6 (six) hours as needed for wheezing or shortness of breath.   amLODipine  (NORVASC ) 10 MG tablet Take 1 tablet (10 mg total) by mouth daily.   amlodipine -olmesartan  (AZOR ) 10-20 MG tablet Take 1 tablet by mouth at bedtime.   aspirin  EC 81 MG tablet Take 1 tablet (81 mg total) by mouth daily. Swallow whole.   atorvastatin  (LIPITOR ) 80 MG tablet Take 1 tablet (80 mg total) by mouth daily.   B Complex-C-Zn-Folic Acid (DIALYVITE 800 WITH ZINC) 0.8 MG TABS Take 1 tablet by mouth daily.    Blood Glucose Calibration (MYGLUCOHEALTH CONTROL) SOLN    calcium  acetate (PHOSLO) 667 MG tablet Take 667 mg by mouth 3 (three) times daily.   Calcium  Acetate 668 (169 Ca) MG TABS Take 1 tablet by mouth 3 (three) times daily.   carvedilol  (COREG ) 12.5 MG tablet Take 12.5 mg by mouth.   Continuous Glucose Sensor (DEXCOM G7 SENSOR) MISC Apply each device on your skin every 10 days   dexamethasone  (DECADRON ) 4 MG tablet Take 2 tabs by mouth starting day after last dose of etoposide  for one day only. Repeat every 21 days. Take with food.   dronabinol  (MARINOL ) 2.5 MG capsule Take 1 capsule (2.5 mg total) by mouth 2 (two) times daily before a meal.   furosemide  (LASIX ) 40 MG tablet Take 1 tablet (40 mg total) by mouth daily as needed (swelling in legs or weight gain).   gabapentin  (NEURONTIN ) 600 MG tablet Take 600 mg by mouth daily.   glucose blood (PRECISION QID TEST) test strip    hydrALAZINE  (APRESOLINE ) 50 MG tablet Take 1 tablet (50 mg total) by mouth every 8 (eight) hours.   Insulin  Pen Needle (UNIFINE PENTIPS) 32G X 4 MM MISC Use in the morning and at bedtime   Lancet Devices (ADJUSTABLE LANCING DEVICE) MISC    losartan  (COZAAR ) 25 MG tablet Take 1 tablet (25 mg total) by mouth  daily.   megestrol  (MEGACE  ES) 625 MG/5ML suspension Take 5 mLs (625 mg total) by mouth daily.   megestrol  (MEGACE ) 400 MG/10ML suspension Take 10 mLs (400 mg total) by mouth 2 (two) times daily.   ondansetron  (ZOFRAN ) 8 MG tablet Take 1 tablet (8 mg total) by mouth every 8 (eight) hours as needed for nausea, vomiting or refractory nausea / vomiting. Start on the third day after carboplatin .   pantoprazole  (PROTONIX ) 40 MG tablet Take 40 mg by mouth daily.   prochlorperazine  (COMPAZINE ) 10 MG tablet Take 1 tablet (10 mg total) by mouth every 6 (six) hours as needed for nausea or vomiting.   sevelamer  carbonate (RENVELA ) 800 MG tablet Take 800 mg by mouth 3 (three) times daily with meals.   traZODone  (DESYREL ) 50 MG  tablet Take 1 tablet (50 mg total) by mouth at bedtime.   Vitamin D , Ergocalciferol , (DRISDOL ) 1.25 MG (50000 UNIT) CAPS capsule Take 1 capsule (50,000 Units total) by mouth every 7 (seven) days.   No current facility-administered medications for this visit. (Other)   REVIEW OF SYSTEMS: ROS   Positive for: Musculoskeletal, Endocrine, Cardiovascular, Eyes Negative for: Constitutional, Gastrointestinal, Neurological, Skin, Genitourinary, HENT, Respiratory, Psychiatric, Allergic/Imm, Heme/Lymph Last edited by Orval Asberry RAMAN, COA on 08/12/2023 12:27 PM.       ALLERGIES Allergies  Allergen Reactions   Penicillins Itching and Other (See Comments)    redness   Strawberry (Diagnostic) Itching and Swelling   Tomato Itching and Swelling   PAST MEDICAL HISTORY 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   Postop check 08/05/2022   Pseudogout    Stroke (HCC) 10/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   Tamponade 06/18/2022   Tobacco abuse    Past Surgical History:  Procedure Laterality Date   ANTERIOR CRUCIATE LIGAMENT REPAIR Right    AV FISTULA PLACEMENT Left 07/01/2022   Procedure: LEFT ARTERIOVENOUS (AV) FISTULA CREATION;  Surgeon: Eliza Lonni RAMAN, MD;  Location: [facility] OR;  Service: Vascular;  Laterality: Left;  with regional block   BRONCHIAL BIOPSY  12/18/2021   Procedure: BRONCHIAL BIOPSIES;  Surgeon: Brenna Adine CROME, DO;  Location: [facility] ENDOSCOPY;  Service: Pulmonary;;   BRONCHIAL BRUSHINGS  12/18/2021   Procedure: BRONCHIAL BRUSHINGS;  Surgeon: Brenna Adine CROME,  DO;  Location: [facility] ENDOSCOPY;  Service: Pulmonary;;   BRONCHIAL NEEDLE ASPIRATION BIOPSY  12/18/2021   Procedure: BRONCHIAL NEEDLE ASPIRATION BIOPSIES;  Surgeon: Brenna Adine CROME, DO;  Location: [facility] ENDOSCOPY;  Service: Pulmonary;;   CATARACT EXTRACTION     CHEST TUBE INSERTION Right 06/18/2022   Procedure: CHEST TUBE INSERTION;  Surgeon: Obadiah Coy, MD;  Location: [facility] OR;  Service: Thoracic;  Laterality: Right;   EXCHANGE OF A DIALYSIS CATHETER N/A 08/08/2022   Procedure: EXCHANGE OF A TUNNELED DIALYSIS CATHETER;  Surgeon: Serene Gaile ORN, MD;  Location: [facility] OR;  Service: Vascular;  Laterality: N/A;   FIDUCIAL MARKER PLACEMENT  12/18/2021   Procedure: FIDUCIAL MARKER PLACEMENT;  Surgeon: Brenna Adine CROME, DO;  Location: [facility];  Service: Pulmonary;;   IR FLUORO GUIDE CV LINE RIGHT  06/26/2022   IR US  GUIDE VASC ACCESS RIGHT  06/26/2022   OVARY SURGERY     RIGHT OOPHORECTOMY Right    SMALL INTESTINE SURGERY     SUBXYPHOID PERICARDIAL WINDOW N/A 06/18/2022   Procedure: SUBXYPHOID PERICARDIAL WINDOW;  Surgeon: Obadiah Coy, MD;  Location: [facility] OR;  Service: Thoracic;  Laterality: N/A;   TEE WITHOUT CARDIOVERSION N/A 06/18/2022   Procedure: TRANSESOPHAGEAL ECHOCARDIOGRAM (TEE);  Surgeon: Obadiah Coy, MD;  Location: [facility] OR;  Service: Thoracic;  Laterality: N/A;   TUBAL LIGATION     VIDEO BRONCHOSCOPY WITH RADIAL ENDOBRONCHIAL ULTRASOUND  12/18/2021   Procedure: RADIAL ENDOBRONCHIAL ULTRASOUND;  Surgeon: Brenna Adine CROME, DO;  Location: [facility];  Service: Pulmonary;;   FAMILY HISTORY Family History  Problem Relation Age of Onset   Diabetes Mother    Hypertension Mother    Hypertension Father    SOCIAL HISTORY Social History   Tobacco Use   Smoking status: Every Day    Current packs/day: 0.50    Average packs/day: 0.5 packs/day for 40.0 years (20.0 ttl pk-yrs)    Types: Cigarettes   Smokeless tobacco: Never   Tobacco comments:    Cutting back 1/4-1/2 ppd  Vaping Use    Vaping status: Never Used  Substance Use Topics   Alcohol use: Not Currently   Drug use: Yes    Frequency: 4.0 times per week    Types: Marijuana    Comment: smokes every other day      OPHTHALMIC EXAM:  Base Eye Exam     Visual Acuity (Snellen - Linear)       Right Left   Dist cc 20/30 +2 20/100   Dist ph cc 20/20 20/70 -1    Correction: Glasses         Tonometry (Tonopen, 12:25 PM)       Right Left   Pressure 12 13         Pupils       Dark Light Shape React APD   Right 3 2 Round Brisk None   Left 3 2 Round Brisk None         Visual Fields (Counting fingers)       Left Right    Full Full         Extraocular Movement       Right Left    Full, Ortho Full, Ortho         Neuro/Psych     Oriented x3: Yes   Mood/Affect: Normal         Dilation     Both eyes: 1.0% Mydriacyl, 2.5% Phenylephrine  @ 12:25 PM           Slit Lamp and Fundus Exam     Slit Lamp Exam       Right Left   Lids/Lashes Dermatochalasis - upper lid, mild MGD Dermatochalasis - upper lid, mild MGD   Conjunctiva/Sclera nasal pingeucula, Melanosis nasal pingeucula, Melanosis   Cornea arcus, well healed cataract wound, nasal LRI, 2+ Punctate epithelial erosions arcus, 1+ Punctate epithelial erosions, trace tear film debris   Anterior Chamber deep and clear deep and clear   Iris Round and dilated Round and dilated   Lens PC IOL in good position 2+ Nuclear sclerosis with brunescence, 2-3+ Cortical cataract   Anterior Vitreous mild syneresis, Posterior vitreous detachment mild syneresis         Fundus Exam  Right Left   Disc Pink and Sharp, temporal pallor, temporal PPP Pink and Sharp, temporal pallor, temporal PPP, no NVD, +SVP   C/D Ratio 0.3 0.3   Macula Good foveal reflex, scattered MA, DBH and punctate exudates -- improved, cystic changes -- slightly improved, focal flame heme along ST arcades -- resolved good foveal reflex, dense central cluster of exudates  -- improved, scattered MA, DBH and central edema -- slightly improved   Vessels attenuated, Tortuous, no NV attenuated, Tortuous, no NV   Periphery Attached, scattered MA Attached, scattered MA           Refraction     Wearing Rx       Sphere Cylinder Axis   Right -2.50 +0.50 158   Left -3.50 +0.75 151           IMAGING AND PROCEDURES  Imaging and Procedures for 08/12/2023  OCT, Retina - OU - Both Eyes       Right Eye Quality was good. Central Foveal Thickness: 232. Progression has improved. Findings include normal foveal contour, no SRF, intraretinal hyper-reflective material, intraretinal fluid (persistent IRF / IRHM greatest temporal mac -- slightly improved).   Left Eye Quality was good. Central Foveal Thickness: 172. Progression has improved. Findings include no SRF, abnormal foveal contour, subretinal hyper-reflective material, intraretinal hyper-reflective material, intraretinal fluid, outer retinal atrophy (Mild interval improvement in IRF/IRHM greatest IT macula).   Notes *Images captured and stored on drive  Diagnosis / Impression:  DME OU OD: persistent IRF / IRHM greatest temporal mac -- slightly improved OS: Mild interval improvement in IRF/IRHM greatest IT macula  Clinical management:  See below  Abbreviations: NFP - Normal foveal profile. CME - cystoid macular edema. PED - pigment epithelial detachment. IRF - intraretinal fluid. SRF - subretinal fluid. EZ - ellipsoid zone. ERM - epiretinal membrane. ORA - outer retinal atrophy. ORT - outer retinal tubulation. SRHM - subretinal hyper-reflective material. IRHM - intraretinal hyper-reflective material      Intravitreal Injection, Pharmacologic Agent - OD - Right Eye       Time Out 08/12/2023. 12:53 PM. Confirmed correct patient, procedure, site, and patient consented.   Anesthesia Topical anesthesia was used. Anesthetic medications included Lidocaine  2%, Proparacaine 0.5%.   Procedure Preparation  included 5% betadine to ocular surface, eyelid speculum. A supplied (32g) needle was used.   Injection: 1.25 mg Bevacizumab  1.25mg /0.40ml   Route: Intravitreal, Site: Right Eye   NDC: H525437, Lot: [id number], Expiration date: 09/11/2023   Post-op Post injection exam found visual acuity of at least counting fingers. The patient tolerated the procedure well. There were no complications. The patient received written and verbal post procedure care education.      Intravitreal Injection, Pharmacologic Agent - OS - Left Eye       Time Out 08/12/2023. 12:53 PM. Confirmed correct patient, procedure, site, and patient consented.   Anesthesia Topical anesthesia was used. Anesthetic medications included Lidocaine  2%, Proparacaine 0.5%.   Procedure Preparation included 5% betadine to ocular surface, eyelid speculum. A (32g) needle was used.   Injection: 2 mg aflibercept  2 MG/0.05ML   Route: Intravitreal, Site: Left Eye   NDC: D2246706, Lot: [id number], Expiration date: 05/28/2024, Waste: 0 mL   Post-op Post injection exam found visual acuity of at least counting fingers. The patient tolerated the procedure well. There were no complications. The patient received written and verbal post procedure care education.           ASSESSMENT/PLAN:  1,2 Severe Non-proliferative diabetic retinopathy,  both eyes  - A1C 7.8 (08.13.24), 10.4 (05.09.24), 6.7 (11.30.23) - s/p IVA OD #1 (05.02.24), #2 (05.30.24), #3 (07.24.24), #4 (08.21.24), #5 (10.08.24), #6 (11.05.24), #7 (12.10.24) - s/p IVA OS #1 (04.04.24), #2 (05.02.24), #3 (05.30.24), #4 (07.24.24), #5 (08.21.24) -- IVA resistance OS - s/p IVE OS #1 (10.08.24), #2 (11.05.24), #3 (12.10.24) - BCVA OD 20/20 from 20/25, OS stable at 20/70 - exam shows scattered MA/DBH and exudates OU (OS > OD) - FA (04.04.24) shows leaking MA greatest posteriorly, no NV OU - OCT shows OD: persistent IRF / IRHM greatest temporal mac -- slightly improved; OS:  Mild interval improvement in IRF/IRHM greatest IT macula at 5 weeks - recommend IVA OD #8 and IVE OS #4 today, 1.14.25 for DME w/ f/u in 5 wks again - pt wishes to proceed - RBA of procedure discussed, questions answered - Eylea  approved with insurance - IVA informed consent obtained and signed, 04.04.24 (OU) - IVE informed consent obtained and signed, 10.08.24 (OU) - see procedure note - f/u in 5 wks -- DFE/OCT, possible injection  3,4. Hypertensive retinopathy OU - discussed importance of tight BP control - monitor  5. Mixed Cataract OS - The symptoms of cataract, surgical options, and treatments and risks were discussed with patient. - discussed diagnosis and progression - under the expert management of [facility] - pt wishes to have cataract surgery this year in 2024 - clear from a retina standpoint to proceed with cataract surgery when pt and surgeon are ready  - has appointment with [facility] on January 27th  6. Pseudophakia OD  - s/p CE/IOL OD (Dr. Octavia, 2017)  - IOL in good position, doing well  - monitor  Ophthalmic Meds Ordered this visit:  Meds ordered this encounter  Medications   aflibercept  (EYLEA ) SOLN 2 mg   Bevacizumab  (AVASTIN ) SOLN 1.25 mg    Return in about 5 weeks (around 09/16/2023) for f/u NPDR OU, DFE, OCT.  There are no Patient Instructions on file for this visit.  Explained the diagnoses, plan, and follow up with the patient and they expressed understanding.  Patient expressed understanding of the importance of proper follow up care.   This document serves as a record of services personally performed by Redell JUDITHANN Hans, MD, PhD. It was created on their behalf by Wanda GEANNIE Keens, COT an ophthalmic technician. The creation of this record is the provider's dictation and/or activities during the visit.    Electronically signed by:  Wanda GEANNIE Keens, COT  08/14/23 1:21 AM  This document serves as a record of services personally performed  by Redell JUDITHANN Hans, MD, PhD. It was created on their behalf by Alan PARAS. Delores, OA an ophthalmic technician. The creation of this record is the provider's dictation and/or activities during the visit.    Electronically signed by: Alan PARAS. Delores, OA 08/14/23 1:21 AM  Redell JUDITHANN Hans, M.D., Ph.D. Diseases & Surgery of the Retina and Vitreous [facility]  I have reviewed the above documentation for accuracy and completeness, and I agree with the above. Redell JUDITHANN Hans, M.D., Ph.D. 08/14/23 1:23 AM  Abbreviations: M myopia (nearsighted); A astigmatism; H hyperopia (farsighted); P presbyopia; Mrx spectacle prescription;  CTL contact lenses; OD right eye; OS left eye; OU both eyes  XT exotropia; ET esotropia; PEK punctate epithelial keratitis; PEE punctate epithelial erosions; DES dry eye syndrome; MGD meibomian gland dysfunction; ATs artificial tears; PFAT's preservative free artificial tears; NSC nuclear sclerotic cataract;  PSC posterior subcapsular cataract; ERM epi-retinal membrane; PVD posterior vitreous detachment; RD retinal detachment; DM diabetes mellitus; DR diabetic retinopathy; NPDR non-proliferative diabetic retinopathy; PDR proliferative diabetic retinopathy; CSME clinically significant macular edema; DME diabetic macular edema; dbh dot blot hemorrhages; CWS cotton wool spot; POAG primary open angle glaucoma; C/D cup-to-disc ratio; HVF humphrey visual field; GVF goldmann visual field; OCT optical coherence tomography; IOP intraocular pressure; BRVO Branch retinal vein occlusion; CRVO central retinal vein occlusion; CRAO central retinal artery occlusion; BRAO branch retinal artery occlusion; RT retinal tear; SB scleral buckle; PPV pars plana vitrectomy; VH Vitreous hemorrhage; PRP panretinal laser photocoagulation; IVK intravitreal kenalog; VMT vitreomacular traction; MH Macular hole;  NVD neovascularization of the disc; NVE neovascularization elsewhere; AREDS age related  eye disease study; ARMD age related macular degeneration; POAG primary open angle glaucoma; EBMD epithelial/anterior basement membrane dystrophy; ACIOL anterior chamber intraocular lens; IOL intraocular lens; PCIOL posterior chamber intraocular lens; Phaco/IOL phacoemulsification with intraocular lens placement; PRK photorefractive keratectomy; LASIK laser assisted in situ keratomileusis; HTN hypertension; DM diabetes mellitus; COPD chronic obstructive pulmonary disease

## 2023-07-30 DIAGNOSIS — Z992 Dependence on renal dialysis: Secondary | ICD-10-CM | POA: Diagnosis not present

## 2023-07-30 DIAGNOSIS — I129 Hypertensive chronic kidney disease with stage 1 through stage 4 chronic kidney disease, or unspecified chronic kidney disease: Secondary | ICD-10-CM | POA: Diagnosis not present

## 2023-07-30 DIAGNOSIS — N186 End stage renal disease: Secondary | ICD-10-CM | POA: Diagnosis not present

## 2023-08-01 DIAGNOSIS — N186 End stage renal disease: Secondary | ICD-10-CM | POA: Diagnosis not present

## 2023-08-01 DIAGNOSIS — N2581 Secondary hyperparathyroidism of renal origin: Secondary | ICD-10-CM | POA: Diagnosis not present

## 2023-08-01 DIAGNOSIS — Z992 Dependence on renal dialysis: Secondary | ICD-10-CM | POA: Diagnosis not present

## 2023-08-01 DIAGNOSIS — E1122 Type 2 diabetes mellitus with diabetic chronic kidney disease: Secondary | ICD-10-CM | POA: Diagnosis not present

## 2023-08-04 DIAGNOSIS — Z992 Dependence on renal dialysis: Secondary | ICD-10-CM | POA: Diagnosis not present

## 2023-08-04 DIAGNOSIS — N186 End stage renal disease: Secondary | ICD-10-CM | POA: Diagnosis not present

## 2023-08-04 DIAGNOSIS — E1122 Type 2 diabetes mellitus with diabetic chronic kidney disease: Secondary | ICD-10-CM | POA: Diagnosis not present

## 2023-08-04 DIAGNOSIS — N2581 Secondary hyperparathyroidism of renal origin: Secondary | ICD-10-CM | POA: Diagnosis not present

## 2023-08-05 ENCOUNTER — Encounter: Payer: Self-pay | Admitting: Hematology & Oncology

## 2023-08-06 ENCOUNTER — Encounter: Payer: Self-pay | Admitting: Hematology & Oncology

## 2023-08-06 DIAGNOSIS — Z992 Dependence on renal dialysis: Secondary | ICD-10-CM | POA: Diagnosis not present

## 2023-08-06 DIAGNOSIS — E1122 Type 2 diabetes mellitus with diabetic chronic kidney disease: Secondary | ICD-10-CM | POA: Diagnosis not present

## 2023-08-06 DIAGNOSIS — N2581 Secondary hyperparathyroidism of renal origin: Secondary | ICD-10-CM | POA: Diagnosis not present

## 2023-08-06 DIAGNOSIS — N186 End stage renal disease: Secondary | ICD-10-CM | POA: Diagnosis not present

## 2023-08-07 ENCOUNTER — Encounter: Payer: Self-pay | Admitting: Hematology & Oncology

## 2023-08-08 DIAGNOSIS — E1122 Type 2 diabetes mellitus with diabetic chronic kidney disease: Secondary | ICD-10-CM | POA: Diagnosis not present

## 2023-08-08 DIAGNOSIS — Z992 Dependence on renal dialysis: Secondary | ICD-10-CM | POA: Diagnosis not present

## 2023-08-08 DIAGNOSIS — N186 End stage renal disease: Secondary | ICD-10-CM | POA: Diagnosis not present

## 2023-08-08 DIAGNOSIS — N2581 Secondary hyperparathyroidism of renal origin: Secondary | ICD-10-CM | POA: Diagnosis not present

## 2023-08-11 DIAGNOSIS — N2581 Secondary hyperparathyroidism of renal origin: Secondary | ICD-10-CM | POA: Diagnosis not present

## 2023-08-11 DIAGNOSIS — N186 End stage renal disease: Secondary | ICD-10-CM | POA: Diagnosis not present

## 2023-08-11 DIAGNOSIS — Z992 Dependence on renal dialysis: Secondary | ICD-10-CM | POA: Diagnosis not present

## 2023-08-11 DIAGNOSIS — E1122 Type 2 diabetes mellitus with diabetic chronic kidney disease: Secondary | ICD-10-CM | POA: Diagnosis not present

## 2023-08-12 ENCOUNTER — Telehealth: Payer: Self-pay | Admitting: *Deleted

## 2023-08-12 ENCOUNTER — Encounter (INDEPENDENT_AMBULATORY_CARE_PROVIDER_SITE_OTHER): Payer: Self-pay | Admitting: Ophthalmology

## 2023-08-12 ENCOUNTER — Ambulatory Visit (INDEPENDENT_AMBULATORY_CARE_PROVIDER_SITE_OTHER): Payer: 59 | Admitting: Ophthalmology

## 2023-08-12 DIAGNOSIS — H25812 Combined forms of age-related cataract, left eye: Secondary | ICD-10-CM

## 2023-08-12 DIAGNOSIS — I10 Essential (primary) hypertension: Secondary | ICD-10-CM | POA: Diagnosis not present

## 2023-08-12 DIAGNOSIS — E113413 Type 2 diabetes mellitus with severe nonproliferative diabetic retinopathy with macular edema, bilateral: Secondary | ICD-10-CM

## 2023-08-12 DIAGNOSIS — H35033 Hypertensive retinopathy, bilateral: Secondary | ICD-10-CM | POA: Diagnosis not present

## 2023-08-12 DIAGNOSIS — Z961 Presence of intraocular lens: Secondary | ICD-10-CM

## 2023-08-12 DIAGNOSIS — Z794 Long term (current) use of insulin: Secondary | ICD-10-CM

## 2023-08-12 MED ORDER — BEVACIZUMAB CHEMO INJECTION 1.25MG/0.05ML SYRINGE FOR KALEIDOSCOPE
1.2500 mg | INTRAVITREAL | Status: AC | PRN
  Administered 2023-08-12: 1.25 mg via INTRAVITREAL

## 2023-08-12 MED ORDER — AFLIBERCEPT 2MG/0.05ML IZ SOLN FOR KALEIDOSCOPE
2.0000 mg | INTRAVITREAL | Status: AC | PRN
  Administered 2023-08-12: 2 mg via INTRAVITREAL

## 2023-08-12 NOTE — Telephone Encounter (Signed)
 Spoke with patient regarding her PCS -assessment was done 05-08-23 / she also stated she was approved for 80 hours per month. [facility] -[phone].

## 2023-08-13 ENCOUNTER — Other Ambulatory Visit: Payer: Self-pay

## 2023-08-13 DIAGNOSIS — E1122 Type 2 diabetes mellitus with diabetic chronic kidney disease: Secondary | ICD-10-CM | POA: Diagnosis not present

## 2023-08-13 DIAGNOSIS — Z992 Dependence on renal dialysis: Secondary | ICD-10-CM | POA: Diagnosis not present

## 2023-08-13 DIAGNOSIS — N186 End stage renal disease: Secondary | ICD-10-CM | POA: Diagnosis not present

## 2023-08-13 DIAGNOSIS — N2581 Secondary hyperparathyroidism of renal origin: Secondary | ICD-10-CM | POA: Diagnosis not present

## 2023-08-15 DIAGNOSIS — Z992 Dependence on renal dialysis: Secondary | ICD-10-CM | POA: Diagnosis not present

## 2023-08-15 DIAGNOSIS — E1122 Type 2 diabetes mellitus with diabetic chronic kidney disease: Secondary | ICD-10-CM | POA: Diagnosis not present

## 2023-08-15 DIAGNOSIS — N186 End stage renal disease: Secondary | ICD-10-CM | POA: Diagnosis not present

## 2023-08-15 DIAGNOSIS — N2581 Secondary hyperparathyroidism of renal origin: Secondary | ICD-10-CM | POA: Diagnosis not present

## 2023-08-16 DIAGNOSIS — N186 End stage renal disease: Secondary | ICD-10-CM | POA: Diagnosis not present

## 2023-08-16 DIAGNOSIS — R770 Abnormality of albumin: Secondary | ICD-10-CM | POA: Diagnosis not present

## 2023-08-18 DIAGNOSIS — N186 End stage renal disease: Secondary | ICD-10-CM | POA: Diagnosis not present

## 2023-08-18 DIAGNOSIS — N2581 Secondary hyperparathyroidism of renal origin: Secondary | ICD-10-CM | POA: Diagnosis not present

## 2023-08-18 DIAGNOSIS — E1122 Type 2 diabetes mellitus with diabetic chronic kidney disease: Secondary | ICD-10-CM | POA: Diagnosis not present

## 2023-08-18 DIAGNOSIS — Z992 Dependence on renal dialysis: Secondary | ICD-10-CM | POA: Diagnosis not present

## 2023-08-20 DIAGNOSIS — N186 End stage renal disease: Secondary | ICD-10-CM | POA: Diagnosis not present

## 2023-08-20 DIAGNOSIS — E1122 Type 2 diabetes mellitus with diabetic chronic kidney disease: Secondary | ICD-10-CM | POA: Diagnosis not present

## 2023-08-20 DIAGNOSIS — N2581 Secondary hyperparathyroidism of renal origin: Secondary | ICD-10-CM | POA: Diagnosis not present

## 2023-08-20 DIAGNOSIS — Z992 Dependence on renal dialysis: Secondary | ICD-10-CM | POA: Diagnosis not present

## 2023-08-22 DIAGNOSIS — N2581 Secondary hyperparathyroidism of renal origin: Secondary | ICD-10-CM | POA: Diagnosis not present

## 2023-08-22 DIAGNOSIS — N186 End stage renal disease: Secondary | ICD-10-CM | POA: Diagnosis not present

## 2023-08-22 DIAGNOSIS — Z992 Dependence on renal dialysis: Secondary | ICD-10-CM | POA: Diagnosis not present

## 2023-08-22 DIAGNOSIS — E1122 Type 2 diabetes mellitus with diabetic chronic kidney disease: Secondary | ICD-10-CM | POA: Diagnosis not present

## 2023-08-25 ENCOUNTER — Encounter: Payer: Self-pay | Admitting: Hematology & Oncology

## 2023-08-25 DIAGNOSIS — Z992 Dependence on renal dialysis: Secondary | ICD-10-CM | POA: Diagnosis not present

## 2023-08-25 DIAGNOSIS — N186 End stage renal disease: Secondary | ICD-10-CM | POA: Diagnosis not present

## 2023-08-25 DIAGNOSIS — E1122 Type 2 diabetes mellitus with diabetic chronic kidney disease: Secondary | ICD-10-CM | POA: Diagnosis not present

## 2023-08-25 DIAGNOSIS — N2581 Secondary hyperparathyroidism of renal origin: Secondary | ICD-10-CM | POA: Diagnosis not present

## 2023-08-27 ENCOUNTER — Inpatient Hospital Stay (HOSPITAL_BASED_OUTPATIENT_CLINIC_OR_DEPARTMENT_OTHER): Payer: 59 | Admitting: Medical Oncology

## 2023-08-27 ENCOUNTER — Inpatient Hospital Stay: Payer: 59

## 2023-08-27 ENCOUNTER — Encounter: Payer: Self-pay | Admitting: Medical Oncology

## 2023-08-27 ENCOUNTER — Other Ambulatory Visit: Payer: Self-pay

## 2023-08-27 ENCOUNTER — Inpatient Hospital Stay: Payer: 59 | Attending: Radiation Oncology

## 2023-08-27 ENCOUNTER — Encounter: Payer: Self-pay | Admitting: Hematology & Oncology

## 2023-08-27 VITALS — BP 169/63 | HR 98 | Temp 98.3°F | Resp 17 | Ht 67.0 in | Wt 114.0 lb

## 2023-08-27 DIAGNOSIS — Z5111 Encounter for antineoplastic chemotherapy: Secondary | ICD-10-CM

## 2023-08-27 DIAGNOSIS — C3411 Malignant neoplasm of upper lobe, right bronchus or lung: Secondary | ICD-10-CM | POA: Diagnosis not present

## 2023-08-27 DIAGNOSIS — I10 Essential (primary) hypertension: Secondary | ICD-10-CM | POA: Diagnosis not present

## 2023-08-27 DIAGNOSIS — Z5112 Encounter for antineoplastic immunotherapy: Secondary | ICD-10-CM | POA: Diagnosis not present

## 2023-08-27 DIAGNOSIS — Z992 Dependence on renal dialysis: Secondary | ICD-10-CM

## 2023-08-27 DIAGNOSIS — D631 Anemia in chronic kidney disease: Secondary | ICD-10-CM

## 2023-08-27 DIAGNOSIS — I5043 Acute on chronic combined systolic (congestive) and diastolic (congestive) heart failure: Secondary | ICD-10-CM

## 2023-08-27 DIAGNOSIS — N2581 Secondary hyperparathyroidism of renal origin: Secondary | ICD-10-CM | POA: Diagnosis not present

## 2023-08-27 DIAGNOSIS — E1122 Type 2 diabetes mellitus with diabetic chronic kidney disease: Secondary | ICD-10-CM | POA: Diagnosis not present

## 2023-08-27 DIAGNOSIS — R519 Headache, unspecified: Secondary | ICD-10-CM | POA: Insufficient documentation

## 2023-08-27 DIAGNOSIS — R042 Hemoptysis: Secondary | ICD-10-CM | POA: Diagnosis not present

## 2023-08-27 DIAGNOSIS — N186 End stage renal disease: Secondary | ICD-10-CM | POA: Diagnosis not present

## 2023-08-27 DIAGNOSIS — I110 Hypertensive heart disease with heart failure: Secondary | ICD-10-CM | POA: Insufficient documentation

## 2023-08-27 DIAGNOSIS — N189 Chronic kidney disease, unspecified: Secondary | ICD-10-CM | POA: Diagnosis not present

## 2023-08-27 DIAGNOSIS — G629 Polyneuropathy, unspecified: Secondary | ICD-10-CM | POA: Insufficient documentation

## 2023-08-27 LAB — CBC WITH DIFFERENTIAL (CANCER CENTER ONLY)
Abs Immature Granulocytes: 0.05 10*3/uL (ref 0.00–0.07)
Basophils Absolute: 0.1 10*3/uL (ref 0.0–0.1)
Basophils Relative: 1 %
Eosinophils Absolute: 0.2 10*3/uL (ref 0.0–0.5)
Eosinophils Relative: 1 %
HCT: 34.1 % — ABNORMAL LOW (ref 36.0–46.0)
Hemoglobin: 10.8 g/dL — ABNORMAL LOW (ref 12.0–15.0)
Immature Granulocytes: 1 %
Lymphocytes Relative: 9 %
Lymphs Abs: 0.9 10*3/uL (ref 0.7–4.0)
MCH: 31.8 pg (ref 26.0–34.0)
MCHC: 31.7 g/dL (ref 30.0–36.0)
MCV: 100.3 fL — ABNORMAL HIGH (ref 80.0–100.0)
Monocytes Absolute: 1.1 10*3/uL — ABNORMAL HIGH (ref 0.1–1.0)
Monocytes Relative: 11 %
Neutro Abs: 8.2 10*3/uL — ABNORMAL HIGH (ref 1.7–7.7)
Neutrophils Relative %: 77 %
Platelet Count: 289 10*3/uL (ref 150–400)
RBC: 3.4 MIL/uL — ABNORMAL LOW (ref 3.87–5.11)
RDW: 17.8 % — ABNORMAL HIGH (ref 11.5–15.5)
WBC Count: 10.5 10*3/uL (ref 4.0–10.5)
nRBC: 0 % (ref 0.0–0.2)

## 2023-08-27 LAB — CMP (CANCER CENTER ONLY)
ALT: 11 U/L (ref 0–44)
AST: 13 U/L — ABNORMAL LOW (ref 15–41)
Albumin: 4.4 g/dL (ref 3.5–5.0)
Alkaline Phosphatase: 92 U/L (ref 38–126)
Anion gap: 13 (ref 5–15)
BUN: 13 mg/dL (ref 8–23)
CO2: 33 mmol/L — ABNORMAL HIGH (ref 22–32)
Calcium: 9.5 mg/dL (ref 8.9–10.3)
Chloride: 93 mmol/L — ABNORMAL LOW (ref 98–111)
Creatinine: 2.38 mg/dL — ABNORMAL HIGH (ref 0.44–1.00)
GFR, Estimated: 23 mL/min — ABNORMAL LOW (ref >60)
Glucose, Bld: 145 mg/dL — ABNORMAL HIGH (ref 70–99)
Potassium: 3.3 mmol/L — ABNORMAL LOW (ref 3.5–5.1)
Sodium: 139 mmol/L (ref 135–145)
Total Bilirubin: 0.4 mg/dL (ref 0.0–1.2)
Total Protein: 7.3 g/dL (ref 6.5–8.1)

## 2023-08-27 LAB — LACTATE DEHYDROGENASE: LDH: 280 U/L — ABNORMAL HIGH (ref 98–192)

## 2023-08-27 MED ORDER — SODIUM CHLORIDE 0.9 % IV SOLN
150.0000 mg | Freq: Once | INTRAVENOUS | Status: AC
  Administered 2023-08-27: 150 mg via INTRAVENOUS
  Filled 2023-08-27: qty 150

## 2023-08-27 MED ORDER — SODIUM CHLORIDE 0.9 % IV SOLN
Freq: Once | INTRAVENOUS | Status: AC
Start: 2023-08-27 — End: 2023-08-27

## 2023-08-27 MED ORDER — SODIUM CHLORIDE 0.9 % IV SOLN
1200.0000 mg | Freq: Once | INTRAVENOUS | Status: AC
  Administered 2023-08-27: 1200 mg via INTRAVENOUS
  Filled 2023-08-27: qty 20

## 2023-08-27 MED ORDER — SODIUM CHLORIDE 0.9 % IV SOLN
223.0000 mg | Freq: Once | INTRAVENOUS | Status: AC
  Administered 2023-08-27: 220 mg via INTRAVENOUS
  Filled 2023-08-27: qty 22

## 2023-08-27 MED ORDER — DEXAMETHASONE SODIUM PHOSPHATE 10 MG/ML IJ SOLN
10.0000 mg | Freq: Once | INTRAMUSCULAR | Status: AC
  Administered 2023-08-27: 10 mg via INTRAVENOUS
  Filled 2023-08-27: qty 1

## 2023-08-27 MED ORDER — SODIUM CHLORIDE 0.9 % IV SOLN
80.0000 mg/m2 | Freq: Once | INTRAVENOUS | Status: AC
  Administered 2023-08-27: 124 mg via INTRAVENOUS
  Filled 2023-08-27: qty 6.2

## 2023-08-27 MED ORDER — PALONOSETRON HCL INJECTION 0.25 MG/5ML
0.2500 mg | Freq: Once | INTRAVENOUS | Status: AC
Start: 2023-08-27 — End: 2023-08-27
  Administered 2023-08-27: 0.25 mg via INTRAVENOUS
  Filled 2023-08-27: qty 5

## 2023-08-27 NOTE — Progress Notes (Signed)
Hematology and Oncology Follow Up Visit  Amber Cross [id number] [birth year] 63 y.o. 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   Today she reports that she is doing ok. Her only concern today is that she has been getting headaches. She reports that she has a history of migraines when she was younger but they went away for years. Over the past few months they have returned. They occur often and are very painful. She has also noted some blurry vision but works closely with her eye specialist and gets injections into her eyes for macular degeneration. She does not feel that they are related to her chemotherapy treatment.   She states that she is tolerating treatment with no side effects. Her chronic neuropathy is stable.   She has dialysis Monday-Wednesday-Friday. I spoke with Dr. Bonney Aid regarding her Mircera vs Retacrit. At this time he would like dialysis to continue managing and monitoring her Hgb levels.   Weight is down a bit. Appetite is good.  She is on Marinol.  She has had no problems with pain.  There has been no bleeding.  She has occasional bouts of mild hemoptysis.  She has had no diarrhea, abdominal pain, SOB, chest pains, peripheral edema, palpitations.   Overall, I would say that her performance status is probably ECOG 2.     Wt Readings from Last 3 Encounters:  08/27/23 114 lb (51.7 kg)  07/08/23 117 lb (53.1 kg)  05/27/23 112 lb (50.8 kg)     Medications:  Current Outpatient Medications:    acetaminophen (TYLENOL) 500 MG  tablet, Take 1,000 mg by mouth every 6 (six) hours as needed for mild pain., Disp: , Rfl:    albuterol (VENTOLIN HFA) 108 (90 Base) MCG/ACT inhaler, Inhale 2 puffs into the lungs every 6 (six) hours as needed for wheezing or shortness of breath., Disp: 6.7 g, Rfl: 2   amLODipine (NORVASC) 10 MG tablet, Take 1 tablet (10 mg total) by mouth daily., Disp: 90 tablet, Rfl: 3   amlodipine-olmesartan (AZOR) 10-20 MG tablet, Take 1 tablet by mouth at bedtime., Disp: , Rfl:    aspirin EC 81 MG tablet, Take 1 tablet (81 mg total) by mouth daily. Swallow whole., Disp: 30 tablet, Rfl: 11   atorvastatin (LIPITOR) 80 MG tablet, Take 1 tablet (80 mg total) by mouth daily., Disp: 90 tablet, Rfl: 3   B Complex-C-Zn-Folic Acid (DIALYVITE 800 WITH ZINC) 0.8 MG TABS, Take 1 tablet by mouth daily., Disp: , Rfl:    Blood Glucose Calibration (MYGLUCOHEALTH CONTROL) SOLN, , Disp: , Rfl:    calcium acetate (PHOSLO) 667 MG tablet, Take 667 mg by mouth 3 (three) times daily., Disp: , Rfl:    Calcium Acetate 668 (169 Ca) MG TABS, Take 1 tablet by mouth 3 (three) times daily., Disp: , Rfl:    carvedilol (COREG) 12.5 MG tablet, Take 12.5 mg by mouth., Disp: , Rfl:    Continuous Glucose Sensor (DEXCOM G7 SENSOR) MISC, Apply each device on your skin every 10 days, Disp: 3 each, Rfl: 2   dexamethasone (DECADRON) 4 MG tablet, Take 2 tabs by mouth starting  day after last dose of etoposide for one day only. Repeat every 21 days. Take with food., Disp: 8 tablet, Rfl: 0   dronabinol (MARINOL) 2.5 MG capsule, Take 1 capsule (2.5 mg total) by mouth 2 (two) times daily before a meal., Disp: 60 capsule, Rfl: 0   furosemide (LASIX) 40 MG tablet, Take 1 tablet (40 mg total) by mouth daily as needed (swelling in legs or weight gain)., Disp: 30 tablet, Rfl: 1   gabapentin (NEURONTIN) 600 MG tablet, Take 600 mg by mouth daily., Disp: , Rfl:    glucose blood (PRECISION QID TEST) test strip, , Disp: , Rfl:    hydrALAZINE (APRESOLINE) 50 MG tablet,  Take 1 tablet (50 mg total) by mouth every 8 (eight) hours., Disp: 180 tablet, Rfl: 3   Insulin Pen Needle (UNIFINE PENTIPS) 32G X 4 MM MISC, Use in the morning and at bedtime, Disp: 100 each, Rfl: 3   Lancet Devices (ADJUSTABLE LANCING DEVICE) MISC, , Disp: , Rfl:    losartan (COZAAR) 25 MG tablet, Take 1 tablet (25 mg total) by mouth daily., Disp: 30 tablet, Rfl: 11   megestrol (MEGACE ES) 625 MG/5ML suspension, Take 5 mLs (625 mg total) by mouth daily., Disp: 150 mL, Rfl: 4   megestrol (MEGACE) 400 MG/10ML suspension, Take 10 mLs (400 mg total) by mouth 2 (two) times daily., Disp: 480 mL, Rfl: 3   ondansetron (ZOFRAN) 8 MG tablet, Take 1 tablet (8 mg total) by mouth every 8 (eight) hours as needed for nausea, vomiting or refractory nausea / vomiting. Start on the third day after carboplatin., Disp: 30 tablet, Rfl: 1   pantoprazole (PROTONIX) 40 MG tablet, Take 40 mg by mouth daily., Disp: , Rfl:    prochlorperazine (COMPAZINE) 10 MG tablet, Take 1 tablet (10 mg total) by mouth every 6 (six) hours as needed for nausea or vomiting., Disp: 30 tablet, Rfl: 1   sevelamer carbonate (RENVELA) 800 MG tablet, Take 800 mg by mouth 3 (three) times daily with meals., Disp: , Rfl:    traZODone (DESYREL) 50 MG tablet, Take 1 tablet (50 mg total) by mouth at bedtime., Disp: 90 tablet, Rfl: 1   Vitamin D, Ergocalciferol, (DRISDOL) 1.25 MG (50000 UNIT) CAPS capsule, Take 1 capsule (50,000 Units total) by mouth every 7 (seven) days., Disp: 5 capsule, Rfl: 0  Allergies:  Allergies  Allergen Reactions   Penicillins Itching and Other (See Comments)    redness   Strawberry (Diagnostic) Itching and Swelling   Tomato Itching and Swelling    Past Medical History, Surgical history, Social history, and Family History were reviewed and updated.  Review of Systems: Review of Systems  Constitutional:  Positive for appetite change and unexpected weight change.  HENT:  Negative.    Eyes: Negative.   Respiratory:  Negative.    Cardiovascular: Negative.   Gastrointestinal: Negative.   Endocrine: Negative.   Genitourinary: Negative.    Musculoskeletal: Negative.   Skin: Negative.   Neurological:  Positive for headaches.  Hematological: Negative.   Psychiatric/Behavioral: Negative.      Physical Exam:  height is 5\' 7"  (1.702 m) and weight is 114 lb (51.7 kg). Her oral temperature is 98.3 F (36.8 C). Her blood pressure is 169/63 (abnormal) and her pulse is 98. Her respiration is 17 and oxygen saturation is 98%.   Wt Readings from Last 3 Encounters:  08/27/23 114 lb (51.7 kg)  07/08/23 117 lb (53.1 kg)  05/27/23 112 lb (50.8 kg)    Physical  Exam Vitals reviewed.  HENT:     Head: Normocephalic and atraumatic.  Eyes:     Pupils: Pupils are equal, round, and reactive to light.  Cardiovascular:     Rate and Rhythm: Normal rate and regular rhythm.     Heart sounds: Normal heart sounds.  Pulmonary:     Effort: Pulmonary effort is normal.     Breath sounds: Normal breath sounds.  Abdominal:     General: Bowel sounds are normal.     Palpations: Abdomen is soft.  Musculoskeletal:        General: No tenderness or deformity. Normal range of motion.     Cervical back: Normal range of motion.  Lymphadenopathy:     Cervical: No cervical adenopathy.  Skin:    General: Skin is warm and dry.     Findings: No erythema or rash.  Neurological:     Mental Status: She is alert and oriented to person, place, and time.  Psychiatric:        Behavior: Behavior normal.        Thought Content: Thought content normal.        Judgment: Judgment normal.     Lab Results  Component Value Date   WBC 10.5 08/27/2023   HGB 10.8 (L) 08/27/2023   HCT 34.1 (L) 08/27/2023   MCV 100.3 (H) 08/27/2023   PLT 289 08/27/2023     Chemistry      Component Value Date/Time   NA 139 07/15/2023 1025   NA 141 03/11/2023 1220   K 3.7 07/15/2023 1025   CL 96 (L) 07/15/2023 1025   CO2 29 07/15/2023 1025   BUN 24 (H)  07/15/2023 1025   BUN 26 03/11/2023 1220   CREATININE 4.87 (H) 07/15/2023 1025      Component Value Date/Time   CALCIUM 9.1 07/15/2023 1025   ALKPHOS 75 07/15/2023 1025   AST 10 (L) 07/15/2023 1025   ALT 9 07/15/2023 1025   BILITOT 0.3 07/15/2023 1025     Encounter Diagnoses  Name Primary?   Encounter for antineoplastic chemotherapy Yes   Encounter for antineoplastic immunotherapy    Essential hypertension    CHF NYHA class I, acute on chronic, combined (HCC)    Small cell lung cancer, right upper lobe (HCC)    New onset of headaches after age 47     Impression and Plan: Amber Cross is a very nice 63 year old African-American female.  She has history of limited stage small cell lung cancer.  Unfortunate, because of her overall health status, she really could not have combined radiation therapy and chemotherapy.  Today she had an EKG performed ahead of cycle 4. This was reviewed by Dr. Myna Hidalgo and found to be non-concerning. No cardiology follow up or medication adjustment recommended at this time.   In terms of her headaches we will obtain and MR of her brain to assess for any potential metastatic lesions. Currently she is using Goodie powers for relief which she can continue at this time.   She is also due for a PET scan per Dr. Myna Hidalgo, which I will order today.   PET scan to be performed in 2 weeks.  RTC 3 weeks Ennever only, labs (CBC w/, CMP), +_ chemotherapy   Rushie Chestnut, PA-C 1/29/202511:23 AM

## 2023-08-28 ENCOUNTER — Inpatient Hospital Stay: Payer: 59

## 2023-08-28 VITALS — BP 151/66 | HR 87 | Temp 98.0°F | Resp 18

## 2023-08-28 DIAGNOSIS — C3411 Malignant neoplasm of upper lobe, right bronchus or lung: Secondary | ICD-10-CM

## 2023-08-28 DIAGNOSIS — Z5112 Encounter for antineoplastic immunotherapy: Secondary | ICD-10-CM | POA: Diagnosis not present

## 2023-08-28 DIAGNOSIS — R042 Hemoptysis: Secondary | ICD-10-CM | POA: Diagnosis not present

## 2023-08-28 DIAGNOSIS — Z5111 Encounter for antineoplastic chemotherapy: Secondary | ICD-10-CM | POA: Diagnosis not present

## 2023-08-28 DIAGNOSIS — I5043 Acute on chronic combined systolic (congestive) and diastolic (congestive) heart failure: Secondary | ICD-10-CM | POA: Diagnosis not present

## 2023-08-28 DIAGNOSIS — R519 Headache, unspecified: Secondary | ICD-10-CM | POA: Diagnosis not present

## 2023-08-28 DIAGNOSIS — Z992 Dependence on renal dialysis: Secondary | ICD-10-CM | POA: Diagnosis not present

## 2023-08-28 DIAGNOSIS — G629 Polyneuropathy, unspecified: Secondary | ICD-10-CM | POA: Diagnosis not present

## 2023-08-28 DIAGNOSIS — N189 Chronic kidney disease, unspecified: Secondary | ICD-10-CM | POA: Diagnosis not present

## 2023-08-28 DIAGNOSIS — D631 Anemia in chronic kidney disease: Secondary | ICD-10-CM | POA: Diagnosis not present

## 2023-08-28 DIAGNOSIS — I110 Hypertensive heart disease with heart failure: Secondary | ICD-10-CM | POA: Diagnosis not present

## 2023-08-28 MED ORDER — SODIUM CHLORIDE 0.9 % IV SOLN
80.0000 mg/m2 | Freq: Once | INTRAVENOUS | Status: AC
  Administered 2023-08-28: 124 mg via INTRAVENOUS
  Filled 2023-08-28: qty 6.2

## 2023-08-28 MED ORDER — SODIUM CHLORIDE 0.9 % IV SOLN: Freq: Once | INTRAVENOUS | Status: AC

## 2023-08-28 MED ORDER — DEXAMETHASONE SODIUM PHOSPHATE 10 MG/ML IJ SOLN
10.0000 mg | Freq: Once | INTRAMUSCULAR | Status: AC
  Administered 2023-08-28: 10 mg via INTRAVENOUS
  Filled 2023-08-28: qty 1

## 2023-08-28 NOTE — Patient Instructions (Signed)
[facility]  Discharge Instructions: Thank you for choosing [facility] to provide your oncology and hematology care.   If you have a lab appointment with the Cancer Center, please go directly to the Cancer Center and check in at the registration area.  Wear comfortable clothing and clothing appropriate for easy access to any Portacath or PICC line.   We strive to give you quality time with your provider. You may need to reschedule your appointment if you arrive late (15 or more minutes).  Arriving late affects you and other patients whose appointments are after yours.  Also, if you miss three or more appointments without notifying the office, you may be dismissed from the clinic at the provider's discretion.      For prescription refill requests, have your pharmacy contact our office and allow 72 hours for refills to be completed.    Today you received the following chemotherapy and/or immunotherapy agents Etoposide       To help prevent nausea and vomiting after your treatment, we encourage you to take your nausea medication as directed.  BELOW ARE SYMPTOMS THAT SHOULD BE REPORTED IMMEDIATELY: *FEVER GREATER THAN 100.4 F (38 C) OR HIGHER *CHILLS OR SWEATING *NAUSEA AND VOMITING THAT IS NOT CONTROLLED WITH YOUR NAUSEA MEDICATION *UNUSUAL SHORTNESS OF BREATH *UNUSUAL BRUISING OR BLEEDING *URINARY PROBLEMS (pain or burning when urinating, or frequent urination) *BOWEL PROBLEMS (unusual diarrhea, constipation, pain near the anus) TENDERNESS IN MOUTH AND THROAT WITH OR WITHOUT PRESENCE OF ULCERS (sore throat, sores in mouth, or a toothache) UNUSUAL RASH, SWELLING OR PAIN  UNUSUAL VAGINAL DISCHARGE OR ITCHING   Items with * indicate a potential emergency and should be followed up as soon as possible or go to the Emergency Department if any problems should occur.  Please show the CHEMOTHERAPY ALERT CARD or IMMUNOTHERAPY  ALERT CARD at check-in to the Emergency Department and triage nurse. Should you have questions after your visit or need to cancel or reschedule your appointment, please contact [facility] - A DEPT OF [facility]  [phone] and follow the prompts.  Office hours are 8:00 a.m. to 4:30 p.m. Monday - Friday. Please note that voicemails left after 4:00 p.m. may not be returned until the following business day.  We are closed weekends and major holidays. You have access to a nurse at all times for urgent questions. Please call the main number to the clinic [phone] and follow the prompts.  For any non-urgent questions, you may also contact your provider using MyChart. We now offer e-Visits for anyone 13 and older to request care online for non-urgent symptoms. For details visit [email or web address].   Also download the MyChart app! Go to the app store, search "MyChart", open the app, select [place], and log in with your MyChart username and password.

## 2023-08-29 ENCOUNTER — Inpatient Hospital Stay: Payer: 59

## 2023-08-29 VITALS — BP 155/65 | HR 75 | Temp 98.0°F | Resp 18

## 2023-08-29 DIAGNOSIS — N2581 Secondary hyperparathyroidism of renal origin: Secondary | ICD-10-CM | POA: Diagnosis not present

## 2023-08-29 DIAGNOSIS — C3411 Malignant neoplasm of upper lobe, right bronchus or lung: Secondary | ICD-10-CM | POA: Diagnosis not present

## 2023-08-29 DIAGNOSIS — Z5111 Encounter for antineoplastic chemotherapy: Secondary | ICD-10-CM | POA: Diagnosis not present

## 2023-08-29 DIAGNOSIS — E1122 Type 2 diabetes mellitus with diabetic chronic kidney disease: Secondary | ICD-10-CM | POA: Diagnosis not present

## 2023-08-29 DIAGNOSIS — I110 Hypertensive heart disease with heart failure: Secondary | ICD-10-CM | POA: Diagnosis not present

## 2023-08-29 DIAGNOSIS — R042 Hemoptysis: Secondary | ICD-10-CM | POA: Diagnosis not present

## 2023-08-29 DIAGNOSIS — Z5112 Encounter for antineoplastic immunotherapy: Secondary | ICD-10-CM | POA: Diagnosis not present

## 2023-08-29 DIAGNOSIS — N186 End stage renal disease: Secondary | ICD-10-CM | POA: Diagnosis not present

## 2023-08-29 DIAGNOSIS — N189 Chronic kidney disease, unspecified: Secondary | ICD-10-CM | POA: Diagnosis not present

## 2023-08-29 DIAGNOSIS — D631 Anemia in chronic kidney disease: Secondary | ICD-10-CM | POA: Diagnosis not present

## 2023-08-29 DIAGNOSIS — R519 Headache, unspecified: Secondary | ICD-10-CM | POA: Diagnosis not present

## 2023-08-29 DIAGNOSIS — G629 Polyneuropathy, unspecified: Secondary | ICD-10-CM | POA: Diagnosis not present

## 2023-08-29 DIAGNOSIS — I5043 Acute on chronic combined systolic (congestive) and diastolic (congestive) heart failure: Secondary | ICD-10-CM | POA: Diagnosis not present

## 2023-08-29 DIAGNOSIS — Z992 Dependence on renal dialysis: Secondary | ICD-10-CM | POA: Diagnosis not present

## 2023-08-29 MED ORDER — SODIUM CHLORIDE 0.9 % IV SOLN
80.0000 mg/m2 | Freq: Once | INTRAVENOUS | Status: AC
  Administered 2023-08-29: 124 mg via INTRAVENOUS
  Filled 2023-08-29: qty 6.2

## 2023-08-29 MED ORDER — DEXAMETHASONE SODIUM PHOSPHATE 10 MG/ML IJ SOLN
10.0000 mg | Freq: Once | INTRAMUSCULAR | Status: AC
Start: 2023-08-29 — End: 2023-08-29
  Administered 2023-08-29: 10 mg via INTRAVENOUS
  Filled 2023-08-29: qty 1

## 2023-08-29 MED ORDER — SODIUM CHLORIDE 0.9 % IV SOLN: Freq: Once | INTRAVENOUS | Status: AC

## 2023-08-29 NOTE — Patient Instructions (Signed)
 [facility]  Discharge Instructions: Thank you for choosing [facility] to provide your oncology and hematology care.   If you have a lab appointment with the Cancer Center, please go directly to the Cancer Center and check in at the registration area.  Wear comfortable clothing and clothing appropriate for easy access to any Portacath or PICC line.   We strive to give you quality time with your provider. You may need to reschedule your appointment if you arrive late (15 or more minutes).  Arriving late affects you and other patients whose appointments are after yours.  Also, if you miss three or more appointments without notifying the office, you may be dismissed from the clinic at the provider's discretion.      For prescription refill requests, have your pharmacy contact our office and allow 72 hours for refills to be completed.    Today you received the following chemotherapy and/or immunotherapy agents [id number]    To help prevent nausea and vomiting after your treatment, we encourage you to take your nausea medication as directed.  BELOW ARE SYMPTOMS THAT SHOULD BE REPORTED IMMEDIATELY: *FEVER GREATER THAN 100.4 F (38 C) OR HIGHER *CHILLS OR SWEATING *NAUSEA AND VOMITING THAT IS NOT CONTROLLED WITH YOUR NAUSEA MEDICATION *UNUSUAL SHORTNESS OF BREATH *UNUSUAL BRUISING OR BLEEDING *URINARY PROBLEMS (pain or burning when urinating, or frequent urination) *BOWEL PROBLEMS (unusual diarrhea, constipation, pain near the anus) TENDERNESS IN MOUTH AND THROAT WITH OR WITHOUT PRESENCE OF ULCERS (sore throat, sores in mouth, or a toothache) UNUSUAL RASH, SWELLING OR PAIN  UNUSUAL VAGINAL DISCHARGE OR ITCHING   Items with * indicate a potential emergency and should be followed up as soon as possible or go to the Emergency Department if any problems should occur.  Please show the CHEMOTHERAPY ALERT CARD or IMMUNOTHERAPY ALERT  CARD at check-in to the Emergency Department and triage nurse. Should you have questions after your visit or need to cancel or reschedule your appointment, please contact [facility] - A DEPT OF [facility]  [phone] and follow the prompts.  Office hours are 8:00 a.m. to 4:30 p.m. Monday - Friday. Please note that voicemails left after 4:00 p.m. may not be returned until the following business day.  We are closed weekends and major holidays. You have access to a nurse at all times for urgent questions. Please call the main number to the clinic [phone] and follow the prompts.  For any non-urgent questions, you may also contact your provider using MyChart. We now offer e-Visits for anyone 4 and older to request care online for non-urgent symptoms. For details visit [email or web address].   Also download the MyChart app! Go to the app store, search "MyChart", open the app, select [place], and log in with your MyChart username and password.

## 2023-08-30 ENCOUNTER — Other Ambulatory Visit: Payer: Self-pay

## 2023-08-30 DIAGNOSIS — I129 Hypertensive chronic kidney disease with stage 1 through stage 4 chronic kidney disease, or unspecified chronic kidney disease: Secondary | ICD-10-CM | POA: Diagnosis not present

## 2023-08-30 DIAGNOSIS — Z992 Dependence on renal dialysis: Secondary | ICD-10-CM | POA: Diagnosis not present

## 2023-08-30 DIAGNOSIS — N186 End stage renal disease: Secondary | ICD-10-CM | POA: Diagnosis not present

## 2023-09-01 ENCOUNTER — Inpatient Hospital Stay: Payer: 59 | Attending: Radiation Oncology

## 2023-09-01 VITALS — BP 160/62 | HR 90 | Temp 97.8°F | Resp 22

## 2023-09-01 DIAGNOSIS — N186 End stage renal disease: Secondary | ICD-10-CM | POA: Diagnosis not present

## 2023-09-01 DIAGNOSIS — C3411 Malignant neoplasm of upper lobe, right bronchus or lung: Secondary | ICD-10-CM | POA: Diagnosis not present

## 2023-09-01 DIAGNOSIS — N2581 Secondary hyperparathyroidism of renal origin: Secondary | ICD-10-CM | POA: Diagnosis not present

## 2023-09-01 DIAGNOSIS — D631 Anemia in chronic kidney disease: Secondary | ICD-10-CM | POA: Insufficient documentation

## 2023-09-01 DIAGNOSIS — N184 Chronic kidney disease, stage 4 (severe): Secondary | ICD-10-CM | POA: Diagnosis not present

## 2023-09-01 DIAGNOSIS — Z5189 Encounter for other specified aftercare: Secondary | ICD-10-CM | POA: Insufficient documentation

## 2023-09-01 DIAGNOSIS — Z5112 Encounter for antineoplastic immunotherapy: Secondary | ICD-10-CM | POA: Insufficient documentation

## 2023-09-01 DIAGNOSIS — Z992 Dependence on renal dialysis: Secondary | ICD-10-CM | POA: Diagnosis not present

## 2023-09-01 DIAGNOSIS — F17210 Nicotine dependence, cigarettes, uncomplicated: Secondary | ICD-10-CM | POA: Insufficient documentation

## 2023-09-01 MED ORDER — PEGFILGRASTIM INJECTION 6 MG/0.6ML ~~LOC~~
6.0000 mg | PREFILLED_SYRINGE | Freq: Once | SUBCUTANEOUS | Status: AC
  Administered 2023-09-01: 6 mg via SUBCUTANEOUS
  Filled 2023-09-01: qty 0.6

## 2023-09-01 NOTE — Patient Instructions (Signed)

## 2023-09-03 DIAGNOSIS — N186 End stage renal disease: Secondary | ICD-10-CM | POA: Diagnosis not present

## 2023-09-03 DIAGNOSIS — D631 Anemia in chronic kidney disease: Secondary | ICD-10-CM | POA: Diagnosis not present

## 2023-09-03 DIAGNOSIS — N2581 Secondary hyperparathyroidism of renal origin: Secondary | ICD-10-CM | POA: Diagnosis not present

## 2023-09-03 DIAGNOSIS — Z992 Dependence on renal dialysis: Secondary | ICD-10-CM | POA: Diagnosis not present

## 2023-09-04 ENCOUNTER — Telehealth (HOSPITAL_BASED_OUTPATIENT_CLINIC_OR_DEPARTMENT_OTHER): Payer: Self-pay

## 2023-09-04 NOTE — Progress Notes (Signed)
[facility] - Clinic Note  09/16/2023     CHIEF COMPLAINT Patient presents for Retina Follow Up   HISTORY OF PRESENT ILLNESS: Amber Cross is a 63 y.o. female who presents to the clinic today for:  HPI     Retina Follow Up   Patient presents with  Diabetic Retinopathy.  In both eyes.  This started 5 weeks ago.  I, the attending physician,  performed the HPI with the patient and updated documentation appropriately.        Comments   Patient feels the vision is the same. She is having IOL sx OS 09/30/23 with Dr. Marchelle Gearing. She is not using eye drops. She does not check her blood sugar.       Last edited by Rennis Chris, MD on 09/16/2023  1:46 PM.    Pt states she is getting ready to have cataract sx OS on March 4  Referring physician: Alma Downs, PA-C  [facility] [address] [place],  [place] [address]  HISTORICAL INFORMATION:  Selected notes from the MEDICAL RECORD NUMBER Referred by Alma Downs, PA-C for concern of CME / PDR LEE:  Ocular Hx- PMH-   CURRENT MEDICATIONS: No current outpatient medications on file. (Ophthalmic Drugs)   No current facility-administered medications for this visit. (Ophthalmic Drugs)   Current Outpatient Medications (Other)  Medication Sig   acetaminophen (TYLENOL) 500 MG tablet Take 1,000 mg by mouth every 6 (six) hours as needed for mild pain.   albuterol (VENTOLIN HFA) 108 (90 Base) MCG/ACT inhaler Inhale 2 puffs into the lungs every 6 (six) hours as needed for wheezing or shortness of breath.   amLODipine (NORVASC) 10 MG tablet Take 1 tablet (10 mg total) by mouth daily.   amlodipine-olmesartan (AZOR) 10-20 MG tablet Take 1 tablet by mouth at bedtime.   aspirin EC 81 MG tablet Take 1 tablet (81 mg total) by mouth daily. Swallow whole.   B Complex-C-Zn-Folic Acid (DIALYVITE 800 WITH ZINC) 0.8 MG TABS Take 1 tablet by mouth daily.   Blood Glucose Calibration (MYGLUCOHEALTH  CONTROL) SOLN    calcium acetate (PHOSLO) 667 MG tablet Take 667 mg by mouth 3 (three) times daily.   Calcium Acetate 668 (169 Ca) MG TABS Take 1 tablet by mouth 3 (three) times daily.   carvedilol (COREG) 12.5 MG tablet Take 12.5 mg by mouth.   Continuous Glucose Sensor (DEXCOM G7 SENSOR) MISC Apply each device on your skin every 10 days   dexamethasone (DECADRON) 4 MG tablet Take 2 tabs by mouth starting day after last dose of etoposide for one day only. Repeat every 21 days. Take with food.   dronabinol (MARINOL) 2.5 MG capsule Take 1 capsule (2.5 mg total) by mouth 2 (two) times daily before a meal.   gabapentin (NEURONTIN) 600 MG tablet Take 1 tablet (600 mg total) by mouth daily.   glucose blood (PRECISION QID TEST) test strip    hydrALAZINE (APRESOLINE) 50 MG tablet Take 1 tablet (50 mg total) by mouth every 8 (eight) hours.   Insulin Pen Needle (UNIFINE PENTIPS) 32G X 4 MM MISC Use in the morning and at bedtime   Lancet Devices (ADJUSTABLE LANCING DEVICE) MISC    losartan (COZAAR) 25 MG tablet Take 1 tablet (25 mg total) by mouth daily.   megestrol (MEGACE ES) 625 MG/5ML suspension Take 5 mLs (625 mg total) by mouth daily.   megestrol (MEGACE) 400 MG/10ML suspension Take 10 mLs (400  mg total) by mouth 2 (two) times daily.   ondansetron (ZOFRAN) 8 MG tablet Take 1 tablet (8 mg total) by mouth every 8 (eight) hours as needed for nausea, vomiting or refractory nausea / vomiting. Start on the third day after carboplatin.   pantoprazole (PROTONIX) 40 MG tablet Take 40 mg by mouth daily.   prochlorperazine (COMPAZINE) 10 MG tablet Take 1 tablet (10 mg total) by mouth every 6 (six) hours as needed for nausea or vomiting.   sevelamer carbonate (RENVELA) 800 MG tablet Take 800 mg by mouth 3 (three) times daily with meals.   traZODone (DESYREL) 50 MG tablet Take 1 tablet (50 mg total) by mouth at bedtime.   Vitamin D, Ergocalciferol, (DRISDOL) 1.25 MG (50000 UNIT) CAPS capsule Take 1 capsule  (50,000 Units total) by mouth every 7 (seven) days.   atorvastatin (LIPITOR) 80 MG tablet Take 1 tablet (80 mg total) by mouth daily.   furosemide (LASIX) 40 MG tablet Take 1 tablet (40 mg total) by mouth daily as needed (swelling in legs or weight gain).   No current facility-administered medications for this visit. (Other)   REVIEW OF SYSTEMS: ROS   Positive for: Musculoskeletal, Endocrine, Cardiovascular, Eyes Negative for: Constitutional, Gastrointestinal, Neurological, Skin, Genitourinary, HENT, Respiratory, Psychiatric, Allergic/Imm, Heme/Lymph Last edited by Charlette Caffey, COT on 09/16/2023 12:42 PM.     ALLERGIES Allergies  Allergen Reactions   Penicillins Itching and Other (See Comments)    redness   Strawberry (Diagnostic) Itching and Swelling   Tomato Itching and Swelling   PAST MEDICAL HISTORY 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   Postop check 08/05/2022   Pseudogout    Stroke (HCC) 10/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   Tamponade 06/18/2022   Tobacco abuse    Past Surgical History:  Procedure Laterality Date   ANTERIOR CRUCIATE LIGAMENT REPAIR Right    AV FISTULA PLACEMENT Left 07/01/2022   Procedure: LEFT ARTERIOVENOUS (AV) FISTULA CREATION;  Surgeon: Chuck Hint, MD;  Location: [facility] OR;  Service: Vascular;  Laterality: Left;  with regional block   BRONCHIAL BIOPSY  12/18/2021   Procedure: BRONCHIAL BIOPSIES;  Surgeon: Josephine Igo, DO;  Location: [facility] ENDOSCOPY;  Service: Pulmonary;;   BRONCHIAL BRUSHINGS  12/18/2021    Procedure: BRONCHIAL BRUSHINGS;  Surgeon: Josephine Igo, DO;  Location: [facility];  Service: Pulmonary;;   BRONCHIAL NEEDLE ASPIRATION BIOPSY  12/18/2021   Procedure: BRONCHIAL NEEDLE ASPIRATION BIOPSIES;  Surgeon: Josephine Igo, DO;  Location: [facility];  Service: Pulmonary;;   CATARACT EXTRACTION     CHEST TUBE INSERTION Right 06/18/2022   Procedure: CHEST TUBE INSERTION;  Surgeon: Lovett Sox, MD;  Location: [facility] OR;  Service: Thoracic;  Laterality: Right;   EXCHANGE OF A DIALYSIS CATHETER N/A 08/08/2022   Procedure: EXCHANGE OF A TUNNELED DIALYSIS CATHETER;  Surgeon: Nada Libman, MD;  Location: [facility] OR;  Service: Vascular;  Laterality: N/A;   FIDUCIAL MARKER PLACEMENT  12/18/2021   Procedure:  FIDUCIAL MARKER PLACEMENT;  Surgeon: Josephine Igo, DO;  Location: [facility];  Service: Pulmonary;;   IR FLUORO GUIDE CV LINE RIGHT  06/26/2022   IR US GUIDE VASC ACCESS RIGHT  06/26/2022   OVARY SURGERY     RIGHT OOPHORECTOMY Right    SMALL INTESTINE SURGERY     SUBXYPHOID PERICARDIAL WINDOW N/A 06/18/2022   Procedure: SUBXYPHOID PERICARDIAL WINDOW;  Surgeon: Lovett Sox, MD;  Location: [facility] OR;  Service: Thoracic;  Laterality: N/A;   TEE WITHOUT CARDIOVERSION N/A 06/18/2022   Procedure: TRANSESOPHAGEAL ECHOCARDIOGRAM (TEE);  Surgeon: Lovett Sox, MD;  Location: [facility] OR;  Service: Thoracic;  Laterality: N/A;   TUBAL LIGATION     VIDEO BRONCHOSCOPY WITH RADIAL ENDOBRONCHIAL ULTRASOUND  12/18/2021   Procedure: RADIAL ENDOBRONCHIAL ULTRASOUND;  Surgeon: Josephine Igo, DO;  Location: [facility];  Service: Pulmonary;;   FAMILY HISTORY Family History  Problem Relation Age of Onset   Diabetes Mother    Hypertension Mother    Hypertension Father    SOCIAL HISTORY Social History   Tobacco Use   Smoking status: Every Day    Current packs/day: 0.50    Average packs/day: 0.5 packs/day for 40.0 years (20.0 ttl pk-yrs)    Types: Cigarettes   Smokeless tobacco: Never    Tobacco comments:    Cutting back 1/4-1/2 ppd  Vaping Use   Vaping status: Never Used  Substance Use Topics   Alcohol use: Not Currently   Drug use: Yes    Frequency: 4.0 times per week    Types: Marijuana    Comment: smokes every other day      OPHTHALMIC EXAM:  Base Eye Exam     Visual Acuity (Snellen - Linear)       Right Left   Dist cc 20/30 20/100   Dist ph cc 20/20 20/70    Correction: Glasses         Tonometry (Tonopen, 12:45 PM)       Right Left   Pressure 11 11         Pupils       Dark Light Shape React APD   Right 3 2 Round Brisk None   Left 3 2 Round Brisk None         Visual Fields       Left Right    Full Full         Extraocular Movement       Right Left    Ortho Ortho    -- -- --  --  --  -- -- --   -- -- --  --  --  -- -- --           Neuro/Psych     Oriented x3: Yes   Mood/Affect: Normal         Dilation     Both eyes: 1.0% Mydriacyl, 2.5% Phenylephrine @ 12:43 PM           Slit Lamp and Fundus Exam     Slit Lamp Exam       Right Left   Lids/Lashes Dermatochalasis - upper lid, mild MGD Dermatochalasis - upper lid, mild MGD   Conjunctiva/Sclera nasal pingeucula, Melanosis nasal pingeucula, Melanosis   Cornea arcus, well healed cataract wound, nasal LRI, 2+ Punctate epithelial erosions arcus, 1+ Punctate epithelial erosions, trace tear film debris   Anterior Chamber deep and clear deep and clear   Iris Round and dilated Round and dilated   Lens PC IOL  in good position 2+ Nuclear sclerosis with brunescence, 2-3+ Cortical cataract   Anterior Vitreous mild syneresis, Posterior vitreous detachment mild syneresis         Fundus Exam       Right Left   Disc Pink and Sharp, temporal pallor, temporal PPP Pink and Sharp, temporal pallor, temporal PPP, no NVD, +SVP   C/D Ratio 0.3 0.3   Macula Good foveal reflex, scattered MA, DBH and punctate exudates -- improved, cystic changes -- slightly improved good  foveal reflex, dense central cluster of exudates -- improved, scattered MA, DBH and central edema -- slightly improved   Vessels attenuated, Tortuous, no NV attenuated, Tortuous, no NV   Periphery Attached, scattered MA Attached, scattered MA           Refraction     Wearing Rx       Sphere Cylinder Axis   Right -2.50 +0.50 158   Left -3.50 +0.75 151           IMAGING AND PROCEDURES  Imaging and Procedures for 09/16/2023  OCT, Retina - OU - Both Eyes       Right Eye Quality was good. Central Foveal Thickness: 230. Progression has improved. Findings include normal foveal contour, no SRF, intraretinal hyper-reflective material, intraretinal fluid (persistent IRF / IRHM greatest temporal mac -- slightly improved).   Left Eye Quality was good. Central Foveal Thickness: 176. Progression has improved. Findings include no SRF, abnormal foveal contour, subretinal hyper-reflective material, intraretinal hyper-reflective material, intraretinal fluid, outer retinal atrophy (Mild interval improvement in IRF/IRHM greatest IT macula).   Notes *Images captured and stored on drive  Diagnosis / Impression:  DME OU OD: persistent IRF / IRHM greatest temporal mac -- slightly improved OS: Mild interval improvement in IRF/IRHM greatest IT macula  Clinical management:  See below  Abbreviations: NFP - Normal foveal profile. CME - cystoid macular edema. PED - pigment epithelial detachment. IRF - intraretinal fluid. SRF - subretinal fluid. EZ - ellipsoid zone. ERM - epiretinal membrane. ORA - outer retinal atrophy. ORT - outer retinal tubulation. SRHM - subretinal hyper-reflective material. IRHM - intraretinal hyper-reflective material      Intravitreal Injection, Pharmacologic Agent - OD - Right Eye       Time Out 09/16/2023. 1:18 PM. Confirmed correct patient, procedure, site, and patient consented.   Anesthesia Topical anesthesia was used. Anesthetic medications included Lidocaine  2%, Proparacaine 0.5%.   Procedure Preparation included 5% betadine to ocular surface, eyelid speculum. A (32g) needle was used.   Injection: 1.25 mg Bevacizumab 1.25mg /0.72ml   Route: Intravitreal, Site: Right Eye   NDC: P3213405, Lot: [id number], Expiration date: 10/16/2023   Post-op Post injection exam found visual acuity of at least counting fingers. The patient tolerated the procedure well. There were no complications. The patient received written and verbal post procedure care education.      Intravitreal Injection, Pharmacologic Agent - OS - Left Eye       Time Out 09/16/2023. 1:19 PM. Confirmed correct patient, procedure, site, and patient consented.   Anesthesia Topical anesthesia was used. Anesthetic medications included Lidocaine 2%, Proparacaine 0.5%.   Procedure Preparation included 5% betadine to ocular surface, eyelid speculum. A (32g) needle was used.   Injection: 2 mg aflibercept 2 MG/0.05ML   Route: Intravitreal, Site: Left Eye   NDC: L6038910, Lot: [id number], Expiration date: 12/25/2024, Waste: 0 mL   Post-op Post injection exam found visual acuity of at least counting fingers. The patient tolerated the procedure well. There were  no complications. The patient received written and verbal post procedure care education.           ASSESSMENT/PLAN:  1,2 Severe Non-proliferative diabetic retinopathy, both eyes  - A1C 7.8 (08.13.24), 10.4 (05.09.24), 6.7 (11.30.23) - s/p IVA OD #1 (05.02.24), #2 (05.30.24), #3 (07.24.24), #4 (08.21.24), #5 (10.08.24), #6 (11.05.24), #7 (12.10.24), #8 (01.14.25) - s/p IVA OS #1 (04.04.24), #2 (05.02.24), #3 (05.30.24), #4 (07.24.24), #5 (08.21.24) -- IVA resistance OS - s/p IVE OS #1 (10.08.24), #2 (11.05.24), #3 (12.10.24), #4 (01.14.25) - BCVA OD 20/20 from 20/25, OS stable at 20/70 - exam shows scattered MA/DBH and exudates OU (OS > OD) - FA (04.04.24) shows leaking MA greatest posteriorly, no NV OU - OCT shows OD:  persistent IRF / IRHM greatest temporal mac -- slightly improved; OS: Mild interval improvement in IRF/IRHM greatest IT macula at 5 weeks - recommend IVA OD #9 and IVE OS #5 today,02.18.25 for DME w/ f/u in 5 wks again - pt wishes to proceed - RBA of procedure discussed, questions answered - Eylea approved with insurance - IVA informed consent obtained and signed, 04.04.24 (OU) - IVE informed consent obtained and signed, 10.08.24 (OU) - see procedure note - f/u in 5 wks -- DFE/OCT, possible injection  3,4. Hypertensive retinopathy OU - discussed importance of tight BP control - monitor  5. Mixed Cataract OS - The symptoms of cataract, surgical options, and treatments and risks were discussed with patient. - discussed diagnosis and progression - under the expert management of [facility] - clear from a retina standpoint to proceed with cataract surgery when pt and surgeon are ready  - OS surgery scheduled for March 4th with Dr. Marchelle Gearing  6. Pseudophakia OD  - s/p CE/IOL OD (Dr. Dione Booze, 2017)  - IOL in good position, doing well  - monitor  Ophthalmic Meds Ordered this visit:  Meds ordered this encounter  Medications   aflibercept (EYLEA) SOLN 2 mg   Bevacizumab (AVASTIN) SOLN 1.25 mg    Return in about 5 weeks (around 10/21/2023) for f/u NPDR OU, DFE, OCT.  There are no Patient Instructions on file for this visit.  Explained the diagnoses, plan, and follow up with the patient and they expressed understanding.  Patient expressed understanding of the importance of proper follow up care.   This document serves as a record of services personally performed by Karie Chimera, MD, PhD. It was created on their behalf by Charlette Caffey, COT an ophthalmic technician. The creation of this record is the provider's dictation and/or activities during the visit.    Electronically signed by:  Charlette Caffey, COT  09/16/23 1:48 PM  This document serves as a record of services  personally performed by Karie Chimera, MD, PhD. It was created on their behalf by Glee Arvin. Manson Passey, OA an ophthalmic technician. The creation of this record is the provider's dictation and/or activities during the visit.    Electronically signed by: Glee Arvin. Manson Passey, OA 09/16/23 1:48 PM  Karie Chimera, M.D., Ph.D. Diseases & Surgery of the Retina and Vitreous [facility]  I have reviewed the above documentation for accuracy and completeness, and I agree with the above. Karie Chimera, M.D., Ph.D. 09/16/23 1:48 PM   Abbreviations: M myopia (nearsighted); A astigmatism; H hyperopia (farsighted); P presbyopia; Mrx spectacle prescription;  CTL contact lenses; OD right eye; OS left eye; OU both eyes  XT exotropia; ET esotropia; PEK punctate epithelial keratitis; PEE  punctate epithelial erosions; DES dry eye syndrome; MGD meibomian gland dysfunction; ATs artificial tears; PFAT's preservative free artificial tears; NSC nuclear sclerotic cataract; PSC posterior subcapsular cataract; ERM epi-retinal membrane; PVD posterior vitreous detachment; RD retinal detachment; DM diabetes mellitus; DR diabetic retinopathy; NPDR non-proliferative diabetic retinopathy; PDR proliferative diabetic retinopathy; CSME clinically significant macular edema; DME diabetic macular edema; dbh dot blot hemorrhages; CWS cotton wool spot; POAG primary open angle glaucoma; C/D cup-to-disc ratio; HVF humphrey visual field; GVF goldmann visual field; OCT optical coherence tomography; IOP intraocular pressure; BRVO Branch retinal vein occlusion; CRVO central retinal vein occlusion; CRAO central retinal artery occlusion; BRAO branch retinal artery occlusion; RT retinal tear; SB scleral buckle; PPV pars plana vitrectomy; VH Vitreous hemorrhage; PRP panretinal laser photocoagulation; IVK intravitreal kenalog; VMT vitreomacular traction; MH Macular hole;  NVD neovascularization of the disc; NVE neovascularization  elsewhere; AREDS age related eye disease study; ARMD age related macular degeneration; POAG primary open angle glaucoma; EBMD epithelial/anterior basement membrane dystrophy; ACIOL anterior chamber intraocular lens; IOL intraocular lens; PCIOL posterior chamber intraocular lens; Phaco/IOL phacoemulsification with intraocular lens placement; PRK photorefractive keratectomy; LASIK laser assisted in situ keratomileusis; HTN hypertension; DM diabetes mellitus; COPD chronic obstructive pulmonary disease

## 2023-09-05 ENCOUNTER — Telehealth (HOSPITAL_BASED_OUTPATIENT_CLINIC_OR_DEPARTMENT_OTHER): Payer: Self-pay

## 2023-09-05 DIAGNOSIS — Z992 Dependence on renal dialysis: Secondary | ICD-10-CM | POA: Diagnosis not present

## 2023-09-05 DIAGNOSIS — N186 End stage renal disease: Secondary | ICD-10-CM | POA: Diagnosis not present

## 2023-09-05 DIAGNOSIS — N2581 Secondary hyperparathyroidism of renal origin: Secondary | ICD-10-CM | POA: Diagnosis not present

## 2023-09-05 DIAGNOSIS — D631 Anemia in chronic kidney disease: Secondary | ICD-10-CM | POA: Diagnosis not present

## 2023-09-08 DIAGNOSIS — N2581 Secondary hyperparathyroidism of renal origin: Secondary | ICD-10-CM | POA: Diagnosis not present

## 2023-09-08 DIAGNOSIS — N186 End stage renal disease: Secondary | ICD-10-CM | POA: Diagnosis not present

## 2023-09-08 DIAGNOSIS — D631 Anemia in chronic kidney disease: Secondary | ICD-10-CM | POA: Diagnosis not present

## 2023-09-08 DIAGNOSIS — Z992 Dependence on renal dialysis: Secondary | ICD-10-CM | POA: Diagnosis not present

## 2023-09-09 ENCOUNTER — Encounter: Payer: Self-pay | Admitting: Hematology & Oncology

## 2023-09-09 DIAGNOSIS — Z961 Presence of intraocular lens: Secondary | ICD-10-CM | POA: Diagnosis not present

## 2023-09-09 DIAGNOSIS — Z794 Long term (current) use of insulin: Secondary | ICD-10-CM | POA: Diagnosis not present

## 2023-09-09 DIAGNOSIS — H25812 Combined forms of age-related cataract, left eye: Secondary | ICD-10-CM | POA: Diagnosis not present

## 2023-09-09 DIAGNOSIS — E113413 Type 2 diabetes mellitus with severe nonproliferative diabetic retinopathy with macular edema, bilateral: Secondary | ICD-10-CM | POA: Diagnosis not present

## 2023-09-09 DIAGNOSIS — H35033 Hypertensive retinopathy, bilateral: Secondary | ICD-10-CM | POA: Diagnosis not present

## 2023-09-10 ENCOUNTER — Telehealth: Payer: Self-pay

## 2023-09-10 DIAGNOSIS — N186 End stage renal disease: Secondary | ICD-10-CM | POA: Diagnosis not present

## 2023-09-10 DIAGNOSIS — N2581 Secondary hyperparathyroidism of renal origin: Secondary | ICD-10-CM | POA: Diagnosis not present

## 2023-09-10 DIAGNOSIS — Z992 Dependence on renal dialysis: Secondary | ICD-10-CM | POA: Diagnosis not present

## 2023-09-10 DIAGNOSIS — D631 Anemia in chronic kidney disease: Secondary | ICD-10-CM | POA: Diagnosis not present

## 2023-09-10 NOTE — Telephone Encounter (Signed)
Pt is requesting a call back she stated that  her pharmacist called her told her that her meds rx has expire and she is needing new ones

## 2023-09-10 NOTE — Telephone Encounter (Signed)
Return pt's call about her medications - no answer; left message to call the office.

## 2023-09-11 ENCOUNTER — Ambulatory Visit (HOSPITAL_COMMUNITY): Payer: 59

## 2023-09-11 ENCOUNTER — Other Ambulatory Visit: Payer: Self-pay

## 2023-09-11 DIAGNOSIS — I5022 Chronic systolic (congestive) heart failure: Secondary | ICD-10-CM

## 2023-09-11 DIAGNOSIS — N186 End stage renal disease: Secondary | ICD-10-CM

## 2023-09-11 MED ORDER — TRAZODONE HCL 50 MG PO TABS: 50.0000 mg | ORAL_TABLET | Freq: Every day | ORAL | 1 refills | Status: DC

## 2023-09-11 MED ORDER — GABAPENTIN 600 MG PO TABS: 600.0000 mg | ORAL_TABLET | Freq: Every day | ORAL | 3 refills | Status: DC

## 2023-09-12 DIAGNOSIS — Z992 Dependence on renal dialysis: Secondary | ICD-10-CM | POA: Diagnosis not present

## 2023-09-12 DIAGNOSIS — N2581 Secondary hyperparathyroidism of renal origin: Secondary | ICD-10-CM | POA: Diagnosis not present

## 2023-09-12 DIAGNOSIS — D631 Anemia in chronic kidney disease: Secondary | ICD-10-CM | POA: Diagnosis not present

## 2023-09-12 DIAGNOSIS — N186 End stage renal disease: Secondary | ICD-10-CM | POA: Diagnosis not present

## 2023-09-13 ENCOUNTER — Other Ambulatory Visit: Payer: Self-pay

## 2023-09-15 DIAGNOSIS — N186 End stage renal disease: Secondary | ICD-10-CM | POA: Diagnosis not present

## 2023-09-15 DIAGNOSIS — N2581 Secondary hyperparathyroidism of renal origin: Secondary | ICD-10-CM | POA: Diagnosis not present

## 2023-09-15 DIAGNOSIS — D631 Anemia in chronic kidney disease: Secondary | ICD-10-CM | POA: Diagnosis not present

## 2023-09-15 DIAGNOSIS — Z992 Dependence on renal dialysis: Secondary | ICD-10-CM | POA: Diagnosis not present

## 2023-09-16 ENCOUNTER — Ambulatory Visit (INDEPENDENT_AMBULATORY_CARE_PROVIDER_SITE_OTHER): Payer: 59 | Admitting: Ophthalmology

## 2023-09-16 ENCOUNTER — Encounter (INDEPENDENT_AMBULATORY_CARE_PROVIDER_SITE_OTHER): Payer: Self-pay | Admitting: Ophthalmology

## 2023-09-16 ENCOUNTER — Telehealth: Payer: Self-pay | Admitting: *Deleted

## 2023-09-16 DIAGNOSIS — I10 Essential (primary) hypertension: Secondary | ICD-10-CM

## 2023-09-16 DIAGNOSIS — Z961 Presence of intraocular lens: Secondary | ICD-10-CM

## 2023-09-16 DIAGNOSIS — H35033 Hypertensive retinopathy, bilateral: Secondary | ICD-10-CM

## 2023-09-16 DIAGNOSIS — H25812 Combined forms of age-related cataract, left eye: Secondary | ICD-10-CM | POA: Diagnosis not present

## 2023-09-16 DIAGNOSIS — Z794 Long term (current) use of insulin: Secondary | ICD-10-CM

## 2023-09-16 DIAGNOSIS — E113413 Type 2 diabetes mellitus with severe nonproliferative diabetic retinopathy with macular edema, bilateral: Secondary | ICD-10-CM

## 2023-09-16 MED ORDER — AFLIBERCEPT 2MG/0.05ML IZ SOLN FOR KALEIDOSCOPE
2.0000 mg | INTRAVITREAL | Status: AC | PRN
  Administered 2023-09-16: 2 mg via INTRAVITREAL

## 2023-09-16 MED ORDER — BEVACIZUMAB CHEMO INJECTION 1.25MG/0.05ML SYRINGE FOR KALEIDOSCOPE
1.2500 mg | INTRAVITREAL | Status: AC | PRN
  Administered 2023-09-16: 1.25 mg via INTRAVITREAL

## 2023-09-16 NOTE — Telephone Encounter (Signed)
RTC to patient has been on Gabapentin 300 mg a day. New prescription sent was for 600 mg a day.  Call to pharmacy-patient has picked up the prescription.  She will be able if needed to break the pill in half if needed.

## 2023-09-16 NOTE — Telephone Encounter (Signed)
Please call patient regarding how to take a prescription which the dosage is different from what she has been taking.

## 2023-09-17 ENCOUNTER — Other Ambulatory Visit: Payer: Self-pay

## 2023-09-17 ENCOUNTER — Inpatient Hospital Stay: Payer: 59

## 2023-09-17 ENCOUNTER — Ambulatory Visit: Payer: 59

## 2023-09-17 ENCOUNTER — Other Ambulatory Visit: Payer: 59

## 2023-09-17 ENCOUNTER — Ambulatory Visit: Payer: 59 | Admitting: Hematology & Oncology

## 2023-09-17 DIAGNOSIS — Z992 Dependence on renal dialysis: Secondary | ICD-10-CM | POA: Diagnosis not present

## 2023-09-17 DIAGNOSIS — D631 Anemia in chronic kidney disease: Secondary | ICD-10-CM | POA: Diagnosis not present

## 2023-09-17 DIAGNOSIS — N186 End stage renal disease: Secondary | ICD-10-CM | POA: Diagnosis not present

## 2023-09-17 DIAGNOSIS — N2581 Secondary hyperparathyroidism of renal origin: Secondary | ICD-10-CM | POA: Diagnosis not present

## 2023-09-18 ENCOUNTER — Ambulatory Visit (HOSPITAL_COMMUNITY): Payer: 59

## 2023-09-19 DIAGNOSIS — N186 End stage renal disease: Secondary | ICD-10-CM | POA: Diagnosis not present

## 2023-09-19 DIAGNOSIS — D631 Anemia in chronic kidney disease: Secondary | ICD-10-CM | POA: Diagnosis not present

## 2023-09-19 DIAGNOSIS — Z992 Dependence on renal dialysis: Secondary | ICD-10-CM | POA: Diagnosis not present

## 2023-09-19 DIAGNOSIS — N2581 Secondary hyperparathyroidism of renal origin: Secondary | ICD-10-CM | POA: Diagnosis not present

## 2023-09-22 DIAGNOSIS — N2581 Secondary hyperparathyroidism of renal origin: Secondary | ICD-10-CM | POA: Diagnosis not present

## 2023-09-22 DIAGNOSIS — D631 Anemia in chronic kidney disease: Secondary | ICD-10-CM | POA: Diagnosis not present

## 2023-09-22 DIAGNOSIS — N186 End stage renal disease: Secondary | ICD-10-CM | POA: Diagnosis not present

## 2023-09-22 DIAGNOSIS — Z992 Dependence on renal dialysis: Secondary | ICD-10-CM | POA: Diagnosis not present

## 2023-09-23 ENCOUNTER — Inpatient Hospital Stay: Payer: 59

## 2023-09-23 ENCOUNTER — Inpatient Hospital Stay (HOSPITAL_BASED_OUTPATIENT_CLINIC_OR_DEPARTMENT_OTHER): Payer: 59 | Admitting: Hematology & Oncology

## 2023-09-23 ENCOUNTER — Encounter: Payer: Self-pay | Admitting: Hematology & Oncology

## 2023-09-23 VITALS — BP 132/53 | HR 93 | Temp 98.2°F | Resp 18 | Ht 67.0 in | Wt 121.8 lb

## 2023-09-23 DIAGNOSIS — C3411 Malignant neoplasm of upper lobe, right bronchus or lung: Secondary | ICD-10-CM

## 2023-09-23 DIAGNOSIS — D631 Anemia in chronic kidney disease: Secondary | ICD-10-CM

## 2023-09-23 DIAGNOSIS — Z5189 Encounter for other specified aftercare: Secondary | ICD-10-CM | POA: Diagnosis not present

## 2023-09-23 DIAGNOSIS — N186 End stage renal disease: Secondary | ICD-10-CM

## 2023-09-23 DIAGNOSIS — Z992 Dependence on renal dialysis: Secondary | ICD-10-CM | POA: Diagnosis not present

## 2023-09-23 DIAGNOSIS — Z5112 Encounter for antineoplastic immunotherapy: Secondary | ICD-10-CM | POA: Diagnosis not present

## 2023-09-23 DIAGNOSIS — F17210 Nicotine dependence, cigarettes, uncomplicated: Secondary | ICD-10-CM | POA: Diagnosis not present

## 2023-09-23 DIAGNOSIS — N189 Chronic kidney disease, unspecified: Secondary | ICD-10-CM | POA: Diagnosis not present

## 2023-09-23 DIAGNOSIS — C3491 Malignant neoplasm of unspecified part of right bronchus or lung: Secondary | ICD-10-CM

## 2023-09-23 DIAGNOSIS — N184 Chronic kidney disease, stage 4 (severe): Secondary | ICD-10-CM | POA: Diagnosis not present

## 2023-09-23 LAB — CBC WITH DIFFERENTIAL (CANCER CENTER ONLY)
Abs Immature Granulocytes: 0.47 10*3/uL — ABNORMAL HIGH (ref 0.00–0.07)
Basophils Absolute: 0.1 10*3/uL (ref 0.0–0.1)
Basophils Relative: 0 %
Eosinophils Absolute: 0 10*3/uL (ref 0.0–0.5)
Eosinophils Relative: 0 %
HCT: 26.5 % — ABNORMAL LOW (ref 36.0–46.0)
Hemoglobin: 8.3 g/dL — ABNORMAL LOW (ref 12.0–15.0)
Immature Granulocytes: 3 %
Lymphocytes Relative: 7 %
Lymphs Abs: 1.2 10*3/uL (ref 0.7–4.0)
MCH: 32.2 pg (ref 26.0–34.0)
MCHC: 31.3 g/dL (ref 30.0–36.0)
MCV: 102.7 fL — ABNORMAL HIGH (ref 80.0–100.0)
Monocytes Absolute: 1.4 10*3/uL — ABNORMAL HIGH (ref 0.1–1.0)
Monocytes Relative: 9 %
Neutro Abs: 13 10*3/uL — ABNORMAL HIGH (ref 1.7–7.7)
Neutrophils Relative %: 81 %
Platelet Count: 373 10*3/uL (ref 150–400)
RBC: 2.58 MIL/uL — ABNORMAL LOW (ref 3.87–5.11)
RDW: 19 % — ABNORMAL HIGH (ref 11.5–15.5)
WBC Count: 16.2 10*3/uL — ABNORMAL HIGH (ref 4.0–10.5)
nRBC: 0.7 % — ABNORMAL HIGH (ref 0.0–0.2)

## 2023-09-23 LAB — CMP (CANCER CENTER ONLY)
ALT: 25 U/L (ref 0–44)
AST: 16 U/L (ref 15–41)
Albumin: 3.8 g/dL (ref 3.5–5.0)
Alkaline Phosphatase: 96 U/L (ref 38–126)
Anion gap: 14 (ref 5–15)
BUN: 32 mg/dL — ABNORMAL HIGH (ref 8–23)
CO2: 29 mmol/L (ref 22–32)
Calcium: 9.1 mg/dL (ref 8.9–10.3)
Chloride: 98 mmol/L (ref 98–111)
Creatinine: 5.23 mg/dL — ABNORMAL HIGH (ref 0.44–1.00)
GFR, Estimated: 9 mL/min — ABNORMAL LOW (ref >60)
Glucose, Bld: 279 mg/dL — ABNORMAL HIGH (ref 70–99)
Potassium: 3.8 mmol/L (ref 3.5–5.1)
Sodium: 141 mmol/L (ref 135–145)
Total Bilirubin: 0.2 mg/dL (ref 0.0–1.2)
Total Protein: 6.4 g/dL — ABNORMAL LOW (ref 6.5–8.1)

## 2023-09-23 MED ORDER — SODIUM CHLORIDE 0.9 % IV SOLN
1200.0000 mg | Freq: Once | INTRAVENOUS | Status: AC
  Administered 2023-09-23: 1200 mg via INTRAVENOUS
  Filled 2023-09-23: qty 20

## 2023-09-23 MED ORDER — DARBEPOETIN ALFA 300 MCG/0.6ML IJ SOSY
300.0000 ug | PREFILLED_SYRINGE | Freq: Once | INTRAMUSCULAR | Status: AC
  Administered 2023-09-23: 300 ug via SUBCUTANEOUS
  Filled 2023-09-23: qty 0.6

## 2023-09-23 MED ORDER — SODIUM CHLORIDE 0.9 % IV SOLN: Freq: Once | INTRAVENOUS | Status: AC

## 2023-09-23 NOTE — Progress Notes (Signed)
 Ok to treat with creatinine of 5.23 per Dr Myna Hidalgo. dph

## 2023-09-23 NOTE — Progress Notes (Signed)
 Hematology and Oncology Follow Up Visit  Dwan Fennel [id number] 09-26-1960 63 y.o. 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 Hofstra is back for follow-up.  Overall, I think she is doing fairly well.  She still gets her dialysis.  She is not yet had her PET scan.  She does not want to have it done in [place].  She has had no problems with cough.  She is still smoking half pack a day.  She has had no problems with nausea or vomiting.  There is been no change in bowel or bladder habits.  Again, she gets her dialysis.  She has had no issues with bleeding.  There is been no rashes.  She has had no leg swelling.  Overall, I would say that her performance status is probably ECOG 2.       Wt Readings from Last 3 Encounters:  09/23/23 121 lb 12.8 oz (55.2 kg)  08/27/23 114 lb (51.7 kg)  07/08/23 117 lb (53.1 kg)     Medications:  Current Outpatient Medications:    amLODipine (NORVASC) 10 MG tablet, Take 1 tablet (10 mg total) by mouth daily., Disp: 90 tablet, Rfl: 3   amlodipine-olmesartan (AZOR) 10-20 MG tablet, Take 1 tablet by mouth at bedtime., Disp: , Rfl:    aspirin EC 81 MG tablet, Take 1 tablet (81 mg total) by mouth daily. Swallow whole., Disp: 30 tablet, Rfl: 11   atorvastatin (LIPITOR) 80 MG tablet, Take 1 tablet (80 mg total) by mouth daily., Disp: 90 tablet, Rfl: 3   B Complex-C-Zn-Folic Acid (DIALYVITE 800 WITH ZINC) 0.8 MG TABS, Take 1 tablet by mouth daily., Disp: , Rfl:    Blood Glucose Calibration (MYGLUCOHEALTH CONTROL) SOLN, , Disp: , Rfl:    calcium  acetate (PHOSLO) 667 MG tablet, Take 667 mg by mouth 3 (three) times daily., Disp: , Rfl:    Calcium Acetate 668 (169 Ca) MG TABS, Take 1 tablet by mouth 3 (three) times daily., Disp: , Rfl:    carvedilol (COREG) 12.5 MG tablet, Take 12.5 mg by mouth., Disp: , Rfl:    Continuous Glucose Sensor (DEXCOM G7 SENSOR) MISC, Apply each device on your skin every 10 days, Disp: 3 each, Rfl: 2   dexamethasone (DECADRON) 4 MG tablet, Take 2 tabs by mouth starting day after last dose of etoposide for one day only. Repeat every 21 days. Take with food., Disp: 8 tablet, Rfl: 0   dronabinol (MARINOL) 2.5 MG capsule, Take 1 capsule (2.5 mg total) by mouth 2 (two) times daily before a meal., Disp: 60 capsule, Rfl: 0   gabapentin (NEURONTIN) 600 MG tablet, Take 1 tablet (600 mg total) by mouth daily., Disp: 30 tablet, Rfl: 3   glucose blood (PRECISION QID TEST) test strip, , Disp: , Rfl:    hydrALAZINE (APRESOLINE) 50 MG tablet, Take 1 tablet (50 mg total) by mouth every 8 (eight) hours., Disp: 180 tablet, Rfl: 3   Insulin Pen Needle (UNIFINE PENTIPS) 32G X 4 MM MISC, Use in the morning and at bedtime, Disp: 100 each, Rfl: 3   Lancet Devices (ADJUSTABLE LANCING DEVICE) MISC, ,  Disp: , Rfl:    losartan (COZAAR) 25 MG tablet, Take 1 tablet (25 mg total) by mouth daily., Disp: 30 tablet, Rfl: 11   megestrol (MEGACE ES) 625 MG/5ML suspension, Take 5 mLs (625 mg total) by mouth daily., Disp: 150 mL, Rfl: 4   pantoprazole (PROTONIX) 40 MG tablet, Take 40 mg by mouth daily., Disp: , Rfl:    sevelamer carbonate (RENVELA) 800 MG tablet, Take 800 mg by mouth 3 (three) times daily with meals., Disp: , Rfl:    traZODone (DESYREL) 50 MG tablet, Take 1 tablet (50 mg total) by mouth at bedtime., Disp: 90 tablet, Rfl: 1   Vitamin D, Ergocalciferol, (DRISDOL) 1.25 MG (50000 UNIT) CAPS capsule, Take 1 capsule (50,000 Units total) by mouth every 7 (seven) days., Disp: 5 capsule, Rfl: 0   acetaminophen (TYLENOL) 500 MG tablet, Take  1,000 mg by mouth every 6 (six) hours as needed for mild pain. (Patient not taking: Reported on 09/23/2023), Disp: , Rfl:    albuterol (VENTOLIN HFA) 108 (90 Base) MCG/ACT inhaler, Inhale 2 puffs into the lungs every 6 (six) hours as needed for wheezing or shortness of breath. (Patient not taking: Reported on 09/23/2023), Disp: 6.7 g, Rfl: 2   furosemide (LASIX) 40 MG tablet, Take 1 tablet (40 mg total) by mouth daily as needed (swelling in legs or weight gain)., Disp: 30 tablet, Rfl: 1   ondansetron (ZOFRAN) 8 MG tablet, Take 1 tablet (8 mg total) by mouth every 8 (eight) hours as needed for nausea, vomiting or refractory nausea / vomiting. Start on the third day after carboplatin. (Patient not taking: Reported on 09/23/2023), Disp: 30 tablet, Rfl: 1   prochlorperazine (COMPAZINE) 10 MG tablet, Take 1 tablet (10 mg total) by mouth every 6 (six) hours as needed for nausea or vomiting. (Patient not taking: Reported on 09/23/2023), Disp: 30 tablet, Rfl: 1  Allergies:  Allergies  Allergen Reactions   Penicillins Itching and Other (See Comments)    redness   Strawberry (Diagnostic) Itching and Swelling   Tomato Itching and Swelling    Past Medical History, Surgical history, Social history, and Family History were reviewed and updated.  Review of Systems: Review of Systems  Constitutional:  Positive for appetite change and unexpected weight change.  HENT:  Negative.    Eyes: Negative.   Respiratory: Negative.    Cardiovascular: Negative.   Gastrointestinal: Negative.   Endocrine: Negative.   Genitourinary: Negative.    Musculoskeletal: Negative.   Skin: Negative.   Neurological:  Positive for headaches.  Hematological: Negative.   Psychiatric/Behavioral: Negative.      Physical Exam:  height is 5\' 7"  (1.702 m) and weight is 121 lb 12.8 oz (55.2 kg). Her oral temperature is 98.2 F (36.8 C). Her respiration is 18.   Wt Readings from Last 3 Encounters:  09/23/23 121 lb 12.8 oz (55.2 kg)   08/27/23 114 lb (51.7 kg)  07/08/23 117 lb (53.1 kg)    Physical Exam Vitals reviewed.  HENT:     Head: Normocephalic and atraumatic.  Eyes:     Pupils: Pupils are equal, round, and reactive to light.  Cardiovascular:     Rate and Rhythm: Normal rate and regular rhythm.     Heart sounds: Normal heart sounds.  Pulmonary:     Effort: Pulmonary effort is normal.     Breath sounds: Normal breath sounds.  Abdominal:     General: Bowel sounds are normal.     Palpations: Abdomen is soft.  Musculoskeletal:        General: No tenderness or deformity. Normal range of motion.     Cervical back: Normal range of motion.  Lymphadenopathy:     Cervical: No cervical adenopathy.  Skin:    General: Skin is warm and dry.     Findings: No erythema or rash.  Neurological:     Mental Status: She is alert and oriented to person, place, and time.  Psychiatric:        Behavior: Behavior normal.        Thought Content: Thought content normal.        Judgment: Judgment normal.     Lab Results  Component Value Date   WBC 16.2 (H) 09/23/2023   HGB 8.3 (L) 09/23/2023   HCT 26.5 (L) 09/23/2023   MCV 102.7 (H) 09/23/2023   PLT 373 09/23/2023     Chemistry      Component Value Date/Time   NA 141 09/23/2023 1058   NA 141 03/11/2023 1220   K 3.8 09/23/2023 1058   CL 98 09/23/2023 1058   CO2 29 09/23/2023 1058   BUN 32 (H) 09/23/2023 1058   BUN 26 03/11/2023 1220   CREATININE 5.23 (H) 09/23/2023 1058      Component Value Date/Time   CALCIUM 9.1 09/23/2023 1058   ALKPHOS 96 09/23/2023 1058   AST 16 09/23/2023 1058   ALT 25 09/23/2023 1058   BILITOT 0.2 09/23/2023 1058        Impression and Plan: Ms. Gilden is a very nice 63 year old African-American female.  She has history of limited stage small cell lung cancer.  Unfortunately, because of her overall health status, she really could not have combined radiation therapy and chemotherapy.  We will maintain her on Tecentriq right  now.  I will need to see what her PET scan looks like.  This will give me an idea as to whether or not she is going need any kind of radiotherapy.  I hate that she is still smoking.  I think this is better in the real problem down the road.  We will see about trying to get the PET scan in a week or so.  Again, we have to get this at a [facility].  It will be a whole lot easier for her.   Josph Macho, MD 2/25/202511:50 AM

## 2023-09-24 DIAGNOSIS — N2581 Secondary hyperparathyroidism of renal origin: Secondary | ICD-10-CM | POA: Diagnosis not present

## 2023-09-24 DIAGNOSIS — Z992 Dependence on renal dialysis: Secondary | ICD-10-CM | POA: Diagnosis not present

## 2023-09-24 DIAGNOSIS — D631 Anemia in chronic kidney disease: Secondary | ICD-10-CM | POA: Diagnosis not present

## 2023-09-24 DIAGNOSIS — N186 End stage renal disease: Secondary | ICD-10-CM | POA: Diagnosis not present

## 2023-09-25 ENCOUNTER — Other Ambulatory Visit: Payer: Self-pay

## 2023-09-26 DIAGNOSIS — D631 Anemia in chronic kidney disease: Secondary | ICD-10-CM | POA: Diagnosis not present

## 2023-09-26 DIAGNOSIS — N186 End stage renal disease: Secondary | ICD-10-CM | POA: Diagnosis not present

## 2023-09-26 DIAGNOSIS — N2581 Secondary hyperparathyroidism of renal origin: Secondary | ICD-10-CM | POA: Diagnosis not present

## 2023-09-26 DIAGNOSIS — Z992 Dependence on renal dialysis: Secondary | ICD-10-CM | POA: Diagnosis not present

## 2023-09-27 DIAGNOSIS — I129 Hypertensive chronic kidney disease with stage 1 through stage 4 chronic kidney disease, or unspecified chronic kidney disease: Secondary | ICD-10-CM | POA: Diagnosis not present

## 2023-09-27 DIAGNOSIS — Z992 Dependence on renal dialysis: Secondary | ICD-10-CM | POA: Diagnosis not present

## 2023-09-27 DIAGNOSIS — N186 End stage renal disease: Secondary | ICD-10-CM | POA: Diagnosis not present

## 2023-09-29 DIAGNOSIS — N2581 Secondary hyperparathyroidism of renal origin: Secondary | ICD-10-CM | POA: Diagnosis not present

## 2023-09-29 DIAGNOSIS — N186 End stage renal disease: Secondary | ICD-10-CM | POA: Diagnosis not present

## 2023-09-29 DIAGNOSIS — Z992 Dependence on renal dialysis: Secondary | ICD-10-CM | POA: Diagnosis not present

## 2023-09-30 DIAGNOSIS — H25812 Combined forms of age-related cataract, left eye: Secondary | ICD-10-CM | POA: Diagnosis not present

## 2023-10-01 DIAGNOSIS — Z992 Dependence on renal dialysis: Secondary | ICD-10-CM | POA: Diagnosis not present

## 2023-10-01 DIAGNOSIS — N2581 Secondary hyperparathyroidism of renal origin: Secondary | ICD-10-CM | POA: Diagnosis not present

## 2023-10-01 DIAGNOSIS — N186 End stage renal disease: Secondary | ICD-10-CM | POA: Diagnosis not present

## 2023-10-02 ENCOUNTER — Other Ambulatory Visit: Payer: Self-pay

## 2023-10-03 ENCOUNTER — Telehealth: Payer: Self-pay | Admitting: Medical Oncology

## 2023-10-03 DIAGNOSIS — N2581 Secondary hyperparathyroidism of renal origin: Secondary | ICD-10-CM | POA: Diagnosis not present

## 2023-10-03 DIAGNOSIS — N186 End stage renal disease: Secondary | ICD-10-CM | POA: Diagnosis not present

## 2023-10-03 DIAGNOSIS — Z992 Dependence on renal dialysis: Secondary | ICD-10-CM | POA: Diagnosis not present

## 2023-10-03 NOTE — Telephone Encounter (Signed)
 Patient contacted the office regarding a PET scan that was ordered to be done at [facility]. She stated she was advised by our office to contact them directly to schedule the scan. She stated that when she called them to do that, that they advised her that our office needed to be the one's to contact them with the order. She called back advising [place] to do that and after speaking with the desk nurse she was informed by our office to get a fax number from their office so that we were able to fax them the order. She than proceeded to be very rude and cursing at me demanding to speak with the charge nurse as well as Dr Myna Hidalgo. I did advise the patient that both Dr Myna Hidalgo were busy with patient's that I could transfer her to the nurse line and to leave a detailed message and someone would call her back as soon as they were able. She was not happy about that and began cursing at me again and told me she couldn't wait to advise Dr Myna Hidalgo of our office and then ended the phone call.

## 2023-10-06 DIAGNOSIS — N186 End stage renal disease: Secondary | ICD-10-CM | POA: Diagnosis not present

## 2023-10-06 DIAGNOSIS — N2581 Secondary hyperparathyroidism of renal origin: Secondary | ICD-10-CM | POA: Diagnosis not present

## 2023-10-06 DIAGNOSIS — Z992 Dependence on renal dialysis: Secondary | ICD-10-CM | POA: Diagnosis not present

## 2023-10-08 DIAGNOSIS — C3490 Malignant neoplasm of unspecified part of unspecified bronchus or lung: Secondary | ICD-10-CM | POA: Diagnosis not present

## 2023-10-08 DIAGNOSIS — Z79899 Other long term (current) drug therapy: Secondary | ICD-10-CM | POA: Diagnosis not present

## 2023-10-08 DIAGNOSIS — C3411 Malignant neoplasm of upper lobe, right bronchus or lung: Secondary | ICD-10-CM | POA: Diagnosis not present

## 2023-10-08 DIAGNOSIS — N2581 Secondary hyperparathyroidism of renal origin: Secondary | ICD-10-CM | POA: Diagnosis not present

## 2023-10-08 DIAGNOSIS — R918 Other nonspecific abnormal finding of lung field: Secondary | ICD-10-CM | POA: Diagnosis not present

## 2023-10-08 DIAGNOSIS — Z992 Dependence on renal dialysis: Secondary | ICD-10-CM | POA: Diagnosis not present

## 2023-10-08 DIAGNOSIS — N186 End stage renal disease: Secondary | ICD-10-CM | POA: Diagnosis not present

## 2023-10-09 DIAGNOSIS — N2581 Secondary hyperparathyroidism of renal origin: Secondary | ICD-10-CM | POA: Diagnosis not present

## 2023-10-09 DIAGNOSIS — N186 End stage renal disease: Secondary | ICD-10-CM | POA: Diagnosis not present

## 2023-10-09 DIAGNOSIS — Z992 Dependence on renal dialysis: Secondary | ICD-10-CM | POA: Diagnosis not present

## 2023-10-09 NOTE — Progress Notes (Addendum)
 [facility] - Clinic Note  10/21/2023     CHIEF COMPLAINT Patient presents for Retina Follow Up   HISTORY OF PRESENT ILLNESS: Amber Cross is a 63 y.o. female who presents to the clinic today for:  HPI     Retina Follow Up   Patient presents with  Diabetic Retinopathy.  In both eyes.  Duration of 5 weeks.  Since onset it is stable.  I, the attending physician,  performed the HPI with the patient and updated documentation appropriately.        Comments   Pt states she has noticed some improvement in her vision but she is still seeing black spots her left eye. Pt had cataract extraction OS 09/30/23 with Dr. Marchelle Gearing. Pt is on tapering on tapering schedule with prednisolone and this week she starts tid in the OS. Pt states she does not know her last A1c and  does not monitor her blood sugar.  Outside medication recon show rx for ketorolac but pt states she is only using the bottle with the pink lid that starts with "pre"      Last edited by Rennis Chris, MD on 10/21/2023  5:40 PM.    Pt had cataract sx OS with Dr. Dione Booze on March 4th, she sees him again on the 10th, she states her vision has improved since the sx  Referring physician: Alma Downs, PA-C  [facility] [address] [place],  [place] [address]  HISTORICAL INFORMATION:  Selected notes from the MEDICAL RECORD NUMBER Referred by Alma Downs, PA-C for concern of CME / PDR LEE:  Ocular Hx- PMH-   CURRENT MEDICATIONS: Current Outpatient Medications (Ophthalmic Drugs)  Medication Sig   prednisoLONE acetate (PRED FORTE) 1 % ophthalmic suspension Place 1 drop into the left eye 4 (four) times daily.   No current facility-administered medications for this visit. (Ophthalmic Drugs)   Current Outpatient Medications (Other)  Medication Sig   amLODipine (NORVASC) 10 MG tablet Take 1 tablet (10 mg total) by mouth daily.   amlodipine-olmesartan (AZOR) 10-20 MG tablet  Take 1 tablet by mouth at bedtime.   aspirin EC 81 MG tablet Take 1 tablet (81 mg total) by mouth daily. Swallow whole.   atorvastatin (LIPITOR) 80 MG tablet Take 1 tablet (80 mg total) by mouth daily.   B Complex-C-Zn-Folic Acid (DIALYVITE 800 WITH ZINC) 0.8 MG TABS Take 1 tablet by mouth daily.   calcium acetate (PHOSLO) 667 MG tablet Take 667 mg by mouth 3 (three) times daily.   Calcium Acetate 668 (169 Ca) MG TABS Take 1 tablet by mouth 3 (three) times daily.   carvedilol (COREG) 12.5 MG tablet Take 1 tablet (12.5 mg total) by mouth daily.   dexamethasone (DECADRON) 4 MG tablet Take 2 tabs by mouth starting day after last dose of etoposide for one day only. Repeat every 21 days. Take with food.   dronabinol (MARINOL) 2.5 MG capsule Take 1 capsule (2.5 mg total) by mouth 2 (two) times daily before a meal.   gabapentin (NEURONTIN) 600 MG tablet Take 1 tablet (600 mg total) by mouth daily.   hydrALAZINE (APRESOLINE) 50 MG tablet Take 1 tablet (50 mg total) by mouth every 8 (eight) hours.   Insulin Pen Needle (UNIFINE PENTIPS) 32G X 4 MM MISC Use in the morning and at bedtime   losartan (COZAAR) 25 MG tablet Take 1 tablet (25 mg total) by mouth daily.   megestrol (MEGACE ES) 625  MG/5ML suspension Take 5 mLs (625 mg total) by mouth daily.   pantoprazole (PROTONIX) 40 MG tablet Take 40 mg by mouth daily.   sevelamer carbonate (RENVELA) 800 MG tablet Take 800 mg by mouth 3 (three) times daily with meals.   traZODone (DESYREL) 50 MG tablet Take 1 tablet (50 mg total) by mouth at bedtime.   Vitamin D, Ergocalciferol, (DRISDOL) 1.25 MG (50000 UNIT) CAPS capsule Take 1 capsule (50,000 Units total) by mouth every 7 (seven) days.   acetaminophen (TYLENOL) 500 MG tablet Take 1,000 mg by mouth every 6 (six) hours as needed for mild pain. (Patient not taking: Reported on 09/23/2023)   albuterol (VENTOLIN HFA) 108 (90 Base) MCG/ACT inhaler Inhale 2 puffs into the lungs every 6 (six) hours as needed for wheezing  or shortness of breath. (Patient not taking: Reported on 10/21/2023)   Blood Glucose Calibration (MYGLUCOHEALTH CONTROL) SOLN  (Patient not taking: Reported on 10/21/2023)   Continuous Glucose Sensor (DEXCOM G7 SENSOR) MISC Apply each device on your skin every 10 days (Patient not taking: Reported on 10/21/2023)   furosemide (LASIX) 40 MG tablet Take 1 tablet (40 mg total) by mouth daily as needed (swelling in legs or weight gain).   glucose blood (PRECISION QID TEST) test strip  (Patient not taking: Reported on 10/21/2023)   Lancet Devices (ADJUSTABLE LANCING DEVICE) MISC    ondansetron (ZOFRAN) 8 MG tablet Take 1 tablet (8 mg total) by mouth every 8 (eight) hours as needed for nausea, vomiting or refractory nausea / vomiting. Start on the third day after carboplatin. (Patient not taking: Reported on 09/23/2023)   prochlorperazine (COMPAZINE) 10 MG tablet Take 1 tablet (10 mg total) by mouth every 6 (six) hours as needed for nausea or vomiting. (Patient not taking: Reported on 09/23/2023)   No current facility-administered medications for this visit. (Other)   REVIEW OF SYSTEMS: ROS   Positive for: Musculoskeletal, Endocrine, Cardiovascular, Eyes Negative for: Constitutional, Gastrointestinal, Neurological, Skin, Genitourinary, HENT, Respiratory, Psychiatric, Allergic/Imm, Heme/Lymph Last edited by Elicia Lamp, COT on 10/21/2023 12:35 PM.      ALLERGIES Allergies  Allergen Reactions   Penicillins Itching and Other (See Comments)    redness   Strawberry (Diagnostic) Itching and Swelling   Tomato Itching and Swelling   PAST MEDICAL HISTORY Past Medical History:  Diagnosis Date   Anemia    Anemia of chronic renal failure, stage 4 (severe) (HCC) 07/08/2023   Arthritis    Chest pain 05/07/2021   CHF (congestive heart failure) (HCC)    Chronic kidney disease    dialysis M-W-F   Depression    Diabetes mellitus    Headache    Hypercholesteremia    Hypertension    Hypertensive  emergency 06/16/2022   Lung cancer (HCC) 12/18/2021   RUL small cell carcinoma, s/p radiation   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   Postop check 08/05/2022   Pseudogout    Stroke (HCC) 10/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   Tamponade 06/18/2022   Tobacco abuse    Past Surgical History:  Procedure Laterality Date   ANTERIOR CRUCIATE LIGAMENT REPAIR Right    AV FISTULA PLACEMENT Left 07/01/2022   Procedure: LEFT ARTERIOVENOUS (AV) FISTULA CREATION;  Surgeon: Chuck Hint, MD;  Location: [facility] OR;  Service: Vascular;  Laterality: Left;  with regional block   BRONCHIAL BIOPSY  12/18/2021   Procedure: BRONCHIAL BIOPSIES;  Surgeon: Josephine Igo, DO;  Location: [facility];  Service: Pulmonary;;   BRONCHIAL BRUSHINGS  12/18/2021   Procedure: BRONCHIAL BRUSHINGS;  Surgeon: Josephine Igo, DO;  Location: [facility];  Service: Pulmonary;;   BRONCHIAL NEEDLE ASPIRATION BIOPSY  12/18/2021   Procedure: BRONCHIAL NEEDLE ASPIRATION BIOPSIES;  Surgeon: Josephine Igo, DO;  Location: [facility];  Service: Pulmonary;;   CATARACT EXTRACTION     CHEST TUBE INSERTION Right 06/18/2022   Procedure: CHEST TUBE INSERTION;  Surgeon: Lovett Sox, MD;  Location: [facility] OR;  Service: Thoracic;  Laterality: Right;   EXCHANGE OF A DIALYSIS CATHETER N/A 08/08/2022   Procedure: EXCHANGE OF A TUNNELED DIALYSIS CATHETER;  Surgeon: Nada Libman, MD;  Location: [facility] OR;  Service: Vascular;  Laterality: N/A;   FIDUCIAL MARKER PLACEMENT  12/18/2021   Procedure: FIDUCIAL MARKER PLACEMENT;  Surgeon: Josephine Igo, DO;  Location: [facility];  Service: Pulmonary;;   IR FLUORO GUIDE CV LINE RIGHT  06/26/2022   IR US GUIDE VASC ACCESS RIGHT  06/26/2022   OVARY SURGERY     RIGHT OOPHORECTOMY Right    SMALL INTESTINE SURGERY     SUBXYPHOID PERICARDIAL WINDOW N/A 06/18/2022   Procedure: SUBXYPHOID PERICARDIAL WINDOW;  Surgeon: Lovett Sox, MD;  Location: [facility] OR;  Service: Thoracic;  Laterality: N/A;   TEE  WITHOUT CARDIOVERSION N/A 06/18/2022   Procedure: TRANSESOPHAGEAL ECHOCARDIOGRAM (TEE);  Surgeon: Lovett Sox, MD;  Location: [facility] OR;  Service: Thoracic;  Laterality: N/A;   TUBAL LIGATION     VIDEO BRONCHOSCOPY WITH RADIAL ENDOBRONCHIAL ULTRASOUND  12/18/2021   Procedure: RADIAL ENDOBRONCHIAL ULTRASOUND;  Surgeon: Josephine Igo, DO;  Location: [facility];  Service: Pulmonary;;   FAMILY HISTORY Family History  Problem Relation Age of Onset   Diabetes Mother    Hypertension Mother    Hypertension Father    SOCIAL HISTORY Social History   Tobacco Use   Smoking status: Every Day    Current packs/day: 0.50    Average packs/day: 0.5 packs/day for 40.0 years (20.0 ttl pk-yrs)    Types: Cigarettes   Smokeless tobacco: Never   Tobacco comments:    Cutting back 1/4-1/2 ppd  Vaping Use   Vaping status: Never Used  Substance Use Topics   Alcohol use: Not Currently   Drug use: Yes    Frequency: 4.0 times per week    Types: Marijuana    Comment: smokes every other day      OPHTHALMIC EXAM:  Base Eye Exam     Visual Acuity (Snellen - Linear)       Right Left   Dist [place] 20/50 -2 20/30 -2   Dist ph [place] 20/30 +2 NI         Tonometry (Tonopen, 12:44 PM)       Right Left   Pressure 17 16         Pupils       Dark Light Shape React APD   Right 3 3 Round Minimal None   Left 3 3 Round Minimal None         Visual Fields       Left Right    Full Full         Extraocular Movement       Right Left    Full, Ortho Full, Ortho         Neuro/Psych     Oriented x3: Yes   Mood/Affect: Normal         Dilation     Both eyes: 1.0% Mydriacyl, 2.5% Phenylephrine @ 12:44 PM  Slit Lamp and Fundus Exam     Slit Lamp Exam       Right Left   Lids/Lashes Dermatochalasis - upper lid, mild MGD Dermatochalasis - upper lid, mild MGD   Conjunctiva/Sclera nasal pingeucula, Melanosis nasal pingeucula, mild Melanosis   Cornea arcus, well healed  cataract wound, nasal LRI, trace Punctate epithelial erosions arcus, trace Punctate epithelial erosions, trace tear film debris, well healed cataract wound, fine endo pigment   Anterior Chamber deep and clear deep and clear, no cell or flare   Iris Round and dilated Round and dilated   Lens PC IOL in good position PC IOL in good position   Anterior Vitreous mild syneresis, Posterior vitreous detachment mild syneresis         Fundus Exam       Right Left   Disc Pink and Sharp, temporal pallor, temporal PPP Pink and Sharp, temporal pallor, temporal PPP, no NVD, +SVP   C/D Ratio 0.3 0.3   Macula Good foveal reflex, scattered MA, DBH and punctate exudates -- improved, cystic changes -- slightly improved good foveal reflex, dense central cluster of exudates -- improved, scattered MA, DBH and central edema   Vessels attenuated, Tortuous, no NV attenuated, Tortuous, no NV   Periphery Attached, scattered MA Attached, rare MA           IMAGING AND PROCEDURES  Imaging and Procedures for 10/21/2023  OCT, Retina - OU - Both Eyes       Right Eye Quality was good. Central Foveal Thickness: 227. Progression has improved. Findings include normal foveal contour, no SRF, intraretinal hyper-reflective material, intraretinal fluid (persistent IRF / IRHM greatest temporal and superior mac -- improved).   Left Eye Quality was good. Central Foveal Thickness: 177. Progression has worsened. Findings include no SRF, abnormal foveal contour, subretinal hyper-reflective material, intraretinal hyper-reflective material, intraretinal fluid, outer retinal atrophy (Persistent IRF/IRHM greatest IT macula -- slightly increased).   Notes *Images captured and stored on drive  Diagnosis / Impression:  DME OU OD: persistent IRF / IRHM greatest temporal and superior mac -- improved OS: Persistent IRF/IRHM greatest IT macula -- slightly increased  Clinical management:  See below  Abbreviations: NFP - Normal  foveal profile. CME - cystoid macular edema. PED - pigment epithelial detachment. IRF - intraretinal fluid. SRF - subretinal fluid. EZ - ellipsoid zone. ERM - epiretinal membrane. ORA - outer retinal atrophy. ORT - outer retinal tubulation. SRHM - subretinal hyper-reflective material. IRHM - intraretinal hyper-reflective material      Intravitreal Injection, Pharmacologic Agent - OD - Right Eye       Time Out 10/21/2023. 1:20 PM. Confirmed correct patient, procedure, site, and patient consented.   Anesthesia Topical anesthesia was used. Anesthetic medications included Lidocaine 2%, Proparacaine 0.5%.   Procedure Preparation included 5% betadine to ocular surface, eyelid speculum. A supplied (32g) needle was used.   Injection: 1.25 mg Bevacizumab 1.25mg /0.50ml   Route: Intravitreal, Site: Right Eye   NDC: P3213405, Lot: [id number], Expiration date: 11/30/2023   Post-op Post injection exam found visual acuity of at least counting fingers. The patient tolerated the procedure well. There were no complications. The patient received written and verbal post procedure care education.      Intravitreal Injection, Pharmacologic Agent - OS - Left Eye       Time Out 10/21/2023. 1:21 PM. Confirmed correct patient, procedure, site, and patient consented.   Anesthesia Topical anesthesia was used. Anesthetic medications included Lidocaine 2%, Proparacaine 0.5%.   Procedure Preparation  included 5% betadine to ocular surface, eyelid speculum. A (32g) needle was used.   Injection: 2 mg aflibercept 2 MG/0.05ML   Route: Intravitreal, Site: Left Eye   NDC: L6038910, Lot: [id number], Expiration date: 12/25/2024, Waste: 0 mL   Post-op Post injection exam found visual acuity of at least counting fingers. The patient tolerated the procedure well. There were no complications. The patient received written and verbal post procedure care education.            ASSESSMENT/PLAN:   ICD-10-CM   1.  Severe nonproliferative diabetic retinopathy of both eyes with macular edema associated with type 2 diabetes mellitus (HCC)  E11.3413 OCT, Retina - OU - Both Eyes    Intravitreal Injection, Pharmacologic Agent - OD - Right Eye    Intravitreal Injection, Pharmacologic Agent - OS - Left Eye    aflibercept (EYLEA) SOLN 2 mg    Bevacizumab (AVASTIN) SOLN 1.25 mg    2. Current use of insulin (HCC)  Z79.4     3. Essential hypertension  I10     4. Hypertensive retinopathy of both eyes  H35.033     5. Pseudophakia, both eyes  Z96.1      1,2 Severe Non-proliferative diabetic retinopathy, both eyes  - A1C 7.8 (08.13.24), 10.4 (05.09.24), 6.7 (11.30.23) - s/p IVA OD #1 (05.02.24), #2 (05.30.24), #3 (07.24.24), #4 (08.21.24), #5 (10.08.24), #6 (11.05.24), #7 (12.10.24), #8 (01.14.25), #9 (02.18.25) - s/p IVA OS #1 (04.04.24), #2 (05.02.24), #3 (05.30.24), #4 (07.24.24), #5 (08.21.24) -- IVA resistance OS - s/p IVE OS #1 (10.08.24), #2 (11.05.24), #3 (12.10.24), #4 (01.14.25), #5 (02.18.25) - BCVA OD 20/30 from 20/20, OS improved to 20/30 from 20/70 (s/p CEIOL OS) - exam shows scattered MA/DBH and exudates OU (OS > OD) - FA (04.04.24) shows leaking MA greatest posteriorly, no NV OU - OCT shows  OD: persistent IRF / IRHM greatest temporal and superior mac -- improved; OS: Persistent IRF/IRHM greatest IT macula -- slightly increased at 5 weeks - recommend IVA OD #10 and IVE OS #6 today,03.25.25 for DME w/ f/u in 5 wks again - pt wishes to proceed - RBA of procedure discussed, questions answered - Eylea approved with insurance - IVA informed consent obtained and signed, 04.04.24 (OU) - IVE informed consent obtained and signed, 10.08.24 (OU) - see procedure note - f/u in 5 wks -- DFE/OCT, possible injection  3,4. Hypertensive retinopathy OU - discussed importance of tight BP control - monitor  5. Pseudophakia OU  - s/p CE/IOL OU (Dr. Dione Booze, OD: 2017, OS: 03.04.25)  - IOL in good position, doing  well  - monitor  Ophthalmic Meds Ordered this visit:  Meds ordered this encounter  Medications   aflibercept (EYLEA) SOLN 2 mg   Bevacizumab (AVASTIN) SOLN 1.25 mg    Return in about 5 weeks (around 11/25/2023) for f/u NPDR OU, DFE, OCT, Possible Injxn.  There are no Patient Instructions on file for this visit.  Explained the diagnoses, plan, and follow up with the patient and they expressed understanding.  Patient expressed understanding of the importance of proper follow up care.   This document serves as a record of services personally performed by Karie Chimera, MD, PhD. It was created on their behalf by Charlette Caffey, COT an ophthalmic technician. The creation of this record is the provider's dictation and/or activities during the visit.    Electronically signed by:  Charlette Caffey, COT  10/21/23 5:44 PM  This document serves as a record of  services personally performed by Karie Chimera, MD, PhD. It was created on their behalf by Glee Arvin. Manson Passey, OA an ophthalmic technician. The creation of this record is the provider's dictation and/or activities during the visit.    Electronically signed by: Glee Arvin. Manson Passey, OA 10/21/23 5:44 PM  Karie Chimera, M.D., Ph.D. Diseases & Surgery of the Retina and Vitreous [facility]  I have reviewed the above documentation for accuracy and completeness, and I agree with the above. Karie Chimera, M.D., Ph.D. 10/21/23 5:44 PM   Abbreviations: M myopia (nearsighted); A astigmatism; H hyperopia (farsighted); P presbyopia; Mrx spectacle prescription;  CTL contact lenses; OD right eye; OS left eye; OU both eyes  XT exotropia; ET esotropia; PEK punctate epithelial keratitis; PEE punctate epithelial erosions; DES dry eye syndrome; MGD meibomian gland dysfunction; ATs artificial tears; PFAT's preservative free artificial tears; NSC nuclear sclerotic cataract; PSC posterior subcapsular cataract; ERM epi-retinal  membrane; PVD posterior vitreous detachment; RD retinal detachment; DM diabetes mellitus; DR diabetic retinopathy; NPDR non-proliferative diabetic retinopathy; PDR proliferative diabetic retinopathy; CSME clinically significant macular edema; DME diabetic macular edema; dbh dot blot hemorrhages; CWS cotton wool spot; POAG primary open angle glaucoma; C/D cup-to-disc ratio; HVF humphrey visual field; GVF goldmann visual field; OCT optical coherence tomography; IOP intraocular pressure; BRVO Branch retinal vein occlusion; CRVO central retinal vein occlusion; CRAO central retinal artery occlusion; BRAO branch retinal artery occlusion; RT retinal tear; SB scleral buckle; PPV pars plana vitrectomy; VH Vitreous hemorrhage; PRP panretinal laser photocoagulation; IVK intravitreal kenalog; VMT vitreomacular traction; MH Macular hole;  NVD neovascularization of the disc; NVE neovascularization elsewhere; AREDS age related eye disease study; ARMD age related macular degeneration; POAG primary open angle glaucoma; EBMD epithelial/anterior basement membrane dystrophy; ACIOL anterior chamber intraocular lens; IOL intraocular lens; PCIOL posterior chamber intraocular lens; Phaco/IOL phacoemulsification with intraocular lens placement; PRK photorefractive keratectomy; LASIK laser assisted in situ keratomileusis; HTN hypertension; DM diabetes mellitus; COPD chronic obstructive pulmonary disease

## 2023-10-10 DIAGNOSIS — N186 End stage renal disease: Secondary | ICD-10-CM | POA: Diagnosis not present

## 2023-10-10 DIAGNOSIS — Z992 Dependence on renal dialysis: Secondary | ICD-10-CM | POA: Diagnosis not present

## 2023-10-10 DIAGNOSIS — N2581 Secondary hyperparathyroidism of renal origin: Secondary | ICD-10-CM | POA: Diagnosis not present

## 2023-10-13 ENCOUNTER — Other Ambulatory Visit: Payer: Self-pay

## 2023-10-13 DIAGNOSIS — E1142 Type 2 diabetes mellitus with diabetic polyneuropathy: Secondary | ICD-10-CM

## 2023-10-13 DIAGNOSIS — N2581 Secondary hyperparathyroidism of renal origin: Secondary | ICD-10-CM | POA: Diagnosis not present

## 2023-10-13 DIAGNOSIS — E785 Hyperlipidemia, unspecified: Secondary | ICD-10-CM

## 2023-10-13 DIAGNOSIS — I10 Essential (primary) hypertension: Secondary | ICD-10-CM

## 2023-10-13 DIAGNOSIS — N186 End stage renal disease: Secondary | ICD-10-CM | POA: Diagnosis not present

## 2023-10-13 DIAGNOSIS — Z992 Dependence on renal dialysis: Secondary | ICD-10-CM | POA: Diagnosis not present

## 2023-10-13 MED ORDER — ATORVASTATIN CALCIUM 80 MG PO TABS: 80.0000 mg | ORAL_TABLET | Freq: Every day | ORAL | 3 refills | Status: DC

## 2023-10-13 NOTE — Telephone Encounter (Signed)
 Medication sent to pharmacy

## 2023-10-14 ENCOUNTER — Other Ambulatory Visit: Payer: Self-pay | Admitting: Hematology & Oncology

## 2023-10-14 ENCOUNTER — Encounter: Payer: Self-pay | Admitting: Medical Oncology

## 2023-10-14 ENCOUNTER — Inpatient Hospital Stay (HOSPITAL_BASED_OUTPATIENT_CLINIC_OR_DEPARTMENT_OTHER): Admitting: Medical Oncology

## 2023-10-14 ENCOUNTER — Inpatient Hospital Stay: Payer: 59

## 2023-10-14 ENCOUNTER — Telehealth: Payer: Self-pay | Admitting: *Deleted

## 2023-10-14 ENCOUNTER — Ambulatory Visit: Payer: 59

## 2023-10-14 ENCOUNTER — Inpatient Hospital Stay: Payer: 59 | Admitting: Medical Oncology

## 2023-10-14 ENCOUNTER — Ambulatory Visit: Payer: 59 | Admitting: Medical Oncology

## 2023-10-14 ENCOUNTER — Inpatient Hospital Stay: Attending: Radiation Oncology

## 2023-10-14 ENCOUNTER — Inpatient Hospital Stay

## 2023-10-14 VITALS — BP 147/66 | HR 94 | Resp 18

## 2023-10-14 VITALS — BP 143/61 | HR 99 | Temp 97.8°F | Resp 20 | Ht 67.0 in | Wt 127.8 lb

## 2023-10-14 DIAGNOSIS — N186 End stage renal disease: Secondary | ICD-10-CM | POA: Diagnosis not present

## 2023-10-14 DIAGNOSIS — Z2989 Encounter for other specified prophylactic measures: Secondary | ICD-10-CM

## 2023-10-14 DIAGNOSIS — Z79620 Long term (current) use of immunosuppressive biologic: Secondary | ICD-10-CM | POA: Insufficient documentation

## 2023-10-14 DIAGNOSIS — D631 Anemia in chronic kidney disease: Secondary | ICD-10-CM

## 2023-10-14 DIAGNOSIS — R5383 Other fatigue: Secondary | ICD-10-CM

## 2023-10-14 DIAGNOSIS — C3411 Malignant neoplasm of upper lobe, right bronchus or lung: Secondary | ICD-10-CM

## 2023-10-14 DIAGNOSIS — N184 Chronic kidney disease, stage 4 (severe): Secondary | ICD-10-CM | POA: Diagnosis not present

## 2023-10-14 DIAGNOSIS — Z992 Dependence on renal dialysis: Secondary | ICD-10-CM | POA: Insufficient documentation

## 2023-10-14 DIAGNOSIS — Z5112 Encounter for antineoplastic immunotherapy: Secondary | ICD-10-CM | POA: Diagnosis not present

## 2023-10-14 DIAGNOSIS — F17210 Nicotine dependence, cigarettes, uncomplicated: Secondary | ICD-10-CM | POA: Diagnosis not present

## 2023-10-14 LAB — CBC WITH DIFFERENTIAL (CANCER CENTER ONLY)
Abs Immature Granulocytes: 0.03 10*3/uL (ref 0.00–0.07)
Basophils Absolute: 0.1 10*3/uL (ref 0.0–0.1)
Basophils Relative: 1 %
Eosinophils Absolute: 0.2 10*3/uL (ref 0.0–0.5)
Eosinophils Relative: 3 %
HCT: 37 % (ref 36.0–46.0)
Hemoglobin: 11.7 g/dL — ABNORMAL LOW (ref 12.0–15.0)
Immature Granulocytes: 0 %
Lymphocytes Relative: 9 %
Lymphs Abs: 0.7 10*3/uL (ref 0.7–4.0)
MCH: 31.8 pg (ref 26.0–34.0)
MCHC: 31.6 g/dL (ref 30.0–36.0)
MCV: 100.5 fL — ABNORMAL HIGH (ref 80.0–100.0)
Monocytes Absolute: 0.5 10*3/uL (ref 0.1–1.0)
Monocytes Relative: 7 %
Neutro Abs: 6 10*3/uL (ref 1.7–7.7)
Neutrophils Relative %: 80 %
Platelet Count: 163 10*3/uL (ref 150–400)
RBC: 3.68 MIL/uL — ABNORMAL LOW (ref 3.87–5.11)
RDW: 18.1 % — ABNORMAL HIGH (ref 11.5–15.5)
WBC Count: 7.5 10*3/uL (ref 4.0–10.5)
nRBC: 0 % (ref 0.0–0.2)

## 2023-10-14 LAB — CMP (CANCER CENTER ONLY)
ALT: 36 U/L (ref 0–44)
AST: 30 U/L (ref 15–41)
Albumin: 3.8 g/dL (ref 3.5–5.0)
Alkaline Phosphatase: 123 U/L (ref 38–126)
Anion gap: 15 (ref 5–15)
BUN: 53 mg/dL — ABNORMAL HIGH (ref 8–23)
CO2: 28 mmol/L (ref 22–32)
Calcium: 8.4 mg/dL — ABNORMAL LOW (ref 8.9–10.3)
Chloride: 98 mmol/L (ref 98–111)
Creatinine: 5.48 mg/dL — ABNORMAL HIGH (ref 0.44–1.00)
GFR, Estimated: 8 mL/min — ABNORMAL LOW (ref >60)
Glucose, Bld: 176 mg/dL — ABNORMAL HIGH (ref 70–99)
Potassium: 4.8 mmol/L (ref 3.5–5.1)
Sodium: 141 mmol/L (ref 135–145)
Total Bilirubin: 0.3 mg/dL (ref 0.0–1.2)
Total Protein: 6.6 g/dL (ref 6.5–8.1)

## 2023-10-14 LAB — SAMPLE TO BLOOD BANK

## 2023-10-14 LAB — IRON AND IRON BINDING CAPACITY (CC-WL,HP ONLY)
Iron: 51 ug/dL (ref 28–170)
Saturation Ratios: 18 % (ref 10.4–31.8)
TIBC: 280 ug/dL (ref 250–450)
UIBC: 229 ug/dL (ref 148–442)

## 2023-10-14 LAB — FERRITIN: Ferritin: 428 ng/mL — ABNORMAL HIGH (ref 11–307)

## 2023-10-14 LAB — TSH: TSH: 0.798 u[IU]/mL (ref 0.350–4.500)

## 2023-10-14 LAB — LACTATE DEHYDROGENASE: LDH: 269 U/L — ABNORMAL HIGH (ref 98–192)

## 2023-10-14 MED ORDER — SODIUM CHLORIDE 0.9 % IV SOLN
1200.0000 mg | Freq: Once | INTRAVENOUS | Status: AC
  Administered 2023-10-14: 1200 mg via INTRAVENOUS
  Filled 2023-10-14: qty 20

## 2023-10-14 MED ORDER — SODIUM CHLORIDE 0.9 % IV SOLN
Freq: Once | INTRAVENOUS | Status: AC
Start: 2023-10-14 — End: 2023-10-14

## 2023-10-14 NOTE — Telephone Encounter (Signed)
 This nurse called [facility] to get PET scan results. Per Radiology, results are taking 10 -14 days to be read. PET scan was on 10/08/23. Message given to Melody Haver.

## 2023-10-14 NOTE — Progress Notes (Signed)
 Ok to treat with elevated Scr. Pt is on HD.  Anola Gurney [place], [place], BCPS, BCOP 10/14/2023 11:49 AM

## 2023-10-14 NOTE — Progress Notes (Signed)
 Hematology and Oncology Follow Up Visit  Daya Dutt [id number] 08/15/1960 63 y.o. 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 Cinnamon is back for follow-up.  Today she states that she is ok. She has had fatigue since dialysis this morning but otherwise is well.   Her last PET scan was performed last week at [facility]. She is overdue for her MR brain.   She has had no problems with cough, new SOB or hemoptysis.  She is still smoking half pack a day.  She has had no problems with nausea or vomiting.  There is been no change in bowel or bladder habits.  Again, she gets her dialysis.  She has had no issues with bleeding.  There is been no rashes.  She has had no leg swelling.  Overall, I would say that her performance status is probably ECOG 2.       Wt Readings from Last 3 Encounters:  10/14/23 127 lb 12.8 oz (58 kg)  09/23/23 121 lb 12.8 oz (55.2 kg)  08/27/23 114 lb (51.7 kg)     Medications:  Current Outpatient Medications:    amLODipine (NORVASC) 10 MG tablet, Take 1 tablet (10 mg total) by mouth daily., Disp: 90 tablet, Rfl: 3   amlodipine-olmesartan (AZOR) 10-20 MG tablet, Take 1 tablet by mouth at bedtime., Disp: , Rfl:    aspirin EC 81 MG tablet, Take 1 tablet (81 mg total) by mouth daily. Swallow whole., Disp: 30 tablet, Rfl: 11   atorvastatin (LIPITOR) 80 MG tablet, Take 1 tablet (80 mg total) by mouth daily., Disp: 90 tablet, Rfl: 3   B Complex-C-Zn-Folic Acid (DIALYVITE 800 WITH ZINC) 0.8 MG TABS, Take 1 tablet by mouth daily.,  Disp: , Rfl:    Blood Glucose Calibration (MYGLUCOHEALTH CONTROL) SOLN, , Disp: , Rfl:    calcium acetate (PHOSLO) 667 MG tablet, Take 667 mg by mouth 3 (three) times daily., Disp: , Rfl:    Calcium Acetate 668 (169 Ca) MG TABS, Take 1 tablet by mouth 3 (three) times daily., Disp: , Rfl:    carvedilol (COREG) 12.5 MG tablet, Take 12.5 mg by mouth., Disp: , Rfl:    Continuous Glucose Sensor (DEXCOM G7 SENSOR) MISC, Apply each device on your skin every 10 days, Disp: 3 each, Rfl: 2   dexamethasone (DECADRON) 4 MG tablet, Take 2 tabs by mouth starting day after last dose of etoposide for one day only. Repeat every 21 days. Take with food., Disp: 8 tablet, Rfl: 0   dronabinol (MARINOL) 2.5 MG capsule, Take 1 capsule (2.5 mg total) by mouth 2 (two) times daily before a meal., Disp: 60 capsule, Rfl: 0   gabapentin (NEURONTIN) 600 MG tablet, Take 1 tablet (600 mg total) by mouth daily., Disp: 30 tablet, Rfl: 3   glucose blood (PRECISION QID TEST) test strip, , Disp: , Rfl:    hydrALAZINE (APRESOLINE) 50 MG tablet, Take 1 tablet (50 mg total) by mouth every 8 (eight) hours., Disp: 180 tablet, Rfl: 3   Insulin Pen Needle (UNIFINE PENTIPS) 32G X 4 MM MISC, Use in the morning and  at bedtime, Disp: 100 each, Rfl: 3   Lancet Devices (ADJUSTABLE LANCING DEVICE) MISC, , Disp: , Rfl:    losartan (COZAAR) 25 MG tablet, Take 1 tablet (25 mg total) by mouth daily., Disp: 30 tablet, Rfl: 11   megestrol (MEGACE ES) 625 MG/5ML suspension, Take 5 mLs (625 mg total) by mouth daily., Disp: 150 mL, Rfl: 4   pantoprazole (PROTONIX) 40 MG tablet, Take 40 mg by mouth daily., Disp: , Rfl:    prednisoLONE acetate (PRED FORTE) 1 % ophthalmic suspension, Place 1 drop into the left eye 4 (four) times daily., Disp: , Rfl:    sevelamer carbonate (RENVELA) 800 MG tablet, Take 800 mg by mouth 3 (three) times daily with meals., Disp: , Rfl:    traZODone (DESYREL) 50 MG tablet, Take 1 tablet (50 mg total) by mouth at bedtime., Disp: 90  tablet, Rfl: 1   Vitamin D, Ergocalciferol, (DRISDOL) 1.25 MG (50000 UNIT) CAPS capsule, Take 1 capsule (50,000 Units total) by mouth every 7 (seven) days., Disp: 5 capsule, Rfl: 0   acetaminophen (TYLENOL) 500 MG tablet, Take 1,000 mg by mouth every 6 (six) hours as needed for mild pain. (Patient not taking: Reported on 09/23/2023), Disp: , Rfl:    albuterol (VENTOLIN HFA) 108 (90 Base) MCG/ACT inhaler, Inhale 2 puffs into the lungs every 6 (six) hours as needed for wheezing or shortness of breath. (Patient not taking: Reported on 10/14/2023), Disp: 6.7 g, Rfl: 2   furosemide (LASIX) 40 MG tablet, Take 1 tablet (40 mg total) by mouth daily as needed (swelling in legs or weight gain)., Disp: 30 tablet, Rfl: 1   ondansetron (ZOFRAN) 8 MG tablet, Take 1 tablet (8 mg total) by mouth every 8 (eight) hours as needed for nausea, vomiting or refractory nausea / vomiting. Start on the third day after carboplatin. (Patient not taking: Reported on 10/14/2023), Disp: 30 tablet, Rfl: 1   prochlorperazine (COMPAZINE) 10 MG tablet, Take 1 tablet (10 mg total) by mouth every 6 (six) hours as needed for nausea or vomiting. (Patient not taking: Reported on 10/14/2023), Disp: 30 tablet, Rfl: 1 No current facility-administered medications for this visit.  Facility-Administered Medications Ordered in Other Visits:    atezolizumab (TECENTRIQ) 1,200 mg in sodium chloride 0.9 % 250 mL chemo infusion, 1,200 mg, Intravenous, Once, Ennever, Rose Phi, MD  Allergies:  Allergies  Allergen Reactions   Penicillins Itching and Other (See Comments)    redness   Strawberry (Diagnostic) Itching and Swelling   Tomato Itching and Swelling    Past Medical History, Surgical history, Social history, and Family History were reviewed and updated.  Review of Systems: Review of Systems  Constitutional:  Positive for unexpected weight change. Negative for appetite change.  HENT:  Negative.    Eyes: Negative.   Respiratory: Negative.     Cardiovascular: Negative.   Gastrointestinal: Negative.   Endocrine: Negative.   Genitourinary: Negative.    Musculoskeletal: Negative.   Skin: Negative.   Neurological:  Positive for headaches.  Hematological: Negative.   Psychiatric/Behavioral: Negative.      Physical Exam:  height is 5\' 7"  (1.702 m) and weight is 127 lb 12.8 oz (58 kg). Her oral temperature is 97.8 F (36.6 C). Her blood pressure is 143/61 (abnormal) and her pulse is 99. Her respiration is 20 and oxygen saturation is 100%.   Wt Readings from Last 3 Encounters:  10/14/23 127 lb 12.8 oz (58 kg)  09/23/23 121 lb 12.8 oz (55.2 kg)  08/27/23 114  lb (51.7 kg)    Physical Exam Vitals reviewed.  HENT:     Head: Normocephalic and atraumatic.  Eyes:     Pupils: Pupils are equal, round, and reactive to light.  Cardiovascular:     Rate and Rhythm: Normal rate and regular rhythm.     Heart sounds: Normal heart sounds.  Pulmonary:     Effort: Pulmonary effort is normal.     Breath sounds: Normal breath sounds.  Abdominal:     General: Bowel sounds are normal.     Palpations: Abdomen is soft.  Musculoskeletal:        General: No tenderness or deformity. Normal range of motion.     Cervical back: Normal range of motion.  Lymphadenopathy:     Cervical: No cervical adenopathy.  Skin:    General: Skin is warm and dry.     Findings: No erythema or rash.  Neurological:     Mental Status: She is alert and oriented to person, place, and time.  Psychiatric:        Behavior: Behavior normal.        Thought Content: Thought content normal.        Judgment: Judgment normal.      Lab Results  Component Value Date   WBC 7.5 10/14/2023   HGB 11.7 (L) 10/14/2023   HCT 37.0 10/14/2023   MCV 100.5 (H) 10/14/2023   PLT 163 10/14/2023     Chemistry      Component Value Date/Time   NA 141 10/14/2023 1030   NA 141 03/11/2023 1220   K 4.8 10/14/2023 1030   CL 98 10/14/2023 1030   CO2 28 10/14/2023 1030   BUN 53  (H) 10/14/2023 1030   BUN 26 03/11/2023 1220   CREATININE 5.48 (H) 10/14/2023 1030      Component Value Date/Time   CALCIUM 8.4 (L) 10/14/2023 1030   ALKPHOS 123 10/14/2023 1030   AST 30 10/14/2023 1030   ALT 36 10/14/2023 1030   BILITOT 0.3 10/14/2023 1030      Encounter Diagnoses  Name Primary?   Other fatigue Yes   Immunotherapy    Small cell lung cancer, right upper lobe (HCC)    Anemia of chronic renal failure, stage 4 (severe) (HCC)    Impression and Plan: Ms. Spizzirri is a very nice 63 year old African-American female.  She has history of limited stage small cell lung cancer in addition to ESRD.  Unfortunately, because of her overall health status, she really could not have combined radiation therapy and chemotherapy.  Plan is to continue her Tecentriq right now. Labs reviewed and are acceptable for treatment today.  Smoking cessation encouraged TSH added on to labs today No ESA needed given her Hgb We will obtain her PET report. We will also work to get her MRI of her brain scheduled  RTC 3 weeks MD, port labs (CBC w/, CMP, LDH, sample to blood bank, iron, ferritin), Injection, Infusion    Rushie Chestnut, [place] 3/18/202511:51 AM

## 2023-10-14 NOTE — Patient Instructions (Signed)
 [facility] - A DEPT OF [facility]  Discharge Instructions: Thank you for choosing [facility] to provide your oncology and hematology care.   If you have a lab appointment with the Cancer Center, please go directly to the Cancer Center and check in at the registration area.  Wear comfortable clothing and clothing appropriate for easy access to any Portacath or PICC line.   We strive to give you quality time with your provider. You may need to reschedule your appointment if you arrive late (15 or more minutes).  Arriving late affects you and other patients whose appointments are after yours.  Also, if you miss three or more appointments without notifying the office, you may be dismissed from the clinic at the provider's discretion.      For prescription refill requests, have your pharmacy contact our office and allow 72 hours for refills to be completed.    Today you received the following chemotherapy and/or immunotherapy agents Tecentriq      To help prevent nausea and vomiting after your treatment, we encourage you to take your nausea medication as directed.  BELOW ARE SYMPTOMS THAT SHOULD BE REPORTED IMMEDIATELY: *FEVER GREATER THAN 100.4 F (38 C) OR HIGHER *CHILLS OR SWEATING *NAUSEA AND VOMITING THAT IS NOT CONTROLLED WITH YOUR NAUSEA MEDICATION *UNUSUAL SHORTNESS OF BREATH *UNUSUAL BRUISING OR BLEEDING *URINARY PROBLEMS (pain or burning when urinating, or frequent urination) *BOWEL PROBLEMS (unusual diarrhea, constipation, pain near the anus) TENDERNESS IN MOUTH AND THROAT WITH OR WITHOUT PRESENCE OF ULCERS (sore throat, sores in mouth, or a toothache) UNUSUAL RASH, SWELLING OR PAIN  UNUSUAL VAGINAL DISCHARGE OR ITCHING   Items with * indicate a potential emergency and should be followed up as soon as possible or go to the Emergency Department if any problems should occur.  Please show the CHEMOTHERAPY ALERT CARD or IMMUNOTHERAPY  ALERT CARD at check-in to the Emergency Department and triage nurse. Should you have questions after your visit or need to cancel or reschedule your appointment, please contact [facility] - A DEPT OF [facility]  [phone] and follow the prompts.  Office hours are 8:00 a.m. to 4:30 p.m. Monday - Friday. Please note that voicemails left after 4:00 p.m. may not be returned until the following business day.  We are closed weekends and major holidays. You have access to a nurse at all times for urgent questions. Please call the main number to the clinic [phone] and follow the prompts.  For any non-urgent questions, you may also contact your provider using MyChart. We now offer e-Visits for anyone 74 and older to request care online for non-urgent symptoms. For details visit [email or web address].   Also download the MyChart app! Go to the app store, search "MyChart", open the app, select [facility], and log in with your MyChart username and password.

## 2023-10-15 ENCOUNTER — Other Ambulatory Visit: Payer: Self-pay

## 2023-10-15 DIAGNOSIS — N2581 Secondary hyperparathyroidism of renal origin: Secondary | ICD-10-CM | POA: Diagnosis not present

## 2023-10-15 DIAGNOSIS — N186 End stage renal disease: Secondary | ICD-10-CM | POA: Diagnosis not present

## 2023-10-15 DIAGNOSIS — Z992 Dependence on renal dialysis: Secondary | ICD-10-CM | POA: Diagnosis not present

## 2023-10-15 MED ORDER — CARVEDILOL 12.5 MG PO TABS: 12.5000 mg | ORAL_TABLET | Freq: Every day | ORAL | 3 refills | Status: DC

## 2023-10-15 NOTE — Telephone Encounter (Signed)
 Please address.

## 2023-10-16 ENCOUNTER — Encounter: Payer: Self-pay | Admitting: Medical Oncology

## 2023-10-17 DIAGNOSIS — N186 End stage renal disease: Secondary | ICD-10-CM | POA: Diagnosis not present

## 2023-10-17 DIAGNOSIS — N2581 Secondary hyperparathyroidism of renal origin: Secondary | ICD-10-CM | POA: Diagnosis not present

## 2023-10-17 DIAGNOSIS — R770 Abnormality of albumin: Secondary | ICD-10-CM | POA: Diagnosis not present

## 2023-10-17 DIAGNOSIS — Z992 Dependence on renal dialysis: Secondary | ICD-10-CM | POA: Diagnosis not present

## 2023-10-17 NOTE — Telephone Encounter (Signed)
 Already addressed

## 2023-10-17 NOTE — Telephone Encounter (Signed)
 Please address.

## 2023-10-20 DIAGNOSIS — N186 End stage renal disease: Secondary | ICD-10-CM | POA: Diagnosis not present

## 2023-10-20 DIAGNOSIS — Z992 Dependence on renal dialysis: Secondary | ICD-10-CM | POA: Diagnosis not present

## 2023-10-20 DIAGNOSIS — N2581 Secondary hyperparathyroidism of renal origin: Secondary | ICD-10-CM | POA: Diagnosis not present

## 2023-10-21 ENCOUNTER — Encounter (INDEPENDENT_AMBULATORY_CARE_PROVIDER_SITE_OTHER): Payer: Self-pay | Admitting: Ophthalmology

## 2023-10-21 ENCOUNTER — Other Ambulatory Visit: Payer: Self-pay

## 2023-10-21 ENCOUNTER — Ambulatory Visit (INDEPENDENT_AMBULATORY_CARE_PROVIDER_SITE_OTHER): Payer: 59 | Admitting: Ophthalmology

## 2023-10-21 DIAGNOSIS — Z961 Presence of intraocular lens: Secondary | ICD-10-CM | POA: Diagnosis not present

## 2023-10-21 DIAGNOSIS — Z794 Long term (current) use of insulin: Secondary | ICD-10-CM | POA: Diagnosis not present

## 2023-10-21 DIAGNOSIS — E113413 Type 2 diabetes mellitus with severe nonproliferative diabetic retinopathy with macular edema, bilateral: Secondary | ICD-10-CM

## 2023-10-21 DIAGNOSIS — H35033 Hypertensive retinopathy, bilateral: Secondary | ICD-10-CM

## 2023-10-21 DIAGNOSIS — H25812 Combined forms of age-related cataract, left eye: Secondary | ICD-10-CM

## 2023-10-21 DIAGNOSIS — I10 Essential (primary) hypertension: Secondary | ICD-10-CM

## 2023-10-21 MED ORDER — BEVACIZUMAB CHEMO INJECTION 1.25MG/0.05ML SYRINGE FOR KALEIDOSCOPE
1.2500 mg | INTRAVITREAL | Status: AC | PRN
  Administered 2023-10-21: 1.25 mg via INTRAVITREAL

## 2023-10-21 MED ORDER — AFLIBERCEPT 2MG/0.05ML IZ SOLN FOR KALEIDOSCOPE
2.0000 mg | INTRAVITREAL | Status: AC | PRN
  Administered 2023-10-21: 2 mg via INTRAVITREAL

## 2023-10-22 ENCOUNTER — Other Ambulatory Visit: Payer: Self-pay

## 2023-10-22 DIAGNOSIS — N2581 Secondary hyperparathyroidism of renal origin: Secondary | ICD-10-CM | POA: Diagnosis not present

## 2023-10-22 DIAGNOSIS — N186 End stage renal disease: Secondary | ICD-10-CM | POA: Diagnosis not present

## 2023-10-22 DIAGNOSIS — Z992 Dependence on renal dialysis: Secondary | ICD-10-CM | POA: Diagnosis not present

## 2023-10-24 DIAGNOSIS — Z992 Dependence on renal dialysis: Secondary | ICD-10-CM | POA: Diagnosis not present

## 2023-10-24 DIAGNOSIS — N186 End stage renal disease: Secondary | ICD-10-CM | POA: Diagnosis not present

## 2023-10-24 DIAGNOSIS — N2581 Secondary hyperparathyroidism of renal origin: Secondary | ICD-10-CM | POA: Diagnosis not present

## 2023-10-27 DIAGNOSIS — N186 End stage renal disease: Secondary | ICD-10-CM | POA: Diagnosis not present

## 2023-10-27 DIAGNOSIS — N2581 Secondary hyperparathyroidism of renal origin: Secondary | ICD-10-CM | POA: Diagnosis not present

## 2023-10-27 DIAGNOSIS — Z992 Dependence on renal dialysis: Secondary | ICD-10-CM | POA: Diagnosis not present

## 2023-10-28 DIAGNOSIS — I129 Hypertensive chronic kidney disease with stage 1 through stage 4 chronic kidney disease, or unspecified chronic kidney disease: Secondary | ICD-10-CM | POA: Diagnosis not present

## 2023-10-28 DIAGNOSIS — N186 End stage renal disease: Secondary | ICD-10-CM | POA: Diagnosis not present

## 2023-10-28 DIAGNOSIS — Z992 Dependence on renal dialysis: Secondary | ICD-10-CM | POA: Diagnosis not present

## 2023-10-29 DIAGNOSIS — N186 End stage renal disease: Secondary | ICD-10-CM | POA: Diagnosis not present

## 2023-10-29 DIAGNOSIS — N2581 Secondary hyperparathyroidism of renal origin: Secondary | ICD-10-CM | POA: Diagnosis not present

## 2023-10-29 DIAGNOSIS — Z992 Dependence on renal dialysis: Secondary | ICD-10-CM | POA: Diagnosis not present

## 2023-10-31 DIAGNOSIS — Z992 Dependence on renal dialysis: Secondary | ICD-10-CM | POA: Diagnosis not present

## 2023-10-31 DIAGNOSIS — N186 End stage renal disease: Secondary | ICD-10-CM | POA: Diagnosis not present

## 2023-10-31 DIAGNOSIS — N2581 Secondary hyperparathyroidism of renal origin: Secondary | ICD-10-CM | POA: Diagnosis not present

## 2023-11-03 ENCOUNTER — Encounter: Admitting: Student

## 2023-11-03 DIAGNOSIS — N2581 Secondary hyperparathyroidism of renal origin: Secondary | ICD-10-CM | POA: Diagnosis not present

## 2023-11-03 DIAGNOSIS — Z992 Dependence on renal dialysis: Secondary | ICD-10-CM | POA: Diagnosis not present

## 2023-11-03 DIAGNOSIS — N186 End stage renal disease: Secondary | ICD-10-CM | POA: Diagnosis not present

## 2023-11-03 NOTE — Progress Notes (Deleted)
 Established Patient Office Visit  Subjective   Patient ID: Amber Cross, female    DOB: 06/06/1961  Age: 63 y.o. MRN: [id number]  No chief complaint on file.   HPI This is a 63 year old female living with a history stated below and presents today for DM follow up. Please see problem based assessment and plan for additional details.   Past Medical History:  Diagnosis Date  . Anemia   . Anemia of chronic renal failure, stage 4 (severe) (HCC) 07/08/2023  . Arthritis   . Chest pain 05/07/2021  . CHF (congestive heart failure) (HCC)   . Chronic kidney disease    dialysis M-W-F  . Depression   . Diabetes mellitus   . Headache   . Hypercholesteremia   . Hypertension   . Hypertensive emergency 06/16/2022  . Lung cancer (HCC) 12/18/2021   RUL small cell carcinoma, s/p radiation  . 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 Postop check 08/05/2022  . Pseudogout   . Stroke (HCC) 10/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  . Tamponade 06/18/2022  . Tobacco abuse    Social History   Tobacco Use  . Smoking status: Every Day    Current packs/day: 0.50    Average packs/day: 0.5 packs/day for 40.0 years (20.0 ttl pk-yrs)    Types: Cigarettes  . Smokeless tobacco: Never  . Tobacco comments:    Cutting back 1/4-1/2 ppd  Vaping Use  . Vaping status: Never Used  Substance Use Topics  . Alcohol use: Not Currently  . Drug use: Yes    Frequency: 4.0 times per week    Types: Marijuana    Comment: smokes every other day      ROS   As per Assessment and Plan  Objective:     LMP 09/29/2011  BP Readings from Last 3 Encounters:  10/14/23 (!) 147/66  10/14/23 (!) 143/61  09/23/23 (!) 132/53   Wt Readings from Last 3 Encounters:  10/14/23 127 lb 12.8 oz (58 kg)  09/23/23 121 lb 12.8 oz (55.2 kg)  08/27/23 114 lb (51.7 kg)   SpO2 Readings from Last 3 Encounters:  10/14/23 92%  10/14/23 100%  09/23/23 98%      Physical Exam  General: Sitting in chair, no acute  distress Cardiovascular: Regular rate, no murmurs appreciated Pulmonary: Bathing comfortably, no wheezing or crackles Abdomen: Soft, nontender, nondistended MSK: Range of motion intact, no lower extremity edema bilaterally, 2+ DP pulses intact  No results found for any visits on 11/03/23.  Last CBC Lab Results  Component Value Date   WBC 7.5 10/14/2023   HGB 11.7 (L) 10/14/2023   HCT 37.0 10/14/2023   MCV 100.5 (H) 10/14/2023   MCH 31.8 10/14/2023   RDW 18.1 (H) 10/14/2023   PLT 163 10/14/2023   Last metabolic panel Lab Results  Component Value Date   GLUCOSE 176 (H) 10/14/2023   NA 141 10/14/2023   K 4.8 10/14/2023   CL 98 10/14/2023   CO2 28 10/14/2023   BUN 53 (H) 10/14/2023   CREATININE 5.48 (H) 10/14/2023   GFRNONAA 8 (L) 10/14/2023   CALCIUM 8.4 (L) 10/14/2023   PHOS 4.1 07/02/2022   PROT 6.6 10/14/2023   ALBUMIN 3.8 10/14/2023   LABGLOB 2.4 03/11/2023   AGRATIO 0.5 (L) 01/31/2022   BILITOT 0.3 10/14/2023   ALKPHOS 123 10/14/2023   AST 30 10/14/2023   ALT 36 10/14/2023   ANIONGAP 15 10/14/2023   Last lipids Lab Results  Component Value Date  CHOL 226 (H) 01/28/2022   HDL 44 01/28/2022   LDLCALC 143 (H) 01/28/2022   TRIG 194 (H) 01/28/2022   CHOLHDL 5.1 01/28/2022   Last hemoglobin A1c Lab Results  Component Value Date   HGBA1C 7.8 (A) 03/11/2023      The ASCVD Risk score (Arnett DK, et al., 2019) failed to calculate for the following reasons:   Risk score cannot be calculated because patient has a medical history suggesting prior/existing ASCVD    Assessment & Plan:  HTN BP Readings from Last 3 Encounters:  10/14/23 (!) 147/66  10/14/23 (!) 143/61  09/23/23 (!) 132/53    Type 2 diabetes Lab Results  Component Value Date   HGBA1C 7.8 (A) 03/11/2023   -Medications:   Screening exam - Mammo  Anemia of chronic kidney disease   Severe nonproliferative diabetic retinopathy of both eyes with macular edema associated with type 2  diabetes mellitus (HCC)  - Follows Dr. 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    Jeral Pinch, DO

## 2023-11-04 ENCOUNTER — Inpatient Hospital Stay: Payer: 59 | Attending: Radiation Oncology

## 2023-11-04 ENCOUNTER — Inpatient Hospital Stay (HOSPITAL_BASED_OUTPATIENT_CLINIC_OR_DEPARTMENT_OTHER): Payer: 59 | Admitting: Hematology & Oncology

## 2023-11-04 ENCOUNTER — Inpatient Hospital Stay: Payer: 59

## 2023-11-04 ENCOUNTER — Other Ambulatory Visit: Payer: Self-pay

## 2023-11-04 ENCOUNTER — Encounter: Payer: Self-pay | Admitting: Hematology & Oncology

## 2023-11-04 VITALS — BP 163/71 | HR 99 | Temp 98.7°F | Resp 18 | Ht 67.0 in | Wt 120.0 lb

## 2023-11-04 VITALS — BP 150/67 | HR 96 | Resp 17

## 2023-11-04 DIAGNOSIS — Z992 Dependence on renal dialysis: Secondary | ICD-10-CM | POA: Diagnosis not present

## 2023-11-04 DIAGNOSIS — Z923 Personal history of irradiation: Secondary | ICD-10-CM | POA: Insufficient documentation

## 2023-11-04 DIAGNOSIS — Z2989 Encounter for other specified prophylactic measures: Secondary | ICD-10-CM

## 2023-11-04 DIAGNOSIS — I3139 Other pericardial effusion (noninflammatory): Secondary | ICD-10-CM | POA: Diagnosis not present

## 2023-11-04 DIAGNOSIS — R5383 Other fatigue: Secondary | ICD-10-CM

## 2023-11-04 DIAGNOSIS — N186 End stage renal disease: Secondary | ICD-10-CM | POA: Diagnosis not present

## 2023-11-04 DIAGNOSIS — C3491 Malignant neoplasm of unspecified part of right bronchus or lung: Secondary | ICD-10-CM | POA: Insufficient documentation

## 2023-11-04 DIAGNOSIS — C3411 Malignant neoplasm of upper lobe, right bronchus or lung: Secondary | ICD-10-CM | POA: Diagnosis not present

## 2023-11-04 DIAGNOSIS — Z5112 Encounter for antineoplastic immunotherapy: Secondary | ICD-10-CM | POA: Diagnosis not present

## 2023-11-04 DIAGNOSIS — J90 Pleural effusion, not elsewhere classified: Secondary | ICD-10-CM | POA: Diagnosis not present

## 2023-11-04 DIAGNOSIS — D631 Anemia in chronic kidney disease: Secondary | ICD-10-CM | POA: Insufficient documentation

## 2023-11-04 LAB — CBC WITH DIFFERENTIAL (CANCER CENTER ONLY)
Abs Immature Granulocytes: 0.02 10*3/uL (ref 0.00–0.07)
Basophils Absolute: 0 10*3/uL (ref 0.0–0.1)
Basophils Relative: 1 %
Eosinophils Absolute: 0 10*3/uL (ref 0.0–0.5)
Eosinophils Relative: 0 %
HCT: 37.5 % (ref 36.0–46.0)
Hemoglobin: 11.7 g/dL — ABNORMAL LOW (ref 12.0–15.0)
Immature Granulocytes: 0 %
Lymphocytes Relative: 15 %
Lymphs Abs: 0.8 10*3/uL (ref 0.7–4.0)
MCH: 29.9 pg (ref 26.0–34.0)
MCHC: 31.2 g/dL (ref 30.0–36.0)
MCV: 95.9 fL (ref 80.0–100.0)
Monocytes Absolute: 0.6 10*3/uL (ref 0.1–1.0)
Monocytes Relative: 10 %
Neutro Abs: 4 10*3/uL (ref 1.7–7.7)
Neutrophils Relative %: 74 %
Platelet Count: 165 10*3/uL (ref 150–400)
RBC: 3.91 MIL/uL (ref 3.87–5.11)
RDW: 16.2 % — ABNORMAL HIGH (ref 11.5–15.5)
WBC Count: 5.4 10*3/uL (ref 4.0–10.5)
nRBC: 0 % (ref 0.0–0.2)

## 2023-11-04 LAB — FERRITIN: Ferritin: 1166 ng/mL — ABNORMAL HIGH (ref 11–307)

## 2023-11-04 LAB — CMP (CANCER CENTER ONLY)
ALT: 16 U/L (ref 0–44)
AST: 20 U/L (ref 15–41)
Albumin: 3.9 g/dL (ref 3.5–5.0)
Alkaline Phosphatase: 113 U/L (ref 38–126)
Anion gap: 15 (ref 5–15)
BUN: 37 mg/dL — ABNORMAL HIGH (ref 8–23)
CO2: 29 mmol/L (ref 22–32)
Calcium: 9.1 mg/dL (ref 8.9–10.3)
Chloride: 95 mmol/L — ABNORMAL LOW (ref 98–111)
Creatinine: 5.1 mg/dL — ABNORMAL HIGH (ref 0.44–1.00)
GFR, Estimated: 9 mL/min — ABNORMAL LOW (ref >60)
Glucose, Bld: 140 mg/dL — ABNORMAL HIGH (ref 70–99)
Potassium: 4.7 mmol/L (ref 3.5–5.1)
Sodium: 139 mmol/L (ref 135–145)
Total Bilirubin: 0.3 mg/dL (ref 0.0–1.2)
Total Protein: 6.9 g/dL (ref 6.5–8.1)

## 2023-11-04 LAB — LACTATE DEHYDROGENASE: LDH: 252 U/L — ABNORMAL HIGH (ref 98–192)

## 2023-11-04 LAB — IRON AND IRON BINDING CAPACITY (CC-WL,HP ONLY)
Iron: 47 ug/dL (ref 28–170)
Saturation Ratios: 17 % (ref 10.4–31.8)
TIBC: 281 ug/dL (ref 250–450)
UIBC: 234 ug/dL (ref 148–442)

## 2023-11-04 LAB — SAMPLE TO BLOOD BANK

## 2023-11-04 MED ORDER — SODIUM CHLORIDE 0.9 % IV SOLN
Freq: Once | INTRAVENOUS | Status: AC
Start: 2023-11-04 — End: 2023-11-04

## 2023-11-04 MED ORDER — SODIUM CHLORIDE 0.9 % IV SOLN
1200.0000 mg | Freq: Once | INTRAVENOUS | Status: AC
  Administered 2023-11-04: 1200 mg via INTRAVENOUS
  Filled 2023-11-04: qty 20

## 2023-11-04 NOTE — Progress Notes (Signed)
 Hematology and Oncology Follow Up Visit  Amber Cross [id number] 05-24-1961 63 y.o. 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  She has not feeling all that well.  She has little bit of shortness of breath.  I think some of this might be from the loculated pleural effusion that she has.  We found this on her PET scan.  The PET scan was done on 10/24/2023.  The PET scan showed the moderate loculated right pleural effusion.  There is no definitive metabolic activity to suggest malignancy.  Everything else looked okay.  She has moderate cardiomegaly with trace pericardial effusion.  Still think she is doing okay with respect to the small cell lung cancer..  She still getting her dialysis.  She is managing this okay.  She is not having any obvious pain.  Her appetite is down a little bit.  I think she may have lost a little bit more weight.  There is no problems with her bowels.  I think she is still making a little bit of urine.  She does not have any leg swelling.  Overall, I will have to say that her performance status is probably ECOG 2.   Wt Readings from Last 3 Encounters:  11/04/23 120 lb (54.4 kg)  10/14/23 127 lb 12.8 oz (58 kg)  09/23/23 121 lb 12.8 oz (55.2 kg)     Medications:  Current Outpatient Medications:    albuterol (VENTOLIN HFA) 108 (90 Base) MCG/ACT inhaler, Inhale 2 puffs into the lungs every 6 (six) hours as needed for wheezing or shortness of breath. (Patient not taking: Reported on 10/21/2023), Disp: 6.7  g, Rfl: 2   amLODipine (NORVASC) 10 MG tablet, Take 1 tablet (10 mg total) by mouth daily., Disp: 90 tablet, Rfl: 3   amlodipine-olmesartan (AZOR) 10-20 MG tablet, Take 1 tablet by mouth at bedtime., Disp: , Rfl:    atorvastatin (LIPITOR) 80 MG tablet, Take 1 tablet (80 mg total) by mouth daily., Disp: 90 tablet, Rfl: 3   Blood Glucose Calibration (MYGLUCOHEALTH CONTROL) SOLN, , Disp: , Rfl:    Calcium Acetate 668 (169 Ca) MG TABS, Take 1 tablet by mouth 3 (three) times daily., Disp: , Rfl:    Continuous Glucose Sensor (DEXCOM G7 SENSOR) MISC, Apply each device on your skin every 10 days (Patient not taking: Reported on 10/21/2023), Disp: 3 each, Rfl: 2   dexamethasone (DECADRON) 4 MG tablet, Take 2 tabs by mouth starting day after last dose of etoposide for one day only. Repeat every 21 days. Take with food., Disp: 8 tablet, Rfl: 0   dronabinol (MARINOL) 2.5 MG capsule, Take 1 capsule (2.5 mg total) by mouth 2 (two) times daily before a meal., Disp: 60 capsule, Rfl: 0   furosemide (LASIX) 40 MG tablet, Take 1 tablet (40 mg total) by mouth daily as needed (swelling in legs or weight gain)., Disp: 30 tablet, Rfl: 1   glucose blood (PRECISION QID TEST) test strip, , Disp: , Rfl:    hydrALAZINE (APRESOLINE) 50 MG tablet, Take 1 tablet (50 mg total) by mouth every  8 (eight) hours., Disp: 180 tablet, Rfl: 3   Lancet Devices (ADJUSTABLE LANCING DEVICE) MISC, , Disp: , Rfl:    losartan (COZAAR) 25 MG tablet, Take 1 tablet (25 mg total) by mouth daily., Disp: 30 tablet, Rfl: 11   megestrol (MEGACE ES) 625 MG/5ML suspension, Take 5 mLs (625 mg total) by mouth daily., Disp: 150 mL, Rfl: 4   ondansetron (ZOFRAN) 8 MG tablet, Take 1 tablet (8 mg total) by mouth every 8 (eight) hours as needed for nausea, vomiting or refractory nausea / vomiting. Start on the third day after carboplatin. (Patient not taking: Reported on 09/23/2023), Disp: 30 tablet, Rfl: 1   pantoprazole (PROTONIX) 40 MG tablet, Take 40 mg by mouth  daily., Disp: , Rfl:    prednisoLONE acetate (PRED FORTE) 1 % ophthalmic suspension, Place 1 drop into the left eye 4 (four) times daily., Disp: , Rfl:    prochlorperazine (COMPAZINE) 10 MG tablet, Take 1 tablet (10 mg total) by mouth every 6 (six) hours as needed for nausea or vomiting. (Patient not taking: Reported on 09/23/2023), Disp: 30 tablet, Rfl: 1   sevelamer carbonate (RENVELA) 800 MG tablet, Take 800 mg by mouth 3 (three) times daily with meals., Disp: , Rfl:    traZODone (DESYREL) 50 MG tablet, Take 1 tablet (50 mg total) by mouth at bedtime., Disp: 90 tablet, Rfl: 1  Allergies:  Allergies  Allergen Reactions   Penicillins Itching and Other (See Comments)    redness   Strawberry (Diagnostic) Itching and Swelling   Tomato Itching and Swelling    Past Medical History, Surgical history, Social history, and Family History were reviewed and updated.  Review of Systems: Review of Systems  Constitutional:  Positive for unexpected weight change. Negative for appetite change.  HENT:  Negative.    Eyes: Negative.   Respiratory: Negative.    Cardiovascular: Negative.   Gastrointestinal: Negative.   Endocrine: Negative.   Genitourinary: Negative.    Musculoskeletal: Negative.   Skin: Negative.   Neurological:  Positive for headaches.  Hematological: Negative.   Psychiatric/Behavioral: Negative.      Physical Exam:  height is 5\' 7"  (1.702 m) and weight is 120 lb (54.4 kg). Her oral temperature is 98.7 F (37.1 C). Her blood pressure is 163/71 (abnormal) and her pulse is 99. Her respiration is 18 and oxygen saturation is 100%.   Wt Readings from Last 3 Encounters:  11/04/23 120 lb (54.4 kg)  10/14/23 127 lb 12.8 oz (58 kg)  09/23/23 121 lb 12.8 oz (55.2 kg)    Physical Exam Vitals reviewed.  HENT:     Head: Normocephalic and atraumatic.  Eyes:     Pupils: Pupils are equal, round, and reactive to light.  Cardiovascular:     Rate and Rhythm: Normal rate and regular  rhythm.     Heart sounds: Normal heart sounds.  Pulmonary:     Effort: Pulmonary effort is normal.     Breath sounds: Normal breath sounds.  Abdominal:     General: Bowel sounds are normal.     Palpations: Abdomen is soft.  Musculoskeletal:        General: No tenderness or deformity. Normal range of motion.     Cervical back: Normal range of motion.  Lymphadenopathy:     Cervical: No cervical adenopathy.  Skin:    General: Skin is warm and dry.     Findings: No erythema or rash.  Neurological:     Mental Status: She is alert and  oriented to person, place, and time.  Psychiatric:        Behavior: Behavior normal.        Thought Content: Thought content normal.        Judgment: Judgment normal.      Lab Results  Component Value Date   WBC 5.4 11/04/2023   HGB 11.7 (L) 11/04/2023   HCT 37.5 11/04/2023   MCV 95.9 11/04/2023   PLT 165 11/04/2023     Chemistry      Component Value Date/Time   NA 139 11/04/2023 1025   NA 141 03/11/2023 1220   K 4.7 11/04/2023 1025   CL 95 (L) 11/04/2023 1025   CO2 29 11/04/2023 1025   BUN 37 (H) 11/04/2023 1025   BUN 26 03/11/2023 1220   CREATININE 5.10 (H) 11/04/2023 1025      Component Value Date/Time   CALCIUM 9.1 11/04/2023 1025   ALKPHOS 113 11/04/2023 1025   AST 20 11/04/2023 1025   ALT 16 11/04/2023 1025   BILITOT 0.3 11/04/2023 1025      Impression and Plan: Ms. Gallacher is a very nice 63 year old African-American female.  She has history of limited stage small cell lung cancer in addition to ESRD.  Unfortunately, because of her overall health status, she really could not have combined radiation therapy and chemotherapy.  Hopefully, this pleural effusion is not malignant.  We had to have this drained.  Hopefully we can get some fluid taken out.  I will try to get this set up for this week.  Will continue on the Tecentriq for right now.   I am happy that her hemoglobin is doing so well.  She does not need any Aranesp  right now.  We will plan to get her back in another 3 weeks.  Josph Macho, MD 4/8/202511:32 AM

## 2023-11-04 NOTE — Patient Instructions (Signed)
 [facility] - A DEPT OF [facility]  Discharge Instructions: Thank you for choosing [facility] to provide your oncology and hematology care.   If you have a lab appointment with the Cancer Center, please go directly to the Cancer Center and check in at the registration area.  Wear comfortable clothing and clothing appropriate for easy access to any Portacath or PICC line.   We strive to give you quality time with your provider. You may need to reschedule your appointment if you arrive late (15 or more minutes).  Arriving late affects you and other patients whose appointments are after yours.  Also, if you miss three or more appointments without notifying the office, you may be dismissed from the clinic at the provider's discretion.      For prescription refill requests, have your pharmacy contact our office and allow 72 hours for refills to be completed.    Today you received the following chemotherapy and/or immunotherapy agents Tecentriq      To help prevent nausea and vomiting after your treatment, we encourage you to take your nausea medication as directed.  BELOW ARE SYMPTOMS THAT SHOULD BE REPORTED IMMEDIATELY: *FEVER GREATER THAN 100.4 F (38 C) OR HIGHER *CHILLS OR SWEATING *NAUSEA AND VOMITING THAT IS NOT CONTROLLED WITH YOUR NAUSEA MEDICATION *UNUSUAL SHORTNESS OF BREATH *UNUSUAL BRUISING OR BLEEDING *URINARY PROBLEMS (pain or burning when urinating, or frequent urination) *BOWEL PROBLEMS (unusual diarrhea, constipation, pain near the anus) TENDERNESS IN MOUTH AND THROAT WITH OR WITHOUT PRESENCE OF ULCERS (sore throat, sores in mouth, or a toothache) UNUSUAL RASH, SWELLING OR PAIN  UNUSUAL VAGINAL DISCHARGE OR ITCHING   Items with * indicate a potential emergency and should be followed up as soon as possible or go to the Emergency Department if any problems should occur.  Please show the CHEMOTHERAPY ALERT CARD or IMMUNOTHERAPY  ALERT CARD at check-in to the Emergency Department and triage nurse. Should you have questions after your visit or need to cancel or reschedule your appointment, please contact [facility] - A DEPT OF [facility]  [phone] and follow the prompts.  Office hours are 8:00 a.m. to 4:30 p.m. Monday - Friday. Please note that voicemails left after 4:00 p.m. may not be returned until the following business day.  We are closed weekends and major holidays. You have access to a nurse at all times for urgent questions. Please call the main number to the clinic [phone] and follow the prompts.  For any non-urgent questions, you may also contact your provider using MyChart. We now offer e-Visits for anyone 74 and older to request care online for non-urgent symptoms. For details visit [email or web address].   Also download the MyChart app! Go to the app store, search "MyChart", open the app, select [facility], and log in with your MyChart username and password.

## 2023-11-04 NOTE — Progress Notes (Signed)
 OK to treat with creat-5.10 per order of Dr. Myna Hidalgo.

## 2023-11-04 NOTE — Addendum Note (Signed)
 Addended by: Margaretha Seeds on: 11/04/2023 02:02 PM   Modules accepted: Orders

## 2023-11-05 DIAGNOSIS — N186 End stage renal disease: Secondary | ICD-10-CM | POA: Diagnosis not present

## 2023-11-05 DIAGNOSIS — Z992 Dependence on renal dialysis: Secondary | ICD-10-CM | POA: Diagnosis not present

## 2023-11-05 DIAGNOSIS — N2581 Secondary hyperparathyroidism of renal origin: Secondary | ICD-10-CM | POA: Diagnosis not present

## 2023-11-06 ENCOUNTER — Other Ambulatory Visit: Payer: Self-pay

## 2023-11-07 DIAGNOSIS — N2581 Secondary hyperparathyroidism of renal origin: Secondary | ICD-10-CM | POA: Diagnosis not present

## 2023-11-07 DIAGNOSIS — Z992 Dependence on renal dialysis: Secondary | ICD-10-CM | POA: Diagnosis not present

## 2023-11-07 DIAGNOSIS — N186 End stage renal disease: Secondary | ICD-10-CM | POA: Diagnosis not present

## 2023-11-10 DIAGNOSIS — N2581 Secondary hyperparathyroidism of renal origin: Secondary | ICD-10-CM | POA: Diagnosis not present

## 2023-11-10 DIAGNOSIS — N186 End stage renal disease: Secondary | ICD-10-CM | POA: Diagnosis not present

## 2023-11-10 DIAGNOSIS — Z992 Dependence on renal dialysis: Secondary | ICD-10-CM | POA: Diagnosis not present

## 2023-11-11 ENCOUNTER — Ambulatory Visit (HOSPITAL_COMMUNITY)
Admission: RE | Admit: 2023-11-11 | Discharge: 2023-11-11 | Disposition: A | Discharge status: Home / Self Care | Source: Ambulatory Visit | Attending: Radiology | Admitting: Radiology

## 2023-11-11 ENCOUNTER — Ambulatory Visit (HOSPITAL_COMMUNITY)
Admission: RE | Admit: 2023-11-11 | Discharge: 2023-11-11 | Disposition: A | Discharge status: Home / Self Care | Source: Ambulatory Visit | Attending: Hematology & Oncology | Admitting: Hematology & Oncology

## 2023-11-11 VITALS — BP 159/76

## 2023-11-11 DIAGNOSIS — J90 Pleural effusion, not elsewhere classified: Secondary | ICD-10-CM | POA: Diagnosis not present

## 2023-11-11 DIAGNOSIS — I517 Cardiomegaly: Secondary | ICD-10-CM | POA: Diagnosis not present

## 2023-11-11 DIAGNOSIS — C3411 Malignant neoplasm of upper lobe, right bronchus or lung: Secondary | ICD-10-CM | POA: Insufficient documentation

## 2023-11-11 DIAGNOSIS — C3490 Malignant neoplasm of unspecified part of unspecified bronchus or lung: Secondary | ICD-10-CM | POA: Diagnosis not present

## 2023-11-11 DIAGNOSIS — Z48813 Encounter for surgical aftercare following surgery on the respiratory system: Secondary | ICD-10-CM | POA: Diagnosis not present

## 2023-11-11 DIAGNOSIS — R846 Abnormal cytological findings in specimens from respiratory organs and thorax: Secondary | ICD-10-CM | POA: Diagnosis not present

## 2023-11-11 MED ORDER — LIDOCAINE HCL 1 % IJ SOLN
INTRAMUSCULAR | Status: AC
  Filled 2023-11-11: qty 20

## 2023-11-11 NOTE — Progress Notes (Signed)
 [facility] - Clinic Note  11/25/2023     CHIEF COMPLAINT Patient presents for Retina Follow Up   HISTORY OF PRESENT ILLNESS: Amber Cross is a 63 y.o. female who presents to the clinic today for:  HPI     Retina Follow Up   Patient presents with  Diabetic Retinopathy.  In both eyes.  This started 1 year ago.  Duration of 5 weeks.  Since onset it is stable.  I, the attending physician,  performed the HPI with the patient and updated documentation appropriately.        Comments   Pt states she has noticed some improvement in her vision, she is not noticing the black spots and she picked up new glasses yesterday. Pt's last A1c was 7.0 on 11/23/23. Pt states she is not using any ats at this time.      Last edited by Ronelle Coffee, MD on 11/25/2023  4:25 PM.     Referring physician: Franky Ivanoff, PA-C  [facility] [address] [place],  [place] [address]  HISTORICAL INFORMATION:  Selected notes from the MEDICAL RECORD NUMBER Referred by Franky Ivanoff, PA-C for concern of CME / PDR LEE:  Ocular Hx- PMH-   CURRENT MEDICATIONS: Current Outpatient Medications (Ophthalmic Drugs)  Medication Sig   prednisoLONE acetate (PRED FORTE) 1 % ophthalmic suspension Place 1 drop into the left eye 4 (four) times daily.   No current facility-administered medications for this visit. (Ophthalmic Drugs)   Current Outpatient Medications (Other)  Medication Sig   amlodipine -olmesartan  (AZOR ) 10-20 MG tablet Take 1 tablet by mouth at bedtime.   atorvastatin  (LIPITOR ) 80 MG tablet Take 1 tablet (80 mg total) by mouth daily.   Calcium  Acetate 668 (169 Ca) MG TABS Take 1 tablet by mouth 3 (three) times daily.   dronabinol  (MARINOL ) 2.5 MG capsule Take 1 capsule (2.5 mg total) by mouth 2 (two) times daily before a meal.   fluticasone  (FLONASE ) 50 MCG/ACT nasal spray Place 2 sprays into both nostrils daily.   gabapentin  (NEURONTIN ) 600 MG tablet  Take 600 mg by mouth daily.   hydrALAZINE  (APRESOLINE ) 100 MG tablet Take 1 tablet (100 mg total) by mouth 2 (two) times daily.   Lancet Devices (ADJUSTABLE LANCING DEVICE) MISC    megestrol  (MEGACE  ES) 625 MG/5ML suspension Take 5 mLs (625 mg total) by mouth daily.   pantoprazole  (PROTONIX ) 40 MG tablet Take 40 mg by mouth daily.   sevelamer  carbonate (RENVELA ) 800 MG tablet Take 800 mg by mouth 3 (three) times daily with meals.   traZODone  (DESYREL ) 50 MG tablet Take 1 tablet (50 mg total) by mouth at bedtime.   furosemide  (LASIX ) 40 MG tablet Take 1 tablet (40 mg total) by mouth daily as needed (swelling in legs or weight gain).   No current facility-administered medications for this visit. (Other)   REVIEW OF SYSTEMS: ROS   Positive for: Musculoskeletal, Endocrine, Cardiovascular, Eyes Negative for: Constitutional, Gastrointestinal, Neurological, Skin, Genitourinary, HENT, Respiratory, Psychiatric, Allergic/Imm, Heme/Lymph Last edited by Carrington Clack, COT on 11/25/2023  2:57 PM.     ALLERGIES Allergies  Allergen Reactions   Penicillins Itching and Other (See Comments)    redness   Strawberry (Diagnostic) Itching and Swelling   Tomato Itching and Swelling   PAST MEDICAL HISTORY Past Medical History:  Diagnosis Date   Anemia    Anemia of chronic renal failure, stage 4 (severe) (HCC) 07/08/2023   Arthritis  Chest pain 05/07/2021   CHF (congestive heart failure) (HCC)    Chronic kidney disease    dialysis M-W-F   Depression    Diabetes mellitus    Headache    Hypercholesteremia    Hypertension    Hypertensive emergency 06/16/2022   Lung cancer (HCC) 12/18/2021   RUL small cell carcinoma, s/p radiation   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   Postop check 08/05/2022   Pseudogout    Stroke (HCC) 10/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   Tamponade 06/18/2022   Tobacco abuse    Past Surgical History:  Procedure Laterality Date   ANTERIOR CRUCIATE LIGAMENT REPAIR Right    AV  FISTULA PLACEMENT Left 07/01/2022   Procedure: LEFT ARTERIOVENOUS (AV) FISTULA CREATION;  Surgeon: Dannis Dy, MD;  Location: [facility] OR;  Service: Vascular;  Laterality: Left;  with regional block   BRONCHIAL BIOPSY  12/18/2021   Procedure: BRONCHIAL BIOPSIES;  Surgeon: Prudy Brownie, DO;  Location: [facility];  Service: Pulmonary;;   BRONCHIAL BRUSHINGS  12/18/2021   Procedure: BRONCHIAL BRUSHINGS;  Surgeon: Prudy Brownie, DO;  Location: [facility];  Service: Pulmonary;;   BRONCHIAL NEEDLE ASPIRATION BIOPSY  12/18/2021   Procedure: BRONCHIAL NEEDLE ASPIRATION BIOPSIES;  Surgeon: Prudy Brownie, DO;  Location: [facility];  Service: Pulmonary;;   CATARACT EXTRACTION     CHEST TUBE INSERTION Right 06/18/2022   Procedure: CHEST TUBE INSERTION;  Surgeon: Shon Downing, MD;  Location: [facility] OR;  Service: Thoracic;  Laterality: Right;   EXCHANGE OF A DIALYSIS CATHETER N/A 08/08/2022   Procedure: EXCHANGE OF A TUNNELED DIALYSIS CATHETER;  Surgeon: Margherita Shell, MD;  Location: [facility] OR;  Service: Vascular;  Laterality: N/A;   FIDUCIAL MARKER PLACEMENT  12/18/2021   Procedure: FIDUCIAL MARKER PLACEMENT;  Surgeon: Prudy Brownie, DO;  Location: [facility];  Service: Pulmonary;;   IR FLUORO GUIDE CV LINE RIGHT  06/26/2022   IR US  GUIDE VASC ACCESS RIGHT  06/26/2022   OVARY SURGERY     RIGHT OOPHORECTOMY Right    SMALL INTESTINE SURGERY     SUBXYPHOID PERICARDIAL WINDOW N/A 06/18/2022   Procedure: SUBXYPHOID PERICARDIAL WINDOW;  Surgeon: Shon Downing, MD;  Location: [facility] OR;  Service: Thoracic;  Laterality: N/A;   TEE WITHOUT CARDIOVERSION N/A 06/18/2022   Procedure: TRANSESOPHAGEAL ECHOCARDIOGRAM (TEE);  Surgeon: Shon Downing, MD;  Location: [facility] OR;  Service: Thoracic;  Laterality: N/A;   TUBAL LIGATION     VIDEO BRONCHOSCOPY WITH RADIAL ENDOBRONCHIAL ULTRASOUND  12/18/2021   Procedure: RADIAL ENDOBRONCHIAL ULTRASOUND;  Surgeon: Prudy Brownie, DO;  Location: [facility];   Service: Pulmonary;;   FAMILY HISTORY Family History  Problem Relation Age of Onset   Diabetes Mother    Hypertension Mother    Hypertension Father    SOCIAL HISTORY Social History   Tobacco Use   Smoking status: Every Day    Current packs/day: 0.50    Average packs/day: 0.5 packs/day for 40.0 years (20.0 ttl pk-yrs)    Types: Cigarettes   Smokeless tobacco: Never   Tobacco comments:    Cutting back 1/4-1/2 ppd  Vaping Use   Vaping status: Never Used  Substance Use Topics   Alcohol use: Not Currently   Drug use: Yes    Frequency: 4.0 times per week    Types: Marijuana    Comment: smokes every other day      OPHTHALMIC EXAM:  Base Eye Exam     Visual Acuity (Snellen - Linear)  Right Left   Dist cc 20/25 +2 20/20 -2   Dist ph cc NI NI    Correction: Glasses         Tonometry (Tonopen, 3:02 PM)       Right Left   Pressure 18 16         Pupils       Pupils Dark Light Shape React APD   Right PERRL 2 2 Round Minimal None   Left PERRL 2 2 Round Minimal None         Visual Fields       Left Right    Full Full         Extraocular Movement       Right Left    Full, Ortho Full, Ortho         Neuro/Psych     Oriented x3: Yes   Mood/Affect: Normal         Dilation     Both eyes: 1.0% Mydriacyl, 2.5% Phenylephrine  @ 3:03 PM           Slit Lamp and Fundus Exam     Slit Lamp Exam       Right Left   Lids/Lashes Dermatochalasis - upper lid, mild MGD Dermatochalasis - upper lid, mild MGD   Conjunctiva/Sclera nasal pingeucula, Melanosis nasal pingeucula, mild Melanosis   Cornea arcus, well healed cataract wound, nasal LRI, trace Punctate epithelial erosions arcus, trace Punctate epithelial erosions, trace tear film debris, well healed cataract wound, fine endo pigment   Anterior Chamber deep and clear deep and clear, no cell or flare   Iris Round and dilated Round and dilated   Lens PC IOL in good position PC IOL in good  position   Anterior Vitreous mild syneresis, Posterior vitreous detachment mild syneresis         Fundus Exam       Right Left   Disc Pink and Sharp, temporal pallor, temporal PPP Pink and Sharp, temporal pallor, temporal PPP, no NVD, +SVP   C/D Ratio 0.3 0.3   Macula Good foveal reflex, scattered MA, DBH and punctate exudates -- improved, +cystic changes good foveal reflex, dense central cluster of exudates -- improved, scattered MA, DBH and central edema   Vessels attenuated, Tortuous, no NV attenuated, Tortuous, no NV   Periphery Attached, scattered MA Attached, rare MA           Refraction     Wearing Rx       Sphere Cylinder Axis Add   Right +1.50 +0.50 167 +2.50   Left -0.50 +0.50 121 +2.50    Age: >1   Type: Bifocal           IMAGING AND PROCEDURES  Imaging and Procedures for 11/25/2023  OCT, Retina - OU - Both Eyes       Right Eye Quality was good. Central Foveal Thickness: 230. Progression has been stable. Findings include normal foveal contour, no SRF, intraretinal hyper-reflective material, intraretinal fluid (persistent IRF / IRHM greatest temporal and superior mac ).   Left Eye Quality was good. Central Foveal Thickness: 183. Progression has been stable. Findings include no SRF, abnormal foveal contour, subretinal hyper-reflective material, intraretinal hyper-reflective material, intraretinal fluid, outer retinal atrophy (Persistent IRF/IRHM greatest IT macula ).   Notes *Images captured and stored on drive  Diagnosis / Impression:  DME OU OD: persistent IRF / IRHM greatest temporal and superior mac  OS: Persistent IRF/IRHM greatest IT macula   Clinical management:  See below  Abbreviations: NFP - Normal foveal profile. CME - cystoid macular edema. PED - pigment epithelial detachment. IRF - intraretinal fluid. SRF - subretinal fluid. EZ - ellipsoid zone. ERM - epiretinal membrane. ORA - outer retinal atrophy. ORT - outer retinal tubulation. SRHM  - subretinal hyper-reflective material. IRHM - intraretinal hyper-reflective material      Intravitreal Injection, Pharmacologic Agent - OD - Right Eye       Time Out 11/25/2023. 4:14 PM. Confirmed correct patient, procedure, site, and patient consented.   Anesthesia Topical anesthesia was used. Anesthetic medications included Lidocaine  2%, Proparacaine 0.5%.   Procedure Preparation included 5% betadine to ocular surface, eyelid speculum. A supplied (32g) needle was used.   Injection: 1.25 mg Bevacizumab  1.25mg /0.31ml   Route: Intravitreal, Site: Right Eye   NDC: [id number], Lot: [id number], Expiration date: 12/25/2023   Post-op Post injection exam found visual acuity of at least counting fingers. The patient tolerated the procedure well. There were no complications. The patient received written and verbal post procedure care education.      Intravitreal Injection, Pharmacologic Agent - OS - Left Eye       Time Out 11/25/2023. 4:14 PM. Confirmed correct patient, procedure, site, and patient consented.   Anesthesia Topical anesthesia was used. Anesthetic medications included Lidocaine  2%, Proparacaine 0.5%.   Procedure Preparation included 5% betadine to ocular surface, eyelid speculum. A (32g) needle was used.   Injection: 2 mg aflibercept  2 MG/0.05ML   Route: Intravitreal, Site: Left Eye   NDC: D2246706, Lot: [id number], Expiration date: 11/25/2024, Waste: 0 mL   Post-op Post injection exam found visual acuity of at least counting fingers. The patient tolerated the procedure well. There were no complications. The patient received written and verbal post procedure care education.      CBC with Differential (Cancer Center Only)      Component Value Flag Ref Range Units Status   WBC Count 4.3      4.0 - 10.5 K/uL Final   RBC 3.62      3.87 - 5.11 MIL/uL Final   Hemoglobin 10.9      12.0 - 15.0 g/dL Final   HCT 82.9      56.2 - 46.0 % Final   MCV 95.0      80.0 - 100.0  fL Final   MCH 30.1      26.0 - 34.0 pg Final   MCHC 31.7      30.0 - 36.0 g/dL Final   RDW 13.0      86.5 - 15.5 % Final   Platelet Count 147      150 - 400 K/uL Final   nRBC 0.0      0.0 - 0.2 % Final   Neutrophils Relative % 67       % Final   Neutro Abs 2.9      1.7 - 7.7 K/uL Final   Lymphocytes Relative 22       % Final   Lymphs Abs 0.9      0.7 - 4.0 K/uL Final   Monocytes Relative 8       % Final   Monocytes Absolute 0.3      0.1 - 1.0 K/uL Final   Eosinophils Relative 2       % Final   Eosinophils Absolute 0.1      0.0 - 0.5 K/uL Final   Basophils Relative 1       % Final   Basophils Absolute 0.0  0.0 - 0.1 K/uL Final   Immature Granulocytes 0       % Final   Abs Immature Granulocytes 0.01      0.00 - 0.07 K/uL Final   Comment:   Performed at [facility], [address] [place], [place] [address]           CMP (Cancer Center only)      Component Value Flag Ref Range Units Status   Sodium 139      135 - 145 mmol/L Final   Potassium 4.9      3.5 - 5.1 mmol/L Final   Chloride 101      98 - 111 mmol/L Final   CO2 24      22 - 32 mmol/L Final   Glucose, Bld 131      70 - 99 mg/dL Final   Comment:   Glucose reference range applies only to samples taken after fasting for at least 8 hours.   BUN 32      8 - 23 mg/dL Final   Creatinine 7.42      0.44 - 1.00 mg/dL Final   Calcium  8.6      8.9 - 10.3 mg/dL Final   Total Protein 6.9      6.5 - 8.1 g/dL Final   Albumin  3.0      3.5 - 5.0 g/dL Final   AST 13      15 - 41 U/L Final   ALT 13      0 - 44 U/L Final   Alkaline Phosphatase 76      38 - 126 U/L Final   Total Bilirubin 0.2      0.0 - 1.2 mg/dL Final   GFR, Estimated 9      >60 mL/min Final   Comment:   (NOTE) Calculated using the CKD-EPI Creatinine Equation (2021)    Anion gap 14      5 - 15  Final   Comment:   Performed at [facility], [address] [place], [place] [address]            Lactate dehydrogenase      Component Value Flag Ref Range Units Status   LDH 199      98 - 192 U/L Final   Comment:   Performed at [facility], [address] [place], [place] [address]           Sample to Blood Bank      Component   Blood Bank Specimen   SAMPLE AVAILABLE FOR TESTING     Sample Expiration   11/28/2023,2359 Performed at [facility], [address], [place], [place] [address]            Iron and Iron Binding Capacity ([facility] only)      Component Value Flag Ref Range Units Status   Iron 119      28 - 170 ug/dL Final   TIBC 188      416 - 450 ug/dL Final   Saturation Ratios 42      10.4 - 31.8 % Final   UIBC 162      148 - 442 ug/dL Final   Comment:   Performed at [facility], [address] [place], [place] [address]  ASSESSMENT/PLAN:   ICD-10-CM   1. Severe nonproliferative diabetic retinopathy of both eyes with macular edema associated with type 2 diabetes mellitus (HCC)  E11.3413 OCT, Retina - OU - Both Eyes    Intravitreal Injection, Pharmacologic Agent - OD - Right Eye    Intravitreal Injection, Pharmacologic Agent - OS - Left Eye    aflibercept  (EYLEA ) SOLN 2 mg    Bevacizumab  (AVASTIN ) SOLN 1.25 mg    2. Current use of insulin  (HCC)  Z79.4     3. Essential hypertension  I10     4. Hypertensive retinopathy of both eyes  H35.033     5. Pseudophakia, both eyes  Z96.1      1,2 Severe Non-proliferative diabetic retinopathy, both eyes  - A1C 7.8 (08.13.24), 10.4 (05.09.24), 6.7 (11.30.23) - s/p IVA OD #1 (05.02.24), #2 (05.30.24), #3 (07.24.24), #4 (08.21.24), #5 (10.08.24), #6 (11.05.24), #7 (12.10.24), #8 (01.14.25), #9 (02.18.25), #10 (03.25.25) - s/p IVA OS #1 (04.04.24), #2 (05.02.24), #3 (05.30.24), #4 (07.24.24), #5 (08.21.24) -- IVA resistance OS - s/p IVE OS #1 (10.08.24), #2 (11.05.24), #3 (12.10.24), #4 (01.14.25),  #5 (02.18.25), #6 (03.25.25) - BCVA OD 20/25 from 20/30, OS improved to 20/20 from 20/30 - exam shows scattered MA/DBH and exudates OU (OS > OD) - FA (04.04.24) shows leaking MA greatest posteriorly, no NV OU - OCT shows  OD: persistent IRF / IRHM greatest temporal and superior mac; OS: Persistent IRF/IRHM greatest IT macula at 5 weeks - recommend IVA OD #11 and IVE OS #7 today,04.29.25 for DME w/ f/u in 5 wks again - pt wishes to proceed - RBA of procedure discussed, questions answered - Eylea  approved with insurance - IVA informed consent obtained and signed, 04.04.24 (OU) - IVE informed consent obtained and signed, 10.08.24 (OU) - see procedure note - f/u in 5 wks -- DFE/OCT, possible injection  3,4. Hypertensive retinopathy OU - discussed importance of tight BP control - monitor  5. Pseudophakia OU  - s/p CE/IOL OU (Dr. Candi Chafe, OD: 2017, OS: 03.04.25)  - IOL in good position, doing well  - monitor  Ophthalmic Meds Ordered this visit:  Meds ordered this encounter  Medications   aflibercept  (EYLEA ) SOLN 2 mg   Bevacizumab  (AVASTIN ) SOLN 1.25 mg    Return in about 5 weeks (around 12/30/2023) for f/u NPDR OU, DFE, OCT, Possible Injxn.  There are no Patient Instructions on file for this visit.  Explained the diagnoses, plan, and follow up with the patient and they expressed understanding.  Patient expressed understanding of the importance of proper follow up care.   This document serves as a record of services personally performed by Jeanice Millard, MD, PhD. It was created on their behalf by Olene Berne, COT an ophthalmic technician. The creation of this record is the provider's dictation and/or activities during the visit.    Electronically signed by:  Olene Berne, COT  11/25/23 4:36 PM  This document serves as a record of services personally performed by Jeanice Millard, MD, PhD. It was created on their behalf by Morley Arabia. Bevin Bucks, OA an ophthalmic technician. The  creation of this record is the provider's dictation and/or activities during the visit.    Electronically signed by: Morley Arabia. Bevin Bucks, OA 11/25/23 4:36 PM  Jeanice Millard, M.D., Ph.D. Diseases & Surgery of the Retina and Vitreous [facility]  I have reviewed the above documentation for accuracy and completeness, and I agree with the above. Virgene Griffin  Karyl Paget, M.D., Ph.D. 11/25/23 4:38 PM   Abbreviations: M myopia (nearsighted); A astigmatism; H hyperopia (farsighted); P presbyopia; Mrx spectacle prescription;  CTL contact lenses; OD right eye; OS left eye; OU both eyes  XT exotropia; ET esotropia; PEK punctate epithelial keratitis; PEE punctate epithelial erosions; DES dry eye syndrome; MGD meibomian gland dysfunction; ATs artificial tears; PFAT's preservative free artificial tears; NSC nuclear sclerotic cataract; PSC posterior subcapsular cataract; ERM epi-retinal membrane; PVD posterior vitreous detachment; RD retinal detachment; DM diabetes mellitus; DR diabetic retinopathy; NPDR non-proliferative diabetic retinopathy; PDR proliferative diabetic retinopathy; CSME clinically significant macular edema; DME diabetic macular edema; dbh dot blot hemorrhages; CWS cotton wool spot; POAG primary open angle glaucoma; C/D cup-to-disc ratio; HVF humphrey visual field; GVF goldmann visual field; OCT optical coherence tomography; IOP intraocular pressure; BRVO Branch retinal vein occlusion; CRVO central retinal vein occlusion; CRAO central retinal artery occlusion; BRAO branch retinal artery occlusion; RT retinal tear; SB scleral buckle; PPV pars plana vitrectomy; VH Vitreous hemorrhage; PRP panretinal laser photocoagulation; IVK intravitreal kenalog; VMT vitreomacular traction; MH Macular hole;  NVD neovascularization of the disc; NVE neovascularization elsewhere; AREDS age related eye disease study; ARMD age related macular degeneration; POAG primary open angle glaucoma; EBMD  epithelial/anterior basement membrane dystrophy; ACIOL anterior chamber intraocular lens; IOL intraocular lens; PCIOL posterior chamber intraocular lens; Phaco/IOL phacoemulsification with intraocular lens placement; PRK photorefractive keratectomy; LASIK laser assisted in situ keratomileusis; HTN hypertension; DM diabetes mellitus; COPD chronic obstructive pulmonary disease

## 2023-11-11 NOTE — Procedures (Signed)
 Ultrasound-guided diagnostic and therapeutic right thoracentesis performed yielding 675 cc  of yellow  fluid. No immediate complications. Follow-up chest x-ray pending. The fluid was sent to the lab for preordered studies. EBL none.

## 2023-11-12 ENCOUNTER — Other Ambulatory Visit: Payer: Self-pay

## 2023-11-12 DIAGNOSIS — N186 End stage renal disease: Secondary | ICD-10-CM | POA: Diagnosis not present

## 2023-11-12 DIAGNOSIS — N2581 Secondary hyperparathyroidism of renal origin: Secondary | ICD-10-CM | POA: Diagnosis not present

## 2023-11-12 DIAGNOSIS — Z992 Dependence on renal dialysis: Secondary | ICD-10-CM | POA: Diagnosis not present

## 2023-11-12 LAB — CYTOLOGY - NON PAP

## 2023-11-12 NOTE — Telephone Encounter (Signed)
 Patient last seen 04/08/23. I called the patient to schedule a fu appointment, I wasn't able to reach her I left her a vm.

## 2023-11-13 ENCOUNTER — Ambulatory Visit: Payer: Self-pay

## 2023-11-13 ENCOUNTER — Encounter: Admitting: Internal Medicine

## 2023-11-13 ENCOUNTER — Telehealth: Payer: Self-pay | Admitting: Student

## 2023-11-13 NOTE — Patient Instructions (Signed)
 Amber Cross,  I am sorry we were not able to successfully connect for your phone visit this afternoon. Please contact the clinic to reschedule this visit, or go to the urgent care for further evaluation.  My best, Dr. Rozelle Corning

## 2023-11-13 NOTE — Telephone Encounter (Signed)
 Patient is scheduled for a telehealth visit this afternoon with Dr.Dean.

## 2023-11-13 NOTE — Progress Notes (Addendum)
 Attempted to reach patient by phone on three occasions without success. I have reached out with [employer] to request that she contact the clinic to reschedule her visit, or go to urgent care for further evaluation.  Malen Scudder, DO

## 2023-11-13 NOTE — Telephone Encounter (Signed)
 This patient has already sch her own appointment:  Name: Amber Cross, Amber Cross MRN: [id number]  Date: 11/20/2023 Status: Sch  Time: 10:20 AM Length: 40  Visit Type: TRANSFER OF CARE [8006] Copay: $0.00  Provider: Trenton Frock, PA-C Department: [facility]  Referring Provider: Fay Hoop CSN: [id number]  Notes: 1 month f/u diabetic /AS, A1c patient stated have no appetite and A1c / patient asking for a physical  Made On: 11/13/2023 10:53 AM By: Sharyle Deer     Called and spoke with the pt Per previous message below:    Copied from CRM [id number]. Topic: Appointments - Transfer of Care >> Nov 13, 2023 10:41 AM Tisa Forester wrote: Pt is [facility] to transfer [facility]: [facility]  Pt is requesting to transfer TO: [facility]  Reason for requested transfer: too far to travel to provider at the [facility]  internal medicine center  It is the responsibility of the team the patient would like to transfer to (Dr. Patient do not have a preference ) to reach out to the patient if for any reason this transfer is not acceptable. >> Nov 13, 2023  1:34 PM Charsetta H wrote: No action is needed on our end.  It is up to the new practice if they accepting new patients.  If patient keeps her future appointment at new practice I will remove our doctor's name as PCP.

## 2023-11-13 NOTE — Telephone Encounter (Signed)
 I called the patient x3 to offer her a telehealth appointment for this afternoon. No answer unable to reach the patient, I lvm for her to give us  a call back to scheduled a appointment.

## 2023-11-13 NOTE — Telephone Encounter (Signed)
 Reason for Triage: patient has worsening symptom have no appetite and a lot of drainage not sure if its sinus issues  * note (patient is current at [facility] and want to transfer to [place] [facility] [place] ) she is need a followup for diabetic and A1c check   Patient  called [phone]    Chief Complaint: Sinus pain, drainage. Asking for virtual visit today, work in. Symptoms: Above Frequency: 2 weeks Pertinent Negatives: Patient denies fever Disposition: [] ED /[] Urgent Care (no appt availability in office) / [] Appointment(In office/virtual)/ []  [place]/ [] Home Care/ [] Refused Recommended Disposition /[] [facility]/ [x]  Follow-up with PCP Additional Notes: Please advise pt.  Reason for Disposition  [1] Nasal discharge AND [2] present > 10 days  [1] Fever returns after gone for over 24 hours AND [2] symptoms worse or not improved  Answer Assessment - Initial Assessment Questions 1. LOCATION: "Where does it hurt?"      Headache 2. ONSET: "When did the sinus pain start?"  (e.g., hours, days)      2 weeks 3. SEVERITY: "How bad is the pain?"   (Scale 1-10; mild, moderate or severe)   - MILD (1-3): doesn't interfere with normal activities    - MODERATE (4-7): interferes with normal activities (e.g., work or school) or awakens from sleep   - SEVERE (8-10): excruciating pain and patient unable to do any normal activities        mild 4. RECURRENT SYMPTOM: "Have you ever had sinus problems before?" If Yes, ask: "When was the last time?" and "What happened that time?"      no 5. NASAL CONGESTION: "Is the nose blocked?" If Yes, ask: "Can you open it or must you breathe through your mouth?"     no 6. NASAL DISCHARGE: "Do you have discharge from your nose?" If so ask, "What color?"     clear 7. FEVER: "Do you have a fever?" If Yes, ask: "What is it, how was it measured, and when did it start?"      No 8. OTHER SYMPTOMS:  "Do you have any other symptoms?" (e.g., sore throat, cough, earache, difficulty breathing)     Cough 9. PREGNANCY: "Is there any chance you are pregnant?" "When was your last menstrual period?"     no  Protocols used: Sinus Pain or Congestion-A-AH

## 2023-11-14 DIAGNOSIS — Z992 Dependence on renal dialysis: Secondary | ICD-10-CM | POA: Diagnosis not present

## 2023-11-14 DIAGNOSIS — N2581 Secondary hyperparathyroidism of renal origin: Secondary | ICD-10-CM | POA: Diagnosis not present

## 2023-11-14 DIAGNOSIS — N186 End stage renal disease: Secondary | ICD-10-CM | POA: Diagnosis not present

## 2023-11-17 DIAGNOSIS — Z992 Dependence on renal dialysis: Secondary | ICD-10-CM | POA: Diagnosis not present

## 2023-11-17 DIAGNOSIS — N186 End stage renal disease: Secondary | ICD-10-CM | POA: Diagnosis not present

## 2023-11-17 DIAGNOSIS — N2581 Secondary hyperparathyroidism of renal origin: Secondary | ICD-10-CM | POA: Diagnosis not present

## 2023-11-19 DIAGNOSIS — N2581 Secondary hyperparathyroidism of renal origin: Secondary | ICD-10-CM | POA: Diagnosis not present

## 2023-11-19 DIAGNOSIS — N186 End stage renal disease: Secondary | ICD-10-CM | POA: Diagnosis not present

## 2023-11-19 DIAGNOSIS — Z992 Dependence on renal dialysis: Secondary | ICD-10-CM | POA: Diagnosis not present

## 2023-11-20 ENCOUNTER — Ambulatory Visit (INDEPENDENT_AMBULATORY_CARE_PROVIDER_SITE_OTHER): Admitting: Physician Assistant

## 2023-11-20 ENCOUNTER — Encounter: Payer: Self-pay | Admitting: Physician Assistant

## 2023-11-20 VITALS — BP 168/75 | HR 102 | Ht 67.0 in | Wt 123.2 lb

## 2023-11-20 DIAGNOSIS — J301 Allergic rhinitis due to pollen: Secondary | ICD-10-CM | POA: Diagnosis not present

## 2023-11-20 DIAGNOSIS — E1142 Type 2 diabetes mellitus with diabetic polyneuropathy: Secondary | ICD-10-CM | POA: Diagnosis not present

## 2023-11-20 DIAGNOSIS — C3411 Malignant neoplasm of upper lobe, right bronchus or lung: Secondary | ICD-10-CM | POA: Diagnosis not present

## 2023-11-20 DIAGNOSIS — I10 Essential (primary) hypertension: Secondary | ICD-10-CM

## 2023-11-20 DIAGNOSIS — N186 End stage renal disease: Secondary | ICD-10-CM | POA: Diagnosis not present

## 2023-11-20 LAB — HEMOGLOBIN A1C: Hgb A1c MFr Bld: 7 % — ABNORMAL HIGH (ref 4.6–6.5)

## 2023-11-20 MED ORDER — FLUTICASONE PROPIONATE 50 MCG/ACT NA SUSP: 2.0000 | Freq: Every day | NASAL | 1 refills | Status: DC

## 2023-11-20 MED ORDER — AMLODIPINE-OLMESARTAN 10-20 MG PO TABS: 1.0000 | ORAL_TABLET | Freq: Every day | ORAL | 1 refills | Status: AC

## 2023-11-20 MED ORDER — HYDRALAZINE HCL 100 MG PO TABS: 100.0000 mg | ORAL_TABLET | Freq: Two times a day (BID) | ORAL | 1 refills | Status: AC

## 2023-11-20 NOTE — Assessment & Plan Note (Signed)
 Unmedicated, she does not monitor her sugar.  Will check A1c today. Pending A1c will monitor or refer to endocrinology

## 2023-11-20 NOTE — Progress Notes (Signed)
 New patient visit   Patient: Amber Cross   DOB: 08/02/1960   63 y.o. Female  MRN: [id number] Visit Date: 11/20/2023  Today's healthcare provider: Trenton Frock, PA-C   Cc. New pt, establish care   Subjective    Amber Cross is a 63 y.o. female who presents today as a new patient to establish care. 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 SCLC in treatment, other pmh of HFrEF, HTN, DM2, chronic respiratory failure.  Discussed the use of AI scribe software for clinical note transcription with the patient, who gave verbal consent to proceed.  History of Present Illness   The patient reports not currently managing her diabetes with insulin  or oral medications, stating she stopped insulin  several months ago when her condition was improving. The patient does not monitor her blood sugar levels regularly. Denies history of seeing endocrinology.  The patient also reports new symptoms suggestive of allergies, including coughing, congestion, and postnasal drip. The patient has not previously had allergies and is not currently on any allergy medications.  The patient's blood pressure is high despite being on medication. The patient reports taking her blood pressure medication around the same time each day, but not immediately upon waking.  The patient's gabapentin  dosage is also discussed. The patient reports taking 300mg  daily, despite having a prescription for 600mg , which she cut in half. The patient reports taking gabapentin  every day.       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   Postop check 08/05/2022   Pseudogout    Stroke (HCC) 10/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   Tamponade 06/18/2022   Tobacco abuse    Past Surgical History:  Procedure Laterality Date   ANTERIOR CRUCIATE LIGAMENT REPAIR Right    AV FISTULA PLACEMENT Left 07/01/2022   Procedure: LEFT ARTERIOVENOUS (AV) FISTULA CREATION;  Surgeon: Dannis Dy, MD;  Location: [facility] OR;  Service: Vascular;  Laterality: Left;  with regional block   BRONCHIAL BIOPSY  12/18/2021   Procedure: BRONCHIAL BIOPSIES;  Surgeon: Prudy Brownie, DO;  Location: [facility];  Service: Pulmonary;;   BRONCHIAL BRUSHINGS  12/18/2021   Procedure: BRONCHIAL BRUSHINGS;  Surgeon: Prudy Brownie, DO;  Location: [facility];  Service: Pulmonary;;   BRONCHIAL NEEDLE ASPIRATION BIOPSY  12/18/2021   Procedure: BRONCHIAL NEEDLE ASPIRATION BIOPSIES;  Surgeon: Prudy Brownie, DO;  Location: [facility];  Service: Pulmonary;;   CATARACT EXTRACTION     CHEST TUBE INSERTION Right 06/18/2022   Procedure: CHEST TUBE INSERTION;  Surgeon: Shon Downing, MD;  Location: [facility] OR;  Service: Thoracic;  Laterality: Right;   EXCHANGE OF A DIALYSIS CATHETER N/A 08/08/2022   Procedure: EXCHANGE OF A TUNNELED DIALYSIS CATHETER;  Surgeon: Margherita Shell, MD;  Location: [facility] OR;  Service: Vascular;  Laterality: N/A;   FIDUCIAL MARKER PLACEMENT  12/18/2021   Procedure: FIDUCIAL MARKER PLACEMENT;  Surgeon: Prudy Brownie,  DO;  Location: [facility];  Service: Pulmonary;;   IR FLUORO GUIDE CV LINE RIGHT  06/26/2022   IR US  GUIDE VASC ACCESS RIGHT  06/26/2022   OVARY SURGERY     RIGHT OOPHORECTOMY Right    SMALL INTESTINE SURGERY     SUBXYPHOID PERICARDIAL WINDOW N/A 06/18/2022   Procedure: SUBXYPHOID PERICARDIAL WINDOW;  Surgeon: Shon Downing, MD;  Location: [facility] OR;  Service: Thoracic;  Laterality: N/A;   TEE WITHOUT CARDIOVERSION N/A  06/18/2022   Procedure: TRANSESOPHAGEAL ECHOCARDIOGRAM (TEE);  Surgeon: Shon Downing, MD;  Location: [facility] OR;  Service: Thoracic;  Laterality: N/A;   TUBAL LIGATION     VIDEO BRONCHOSCOPY WITH RADIAL ENDOBRONCHIAL ULTRASOUND  12/18/2021   Procedure: RADIAL ENDOBRONCHIAL ULTRASOUND;  Surgeon: Prudy Brownie, DO;  Location: [facility];  Service: Pulmonary;;   Family Status  Relation Name Status   Mother  (Not Specified)   Father  (Not Specified)  No partnership data on file   Family History  Problem Relation Age of Onset   Diabetes Mother    Hypertension Mother    Hypertension Father    Social History   Socioeconomic History   Marital status: Married    Spouse name: Not on file   Number of children: Not on file   Years of education: Not on file   Highest education level: Not on file  Occupational History   Not on file  Tobacco Use   Smoking status: Every Day    Current packs/day: 0.50    Average packs/day: 0.5 packs/day for 40.0 years (20.0 ttl pk-yrs)    Types: Cigarettes   Smokeless tobacco: Never   Tobacco comments:    Cutting back 1/4-1/2 ppd  Vaping Use   Vaping status: Never Used  Substance and Sexual Activity   Alcohol use: Not Currently   Drug use: Yes    Frequency: 4.0 times per week    Types: Marijuana    Comment: smokes every other day   Sexual activity: Not Currently    Birth control/protection: Abstinence  Other Topics Concern   Not on file  Social History Narrative   Not on file   Social Drivers of Health   Financial Resource Strain: High Risk (03/11/2023)   Overall Financial Resource Strain (CARDIA)    Difficulty of Paying Living Expenses: Hard  Food Insecurity: Food Insecurity Present (03/11/2023)   Hunger Vital Sign    Worried About Running Out of Food in the Last Year: Sometimes true    Ran Out of Food in the Last Year: Sometimes true  Transportation Needs: No Transportation Needs (03/11/2023)   PRAPARE - Scientist, research (physical sciences) (Medical): No    Lack of Transportation (Non-Medical): No  Physical Activity: Inactive (03/11/2023)   Exercise Vital Sign    Days of Exercise per Week: 0 days    Minutes of Exercise per Session: 0 min  Stress: Stress Concern Present (03/11/2023)   [employer] of Occupational Health - Occupational Stress Questionnaire    Feeling of Stress : To some extent  Social Connections: Socially Isolated (03/11/2023)   Social Connection and Isolation Panel [NHANES]    Frequency of Communication with Friends and Family: Never    Frequency of Social Gatherings with Friends and Family: Never    Attends Religious Services: Never    Database administrator or Organizations: No    Attends Banker Meetings: Never    Marital Status: Never married   Outpatient Medications Prior to  Visit  Medication Sig   gabapentin  (NEURONTIN ) 600 MG tablet Take 600 mg by mouth daily.   atorvastatin  (LIPITOR ) 80 MG tablet Take 1 tablet (80 mg total) by mouth daily.   Calcium  Acetate 668 (169 Ca) MG TABS Take 1 tablet by mouth 3 (three) times daily.   dronabinol  (MARINOL ) 2.5 MG capsule Take 1 capsule (2.5 mg total) by mouth 2 (two) times daily before a meal.   furosemide  (LASIX ) 40 MG tablet Take 1 tablet (40 mg total) by mouth daily as needed (swelling in legs or weight gain).   Lancet Devices (ADJUSTABLE LANCING DEVICE) MISC    megestrol  (MEGACE  ES) 625 MG/5ML suspension Take 5 mLs (625 mg total) by mouth daily.   pantoprazole  (PROTONIX ) 40 MG tablet Take 40 mg by mouth daily.   prednisoLONE acetate (PRED FORTE) 1 % ophthalmic suspension Place 1 drop into the left eye 4 (four) times daily.   sevelamer  carbonate (RENVELA ) 800 MG tablet Take 800 mg by mouth 3 (three) times daily with meals.   traZODone  (DESYREL ) 50 MG tablet Take 1 tablet (50 mg total) by mouth at bedtime.   [DISCONTINUED] albuterol  (VENTOLIN  HFA) 108 (90 Base) MCG/ACT inhaler Inhale 2 puffs into the lungs every 6 (six) hours  as needed for wheezing or shortness of breath. (Patient not taking: Reported on 10/21/2023)   [DISCONTINUED] amLODipine  (NORVASC ) 10 MG tablet Take 1 tablet (10 mg total) by mouth daily.   [DISCONTINUED] amlodipine -olmesartan  (AZOR ) 10-20 MG tablet Take 1 tablet by mouth at bedtime.   [DISCONTINUED] Blood Glucose Calibration (MYGLUCOHEALTH CONTROL) SOLN  (Patient not taking: Reported on 10/21/2023)   [DISCONTINUED] Continuous Glucose Sensor (DEXCOM G7 SENSOR) MISC Apply each device on your skin every 10 days (Patient not taking: Reported on 10/21/2023)   [DISCONTINUED] glucose blood (PRECISION QID TEST) test strip  (Patient not taking: Reported on 10/21/2023)   [DISCONTINUED] hydrALAZINE  (APRESOLINE ) 50 MG tablet Take 1 tablet (50 mg total) by mouth every 8 (eight) hours.   [DISCONTINUED] losartan  (COZAAR ) 25 MG tablet Take 1 tablet (25 mg total) by mouth daily.   [DISCONTINUED] ondansetron  (ZOFRAN ) 8 MG tablet Take 1 tablet (8 mg total) by mouth every 8 (eight) hours as needed for nausea, vomiting or refractory nausea / vomiting. Start on the third day after carboplatin . (Patient not taking: Reported on 09/23/2023)   [DISCONTINUED] prochlorperazine  (COMPAZINE ) 10 MG tablet Take 1 tablet (10 mg total) by mouth every 6 (six) hours as needed for nausea or vomiting. (Patient not taking: Reported on 09/23/2023)   No facility-administered medications prior to visit.   Allergies  Allergen Reactions   Penicillins Itching and Other (See Comments)    redness   Strawberry (Diagnostic) Itching and Swelling   Tomato Itching and Swelling    Immunization History  Administered Date(s) Administered   Influenza, Seasonal, Injecte, Preservative Fre 04/08/2023   Influenza,inj,Quad PF,6+ Mos 07/03/2017, 04/16/2021   Influenza,inj,quad, With Preservative 07/03/2017, 04/16/2021   Influenza-Unspecified 07/03/2017   PFIZER(Purple Top)SARS-COV-2 Vaccination 10/23/2019, 11/13/2019   Pneumococcal Polysaccharide-23  08/27/2017   Tdap 05/29/2021    Health Maintenance  Topic Date Due   Zoster Vaccines- Shingrix (1 of 2) Never done   Cervical Cancer Screening (HPV/Pap Cotest)  Never done   Colonoscopy  Never done   MAMMOGRAM  Never done   Pneumococcal Vaccine 8-3 Years old (2 of 2 - PCV) 08/27/2018   COVID-19 Vaccine (3 - Pfizer risk series) 12/11/2019   FOOT EXAM  12/05/2023   INFLUENZA VACCINE  02/27/2024  Medicare Annual Wellness (AWV)  03/10/2024   HEMOGLOBIN A1C  05/21/2024   OPHTHALMOLOGY EXAM  10/20/2024   DTaP/Tdap/Td (2 - Td or Tdap) 05/30/2031   Hepatitis C Screening  Completed   HIV Screening  Completed   HPV VACCINES  Aged Out   Meningococcal B Vaccine  Aged Out    Patient Care Team: Trenton Frock, PA-C as PCP - General (Physician Assistant) Tobb, Kardie, DO as PCP - Cardiology (Cardiology) [place] [place]  Review of Systems  Constitutional:  Negative for fatigue and fever.  Respiratory:  Negative for cough and shortness of breath.   Cardiovascular:  Negative for chest pain and leg swelling.  Gastrointestinal:  Negative for abdominal pain.  Neurological:  Negative for dizziness and headaches.        Objective    BP (!) 168/75   Pulse (!) 102   Ht 5\' 7"  (1.702 m)   Wt 123 lb 3.2 oz (55.9 kg)   LMP 09/29/2011   SpO2 99%   BMI 19.30 kg/m     Physical Exam Constitutional:      General: She is awake.     Appearance: She is well-developed.  HENT:     Head: Normocephalic.  Eyes:     Conjunctiva/sclera: Conjunctivae normal.  Cardiovascular:     Rate and Rhythm: Normal rate and regular rhythm.     Heart sounds: Normal heart sounds.  Pulmonary:     Effort: Pulmonary effort is normal.     Breath sounds: Normal breath sounds.  Skin:    General: Skin is warm.  Neurological:     Mental Status: She is alert and oriented to person, place, and time.  Psychiatric:        Attention and Perception: Attention normal.        Mood and Affect: Mood  normal.        Speech: Speech normal.        Behavior: Behavior is cooperative.     Depression Screen    11/20/2023   11:05 AM 04/08/2023   12:27 PM 03/11/2023   12:07 PM 03/11/2023   11:01 AM  PHQ 2/9 Scores  PHQ - 2 Score 4 2 0 0  PHQ- 9 Score 8 4  1    Results for orders placed or performed in visit on 11/20/23  HgB A1c  Result Value Ref Range   Hgb A1c MFr Bld 7.0 (H) 4.6 - 6.5 %    Assessment & Plan     ESRD (end stage renal disease) [facility]) Assessment & Plan: Dialysis M WF .  Pt is still hypertensive.  Taking gabapentin  300 mg -- but various different rx seen   Small cell lung cancer, right upper lobe [facility]) Assessment & Plan: Following with oncology, under chemotherapy.   Essential hypertension Assessment & Plan: Elevated in office, manages with, as far as I can tell from note review, Amlodipine  10 - olmesartan  20 , hydralazine  1000 mg bid Will get nephrology's notes to confirm pt's current med list, given I also see losartan  25 and carvediolol 12.5 bid.    Orders: -     amLODIPine -Olmesartan ; Take 1 tablet by mouth at bedtime.  Dispense: 90 tablet; Refill: 1 -     hydrALAZINE  HCl; Take 1 tablet (100 mg total) by mouth 2 (two) times daily.  Dispense: 180 tablet; Refill: 1  Type 2 diabetes mellitus with diabetic polyneuropathy, without long-term current use of insulin  (HCC) Assessment & Plan: Unmedicated, she does not monitor her  sugar.  Will check A1c today. Pending A1c will monitor or refer to endocrinology  Orders: -     Hemoglobin A1c  Seasonal allergic rhinitis due to pollen Assessment & Plan: Rx flonase   Orders: -     Fluticasone  Propionate; Place 2 sprays into both nostrils daily.  Dispense: 16 g; Refill: 1      Trenton Frock, PA-C  [facility] [phone] (phone) [phone] (fax)  [facility]

## 2023-11-20 NOTE — Assessment & Plan Note (Signed)
 Rx flonase

## 2023-11-20 NOTE — Assessment & Plan Note (Signed)
 Elevated in office, manages with, as far as I can tell from note review, Amlodipine  10 - olmesartan  20 , hydralazine  1000 mg bid Will get nephrology's notes to confirm pt's current med list, given I also see losartan  25 and carvediolol 12.5 bid.

## 2023-11-20 NOTE — Assessment & Plan Note (Addendum)
 Dialysis M WF .  Pt is still hypertensive.  Taking gabapentin  300 mg daily-- but various different rx seen, and not recommended daily per uptodate, will confirm w/ nephrology

## 2023-11-20 NOTE — Assessment & Plan Note (Signed)
 Following with oncology, under chemotherapy.

## 2023-11-21 ENCOUNTER — Encounter: Payer: Self-pay | Admitting: Physician Assistant

## 2023-11-21 DIAGNOSIS — N186 End stage renal disease: Secondary | ICD-10-CM | POA: Diagnosis not present

## 2023-11-21 DIAGNOSIS — Z992 Dependence on renal dialysis: Secondary | ICD-10-CM | POA: Diagnosis not present

## 2023-11-21 DIAGNOSIS — N2581 Secondary hyperparathyroidism of renal origin: Secondary | ICD-10-CM | POA: Diagnosis not present

## 2023-11-24 DIAGNOSIS — N186 End stage renal disease: Secondary | ICD-10-CM | POA: Diagnosis not present

## 2023-11-24 DIAGNOSIS — N2581 Secondary hyperparathyroidism of renal origin: Secondary | ICD-10-CM | POA: Diagnosis not present

## 2023-11-24 DIAGNOSIS — Z992 Dependence on renal dialysis: Secondary | ICD-10-CM | POA: Diagnosis not present

## 2023-11-25 ENCOUNTER — Inpatient Hospital Stay: Admitting: Family

## 2023-11-25 ENCOUNTER — Inpatient Hospital Stay

## 2023-11-25 ENCOUNTER — Ambulatory Visit (INDEPENDENT_AMBULATORY_CARE_PROVIDER_SITE_OTHER): Admitting: Ophthalmology

## 2023-11-25 ENCOUNTER — Encounter (INDEPENDENT_AMBULATORY_CARE_PROVIDER_SITE_OTHER): Payer: Self-pay | Admitting: Ophthalmology

## 2023-11-25 VITALS — BP 177/63 | HR 87 | Resp 18

## 2023-11-25 DIAGNOSIS — Z5112 Encounter for antineoplastic immunotherapy: Secondary | ICD-10-CM | POA: Diagnosis not present

## 2023-11-25 DIAGNOSIS — J90 Pleural effusion, not elsewhere classified: Secondary | ICD-10-CM | POA: Diagnosis not present

## 2023-11-25 DIAGNOSIS — H35033 Hypertensive retinopathy, bilateral: Secondary | ICD-10-CM | POA: Diagnosis not present

## 2023-11-25 DIAGNOSIS — C3491 Malignant neoplasm of unspecified part of right bronchus or lung: Secondary | ICD-10-CM | POA: Diagnosis not present

## 2023-11-25 DIAGNOSIS — Z992 Dependence on renal dialysis: Secondary | ICD-10-CM | POA: Diagnosis not present

## 2023-11-25 DIAGNOSIS — N186 End stage renal disease: Secondary | ICD-10-CM | POA: Diagnosis not present

## 2023-11-25 DIAGNOSIS — Z794 Long term (current) use of insulin: Secondary | ICD-10-CM

## 2023-11-25 DIAGNOSIS — Z961 Presence of intraocular lens: Secondary | ICD-10-CM

## 2023-11-25 DIAGNOSIS — N184 Chronic kidney disease, stage 4 (severe): Secondary | ICD-10-CM

## 2023-11-25 DIAGNOSIS — C3411 Malignant neoplasm of upper lobe, right bronchus or lung: Secondary | ICD-10-CM

## 2023-11-25 DIAGNOSIS — D631 Anemia in chronic kidney disease: Secondary | ICD-10-CM | POA: Diagnosis not present

## 2023-11-25 DIAGNOSIS — I10 Essential (primary) hypertension: Secondary | ICD-10-CM | POA: Diagnosis not present

## 2023-11-25 DIAGNOSIS — E113413 Type 2 diabetes mellitus with severe nonproliferative diabetic retinopathy with macular edema, bilateral: Secondary | ICD-10-CM

## 2023-11-25 DIAGNOSIS — I3139 Other pericardial effusion (noninflammatory): Secondary | ICD-10-CM | POA: Diagnosis not present

## 2023-11-25 DIAGNOSIS — Z923 Personal history of irradiation: Secondary | ICD-10-CM | POA: Diagnosis not present

## 2023-11-25 LAB — CBC WITH DIFFERENTIAL (CANCER CENTER ONLY)
Abs Immature Granulocytes: 0.01 10*3/uL (ref 0.00–0.07)
Basophils Absolute: 0 10*3/uL (ref 0.0–0.1)
Basophils Relative: 1 %
Eosinophils Absolute: 0.1 10*3/uL (ref 0.0–0.5)
Eosinophils Relative: 2 %
HCT: 34.4 % — ABNORMAL LOW (ref 36.0–46.0)
Hemoglobin: 10.9 g/dL — ABNORMAL LOW (ref 12.0–15.0)
Immature Granulocytes: 0 %
Lymphocytes Relative: 22 %
Lymphs Abs: 0.9 10*3/uL (ref 0.7–4.0)
MCH: 30.1 pg (ref 26.0–34.0)
MCHC: 31.7 g/dL (ref 30.0–36.0)
MCV: 95 fL (ref 80.0–100.0)
Monocytes Absolute: 0.3 10*3/uL (ref 0.1–1.0)
Monocytes Relative: 8 %
Neutro Abs: 2.9 10*3/uL (ref 1.7–7.7)
Neutrophils Relative %: 67 %
Platelet Count: 147 10*3/uL — ABNORMAL LOW (ref 150–400)
RBC: 3.62 MIL/uL — ABNORMAL LOW (ref 3.87–5.11)
RDW: 15.9 % — ABNORMAL HIGH (ref 11.5–15.5)
WBC Count: 4.3 10*3/uL (ref 4.0–10.5)
nRBC: 0 % (ref 0.0–0.2)

## 2023-11-25 LAB — CMP (CANCER CENTER ONLY)
ALT: 13 U/L (ref 0–44)
AST: 13 U/L — ABNORMAL LOW (ref 15–41)
Albumin: 3 g/dL — ABNORMAL LOW (ref 3.5–5.0)
Alkaline Phosphatase: 76 U/L (ref 38–126)
Anion gap: 14 (ref 5–15)
BUN: 32 mg/dL — ABNORMAL HIGH (ref 8–23)
CO2: 24 mmol/L (ref 22–32)
Calcium: 8.6 mg/dL — ABNORMAL LOW (ref 8.9–10.3)
Chloride: 101 mmol/L (ref 98–111)
Creatinine: 5.21 mg/dL — ABNORMAL HIGH (ref 0.44–1.00)
GFR, Estimated: 9 mL/min — ABNORMAL LOW (ref >60)
Glucose, Bld: 131 mg/dL — ABNORMAL HIGH (ref 70–99)
Potassium: 4.9 mmol/L (ref 3.5–5.1)
Sodium: 139 mmol/L (ref 135–145)
Total Bilirubin: 0.2 mg/dL (ref 0.0–1.2)
Total Protein: 6.9 g/dL (ref 6.5–8.1)

## 2023-11-25 LAB — IRON AND IRON BINDING CAPACITY (CC-WL,HP ONLY)
Iron: 119 ug/dL (ref 28–170)
Saturation Ratios: 42 % — ABNORMAL HIGH (ref 10.4–31.8)
TIBC: 281 ug/dL (ref 250–450)
UIBC: 162 ug/dL (ref 148–442)

## 2023-11-25 LAB — SAMPLE TO BLOOD BANK

## 2023-11-25 LAB — LACTATE DEHYDROGENASE: LDH: 199 U/L — ABNORMAL HIGH (ref 98–192)

## 2023-11-25 LAB — FERRITIN: Ferritin: 1212 ng/mL — ABNORMAL HIGH (ref 11–307)

## 2023-11-25 MED ORDER — HEPARIN SOD (PORK) LOCK FLUSH 100 UNIT/ML IV SOLN
500.0000 [IU] | Freq: Once | INTRAVENOUS | Status: DC | PRN
Start: 2023-11-25 — End: 2023-11-25

## 2023-11-25 MED ORDER — SODIUM CHLORIDE 0.9 % IV SOLN
1200.0000 mg | Freq: Once | INTRAVENOUS | Status: AC
  Administered 2023-11-25: 1200 mg via INTRAVENOUS
  Filled 2023-11-25: qty 20

## 2023-11-25 MED ORDER — DARBEPOETIN ALFA 300 MCG/0.6ML IJ SOSY: 300.0000 ug | PREFILLED_SYRINGE | Freq: Once | INTRAMUSCULAR | Status: DC

## 2023-11-25 MED ORDER — SODIUM CHLORIDE 0.9% FLUSH: 10.0000 mL | INTRAVENOUS | Status: DC | PRN

## 2023-11-25 MED ORDER — BEVACIZUMAB CHEMO INJECTION 1.25MG/0.05ML SYRINGE FOR KALEIDOSCOPE
1.2500 mg | INTRAVITREAL | Status: AC | PRN
  Administered 2023-11-25: 1.25 mg via INTRAVITREAL

## 2023-11-25 MED ORDER — AFLIBERCEPT 2MG/0.05ML IZ SOLN FOR KALEIDOSCOPE
2.0000 mg | INTRAVITREAL | Status: AC | PRN
  Administered 2023-11-25: 2 mg via INTRAVITREAL

## 2023-11-25 MED ORDER — SODIUM CHLORIDE 0.9 % IV SOLN: Freq: Once | INTRAVENOUS | Status: AC

## 2023-11-25 NOTE — Progress Notes (Signed)
 Ok to treat today with Creatinine of 5.21 per Dr Maria Shiner.

## 2023-11-25 NOTE — Patient Instructions (Signed)
 [facility] - A DEPT OF [facility]  Discharge Instructions: Thank you for choosing [facility] to provide your oncology and hematology care.   If you have a lab appointment with the Cancer Center, please go directly to the Cancer Center and check in at the registration area.  Wear comfortable clothing and clothing appropriate for easy access to any Portacath or PICC line.   We strive to give you quality time with your provider. You may need to reschedule your appointment if you arrive late (15 or more minutes).  Arriving late affects you and other patients whose appointments are after yours.  Also, if you miss three or more appointments without notifying the office, you may be dismissed from the clinic at the provider's discretion.      For prescription refill requests, have your pharmacy contact our office and allow 72 hours for refills to be completed.    Today you received the following chemotherapy and/or immunotherapy agents Tecentriq      To help prevent nausea and vomiting after your treatment, we encourage you to take your nausea medication as directed.  BELOW ARE SYMPTOMS THAT SHOULD BE REPORTED IMMEDIATELY: *FEVER GREATER THAN 100.4 F (38 C) OR HIGHER *CHILLS OR SWEATING *NAUSEA AND VOMITING THAT IS NOT CONTROLLED WITH YOUR NAUSEA MEDICATION *UNUSUAL SHORTNESS OF BREATH *UNUSUAL BRUISING OR BLEEDING *URINARY PROBLEMS (pain or burning when urinating, or frequent urination) *BOWEL PROBLEMS (unusual diarrhea, constipation, pain near the anus) TENDERNESS IN MOUTH AND THROAT WITH OR WITHOUT PRESENCE OF ULCERS (sore throat, sores in mouth, or a toothache) UNUSUAL RASH, SWELLING OR PAIN  UNUSUAL VAGINAL DISCHARGE OR ITCHING   Items with * indicate a potential emergency and should be followed up as soon as possible or go to the Emergency Department if any problems should occur.  Please show the CHEMOTHERAPY ALERT CARD or IMMUNOTHERAPY  ALERT CARD at check-in to the Emergency Department and triage nurse. Should you have questions after your visit or need to cancel or reschedule your appointment, please contact [facility] - A DEPT OF [facility]  [phone] and follow the prompts.  Office hours are 8:00 a.m. to 4:30 p.m. Monday - Friday. Please note that voicemails left after 4:00 p.m. may not be returned until the following business day.  We are closed weekends and major holidays. You have access to a nurse at all times for urgent questions. Please call the main number to the clinic [phone] and follow the prompts.  For any non-urgent questions, you may also contact your provider using MyChart. We now offer e-Visits for anyone 74 and older to request care online for non-urgent symptoms. For details visit [email or web address].   Also download the MyChart app! Go to the app store, search "MyChart", open the app, select [facility], and log in with your MyChart username and password.

## 2023-11-26 DIAGNOSIS — N2581 Secondary hyperparathyroidism of renal origin: Secondary | ICD-10-CM | POA: Diagnosis not present

## 2023-11-26 DIAGNOSIS — N186 End stage renal disease: Secondary | ICD-10-CM | POA: Diagnosis not present

## 2023-11-26 DIAGNOSIS — Z992 Dependence on renal dialysis: Secondary | ICD-10-CM | POA: Diagnosis not present

## 2023-11-28 DIAGNOSIS — N186 End stage renal disease: Secondary | ICD-10-CM | POA: Diagnosis not present

## 2023-11-28 DIAGNOSIS — E1122 Type 2 diabetes mellitus with diabetic chronic kidney disease: Secondary | ICD-10-CM | POA: Diagnosis not present

## 2023-11-28 DIAGNOSIS — Z992 Dependence on renal dialysis: Secondary | ICD-10-CM | POA: Diagnosis not present

## 2023-11-28 DIAGNOSIS — N2581 Secondary hyperparathyroidism of renal origin: Secondary | ICD-10-CM | POA: Diagnosis not present

## 2023-12-01 DIAGNOSIS — N186 End stage renal disease: Secondary | ICD-10-CM | POA: Diagnosis not present

## 2023-12-01 DIAGNOSIS — E1122 Type 2 diabetes mellitus with diabetic chronic kidney disease: Secondary | ICD-10-CM | POA: Diagnosis not present

## 2023-12-01 DIAGNOSIS — N2581 Secondary hyperparathyroidism of renal origin: Secondary | ICD-10-CM | POA: Diagnosis not present

## 2023-12-01 DIAGNOSIS — Z992 Dependence on renal dialysis: Secondary | ICD-10-CM | POA: Diagnosis not present

## 2023-12-03 ENCOUNTER — Other Ambulatory Visit: Payer: Self-pay

## 2023-12-03 DIAGNOSIS — N186 End stage renal disease: Secondary | ICD-10-CM | POA: Diagnosis not present

## 2023-12-03 DIAGNOSIS — E1122 Type 2 diabetes mellitus with diabetic chronic kidney disease: Secondary | ICD-10-CM | POA: Diagnosis not present

## 2023-12-03 DIAGNOSIS — Z992 Dependence on renal dialysis: Secondary | ICD-10-CM | POA: Diagnosis not present

## 2023-12-03 DIAGNOSIS — N2581 Secondary hyperparathyroidism of renal origin: Secondary | ICD-10-CM | POA: Diagnosis not present

## 2023-12-05 DIAGNOSIS — N2581 Secondary hyperparathyroidism of renal origin: Secondary | ICD-10-CM | POA: Diagnosis not present

## 2023-12-05 DIAGNOSIS — E1122 Type 2 diabetes mellitus with diabetic chronic kidney disease: Secondary | ICD-10-CM | POA: Diagnosis not present

## 2023-12-05 DIAGNOSIS — Z992 Dependence on renal dialysis: Secondary | ICD-10-CM | POA: Diagnosis not present

## 2023-12-05 DIAGNOSIS — N186 End stage renal disease: Secondary | ICD-10-CM | POA: Diagnosis not present

## 2023-12-08 DIAGNOSIS — E1122 Type 2 diabetes mellitus with diabetic chronic kidney disease: Secondary | ICD-10-CM | POA: Diagnosis not present

## 2023-12-08 DIAGNOSIS — N2581 Secondary hyperparathyroidism of renal origin: Secondary | ICD-10-CM | POA: Diagnosis not present

## 2023-12-08 DIAGNOSIS — N186 End stage renal disease: Secondary | ICD-10-CM | POA: Diagnosis not present

## 2023-12-08 DIAGNOSIS — Z992 Dependence on renal dialysis: Secondary | ICD-10-CM | POA: Diagnosis not present

## 2023-12-10 DIAGNOSIS — Z992 Dependence on renal dialysis: Secondary | ICD-10-CM | POA: Diagnosis not present

## 2023-12-10 DIAGNOSIS — N2581 Secondary hyperparathyroidism of renal origin: Secondary | ICD-10-CM | POA: Diagnosis not present

## 2023-12-10 DIAGNOSIS — N186 End stage renal disease: Secondary | ICD-10-CM | POA: Diagnosis not present

## 2023-12-10 DIAGNOSIS — E1122 Type 2 diabetes mellitus with diabetic chronic kidney disease: Secondary | ICD-10-CM | POA: Diagnosis not present

## 2023-12-12 ENCOUNTER — Other Ambulatory Visit: Payer: Self-pay | Admitting: Physician Assistant

## 2023-12-12 DIAGNOSIS — J301 Allergic rhinitis due to pollen: Secondary | ICD-10-CM

## 2023-12-12 DIAGNOSIS — N2581 Secondary hyperparathyroidism of renal origin: Secondary | ICD-10-CM | POA: Diagnosis not present

## 2023-12-12 DIAGNOSIS — E1122 Type 2 diabetes mellitus with diabetic chronic kidney disease: Secondary | ICD-10-CM | POA: Diagnosis not present

## 2023-12-12 DIAGNOSIS — Z992 Dependence on renal dialysis: Secondary | ICD-10-CM | POA: Diagnosis not present

## 2023-12-12 DIAGNOSIS — N186 End stage renal disease: Secondary | ICD-10-CM | POA: Diagnosis not present

## 2023-12-14 DIAGNOSIS — R770 Abnormality of albumin: Secondary | ICD-10-CM | POA: Diagnosis not present

## 2023-12-14 DIAGNOSIS — N186 End stage renal disease: Secondary | ICD-10-CM | POA: Diagnosis not present

## 2023-12-15 DIAGNOSIS — N186 End stage renal disease: Secondary | ICD-10-CM | POA: Diagnosis not present

## 2023-12-15 DIAGNOSIS — Z992 Dependence on renal dialysis: Secondary | ICD-10-CM | POA: Diagnosis not present

## 2023-12-15 DIAGNOSIS — N2581 Secondary hyperparathyroidism of renal origin: Secondary | ICD-10-CM | POA: Diagnosis not present

## 2023-12-15 DIAGNOSIS — E1122 Type 2 diabetes mellitus with diabetic chronic kidney disease: Secondary | ICD-10-CM | POA: Diagnosis not present

## 2023-12-16 ENCOUNTER — Inpatient Hospital Stay

## 2023-12-16 ENCOUNTER — Inpatient Hospital Stay: Attending: Radiation Oncology

## 2023-12-16 ENCOUNTER — Other Ambulatory Visit: Payer: Self-pay

## 2023-12-16 ENCOUNTER — Inpatient Hospital Stay (HOSPITAL_BASED_OUTPATIENT_CLINIC_OR_DEPARTMENT_OTHER): Admitting: Family

## 2023-12-16 ENCOUNTER — Encounter: Payer: Self-pay | Admitting: Medical Oncology

## 2023-12-16 VITALS — BP 144/56 | HR 90 | Temp 98.1°F | Resp 19 | Ht 67.0 in | Wt 124.0 lb

## 2023-12-16 DIAGNOSIS — D631 Anemia in chronic kidney disease: Secondary | ICD-10-CM

## 2023-12-16 DIAGNOSIS — N184 Chronic kidney disease, stage 4 (severe): Secondary | ICD-10-CM

## 2023-12-16 DIAGNOSIS — C3411 Malignant neoplasm of upper lobe, right bronchus or lung: Secondary | ICD-10-CM | POA: Diagnosis not present

## 2023-12-16 DIAGNOSIS — C3491 Malignant neoplasm of unspecified part of right bronchus or lung: Secondary | ICD-10-CM | POA: Insufficient documentation

## 2023-12-16 DIAGNOSIS — N186 End stage renal disease: Secondary | ICD-10-CM

## 2023-12-16 DIAGNOSIS — Z5112 Encounter for antineoplastic immunotherapy: Secondary | ICD-10-CM | POA: Diagnosis not present

## 2023-12-16 DIAGNOSIS — Z923 Personal history of irradiation: Secondary | ICD-10-CM | POA: Insufficient documentation

## 2023-12-16 LAB — CMP (CANCER CENTER ONLY)
ALT: 18 U/L (ref 0–44)
AST: 19 U/L (ref 15–41)
Albumin: 4.2 g/dL (ref 3.5–5.0)
Alkaline Phosphatase: 92 U/L (ref 38–126)
Anion gap: 13 (ref 5–15)
BUN: 55 mg/dL — ABNORMAL HIGH (ref 8–23)
CO2: 27 mmol/L (ref 22–32)
Calcium: 8.4 mg/dL — ABNORMAL LOW (ref 8.9–10.3)
Chloride: 100 mmol/L (ref 98–111)
Creatinine: 5.43 mg/dL — ABNORMAL HIGH (ref 0.44–1.00)
GFR, Estimated: 8 mL/min — ABNORMAL LOW (ref >60)
Glucose, Bld: 160 mg/dL — ABNORMAL HIGH (ref 70–99)
Potassium: 5.5 mmol/L — ABNORMAL HIGH (ref 3.5–5.1)
Sodium: 140 mmol/L (ref 135–145)
Total Bilirubin: 0.2 mg/dL (ref 0.0–1.2)
Total Protein: 6.6 g/dL (ref 6.5–8.1)

## 2023-12-16 LAB — CBC WITH DIFFERENTIAL (CANCER CENTER ONLY)
Abs Immature Granulocytes: 0.02 10*3/uL (ref 0.00–0.07)
Basophils Absolute: 0 10*3/uL (ref 0.0–0.1)
Basophils Relative: 0 %
Eosinophils Absolute: 0.1 10*3/uL (ref 0.0–0.5)
Eosinophils Relative: 2 %
HCT: 28.9 % — ABNORMAL LOW (ref 36.0–46.0)
Hemoglobin: 9.1 g/dL — ABNORMAL LOW (ref 12.0–15.0)
Immature Granulocytes: 0 %
Lymphocytes Relative: 15 %
Lymphs Abs: 1.1 10*3/uL (ref 0.7–4.0)
MCH: 30.1 pg (ref 26.0–34.0)
MCHC: 31.5 g/dL (ref 30.0–36.0)
MCV: 95.7 fL (ref 80.0–100.0)
Monocytes Absolute: 0.5 10*3/uL (ref 0.1–1.0)
Monocytes Relative: 8 %
Neutro Abs: 5.4 10*3/uL (ref 1.7–7.7)
Neutrophils Relative %: 75 %
Platelet Count: 142 10*3/uL — ABNORMAL LOW (ref 150–400)
RBC: 3.02 MIL/uL — ABNORMAL LOW (ref 3.87–5.11)
RDW: 18 % — ABNORMAL HIGH (ref 11.5–15.5)
WBC Count: 7.2 10*3/uL (ref 4.0–10.5)
nRBC: 0 % (ref 0.0–0.2)

## 2023-12-16 MED ORDER — SODIUM CHLORIDE 0.9 % IV SOLN: Freq: Once | INTRAVENOUS | Status: AC

## 2023-12-16 MED ORDER — DARBEPOETIN ALFA 300 MCG/0.6ML IJ SOSY
300.0000 ug | PREFILLED_SYRINGE | Freq: Once | INTRAMUSCULAR | Status: AC
  Administered 2023-12-16: 300 ug via SUBCUTANEOUS
  Filled 2023-12-16: qty 0.6

## 2023-12-16 MED ORDER — SODIUM CHLORIDE 0.9 % IV SOLN
1200.0000 mg | Freq: Once | INTRAVENOUS | Status: AC
  Administered 2023-12-16: 1200 mg via INTRAVENOUS
  Filled 2023-12-16: qty 20

## 2023-12-16 NOTE — Progress Notes (Signed)
 Hematology and Oncology Follow Up Visit  Amber Cross [id number] January 27, 1961 63 y.o. 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 She is under some stress at home with family but overall seems to be doing fairly well.  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  She states that dialysis is going well.  She denies fever, chills, n/v, cough, rash, dizziness, SOB, chest pain, palpitations, abdominal pain or changes in bowel or bladder habits at this time.  No swelling noted in her extremities at this time.  No falls or syncope reported. She ambulates with her cane for added support.  Appetite comes and goes. She is on fluid restrictions of 32 oz a day which is hard for her. Weight is stable at 124 lbs.   ECOG Performance Status: 1 - Symptomatic but completely ambulatory  Medications:  Allergies as of 12/16/2023       Reactions   Penicillins Itching, Other (See Comments)   redness   Strawberry (diagnostic) Itching, Swelling   Tomato Itching, Swelling        Medication List        Accurate as of Dec 16, 2023  9:43 AM. If you have any questions, ask your nurse or doctor.          Adjustable Lancing Device Misc   amlodipine -olmesartan  10-20 MG tablet Commonly known as: AZOR  Take 1 tablet by mouth at bedtime.   atorvastatin  80 MG tablet Commonly known as: LIPITOR  Take 1 tablet (80 mg total) by mouth daily.   Calcium  Acetate 668 (169 Ca) MG Tabs Take 1 tablet by  mouth 3 (three) times daily.   dronabinol  2.5 MG capsule Commonly known as: MARINOL  Take 1 capsule (2.5 mg total) by mouth 2 (two) times daily before a meal.   fluticasone  50 MCG/ACT nasal spray Commonly known as: FLONASE  SPRAY 2 SPRAYS INTO EACH NOSTRIL EVERY DAY   furosemide  40 MG tablet Commonly known as: Lasix  Take 1 tablet (40 mg total) by mouth daily as needed (swelling in legs or weight gain).   gabapentin  600 MG tablet Commonly known as: NEURONTIN  Take 600 mg by mouth daily.   hydrALAZINE  100 MG tablet Commonly known as: APRESOLINE  Take 1 tablet (100 mg total) by mouth 2 (two) times daily.   megestrol  625 MG/5ML suspension Commonly known as: MEGACE  ES Take 5 mLs (625 mg total) by mouth daily.   pantoprazole  40 MG tablet Commonly known as: PROTONIX  Take 40 mg by mouth daily.   prednisoLONE acetate 1 % ophthalmic suspension Commonly known as: PRED FORTE Place 1 drop into the left eye 4 (four) times daily.   sevelamer  carbonate 800 MG tablet Commonly known as: RENVELA  Take 800 mg by mouth 3 (three) times daily with meals.   traZODone  50 MG tablet Commonly known as: DESYREL  Take 1 tablet (50 mg total) by mouth at bedtime.        Allergies:  Allergies  Allergen Reactions   Penicillins Itching and Other (See Comments)  redness   Strawberry (Diagnostic) Itching and Swelling   Tomato Itching and Swelling    Past Medical History, Surgical history, Social history, and Family History were reviewed and updated.  Review of Systems: All other 10 point review of systems is negative.   Physical Exam:  height is 5\' 7"  (1.702 m) and weight is 124 lb (56.2 kg). Her oral temperature is 98.1 F (36.7 C). Her blood pressure is 144/56 (abnormal) and her pulse is 90. Her respiration is 19 and oxygen saturation is 97%.   Wt Readings from Last 3 Encounters:  12/16/23 124 lb (56.2 kg)  11/20/23 123 lb 3.2 oz (55.9 kg)  11/04/23 120 lb (54.4 kg)    Ocular: Sclerae  unicteric, pupils equal, round and reactive to light Ear-nose-throat: Oropharynx clear, dentition fair Lymphatic: No cervical or supraclavicular adenopathy Lungs no rales or rhonchi, good excursion bilaterally Heart regular rate and rhythm, no murmur appreciated Abd soft, nontender, positive bowel sounds MSK no focal spinal tenderness, no joint edema Neuro: non-focal, well-oriented, appropriate affect Breasts: Deferred   Lab Results  Component Value Date   WBC 7.2 12/16/2023   HGB 9.1 (L) 12/16/2023   HCT 28.9 (L) 12/16/2023   MCV 95.7 12/16/2023   PLT 142 (L) 12/16/2023   Lab Results  Component Value Date   FERRITIN 1,212 (H) 11/25/2023   IRON 119 11/25/2023   TIBC 281 11/25/2023   UIBC 162 11/25/2023   IRONPCTSAT 42 (H) 11/25/2023   Lab Results  Component Value Date   RETICCTPCT 1.9 01/08/2023   RBC 3.02 (L) 12/16/2023   Lab Results  Component Value Date   KPAFRELGTCHN 123.1 (H) 01/31/2022   LAMBDASER 36.3 (H) 01/31/2022   KAPLAMBRATIO 3.39 (H) 01/31/2022   Lab Results  Component Value Date   IGGSERUM 768 05/09/2021   IGA 150 05/09/2021   IGMSERUM 60 05/09/2021   Lab Results  Component Value Date   TOTALPROTELP 5.3 (L) 01/31/2022   ALBUMINELP 1.8 (L) 01/31/2022   A1GS 0.4 01/31/2022   A2GS 1.3 (H) 01/31/2022   BETS 1.0 01/31/2022   GAMS 0.8 01/31/2022   MSPIKE Not Observed 01/31/2022   SPEI Comment 01/31/2022     Chemistry      Component Value Date/Time   NA 139 11/25/2023 1118   NA 141 03/11/2023 1220   K 4.9 11/25/2023 1118   CL 101 11/25/2023 1118   CO2 24 11/25/2023 1118   BUN 32 (H) 11/25/2023 1118   BUN 26 03/11/2023 1220   CREATININE 5.21 (H) 11/25/2023 1118      Component Value Date/Time   CALCIUM  8.6 (L) 11/25/2023 1118   ALKPHOS 76 11/25/2023 1118   AST 13 (L) 11/25/2023 1118   ALT 13 11/25/2023 1118   BILITOT 0.2 11/25/2023 1118       Impression and Plan: Amber Cross is a very nice 63 year old African-American female.  She has  history of limited stage small cell lung cancer in addition to ESRD.  Unfortunately, because of her overall health status, she really could not have combined radiation therapy and chemotherapy.  She is tolerating Tecentriq  well so far. We will proceed with treatment today as planned.  She will also received ESA for Hgb 9.1.  Follow-up in 3 weeks.   Kennard Pea, NP 5/20/20259:43 AM

## 2023-12-16 NOTE — Patient Instructions (Signed)
 [facility] - A DEPT OF [facility]  Discharge Instructions: Thank you for choosing [facility] to provide your oncology and hematology care.   If you have a lab appointment with the Cancer Center, please go directly to the Cancer Center and check in at the registration area.  Wear comfortable clothing and clothing appropriate for easy access to any Portacath or PICC line.   We strive to give you quality time with your provider. You may need to reschedule your appointment if you arrive late (15 or more minutes).  Arriving late affects you and other patients whose appointments are after yours.  Also, if you miss three or more appointments without notifying the office, you may be dismissed from the clinic at the provider's discretion.      For prescription refill requests, have your pharmacy contact our office and allow 72 hours for refills to be completed.    Today you received the following chemotherapy and/or immunotherapy agents tecentriq       To help prevent nausea and vomiting after your treatment, we encourage you to take your nausea medication as directed.  BELOW ARE SYMPTOMS THAT SHOULD BE REPORTED IMMEDIATELY: *FEVER GREATER THAN 100.4 F (38 C) OR HIGHER *CHILLS OR SWEATING *NAUSEA AND VOMITING THAT IS NOT CONTROLLED WITH YOUR NAUSEA MEDICATION *UNUSUAL SHORTNESS OF BREATH *UNUSUAL BRUISING OR BLEEDING *URINARY PROBLEMS (pain or burning when urinating, or frequent urination) *BOWEL PROBLEMS (unusual diarrhea, constipation, pain near the anus) TENDERNESS IN MOUTH AND THROAT WITH OR WITHOUT PRESENCE OF ULCERS (sore throat, sores in mouth, or a toothache) UNUSUAL RASH, SWELLING OR PAIN  UNUSUAL VAGINAL DISCHARGE OR ITCHING   Items with * indicate a potential emergency and should be followed up as soon as possible or go to the Emergency Department if any problems should occur.  Please show the CHEMOTHERAPY ALERT CARD or IMMUNOTHERAPY  ALERT CARD at check-in to the Emergency Department and triage nurse. Should you have questions after your visit or need to cancel or reschedule your appointment, please contact [facility] - A DEPT OF [facility]  [phone] and follow the prompts.  Office hours are 8:00 a.m. to 4:30 p.m. Monday - Friday. Please note that voicemails left after 4:00 p.m. may not be returned until the following business day.  We are closed weekends and major holidays. You have access to a nurse at all times for urgent questions. Please call the main number to the clinic [phone] and follow the prompts.  For any non-urgent questions, you may also contact your provider using MyChart. We now offer e-Visits for anyone 28 and older to request care online for non-urgent symptoms. For details visit [email or web address].   Also download the MyChart app! Go to the app store, search "MyChart", open the app, select [place], and log in with your MyChart username and password.

## 2023-12-17 ENCOUNTER — Other Ambulatory Visit: Payer: Self-pay

## 2023-12-17 DIAGNOSIS — Z992 Dependence on renal dialysis: Secondary | ICD-10-CM | POA: Diagnosis not present

## 2023-12-17 DIAGNOSIS — N2581 Secondary hyperparathyroidism of renal origin: Secondary | ICD-10-CM | POA: Diagnosis not present

## 2023-12-17 DIAGNOSIS — N186 End stage renal disease: Secondary | ICD-10-CM | POA: Diagnosis not present

## 2023-12-17 DIAGNOSIS — E1122 Type 2 diabetes mellitus with diabetic chronic kidney disease: Secondary | ICD-10-CM | POA: Diagnosis not present

## 2023-12-18 NOTE — Progress Notes (Signed)
 [facility] - Clinic Note  12/30/2023     CHIEF COMPLAINT Patient presents for Retina Follow Up   HISTORY OF PRESENT ILLNESS: Amber Cross is a 63 y.o. female who presents to the clinic today for:  HPI     Retina Follow Up   Patient presents with  Diabetic Retinopathy.  In both eyes.  This started 5 weeks ago.  Duration of 5 weeks.  Since onset it is stable.  I, the attending physician,  performed the HPI with the patient and updated documentation appropriately.        Comments   5 week retina follow up NPDR and IVA OD IVE OD pt is reporting no vision changes noticed she denies any flashes or floaters she is not currently checking her blood sugar she stopped taking diabetic meds in Jan/Feb due to not wanting to take them and feels like she is doing better without       Last edited by Ronelle Coffee, MD on 01/03/2024 10:40 AM.    Pt states she just got new glasses, but she cannot see out of them, she can't get used to the bifocal  Referring physician: Franky Ivanoff, PA-C  [facility] [address] [place],  [place] [address]  HISTORICAL INFORMATION:  Selected notes from the MEDICAL RECORD NUMBER Referred by Franky Ivanoff, PA-C for concern of CME / PDR LEE:  Ocular Hx- PMH-   CURRENT MEDICATIONS: Current Outpatient Medications (Ophthalmic Drugs)  Medication Sig   prednisoLONE acetate (PRED FORTE) 1 % ophthalmic suspension Place 1 drop into the left eye 4 (four) times daily.   No current facility-administered medications for this visit. (Ophthalmic Drugs)   Current Outpatient Medications (Other)  Medication Sig   amlodipine -olmesartan  (AZOR ) 10-20 MG tablet Take 1 tablet by mouth at bedtime.   atorvastatin  (LIPITOR ) 80 MG tablet Take 1 tablet (80 mg total) by mouth daily.   Calcium  Acetate 668 (169 Ca) MG TABS Take 1 tablet by mouth 3 (three) times daily.   dronabinol  (MARINOL ) 2.5 MG capsule Take 1 capsule (2.5 mg total) by  mouth 2 (two) times daily before a meal.   fluticasone  (FLONASE ) 50 MCG/ACT nasal spray SPRAY 2 SPRAYS INTO EACH NOSTRIL EVERY DAY   furosemide  (LASIX ) 40 MG tablet Take 1 tablet (40 mg total) by mouth daily as needed (swelling in legs or weight gain).   gabapentin  (NEURONTIN ) 600 MG tablet Take 600 mg by mouth daily.   hydrALAZINE  (APRESOLINE ) 100 MG tablet Take 1 tablet (100 mg total) by mouth 2 (two) times daily.   Lancet Devices (ADJUSTABLE LANCING DEVICE) MISC    megestrol  (MEGACE  ES) 625 MG/5ML suspension Take 5 mLs (625 mg total) by mouth daily.   pantoprazole  (PROTONIX ) 40 MG tablet Take 40 mg by mouth daily.   sevelamer  carbonate (RENVELA ) 800 MG tablet Take 800 mg by mouth 3 (three) times daily with meals.   traZODone  (DESYREL ) 50 MG tablet Take 1 tablet (50 mg total) by mouth at bedtime.   No current facility-administered medications for this visit. (Other)   REVIEW OF SYSTEMS: ROS   Positive for: Musculoskeletal, Endocrine, Cardiovascular, Eyes Negative for: Constitutional, Gastrointestinal, Neurological, Skin, Genitourinary, HENT, Respiratory, Psychiatric, Allergic/Imm, Heme/Lymph Last edited by Alise Appl, COT on 12/30/2023  1:22 PM.      ALLERGIES Allergies  Allergen Reactions   Penicillins Itching and Other (See Comments)    redness   Strawberry (Diagnostic) Itching and Swelling   Tomato  Itching and Swelling   PAST MEDICAL HISTORY 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   Postop check 08/05/2022   Pseudogout    Stroke  (HCC) 10/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   Tamponade 06/18/2022   Tobacco abuse    Past Surgical History:  Procedure Laterality Date   ANTERIOR CRUCIATE LIGAMENT REPAIR Right    AV FISTULA PLACEMENT Left 07/01/2022   Procedure: LEFT ARTERIOVENOUS (AV) FISTULA CREATION;  Surgeon: Dannis Dy, MD;  Location: [facility] OR;  Service: Vascular;  Laterality: Left;  with regional block   BRONCHIAL BIOPSY  12/18/2021   Procedure: BRONCHIAL BIOPSIES;  Surgeon: Prudy Brownie, DO;  Location: [facility] ENDOSCOPY;  Service: Pulmonary;;   BRONCHIAL BRUSHINGS  12/18/2021   Procedure: BRONCHIAL BRUSHINGS;  Surgeon: Prudy Brownie, DO;  Location: [facility];  Service: Pulmonary;;   BRONCHIAL NEEDLE ASPIRATION BIOPSY  12/18/2021   Procedure: BRONCHIAL NEEDLE ASPIRATION BIOPSIES;  Surgeon: Prudy Brownie, DO;  Location: [facility];  Service: Pulmonary;;   CATARACT EXTRACTION     CHEST TUBE INSERTION Right 06/18/2022   Procedure: CHEST TUBE INSERTION;  Surgeon: Shon Downing, MD;  Location: [facility] OR;  Service: Thoracic;  Laterality: Right;   EXCHANGE OF A DIALYSIS CATHETER N/A 08/08/2022   Procedure: EXCHANGE OF A TUNNELED DIALYSIS CATHETER;  Surgeon: Margherita Shell, MD;  Location: [facility] OR;  Service: Vascular;  Laterality: N/A;   FIDUCIAL MARKER PLACEMENT  12/18/2021   Procedure: FIDUCIAL MARKER PLACEMENT;  Surgeon: Prudy Brownie, DO;  Location: [facility];  Service: Pulmonary;;   IR FLUORO GUIDE CV LINE RIGHT  06/26/2022   IR US  GUIDE VASC ACCESS RIGHT  06/26/2022   OVARY SURGERY     RIGHT OOPHORECTOMY Right    SMALL INTESTINE SURGERY     SUBXYPHOID PERICARDIAL WINDOW N/A 06/18/2022   Procedure: SUBXYPHOID PERICARDIAL WINDOW;  Surgeon: Shon Downing, MD;  Location: [facility] OR;  Service: Thoracic;  Laterality: N/A;   TEE WITHOUT CARDIOVERSION N/A 06/18/2022   Procedure: TRANSESOPHAGEAL ECHOCARDIOGRAM (TEE);  Surgeon: Shon Downing, MD;  Location: [facility] OR;  Service: Thoracic;  Laterality: N/A;   TUBAL LIGATION      VIDEO BRONCHOSCOPY WITH RADIAL ENDOBRONCHIAL ULTRASOUND  12/18/2021   Procedure: RADIAL ENDOBRONCHIAL ULTRASOUND;  Surgeon: Prudy Brownie, DO;  Location: [facility];  Service: Pulmonary;;   FAMILY HISTORY Family History  Problem Relation Age of Onset   Diabetes Mother    Hypertension Mother    Hypertension Father    SOCIAL HISTORY Social History   Tobacco Use   Smoking status: Every Day    Current packs/day: 0.50    Average packs/day: 0.5 packs/day for 40.0 years (20.0 ttl pk-yrs)    Types: Cigarettes   Smokeless tobacco: Never   Tobacco comments:    Cutting back 1/4-1/2 ppd  Vaping Use   Vaping status: Never Used  Substance Use Topics   Alcohol use: Not Currently   Drug use: Yes    Frequency: 4.0 times per  week    Types: Marijuana    Comment: smokes every other day      OPHTHALMIC EXAM:  Base Eye Exam     Visual Acuity (Snellen - Linear)       Right Left   Dist [place] 20/50 -1 20/40 -2   Dist ph [place] 20/40 -2 NI         Tonometry (Tonopen, 1:28 PM)       Right Left   Pressure 15 15         Pupils       Pupils Dark Light Shape React APD   Right PERRL 3 3 Round Minimal None   Left PERRL 3 3 Round Minimal None         Visual Fields       Left Right    Full Full         Extraocular Movement       Right Left    Full, Ortho Full, Ortho         Neuro/Psych     Oriented x3: Yes   Mood/Affect: Normal         Dilation     Both eyes: 2.5% Phenylephrine  @ 1:28 PM           Slit Lamp and Fundus Exam     Slit Lamp Exam       Right Left   Lids/Lashes Dermatochalasis - upper lid, mild MGD Dermatochalasis - upper lid, mild MGD   Conjunctiva/Sclera nasal pingeucula, Melanosis nasal pingeucula, mild Melanosis   Cornea arcus, well healed cataract wound, nasal LRI, trace Punctate epithelial erosions arcus, trace Punctate epithelial erosions, trace tear film debris, well healed cataract wound, fine endo pigment   Anterior Chamber deep  and clear deep and clear, no cell or flare   Iris Round and dilated Round and dilated   Lens PC IOL in good position PC IOL in good position   Anterior Vitreous mild syneresis, Posterior vitreous detachment mild syneresis         Fundus Exam       Right Left   Disc Pink and Sharp, temporal pallor, temporal PPP Pink and Sharp, temporal pallor, temporal PPP, no NVD, +SVP   C/D Ratio 0.3 0.3   Macula Good foveal reflex, scattered MA, DBH and punctate exudates -- improved, +cystic changes -- improved good foveal reflex, dense central cluster of exudates -- improved, scattered MA, DBH and central edema -- improved   Vessels attenuated, Tortuous, no NV attenuated, Tortuous, no NV   Periphery Attached, scattered MA Attached, rare MA           Refraction     Wearing Rx       Sphere Cylinder Axis Add   Right +1.50 +0.50 167 +2.50   Left -0.50 +0.50 121 +2.50    Type: Bifocal           IMAGING AND PROCEDURES  Imaging and Procedures for 12/30/2023  OCT, Retina - OU - Both Eyes        Right Eye Quality was good. Central Foveal Thickness: 230. Progression has improved. Findings include normal foveal contour, no SRF, intraretinal hyper-reflective material, intraretinal fluid (persistent IRF / IRHM greatest temporal and superior mac -- slightly improved).   Left Eye Quality was good. Central Foveal Thickness: 183. Progression has improved. Findings include no SRF, abnormal foveal contour, subretinal hyper-reflective material, intraretinal hyper-reflective material, intraretinal fluid, outer retinal atrophy (Persistent IRF/IRHM greatest IT macula -- improved).  Notes  *Images captured and stored on drive  Diagnosis / Impression:  DME OU OD: persistent IRF / IRHM greatest temporal and superior mac -- slightly improved OS: Persistent IRF/IRHM greatest IT macula -- improved  Clinical management:  See below  Abbreviations: NFP - Normal foveal profile. CME - cystoid macular  edema. PED - pigment epithelial detachment. IRF - intraretinal fluid. SRF - subretinal fluid. EZ - ellipsoid zone. ERM - epiretinal membrane. ORA - outer retinal atrophy. ORT - outer retinal tubulation. SRHM - subretinal hyper-reflective material. IRHM - intraretinal hyper-reflective material      Intravitreal Injection, Pharmacologic Agent - OD - Right Eye       Time Out 12/30/2023. 2:18 PM. Confirmed correct patient, procedure, site, and patient consented.   Anesthesia Topical anesthesia was used. Anesthetic medications included Lidocaine  2%, Proparacaine 0.5%.   Procedure Preparation included 5% betadine to ocular surface, eyelid speculum. A supplied (32g) needle was used.   Injection: 1.25 mg Bevacizumab  1.25mg /0.21ml   Route: Intravitreal, Site: Right Eye   NDC: [id number], Lot: [id number], Expiration date: 02/06/2024   Post-op Post injection exam found visual acuity of at least counting fingers. The patient tolerated the procedure well. There were no complications. The patient received written and verbal post procedure care education.      Intravitreal Injection, Pharmacologic Agent - OS - Left Eye       Time Out 12/30/2023. 2:19 PM. Confirmed correct patient, procedure, site, and patient consented.   Anesthesia Topical anesthesia was used. Anesthetic medications included Lidocaine  2%, Proparacaine 0.5%.   Procedure Preparation included 5% betadine to ocular surface, eyelid speculum. A (32g) needle was used.   Injection: 2 mg aflibercept  2 MG/0.05ML   Route: Intravitreal, Site: Left Eye   NDC: Q956576, Lot: [id number], Expiration date: 01/25/2025, Waste: 0 mL   Post-op Post injection exam found visual acuity of at least counting fingers. The patient tolerated the procedure well. There were no complications. The patient received written and verbal post procedure care education.            ASSESSMENT/PLAN:   ICD-10-CM   1. 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 Current use of insulin  (HCC)  Z79.4     3. Essential hypertension  I10     4. Hypertensive retinopathy of both eyes  H35.033     5. Pseudophakia, both eyes  Z96.1       1,2 Severe Non-proliferative diabetic retinopathy, both eyes  - A1C 7.8 (08.13.24), 10.4 (05.09.24), 6.7 (11.30.23) - s/p IVA OD #1 (05.02.24), #2 (05.30.24), #3 (07.24.24), #4 (08.21.24), #5 (10.08.24), #6 (11.05.24), #7 (12.10.24), #8 (01.14.25), #9 (02.18.25), #10 (03.25.25), #11 (04.29.25) - s/p IVA OS #1 (04.04.24), #2 (05.02.24), #3 (05.30.24), #4 (07.24.24), #5 (08.21.24) -- IVA resistance OS - s/p IVE OS #1 (10.08.24), #2 (11.05.24), #3 (12.10.24), #4 (01.14.25), #5 (02.18.25), #6 (03.25.25), #7 (04.29.25) - BCVA OD 20/40 from 20/25, OS 20/40 from 20/25 - exam shows scattered MA/DBH and exudates OU (OS > OD) - FA (04.04.24) shows leaking MA greatest posteriorly, no NV OU - OCT shows  OD: persistent IRF / IRHM greatest temporal and superior mac;  OS: Persistent IRF/IRHM greatest IT macula at 5 weeks - recommend IVA OD #12 and IVE OS #8 today,06.03.25 for DME w/ f/u in 5 wks again - pt wishes to proceed - RBA of procedure discussed, questions answered - Eylea  approved with insurance - IVA informed consent obtained and signed, 04.04.24 (OU) - IVE informed consent obtained and signed, 10.08.24 (OU) - see procedure note - f/u in 5 wks -- DFE/OCT, possible injection  3,4. Hypertensive retinopathy OU - discussed importance of tight BP control - monitor  5. Pseudophakia OU  - s/p CE/IOL OU (Dr. Candi Chafe, OD: 2017, OS: 03.04.25)  - IOL in good position, doing well  -  monitor  Ophthalmic Meds Ordered this visit:  Meds ordered this encounter  Medications   aflibercept  (EYLEA ) SOLN 2 mg   Bevacizumab  (AVASTIN ) SOLN 1.25 mg    Return in about 5 weeks (around 02/03/2024) for f/u NPDR OU, DFE, OCT, Possible Injxn.  There are no Patient Instructions on file for this visit.  Explained the diagnoses, plan, and follow up with the patient and they expressed understanding.  Patient expressed understanding of the importance of proper follow up care.   This document serves as a record of services personally performed by Jeanice Millard, MD, PhD. It was created on their behalf by Olene Berne, COT an ophthalmic technician. The creation of this record is the provider's dictation and/or activities during the visit.    Electronically signed by:  Olene Berne, COT  01/03/24 10:40 AM  This document serves as a record of services personally performed by Jeanice Millard, MD, PhD. It was created on their behalf by Morley Arabia. Bevin Bucks, OA an ophthalmic technician. The creation of this record is the provider's dictation and/or activities during the visit.    Electronically signed by: Morley Arabia. Bevin Bucks, OA 01/03/24 10:40 AM  Jeanice Millard, M.D., Ph.D. Diseases & Surgery of the Retina and Vitreous [facility]  I have reviewed the above documentation for accuracy and completeness, and I agree with the above. Jeanice Millard, M.D., Ph.D. 01/03/24 10:41 AM   Abbreviations: M myopia (nearsighted); A astigmatism; H hyperopia (farsighted); P presbyopia; Mrx spectacle prescription;  CTL contact lenses; OD right eye; OS left eye; OU both eyes  XT exotropia; ET esotropia; PEK punctate epithelial keratitis; PEE punctate epithelial erosions; DES dry eye syndrome; MGD meibomian gland dysfunction; ATs artificial tears; PFAT's preservative free artificial tears; NSC nuclear sclerotic cataract; PSC posterior subcapsular cataract; ERM epi-retinal membrane; PVD  posterior vitreous detachment; RD retinal detachment; DM diabetes mellitus; DR diabetic retinopathy; NPDR non-proliferative diabetic retinopathy; PDR proliferative diabetic retinopathy; CSME clinically significant macular edema; DME diabetic macular edema; dbh dot blot hemorrhages; CWS cotton wool spot; POAG primary open angle glaucoma; C/D cup-to-disc ratio; HVF humphrey visual field; GVF goldmann visual field; OCT optical coherence tomography; IOP intraocular pressure; BRVO Branch retinal vein occlusion; CRVO central retinal vein occlusion; CRAO central retinal artery occlusion; BRAO branch retinal artery occlusion; RT retinal tear; SB scleral buckle; PPV pars plana vitrectomy; VH Vitreous hemorrhage; PRP panretinal laser photocoagulation; IVK intravitreal kenalog; VMT vitreomacular traction; MH Macular hole;  NVD neovascularization of the disc; NVE neovascularization elsewhere; AREDS age related eye disease study; ARMD age related macular degeneration; POAG primary open angle glaucoma; EBMD epithelial/anterior basement membrane dystrophy; ACIOL anterior chamber intraocular lens; IOL intraocular lens; PCIOL posterior chamber intraocular lens; Phaco/IOL phacoemulsification with intraocular lens placement; PRK photorefractive keratectomy; LASIK laser assisted in situ keratomileusis; HTN hypertension;  DM diabetes mellitus; COPD chronic obstructive pulmonary disease

## 2023-12-19 DIAGNOSIS — N186 End stage renal disease: Secondary | ICD-10-CM | POA: Diagnosis not present

## 2023-12-19 DIAGNOSIS — E1122 Type 2 diabetes mellitus with diabetic chronic kidney disease: Secondary | ICD-10-CM | POA: Diagnosis not present

## 2023-12-19 DIAGNOSIS — N2581 Secondary hyperparathyroidism of renal origin: Secondary | ICD-10-CM | POA: Diagnosis not present

## 2023-12-19 DIAGNOSIS — Z992 Dependence on renal dialysis: Secondary | ICD-10-CM | POA: Diagnosis not present

## 2023-12-19 NOTE — Telephone Encounter (Unsigned)
 Copied from CRM [id number]. Topic: Clinical - Medical Advice >> Dec 19, 2023  4:14 PM Magdalene School wrote: Reason for CRM: Simonne Dubonnet, NP with [employer] relayed a message noting that a murmur was heard on exam. The patient was not aware of this finding, so she wanted to ensure the primary care provider is informed.  Call-back number: [phone]

## 2023-12-22 DIAGNOSIS — N2581 Secondary hyperparathyroidism of renal origin: Secondary | ICD-10-CM | POA: Diagnosis not present

## 2023-12-22 DIAGNOSIS — N186 End stage renal disease: Secondary | ICD-10-CM | POA: Diagnosis not present

## 2023-12-22 DIAGNOSIS — Z992 Dependence on renal dialysis: Secondary | ICD-10-CM | POA: Diagnosis not present

## 2023-12-22 DIAGNOSIS — E1122 Type 2 diabetes mellitus with diabetic chronic kidney disease: Secondary | ICD-10-CM | POA: Diagnosis not present

## 2023-12-24 ENCOUNTER — Other Ambulatory Visit: Payer: Self-pay

## 2023-12-24 DIAGNOSIS — N186 End stage renal disease: Secondary | ICD-10-CM | POA: Diagnosis not present

## 2023-12-24 DIAGNOSIS — E1122 Type 2 diabetes mellitus with diabetic chronic kidney disease: Secondary | ICD-10-CM | POA: Diagnosis not present

## 2023-12-24 DIAGNOSIS — N2581 Secondary hyperparathyroidism of renal origin: Secondary | ICD-10-CM | POA: Diagnosis not present

## 2023-12-24 DIAGNOSIS — Z992 Dependence on renal dialysis: Secondary | ICD-10-CM | POA: Diagnosis not present

## 2023-12-26 DIAGNOSIS — N2581 Secondary hyperparathyroidism of renal origin: Secondary | ICD-10-CM | POA: Diagnosis not present

## 2023-12-26 DIAGNOSIS — N186 End stage renal disease: Secondary | ICD-10-CM | POA: Diagnosis not present

## 2023-12-26 DIAGNOSIS — Z992 Dependence on renal dialysis: Secondary | ICD-10-CM | POA: Diagnosis not present

## 2023-12-26 DIAGNOSIS — E1122 Type 2 diabetes mellitus with diabetic chronic kidney disease: Secondary | ICD-10-CM | POA: Diagnosis not present

## 2023-12-27 DIAGNOSIS — I129 Hypertensive chronic kidney disease with stage 1 through stage 4 chronic kidney disease, or unspecified chronic kidney disease: Secondary | ICD-10-CM | POA: Diagnosis not present

## 2023-12-27 DIAGNOSIS — N186 End stage renal disease: Secondary | ICD-10-CM | POA: Diagnosis not present

## 2023-12-27 DIAGNOSIS — Z992 Dependence on renal dialysis: Secondary | ICD-10-CM | POA: Diagnosis not present

## 2023-12-29 DIAGNOSIS — N2581 Secondary hyperparathyroidism of renal origin: Secondary | ICD-10-CM | POA: Diagnosis not present

## 2023-12-29 DIAGNOSIS — E1122 Type 2 diabetes mellitus with diabetic chronic kidney disease: Secondary | ICD-10-CM | POA: Diagnosis not present

## 2023-12-29 DIAGNOSIS — Z992 Dependence on renal dialysis: Secondary | ICD-10-CM | POA: Diagnosis not present

## 2023-12-29 DIAGNOSIS — N186 End stage renal disease: Secondary | ICD-10-CM | POA: Diagnosis not present

## 2023-12-30 ENCOUNTER — Ambulatory Visit (INDEPENDENT_AMBULATORY_CARE_PROVIDER_SITE_OTHER): Admitting: Ophthalmology

## 2023-12-30 ENCOUNTER — Encounter (INDEPENDENT_AMBULATORY_CARE_PROVIDER_SITE_OTHER): Payer: Self-pay | Admitting: Ophthalmology

## 2023-12-30 DIAGNOSIS — H35033 Hypertensive retinopathy, bilateral: Secondary | ICD-10-CM | POA: Diagnosis not present

## 2023-12-30 DIAGNOSIS — I10 Essential (primary) hypertension: Secondary | ICD-10-CM | POA: Diagnosis not present

## 2023-12-30 DIAGNOSIS — Z794 Long term (current) use of insulin: Secondary | ICD-10-CM

## 2023-12-30 DIAGNOSIS — Z961 Presence of intraocular lens: Secondary | ICD-10-CM | POA: Diagnosis not present

## 2023-12-30 DIAGNOSIS — E113413 Type 2 diabetes mellitus with severe nonproliferative diabetic retinopathy with macular edema, bilateral: Secondary | ICD-10-CM

## 2023-12-30 MED ORDER — AFLIBERCEPT 2MG/0.05ML IZ SOLN FOR KALEIDOSCOPE
2.0000 mg | INTRAVITREAL | Status: AC | PRN
Start: 2023-12-30 — End: 2023-12-30
  Administered 2023-12-30: 2 mg via INTRAVITREAL

## 2023-12-30 MED ORDER — BEVACIZUMAB CHEMO INJECTION 1.25MG/0.05ML SYRINGE FOR KALEIDOSCOPE
1.2500 mg | INTRAVITREAL | Status: AC | PRN
  Administered 2023-12-30: 1.25 mg via INTRAVITREAL

## 2023-12-31 DIAGNOSIS — Z992 Dependence on renal dialysis: Secondary | ICD-10-CM | POA: Diagnosis not present

## 2023-12-31 DIAGNOSIS — N186 End stage renal disease: Secondary | ICD-10-CM | POA: Diagnosis not present

## 2023-12-31 DIAGNOSIS — N2581 Secondary hyperparathyroidism of renal origin: Secondary | ICD-10-CM | POA: Diagnosis not present

## 2023-12-31 DIAGNOSIS — E1122 Type 2 diabetes mellitus with diabetic chronic kidney disease: Secondary | ICD-10-CM | POA: Diagnosis not present

## 2024-01-02 DIAGNOSIS — E1122 Type 2 diabetes mellitus with diabetic chronic kidney disease: Secondary | ICD-10-CM | POA: Diagnosis not present

## 2024-01-02 DIAGNOSIS — N186 End stage renal disease: Secondary | ICD-10-CM | POA: Diagnosis not present

## 2024-01-02 DIAGNOSIS — N2581 Secondary hyperparathyroidism of renal origin: Secondary | ICD-10-CM | POA: Diagnosis not present

## 2024-01-02 DIAGNOSIS — Z992 Dependence on renal dialysis: Secondary | ICD-10-CM | POA: Diagnosis not present

## 2024-01-05 DIAGNOSIS — Z992 Dependence on renal dialysis: Secondary | ICD-10-CM | POA: Diagnosis not present

## 2024-01-05 DIAGNOSIS — N2581 Secondary hyperparathyroidism of renal origin: Secondary | ICD-10-CM | POA: Diagnosis not present

## 2024-01-05 DIAGNOSIS — N186 End stage renal disease: Secondary | ICD-10-CM | POA: Diagnosis not present

## 2024-01-05 DIAGNOSIS — E1122 Type 2 diabetes mellitus with diabetic chronic kidney disease: Secondary | ICD-10-CM | POA: Diagnosis not present

## 2024-01-06 ENCOUNTER — Encounter: Payer: Self-pay | Admitting: Hematology & Oncology

## 2024-01-06 ENCOUNTER — Other Ambulatory Visit

## 2024-01-06 ENCOUNTER — Inpatient Hospital Stay: Attending: Radiation Oncology

## 2024-01-06 ENCOUNTER — Other Ambulatory Visit: Payer: Self-pay

## 2024-01-06 ENCOUNTER — Inpatient Hospital Stay (HOSPITAL_BASED_OUTPATIENT_CLINIC_OR_DEPARTMENT_OTHER): Admitting: Hematology & Oncology

## 2024-01-06 ENCOUNTER — Inpatient Hospital Stay

## 2024-01-06 VITALS — BP 165/64 | HR 97 | Temp 97.9°F | Resp 18 | Ht 67.0 in | Wt 124.0 lb

## 2024-01-06 VITALS — BP 149/75 | HR 91 | Temp 98.2°F | Resp 20

## 2024-01-06 DIAGNOSIS — C3491 Malignant neoplasm of unspecified part of right bronchus or lung: Secondary | ICD-10-CM | POA: Insufficient documentation

## 2024-01-06 DIAGNOSIS — Z79620 Long term (current) use of immunosuppressive biologic: Secondary | ICD-10-CM | POA: Diagnosis not present

## 2024-01-06 DIAGNOSIS — Z5112 Encounter for antineoplastic immunotherapy: Secondary | ICD-10-CM | POA: Insufficient documentation

## 2024-01-06 DIAGNOSIS — Z992 Dependence on renal dialysis: Secondary | ICD-10-CM | POA: Diagnosis not present

## 2024-01-06 DIAGNOSIS — N186 End stage renal disease: Secondary | ICD-10-CM | POA: Diagnosis not present

## 2024-01-06 DIAGNOSIS — C3411 Malignant neoplasm of upper lobe, right bronchus or lung: Secondary | ICD-10-CM

## 2024-01-06 DIAGNOSIS — D631 Anemia in chronic kidney disease: Secondary | ICD-10-CM | POA: Diagnosis not present

## 2024-01-06 DIAGNOSIS — J90 Pleural effusion, not elsewhere classified: Secondary | ICD-10-CM | POA: Diagnosis not present

## 2024-01-06 LAB — CMP (CANCER CENTER ONLY)
ALT: 14 U/L (ref 0–44)
AST: 14 U/L — ABNORMAL LOW (ref 15–41)
Albumin: 4.3 g/dL (ref 3.5–5.0)
Alkaline Phosphatase: 110 U/L (ref 38–126)
Anion gap: 14 (ref 5–15)
BUN: 41 mg/dL — ABNORMAL HIGH (ref 8–23)
CO2: 26 mmol/L (ref 22–32)
Calcium: 9.1 mg/dL (ref 8.9–10.3)
Chloride: 100 mmol/L (ref 98–111)
Creatinine: 5.08 mg/dL — ABNORMAL HIGH (ref 0.44–1.00)
GFR, Estimated: 9 mL/min — ABNORMAL LOW (ref >60)
Glucose, Bld: 199 mg/dL — ABNORMAL HIGH (ref 70–99)
Potassium: 5.5 mmol/L — ABNORMAL HIGH (ref 3.5–5.1)
Sodium: 140 mmol/L (ref 135–145)
Total Bilirubin: 0.3 mg/dL (ref 0.0–1.2)
Total Protein: 7.3 g/dL (ref 6.5–8.1)

## 2024-01-06 LAB — CBC WITH DIFFERENTIAL (CANCER CENTER ONLY)
Abs Immature Granulocytes: 0.01 10*3/uL (ref 0.00–0.07)
Basophils Absolute: 0 10*3/uL (ref 0.0–0.1)
Basophils Relative: 1 %
Eosinophils Absolute: 0.1 10*3/uL (ref 0.0–0.5)
Eosinophils Relative: 1 %
HCT: 39.1 % (ref 36.0–46.0)
Hemoglobin: 12.4 g/dL (ref 12.0–15.0)
Immature Granulocytes: 0 %
Lymphocytes Relative: 13 %
Lymphs Abs: 0.7 10*3/uL (ref 0.7–4.0)
MCH: 31.3 pg (ref 26.0–34.0)
MCHC: 31.7 g/dL (ref 30.0–36.0)
MCV: 98.7 fL (ref 80.0–100.0)
Monocytes Absolute: 0.4 10*3/uL (ref 0.1–1.0)
Monocytes Relative: 8 %
Neutro Abs: 4.5 10*3/uL (ref 1.7–7.7)
Neutrophils Relative %: 77 %
Platelet Count: 216 10*3/uL (ref 150–400)
RBC: 3.96 MIL/uL (ref 3.87–5.11)
RDW: 20.4 % — ABNORMAL HIGH (ref 11.5–15.5)
WBC Count: 5.8 10*3/uL (ref 4.0–10.5)
nRBC: 0 % (ref 0.0–0.2)

## 2024-01-06 LAB — LACTATE DEHYDROGENASE: LDH: 235 U/L — ABNORMAL HIGH (ref 98–192)

## 2024-01-06 MED ORDER — SODIUM CHLORIDE 0.9 % IV SOLN
1200.0000 mg | Freq: Once | INTRAVENOUS | Status: AC
  Administered 2024-01-06: 1200 mg via INTRAVENOUS
  Filled 2024-01-06: qty 20

## 2024-01-06 MED ORDER — SODIUM CHLORIDE 0.9 % IV SOLN: Freq: Once | INTRAVENOUS | Status: AC

## 2024-01-06 NOTE — Patient Instructions (Signed)
 [facility] - A DEPT OF [facility]  Discharge Instructions: Thank you for choosing [facility] to provide your oncology and hematology care.   If you have a lab appointment with the Cancer Center, please go directly to the Cancer Center and check in at the registration area.  Wear comfortable clothing and clothing appropriate for easy access to any Portacath or PICC line.   We strive to give you quality time with your provider. You may need to reschedule your appointment if you arrive late (15 or more minutes).  Arriving late affects you and other patients whose appointments are after yours.  Also, if you miss three or more appointments without notifying the office, you may be dismissed from the clinic at the provider's discretion.      For prescription refill requests, have your pharmacy contact our office and allow 72 hours for refills to be completed.    Today you received the following chemotherapy and/or immunotherapy agents Tecentriq      To help prevent nausea and vomiting after your treatment, we encourage you to take your nausea medication as directed.  BELOW ARE SYMPTOMS THAT SHOULD BE REPORTED IMMEDIATELY: *FEVER GREATER THAN 100.4 F (38 C) OR HIGHER *CHILLS OR SWEATING *NAUSEA AND VOMITING THAT IS NOT CONTROLLED WITH YOUR NAUSEA MEDICATION *UNUSUAL SHORTNESS OF BREATH *UNUSUAL BRUISING OR BLEEDING *URINARY PROBLEMS (pain or burning when urinating, or frequent urination) *BOWEL PROBLEMS (unusual diarrhea, constipation, pain near the anus) TENDERNESS IN MOUTH AND THROAT WITH OR WITHOUT PRESENCE OF ULCERS (sore throat, sores in mouth, or a toothache) UNUSUAL RASH, SWELLING OR PAIN  UNUSUAL VAGINAL DISCHARGE OR ITCHING   Items with * indicate a potential emergency and should be followed up as soon as possible or go to the Emergency Department if any problems should occur.  Please show the CHEMOTHERAPY ALERT CARD or IMMUNOTHERAPY  ALERT CARD at check-in to the Emergency Department and triage nurse. Should you have questions after your visit or need to cancel or reschedule your appointment, please contact [facility] - A DEPT OF [facility]  [phone] and follow the prompts.  Office hours are 8:00 a.m. to 4:30 p.m. Monday - Friday. Please note that voicemails left after 4:00 p.m. may not be returned until the following business day.  We are closed weekends and major holidays. You have access to a nurse at all times for urgent questions. Please call the main number to the clinic [phone] and follow the prompts.  For any non-urgent questions, you may also contact your provider using MyChart. We now offer e-Visits for anyone 74 and older to request care online for non-urgent symptoms. For details visit [email or web address].   Also download the MyChart app! Go to the app store, search "MyChart", open the app, select [facility], and log in with your MyChart username and password.

## 2024-01-06 NOTE — Progress Notes (Unsigned)
 Hematology and Oncology Follow Up Visit  Amber Cross [id number] [birth year] 63 y.o. 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 She is under some stress at home with family but overall seems to be doing fairly well.  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  She states that dialysis is going well.  She denies fever, chills, n/v, cough, rash, dizziness, SOB, chest pain, palpitations, abdominal pain or changes in bowel or bladder habits at this time.  No swelling noted in her extremities at this time.  No falls or syncope reported. She ambulates with her cane for added support.  Appetite comes and goes. She is on fluid restrictions of 32 oz a day which is hard for her. Weight is stable at 124 lbs.   ECOG Performance Status: 1 - Symptomatic but completely ambulatory  Medications:  Allergies as of 01/06/2024       Reactions   Penicillins Itching, Other (See Comments)   redness   Strawberry (diagnostic) Itching, Swelling   Tomato Itching, Swelling        Medication List        Accurate as of January 06, 2024 10:35 AM. If you have any questions, ask your nurse or doctor.          Adjustable Lancing Device Misc   amlodipine -olmesartan  10-20 MG tablet Commonly known as: AZOR  Take 1 tablet by mouth at bedtime.   atorvastatin  80 MG tablet Commonly known as: LIPITOR  Take 1 tablet (80 mg total) by mouth daily.   Calcium  Acetate 668 (169 Ca) MG Tabs Take 1 tablet by  mouth 3 (three) times daily.   dronabinol  2.5 MG capsule Commonly known as: MARINOL  Take 1 capsule (2.5 mg total) by mouth 2 (two) times daily before a meal.   fluticasone  50 MCG/ACT nasal spray Commonly known as: FLONASE  SPRAY 2 SPRAYS INTO EACH NOSTRIL EVERY DAY   furosemide  40 MG tablet Commonly known as: Lasix  Take 1 tablet (40 mg total) by mouth daily as needed (swelling in legs or weight gain).   gabapentin  600 MG tablet Commonly known as: NEURONTIN  Take 600 mg by mouth daily.   HECTOROL  IV Take 3 mcg by mouth 3 (three) times a week.   hydrALAZINE  100 MG tablet Commonly known as: APRESOLINE  Take 1 tablet (100 mg total) by mouth 2 (two) times daily.   megestrol  625 MG/5ML suspension Commonly known as: MEGACE  ES Take 5 mLs (625 mg total) by mouth daily.   pantoprazole  40 MG tablet Commonly known as: PROTONIX  Take 40 mg by mouth daily.   prednisoLONE acetate 1 % ophthalmic suspension Commonly known as: PRED FORTE Place 1 drop into the left eye 4 (four) times daily.   sevelamer  carbonate 800 MG tablet Commonly known as: RENVELA  Take 800 mg by mouth 3 (three) times daily with meals.   traZODone  50 MG tablet Commonly known as: DESYREL  Take 1 tablet (50 mg total) by mouth at bedtime.        Allergies:  Allergies  Allergen Reactions   Penicillins Itching and Other (See Comments)    redness   Strawberry (Diagnostic) Itching and Swelling   Tomato Itching and Swelling    Past Medical History, Surgical history, Social history, and Family History were reviewed and updated.  Review of Systems: All other 10 point review of systems is negative.   Physical Exam:  height is 5\' 7"  (1.702 m) and weight is 124 lb (56.2 kg). Her oral temperature is 97.9 F (36.6 C). Her blood pressure is 165/64 (abnormal) and her pulse is 97. Her respiration is 18 and oxygen saturation is 97%.   Wt Readings from Last 3 Encounters:  01/06/24 124 lb (56.2 kg)  12/16/23 124 lb (56.2  kg)  11/20/23 123 lb 3.2 oz (55.9 kg)    Ocular: Sclerae unicteric, pupils equal, round and reactive to light Ear-nose-throat: Oropharynx clear, dentition fair Lymphatic: No cervical or supraclavicular adenopathy Lungs no rales or rhonchi, good excursion bilaterally Heart regular rate and rhythm, no murmur appreciated Abd soft, nontender, positive bowel sounds MSK no focal spinal tenderness, no joint edema Neuro: non-focal, well-oriented, appropriate affect Breasts: Deferred   Lab Results  Component Value Date   WBC 5.8 01/06/2024   HGB 12.4 01/06/2024   HCT 39.1 01/06/2024   MCV 98.7 01/06/2024   PLT 216 01/06/2024   Lab Results  Component Value Date   FERRITIN 1,212 (H) 11/25/2023   IRON 119 11/25/2023   TIBC 281 11/25/2023   UIBC 162 11/25/2023   IRONPCTSAT 42 (H) 11/25/2023   Lab Results  Component Value Date   RETICCTPCT 1.9 01/08/2023   RBC 3.96 01/06/2024   Lab Results  Component Value Date   KPAFRELGTCHN 123.1 (H) 01/31/2022   LAMBDASER 36.3 (H) 01/31/2022   KAPLAMBRATIO 3.39 (H) 01/31/2022   Lab Results  Component Value Date   IGGSERUM 768 05/09/2021   IGA 150 05/09/2021   IGMSERUM 60 05/09/2021   Lab Results  Component Value Date   TOTALPROTELP 5.3 (L) 01/31/2022   ALBUMINELP 1.8 (L) 01/31/2022   A1GS 0.4 01/31/2022   A2GS 1.3 (H) 01/31/2022   BETS 1.0 01/31/2022   GAMS 0.8 01/31/2022   MSPIKE Not Observed 01/31/2022   SPEI Comment 01/31/2022     Chemistry      Component Value Date/Time   NA 140 01/06/2024 0950   NA 141 03/11/2023 1220   K 5.5 (H) 01/06/2024 0950   CL 100 01/06/2024 0950   CO2 26 01/06/2024 0950   BUN 41 (H) 01/06/2024 0950   BUN 26 03/11/2023 1220   CREATININE 5.08 (H) 01/06/2024 0950      Component Value Date/Time   CALCIUM  9.1 01/06/2024 0950   ALKPHOS 110 01/06/2024 0950   AST 14 (L) 01/06/2024 0950   ALT 14 01/06/2024 0950   BILITOT 0.3 01/06/2024 0950       Impression and Plan: Amber Cross is a very nice  63 year old African-American female.  She has history of limited stage small cell lung cancer in addition to ESRD.  Unfortunately, because of her overall health status, she really could not have combined radiation therapy and chemotherapy.  She is tolerating Tecentriq  well so far. We will proceed with treatment today as planned.  She will also received ESA for Hgb 9.1.  Follow-up in 3 weeks.   Ivor Mars, MD 6/10/202510:35 AM Right

## 2024-01-06 NOTE — Progress Notes (Signed)
 Reviewed pt labs with Dr. Maria Shiner and pt ok to treat despite creatinine and potassium.

## 2024-01-07 ENCOUNTER — Encounter: Payer: Self-pay | Admitting: Hematology & Oncology

## 2024-01-07 ENCOUNTER — Other Ambulatory Visit: Payer: Self-pay

## 2024-01-07 DIAGNOSIS — N186 End stage renal disease: Secondary | ICD-10-CM | POA: Diagnosis not present

## 2024-01-07 DIAGNOSIS — Z992 Dependence on renal dialysis: Secondary | ICD-10-CM | POA: Diagnosis not present

## 2024-01-07 DIAGNOSIS — N2581 Secondary hyperparathyroidism of renal origin: Secondary | ICD-10-CM | POA: Diagnosis not present

## 2024-01-07 DIAGNOSIS — E1122 Type 2 diabetes mellitus with diabetic chronic kidney disease: Secondary | ICD-10-CM | POA: Diagnosis not present

## 2024-01-09 DIAGNOSIS — E1122 Type 2 diabetes mellitus with diabetic chronic kidney disease: Secondary | ICD-10-CM | POA: Diagnosis not present

## 2024-01-09 DIAGNOSIS — N186 End stage renal disease: Secondary | ICD-10-CM | POA: Diagnosis not present

## 2024-01-09 DIAGNOSIS — N2581 Secondary hyperparathyroidism of renal origin: Secondary | ICD-10-CM | POA: Diagnosis not present

## 2024-01-09 DIAGNOSIS — Z992 Dependence on renal dialysis: Secondary | ICD-10-CM | POA: Diagnosis not present

## 2024-01-12 DIAGNOSIS — I120 Hypertensive chronic kidney disease with stage 5 chronic kidney disease or end stage renal disease: Secondary | ICD-10-CM | POA: Diagnosis not present

## 2024-01-12 DIAGNOSIS — Z743 Need for continuous supervision: Secondary | ICD-10-CM | POA: Diagnosis not present

## 2024-01-12 DIAGNOSIS — C3491 Malignant neoplasm of unspecified part of right bronchus or lung: Secondary | ICD-10-CM | POA: Diagnosis not present

## 2024-01-12 DIAGNOSIS — I132 Hypertensive heart and chronic kidney disease with heart failure and with stage 5 chronic kidney disease, or end stage renal disease: Secondary | ICD-10-CM | POA: Diagnosis not present

## 2024-01-12 DIAGNOSIS — R404 Transient alteration of awareness: Secondary | ICD-10-CM | POA: Diagnosis not present

## 2024-01-12 DIAGNOSIS — Z794 Long term (current) use of insulin: Secondary | ICD-10-CM | POA: Diagnosis not present

## 2024-01-12 DIAGNOSIS — I10 Essential (primary) hypertension: Secondary | ICD-10-CM | POA: Diagnosis not present

## 2024-01-12 DIAGNOSIS — I5023 Acute on chronic systolic (congestive) heart failure: Secondary | ICD-10-CM | POA: Diagnosis not present

## 2024-01-12 DIAGNOSIS — R400 Somnolence: Secondary | ICD-10-CM | POA: Diagnosis not present

## 2024-01-12 DIAGNOSIS — R918 Other nonspecific abnormal finding of lung field: Secondary | ICD-10-CM | POA: Diagnosis not present

## 2024-01-12 DIAGNOSIS — E875 Hyperkalemia: Secondary | ICD-10-CM | POA: Diagnosis not present

## 2024-01-12 DIAGNOSIS — D649 Anemia, unspecified: Secondary | ICD-10-CM | POA: Diagnosis not present

## 2024-01-12 DIAGNOSIS — Z87891 Personal history of nicotine dependence: Secondary | ICD-10-CM | POA: Diagnosis not present

## 2024-01-12 DIAGNOSIS — Z880 Allergy status to penicillin: Secondary | ICD-10-CM | POA: Diagnosis not present

## 2024-01-12 DIAGNOSIS — Z7982 Long term (current) use of aspirin: Secondary | ICD-10-CM | POA: Diagnosis not present

## 2024-01-12 DIAGNOSIS — N186 End stage renal disease: Secondary | ICD-10-CM | POA: Diagnosis not present

## 2024-01-12 DIAGNOSIS — Z5986 Financial insecurity: Secondary | ICD-10-CM | POA: Diagnosis not present

## 2024-01-12 DIAGNOSIS — Z5941 Food insecurity: Secondary | ICD-10-CM | POA: Diagnosis not present

## 2024-01-12 DIAGNOSIS — I509 Heart failure, unspecified: Secondary | ICD-10-CM | POA: Diagnosis not present

## 2024-01-12 DIAGNOSIS — G9389 Other specified disorders of brain: Secondary | ICD-10-CM | POA: Diagnosis not present

## 2024-01-12 DIAGNOSIS — T43215A Adverse effect of selective serotonin and norepinephrine reuptake inhibitors, initial encounter: Secondary | ICD-10-CM | POA: Diagnosis not present

## 2024-01-12 DIAGNOSIS — R9089 Other abnormal findings on diagnostic imaging of central nervous system: Secondary | ICD-10-CM | POA: Diagnosis not present

## 2024-01-12 DIAGNOSIS — G928 Other toxic encephalopathy: Secondary | ICD-10-CM | POA: Diagnosis not present

## 2024-01-12 DIAGNOSIS — R410 Disorientation, unspecified: Secondary | ICD-10-CM | POA: Diagnosis not present

## 2024-01-12 DIAGNOSIS — C7949 Secondary malignant neoplasm of other parts of nervous system: Secondary | ICD-10-CM | POA: Diagnosis not present

## 2024-01-12 DIAGNOSIS — Z923 Personal history of irradiation: Secondary | ICD-10-CM | POA: Diagnosis not present

## 2024-01-12 DIAGNOSIS — I429 Cardiomyopathy, unspecified: Secondary | ICD-10-CM | POA: Diagnosis not present

## 2024-01-12 DIAGNOSIS — R0902 Hypoxemia: Secondary | ICD-10-CM | POA: Diagnosis not present

## 2024-01-12 DIAGNOSIS — N2581 Secondary hyperparathyroidism of renal origin: Secondary | ICD-10-CM | POA: Diagnosis not present

## 2024-01-12 DIAGNOSIS — G9340 Encephalopathy, unspecified: Secondary | ICD-10-CM | POA: Diagnosis not present

## 2024-01-12 DIAGNOSIS — E785 Hyperlipidemia, unspecified: Secondary | ICD-10-CM | POA: Diagnosis not present

## 2024-01-12 DIAGNOSIS — Z8673 Personal history of transient ischemic attack (TIA), and cerebral infarction without residual deficits: Secondary | ICD-10-CM | POA: Diagnosis not present

## 2024-01-12 DIAGNOSIS — R9431 Abnormal electrocardiogram [ECG] [EKG]: Secondary | ICD-10-CM | POA: Diagnosis not present

## 2024-01-12 DIAGNOSIS — C7931 Secondary malignant neoplasm of brain: Secondary | ICD-10-CM | POA: Diagnosis not present

## 2024-01-12 DIAGNOSIS — J90 Pleural effusion, not elsewhere classified: Secondary | ICD-10-CM | POA: Diagnosis not present

## 2024-01-12 DIAGNOSIS — J9811 Atelectasis: Secondary | ICD-10-CM | POA: Diagnosis not present

## 2024-01-12 DIAGNOSIS — Z992 Dependence on renal dialysis: Secondary | ICD-10-CM | POA: Diagnosis not present

## 2024-01-12 DIAGNOSIS — Z79899 Other long term (current) drug therapy: Secondary | ICD-10-CM | POA: Diagnosis not present

## 2024-01-12 DIAGNOSIS — C3410 Malignant neoplasm of upper lobe, unspecified bronchus or lung: Secondary | ICD-10-CM | POA: Diagnosis not present

## 2024-01-12 DIAGNOSIS — E1122 Type 2 diabetes mellitus with diabetic chronic kidney disease: Secondary | ICD-10-CM | POA: Diagnosis not present

## 2024-01-13 DIAGNOSIS — C7949 Secondary malignant neoplasm of other parts of nervous system: Secondary | ICD-10-CM | POA: Diagnosis not present

## 2024-01-13 DIAGNOSIS — C7931 Secondary malignant neoplasm of brain: Secondary | ICD-10-CM | POA: Diagnosis not present

## 2024-01-16 ENCOUNTER — Telehealth: Payer: Self-pay

## 2024-01-16 DIAGNOSIS — Z992 Dependence on renal dialysis: Secondary | ICD-10-CM | POA: Diagnosis not present

## 2024-01-16 DIAGNOSIS — E1122 Type 2 diabetes mellitus with diabetic chronic kidney disease: Secondary | ICD-10-CM | POA: Diagnosis not present

## 2024-01-16 DIAGNOSIS — N186 End stage renal disease: Secondary | ICD-10-CM | POA: Diagnosis not present

## 2024-01-16 DIAGNOSIS — N2581 Secondary hyperparathyroidism of renal origin: Secondary | ICD-10-CM | POA: Diagnosis not present

## 2024-01-16 NOTE — Transitions of Care (Post Inpatient/ED Visit) (Signed)
 01/16/2024  Name: Lucinda Spells MRN: [id number] DOB: January 10, 1961  Today's TOC FU Call Status: Today's TOC FU Call Status:: Successful TOC FU Call Completed TOC FU Call Complete Date: 01/16/24 Patient's Name and Date of Birth confirmed.  Transition Care Management Follow-up Telephone Call Date of Discharge: 01/15/24 Discharge Facility: Other [facility]) Name of Other (Non-[facility]) Discharge Facility: [facility] Type of Discharge: Inpatient Admission Primary Inpatient Discharge Diagnosis:: Confusion; Unresponsive How have you been since you were released from the hospital?: Better Any questions or concerns?: No  Items Reviewed: Did you receive and understand the discharge instructions provided?: Yes (The patient is having some problems with memory recall) Medications obtained,verified, and reconciled?: Partial Review Completed Reason for Partial Mediation Review: The patient has some confusion but does recall most of the medications. She denies taking two medications Any new allergies since your discharge?: No Dietary orders reviewed?: Yes Type of Diet Ordered:: Renal Do you have support at home?: Yes People in Home [RPT]: child(ren), adult Name of Support/Comfort Primary Source: Richardine Chancy, son  Medications Reviewed Today: Medications Reviewed Today     Reviewed by Claudene Crystal, RN (Case Manager) on 01/16/24 at 1138  Med List Status: <None>   Medication Order Taking? Sig Documenting Provider Last Dose Status Informant  amLODipine  (NORVASC ) 10 MG tablet [id number] Yes Take 10 mg by mouth daily. [provider]  Active   amlodipine -olmesartan  (AZOR ) 10-20 MG tablet [id number]  Take 1 tablet by mouth at bedtime.  Patient not taking: Reported on 01/16/2024   Trenton Frock, PA-C  Active   atorvastatin  (LIPITOR ) 80 MG tablet [id number] Yes Take 1 tablet (80 mg total) by mouth daily. Amoako, Prince, MD  Active   Calcium  Acetate 668 (169 Ca) MG TABS  [id number] Yes Take 1 tablet by mouth 3 (three) times daily. [provider]  Active   Doxercalciferol  (HECTOROL  IV) [id number] Yes Take 3 mcg by mouth 3 (three) times a week. [provider]  Active   dronabinol  (MARINOL ) 2.5 MG capsule [id number] Yes Take 1 capsule (2.5 mg total) by mouth 2 (two) times daily before a meal. Ivor Mars, MD  Active   fluticasone  (FLONASE ) 50 MCG/ACT nasal spray [id number] Yes SPRAY 2 SPRAYS INTO EACH NOSTRIL EVERY DAY Trenton Frock, PA-C  Active   furosemide  (LASIX ) 40 MG tablet [id number] Yes Take 1 tablet (40 mg total) by mouth daily as needed (swelling in legs or weight gain). Jinwala, Sagar H, MD  Active            Med Note Ressie Cassette Feb 13, 2023  2:55 PM) 02/13/2023 Takes on non-dialysis days.  gabapentin  (NEURONTIN ) 600 MG tablet [id number] Yes Take 600 mg by mouth daily. [provider]  Active   hydrALAZINE  (APRESOLINE ) 100 MG tablet [id number] Yes Take 1 tablet (100 mg total) by mouth 2 (two) times daily. Trenton Frock, PA-C  Active   Lancet Devices (ADJUSTABLE LANCING DEVICE) MISC [id number] Yes  [provider]  Active   megestrol  (MEGACE  ES) 625 MG/5ML suspension [id number]  Take 5 mLs (625 mg total) by mouth daily.  Patient not taking: Reported on 01/16/2024   Ivor Mars, MD  Active   pantoprazole  (PROTONIX ) 40 MG tablet [id number] Yes Take 40 mg by mouth daily. [provider]  Active   prednisoLONE acetate (PRED FORTE) 1 % ophthalmic suspension [id number] Yes Place 1 drop into the left eye 4 (four) times daily. [provider]  Active  sevelamer  carbonate (RENVELA ) 800 MG tablet [id number] Yes Take 800 mg by mouth 3 (three) times daily with meals. [provider]  Active   traZODone  (DESYREL ) 50 MG tablet [id number] Yes Take 1 tablet (50 mg total) by mouth at bedtime. Fay Hoop, MD  Active             Home Care and Equipment/Supplies: Were Home Health Services  Ordered?: NA Any new equipment or medical supplies ordered?: NA  Functional Questionnaire: Do you need assistance with bathing/showering or dressing?: No Do you need assistance with meal preparation?: No Do you need assistance with eating?: No Do you have difficulty maintaining continence: No Do you need assistance with getting out of bed/getting out of a chair/moving?: No Do you have difficulty managing or taking your medications?: No  Follow up appointments reviewed: PCP Follow-up appointment confirmed?: Yes Date of PCP follow-up appointment?: 01/27/24 Follow-up Provider: Marshell Skelton [facility] Follow-up appointment confirmed?: Yes Date of Specialist follow-up appointment?: 01/22/24 Follow-Up Specialty Provider:: Gray Layman Do you need transportation to your follow-up appointment?: No Do you understand care options if your condition(s) worsen?: Yes-patient verbalized understanding  SDOH Interventions Today    Flowsheet Row Most Recent Value  SDOH Interventions   Food Insecurity Interventions Intervention Not Indicated  Housing Interventions Intervention Not Indicated  Transportation Interventions Intervention Not Indicated  Utilities Interventions Intervention Not Indicated    Goals Addressed             This Visit's Progress    VBCI Transitions of Care (TOC) Care Plan       Problems:  Recent Hospitalization for treatment of Lung cancer with brain mets Knowledge Deficit Related to Confusion related to progressive lung cancer and metastasis to the brain  Goal:  Over the next 30 days, the patient will not experience hospital readmission  Interventions:   Oncology: Assessment of understanding of oncology diagnosis:  Assessed patient understanding of cancer diagnosis and recommended treatment plan Reviewed upcoming provider appointments and treatment appointments Assessed available transportation to appointments and treatments. Has consistent/reliable  transportation: Yes Assessed support system. Has consistent/reliable family or other support: Yes Nutrition assessment performed  Patient Self Care Activities:  Attend all scheduled provider appointments Call pharmacy for medication refills 3-7 days in advance of running out of medications Call provider office for new concerns or questions  Notify RN Care Manager of [facility] call rescheduling needs Participate in Transition of Care Program/Attend [facility] scheduled calls Perform all self care activities independently  Take medications as prescribed    Plan:  Telephone follow up appointment with care management team member scheduled for:  Friday June 27th at 11:00am        Gareld June, BSN, RN [place]  VBCI - Population Health RN Care Manager [phone]

## 2024-01-16 NOTE — Patient Instructions (Signed)
 Visit Information  Thank you for taking time to visit with me today. Please don't hesitate to contact me if I can be of assistance to you before our next scheduled telephone appointment.  Our next appointment is by telephone on Friday June 27th at 11:00am  Following is a copy of your care plan:   Goals Addressed             This Visit's Progress    VBCI Transitions of Care (TOC) Care Plan       Problems:  Recent Hospitalization for treatment of Lung cancer with brain mets Knowledge Deficit Related to Confusion related to progressive lung cancer and metastasis to the brain  Goal:  Over the next 30 days, the patient will not experience hospital readmission  Interventions:   Oncology: Assessment of understanding of oncology diagnosis:  Assessed patient understanding of cancer diagnosis and recommended treatment plan Reviewed upcoming provider appointments and treatment appointments Assessed available transportation to appointments and treatments. Has consistent/reliable transportation: Yes Assessed support system. Has consistent/reliable family or other support: Yes Nutrition assessment performed  Patient Self Care Activities:  Attend all scheduled provider appointments Call pharmacy for medication refills 3-7 days in advance of running out of medications Call provider office for new concerns or questions  Notify RN Care Manager of [facility] call rescheduling needs Participate in Transition of Care Program/Attend [facility] scheduled calls Perform all self care activities independently  Take medications as prescribed    Plan:  Telephone follow up appointment with care management team member scheduled for:  Friday June 27th at 11:00am        Patient verbalizes understanding of instructions and care plan provided today and agrees to view in MyChart. Active MyChart status and patient understanding of how to access instructions and care plan via MyChart confirmed with patient.     The  patient has been provided with contact information for the care management team and has been advised to call with any health related questions or concerns.   Please call the care guide team at [phone] if you need to cancel or reschedule your appointment.   Please call the Suicide and Crisis Lifeline: 988 call the [place]  National Suicide Prevention Lifeline: (912)737-3355 or TTY: 517-147-9005 TTY 262-059-8853) to talk to a trained counselor if you are experiencing a Mental Health or Behavioral Health Crisis or need someone to talk to.  Gareld June, BSN, RN   VBCI - [employer] [facility] RN Care Manager [phone]

## 2024-01-19 DIAGNOSIS — Z992 Dependence on renal dialysis: Secondary | ICD-10-CM | POA: Diagnosis not present

## 2024-01-19 DIAGNOSIS — N2581 Secondary hyperparathyroidism of renal origin: Secondary | ICD-10-CM | POA: Diagnosis not present

## 2024-01-19 DIAGNOSIS — E1122 Type 2 diabetes mellitus with diabetic chronic kidney disease: Secondary | ICD-10-CM | POA: Diagnosis not present

## 2024-01-19 DIAGNOSIS — N186 End stage renal disease: Secondary | ICD-10-CM | POA: Diagnosis not present

## 2024-01-20 ENCOUNTER — Other Ambulatory Visit: Payer: Self-pay | Admitting: Student

## 2024-01-20 NOTE — Telephone Encounter (Signed)
 NO LONGER [facility] PATIENT

## 2024-01-21 DIAGNOSIS — N186 End stage renal disease: Secondary | ICD-10-CM | POA: Diagnosis not present

## 2024-01-21 DIAGNOSIS — N2581 Secondary hyperparathyroidism of renal origin: Secondary | ICD-10-CM | POA: Diagnosis not present

## 2024-01-21 DIAGNOSIS — E1122 Type 2 diabetes mellitus with diabetic chronic kidney disease: Secondary | ICD-10-CM | POA: Diagnosis not present

## 2024-01-21 DIAGNOSIS — Z992 Dependence on renal dialysis: Secondary | ICD-10-CM | POA: Diagnosis not present

## 2024-01-22 ENCOUNTER — Encounter: Payer: Self-pay | Admitting: Hematology & Oncology

## 2024-01-22 ENCOUNTER — Inpatient Hospital Stay

## 2024-01-22 ENCOUNTER — Other Ambulatory Visit: Payer: Self-pay

## 2024-01-22 ENCOUNTER — Inpatient Hospital Stay (HOSPITAL_BASED_OUTPATIENT_CLINIC_OR_DEPARTMENT_OTHER): Admitting: Hematology & Oncology

## 2024-01-22 VITALS — BP 141/44 | HR 87 | Temp 100.0°F | Resp 16 | Ht 67.0 in | Wt 129.0 lb

## 2024-01-22 DIAGNOSIS — C3491 Malignant neoplasm of unspecified part of right bronchus or lung: Secondary | ICD-10-CM | POA: Diagnosis not present

## 2024-01-22 DIAGNOSIS — Z79620 Long term (current) use of immunosuppressive biologic: Secondary | ICD-10-CM | POA: Diagnosis not present

## 2024-01-22 DIAGNOSIS — Z992 Dependence on renal dialysis: Secondary | ICD-10-CM | POA: Diagnosis not present

## 2024-01-22 DIAGNOSIS — D631 Anemia in chronic kidney disease: Secondary | ICD-10-CM | POA: Diagnosis not present

## 2024-01-22 DIAGNOSIS — Z5112 Encounter for antineoplastic immunotherapy: Secondary | ICD-10-CM | POA: Diagnosis not present

## 2024-01-22 DIAGNOSIS — C3411 Malignant neoplasm of upper lobe, right bronchus or lung: Secondary | ICD-10-CM

## 2024-01-22 DIAGNOSIS — N186 End stage renal disease: Secondary | ICD-10-CM | POA: Diagnosis not present

## 2024-01-22 DIAGNOSIS — J90 Pleural effusion, not elsewhere classified: Secondary | ICD-10-CM | POA: Diagnosis not present

## 2024-01-22 LAB — CMP (CANCER CENTER ONLY)
ALT: 11 U/L (ref 0–44)
AST: 8 U/L — ABNORMAL LOW (ref 15–41)
Albumin: 3.7 g/dL (ref 3.5–5.0)
Alkaline Phosphatase: 85 U/L (ref 38–126)
Anion gap: 12 (ref 5–15)
BUN: 43 mg/dL — ABNORMAL HIGH (ref 8–23)
CO2: 27 mmol/L (ref 22–32)
Calcium: 8.6 mg/dL — ABNORMAL LOW (ref 8.9–10.3)
Chloride: 101 mmol/L (ref 98–111)
Creatinine: 5.9 mg/dL — ABNORMAL HIGH (ref 0.44–1.00)
GFR, Estimated: 8 mL/min — ABNORMAL LOW (ref >60)
Glucose, Bld: 219 mg/dL — ABNORMAL HIGH (ref 70–99)
Potassium: 5 mmol/L (ref 3.5–5.1)
Sodium: 140 mmol/L (ref 135–145)
Total Bilirubin: 0.3 mg/dL (ref 0.0–1.2)
Total Protein: 6.2 g/dL — ABNORMAL LOW (ref 6.5–8.1)

## 2024-01-22 LAB — CBC WITH DIFFERENTIAL (CANCER CENTER ONLY)
Abs Immature Granulocytes: 0.02 10*3/uL (ref 0.00–0.07)
Basophils Absolute: 0 10*3/uL (ref 0.0–0.1)
Basophils Relative: 0 %
Eosinophils Absolute: 0.1 10*3/uL (ref 0.0–0.5)
Eosinophils Relative: 3 %
HCT: 32.3 % — ABNORMAL LOW (ref 36.0–46.0)
Hemoglobin: 10.2 g/dL — ABNORMAL LOW (ref 12.0–15.0)
Immature Granulocytes: 0 %
Lymphocytes Relative: 16 %
Lymphs Abs: 0.9 10*3/uL (ref 0.7–4.0)
MCH: 30.4 pg (ref 26.0–34.0)
MCHC: 31.6 g/dL (ref 30.0–36.0)
MCV: 96.4 fL (ref 80.0–100.0)
Monocytes Absolute: 0.4 10*3/uL (ref 0.1–1.0)
Monocytes Relative: 8 %
Neutro Abs: 4.1 10*3/uL (ref 1.7–7.7)
Neutrophils Relative %: 73 %
Platelet Count: 205 10*3/uL (ref 150–400)
RBC: 3.35 MIL/uL — ABNORMAL LOW (ref 3.87–5.11)
RDW: 18 % — ABNORMAL HIGH (ref 11.5–15.5)
WBC Count: 5.6 10*3/uL (ref 4.0–10.5)
nRBC: 0 % (ref 0.0–0.2)

## 2024-01-22 LAB — LACTATE DEHYDROGENASE: LDH: 205 U/L — ABNORMAL HIGH (ref 98–192)

## 2024-01-22 LAB — SAMPLE TO BLOOD BANK

## 2024-01-22 LAB — TSH: TSH: 0.434 u[IU]/mL (ref 0.350–4.500)

## 2024-01-22 NOTE — Progress Notes (Signed)
 Hematology and Oncology Follow Up Visit  Amber Cross [id number] 08/30/1960 63 y.o. 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   Prior Therapy:            Status post radiosurgery-completed on 01/31/2022 Carboplatinum/etoposide /Tecentriq  --s/p cycle #4 -- start on 05/07/2023 CNS radiotherapy-to be done at [facility] regional   Current Therapy: Tecentriq  1200 mg IV q 3 weeks - maintenance -- start on 09/23/2023  Aranesp  300 mcg subcu every 3 weeks for hemoglobin less than 11   Interim History:  Amber Cross is here today for follow-up and treatment.  Unfortunately, she apparently was hospitalized at [facility].  She was there recently.  She was at dialysis and apparently had a syncopal episode.  She did not have a seizure.  She was taken to [facility].  She actually had an MRI of the brain done which showed at least 6 lesions in the brain.  The largest was 8 mm.  There was no mass effect or edema.  She is not sure how long she was in the hospital.  She is not sure what exactly happened.  I did speak with Dr. Glennda Radiation Oncology.  She will be more than happy to see Amber Cross and get whole brain radiation going.  Otherwise, she is doing fairly well.  She is not happy about her dialysis.  But she needs dialysis 3 times a week.  She has had no bleeding.  She has had no nausea or vomiting.  She has had a little bit of leg swelling.  There is been no visual changes.  She has had no headache.  She has had no swallowing problems.  There is been no speech issues.  Currently, I would have to say that her performance status is probably ECOG 2.   Medications:  Allergies as of 01/22/2024       Reactions   Penicillins Itching, Other (See Comments)   redness   Strawberry (diagnostic) Itching, Swelling   Tomato Itching, Swelling         Medication List        Accurate as of January 22, 2024  1:10 PM. If you have any questions, ask your nurse or doctor.          Adjustable Lancing Device Misc   amLODipine  10 MG tablet Commonly known as: NORVASC  Take 10 mg by mouth daily.   amlodipine -olmesartan  10-20 MG tablet Commonly known as: AZOR  Take 1 tablet by mouth at bedtime.   atorvastatin  80 MG tablet Commonly known as: LIPITOR  Take 1 tablet (80 mg total) by mouth daily.   Calcium  Acetate 668 (169 Ca) MG Tabs Take 1 tablet by mouth 3 (three) times daily.   dronabinol  2.5 MG capsule Commonly known as: MARINOL  Take 1 capsule (2.5 mg total) by mouth 2 (two) times daily before a meal.   fluticasone  50 MCG/ACT nasal spray Commonly known as: FLONASE  SPRAY 2 SPRAYS INTO EACH NOSTRIL EVERY DAY   furosemide  40 MG tablet Commonly known as: Lasix  Take 1 tablet (40 mg total) by mouth daily as needed (swelling in legs or weight gain).   gabapentin  600 MG tablet Commonly known as: NEURONTIN  Take 600 mg by mouth daily.   HECTOROL  IV Take 3 mcg by mouth 3 (three) times a week.   hydrALAZINE  100 MG tablet Commonly known as: APRESOLINE  Take 1 tablet (100 mg total) by mouth 2 (  two) times daily.   megestrol  625 MG/5ML suspension Commonly known as: MEGACE  ES Take 5 mLs (625 mg total) by mouth daily.   pantoprazole  40 MG tablet Commonly known as: PROTONIX  Take 40 mg by mouth daily.   prednisoLONE acetate 1 % ophthalmic suspension Commonly known as: PRED FORTE Place 1 drop into the left eye 4 (four) times daily.   sevelamer  carbonate 800 MG tablet Commonly known as: RENVELA  Take 800 mg by mouth 3 (three) times daily with meals.   traZODone  50 MG tablet Commonly known as: DESYREL  Take 1 tablet (50 mg total) by mouth at bedtime.        Allergies:  Allergies  Allergen Reactions   Penicillins Itching and Other (See Comments)    redness   Strawberry (Diagnostic) Itching and Swelling   Tomato Itching and  Swelling    Past Medical History, Surgical history, Social history, and Family History were reviewed and updated.  Review of Systems: Review of Systems  Constitutional:  Positive for malaise/fatigue.  HENT: Negative.    Eyes: Negative.   Respiratory:  Positive for shortness of breath.   Cardiovascular:  Positive for palpitations.  Gastrointestinal:  Positive for heartburn and nausea.  Genitourinary: Negative.   Musculoskeletal: Negative.   Skin: Negative.   Neurological: Negative.   Endo/Heme/Allergies: Negative.   Psychiatric/Behavioral: Negative.    All other systems reviewed and are negative.    Physical Exam:  height is 5' 7 (1.702 m) and weight is 129 lb (58.5 kg). Her oral temperature is 100 F (37.8 C). Her blood pressure is 141/44 (abnormal) and her pulse is 87. Her respiration is 16 and oxygen saturation is 94%.   Wt Readings from Last 3 Encounters:  01/22/24 129 lb (58.5 kg)  01/16/24 124 lb (56.2 kg)  01/06/24 124 lb (56.2 kg)    Physical Exam Vitals reviewed.  HENT:     Head: Normocephalic and atraumatic.   Eyes:     Pupils: Pupils are equal, round, and reactive to light.    Cardiovascular:     Rate and Rhythm: Normal rate and regular rhythm.     Heart sounds: Normal heart sounds.  Pulmonary:     Effort: Pulmonary effort is normal.     Breath sounds: Normal breath sounds.  Abdominal:     General: Bowel sounds are normal.     Palpations: Abdomen is soft.   Musculoskeletal:        General: No tenderness or deformity. Normal range of motion.     Cervical back: Normal range of motion.  Lymphadenopathy:     Cervical: No cervical adenopathy.   Skin:    General: Skin is warm and dry.     Findings: No erythema or rash.   Neurological:     Mental Status: She is alert and oriented to person, place, and time.   Psychiatric:        Behavior: Behavior normal.        Thought Content: Thought content normal.        Judgment: Judgment normal.      Lab Results  Component Value Date   WBC 5.6 01/22/2024   HGB 10.2 (L) 01/22/2024   HCT 32.3 (L) 01/22/2024   MCV 96.4 01/22/2024   PLT 205 01/22/2024   Lab Results  Component Value Date   FERRITIN 1,212 (H) 11/25/2023   IRON 119 11/25/2023   TIBC 281 11/25/2023   UIBC 162 11/25/2023   IRONPCTSAT 42 (H) 11/25/2023   Lab Results  Component Value Date   RETICCTPCT 1.9 01/08/2023   RBC 3.35 (L) 01/22/2024   Lab Results  Component Value Date   KPAFRELGTCHN 123.1 (H) 01/31/2022   LAMBDASER 36.3 (H) 01/31/2022   KAPLAMBRATIO 3.39 (H) 01/31/2022   Lab Results  Component Value Date   IGGSERUM 768 05/09/2021   IGA 150 05/09/2021   IGMSERUM 60 05/09/2021   Lab Results  Component Value Date   TOTALPROTELP 5.3 (L) 01/31/2022   ALBUMINELP 1.8 (L) 01/31/2022   A1GS 0.4 01/31/2022   A2GS 1.3 (H) 01/31/2022   BETS 1.0 01/31/2022   GAMS 0.8 01/31/2022   MSPIKE Not Observed 01/31/2022   SPEI Comment 01/31/2022     Chemistry      Component Value Date/Time   NA 140 01/06/2024 0950   NA 141 03/11/2023 1220   K 5.5 (H) 01/06/2024 0950   CL 100 01/06/2024 0950   CO2 26 01/06/2024 0950   BUN 41 (H) 01/06/2024 0950   BUN 26 03/11/2023 1220   CREATININE 5.08 (H) 01/06/2024 0950      Component Value Date/Time   CALCIUM  9.1 01/06/2024 0950   ALKPHOS 110 01/06/2024 0950   AST 14 (L) 01/06/2024 0950   ALT 14 01/06/2024 0950   BILITOT 0.3 01/06/2024 0950       Impression and Plan: Amber Cross is a very nice 63 year old African-American female.  She has history of limited stage small cell lung cancer in addition to ESRD.  Unfortunately, because of her overall health status, she really could not have combined radiation therapy and chemotherapy.   I really hate that she had the CNS metastasis.  I still think that we need to move ahead with her immunotherapy.  I know that radiation therapy will be able to help with the CNS disease.  Her last PET scan was done back in March.   We really need to get her set up with another 1.  I will see about trying get another one set up for her.  She really looks quite good.  She does have high blood sugars.  We can have to watch her blood sugars.  She would have to be on some steroid.  I will have to get her on low-dose Decadron .  We will plan to see her back in 3 to 4 weeks.    She did have a pleural effusion on the last PET scan.  However, she had a thoracentesis which did not show any evidence of malignancy.  Overall, her quality life clearly is based upon her dialysis.  Surprising, she is not anemic.  We will not have to do a ESA on her today.  I think this is fortunate because her blood pressure is on the higher side.  Again, this is all about quality of life.  Will have her plan to come back to see us  in 3 weeks.  I do not think we have to do another PET scan on her probably until July.    Amber JONELLE Crease, MD 6/26/20251:10 PM Right

## 2024-01-23 ENCOUNTER — Encounter: Payer: Self-pay | Admitting: Hematology & Oncology

## 2024-01-23 ENCOUNTER — Telehealth: Payer: Self-pay | Admitting: *Deleted

## 2024-01-23 ENCOUNTER — Other Ambulatory Visit: Payer: Self-pay

## 2024-01-23 DIAGNOSIS — N2581 Secondary hyperparathyroidism of renal origin: Secondary | ICD-10-CM | POA: Diagnosis not present

## 2024-01-23 DIAGNOSIS — Z992 Dependence on renal dialysis: Secondary | ICD-10-CM | POA: Diagnosis not present

## 2024-01-23 DIAGNOSIS — N186 End stage renal disease: Secondary | ICD-10-CM | POA: Diagnosis not present

## 2024-01-23 DIAGNOSIS — E1122 Type 2 diabetes mellitus with diabetic chronic kidney disease: Secondary | ICD-10-CM | POA: Diagnosis not present

## 2024-01-23 NOTE — Transitions of Care (Post Inpatient/ED Visit) (Signed)
 Transition of Care week 2  Visit Note  01/23/2024  Name: Amber Cross MRN: [id number]          DOB: [birth year]  Situation: Patient enrolled in [facility] 30-day program. Visit completed with [name] by telephone.   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   Postop check 08/05/2022   Pseudogout    Stroke (HCC) 10/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   Tamponade 06/18/2022   Tobacco abuse     Assessment: Patient Reported Symptoms: Cognitive Cognitive Status: Alert and oriented to person, place, and time, Difficulties with attention and concentration, Struggling with memory recall Cognitive/Intellectual Conditions Management [RPT]: Other Other: Brain Metastasis      Neurological Neurological Review of Symptoms: Numbness Neurological Management Strategies: Medication therapy Neurological Self-Management Outcome: 4 (good)  HEENT HEENT Symptoms Reported: No symptoms reported      Cardiovascular Cardiovascular Symptoms Reported: Swelling in legs or feet Does patient have uncontrolled Hypertension?: No (BP varies due to dialysis) Cardiovascular Conditions: Hypertension, Heart failure Cardiovascular Management Strategies: Medication therapy, Weight management Do You Have a Working Readable Scale?: Yes Weight: 129 lb (58.5 kg) Cardiovascular Self-Management Outcome: 4 (good) Cardiovascular Comment: The patient weighs at HD sessions. She has PRN Lasix  for additional swelling  Respiratory Respiratory Symptoms Reported: No symptoms reported     Endocrine Patient reports the following symptoms related to hypoglycemia or hyperglycemia : No symptoms reported Is patient diabetic?: Yes Is patient checking blood sugars at home?: No Endocrine Conditions: Diabetes Endocrine Management Strategies: Medication therapy Endocrine Self-Management Outcome: 4 (good) Endocrine Comment: The patient does not take insulin   Gastrointestinal Gastrointestinal Symptoms Reported: No symptoms reported   Nutrition Risk Screen (CP): Reduced oral intake over the last month, Unintentional loss of 10 lbs or more in the past 2 months (The patient is not taking Marinol  or Megace )  Genitourinary Genitourinary Symptoms Reported: Other Other Genitourinary Symptoms: Dialysis Genitourinary Conditions: End-stage renal disease Genitourinary Management Strategies: Hemodialysis Hemodialysis Schedule: Monday, Wednesday and Friday Hemodialysis Last Treatment: 01/23/24 Genitourinary Self-Management Outcome: 4 (good)  Integumentary Integumentary Symptoms Reported: No symptoms reported Additional Integumentary Details: Dialysis access Skin Self-Management Outcome: 4 (good)  Musculoskeletal Musculoskelatal Symptoms Reviewed: Weakness Musculoskeletal Management Strategies: Adequate rest Musculoskeletal Self-Management Outcome: 4 (good) Falls in the past year?: No Number of falls in past year: 1 or less Was there an injury with Fall?: No Fall Risk Category Calculator: 0 Patient Fall Risk Level: Low Fall Risk Fall risk Follow up: Falls evaluation completed  Psychosocial Psychosocial Symptoms Reported: No symptoms reported         There were no vitals filed for this visit.  Medications Reviewed Today     Reviewed by Moises Reusing, RN (Case Manager) on 01/23/24 at 1115  Med List Status: <None>   Medication Order Taking? Sig Documenting Provider Last Dose Status Informant  amLODipine  (NORVASC ) 10 MG tablet [id number] Yes Take 10 mg by mouth daily. [provider]  Active   amlodipine -olmesartan  (  AZOR ) 10-20 MG tablet [id number] Yes Take 1 tablet by mouth at bedtime. Cyndi Shaver, PA-C  Active   atorvastatin  (LIPITOR ) 80 MG tablet [id number] Yes Take 1 tablet (80 mg total) by mouth daily. Amoako, Prince, MD  Active   Calcium  Acetate 668 (169 Ca) MG TABS [id number] Yes Take 1 tablet by mouth 3 (three) times daily. [provider]  Active   Doxercalciferol  (HECTOROL  IV) [id number] Yes Take 3 mcg by mouth 3 (three) times a week. [provider]  Active   dronabinol  (MARINOL ) 2.5 MG capsule [id number]  Take 1 capsule (2.5 mg total) by mouth 2 (two) times daily before a meal.  Patient not taking: Reported on 01/23/2024   Ennever, Peter R, MD  Active   fluticasone  (FLONASE ) 50 MCG/ACT nasal spray [id number] Yes SPRAY 2 SPRAYS INTO EACH NOSTRIL EVERY DAY Cyndi Shaver, PA-C  Active   furosemide  (LASIX ) 40 MG tablet [id number] Yes Take 1 tablet (40 mg total) by mouth daily as needed (swelling in legs or weight gain). Jinwala, Sagar H, MD  Active            Med Note [name] [place] Feb 13, 2023  2:55 PM) 02/13/2023 Takes on non-dialysis days.  gabapentin  (NEURONTIN ) 600 MG tablet [id number] Yes Take 600 mg by mouth daily. [provider]  Active   hydrALAZINE  (APRESOLINE ) 100 MG tablet [id number] Yes Take 1 tablet (100 mg total) by mouth 2 (two) times daily. Cyndi Shaver, PA-C  Active   Lancet Devices (ADJUSTABLE LANCING DEVICE) MISC [id number] Yes  [provider]  Active   megestrol  (MEGACE  ES) 625 MG/5ML suspension [id number]  Take 5 mLs (625 mg total) by mouth daily.  Patient not taking: Reported on 01/23/2024   Timmy Maude SAUNDERS, MD  Active   pantoprazole  (PROTONIX ) 40 MG tablet [id number] Yes Take 40 mg by mouth daily. [provider]  Active   prednisoLONE acetate (PRED FORTE) 1 % ophthalmic suspension [id number]  Place 1 drop into the left eye 4 (four) times daily.  Patient not taking: Reported on  01/23/2024   [provider]  Active   sevelamer  carbonate (RENVELA ) 800 MG tablet [id number] Yes Take 800 mg by mouth 3 (three) times daily with meals. [provider]  Active   traZODone  (DESYREL ) 50 MG tablet [id number] Yes Take 1 tablet (50 mg total) by mouth at bedtime. Renne Homans, MD  Active             Recommendation:   Continue Current Plan of Care  Follow Up Plan:   Telephone follow-up in 1 week  Medford Balboa, BSN, RN [place]  VBCI - [facility] RN Care Manager [phone]

## 2024-01-23 NOTE — Telephone Encounter (Signed)
 PET scan routed to [facility] per dr Timmy request

## 2024-01-23 NOTE — Patient Instructions (Signed)
 Visit Information  Thank you for taking time to visit with me today. Please don't hesitate to contact me if I can be of assistance to you before our next scheduled telephone appointment.  Our next appointment is by telephone on Wednesday July 2nd at 10:00am  Following is a copy of your care plan:   Goals Addressed             This Visit's Progress    VBCI Transitions of Care (TOC) Care Plan       Problems: ( reviewed 01/23/24) Recent Hospitalization for treatment of Lung cancer with brain mets Knowledge Deficit Related to Confusion related to progressive lung cancer and metastasis to the brain  Goal: ( reviewed 01/23/24) Over the next 30 days, the patient will not experience hospital readmission  Interventions: ( reviewed 01/23/24)  Oncology: Assessment of understanding of oncology diagnosis:  Assessed patient understanding of cancer diagnosis and recommended treatment plan Reviewed upcoming provider appointments and treatment appointments Assessed available transportation to appointments and treatments. Has consistent/reliable transportation: Yes Assessed support system. Has consistent/reliable family or other support: Yes Nutrition assessment performed Start XRT to the brain to relieve brain mets Follow up with providers as scheduled  Patient Self Care Activities:  Attend all scheduled provider appointments Call pharmacy for medication refills 3-7 days in advance of running out of medications Call provider office for new concerns or questions  Notify RN Care Manager of [facility] call rescheduling needs Participate in Transition of Care Program/Attend [facility] scheduled calls Perform all self care activities independently  Take medications as prescribed    Plan:  Telephone follow up appointment with care management team member scheduled for:  Wednesday July 2nd at 10:00am        Patient verbalizes understanding of instructions and care plan provided today and agrees to view in  MyChart. Active MyChart status and patient understanding of how to access instructions and care plan via MyChart confirmed with patient.     The patient has been provided with contact information for the care management team and has been advised to call with any health related questions or concerns.   Please call the care guide team at [phone] if you need to cancel or reschedule your appointment.   Please call the Suicide and Crisis Lifeline: 988 call the [place]  National Suicide Prevention Lifeline: 615-781-1137 or TTY: (346)718-0729 TTY (972)424-3675) to talk to a trained counselor if you are experiencing a Mental Health or Behavioral Health Crisis or need someone to talk to.  Medford Balboa, BSN, RN [place]  VBCI - [employer] [facility] RN Care Manager [phone]

## 2024-01-26 DIAGNOSIS — N186 End stage renal disease: Secondary | ICD-10-CM | POA: Diagnosis not present

## 2024-01-26 DIAGNOSIS — E1122 Type 2 diabetes mellitus with diabetic chronic kidney disease: Secondary | ICD-10-CM | POA: Diagnosis not present

## 2024-01-26 DIAGNOSIS — Z992 Dependence on renal dialysis: Secondary | ICD-10-CM | POA: Diagnosis not present

## 2024-01-26 DIAGNOSIS — I129 Hypertensive chronic kidney disease with stage 1 through stage 4 chronic kidney disease, or unspecified chronic kidney disease: Secondary | ICD-10-CM | POA: Diagnosis not present

## 2024-01-26 DIAGNOSIS — N2581 Secondary hyperparathyroidism of renal origin: Secondary | ICD-10-CM | POA: Diagnosis not present

## 2024-01-27 ENCOUNTER — Inpatient Hospital Stay: Admitting: Family

## 2024-01-27 ENCOUNTER — Encounter: Payer: Self-pay | Admitting: Hematology & Oncology

## 2024-01-27 ENCOUNTER — Inpatient Hospital Stay: Admitting: Physician Assistant

## 2024-01-27 ENCOUNTER — Inpatient Hospital Stay

## 2024-01-27 ENCOUNTER — Inpatient Hospital Stay: Attending: Radiation Oncology

## 2024-01-27 DIAGNOSIS — Z510 Encounter for antineoplastic radiation therapy: Secondary | ICD-10-CM | POA: Diagnosis not present

## 2024-01-27 DIAGNOSIS — C3490 Malignant neoplasm of unspecified part of unspecified bronchus or lung: Secondary | ICD-10-CM | POA: Diagnosis not present

## 2024-01-27 DIAGNOSIS — C7931 Secondary malignant neoplasm of brain: Secondary | ICD-10-CM | POA: Diagnosis not present

## 2024-01-27 NOTE — Progress Notes (Deleted)
      Established patient visit   Patient: Amber Cross   DOB: [birth year]   63 y.o. Female  MRN: [id number] Visit Date: 01/27/2024  Today's healthcare provider: Manuelita Flatness, PA-C   Cc. Hospital follow up  Subjective     Pt presents for a hospital follow up . She has a PMH of small cell lung cancer, CHF, ESRD on dialysis, DM2 She was admitted 6/16-6/19 with AMS, 2/2 toxic encephalopathy d/t multiple psychotropic medications. On brain imaging, small mets were identified.  Mental status did not improve to baseline by discharge. It was recommended d/c to SNF, family refused and pt d/c home.   She has since f/b with med oncology, who referred to rad onc, upcoming appointment 02/09/24.  Glucose reviewed on labs has been elevated > 200 pt does not check at home. Last A1c 4/24 was 7.0% and given medical complexity, and pt's mental status, decision was made to forgo meds and just monitor.   Medications: Outpatient Medications Prior to Visit  Medication Sig   amLODipine  (NORVASC ) 10 MG tablet Take 10 mg by mouth daily.   amlodipine -olmesartan  (AZOR ) 10-20 MG tablet Take 1 tablet by mouth at bedtime.   atorvastatin  (LIPITOR ) 80 MG tablet Take 1 tablet (80 mg total) by mouth daily.   Calcium  Acetate 668 (169 Ca) MG TABS Take 1 tablet by mouth 3 (three) times daily.   Doxercalciferol  (HECTOROL  IV) Take 3 mcg by mouth 3 (three) times a week.   dronabinol  (MARINOL ) 2.5 MG capsule Take 1 capsule (2.5 mg total) by mouth 2 (two) times daily before a meal. (Patient not taking: Reported on 01/23/2024)   fluticasone  (FLONASE ) 50 MCG/ACT nasal spray SPRAY 2 SPRAYS INTO EACH NOSTRIL EVERY DAY   furosemide  (LASIX ) 40 MG tablet Take 1 tablet (40 mg total) by mouth daily as needed (swelling in legs or weight gain).   gabapentin  (NEURONTIN ) 600 MG tablet Take 600 mg by mouth daily.   hydrALAZINE  (APRESOLINE ) 100 MG tablet Take 1 tablet (100 mg total) by mouth 2 (two) times daily.   Lancet Devices  (ADJUSTABLE LANCING DEVICE) MISC    megestrol  (MEGACE  ES) 625 MG/5ML suspension Take 5 mLs (625 mg total) by mouth daily. (Patient not taking: Reported on 01/23/2024)   pantoprazole  (PROTONIX ) 40 MG tablet Take 40 mg by mouth daily.   prednisoLONE acetate (PRED FORTE) 1 % ophthalmic suspension Place 1 drop into the left eye 4 (four) times daily. (Patient not taking: Reported on 01/23/2024)   sevelamer  carbonate (RENVELA ) 800 MG tablet Take 800 mg by mouth 3 (three) times daily with meals.   traZODone  (DESYREL ) 50 MG tablet Take 1 tablet (50 mg total) by mouth at bedtime.   No facility-administered medications prior to visit.    Review of Systems {Insert previous labs (optional):23779} {See past labs  Heme  Chem  Endocrine  Serology  Results Review (optional):1}   Objective    LMP 09/29/2011  {Insert last BP/Wt (optional):23777}{See vitals history (optional):1}  Physical Exam  ***  No results found for any visits on 01/27/24.  Assessment & Plan    There are no diagnoses linked to this encounter.  ***  No follow-ups on file.       Manuelita Flatness, PA-C  [facility] [phone] (phone) [phone] (fax)  [facility]

## 2024-01-27 NOTE — Progress Notes (Signed)
 4P's Prior radiation: yes, [facility] Jan 2023 Rt lung  Implanted device: no  Pacemaker: no  Pregnant: no  Diabetic: yes, DM 2 Patient denies known history of collagen vascular disease or interstitial lung disease.   4P's were reviewed. Teaching provided regarding RT including education packet provided to patient. Contact information also provided for Dr. Blake.  Consent signed.  Electronically signed by: Dionne Kearse, RN 01/27/2024 1:58 PM

## 2024-01-28 ENCOUNTER — Other Ambulatory Visit: Payer: Self-pay

## 2024-01-28 DIAGNOSIS — Z992 Dependence on renal dialysis: Secondary | ICD-10-CM | POA: Diagnosis not present

## 2024-01-28 DIAGNOSIS — N186 End stage renal disease: Secondary | ICD-10-CM | POA: Diagnosis not present

## 2024-01-28 DIAGNOSIS — E1122 Type 2 diabetes mellitus with diabetic chronic kidney disease: Secondary | ICD-10-CM | POA: Diagnosis not present

## 2024-01-28 DIAGNOSIS — N2581 Secondary hyperparathyroidism of renal origin: Secondary | ICD-10-CM | POA: Diagnosis not present

## 2024-01-28 NOTE — Transitions of Care (Post Inpatient/ED Visit) (Signed)
 Transition of Care week 3  Visit Note  01/28/2024  Name: Amber Cross MRN: [id number]          DOB: 11-26-1960  Situation: Patient enrolled in [facility] 30-day program. Visit completed with [name] by telephone.   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   Postop check 08/05/2022   Pseudogout    Stroke (HCC) 10/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   Tamponade 06/18/2022   Tobacco abuse     Assessment: Patient Reported Symptoms: Cognitive Cognitive Status: No symptoms reported, Difficulties with attention and concentration, Struggling with memory recall Other: Metastatic Lung Cancer with Brain Mets      Neurological Neurological Review of Symptoms: No symptoms reported    HEENT HEENT Symptoms Reported: No symptoms reported      Cardiovascular Cardiovascular Symptoms Reported: Swelling in legs or feet Does patient have uncontrolled Hypertension?: No (Depends on how much fluid is pulled off during dialysis) Cardiovascular Management Strategies: Medication therapy, Weight management Do You Have a Working Readable Scale?: Yes Weight: 129 lb (58.5 kg) Cardiovascular Comment: The patient is weighed at dialysis  Respiratory Respiratory Symptoms Reported: No symptoms reported    Endocrine Endocrine Symptoms Reported: No symptoms reported Is patient diabetic?: Yes Is patient checking blood sugars at home?: No    Gastrointestinal Gastrointestinal Symptoms Reported: No symptoms reported   Nutrition Risk Screen (CP): Reduced oral intake over  the last month, Unintentional loss of 10 lbs or more in the past 2 months  Genitourinary Genitourinary Symptoms Reported: No symptoms reported Other Genitourinary Symptoms: Dialysis Genitourinary Management Strategies: Hemodialysis Hemodialysis Schedule: Monday,Wednesday and Friday Hemodialysis Last Treatment: 01/28/24 Genitourinary Self-Management Outcome: 4 (good)  Integumentary Integumentary Symptoms Reported: No symptoms reported, Wound Additional Integumentary Details: Dialysis access Skin Management Strategies: Routine screening Skin Self-Management Outcome: 4 (good)  Musculoskeletal Musculoskelatal Symptoms Reviewed: Weakness Musculoskeletal Management Strategies: Adequate rest Musculoskeletal Self-Management Outcome: 4 (good)      Psychosocial Psychosocial Symptoms Reported: No symptoms reported         There were no vitals filed for this visit.  Medications Reviewed Today     Reviewed by Moises Reusing, RN (Case Manager) on 01/28/24 at 1533  Med List Status: <None>   Medication Order Taking? Sig Documenting Provider Last Dose Status Informant  amLODipine  (NORVASC ) 10 MG tablet [id number]  Take 10 mg by mouth daily. [provider]  Active   amlodipine -olmesartan  (AZOR ) 10-20 MG tablet [id number]  Take 1 tablet by mouth at bedtime. Cyndi Shaver, PA-C  Active   atorvastatin  (LIPITOR ) 80 MG tablet [id number]  Take 1 tablet (80 mg total) by mouth daily. Amoako, Prince, MD  Active   Calcium  Acetate 668 (169 Ca) MG TABS [id number]  Take 1 tablet by mouth 3 (three) times daily. [provider]  Active   Doxercalciferol  (HECTOROL  IV) [id number]  Take 3 mcg by mouth 3 (three) times a week. [provider]  Active   dronabinol  (MARINOL ) 2.5 MG capsule [id number]  Take 1 capsule (2.5 mg total) by mouth 2 (two) times daily before a meal.  Patient not taking: Reported on 01/23/2024   Ennever, Peter R, MD  Active   fluticasone  (FLONASE ) 50 MCG/ACT nasal spray  [id number]  SPRAY 2 SPRAYS INTO EACH NOSTRIL EVERY DAY Cyndi Shaver, PA-C  Active   furosemide  (LASIX ) 40 MG tablet [id number]  Take 1 tablet (40 mg total) by mouth daily as needed (swelling in legs or weight gain). Emmy Justus DEL, MD  Expired 01/23/24 2359            Med Note [name] [place] Feb 13, 2023  2:55 PM) 02/13/2023 Takes on non-dialysis days.  gabapentin  (NEURONTIN ) 600 MG tablet [id number]  Take 600 mg by mouth daily. [provider]  Active   hydrALAZINE  (APRESOLINE ) 100 MG tablet [id number]  Take 1 tablet (100 mg total) by mouth 2 (two) times daily. Cyndi Shaver, PA-C  Active   Lancet Devices (ADJUSTABLE LANCING DEVICE) MISC [id number]   [provider]  Active   megestrol  (MEGACE  ES) 625 MG/5ML suspension [id number]  Take 5 mLs (625 mg total) by mouth daily.  Patient not taking: Reported on 01/23/2024   Timmy Maude SAUNDERS, MD  Active   pantoprazole  (PROTONIX ) 40 MG tablet [id number]  Take 40 mg by mouth daily. [provider]  Active   prednisoLONE acetate (PRED FORTE) 1 % ophthalmic suspension [id number]  Place 1 drop into the left eye 4 (four) times daily.  Patient not taking: Reported on 01/23/2024   [provider]  Active   sevelamer  carbonate (RENVELA ) 800 MG tablet [id number]  Take 800 mg by mouth 3 (three) times daily with meals. [provider]  Active   traZODone  (DESYREL ) 50 MG tablet [id number]  Take 1 tablet (50 mg total) by mouth at bedtime. Renne Homans, MD  Active             Recommendation:   PCP Follow-up Continue Current Plan of Care  Follow Up Plan:   Telephone follow-up in 1 week  Medford Balboa, BSN, RN [place]  [facility] - [facility] RN Care Manager [phone]

## 2024-01-28 NOTE — Patient Instructions (Signed)
 Visit Information  Thank you for taking time to visit with me today. Please don't hesitate to contact me if I can be of assistance to you before our next scheduled telephone appointment.  Our next appointment is by telephone on Thursday July 10th at 2:00pm  Following is a copy of your care plan:   Goals Addressed             This Visit's Progress    VBCI Transitions of Care (TOC) Care Plan       Problems: (reviewed 01/28/24) Recent Hospitalization for treatment of Lung cancer with brain mets Knowledge Deficit Related to Confusion related to progressive lung cancer and metastasis to the brain  Goal: (reviewed 01/28/24) Over the next 30 days, the patient will not experience hospital readmission  Interventions:(reviewed 01/28/24)  Oncology: Assessment of understanding of oncology diagnosis:  Assessed patient understanding of cancer diagnosis and recommended treatment plan Reviewed upcoming provider appointments and treatment appointments Assessed available transportation to appointments and treatments. Has consistent/reliable transportation: Yes Assessed support system. Has consistent/reliable family or other support: Yes Nutrition assessment performed Start XRT to the brain to relieve brain mets - The patient is scheduled 02/09/24 for 10 treatments Follow up with providers as scheduled - The patient did not attend her hospital follow up appointment on 01/27/24  Patient Self Care Activities: (reviewed 01/28/24) Attend all scheduled provider appointments Call pharmacy for medication refills 3-7 days in advance of running out of medications Call provider office for new concerns or questions  Notify RN Care Manager of [facility] call rescheduling needs Participate in Transition of Care Program/Attend [facility] scheduled calls Perform all self care activities independently  Take medications as prescribed    Plan:  Telephone follow up appointment with care management team member scheduled for:  Thursday  July 10th at 2:00pm        Patient verbalizes understanding of instructions and care plan provided today and agrees to view in MyChart. Active MyChart status and patient understanding of how to access instructions and care plan via MyChart confirmed with patient.     The patient has been provided with contact information for the care management team and has been advised to call with any health related questions or concerns.   Please call the care guide team at [phone] if you need to cancel or reschedule your appointment.   Please call the Suicide and Crisis Lifeline: 988 call the [place]  National Suicide Prevention Lifeline: 857-377-1179 or TTY: (712)671-4109 TTY [phone]) to talk to a trained counselor if you are experiencing a Mental Health or Behavioral Health Crisis or need someone to talk to.  Medford Balboa, BSN, RN [place]  [facility]BCI - [facility] RN Care Manager [phone]

## 2024-01-30 DIAGNOSIS — E1122 Type 2 diabetes mellitus with diabetic chronic kidney disease: Secondary | ICD-10-CM | POA: Diagnosis not present

## 2024-01-30 DIAGNOSIS — N2581 Secondary hyperparathyroidism of renal origin: Secondary | ICD-10-CM | POA: Diagnosis not present

## 2024-01-30 DIAGNOSIS — N186 End stage renal disease: Secondary | ICD-10-CM | POA: Diagnosis not present

## 2024-01-30 DIAGNOSIS — Z992 Dependence on renal dialysis: Secondary | ICD-10-CM | POA: Diagnosis not present

## 2024-02-02 DIAGNOSIS — Z992 Dependence on renal dialysis: Secondary | ICD-10-CM | POA: Diagnosis not present

## 2024-02-02 DIAGNOSIS — E1122 Type 2 diabetes mellitus with diabetic chronic kidney disease: Secondary | ICD-10-CM | POA: Diagnosis not present

## 2024-02-02 DIAGNOSIS — N2581 Secondary hyperparathyroidism of renal origin: Secondary | ICD-10-CM | POA: Diagnosis not present

## 2024-02-02 DIAGNOSIS — N186 End stage renal disease: Secondary | ICD-10-CM | POA: Diagnosis not present

## 2024-02-02 NOTE — Progress Notes (Shared)
 [facility] - Clinic Note  02/03/2024     CHIEF COMPLAINT Patient presents for No chief complaint on file.   HISTORY OF PRESENT ILLNESS: Amber Cross is a 63 y.o. female who presents to the clinic today for:   Pt states she just got new glasses, but she cannot see out of them, she can't get used to the bifocal  Referring physician: Clem Brands, PA-C  [address] [place],  [place] [address]  HISTORICAL INFORMATION:  Selected notes from the MEDICAL RECORD NUMBER Referred by Clem Brands, PA-C for concern of CME / PDR LEE:  Ocular Hx- PMH-   CURRENT MEDICATIONS: Current Outpatient Medications (Ophthalmic Drugs)  Medication Sig   prednisoLONE acetate (PRED FORTE) 1 % ophthalmic suspension Place 1 drop into the left eye 4 (four) times daily. (Patient not taking: Reported on 01/23/2024)   No current facility-administered medications for this visit. (Ophthalmic Drugs)   Current Outpatient Medications (Other)  Medication Sig   amLODipine  (NORVASC ) 10 MG tablet Take 10 mg by mouth daily.   amlodipine -olmesartan  (AZOR ) 10-20 MG tablet Take 1 tablet by mouth at bedtime.   atorvastatin  (LIPITOR ) 80 MG tablet Take 1 tablet (80 mg total) by mouth daily.   Calcium  Acetate 668 (169 Ca) MG TABS Take 1 tablet by mouth 3 (three) times daily.   Doxercalciferol  (HECTOROL  IV) Take 3 mcg by mouth 3 (three) times a week.   dronabinol  (MARINOL ) 2.5 MG capsule Take 1 capsule (2.5 mg total) by mouth 2 (two) times daily before a meal. (Patient not taking: Reported on 01/23/2024)   fluticasone  (FLONASE ) 50 MCG/ACT nasal spray SPRAY 2 SPRAYS INTO EACH NOSTRIL EVERY DAY   furosemide  (LASIX ) 40 MG tablet Take 1 tablet (40 mg total) by mouth daily as needed (swelling in legs or weight gain).   gabapentin  (NEURONTIN ) 600 MG tablet Take 600 mg by mouth daily.   hydrALAZINE  (APRESOLINE ) 100 MG tablet Take 1 tablet (100 mg total) by mouth 2 (two)  times daily.   Lancet Devices (ADJUSTABLE LANCING DEVICE) MISC    megestrol  (MEGACE  ES) 625 MG/5ML suspension Take 5 mLs (625 mg total) by mouth daily. (Patient not taking: Reported on 01/23/2024)   pantoprazole  (PROTONIX ) 40 MG tablet Take 40 mg by mouth daily.   sevelamer  carbonate (RENVELA ) 800 MG tablet Take 800 mg by mouth 3 (three) times daily with meals.   traZODone  (DESYREL ) 50 MG tablet Take 1 tablet (50 mg total) by mouth at bedtime.   No current facility-administered medications for this visit. (Other)   REVIEW OF SYSTEMS:    ALLERGIES Allergies  Allergen Reactions   Penicillins Itching and Other (See Comments)    redness   Strawberry (Diagnostic) Itching and Swelling   Tomato Itching and Swelling   PAST MEDICAL HISTORY 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   Postop check 08/05/2022   Pseudogout    Stroke (HCC) 10/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   Tamponade 06/18/2022   Tobacco abuse    Past Surgical History:  Procedure Laterality Date   ANTERIOR CRUCIATE LIGAMENT REPAIR Right    AV FISTULA PLACEMENT Left 07/01/2022   Procedure: LEFT ARTERIOVENOUS (AV) FISTULA CREATION;  Surgeon: Eliza Lonni RAMAN, MD;  Location: [facility] OR;  Service: Vascular;  Laterality: Left;  with regional block   BRONCHIAL BIOPSY  12/18/2021   Procedure: BRONCHIAL BIOPSIES;  Surgeon: Brenna Adine CROME, DO;  Location: [facility] ENDOSCOPY;  Service: Pulmonary;;   BRONCHIAL BRUSHINGS  12/18/2021   Procedure: BRONCHIAL BRUSHINGS;  Surgeon: Brenna Adine CROME, DO;  Location: [facility]  ENDOSCOPY;  Service: Pulmonary;;   BRONCHIAL NEEDLE ASPIRATION BIOPSY  12/18/2021   Procedure: BRONCHIAL NEEDLE ASPIRATION BIOPSIES;  Surgeon: Brenna Adine CROME, DO;  Location: [facility] ENDOSCOPY;  Service: Pulmonary;;   CATARACT EXTRACTION     CHEST TUBE INSERTION Right 06/18/2022   Procedure: CHEST TUBE INSERTION;  Surgeon: Obadiah Coy, MD;  Location: [facility] OR;  Service: Thoracic;  Laterality: Right;   EXCHANGE OF A DIALYSIS CATHETER N/A 08/08/2022   Procedure: EXCHANGE OF A TUNNELED DIALYSIS CATHETER;  Surgeon: Serene Gaile ORN, MD;  Location: [facility] OR;  Service: Vascular;  Laterality: N/A;   FIDUCIAL MARKER PLACEMENT  12/18/2021   Procedure: FIDUCIAL MARKER PLACEMENT;  Surgeon: Brenna Adine CROME, DO;  Location: [facility] ENDOSCOPY;  Service: Pulmonary;;   IR FLUORO GUIDE CV LINE RIGHT  06/26/2022   IR US  GUIDE VASC ACCESS RIGHT  06/26/2022   OVARY SURGERY     RIGHT OOPHORECTOMY Right    SMALL INTESTINE SURGERY     SUBXYPHOID PERICARDIAL WINDOW N/A 06/18/2022   Procedure: SUBXYPHOID PERICARDIAL WINDOW;  Surgeon: Obadiah Coy, MD;  Location: [facility] OR;  Service: Thoracic;  Laterality: N/A;   TEE WITHOUT CARDIOVERSION N/A 06/18/2022   Procedure: TRANSESOPHAGEAL ECHOCARDIOGRAM (TEE);  Surgeon: Obadiah Coy, MD;  Location[facility] OR;  Service: Thoracic;  Laterality: N/A;   TUBAL LIGATION     VIDEO BRONCHOSCOPY WITH RADIAL ENDOBRONCHIAL ULTRASOUND  12/18/2021   Procedure: RADIAL ENDOBRONCHIAL ULTRASOUND;  Surgeon: Brenna Adine CROME, DO;  Location: [facility] ENDOSCOPY;  Service: Pulmonary;;   FAMILY HISTORY Family History  Problem Relation Age of Onset   Diabetes Mother    Hypertension Mother    Hypertension Father    SOCIAL HISTORY Social History   Tobacco Use   Smoking status: Every Day    Current packs/day: 0.50    Average packs/day: 0.5 packs/day for 40.0 years (20.0 ttl pk-yrs)    Types: Cigarettes   Smokeless tobacco: Never   Tobacco comments:    Cutting back 1/4-1/2 ppd  Vaping Use   Vaping status:  Never Used  Substance Use Topics   Alcohol use: Not Currently   Drug use: Yes    Frequency: 4.0 times per week    Types: Marijuana    Comment: smokes every other day      OPHTHALMIC EXAM:  Not recorded    IMAGING AND PROCEDURES  Imaging and Procedures for 02/03/2024          ASSESSMENT/PLAN: No diagnosis found.   1,2 Severe Non-proliferative diabetic retinopathy, both eyes  - A1C 7.8 (08.13.24), 10.4 (05.09.24), 6.7 (11.30.23) - s/p IVA OD #1 (05.02.24), #2 (05.30.24), #3 (07.24.24), #4 (08.21.24), #5 (10.08.24), #6 (11.05.24), #7 (12.10.24), #8 (01.14.25), #9 (02.18.25), #10 (03.25.25), #11 (04.29.25), #12 (06.03.25) - s/p IVA OS #1 (04.04.24), #2 (05.02.24), #3 (05.30.24), #4 (07.24.24), #5 (08.21.24) -- IVA resistance OS - s/p IVE OS #1 (10.08.24), #2 (11.05.24), #3 (  12.10.24), #4 (01.14.25), #5 (02.18.25), #6 (03.25.25), #7 (04.29.25), #8 (06.03.25) - BCVA OD 20/40 from 20/25, OS 20/40 from 20/25 - exam shows scattered MA/DBH and exudates OU (OS > OD) - FA (04.04.24) shows leaking MA greatest posteriorly, no NV OU - OCT shows  OD: persistent IRF / IRHM greatest temporal and superior mac; OS: Persistent IRF/IRHM greatest IT macula at 5 weeks - recommend IVA OD #13 and IVE OS #9 today,07.08.25 for DME w/ f/u in 5 wks again - pt wishes to proceed - RBA of procedure discussed, questions answered - Eylea  approved with insurance - IVA informed consent obtained and signed, 04.04.24 (OU) - IVE informed consent obtained and signed, 10.08.24 (OU) - see procedure note - f/u in 5 wks -- DFE/OCT, possible injection  3,4. Hypertensive retinopathy OU - discussed importance of tight BP control - monitor  5. Pseudophakia OU  - s/p CE/IOL OU (Dr. Octavia, OD: 2017, OS: 03.04.25)  - IOL in good position, doing well  - monitor  Ophthalmic Meds Ordered this visit:  No orders of the defined types were placed in this encounter.   No follow-ups on file.  There are no Patient  Instructions on file for this visit.  Explained the diagnoses, plan, and follow up with the patient and they expressed understanding.  Patient expressed understanding of the importance of proper follow up care.   This document serves as a record of services personally performed by Redell JUDITHANN Hans, MD, PhD. It was created on their behalf by Auston Muzzy, COMT. The creation of this record is the provider's dictation and/or activities during the visit.  Electronically signed by: Auston Muzzy, COMT 02/02/24 3:13 PM    Redell JUDITHANN Hans, M.D., Ph.D. Diseases & Surgery of the Retina and Vitreous [facility]    Abbreviations: M myopia (nearsighted); A astigmatism; H hyperopia (farsighted); P presbyopia; Mrx spectacle prescription;  CTL contact lenses; OD right eye; OS left eye; OU both eyes  XT exotropia; ET esotropia; PEK punctate epithelial keratitis; PEE punctate epithelial erosions; DES dry eye syndrome; MGD meibomian gland dysfunction; ATs artificial tears; PFAT's preservative free artificial tears; NSC nuclear sclerotic cataract; PSC posterior subcapsular cataract; ERM epi-retinal membrane; PVD posterior vitreous detachment; RD retinal detachment; DM diabetes mellitus; DR diabetic retinopathy; NPDR non-proliferative diabetic retinopathy; PDR proliferative diabetic retinopathy; CSME clinically significant macular edema; DME diabetic macular edema; dbh dot blot hemorrhages; CWS cotton wool spot; POAG primary open angle glaucoma; C/D cup-to-disc ratio; HVF humphrey visual field; GVF goldmann visual field; OCT optical coherence tomography; IOP intraocular pressure; BRVO Branch retinal vein occlusion; CRVO central retinal vein occlusion; CRAO central retinal artery occlusion; BRAO branch retinal artery occlusion; RT retinal tear; SB scleral buckle; PPV pars plana vitrectomy; VH Vitreous hemorrhage; PRP panretinal laser photocoagulation; IVK intravitreal kenalog; VMT vitreomacular  traction; MH Macular hole;  NVD neovascularization of the disc; NVE neovascularization elsewhere; AREDS age related eye disease study; ARMD age related macular degeneration; POAG primary open angle glaucoma; EBMD epithelial/anterior basement membrane dystrophy; ACIOL anterior chamber intraocular lens; IOL intraocular lens; PCIOL posterior chamber intraocular lens; Phaco/IOL phacoemulsification with intraocular lens placement; PRK photorefractive keratectomy; LASIK laser assisted in situ keratomileusis; HTN hypertension; DM diabetes mellitus; COPD chronic obstructive pulmonary disease

## 2024-02-03 ENCOUNTER — Encounter (INDEPENDENT_AMBULATORY_CARE_PROVIDER_SITE_OTHER): Admitting: Ophthalmology

## 2024-02-03 ENCOUNTER — Encounter: Payer: Self-pay | Admitting: Hematology & Oncology

## 2024-02-04 DIAGNOSIS — E1122 Type 2 diabetes mellitus with diabetic chronic kidney disease: Secondary | ICD-10-CM | POA: Diagnosis not present

## 2024-02-04 DIAGNOSIS — Z992 Dependence on renal dialysis: Secondary | ICD-10-CM | POA: Diagnosis not present

## 2024-02-04 DIAGNOSIS — N186 End stage renal disease: Secondary | ICD-10-CM | POA: Diagnosis not present

## 2024-02-04 DIAGNOSIS — N2581 Secondary hyperparathyroidism of renal origin: Secondary | ICD-10-CM | POA: Diagnosis not present

## 2024-02-05 ENCOUNTER — Other Ambulatory Visit: Payer: Self-pay

## 2024-02-05 DIAGNOSIS — C3490 Malignant neoplasm of unspecified part of unspecified bronchus or lung: Secondary | ICD-10-CM | POA: Diagnosis not present

## 2024-02-05 DIAGNOSIS — C7931 Secondary malignant neoplasm of brain: Secondary | ICD-10-CM | POA: Diagnosis not present

## 2024-02-05 DIAGNOSIS — Z510 Encounter for antineoplastic radiation therapy: Secondary | ICD-10-CM | POA: Diagnosis not present

## 2024-02-05 NOTE — Transitions of Care (Post Inpatient/ED Visit) (Signed)
 Transition of Care week 4  Visit Note  02/05/2024  Name: Amber Cross MRN: [id number]          DOB: 1961/02/28  Situation: Patient enrolled in [facility] 30-day program. Visit completed with [name] by telephone.   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   Postop check 08/05/2022   Pseudogout    Stroke (HCC) 10/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   Tamponade 06/18/2022   Tobacco abuse     Assessment: Patient Reported Symptoms: Cognitive Cognitive Status: Difficulties with attention and concentration, Struggling with memory recall Cognitive/Intellectual Conditions Management [RPT]: Other Other: Metastatic Lung Cancer with Brain Mets      Neurological Neurological Review of Symptoms: No symptoms reported    HEENT HEENT Symptoms Reported: No symptoms reported      Cardiovascular Cardiovascular Symptoms Reported: Swelling in legs or feet Does patient have uncontrolled Hypertension?: No Cardiovascular Management Strategies: Medication therapy, Weight management Do You Have a Working Readable Scale?: Yes Weight: 129 lb (58.5 kg) Cardiovascular Comment: The patient does not weigh at home. She weights at dialysis  Respiratory Respiratory Symptoms Reported: No symptoms reported    Endocrine Endocrine Symptoms Reported: No symptoms reported Is patient diabetic?: Yes Is patient checking blood sugars at home?: No    Gastrointestinal Gastrointestinal Symptoms Reported: No symptoms reported   Nutrition Risk Screen (CP): Reduced oral intake  over the last month, Unintentional loss of 10 lbs or more in the past 2 months  Genitourinary Genitourinary Symptoms Reported: No symptoms reported Other Genitourinary Symptoms: Dialysis Genitourinary Management Strategies: Hemodialysis Hemodialysis Schedule: Monday , Wednesday, Friday Hemodialysis Last Treatment: 02/04/24 Genitourinary Self-Management Outcome: 4 (good)  Integumentary Integumentary Symptoms Reported: No symptoms reported, Wound Additional Integumentary Details: Dialysis access Skin Management Strategies: Routine screening Skin Self-Management Outcome: 4 (good)  Musculoskeletal Musculoskelatal Symptoms Reviewed: Weakness Musculoskeletal Management Strategies: Adequate rest Musculoskeletal Self-Management Outcome: 4 (good)      Psychosocial Psychosocial Symptoms Reported: No symptoms reported         There were no vitals filed for this visit.  Medications Reviewed Today     Reviewed by Moises Reusing, RN (Case Manager) on 02/05/24 at 1427  Med List Status: <None>   Medication Order Taking? Sig Documenting Provider Last Dose Status Informant  amLODipine  (NORVASC ) 10 MG tablet [id number]  Take 10 mg by mouth daily. [provider]  Active   amlodipine -olmesartan  (AZOR ) 10-20 MG tablet [id number]  Take 1 tablet by mouth at bedtime. Cyndi Shaver, PA-C  Active   atorvastatin  (LIPITOR ) 80 MG tablet [id number]  Take 1 tablet (80 mg total) by mouth daily. Amoako, Prince, MD  Active   Calcium  Acetate 668 (169 Ca) MG TABS [id number]  Take 1 tablet by mouth 3 (three) times daily. [provider]  Active   Doxercalciferol  (HECTOROL  IV) [id number]  Take 3 mcg by mouth 3 (three) times a week. [provider]  Active   dronabinol  (MARINOL ) 2.5 MG capsule [id number]  Take 1  capsule (2.5 mg total) by mouth 2 (two) times daily before a meal.  Patient not taking: Reported on 01/23/2024   Ennever, Peter R, MD  Active   fluticasone  (FLONASE ) 50 MCG/ACT nasal  spray [id number]  SPRAY 2 SPRAYS INTO EACH NOSTRIL EVERY DAY Cyndi Shaver, PA-C  Active   furosemide  (LASIX ) 40 MG tablet [id number]  Take 1 tablet (40 mg total) by mouth daily as needed (swelling in legs or weight gain). Emmy Justus DEL, MD  Expired 01/23/24 2359            Med Note [name] [place] Feb 13, 2023  2:55 PM) 02/13/2023 Takes on non-dialysis days.  gabapentin  (NEURONTIN ) 600 MG tablet [id number]  Take 600 mg by mouth daily. [provider]  Active   hydrALAZINE  (APRESOLINE ) 100 MG tablet [id number]  Take 1 tablet (100 mg total) by mouth 2 (two) times daily. Cyndi Shaver, PA-C  Active   Lancet Devices (ADJUSTABLE LANCING DEVICE) MISC [id number]   [provider]  Active   megestrol  (MEGACE  ES) 625 MG/5ML suspension [id number]  Take 5 mLs (625 mg total) by mouth daily.  Patient not taking: Reported on 01/23/2024   Timmy Maude SAUNDERS, MD  Active   pantoprazole  (PROTONIX ) 40 MG tablet [id number]  Take 40 mg by mouth daily. [provider]  Active   prednisoLONE acetate (PRED FORTE) 1 % ophthalmic suspension [id number]  Place 1 drop into the left eye 4 (four) times daily.  Patient not taking: Reported on 01/23/2024   [provider]  Active   sevelamer  carbonate (RENVELA ) 800 MG tablet [id number]  Take 800 mg by mouth 3 (three) times daily with meals. [provider]  Active   traZODone  (DESYREL ) 50 MG tablet [id number]  Take 1 tablet (50 mg total) by mouth at bedtime. Renne Homans, MD  Active             Recommendation:   Continue Current Plan of Care  Follow Up Plan:   Telephone follow-up in 1 week  Medford Balboa, BSN, RN [place]  VBCI - [facility] RN Care Manager [phone]

## 2024-02-05 NOTE — Patient Instructions (Signed)
 Visit Information  Thank you for taking time to visit with me today. Please don't hesitate to contact me if I can be of assistance to you before our next scheduled telephone appointment.  Our next appointment is by telephone on Thursday July 17th at 10:30am  Following is a copy of your care plan:   Goals Addressed             This Visit's Progress    VBCI Transitions of Care (TOC) Care Plan       Problems: (reviewed 02/05/24) Recent Hospitalization for treatment of Lung cancer with brain mets Knowledge Deficit Related to Confusion related to progressive lung cancer and metastasis to the brain  Goal: (reviewed 02/05/24) Over the next 30 days, the patient will not experience hospital readmission  Interventions:(reviewed 02/05/24)  Oncology: Assessment of understanding of oncology diagnosis:  Assessed patient understanding of cancer diagnosis and recommended treatment plan Reviewed upcoming provider appointments and treatment appointments Assessed available transportation to appointments and treatments. Has consistent/reliable transportation: Yes Assessed support system. Has consistent/reliable family or other support: Yes Nutrition assessment performed Start XRT to the brain to relieve brain mets - The patient is start her schedule 02/09/24 for 10 treatments Follow up with providers as scheduled - The patient did not attend her hospital follow up appointment on 01/27/24.   Patient Self Care Activities: (reviewed 02/05/24) Attend all scheduled provider appointments Call pharmacy for medication refills 3-7 days in advance of running out of medications Call provider office for new concerns or questions  Notify RN Care Manager of [facility] call rescheduling needs Participate in Transition of Care Program/Attend [facility] scheduled calls Perform all self care activities independently  Take medications as prescribed    Plan:  Telephone follow up appointment with care management team member scheduled  for:  Thursday July 17th at 10:30ampm        Patient verbalizes understanding of instructions and care plan provided today and agrees to view in MyChart. Active MyChart status and patient understanding of how to access instructions and care plan via MyChart confirmed with patient.     The patient has been provided with contact information for the care management team and has been advised to call with any health related questions or concerns.   Please call the care guide team at [phone] if you need to cancel or reschedule your appointment.   Please call the Suicide and Crisis Lifeline: 988 call the [place]  National Suicide Prevention Lifeline: (774) 549-1220 or TTY: 859-007-1880 TTY (662)362-4167) to talk to a trained counselor if you are experiencing a Mental Health or Behavioral Health Crisis or need someone to talk to.  Medford Balboa, BSN, RN [place]  VBCI - [employer] [facility] RN Care Manager [phone] \

## 2024-02-06 ENCOUNTER — Inpatient Hospital Stay

## 2024-02-06 ENCOUNTER — Inpatient Hospital Stay: Admitting: Family

## 2024-02-06 DIAGNOSIS — E1122 Type 2 diabetes mellitus with diabetic chronic kidney disease: Secondary | ICD-10-CM | POA: Diagnosis not present

## 2024-02-06 DIAGNOSIS — N2581 Secondary hyperparathyroidism of renal origin: Secondary | ICD-10-CM | POA: Diagnosis not present

## 2024-02-06 DIAGNOSIS — Z510 Encounter for antineoplastic radiation therapy: Secondary | ICD-10-CM | POA: Diagnosis not present

## 2024-02-06 DIAGNOSIS — C3490 Malignant neoplasm of unspecified part of unspecified bronchus or lung: Secondary | ICD-10-CM | POA: Diagnosis not present

## 2024-02-06 DIAGNOSIS — N186 End stage renal disease: Secondary | ICD-10-CM | POA: Diagnosis not present

## 2024-02-06 DIAGNOSIS — C7931 Secondary malignant neoplasm of brain: Secondary | ICD-10-CM | POA: Diagnosis not present

## 2024-02-06 DIAGNOSIS — Z992 Dependence on renal dialysis: Secondary | ICD-10-CM | POA: Diagnosis not present

## 2024-02-08 ENCOUNTER — Other Ambulatory Visit: Payer: Self-pay | Admitting: Student

## 2024-02-09 DIAGNOSIS — Z510 Encounter for antineoplastic radiation therapy: Secondary | ICD-10-CM | POA: Diagnosis not present

## 2024-02-09 DIAGNOSIS — C3490 Malignant neoplasm of unspecified part of unspecified bronchus or lung: Secondary | ICD-10-CM | POA: Diagnosis not present

## 2024-02-09 DIAGNOSIS — N186 End stage renal disease: Secondary | ICD-10-CM | POA: Diagnosis not present

## 2024-02-09 DIAGNOSIS — E1122 Type 2 diabetes mellitus with diabetic chronic kidney disease: Secondary | ICD-10-CM | POA: Diagnosis not present

## 2024-02-09 DIAGNOSIS — N2581 Secondary hyperparathyroidism of renal origin: Secondary | ICD-10-CM | POA: Diagnosis not present

## 2024-02-09 DIAGNOSIS — Z992 Dependence on renal dialysis: Secondary | ICD-10-CM | POA: Diagnosis not present

## 2024-02-09 DIAGNOSIS — C7931 Secondary malignant neoplasm of brain: Secondary | ICD-10-CM | POA: Diagnosis not present

## 2024-02-10 ENCOUNTER — Encounter: Payer: Self-pay | Admitting: Hematology & Oncology

## 2024-02-10 DIAGNOSIS — C7931 Secondary malignant neoplasm of brain: Secondary | ICD-10-CM | POA: Diagnosis not present

## 2024-02-10 DIAGNOSIS — C3490 Malignant neoplasm of unspecified part of unspecified bronchus or lung: Secondary | ICD-10-CM | POA: Diagnosis not present

## 2024-02-10 DIAGNOSIS — Z510 Encounter for antineoplastic radiation therapy: Secondary | ICD-10-CM | POA: Diagnosis not present

## 2024-02-10 NOTE — Progress Notes (Signed)
 Oncology Navigation Follow Up Encounter   Nurse Navigator received message from [employer]:Hey Luke, Amber Cross is in need of a ride for her treatment on Thursday. She doesn't have anyone to bring her and she can't afford uber so she asked if we had any resources. I told her I would reach out and find out if we could get her setup for the uber health rides. I believe it is just needed for that one day. I will be off the rest of the week but if you could report any information to Philippe Harman that would be great! I told patient we would have information for her at her 11:45 appt tomorrow. Thanks!   Nurse Navigator spoke with Rollo Na, will use [employer] funds to pay for [employer] 1 round trip.  Nurse Navigator set up [place] round trip for patient on 02/12/2024 p/u 11am.   July 17th, 2025: Thursday Amber Cross +1 [phone] Thursday, July 17th, 2025 [facility] July 17th - 11:00 AM EDT Round Trip    Nurse Navigator contacted patient to make aware, and stayed on phone until patient received confirmation text from [place]. Patient verbalized understanding and expressed gratitude. All questions answered.   Nurse Navigator spoke wit Iona Bare, will assist patient with setting up return trip home, if necessary. Patient made aware. Patient and family were encouraged to reach out to Nurse Navigator with any questions or concerns. Nurse Navigator will continue to follow and assist with needs as they arise.   Caley Potts and Philippe Frock made aware.     ONC Nurse Jonelle:  Provisional Review Docflow       02/10/2024  ONCBCN Nurse Provisional review  New Oncology Navigation / Initial Contact  3  Newly diagnosed patient Internal  Second opinion No  Transitioning care from outside of [facility] No  Cancer recurrence or multiple sites Yes  Navigator Referral Source Navigator Initiated  Provisional Review Yes  Cancer Disease Type: Thoracic / Non-Small Cell  Is Genetic Counseling  needed? No  ,  Nurse Assessment       02/10/2024  Onc Nurse Plan and Assessment  Is patient navigated or consulted? Navigated  Is patient pediatric No  Reason for consult Coordination of care  Disease Group: Thoracic  Primary Cancer Site Thoracic-NSCLC  Date of Initial Contact 02/10/2024  ,  Communication and Cultural needs       02/10/2024  Communication cultural needs  Communication/Cultural Needs (check all that apply) Preferred language  Was interpreter marked as needed in EMR? (choose 1) No  How well do you speak english? (choose 1) Well  In general, in what language do you prefer to receive your medical care? (choose 1) English  How well can you discuss your symptoms with your doctors in English? (choose 1) Well  How well can you understand your doctors' recommendations in English? (choose 1) Well  Interpreter needed based on navigator assessment? No  ,  Barriers and Needs       02/10/2024  Barriers and Needs  Transportation Means no transportation  Distance from treatment/resources 30 mins  Is patient primary caregiver for others? N  Support System Family support;Friends  Family/Friends support Children;Other;Neighbors  Type of Residence Private residence  Private residence Own  Residence status Permanent  Sensory deficits None  Insurance Status Medicare  Treatment Logistics (check all that apply) Parking;Other;Transportation needed  Vocational history Disability  ,  Education       02/10/2024  Education  Disease Management Community Resources;Support oncology calendar;Treatment  plan/ and or Procedures;other  Clinical concern with Medical question;Supportive care clinic;Other  Info/Education (check all that apply) Tx information;Symptom management (Specify);Other  Patient Learning Preferences Listening  ,  Coordination of Care       02/10/2024  Coordination of care  Institution/Systems Issues (check all that apply) Information Delivery;Ease of making appts   Care Coordination Appointment coordination  ,  Second Opinion       02/10/2024  Second Opinion  Second Opinion Visit No  , and  Wrap Up       02/10/2024  Wrap Up  Initiated by: gardiner) Navigator  Type of contact Care coordination  Cancer phase Active Tx  Intervention Chart review;Information provided;Coordination of care;Referral;Support provided;Needs assessment     The plan for the patient is : appointments as scheduled   All questions answered to patient satisfaction and patient encouraged to communicate back if further needs or concerns arise.    Suzen Arlean Acosta, RN

## 2024-02-11 DIAGNOSIS — C3490 Malignant neoplasm of unspecified part of unspecified bronchus or lung: Secondary | ICD-10-CM | POA: Diagnosis not present

## 2024-02-11 DIAGNOSIS — Z992 Dependence on renal dialysis: Secondary | ICD-10-CM | POA: Diagnosis not present

## 2024-02-11 DIAGNOSIS — E1122 Type 2 diabetes mellitus with diabetic chronic kidney disease: Secondary | ICD-10-CM | POA: Diagnosis not present

## 2024-02-11 DIAGNOSIS — Z510 Encounter for antineoplastic radiation therapy: Secondary | ICD-10-CM | POA: Diagnosis not present

## 2024-02-11 DIAGNOSIS — C7931 Secondary malignant neoplasm of brain: Secondary | ICD-10-CM | POA: Diagnosis not present

## 2024-02-11 DIAGNOSIS — N186 End stage renal disease: Secondary | ICD-10-CM | POA: Diagnosis not present

## 2024-02-11 DIAGNOSIS — N2581 Secondary hyperparathyroidism of renal origin: Secondary | ICD-10-CM | POA: Diagnosis not present

## 2024-02-12 ENCOUNTER — Other Ambulatory Visit: Payer: Self-pay

## 2024-02-12 VITALS — Wt 129.0 lb

## 2024-02-12 DIAGNOSIS — C3411 Malignant neoplasm of upper lobe, right bronchus or lung: Secondary | ICD-10-CM

## 2024-02-12 DIAGNOSIS — Z510 Encounter for antineoplastic radiation therapy: Secondary | ICD-10-CM | POA: Diagnosis not present

## 2024-02-12 DIAGNOSIS — R770 Abnormality of albumin: Secondary | ICD-10-CM | POA: Diagnosis not present

## 2024-02-12 DIAGNOSIS — C7931 Secondary malignant neoplasm of brain: Secondary | ICD-10-CM | POA: Diagnosis not present

## 2024-02-12 DIAGNOSIS — C3490 Malignant neoplasm of unspecified part of unspecified bronchus or lung: Secondary | ICD-10-CM | POA: Diagnosis not present

## 2024-02-12 DIAGNOSIS — N186 End stage renal disease: Secondary | ICD-10-CM | POA: Diagnosis not present

## 2024-02-12 NOTE — Transitions of Care (Post Inpatient/ED Visit) (Signed)
 Transition of Care week 5  Visit Note  02/12/2024  Name: Amber Cross MRN: [id number]          DOB: Nov 17, 1960  Situation: Patient enrolled in [facility] 30-day program. Visit completed with [name] by telephone.   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   Postop check 08/05/2022   Pseudogout    Stroke (HCC) 10/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   Tamponade 06/18/2022   Tobacco abuse     Assessment: Patient Reported Symptoms: Cognitive Cognitive Status: Alert and oriented to person, place, and time, Struggling with memory recall, Difficulties with attention and concentration Other: 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      Neurological   Neurological Management Strategies: Medication therapy  HEENT HEENT Symptoms Reported: No symptoms reported      Cardiovascular Cardiovascular Symptoms Reported: No symptoms reported Does patient have uncontrolled Hypertension?: No Cardiovascular Management Strategies: Medication therapy, Weight management Do You Have a Working Readable Scale?: Yes Weight: 129 lb (58.5 kg)  Respiratory Respiratory Symptoms Reported: No symptoms reported    Endocrine Endocrine Symptoms Reported: No symptoms reported Is patient diabetic?: Yes Is patient checking blood sugars at home?: No    Gastrointestinal Gastrointestinal Symptoms Reported: No symptoms reported      Genitourinary Genitourinary Symptoms Reported: No symptoms reported Other Genitourinary Symptoms: Dialysis Genitourinary  Management Strategies: Hemodialysis Hemodialysis Schedule: Monday Wednesday and Friday Hemodialysis Last Treatment: 02/11/24 Genitourinary Self-Management Outcome: 4 (good)  Integumentary Integumentary Symptoms Reported: No symptoms reported, Wound Additional Integumentary Details: Dialysis access Skin Management Strategies: Routine screening  Musculoskeletal Musculoskelatal Symptoms Reviewed: Weakness Musculoskeletal Management Strategies: Adequate rest, Activity      Psychosocial Psychosocial Symptoms Reported: No symptoms reported Additional Psychological Details: She is mad because her car is not working and she has been trying to get it fixed for weeks         There were no vitals filed for this visit.  Medications Reviewed Today     Reviewed by Moises Reusing, RN (Case Manager) on 02/12/24 at [phone]  Med List Status: <None>   Medication Order Taking? Sig Documenting Provider Last Dose Status Informant  amLODipine  (NORVASC ) 10 MG tablet [id number]  Take 10 mg by mouth daily. [provider]  Active   amlodipine -olmesartan  (AZOR ) 10-20 MG tablet [id number]  Take 1 tablet by mouth at bedtime. Cyndi Shaver, PA-C  Active   atorvastatin  (LIPITOR ) 80 MG tablet [id number]  Take 1 tablet (80 mg total) by mouth daily. Amoako, Prince, MD  Active   Calcium  Acetate 668 (169 Ca) MG TABS [id number]  Take 1 tablet by mouth 3 (three) times daily. [provider]  Active   Doxercalciferol  (HECTOROL  IV) [id number]  Take 3 mcg by mouth 3 (three) times a week. [provider]  Active   dronabinol  (MARINOL ) 2.5 MG capsule [id number]  Take 1 capsule (2.5 mg total) by mouth 2 (two) times daily before a meal.  Patient not taking: Reported  on 01/23/2024   Ennever, Peter R, MD  Active   fluticasone  (FLONASE ) 50 MCG/ACT nasal spray [id number]  SPRAY 2 SPRAYS INTO EACH NOSTRIL EVERY DAY Cyndi Shaver, PA-C  Active   furosemide  (LASIX ) 40 MG tablet [id number]  Take 1 tablet (40 mg  total) by mouth daily as needed (swelling in legs or weight gain). Emmy Justus DEL, MD  Expired 01/23/24 2359            Med Note [name] [place] Feb 13, 2023  2:55 PM) 02/13/2023 Takes on non-dialysis days.  gabapentin  (NEURONTIN ) 600 MG tablet [id number]  Take 600 mg by mouth daily. [provider]  Active   hydrALAZINE  (APRESOLINE ) 100 MG tablet [id number]  Take 1 tablet (100 mg total) by mouth 2 (two) times daily. Cyndi Shaver, PA-C  Active   Lancet Devices (ADJUSTABLE LANCING DEVICE) MISC [id number]   [provider]  Active   megestrol  (MEGACE  ES) 625 MG/5ML suspension [id number]  Take 5 mLs (625 mg total) by mouth daily.  Patient not taking: Reported on 01/23/2024   Timmy Maude SAUNDERS, MD  Active   pantoprazole  (PROTONIX ) 40 MG tablet [id number]  Take 40 mg by mouth daily. [provider]  Active   prednisoLONE acetate (PRED FORTE) 1 % ophthalmic suspension [id number]  Place 1 drop into the left eye 4 (four) times daily.  Patient not taking: Reported on 01/23/2024   [provider]  Active   sevelamer  carbonate (RENVELA ) 800 MG tablet [id number]  Take 800 mg by mouth 3 (three) times daily with meals. [provider]  Active   traZODone  (DESYREL ) 50 MG tablet [id number]  Take 1 tablet (50 mg total) by mouth at bedtime. Renne Homans, MD  Active             Recommendation:   Referral to: CCM  Follow Up Plan:   Closing From:  Transitions of Care Program  [facility], BSN, RN [place]  VBCI - [facility] RN Care Manager [phone]

## 2024-02-12 NOTE — Addendum Note (Signed)
 Addended by: Mayleigh Tetrault on: 02/12/2024 10:31 AM   Modules accepted: Orders

## 2024-02-12 NOTE — Patient Instructions (Signed)
 Visit Information  Thank you for taking time to visit with me today. Please don't hesitate to contact me if I can be of assistance to you before our next scheduled telephone appointment.  Following is a copy of your care plan:   Goals Addressed             This Visit's Progress    COMPLETED: VBCI Transitions of Care (TOC) Care Plan       Problems: (reviewed 02/12/24) Recent Hospitalization for treatment of Lung cancer with brain mets Knowledge Deficit Related to Confusion related to progressive lung cancer and metastasis to the brain  Goal: (reviewed 02/12/24) Over the next 30 days, the patient will not experience hospital readmission  Interventions:(reviewed 02/12/24)  Oncology: Assessment of understanding of oncology diagnosis:  Assessed patient understanding of cancer diagnosis and recommended treatment plan Reviewed upcoming provider appointments and treatment appointments Assessed available transportation to appointments and treatments. Has consistent/reliable transportation: Yes Assessed support system. Has consistent/reliable family or other support: Yes Nutrition assessment performed Start XRT to the brain to relieve brain mets - The patient is start her schedule 02/09/24 for 10 treatments Follow up with providers as scheduled - The patient did not attend her hospital follow up appointment on 01/27/24. -  - 02/12/24 - She declines making an appointment until her car is fixed due to the cost of transportation. She has a person fixing her car today. She has XRT at 11:28m and the [facility] has paid for an Uber  Patient Self Care Activities: (reviewed 02/12/24) Attend all scheduled provider appointments Call pharmacy for medication refills 3-7 days in advance of running out of medications Call provider office for new concerns or questions  Notify RN Care Manager of [facility] call rescheduling needs Participate in Transition of Care Program/Attend TOC scheduled calls Perform all self  care activities independently  Take medications as prescribed    Plan:  Telephone follow up appointment with care management team member scheduled for:  The patient has completed the 30 Day TOC Program. Transfer to CCM        Patient verbalizes understanding of instructions and care plan provided today and agrees to view in MyChart. Active MyChart status and patient understanding of how to access instructions and care plan via MyChart confirmed with patient.     The patient has been provided with contact information for the care management team and has been advised to call with any health related questions or concerns.   Please call the care guide team at [phone] if you need to cancel or reschedule your appointment.   Please call the Suicide and Crisis Lifeline: 988 call the [place]  National Suicide Prevention Lifeline: (331)483-0571 or TTY: (806) 135-5968 TTY 860 072 2727) to talk to a trained counselor if you are experiencing a Mental Health or Behavioral Health Crisis or need someone to talk to.  Medford Balboa, BSN, RN [place]  VBCI - [employer] [facility] RN Care Manager [phone]

## 2024-02-13 DIAGNOSIS — C3490 Malignant neoplasm of unspecified part of unspecified bronchus or lung: Secondary | ICD-10-CM | POA: Diagnosis not present

## 2024-02-13 DIAGNOSIS — Z510 Encounter for antineoplastic radiation therapy: Secondary | ICD-10-CM | POA: Diagnosis not present

## 2024-02-13 DIAGNOSIS — E1122 Type 2 diabetes mellitus with diabetic chronic kidney disease: Secondary | ICD-10-CM | POA: Diagnosis not present

## 2024-02-13 DIAGNOSIS — C7931 Secondary malignant neoplasm of brain: Secondary | ICD-10-CM | POA: Diagnosis not present

## 2024-02-13 DIAGNOSIS — Z992 Dependence on renal dialysis: Secondary | ICD-10-CM | POA: Diagnosis not present

## 2024-02-13 DIAGNOSIS — N186 End stage renal disease: Secondary | ICD-10-CM | POA: Diagnosis not present

## 2024-02-13 DIAGNOSIS — N2581 Secondary hyperparathyroidism of renal origin: Secondary | ICD-10-CM | POA: Diagnosis not present

## 2024-02-16 DIAGNOSIS — N186 End stage renal disease: Secondary | ICD-10-CM | POA: Diagnosis not present

## 2024-02-16 DIAGNOSIS — C7931 Secondary malignant neoplasm of brain: Secondary | ICD-10-CM | POA: Diagnosis not present

## 2024-02-16 DIAGNOSIS — Z510 Encounter for antineoplastic radiation therapy: Secondary | ICD-10-CM | POA: Diagnosis not present

## 2024-02-16 DIAGNOSIS — Z992 Dependence on renal dialysis: Secondary | ICD-10-CM | POA: Diagnosis not present

## 2024-02-16 DIAGNOSIS — N2581 Secondary hyperparathyroidism of renal origin: Secondary | ICD-10-CM | POA: Diagnosis not present

## 2024-02-16 DIAGNOSIS — C3490 Malignant neoplasm of unspecified part of unspecified bronchus or lung: Secondary | ICD-10-CM | POA: Diagnosis not present

## 2024-02-16 DIAGNOSIS — E1122 Type 2 diabetes mellitus with diabetic chronic kidney disease: Secondary | ICD-10-CM | POA: Diagnosis not present

## 2024-02-16 NOTE — Progress Notes (Signed)
 [facility] - Clinic Note  02/18/2024     CHIEF COMPLAINT Patient presents for Retina Follow Up   HISTORY OF PRESENT ILLNESS: Amber Cross is a 63 y.o. female who presents to the clinic today for:  HPI     Retina Follow Up   Patient presents with  Diabetic Retinopathy.  In both eyes.  This started 15 months ago.  Duration of 7 weeks.  Since onset it is stable.  I, the attending physician,  performed the HPI with the patient and updated documentation appropriately.        Comments   Pt states she hasn't noticed changes in vision but new glasses are not working for her. Pt states she has had chemo and dialysis this morning and is exhausted. PT denies FOL/floaters/pain.      Last edited by Valdemar Rogue, MD on 02/18/2024  6:12 PM.     Pt states she just got new glasses, but she cannot see out of them, she can't get used to the bifocal  Referring physician: Clem Brands, PA-C  [facility] [address] [place],  [place] [address]  HISTORICAL INFORMATION:  Selected notes from the MEDICAL RECORD NUMBER Referred by Clem Brands, PA-C for concern of CME / PDR LEE:  Ocular Hx- PMH-   CURRENT MEDICATIONS: Current Outpatient Medications (Ophthalmic Drugs)  Medication Sig   prednisoLONE acetate (PRED FORTE) 1 % ophthalmic suspension Place 1 drop into the left eye 4 (four) times daily. (Patient not taking: Reported on 01/23/2024)   No current facility-administered medications for this visit. (Ophthalmic Drugs)   Current Outpatient Medications (Other)  Medication Sig   amLODipine  (NORVASC ) 10 MG tablet Take 10 mg by mouth daily.   amlodipine -olmesartan  (AZOR ) 10-20 MG tablet Take 1 tablet by mouth at bedtime.   atorvastatin  (LIPITOR ) 80 MG tablet Take 1 tablet (80 mg total) by mouth daily.   Calcium  Acetate 668 (169 Ca) MG TABS Take 1 tablet by mouth 3 (three) times daily.   Doxercalciferol  (HECTOROL  IV) Take 3 mcg by mouth 3  (three) times a week.   dronabinol  (MARINOL ) 2.5 MG capsule Take 1 capsule (2.5 mg total) by mouth 2 (two) times daily before a meal. (Patient not taking: Reported on 01/23/2024)   fluticasone  (FLONASE ) 50 MCG/ACT nasal spray SPRAY 2 SPRAYS INTO EACH NOSTRIL EVERY DAY   furosemide  (LASIX ) 40 MG tablet Take 1 tablet (40 mg total) by mouth daily as needed (swelling in legs or weight gain).   gabapentin  (NEURONTIN ) 100 MG capsule Take 1 capsule (100 mg total) by mouth daily.   hydrALAZINE  (APRESOLINE ) 100 MG tablet Take 1 tablet (100 mg total) by mouth 2 (two) times daily.   Lancet Devices (ADJUSTABLE LANCING DEVICE) MISC    megestrol  (MEGACE  ES) 625 MG/5ML suspension Take 5 mLs (625 mg total) by mouth daily. (Patient not taking: Reported on 01/23/2024)   pantoprazole  (PROTONIX ) 40 MG tablet Take 40 mg by mouth daily.   sevelamer  carbonate (RENVELA ) 800 MG tablet Take 800 mg by mouth 3 (three) times daily with meals.   traZODone  (DESYREL ) 50 MG tablet Take 1 tablet (50 mg total) by mouth at bedtime.   No current facility-administered medications for this visit. (Other)   REVIEW OF SYSTEMS: ROS   Positive for: Musculoskeletal, Endocrine, Cardiovascular, Eyes Negative for: Constitutional, Gastrointestinal, Neurological, Skin, Genitourinary, HENT, Respiratory, Psychiatric, Allergic/Imm, Heme/Lymph Last edited by Elnor Avelina RAMAN, COT on 02/18/2024  1:28 PM.  ALLERGIES Allergies  Allergen Reactions   Penicillins Itching and Other (See Comments)    redness   Strawberry (Diagnostic) Itching and Swelling   Tomato Itching and Swelling   PAST MEDICAL HISTORY Past Medical History:  Diagnosis Date   Anemia    Anemia of chronic renal failure, stage 4 (severe) (HCC) 07/08/2023   Arthritis    Chest pain 05/07/2021   CHF (congestive heart failure) (HCC)    Chronic kidney disease    dialysis M-W-F   Depression    Diabetes mellitus    Headache    Hypercholesteremia    Hypertension     Hypertensive emergency 06/16/2022   Lung cancer (HCC) 12/18/2021   RUL small cell carcinoma, s/p radiation   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   Postop check 08/05/2022   Pseudogout    Stroke (HCC) 10/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   Tamponade 06/18/2022   Tobacco abuse    Past Surgical History:  Procedure Laterality Date   ANTERIOR CRUCIATE LIGAMENT REPAIR Right    AV FISTULA PLACEMENT Left 07/01/2022   Procedure: LEFT ARTERIOVENOUS (AV) FISTULA CREATION;  Surgeon: Eliza Lonni RAMAN, MD;  Location: [facility] OR;  Service: Vascular;  Laterality: Left;  with regional block   BRONCHIAL BIOPSY  12/18/2021   Procedure: BRONCHIAL BIOPSIES;  Surgeon: Brenna Adine CROME, DO;  Location: [facility] ENDOSCOPY;  Service: Pulmonary;;   BRONCHIAL BRUSHINGS  12/18/2021   Procedure: BRONCHIAL BRUSHINGS;  Surgeon: Brenna Adine CROME, DO;  Location: [facility];  Service: Pulmonary;;   BRONCHIAL NEEDLE ASPIRATION BIOPSY  12/18/2021   Procedure: BRONCHIAL NEEDLE ASPIRATION BIOPSIES;  Surgeon: Brenna Adine CROME, DO;  Location: [facility];  Service: Pulmonary;;   CATARACT EXTRACTION     CHEST TUBE INSERTION Right 06/18/2022   Procedure: CHEST TUBE INSERTION;  Surgeon: Obadiah Coy, MD;  Location: [facility] OR;  Service: Thoracic;  Laterality: Right;   EXCHANGE OF A DIALYSIS CATHETER N/A 08/08/2022   Procedure: EXCHANGE OF A TUNNELED DIALYSIS CATHETER;  Surgeon: Serene Gaile ORN, MD;  Location: [facility] OR;  Service: Vascular;  Laterality: N/A;   FIDUCIAL MARKER PLACEMENT  12/18/2021   Procedure: FIDUCIAL MARKER PLACEMENT;  Surgeon: Brenna Adine CROME, DO;  Location: [facility];  Service: Pulmonary;;   IR FLUORO GUIDE CV LINE RIGHT  06/26/2022   IR US  GUIDE VASC ACCESS RIGHT  06/26/2022   OVARY SURGERY     RIGHT OOPHORECTOMY Right    SMALL INTESTINE SURGERY     SUBXYPHOID PERICARDIAL WINDOW N/A 06/18/2022   Procedure: SUBXYPHOID PERICARDIAL WINDOW;  Surgeon: Obadiah Coy, MD;  Location: [facility] OR;  Service: Thoracic;  Laterality: N/A;    TEE WITHOUT CARDIOVERSION N/A 06/18/2022   Procedure: TRANSESOPHAGEAL ECHOCARDIOGRAM (TEE);  Surgeon: Obadiah Coy, MD;  Location: [facility] OR;  Service: Thoracic;  Laterality: N/A;   TUBAL LIGATION     VIDEO BRONCHOSCOPY WITH RADIAL ENDOBRONCHIAL ULTRASOUND  12/18/2021   Procedure: RADIAL ENDOBRONCHIAL ULTRASOUND;  Surgeon: Brenna Adine CROME, DO;  Location: [facility];  Service: Pulmonary;;   FAMILY HISTORY Family History  Problem Relation Age of Onset   Diabetes Mother    Hypertension Mother    Hypertension Father    SOCIAL HISTORY Social History   Tobacco Use   Smoking status: Every Day    Current packs/day: 0.50    Average packs/day: 0.5 packs/day for 40.0 years (20.0 ttl pk-yrs)    Types: Cigarettes   Smokeless tobacco: Never   Tobacco comments:    Cutting back 1/4-1/2 ppd  Vaping Use  Vaping status: Never Used  Substance Use Topics   Alcohol use: Not Currently   Drug use: Yes    Frequency: 4.0 times per week    Types: Marijuana    Comment: smokes every other day      OPHTHALMIC EXAM:  Base Eye Exam     Visual Acuity (Snellen - Linear)       Right Left   Dist [place] 20/40 +1 20/25 -2   Dist ph [place] NI NI         Tonometry (Tonopen, 1:33 PM)       Right Left   Pressure 14 13         Pupils       Pupils Dark Light Shape React APD   Right PERRL 3 2 Round Brisk None   Left PERRL 3 2 Round Brisk None         Visual Fields       Left Right    Full Full         Extraocular Movement       Right Left    Full, Ortho Full, Ortho         Neuro/Psych     Oriented x3: Yes   Mood/Affect: Normal         Dilation     Both eyes: 1.0% Mydriacyl, 2.5% Phenylephrine  @ 1:34 PM           Slit Lamp and Fundus Exam     Slit Lamp Exam       Right Left   Lids/Lashes Dermatochalasis - upper lid, mild MGD Dermatochalasis - upper lid, mild MGD   Conjunctiva/Sclera nasal pingeucula, Melanosis nasal pingeucula, mild Melanosis   Cornea arcus,  well healed cataract wound, nasal LRI, trace Punctate epithelial erosions arcus, trace Punctate epithelial erosions, trace tear film debris, well healed cataract wound, fine endo pigment   Anterior Chamber deep and clear deep and clear, no cell or flare   Iris Round and dilated Round and dilated   Lens PC IOL in good position PC IOL in good position   Anterior Vitreous mild syneresis, Posterior vitreous detachment mild syneresis         Fundus Exam       Right Left   Disc Pink and Sharp, temporal pallor, temporal PPP Pink and Sharp, temporal pallor, temporal PPP, no NVD   C/D Ratio 0.3 0.3   Macula Good foveal reflex, scattered MA, DBH and punctate exudates -- improved, +cystic changes -- improved good foveal reflex, dense central cluster of exudates -- improved, scattered MA, DBH and central edema -- improved   Vessels attenuated, Tortuous, no NV attenuated, Tortuous, no NV   Periphery Attached, scattered MA Attached, rare MA           IMAGING AND PROCEDURES  Imaging and Procedures for 02/18/2024  OCT, Retina - OU - Both Eyes       Right Eye Quality was good. Central Foveal Thickness: 225. Progression has improved. Findings include normal foveal contour, no SRF, intraretinal hyper-reflective material, intraretinal fluid, pigment epithelial detachment (persistent IRF / IRHM greatest temporal and superior mac -- slightly improved).   Left Eye Quality was good. Central Foveal Thickness: 183. Progression has improved. Findings include no SRF, abnormal foveal contour, subretinal hyper-reflective material, intraretinal hyper-reflective material, intraretinal fluid, outer retinal atrophy (Persistent IRF/IRHM greatest IT macula -- slightly improved).   Notes *Images captured and stored on drive  Diagnosis / Impression:  DME OU OD: persistent IRF /  IRHM greatest temporal and superior mac -- slightly improved OS: Persistent IRF/IRHM greatest IT macula -- improved  Clinical  management:  See below  Abbreviations: NFP - Normal foveal profile. CME - cystoid macular edema. PED - pigment epithelial detachment. IRF - intraretinal fluid. SRF - subretinal fluid. EZ - ellipsoid zone. ERM - epiretinal membrane. ORA - outer retinal atrophy. ORT - outer retinal tubulation. SRHM - subretinal hyper-reflective material. IRHM - intraretinal hyper-reflective material      Intravitreal Injection, Pharmacologic Agent - OD - Right Eye       Time Out 02/18/2024. 2:24 PM. Confirmed correct patient, procedure, site, and patient consented.   Anesthesia Topical anesthesia was used. Anesthetic medications included Lidocaine  2%, Proparacaine 0.5%.   Procedure Preparation included 5% betadine to ocular surface, eyelid speculum. A (32g) needle was used.   Injection: 2 mg aflibercept  2 MG/0.05ML   Route: Intravitreal, Site: Right Eye   NDC: D2246706, Lot: [id number], Expiration date: 05/27/2025, Waste: 0 mL   Post-op Post injection exam found visual acuity of at least counting fingers. The patient tolerated the procedure well. There were no complications. The patient received written and verbal post procedure care education.      Intravitreal Injection, Pharmacologic Agent - OS - Left Eye       Time Out 02/18/2024. 2:33 PM. Confirmed correct patient, procedure, site, and patient consented.   Anesthesia Topical anesthesia was used. Anesthetic medications included Lidocaine  2%, Proparacaine 0.5%.   Procedure Preparation included 5% betadine to ocular surface, eyelid speculum. A (32g) needle was used.   Injection: 2 mg aflibercept  2 MG/0.05ML   Route: Intravitreal, Site: Left Eye   NDC: D2246706, Lot: [id number], Expiration date: 04/27/2025, Waste: 0 mL   Post-op Post injection exam found visual acuity of at least counting fingers. The patient tolerated the procedure well. There were no complications. The patient received written and verbal post procedure care  education.           ASSESSMENT/PLAN:   ICD-10-CM   1. Severe nonproliferative diabetic retinopathy of both eyes with macular edema associated with type 2 diabetes mellitus (HCC)  E11.3413 OCT, Retina - OU - Both Eyes    Intravitreal Injection, Pharmacologic Agent - OD - Right Eye    Intravitreal Injection, Pharmacologic Agent - OS - Left Eye    aflibercept  (EYLEA ) SOLN 2 mg    aflibercept  (EYLEA ) SOLN 2 mg    2. Current use of insulin  (HCC)  Z79.4     3. Essential hypertension  I10     4. Hypertensive retinopathy of both eyes  H35.033     5. Pseudophakia, both eyes  Z96.1      1,2 Severe Non-proliferative diabetic retinopathy, both eyes  - A1C 7.8 (08.13.24), 10.4 (05.09.24), 6.7 (11.30.23) - s/p IVA OD #1 (05.02.24), #2 (05.30.24), #3 (07.24.24), #4 (08.21.24), #5 (10.08.24), #6 (11.05.24), #7 (12.10.24), #8 (01.14.25), #9 (02.18.25), #10 (03.25.25), #11 (04.29.25), #12 (06.03.25) - s/p IVA OS #1 (04.04.24), #2 (05.02.24), #3 (05.30.24), #4 (07.24.24), #5 (08.21.24) -- IVA resistance OS - s/p IVE OS #1 (10.08.24), #2 (11.05.24), #3 (12.10.24), #4 (01.14.25), #5 (02.18.25), #6 (03.25.25), #7 (04.29.25), #8 (06.03.25) - BCVA OD 20/40 stable from prior, OS 20/25 from 20/40 - exam shows scattered MA/DBH and exudates OU (OS > OD) - FA (04.04.24) shows leaking MA greatest posteriorly, no NV OU - OCT shows  OD: persistent IRF / IRHM greatest temporal and superior mac--slightly improved; OS: Persistent IRF/IRHM greatest IT macula--slightly improved at 5 weeks - recommend  IVE OD #1 and IVE OS #9 today, 07.23.25 for DME w/ f/u in 5 wks again - pt wishes to proceed - RBA of procedure discussed, questions answered - Eylea  approved with insurance - IVA informed consent obtained and signed, 04.04.24 (OU) - IVE informed consent obtained and signed, 10.08.24 (OU) - see procedure note - f/u in 5 wks -- DFE/OCT, possible injection  3,4. Hypertensive retinopathy OU - discussed importance of  tight BP control - monitor  5. Pseudophakia OU  - s/p CE/IOL OU (Dr. Octavia, OD: 2017, OS: 03.04.25)  - IOL in good position, doing well  - monitor  Ophthalmic Meds Ordered this visit:  Meds ordered this encounter  Medications   aflibercept  (EYLEA ) SOLN 2 mg   aflibercept  (EYLEA ) SOLN 2 mg    Return in about 5 weeks (around 03/24/2024) for NPDR OU, DFE, OCT, Possible Injxn.  There are no Patient Instructions on file for this visit.  Explained the diagnoses, plan, and follow up with the patient and they expressed understanding.  Patient expressed understanding of the importance of proper follow up care.   This document serves as a record of services personally performed by Redell JUDITHANN Hans, MD, PhD. It was created on their behalf by Almetta Pesa, an ophthalmic technician. The creation of this record is the provider's dictation and/or activities during the visit.    Electronically signed by: Almetta Pesa, OA, 02/22/24  11:28 PM   Redell JUDITHANN Hans, M.D., Ph.D. Diseases & Surgery of the Retina and Vitreous [facility]  I have reviewed the above documentation for accuracy and completeness, and I agree with the above. Redell JUDITHANN Hans, M.D., Ph.D. 02/22/24 11:36 PM   Abbreviations: M myopia (nearsighted); A astigmatism; H hyperopia (farsighted); P presbyopia; Mrx spectacle prescription;  CTL contact lenses; OD right eye; OS left eye; OU both eyes  XT exotropia; ET esotropia; PEK punctate epithelial keratitis; PEE punctate epithelial erosions; DES dry eye syndrome; MGD meibomian gland dysfunction; ATs artificial tears; PFAT's preservative free artificial tears; NSC nuclear sclerotic cataract; PSC posterior subcapsular cataract; ERM epi-retinal membrane; PVD posterior vitreous detachment; RD retinal detachment; DM diabetes mellitus; DR diabetic retinopathy; NPDR non-proliferative diabetic retinopathy; PDR proliferative diabetic retinopathy; CSME clinically  significant macular edema; DME diabetic macular edema; dbh dot blot hemorrhages; CWS cotton wool spot; POAG primary open angle glaucoma; C/D cup-to-disc ratio; HVF humphrey visual field; GVF goldmann visual field; OCT optical coherence tomography; IOP intraocular pressure; BRVO Branch retinal vein occlusion; CRVO central retinal vein occlusion; CRAO central retinal artery occlusion; BRAO branch retinal artery occlusion; RT retinal tear; SB scleral buckle; PPV pars plana vitrectomy; VH Vitreous hemorrhage; PRP panretinal laser photocoagulation; IVK intravitreal kenalog; VMT vitreomacular traction; MH Macular hole;  NVD neovascularization of the disc; NVE neovascularization elsewhere; AREDS age related eye disease study; ARMD age related macular degeneration; POAG primary open angle glaucoma; EBMD epithelial/anterior basement membrane dystrophy; ACIOL anterior chamber intraocular lens; IOL intraocular lens; PCIOL posterior chamber intraocular lens; Phaco/IOL phacoemulsification with intraocular lens placement; PRK photorefractive keratectomy; LASIK laser assisted in situ keratomileusis; HTN hypertension; DM diabetes mellitus; COPD chronic obstructive pulmonary disease

## 2024-02-17 ENCOUNTER — Other Ambulatory Visit

## 2024-02-17 ENCOUNTER — Ambulatory Visit: Admitting: Hematology & Oncology

## 2024-02-17 ENCOUNTER — Encounter: Payer: Self-pay | Admitting: Physician Assistant

## 2024-02-17 ENCOUNTER — Ambulatory Visit

## 2024-02-17 ENCOUNTER — Ambulatory Visit: Admitting: Physician Assistant

## 2024-02-17 VITALS — BP 170/65 | HR 94 | Ht 67.0 in | Wt 123.0 lb

## 2024-02-17 DIAGNOSIS — G4700 Insomnia, unspecified: Secondary | ICD-10-CM

## 2024-02-17 DIAGNOSIS — C7931 Secondary malignant neoplasm of brain: Secondary | ICD-10-CM

## 2024-02-17 DIAGNOSIS — E1140 Type 2 diabetes mellitus with diabetic neuropathy, unspecified: Secondary | ICD-10-CM | POA: Diagnosis not present

## 2024-02-17 DIAGNOSIS — Z510 Encounter for antineoplastic radiation therapy: Secondary | ICD-10-CM | POA: Diagnosis not present

## 2024-02-17 DIAGNOSIS — C3490 Malignant neoplasm of unspecified part of unspecified bronchus or lung: Secondary | ICD-10-CM | POA: Diagnosis not present

## 2024-02-17 DIAGNOSIS — E1142 Type 2 diabetes mellitus with diabetic polyneuropathy: Secondary | ICD-10-CM | POA: Diagnosis not present

## 2024-02-17 MED ORDER — ATORVASTATIN CALCIUM 80 MG PO TABS: 80.0000 mg | ORAL_TABLET | Freq: Every day | ORAL | 3 refills | Status: AC

## 2024-02-17 MED ORDER — GABAPENTIN 100 MG PO CAPS: 100.0000 mg | ORAL_CAPSULE | Freq: Every day | ORAL | 0 refills | Status: DC

## 2024-02-17 NOTE — Progress Notes (Signed)
 Established patient visit   Patient: Amber Cross   DOB: 02-19-1961   63 y.o. Female  MRN: [id number] Visit Date: 02/17/2024  Today's healthcare provider: Manuelita Flatness, PA-C   Cc. Hospital f/u  Subjective    Pt reports for a hospital f/u today.   She was admitted for acute encephalopathy 2/2 to brain mets from lung cancer 6/16-6/19.  She is receiving brain radiation, starting chemo again at the end of the month.  Still dialysis x 3 week.   Discussed the use of AI scribe software for clinical note transcription with the patient, who gave verbal consent to proceed.  She experiences significant fatigue, which she attributes to her busy schedule of medical appointments, including dialysis three times a week and eye injections.  She experiences sleep disturbances, as trazodone  taken at 9 PM does not maintain sleep through the night. She wakes around 1 AM and remains awake until her dialysis appointment at 5:30 AM.  She expresses a desire to return to physical activity, acknowledging the fatigue from radiation and upcoming chemotherapy.      Medications: Outpatient Medications Prior to Visit  Medication Sig   amLODipine  (NORVASC ) 10 MG tablet Take 10 mg by mouth daily.   amlodipine -olmesartan  (AZOR ) 10-20 MG tablet Take 1 tablet by mouth at bedtime.   Calcium  Acetate 668 (169 Ca) MG TABS Take 1 tablet by mouth 3 (three) times daily.   Doxercalciferol  (HECTOROL  IV) Take 3 mcg by mouth 3 (three) times a week.   dronabinol  (MARINOL ) 2.5 MG capsule Take 1 capsule (2.5 mg total) by mouth 2 (two) times daily before a meal. (Patient not taking: Reported on 01/23/2024)   fluticasone  (FLONASE ) 50 MCG/ACT nasal spray SPRAY 2 SPRAYS INTO EACH NOSTRIL EVERY DAY   furosemide  (LASIX ) 40 MG tablet Take 1 tablet (40 mg total) by mouth daily as needed (swelling in legs or weight gain).   hydrALAZINE  (APRESOLINE ) 100 MG tablet Take 1 tablet (100 mg total) by mouth 2 (two) times daily.    Lancet Devices (ADJUSTABLE LANCING DEVICE) MISC    megestrol  (MEGACE  ES) 625 MG/5ML suspension Take 5 mLs (625 mg total) by mouth daily. (Patient not taking: Reported on 01/23/2024)   pantoprazole  (PROTONIX ) 40 MG tablet Take 40 mg by mouth daily.   prednisoLONE acetate (PRED FORTE) 1 % ophthalmic suspension Place 1 drop into the left eye 4 (four) times daily. (Patient not taking: Reported on 01/23/2024)   sevelamer  carbonate (RENVELA ) 800 MG tablet Take 800 mg by mouth 3 (three) times daily with meals.   traZODone  (DESYREL ) 50 MG tablet Take 1 tablet (50 mg total) by mouth at bedtime.   [DISCONTINUED] atorvastatin  (LIPITOR ) 80 MG tablet Take 1 tablet (80 mg total) by mouth daily.   [DISCONTINUED] gabapentin  (NEURONTIN ) 600 MG tablet Take 600 mg by mouth daily.   No facility-administered medications prior to visit.    Review of Systems  Constitutional:  Positive for fatigue. Negative for fever.  Respiratory:  Negative for cough and shortness of breath.   Cardiovascular:  Negative for chest pain and leg swelling.  Gastrointestinal:  Negative for abdominal pain.  Neurological:  Negative for dizziness and headaches.       Objective    BP (!) 170/65   Pulse 94   Ht 5' 7 (1.702 m)   Wt 123 lb (55.8 kg)   LMP 09/29/2011   BMI 19.26 kg/m    Physical Exam Constitutional:      General: She is awake.  Appearance: She is well-developed.  HENT:     Head: Normocephalic.  Eyes:     Conjunctiva/sclera: Conjunctivae normal.  Cardiovascular:     Rate and Rhythm: Normal rate and regular rhythm.     Heart sounds: Murmur heard.  Pulmonary:     Effort: Pulmonary effort is normal.     Breath sounds: Normal breath sounds.  Skin:    General: Skin is warm.  Neurological:     Mental Status: She is alert and oriented to person, place, and time.  Psychiatric:        Attention and Perception: Attention normal.        Mood and Affect: Mood normal.        Speech: Speech normal.         Behavior: Behavior is cooperative.      No results found for any visits on 02/17/24.  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  Plan for chemo.  All things considered, feels well, fatigued.  Type 2 diabetes mellitus with peripheral neuropathy (HCC) -     Atorvastatin  Calcium ; Take 1 tablet (80 mg total) by mouth daily.  Dispense: 90 tablet; Refill: 3  Neuropathy due to type 2 diabetes mellitus (HCC) -     Gabapentin ; Take 1 capsule (100 mg total) by mouth daily.  Dispense: 90 capsule; Refill: 0  Insomnia Ok to gradually increase trazodone  dose. Currently on 50 mg -- increase to 75 mg at bedtime.     Return if symptoms worsen or fail to improve.       Manuelita Flatness, PA-C  [facility] [phone] (phone) [phone] (fax)  [facility]

## 2024-02-18 ENCOUNTER — Encounter (INDEPENDENT_AMBULATORY_CARE_PROVIDER_SITE_OTHER): Payer: Self-pay | Admitting: Ophthalmology

## 2024-02-18 ENCOUNTER — Ambulatory Visit (INDEPENDENT_AMBULATORY_CARE_PROVIDER_SITE_OTHER): Admitting: Ophthalmology

## 2024-02-18 DIAGNOSIS — E113413 Type 2 diabetes mellitus with severe nonproliferative diabetic retinopathy with macular edema, bilateral: Secondary | ICD-10-CM | POA: Diagnosis not present

## 2024-02-18 DIAGNOSIS — Z961 Presence of intraocular lens: Secondary | ICD-10-CM

## 2024-02-18 DIAGNOSIS — E1122 Type 2 diabetes mellitus with diabetic chronic kidney disease: Secondary | ICD-10-CM | POA: Diagnosis not present

## 2024-02-18 DIAGNOSIS — Z992 Dependence on renal dialysis: Secondary | ICD-10-CM | POA: Diagnosis not present

## 2024-02-18 DIAGNOSIS — Z794 Long term (current) use of insulin: Secondary | ICD-10-CM

## 2024-02-18 DIAGNOSIS — I10 Essential (primary) hypertension: Secondary | ICD-10-CM | POA: Diagnosis not present

## 2024-02-18 DIAGNOSIS — N2581 Secondary hyperparathyroidism of renal origin: Secondary | ICD-10-CM | POA: Diagnosis not present

## 2024-02-18 DIAGNOSIS — H35033 Hypertensive retinopathy, bilateral: Secondary | ICD-10-CM

## 2024-02-18 DIAGNOSIS — C7931 Secondary malignant neoplasm of brain: Secondary | ICD-10-CM | POA: Diagnosis not present

## 2024-02-18 DIAGNOSIS — C3490 Malignant neoplasm of unspecified part of unspecified bronchus or lung: Secondary | ICD-10-CM | POA: Diagnosis not present

## 2024-02-18 DIAGNOSIS — N186 End stage renal disease: Secondary | ICD-10-CM | POA: Diagnosis not present

## 2024-02-18 DIAGNOSIS — Z510 Encounter for antineoplastic radiation therapy: Secondary | ICD-10-CM | POA: Diagnosis not present

## 2024-02-18 MED ORDER — AFLIBERCEPT 2MG/0.05ML IZ SOLN FOR KALEIDOSCOPE
2.0000 mg | INTRAVITREAL | Status: AC | PRN
  Administered 2024-02-18: 2 mg via INTRAVITREAL

## 2024-02-19 ENCOUNTER — Other Ambulatory Visit: Payer: Self-pay

## 2024-02-20 DIAGNOSIS — E1122 Type 2 diabetes mellitus with diabetic chronic kidney disease: Secondary | ICD-10-CM | POA: Diagnosis not present

## 2024-02-20 DIAGNOSIS — N2581 Secondary hyperparathyroidism of renal origin: Secondary | ICD-10-CM | POA: Diagnosis not present

## 2024-02-20 DIAGNOSIS — Z992 Dependence on renal dialysis: Secondary | ICD-10-CM | POA: Diagnosis not present

## 2024-02-20 DIAGNOSIS — N186 End stage renal disease: Secondary | ICD-10-CM | POA: Diagnosis not present

## 2024-02-23 DIAGNOSIS — E1122 Type 2 diabetes mellitus with diabetic chronic kidney disease: Secondary | ICD-10-CM | POA: Diagnosis not present

## 2024-02-23 DIAGNOSIS — N186 End stage renal disease: Secondary | ICD-10-CM | POA: Diagnosis not present

## 2024-02-23 DIAGNOSIS — N2581 Secondary hyperparathyroidism of renal origin: Secondary | ICD-10-CM | POA: Diagnosis not present

## 2024-02-23 DIAGNOSIS — Z992 Dependence on renal dialysis: Secondary | ICD-10-CM | POA: Diagnosis not present

## 2024-02-24 NOTE — Telephone Encounter (Signed)
 Amber Cross

## 2024-02-25 DIAGNOSIS — N186 End stage renal disease: Secondary | ICD-10-CM | POA: Diagnosis not present

## 2024-02-25 DIAGNOSIS — E1122 Type 2 diabetes mellitus with diabetic chronic kidney disease: Secondary | ICD-10-CM | POA: Diagnosis not present

## 2024-02-25 DIAGNOSIS — N2581 Secondary hyperparathyroidism of renal origin: Secondary | ICD-10-CM | POA: Diagnosis not present

## 2024-02-25 DIAGNOSIS — Z992 Dependence on renal dialysis: Secondary | ICD-10-CM | POA: Diagnosis not present

## 2024-02-26 ENCOUNTER — Inpatient Hospital Stay: Admitting: Hematology & Oncology

## 2024-02-26 ENCOUNTER — Inpatient Hospital Stay

## 2024-02-26 DIAGNOSIS — I129 Hypertensive chronic kidney disease with stage 1 through stage 4 chronic kidney disease, or unspecified chronic kidney disease: Secondary | ICD-10-CM | POA: Diagnosis not present

## 2024-02-26 DIAGNOSIS — Z992 Dependence on renal dialysis: Secondary | ICD-10-CM | POA: Diagnosis not present

## 2024-02-26 DIAGNOSIS — C3411 Malignant neoplasm of upper lobe, right bronchus or lung: Secondary | ICD-10-CM

## 2024-02-26 DIAGNOSIS — N186 End stage renal disease: Secondary | ICD-10-CM | POA: Diagnosis not present

## 2024-02-27 DIAGNOSIS — Z992 Dependence on renal dialysis: Secondary | ICD-10-CM | POA: Diagnosis not present

## 2024-02-27 DIAGNOSIS — N2581 Secondary hyperparathyroidism of renal origin: Secondary | ICD-10-CM | POA: Diagnosis not present

## 2024-02-27 DIAGNOSIS — E1122 Type 2 diabetes mellitus with diabetic chronic kidney disease: Secondary | ICD-10-CM | POA: Diagnosis not present

## 2024-02-27 DIAGNOSIS — N186 End stage renal disease: Secondary | ICD-10-CM | POA: Diagnosis not present

## 2024-03-01 DIAGNOSIS — E1122 Type 2 diabetes mellitus with diabetic chronic kidney disease: Secondary | ICD-10-CM | POA: Diagnosis not present

## 2024-03-03 ENCOUNTER — Other Ambulatory Visit: Payer: Self-pay | Admitting: Medical Oncology

## 2024-03-03 DIAGNOSIS — E1122 Type 2 diabetes mellitus with diabetic chronic kidney disease: Secondary | ICD-10-CM | POA: Diagnosis not present

## 2024-03-04 ENCOUNTER — Inpatient Hospital Stay: Attending: Radiation Oncology

## 2024-03-04 ENCOUNTER — Other Ambulatory Visit: Payer: Self-pay

## 2024-03-04 ENCOUNTER — Inpatient Hospital Stay

## 2024-03-04 ENCOUNTER — Inpatient Hospital Stay (HOSPITAL_BASED_OUTPATIENT_CLINIC_OR_DEPARTMENT_OTHER): Admitting: Hematology & Oncology

## 2024-03-04 VITALS — BP 153/56 | HR 82 | Temp 98.3°F | Resp 18 | Ht 67.0 in | Wt 116.0 lb

## 2024-03-04 VITALS — BP 166/87 | HR 85 | Resp 20

## 2024-03-04 DIAGNOSIS — R634 Abnormal weight loss: Secondary | ICD-10-CM | POA: Diagnosis not present

## 2024-03-04 DIAGNOSIS — Z923 Personal history of irradiation: Secondary | ICD-10-CM | POA: Diagnosis not present

## 2024-03-04 DIAGNOSIS — N186 End stage renal disease: Secondary | ICD-10-CM | POA: Diagnosis not present

## 2024-03-04 DIAGNOSIS — C3411 Malignant neoplasm of upper lobe, right bronchus or lung: Secondary | ICD-10-CM

## 2024-03-04 DIAGNOSIS — D631 Anemia in chronic kidney disease: Secondary | ICD-10-CM | POA: Diagnosis not present

## 2024-03-04 DIAGNOSIS — Z5112 Encounter for antineoplastic immunotherapy: Secondary | ICD-10-CM | POA: Diagnosis not present

## 2024-03-04 DIAGNOSIS — Z79620 Long term (current) use of immunosuppressive biologic: Secondary | ICD-10-CM | POA: Insufficient documentation

## 2024-03-04 DIAGNOSIS — C7931 Secondary malignant neoplasm of brain: Secondary | ICD-10-CM | POA: Diagnosis not present

## 2024-03-04 DIAGNOSIS — C3490 Malignant neoplasm of unspecified part of unspecified bronchus or lung: Secondary | ICD-10-CM | POA: Insufficient documentation

## 2024-03-04 LAB — CBC WITH DIFFERENTIAL (CANCER CENTER ONLY)
Abs Immature Granulocytes: 0.01 K/uL (ref 0.00–0.07)
Basophils Absolute: 0 K/uL (ref 0.0–0.1)
Basophils Relative: 0 %
Eosinophils Absolute: 0.1 K/uL (ref 0.0–0.5)
Eosinophils Relative: 2 %
HCT: 36.1 % (ref 36.0–46.0)
Hemoglobin: 11.2 g/dL — ABNORMAL LOW (ref 12.0–15.0)
Immature Granulocytes: 0 %
Lymphocytes Relative: 17 %
Lymphs Abs: 0.9 K/uL (ref 0.7–4.0)
MCH: 30.9 pg (ref 26.0–34.0)
MCHC: 31 g/dL (ref 30.0–36.0)
MCV: 99.4 fL (ref 80.0–100.0)
Monocytes Absolute: 0.4 K/uL (ref 0.1–1.0)
Monocytes Relative: 7 %
Neutro Abs: 4 K/uL (ref 1.7–7.7)
Neutrophils Relative %: 74 %
Platelet Count: 148 K/uL — ABNORMAL LOW (ref 150–400)
RBC: 3.63 MIL/uL — ABNORMAL LOW (ref 3.87–5.11)
RDW: 15.9 % — ABNORMAL HIGH (ref 11.5–15.5)
WBC Count: 5.5 K/uL (ref 4.0–10.5)
nRBC: 0 % (ref 0.0–0.2)

## 2024-03-04 LAB — CMP (CANCER CENTER ONLY)
ALT: 8 U/L (ref 0–44)
AST: 15 U/L (ref 15–41)
Albumin: 4.1 g/dL (ref 3.5–5.0)
Alkaline Phosphatase: 85 U/L (ref 38–126)
Anion gap: 15 (ref 5–15)
BUN: 22 mg/dL (ref 8–23)
CO2: 28 mmol/L (ref 22–32)
Calcium: 9.6 mg/dL (ref 8.9–10.3)
Chloride: 98 mmol/L (ref 98–111)
Creatinine: 5.09 mg/dL — ABNORMAL HIGH (ref 0.44–1.00)
GFR, Estimated: 9 mL/min — ABNORMAL LOW (ref >60)
Glucose, Bld: 132 mg/dL — ABNORMAL HIGH (ref 70–99)
Potassium: 3.8 mmol/L (ref 3.5–5.1)
Sodium: 141 mmol/L (ref 135–145)
Total Bilirubin: 0.2 mg/dL (ref 0.0–1.2)
Total Protein: 6.9 g/dL (ref 6.5–8.1)

## 2024-03-04 LAB — LACTATE DEHYDROGENASE: LDH: 210 U/L — ABNORMAL HIGH (ref 98–192)

## 2024-03-04 LAB — TSH: TSH: 0.32 u[IU]/mL — ABNORMAL LOW (ref 0.350–4.500)

## 2024-03-04 LAB — SAMPLE TO BLOOD BANK

## 2024-03-04 MED ORDER — PREDNISONE 20 MG PO TABS: 20.0000 mg | ORAL_TABLET | Freq: Every day | ORAL | 5 refills | Status: DC

## 2024-03-04 MED ORDER — SODIUM CHLORIDE 0.9 % IV SOLN
1200.0000 mg | Freq: Once | INTRAVENOUS | Status: AC
  Administered 2024-03-04: 1200 mg via INTRAVENOUS
  Filled 2024-03-04: qty 20

## 2024-03-04 MED ORDER — SODIUM CHLORIDE 0.9 % IV SOLN
Freq: Once | INTRAVENOUS | Status: AC
Start: 2024-03-04 — End: 2024-03-04

## 2024-03-04 MED ORDER — DRONABINOL 10 MG PO CAPS: 10.0000 mg | ORAL_CAPSULE | Freq: Every day | ORAL | 0 refills | Status: AC

## 2024-03-04 NOTE — Progress Notes (Signed)
 OK to treat with creat-5.09 per order of Dr. Timmy.

## 2024-03-04 NOTE — Progress Notes (Signed)
 Hematology and Oncology Follow Up Visit  Amber Cross [id number] [birth year] 63 y.o. 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   Prior Therapy:            Status post radiosurgery-completed on 01/31/2022 Carboplatinum/etoposide /Tecentriq  --s/p cycle #4 -- start on 05/07/2023 CNS radiotherapy-completed  at [facility] on 02/18/2024- -- 3000 Gy   Current Therapy: Tecentriq  1200 mg IV q 3 weeks - maintenance -- start on 09/23/2023  Aranesp  300 mcg subcu every 3 weeks for hemoglobin less than 11   Interim History:  Amber Cross is here today for follow-up and treatment.  Amber Cross has completed radiotherapy to the brain.  This was done at [facility].  I am not sure MD treatments Amber Cross received.  Unfortunately, we will concern is Amber Cross weight loss.  Amber Cross just will not eat.  We have Amber Cross on Marinol .  We have Amber Cross on Megace .  I am not sure if Amber Cross is taking either.  I will now try Amber Cross on prednisone  (20 mg p.o. daily) and hopefully Amber Cross will begin to eat a little bit more.  Amber Cross is not having any problems with pain.  Amber Cross is having no issues with bowels or bladder.  There is no bleeding.  Amber Cross has had no leg swelling..  Amber Cross really hates dialysis.  Amber Cross goes Monday-Wednesday-Friday.  It just really wears Amber Cross out.  Overall, I would have to say that Amber Cross performance status is probably ECOG 2.     Medications:  Allergies as of 03/04/2024       Reactions   Penicillins Itching, Other (See Comments)   redness   Strawberry (diagnostic) Itching, Swelling   Tomato Itching, Swelling        Medication List        Accurate as of March 04, 2024 10:14 AM. If you have any questions, ask your nurse or doctor.          STOP taking these medications    megestrol  625 MG/5ML suspension Commonly known as: MEGACE  ES Stopped by: Maude JONELLE Crease       TAKE  these medications    Adjustable Lancing Device Misc   amLODipine  10 MG tablet Commonly known as: NORVASC  Take 10 mg by mouth daily.   amlodipine -olmesartan  10-20 MG tablet Commonly known as: AZOR  Take 1 tablet by mouth at bedtime.   atorvastatin  80 MG tablet Commonly known as: LIPITOR  Take 1 tablet (80 mg total) by mouth daily.   Calcium  Acetate 668 (169 Ca) MG Tabs Take 1 tablet by mouth 3 (three) times daily.   dronabinol  2.5 MG capsule Commonly known as: MARINOL  Take 1 capsule (2.5 mg total) by mouth 2 (two) times daily before a meal.   fluticasone  50 MCG/ACT nasal spray Commonly known as: FLONASE  SPRAY 2 SPRAYS INTO EACH NOSTRIL EVERY DAY   furosemide  40 MG tablet Commonly known as: Lasix  Take 1 tablet (40 mg total) by mouth daily as needed (swelling in legs or weight gain).   gabapentin  100 MG capsule Commonly known as: NEURONTIN  Take 1 capsule (100 mg total) by mouth daily.   HECTOROL  IV Take 3 mcg by mouth 3 (three) times a week.   hydrALAZINE  100 MG tablet Commonly known as: APRESOLINE  Take 1 tablet (100 mg total) by mouth 2 (two) times daily.   megestrol  40 MG tablet Commonly known as: MEGACE  Take 40 mg by mouth daily.  pantoprazole  40 MG tablet Commonly known as: PROTONIX  Take 40 mg by mouth daily.   prednisoLONE acetate 1 % ophthalmic suspension Commonly known as: PRED FORTE Place 1 drop into the left eye 4 (four) times daily.   sevelamer  carbonate 800 MG tablet Commonly known as: RENVELA  Take 800 mg by mouth 3 (three) times daily with meals.   traZODone  50 MG tablet Commonly known as: DESYREL  Take 1 tablet (50 mg total) by mouth at bedtime.        Allergies:  Allergies  Allergen Reactions   Penicillins Itching and Other (See Comments)    redness   Strawberry (Diagnostic) Itching and Swelling   Tomato Itching and Swelling    Past Medical History, Surgical history, Social history, and Family History were reviewed and  updated.  Review of Systems: Review of Systems  Constitutional:  Positive for malaise/fatigue.  HENT: Negative.    Eyes: Negative.   Respiratory:  Positive for shortness of breath.   Cardiovascular:  Positive for palpitations.  Gastrointestinal:  Positive for heartburn and nausea.  Genitourinary: Negative.   Musculoskeletal: Negative.   Skin: Negative.   Neurological: Negative.   Endo/Heme/Allergies: Negative.   Psychiatric/Behavioral: Negative.    All other systems reviewed and are negative.    Physical Exam:  height is 5' 7 (1.702 m) and weight is 116 lb (52.6 kg). Amber Cross oral temperature is 98.3 F (36.8 C). Amber Cross blood pressure is 153/56 (abnormal) and Amber Cross pulse is 82. Amber Cross respiration is 18 and oxygen saturation is 98%.   Wt Readings from Last 3 Encounters:  03/04/24 116 lb (52.6 kg)  02/17/24 123 lb (55.8 kg)  02/12/24 129 lb (58.5 kg)    Physical Exam Vitals reviewed.  HENT:     Head: Normocephalic and atraumatic.  Eyes:     Pupils: Pupils are equal, round, and reactive to light.  Cardiovascular:     Rate and Rhythm: Normal rate and regular rhythm.     Heart sounds: Normal heart sounds.  Pulmonary:     Effort: Pulmonary effort is normal.     Breath sounds: Normal breath sounds.  Abdominal:     General: Bowel sounds are normal.     Palpations: Abdomen is soft.  Musculoskeletal:        General: No tenderness or deformity. Normal range of motion.     Cervical back: Normal range of motion.  Lymphadenopathy:     Cervical: No cervical adenopathy.  Skin:    General: Skin is warm and dry.     Findings: No erythema or rash.  Neurological:     Mental Status: Amber Cross is alert and oriented to person, place, and time.  Psychiatric:        Behavior: Behavior normal.        Thought Content: Thought content normal.        Judgment: Judgment normal.      Lab Results  Component Value Date   WBC 5.5 03/04/2024   HGB 11.2 (L) 03/04/2024   HCT 36.1 03/04/2024   MCV 99.4  03/04/2024   PLT PENDING 03/04/2024   Lab Results  Component Value Date   FERRITIN 1,212 (H) 11/25/2023   IRON 119 11/25/2023   TIBC 281 11/25/2023   UIBC 162 11/25/2023   IRONPCTSAT 42 (H) 11/25/2023   Lab Results  Component Value Date   RETICCTPCT 1.9 01/08/2023   RBC 3.63 (L) 03/04/2024   Lab Results  Component Value Date   KPAFRELGTCHN 123.1 (H) 01/31/2022   LAMBDASER  36.3 (H) 01/31/2022   KAPLAMBRATIO 3.39 (H) 01/31/2022   Lab Results  Component Value Date   IGGSERUM 768 05/09/2021   IGA 150 05/09/2021   IGMSERUM 60 05/09/2021   Lab Results  Component Value Date   TOTALPROTELP 5.3 (L) 01/31/2022   ALBUMINELP 1.8 (L) 01/31/2022   A1GS 0.4 01/31/2022   A2GS 1.3 (H) 01/31/2022   BETS 1.0 01/31/2022   GAMS 0.8 01/31/2022   MSPIKE Not Observed 01/31/2022   SPEI Comment 01/31/2022     Chemistry      Component Value Date/Time   NA 140 01/22/2024 1204   NA 141 03/11/2023 1220   K 5.0 01/22/2024 1204   CL 101 01/22/2024 1204   CO2 27 01/22/2024 1204   BUN 43 (H) 01/22/2024 1204   BUN 26 03/11/2023 1220   CREATININE 5.90 (H) 01/22/2024 1204      Component Value Date/Time   CALCIUM  8.6 (L) 01/22/2024 1204   ALKPHOS 85 01/22/2024 1204   AST 8 (L) 01/22/2024 1204   ALT 11 01/22/2024 1204   BILITOT 0.3 01/22/2024 1204       Impression and Plan: Amber Cross is a very nice 63 year old African-American female.  Amber Cross has history of limited stage small cell lung cancer in addition to ESRD.  Unfortunately, because of Amber Cross overall health status, Amber Cross really could not have combined radiation therapy and chemotherapy.   I would like to think that the cranial radiotherapy has helped with Amber Cross CNS metastasis.  I still think we have to move ahead with Amber Cross immunotherapy for Amber Cross systemic disease.  This I think will be a maintenance type of therapy.  Again, the weight loss is clearly a problem.  I really hate that Amber Cross had the CNS metastasis.  I still think that we need to  move ahead with Amber Cross immunotherapy.  I know that radiation therapy will be able to help with the CNS disease.  Amber Cross last PET scan was done back in March.  We really need to get Amber Cross set up with another PET scan.  I know Amber Cross has to get these at [facility].  Again, the weight loss is going to really dictate how Amber Cross does, in my opinion.  Hopefully, the prednisone  will help.  Hopefully Amber Cross will take the Marinol .  I know that it is on backorder so this might be part of the problem.  We will plan to get Amber Cross back in 4 weeks.  I will try here next week off.   Maude JONELLE Crease, MD 8/7/202510:14 AM Right

## 2024-03-05 ENCOUNTER — Telehealth: Payer: Self-pay

## 2024-03-05 ENCOUNTER — Encounter: Payer: Self-pay | Admitting: Hematology & Oncology

## 2024-03-05 ENCOUNTER — Telehealth: Payer: Self-pay | Admitting: *Deleted

## 2024-03-05 NOTE — Progress Notes (Signed)
 Complex Care Management Note  Care Guide Note 03/05/2024 Name: Amber Cross MRN: [id number] DOB: 08/12/1960  [name] is a 63 y.o. year old female who sees Cyndi, Manuelita, PA-C for primary care. I reached out to [name] by phone today to offer complex care management services.  [name] was given information about Complex Care Management services today including:   The Complex Care Management services include support from the care team which includes your Nurse Care Manager, Clinical Social Worker, or Pharmacist.  The Complex Care Management team is here to help remove barriers to the health concerns and goals most important to you. Complex Care Management services are voluntary, and the patient may decline or stop services at any time by request to their care team member.   Complex Care Management Consent Status: Patient agreed to services and verbal consent obtained.   Follow up plan:  Telephone appointment with complex care management team member scheduled for:  03/11/24 at 3:00 p.m.   Encounter Outcome:  Patient Scheduled  [name]  [facility], [facility] Care Management Assistant Direct Dial: [phone]  Fax: [phone]

## 2024-03-05 NOTE — Telephone Encounter (Addendum)
 Medication Prior Authorization Status  Processed CoverMyMeds KEY: [id number] for Dronabinol  10 mg caps  Approved Today  Per [employer] Medicare Part D  PA Case ID: [id number]   Effective 03/03/2024 through 07/28/2024.

## 2024-03-08 DIAGNOSIS — N2581 Secondary hyperparathyroidism of renal origin: Secondary | ICD-10-CM | POA: Diagnosis not present

## 2024-03-08 DIAGNOSIS — Z992 Dependence on renal dialysis: Secondary | ICD-10-CM | POA: Diagnosis not present

## 2024-03-08 DIAGNOSIS — E1122 Type 2 diabetes mellitus with diabetic chronic kidney disease: Secondary | ICD-10-CM | POA: Diagnosis not present

## 2024-03-09 ENCOUNTER — Other Ambulatory Visit: Payer: Self-pay

## 2024-03-09 ENCOUNTER — Ambulatory Visit

## 2024-03-09 ENCOUNTER — Other Ambulatory Visit: Payer: Self-pay | Admitting: *Deleted

## 2024-03-09 ENCOUNTER — Inpatient Hospital Stay: Admitting: Dietician

## 2024-03-09 VITALS — Ht 67.0 in | Wt 116.0 lb

## 2024-03-09 DIAGNOSIS — Z1231 Encounter for screening mammogram for malignant neoplasm of breast: Secondary | ICD-10-CM

## 2024-03-09 DIAGNOSIS — Z0000 Encounter for general adult medical examination without abnormal findings: Secondary | ICD-10-CM

## 2024-03-09 DIAGNOSIS — Z1211 Encounter for screening for malignant neoplasm of colon: Secondary | ICD-10-CM

## 2024-03-09 MED ORDER — ONDANSETRON HCL 8 MG PO TABS: 8.0000 mg | ORAL_TABLET | Freq: Three times a day (TID) | ORAL | 0 refills | Status: DC | PRN

## 2024-03-09 NOTE — Patient Instructions (Addendum)
 Ms. Amber Cross , Thank you for taking time out of your busy schedule to complete your Annual Wellness Visit with me. I enjoyed our conversation and look forward to speaking with you again next year. I, as well as your care team,  appreciate your ongoing commitment to your health goals. Please review the following plan we discussed and let me know if I can assist you in the future. Your Game plan/ To Do List    Referrals: If you haven't heard from the office you've been referred to, please reach out to them at the phone provided.   Follow up Visits: We will see or speak with you next year for your Next Medicare AWV with our clinical staff 03/15/25 @ 1:50p  Have you seen your provider in the last 6 months (3 months if uncontrolled diabetes)?   Clinician Recommendations:  Aim for 30 minutes of exercise or brisk walking, 6-8 glasses of water, and 5 servings of fruits and vegetables each day.       This is a list of the screenings recommended for you:  Health Maintenance  Topic Date Due   Zoster (Shingles) Vaccine (1 of 2) Never done   Pap with HPV screening  Never done   Colon Cancer Screening  Never done   Mammogram  Never done   Pneumococcal Vaccine for high risk medical condition (2 of 2 - PCV) 08/27/2018   Pneumococcal Vaccine for age over 38 (2 of 2 - PCV) 08/27/2018   COVID-19 Vaccine (3 - Pfizer risk series) 12/11/2019   Complete foot exam   12/05/2023   Flu Shot  02/27/2024   Hemoglobin A1C  05/21/2024   Eye exam for diabetics  10/20/2024   Medicare Annual Wellness Visit  03/09/2025   DTaP/Tdap/Td vaccine (2 - Td or Tdap) 05/30/2031   Hepatitis C Screening  Completed   HIV Screening  Completed   Hepatitis B Vaccine  Aged Out   HPV Vaccine  Aged Out   Meningitis B Vaccine  Aged Out    Advanced directives: (In Chart) A copy of your advanced directives are scanned into your chart should your provider ever need it. Advance Care Planning is important because it:  [x]  Makes sure  you receive the medical care that is consistent with your values, goals, and preferences  [x]  It provides guidance to your family and loved ones and reduces their decisional burden about whether or not they are making the right decisions based on your wishes.  Follow the link provided in your after visit summary or read over the paperwork we have mailed to you to help you started getting your Advance Directives in place. If you need assistance in completing these, please reach out to us  so that we can help you!  See attachments for Preventive Care and Fall Prevention Tips.

## 2024-03-09 NOTE — Progress Notes (Signed)
 Subjective:   Amber Cross is a 63 y.o. who presents for a Medicare Wellness preventive visit.  As a reminder, Annual Wellness Visits don't include a physical exam, and some assessments may be limited, especially if this visit is performed virtually. We may recommend an in-person follow-up visit with your provider if needed.  Visit Complete: Virtual I connected with  Amber Cross on 03/09/24 by a audio enabled telemedicine application and verified that I am speaking with the correct person using two identifiers.  Patient Location: Home  Provider Location: Home Office  I discussed the limitations of evaluation and management by telemedicine. The patient expressed understanding and agreed to proceed.  Vital Signs: Because this visit was a virtual/telehealth visit, some criteria may be missing or patient reported. Any vitals not documented were not able to be obtained and vitals that have been documented are patient reported.    Persons Participating in Visit: Patient.  AWV Questionnaire: No: Patient Medicare AWV questionnaire was not completed prior to this visit.  Cardiac Risk Factors include: advanced age (>62men, >62 women);diabetes mellitus;hypertension     Objective:    Today's Vitals   03/09/24 1354 03/09/24 1355  Weight: 116 lb (52.6 kg)   Height: 5' 7 (1.702 m)   PainSc:  0-No pain   Body mass index is 18.17 kg/m.     03/09/2024    2:07 PM 03/04/2024    9:48 AM 01/22/2024   12:31 PM 01/22/2024   12:19 PM 01/06/2024   10:13 AM 12/16/2023    9:35 AM 11/04/2023   11:08 AM  Advanced Directives  Does Patient Have a Medical Advance Directive? Yes Yes Yes Yes Yes Yes Yes  Type of Estate agent of [place];Living will Living will  Living will Living will;Healthcare Power of [employer] Power of [place];Living will Living will  Does patient want to make changes to medical advance directive? No - Patient declined No - Patient declined No -  Patient declined No - Patient declined No - Patient declined  No - Patient declined  Copy of Healthcare Power of Attorney in Chart? Yes - validated most recent copy scanned in chart (See row information)     Yes - validated most recent copy scanned in chart (See row information)     Current Medications (verified) Outpatient Encounter Medications as of 03/09/2024  Medication Sig   amLODipine  (NORVASC ) 10 MG tablet Take 10 mg by mouth daily.   amlodipine -olmesartan  (AZOR ) 10-20 MG tablet Take 1 tablet by mouth at bedtime.   atorvastatin  (LIPITOR ) 80 MG tablet Take 1 tablet (80 mg total) by mouth daily.   Calcium  Acetate 668 (169 Ca) MG TABS Take 1 tablet by mouth 3 (three) times daily.   Doxercalciferol  (HECTOROL  IV) Take 3 mcg by mouth 3 (three) times a week.   dronabinol  (MARINOL ) 10 MG capsule Take 1 capsule (10 mg total) by mouth daily before lunch.   fluticasone  (FLONASE ) 50 MCG/ACT nasal spray SPRAY 2 SPRAYS INTO EACH NOSTRIL EVERY DAY   furosemide  (LASIX ) 40 MG tablet Take 1 tablet (40 mg total) by mouth daily as needed (swelling in legs or weight gain).   gabapentin  (NEURONTIN ) 100 MG capsule Take 1 capsule (100 mg total) by mouth daily.   hydrALAZINE  (APRESOLINE ) 100 MG tablet Take 1 tablet (100 mg total) by mouth 2 (two) times daily.   Lancet Devices (ADJUSTABLE LANCING DEVICE) MISC    pantoprazole  (PROTONIX ) 40 MG tablet Take 40 mg by mouth daily.   prednisoLONE acetate (  PRED FORTE) 1 % ophthalmic suspension Place 1 drop into the left eye 4 (four) times daily. (Patient not taking: Reported on 01/23/2024)   predniSONE  (DELTASONE ) 20 MG tablet Take 1 tablet (20 mg total) by mouth daily with breakfast.   sevelamer  carbonate (RENVELA ) 800 MG tablet Take 800 mg by mouth 3 (three) times daily with meals.   traZODone  (DESYREL ) 50 MG tablet Take 1 tablet (50 mg total) by mouth at bedtime.   No facility-administered encounter medications on file as of 03/09/2024.    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   Postop check 08/05/2022   Pseudogout    Stroke (HCC) 10/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   Tamponade 06/18/2022   Tobacco abuse    Past Surgical History:  Procedure Laterality Date   ANTERIOR CRUCIATE LIGAMENT REPAIR Right    AV FISTULA PLACEMENT Left 07/01/2022   Procedure: LEFT ARTERIOVENOUS (AV) FISTULA CREATION;  Surgeon: Eliza Lonni RAMAN, MD;  Location[facility];  Service: Vascular;  Laterality: Left;  with regional block   BRONCHIAL BIOPSY  12/18/2021   Procedure: BRONCHIAL BIOPSIES;  Surgeon: Brenna Adine CROME, DO;  Location: [facility] ENDOSCOPY;  Service: Pulmonary;;   BRONCHIAL BRUSHINGS  12/18/2021   Procedure: BRONCHIAL BRUSHINGS;  Surgeon: Brenna Adine CROME, DO;  Location: [facility];  Service: Pulmonary;;   BRONCHIAL NEEDLE ASPIRATION BIOPSY  12/18/2021   Procedure: BRONCHIAL NEEDLE ASPIRATION BIOPSIES;  Surgeon: Brenna Adine CROME, DO;  Location: [facility];  Service: Pulmonary;;   CATARACT EXTRACTION     CHEST TUBE INSERTION Right 06/18/2022   Procedure: CHEST TUBE INSERTION;  Surgeon: Obadiah Coy, MD;  Location: [facility] OR;  Service: Thoracic;  Laterality: Right;   EXCHANGE OF A DIALYSIS CATHETER N/A 08/08/2022   Procedure: EXCHANGE OF A TUNNELED DIALYSIS CATHETER;  Surgeon: Serene Gaile ORN, MD;  Location: [facility] OR;  Service: Vascular;  Laterality: N/A;   FIDUCIAL MARKER PLACEMENT  12/18/2021   Procedure: FIDUCIAL MARKER PLACEMENT;  Surgeon:  Brenna Adine CROME, DO;  Location: [facility];  Service: Pulmonary;;   IR FLUORO GUIDE CV LINE RIGHT  06/26/2022   IR US  GUIDE VASC ACCESS RIGHT  06/26/2022   OVARY SURGERY     RIGHT OOPHORECTOMY Right    SMALL INTESTINE SURGERY     SUBXYPHOID PERICARDIAL WINDOW N/A 06/18/2022   Procedure: SUBXYPHOID PERICARDIAL WINDOW;  Surgeon: Obadiah Coy, MD;  Location: [facility] OR;  Service: Thoracic;  Laterality: N/A;   TEE WITHOUT CARDIOVERSION N/A 06/18/2022   Procedure: TRANSESOPHAGEAL ECHOCARDIOGRAM (TEE);  Surgeon: Obadiah Coy, MD;  Location: [facility] OR;  Service: Thoracic;  Laterality: N/A;   TUBAL LIGATION     VIDEO BRONCHOSCOPY WITH RADIAL ENDOBRONCHIAL ULTRASOUND  12/18/2021   Procedure: RADIAL ENDOBRONCHIAL ULTRASOUND;  Surgeon: Brenna Adine CROME, DO;  Location: [facility];  Service: Pulmonary;;   Family History  Problem Relation Age of Onset   Diabetes Mother    Hypertension Mother  Hypertension Father    Social History   Socioeconomic History   Marital status: Married    Spouse name: Not on file   Number of children: Not on file   Years of education: Not on file   Highest education level: Not on file  Occupational History   Not on file  Tobacco Use   Smoking status: Every Day    Current packs/day: 0.50    Average packs/day: 0.5 packs/day for 40.0 years (20.0 ttl pk-yrs)    Types: Cigarettes   Smokeless tobacco: Never   Tobacco comments:    Cutting back 1/4-1/2 ppd  Vaping Use   Vaping status: Never Used  Substance and Sexual Activity   Alcohol use: Not Currently   Drug use: Yes    Frequency: 4.0 times per week    Types: Marijuana    Comment: smokes every other day   Sexual activity: Not Currently    Birth control/protection: Abstinence  Other Topics Concern   Not on file  Social History Narrative   Not on file   Social Drivers of Health   Financial Resource Strain: Low Risk  (03/09/2024)   Overall Financial Resource Strain (CARDIA)    Difficulty of Paying  Living Expenses: Not hard at all  Food Insecurity: No Food Insecurity (03/09/2024)   Hunger Vital Sign    Worried About Running Out of Food in the Last Year: Never true    Ran Out of Food in the Last Year: Never true  Transportation Needs: No Transportation Needs (03/09/2024)   PRAPARE - Administrator, Civil Service (Medical): No    Lack of Transportation (Non-Medical): No  Physical Activity: Inactive (03/09/2024)   Exercise Vital Sign    Days of Exercise per Week: 0 days    Minutes of Exercise per Session: 0 min  Stress: No Stress Concern Present (03/09/2024)   [employer] of Occupational Health - Occupational Stress Questionnaire    Feeling of Stress: Not at all  Social Connections: Socially Isolated (03/09/2024)   Social Connection and Isolation Panel    Frequency of Communication with Friends and Family: More than three times a week    Frequency of Social Gatherings with Friends and Family: More than three times a week    Attends Religious Services: Never    Database administrator or Organizations: No    Attends Engineer, structural: Never    Marital Status: Never married    Tobacco Counseling Ready to quit: Yes Counseling given: Yes Tobacco comments: Cutting back 1/4-1/2 ppd    Clinical Intake:  Pre-visit preparation completed: Yes  Pain : No/denies pain Pain Score: 0-No pain     BMI - recorded: 18.17 Nutritional Status: BMI <19  Underweight Nutritional Risks: None Diabetes: Yes CBG done?: No Did pt. bring in CBG monitor from home?: No  Lab Results  Component Value Date   HGBA1C 7.0 (H) 11/20/2023   HGBA1C 7.8 (A) 03/11/2023   HGBA1C 10.4 (A) 12/05/2022     How often do you need to have someone help you when you read instructions, pamphlets, or other written materials from your doctor or pharmacy?: 3 - Sometimes (Son assist)  Interpreter Needed?: No  Information entered by :: Rojelio Blush LPN   Activities of Daily Living      03/09/2024    2:03 PM 03/11/2023   11:56 AM  In your present state of health, do you have any difficulty performing the following activities:  Hearing? 0 0  Vision? 0 1  Difficulty concentrating or making decisions? 0 0  Walking or climbing stairs? 1 0  Comment Uses a Restaurant manager, fast food   Dressing or bathing? 1 0  Comment Aide assist   Doing errands, shopping? 1 0  Comment Aide and family Advice worker and eating ? N N  Using the Toilet? N N  In the past six months, have you accidently leaked urine? N N  Do you have problems with loss of bowel control? CINDERELLA CINDERELLA  Comment Wears Breifs. Pending medical attention   Managing your Medications? N N  Managing your Finances? N N  Housekeeping or managing your Housekeeping? N N    Patient Care Team: Cyndi Shaver, PA-C as PCP - General (Physician Assistant) Tobb, Kardie, DO as PCP - Cardiology (Cardiology) [place]  I have updated your Care Teams any recent Medical Services you may have received from other providers in the past year.     Assessment:   This is a routine wellness examination for Keyanni.  Hearing/Vision screen Hearing Screening - Comments:: Denies hearing difficulties   Vision Screening - Comments:: Wears rx glasses - up to date with routine eye exams with  Dr Octavia   Goals Addressed               This Visit's Progress     Increase physical activity (pt-stated)        Get more active       Depression Screen     03/09/2024    2:01 PM 03/04/2024   10:00 AM 11/20/2023   11:05 AM 04/08/2023   12:27 PM 03/11/2023   12:07 PM 03/11/2023   11:01 AM 12/05/2022   10:26 AM  PHQ 2/9 Scores  PHQ - 2 Score 0 4 4 2  0 0 0  PHQ- 9 Score 0 10 8 4  1      Fall Risk     03/09/2024    2:07 PM 01/23/2024   11:29 AM 11/20/2023   11:05 AM 03/11/2023   11:58 AM 03/11/2023   11:01 AM  Fall Risk   Falls in the past year? 0 0 0 0 0  Number falls in past yr: 0 0 0 0   Injury with Fall? 0 0 0 0   Risk  for fall due to : No Fall Risks  No Fall Risks Impaired balance/gait No Fall Risks  Follow up Falls evaluation completed Falls evaluation completed Falls evaluation completed Falls prevention discussed;Falls evaluation completed Falls evaluation completed    MEDICARE RISK AT HOME:  Medicare Risk at Home Any stairs in or around the home?: Yes If so, are there any without handrails?: No Home free of loose throw rugs in walkways, pet beds, electrical cords, etc?: Yes Adequate lighting in your home to reduce risk of falls?: Yes Life alert?: No Use of a cane, walker or w/c?: Yes Grab bars in the bathroom?: No Shower chair or bench in shower?: No Elevated toilet seat or a handicapped toilet?: No  TIMED UP AND GO:  Was the test performed?  No  Cognitive Function: 6CIT completed        03/09/2024    2:07 PM 03/11/2023   11:58 AM  6CIT Screen  What Year? 0 points 0 points  What month? 0 points 0 points  What time? 0 points 0 points  Count back from 20 0 points 0 points  Months in reverse 0 points 0 points  Repeat  phrase 0 points 0 points  Total Score 0 points 0 points    Immunizations Immunization History  Administered Date(s) Administered   Influenza, Seasonal, Injecte, Preservative Fre 04/08/2023   Influenza,inj,Quad PF,6+ Mos 07/03/2017, 04/16/2021   Influenza,inj,quad, With Preservative 07/03/2017, 04/16/2021   Influenza-Unspecified 07/03/2017   PFIZER(Purple Top)SARS-COV-2 Vaccination 10/23/2019, 11/13/2019   Pneumococcal Polysaccharide-23 08/27/2017   Tdap 05/29/2021    Screening Tests Health Maintenance  Topic Date Due   Zoster Vaccines- Shingrix (1 of 2) Never done   Cervical Cancer Screening (HPV/Pap Cotest)  Never done   Colonoscopy  Never done   MAMMOGRAM  Never done   Pneumococcal Vaccine: 19-49 Years (2 of 2 - PCV) 08/27/2018   Pneumococcal Vaccine: 50+ Years (2 of 2 - PCV) 08/27/2018   COVID-19 Vaccine (3 - Pfizer risk series) 12/11/2019   FOOT EXAM   12/05/2023   INFLUENZA VACCINE  02/27/2024   HEMOGLOBIN A1C  05/21/2024   OPHTHALMOLOGY EXAM  10/20/2024   Medicare Annual Wellness (AWV)  03/09/2025   DTaP/Tdap/Td (2 - Td or Tdap) 05/30/2031   Hepatitis C Screening  Completed   HIV Screening  Completed   Hepatitis B Vaccines  Aged Out   HPV VACCINES  Aged Out   Meningococcal B Vaccine  Aged Out    Health Maintenance  Health Maintenance Due  Topic Date Due   Zoster Vaccines- Shingrix (1 of 2) Never done   Cervical Cancer Screening (HPV/Pap Cotest)  Never done   Colonoscopy  Never done   MAMMOGRAM  Never done   Pneumococcal Vaccine: 19-49 Years (2 of 2 - PCV) 08/27/2018   Pneumococcal Vaccine: 50+ Years (2 of 2 - PCV) 08/27/2018   COVID-19 Vaccine (3 - Pfizer risk series) 12/11/2019   FOOT EXAM  12/05/2023   INFLUENZA VACCINE  02/27/2024   Health Maintenance Items Addressed: Mammogram ordered, Referral sent to GI for colonoscopy  Additional Screening:  Vision Screening: Recommended annual ophthalmology exams for early detection of glaucoma and other disorders of the eye. Would you like a referral to an eye doctor? No    Dental Screening: Recommended annual dental exams for proper oral hygiene  Community Resource Referral / Chronic Care Management: CRR required this visit?  No   CCM required this visit?  No   Plan:    I have personally reviewed and noted the following in the patient's chart:   Medical and social history Use of alcohol, tobacco or illicit drugs  Current medications and supplements including opioid prescriptions. Patient is not currently taking opioid prescriptions. Functional ability and status Nutritional status Physical activity Advanced directives List of other physicians Hospitalizations, surgeries, and ER visits in previous 12 months Vitals Screenings to include cognitive, depression, and falls Referrals and appointments  In addition, I have reviewed and discussed with patient certain  preventive protocols, quality metrics, and best practice recommendations. A written personalized care plan for preventive services as well as general preventive health recommendations were provided to patient.   Rojelio LELON Blush, LPN   1/[id number]/7974   After Visit Summary: (MyChart) Due to this being a telephonic visit, the after visit summary with patients personalized plan was offered to patient via MyChart   Notes: Nothing significant to report at this time.

## 2024-03-09 NOTE — Progress Notes (Signed)
 Nutrition Assessment   Reason for Assessment: MST screen for weight loss.    ASSESSMENT: Patient is 63 year old female with lung cancer, ESRD, DM, HTN, CHF and CVA.  She is followed by Dr. Timmy and has RD at dialysis center who has provided several types of ONS to aid in malnutrition and poor PO.  Patient reports uses Boost VHC BID but she feels it runs through her. She states all ONS that her RD has provided react similarly and she prefers the Boost.  She is has no appetite and has failed with appetite stimulant like Marinol  and megace .  She reports the  prednisone  has helped a little and she feels she's eating a bit more last couple of days.   Anthropometrics: down 13# past month (10%) significant for timeframe  Height: 67 Weight: 116# UBW: 123-129 (fluid shifts) BMI: 18.17   NUTRITION DIAGNOSIS: Inadequate PO intake to meet increased nutrient needs, r/t cancer diagnosis and anorexia    INTERVENTION:  Encouraged small frequent feeds and trying to eat 4-6 small meals in addition to using VHC  oral nutrition supplements Encouraged trial of Metamucil cookies to aid with solidifying stools Emailed Nutrition Tip sheet  for  High Protein High Calorie Snacking and fiber supplement Encouraged her to follow closely with RD at dialysis to aid with weight maintenance and regain. Contact information provided   MONITORING, EVALUATION, GOAL: weight, PO intake, Nutrition Impact Symptoms, labs Goal is weight maintenance  Next Visit: PRN at patient or provider request, encouraged her to follow closely with RD at dialysis  Micheline Craven, RDN, LDN Registered Dietitian, [facility] Part Time Remote (Usual office hours: Tuesday-Thursday) Cell: [phone]

## 2024-03-10 ENCOUNTER — Other Ambulatory Visit: Payer: Self-pay

## 2024-03-10 ENCOUNTER — Encounter: Payer: Self-pay | Admitting: Hematology & Oncology

## 2024-03-10 DIAGNOSIS — N186 End stage renal disease: Secondary | ICD-10-CM | POA: Diagnosis not present

## 2024-03-11 ENCOUNTER — Other Ambulatory Visit: Payer: Self-pay

## 2024-03-11 DIAGNOSIS — E1142 Type 2 diabetes mellitus with diabetic polyneuropathy: Secondary | ICD-10-CM

## 2024-03-11 DIAGNOSIS — N186 End stage renal disease: Secondary | ICD-10-CM

## 2024-03-11 DIAGNOSIS — I10 Essential (primary) hypertension: Secondary | ICD-10-CM

## 2024-03-11 DIAGNOSIS — Z79899 Other long term (current) drug therapy: Secondary | ICD-10-CM | POA: Diagnosis not present

## 2024-03-11 DIAGNOSIS — C3411 Malignant neoplasm of upper lobe, right bronchus or lung: Secondary | ICD-10-CM | POA: Diagnosis not present

## 2024-03-11 NOTE — Patient Instructions (Addendum)
 Visit Information  Thank you for taking time to visit with me today. Please don't hesitate to contact me if I can be of assistance to you before our next scheduled appointment.  Our next appointment is by telephone on 03/23/24 at 3:00 pm  Please call the care guide team at [phone] if you need to cancel or reschedule your appointment.   Following is a copy of your care plan:   Goals Addressed             This Visit's Progress    VBCI RN Care Plan       Problems:  Chronic Disease Management support and education needs related to oncology needs  Goal: Over the next 90 days the Patient will continue to work with RN Care Manager and/or Social Worker to address care management and care coordination needs related to oncology needs as evidenced by adherence to care management team scheduled appointments      Interventions:   Oncology: Assessment of understanding of oncology diagnosis:  Assessed patient understanding of cancer diagnosis and recommended treatment plan Reviewed upcoming provider appointments and treatment appointments Assessed available transportation to appointments and treatments. Has consistent/reliable transportation: Yes Assessed support system. Has consistent/reliable family or other support: Yes PHQ2/PHQ9 performed Discussed LCSW available for support. Patient denies need for referral at this time Discussed importance of eating nutritional meal. Encouraged to continue to communicate with nutritionist at Dialysis center Discussed oncology navigator-patient is not sure if she has an oncology navigator Discussed [facility] Navigator and provided contact number [phone]).  Patient Self-Care Activities:  Attend all scheduled provider appointments Call pharmacy for medication refills 3-7 days in advance of running out of medications Call provider office for new concerns or questions  Take medications as prescribed   Eat small frequent meals or as tolerated  Plan:   Next PCP appointment scheduled for: 03/23/24 at 3:00 pm             Please call the Suicide and Crisis Lifeline: 988 call the [place]  National Suicide Prevention Lifeline: 616 071 3196 or TTY: (260) 267-0092 TTY 606-737-9777) to talk to a trained counselor call 1-800-273-TALK (toll free, 24 hour hotline) if you are experiencing a Mental Health or Behavioral Health Crisis or need someone to talk to.  Patient verbalizes understanding of instructions and care plan provided today and agrees to view in MyChart. Active MyChart status and patient understanding of how to access instructions and care plan via MyChart confirmed with patient.     Heddy Shutter, RN, MSN, BSN, CCM [place]  [facility], Population Health Case Manager Phone: [phone]

## 2024-03-11 NOTE — Patient Outreach (Signed)
 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 Visit completed with patient on the phone.  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 Reports she is taking chemotherapy to for Right lung ca. HH treatment Monday, Wednesday, Friday. Patient reports appetite poor. She reports she is working with nutritionist at HD center regarding nutrition. Per review of chart, patient started on Prednisone  on 03/04/24 to stimulate appetite. Per medication review amlodipine  10 mg listed and amlodipine -olmesartan  listed on chart. Patient states she has been taking both, but states she is going to start taking only one of them. RNCM will request clarification from provider.  Patient Reported Symptoms:  Cognitive Cognitive Status: Insightful and able to interpret abstract concepts, Normal speech and language skills, Struggling with memory recall (patient reports sometimes difficulty with memory)   Health Maintenance Behaviors: Annual physical exam, Social activities  Neurological Neurological Review of Symptoms: Headaches, Numbness, Vision changes Neurological  Management Strategies: Medication therapy, Routine screening Neurological Self-Management Outcome: 4 (good)  HEENT HEENT Symptoms Reported: No symptoms reported      Cardiovascular Cardiovascular Symptoms Reported: No symptoms reported    Respiratory Respiratory Symptoms Reported: No symptoms reported    Endocrine Endocrine Symptoms Reported: No symptoms reported Is patient diabetic?: Yes Is patient checking blood sugars at home?: No Endocrine Self-Management Outcome: 3 (uncertain)  Gastrointestinal Gastrointestinal Symptoms Reported: Diarrhea Additional Gastrointestinal Details: patient reports GI referral Gastrointestinal Management Strategies: Incontinence garment/pad Gastrointestinal Self-Management Outcome: 3 (uncertain)    Genitourinary Additional Genitourinary Details: HD three times/week    Integumentary Integumentary Symptoms Reported: No symptoms reported    Musculoskeletal Musculoskelatal Symptoms Reviewed: Weakness, Muscle pain Additional Musculoskeletal Details: feet/legs cramps due to dialysis. denies pain at this time        Psychosocial       Quality of Family Relationships: involved, supportive Do you feel physically threatened by others?: No      03/11/2024    4:18 PM  Depression screen PHQ 2/9  Decreased Interest 1  Down, Depressed, Hopeless 0  PHQ - 2 Score 1    There were no vitals filed for this visit.  Medications Reviewed Today     Reviewed by Amber Boulay M, RN (Registered Nurse) on 03/11/24 at 1555  Med List Status: <None>   Medication Order Taking? Sig Documenting Provider Last Dose Status Informant  amLODipine  (NORVASC ) 10 MG tablet [id number] Yes Take 10 mg by mouth daily. Provider, Historical, Cross  Active   amlodipine -olmesartan  (AZOR ) 10-20 MG tablet [id number] Yes Take 1 tablet by mouth at bedtime. Amber Cross,  Amber Cross  Active   atorvastatin  (LIPITOR ) 80 MG tablet [id number] Yes Take 1 tablet (80 mg total) by mouth daily. Amber Cross  Active   Calcium  Acetate 668 (169 Ca) MG TABS [id number] Yes Take 1 tablet by mouth 3 (three) times daily. Provider, Historical, Cross  Active   Doxercalciferol  (HECTOROL  IV) [id number] Yes Take 3 mcg by mouth 3 (three) times a week. Provider, Historical, Cross  Active   dronabinol  (MARINOL ) 10 MG capsule [id number]  Take 1 capsule (10 mg total) by mouth daily before lunch.  Patient not taking: Reported on 03/11/2024   Amber Cross  Active   fluticasone  (FLONASE ) 50 MCG/ACT nasal spray [id number] Yes SPRAY 2 SPRAYS INTO EACH NOSTRIL EVERY DAY Amber Cross  Active   furosemide  (LASIX ) 40 MG tablet [id number]  Take 1 tablet (40 mg total) by mouth daily as needed (swelling in legs or weight gain).  Patient not taking: Reported on 03/11/2024   Amber Cross  Expired 01/23/24 2359            Med Note Amber Cross Feb 13, 2023  2:55 PM) 02/13/2023 Takes on non-dialysis days.  gabapentin  (NEURONTIN ) 100 MG capsule [id number] Yes Take 1 capsule (100 mg total) by mouth daily. Amber Cross  Active   hydrALAZINE  (APRESOLINE ) 100 MG tablet [id number] Yes Take 1 tablet (100 mg total) by mouth 2 (two) times daily. Amber Cross  Active   Lancet Devices (ADJUSTABLE LANCING DEVICE) MISC [id number]   Provider, Historical, Cross  Active   ondansetron  (ZOFRAN ) 8 MG tablet [id number] Yes Take 1 tablet (8 mg total) by mouth every 8 (eight) hours as needed for nausea or vomiting. Amber Cross  Active   pantoprazole  (PROTONIX ) 40 MG tablet [id number] Yes Take 40 mg by mouth daily. Provider, Historical, Cross  Active   prednisoLONE acetate (PRED FORTE) 1 % ophthalmic suspension [id number]  Place 1 drop into the left eye 4 (four) times daily.  Patient not taking: Reported on 03/11/2024   Provider, Historical, Cross  Active   predniSONE  (DELTASONE ) 20 MG tablet [id number] Yes Take 1 tablet (20 mg total) by mouth daily with breakfast. Amber Cross  Active   sevelamer   carbonate (RENVELA ) 800 MG tablet [id number] Yes Take 800 mg by mouth 3 (three) times daily with meals. Provider, Historical, Cross  Active   traZODone  (DESYREL ) 50 MG tablet [id number] Yes Take 1 tablet (50 mg total) by mouth at bedtime. Renne Homans, Cross  Active           Recommendation:   Referral to: Clinical pharmacy re: medication reconciliation  Request clarification regarding BP medication, patient request 3n1 BSC.  Follow Up Plan:   Telephone follow up appointment date/time:  03/23/24 at 3;00 pm  Heddy Shutter, RN, MSN, BSN, CCM [place]  [facility], Population Health Case Manager Phone: [phone]

## 2024-03-12 DIAGNOSIS — Z992 Dependence on renal dialysis: Secondary | ICD-10-CM | POA: Diagnosis not present

## 2024-03-12 DIAGNOSIS — N2581 Secondary hyperparathyroidism of renal origin: Secondary | ICD-10-CM | POA: Diagnosis not present

## 2024-03-12 DIAGNOSIS — E1122 Type 2 diabetes mellitus with diabetic chronic kidney disease: Secondary | ICD-10-CM | POA: Diagnosis not present

## 2024-03-12 DIAGNOSIS — N186 End stage renal disease: Secondary | ICD-10-CM | POA: Diagnosis not present

## 2024-03-15 ENCOUNTER — Other Ambulatory Visit: Payer: Self-pay | Admitting: *Deleted

## 2024-03-15 DIAGNOSIS — N2581 Secondary hyperparathyroidism of renal origin: Secondary | ICD-10-CM | POA: Diagnosis not present

## 2024-03-15 DIAGNOSIS — Z992 Dependence on renal dialysis: Secondary | ICD-10-CM | POA: Diagnosis not present

## 2024-03-15 DIAGNOSIS — N186 End stage renal disease: Secondary | ICD-10-CM | POA: Diagnosis not present

## 2024-03-15 NOTE — Progress Notes (Signed)
 Pt notified of provider instructions.

## 2024-03-16 ENCOUNTER — Ambulatory Visit (INDEPENDENT_AMBULATORY_CARE_PROVIDER_SITE_OTHER): Admitting: Ophthalmology

## 2024-03-16 ENCOUNTER — Encounter (INDEPENDENT_AMBULATORY_CARE_PROVIDER_SITE_OTHER): Payer: Self-pay | Admitting: Ophthalmology

## 2024-03-16 DIAGNOSIS — Z961 Presence of intraocular lens: Secondary | ICD-10-CM

## 2024-03-16 DIAGNOSIS — I10 Essential (primary) hypertension: Secondary | ICD-10-CM | POA: Diagnosis not present

## 2024-03-16 DIAGNOSIS — Z794 Long term (current) use of insulin: Secondary | ICD-10-CM | POA: Diagnosis not present

## 2024-03-16 DIAGNOSIS — H35033 Hypertensive retinopathy, bilateral: Secondary | ICD-10-CM | POA: Diagnosis not present

## 2024-03-16 DIAGNOSIS — E113413 Type 2 diabetes mellitus with severe nonproliferative diabetic retinopathy with macular edema, bilateral: Secondary | ICD-10-CM

## 2024-03-16 MED ORDER — AFLIBERCEPT 2MG/0.05ML IZ SOLN FOR KALEIDOSCOPE
2.0000 mg | INTRAVITREAL | Status: AC | PRN
  Administered 2024-03-16: 2 mg via INTRAVITREAL

## 2024-03-16 MED ORDER — AFLIBERCEPT 2MG/0.05ML IZ SOLN FOR KALEIDOSCOPE
2.0000 mg | INTRAVITREAL | Status: AC | PRN
Start: 2024-03-16 — End: 2024-03-16
  Administered 2024-03-16: 2 mg via INTRAVITREAL

## 2024-03-16 NOTE — Progress Notes (Addendum)
 [facility] - Clinic Note  03/16/2024     CHIEF COMPLAINT Patient presents for Retina Follow Up   HISTORY OF PRESENT ILLNESS: Amber Cross is a 63 y.o. female who presents to the clinic today for:  HPI     Retina Follow Up   Patient presents with  Diabetic Retinopathy.  In both eyes.  This started 5 weeks ago.  Duration of 5 weeks.  Since onset it is stable.  I, the attending physician,  performed the HPI with the patient and updated documentation appropriately.        Comments   5 week retina follow up NPDR and I'VE OU pt is reporting no vision changes noticed she denies any flashes having floaters her last reading was 126 Monday A1C unknown pt finished radiation 2 weeks ago after 10 rounds still on chemo 3 days per week       Last edited by Valdemar Rogue, MD on 03/16/2024  9:19 PM.    Pt states she can tell an improvement in OD. Feels black spots get more abundant if she doesn't come to appointments on time.   Referring physician: Clem Brands, PA-C  [facility] [address] [place],  [place] [address]  HISTORICAL INFORMATION:  Selected notes from the MEDICAL RECORD NUMBER Referred by Clem Brands, PA-C for concern of CME / PDR LEE:  Ocular Hx- PMH-   CURRENT MEDICATIONS: Current Outpatient Medications (Ophthalmic Drugs)  Medication Sig   prednisoLONE acetate (PRED FORTE) 1 % ophthalmic suspension Place 1 drop into the left eye 4 (four) times daily. (Patient not taking: Reported on 03/11/2024)   No current facility-administered medications for this visit. (Ophthalmic Drugs)   Current Outpatient Medications (Other)  Medication Sig   amlodipine -olmesartan  (AZOR ) 10-20 MG tablet Take 1 tablet by mouth at bedtime.   atorvastatin  (LIPITOR ) 80 MG tablet Take 1 tablet (80 mg total) by mouth daily.   Calcium  Acetate 668 (169 Ca) MG TABS Take 1 tablet by mouth 3 (three) times daily.   Doxercalciferol  (HECTOROL  IV) Take 3  mcg by mouth 3 (three) times a week.   dronabinol  (MARINOL ) 10 MG capsule Take 1 capsule (10 mg total) by mouth daily before lunch. (Patient not taking: Reported on 03/11/2024)   fluticasone  (FLONASE ) 50 MCG/ACT nasal spray SPRAY 2 SPRAYS INTO EACH NOSTRIL EVERY DAY   furosemide  (LASIX ) 40 MG tablet Take 1 tablet (40 mg total) by mouth daily as needed (swelling in legs or weight gain). (Patient not taking: Reported on 03/11/2024)   gabapentin  (NEURONTIN ) 100 MG capsule Take 1 capsule (100 mg total) by mouth daily.   hydrALAZINE  (APRESOLINE ) 100 MG tablet Take 1 tablet (100 mg total) by mouth 2 (two) times daily.   Lancet Devices (ADJUSTABLE LANCING DEVICE) MISC    ondansetron  (ZOFRAN ) 8 MG tablet Take 1 tablet (8 mg total) by mouth every 8 (eight) hours as needed for nausea or vomiting.   pantoprazole  (PROTONIX ) 40 MG tablet Take 40 mg by mouth daily.   predniSONE  (DELTASONE ) 20 MG tablet Take 1 tablet (20 mg total) by mouth daily with breakfast.   sevelamer  carbonate (RENVELA ) 800 MG tablet Take 800 mg by mouth 3 (three) times daily with meals.   traZODone  (DESYREL ) 50 MG tablet Take 1 tablet (50 mg total) by mouth at bedtime.   No current facility-administered medications for this visit. (Other)   REVIEW OF SYSTEMS: ROS   Positive for: Musculoskeletal, Endocrine, Cardiovascular, Eyes Negative for:  Constitutional, Gastrointestinal, Neurological, Skin, Genitourinary, HENT, Respiratory, Psychiatric, Allergic/Imm, Heme/Lymph Last edited by Resa Delon ORN, COT on 03/16/2024 12:30 PM.     ALLERGIES Allergies  Allergen Reactions   Penicillins Itching and Other (See Comments)    redness   Strawberry (Diagnostic) Itching and Swelling   Tomato Itching and Swelling   PAST MEDICAL HISTORY Past Medical History:  Diagnosis Date   Anemia    Anemia of chronic renal failure, stage 4 (severe) (HCC) 07/08/2023   Arthritis    Chest pain 05/07/2021   CHF (congestive heart failure) (HCC)     Chronic kidney disease    dialysis M-W-F   Depression    Diabetes mellitus    Headache    Hypercholesteremia    Hypertension    Hypertensive emergency 06/16/2022   Lung cancer (HCC) 12/18/2021   RUL small cell carcinoma, s/p radiation   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   Postop check 08/05/2022   Pseudogout    Stroke (HCC) 10/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   Tamponade 06/18/2022   Tobacco abuse    Past Surgical History:  Procedure Laterality Date   ANTERIOR CRUCIATE LIGAMENT REPAIR Right    AV FISTULA PLACEMENT Left 07/01/2022   Procedure: LEFT ARTERIOVENOUS (AV) FISTULA CREATION;  Surgeon: Eliza Lonni RAMAN, MD;  Location: [facility];  Service: Vascular;  Laterality: Left;  with regional block   BRONCHIAL BIOPSY  12/18/2021   Procedure: BRONCHIAL BIOPSIES;  Surgeon: Brenna Adine CROME, DO;  Location: [facility] ENDOSCOPY;  Service: Pulmonary;;   BRONCHIAL BRUSHINGS  12/18/2021   Procedure: BRONCHIAL BRUSHINGS;  Surgeon: Brenna Adine CROME, DO;  Location: [facility];  Service: Pulmonary;;   BRONCHIAL NEEDLE ASPIRATION BIOPSY  12/18/2021   Procedure: BRONCHIAL NEEDLE ASPIRATION BIOPSIES;  Surgeon: Brenna Adine CROME, DO;  Location: [facility];  Service: Pulmonary;;   CATARACT EXTRACTION     CHEST TUBE INSERTION Right 06/18/2022   Procedure: CHEST TUBE INSERTION;  Surgeon: Obadiah Coy, MD;  Location: [facility] OR;  Service: Thoracic;  Laterality: Right;   EXCHANGE OF A DIALYSIS CATHETER N/A 08/08/2022   Procedure: EXCHANGE OF A TUNNELED DIALYSIS CATHETER;  Surgeon: Serene Gaile ORN, MD;  Location: [facility] OR;  Service: Vascular;  Laterality: N/A;   FIDUCIAL MARKER PLACEMENT  12/18/2021   Procedure: FIDUCIAL MARKER PLACEMENT;  Surgeon: Brenna Adine CROME, DO;  Location: [facility];  Service: Pulmonary;;   IR FLUORO GUIDE CV LINE RIGHT  06/26/2022   IR US  GUIDE VASC ACCESS RIGHT  06/26/2022   OVARY SURGERY     RIGHT OOPHORECTOMY Right    SMALL INTESTINE SURGERY     SUBXYPHOID PERICARDIAL WINDOW N/A  06/18/2022   Procedure: SUBXYPHOID PERICARDIAL WINDOW;  Surgeon: Obadiah Coy, MD;  Location: [facility] OR;  Service: Thoracic;  Laterality: N/A;   TEE WITHOUT CARDIOVERSION N/A 06/18/2022   Procedure: TRANSESOPHAGEAL ECHOCARDIOGRAM (TEE);  Surgeon: Obadiah Coy, MD;  Location: [facility] OR;  Service: Thoracic;  Laterality: N/A;   TUBAL LIGATION     VIDEO BRONCHOSCOPY WITH RADIAL ENDOBRONCHIAL ULTRASOUND  12/18/2021   Procedure: RADIAL ENDOBRONCHIAL ULTRASOUND;  Surgeon: Brenna Adine CROME, DO;  Location: [facility];  Service: Pulmonary;;   FAMILY HISTORY Family History  Problem Relation Age of Onset   Diabetes Mother    Hypertension Mother    Hypertension Father    SOCIAL HISTORY Social History   Tobacco Use   Smoking status: Every Day    Current packs/day: 0.50    Average packs/day: 0.5 packs/day for 40.0 years (20.0 ttl pk-yrs)  Types: Cigarettes   Smokeless tobacco: Never   Tobacco comments:    Cutting back 1/4-1/2 ppd  Vaping Use   Vaping status: Never Used  Substance Use Topics   Alcohol use: Not Currently   Drug use: Yes    Frequency: 4.0 times per week    Types: Marijuana    Comment: smokes every other day      OPHTHALMIC EXAM:  Base Eye Exam     Visual Acuity (Snellen - Linear)       Right Left   Dist cc 20/30 -2 20/30   Dist ph cc 20/25 NI    Correction: Glasses         Tonometry (Tonopen, 12:38 PM)       Right Left   Pressure 09 12         Pupils       Pupils Dark Light Shape React APD   Right PERRL 3 2 Round Brisk None   Left PERRL 3 2 Round Brisk None         Visual Fields       Left Right    Full Full         Extraocular Movement       Right Left    Full, Ortho Full, Ortho         Neuro/Psych     Oriented x3: Yes   Mood/Affect: Normal         Dilation     Both eyes: 2.5% Phenylephrine  @ 12:38 PM           Slit Lamp and Fundus Exam     Slit Lamp Exam       Right Left   Lids/Lashes Dermatochalasis - upper  lid, mild MGD Dermatochalasis - upper lid, mild MGD   Conjunctiva/Sclera nasal pingeucula, Melanosis nasal pingeucula, mild Melanosis   Cornea arcus, well healed cataract wound, nasal LRI, trace Punctate epithelial erosions arcus, trace Punctate epithelial erosions, trace tear film debris, well healed cataract wound, fine endo pigment   Anterior Chamber deep and clear deep and clear, no cell or flare   Iris Round and dilated Round and dilated   Lens PC IOL in good position PC IOL in good position   Anterior Vitreous mild syneresis, Posterior vitreous detachment mild syneresis         Fundus Exam       Right Left   Disc Pink and Sharp, temporal pallor, temporal PPP Pink and Sharp, temporal pallor, temporal PPP, no NVD   C/D Ratio 0.3 0.3   Macula Good foveal reflex, scattered MA, DBH and punctate exudates -- improved, +cystic changes -- improved good foveal reflex, dense central cluster of exudates -- improved, scattered MA, DBH and central edema -- improved   Vessels attenuated, Tortuous, no NV attenuated, Tortuous, no NV   Periphery Attached, scattered MA Attached, rare MA           Refraction     Wearing Rx       Sphere Cylinder Axis Add   Right +1.50 +0.50 167 +2.50   Left -0.50 +0.50 121 +2.50    Type: Bifocal           IMAGING AND PROCEDURES  Imaging and Procedures for 03/16/2024  OCT, Retina - OU - Both Eyes       Right Eye Quality was good. Central Foveal Thickness: 223. Progression has improved. Findings include normal foveal contour, no SRF, intraretinal hyper-reflective material, intraretinal fluid, pigment epithelial detachment (  Interval improvement IRF/cystic changes greatest superior mac ).   Left Eye Quality was good. Central Foveal Thickness: 179. Progression has improved. Findings include no SRF, abnormal foveal contour, subretinal hyper-reflective material, intraretinal hyper-reflective material, intraretinal fluid, outer retinal atrophy (Mild  interval improvement in IRF/IRHM greatest IT macula ).   Notes *Images captured and stored on drive  Diagnosis / Impression:  DME OU OD: Interval improvement IRF/cystic changes greatest  superior mac  OS: Mild interval improvement in IRF/IRHM greatest IT macula   Clinical management:  See below  Abbreviations: NFP - Normal foveal profile. CME - cystoid macular edema. PED - pigment epithelial detachment. IRF - intraretinal fluid. SRF - subretinal fluid. EZ - ellipsoid zone. ERM - epiretinal membrane. ORA - outer retinal atrophy. ORT - outer retinal tubulation. SRHM - subretinal hyper-reflective material. IRHM - intraretinal hyper-reflective material      Intravitreal Injection, Pharmacologic Agent - OD - Right Eye       Time Out 03/16/2024. 1:17 PM. Confirmed correct patient, procedure, site, and patient consented.   Anesthesia Topical anesthesia was used. Anesthetic medications included Lidocaine  2%, Proparacaine 0.5%.   Procedure Preparation included 5% betadine to ocular surface, eyelid speculum. A (32g) needle was used.   Injection: 2 mg aflibercept  2 MG/0.05ML   Route: Intravitreal, Site: Right Eye   NDC: D2246706, Lot: [id number], Expiration date: 05/28/2025, Waste: 0 mL   Post-op Post injection exam found visual acuity of at least counting fingers. The patient tolerated the procedure well. There were no complications. The patient received written and verbal post procedure care education.      Intravitreal Injection, Pharmacologic Agent - OS - Left Eye       Time Out 03/16/2024. 1:17 PM. Confirmed correct patient, procedure, site, and patient consented.   Anesthesia Topical anesthesia was used. Anesthetic medications included Lidocaine  2%, Proparacaine 0.5%.   Procedure Preparation included 5% betadine to ocular surface, eyelid speculum. A (32g) needle was used.   Injection: 2 mg aflibercept  2 MG/0.05ML   Route: Intravitreal, Site: Left Eye   NDC:  D2246706, Lot: [id number], Expiration date: 05/28/2025, Waste: 0 mL   Post-op Post injection exam found visual acuity of at least counting fingers. The patient tolerated the procedure well. There were no complications. The patient received written and verbal post procedure care education.           ASSESSMENT/PLAN:   ICD-10-CM   1. Severe nonproliferative diabetic retinopathy of both eyes with macular edema associated with type 2 diabetes mellitus (HCC)  E11.3413 OCT, Retina - OU - Both Eyes    Intravitreal Injection, Pharmacologic Agent - OD - Right Eye    Intravitreal Injection, Pharmacologic Agent - OS - Left Eye    aflibercept  (EYLEA ) SOLN 2 mg    aflibercept  (EYLEA ) SOLN 2 mg    2. Current use of insulin  (HCC)  Z79.4     3. Essential hypertension  I10     4. Hypertensive retinopathy of both eyes  H35.033     5. Pseudophakia, both eyes  Z96.1      1,2 Severe Non-proliferative diabetic retinopathy, both eyes  - A1C 7.8 (08.13.24), 10.4 (05.09.24), 6.7 (11.30.23) - s/p IVA OD #1 (05.02.24), #2 (05.30.24), #3 (07.24.24), #4 (08.21.24), #5 (10.08.24), #6 (11.05.24), #7 (12.10.24), #8 (01.14.25), #9 (02.18.25), #10 (03.25.25), #11 (04.29.25), #12 (06.03.25) - s/p IVA OS #1 (04.04.24), #2 (05.02.24), #3 (05.30.24), #4 (07.24.24), #5 (08.21.24) -- IVA resistance OS - s/p IVE OD #1 (07.23.25) - s/p IVE OS #1 (10.08.24), #  2 (11.05.24), #3 (12.10.24), #4 (01.14.25), #5 (02.18.25), #6 (03.25.25), #7 (04.29.25), #8 (06.03.25), #9 (07.23.25) - BCVA OD 20/25 improved from 20/40, OS 20/30 from 20/40 - exam shows scattered MA/DBH and exudates OU (OS > OD) - FA (04.04.24) shows leaking MA greatest posteriorly, no NV OU - OCT shows  OD: Interval improvement IRF/cystic changes greatest  superior mac ; OS: Mild interval improvement in IRF/IRHM greatest IT macula at 5 weeks - recommend IVE OD #2 and IVE OS #10 today, 08.19.25 for DME w/ f/u in 5 wks again - pt wishes to proceed - RBA of  procedure discussed, questions answered - Eylea  approved with insurance - IVA informed consent obtained and signed, 04.04.24 (OU) - IVE informed consent obtained and signed, 10.08.24 (OU) - see procedure note - f/u in 5 wks -- DFE/OCT, possible injection  3,4. Hypertensive retinopathy OU - discussed importance of tight BP control - monitor  5. Pseudophakia OU  - s/p CE/IOL OU (Dr. Octavia, OD: 2017, OS: 03.04.25)  - IOL in good position, doing well  - monitor  Ophthalmic Meds Ordered this visit:  Meds ordered this encounter  Medications   aflibercept  (EYLEA ) SOLN 2 mg   aflibercept  (EYLEA ) SOLN 2 mg    Return in about 5 weeks (around 04/20/2024) for NPDR OU, DFE, OCT, Possible Injxn.  There are no Patient Instructions on file for this visit.  Explained the diagnoses, plan, and follow up with the patient and they expressed understanding.  Patient expressed understanding of the importance of proper follow up care.   This document serves as a record of services personally performed by Redell JUDITHANN Hans, MD, PhD. It was created on their behalf by Auston Muzzy, COMT. The creation of this record is the provider's dictation and/or activities during the visit.  Electronically signed by: Auston Muzzy, COMT 03/16/24 9:38 PM  This document serves as a record of services personally performed by Redell JUDITHANN Hans, MD, PhD. It was created on their behalf by Almetta Pesa, an ophthalmic technician. The creation of this record is the provider's dictation and/or activities during the visit.    Electronically signed by: Almetta Pesa, OA, 03/16/24  9:38 PM  Redell JUDITHANN Hans, M.D., Ph.D. Diseases & Surgery of the Retina and Vitreous [facility]  I have reviewed the above documentation for accuracy and completeness, and I agree with the above. Redell JUDITHANN Hans, M.D., Ph.D. 03/16/24 9:38 PM   Abbreviations: M myopia (nearsighted); A astigmatism; H hyperopia (farsighted); P  presbyopia; Mrx spectacle prescription;  CTL contact lenses; OD right eye; OS left eye; OU both eyes  XT exotropia; ET esotropia; PEK punctate epithelial keratitis; PEE punctate epithelial erosions; DES dry eye syndrome; MGD meibomian gland dysfunction; ATs artificial tears; PFAT's preservative free artificial tears; NSC nuclear sclerotic cataract; PSC posterior subcapsular cataract; ERM epi-retinal membrane; PVD posterior vitreous detachment; RD retinal detachment; DM diabetes mellitus; DR diabetic retinopathy; NPDR non-proliferative diabetic retinopathy; PDR proliferative diabetic retinopathy; CSME clinically significant macular edema; DME diabetic macular edema; dbh dot blot hemorrhages; CWS cotton wool spot; POAG primary open angle glaucoma; C/D cup-to-disc ratio; HVF humphrey visual field; GVF goldmann visual field; OCT optical coherence tomography; IOP intraocular pressure; BRVO Branch retinal vein occlusion; CRVO central retinal vein occlusion; CRAO central retinal artery occlusion; BRAO branch retinal artery occlusion; RT retinal tear; SB scleral buckle; PPV pars plana vitrectomy; VH Vitreous hemorrhage; PRP panretinal laser photocoagulation; IVK intravitreal kenalog; VMT vitreomacular traction; MH Macular hole;  NVD neovascularization of the  disc; NVE neovascularization elsewhere; AREDS age related eye disease study; ARMD age related macular degeneration; POAG primary open angle glaucoma; EBMD epithelial/anterior basement membrane dystrophy; ACIOL anterior chamber intraocular lens; IOL intraocular lens; PCIOL posterior chamber intraocular lens; Phaco/IOL phacoemulsification with intraocular lens placement; PRK photorefractive keratectomy; LASIK laser assisted in situ keratomileusis; HTN hypertension; DM diabetes mellitus; COPD chronic obstructive pulmonary disease

## 2024-03-17 ENCOUNTER — Ambulatory Visit (INDEPENDENT_AMBULATORY_CARE_PROVIDER_SITE_OTHER): Admitting: Physician Assistant

## 2024-03-17 ENCOUNTER — Encounter: Payer: Self-pay | Admitting: Physician Assistant

## 2024-03-17 ENCOUNTER — Other Ambulatory Visit: Payer: Self-pay

## 2024-03-17 VITALS — BP 149/51 | HR 98 | Ht 67.0 in | Wt 115.6 lb

## 2024-03-17 DIAGNOSIS — R300 Dysuria: Secondary | ICD-10-CM

## 2024-03-17 MED ORDER — CEPHALEXIN 250 MG PO CAPS: 250.0000 mg | ORAL_CAPSULE | Freq: Every day | ORAL | 0 refills | Status: AC

## 2024-03-17 MED ORDER — AMOXICILLIN 500 MG PO CAPS: 500.0000 mg | ORAL_CAPSULE | Freq: Once | ORAL | 0 refills | Status: AC

## 2024-03-17 NOTE — Progress Notes (Signed)
 Established patient visit   Patient: Amber Cross   DOB: 1961/01/05   63 y.o. Female  MRN: [id number] Visit Date: 03/17/2024  Today's healthcare provider: Manuelita Flatness, PA-C   Cc. dysuria  Subjective     Pt reports intermittent dysuria x 2 weeks. Denies vaginal discharge, itching, rash. She does not urinate much 2/2 dialysis.    Medications: Outpatient Medications Prior to Visit  Medication Sig   amlodipine -olmesartan  (AZOR ) 10-20 MG tablet Take 1 tablet by mouth at bedtime.   atorvastatin  (LIPITOR ) 80 MG tablet Take 1 tablet (80 mg total) by mouth daily.   Calcium  Acetate 668 (169 Ca) MG TABS Take 1 tablet by mouth 3 (three) times daily.   Doxercalciferol  (HECTOROL  IV) Take 3 mcg by mouth 3 (three) times a week.   dronabinol  (MARINOL ) 10 MG capsule Take 1 capsule (10 mg total) by mouth daily before lunch. (Patient not taking: Reported on 03/11/2024)   fluticasone  (FLONASE ) 50 MCG/ACT nasal spray SPRAY 2 SPRAYS INTO EACH NOSTRIL EVERY DAY   furosemide  (LASIX ) 40 MG tablet Take 1 tablet (40 mg total) by mouth daily as needed (swelling in legs or weight gain). (Patient not taking: Reported on 03/11/2024)   gabapentin  (NEURONTIN ) 100 MG capsule Take 1 capsule (100 mg total) by mouth daily.   hydrALAZINE  (APRESOLINE ) 100 MG tablet Take 1 tablet (100 mg total) by mouth 2 (two) times daily.   Lancet Devices (ADJUSTABLE LANCING DEVICE) MISC    ondansetron  (ZOFRAN ) 8 MG tablet Take 1 tablet (8 mg total) by mouth every 8 (eight) hours as needed for nausea or vomiting.   pantoprazole  (PROTONIX ) 40 MG tablet Take 40 mg by mouth daily.   prednisoLONE acetate (PRED FORTE) 1 % ophthalmic suspension Place 1 drop into the left eye 4 (four) times daily. (Patient not taking: Reported on 03/11/2024)   predniSONE  (DELTASONE ) 20 MG tablet Take 1 tablet (20 mg total) by mouth daily with breakfast.   sevelamer  carbonate (RENVELA ) 800 MG tablet Take 800 mg by mouth 3 (three) times daily with meals.    traZODone  (DESYREL ) 50 MG tablet Take 1 tablet (50 mg total) by mouth at bedtime.   No facility-administered medications prior to visit.    Review of Systems  Constitutional:  Negative for fatigue and fever.  Respiratory:  Negative for cough and shortness of breath.   Cardiovascular:  Negative for chest pain and leg swelling.  Gastrointestinal:  Negative for abdominal pain.  Genitourinary:  Positive for dysuria.  Neurological:  Negative for dizziness and headaches.       Objective    BP (!) 149/51   Pulse 98   Ht 5' 7 (1.702 m)   Wt 115 lb 9.6 oz (52.4 kg)   LMP 09/29/2011   BMI 18.11 kg/m    Physical Exam Constitutional:      General: She is awake.     Appearance: She is well-developed.  HENT:     Head: Normocephalic.  Eyes:     Conjunctiva/sclera: Conjunctivae normal.  Cardiovascular:     Rate and Rhythm: Normal rate and regular rhythm.     Heart sounds: Normal heart sounds.  Pulmonary:     Effort: Pulmonary effort is normal.  Skin:    General: Skin is warm.  Neurological:     Mental Status: She is alert and oriented to person, place, and time.  Psychiatric:        Attention and Perception: Attention normal.  Mood and Affect: Mood normal.        Speech: Speech normal.        Behavior: Behavior is cooperative.      No results found for any visits on 03/17/24.  Assessment & Plan    Dysuria -     Cephalexin ; Take 1 capsule (250 mg total) by mouth daily.  Dispense: 5 capsule; Refill: 0 -     Urine Culture; Future  Pt unable to urinate today. Given sterile urine cup to return, culture order pended.   Tx empirically keflex  250 mg daily x 5 days. All to pcn, but on chart review has taken keflex  without issue. Return in about 6 weeks (around 04/28/2024) for chronic conditions w/ new provider, rec Dr Antonio.       Manuelita Flatness, PA-C  [facility] [phone] (phone) [phone] (fax)  [facility]

## 2024-03-18 ENCOUNTER — Telehealth: Payer: Self-pay

## 2024-03-18 NOTE — Progress Notes (Signed)
 Complex Care Management Note  Care Guide Note 03/18/2024 Name: Amber Cross MRN: [id number] DOB: 11-22-1960  Amber Cross is a 63 y.o. year old female who sees Cyndi, Manuelita, PA-C for primary care. I reached out to [name] by phone today to offer complex care management services.  [name] was given information about Complex Care Management services today including:   The Complex Care Management services include support from the care team which includes your Nurse Care Manager, Clinical Social Worker, or Pharmacist.  The Complex Care Management team is here to help remove barriers to the health concerns and goals most important to you. Complex Care Management services are voluntary, and the patient may decline or stop services at any time by request to their care team member.   Complex Care Management Consent Status: Patient did not agree to participate in complex care management services at this time.  Follow up plan:  Patient will follow up with PCP.  Encounter Outcome:  Patient Refused  Dreama Agent [facility], [facility] VBCI Assistant Direct Dial: [phone]  Fax: [phone]

## 2024-03-18 NOTE — Progress Notes (Signed)
 Complex Care Management Note Care Guide Note  03/18/2024 Name: Amber Cross MRN: [id number] DOB: [birth year]   Complex Care Management Outreach Attempts: An unsuccessful telephone outreach was attempted today to offer the patient information about available complex care management services.  Follow Up Plan:  Additional outreach attempts will be made to offer the patient complex care management information and services.   Encounter Outcome:  No Answer  [name]  [facility], [facility] VBCI Assistant Direct Dial: [phone]  Fax: [phone]

## 2024-03-19 DIAGNOSIS — E1122 Type 2 diabetes mellitus with diabetic chronic kidney disease: Secondary | ICD-10-CM | POA: Diagnosis not present

## 2024-03-19 DIAGNOSIS — N2581 Secondary hyperparathyroidism of renal origin: Secondary | ICD-10-CM | POA: Diagnosis not present

## 2024-03-19 DIAGNOSIS — N186 End stage renal disease: Secondary | ICD-10-CM | POA: Diagnosis not present

## 2024-03-22 DIAGNOSIS — E1122 Type 2 diabetes mellitus with diabetic chronic kidney disease: Secondary | ICD-10-CM | POA: Diagnosis not present

## 2024-03-23 ENCOUNTER — Other Ambulatory Visit: Payer: Self-pay

## 2024-03-23 NOTE — Patient Instructions (Signed)
 Visit Information  Thank you for taking time to visit with me today. Please don't hesitate to contact me if I can be of assistance to you before our next scheduled appointment.  Your next care management appointment is by telephone on 04/22/24 at 2:30 pm  Please call the care guide team at [phone] if you need to cancel, schedule, or reschedule an appointment.   Please call the Suicide and Crisis Lifeline: 988 call the [place]  National Suicide Prevention Lifeline: 773-767-5506 or TTY: 740-110-1088 TTY 507-430-6536) to talk to a trained counselor call 1-800-273-TALK (toll free, 24 hour hotline) if you are experiencing a Mental Health or Behavioral Health Crisis or need someone to talk to.  Heddy Shutter, RN, MSN, BSN, CCM [place]  [facility], Population Health Case Manager Phone: [phone]    Eating Plan for Hemodialysis When you get hemodialysis, you need to watch what you eat. This is because extra fluid and waste can build up in your body between treatments. This buildup can make you sick. Watching what you eat and drink can help control the buildup of fluid and waste and help you feel better. Below are tips on eating well while getting hemodialysis. Your treatment team or a food expert called a dietitian will teach you more about eating well. You'll be told about: What you should eat. What foods and drinks you should limit or avoid. How much liquid to drink each day. Meal planning. Keeping a diary of what you eat and drink. Tracking your weight to watch for fluid changes. Helpful tips Read food labels  Talk with your treatment team about what you should look for on food labels. You'll be told about any foods you should limit. For instance, you might be told to check food labels for salt, or sodium, content. There tends to be a lot of salt in processed or cured meats, frozen meals, canned vegetables, and salty snack foods. Salt is also in ketchup, mustard,  soy sauce, and some seasonings. You may need to look for no salt added or low sodium labels. You might also be told to limit the amount of added phosphorus and potassium in your diet. Look at the ingredients lists on food labels. If you see words with phos in them, phosphorus has been added. Limit these foods. Salt substitutes tend to have a lot of potassium. Do not use those that have potassium or potassium chloride  in the ingredients list. Try herbs and other spice blends instead. Keep track of how much you drink You may be told how much liquid you can drink each day. You'll also need to watch for foods that are mostly liquid, like soup, gelatin, and juicy fruits and vegetables. Limiting your liquid intake can help keep extra fluid from building up in your body. It can also help hemodialysis work better. This is because only a certain amount of fluid can be safely removed during each treatment. Still, too much fluid in your body can be hard on your heart. So, if you drink too much liquid, extra fluid may need to be removed during treatment. Doing this may make you feel bad during treatment. You may get an upset stomach, have muscle cramps, or have a sudden drop in blood pressure that makes you feel like you're going to pass out. Plan your meals Talk with your dietitian about what type and amount of food to eat. Most people on dialysis should try to eat: 6-11 servings of grains each day. One serving = 1  slice of bread or  cup of cooked rice or pasta. 2-3 servings of vegetables each day. One serving =  cup. 2-3 servings of fruits each day. One serving =  cup. Protein, such as meat, poultry, fish, eggs, beans, or lentils. Ask what types and amounts of protein to eat. -1 cup (118-236 mL) of dairy each day. Vitamins You may have trouble getting the vitamins and minerals your body needs because you must limit so many foods. Vitamins are also removed during hemodialysis. Your health care  provider may give you a prescription vitamin to take every day. Do not take vitamins you can buy over the counter. They're not made for people on dialysis and can cause harm. What foods should I eat? Fruits Apples. Fresh or frozen berries. Fresh or canned pears and pineapple. Grapes. Plums. Vegetables Fresh or frozen broccoli, carrots, and green beans. Cabbage. Cauliflower. Celery. Cucumbers. Eggplant. Radishes. Grains Whole-grain breads, cereals, pasta, and crackers. Meats and other proteins Fresh or frozen beef, pork, poultry, and fish. Eggs. Dried beans, peas, and lentils. Tofu. Dairy Limited amounts of milk, half-and-half, yogurt, pudding, ice cream, or natural cheese such as cheddar, mozzarella, or Swiss. Beverages Apple cider. Cranberry or grape juice. Lemonade. Black coffee. Rice, almond, oat, or soy milk that has not had phosphorus added to it (not enriched or fortified). Seasonings and condiments Herbs. Spices. Jam and jelly. Honey. Hot sauce. Sweets and desserts Sherbet. Cakes. Cookies. Fats and oils Olive oil, canola oil, and safflower oil. The items listed above may not be all the foods and drinks you can have. Talk with a dietitian to learn more. What foods should I limit? Fruits Do not eat starfruit. It can be toxic for people with kidney problems. Limit dried fruit, kiwi, and bananas because they contain a lot of potassium. Vegetables Potatoes. Beets. Tomatoes. Winter squash and pumpkin. Asparagus. Spinach. Parsnips. Avocados. Grains White or processed grains. Meats and other proteins Precooked, canned, smoked, and cured meats. Packaged meat. Sardines. Dairy Processed cheese (American cheese and soft cheeses that are not refrigerated). Cheese spreads and dips. Beverages Orange juice. Prune juice. Fizzy dark sodas or soft drinks with added phosphorus or phosphates. Seasonings and condiments Salt. Salt substitutes that contain potassium. Soy sauce. Teriyaki sauce. Most  coffee creamer. Sweets and desserts Ice cream. Chocolate. Candied nuts. Nondairy whipped topping. Fats and oils All are okay in limited amounts. Other Ready-made frozen meals, except those approved by your dietitian. Canned soups. The items listed above may not be all the foods and drinks you should limit. Talk with a dietitian to learn more. To learn more You can learn more about hemodialysis and eating well at: [facility]: [employer]si National Kidney Foundation: kidney.org This information is not intended to replace advice given to you by your health care provider. Make sure you discuss any questions you have with your health care provider. Document Revised: 11/04/2022 Document Reviewed: 11/04/2022 Elsevier Patient Education  2024 [employer].

## 2024-03-23 NOTE — Patient Outreach (Signed)
 Complex Care Management   Visit Note  03/23/2024  Name:  Amber Cross MRN: [id number] DOB: 01-25-1961  Situation: Referral received for Complex Care Management related to right lung cancer,metastasis to brain I obtained verbal consent from Patient.  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 Patient completed visit with PCP on 03/17/24 -treating empirically with Cephalexin  x5 days re: dysuria. RNCM reinforced the need schedule 6 week follow up with provider in office around 04/28/24. Patient continues with HD on MWF. Patient Reported Symptoms:  Cognitive Cognitive Status: Insightful and able to interpret abstract concepts, Normal speech and language skills, Alert and oriented to person, place, and time      Neurological Neurological Review of Symptoms: No symptoms reported    HEENT HEENT Symptoms Reported: No symptoms reported      Cardiovascular Cardiovascular Symptoms Reported: No symptoms reported    Respiratory Respiratory Symptoms Reported: No symptoms reported    Endocrine Endocrine Symptoms Reported: No symptoms reported Is patient diabetic?: Yes Is patient checking blood sugars at home?: No (diet controlled/HD patient)    Gastrointestinal Gastrointestinal Symptoms Reported:  Diarrhea Additional Gastrointestinal Details: patient reports diarrhea is better.      Genitourinary Additional Genitourinary Details: HD three times/week    Integumentary Integumentary Symptoms Reported: No symptoms reported    Musculoskeletal Musculoskelatal Symptoms Reviewed: No symptoms reported        Psychosocial Psychosocial Symptoms Reported: No symptoms reported          There were no vitals filed for this visit.  Medications Reviewed Today     Reviewed by Nathaniel Yaden M, RN (Registered Nurse) on 03/23/24 at 1620  Med List Status: <None>   Medication Order Taking? Sig Documenting Provider Last Dose Status Informant  amlodipine -olmesartan  (AZOR ) 10-20 MG tablet [id number] Yes Take 1 tablet by mouth at bedtime. Cyndi Shaver, PA-C  Active   atorvastatin  (LIPITOR ) 80 MG tablet [id number] Yes Take 1 tablet (80 mg total) by mouth daily. Cyndi Shaver, PA-C  Active   Calcium  Acetate 668 (169 Ca) MG TABS [id number] Yes Take 1 tablet by mouth 3 (three) times daily. [provider]  Active   cephALEXin  (KEFLEX ) 250 MG capsule [id number] Yes Take 1 capsule (250 mg total) by mouth daily. Cyndi Shaver, PA-C  Active   Doxercalciferol  (HECTOROL  IV) [id number] Yes Take 3 mcg by mouth 3 (three) times a week. [provider]  Active   dronabinol  (MARINOL ) 10 MG capsule [id number]  Take 1 capsule (10 mg total) by mouth daily before lunch.  Patient not taking: Reported on 03/23/2024   Timmy Maude SAUNDERS, MD  Active   fluticasone  (FLONASE ) 50 MCG/ACT nasal spray [id number] Yes SPRAY 2 SPRAYS INTO  EACH NOSTRIL EVERY DAY Cyndi Shaver, PA-C  Active   furosemide  (LASIX ) 40 MG tablet [id number]  Take 1 tablet (40 mg total) by mouth daily as needed (swelling in legs or weight gain).  Patient not taking: Reported on 03/23/2024   Emmy Justus DEL, MD  Expired 01/23/24 2359            Med Note [name] [place] Feb 13, 2023  2:55 PM) 02/13/2023 Takes on non-dialysis days.   gabapentin  (NEURONTIN ) 100 MG capsule [id number] Yes Take 1 capsule (100 mg total) by mouth daily. Cyndi Shaver, PA-C  Active   hydrALAZINE  (APRESOLINE ) 100 MG tablet [id number] Yes Take 1 tablet (100 mg total) by mouth 2 (two) times daily. Cyndi Shaver, PA-C  Active   Lancet Devices (ADJUSTABLE LANCING DEVICE) MISC [id number]   [provider]  Active   ondansetron  (ZOFRAN ) 8 MG tablet [id number] Yes Take 1 tablet (8 mg total) by mouth every 8 (eight) hours as needed for nausea or vomiting. Timmy Maude SAUNDERS, MD  Active   pantoprazole  (PROTONIX ) 40 MG tablet [id number] Yes Take 40 mg by mouth daily. [provider]  Active   prednisoLONE acetate (PRED FORTE) 1 % ophthalmic suspension [id number]  Place 1 drop into the left eye 4 (four) times daily.  Patient not taking: Reported on 03/23/2024   [provider]  Active   predniSONE  (DELTASONE ) 20 MG tablet [id number] Yes Take 1 tablet (20 mg total) by mouth daily with breakfast. Timmy Maude SAUNDERS, MD  Active   sevelamer  carbonate (RENVELA ) 800 MG tablet [id number] Yes Take 800 mg by mouth 3 (three) times daily with meals. [provider]  Active   traZODone  (DESYREL ) 50 MG tablet [id number] Yes Take 1 tablet (50 mg total) by mouth at bedtime. Renne Homans, MD  Active           Recommendation:   Continue Current Plan of Care  Follow Up Plan:   Telephone follow up appointment date/time:  04/22/24 at 2:30 pm  Heddy Shutter, RN, MSN, BSN, CCM [place]  [facility], Population Health Case Manager Phone: [phone]

## 2024-03-24 ENCOUNTER — Telehealth: Payer: Self-pay | Admitting: *Deleted

## 2024-03-24 DIAGNOSIS — N186 End stage renal disease: Secondary | ICD-10-CM | POA: Diagnosis not present

## 2024-03-24 NOTE — Telephone Encounter (Unsigned)
 Orders faxed to [facility]     Copied from CRM [id number]. Topic: Clinical - Order For Equipment >> Mar 24, 2024  4:48 PM Winona R wrote: Yami from [facility] calling to request orders for the following. Shower bench  bedside commode rollator walker  May be sent to any of the following. Fax numbers not avail. Camelia Evan PHM [employer]

## 2024-03-25 ENCOUNTER — Other Ambulatory Visit: Payer: Self-pay | Admitting: Physician Assistant

## 2024-03-25 DIAGNOSIS — Z8673 Personal history of transient ischemic attack (TIA), and cerebral infarction without residual deficits: Secondary | ICD-10-CM

## 2024-03-25 DIAGNOSIS — N186 End stage renal disease: Secondary | ICD-10-CM

## 2024-03-25 DIAGNOSIS — E1140 Type 2 diabetes mellitus with diabetic neuropathy, unspecified: Secondary | ICD-10-CM

## 2024-03-25 DIAGNOSIS — I5043 Acute on chronic combined systolic (congestive) and diastolic (congestive) heart failure: Secondary | ICD-10-CM

## 2024-03-25 DIAGNOSIS — C3411 Malignant neoplasm of upper lobe, right bronchus or lung: Secondary | ICD-10-CM

## 2024-03-25 NOTE — Telephone Encounter (Signed)
 Wrote prescriptions will fax to [facility]

## 2024-03-26 DIAGNOSIS — N186 End stage renal disease: Secondary | ICD-10-CM | POA: Diagnosis not present

## 2024-03-28 DIAGNOSIS — N186 End stage renal disease: Secondary | ICD-10-CM | POA: Diagnosis not present

## 2024-03-28 DIAGNOSIS — Z992 Dependence on renal dialysis: Secondary | ICD-10-CM | POA: Diagnosis not present

## 2024-03-28 DIAGNOSIS — I129 Hypertensive chronic kidney disease with stage 1 through stage 4 chronic kidney disease, or unspecified chronic kidney disease: Secondary | ICD-10-CM | POA: Diagnosis not present

## 2024-03-29 DIAGNOSIS — N186 End stage renal disease: Secondary | ICD-10-CM | POA: Diagnosis not present

## 2024-03-29 DIAGNOSIS — N2581 Secondary hyperparathyroidism of renal origin: Secondary | ICD-10-CM | POA: Diagnosis not present

## 2024-03-29 DIAGNOSIS — Z992 Dependence on renal dialysis: Secondary | ICD-10-CM | POA: Diagnosis not present

## 2024-03-30 DIAGNOSIS — C7931 Secondary malignant neoplasm of brain: Secondary | ICD-10-CM | POA: Diagnosis not present

## 2024-03-31 ENCOUNTER — Telehealth: Payer: Self-pay | Admitting: Hematology & Oncology

## 2024-03-31 NOTE — Telephone Encounter (Signed)
 Called to schedule follow up per inbasket. LVM to return call for scheduling.

## 2024-04-01 ENCOUNTER — Encounter: Payer: Self-pay | Admitting: Hematology & Oncology

## 2024-04-01 DIAGNOSIS — C3490 Malignant neoplasm of unspecified part of unspecified bronchus or lung: Secondary | ICD-10-CM | POA: Diagnosis not present

## 2024-04-01 DIAGNOSIS — Z923 Personal history of irradiation: Secondary | ICD-10-CM | POA: Diagnosis not present

## 2024-04-01 DIAGNOSIS — C7931 Secondary malignant neoplasm of brain: Secondary | ICD-10-CM | POA: Diagnosis not present

## 2024-04-02 DIAGNOSIS — N186 End stage renal disease: Secondary | ICD-10-CM | POA: Diagnosis not present

## 2024-04-02 DIAGNOSIS — N2581 Secondary hyperparathyroidism of renal origin: Secondary | ICD-10-CM | POA: Diagnosis not present

## 2024-04-02 DIAGNOSIS — Z992 Dependence on renal dialysis: Secondary | ICD-10-CM | POA: Diagnosis not present

## 2024-04-05 DIAGNOSIS — N2581 Secondary hyperparathyroidism of renal origin: Secondary | ICD-10-CM | POA: Diagnosis not present

## 2024-04-05 DIAGNOSIS — Z992 Dependence on renal dialysis: Secondary | ICD-10-CM | POA: Diagnosis not present

## 2024-04-05 DIAGNOSIS — N186 End stage renal disease: Secondary | ICD-10-CM | POA: Diagnosis not present

## 2024-04-06 ENCOUNTER — Inpatient Hospital Stay (HOSPITAL_BASED_OUTPATIENT_CLINIC_OR_DEPARTMENT_OTHER): Admitting: Family

## 2024-04-06 ENCOUNTER — Encounter: Payer: Self-pay | Admitting: Family

## 2024-04-06 ENCOUNTER — Inpatient Hospital Stay: Attending: Radiation Oncology

## 2024-04-06 ENCOUNTER — Inpatient Hospital Stay

## 2024-04-06 VITALS — BP 146/49 | HR 81 | Resp 18

## 2024-04-06 VITALS — BP 132/56 | HR 84 | Temp 98.2°F | Resp 17 | Wt 114.4 lb

## 2024-04-06 DIAGNOSIS — Z9221 Personal history of antineoplastic chemotherapy: Secondary | ICD-10-CM

## 2024-04-06 DIAGNOSIS — C3491 Malignant neoplasm of unspecified part of right bronchus or lung: Secondary | ICD-10-CM | POA: Diagnosis not present

## 2024-04-06 DIAGNOSIS — C7931 Secondary malignant neoplasm of brain: Secondary | ICD-10-CM | POA: Diagnosis not present

## 2024-04-06 DIAGNOSIS — D631 Anemia in chronic kidney disease: Secondary | ICD-10-CM

## 2024-04-06 DIAGNOSIS — Z5112 Encounter for antineoplastic immunotherapy: Secondary | ICD-10-CM | POA: Diagnosis not present

## 2024-04-06 DIAGNOSIS — C3411 Malignant neoplasm of upper lobe, right bronchus or lung: Secondary | ICD-10-CM

## 2024-04-06 DIAGNOSIS — N186 End stage renal disease: Secondary | ICD-10-CM

## 2024-04-06 DIAGNOSIS — Z923 Personal history of irradiation: Secondary | ICD-10-CM | POA: Insufficient documentation

## 2024-04-06 DIAGNOSIS — T451X5A Adverse effect of antineoplastic and immunosuppressive drugs, initial encounter: Secondary | ICD-10-CM

## 2024-04-06 LAB — CBC WITH DIFFERENTIAL (CANCER CENTER ONLY)
Abs Immature Granulocytes: 0.04 K/uL (ref 0.00–0.07)
Basophils Absolute: 0 K/uL (ref 0.0–0.1)
Basophils Relative: 0 %
Eosinophils Absolute: 0 K/uL (ref 0.0–0.5)
Eosinophils Relative: 0 %
HCT: 32.8 % — ABNORMAL LOW (ref 36.0–46.0)
Hemoglobin: 10.4 g/dL — ABNORMAL LOW (ref 12.0–15.0)
Immature Granulocytes: 1 %
Lymphocytes Relative: 8 %
Lymphs Abs: 0.7 K/uL (ref 0.7–4.0)
MCH: 30.3 pg (ref 26.0–34.0)
MCHC: 31.7 g/dL (ref 30.0–36.0)
MCV: 95.6 fL (ref 80.0–100.0)
Monocytes Absolute: 0.5 K/uL (ref 0.1–1.0)
Monocytes Relative: 6 %
Neutro Abs: 7.1 K/uL (ref 1.7–7.7)
Neutrophils Relative %: 85 %
Platelet Count: 183 K/uL (ref 150–400)
RBC: 3.43 MIL/uL — ABNORMAL LOW (ref 3.87–5.11)
RDW: 17.1 % — ABNORMAL HIGH (ref 11.5–15.5)
WBC Count: 8.3 K/uL (ref 4.0–10.5)
nRBC: 0 % (ref 0.0–0.2)

## 2024-04-06 LAB — CMP (CANCER CENTER ONLY)
ALT: 7 U/L (ref 0–44)
AST: 16 U/L (ref 15–41)
Albumin: 4 g/dL (ref 3.5–5.0)
Alkaline Phosphatase: 73 U/L (ref 38–126)
Anion gap: 15 (ref 5–15)
BUN: 22 mg/dL (ref 8–23)
CO2: 28 mmol/L (ref 22–32)
Calcium: 8.7 mg/dL — ABNORMAL LOW (ref 8.9–10.3)
Chloride: 96 mmol/L — ABNORMAL LOW (ref 98–111)
Creatinine: 5.02 mg/dL — ABNORMAL HIGH (ref 0.44–1.00)
GFR, Estimated: 9 mL/min — ABNORMAL LOW (ref >60)
Glucose, Bld: 201 mg/dL — ABNORMAL HIGH (ref 70–99)
Potassium: 3.9 mmol/L (ref 3.5–5.1)
Sodium: 139 mmol/L (ref 135–145)
Total Bilirubin: 0.3 mg/dL (ref 0.0–1.2)
Total Protein: 6.7 g/dL (ref 6.5–8.1)

## 2024-04-06 LAB — SAMPLE TO BLOOD BANK

## 2024-04-06 LAB — LACTATE DEHYDROGENASE: LDH: 232 U/L — ABNORMAL HIGH (ref 98–192)

## 2024-04-06 MED ORDER — DARBEPOETIN ALFA 300 MCG/0.6ML IJ SOSY: 300.0000 ug | PREFILLED_SYRINGE | Freq: Once | INTRAMUSCULAR | Status: DC

## 2024-04-06 MED ORDER — OLANZAPINE 10 MG PO TABS: 10.0000 mg | ORAL_TABLET | Freq: Every day | ORAL | 2 refills | Status: DC

## 2024-04-06 MED ORDER — SODIUM CHLORIDE 0.9 % IV SOLN: Freq: Once | INTRAVENOUS | Status: AC

## 2024-04-06 MED ORDER — SODIUM CHLORIDE 0.9 % IV SOLN
1200.0000 mg | Freq: Once | INTRAVENOUS | Status: AC
  Administered 2024-04-06: 1200 mg via INTRAVENOUS
  Filled 2024-04-06: qty 20

## 2024-04-06 NOTE — Patient Instructions (Signed)
 Atezolizumab  Injection What is this medication? ATEZOLIZUMAB  (a te zoe LIZ ue mab) treats some types of cancer. It works by helping your immune system slow or stop the spread of cancer cells. It is a monoclonal antibody. This medicine may be used for other purposes; ask your health care provider or pharmacist if you have questions. COMMON BRAND NAME(S): Tecentriq  What should I tell my care team before I take this medication? They need to know if you have any of these conditions: Allogeneic stem cell transplant (uses someone else's stem cells) Autoimmune diseases, such as Crohn disease, ulcerative colitis, lupus History of chest radiation Nervous system problems, such as Guillain-Barre syndrome, myasthenia gravis Organ transplant An unusual or allergic reaction to atezolizumab , other medications, foods, dyes, or preservatives Pregnant or trying to get pregnant Breast-feeding How should I use this medication? This medication is injected into a vein. It is given by your care team in a hospital or clinic setting. A special MedGuide will be given to you before each treatment. Be sure to read this information carefully each time. Talk to your care team about the use of this medication in children. While it may be prescribed for children as young as 2 years for selected conditions, precautions do apply. Overdosage: If you think you have taken too much of this medicine contact a poison control center or emergency room at once. NOTE: This medicine is only for you. Do not share this medicine with others. What if I miss a dose? Keep appointments for follow-up doses. It is important not to miss your dose. Call your care team if you are unable to keep an appointment. What may interact with this medication? Interactions have not been studied. This list may not describe all possible interactions. Give your health care provider a list of all the medicines, herbs, non-prescription drugs, or dietary  supplements you use. Also tell them if you smoke, drink alcohol, or use illegal drugs. Some items may interact with your medicine. What should I watch for while using this medication? Your condition will be monitored carefully while you are receiving this medication. You may need blood work done while you are taking this medication. This medication may cause serious skin reactions. They can happen weeks to months after starting the medication. Contact your care team right away if you notice fevers or flu-like symptoms with a rash. The rash may be red or purple and then turn into blisters or peeling of the skin. You may also notice a red rash with swelling of the face, lips, or lymph nodes in your neck or under your arms. Talk to your care team if you may be pregnant. Serious birth defects can occur if you take this medication during pregnancy and for 5 months after the last dose. You will need a negative pregnancy test before starting this medication. Contraception is recommended while taking this medication and for 5 months after the last dose. Your care team can help you find the option that works for you. Do not breastfeed while taking this medication and for 5 months after the last dose. This medication may cause infertility. Talk to your care team if you are concerned about your fertility. What side effects may I notice from receiving this medication? Side effects that you should report to your doctor or health care professional as soon as possible: Allergic reactions--skin rash, itching, hives, swelling of the face, lips, tongue, or throat Dry cough, shortness of breath or trouble breathing Eye pain, redness, irritation, or discharge  with blurry or decreased vision Heart muscle inflammation--unusual weakness or fatigue, shortness of breath, chest pain, fast or irregular heartbeat, dizziness, swelling of the ankles, feet, or hands Hormone gland problems--headache, sensitivity to light, unusual  weakness or fatigue, dizziness, fast or irregular heartbeat, increased sensitivity to cold or heat, excessive sweating, constipation, hair loss, increased thirst or amount of urine, tremors or shaking, irritability Infusion reactions--chest pain, shortness of breath or trouble breathing, feeling faint or lightheaded Kidney injury (glomerulonephritis)--decrease in the amount of urine, red or dark brown urine, foamy or bubbly urine, swelling of the ankles, hands, or feet Liver injury--right upper belly pain, loss of appetite, nausea, light-colored stool, dark yellow or brown urine, yellowing skin or eyes, unusual weakness or fatigue Pain, tingling, or numbness in the hands or feet, muscle weakness, change in vision, confusion or trouble speaking, loss of balance or coordination, trouble walking, seizures Rash, fever, and swollen lymph nodes Redness, blistering, peeling, or loosening of the skin, including inside the mouth Sudden or severe stomach pain, bloody diarrhea, fever, nausea, vomiting Side effects that usually do not require medical attention (report to your doctor or health care professional if they continue or are bothersome): Bone, joint, or muscle pain Diarrhea Fatigue Loss of appetite Nausea Skin rash This list may not describe all possible side effects. Call your doctor for medical advice about side effects. You may report side effects to FDA at 1-800-FDA-1088. Where should I keep my medication? This medication is given in a hospital or clinic. It will not be stored at home. NOTE: This sheet is a summary. It may not cover all possible information. If you have questions about this medicine, talk to your doctor, pharmacist, or health care provider.  2024 Elsevier/Gold Standard (2023-04-30 00:00:00)

## 2024-04-06 NOTE — Progress Notes (Signed)
 Cbc and cmet reviewed by MD. SCR 5.02. VO  ok to treat depsite counts.   Pt also on Dialysis

## 2024-04-06 NOTE — Progress Notes (Signed)
 Hematology and Oncology Follow Up Visit  Amber Cross [id number] 1961-04-25 63 y.o. 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   Prior Therapy:            Status post radiosurgery-completed on 01/31/2022 Carboplatinum/etoposide /Tecentriq  --s/p cycle #4 -- start on 05/07/2023 CNS radiotherapy-completed  at [facility] regional on 02/18/2024- -- 3000 Gy   Current Therapy: Tecentriq  1200 mg IV q 3 weeks - maintenance -- start on 09/23/2023  Aranesp  300 mcg subcu every 3 weeks for hemoglobin less than 11              Interim History:  Amber Cross is here today for follow-up and treatment. She is doing fairly well since having CNS radiotherapy with [facility]. Repeat MRI last week showed no evidence of progressive metastatic disease. She follows up with radiation oncology again in 3 months.   She notes fatigue as well as nausea.  She has tried Zofran  but this persists. I discussed with Dr. Timmy. We will have her try Zyprexa  10 mg daily.  She is trying to eat but the fatigue and nausea have decreased her appetite. Weight is 114 lbs (previously 115 lbs).  No fever, chills, cough, rash, dizziness, SOB, chest pain, palpitations, abdominal pain or changes in bowel or bladder habits.  She is doing hemodialysis M,W,F each week. She makes urine once a day typically.  No swelling noted. She states that with fluid restrictions she stays dry.  No falls or syncope reported.  No blood loss, abnormal bruising or petechiae noted.   ECOG Performance Status: 1 - Symptomatic but completely ambulatory  Medications:  Allergies as of 04/06/2024       Reactions   Penicillins Itching, Other (See Comments)   redness   Strawberry (diagnostic) Itching, Swelling   Tomato Itching, Swelling        Medication List        Accurate as of April 06, 2024 11:57 AM. If you have any  questions, ask your nurse or doctor.          Adjustable Lancing Device Misc   amlodipine -olmesartan  10-20 MG tablet Commonly known as: AZOR  Take 1 tablet by mouth at bedtime.   atorvastatin  80 MG tablet Commonly known as: LIPITOR  Take 1 tablet (80 mg total) by mouth daily.   Calcium  Acetate 668 (169 Ca) MG Tabs Take 1 tablet by mouth 3 (three) times daily.   cephALEXin  250 MG capsule Commonly known as: KEFLEX  Take 1 capsule (250 mg total) by mouth daily.   dronabinol  10 MG capsule Commonly known as: MARINOL  Take 1 capsule (10 mg total) by mouth daily before lunch.   fluticasone  50 MCG/ACT nasal spray Commonly known as: FLONASE  SPRAY 2 SPRAYS INTO EACH NOSTRIL EVERY DAY   furosemide  40 MG tablet Commonly known as: Lasix  Take 1 tablet (40 mg total) by mouth daily as needed (swelling in legs or weight gain).   gabapentin  100 MG capsule Commonly known as: NEURONTIN  Take 1 capsule (100 mg total) by mouth daily.   HECTOROL  IV Take 3 mcg by mouth 3 (three) times a week.   hydrALAZINE  100 MG tablet Commonly known as: APRESOLINE  Take 1 tablet (100 mg total) by mouth 2 (two) times daily.   ondansetron  8 MG tablet Commonly known as: ZOFRAN  Take 1 tablet (8 mg total) by mouth every 8 (eight) hours as needed for nausea or vomiting.  pantoprazole  40 MG tablet Commonly known as: PROTONIX  Take 40 mg by mouth daily.   prednisoLONE acetate 1 % ophthalmic suspension Commonly known as: PRED FORTE Place 1 drop into the left eye 4 (four) times daily.   predniSONE  20 MG tablet Commonly known as: DELTASONE  Take 1 tablet (20 mg total) by mouth daily with breakfast.   sevelamer  carbonate 800 MG tablet Commonly known as: RENVELA  Take 800 mg by mouth 3 (three) times daily with meals.   traZODone  50 MG tablet Commonly known as: DESYREL  Take 1 tablet (50 mg total) by mouth at bedtime.        Allergies:  Allergies  Allergen Reactions   Penicillins Itching and Other  (See Comments)    redness   Strawberry (Diagnostic) Itching and Swelling   Tomato Itching and Swelling    Past Medical History, Surgical history, Social history, and Family History were reviewed and updated.  Review of Systems: All other 10 point review of systems is negative.   Physical Exam:  vitals were not taken for this visit.   Wt Readings from Last 3 Encounters:  03/17/24 115 lb 9.6 oz (52.4 kg)  03/09/24 116 lb (52.6 kg)  03/04/24 116 lb (52.6 kg)    Ocular: Sclerae unicteric, pupils equal, round and reactive to light Ear-nose-throat: Oropharynx clear, dentition fair Lymphatic: No cervical or supraclavicular adenopathy Lungs no rales or rhonchi, good excursion bilaterally Heart regular rate and rhythm, no murmur appreciated Abd soft, nontender, positive bowel sounds MSK no focal spinal tenderness, no joint edema Neuro: non-focal, well-oriented, appropriate affect Breasts: Deferred   Lab Results  Component Value Date   WBC 8.3 04/06/2024   HGB 10.4 (L) 04/06/2024   HCT 32.8 (L) 04/06/2024   MCV 95.6 04/06/2024   PLT 183 04/06/2024   Lab Results  Component Value Date   FERRITIN 1,212 (H) 11/25/2023   IRON 119 11/25/2023   TIBC 281 11/25/2023   UIBC 162 11/25/2023   IRONPCTSAT 42 (H) 11/25/2023   Lab Results  Component Value Date   RETICCTPCT 1.9 01/08/2023   RBC 3.43 (L) 04/06/2024   Lab Results  Component Value Date   KPAFRELGTCHN 123.1 (H) 01/31/2022   LAMBDASER 36.3 (H) 01/31/2022   KAPLAMBRATIO 3.39 (H) 01/31/2022   Lab Results  Component Value Date   IGGSERUM 768 05/09/2021   IGA 150 05/09/2021   IGMSERUM 60 05/09/2021   Lab Results  Component Value Date   TOTALPROTELP 5.3 (L) 01/31/2022   ALBUMINELP 1.8 (L) 01/31/2022   A1GS 0.4 01/31/2022   A2GS 1.3 (H) 01/31/2022   BETS 1.0 01/31/2022   GAMS 0.8 01/31/2022   MSPIKE Not Observed 01/31/2022   SPEI Comment 01/31/2022     Chemistry      Component Value Date/Time   NA 141  03/04/2024 0921   NA 141 03/11/2023 1220   K 3.8 03/04/2024 0921   CL 98 03/04/2024 0921   CO2 28 03/04/2024 0921   BUN 22 03/04/2024 0921   BUN 26 03/11/2023 1220   CREATININE 5.09 (H) 03/04/2024 0921      Component Value Date/Time   CALCIUM  9.6 03/04/2024 0921   ALKPHOS 85 03/04/2024 0921   AST 15 03/04/2024 0921   ALT 8 03/04/2024 0921   BILITOT 0.2 03/04/2024 0921       Impression and Plan: Ms. Hanover is a very nice 63 yo African American female with limited stage small cell lung cancer in addition to ESRD.  She had CNS radiotherapy in July  and repeat MRI showed no evidence of progressive metastatic disease.  We will proceed with Tecentriq  and ESA treatment today as planned.  She will give Zyprexa  a try and hopefully this will help prevent any nausea and help with improved appetite and weight gain.  Follow-up in 3 weeks.   Lauraine Pepper, NP 9/9/202511:57 AM

## 2024-04-07 ENCOUNTER — Inpatient Hospital Stay

## 2024-04-07 VITALS — BP 150/65 | HR 87 | Temp 97.8°F | Resp 18

## 2024-04-07 DIAGNOSIS — C3491 Malignant neoplasm of unspecified part of right bronchus or lung: Secondary | ICD-10-CM | POA: Diagnosis not present

## 2024-04-07 DIAGNOSIS — N186 End stage renal disease: Secondary | ICD-10-CM | POA: Diagnosis not present

## 2024-04-07 DIAGNOSIS — Z923 Personal history of irradiation: Secondary | ICD-10-CM | POA: Diagnosis not present

## 2024-04-07 DIAGNOSIS — Z5112 Encounter for antineoplastic immunotherapy: Secondary | ICD-10-CM | POA: Diagnosis not present

## 2024-04-07 DIAGNOSIS — N184 Chronic kidney disease, stage 4 (severe): Secondary | ICD-10-CM

## 2024-04-07 DIAGNOSIS — D631 Anemia in chronic kidney disease: Secondary | ICD-10-CM | POA: Diagnosis not present

## 2024-04-07 DIAGNOSIS — N2581 Secondary hyperparathyroidism of renal origin: Secondary | ICD-10-CM | POA: Diagnosis not present

## 2024-04-07 DIAGNOSIS — Z9221 Personal history of antineoplastic chemotherapy: Secondary | ICD-10-CM | POA: Diagnosis not present

## 2024-04-07 DIAGNOSIS — C7931 Secondary malignant neoplasm of brain: Secondary | ICD-10-CM | POA: Diagnosis not present

## 2024-04-07 DIAGNOSIS — Z992 Dependence on renal dialysis: Secondary | ICD-10-CM | POA: Diagnosis not present

## 2024-04-07 MED ORDER — DARBEPOETIN ALFA 300 MCG/0.6ML IJ SOSY
300.0000 ug | PREFILLED_SYRINGE | Freq: Once | INTRAMUSCULAR | Status: AC
  Administered 2024-04-07: 300 ug via SUBCUTANEOUS
  Filled 2024-04-07: qty 0.6

## 2024-04-12 DIAGNOSIS — Z992 Dependence on renal dialysis: Secondary | ICD-10-CM | POA: Diagnosis not present

## 2024-04-12 DIAGNOSIS — N186 End stage renal disease: Secondary | ICD-10-CM | POA: Diagnosis not present

## 2024-04-12 DIAGNOSIS — N2581 Secondary hyperparathyroidism of renal origin: Secondary | ICD-10-CM | POA: Diagnosis not present

## 2024-04-14 DIAGNOSIS — N186 End stage renal disease: Secondary | ICD-10-CM | POA: Diagnosis not present

## 2024-04-15 NOTE — Progress Notes (Signed)
 [facility] - Clinic Note  04/21/2024     CHIEF COMPLAINT Patient presents for Retina Follow Up   HISTORY OF PRESENT ILLNESS: Amber Cross is a 63 y.o. female who presents to the clinic today for:  HPI     Retina Follow Up   Patient presents with  Diabetic Retinopathy.  In both eyes.  Severity is moderate.  Duration of 5.  Since onset it is stable.  I, the attending physician,  performed the HPI with the patient and updated documentation appropriately.        Comments   Pt here for 5 wk ret f/u NPDR OU. Pt states Va is about the same, still seeing black floaters. Last A1C in April was 7.0      Last edited by Valdemar Rogue, MD on 04/21/2024  5:05 PM.     Pt states she sees the black spots when she gets close to coming into the office for a shot.  Referring physician: Clem Brands, PA-C  [facility] [address] [place],  [place] [address]  HISTORICAL INFORMATION:  Selected notes from the MEDICAL RECORD NUMBER Referred by Clem Brands, PA-C for concern of CME / PDR LEE:  Ocular Hx- PMH-   CURRENT MEDICATIONS: Current Outpatient Medications (Ophthalmic Drugs)  Medication Sig   prednisoLONE acetate (PRED FORTE) 1 % ophthalmic suspension Place 1 drop into the left eye 4 (four) times daily. (Patient not taking: Reported on 04/21/2024)   No current facility-administered medications for this visit. (Ophthalmic Drugs)   Current Outpatient Medications (Other)  Medication Sig   amlodipine -olmesartan  (AZOR ) 10-20 MG tablet Take 1 tablet by mouth at bedtime.   atorvastatin  (LIPITOR ) 80 MG tablet Take 1 tablet (80 mg total) by mouth daily.   Calcium  Acetate 668 (169 Ca) MG TABS Take 1 tablet by mouth 3 (three) times daily.   Doxercalciferol  (HECTOROL  IV) Take 3 mcg by mouth 3 (three) times a week.   fluticasone  (FLONASE ) 50 MCG/ACT nasal spray SPRAY 2 SPRAYS INTO EACH NOSTRIL EVERY DAY   gabapentin  (NEURONTIN ) 100 MG capsule  Take 1 capsule (100 mg total) by mouth daily.   hydrALAZINE  (APRESOLINE ) 100 MG tablet Take 1 tablet (100 mg total) by mouth 2 (two) times daily.   Lancet Devices (ADJUSTABLE LANCING DEVICE) MISC    OLANZapine  (ZYPREXA ) 10 MG tablet Take 1 tablet (10 mg total) by mouth at bedtime.   ondansetron  (ZOFRAN ) 8 MG tablet Take 1 tablet (8 mg total) by mouth every 8 (eight) hours as needed for nausea or vomiting.   sevelamer  carbonate (RENVELA ) 800 MG tablet Take 800 mg by mouth 3 (three) times daily with meals.   cephALEXin  (KEFLEX ) 250 MG capsule Take 1 capsule (250 mg total) by mouth daily. (Patient not taking: Reported on 04/21/2024)   dronabinol  (MARINOL ) 10 MG capsule Take 1 capsule (10 mg total) by mouth daily before lunch. (Patient not taking: Reported on 04/21/2024)   furosemide  (LASIX ) 40 MG tablet Take 1 tablet (40 mg total) by mouth daily as needed (swelling in legs or weight gain). (Patient not taking: Reported on 04/21/2024)   pantoprazole  (PROTONIX ) 40 MG tablet Take 40 mg by mouth daily. (Patient not taking: Reported on 04/21/2024)   predniSONE  (DELTASONE ) 20 MG tablet Take 1 tablet (20 mg total) by mouth daily with breakfast. (Patient not taking: Reported on 04/21/2024)   traZODone  (DESYREL ) 50 MG tablet TAKE 1 TABLET BY MOUTH EVERYDAY AT BEDTIME   No current facility-administered  medications for this visit. (Other)   REVIEW OF SYSTEMS:   ALLERGIES Allergies  Allergen Reactions   Penicillins Itching and Other (See Comments)    redness   Strawberry (Diagnostic) Itching and Swelling   Tomato Itching and Swelling   PAST MEDICAL HISTORY Past Medical History:  Diagnosis Date   Anemia    Anemia of chronic renal failure, stage 4 (severe) (HCC) 07/08/2023   Arthritis    Chest pain 05/07/2021   CHF (congestive heart failure) (HCC)    Chronic kidney disease    dialysis M-W-F   Depression    Diabetes mellitus    Headache    Hypercholesteremia    Hypertension    Hypertensive  emergency 06/16/2022   Lung cancer (HCC) 12/18/2021   RUL small cell carcinoma, s/p radiation   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   Postop check 08/05/2022   Pseudogout    Stroke (HCC) 10/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   Tamponade 06/18/2022   Tobacco abuse    Past Surgical History:  Procedure Laterality Date   ANTERIOR CRUCIATE LIGAMENT REPAIR Right    AV FISTULA PLACEMENT Left 07/01/2022   Procedure: LEFT ARTERIOVENOUS (AV) FISTULA CREATION;  Surgeon: Eliza Lonni RAMAN, MD;  Location: [facility] OR;  Service: Vascular;  Laterality: Left;  with regional block   BRONCHIAL BIOPSY  12/18/2021   Procedure: BRONCHIAL BIOPSIES;  Surgeon: Brenna Adine CROME, DO;  Location: [facility] ENDOSCOPY;  Service: Pulmonary;;   BRONCHIAL BRUSHINGS  12/18/2021   Procedure: BRONCHIAL BRUSHINGS;  Surgeon: Brenna Adine CROME, DO;  Location: [facility];  Service: Pulmonary;;   BRONCHIAL NEEDLE ASPIRATION BIOPSY  12/18/2021   Procedure: BRONCHIAL NEEDLE ASPIRATION BIOPSIES;  Surgeon: Brenna Adine CROME, DO;  Location: [facility] ENDOSCOPY;  Service: Pulmonary;;   CATARACT EXTRACTION     CHEST TUBE INSERTION Right 06/18/2022   Procedure: CHEST TUBE INSERTION;  Surgeon: Obadiah Coy, MD;  Location: [facility] OR;  Service: Thoracic;  Laterality: Right;   EXCHANGE OF A DIALYSIS CATHETER N/A 08/08/2022   Procedure: EXCHANGE OF A TUNNELED DIALYSIS CATHETER;  Surgeon: Serene Gaile ORN, MD;  Location: [facility] OR;  Service: Vascular;  Laterality: N/A;   FIDUCIAL MARKER PLACEMENT  12/18/2021   Procedure: FIDUCIAL MARKER PLACEMENT;  Surgeon: Brenna Adine CROME, DO;  Location: [facility];  Service: Pulmonary;;   IR FLUORO GUIDE CV LINE RIGHT  06/26/2022   IR US  GUIDE VASC ACCESS RIGHT  06/26/2022   OVARY SURGERY     RIGHT OOPHORECTOMY Right    SMALL INTESTINE SURGERY     SUBXYPHOID PERICARDIAL WINDOW N/A 06/18/2022   Procedure: SUBXYPHOID PERICARDIAL WINDOW;  Surgeon: Obadiah Coy, MD;  Location: [facility] OR;  Service: Thoracic;  Laterality: N/A;   TEE  WITHOUT CARDIOVERSION N/A 06/18/2022   Procedure: TRANSESOPHAGEAL ECHOCARDIOGRAM (TEE);  Surgeon: Obadiah Coy, MD;  Location: [facility] OR;  Service: Thoracic;  Laterality: N/A;   TUBAL LIGATION     VIDEO BRONCHOSCOPY WITH RADIAL ENDOBRONCHIAL ULTRASOUND  12/18/2021   Procedure: RADIAL ENDOBRONCHIAL ULTRASOUND;  Surgeon: Brenna Adine CROME, DO;  Location: [facility];  Service: Pulmonary;;   FAMILY HISTORY Family History  Problem Relation Age of Onset   Diabetes Mother    Hypertension Mother    Hypertension Father    SOCIAL HISTORY Social History   Tobacco Use   Smoking status: Every Day    Current packs/day: 0.50    Average packs/day: 0.5 packs/day for 40.0 years (20.0 ttl pk-yrs)    Types: Cigarettes   Smokeless tobacco: Never   Tobacco  comments:    Cutting back 1/4-1/2 ppd  Vaping Use   Vaping status: Never Used  Substance Use Topics   Alcohol use: Not Currently   Drug use: Yes    Frequency: 4.0 times per week    Types: Marijuana    Comment: smokes every other day      OPHTHALMIC EXAM:  Base Eye Exam     Visual Acuity (Snellen - Linear)       Right Left   Dist [place] 20/50 +1 20/40 +2   Dist ph [place] 20/30 -1 20/30 -2  Pt left glasses in car MS        Tonometry (Tonopen, 12:42 PM)       Right Left   Pressure 15 15         Pupils       Dark Light Shape React APD   Right 3 2 Round Brisk None   Left 3 2 Round Brisk None         Visual Fields (Counting fingers)       Left Right    Full Full         Extraocular Movement       Right Left    Full, Ortho Full, Ortho         Neuro/Psych     Oriented x3: Yes   Mood/Affect: Normal         Dilation     Both eyes: 1.0% Mydriacyl, 2.5% Phenylephrine  @ 12:43 PM           Slit Lamp and Fundus Exam     Slit Lamp Exam       Right Left   Lids/Lashes Dermatochalasis - upper lid, mild MGD Dermatochalasis - upper lid, mild MGD   Conjunctiva/Sclera nasal pingeucula, Melanosis nasal pingeucula,  mild Melanosis   Cornea arcus, well healed cataract wound, nasal LRI, trace Punctate epithelial erosions arcus, trace Punctate epithelial erosions, trace tear film debris, well healed cataract wound, fine endo pigment   Anterior Chamber deep and clear deep and clear, no cell or flare   Iris Round and dilated Round and dilated   Lens PC IOL in good position PC IOL in good position   Anterior Vitreous mild syneresis, Posterior vitreous detachment mild syneresis         Fundus Exam       Right Left   Disc Pink and Sharp, temporal pallor, temporal PPP Pink and Sharp, temporal pallor, temporal PPP, no NVD, focal hyperemia 1100   C/D Ratio 0.3 0.3   Macula Good foveal reflex, scattered MA, DBH and punctate exudates -- improved, trace cystic changes -- improved good foveal reflex, dense central cluster of exudates -- improved, scattered MA, DBH and central edema -- improved   Vessels attenuated, Tortuous, no NV attenuated, Tortuous, no NV   Periphery Attached, scattered MA Attached, rare MA           IMAGING AND PROCEDURES  Imaging and Procedures for 04/21/2024  OCT, Retina - OU - Both Eyes       Right Eye Quality was good. Central Foveal Thickness: 222. Progression has improved. Findings include normal foveal contour, no SRF, intraretinal hyper-reflective material, intraretinal fluid, pigment epithelial detachment (Interval improvement IRF/cystic changes greatest superior mac ).   Left Eye Quality was good. Central Foveal Thickness: 179. Progression has improved. Findings include no SRF, abnormal foveal contour, subretinal hyper-reflective material, intraretinal hyper-reflective material, intraretinal fluid, outer retinal atrophy (Mild interval improvement in IRF/IRHM greatest IT  macula ).   Notes *Images captured and stored on drive  Diagnosis / Impression:  DME OU OD: Interval improvement IRF/cystic changes greatest  superior mac  OS: Mild interval improvement in IRF/IRHM  greatest IT macula   Clinical management:  See below  Abbreviations: NFP - Normal foveal profile. CME - cystoid macular edema. PED - pigment epithelial detachment. IRF - intraretinal fluid. SRF - subretinal fluid. EZ - ellipsoid zone. ERM - epiretinal membrane. ORA - outer retinal atrophy. ORT - outer retinal tubulation. SRHM - subretinal hyper-reflective material. IRHM - intraretinal hyper-reflective material      Intravitreal Injection, Pharmacologic Agent - OD - Right Eye       Time Out 04/21/2024. 12:54 PM. Confirmed correct patient, procedure, site, and patient consented.   Anesthesia Topical anesthesia was used. Anesthetic medications included Lidocaine  2%, Proparacaine 0.5%.   Procedure Preparation included 5% betadine to ocular surface, eyelid speculum. A (32g) needle was used.   Injection: 2 mg aflibercept  2 MG/0.05ML   Route: Intravitreal, Site: Right Eye   NDC: Q956576, Lot: [id number], Expiration date: 05/28/2025, Waste: 0 mL   Post-op Post injection exam found visual acuity of at least counting fingers. The patient tolerated the procedure well. There were no complications. The patient received written and verbal post procedure care education.      Intravitreal Injection, Pharmacologic Agent - OS - Left Eye       Time Out 04/21/2024. 12:54 PM. Confirmed correct patient, procedure, site, and patient consented.   Anesthesia Topical anesthesia was used. Anesthetic medications included Lidocaine  2%, Proparacaine 0.5%.   Procedure Preparation included 5% betadine to ocular surface, eyelid speculum. A (32g) needle was used.   Injection: 2 mg aflibercept  2 MG/0.05ML   Route: Intravitreal, Site: Left Eye   NDC: Q956576, Lot: [id number], Expiration date: 05/28/2025, Waste: 0 mL   Post-op Post injection exam found visual acuity of at least counting fingers. The patient tolerated the procedure well. There were no complications. The patient received written  and verbal post procedure care education.            ASSESSMENT/PLAN:   ICD-10-CM   1. Severe nonproliferative diabetic retinopathy of both eyes with macular edema associated with type 2 diabetes mellitus (HCC)  E11.3413 OCT, Retina - OU - Both Eyes    Intravitreal Injection, Pharmacologic Agent - OD - Right Eye    Intravitreal Injection, Pharmacologic Agent - OS - Left Eye    aflibercept  (EYLEA ) SOLN 2 mg    aflibercept  (EYLEA ) SOLN 2 mg    2. Current use of insulin  (HCC)  Z79.4     3. Essential hypertension  I10     4. Hypertensive retinopathy of both eyes  H35.033     5. Pseudophakia, both eyes  Z96.1       1,2 Severe Non-proliferative diabetic retinopathy, both eyes  - A1C 7.8 (08.13.24), 10.4 (05.09.24), 6.7 (11.30.23) - s/p IVA OD #1 (05.02.24), #2 (05.30.24), #3 (07.24.24), #4 (08.21.24), #5 (10.08.24), #6 (11.05.24), #7 (12.10.24), #8 (01.14.25), #9 (02.18.25), #10 (03.25.25), #11 (04.29.25), #12 (06.03.25) - s/p IVA OS #1 (04.04.24), #2 (05.02.24), #3 (05.30.24), #4 (07.24.24), #5 (08.21.24) -- IVA resistance OS ====================== - s/p IVE OD #1 (07.23.25), #2 (08.19.25) - s/p IVE OS #1 (10.08.24), #2 (11.05.24), #3 (12.10.24), #4 (01.14.25), #5 (02.18.25), #6 (03.25.25), #7 (04.29.25), #8 (06.03.25), #9 (07.23.25), #10 (08.19.25) - FA (04.04.24) shows leaking MA greatest posteriorly, no NV OU - BCVA OD 20/30 from 20/30, OS 20/30 - stable - exam shows  scattered MA/DBH and exudates OU (OS > OD) - OCT shows OD: Interval improvement IRF/cystic changes greatest superior mac; OS: Mild interval improvement in IRF/IRHM greatest IT macula at 5 weeks - recommend IVE OD #3 and IVE OS #11 today, 09.24.25 for DME w/ f/u in 5 wks again - pt wishes to proceed - RBA of procedure discussed, questions answered - Eylea  approved with insurance - IVA informed consent obtained and signed, 04.04.24 (OU) - IVE informed consent obtained and signed, 10.08.24 (OU) - see procedure  note - f/u in 5 wks -- DFE/OCT, possible injection  3,4. Hypertensive retinopathy OU - discussed importance of tight BP control - monitor - pt reports baseline BP 160's/60's  5. Pseudophakia OU  - s/p CE/IOL OU (Dr. Octavia, OD: 2017, OS: 03.04.25)  - IOL in good position, doing well  - monitor  Ophthalmic Meds Ordered this visit:  Meds ordered this encounter  Medications   aflibercept  (EYLEA ) SOLN 2 mg   aflibercept  (EYLEA ) SOLN 2 mg    Return in about 5 weeks (around 05/26/2024) for f/u, NPDR, DFE, OCT, Possible, IVE, OU.  There are no Patient Instructions on file for this visit.  Explained the diagnoses, plan, and follow up with the patient and they expressed understanding.  Patient expressed understanding of the importance of proper follow up care.    This document serves as a record of services personally performed by Redell JUDITHANN Hans, MD, PhD. It was created on their behalf by Almetta Pesa, an ophthalmic technician. The creation of this record is the provider's dictation and/or activities during the visit.    Electronically signed by: Almetta Pesa, OA, 04/25/24  2:12 AM  This document serves as a record of services personally performed by Redell JUDITHANN Hans, MD, PhD. It was created on their behalf by Wanda GEANNIE Keens, COT an ophthalmic technician. The creation of this record is the provider's dictation and/or activities during the visit.    Electronically signed by:  Wanda GEANNIE Keens, COT  04/25/24 2:12 AM  Redell JUDITHANN Hans, M.D., Ph.D. Diseases & Surgery of the Retina and Vitreous [facility]  I have reviewed the above documentation for accuracy and completeness, and I agree with the above. Redell JUDITHANN Hans, M.D., Ph.D. 04/25/24 2:15 AM   Abbreviations: M myopia (nearsighted); A astigmatism; H hyperopia (farsighted); P presbyopia; Mrx spectacle prescription;  CTL contact lenses; OD right eye; OS left eye; OU both eyes  XT exotropia; ET  esotropia; PEK punctate epithelial keratitis; PEE punctate epithelial erosions; DES dry eye syndrome; MGD meibomian gland dysfunction; ATs artificial tears; PFAT's preservative free artificial tears; NSC nuclear sclerotic cataract; PSC posterior subcapsular cataract; ERM epi-retinal membrane; PVD posterior vitreous detachment; RD retinal detachment; DM diabetes mellitus; DR diabetic retinopathy; NPDR non-proliferative diabetic retinopathy; PDR proliferative diabetic retinopathy; CSME clinically significant macular edema; DME diabetic macular edema; dbh dot blot hemorrhages; CWS cotton wool spot; POAG primary open angle glaucoma; C/D cup-to-disc ratio; HVF humphrey visual field; GVF goldmann visual field; OCT optical coherence tomography; IOP intraocular pressure; BRVO Branch retinal vein occlusion; CRVO central retinal vein occlusion; CRAO central retinal artery occlusion; BRAO branch retinal artery occlusion; RT retinal tear; SB scleral buckle; PPV pars plana vitrectomy; VH Vitreous hemorrhage; PRP panretinal laser photocoagulation; IVK intravitreal kenalog; VMT vitreomacular traction; MH Macular hole;  NVD neovascularization of the disc; NVE neovascularization elsewhere; AREDS age related eye disease study; ARMD age related macular degeneration; POAG primary open angle glaucoma; EBMD epithelial/anterior basement membrane dystrophy; ACIOL anterior  chamber intraocular lens; IOL intraocular lens; PCIOL posterior chamber intraocular lens; Phaco/IOL phacoemulsification with intraocular lens placement; PRK photorefractive keratectomy; LASIK laser assisted in situ keratomileusis; HTN hypertension; DM diabetes mellitus; COPD chronic obstructive pulmonary disease

## 2024-04-16 DIAGNOSIS — N186 End stage renal disease: Secondary | ICD-10-CM | POA: Diagnosis not present

## 2024-04-19 DIAGNOSIS — N186 End stage renal disease: Secondary | ICD-10-CM | POA: Diagnosis not present

## 2024-04-21 ENCOUNTER — Encounter (INDEPENDENT_AMBULATORY_CARE_PROVIDER_SITE_OTHER): Payer: Self-pay | Admitting: Ophthalmology

## 2024-04-21 ENCOUNTER — Ambulatory Visit (INDEPENDENT_AMBULATORY_CARE_PROVIDER_SITE_OTHER): Admitting: Ophthalmology

## 2024-04-21 DIAGNOSIS — I10 Essential (primary) hypertension: Secondary | ICD-10-CM

## 2024-04-21 DIAGNOSIS — N186 End stage renal disease: Secondary | ICD-10-CM | POA: Diagnosis not present

## 2024-04-21 DIAGNOSIS — Z794 Long term (current) use of insulin: Secondary | ICD-10-CM

## 2024-04-21 DIAGNOSIS — E113413 Type 2 diabetes mellitus with severe nonproliferative diabetic retinopathy with macular edema, bilateral: Secondary | ICD-10-CM

## 2024-04-21 DIAGNOSIS — Z961 Presence of intraocular lens: Secondary | ICD-10-CM | POA: Diagnosis not present

## 2024-04-21 DIAGNOSIS — H35033 Hypertensive retinopathy, bilateral: Secondary | ICD-10-CM | POA: Diagnosis not present

## 2024-04-21 DIAGNOSIS — N2581 Secondary hyperparathyroidism of renal origin: Secondary | ICD-10-CM | POA: Diagnosis not present

## 2024-04-21 DIAGNOSIS — Z992 Dependence on renal dialysis: Secondary | ICD-10-CM | POA: Diagnosis not present

## 2024-04-21 MED ORDER — AFLIBERCEPT 2MG/0.05ML IZ SOLN FOR KALEIDOSCOPE
2.0000 mg | INTRAVITREAL | Status: AC | PRN
  Administered 2024-04-21: 2 mg via INTRAVITREAL

## 2024-04-22 ENCOUNTER — Other Ambulatory Visit: Payer: Self-pay

## 2024-04-22 ENCOUNTER — Other Ambulatory Visit: Payer: Self-pay | Admitting: Student

## 2024-04-22 DIAGNOSIS — I5022 Chronic systolic (congestive) heart failure: Secondary | ICD-10-CM

## 2024-04-22 DIAGNOSIS — N186 End stage renal disease: Secondary | ICD-10-CM

## 2024-04-22 NOTE — Patient Outreach (Signed)
 Complex Care Management   Visit Note  04/22/2024  Name:  Jaliza Seifried MRN: [id number] DOB: [birth year]  Situation: RNCM called to complete a follow up assessment. Patient answered the phone and states she is on the other line and place RNCM on hold. After noticing call disconnected, RNCM called back. Unable to reach.   Follow Up Plan:   RNCM will continue outreach attempts  Heddy Shutter, RN, MSN, BSN, CCM [place]  [facility], Population Health Case Manager Phone: [phone]

## 2024-04-23 DIAGNOSIS — N186 End stage renal disease: Secondary | ICD-10-CM | POA: Diagnosis not present

## 2024-04-26 DIAGNOSIS — N186 End stage renal disease: Secondary | ICD-10-CM | POA: Diagnosis not present

## 2024-04-27 ENCOUNTER — Encounter: Payer: Self-pay | Admitting: Hematology & Oncology

## 2024-04-27 ENCOUNTER — Inpatient Hospital Stay (HOSPITAL_BASED_OUTPATIENT_CLINIC_OR_DEPARTMENT_OTHER): Admitting: Hematology & Oncology

## 2024-04-27 ENCOUNTER — Other Ambulatory Visit: Payer: Self-pay

## 2024-04-27 ENCOUNTER — Inpatient Hospital Stay

## 2024-04-27 VITALS — BP 152/53 | HR 79

## 2024-04-27 VITALS — BP 147/62 | HR 80 | Temp 98.6°F | Resp 18 | Ht 67.0 in | Wt 112.0 lb

## 2024-04-27 DIAGNOSIS — C3411 Malignant neoplasm of upper lobe, right bronchus or lung: Secondary | ICD-10-CM

## 2024-04-27 DIAGNOSIS — I129 Hypertensive chronic kidney disease with stage 1 through stage 4 chronic kidney disease, or unspecified chronic kidney disease: Secondary | ICD-10-CM | POA: Diagnosis not present

## 2024-04-27 DIAGNOSIS — Z9221 Personal history of antineoplastic chemotherapy: Secondary | ICD-10-CM | POA: Diagnosis not present

## 2024-04-27 DIAGNOSIS — C3491 Malignant neoplasm of unspecified part of right bronchus or lung: Secondary | ICD-10-CM | POA: Diagnosis not present

## 2024-04-27 DIAGNOSIS — Z5112 Encounter for antineoplastic immunotherapy: Secondary | ICD-10-CM | POA: Diagnosis not present

## 2024-04-27 DIAGNOSIS — R112 Nausea with vomiting, unspecified: Secondary | ICD-10-CM

## 2024-04-27 DIAGNOSIS — N186 End stage renal disease: Secondary | ICD-10-CM | POA: Diagnosis not present

## 2024-04-27 DIAGNOSIS — Z992 Dependence on renal dialysis: Secondary | ICD-10-CM | POA: Diagnosis not present

## 2024-04-27 DIAGNOSIS — D631 Anemia in chronic kidney disease: Secondary | ICD-10-CM | POA: Diagnosis not present

## 2024-04-27 DIAGNOSIS — Z923 Personal history of irradiation: Secondary | ICD-10-CM | POA: Diagnosis not present

## 2024-04-27 DIAGNOSIS — C7931 Secondary malignant neoplasm of brain: Secondary | ICD-10-CM | POA: Diagnosis not present

## 2024-04-27 LAB — CBC WITH DIFFERENTIAL (CANCER CENTER ONLY)
Abs Immature Granulocytes: 0.01 K/uL (ref 0.00–0.07)
Basophils Absolute: 0 K/uL (ref 0.0–0.1)
Basophils Relative: 1 %
Eosinophils Absolute: 0.1 K/uL (ref 0.0–0.5)
Eosinophils Relative: 1 %
HCT: 37.8 % (ref 36.0–46.0)
Hemoglobin: 11.9 g/dL — ABNORMAL LOW (ref 12.0–15.0)
Immature Granulocytes: 0 %
Lymphocytes Relative: 32 %
Lymphs Abs: 1.5 K/uL (ref 0.7–4.0)
MCH: 30.9 pg (ref 26.0–34.0)
MCHC: 31.5 g/dL (ref 30.0–36.0)
MCV: 98.2 fL (ref 80.0–100.0)
Monocytes Absolute: 0.4 K/uL (ref 0.1–1.0)
Monocytes Relative: 8 %
Neutro Abs: 2.7 K/uL (ref 1.7–7.7)
Neutrophils Relative %: 58 %
Platelet Count: 168 K/uL (ref 150–400)
RBC: 3.85 MIL/uL — ABNORMAL LOW (ref 3.87–5.11)
RDW: 18 % — ABNORMAL HIGH (ref 11.5–15.5)
WBC Count: 4.7 K/uL (ref 4.0–10.5)
nRBC: 0 % (ref 0.0–0.2)

## 2024-04-27 LAB — CMP (CANCER CENTER ONLY)
ALT: 6 U/L (ref 0–44)
AST: 15 U/L (ref 15–41)
Albumin: 4 g/dL (ref 3.5–5.0)
Alkaline Phosphatase: 67 U/L (ref 38–126)
Anion gap: 14 (ref 5–15)
BUN: 15 mg/dL (ref 8–23)
CO2: 30 mmol/L (ref 22–32)
Calcium: 9.2 mg/dL (ref 8.9–10.3)
Chloride: 97 mmol/L — ABNORMAL LOW (ref 98–111)
Creatinine: 4.98 mg/dL — ABNORMAL HIGH (ref 0.44–1.00)
GFR, Estimated: 9 mL/min — ABNORMAL LOW (ref >60)
Glucose, Bld: 92 mg/dL (ref 70–99)
Potassium: 3.7 mmol/L (ref 3.5–5.1)
Sodium: 141 mmol/L (ref 135–145)
Total Bilirubin: 0.4 mg/dL (ref 0.0–1.2)
Total Protein: 6.7 g/dL (ref 6.5–8.1)

## 2024-04-27 LAB — SAVE SMEAR(SSMR), FOR PROVIDER SLIDE REVIEW

## 2024-04-27 LAB — LACTATE DEHYDROGENASE: LDH: 199 U/L — ABNORMAL HIGH (ref 98–192)

## 2024-04-27 MED ORDER — PREDNISONE 10 MG PO TABS: 10.0000 mg | ORAL_TABLET | Freq: Every day | ORAL | 4 refills | Status: AC

## 2024-04-27 MED ORDER — SODIUM CHLORIDE 0.9 % IV SOLN: Freq: Once | INTRAVENOUS | Status: AC

## 2024-04-27 MED ORDER — SODIUM CHLORIDE 0.9 % IV SOLN
1200.0000 mg | Freq: Once | INTRAVENOUS | Status: AC
  Administered 2024-04-27: 1200 mg via INTRAVENOUS
  Filled 2024-04-27: qty 20

## 2024-04-27 MED ORDER — MEGESTROL ACETATE 400 MG/10ML PO SUSP: 400.0000 mg | Freq: Two times a day (BID) | ORAL | 4 refills | Status: AC

## 2024-04-27 NOTE — Patient Instructions (Signed)
 [facility] - A DEPT OF [facility]  Discharge Instructions: Thank you for choosing [facility] to provide your oncology and hematology care.   If you have a lab appointment with the Cancer Center, please go directly to the Cancer Center and check in at the registration area.  Wear comfortable clothing and clothing appropriate for easy access to any Portacath or PICC line.   We strive to give you quality time with your provider. You may need to reschedule your appointment if you arrive late (15 or more minutes).  Arriving late affects you and other patients whose appointments are after yours.  Also, if you miss three or more appointments without notifying the office, you may be dismissed from the clinic at the provider's discretion.      For prescription refill requests, have your pharmacy contact our office and allow 72 hours for refills to be completed.    Today you received the following chemotherapy and/or immunotherapy agents Tecentriq      To help prevent nausea and vomiting after your treatment, we encourage you to take your nausea medication as directed.  BELOW ARE SYMPTOMS THAT SHOULD BE REPORTED IMMEDIATELY: *FEVER GREATER THAN 100.4 F (38 C) OR HIGHER *CHILLS OR SWEATING *NAUSEA AND VOMITING THAT IS NOT CONTROLLED WITH YOUR NAUSEA MEDICATION *UNUSUAL SHORTNESS OF BREATH *UNUSUAL BRUISING OR BLEEDING *URINARY PROBLEMS (pain or burning when urinating, or frequent urination) *BOWEL PROBLEMS (unusual diarrhea, constipation, pain near the anus) TENDERNESS IN MOUTH AND THROAT WITH OR WITHOUT PRESENCE OF ULCERS (sore throat, sores in mouth, or a toothache) UNUSUAL RASH, SWELLING OR PAIN  UNUSUAL VAGINAL DISCHARGE OR ITCHING   Items with * indicate a potential emergency and should be followed up as soon as possible or go to the Emergency Department if any problems should occur.  Please show the CHEMOTHERAPY ALERT CARD or IMMUNOTHERAPY  ALERT CARD at check-in to the Emergency Department and triage nurse. Should you have questions after your visit or need to cancel or reschedule your appointment, please contact [facility] - A DEPT OF [facility]  [phone] and follow the prompts.  Office hours are 8:00 a.m. to 4:30 p.m. Monday - Friday. Please note that voicemails left after 4:00 p.m. may not be returned until the following business day.  We are closed weekends and major holidays. You have access to a nurse at all times for urgent questions. Please call the main number to the clinic [phone] and follow the prompts.  For any non-urgent questions, you may also contact your provider using MyChart. We now offer e-Visits for anyone 74 and older to request care online for non-urgent symptoms. For details visit [email or web address].   Also download the MyChart app! Go to the app store, search "MyChart", open the app, select [facility], and log in with your MyChart username and password.

## 2024-04-27 NOTE — Progress Notes (Signed)
 Hematology and Oncology Follow Up Visit  Amber Cross [id number] April 08, 1961 63 y.o. 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   Prior Therapy:            Status post radiosurgery-completed on 01/31/2022 Carboplatinum/etoposide /Tecentriq  --s/p cycle #4 -- start on 05/07/2023 CNS radiotherapy-completed  at [facility] on 02/18/2024- -- 3000 Gy   Current Therapy: Tecentriq  1200 mg IV q 3 weeks - maintenance -- start on 09/23/2023  Aranesp  300 mcg subcu every 3 weeks for hemoglobin less than 11              Interim History:  Amber Cross is here today for follow-up and treatment.  Unfortunately, she is still losing some weight.  We cannot get her the Marinol .  This is on backorder.  We will try her on Megace  elixir.  I will also draw her on some prednisone .  Will try prednisone  at 10 mg daily.    She did see the radiation therapist who did her brain radiation.  Everything looked pretty good from her point of view.  I am not sure when she is can have another MRI of the brain.  She is still smoking half pack a day cigarettes.  I really hate that she is doing this.  She has had no cough.  She has had no shortness of breath.  There has been no bleeding.  She has had no chest wall pain.  There has been no leg swelling.  She has had pain over her left tibia.  There has been no swelling.  Overall, I would have to say that her performance status is probably ECOG 2.  Medications:  Allergies as of 04/27/2024       Reactions   Penicillins Itching, Other (See Comments)   redness   Strawberry (diagnostic) Itching, Swelling   Tomato Itching, Swelling        Medication List        Accurate as of April 27, 2024  9:43 AM. If you have any questions, ask your nurse or doctor.          Adjustable Lancing Device Misc   amlodipine -olmesartan  10-20 MG  tablet Commonly known as: AZOR  Take 1 tablet by mouth at bedtime.   atorvastatin  80 MG tablet Commonly known as: LIPITOR  Take 1 tablet (80 mg total) by mouth daily.   Calcium  Acetate 668 (169 Ca) MG Tabs Take 1 tablet by mouth 3 (three) times daily.   cephALEXin  250 MG capsule Commonly known as: KEFLEX  Take 1 capsule (250 mg total) by mouth daily.   dronabinol  10 MG capsule Commonly known as: MARINOL  Take 1 capsule (10 mg total) by mouth daily before lunch.   fluticasone  50 MCG/ACT nasal spray Commonly known as: FLONASE  SPRAY 2 SPRAYS INTO EACH NOSTRIL EVERY DAY   furosemide  40 MG tablet Commonly known as: Lasix  Take 1 tablet (40 mg total) by mouth daily as needed (swelling in legs or weight gain).   gabapentin  100 MG capsule Commonly known as: NEURONTIN  Take 1 capsule (100 mg total) by mouth daily.   HECTOROL  IV Take 3 mcg by mouth 3 (three) times a week.   hydrALAZINE  100 MG tablet Commonly known as: APRESOLINE  Take 1 tablet (100 mg total) by mouth 2 (two) times daily.   OLANZapine  10 MG tablet Commonly known as: ZYPREXA  Take 1 tablet (10 mg total) by mouth at bedtime.  ondansetron  8 MG tablet Commonly known as: ZOFRAN  Take 1 tablet (8 mg total) by mouth every 8 (eight) hours as needed for nausea or vomiting.   pantoprazole  40 MG tablet Commonly known as: PROTONIX  Take 40 mg by mouth daily.   prednisoLONE acetate 1 % ophthalmic suspension Commonly known as: PRED FORTE Place 1 drop into the left eye 4 (four) times daily.   predniSONE  20 MG tablet Commonly known as: DELTASONE  Take 1 tablet (20 mg total) by mouth daily with breakfast.   sevelamer  carbonate 800 MG tablet Commonly known as: RENVELA  Take 800 mg by mouth 3 (three) times daily with meals.   traZODone  50 MG tablet Commonly known as: DESYREL  TAKE 1 TABLET BY MOUTH EVERYDAY AT BEDTIME        Allergies:  Allergies  Allergen Reactions   Penicillins Itching and Other (See Comments)     redness   Strawberry (Diagnostic) Itching and Swelling   Tomato Itching and Swelling    Past Medical History, Surgical history, Social history, and Family History were reviewed and updated.  Review of Systems: Review of Systems  Constitutional:  Positive for malaise/fatigue and weight loss.  HENT: Negative.    Eyes: Negative.   Respiratory: Negative.    Cardiovascular:  Positive for palpitations.  Gastrointestinal:  Positive for nausea.  Genitourinary: Negative.   Musculoskeletal: Negative.   Skin: Negative.   Neurological: Negative.   Endo/Heme/Allergies: Negative.   Psychiatric/Behavioral:  The patient is nervous/anxious.      Physical Exam:  height is 5' 7 (1.702 m) and weight is 112 lb (50.8 kg). Her oral temperature is 98.6 F (37 C). Her blood pressure is 147/62 (abnormal) and her pulse is 80. Her respiration is 18 and oxygen saturation is 98%.   Wt Readings from Last 3 Encounters:  04/27/24 112 lb (50.8 kg)  04/06/24 114 lb 6.4 oz (51.9 kg)  03/17/24 115 lb 9.6 oz (52.4 kg)    Physical Exam Vitals reviewed.  HENT:     Head: Normocephalic and atraumatic.  Eyes:     Pupils: Pupils are equal, round, and reactive to light.  Cardiovascular:     Rate and Rhythm: Normal rate and regular rhythm.     Heart sounds: Normal heart sounds.  Pulmonary:     Effort: Pulmonary effort is normal.     Breath sounds: Normal breath sounds.  Abdominal:     General: Bowel sounds are normal.     Palpations: Abdomen is soft.  Musculoskeletal:        General: No tenderness or deformity. Normal range of motion.     Cervical back: Normal range of motion.  Lymphadenopathy:     Cervical: No cervical adenopathy.  Skin:    General: Skin is warm and dry.     Findings: No erythema or rash.  Neurological:     Mental Status: She is alert and oriented to person, place, and time.  Psychiatric:        Behavior: Behavior normal.        Thought Content: Thought content normal.         Judgment: Judgment normal.       Lab Results  Component Value Date   WBC 4.7 04/27/2024   HGB 11.9 (L) 04/27/2024   HCT 37.8 04/27/2024   MCV 98.2 04/27/2024   PLT 168 04/27/2024   Lab Results  Component Value Date   FERRITIN 1,212 (H) 11/25/2023   IRON 119 11/25/2023   TIBC 281 11/25/2023  UIBC 162 11/25/2023   IRONPCTSAT 42 (H) 11/25/2023   Lab Results  Component Value Date   RETICCTPCT 1.9 01/08/2023   RBC 3.85 (L) 04/27/2024   Lab Results  Component Value Date   KPAFRELGTCHN 123.1 (H) 01/31/2022   LAMBDASER 36.3 (H) 01/31/2022   KAPLAMBRATIO 3.39 (H) 01/31/2022   Lab Results  Component Value Date   IGGSERUM 768 05/09/2021   IGA 150 05/09/2021   IGMSERUM 60 05/09/2021   Lab Results  Component Value Date   TOTALPROTELP 5.3 (L) 01/31/2022   ALBUMINELP 1.8 (L) 01/31/2022   A1GS 0.4 01/31/2022   A2GS 1.3 (H) 01/31/2022   BETS 1.0 01/31/2022   GAMS 0.8 01/31/2022   MSPIKE Not Observed 01/31/2022   SPEI Comment 01/31/2022     Chemistry      Component Value Date/Time   NA 141 04/27/2024 0904   NA 141 03/11/2023 1220   K 3.7 04/27/2024 0904   CL 97 (L) 04/27/2024 0904   CO2 30 04/27/2024 0904   BUN 15 04/27/2024 0904   BUN 26 03/11/2023 1220   CREATININE 4.98 (H) 04/27/2024 0904      Component Value Date/Time   CALCIUM  9.2 04/27/2024 0904   ALKPHOS 67 04/27/2024 0904   AST 15 04/27/2024 0904   ALT 6 04/27/2024 0904   BILITOT 0.4 04/27/2024 0904       Impression and Plan: Ms. Igarashi is a very nice 63 yo African American female with limited stage small cell lung cancer in addition to ESRD.   She had CNS radiotherapy in July and repeat MRI showed no evidence of progressive metastatic disease.  Again, I think Radiation Oncology is managing this.  I will try to help with her weight loss.  Hopefully, the Megace  will help her.  She will take 10 cc p.o. twice daily.  She also will take prednisone  10 mg p.o. daily.  I do not think that the prednisone   will affect the Tecentriq .  She will also get Aranesp  today.  At some point, we will have to repeat her PET scan.  We probably will have to do this I think in November.   Maude JONELLE Crease, MD 9/30/20259:43 AM

## 2024-04-28 ENCOUNTER — Other Ambulatory Visit: Payer: Self-pay | Admitting: Family

## 2024-04-28 DIAGNOSIS — C3411 Malignant neoplasm of upper lobe, right bronchus or lung: Secondary | ICD-10-CM

## 2024-04-28 DIAGNOSIS — R112 Nausea with vomiting, unspecified: Secondary | ICD-10-CM

## 2024-05-18 ENCOUNTER — Inpatient Hospital Stay

## 2024-05-18 ENCOUNTER — Inpatient Hospital Stay: Admitting: Family

## 2024-05-18 NOTE — Progress Notes (Shared)
 [facility] - Clinic Note  05/26/2024     CHIEF COMPLAINT Patient presents for No chief complaint on file.   HISTORY OF PRESENT ILLNESS: Amber Cross is a 63 y.o. female who presents to the clinic today for:    Pt states she sees the black spots when she gets close to coming into the office for a shot.  Referring physician: Clem Brands, PA-C  [facility] [address] [place],  [place] [address]  HISTORICAL INFORMATION:  Selected notes from the MEDICAL RECORD NUMBER Referred by Clem Brands, PA-C for concern of CME / PDR LEE:  Ocular Hx- PMH-   CURRENT MEDICATIONS: Current Outpatient Medications (Ophthalmic Drugs)  Medication Sig   prednisoLONE acetate (PRED FORTE) 1 % ophthalmic suspension Place 1 drop into the left eye 4 (four) times daily. (Patient not taking: Reported on 04/21/2024)   No current facility-administered medications for this visit. (Ophthalmic Drugs)   Current Outpatient Medications (Other)  Medication Sig   amlodipine -olmesartan  (AZOR ) 10-20 MG tablet Take 1 tablet by mouth at bedtime.   atorvastatin  (LIPITOR ) 80 MG tablet Take 1 tablet (80 mg total) by mouth daily.   Calcium  Acetate 668 (169 Ca) MG TABS Take 1 tablet by mouth 3 (three) times daily.   cephALEXin  (KEFLEX ) 250 MG capsule Take 1 capsule (250 mg total) by mouth daily. (Patient not taking: Reported on 04/21/2024)   Doxercalciferol  (HECTOROL  IV) Take 3 mcg by mouth 3 (three) times a week.   dronabinol  (MARINOL ) 10 MG capsule Take 1 capsule (10 mg total) by mouth daily before lunch. (Patient not taking: Reported on 04/21/2024)   fluticasone  (FLONASE ) 50 MCG/ACT nasal spray SPRAY 2 SPRAYS INTO EACH NOSTRIL EVERY DAY   furosemide  (LASIX ) 40 MG tablet Take 1 tablet (40 mg total) by mouth daily as needed (swelling in legs or weight gain). (Patient not taking: Reported on 04/21/2024)   gabapentin  (NEURONTIN ) 100 MG capsule Take 1 capsule (100 mg total)  by mouth daily.   hydrALAZINE  (APRESOLINE ) 100 MG tablet Take 1 tablet (100 mg total) by mouth 2 (two) times daily.   Lancet Devices (ADJUSTABLE LANCING DEVICE) MISC    megestrol  (MEGACE ) 400 MG/10ML suspension Take 10 mLs (400 mg total) by mouth 2 (two) times daily.   OLANZapine  (ZYPREXA ) 10 MG tablet TAKE 1 TABLET BY MOUTH EVERYDAY AT BEDTIME   ondansetron  (ZOFRAN ) 8 MG tablet Take 1 tablet (8 mg total) by mouth every 8 (eight) hours as needed for nausea or vomiting.   pantoprazole  (PROTONIX ) 40 MG tablet Take 40 mg by mouth daily. (Patient not taking: Reported on 04/21/2024)   predniSONE  (DELTASONE ) 10 MG tablet Take 1 tablet (10 mg total) by mouth daily with breakfast.   sevelamer  carbonate (RENVELA ) 800 MG tablet Take 800 mg by mouth 3 (three) times daily with meals.   traZODone  (DESYREL ) 50 MG tablet TAKE 1 TABLET BY MOUTH EVERYDAY AT BEDTIME   No current facility-administered medications for this visit. (Other)   REVIEW OF SYSTEMS:   ALLERGIES Allergies  Allergen Reactions   Penicillins Itching and Other (See Comments)    redness   Strawberry (Diagnostic) Itching and Swelling   Tomato Itching and Swelling   PAST MEDICAL HISTORY Past Medical History:  Diagnosis Date   Anemia    Anemia of chronic renal failure, stage 4 (severe) (HCC) 07/08/2023   Arthritis    Chest pain 05/07/2021   CHF (congestive heart failure) (HCC)    Chronic kidney disease  dialysis M-W-F   Depression    Diabetes mellitus    Headache    Hypercholesteremia    Hypertension    Hypertensive emergency 06/16/2022   Lung cancer (HCC) 12/18/2021   RUL small cell carcinoma, s/p radiation   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   Postop check 08/05/2022   Pseudogout    Stroke (HCC) 10/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   Tamponade 06/18/2022   Tobacco abuse    Past Surgical History:  Procedure Laterality Date   ANTERIOR CRUCIATE LIGAMENT REPAIR Right    AV FISTULA PLACEMENT Left 07/01/2022   Procedure: LEFT  ARTERIOVENOUS (AV) FISTULA CREATION;  Surgeon: Eliza Lonni RAMAN, MD;  Location: [facility] OR;  Service: Vascular;  Laterality: Left;  with regional block   BRONCHIAL BIOPSY  12/18/2021   Procedure: BRONCHIAL BIOPSIES;  Surgeon: Brenna Adine CROME, DO;  Location: [facility] ENDOSCOPY;  Service: Pulmonary;;   BRONCHIAL BRUSHINGS  12/18/2021   Procedure: BRONCHIAL BRUSHINGS;  Surgeon: Brenna Adine CROME, DO;  Location: [facility] ENDOSCOPY;  Service: Pulmonary;;   BRONCHIAL NEEDLE ASPIRATION BIOPSY  12/18/2021   Procedure: BRONCHIAL NEEDLE ASPIRATION BIOPSIES;  Surgeon: Brenna Adine CROME, DO;  Location: [facility] ENDOSCOPY;  Service: Pulmonary;;   CATARACT EXTRACTION     CHEST TUBE INSERTION Right 06/18/2022   Procedure: CHEST TUBE INSERTION;  Surgeon: Obadiah Coy, MD;  Location: [facility] OR;  Service: Thoracic;  Laterality: Right;   EXCHANGE OF A DIALYSIS CATHETER N/A 08/08/2022   Procedure: EXCHANGE OF A TUNNELED DIALYSIS CATHETER;  Surgeon: Serene Gaile ORN, MD;  Location: [facility] OR;  Service: Vascular;  Laterality: N/A;   FIDUCIAL MARKER PLACEMENT  12/18/2021   Procedure: FIDUCIAL MARKER PLACEMENT;  Surgeon: Brenna Adine CROME, DO;  Location: [facility];  Service: Pulmonary;;   IR FLUORO GUIDE CV LINE RIGHT  06/26/2022   IR US  GUIDE VASC ACCESS RIGHT  06/26/2022   OVARY SURGERY     RIGHT OOPHORECTOMY Right    SMALL INTESTINE SURGERY     SUBXYPHOID PERICARDIAL WINDOW N/A 06/18/2022   Procedure: SUBXYPHOID PERICARDIAL WINDOW;  Surgeon: Obadiah Coy, MD;  Location: [facility] OR;  Service: Thoracic;  Laterality: N/A;   TEE WITHOUT CARDIOVERSION N/A 06/18/2022   Procedure: TRANSESOPHAGEAL ECHOCARDIOGRAM (TEE);  Surgeon: Obadiah Coy, MD;  Location[facility] OR;  Service: Thoracic;  Laterality: N/A;   TUBAL LIGATION     VIDEO BRONCHOSCOPY WITH RADIAL ENDOBRONCHIAL ULTRASOUND  12/18/2021   Procedure: RADIAL ENDOBRONCHIAL ULTRASOUND;  Surgeon: Brenna Adine CROME, DO;  Location: [facility];  Service: Pulmonary;;   FAMILY HISTORY Family History   Problem Relation Age of Onset   Diabetes Mother    Hypertension Mother    Hypertension Father    SOCIAL HISTORY Social History   Tobacco Use   Smoking status: Every Day    Current packs/day: 0.50    Average packs/day: 0.5 packs/day for 40.0 years (20.0 ttl pk-yrs)    Types: Cigarettes   Smokeless tobacco: Never   Tobacco comments:    Cutting back 1/4-1/2 ppd  Vaping Use   Vaping status: Never Used  Substance Use Topics   Alcohol use: Not Currently   Drug use: Yes    Frequency: 4.0 times per week    Types: Marijuana    Comment: smokes every other day      OPHTHALMIC EXAM:  Not recorded    IMAGING AND PROCEDURES  Imaging and Procedures for 05/26/2024          ASSESSMENT/PLAN: No diagnosis found.   1,2 Severe Non-proliferative diabetic  retinopathy, both eyes  - A1C 7.8 (08.13.24), 10.4 (05.09.24), 6.7 (11.30.23) - s/p IVA OD #1 (05.02.24), #2 (05.30.24), #3 (07.24.24), #4 (08.21.24), #5 (10.08.24), #6 (11.05.24), #7 (12.10.24), #8 (01.14.25), #9 (02.18.25), #10 (03.25.25), #11 (04.29.25), #12 (06.03.25) - s/p IVA OS #1 (04.04.24), #2 (05.02.24), #3 (05.30.24), #4 (07.24.24), #5 (08.21.24) -- IVA resistance OS ====================== - s/p IVE OD #1 (07.23.25), #2 (08.19.25), #3 (09.24.25) - s/p IVE OS #1 (10.08.24), #2 (11.05.24), #3 (12.10.24), #4 (01.14.25), #5 (02.18.25), #6 (03.25.25), #7 (04.29.25), #8 (06.03.25), #9 (07.23.25), #10 (08.19.25), #11 (09.24.25) - FA (04.04.24) shows leaking MA greatest posteriorly, no NV OU - BCVA OD 20/30 from 20/30, OS 20/30 - stable - exam shows scattered MA/DBH and exudates OU (OS > OD) - OCT shows OD: Interval improvement IRF/cystic changes greatest superior mac; OS: Mild interval improvement in IRF/IRHM greatest IT macula at 5 weeks - recommend IVE OD #4 and IVE OS #12 today, 10.29.25 for DME w/ f/u in 5 wks again - pt wishes to proceed - RBA of procedure discussed, questions answered - Eylea  approved with insurance -  IVA informed consent obtained and signed, 04.04.24 (OU) - IVE informed consent obtained and signed, 10.08.24 (OU) - see procedure note - f/u in 5 wks -- DFE/OCT, possible injection  3,4. Hypertensive retinopathy OU - discussed importance of tight BP control - monitor - pt reports baseline BP 160's/60's  5. Pseudophakia OU  - s/p CE/IOL OU (Dr. Octavia, OD: 2017, OS: 03.04.25)  - IOL in good position, doing well  - monitor  Ophthalmic Meds Ordered this visit:  No orders of the defined types were placed in this encounter.   No follow-ups on file.  There are no Patient Instructions on file for this visit.  Explained the diagnoses, plan, and follow up with the patient and they expressed understanding.  Patient expressed understanding of the importance of proper follow up care.    This document serves as a record of services personally performed by Redell JUDITHANN Hans, MD, PhD. It was created on their behalf by Almetta Pesa, an ophthalmic technician. The creation of this record is the provider's dictation and/or activities during the visit.    Electronically signed by: Almetta Pesa, OA, 05/18/24  12:08 PM   Redell JUDITHANN Hans, M.D., Ph.D. Diseases & Surgery of the Retina and Vitreous [facility]   Abbreviations: M myopia (nearsighted); A astigmatism; H hyperopia (farsighted); P presbyopia; Mrx spectacle prescription;  CTL contact lenses; OD right eye; OS left eye; OU both eyes  XT exotropia; ET esotropia; PEK punctate epithelial keratitis; PEE punctate epithelial erosions; DES dry eye syndrome; MGD meibomian gland dysfunction; ATs artificial tears; PFAT's preservative free artificial tears; NSC nuclear sclerotic cataract; PSC posterior subcapsular cataract; ERM epi-retinal membrane; PVD posterior vitreous detachment; RD retinal detachment; DM diabetes mellitus; DR diabetic retinopathy; NPDR non-proliferative diabetic retinopathy; PDR proliferative diabetic  retinopathy; CSME clinically significant macular edema; DME diabetic macular edema; dbh dot blot hemorrhages; CWS cotton wool spot; POAG primary open angle glaucoma; C/D cup-to-disc ratio; HVF humphrey visual field; GVF goldmann visual field; OCT optical coherence tomography; IOP intraocular pressure; BRVO Branch retinal vein occlusion; CRVO central retinal vein occlusion; CRAO central retinal artery occlusion; BRAO branch retinal artery occlusion; RT retinal tear; SB scleral buckle; PPV pars plana vitrectomy; VH Vitreous hemorrhage; PRP panretinal laser photocoagulation; IVK intravitreal kenalog; VMT vitreomacular traction; MH Macular hole;  NVD neovascularization of the disc; NVE neovascularization elsewhere; AREDS age related eye disease study; ARMD age related  macular degeneration; POAG primary open angle glaucoma; EBMD epithelial/anterior basement membrane dystrophy; ACIOL anterior chamber intraocular lens; IOL intraocular lens; PCIOL posterior chamber intraocular lens; Phaco/IOL phacoemulsification with intraocular lens placement; PRK photorefractive keratectomy; LASIK laser assisted in situ keratomileusis; HTN hypertension; DM diabetes mellitus; COPD chronic obstructive pulmonary disease

## 2024-05-19 ENCOUNTER — Other Ambulatory Visit: Payer: Self-pay

## 2024-05-19 DIAGNOSIS — E1140 Type 2 diabetes mellitus with diabetic neuropathy, unspecified: Secondary | ICD-10-CM

## 2024-05-19 MED ORDER — GABAPENTIN 100 MG PO CAPS: 100.0000 mg | ORAL_CAPSULE | Freq: Every day | ORAL | 0 refills | Status: AC

## 2024-05-24 ENCOUNTER — Ambulatory Visit: Payer: Self-pay

## 2024-05-24 NOTE — Telephone Encounter (Signed)
 Appt scheduled

## 2024-05-24 NOTE — Telephone Encounter (Signed)
 FYI Only or Action Required?: FYI only for provider.  Patient was last seen in primary care on 03/17/2024 by Cyndi Shaver, PA-C.  Called Nurse Triage reporting Ear Fullness.  Symptoms began several weeks ago.  Interventions attempted: OTC medications: antihistamine.  Symptoms are: gradually worsening.  Triage Disposition: See PCP When Office is Open (Within 3 Days)  Patient/caregiver understands and will follow disposition?: Yes   Copied from CRM #[id number]. Topic: Clinical - Red Word Triage >> May 24, 2024 11:04 AM Pinkey ORN wrote: Red Word that prompted transfer to Nurse Triage: Possible Swelling >> May 24, 2024 11:05 AM Pinkey ORN wrote: Patient states she's experiencing possible swelling in her ears. Patient mentions that it also feels as if there's water in them as well as experiencing this continuous cough.  Reason for Disposition  [1] Ear congestion lasts > 3 days AND [2] no improvement after using Care Advice  (Exception: Ear congestion is a chronic symptom.)  Answer Assessment - Initial Assessment Questions Patient states her old PCP left and did not order her home supplies such as walker, shower chair, commode.   1. LOCATION: Which ear is involved?       Both  2. SENSATION: Describe how the ear feels. (e.g., stuffy, full, plugged).      Hearing is decreased, and she states she hears water in her ears 3. ONSET:  When did the ear symptoms start?       A few weeks ago 4. PAIN: Do you also have an earache? If Yes, ask: How bad is it? (Scale 0-10; none, mild, moderate or severe)     No 5. CAUSE: What do you think is causing the ear congestion? (e.g., common cold, nasal allergies, recent flight, recent snorkeling)     Patient also has cough and nasal congestion/runny nose 6. OTHER SYMPTOMS: Do you have any other symptoms? (e.g., ear drainage, hay fever symptoms such as sneezing or a clear nasal discharge; cold symptoms such as a cough or runny nose)      Patient states she can hear water like she is a swimming pool  Protocols used: Ear - Congestion-A-AH

## 2024-05-25 ENCOUNTER — Inpatient Hospital Stay: Admitting: Family

## 2024-05-25 ENCOUNTER — Inpatient Hospital Stay: Attending: Radiation Oncology

## 2024-05-25 ENCOUNTER — Inpatient Hospital Stay

## 2024-05-25 ENCOUNTER — Encounter: Payer: Self-pay | Admitting: Family Medicine

## 2024-05-25 ENCOUNTER — Ambulatory Visit: Admitting: Family Medicine

## 2024-05-25 ENCOUNTER — Telehealth: Payer: Self-pay | Admitting: Neurology

## 2024-05-25 ENCOUNTER — Ambulatory Visit: Payer: Self-pay | Admitting: Hematology & Oncology

## 2024-05-25 VITALS — BP 125/50 | HR 100 | Temp 98.8°F | Wt 123.0 lb

## 2024-05-25 VITALS — BP 125/50 | HR 100 | Temp 98.8°F | Ht 67.0 in | Wt 123.0 lb

## 2024-05-25 DIAGNOSIS — J988 Other specified respiratory disorders: Secondary | ICD-10-CM | POA: Diagnosis not present

## 2024-05-25 DIAGNOSIS — H6123 Impacted cerumen, bilateral: Secondary | ICD-10-CM | POA: Diagnosis not present

## 2024-05-25 DIAGNOSIS — C3411 Malignant neoplasm of upper lobe, right bronchus or lung: Secondary | ICD-10-CM

## 2024-05-25 DIAGNOSIS — D631 Anemia in chronic kidney disease: Secondary | ICD-10-CM | POA: Insufficient documentation

## 2024-05-25 DIAGNOSIS — N186 End stage renal disease: Secondary | ICD-10-CM | POA: Insufficient documentation

## 2024-05-25 LAB — IRON AND IRON BINDING CAPACITY (CC-WL,HP ONLY)
Iron: 47 ug/dL (ref 28–170)
Saturation Ratios: 20 % (ref 10.4–31.8)
TIBC: 239 ug/dL — ABNORMAL LOW (ref 250–450)
UIBC: 192 ug/dL

## 2024-05-25 LAB — CBC WITH DIFFERENTIAL (CANCER CENTER ONLY)
Abs Immature Granulocytes: 0.03 K/uL (ref 0.00–0.07)
Basophils Absolute: 0 K/uL (ref 0.0–0.1)
Basophils Relative: 0 %
Eosinophils Absolute: 0 K/uL (ref 0.0–0.5)
Eosinophils Relative: 0 %
HCT: 28.9 % — ABNORMAL LOW (ref 36.0–46.0)
Hemoglobin: 9.2 g/dL — ABNORMAL LOW (ref 12.0–15.0)
Immature Granulocytes: 0 %
Lymphocytes Relative: 7 %
Lymphs Abs: 0.5 K/uL — ABNORMAL LOW (ref 0.7–4.0)
MCH: 31 pg (ref 26.0–34.0)
MCHC: 31.8 g/dL (ref 30.0–36.0)
MCV: 97.3 fL (ref 80.0–100.0)
Monocytes Absolute: 0.4 K/uL (ref 0.1–1.0)
Monocytes Relative: 6 %
Neutro Abs: 6.3 K/uL (ref 1.7–7.7)
Neutrophils Relative %: 87 %
Platelet Count: 167 K/uL (ref 150–400)
RBC: 2.97 MIL/uL — ABNORMAL LOW (ref 3.87–5.11)
RDW: 16 % — ABNORMAL HIGH (ref 11.5–15.5)
WBC Count: 7.4 K/uL (ref 4.0–10.5)
nRBC: 0 % (ref 0.0–0.2)

## 2024-05-25 LAB — CMP (CANCER CENTER ONLY)
ALT: 8 U/L (ref 0–44)
AST: 17 U/L (ref 15–41)
Albumin: 4 g/dL (ref 3.5–5.0)
Alkaline Phosphatase: 53 U/L (ref 38–126)
Anion gap: 16 — ABNORMAL HIGH (ref 5–15)
BUN: 45 mg/dL — ABNORMAL HIGH (ref 8–23)
CO2: 26 mmol/L (ref 22–32)
Calcium: 9.3 mg/dL (ref 8.9–10.3)
Chloride: 98 mmol/L (ref 98–111)
Creatinine: 5.99 mg/dL — ABNORMAL HIGH (ref 0.44–1.00)
GFR, Estimated: 7 mL/min — ABNORMAL LOW (ref >60)
Glucose, Bld: 142 mg/dL — ABNORMAL HIGH (ref 70–99)
Potassium: 4.4 mmol/L (ref 3.5–5.1)
Sodium: 140 mmol/L (ref 135–145)
Total Bilirubin: 0.3 mg/dL (ref 0.0–1.2)
Total Protein: 6.8 g/dL (ref 6.5–8.1)

## 2024-05-25 LAB — LACTATE DEHYDROGENASE: LDH: 260 U/L — ABNORMAL HIGH (ref 98–192)

## 2024-05-25 LAB — FERRITIN: Ferritin: 1263 ng/mL — ABNORMAL HIGH (ref 11–307)

## 2024-05-25 MED ORDER — DOXYCYCLINE HYCLATE 100 MG PO TABS: 100.0000 mg | ORAL_TABLET | Freq: Two times a day (BID) | ORAL | 0 refills | Status: AC

## 2024-05-25 MED ORDER — DARBEPOETIN ALFA 300 MCG/0.6ML IJ SOSY: 300.0000 ug | PREFILLED_SYRINGE | Freq: Once | INTRAMUSCULAR | Status: DC

## 2024-05-25 MED ORDER — DARBEPOETIN ALFA 300 MCG/0.6ML IJ SOSY
300.0000 ug | PREFILLED_SYRINGE | Freq: Once | INTRAMUSCULAR | Status: AC
  Administered 2024-05-25: 300 ug via SUBCUTANEOUS

## 2024-05-25 NOTE — Progress Notes (Signed)
 Hematology and Oncology Follow Up Visit  Amber Cross [id number] 15-Aug-1960 63 y.o. 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   Prior Therapy:            Status post radiosurgery-completed on 01/31/2022 Carboplatinum/etoposide /Tecentriq  --s/p cycle #4 -- start on 05/07/2023 CNS radiotherapy-completed  at [facility] on 02/18/2024- -- 3000 Gy   Current Therapy: Tecentriq  1200 mg IV q 3 weeks - maintenance -- start on 09/23/2023  Aranesp  300 mcg subcu every 3 weeks for hemoglobin less than 11   Interim History:  Amber Cross is here today for follow-up and treatment. She has had a head cold with sinus and ear congestion and cough for over a week now. She missed her appointment with us  last week due to these symptoms.  She was able to see her PCP this morning and they are staring her on a course of Doxycycline as well as and over the counter ear drop to help clear the ear cerumen impaction.  Since leaving the PCP office she had an episode of diarrhea which is new.  Lung sounds are clear at this time. No fever, chills, n/v, rash, dizziness, chest pain, palpitations, abdominal pain or changes in bladder habits.  No swelling noted.  Appetite and hydration are improved with megace . Weight is now 123 lbs (previously 112 lbs).  She has not noted any blood loss. No abnormal bruising, no petechiae.    ECOG Performance Status: 2 - Symptomatic, <50% confined to bed  Medications:  Allergies as of 05/25/2024       Reactions   Penicillins Itching, Other (See Comments)   redness   Strawberry (diagnostic) Itching, Swelling   Tomato Itching, Swelling        Medication List        Accurate as of May 25, 2024 10:17 AM. If you have any questions, ask your nurse or doctor.          STOP taking these medications    prednisoLONE acetate 1 % ophthalmic  suspension Commonly known as: PRED FORTE Stopped by: Waddell KATHEE Mon       TAKE these medications    Adjustable Lancing Device Misc   amlodipine -olmesartan  10-20 MG tablet Commonly known as: AZOR  Take 1 tablet by mouth at bedtime.   atorvastatin  80 MG tablet Commonly known as: LIPITOR  Take 1 tablet (80 mg total) by mouth daily.   Calcium  Acetate 668 (169 Ca) MG Tabs Take 1 tablet by mouth 3 (three) times daily.   cephALEXin  250 MG capsule Commonly known as: KEFLEX  Take 1 capsule (250 mg total) by mouth daily.   doxycycline 100 MG tablet Commonly known as: VIBRA-TABS Take 1 tablet (100 mg total) by mouth 2 (two) times daily for 5 days. Started by: Waddell KATHEE Mon   dronabinol  10 MG capsule Commonly known as: MARINOL  Take 1 capsule (10 mg total) by mouth daily before lunch.   fluticasone  50 MCG/ACT nasal spray Commonly known as: FLONASE  SPRAY 2 SPRAYS INTO EACH NOSTRIL EVERY DAY   furosemide  40 MG tablet Commonly known as: Lasix  Take 1 tablet (40 mg total) by mouth daily as needed (swelling in legs or weight gain).   gabapentin  100 MG capsule Commonly known as: NEURONTIN  Take 1 capsule (100 mg total) by mouth daily.   HECTOROL  IV Take 3 mcg by mouth 3 (three) times a week.   hydrALAZINE  100 MG tablet Commonly  known as: APRESOLINE  Take 1 tablet (100 mg total) by mouth 2 (two) times daily.   megestrol  400 MG/10ML suspension Commonly known as: MEGACE  Take 10 mLs (400 mg total) by mouth 2 (two) times daily.   OLANZapine  10 MG tablet Commonly known as: ZYPREXA  TAKE 1 TABLET BY MOUTH EVERYDAY AT BEDTIME   ondansetron  8 MG tablet Commonly known as: ZOFRAN  Take 1 tablet (8 mg total) by mouth every 8 (eight) hours as needed for nausea or vomiting.   pantoprazole  40 MG tablet Commonly known as: PROTONIX  Take 40 mg by mouth daily.   predniSONE  10 MG tablet Commonly known as: DELTASONE  Take 1 tablet (10 mg total) by mouth daily with breakfast.   sevelamer   carbonate 800 MG tablet Commonly known as: RENVELA  Take 800 mg by mouth 3 (three) times daily with meals.   traZODone  50 MG tablet Commonly known as: DESYREL  TAKE 1 TABLET BY MOUTH EVERYDAY AT BEDTIME        Allergies:  Allergies  Allergen Reactions   Penicillins Itching and Other (See Comments)    redness   Strawberry (Diagnostic) Itching and Swelling   Tomato Itching and Swelling    Past Medical History, Surgical history, Social history, and Family History were reviewed and updated.  Review of Systems: All other 10 point review of systems is negative.   Physical Exam:  weight is 123 lb (55.8 kg). Her oral temperature is 98.8 F (37.1 C). Her blood pressure is 125/50 (abnormal) and her pulse is 100. Her oxygen saturation is 97%.   Wt Readings from Last 3 Encounters:  05/25/24 123 lb (55.8 kg)  05/25/24 123 lb (55.8 kg)  04/27/24 112 lb (50.8 kg)    Ocular: Sclerae unicteric, pupils equal, round and reactive to light Ear-nose-throat: Oropharynx clear, dentition fair Lymphatic: No cervical or supraclavicular adenopathy Lungs no rales or rhonchi, good excursion bilaterally Heart regular rate and rhythm, no murmur appreciated Abd soft, nontender, positive bowel sounds MSK no focal spinal tenderness, no joint edema Neuro: non-focal, well-oriented, appropriate affect Breasts: Deferred   Lab Results  Component Value Date   WBC 4.7 04/27/2024   HGB 11.9 (L) 04/27/2024   HCT 37.8 04/27/2024   MCV 98.2 04/27/2024   PLT 168 04/27/2024   Lab Results  Component Value Date   FERRITIN 1,212 (H) 11/25/2023   IRON 119 11/25/2023   TIBC 281 11/25/2023   UIBC 162 11/25/2023   IRONPCTSAT 42 (H) 11/25/2023   Lab Results  Component Value Date   RETICCTPCT 1.9 01/08/2023   RBC 3.85 (L) 04/27/2024   Lab Results  Component Value Date   KPAFRELGTCHN 123.1 (H) 01/31/2022   LAMBDASER 36.3 (H) 01/31/2022   KAPLAMBRATIO 3.39 (H) 01/31/2022   Lab Results  Component Value  Date   IGGSERUM 768 05/09/2021   IGA 150 05/09/2021   IGMSERUM 60 05/09/2021   Lab Results  Component Value Date   TOTALPROTELP 5.3 (L) 01/31/2022   ALBUMINELP 1.8 (L) 01/31/2022   A1GS 0.4 01/31/2022   A2GS 1.3 (H) 01/31/2022   BETS 1.0 01/31/2022   GAMS 0.8 01/31/2022   MSPIKE Not Observed 01/31/2022   SPEI Comment 01/31/2022     Chemistry      Component Value Date/Time   NA 141 04/27/2024 0904   NA 141 03/11/2023 1220   K 3.7 04/27/2024 0904   CL 97 (L) 04/27/2024 0904   CO2 30 04/27/2024 0904   BUN 15 04/27/2024 0904   BUN 26 03/11/2023 1220   CREATININE  4.98 (H) 04/27/2024 0904      Component Value Date/Time   CALCIUM  9.2 04/27/2024 0904   ALKPHOS 67 04/27/2024 0904   AST 15 04/27/2024 0904   ALT 6 04/27/2024 0904   BILITOT 0.4 04/27/2024 0904       Impression and Plan: Ms. Belli is a very nice 63 yo African American female with limited stage small cell lung cancer in addition to ESRD.  She had CNS radiotherapy in July and repeat MRI showed no evidence of progressive metastatic disease.  We will hold Tecentriq  treatment today.  Aranesp  given for Hgb 9.2.  Follow-up in 1 week.   Lauraine Pepper, NP 10/28/202510:17 AM

## 2024-05-25 NOTE — Patient Instructions (Signed)

## 2024-05-25 NOTE — Telephone Encounter (Signed)
 Copied from CRM [id number]. Topic: Clinical - Order For Equipment >> May 25, 2024 12:17 PM Amber Cross wrote: Reason for CRM: Patient states at her appointment today she was to be giving a letter to go get a shower chair, portable toilet, and a stroller . Patient requests a return call concerning this.

## 2024-05-25 NOTE — Patient Instructions (Signed)
 Use Debrox drops (over the counter at pharmacy) and return in one week for repeat ear cleaning.

## 2024-05-25 NOTE — Telephone Encounter (Signed)
 Printed previous orders from [facility]. Called patient to see if she would like to pick up or have us  send to a specific location. No answer. LVM letting her know they are at the front for pick up, to let us  know if she would like them sent somewhere else.

## 2024-05-25 NOTE — Progress Notes (Signed)
 Acute Office Visit  Subjective:  Patient ID: Amber Cross, female    DOB: Apr 01, 1961  Age: 63 y.o. MRN: [id number]  CC:  Chief Complaint  Patient presents with   Cough   Ear Problem     HPI Amber Cross is here today for Ear fullness and Cough.    Discussed the use of AI scribe software for clinical note transcription with the patient, who gave verbal consent to proceed.  History of Present Illness Amber Cross is a 63 year old female who presents with ear congestion, cough, and chronic nausea.  She has experienced a sensation of water squishing around in her ears, fullness, and itching for the past two weeks. Over-the-counter severe cold and cough syrup has not provided relief. She has recently started using nasal sprays like Flonase . Additionally, she uses Q-tips to try to remove ear wax.  She reports significant congestion that she is unable to expel and a persistent cough that initially produced a small amount of blood during the first week. No sore throat, diarrhea, vomiting, or fever. She has tried over-the-counter medications like Mucinex without relief. She confirms a sensation of congestion and rattling in her chest without productive cough.  She has been experiencing chronic nausea, which she describes as a usual symptom for her (on chemo). She has not found relief with any new snacks or treatments.        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   Postop check 08/05/2022   Pseudogout    Stroke (HCC) 10/2021   in right   arm   Tamponade 06/18/2022   Tobacco abuse     Past Surgical History:  Procedure Laterality Date   ANTERIOR CRUCIATE LIGAMENT REPAIR Right    AV FISTULA PLACEMENT Left 07/01/2022   Procedure: LEFT ARTERIOVENOUS (AV) FISTULA CREATION;  Surgeon: Eliza Lonni RAMAN, MD;  Location: [facility] OR;  Service: Vascular;  Laterality: Left;  with regional block   BRONCHIAL BIOPSY  12/18/2021   Procedure: BRONCHIAL BIOPSIES;  Surgeon: Brenna Adine CROME, DO;  Location: [facility] ENDOSCOPY;  Service: Pulmonary;;   BRONCHIAL BRUSHINGS  12/18/2021   Procedure: BRONCHIAL BRUSHINGS;  Surgeon: Brenna Adine CROME, DO;  Location: [facility];  Service: Pulmonary;;   BRONCHIAL NEEDLE ASPIRATION BIOPSY  12/18/2021   Procedure: BRONCHIAL NEEDLE ASPIRATION BIOPSIES;  Surgeon: Brenna Adine CROME, DO;  Location: [facility];  Service: Pulmonary;;   CATARACT EXTRACTION     CHEST TUBE INSERTION Right 06/18/2022   Procedure: CHEST TUBE INSERTION;  Surgeon: Obadiah Coy, MD;  Location: [facility] OR;  Service: Thoracic;  Laterality: Right;   EXCHANGE OF A DIALYSIS CATHETER N/A 08/08/2022   Procedure: EXCHANGE OF A TUNNELED DIALYSIS CATHETER;  Surgeon: Serene Gaile ORN, MD;  Location: [facility] OR;  Service: Vascular;  Laterality: N/A;   FIDUCIAL MARKER PLACEMENT  12/18/2021   Procedure: FIDUCIAL MARKER PLACEMENT;  Surgeon: Brenna Adine CROME, DO;  Location: [facility];  Service: Pulmonary;;   IR FLUORO GUIDE CV LINE RIGHT  06/26/2022  IR US  GUIDE VASC ACCESS RIGHT  06/26/2022   OVARY SURGERY     RIGHT OOPHORECTOMY Right    SMALL INTESTINE SURGERY     SUBXYPHOID PERICARDIAL WINDOW N/A 06/18/2022   Procedure: SUBXYPHOID PERICARDIAL WINDOW;  Surgeon: Obadiah Coy, MD;  Location: [facility] OR;  Service: Thoracic;  Laterality: N/A;   TEE WITHOUT CARDIOVERSION N/A 06/18/2022   Procedure: TRANSESOPHAGEAL ECHOCARDIOGRAM (TEE);  Surgeon: Obadiah Coy, MD;  Location: [facility] OR;  Service: Thoracic;  Laterality: N/A;   TUBAL LIGATION     VIDEO BRONCHOSCOPY  WITH RADIAL ENDOBRONCHIAL ULTRASOUND  12/18/2021   Procedure: RADIAL ENDOBRONCHIAL ULTRASOUND;  Surgeon: Brenna Adine CROME, DO;  Location: [facility];  Service: Pulmonary;;    Family History  Problem Relation Age of Onset   Diabetes Mother    Hypertension Mother    Hypertension Father     Social History   Socioeconomic History   Marital status: Married    Spouse name: Not on file   Number of children: Not on file   Years of education: Not on file   Highest education level: Not on file  Occupational History   Not on file  Tobacco Use   Smoking status: Every Day    Current packs/day: 0.50    Average packs/day: 0.5 packs/day for 40.0 years (20.0 ttl pk-yrs)    Types: Cigarettes   Smokeless tobacco: Never   Tobacco comments:    Cutting back 1/4-1/2 ppd  Vaping Use   Vaping status: Never Used  Substance and Sexual Activity   Alcohol use: Not Currently   Drug use: Yes    Frequency: 4.0 times per week    Types: Marijuana    Comment: smokes every other day   Sexual activity: Not Currently    Birth control/protection: Abstinence  Other Topics Concern   Not on file  Social History Narrative   Not on file   Social Drivers of Health   Financial Resource Strain: Low Risk  (03/09/2024)   Overall Financial Resource Strain (CARDIA)    Difficulty of Paying Living Expenses: Not hard at all  Food Insecurity: Food Insecurity Present (03/11/2024)   Hunger Vital Sign    Worried About Running Out of Food in the Last Year: Sometimes true    Ran Out of Food in the Last Year: Never true  Transportation Needs: Unmet Transportation Needs (03/11/2024)   PRAPARE - Administrator, Civil Service (Medical): Yes    Lack of Transportation (Non-Medical): No  Physical Activity: Inactive (03/09/2024)   Exercise Vital Sign    Days of Exercise per Week: 0 days    Minutes of Exercise per Session: 0 min  Stress: No Stress Concern Present (03/09/2024)   [employer] of Occupational  Health - Occupational Stress Questionnaire    Feeling of Stress: Not at all  Social Connections: Socially Isolated (03/09/2024)   Social Connection and Isolation Panel    Frequency of Communication with Friends and Family: More than three times a week    Frequency of Social Gatherings with Friends and Family: More than three times a week    Attends Religious Services: Never    Database Administrator or Organizations: No    Attends Banker Meetings: Never    Marital Status: Never married  Intimate Partner Violence: Not At Risk (03/11/2024)   Humiliation, Afraid, Rape, and Kick questionnaire    Fear of Current or Ex-Partner: No    Emotionally Abused: No    Physically Abused: No  Sexually Abused: No    ROS All ROS negative except what is listed in the HPI.  Objective:   Today's Vitals: BP (!) 125/50   Pulse 100   Temp 98.8 F (37.1 C) (Oral)   Ht 5' 7 (1.702 m)   Wt 123 lb (55.8 kg)   LMP 09/29/2011   SpO2 97%   BMI 19.26 kg/m   Physical Exam Vitals reviewed.  Constitutional:      General: She is not in acute distress.    Appearance: Normal appearance.  HENT:     Right Ear: There is impacted cerumen.     Left Ear: There is impacted cerumen.     Nose: Congestion present.     Mouth/Throat:     Mouth: Mucous membranes are moist.     Pharynx: Oropharynx is clear. No oropharyngeal exudate.  Cardiovascular:     Rate and Rhythm: Normal rate and regular rhythm.  Pulmonary:     Breath sounds: No wheezing.     Comments: diminished Neurological:     Mental Status: She is alert and oriented to person, place, and time.  Psychiatric:        Mood and Affect: Mood normal.        Behavior: Behavior normal.        Thought Content: Thought content normal.        Judgment: Judgment normal.          Assessment & Plan:   Problem List Items Addressed This Visit   None Visit Diagnoses       Respiratory infection    -  Primary 2 weeks of ear pressure, nasal  congestion, cough, drainage, chest congestion. Given she is higher risk due to chemo, will go ahead and treat with doxy - do not take near vitamins/minerals. Take with food/water.  Continue supportive measures including rest, hydration, humidifier use, steam showers, warm compresses to sinuses, warm liquids with lemon and honey, and over-the-counter cough, cold, and analgesics as needed.   Please contact office for follow-up if symptoms do not improve or worsen. Seek emergency care if symptoms become severe.       Bilateral impacted cerumen     Unsuccessful irrigation today. Try debrox drops and come back in 5-7 days to try again. Stop using Qtips.           Follow-up: Return if symptoms worsen or fail to improve.   Amber FURY Almarie, DNP, FNP-C  I,Emily Lagle,acting as a neurosurgeon for Amber KATHEE Almarie, NP.,have documented all relevant documentation on the behalf of Amber KATHEE Almarie, NP,as directed by  Amber KATHEE Almarie, NP while in the presence of Amber KATHEE Almarie, NP.   I, Amber KATHEE Almarie, NP, have reviewed all documentation for this visit. The documentation on 05/25/2024 for the exam, diagnosis, procedures, and orders are all accurate and complete.

## 2024-05-26 ENCOUNTER — Encounter (INDEPENDENT_AMBULATORY_CARE_PROVIDER_SITE_OTHER): Admitting: Ophthalmology

## 2024-05-26 DIAGNOSIS — Z794 Long term (current) use of insulin: Secondary | ICD-10-CM

## 2024-05-26 DIAGNOSIS — H35033 Hypertensive retinopathy, bilateral: Secondary | ICD-10-CM

## 2024-05-26 DIAGNOSIS — I10 Essential (primary) hypertension: Secondary | ICD-10-CM

## 2024-05-26 DIAGNOSIS — Z961 Presence of intraocular lens: Secondary | ICD-10-CM

## 2024-05-26 DIAGNOSIS — E113413 Type 2 diabetes mellitus with severe nonproliferative diabetic retinopathy with macular edema, bilateral: Secondary | ICD-10-CM

## 2024-06-01 ENCOUNTER — Ambulatory Visit: Admitting: Family Medicine

## 2024-06-01 ENCOUNTER — Encounter: Payer: Self-pay | Admitting: Family Medicine

## 2024-06-01 VITALS — HR 94 | Temp 98.0°F | Resp 18 | Ht 67.0 in | Wt 117.6 lb

## 2024-06-01 DIAGNOSIS — E1140 Type 2 diabetes mellitus with diabetic neuropathy, unspecified: Secondary | ICD-10-CM

## 2024-06-01 DIAGNOSIS — Z23 Encounter for immunization: Secondary | ICD-10-CM | POA: Diagnosis not present

## 2024-06-01 DIAGNOSIS — H6123 Impacted cerumen, bilateral: Secondary | ICD-10-CM

## 2024-06-01 DIAGNOSIS — N186 End stage renal disease: Secondary | ICD-10-CM | POA: Diagnosis not present

## 2024-06-01 DIAGNOSIS — I5043 Acute on chronic combined systolic (congestive) and diastolic (congestive) heart failure: Secondary | ICD-10-CM | POA: Diagnosis not present

## 2024-06-01 DIAGNOSIS — E440 Moderate protein-calorie malnutrition: Secondary | ICD-10-CM | POA: Diagnosis not present

## 2024-06-01 NOTE — Progress Notes (Signed)
 [facility] - Clinic Note  06/08/2024     CHIEF COMPLAINT Patient presents for Retina Follow Up   HISTORY OF PRESENT ILLNESS: Amber Cross is a 63 y.o. female who presents to the clinic today for:  HPI     Retina Follow Up   Patient presents with  Diabetic Retinopathy.  In both eyes.  This started 5 weeks ago.  I, the attending physician,  performed the HPI with the patient and updated documentation appropriately.        Comments   Patient here for 5 weeks retina follow up for NPDR OU. Patient states vision is ok. No eye pain.      Last edited by Valdemar Rogue, MD on 06/08/2024 11:56 PM.    Pt states   Referring physician: Clem Brands, PA-C  [facility] [address] [place],  [place] [address]  HISTORICAL INFORMATION:  Selected notes from the MEDICAL RECORD NUMBER Referred by Clem Brands, PA-C for concern of CME / PDR LEE:  Ocular Hx- PMH-   CURRENT MEDICATIONS: No current outpatient medications on file. (Ophthalmic Drugs)   No current facility-administered medications for this visit. (Ophthalmic Drugs)   Current Outpatient Medications (Other)  Medication Sig   amlodipine -olmesartan  (AZOR ) 10-20 MG tablet Take 1 tablet by mouth at bedtime.   atorvastatin  (LIPITOR ) 80 MG tablet Take 1 tablet (80 mg total) by mouth daily.   Calcium  Acetate 668 (169 Ca) MG TABS Take 1 tablet by mouth 3 (three) times daily.   Doxercalciferol  (HECTOROL  IV) Take 3 mcg by mouth 3 (three) times a week.   fluticasone  (FLONASE ) 50 MCG/ACT nasal spray SPRAY 2 SPRAYS INTO EACH NOSTRIL EVERY DAY   gabapentin  (NEURONTIN ) 100 MG capsule Take 1 capsule (100 mg total) by mouth daily.   hydrALAZINE  (APRESOLINE ) 100 MG tablet Take 1 tablet (100 mg total) by mouth 2 (two) times daily.   Lancet Devices (ADJUSTABLE LANCING DEVICE) MISC    megestrol  (MEGACE ) 400 MG/10ML suspension Take 10 mLs (400 mg total) by mouth 2 (two) times daily.    OLANZapine  (ZYPREXA ) 10 MG tablet TAKE 1 TABLET BY MOUTH EVERYDAY AT BEDTIME   pantoprazole  (PROTONIX ) 40 MG tablet Take 40 mg by mouth daily.   predniSONE  (DELTASONE ) 10 MG tablet Take 1 tablet (10 mg total) by mouth daily with breakfast.   sevelamer  carbonate (RENVELA ) 800 MG tablet Take 800 mg by mouth 3 (three) times daily with meals.   traZODone  (DESYREL ) 50 MG tablet TAKE 1 TABLET BY MOUTH EVERYDAY AT BEDTIME   cephALEXin  (KEFLEX ) 250 MG capsule Take 1 capsule (250 mg total) by mouth daily. (Patient not taking: Reported on 06/08/2024)   dronabinol  (MARINOL ) 10 MG capsule Take 1 capsule (10 mg total) by mouth daily before lunch. (Patient not taking: Reported on 06/08/2024)   furosemide  (LASIX ) 40 MG tablet Take 1 tablet (40 mg total) by mouth daily as needed (swelling in legs or weight gain). (Patient not taking: Reported on 06/08/2024)   ondansetron  (ZOFRAN ) 8 MG tablet Take 1 tablet (8 mg total) by mouth every 8 (eight) hours as needed for nausea or vomiting. (Patient not taking: Reported on 06/08/2024)   No current facility-administered medications for this visit. (Other)   REVIEW OF SYSTEMS: ROS   Positive for: Musculoskeletal, Endocrine, Cardiovascular, Eyes Negative for: Constitutional, Gastrointestinal, Neurological, Skin, Genitourinary, HENT, Respiratory, Psychiatric, Allergic/Imm, Heme/Lymph Last edited by Orval Asberry RAMAN, COA on 06/08/2024 12:48 PM.      ALLERGIES Allergies  Allergen Reactions   Penicillins Itching and Other (See Comments)    redness   Strawberry (Diagnostic) Itching and Swelling   Tomato Itching and Swelling   PAST MEDICAL HISTORY Past Medical History:  Diagnosis Date   Anemia    Anemia of chronic renal failure, stage 4 (severe) (HCC) 07/08/2023   Arthritis    Chest pain 05/07/2021   CHF (congestive heart failure) (HCC)    Chronic kidney disease    dialysis M-W-F   Depression    Diabetes mellitus    Headache    Hypercholesteremia     Hypertension    Hypertensive emergency 06/16/2022   Lung cancer (HCC) 12/18/2021   RUL small cell carcinoma, s/p radiation   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   Postop check 08/05/2022   Pseudogout    Stroke (HCC) 10/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   Tamponade 06/18/2022   Tobacco abuse    Past Surgical History:  Procedure Laterality Date   ANTERIOR CRUCIATE LIGAMENT REPAIR Right    AV FISTULA PLACEMENT Left 07/01/2022   Procedure: LEFT ARTERIOVENOUS (AV) FISTULA CREATION;  Surgeon: Eliza Lonni RAMAN, MD;  Location: [facility] OR;  Service: Vascular;  Laterality: Left;  with regional block   BRONCHIAL BIOPSY  12/18/2021   Procedure: BRONCHIAL BIOPSIES;  Surgeon: Brenna Adine CROME, DO;  Location: [facility];  Service: Pulmonary;;   BRONCHIAL BRUSHINGS  12/18/2021   Procedure: BRONCHIAL BRUSHINGS;  Surgeon: Brenna Adine CROME, DO;  Location: [facility];  Service: Pulmonary;;   BRONCHIAL NEEDLE ASPIRATION BIOPSY  12/18/2021   Procedure: BRONCHIAL NEEDLE ASPIRATION BIOPSIES;  Surgeon: Brenna Adine CROME, DO;  Location: [facility];  Service: Pulmonary;;   CATARACT EXTRACTION     CHEST TUBE INSERTION Right 06/18/2022   Procedure: CHEST TUBE INSERTION;  Surgeon: Obadiah Coy, MD;  Location: [facility] OR;  Service: Thoracic;  Laterality: Right;   EXCHANGE OF A DIALYSIS CATHETER N/A 08/08/2022   Procedure: EXCHANGE OF A TUNNELED DIALYSIS CATHETER;  Surgeon: Serene Gaile ORN, MD;  Location: [facility] OR;  Service: Vascular;  Laterality: N/A;   FIDUCIAL MARKER PLACEMENT  12/18/2021   Procedure: FIDUCIAL MARKER PLACEMENT;  Surgeon: Brenna Adine CROME, DO;  Location: [facility];  Service: Pulmonary;;   IR FLUORO GUIDE CV LINE RIGHT  06/26/2022   IR US  GUIDE VASC ACCESS RIGHT  06/26/2022   OVARY SURGERY     RIGHT OOPHORECTOMY Right    SMALL INTESTINE SURGERY     SUBXYPHOID PERICARDIAL WINDOW N/A 06/18/2022   Procedure: SUBXYPHOID PERICARDIAL WINDOW;  Surgeon: Obadiah Coy, MD;  Location: [facility] OR;  Service: Thoracic;   Laterality: N/A;   TEE WITHOUT CARDIOVERSION N/A 06/18/2022   Procedure: TRANSESOPHAGEAL ECHOCARDIOGRAM (TEE);  Surgeon: Obadiah Coy, MD;  Location: [facility] OR;  Service: Thoracic;  Laterality: N/A;   TUBAL LIGATION     VIDEO BRONCHOSCOPY WITH RADIAL ENDOBRONCHIAL ULTRASOUND  12/18/2021   Procedure: RADIAL ENDOBRONCHIAL ULTRASOUND;  Surgeon: Brenna Adine CROME, DO;  Location: [facility];  Service: Pulmonary;;   FAMILY HISTORY Family History  Problem Relation Age of Onset   Diabetes Mother    Hypertension Mother    Hypertension Father    SOCIAL HISTORY Social History   Tobacco Use   Smoking status: Every Day    Current packs/day: 0.50    Average packs/day: 0.5 packs/day for 40.0 years (20.0 ttl pk-yrs)    Types: Cigarettes   Smokeless tobacco: Never   Tobacco comments:    Cutting back 1/4-1/2 ppd  Vaping Use   Vaping status:  Never Used  Substance Use Topics   Alcohol use: Not Currently   Drug use: Yes    Frequency: 4.0 times per week    Types: Marijuana    Comment: smokes every other day      OPHTHALMIC EXAM:  Base Eye Exam     Visual Acuity (Snellen - Linear)       Right Left   Dist cc 20/40 +2 20/30 +1   Dist ph cc 20/25 NI    Correction: Glasses         Tonometry (Tonopen, 12:46 PM)       Right Left   Pressure 13 12         Pupils       Dark Light Shape React APD   Right 3 2 Round Brisk None   Left 3 2 Round Brisk None         Visual Fields (Counting fingers)       Left Right    Full Full         Extraocular Movement       Right Left    Full, Ortho Full, Ortho         Neuro/Psych     Oriented x3: Yes   Mood/Affect: Normal         Dilation     Both eyes: 1.0% Mydriacyl, 2.5% Phenylephrine  @ 12:46 PM           Slit Lamp and Fundus Exam     Slit Lamp Exam       Right Left   Lids/Lashes Dermatochalasis - upper lid, mild MGD Dermatochalasis - upper lid, mild MGD   Conjunctiva/Sclera nasal pingeucula, Melanosis nasal  pingeucula, mild Melanosis   Cornea arcus, well healed cataract wound, nasal LRI, trace Punctate epithelial erosions arcus, trace Punctate epithelial erosions, trace tear film debris, well healed cataract wound, fine endo pigment   Anterior Chamber deep and clear deep and clear, no cell or flare   Iris Round and dilated Round and dilated   Lens PC IOL in good position PC IOL in good position   Anterior Vitreous mild syneresis, Posterior vitreous detachment mild syneresis         Fundus Exam       Right Left   Disc Pink and Sharp, temporal pallor, temporal PPP Pink and Sharp, temporal pallor, temporal PPP, no NVD   C/D Ratio 0.3 0.3   Macula Good foveal reflex, scattered MA, DBH and punctate exudates -- improved, trace cystic changes -- improved good foveal reflex, dense central cluster of exudates -- improved, scattered MA, DBH and central edema -- improved, mild cystic changes IT mac--improving   Vessels attenuated, Tortuous, no NV attenuated, Tortuous, no NV   Periphery Attached, scattered MA Attached, rare MA           Refraction     Wearing Rx       Sphere Cylinder Axis Add   Right +1.50 +0.50 167 +2.50   Left -0.50 +0.50 121 +2.50    Type: Bifocal           IMAGING AND PROCEDURES  Imaging and Procedures for 06/08/2024  OCT, Retina - OU - Both Eyes       Right Eye Quality was good. Central Foveal Thickness: 216. Progression has improved. Findings include normal foveal contour, no IRF, no SRF, intraretinal hyper-reflective material, pigment epithelial detachment (Interval improvement in IRF greatest superior mac--just trace cystic changes remain).   Left Eye Quality was  good. Central Foveal Thickness: 176. Progression has improved. Findings include no SRF, abnormal foveal contour, subretinal hyper-reflective material, intraretinal hyper-reflective material, intraretinal fluid, outer retinal atrophy (Interval improvement in IRF/IRHM IT macula ).   Notes *Images  captured and stored on drive  Diagnosis / Impression:  DME OU OD: Interval improvement in IRF greatest superior mac--just trace cystic changes remain OS: Interval improvement in IRF/IRHM IT macula   Clinical management:  See below  Abbreviations: NFP - Normal foveal profile. CME - cystoid macular edema. PED - pigment epithelial detachment. IRF - intraretinal fluid. SRF - subretinal fluid. EZ - ellipsoid zone. ERM - epiretinal membrane. ORA - outer retinal atrophy. ORT - outer retinal tubulation. SRHM - subretinal hyper-reflective material. IRHM - intraretinal hyper-reflective material      Intravitreal Injection, Pharmacologic Agent - OD - Right Eye       Time Out 06/08/2024. 1:22 PM. Confirmed correct patient, procedure, site, and patient consented.   Anesthesia Topical anesthesia was used. Anesthetic medications included Lidocaine  2%, Proparacaine 0.5%.   Procedure Preparation included 5% betadine to ocular surface, eyelid speculum. A (32g) needle was used.   Injection: 2 mg aflibercept  2 MG/0.05ML   Route: Intravitreal, Site: Right Eye   NDC: Q956576, Lot: [id number], Expiration date: 05/28/2025, Waste: 0 mL   Post-op Post injection exam found visual acuity of at least counting fingers. The patient tolerated the procedure well. There were no complications. The patient received written and verbal post procedure care education.      Intravitreal Injection, Pharmacologic Agent - OS - Left Eye       Time Out 06/08/2024. 1:22 PM. Confirmed correct patient, procedure, site, and patient consented.   Anesthesia Topical anesthesia was used. Anesthetic medications included Lidocaine  2%, Proparacaine 0.5%.   Procedure Preparation included 5% betadine to ocular surface, eyelid speculum. A (32g) needle was used.   Injection: 2 mg aflibercept  2 MG/0.05ML   Route: Intravitreal, Site: Left Eye   NDC: Q956576, Lot: [id number], Expiration date: 06/27/2025, Waste: 0 mL    Post-op Post injection exam found visual acuity of at least counting fingers. The patient tolerated the procedure well. There were no complications. The patient received written and verbal post procedure care education.           ASSESSMENT/PLAN:   ICD-10-CM   1. Severe nonproliferative diabetic retinopathy of both eyes with macular edema associated with type 2 diabetes mellitus (HCC)  E11.3413 OCT, Retina - OU - Both Eyes    Intravitreal Injection, Pharmacologic Agent - OD - Right Eye    Intravitreal Injection, Pharmacologic Agent - OS - Left Eye    aflibercept  (EYLEA ) SOLN 2 mg    aflibercept  (EYLEA ) SOLN 2 mg    2. Current use of insulin  (HCC)  Z79.4     3. Essential hypertension  I10     4. Hypertensive retinopathy of both eyes  H35.033     5. Pseudophakia, both eyes  Z96.1      1,2 Severe Non-proliferative diabetic retinopathy, both eyes  - A1C 7.8 (08.13.24), 10.4 (05.09.24), 6.7 (11.30.23) - s/p IVA OD #1 (05.02.24), #2 (05.30.24), #3 (07.24.24), #4 (08.21.24), #5 (10.08.24), #6 (11.05.24), #7 (12.10.24), #8 (01.14.25), #9 (02.18.25), #10 (03.25.25), #11 (04.29.25), #12 (06.03.25) - s/p IVA OS #1 (04.04.24), #2 (05.02.24), #3 (05.30.24), #4 (07.24.24), #5 (08.21.24) -- IVA resistance OS ====================== - s/p IVE OD #1 (07.23.25), #2 (08.19.25), #3 (09.24.25), #4 (10.29.25) - s/p IVE OS #1 (10.08.24), #2 (11.05.24), #3 (12.10.24), #4 (01.14.25), #5 (02.18.25), #6 (  03.25.25), #7 (04.29.25), #8 (06.03.25), #9 (07.23.25), #10 (08.19.25), #11 (09.24.25), #12 (10.29.25) - FA (04.04.24) shows leaking MA greatest posteriorly, no NV OU - BCVA OD 20/25 from 20/30, OS 20/30 - stable - exam shows scattered MA/DBH and exudates OU (OS > OD) - OCT shows OD: Interval improvement in IRF greatest superior mac--just trace cystic changes remain; OS: Interval improvement in IRF/IRHM IT macula at 5 weeks - recommend IVE OD #5 and IVE OS #13 today, 11.11.25 for DME w/ f/u in 5 wks  again - pt wishes to proceed - RBA of procedure discussed, questions answered - Eylea  approved with insurance - IVA informed consent obtained and signed, 04.04.24 (OU) - IVE informed consent obtained and signed, 10.08.24 (OU) - see procedure note - f/u in 5 wks -- DFE/OCT, possible injection  3,4. Hypertensive retinopathy OU - discussed importance of tight BP control - monitor - pt reports baseline BP 160's/60's  5. Pseudophakia OU  - s/p CE/IOL OU (Dr. Octavia, OD: 2017, OS: 03.04.25)  - IOL in good position, doing well  - monitor  Ophthalmic Meds Ordered this visit:  Meds ordered this encounter  Medications   aflibercept  (EYLEA ) SOLN 2 mg   aflibercept  (EYLEA ) SOLN 2 mg    Return in about 5 weeks (around 07/13/2024) for NPDR OU, DFE, OCT, Possible Injxn.  There are no Patient Instructions on file for this visit.  Explained the diagnoses, plan, and follow up with the patient and they expressed understanding.  Patient expressed understanding of the importance of proper follow up care.   This document serves as a record of services personally performed by Redell JUDITHANN Hans, MD, PhD. It was created on their behalf by Wanda GEANNIE Keens, COT an ophthalmic technician. The creation of this record is the provider's dictation and/or activities during the visit.    Electronically signed by:  Wanda GEANNIE Keens, COT  06/13/24 8:02 PM  This document serves as a record of services personally performed by Redell JUDITHANN Hans, MD, PhD. It was created on their behalf by Almetta Pesa, an ophthalmic technician. The creation of this record is the provider's dictation and/or activities during the visit.    Electronically signed by: Almetta Pesa, OA, 06/13/24  8:02 PM   Redell JUDITHANN Hans, M.D., Ph.D. Diseases & Surgery of the Retina and Vitreous [facility]  I have reviewed the above documentation for accuracy and completeness, and I agree with the above. Redell JUDITHANN Hans, M.D., Ph.D. 06/13/24 8:05 PM   Abbreviations: M myopia (nearsighted); A astigmatism; H hyperopia (farsighted); P presbyopia; Mrx spectacle prescription;  CTL contact lenses; OD right eye; OS left eye; OU both eyes  XT exotropia; ET esotropia; PEK punctate epithelial keratitis; PEE punctate epithelial erosions; DES dry eye syndrome; MGD meibomian gland dysfunction; ATs artificial tears; PFAT's preservative free artificial tears; NSC nuclear sclerotic cataract; PSC posterior subcapsular cataract; ERM epi-retinal membrane; PVD posterior vitreous detachment; RD retinal detachment; DM diabetes mellitus; DR diabetic retinopathy; NPDR non-proliferative diabetic retinopathy; PDR proliferative diabetic retinopathy; CSME clinically significant macular edema; DME diabetic macular edema; dbh dot blot hemorrhages; CWS cotton wool spot; POAG primary open angle glaucoma; C/D cup-to-disc ratio; HVF humphrey visual field; GVF goldmann visual field; OCT optical coherence tomography; IOP intraocular pressure; BRVO Branch retinal vein occlusion; CRVO central retinal vein occlusion; CRAO central retinal artery occlusion; BRAO branch retinal artery occlusion; RT retinal tear; SB scleral buckle; PPV pars plana vitrectomy; VH Vitreous hemorrhage; PRP panretinal laser photocoagulation; IVK intravitreal kenalog;  VMT vitreomacular traction; MH Macular hole;  NVD neovascularization of the disc; NVE neovascularization elsewhere; AREDS age related eye disease study; ARMD age related macular degeneration; POAG primary open angle glaucoma; EBMD epithelial/anterior basement membrane dystrophy; ACIOL anterior chamber intraocular lens; IOL intraocular lens; PCIOL posterior chamber intraocular lens; Phaco/IOL phacoemulsification with intraocular lens placement; PRK photorefractive keratectomy; LASIK laser assisted in situ keratomileusis; HTN hypertension; DM diabetes mellitus; COPD chronic obstructive pulmonary disease

## 2024-06-01 NOTE — Progress Notes (Signed)
 Established Patient Office Visit  Subjective:  Patient ID: Amber Cross, female    DOB: 1961-07-09  Age: 63 y.o. MRN: [id number]  CC:  Chief Complaint  Patient presents with   Follow-up    1 week      HPI Amber Cross is here for 1 week follow-up for impacted cerumen. She had complaints of experiencing sensation of water in her ears, fullness, and itching not relieved with severe cold and cough syrup, nasal sprays like Flonase , or Q-tips. Irrigation was attempted at visit last week but unsuccessful, so she was advised to try Debrox ear drops and return for ear irrigation. No ear pain, but she does feel fullness/itching.    She also needs us  to resend DME orders for commode, shower stool, and walker with seat.     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   Postop check 08/05/2022   Pseudogout    Stroke (HCC) 10/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   Tamponade 06/18/2022   Tobacco abuse     Past Surgical History:  Procedure Laterality Date   ANTERIOR CRUCIATE LIGAMENT REPAIR Right    AV FISTULA PLACEMENT Left 07/01/2022   Procedure: LEFT ARTERIOVENOUS (AV) FISTULA CREATION;  Surgeon: Eliza Lonni RAMAN, MD;  Location: [facility] OR;  Service: Vascular;  Laterality: Left;  with regional block   BRONCHIAL BIOPSY  12/18/2021   Procedure: BRONCHIAL BIOPSIES;  Surgeon: Brenna Adine CROME, DO;  Location: [facility];  Service: Pulmonary;;   BRONCHIAL BRUSHINGS  12/18/2021   Procedure: BRONCHIAL BRUSHINGS;  Surgeon: Brenna Adine CROME, DO;  Location: [facility];   Service: Pulmonary;;   BRONCHIAL NEEDLE ASPIRATION BIOPSY  12/18/2021   Procedure: BRONCHIAL NEEDLE ASPIRATION BIOPSIES;  Surgeon: Brenna Adine CROME, DO;  Location: [facility];  Service: Pulmonary;;   CATARACT EXTRACTION     CHEST TUBE INSERTION Right 06/18/2022   Procedure: CHEST TUBE INSERTION;  Surgeon: Obadiah Coy, MD;  Location: [facility] OR;  Service: Thoracic;  Laterality: Right;   EXCHANGE OF A DIALYSIS CATHETER N/A 08/08/2022   Procedure: EXCHANGE OF A TUNNELED DIALYSIS CATHETER;  Surgeon: Serene Gaile ORN, MD;  Location: [facility] OR;  Service: Vascular;  Laterality: N/A;   FIDUCIAL MARKER PLACEMENT  12/18/2021   Procedure: FIDUCIAL MARKER PLACEMENT;  Surgeon: Brenna Adine CROME, DO;  Location: [facility];  Service: Pulmonary;;   IR FLUORO GUIDE CV LINE RIGHT  06/26/2022   IR US  GUIDE VASC ACCESS RIGHT  06/26/2022   OVARY SURGERY     RIGHT OOPHORECTOMY Right    SMALL INTESTINE SURGERY     SUBXYPHOID PERICARDIAL WINDOW N/A 06/18/2022   Procedure: SUBXYPHOID PERICARDIAL WINDOW;  Surgeon: Obadiah Coy, MD;  Location: [facility] OR;  Service: Thoracic;  Laterality: N/A;   TEE WITHOUT CARDIOVERSION N/A 06/18/2022   Procedure: TRANSESOPHAGEAL ECHOCARDIOGRAM (TEE);  Surgeon: Obadiah Coy, MD;  Location: [facility] OR;  Service: Thoracic;  Laterality: N/A;   TUBAL LIGATION     VIDEO BRONCHOSCOPY WITH RADIAL ENDOBRONCHIAL ULTRASOUND  12/18/2021  Procedure: RADIAL ENDOBRONCHIAL ULTRASOUND;  Surgeon: Brenna Adine CROME, DO;  Location: [facility];  Service: Pulmonary;;    Family History  Problem Relation Age of Onset   Diabetes Mother    Hypertension Mother    Hypertension Father     Social History   Socioeconomic History   Marital status: Married    Spouse name: Not on file   Number of children: Not on file   Years of education: Not on file   Highest education level: Not on file  Occupational History   Not on file  Tobacco Use   Smoking status: Every Day    Current packs/day: 0.50    Average  packs/day: 0.5 packs/day for 40.0 years (20.0 ttl pk-yrs)    Types: Cigarettes   Smokeless tobacco: Never   Tobacco comments:    Cutting back 1/4-1/2 ppd  Vaping Use   Vaping status: Never Used  Substance and Sexual Activity   Alcohol use: Not Currently   Drug use: Yes    Frequency: 4.0 times per week    Types: Marijuana    Comment: smokes every other day   Sexual activity: Not Currently    Birth control/protection: Abstinence  Other Topics Concern   Not on file  Social History Narrative   Not on file   Social Drivers of Health   Financial Resource Strain: Low Risk  (03/09/2024)   Overall Financial Resource Strain (CARDIA)    Difficulty of Paying Living Expenses: Not hard at all  Food Insecurity: Food Insecurity Present (03/11/2024)   Hunger Vital Sign    Worried About Running Out of Food in the Last Year: Sometimes true    Ran Out of Food in the Last Year: Never true  Transportation Needs: Unmet Transportation Needs (03/11/2024)   PRAPARE - Administrator, Civil Service (Medical): Yes    Lack of Transportation (Non-Medical): No  Physical Activity: Inactive (03/09/2024)   Exercise Vital Sign    Days of Exercise per Week: 0 days    Minutes of Exercise per Session: 0 min  Stress: No Stress Concern Present (03/09/2024)   [employer] of Occupational Health - Occupational Stress Questionnaire    Feeling of Stress: Not at all  Social Connections: Socially Isolated (03/09/2024)   Social Connection and Isolation Panel    Frequency of Communication with Friends and Family: More than three times a week    Frequency of Social Gatherings with Friends and Family: More than three times a week    Attends Religious Services: Never    Database Administrator or Organizations: No    Attends Banker Meetings: Never    Marital Status: Never married  Intimate Partner Violence: Not At Risk (03/11/2024)   Humiliation, Afraid, Rape, and Kick questionnaire    Fear of  Current or Ex-Partner: No    Emotionally Abused: No    Physically Abused: No    Sexually Abused: No    ROS All ROS negative except what is listed in the HPI.   Objective:   Today's Vitals: Pulse 94   Temp 98 F (36.7 C)   Resp 18   Ht 5' 7 (1.702 m)   Wt 117 lb 9.6 oz (53.3 kg)   LMP 09/29/2011   SpO2 95%   BMI 18.42 kg/m   Physical Exam Vitals reviewed.  Constitutional:      Appearance: Normal appearance.  HENT:     Right Ear: There is impacted cerumen (50%).  Left Ear: There is impacted cerumen.  Neurological:     Mental Status: She is alert and oriented to person, place, and time.  Psychiatric:        Mood and Affect: Mood normal.        Behavior: Behavior normal.        Thought Content: Thought content normal.        Judgment: Judgment normal.     Assessment & Plan:   Problem List Items Addressed This Visit       Active Problems   ESRD (end stage renal disease) (HCC) (Chronic)   Relevant Orders   DME Bedside commode   For home use only DME Shower stool   For home use only DME 4 wheeled rolling walker with seat ([id number])   CHF NYHA class I, acute on chronic, combined (HCC)   Relevant Orders   DME Bedside commode   For home use only DME Shower stool   For home use only DME 4 wheeled rolling walker with seat ([id number])   Malnutrition of moderate degree   Relevant Orders   DME Bedside commode   For home use only DME Shower stool   For home use only DME 4 wheeled rolling walker with seat ([id number])   Neuropathy due to type 2 diabetes mellitus (HCC) - Primary   Relevant Orders   DME Bedside commode   For home use only DME Shower stool   For home use only DME 4 wheeled rolling walker with seat ([id number])   Other Visit Diagnoses       Bilateral impacted cerumen     Unsuccessful irrigation again today.  Debrox drops encouraged. Referral to ENT for further irrigation attempts.    Relevant Orders   Ambulatory referral to ENT         Need for  pneumococcal vaccination       Relevant Orders   Pneumococcal conjugate vaccine 20-valent (Prevnar 20) (Completed)           Follow-up: Return if symptoms worsen or fail to improve, for / CPE March/April 2026.   Waddell FURY Almarie, DNP, FNP-C  I,Emily Lagle,acting as a neurosurgeon for Waddell KATHEE Almarie, NP.,have documented all relevant documentation on the behalf of Waddell KATHEE Almarie, NP.  I, Waddell KATHEE Almarie, NP, have reviewed all documentation for this visit. The documentation on 06/01/2024 for the exam, diagnosis, procedures, and orders are all accurate and complete.

## 2024-06-03 ENCOUNTER — Telehealth: Payer: Self-pay

## 2024-06-03 ENCOUNTER — Ambulatory Visit: Payer: Self-pay | Admitting: Hematology & Oncology

## 2024-06-03 ENCOUNTER — Inpatient Hospital Stay

## 2024-06-03 ENCOUNTER — Ambulatory Visit (HOSPITAL_BASED_OUTPATIENT_CLINIC_OR_DEPARTMENT_OTHER)
Admission: RE | Admit: 2024-06-03 | Discharge: 2024-06-03 | Disposition: A | Discharge status: Home / Self Care | Source: Ambulatory Visit | Attending: Hematology & Oncology | Admitting: Hematology & Oncology

## 2024-06-03 DIAGNOSIS — C3411 Malignant neoplasm of upper lobe, right bronchus or lung: Secondary | ICD-10-CM

## 2024-06-03 DIAGNOSIS — R059 Cough, unspecified: Secondary | ICD-10-CM | POA: Insufficient documentation

## 2024-06-03 NOTE — Telephone Encounter (Signed)
 Pt called in stating that she cannot come to today's apt. Pt still feels bad and is coughing and seeing white sputum- declines fever or n/v- some diarrhea. Pt states she saw her PCP yesterday but there was no change in tx or scans ordered. Per Dr Timmy ok to order a chest xray for today. Advised pt to take a at home Covid test as well. Xray ordered and pt aware. Will move tx back 1-2 weeks per Dr FORBES. Scheduling aware.

## 2024-06-07 ENCOUNTER — Telehealth: Payer: Self-pay

## 2024-06-07 NOTE — Telephone Encounter (Signed)
 See phone note from 05/25/2024. These were written and at the front desk. Attempts have been made to contact patient to let her know and to see if she would like them sent elsewhere with no answer (have left messages).

## 2024-06-07 NOTE — Telephone Encounter (Signed)
Do you guys know anything about this?

## 2024-06-07 NOTE — Telephone Encounter (Signed)
 Copied from CRM [id number]. Topic: Clinical - Order For Equipment >> May 25, 2024 12:17 PM Roselie BROCKS wrote: Reason for CRM: Patient states at her appointment today she was to be giving a letter to go get a shower chair, portable toilet, and a stroller . Patient requests a return call concerning this. >> Jun 07, 2024 11:23 AM Franky GRADE wrote: Patient is calling to follow up on the shower chair, portable toilet, and a strolle equipement that was supposed to be delivered, she has not gotten a single update. Called [facility] and was asked to send a CRM. Please call patient with an update.

## 2024-06-08 ENCOUNTER — Ambulatory Visit

## 2024-06-08 ENCOUNTER — Ambulatory Visit: Admitting: Hematology & Oncology

## 2024-06-08 ENCOUNTER — Encounter: Payer: Self-pay | Admitting: Hematology & Oncology

## 2024-06-08 ENCOUNTER — Ambulatory Visit (INDEPENDENT_AMBULATORY_CARE_PROVIDER_SITE_OTHER): Admitting: Ophthalmology

## 2024-06-08 ENCOUNTER — Other Ambulatory Visit

## 2024-06-08 ENCOUNTER — Encounter (INDEPENDENT_AMBULATORY_CARE_PROVIDER_SITE_OTHER): Payer: Self-pay | Admitting: Ophthalmology

## 2024-06-08 DIAGNOSIS — E113413 Type 2 diabetes mellitus with severe nonproliferative diabetic retinopathy with macular edema, bilateral: Secondary | ICD-10-CM

## 2024-06-08 DIAGNOSIS — I10 Essential (primary) hypertension: Secondary | ICD-10-CM | POA: Diagnosis not present

## 2024-06-08 DIAGNOSIS — H35033 Hypertensive retinopathy, bilateral: Secondary | ICD-10-CM | POA: Diagnosis not present

## 2024-06-08 DIAGNOSIS — Z961 Presence of intraocular lens: Secondary | ICD-10-CM

## 2024-06-08 DIAGNOSIS — Z794 Long term (current) use of insulin: Secondary | ICD-10-CM

## 2024-06-08 MED ORDER — AFLIBERCEPT 2MG/0.05ML IZ SOLN FOR KALEIDOSCOPE
2.0000 mg | INTRAVITREAL | Status: AC | PRN
  Administered 2024-06-08: 2 mg via INTRAVITREAL

## 2024-06-08 NOTE — Telephone Encounter (Signed)
 Called patient back, she had come back on 06/01/2024 to see Waddell, she states during that visit we told her we would send these prescriptions to a medical supply store.   [name] - looks like you were working with Waddell this day, did you send these orders? Do you know which company if so? (Patient states she did not have a preference).

## 2024-06-08 NOTE — Telephone Encounter (Signed)
 Spoke with Amber Cross, orders were sent to [facility]. They were made aware via community message. They will contact patient directly.

## 2024-06-09 ENCOUNTER — Inpatient Hospital Stay

## 2024-06-09 ENCOUNTER — Telehealth: Payer: Self-pay | Admitting: Physician Assistant

## 2024-06-09 ENCOUNTER — Inpatient Hospital Stay: Attending: Radiation Oncology

## 2024-06-09 VITALS — BP 118/68 | HR 75 | Temp 98.3°F | Resp 20

## 2024-06-09 DIAGNOSIS — C7931 Secondary malignant neoplasm of brain: Secondary | ICD-10-CM | POA: Diagnosis not present

## 2024-06-09 DIAGNOSIS — Z5112 Encounter for antineoplastic immunotherapy: Secondary | ICD-10-CM | POA: Diagnosis present

## 2024-06-09 DIAGNOSIS — C3411 Malignant neoplasm of upper lobe, right bronchus or lung: Secondary | ICD-10-CM

## 2024-06-09 DIAGNOSIS — C3491 Malignant neoplasm of unspecified part of right bronchus or lung: Secondary | ICD-10-CM | POA: Insufficient documentation

## 2024-06-09 LAB — CMP (CANCER CENTER ONLY)
ALT: 7 U/L (ref 0–44)
AST: 16 U/L (ref 15–41)
Albumin: 4.4 g/dL (ref 3.5–5.0)
Alkaline Phosphatase: 59 U/L (ref 38–126)
Anion gap: 15 (ref 5–15)
BUN: 14 mg/dL (ref 8–23)
CO2: 28 mmol/L (ref 22–32)
Calcium: 9.1 mg/dL (ref 8.9–10.3)
Chloride: 97 mmol/L — ABNORMAL LOW (ref 98–111)
Creatinine: 3.11 mg/dL — ABNORMAL HIGH (ref 0.44–1.00)
GFR, Estimated: 16 mL/min — ABNORMAL LOW (ref >60)
Glucose, Bld: 195 mg/dL — ABNORMAL HIGH (ref 70–99)
Potassium: 3.1 mmol/L — ABNORMAL LOW (ref 3.5–5.1)
Sodium: 139 mmol/L (ref 135–145)
Total Bilirubin: 0.4 mg/dL (ref 0.0–1.2)
Total Protein: 6.7 g/dL (ref 6.5–8.1)

## 2024-06-09 LAB — CBC WITH DIFFERENTIAL (CANCER CENTER ONLY)
Basophils Absolute: 0 K/uL (ref 0.0–0.1)
Basophils Relative: 0 %
Blasts: 3 %
Eosinophils Absolute: 0 K/uL (ref 0.0–0.5)
Eosinophils Relative: 0 %
HCT: 34.4 % — ABNORMAL LOW (ref 36.0–46.0)
Hemoglobin: 10.9 g/dL — ABNORMAL LOW (ref 12.0–15.0)
Lymphocytes Relative: 8 %
Lymphs Abs: 0.7 K/uL (ref 0.7–4.0)
MCH: 31.9 pg (ref 26.0–34.0)
MCHC: 31.7 g/dL (ref 30.0–36.0)
MCV: 100.6 fL — ABNORMAL HIGH (ref 80.0–100.0)
Monocytes Absolute: 0.7 K/uL (ref 0.1–1.0)
Monocytes Relative: 8 %
Neutro Abs: 6.7 K/uL (ref 1.7–7.7)
Neutrophils Relative %: 81 %
Platelet Count: 122 K/uL — ABNORMAL LOW (ref 150–400)
RBC: 3.42 MIL/uL — ABNORMAL LOW (ref 3.87–5.11)
RDW: 19.2 % — ABNORMAL HIGH (ref 11.5–15.5)
WBC Count: 8.3 K/uL (ref 4.0–10.5)
nRBC: 0 % (ref 0.0–0.2)

## 2024-06-09 MED ORDER — SODIUM CHLORIDE 0.9 % IV SOLN
1200.0000 mg | Freq: Once | INTRAVENOUS | Status: AC
  Administered 2024-06-09: 1200 mg via INTRAVENOUS
  Filled 2024-06-09: qty 20

## 2024-06-09 MED ORDER — SODIUM CHLORIDE 0.9 % IV SOLN: Freq: Once | INTRAVENOUS | Status: AC

## 2024-06-09 NOTE — Progress Notes (Signed)
 Ok to release medications without differential per dr Timmy.  Ok to proceed with chemotherapy with Creatinine of 3.11 per Dr timmy

## 2024-06-09 NOTE — Telephone Encounter (Signed)
 Pt came in to pick up orders that she had requested to be printed off. Pt stated that the office better have their stuff together this time because she was tired of our S-. I gave the pt the papers that had been placed in the FO for her to pick up. Pt then asked what she was supposed to do with them, I asked if I could open the envelope and take a look at what she picked-up. Pt gave permission then stated I should already know since this office is full of s- I asked the pt if she was currently receiving home health and she responded with obviously. I told the pt to give the paper orders to her home health team so that they could get the supplies she was requesting. Pt stated that we were giving her the run around and that we were so full of s* Pt then said she would get another doctor because she was tire of everyone here. Pt then roughly jerked the envelope out of my hand before smacking my hand with envelope. Pt then said f-you, F-this office she was tired of everyone here and we should all f ourselves before walking out.

## 2024-06-09 NOTE — Patient Instructions (Signed)
 [facility] - A DEPT OF [facility]  Discharge Instructions: Thank you for choosing [facility] to provide your oncology and hematology care.   If you have a lab appointment with the Cancer Center, please go directly to the Cancer Center and check in at the registration area.  Wear comfortable clothing and clothing appropriate for easy access to any Portacath or PICC line.   We strive to give you quality time with your provider. You may need to reschedule your appointment if you arrive late (15 or more minutes).  Arriving late affects you and other patients whose appointments are after yours.  Also, if you miss three or more appointments without notifying the office, you may be dismissed from the clinic at the provider's discretion.      For prescription refill requests, have your pharmacy contact our office and allow 72 hours for refills to be completed.    Today you received the following chemotherapy and/or immunotherapy agents Tecentriq      To help prevent nausea and vomiting after your treatment, we encourage you to take your nausea medication as directed.  BELOW ARE SYMPTOMS THAT SHOULD BE REPORTED IMMEDIATELY: *FEVER GREATER THAN 100.4 F (38 C) OR HIGHER *CHILLS OR SWEATING *NAUSEA AND VOMITING THAT IS NOT CONTROLLED WITH YOUR NAUSEA MEDICATION *UNUSUAL SHORTNESS OF BREATH *UNUSUAL BRUISING OR BLEEDING *URINARY PROBLEMS (pain or burning when urinating, or frequent urination) *BOWEL PROBLEMS (unusual diarrhea, constipation, pain near the anus) TENDERNESS IN MOUTH AND THROAT WITH OR WITHOUT PRESENCE OF ULCERS (sore throat, sores in mouth, or a toothache) UNUSUAL RASH, SWELLING OR PAIN  UNUSUAL VAGINAL DISCHARGE OR ITCHING   Items with * indicate a potential emergency and should be followed up as soon as possible or go to the Emergency Department if any problems should occur.  Please show the CHEMOTHERAPY ALERT CARD or IMMUNOTHERAPY  ALERT CARD at check-in to the Emergency Department and triage nurse. Should you have questions after your visit or need to cancel or reschedule your appointment, please contact [facility] - A DEPT OF [facility]  [phone] and follow the prompts.  Office hours are 8:00 a.m. to 4:30 p.m. Monday - Friday. Please note that voicemails left after 4:00 p.m. may not be returned until the following business day.  We are closed weekends and major holidays. You have access to a nurse at all times for urgent questions. Please call the main number to the clinic [phone] and follow the prompts.  For any non-urgent questions, you may also contact your provider using MyChart. We now offer e-Visits for anyone 74 and older to request care online for non-urgent symptoms. For details visit [email or web address].   Also download the MyChart app! Go to the app store, search "MyChart", open the app, select [facility], and log in with your MyChart username and password.

## 2024-06-10 ENCOUNTER — Inpatient Hospital Stay

## 2024-06-10 NOTE — Telephone Encounter (Signed)
 Patient has been dismissed due to behavior.

## 2024-06-15 ENCOUNTER — Inpatient Hospital Stay

## 2024-06-15 ENCOUNTER — Inpatient Hospital Stay: Admitting: Hematology & Oncology

## 2024-06-22 ENCOUNTER — Inpatient Hospital Stay

## 2024-06-22 ENCOUNTER — Inpatient Hospital Stay: Admitting: Hematology & Oncology

## 2024-06-22 ENCOUNTER — Institutional Professional Consult (permissible substitution) (INDEPENDENT_AMBULATORY_CARE_PROVIDER_SITE_OTHER): Admitting: Physician Assistant

## 2024-06-29 NOTE — Progress Notes (Shared)
 [facility] - Clinic Note  07/13/2024     CHIEF COMPLAINT Patient presents for Retina Follow Up   HISTORY OF PRESENT ILLNESS: Amber Cross is a 63 y.o. female who presents to the clinic today for:  HPI     Retina Follow Up   Patient presents with  Diabetic Retinopathy.  In both eyes.  This started 5 weeks ago.  I, the attending physician,  performed the HPI with the patient and updated documentation appropriately.        Comments   Patient here for 5 weeks retina follow up for NPDR OU. Pt states VA is stable. Pt c/o floaters OU that come and go. Pt denied FOL.       Last edited by Fowler, Alana D on 07/13/2024  2:25 PM.     Pt states seeing black spots/floaters.   Referring physician: Clem Brands, PA-C  [facility] [address] [place],  [place] [address]  HISTORICAL INFORMATION:  Selected notes from the MEDICAL RECORD NUMBER Referred by Clem Brands, PA-C for concern of CME / PDR LEE:  Ocular Hx- PMH-   CURRENT MEDICATIONS: No current outpatient medications on file. (Ophthalmic Drugs)   No current facility-administered medications for this visit. (Ophthalmic Drugs)   Current Outpatient Medications (Other)  Medication Sig   amlodipine -olmesartan  (AZOR ) 10-20 MG tablet Take 1 tablet by mouth at bedtime.   atorvastatin  (LIPITOR ) 80 MG tablet Take 1 tablet (80 mg total) by mouth daily.   Calcium  Acetate 668 (169 Ca) MG TABS Take 1 tablet by mouth 3 (three) times daily.   cefdinir  (OMNICEF ) 300 MG capsule Take 1 capsule (300 mg total) by mouth daily.   cephALEXin  (KEFLEX ) 250 MG capsule Take 1 capsule (250 mg total) by mouth daily. (Patient not taking: Reported on 06/08/2024)   Doxercalciferol  (HECTOROL  IV) Take 3 mcg by mouth 3 (three) times a week.   dronabinol  (MARINOL ) 10 MG capsule Take 1 capsule (10 mg total) by mouth daily before lunch. (Patient not taking: Reported on 06/08/2024)   dronabinol  (MARINOL ) 5  MG capsule Take 1 capsule (5 mg total) by mouth 2 (two) times daily before a meal.   fluticasone  (FLONASE ) 50 MCG/ACT nasal spray SPRAY 2 SPRAYS INTO EACH NOSTRIL EVERY DAY   furosemide  (LASIX ) 40 MG tablet Take 1 tablet (40 mg total) by mouth daily as needed (swelling in legs or weight gain). (Patient not taking: Reported on 06/08/2024)   gabapentin  (NEURONTIN ) 100 MG capsule Take 1 capsule (100 mg total) by mouth daily.   hydrALAZINE  (APRESOLINE ) 100 MG tablet Take 1 tablet (100 mg total) by mouth 2 (two) times daily.   Lancet Devices (ADJUSTABLE LANCING DEVICE) MISC    megestrol  (MEGACE ) 400 MG/10ML suspension Take 10 mLs (400 mg total) by mouth 2 (two) times daily.   Methoxy PEG-Epoetin  Beta (MIRCERA IJ) 75 mcg.   Methoxy PEG-Epoetin  Beta (MIRCERA IJ) 60 mcg.   OLANZapine  (ZYPREXA ) 10 MG tablet TAKE 1 TABLET BY MOUTH EVERYDAY AT BEDTIME   ondansetron  (ZOFRAN ) 8 MG tablet Take 1 tablet (8 mg total) by mouth every 8 (eight) hours as needed for nausea or vomiting. (Patient not taking: Reported on 06/08/2024)   pantoprazole  (PROTONIX ) 40 MG tablet Take 40 mg by mouth daily.   predniSONE  (DELTASONE ) 10 MG tablet Take 1 tablet (10 mg total) by mouth daily with breakfast.   sevelamer  carbonate (RENVELA ) 800 MG tablet Take 800 mg by mouth 3 (three) times daily with  meals.   traZODone  (DESYREL ) 50 MG tablet TAKE 1 TABLET BY MOUTH EVERYDAY AT BEDTIME   No current facility-administered medications for this visit. (Other)   REVIEW OF SYSTEMS:    ALLERGIES Allergies  Allergen Reactions   Penicillins Itching and Other (See Comments)    redness   Strawberry (Diagnostic) Itching and Swelling   Tomato Itching and Swelling   PAST MEDICAL HISTORY Past Medical History:  Diagnosis Date   Anemia    Anemia of chronic renal failure, stage 4 (severe) (HCC) 07/08/2023   Arthritis    Chest pain 05/07/2021   CHF (congestive heart failure) (HCC)    Chronic kidney disease    dialysis M-W-F   Depression     Diabetes mellitus    Headache    Hypercholesteremia    Hypertension    Hypertensive emergency 06/16/2022   Lung cancer (HCC) 12/18/2021   RUL small cell carcinoma, s/p radiation   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   Postop check 08/05/2022   Pseudogout    Stroke (HCC) 10/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   Tamponade 06/18/2022   Tobacco abuse    Past Surgical History:  Procedure Laterality Date   ANTERIOR CRUCIATE LIGAMENT REPAIR Right    AV FISTULA PLACEMENT Left 07/01/2022   Procedure: LEFT ARTERIOVENOUS (AV) FISTULA CREATION;  Surgeon: Eliza Lonni RAMAN, MD;  Location: [facility] OR;  Service: Vascular;  Laterality: Left;  with regional block   BRONCHIAL BIOPSY  12/18/2021   Procedure: BRONCHIAL BIOPSIES;  Surgeon: Brenna Adine CROME, DO;  Location: [facility];  Service: Pulmonary;;   BRONCHIAL BRUSHINGS  12/18/2021   Procedure: BRONCHIAL BRUSHINGS;  Surgeon: Brenna Adine CROME, DO;  Location: [facility];  Service: Pulmonary;;   BRONCHIAL NEEDLE ASPIRATION BIOPSY  12/18/2021   Procedure: BRONCHIAL NEEDLE ASPIRATION BIOPSIES;  Surgeon: Brenna Adine CROME, DO;  Location: [facility];  Service: Pulmonary;;   CATARACT EXTRACTION     CHEST TUBE INSERTION Right 06/18/2022   Procedure: CHEST TUBE INSERTION;  Surgeon: Obadiah Coy, MD;  Location: [facility] OR;  Service: Thoracic;  Laterality: Right;   EXCHANGE OF A DIALYSIS CATHETER N/A 08/08/2022   Procedure: EXCHANGE OF A TUNNELED DIALYSIS CATHETER;  Surgeon: Serene Gaile ORN, MD;  Location: [facility] OR;  Service: Vascular;  Laterality: N/A;   FIDUCIAL MARKER PLACEMENT  12/18/2021   Procedure: FIDUCIAL MARKER PLACEMENT;  Surgeon: Brenna Adine CROME, DO;  Location: [facility];  Service: Pulmonary;;   IR FLUORO GUIDE CV LINE RIGHT  06/26/2022   IR US  GUIDE VASC ACCESS RIGHT  06/26/2022   OVARY SURGERY     RIGHT OOPHORECTOMY Right    SMALL INTESTINE SURGERY     SUBXYPHOID PERICARDIAL WINDOW N/A 06/18/2022   Procedure: SUBXYPHOID PERICARDIAL WINDOW;   Surgeon: Obadiah Coy, MD;  Location: [facility] OR;  Service: Thoracic;  Laterality: N/A;   TEE WITHOUT CARDIOVERSION N/A 06/18/2022   Procedure: TRANSESOPHAGEAL ECHOCARDIOGRAM (TEE);  Surgeon: Obadiah Coy, MD;  Location[facility] OR;  Service: Thoracic;  Laterality: N/A;   TUBAL LIGATION     VIDEO BRONCHOSCOPY WITH RADIAL ENDOBRONCHIAL ULTRASOUND  12/18/2021   Procedure: RADIAL ENDOBRONCHIAL ULTRASOUND;  Surgeon: Brenna Adine CROME, DO;  Location: [facility];  Service: Pulmonary;;   FAMILY HISTORY Family History  Problem Relation Age of Onset   Diabetes Mother    Hypertension Mother    Hypertension Father    SOCIAL HISTORY Social History   Tobacco Use   Smoking status: Every Day    Current packs/day: 0.50    Average  packs/day: 0.5 packs/day for 40.0 years (20.0 ttl pk-yrs)    Types: Cigarettes   Smokeless tobacco: Never   Tobacco comments:    Cutting back 1/4-1/2 ppd  Vaping Use   Vaping status: Never Used  Substance Use Topics   Alcohol use: Not Currently   Drug use: Yes    Frequency: 4.0 times per week    Types: Marijuana    Comment: smokes every other day      OPHTHALMIC EXAM:  Base Eye Exam     Visual Acuity (Snellen - Linear)       Right Left   Dist cc 20/40 -2 20/25 -2   Dist ph cc 20/20 -2 NI    Correction: Glasses         Tonometry (Tonopen, 2:34 PM)       Right Left   Pressure 12 13         Pupils       Pupils Dark Light Shape React APD   Right PERRL 3 2 Round Brisk None   Left PERRL 3 2 Round Brisk None         Visual Fields       Left Right    Full Full         Extraocular Movement       Right Left    Full, Ortho Full, Ortho         Neuro/Psych     Oriented x3: Yes   Mood/Affect: Normal         Dilation     Both eyes: 1.0% Mydriacyl, 2.5% Phenylephrine  @ 2:34 PM           Slit Lamp and Fundus Exam     Slit Lamp Exam       Right Left   Lids/Lashes Dermatochalasis - upper lid, mild MGD Dermatochalasis - upper  lid, mild MGD   Conjunctiva/Sclera nasal pingeucula, Melanosis nasal pingeucula, mild Melanosis   Cornea arcus, well healed cataract wound, nasal LRI, trace Punctate epithelial erosions arcus, trace Punctate epithelial erosions, trace tear film debris, well healed cataract wound, fine endo pigment   Anterior Chamber deep and clear deep and clear, no cell or flare   Iris Round and dilated Round and dilated   Lens PC IOL in good position PC IOL in good position   Anterior Vitreous mild syneresis, Posterior vitreous detachment mild syneresis         Fundus Exam       Right Left   Disc Pink and Sharp, temporal pallor, temporal PPP Pink and Sharp, temporal pallor, temporal PPP, no NVD   C/D Ratio 0.3 0.3   Macula Good foveal reflex, scattered MA, DBH and punctate exudates -- improved, trace cystic changes -- improved good foveal reflex, dense central cluster of exudates -- improved, scattered MA, DBH and central edema -- improved, mild cystic changes IT mac--improving   Vessels attenuated, Tortuous, no NV attenuated, Tortuous, no NV   Periphery Attached, scattered MA Attached, rare MA           Refraction     Wearing Rx       Sphere Cylinder Axis Add   Right +1.50 +0.50 167 +2.50   Left -0.50 +0.50 121 +2.50    Type: Bifocal           IMAGING AND PROCEDURES  Imaging and Procedures for 07/13/2024         ASSESSMENT/PLAN:   ICD-10-CM   1. Severe nonproliferative diabetic retinopathy  of both eyes with macular edema associated with type 2 diabetes mellitus (HCC)  E11.3413 OCT, Retina - OU - Both Eyes    2. Current use of insulin  (HCC)  Z79.4     3. Essential hypertension  I10     4. Hypertensive retinopathy of both eyes  H35.033     5. Pseudophakia, both eyes  Z96.1       1,2 Severe Non-proliferative diabetic retinopathy, both eyes  - A1C 7.8 (08.13.24), 10.4 (05.09.24), 6.7 (11.30.23) - s/p IVA OD #1 (05.02.24), #2 (05.30.24), #3 (07.24.24), #4 (08.21.24), #5  (10.08.24), #6 (11.05.24), #7 (12.10.24), #8 (01.14.25), #9 (02.18.25), #10 (03.25.25), #11 (04.29.25), #12 (06.03.25) - s/p IVA OS #1 (04.04.24), #2 (05.02.24), #3 (05.30.24), #4 (07.24.24), #5 (08.21.24) -- IVA resistance OS ====================== - s/p IVE OD #1 (07.23.25), #2 (08.19.25), #3 (09.24.25), #4 (10.29.25), #5 (11.11.25) - s/p IVE OS #1 (10.08.24), #2 (11.05.24), #3 (12.10.24), #4 (01.14.25), #5 (02.18.25), #6 (03.25.25), #7 (04.29.25), #8 (06.03.25), #9 (07.23.25), #10 (08.19.25), #11 (09.24.25), #12 (10.29.25), #13 (11.11.25) - FA (04.04.24) shows leaking MA greatest posteriorly, no NV OU - BCVA OD 20/20 from 20/25, OS 20/25 from 20/30 - exam shows scattered MA/DBH and exudates OU (OS > OD) - OCT shows OD: Interval improvement in IRF greatest superior mac--just trace cystic changes remain; OS: Interval improvement in IRF/IRHM IT macula at 5 weeks - recommend IVE OD #6 and IVE OS #14 today, 12.16.25 for DME w/ f/u ext to 6 wks again - pt wishes to proceed - RBA of procedure discussed, questions answered - Eylea  approved with insurance - IVA informed consent obtained and signed, 04.04.24 (OU) - IVE informed consent obtained and signed, 10.08.24 (OU) - see procedure note - f/u in 6 wks -- DFE/OCT, possible injection  3,4. Hypertensive retinopathy OU - discussed importance of tight BP control - monitor - pt reports baseline BP 160's/60's  5. Pseudophakia OU  - s/p CE/IOL OU (Dr. Octavia, OD: 2017, OS: 03.04.25)  - IOL in good position, doing well  - monitor  Ophthalmic Meds Ordered this visit:  No orders of the defined types were placed in this encounter.   No follow-ups on file.  There are no Patient Instructions on file for this visit.  Explained the diagnoses, plan, and follow up with the patient and they expressed understanding.  Patient expressed understanding of the importance of proper follow up care.   This document serves as a record of services personally  performed by Redell JUDITHANN Hans, MD, PhD. It was created on their behalf by Wanda GEANNIE Keens, COT an ophthalmic technician. The creation of this record is the provider's dictation and/or activities during the visit.    Electronically signed by:  Wanda GEANNIE Keens, COT  07/13/2024 3:30 PM  This document serves as a record of services personally performed by Redell JUDITHANN Hans, MD, PhD. It was created on their behalf by Almetta Pesa, an ophthalmic technician. The creation of this record is the provider's dictation and/or activities during the visit.    Electronically signed by: Almetta Pesa, OA, 07/13/2024  3:35 PM   Redell JUDITHANN Hans, M.D., Ph.D. Diseases & Surgery of the Retina and Vitreous [facility]   Abbreviations: M myopia (nearsighted); A astigmatism; H hyperopia (farsighted); P presbyopia; Mrx spectacle prescription;  CTL contact lenses; OD right eye; OS left eye; OU both eyes  XT exotropia; ET esotropia; PEK punctate epithelial keratitis; PEE punctate epithelial erosions; DES dry eye syndrome; MGD meibomian gland dysfunction;  ATs artificial tears; PFAT's preservative free artificial tears; NSC nuclear sclerotic cataract; PSC posterior subcapsular cataract; ERM epi-retinal membrane; PVD posterior vitreous detachment; RD retinal detachment; DM diabetes mellitus; DR diabetic retinopathy; NPDR non-proliferative diabetic retinopathy; PDR proliferative diabetic retinopathy; CSME clinically significant macular edema; DME diabetic macular edema; dbh dot blot hemorrhages; CWS cotton wool spot; POAG primary open angle glaucoma; C/D cup-to-disc ratio; HVF humphrey visual field; GVF goldmann visual field; OCT optical coherence tomography; IOP intraocular pressure; BRVO Branch retinal vein occlusion; CRVO central retinal vein occlusion; CRAO central retinal artery occlusion; BRAO branch retinal artery occlusion; RT retinal tear; SB scleral buckle; PPV pars plana vitrectomy; VH  Vitreous hemorrhage; PRP panretinal laser photocoagulation; IVK intravitreal kenalog; VMT vitreomacular traction; MH Macular hole;  NVD neovascularization of the disc; NVE neovascularization elsewhere; AREDS age related eye disease study; ARMD age related macular degeneration; POAG primary open angle glaucoma; EBMD epithelial/anterior basement membrane dystrophy; ACIOL anterior chamber intraocular lens; IOL intraocular lens; PCIOL posterior chamber intraocular lens; Phaco/IOL phacoemulsification with intraocular lens placement; PRK photorefractive keratectomy; LASIK laser assisted in situ keratomileusis; HTN hypertension; DM diabetes mellitus; COPD chronic obstructive pulmonary disease

## 2024-07-01 ENCOUNTER — Inpatient Hospital Stay

## 2024-07-01 ENCOUNTER — Inpatient Hospital Stay: Attending: Radiation Oncology

## 2024-07-01 ENCOUNTER — Other Ambulatory Visit: Payer: Self-pay

## 2024-07-01 ENCOUNTER — Inpatient Hospital Stay: Admitting: Hematology & Oncology

## 2024-07-01 ENCOUNTER — Other Ambulatory Visit: Payer: Self-pay | Admitting: *Deleted

## 2024-07-01 ENCOUNTER — Encounter: Payer: Self-pay | Admitting: Hematology & Oncology

## 2024-07-01 VITALS — BP 160/60 | HR 83 | Resp 20

## 2024-07-01 VITALS — BP 161/54 | HR 93 | Temp 98.0°F | Resp 16 | Wt 119.0 lb

## 2024-07-01 DIAGNOSIS — C3411 Malignant neoplasm of upper lobe, right bronchus or lung: Secondary | ICD-10-CM

## 2024-07-01 DIAGNOSIS — Z2989 Encounter for other specified prophylactic measures: Secondary | ICD-10-CM

## 2024-07-01 DIAGNOSIS — N186 End stage renal disease: Secondary | ICD-10-CM | POA: Diagnosis not present

## 2024-07-01 DIAGNOSIS — R112 Nausea with vomiting, unspecified: Secondary | ICD-10-CM

## 2024-07-01 DIAGNOSIS — E1140 Type 2 diabetes mellitus with diabetic neuropathy, unspecified: Secondary | ICD-10-CM

## 2024-07-01 DIAGNOSIS — R519 Headache, unspecified: Secondary | ICD-10-CM

## 2024-07-01 DIAGNOSIS — D631 Anemia in chronic kidney disease: Secondary | ICD-10-CM | POA: Diagnosis not present

## 2024-07-01 DIAGNOSIS — C3491 Malignant neoplasm of unspecified part of right bronchus or lung: Secondary | ICD-10-CM | POA: Insufficient documentation

## 2024-07-01 DIAGNOSIS — Z5112 Encounter for antineoplastic immunotherapy: Secondary | ICD-10-CM | POA: Insufficient documentation

## 2024-07-01 DIAGNOSIS — Z5111 Encounter for antineoplastic chemotherapy: Secondary | ICD-10-CM

## 2024-07-01 DIAGNOSIS — C7931 Secondary malignant neoplasm of brain: Secondary | ICD-10-CM | POA: Diagnosis not present

## 2024-07-01 DIAGNOSIS — I5043 Acute on chronic combined systolic (congestive) and diastolic (congestive) heart failure: Secondary | ICD-10-CM

## 2024-07-01 DIAGNOSIS — R059 Cough, unspecified: Secondary | ICD-10-CM

## 2024-07-01 DIAGNOSIS — D539 Nutritional anemia, unspecified: Secondary | ICD-10-CM

## 2024-07-01 DIAGNOSIS — E1142 Type 2 diabetes mellitus with diabetic polyneuropathy: Secondary | ICD-10-CM

## 2024-07-01 DIAGNOSIS — R5383 Other fatigue: Secondary | ICD-10-CM

## 2024-07-01 DIAGNOSIS — J189 Pneumonia, unspecified organism: Secondary | ICD-10-CM

## 2024-07-01 DIAGNOSIS — Z992 Dependence on renal dialysis: Secondary | ICD-10-CM | POA: Insufficient documentation

## 2024-07-01 DIAGNOSIS — I10 Essential (primary) hypertension: Secondary | ICD-10-CM

## 2024-07-01 LAB — CBC WITH DIFFERENTIAL (CANCER CENTER ONLY)
Abs Immature Granulocytes: 0.02 K/uL (ref 0.00–0.07)
Basophils Absolute: 0 K/uL (ref 0.0–0.1)
Basophils Relative: 0 %
Eosinophils Absolute: 0 K/uL (ref 0.0–0.5)
Eosinophils Relative: 1 %
HCT: 33.4 % — ABNORMAL LOW (ref 36.0–46.0)
Hemoglobin: 10.7 g/dL — ABNORMAL LOW (ref 12.0–15.0)
Immature Granulocytes: 0 %
Lymphocytes Relative: 11 %
Lymphs Abs: 0.8 K/uL (ref 0.7–4.0)
MCH: 31.6 pg (ref 26.0–34.0)
MCHC: 32 g/dL (ref 30.0–36.0)
MCV: 98.5 fL (ref 80.0–100.0)
Monocytes Absolute: 0.6 K/uL (ref 0.1–1.0)
Monocytes Relative: 8 %
Neutro Abs: 5.9 K/uL (ref 1.7–7.7)
Neutrophils Relative %: 80 %
Platelet Count: 204 K/uL (ref 150–400)
RBC: 3.39 MIL/uL — ABNORMAL LOW (ref 3.87–5.11)
RDW: 17 % — ABNORMAL HIGH (ref 11.5–15.5)
WBC Count: 7.4 K/uL (ref 4.0–10.5)
nRBC: 0 % (ref 0.0–0.2)

## 2024-07-01 LAB — CMP (CANCER CENTER ONLY)
ALT: 8 U/L (ref 0–44)
AST: 15 U/L (ref 15–41)
Albumin: 4 g/dL (ref 3.5–5.0)
Alkaline Phosphatase: 52 U/L (ref 38–126)
Anion gap: 15 (ref 5–15)
BUN: 25 mg/dL — ABNORMAL HIGH (ref 8–23)
CO2: 28 mmol/L (ref 22–32)
Calcium: 9.3 mg/dL (ref 8.9–10.3)
Chloride: 101 mmol/L (ref 98–111)
Creatinine: 4.41 mg/dL — ABNORMAL HIGH (ref 0.44–1.00)
GFR, Estimated: 11 mL/min — ABNORMAL LOW (ref >60)
Glucose, Bld: 178 mg/dL — ABNORMAL HIGH (ref 70–99)
Potassium: 3.7 mmol/L (ref 3.5–5.1)
Sodium: 144 mmol/L (ref 135–145)
Total Bilirubin: 0.2 mg/dL (ref 0.0–1.2)
Total Protein: 6.6 g/dL (ref 6.5–8.1)

## 2024-07-01 LAB — IRON AND IRON BINDING CAPACITY (CC-WL,HP ONLY)
Iron: 64 ug/dL (ref 28–170)
Saturation Ratios: 25 % (ref 10.4–31.8)
TIBC: 256 ug/dL (ref 250–450)
UIBC: 192 ug/dL

## 2024-07-01 LAB — FERRITIN: Ferritin: 846 ng/mL — ABNORMAL HIGH (ref 11–307)

## 2024-07-01 LAB — LACTATE DEHYDROGENASE: LDH: 251 U/L — ABNORMAL HIGH (ref 105–235)

## 2024-07-01 MED ORDER — SODIUM CHLORIDE 0.9 % IV SOLN
1.0000 g | Freq: Once | INTRAVENOUS | Status: AC
  Administered 2024-07-01: 1 g via INTRAVENOUS
  Filled 2024-07-01: qty 10

## 2024-07-01 MED ORDER — CEFDINIR 300 MG PO CAPS: 300.0000 mg | ORAL_CAPSULE | Freq: Every day | ORAL | 0 refills | Status: AC

## 2024-07-01 MED ORDER — DRONABINOL 5 MG PO CAPS: 5.0000 mg | ORAL_CAPSULE | Freq: Two times a day (BID) | ORAL | 0 refills | Status: AC

## 2024-07-01 MED ORDER — SODIUM CHLORIDE 0.9 % IV SOLN: Freq: Once | INTRAVENOUS | Status: AC

## 2024-07-01 MED ORDER — DEXTROSE 5 % IV SOLN: 1.0000 g | Freq: Once | INTRAVENOUS | Status: DC

## 2024-07-01 MED ORDER — SODIUM CHLORIDE 0.9 % IV SOLN
1200.0000 mg | Freq: Once | INTRAVENOUS | Status: AC
  Administered 2024-07-01: 1200 mg via INTRAVENOUS
  Filled 2024-07-01: qty 20

## 2024-07-01 MED ORDER — DEXTROSE 5 % IV SOLN
1.0000 g | Freq: Once | INTRAVENOUS | Status: DC
  Filled 2024-07-01: qty 10

## 2024-07-01 NOTE — Addendum Note (Signed)
 Addended by: MICAEL OLAM HERO on: 07/01/2024 10:25 AM   Modules accepted: Orders

## 2024-07-01 NOTE — Patient Instructions (Signed)
 [facility] - A DEPT OF [facility]  Discharge Instructions: Thank you for choosing [facility] to provide your oncology and hematology care.   If you have a lab appointment with the Cancer Center, please go directly to the Cancer Center and check in at the registration area.  Wear comfortable clothing and clothing appropriate for easy access to any Portacath or PICC line.   We strive to give you quality time with your provider. You may need to reschedule your appointment if you arrive late (15 or more minutes).  Arriving late affects you and other patients whose appointments are after yours.  Also, if you miss three or more appointments without notifying the office, you may be dismissed from the clinic at the provider's discretion.      For prescription refill requests, have your pharmacy contact our office and allow 72 hours for refills to be completed.    Today you received the following chemotherapy and/or immunotherapy agents tecentriq , rocephin     To help prevent nausea and vomiting after your treatment, we encourage you to take your nausea medication as directed.  BELOW ARE SYMPTOMS THAT SHOULD BE REPORTED IMMEDIATELY: *FEVER GREATER THAN 100.4 F (38 C) OR HIGHER *CHILLS OR SWEATING *NAUSEA AND VOMITING THAT IS NOT CONTROLLED WITH YOUR NAUSEA MEDICATION *UNUSUAL SHORTNESS OF BREATH *UNUSUAL BRUISING OR BLEEDING *URINARY PROBLEMS (pain or burning when urinating, or frequent urination) *BOWEL PROBLEMS (unusual diarrhea, constipation, pain near the anus) TENDERNESS IN MOUTH AND THROAT WITH OR WITHOUT PRESENCE OF ULCERS (sore throat, sores in mouth, or a toothache) UNUSUAL RASH, SWELLING OR PAIN  UNUSUAL VAGINAL DISCHARGE OR ITCHING   Items with * indicate a potential emergency and should be followed up as soon as possible or go to the Emergency Department if any problems should occur.  Please show the CHEMOTHERAPY ALERT CARD or  IMMUNOTHERAPY ALERT CARD at check-in to the Emergency Department and triage nurse. Should you have questions after your visit or need to cancel or reschedule your appointment, please contact [facility] - A DEPT OF [facility]  [phone] and follow the prompts.  Office hours are 8:00 a.m. to 4:30 p.m. Monday - Friday. Please note that voicemails left after 4:00 p.m. may not be returned until the following business day.  We are closed weekends and major holidays. You have access to a nurse at all times for urgent questions. Please call the main number to the clinic [phone] and follow the prompts.  For any non-urgent questions, you may also contact your provider using MyChart. We now offer e-Visits for anyone 51 and older to request care online for non-urgent symptoms. For details visit [email or web address].   Also download the MyChart app! Go to the app store, search MyChart, open the app, select [place], and log in with your MyChart username and password.

## 2024-07-01 NOTE — Progress Notes (Signed)
 Hematology and Oncology Follow Up Visit  Amber Cross [id number] 1960-12-05 63 y.o. 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   Prior Therapy:            Status post radiosurgery-completed on 01/31/2022 Carboplatinum/etoposide /Tecentriq  --s/p cycle #4 -- start on 05/07/2023 CNS radiotherapy-completed  at [facility] on 02/18/2024- -- 3000 Gy   Current Therapy: Tecentriq  1200 mg IV q 3 weeks - maintenance -- start on 09/23/2023  Aranesp  300 mcg subcu every 3 weeks for hemoglobin less than 11   Interim History:  Amber Cross is here today for follow-up and treatment.  So far, she has done incredibly well.  We did a PET scan on her.  This was done on 06/12/2023.  This showed improving thoracic and abdominal adenopathy.  She has some radiation changes in the right upper lobe.  She said that she had an MRI of the brain yesterday.  I cannot find that result.  This was done at [facility].  Overall, she is doing well with her dialysis.  They are trying to keep her weight I think around 115.  She has had no problems with pain.  She has had some sinus issues.  Scemblix may have a little bit of a sinus infection.  I will give her a dose of Rocephin in the office and then put her on some Omnicef as an outpatient.  I also think she should benefit from some Marinol .  I think we have had a hard time getting that.  I think it is not available.  She has had no bleeding.  She has had no problems with bowels or bladder.  She is still urinating despite the renal failure.  She has had no leg swelling.  There has been no rashes.  Overall, I will say that her performance status is probably ECOG 1.    Medications:  Allergies as of 07/01/2024       Reactions   Penicillins Itching, Other (See Comments)   redness   Strawberry (diagnostic) Itching, Swelling   Tomato  Itching, Swelling        Medication List        Accurate as of July 01, 2024  9:44 AM. If you have any questions, ask your nurse or doctor.          Adjustable Lancing Device Misc   amlodipine -olmesartan  10-20 MG tablet Commonly known as: AZOR  Take 1 tablet by mouth at bedtime.   atorvastatin  80 MG tablet Commonly known as: LIPITOR  Take 1 tablet (80 mg total) by mouth daily.   Calcium  Acetate 668 (169 Ca) MG Tabs Take 1 tablet by mouth 3 (three) times daily.   cephALEXin  250 MG capsule Commonly known as: KEFLEX  Take 1 capsule (250 mg total) by mouth daily.   dronabinol  10 MG capsule Commonly known as: MARINOL  Take 1 capsule (10 mg total) by mouth daily before lunch.   fluticasone  50 MCG/ACT nasal spray Commonly known as: FLONASE  SPRAY 2 SPRAYS INTO EACH NOSTRIL EVERY DAY   furosemide  40 MG tablet Commonly known as: Lasix  Take 1 tablet (40 mg total) by mouth daily as needed (swelling in legs or weight gain).   gabapentin  100 MG capsule Commonly known as: NEURONTIN  Take 1 capsule (100 mg total) by mouth daily.   HECTOROL  IV Take 3 mcg by mouth 3 (three) times a week.   hydrALAZINE  100 MG  tablet Commonly known as: APRESOLINE  Take 1 tablet (100 mg total) by mouth 2 (two) times daily.   megestrol  400 MG/10ML suspension Commonly known as: MEGACE  Take 10 mLs (400 mg total) by mouth 2 (two) times daily.   MIRCERA IJ 75 mcg.   MIRCERA IJ 60 mcg.   OLANZapine  10 MG tablet Commonly known as: ZYPREXA  TAKE 1 TABLET BY MOUTH EVERYDAY AT BEDTIME   ondansetron  8 MG tablet Commonly known as: ZOFRAN  Take 1 tablet (8 mg total) by mouth every 8 (eight) hours as needed for nausea or vomiting.   pantoprazole  40 MG tablet Commonly known as: PROTONIX  Take 40 mg by mouth daily.   predniSONE  10 MG tablet Commonly known as: DELTASONE  Take 1 tablet (10 mg total) by mouth daily with breakfast.   sevelamer  carbonate 800 MG tablet Commonly known as: RENVELA  Take  800 mg by mouth 3 (three) times daily with meals.   traZODone  50 MG tablet Commonly known as: DESYREL  TAKE 1 TABLET BY MOUTH EVERYDAY AT BEDTIME        Allergies:  Allergies  Allergen Reactions   Penicillins Itching and Other (See Comments)    redness   Strawberry (Diagnostic) Itching and Swelling   Tomato Itching and Swelling    Past Medical History, Surgical history, Social history, and Family History were reviewed and updated.  Review of Systems:  Review of Systems  Constitutional: Negative.   HENT: Negative.    Eyes: Negative.   Respiratory: Negative.    Cardiovascular: Negative.   Gastrointestinal: Negative.   Genitourinary: Negative.   Musculoskeletal: Negative.   Skin: Negative.   Neurological: Negative.   Endo/Heme/Allergies: Negative.   Psychiatric/Behavioral: Negative.       Physical Exam:  weight is 119 lb (54 kg). Her oral temperature is 98 F (36.7 C). Her blood pressure is 161/54 (abnormal) and her pulse is 93. Her respiration is 16 and oxygen saturation is 96%.   Wt Readings from Last 3 Encounters:  07/01/24 119 lb (54 kg)  06/01/24 117 lb 9.6 oz (53.3 kg)  05/25/24 123 lb (55.8 kg)    Physical Exam Vitals reviewed.  HENT:     Head: Normocephalic and atraumatic.  Eyes:     Pupils: Pupils are equal, round, and reactive to light.  Cardiovascular:     Rate and Rhythm: Normal rate and regular rhythm.     Heart sounds: Normal heart sounds.  Pulmonary:     Effort: Pulmonary effort is normal.     Breath sounds: Normal breath sounds.  Abdominal:     General: Bowel sounds are normal.     Palpations: Abdomen is soft.  Musculoskeletal:        General: No tenderness or deformity. Normal range of motion.     Cervical back: Normal range of motion.  Lymphadenopathy:     Cervical: No cervical adenopathy.  Skin:    General: Skin is warm and dry.     Findings: No erythema or rash.  Neurological:     Mental Status: She is alert and oriented to  person, place, and time.  Psychiatric:        Behavior: Behavior normal.        Thought Content: Thought content normal.        Judgment: Judgment normal.      Lab Results  Component Value Date   WBC 7.4 07/01/2024   HGB 10.7 (L) 07/01/2024   HCT 33.4 (L) 07/01/2024   MCV 98.5 07/01/2024   PLT 204  07/01/2024   Lab Results  Component Value Date   FERRITIN 1,263 (H) 05/25/2024   IRON 47 05/25/2024   TIBC 239 (L) 05/25/2024   UIBC 192 05/25/2024   IRONPCTSAT 20 05/25/2024   Lab Results  Component Value Date   RETICCTPCT 1.9 01/08/2023   RBC 3.39 (L) 07/01/2024   Lab Results  Component Value Date   KPAFRELGTCHN 123.1 (H) 01/31/2022   LAMBDASER 36.3 (H) 01/31/2022   KAPLAMBRATIO 3.39 (H) 01/31/2022   Lab Results  Component Value Date   IGGSERUM 768 05/09/2021   IGA 150 05/09/2021   IGMSERUM 60 05/09/2021   Lab Results  Component Value Date   TOTALPROTELP 5.3 (L) 01/31/2022   ALBUMINELP 1.8 (L) 01/31/2022   A1GS 0.4 01/31/2022   A2GS 1.3 (H) 01/31/2022   BETS 1.0 01/31/2022   GAMS 0.8 01/31/2022   MSPIKE Not Observed 01/31/2022   SPEI Comment 01/31/2022     Chemistry      Component Value Date/Time   NA 139 06/09/2024 1101   NA 141 03/11/2023 1220   K 3.1 (L) 06/09/2024 1101   CL 97 (L) 06/09/2024 1101   CO2 28 06/09/2024 1101   BUN 14 06/09/2024 1101   BUN 26 03/11/2023 1220   CREATININE 3.11 (H) 06/09/2024 1101      Component Value Date/Time   CALCIUM  9.1 06/09/2024 1101   ALKPHOS 59 06/09/2024 1101   AST 16 06/09/2024 1101   ALT 7 06/09/2024 1101   BILITOT 0.4 06/09/2024 1101       Impression and Plan: Ms. Laidlaw is a very nice 63 yo African American female with limited stage small cell lung cancer in addition to ESRD.   Her PET scan looks quite good.  I am very happy about this.  She is doing well on the maintenance Tecentriq .  For right now, I will keep her on the maintenance Tecentriq .  Maybe, we will be able to move her treatments out a  little bit longer.  I know she has been followed for her CNS disease by [facility] radiation therapy.  She gets her dialysis.  I think that is going well..  Will plan to get her back in another 3 to 4 weeks.   Maude JONELLE Crease, MD 12/4/20259:44 AM

## 2024-07-13 ENCOUNTER — Encounter (INDEPENDENT_AMBULATORY_CARE_PROVIDER_SITE_OTHER): Payer: Self-pay | Admitting: Ophthalmology

## 2024-07-13 ENCOUNTER — Ambulatory Visit (INDEPENDENT_AMBULATORY_CARE_PROVIDER_SITE_OTHER): Admitting: Ophthalmology

## 2024-07-13 DIAGNOSIS — Z794 Long term (current) use of insulin: Secondary | ICD-10-CM

## 2024-07-13 DIAGNOSIS — I10 Essential (primary) hypertension: Secondary | ICD-10-CM

## 2024-07-13 DIAGNOSIS — E113413 Type 2 diabetes mellitus with severe nonproliferative diabetic retinopathy with macular edema, bilateral: Secondary | ICD-10-CM

## 2024-07-13 DIAGNOSIS — Z961 Presence of intraocular lens: Secondary | ICD-10-CM

## 2024-07-13 DIAGNOSIS — H35033 Hypertensive retinopathy, bilateral: Secondary | ICD-10-CM | POA: Diagnosis not present

## 2024-07-13 MED ORDER — AFLIBERCEPT 2MG/0.05ML IZ SOLN FOR KALEIDOSCOPE
2.0000 mg | INTRAVITREAL | Status: AC | PRN
  Administered 2024-07-13: 18:00:00 2 mg via INTRAVITREAL

## 2024-07-27 ENCOUNTER — Inpatient Hospital Stay: Admitting: Hematology & Oncology

## 2024-07-27 ENCOUNTER — Inpatient Hospital Stay

## 2024-08-05 NOTE — Nursing Note (Signed)
 Patient discharged by Son, with all personal belongings including wallet and home supplies (bedside commode & walker). Discharge paperwork education complete.

## 2024-08-05 NOTE — Progress Notes (Signed)
 Case Management Update  Date: 08/05/2024   Time: 8:29 AM   Patient Type: Inpatient  Spoke with Gilmer Savannah when he called back, he reports that he lives with his mom and helps her.  The patient uses a cane when she walks and she gets around well.  She drives herself to Dialysis, I encouraged him to drive her and let him know she has some cognitive impairment and her driving may not be safe, he stated that he used to drive her but she wanted to drive herself, He stated that he prepares food or she prepares it for herself, I asked if he thought she would benefit from [facility] services and he stated that she had it before and was not participating so they closed her, he feels that she will not participate again so no point, he reports that she has what she needs and he helps her as much as she needs, he will be transporting her home at [place]   Case Management Coordination Status: Coordination Complete    Anticipated Discharge Location: Home  If Plan A discharging location is not feasible: Potential Plan B: Home  Deliliah J Louvet, RN

## 2024-08-05 NOTE — Care Plan (Signed)
" °  Problem: Knowledge Deficit Goal: Patient/family/caregiver demonstrates understanding of disease process, treatment plan, medications, and discharge instructions Description: Complete learning assessment and assess knowledge base. Outcome: Progressing   Problem: Compromised Skin Integrity Goal: Skin integrity is maintained or improved Description: Assess and monitor skin integrity. Identify patients at risk for skin breakdown on admission and per policy. Collaborate with interdisciplinary team and initiate plans and interventions as needed.    Outcome: Progressing Goal: Fluid and electrolyte balance are achieved/maintained Description: Assess and monitor vital signs (orthostatic vitals if applicable), fluid intake and output, urine color, labs, skin turgor, mucous membranes, jugular venous distention, edema, circumference of edematous extremities and abdominal girth, respiratory status, and mental status.  Monitor for signs and symptoms of hypovolemia (tachycardia, rapid breathing, decreased urine output, postural hypotension, confusion, syncope).  Monitor for signs and symptoms of hypervolemia (strong rapid pulse, shortness of breath, difficulty breathing lying down, crackles heard in lung fields, edema). Collaborate with interdisciplinary team and initiate plan and interventions as ordered. Outcome: Progressing Goal: Nutritional status is improving Description: Monitor and assess patient for malnutrition (ex- brittle hair, bruises, dry skin, pale skin and conjunctiva, muscle wasting, smooth red tongue, and disorientation). Collaborate with interdisciplinary team and initiate plan and interventions as ordered.  Monitor patient's weight and dietary intake as ordered or per policy. Utilize nutrition screening tool and intervene per policy. Determine patient's food preferences and provide high-protein, high-caloric foods as appropriate.  Outcome: Progressing   Problem: Urinary Incontinence Goal:  Perineal skin integrity is maintained or improved Description: Assess genitourinary system, perineal skin, labs (urinalysis), and history of incontinence to include past management, aggravating, and alleviating factors.  Collaborate with interdisciplinary team and initiate plans and interventions as needed. Outcome: Progressing   Problem: Health Behavior: Goal: MCB Ability to state ways to decrease the risk of falls will be met by discharge Description: Ability to state ways to decrease the risk of falls will improve by discharge Outcome: Progressing   Problem: Safety: Goal: Will remain free from falls by discharge Description: Will remain free from falls by discharge Outcome: Progressing   Problem: Pain Goal: Improvement in pain assessment Outcome: Progressing   "

## 2024-08-05 NOTE — Progress Notes (Signed)
" ° ° °  PATIENT: Amber Cross, Amber Cross     FIN #: [id number]  MRN:  [id number] PROVIDER Name:        LAMAR FURNISH MD     PHYSICIAN RESPONSE: Acute Hypoxic Respiratory Failure as evidence by hypoxia with increase in supplemental oxygen to 8L medium flow nasal cannula- resolved DOCUMENTATION CLARIFICATION:  Please render your professional opinion if you are treating and/or monitoring:  - Acute Hypoxic Respiratory Failure as evidence by hypoxia with increase in supplemental oxygen to 8L medium flow nasal cannula- resolved - Other (please specify) _____________________   Signs/Symptoms: 01/04 ED Provider Note- Patient presents with complaints of shortness of breath, onset a few days ago. - Pt's initial vitals show that she is tachypneic, tachycardic, and hypertensive.  Patient was in mild to moderate distress secondary to her shortness of breath.  Patient ended up getting more comfortable on 5 L O2 medium flow. - Patient became more hypoxic and had to be increased to 8 L O2 via medium flow.  ED Triage Notes- Pt arrives via ems from home with c/o shortness of breath, chest pain, and cough that started 2 days.  She had dialysis treatment on Friday.  At arrival she was 80% on room air, she was placed on [facility] and given 1 duo-neb treatment.  H/P- Patient feels that her breathing is heavy, she has a mild cough productive of white sputum. Due to progression of symptoms, she came to the ED for evaluation.  In ED, she was noted to have an O2 saturation of 80% on room air and was given DuoNebs and O2. # Hypoxia # Pleural effusion # Bilateral pneumonia  Vital signs per nursing documentation: Maximum amount of supplemental oxygen: 8L medium flow nasal cannula Lowest Sp02: 88% on 4LNC Respirations range: 16-29  01/04 VBG: pH 7.390 pCO2 45.8  Treatment/Evaluation: DuoNebs, supplemental oxygen, vital signs, VBG, CT Chest, Thoracentesis.  Risk Factors: 64 year old female with a history of ESRD and CHF  presents with shortness of breath, found to have bilateral Pneumonia and a large pleural effusion.   Mallorie Jessup, [employer], Theme Park [email or web address], available via secure chat   The patient's Clinical Indicators include: See above.  MRN: [id number]   Clarification initiated by: Jessup, Mallorie on 08/05/2024 10:41 AM  Disclaimer: The purpose of this clarification is to ensure the accuracy and integrity of the documentation, and resultant codes, where this is conflicting, ambiguous or incomplete information, or clinical evidence for a higher degree of specificity or severity. In addition to clinical responses provided, providers are also presented with free-text and unable to determine response options.    Electronically signed by:  LAMAR FURNISH MD 08/05/2024 2:51 PM      "

## 2024-08-05 NOTE — Discharge Summary (Signed)
 [place] [facility]ist  Discharge Summary   Name: ALEE GRESSMAN Age: 64 yrs  MRN: [id number] DOB: 08/19/1960  Admit Date: 08/01/2024 Admitting Physician: Otelia Dolly, MD  Discharge Date: 08/05/2024 Discharge Physician: Lamar Furnish, MD    Discharge Diagnoses:   Principal Problem (Resolved):   Confusion Active Problems:   Type 2 diabetes mellitus with hyperglycemia, with long-term current use of insulin     (CMD)   End stage renal failure on dialysis    (CMD)   Bilateral pneumonia   CHF (congestive heart failure)    (CMD)   Appetite loss   Moderate protein-calorie malnutrition (CMD)   Metastasis to brain (CMD)   Tennis elbow   Impaired mobility and ADLs Resolved Problems:   Uncontrolled hypertension   Hypoxia   Pleural effusion on right   Hypoxemia   TO DO List at Follow-up for PCP/Specialist:   Key Medication changes: Started: Mirtazapine  (Remeron ) PO q.h.s for appetite stimulation. Adjusted: Hydralazine  dose increased for blood pressure control. Continued: Home regimen: amlodipine -olmesartan , carvedilol , insulin  sliding scale. Antibiotic: Doxycycline  initiated for pneumonia (due to prolonged QTc, avoiding azithromycin). Pending labs to follow up on: Pleural fluid analysis (cell count, culture, cytology).Basic metabolic panel and electrolytes post-dialysis. Incidental findings requiring follow-up: MRI Brain: Two new enhancing lesions in frontal lobe and left cuneus (already referred to Neurosurgery and Oncology). Right elbow X-ray: Pending read for suspected lateral epicondylitis (tennis elbow).  Pending Labs     Order Current Status   Blood Culture, 1st Set Preliminary result   Blood Culture, 2nd Set Preliminary result        Hospital Course:   For full details, please see H&P, progress notes, consult notes and ancillary notes. Briefly, ALCIE RUNIONS is a 64 y.o. year old female with a PMH of  Metastatic lung cancer (brain mets), ESRD on dialysis (since  2023), CHF with reduced EF and severe MR, HTN, HLD, T2DM (A1c 7.4 on 10/11/2022) admitted 08/01/2024 after outpatient dialysis with severe dyspnea, hypoxemia (SpO2 of 80% RA), chest heaviness, and confusion.Currently being managed for following   1. Confusion: Resolved  Likely secondary to brain metastases. MRI head (with/without contrast): Two new enhancing lesions in frontal lobe and left cuneus; no edema. S/P Gamma Knife stereotactic radiosurgery (07/20/2024). Outpatient neurosurgery follow-up on discharge. PT/OT referral placed for cognitive assessment.Rehabilitation facility vs Home Health OT with 24/7 assist . Social worker and case manager on the board   2. Tennis Elbow (Right): stable  Tenderness over lateral epicondyle. Pain managed with lidocaine  and acetaminophen . X-ray grossly did not show fracture or dislocation  If pain persists to follow up with PCP  3. Appetite Loss 2/2 metastatic malignancy Megestrol  acetate ineffective. Started mirtazapine  PO q.h.s.  4. Hypertension: stable  Severe asymptomatic hypertension. Continued home regimen: amlodipine  + olmesartan  + carvedilol . Increased hydralazine  dose.  5. Hypoxia: resolved  Etiology: Right pleural effusion and bilateral pneumonia. Thoracentesis (08/02/2024): 1625 mL hazy yellow fluid; labs pending. Post-procedure CXR: No pneumothorax. Antibiotics: Doxycycline  (due to prolonged QTc). Blood cultures no growth. O2  and DuoNebs PRN.  6. Type 2 Diabetes Mellitus Last A1c: 7.4 (10/11/2022). Updated A1c 7.1 Sliding scale insulin ; consider long-acting insulin  if needed.  7. End-Stage Renal Disease On chronic dialysis since 2023. Nephrology consulted; dialysis completed 08/04/2024.  8. Chronic Heart Failure (HFrEF) Severe MR, pulmonary hypertension. No acute exacerbation. Troponin trend: 87 >> 81 >>81 (previously 279 on 10/12/2022). EKG: Prolonged QTc, otherwise unremarkable. Volume managed via dialysis; continued  carvedilol .  9. Moderate Protein-Calorie Malnutrition Dietitian evaluated.  Renal/dialysis diet; Nepro shakes TID. Monitor weight and labs.  Patient is found stable for discharge today. Cleared by the subspeciality team for discharge. Patient advised to call 911 or visit nearest ER facility for any worsening or relapse of symptoms. Patient advised to adhere to recommended diet and pharmacological treatment. New medications sent to patient pharmacy electronically. Follow up in subspeciality clinic as per appointment date. Also discussed about the medication compliance as non-compliance increases the risk of permanent disability, morbidity and mortality. Patient verbalized understanding. Patient was provided space to ask questions and all concerns were addressed.  Labs, diagnosis and plan of care was explained to the patient and the patient verbalizes understanding. Counseled detailed on specific diet and exercises, medication effects including side effects, instruction on how to take medication, compliance and patient verbalized understanding and agreed to it.  Proper medical follow up instructed and patient agreed to follow.   Predictive Model Details        25.7% (Medium)  Factor Value   Calculated 08/05/2024 08:06 12% Braden score 17   Readmission Risk Score v2 Model 9% Number of active outpatient medication orders 21    7% Number of ED visits in last 90 days 1    6% Latest RDW in last 72 hrs 18.7 %    6% Latest BUN in last 72 hrs 25 mg/dL    The patient's chronic medical conditions were treated accordingly per the patient's home medication regimen except as noted in the plan above and in the medication list below.    Discharge Condition:   Disposition: Patient discharged to Home or Self Care in fair condition.  Diet at discharge:    Dietary Orders  (From admission, onward)               Ordered    Nepro; Three times daily with Meals  Once       Question Answer Comment  [facility]:  Nepro   Frequency: Three times daily with Meals      08/03/24 1119    Adult Diet- Renal; Dialysis (K/Phos/Na restricted)  Diet effective now       References:    Medical Nutrition Management (MNM) for Registered Dietitian  Question Answer Comment  Diet type: Renal   Renal Restriction: Dialysis (K/Phos/Na restricted)   Medical Nutrition Management By RD: Yes, Medical Nutrition Management By RD      08/01/24 1036            Activity at Discharge: Ambulate ad lib       Physical Exam at Discharge   BP 157/80 (BP Location: Right arm, Patient Position: Lying)   Pulse 95   Temp 97.5 F (36.4 C) (Oral)   Resp 18   Ht 1.702 m (5' 7)   Wt 47.5 kg (104 lb 11.5 oz)   SpO2 100%   BMI 16.40 kg/m  Physical exam on discharge: Vitals: Reviewed  General: NAD    Respiratory: CTA, no rales, rhonchi, or wheezes  Cardiovascular: Normal S1, S2, no murmur, rub, or gallop  Gastrointestinal: Abdomen soft, nontender  Skin: Dry, intact    Neuro:   Ext:   AAO X 3, no focal deficit    No pedal edema , lateral epicondyle tenderness improving            Discharge Medications:      Medication List     START taking these medications    doxycycline  100 mg tablet Commonly known as: VIBRA -TABS Take 1 tablet (100 mg  total) by mouth 2 (two) times a day for 4 days. Take with 8 oz water. Do not lie down for at least 30 minutes after.   lidocaine  4 % patch Commonly known as: SALONPAS Apply 1 patch topically daily. Start taking on: August 06, 2024   mirtazapine  7.5 mg tablet Commonly known as: REMERON  Take 1 tablet (7.5 mg total) by mouth at bedtime.   nicotine 14 mg/24 hr patch Commonly known as: NICODERM CQ Place 1 patch on the skin daily. Start taking on: August 06, 2024       CHANGE how you take these medications    hydrALAZINE  100 mg tablet Commonly known as: APRESOLINE  Take 1 tablet (100 mg total) by mouth every 8 (eight) hours. What changed: when to take this        CONTINUE taking these medications    accu-chek softclix Misc   albuterol  HFA 90 mcg/actuation inhaler Commonly known as: PROVENTIL  HFA;VENTOLIN  HFA;PROAIR  HFA Inhale 2 puffs into the lungs 3 times daily for 14 days. After 14 days, change to as needed use only   amLODIPine -olmesartan  10-20 mg (AZOR ) combo dose Take by mouth daily.   atorvastatin  80 mg tablet Commonly known as: LIPITOR  Take 80 mg by mouth Once Daily.   BC Pain Relief 845-65 mg Pwpk Generic drug: aspirin -caffeine  Take 1 packet by mouth every 6 (six) hours as needed for mild pain (1-3).   blood glucose ctl high,nml,low Soln 1 Application by miscellaneous route once a week.   calcium  acetate 667 mg (169 mg calcium ) capsule Commonly known as: PHOSLO Take 1,334 mg by mouth 3 (three) times a day.   Dexcom G7 Receiver Generic drug: blood-glucose meter,receiver,continuous   Dexcom G7 Sensor Generic drug: blood-glucose sensor   ergocalciferol  1,250 mcg (50,000 unit) capsule Commonly known as: VITAMIN D2 Take 50,000 Units by mouth every 7 days. @dialysis    fluticasone  propionate 50 mcg/spray nasal spray Commonly known as: FLONASE  Administer 2 sprays into each nostril daily as needed for rhinitis or allergies.   gabapentin  100 mg capsule Commonly known as: NEURONTIN  Take 100 mg by mouth daily.   Lancets Misc   nitroglycerin  0.4 mg SL tablet Commonly known as: NITROSTAT  Place 0.4 mg under the tongue every 5 (five) minutes as needed for chest pain.   pen needle, diabetic 31 gauge x 3/16 Ndle   Precision Q-I-D Test test strip Generic drug: glucose blood   predniSONE  20 mg tablet Commonly known as: DELTASONE  Take 20 mg by mouth daily.   traZODone  50 mg tablet Commonly known as: DESYREL  Take 50 mg by mouth at bedtime.       STOP taking these medications    carvedilol  25 mg tablet Commonly known as: COREG    cyclobenzaprine 10 mg tablet Commonly known as: FLEXERIL   dexAMETHasone  2 mg  tablet Commonly known as: DECADRON          Where to Get Your Medications     These medications were sent to [facility]  [address] [place] [address]    Hours: Mon-Fri 8:30am-5pm; Sat-Sun: Closed; Holidays: Closed Phone: [phone]  doxycycline  100 mg tablet hydrALAZINE  100 mg tablet lidocaine  4 % patch mirtazapine  7.5 mg tablet nicotine 14 mg/24 hr patch     Significant Diagnostic Tests:   LABS:  Lab Results  Component Value Date   WBC 5.24 08/05/2024   HGB 11.6 (L) 08/05/2024   HCT 35.2 (L) 08/05/2024   PLT 147 (L) 08/05/2024   CHOL 235 (  H) 04/16/2021   TRIG 107 08/21/2022   HDL 58 04/16/2021   ALT 19 01/12/2024   AST 26 01/12/2024   NA 137 08/05/2024   K 3.3 (L) 08/05/2024   CL 98 08/05/2024   CREATININE 3.48 (H) 08/05/2024   BUN 25 08/05/2024   CO2 29 08/05/2024   TSH 1.047 01/13/2024   INR 0.98 04/08/2022   HGBA1C 7.1 (H) 08/01/2024   IMAGING:  US  Thoracentesis W Imaging Right  Final Result by Omar Metro, MD (01/05 1706)  INDICATION:    64 year old female with pneumonia and right-sided pleural effusion.     EXAM:  ULTRASOUND GUIDED RIGHT THORACENTESIS     MEDICATIONS:  1% lidocaine  10 mL .     COMPLICATIONS:  None immediate.     PROCEDURE:  An ultrasound guided thoracentesis was thoroughly discussed with the  patient and questions answered. The benefits, risks, alternatives  and complications were also discussed. The patient understands and  wishes to proceed with the procedure. Written consent was obtained.     Ultrasound was performed to localize and mark an adequate pocket of  fluid in the right chest. The area was then prepped and draped in  the normal sterile fashion. 1% Lidocaine  was used for local  anesthesia. Under ultrasound guidance a 6 Fr Safe-T-Centesis  catheter was introduced. Thoracentesis was performed. The catheter  was removed and a dressing applied.     FINDINGS:  A total of  approximately 1625 mL of hazy, yellow pleural fluid was   removed.  Samples were sent to the laboratory as requested by the clinical  team.     IMPRESSION:  Successful ultrasound guided right thoracentesis yielding 1625 mL of  pleural fluid.    Performed By Abigail C. Augusta, PA-C under the direct supervision of Dr   Omar CANDIE Metro.        CT Angio Chest Pulmonary Embolism  Final Result by Alm Alverna Buddle, MD [phone])  Date of Service: 2024-08-01 07:10:00    EXAM:  CTA Chest with Intravenous Contrast    CLINICAL HISTORY:  shortness of breath, left calf pain, high risk from cancer.     TECHNIQUE:  Axial CTA images of the chest with intravenous contrast. Three-dimensional   MIP/volume rendered reformations were performed.    CONTRAST:  With; 80mL Omnipaque  350      COMPARISON:  Chest x-ray same day     FINDINGS:    PULMONARY ARTERIES  There is no intraluminal filling defect suspicious for PE.    AORTA  No thoracic aortic aneurysm or dissection. Severe atherosclerotic disease.       LUNGS  Extensive interstitial and alveolar infiltrates noted throughout both   lungs with some more focal consolidation and atelectasis on the right.   There is a moderately large, partly loculated right-sided pleural effusion   with a very small effusion on the left.     HEART AND MEDIASTINUM  No cardiomegaly. No significant pericardial effusion.    LYMPH NODES  No lymphadenopathy.    BONES  No focal osseous abnormality or acute fracture.    CHEST WALL AND UPPER ABDOMEN  Very limited imaging of the upper abdomen. Enlarged thyroid  with multiple   nodules.     IMPRESSION:    Severe bilateral pneumonia and/or pulmonary edema with a large, loculated   pleural effusion on the right.    Electronically signed by: Alm Buddle, MD on 08/01/2024 07:57:10 AM   US [place]  Per PQRS, all CT exams  are performed using one or more of the following   dose reduction techniques:  automated exposure control, adjustment of the   mA and/or kV according to patient size, or use of iterative reconstruction   technique.      XR Chest 1 View  Final Result by Alm Alverna Buddle, MD [phone])  Date of Service: 2024-08-01 06:28:00    EXAM:  XR Chest, 1  View.    CLINICAL HISTORY:  Shortness of breath.     COMPARISON:  None provided.      FINDINGS:    LUNGS AND PLEURA:  Extensive interstitial and alveolar infiltrates throughout both lungs with   a large right-sided pleural effusion.     HEART AND MEDIASTINUM:  The heart size and mediastinal contours are prominent.    BONES:  No acute osseous abnormality.    IMPRESSION:    Severe pneumonia and/or pulmonary edema with a large right-sided pleural   effusion.    Electronically signed by: Alm Buddle, MD on 08/01/2024 07:49:16 AM   US Robinette     CULTURES:  Results for orders placed or performed during the hospital encounter of 08/01/24 (from the past week)  Blood Culture, 1st Set   Specimen: Venous; Blood  Result Value Ref Range   Blood Culture No Growth after 4 days   Blood Culture, 2nd Set   Specimen: Venous; Blood  Result Value Ref Range   Blood Culture No Growth after 4 days   Aerobic Culture   Specimen: Pleural Cavity, Right; Pleural Fluid  Result Value Ref Range   Aerobic Culture No Growth    Gram Stain Result No organisms seen    Gram Stain Result No PMNs seen.     Surgeries/Procedures:   Ultrasound-guided thoracentesis (08/02/2024).  Consults:   IP CONSULT TO NEPHROLOGY IP CONSULT TO HOSPITALIST   Follow-up Appointments:     Future Appointments  Date Time Provider Department Center  09/17/2024  9:00 AM [facility] RAD ONC [facility] RAD ONC [facility]  09/20/2024  1:00 PM Tawni Melida Douse, MD [facility] RAD ONC [facility]  10/15/2024  1:00 PM Heather Stephane Belling, MD [facility] RAD ONC [facility] [place]       Electronically signed by: Lamar Furnish, MD 08/05/2024 2:54 PM Time spent on  discharge: 35 minutes.

## 2024-08-06 ENCOUNTER — Telehealth: Payer: Self-pay

## 2024-08-06 NOTE — Progress Notes (Signed)
 Case Management Discharge Note        CSN: [id number] DOB: 11-09-1960 Service: General Medicine Location: 721/01  Patient Class: Inpatient  DC Disposition: : Home or Self Care  Discharge DC Disposition: : Home or Self Care  Discharge Referrals Patient Preference: Chosen geographical local area/county shared with patient/family: Return/previous involvement Patient Preference for Post-Acute Provider Form completed: Return/Previous Involvement Case closed, patient/family agree with disposition plan: Yes           Deliliah JINNY Bernheim, RN

## 2024-08-09 NOTE — Nursing Note (Signed)
 Patient's son came to speak to me and asked several questions. I told him I would find out as much information as I could but I would also let the MD know to come and speak with him. Son has requested to NOT let patient leave AMA or D/C unless he knows about it.

## 2024-08-09 NOTE — H&P (Signed)
 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 *   Amber Cross is a 64 y.o. 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  Patient has 2 prior admissions for pneumonia and confusion.  Was stably discharged to home,  now pleasantly confused in the setting of missing dialysis on Friday.  Confusion likely secondary to uremia vs  extensive comorbidities including metastatic lesion to the brain likely causing worsening of symptoms.  -Follow ammonia levels, likely etiology uremia as patient was waxing and waning in the ED.  Reevaluate patient after dialysis with improvement in symptoms. -Palliative care consult after dialysis for goal of care discussion. CHF (congestive heart failure)    (CMD) - Patient euvolemic. Continue with Coreg  End stage renal failure on dialysis    (CMD) - On Monday Wednesday Friday schedule.  Missed her dialysis session on Friday.  Consult nephrology in the morning for dialysis. Type 2 diabetes mellitus with hyperglycemia, with long-term current use of insulin     (CMD) Will manage with corrective scale insulin  Hyperlipidemia LDL goal <100 Continue statin Chronic medical conditions and comorbidities were managed using a home-based regimen. If the prescribed regimen wasnt on the formulary, it was substituted with an equivalent formulary option.  DVT  prophylaxis: SCD Anticipated disposition : Home CODE status: Patient is Full code. Information obtained from last chart review  Full Code  Anticipate > 2 midnight stay, patient will be admitted as inpatient for FTT (failure to thrive) in adultEstimated date of discharge Aug 15, 2024  ___________________________________________________________________  Chief Complaint: Chief Complaint  Patient presents with   Weakness - Generalized    HPI: Amber Cross is a 64 y.o. female with PMHx as reviewed in the EMR that presented to [facility] with  Chief Complaint  Patient presents with   Weakness - Generalized  . 64 year old woman from home with past medical history as reviewed in the EMR and recent admissions for confusion and bilateral pneumonia now brought to the ED by son for failure to thrive/confusion in the setting of missing dialysis on Friday.  At the time my interview patient was pleasantly confused was able to participate in interview process however dozing off midsentence.  No active complaint of chest pain, palpitation, shortness of breath, headache, blurring of vision, dizziness, abdominal pain, dysuria or diarrhea.  Was living at home with her son.  Has established home health aide since her last discharge.  In the ED at the time of presentation patient was afebrile Tmax of 96.5, hypertensive saturating 98% on room air with no tachycardia or tachypnea. Labs with no leukocytosis and a stable hemoglobin 11.5, lites within reference range renal functions with BUN 59 creatinine 8.49 elevated from her baseline numbers.  No new imaging.  Allergies: Tomato, Strawberry, Tomato (solanum lycopersicum), and Penicillins  Medications:  Prior to  Admission medications  Medication Sig Start Date End Date Taking? Authorizing Provider  accu-chek softclix misc  05/25/21   Rahul Darcie Aldrich, MD  albuterol  HFA (PROVENTIL  HFA;VENTOLIN  HFA;PROAIR  HFA) 90 mcg/actuation inhaler Inhale 2 puffs  into the lungs 3 times daily for 14 days. After 14 days, change to as needed use only 05/02/22 01/11/26  Jama Rome Louder, MD  amLODIPine -olmesartan  10-20 mg (AZOR ) combo dose Take by mouth daily.    HISTORICAL PROVIDER, CONVERSION  aspirin -caffeine  (BC Pain Relief) 845-65 mg pwpk Take 1 packet by mouth every 6 (six) hours as needed for mild pain (1-3).    HISTORICAL PROVIDER, CONVERSION  atorvastatin  (LIPITOR ) 80 mg tablet Take 80 mg by mouth Once Daily. 04/13/22   Dorise Donnice Hammers, MD  blood glucose ctl high,nml,low soln 1 Application by miscellaneous route once a week. 05/25/21   Rahul Darcie Aldrich, MD  calcium  acetate (PHOSLO) 667 mg (169 mg calcium ) capsule Take 1,334 mg by mouth 3 (three) times a day. 10/02/22   HISTORICAL PROVIDER, CONVERSION  Dexcom G7 Receiver misc  02/14/22   HISTORICAL PROVIDER, CONVERSION  Dexcom G7 Sensor devi  03/20/22   HISTORICAL PROVIDER, CONVERSION  doxycycline  (VIBRA -TABS) 100 mg tablet Take 1 tablet (100 mg total) by mouth 2 (two) times a day for 4 days. Take with 8 oz water. Do not lie down for at least 30 minutes after. 08/05/24 08/09/24  Lamar Furnish, MD  ergocalciferol  (VITAMIN D2) 1,250 mcg (50,000 unit) capsule Take 50,000 Units by mouth every 7 days. @dialysis  08/21/22   HISTORICAL PROVIDER, CONVERSION  fluticasone  propionate (FLONASE ) 50 mcg/spray nasal spray Administer 2 sprays into each nostril daily as needed for rhinitis or allergies.    HISTORICAL PROVIDER, CONVERSION  gabapentin  (NEURONTIN ) 100 mg capsule Take 100 mg by mouth daily. 08/21/22   HISTORICAL PROVIDER, CONVERSION  glucose blood (Precision Q-I-D Test) test strip  05/25/21   Rahul Vinayragav Annabathula, MD  hydrALAZINE  (APRESOLINE ) 100 mg tablet Take 1 tablet (100 mg total) by mouth every 8 (eight) hours. 08/05/24 11/03/24  Lamar Furnish, MD  Lancets misc  05/25/21   Rahul Darcie Aldrich, MD  lidocaine  (SALONPAS) 4 % patch Apply 1 patch topically daily. 08/06/24 11/04/24  Lamar Furnish,  MD  mirtazapine  (REMERON ) 7.5 mg tablet Take 1 tablet (7.5 mg total) by mouth at bedtime. 08/05/24 11/03/24  Lamar Furnish, MD  nicotine (NICODERM CQ) 14 mg/24 hr patch Place 1 patch on the skin daily. 08/06/24 09/05/24  Lamar Furnish, MD  nitroglycerin  (NITROSTAT ) 0.4 mg SL tablet Place 0.4 mg under the tongue every 5 (five) minutes as needed for chest pain. 10/01/21   Eliberto Phillips, MD  pen needle, diabetic 31 gauge x 3/16 ndle  05/25/21   Rahul Vinayragav Annabathula, MD  predniSONE  (DELTASONE ) 20 mg tablet Take 20 mg by mouth daily.    HISTORICAL PROVIDER, CONVERSION  traZODone  (DESYREL ) 50 mg tablet Take 50 mg by mouth at bedtime. 05/02/22   Jama Rome Louder, MD    Medical History: Medical History[1]  Surgical History: Surgical History[2]  Social History: Social History   Socioeconomic History   Marital status: Single    Spouse name: Not on file   Number of children: Not on file   Years of education: Not on file   Highest education level: Not on file  Occupational History   Not on file  Tobacco Use   Smoking status: Former    Current packs/day: 0.00    Average packs/day: 0.5 packs/day    Types: Cigarettes  Quit date: 04/08/2022    Years since quitting: 2.3   Smokeless tobacco: Never  Substance and Sexual Activity   Alcohol use: No   Drug use: Not Currently    Types: Marijuana   Sexual activity: Not on file  Other Topics Concern   Not on file  Social History Narrative   Not on file   Social Drivers of Health   Living Situation: Low Risk (08/01/2024)   Living Situation    What is your living situation today?: I have a steady place to live    Think about the place you live. Do you have problems with any of the following? Choose all that apply:: None/None on this list  Food Insecurity: Low Risk (08/01/2024)   Food vital sign    Within the past 12 months, you worried that your food would run out before you got money to buy more: Never true    Within the past  12 months, the food you bought just didn't last and you didn't have money to get more: Never true  Transportation Needs: Unmet Transportation Needs (08/01/2024)   Transportation    In the past 12 months, has lack of reliable transportation kept you from medical appointments, meetings, work or from getting things needed for daily living? : Yes  Utilities: Low Risk (08/01/2024)   Utilities    In the past 12 months has the electric, gas, oil, or water company threatened to shut off services in your home? : No  Safety: Low Risk (08/01/2024)   Safety    How often does anyone, including family and friends, physically hurt you?: Never    How often does anyone, including family and friends, insult or talk down to you?: Never    How often does anyone, including family and friends, threaten you with harm?: Never    How often does anyone, including family and friends, scream or curse at you?: Never  Alcohol Screening: Not At Risk (04/30/2022)   Received from [facility] visits prior to 09/28/2022.   AUDIT-C    Q1: How often do you have a drink containing alcohol?: Never    Q2: How many drinks containing alcohol do you have on a typical day when you are drinking?: Patient does not drink    Q3: How often do you have six or more drinks on one occasion?: Never  Tobacco Use: High Risk (07/13/2024)   Received from [facility]   Patient History    Smoking Tobacco Use: Every Day    Smokeless Tobacco Use: Never    Passive Exposure: Not on file  Depression: Not At Risk (07/09/2024)   PHQ-2    PHQ-2 Score: 2  Recent Concern: Depression - Moderate Depression (07/09/2024)   PHQ-9    PHQ-9 Score: 10  Social Connections: Moderately Isolated (08/01/2024)   Social Connection and Isolation Panel    Frequency of Communication with Friends and Family: More than three times a week    Frequency of Social Gatherings with Friends and Family: Never    Attends Religious Services: More than  4 times per year    Active Member of [employer] or Organizations: No    Attends Banker Meetings: Never    Marital Status: Never married  Physicist, Medical Strain: Medium Risk (08/01/2024)   Overall Financial Resource Strain (CARDIA)    Difficulty of Paying Living Expenses: Somewhat hard    Family History: Family History[3]  Review of Systems: A complete review of organ systems  is negative unless otherwise mentioned in HPI  Labs/Studies:  Recent Results (from the past 24 hours)  POC Glucose   Collection Time: 08/09/24  2:39 AM  Result Value Ref Range   Glucose, POC 116 (H) 70 - 99 mg/dL  CBC with Differential   Collection Time: 08/09/24  2:59 AM  Result Value Ref Range   WBC 5.65 4.40 - 11.00 10*3/uL   RBC 3.65 (L) 4.10 - 5.10 10*6/uL   Hemoglobin 11.5 (L) 12.3 - 15.3 g/dL   Hematocrit 64.9 (L) 64.0 - 44.6 %   Mean Corpuscular Volume (MCV) 95.8 80.0 - 96.0 fL   Mean Corpuscular Hemoglobin (MCH) 31.6 27.5 - 33.2 pg   Mean Corpuscular Hemoglobin Conc (MCHC) 32.9 (L) 33.0 - 37.0 g/dL   Red Cell Distribution Width (RDW) 18.3 (H) 12.3 - 17.0 %   Platelet Count (PLT) 215 150 - 450 10*3/uL   Mean Platelet Volume (MPV) 8.8 6.8 - 10.2 fL   Neutrophils % 82 %   Lymphocytes % 7 %   Monocytes % 9 %   Eosinophils % 1 %   Basophils % 1 %   Neutrophils Absolute 4.60 1.80 - 7.80 10*3/uL   Lymphocytes # 0.40 (L) 1.00 - 4.80 10*3/uL   Monocytes # 0.50 0.00 - 0.80 10*3/uL   Eosinophils # 0.10 0.00 - 0.50 10*3/uL   Basophils # 0.00 0.00 - 0.20 10*3/uL  Comprehensive Metabolic Panel   Collection Time: 08/09/24  3:47 AM  Result Value Ref Range   Sodium 140 136 - 145 mmol/L   Potassium 3.7 3.4 - 4.5 mmol/L   Chloride 100 98 - 107 mmol/L   CO2 26 21 - 31 mmol/L   Anion Gap 14 6 - 14 mmol/L   Glucose, Random 116 (H) 70 - 99 mg/dL   Blood Urea  Nitrogen (BUN) 59 (H) 7 - 25 mg/dL   Creatinine 1.50 (H) 9.39 - 1.20 mg/dL   eGFR 5 (L) >40 fO/fpw/8.26f7   Albumin  3.8 3.5 - 5.7  g/dL   Total Protein 6.2 (L) 6.4 - 8.9 g/dL   Bilirubin, Total 0.6 0.3 - 1.0 mg/dL   Alkaline Phosphatase (ALP) 61 34 - 104 U/L   Aspartate Aminotransferase (AST) 25 13 - 39 U/L   Alanine Aminotransferase (ALT) 16 7 - 52 U/L   Calcium  8.6 8.6 - 10.3 mg/dL   BUN/Creatinine Ratio 6.9 (L) 10.0 - 20.0     Lab Results  Component Value Date   HGBA1C 7.1 (H) 08/01/2024   HGBA1C 7.4 (H) 10/11/2022   HGBA1C 7.4 (H) 04/30/2022   HGBA1C 7.4 (H) 04/30/2022    Physical Exam: Temp:  [96.5 F (35.8 C)] 96.5 F (35.8 C) Heart Rate:  [68-81] 75 Resp:  [15-16] 16 BP: (165-175)/(63-82) 174/82  BP (!) 174/82   Pulse 75   Temp 96.5 F (35.8 C) (Axillary)   Resp 16   Ht 1.702 m (5' 7)   Wt 47.2 kg (104 lb)   SpO2 97%   BMI 16.29 kg/m    Physical Exam:   Constitutional: NAD, pleasant and cooperative, confused HEENT: Pupils reactive to light and commendation.  EOMI. dry mucosa no thrush. CV: S1-S2 regular with no grade 3 murmur no JVD. No peripheral edema. No cyanosis, clubbing, or varicosities RESP: Clear to auscultation B/L, normal respiratory rate, no use of accessory muscles  GI/ GU : +BS, NT ND Neuro: II - XII grossly intact. Sensation intact Psych: AAOx3,   Skin/ Extrmities: Warm and dry. No ulcerations on exposed  arms and legs.             [1] Past Medical History: Diagnosis Date   Diabetes mellitus (CMD)    Hyperlipidemia    Hypertension    Renal disorder   [2] Past Surgical History: Procedure Laterality Date   ANTERIOR CRUCIATE LIGAMENT REPAIR     Procedure: ANTERIOR CRUCIATE LIGAMENT REPAIR   EXPLORATORY LAPAROTOMY N/A 08/23/2017   Procedure: LAPAROTOMY EXPLORATORY;  Surgeon: Harlene Macario Schultze, MD;  Location: [facility] MAIN OR;  Service: Trauma;  Laterality: N/A;  e2   HERNIA REPAIR     Procedure: HERNIA REPAIR   TUBAL LIGATION     Procedure: TUBAL LIGATION  [3] Family History Problem Relation Name Age of Onset   Diabetes Mother    "

## 2024-08-11 NOTE — Discharge Summary (Signed)
 Hospitalist  Discharge Summary   Name: Amber Cross Age: 64 yrs  MRN: [id number] DOB: 1961/01/22  Admit Date: 08/09/2024 Admitting Physician: Albina Sor, MD  Discharge Date: 08/11/2024 Discharge Physician: Roddie Curtistine Hamilton, MD   Admission Diagnosis:   Failure to thrive in adult [R62.7] FTT (failure to thrive) in adult [R62.7] ESRD (end stage renal disease) on dialysis    (CMD) [N18.6, Z99.2] Altered mental status, unspecified altered mental status type [R41.82]   Discharge Diagnoses:   Principal Problem:   FTT (failure to thrive) in adult Active Problems:   Type 2 diabetes mellitus with hyperglycemia, with long-term current use of insulin     (CMD)   Hyperlipidemia LDL goal <100   End stage renal failure on dialysis    (CMD)   Metastasis to brain (CMD)   Secondary malignant neoplasm of brain    (CMD)   ESRD (end stage renal disease) on dialysis    (CMD) Resolved Problems:   CHF (congestive heart failure)    (CMD)   Altered mental status   TO DO List at Follow-up for PCP/Specialist:   Key Medication changes:  Start nystatin powder to bilateral feet BID Follow up with PCP in 1-2 weeks Follow up with Oncologist Dr. Timmy Follow up with radiation oncology (messaged on day of dc to arrange f/u) Wound recs sacral wound: Cleanse with no rinse cleanser Pat dry, keep dry Apply thin layer of zinc barrier cream to sacrum/coccyx area twice daily Convidien pads to wick moisture Mepilex border to left buttock Change TTS and PRN soiling Waffle cushion for pressure redistribution     Hospital Course:   For full details, please see H&P, progress notes, consult notes and ancillary notes. Briefly, Amber Cross is a 64 y.o. 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 The patient's hospital course will be summarized below.  On presentation, she was noted to be pleasantly confused and lethargic, with laboratory findings significant for elevated BUN (59 mg/dL), creatinine (1.50 mg/dL), and underweight status (BMI 16.29 kg/m). Imaging included a CT head showing no acute brain disease and a chest x-ray with no acute cardiopulmonary pathology and near-complete resolution of prior findings. Her most recent brain MRI (08/03/2024) revealed two very small new lesions, with neurology recommending consideration of gamma knife if her performance status improves.  Nephrology was consulted for management of her ESRD and supervised a hemodialysis session with fluid removal to her previous post-weight, and recommended continuation of her Monday/Wednesday/Friday dialysis schedule, fluid restriction, and ongoing monitoring of laboratory parameters and intake/output.  During admission, she was also evaluated by wound care for a deep tissue injury to the left buttock and partial thickness wounds to the sacrum/coccyx/gluteal cleft area, attributed to moisture, as well as red, scaly, itchy bilateral feet. Wound care interventions included cleansing, application of zinc barrier cream, Mepilex border dressing, moisture-wicking pads, and pressure redistribution strategies. Anti-fungal powder recommended for feet.   Nutrition assessment confirmed moderate protein-calorie malnutrition related to chronic illness, with evidence of muscle and fat depletion and poor oral intake. Recommendations included Nepro shakes three times daily, encouragement of oral intake, and monitoring for wound healing and glucose control.  Pt's mental status improved with HD. Etiology of AMS likely uremia  in setting of known brain metastases.   Palliative care assisted in discussing GOC with pt  and family. At this juncture pt wishes to continue cancer treatment, including her tecentriq  and gamma knife. Pt will f/u with her oncologist Dr. Timmy after discharge and I reached out to radiation oncology to arrange f/u after discharge as well. Had extensive discussions with son Bette and niece Rosina. Bette expressed concerns about being able to care for patient at home. PT recommended home health services, which will be arranged on discharge in addition to previously established home health services. Pt feeling well on day of discharge and wanting to home. Reports that her son lives in her house and that she'd coordinate with Rosina about getting other family members to help her at home.     The patient's chronic medical conditions were treated accordingly per the patient's home medication regimen except as noted in the plan above and in the medication list below.    Discharge Condition:   Disposition: Patient discharged to Home or Self Care in fair condition.  Diet at discharge: Adult Diet- Consistent Carbohydrate; High (60-75 gm/meal HS snack); 2 gm Na limit Nepro; Twice daily with Lunch/Supper  Activity at Discharge: Ambulate ad lib   Physical Exam at Discharge   BP (!) 141/57 (BP Location: Right arm, Patient Position: Lying)   Pulse 83   Temp 98.3 F (36.8 C) (Oral)   Resp 18   Ht 1.702 m (5' 7)   Wt 43.9 kg (96 lb 12.5 oz)   SpO2 100%   BMI 15.16 kg/m  CONSTITUTIONAL: alert and oriented x3, no acute distress CARDIOVASCULAR: normal rate, regular rhythm; no murmurs appreciated, 2+ radial pulses, warm extremities RESPIRATORY: comfortable on room air, normal respiratory effort, no crackles appreciated ABDOMEN: soft, non-tender, non-distended NEURO: CN II-XII grossly intact, spontaneously moves all extremities, no focal deficits noted MSK: normal ROM, no lower extremity edema noted   Discharge Medications:       Medication List     CONTINUE taking these medications     accu-chek softclix Misc   albuterol  HFA 90 mcg/actuation inhaler Commonly known as: PROVENTIL  HFA;VENTOLIN  HFA;PROAIR  HFA Inhale 2 puffs into the lungs 3 times daily for 14 days. After 14 days, change to as needed use only   amLODIPine -olmesartan  10-20 mg (AZOR ) combo dose Take by mouth at bedtime.   atorvastatin  80 mg tablet Commonly known as: LIPITOR  Take 80 mg by mouth Once Daily.   BC Pain Relief 845-65 mg Pwpk Generic drug: aspirin -caffeine  Take 1 packet by mouth every 6 (six) hours as needed for mild pain (1-3).   blood glucose ctl high,nml,low Soln 1 Application by miscellaneous route once a week.   calcium  acetate 667 mg (169 mg calcium ) capsule Commonly known as: PHOSLO Take 1,334 mg by mouth 3 (three) times a day.   Dexcom G7 Receiver Generic drug: blood-glucose meter,receiver,continuous   Dexcom G7 Sensor Generic drug: blood-glucose sensor   ergocalciferol  1,250 mcg (50,000 unit) capsule Commonly known as: VITAMIN D2 Take 50,000 Units by mouth every 7 days. @dialysis    fluticasone  propionate 50 mcg/spray nasal spray Commonly known as: FLONASE  Administer 2 sprays into each nostril daily as needed for rhinitis or allergies.   gabapentin  100 mg capsule Commonly known as: NEURONTIN  Take 100 mg by mouth daily.   hydrALAZINE  100 mg tablet Commonly known as: APRESOLINE  Take 1 tablet (100 mg total) by mouth every 8 (eight) hours.   Lancets Misc   lidocaine  4 % patch Commonly known  as: SALONPAS Apply 1 patch topically daily.   mirtazapine  7.5 mg tablet Commonly known as: REMERON  Take 1 tablet (7.5 mg total) by mouth at bedtime.   nicotine 14 mg/24 hr patch Commonly known as: NICODERM CQ Place 1 patch on the skin daily.   nitroglycerin  0.4 mg SL tablet Commonly known as: NITROSTAT  Place 0.4 mg under the tongue every 5 (five) minutes as needed for chest pain.   pen needle, diabetic 31 gauge x 3/16 Ndle   Precision Q-I-D Test test strip Generic  drug: glucose blood   predniSONE  20 mg tablet Commonly known as: DELTASONE  Take 20 mg by mouth daily.   traZODone  50 mg tablet Commonly known as: DESYREL  Take 50 mg by mouth at bedtime.       STOP taking these medications    doxycycline  100 mg tablet Commonly known as: VIBRA -TABS        Significant Diagnostic Tests:   LABS:  Lab Results  Component Value Date   WBC 5.94 08/11/2024   HGB 10.5 (L) 08/11/2024   HCT 31.5 (L) 08/11/2024   PLT 174 08/11/2024   CHOL 235 (H) 04/16/2021   TRIG 107 08/21/2022   HDL 58 04/16/2021   ALT 11 08/11/2024   AST 15 08/11/2024   NA 134 (L) 08/11/2024   K 5.3 (H) 08/11/2024   CL 99 08/11/2024   CREATININE 6.15 (H) 08/11/2024   BUN 37 (H) 08/11/2024   CO2 24 08/11/2024   TSH 1.047 01/13/2024   INR 0.98 04/08/2022   HGBA1C 6.5 (H) 08/09/2024   IMAGING:  XR Chest 1 View  Final Result by Alm Alverna Buddle, MD (01/12 [id number])  Date of Service: 2024-08-09 05:36:00    EXAM:  XR Chest, 2  View.    CLINICAL HISTORY:  rule out pna and or edema.     COMPARISON:  August 01, 2024     FINDINGS:    LUNGS AND PLEURA:  The lungs are now mostly clear clear.   No pleural effusion or pneumothorax.    HEART AND MEDIASTINUM:  The heart size and mediastinal contours are stable.    BONES:  No acute osseous abnormality.    IMPRESSION:    No acute cardiopulmonary pathology. Complete or near-complete resolution   of significant findings noted on the prior x-ray.    Electronically signed by: Alm Buddle, MD on 08/09/2024 07:12:47 AM   US [facility]    CT Head WO Contrast W Quant CT Tiss Character When Performed  Final Result by Norleen Linzy Barnes, MD (01/12 0710)  Date of Service: 2024-08-09 05:34:00    CT of the head.    History: Altered mental status.    Technique: Axial CT images of the head without contrast.  Sagittal and   coronal reformatting is performed.    Comparison: None.    Findings: No intracranial hemorrhage, mass or  mass-effect is demonstrated.    Ventricles and cortical sulci are mildly prominent consistent with   patient age.  There are multiple small cortical calcifications, which can   be seen with chronic toxoplasmosis.  Gray-white matter differentiation is   maintained.  No skull fracture is demonstrated.  Visualized paranasal   sinuses are well aerated.  There is opacification of right mastoid air   cells and middle ear.  There is opacification of a few left mastoid air   cells.    IMPRESSION:  Impression: 1.  No acute brain disease is demonstrated.    2.  There is opacification of  the right middle ear and right mastoid air   cells which can be seen with mastoiditis and/or otitis media.    3.  There are multiple small cortical calcifications, which can be seen   with chronic toxoplasmosis.    Electronically signed by: Norleen Barnes, MD on 08/09/2024 07:10:35 AM   US [place]  Per PQRS, all CT exams are performed using one or more of the following   dose reduction techniques: automated exposure control, adjustment of the   mA and/or kV according to patient size, or use of iterative reconstruction   technique.         Consults:   IP CONSULT TO HOSPITALIST WOUND OSTOMY EVAL AND TREAT IP CONSULT TO RADIATION ONCOLOGY IP CONSULT TO NEPHROLOGY IP CONSULT TO PALLIATIVE CARE IP CONSULT TO NUTRITION SERVICES   Follow-up Appointments:     Future Appointments  Date Time Provider Department Center  09/17/2024  9:00 AM [facility] RAD ONC [facility] RAD ONC [facility] Comp Can  09/20/2024  1:00 PM Tawni Melida Douse, MD [facility] RAD ONC [facility] Comp Can  10/15/2024  1:00 PM Heather Stephane Belling, MD [facility] RAD ONC [facility] High Pt         Electronically signed by: Roddie Curtistine Hamilton, MD 08/11/2024 12:30 PM Time spent on discharge: 50 minutes.

## 2024-08-17 NOTE — Progress Notes (Shared)
 [facility] - Clinic Note  08/24/2024     CHIEF COMPLAINT Patient presents for No chief complaint on file.   HISTORY OF PRESENT ILLNESS: Amber Cross is a 64 y.o. female who presents to the clinic today for:    Pt states seeing black spots/floaters.   Referring physician: Clem Brands, PA-C  [facility] [address] [place],  [place] [address]  HISTORICAL INFORMATION:  Selected notes from the MEDICAL RECORD NUMBER Referred by Clem Brands, PA-C for concern of CME / PDR LEE:  Ocular Hx- PMH-   CURRENT MEDICATIONS: No current outpatient medications on file. (Ophthalmic Drugs)   No current facility-administered medications for this visit. (Ophthalmic Drugs)   Current Outpatient Medications (Other)  Medication Sig   amlodipine -olmesartan  (AZOR ) 10-20 MG tablet Take 1 tablet by mouth at bedtime.   atorvastatin  (LIPITOR ) 80 MG tablet Take 1 tablet (80 mg total) by mouth daily.   Calcium  Acetate 668 (169 Ca) MG TABS Take 1 tablet by mouth 3 (three) times daily.   cefdinir  (OMNICEF ) 300 MG capsule Take 1 capsule (300 mg total) by mouth daily.   cephALEXin  (KEFLEX ) 250 MG capsule Take 1 capsule (250 mg total) by mouth daily. (Patient not taking: Reported on 06/08/2024)   Doxercalciferol  (HECTOROL  IV) Take 3 mcg by mouth 3 (three) times a week.   dronabinol  (MARINOL ) 10 MG capsule Take 1 capsule (10 mg total) by mouth daily before lunch. (Patient not taking: Reported on 06/08/2024)   dronabinol  (MARINOL ) 5 MG capsule Take 1 capsule (5 mg total) by mouth 2 (two) times daily before a meal.   fluticasone  (FLONASE ) 50 MCG/ACT nasal spray SPRAY 2 SPRAYS INTO EACH NOSTRIL EVERY DAY   furosemide  (LASIX ) 40 MG tablet Take 1 tablet (40 mg total) by mouth daily as needed (swelling in legs or weight gain). (Patient not taking: Reported on 06/08/2024)   gabapentin  (NEURONTIN ) 100 MG capsule Take 1 capsule (100 mg total) by mouth daily.    hydrALAZINE  (APRESOLINE ) 100 MG tablet Take 1 tablet (100 mg total) by mouth 2 (two) times daily.   Lancet Devices (ADJUSTABLE LANCING DEVICE) MISC    megestrol  (MEGACE ) 400 MG/10ML suspension Take 10 mLs (400 mg total) by mouth 2 (two) times daily.   Methoxy PEG-Epoetin  Beta (MIRCERA IJ) 75 mcg.   Methoxy PEG-Epoetin  Beta (MIRCERA IJ) 60 mcg.   OLANZapine  (ZYPREXA ) 10 MG tablet TAKE 1 TABLET BY MOUTH EVERYDAY AT BEDTIME   ondansetron  (ZOFRAN ) 8 MG tablet Take 1 tablet (8 mg total) by mouth every 8 (eight) hours as needed for nausea or vomiting. (Patient not taking: Reported on 06/08/2024)   pantoprazole  (PROTONIX ) 40 MG tablet Take 40 mg by mouth daily.   predniSONE  (DELTASONE ) 10 MG tablet Take 1 tablet (10 mg total) by mouth daily with breakfast.   sevelamer  carbonate (RENVELA ) 800 MG tablet Take 800 mg by mouth 3 (three) times daily with meals.   traZODone  (DESYREL ) 50 MG tablet TAKE 1 TABLET BY MOUTH EVERYDAY AT BEDTIME   No current facility-administered medications for this visit. (Other)   REVIEW OF SYSTEMS:    ALLERGIES Allergies  Allergen Reactions   Penicillins Itching and Other (See Comments)    redness   Strawberry (Diagnostic) Itching and Swelling   Tomato Itching and Swelling   PAST MEDICAL HISTORY Past Medical History:  Diagnosis Date   Anemia    Anemia of chronic renal failure, stage 4 (severe) (HCC) 07/08/2023   Arthritis  Chest pain 05/07/2021   CHF (congestive heart failure) (HCC)    Chronic kidney disease    dialysis M-W-F   Depression    Diabetes mellitus    Headache    Hypercholesteremia    Hypertension    Hypertensive emergency 06/16/2022   Lung cancer (HCC) 12/18/2021   RUL small cell carcinoma, s/p radiation   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   Postop check 08/05/2022   Pseudogout    Stroke (HCC) 10/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   Tamponade 06/18/2022   Tobacco abuse    Past Surgical History:  Procedure Laterality Date   ANTERIOR CRUCIATE  LIGAMENT REPAIR Right    AV FISTULA PLACEMENT Left 07/01/2022   Procedure: LEFT ARTERIOVENOUS (AV) FISTULA CREATION;  Surgeon: Eliza Lonni RAMAN, MD;  Location: [facility] OR;  Service: Vascular;  Laterality: Left;  with regional block   BRONCHIAL BIOPSY  12/18/2021   Procedure: BRONCHIAL BIOPSIES;  Surgeon: Brenna Adine CROME, DO;  Location: [facility] ENDOSCOPY;  Service: Pulmonary;;   BRONCHIAL BRUSHINGS  12/18/2021   Procedure: BRONCHIAL BRUSHINGS;  Surgeon: Brenna Adine CROME, DO;  Location: [facility];  Service: Pulmonary;;   BRONCHIAL NEEDLE ASPIRATION BIOPSY  12/18/2021   Procedure: BRONCHIAL NEEDLE ASPIRATION BIOPSIES;  Surgeon: Brenna Adine CROME, DO;  Location: [facility];  Service: Pulmonary;;   CATARACT EXTRACTION     CHEST TUBE INSERTION Right 06/18/2022   Procedure: CHEST TUBE INSERTION;  Surgeon: Obadiah Coy, MD;  Location: [facility] OR;  Service: Thoracic;  Laterality: Right;   EXCHANGE OF A DIALYSIS CATHETER N/A 08/08/2022   Procedure: EXCHANGE OF A TUNNELED DIALYSIS CATHETER;  Surgeon: Serene Gaile ORN, MD;  Location: [facility] OR;  Service: Vascular;  Laterality: N/A;   FIDUCIAL MARKER PLACEMENT  12/18/2021   Procedure: FIDUCIAL MARKER PLACEMENT;  Surgeon: Brenna Adine CROME, DO;  Location: [facility];  Service: Pulmonary;;   IR FLUORO GUIDE CV LINE RIGHT  06/26/2022   IR US  GUIDE VASC ACCESS RIGHT  06/26/2022   OVARY SURGERY     RIGHT OOPHORECTOMY Right    SMALL INTESTINE SURGERY     SUBXYPHOID PERICARDIAL WINDOW N/A 06/18/2022   Procedure: SUBXYPHOID PERICARDIAL WINDOW;  Surgeon: Obadiah Coy, MD;  Location: [facility] OR;  Service: Thoracic;  Laterality: N/A;   TEE WITHOUT CARDIOVERSION N/A 06/18/2022   Procedure: TRANSESOPHAGEAL ECHOCARDIOGRAM (TEE);  Surgeon: Obadiah Coy, MD;  Location: [facility] OR;  Service: Thoracic;  Laterality: N/A;   TUBAL LIGATION     VIDEO BRONCHOSCOPY WITH RADIAL ENDOBRONCHIAL ULTRASOUND  12/18/2021   Procedure: RADIAL ENDOBRONCHIAL ULTRASOUND;  Surgeon: Brenna Adine CROME, DO;   Location: [facility];  Service: Pulmonary;;   FAMILY HISTORY Family History  Problem Relation Age of Onset   Diabetes Mother    Hypertension Mother    Hypertension Father    SOCIAL HISTORY Social History   Tobacco Use   Smoking status: Every Day    Current packs/day: 0.50    Average packs/day: 0.5 packs/day for 40.0 years (20.0 ttl pk-yrs)    Types: Cigarettes   Smokeless tobacco: Never   Tobacco comments:    Cutting back 1/4-1/2 ppd  Vaping Use   Vaping status: Never Used  Substance Use Topics   Alcohol use: Not Currently   Drug use: Yes    Frequency: 4.0 times per week    Types: Marijuana    Comment: smokes every other day      OPHTHALMIC EXAM:  Not recorded    IMAGING AND PROCEDURES  Imaging and Procedures for 08/24/2024  ASSESSMENT/PLAN: No diagnosis found.  1,2 Severe Non-proliferative diabetic retinopathy, both eyes  - A1C 7.8 (08.13.24), 10.4 (05.09.24), 6.7 (11.30.23) - s/p IVA OD #1 (05.02.24), #2 (05.30.24), #3 (07.24.24), #4 (08.21.24), #5 (10.08.24), #6 (11.05.24), #7 (12.10.24), #8 (01.14.25), #9 (02.18.25), #10 (03.25.25), #11 (04.29.25), #12 (06.03.25) - s/p IVA OS #1 (04.04.24), #2 (05.02.24), #3 (05.30.24), #4 (07.24.24), #5 (08.21.24)  -- IVA resistance OS ====================== - s/p IVE OD #1 (07.23.25), #2 (08.19.25), #3 (09.24.25), #4 (10.29.25), #5 (11.11.25), $6 (12.16.25) - s/p IVE OS #1 (10.08.24), #2 (11.05.24), #3 (12.10.24), #4 (01.14.25), #5 (02.18.25), #6 (03.25.25), #7 (04.29.25), #8 (06.03.25), #9 (07.23.25), #10 (08.19.25), #11 (09.24.25), #12 (10.29.25), #13 (11.11.25), #14 (12.16.25) - FA (04.04.24) shows leaking MA greatest posteriorly, no NV OU - BCVA OD 20/20 from 20/25, OS 20/25 from 20/30 - exam shows scattered MA/DBH and exudates OU (OS > OD) - OCT shows OD: Interval improvement in IRF greatest superior mac--just trace cystic changes remain; OS: mild interval improvement in IRF/IRHM IT macula at 5 weeks -  recommend IVE OD #7 and IVE OS #15 today, 01.27.26 for DME w/ f/u ext to 6 wks again - pt wishes to proceed - RBA of procedure discussed, questions answered - Eylea  approved with insurance - IVA informed consent obtained and signed, 04.04.24 (OU) - IVE informed consent obtained and signed, 10.08.24 (OU) - see procedure note - f/u in 6 wks -- DFE/OCT, possible injection  3,4. Hypertensive retinopathy OU - discussed importance of tight BP control - monitor - pt reports baseline BP 160's/60's  5. Pseudophakia OU  - s/p CE/IOL OU (Dr. Octavia, OD: 2017, OS: 03.04.25)  - IOL in good position, doing well  - monitor  Ophthalmic Meds Ordered this visit:  No orders of the defined types were placed in this encounter.   No follow-ups on file.  There are no Patient Instructions on file for this visit.  Explained the diagnoses, plan, and follow up with the patient and they expressed understanding.  Patient expressed understanding of the importance of proper follow up care.   This document serves as a record of services personally performed by Redell JUDITHANN Hans, MD, PhD. It was created on their behalf by Wanda GEANNIE Keens, COT an ophthalmic technician. The creation of this record is the provider's dictation and/or activities during the visit.    Electronically signed by:  Wanda GEANNIE Keens, COT  08/17/24 7:10 AM   Redell JUDITHANN Hans, M.D., Ph.D. Diseases & Surgery of the Retina and Vitreous [facility]   Abbreviations: M myopia (nearsighted); A astigmatism; H hyperopia (farsighted); P presbyopia; Mrx spectacle prescription;  CTL contact lenses; OD right eye; OS left eye; OU both eyes  XT exotropia; ET esotropia; PEK punctate epithelial keratitis; PEE punctate epithelial erosions; DES dry eye syndrome; MGD meibomian gland dysfunction; ATs artificial tears; PFAT's preservative free artificial tears; NSC nuclear sclerotic cataract; PSC posterior subcapsular cataract; ERM  epi-retinal membrane; PVD posterior vitreous detachment; RD retinal detachment; DM diabetes mellitus; DR diabetic retinopathy; NPDR non-proliferative diabetic retinopathy; PDR proliferative diabetic retinopathy; CSME clinically significant macular edema; DME diabetic macular edema; dbh dot blot hemorrhages; CWS cotton wool spot; POAG primary open angle glaucoma; C/D cup-to-disc ratio; HVF humphrey visual field; GVF goldmann visual field; OCT optical coherence tomography; IOP intraocular pressure; BRVO Branch retinal vein occlusion; CRVO central retinal vein occlusion; CRAO central retinal artery occlusion; BRAO branch retinal artery occlusion; RT retinal tear; SB scleral buckle; PPV pars plana vitrectomy; VH Vitreous hemorrhage; PRP panretinal  laser photocoagulation; IVK intravitreal kenalog; VMT vitreomacular traction; MH Macular hole;  NVD neovascularization of the disc; NVE neovascularization elsewhere; AREDS age related eye disease study; ARMD age related macular degeneration; POAG primary open angle glaucoma; EBMD epithelial/anterior basement membrane dystrophy; ACIOL anterior chamber intraocular lens; IOL intraocular lens; PCIOL posterior chamber intraocular lens; Phaco/IOL phacoemulsification with intraocular lens placement; PRK photorefractive keratectomy; LASIK laser assisted in situ keratomileusis; HTN hypertension; DM diabetes mellitus; COPD chronic obstructive pulmonary disease

## 2024-08-24 ENCOUNTER — Encounter (INDEPENDENT_AMBULATORY_CARE_PROVIDER_SITE_OTHER): Admitting: Ophthalmology

## 2024-08-24 DIAGNOSIS — Z794 Long term (current) use of insulin: Secondary | ICD-10-CM

## 2024-08-24 DIAGNOSIS — E113413 Type 2 diabetes mellitus with severe nonproliferative diabetic retinopathy with macular edema, bilateral: Secondary | ICD-10-CM

## 2024-08-24 DIAGNOSIS — H35033 Hypertensive retinopathy, bilateral: Secondary | ICD-10-CM

## 2024-08-24 DIAGNOSIS — I10 Essential (primary) hypertension: Secondary | ICD-10-CM

## 2024-08-24 DIAGNOSIS — Z961 Presence of intraocular lens: Secondary | ICD-10-CM

## 2024-09-01 NOTE — Progress Notes (Shared)
 [facility] - Clinic Note  09/02/2024     CHIEF COMPLAINT Patient presents for No chief complaint on file.   HISTORY OF PRESENT ILLNESS: Amber Cross is a 64 y.o. female who presents to the clinic today for:    Pt states seeing black spots/floaters.   Referring physician: Clem Brands, PA-C  [facility] [address] [place],  [place] [address]  HISTORICAL INFORMATION:  Selected notes from the MEDICAL RECORD NUMBER Referred by Clem Brands, PA-C for concern of CME / PDR LEE:  Ocular Hx- PMH-   CURRENT MEDICATIONS: No current outpatient medications on file. (Ophthalmic Drugs)   No current facility-administered medications for this visit. (Ophthalmic Drugs)   Current Outpatient Medications (Other)  Medication Sig   amlodipine -olmesartan  (AZOR ) 10-20 MG tablet Take 1 tablet by mouth at bedtime.   atorvastatin  (LIPITOR ) 80 MG tablet Take 1 tablet (80 mg total) by mouth daily.   Calcium  Acetate 668 (169 Ca) MG TABS Take 1 tablet by mouth 3 (three) times daily.   cefdinir  (OMNICEF ) 300 MG capsule Take 1 capsule (300 mg total) by mouth daily.   cephALEXin  (KEFLEX ) 250 MG capsule Take 1 capsule (250 mg total) by mouth daily. (Patient not taking: Reported on 06/08/2024)   Doxercalciferol  (HECTOROL  IV) Take 3 mcg by mouth 3 (three) times a week.   dronabinol  (MARINOL ) 10 MG capsule Take 1 capsule (10 mg total) by mouth daily before lunch. (Patient not taking: Reported on 06/08/2024)   dronabinol  (MARINOL ) 5 MG capsule Take 1 capsule (5 mg total) by mouth 2 (two) times daily before a meal.   fluticasone  (FLONASE ) 50 MCG/ACT nasal spray SPRAY 2 SPRAYS INTO EACH NOSTRIL EVERY DAY   furosemide  (LASIX ) 40 MG tablet Take 1 tablet (40 mg total) by mouth daily as needed (swelling in legs or weight gain). (Patient not taking: Reported on 06/08/2024)   gabapentin  (NEURONTIN ) 100 MG capsule Take 1 capsule (100 mg total) by mouth daily.    hydrALAZINE  (APRESOLINE ) 100 MG tablet Take 1 tablet (100 mg total) by mouth 2 (two) times daily.   Lancet Devices (ADJUSTABLE LANCING DEVICE) MISC    megestrol  (MEGACE ) 400 MG/10ML suspension Take 10 mLs (400 mg total) by mouth 2 (two) times daily.   Methoxy PEG-Epoetin  Beta (MIRCERA IJ) 75 mcg.   Methoxy PEG-Epoetin  Beta (MIRCERA IJ) 60 mcg.   OLANZapine  (ZYPREXA ) 10 MG tablet TAKE 1 TABLET BY MOUTH EVERYDAY AT BEDTIME   ondansetron  (ZOFRAN ) 8 MG tablet Take 1 tablet (8 mg total) by mouth every 8 (eight) hours as needed for nausea or vomiting. (Patient not taking: Reported on 06/08/2024)   pantoprazole  (PROTONIX ) 40 MG tablet Take 40 mg by mouth daily.   predniSONE  (DELTASONE ) 10 MG tablet Take 1 tablet (10 mg total) by mouth daily with breakfast.   sevelamer  carbonate (RENVELA ) 800 MG tablet Take 800 mg by mouth 3 (three) times daily with meals.   traZODone  (DESYREL ) 50 MG tablet TAKE 1 TABLET BY MOUTH EVERYDAY AT BEDTIME   No current facility-administered medications for this visit. (Other)   REVIEW OF SYSTEMS:    ALLERGIES Allergies  Allergen Reactions   Penicillins Itching and Other (See Comments)    redness   Strawberry (Diagnostic) Itching and Swelling   Tomato Itching and Swelling   PAST MEDICAL HISTORY Past Medical History:  Diagnosis Date   Anemia    Anemia of chronic renal failure, stage 4 (severe) (HCC) 07/08/2023   Arthritis  Chest pain 05/07/2021   CHF (congestive heart failure) (HCC)    Chronic kidney disease    dialysis M-W-F   Depression    Diabetes mellitus    Headache    Hypercholesteremia    Hypertension    Hypertensive emergency 06/16/2022   Lung cancer (HCC) 12/18/2021   RUL small cell carcinoma, s/p radiation   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   Postop check 08/05/2022   Pseudogout    Stroke (HCC) 10/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   Tamponade 06/18/2022   Tobacco abuse    Past Surgical History:  Procedure Laterality Date   ANTERIOR CRUCIATE  LIGAMENT REPAIR Right    AV FISTULA PLACEMENT Left 07/01/2022   Procedure: LEFT ARTERIOVENOUS (AV) FISTULA CREATION;  Surgeon: Eliza Lonni RAMAN, MD;  Location: [facility] OR;  Service: Vascular;  Laterality: Left;  with regional block   BRONCHIAL BIOPSY  12/18/2021   Procedure: BRONCHIAL BIOPSIES;  Surgeon: Brenna Adine CROME, DO;  Location: [facility]COPY;  Service: Pulmonary;;   BRONCHIAL BRUSHINGS  12/18/2021   Procedure: BRONCHIAL BRUSHINGS;  Surgeon: Brenna Adine CROME, DO;  Location: [facility];  Service: Pulmonary;;   BRONCHIAL NEEDLE ASPIRATION BIOPSY  12/18/2021   Procedure: BRONCHIAL NEEDLE ASPIRATION BIOPSIES;  Surgeon: Brenna Adine CROME, DO;  Location: [facility];  Service: Pulmonary;;   CATARACT EXTRACTION     CHEST TUBE INSERTION Right 06/18/2022   Procedure: CHEST TUBE INSERTION;  Surgeon: Obadiah Coy, MD;  Location: [facility] OR;  Service: Thoracic;  Laterality: Right;   EXCHANGE OF A DIALYSIS CATHETER N/A 08/08/2022   Procedure: EXCHANGE OF A TUNNELED DIALYSIS CATHETER;  Surgeon: Serene Gaile ORN, MD;  Location: [facility] OR;  Service: Vascular;  Laterality: N/A;   FIDUCIAL MARKER PLACEMENT  12/18/2021   Procedure: FIDUCIAL MARKER PLACEMENT;  Surgeon: Brenna Adine CROME, DO;  Location: [facility];  Service: Pulmonary;;   IR FLUORO GUIDE CV LINE RIGHT  06/26/2022   IR US  GUIDE VASC ACCESS RIGHT  06/26/2022   OVARY SURGERY     RIGHT OOPHORECTOMY Right    SMALL INTESTINE SURGERY     SUBXYPHOID PERICARDIAL WINDOW N/A 06/18/2022   Procedure: SUBXYPHOID PERICARDIAL WINDOW;  Surgeon: Obadiah Coy, MD;  Location: [facility] OR;  Service: Thoracic;  Laterality: N/A;   TEE WITHOUT CARDIOVERSION N/A 06/18/2022   Procedure: TRANSESOPHAGEAL ECHOCARDIOGRAM (TEE);  Surgeon: Obadiah Coy, MD;  Location: [facility] OR;  Service: Thoracic;  Laterality: N/A;   TUBAL LIGATION     VIDEO BRONCHOSCOPY WITH RADIAL ENDOBRONCHIAL ULTRASOUND  12/18/2021   Procedure: RADIAL ENDOBRONCHIAL ULTRASOUND;  Surgeon: Brenna Adine CROME, DO;   Location: [facility];  Service: Pulmonary;;   FAMILY HISTORY Family History  Problem Relation Age of Onset   Diabetes Mother    Hypertension Mother    Hypertension Father    SOCIAL HISTORY Social History   Tobacco Use   Smoking status: Every Day    Current packs/day: 0.50    Average packs/day: 0.5 packs/day for 40.0 years (20.0 ttl pk-yrs)    Types: Cigarettes   Smokeless tobacco: Never   Tobacco comments:    Cutting back 1/4-1/2 ppd  Vaping Use   Vaping status: Never Used  Substance Use Topics   Alcohol use: Not Currently   Drug use: Yes    Frequency: 4.0 times per week    Types: Marijuana    Comment: smokes every other day      OPHTHALMIC EXAM:  Not recorded    IMAGING AND PROCEDURES  Imaging and Procedures for 09/02/2024  ASSESSMENT/PLAN:   ICD-10-CM   1. Severe nonproliferative diabetic retinopathy of both eyes with macular edema associated with type 2 diabetes mellitus (HCC)  Z88.6586     2. Current use of insulin  (HCC)  Z79.4     3. Essential hypertension  I10     4. Hypertensive retinopathy of both eyes  H35.033     5. Pseudophakia, both eyes  Z96.1       1,2 Severe Non-proliferative diabetic retinopathy, both eyes  - A1C 7.8 (08.13.24), 10.4 (05.09.24), 6.7 (11.30.23) - s/p IVA OD #1 (05.02.24), #2 (05.30.24), #3 (07.24.24), #4 (08.21.24), #5 (10.08.24), #6 (11.05.24), #7 (12.10.24), #8 (01.14.25), #9 (02.18.25), #10 (03.25.25), #11 (04.29.25), #12 (06.03.25) - s/p IVA OS #1 (04.04.24), #2 (05.02.24), #3 (05.30.24), #4 (07.24.24), #5 (08.21.24)  -- IVA resistance OS ====================== - s/p IVE OD #1 (07.23.25), #2 (08.19.25), #3 (09.24.25), #4 (10.29.25), #5 (11.11.25), $6 (12.16.25) - s/p IVE OS #1 (10.08.24), #2 (11.05.24), #3 (12.10.24), #4 (01.14.25), #5 (02.18.25), #6 (03.25.25), #7 (04.29.25), #8 (06.03.25), #9 (07.23.25), #10 (08.19.25), #11 (09.24.25), #12 (10.29.25), #13 (11.11.25), #14 (12.16.25) - FA (04.04.24) shows  leaking MA greatest posteriorly, no NV OU - BCVA OD 20/20 from 20/25, OS 20/25 from 20/30 - exam shows scattered MA/DBH and exudates OU (OS > OD) - OCT shows OD: Interval improvement in IRF greatest superior mac--just trace cystic changes remain; OS: mild interval improvement in IRF/IRHM IT macula at 5 weeks - recommend IVE OD #7 and IVE OS #15 today, 02.05.26 for DME w/ f/u ext to 6 wks again - pt wishes to proceed - RBA of procedure discussed, questions answered - Eylea  approved with insurance - IVA informed consent obtained and signed, 04.04.24 (OU) - IVE informed consent obtained and signed, 10.08.24 (OU) - see procedure note - f/u in 6 wks -- DFE/OCT, possible injection  3,4. Hypertensive retinopathy OU - discussed importance of tight BP control - monitor - pt reports baseline BP 160's/60's  5. Pseudophakia OU  - s/p CE/IOL OU (Dr. Octavia, OD: 2017, OS: 03.04.25)  - IOL in good position, doing well  - monitor  Ophthalmic Meds Ordered this visit:  No orders of the defined types were placed in this encounter.   No follow-ups on file.  There are no Patient Instructions on file for this visit.  Explained the diagnoses, plan, and follow up with the patient and they expressed understanding.  Patient expressed understanding of the importance of proper follow up care.   This document serves as a record of services personally performed by Redell JUDITHANN Hans, MD, PhD. It was created on their behalf by Avelina Pereyra, COA an ophthalmic technician. The creation of this record is the provider's dictation and/or activities during the visit.   Electronically signed by: Avelina GORMAN Pereyra, COT  09/01/24  7:25 AM   This document serves as a record of services personally performed by Redell JUDITHANN Hans, MD, PhD. It was created on their behalf by Almetta Pesa, an ophthalmic technician. The creation of this record is the provider's dictation and/or activities during the visit.    Electronically  signed by: Almetta Pesa, OA, 09/02/24  7:55 AM   Redell JUDITHANN Hans, M.D., Ph.D. Diseases & Surgery of the Retina and Vitreous [facility]   Abbreviations: M myopia (nearsighted); A astigmatism; H hyperopia (farsighted); P presbyopia; Mrx spectacle prescription;  CTL contact lenses; OD right eye; OS left eye; OU both eyes  XT exotropia; ET esotropia; PEK punctate epithelial keratitis; PEE punctate  epithelial erosions; DES dry eye syndrome; MGD meibomian gland dysfunction; ATs artificial tears; PFAT's preservative free artificial tears; NSC nuclear sclerotic cataract; PSC posterior subcapsular cataract; ERM epi-retinal membrane; PVD posterior vitreous detachment; RD retinal detachment; DM diabetes mellitus; DR diabetic retinopathy; NPDR non-proliferative diabetic retinopathy; PDR proliferative diabetic retinopathy; CSME clinically significant macular edema; DME diabetic macular edema; dbh dot blot hemorrhages; CWS cotton wool spot; POAG primary open angle glaucoma; C/D cup-to-disc ratio; HVF humphrey visual field; GVF goldmann visual field; OCT optical coherence tomography; IOP intraocular pressure; BRVO Branch retinal vein occlusion; CRVO central retinal vein occlusion; CRAO central retinal artery occlusion; BRAO branch retinal artery occlusion; RT retinal tear; SB scleral buckle; PPV pars plana vitrectomy; VH Vitreous hemorrhage; PRP panretinal laser photocoagulation; IVK intravitreal kenalog; VMT vitreomacular traction; MH Macular hole;  NVD neovascularization of the disc; NVE neovascularization elsewhere; AREDS age related eye disease study; ARMD age related macular degeneration; POAG primary open angle glaucoma; EBMD epithelial/anterior basement membrane dystrophy; ACIOL anterior chamber intraocular lens; IOL intraocular lens; PCIOL posterior chamber intraocular lens; Phaco/IOL phacoemulsification with intraocular lens placement; PRK photorefractive keratectomy; LASIK laser  assisted in situ keratomileusis; HTN hypertension; DM diabetes mellitus; COPD chronic obstructive pulmonary disease

## 2024-09-02 ENCOUNTER — Encounter (INDEPENDENT_AMBULATORY_CARE_PROVIDER_SITE_OTHER): Admitting: Ophthalmology

## 2024-09-02 DIAGNOSIS — Z961 Presence of intraocular lens: Secondary | ICD-10-CM

## 2024-09-02 DIAGNOSIS — I10 Essential (primary) hypertension: Secondary | ICD-10-CM

## 2024-09-02 DIAGNOSIS — E113413 Type 2 diabetes mellitus with severe nonproliferative diabetic retinopathy with macular edema, bilateral: Secondary | ICD-10-CM

## 2024-09-02 DIAGNOSIS — H35033 Hypertensive retinopathy, bilateral: Secondary | ICD-10-CM

## 2024-09-02 DIAGNOSIS — Z794 Long term (current) use of insulin: Secondary | ICD-10-CM

## 2024-09-03 ENCOUNTER — Other Ambulatory Visit: Payer: Self-pay | Admitting: Hematology & Oncology

## 2024-09-03 ENCOUNTER — Other Ambulatory Visit: Payer: Self-pay | Admitting: Family Medicine

## 2024-09-03 ENCOUNTER — Other Ambulatory Visit: Payer: Self-pay | Admitting: Family

## 2024-09-03 DIAGNOSIS — J988 Other specified respiratory disorders: Secondary | ICD-10-CM

## 2024-09-03 DIAGNOSIS — E1140 Type 2 diabetes mellitus with diabetic neuropathy, unspecified: Secondary | ICD-10-CM

## 2025-03-15 ENCOUNTER — Ambulatory Visit
